# Patient Record
Sex: Female | Born: 1945 | Race: White | Hispanic: No | Smoking: Former smoker
Health system: Southern US, Community
[De-identification: ages and names within clinical notes are randomized; demographics above are authoritative.]

## PROBLEM LIST (undated history)

## (undated) DIAGNOSIS — G459 Transient cerebral ischemic attack, unspecified: Secondary | ICD-10-CM

## (undated) DIAGNOSIS — J449 Chronic obstructive pulmonary disease, unspecified: Secondary | ICD-10-CM

## (undated) DIAGNOSIS — M1990 Unspecified osteoarthritis, unspecified site: Secondary | ICD-10-CM

## (undated) DIAGNOSIS — E079 Disorder of thyroid, unspecified: Secondary | ICD-10-CM

## (undated) DIAGNOSIS — E119 Type 2 diabetes mellitus without complications: Secondary | ICD-10-CM

## (undated) DIAGNOSIS — E1142 Type 2 diabetes mellitus with diabetic polyneuropathy: Secondary | ICD-10-CM

## (undated) DIAGNOSIS — N289 Disorder of kidney and ureter, unspecified: Secondary | ICD-10-CM

## (undated) DIAGNOSIS — E1140 Type 2 diabetes mellitus with diabetic neuropathy, unspecified: Secondary | ICD-10-CM

## (undated) DIAGNOSIS — N186 End stage renal disease: Secondary | ICD-10-CM

## (undated) DIAGNOSIS — Z9981 Dependence on supplemental oxygen: Secondary | ICD-10-CM

## (undated) DIAGNOSIS — G4730 Sleep apnea, unspecified: Secondary | ICD-10-CM

## (undated) HISTORY — DX: Unspecified osteoarthritis, unspecified site: M19.90

## (undated) HISTORY — DX: Type 2 diabetes mellitus with diabetic polyneuropathy: E11.42

## (undated) HISTORY — DX: Disorder of thyroid, unspecified: E07.9

## (undated) HISTORY — PX: ABDOMINAL HYSTERECTOMY: SHX81

## (undated) HISTORY — PX: RECTOCELE REPAIR: SHX761

## (undated) HISTORY — PX: VAGINAL WOUND CLOSURE / REPAIR: SUR258

## (undated) HISTORY — PX: THYROIDECTOMY: SHX17

---

## 2006-01-26 HISTORY — PX: REPLACEMENT TOTAL KNEE BILATERAL: SUR1225

## 2018-03-25 DIAGNOSIS — N2581 Secondary hyperparathyroidism of renal origin: Secondary | ICD-10-CM | POA: Insufficient documentation

## 2018-03-25 DIAGNOSIS — D631 Anemia in chronic kidney disease: Secondary | ICD-10-CM | POA: Insufficient documentation

## 2018-03-25 DIAGNOSIS — D509 Iron deficiency anemia, unspecified: Secondary | ICD-10-CM | POA: Insufficient documentation

## 2018-03-30 ENCOUNTER — Encounter (HOSPITAL_COMMUNITY): Payer: Self-pay | Admitting: *Deleted

## 2018-03-30 ENCOUNTER — Other Ambulatory Visit: Payer: Self-pay

## 2018-03-30 ENCOUNTER — Emergency Department (HOSPITAL_COMMUNITY)
Admission: EM | Admit: 2018-03-30 | Discharge: 2018-03-30 | Disposition: A | Discharge status: Home / Self Care | Payer: Medicare Other | Attending: Emergency Medicine | Admitting: Emergency Medicine

## 2018-03-30 DIAGNOSIS — Z992 Dependence on renal dialysis: Secondary | ICD-10-CM | POA: Diagnosis not present

## 2018-03-30 DIAGNOSIS — J449 Chronic obstructive pulmonary disease, unspecified: Secondary | ICD-10-CM | POA: Insufficient documentation

## 2018-03-30 DIAGNOSIS — N186 End stage renal disease: Secondary | ICD-10-CM | POA: Insufficient documentation

## 2018-03-30 DIAGNOSIS — E119 Type 2 diabetes mellitus without complications: Secondary | ICD-10-CM | POA: Insufficient documentation

## 2018-03-30 DIAGNOSIS — Z87891 Personal history of nicotine dependence: Secondary | ICD-10-CM | POA: Insufficient documentation

## 2018-03-30 DIAGNOSIS — Z9981 Dependence on supplemental oxygen: Secondary | ICD-10-CM

## 2018-03-30 HISTORY — DX: Type 2 diabetes mellitus without complications: E11.9

## 2018-03-30 HISTORY — DX: Disorder of kidney and ureter, unspecified: N28.9

## 2018-03-30 HISTORY — DX: Dependence on supplemental oxygen: Z99.81

## 2018-03-30 HISTORY — DX: Chronic obstructive pulmonary disease, unspecified: J44.9

## 2018-03-30 LAB — CBC WITH DIFFERENTIAL/PLATELET
Abs Immature Granulocytes: 0.05 10*3/uL (ref 0.00–0.07)
Basophils Absolute: 0 10*3/uL (ref 0.0–0.1)
Basophils Relative: 0 %
Eosinophils Absolute: 0.2 10*3/uL (ref 0.0–0.5)
Eosinophils Relative: 2 %
HCT: 32.9 % — ABNORMAL LOW (ref 36.0–46.0)
Hemoglobin: 10 g/dL — ABNORMAL LOW (ref 12.0–15.0)
Immature Granulocytes: 1 %
Lymphocytes Relative: 14 %
Lymphs Abs: 1.3 10*3/uL (ref 0.7–4.0)
MCH: 31.9 pg (ref 26.0–34.0)
MCHC: 30.4 g/dL (ref 30.0–36.0)
MCV: 105.1 fL — AB (ref 80.0–100.0)
Monocytes Absolute: 0.5 10*3/uL (ref 0.1–1.0)
Monocytes Relative: 5 %
Neutro Abs: 7.2 10*3/uL (ref 1.7–7.7)
Neutrophils Relative %: 78 %
Platelets: 175 10*3/uL (ref 150–400)
RBC: 3.13 MIL/uL — ABNORMAL LOW (ref 3.87–5.11)
RDW: 13.2 % (ref 11.5–15.5)
WBC: 9.2 10*3/uL (ref 4.0–10.5)
nRBC: 0 % (ref 0.0–0.2)

## 2018-03-30 LAB — COMPREHENSIVE METABOLIC PANEL
ALT: 14 U/L (ref 0–44)
AST: 11 U/L — AB (ref 15–41)
Albumin: 3.2 g/dL — ABNORMAL LOW (ref 3.5–5.0)
Alkaline Phosphatase: 128 U/L — ABNORMAL HIGH (ref 38–126)
Anion gap: 16 — ABNORMAL HIGH (ref 5–15)
BUN: 46 mg/dL — ABNORMAL HIGH (ref 8–23)
CO2: 22 mmol/L (ref 22–32)
CREATININE: 8.12 mg/dL — AB (ref 0.44–1.00)
Calcium: 8.4 mg/dL — ABNORMAL LOW (ref 8.9–10.3)
Chloride: 101 mmol/L (ref 98–111)
GFR calc Af Amer: 5 mL/min — ABNORMAL LOW (ref >60)
GFR, EST NON AFRICAN AMERICAN: 4 mL/min — AB (ref >60)
Glucose, Bld: 148 mg/dL — ABNORMAL HIGH (ref 70–99)
Potassium: 4.6 mmol/L (ref 3.5–5.1)
Sodium: 139 mmol/L (ref 135–145)
Total Bilirubin: 0.9 mg/dL (ref 0.3–1.2)
Total Protein: 6.5 g/dL (ref 6.5–8.1)

## 2018-03-30 NOTE — ED Notes (Signed)
The pt is a dialysis pt that just moved  Here from  [place]   She is moving here  She was dialyzed in [place] yesterday.  She has a diakysis catheter in her rt upper chest.  She was sent here  To be seen by the edp for  A referral tO a dialysis   Center????

## 2018-03-30 NOTE — ED Provider Notes (Signed)
[facility] EMERGENCY DEPARTMENT Provider Note   CSN: [id number] Arrival date & time: 03/30/18  1322    History   Chief Complaint Chief Complaint  Patient presents with  . Vascular Access Problem    HPI Joan Mosley is a 73 y.o. female.     Patient with history of diabetes, end-stage renal disease, and COPD on home oxygen.  She presents with complaints of a need for dialysis.  Patient moved here from [place] this morning.  She tells me she has an appointment tomorrow to go to [facility]  for her dialysis, but was told she needed to be seen by a physician first.  She presents here for screening and evaluation.  She has no specific complaints.  Her last dialysis was Monday in [place].  The history is provided by the patient.    Past Medical History:  Diagnosis Date  . COPD (chronic obstructive pulmonary disease) ([place])   . Diabetes mellitus without complication ([place])   . Oxygen dependent 03/30/2018   PT uses O2  all the time  . Renal disorder    ESRD    There are no active problems to display for this patient.   History reviewed. No pertinent surgical history.   OB History   No obstetric history on file.      Home Medications    Prior to Admission medications   Not on File    Family History History reviewed. No pertinent family history.  Social History Social History   Tobacco Use  . Smoking status: Former Smoker    Last attempt to quit: 03/29/1981    Years since quitting: 37.0  . Smokeless tobacco: Never Used  Substance Use Topics  . Alcohol use: Never    Frequency: Never  . Drug use: Never     Allergies   Avelox [moxifloxacin hcl in nacl]; Codeine; Other; Penicillins; and Sulfa antibiotics   Review of Systems Review of Systems  All other systems reviewed and are negative.    Physical Exam Updated Vital Signs BP (!) 119/100 (BP Location: Right Arm)   Pulse 84   Temp 98.1 F (36.7 C) (Oral)    Resp 20   Ht 5' 1.25" (1.556 m)   Wt 110.2 kg   SpO2 100%   BMI 45.54 kg/m   Physical Exam Vitals signs and nursing note reviewed.  Constitutional:      General: She is not in acute distress.    Appearance: She is well-developed. She is not diaphoretic.  HENT:     Head: Normocephalic and atraumatic.  Neck:     Musculoskeletal: Normal range of motion and neck supple.  Cardiovascular:     Rate and Rhythm: Normal rate and regular rhythm.     Heart sounds: No murmur. No friction rub. No gallop.   Pulmonary:     Effort: Pulmonary effort is normal. No respiratory distress.     Breath sounds: Normal breath sounds. No wheezing.  Abdominal:     General: Bowel sounds are normal. There is no distension.     Palpations: Abdomen is soft.     Tenderness: There is no abdominal tenderness.  Musculoskeletal: Normal range of motion.  Skin:    General: Skin is warm and dry.  Neurological:     Mental Status: She is alert and oriented to person, place, and time.      ED Treatments / Results  Labs (all labs ordered are listed, but only abnormal results are displayed) Labs  Reviewed  CBC WITH DIFFERENTIAL/PLATELET - Abnormal; Notable for the following components:      Result Value   RBC 3.13 (*)    Hemoglobin 10.0 (*)    HCT 32.9 (*)    MCV 105.1 (*)    All other components within normal limits  COMPREHENSIVE METABOLIC PANEL - Abnormal; Notable for the following components:   Glucose, Bld 148 (*)    BUN 46 (*)    Creatinine, Ser 8.12 (*)    Calcium 8.4 (*)    Albumin 3.2 (*)    AST 11 (*)    Alkaline Phosphatase 128 (*)    GFR calc non Af Amer 4 (*)    GFR calc Af Amer 5 (*)    Anion gap 16 (*)    All other components within normal limits    EKG None  Radiology No results found.  Procedures Procedures (including critical care time)  Medications Ordered in ED Medications - No data to display   Initial Impression / Assessment and Plan / ED Course  I have reviewed the  triage vital signs and the nursing notes.  Pertinent labs & imaging results that were available during my care of the patient were reviewed by me and considered in my medical decision making (see chart for details).  Patient presents here to establish dialysis care.  She does not appear to be in need of emergent dialysis.  Potassium is normal and she does not appear to be volume overloaded on exam.  The patient has no complaints.  I spoke with Dr. Augustin Coupe from nephrology.  He called and spoke with the medical director of the for [facility] dialysis center.  He tells me that they are expecting her.  She should call in the morning between 630 and 7 to get a time set up for her to come in.  Final Clinical Impressions(s) / ED Diagnoses   Final diagnoses:  None    ED Discharge Orders    None       Veryl Speak, MD 03/30/18 1753

## 2018-03-30 NOTE — Discharge Instructions (Addendum)
Call the [facility] dialysis center in the morning between 630 and 7 AM to arrange a time for your dialysis.

## 2018-03-30 NOTE — ED Triage Notes (Signed)
PT moved new to area from the [place]. Pt last  Dialysis was on Monday before Move to [facility]

## 2018-03-30 NOTE — ED Notes (Signed)
Pt alert oriented skin warm and dry  No distress.

## 2018-03-30 NOTE — ED Triage Notes (Signed)
PLEASE CALL PT'S SON ERIC @ [phone]

## 2018-03-31 DIAGNOSIS — D689 Coagulation defect, unspecified: Secondary | ICD-10-CM | POA: Insufficient documentation

## 2018-04-05 DIAGNOSIS — R52 Pain, unspecified: Secondary | ICD-10-CM | POA: Insufficient documentation

## 2018-04-05 DIAGNOSIS — L299 Pruritus, unspecified: Secondary | ICD-10-CM | POA: Insufficient documentation

## 2018-04-05 DIAGNOSIS — R0602 Shortness of breath: Secondary | ICD-10-CM | POA: Insufficient documentation

## 2018-04-05 DIAGNOSIS — I5032 Chronic diastolic (congestive) heart failure: Secondary | ICD-10-CM | POA: Insufficient documentation

## 2018-04-05 DIAGNOSIS — I639 Cerebral infarction, unspecified: Secondary | ICD-10-CM | POA: Insufficient documentation

## 2018-04-05 DIAGNOSIS — R197 Diarrhea, unspecified: Secondary | ICD-10-CM | POA: Insufficient documentation

## 2018-04-08 ENCOUNTER — Ambulatory Visit: Payer: Medicare Other | Admitting: Nurse Practitioner

## 2018-04-13 ENCOUNTER — Ambulatory Visit: Payer: Medicare Other | Admitting: Nurse Practitioner

## 2018-04-19 ENCOUNTER — Emergency Department (HOSPITAL_COMMUNITY): Payer: Medicare Other

## 2018-04-19 ENCOUNTER — Non-Acute Institutional Stay (HOSPITAL_COMMUNITY)
Admission: EM | Admit: 2018-04-19 | Discharge: 2018-04-20 | Disposition: A | Discharge status: Home / Self Care | Payer: Medicare Other | Attending: Internal Medicine | Admitting: Internal Medicine

## 2018-04-19 ENCOUNTER — Other Ambulatory Visit: Payer: Self-pay

## 2018-04-19 ENCOUNTER — Encounter (HOSPITAL_COMMUNITY): Payer: Self-pay | Admitting: Emergency Medicine

## 2018-04-19 DIAGNOSIS — Z992 Dependence on renal dialysis: Secondary | ICD-10-CM | POA: Diagnosis not present

## 2018-04-19 DIAGNOSIS — J449 Chronic obstructive pulmonary disease, unspecified: Secondary | ICD-10-CM | POA: Insufficient documentation

## 2018-04-19 DIAGNOSIS — Z880 Allergy status to penicillin: Secondary | ICD-10-CM | POA: Insufficient documentation

## 2018-04-19 DIAGNOSIS — N186 End stage renal disease: Secondary | ICD-10-CM | POA: Diagnosis not present

## 2018-04-19 DIAGNOSIS — E875 Hyperkalemia: Secondary | ICD-10-CM | POA: Diagnosis not present

## 2018-04-19 DIAGNOSIS — Z885 Allergy status to narcotic agent status: Secondary | ICD-10-CM | POA: Insufficient documentation

## 2018-04-19 DIAGNOSIS — R0602 Shortness of breath: Secondary | ICD-10-CM | POA: Diagnosis present

## 2018-04-19 DIAGNOSIS — Z882 Allergy status to sulfonamides status: Secondary | ICD-10-CM | POA: Diagnosis not present

## 2018-04-19 DIAGNOSIS — Z87891 Personal history of nicotine dependence: Secondary | ICD-10-CM | POA: Diagnosis not present

## 2018-04-19 DIAGNOSIS — Z9981 Dependence on supplemental oxygen: Secondary | ICD-10-CM | POA: Insufficient documentation

## 2018-04-19 DIAGNOSIS — E1122 Type 2 diabetes mellitus with diabetic chronic kidney disease: Secondary | ICD-10-CM | POA: Diagnosis not present

## 2018-04-19 DIAGNOSIS — R5381 Other malaise: Secondary | ICD-10-CM

## 2018-04-19 LAB — CBC WITH DIFFERENTIAL/PLATELET
Abs Immature Granulocytes: 0.04 10*3/uL (ref 0.00–0.07)
BASOS ABS: 0 10*3/uL (ref 0.0–0.1)
Basophils Relative: 0 %
EOS PCT: 4 %
Eosinophils Absolute: 0.4 10*3/uL (ref 0.0–0.5)
HEMATOCRIT: 34.4 % — AB (ref 36.0–46.0)
Hemoglobin: 10.6 g/dL — ABNORMAL LOW (ref 12.0–15.0)
Immature Granulocytes: 0 %
Lymphocytes Relative: 23 %
Lymphs Abs: 2.2 10*3/uL (ref 0.7–4.0)
MCH: 32.2 pg (ref 26.0–34.0)
MCHC: 30.8 g/dL (ref 30.0–36.0)
MCV: 104.6 fL — ABNORMAL HIGH (ref 80.0–100.0)
Monocytes Absolute: 0.7 10*3/uL (ref 0.1–1.0)
Monocytes Relative: 8 %
Neutro Abs: 6.2 10*3/uL (ref 1.7–7.7)
Neutrophils Relative %: 65 %
Platelets: 137 10*3/uL — ABNORMAL LOW (ref 150–400)
RBC: 3.29 MIL/uL — ABNORMAL LOW (ref 3.87–5.11)
RDW: 13.5 % (ref 11.5–15.5)
WBC: 9.6 10*3/uL (ref 4.0–10.5)
nRBC: 0 % (ref 0.0–0.2)

## 2018-04-19 LAB — BASIC METABOLIC PANEL
Anion gap: 18 — ABNORMAL HIGH (ref 5–15)
BUN: 53 mg/dL — ABNORMAL HIGH (ref 8–23)
CO2: 23 mmol/L (ref 22–32)
Calcium: 9 mg/dL (ref 8.9–10.3)
Chloride: 97 mmol/L — ABNORMAL LOW (ref 98–111)
Creatinine, Ser: 9.55 mg/dL — ABNORMAL HIGH (ref 0.44–1.00)
GFR calc Af Amer: 4 mL/min — ABNORMAL LOW (ref >60)
GFR calc non Af Amer: 4 mL/min — ABNORMAL LOW (ref >60)
Glucose, Bld: 165 mg/dL — ABNORMAL HIGH (ref 70–99)
Potassium: 6.5 mmol/L (ref 3.5–5.1)
Sodium: 138 mmol/L (ref 135–145)

## 2018-04-19 LAB — TROPONIN I: Troponin I: <0.03 ng/mL (ref <0.03)

## 2018-04-19 LAB — INFLUENZA PANEL BY PCR (TYPE A & B)
Influenza A By PCR: NEGATIVE
Influenza B By PCR: NEGATIVE

## 2018-04-19 MED ORDER — INSULIN ASPART 100 UNIT/ML IV SOLN
5.0000 [IU] | Freq: Once | INTRAVENOUS | Status: AC
  Administered 2018-04-20: 5 [IU] via INTRAVENOUS

## 2018-04-19 MED ORDER — DEXTROSE 50 % IV SOLN
1.0000 | Freq: Once | INTRAVENOUS | Status: AC
  Administered 2018-04-20: 50 mL via INTRAVENOUS
  Filled 2018-04-19: qty 50

## 2018-04-19 NOTE — Discharge Instructions (Signed)
You were seen in the ED for increased shortness of breath and malaise. Your potassium was high and you needed dialysis. It will be important to keep your appointments for dialysis and continue your regular medications. Followup with your doctor and return if any worsening symptoms.

## 2018-04-19 NOTE — ED Provider Notes (Signed)
[facility] EMERGENCY DEPARTMENT Provider Note   CSN: [id number] Arrival date & time: 04/19/18  2128    History   Chief Complaint Chief Complaint  Patient presents with  . Shortness of Breath  . Weakness    HPI Joan Mosley is a 73 y.o. female.  She has a history of COPD and is on 5 L nasal cannula.  She also has end-stage renal disease and has been getting dialysis since last summer through a right tunneled catheter.  She moved here from [place] about 3 weeks ago and has been getting dialysis done here Tuesday Thursday Saturday.  She said she did not go today because she did not feel good.  There was also a car issue where they had a flat tire.  Tonight she started experiencing some heaviness in her chest and felt short of breath.  No cough no fever.  She also said she has been urinating frequently which is new for her     The history is provided by the patient.  Shortness of Breath  Severity:  Moderate Onset quality:  Gradual Timing:  Constant Progression:  Worsening Chronicity:  New Relieved by:  None tried Worsened by:  Nothing Ineffective treatments:  None tried Associated symptoms: chest pain   Associated symptoms: no abdominal pain (distention), no diaphoresis, no fever, no headaches, no neck pain, no rash, no sore throat, no sputum production and no vomiting   Weakness  Associated symptoms: chest pain, dysuria, frequency and shortness of breath   Associated symptoms: no abdominal pain (distention), no fever, no headaches, no nausea and no vomiting     Past Medical History:  Diagnosis Date  . COPD (chronic obstructive pulmonary disease) ([place])   . Diabetes mellitus without complication ([place])   . Oxygen dependent 03/30/2018   PT uses O2  all the time  . Renal disorder    ESRD    There are no active problems to display for this patient.   History reviewed. No pertinent surgical history.   OB History   No obstetric history on file.     Home Medications    Prior to Admission medications   Not on File    Family History No family history on file.  Social History Social History   Tobacco Use  . Smoking status: Former Smoker    Last attempt to quit: 03/29/1981    Years since quitting: 37.0  . Smokeless tobacco: Never Used  Substance Use Topics  . Alcohol use: Never    Frequency: Never  . Drug use: Never     Allergies   Avelox [moxifloxacin hcl in nacl]; Codeine; Other; Penicillins; and Sulfa antibiotics   Review of Systems Review of Systems  Constitutional: Negative for diaphoresis and fever.  HENT: Negative for sore throat.   Eyes: Negative for visual disturbance.  Respiratory: Positive for shortness of breath. Negative for sputum production.   Cardiovascular: Positive for chest pain.  Gastrointestinal: Positive for abdominal distention. Negative for abdominal pain (distention), nausea and vomiting.  Genitourinary: Positive for dysuria and frequency.  Musculoskeletal: Negative for neck pain.  Skin: Negative for rash.  Neurological: Positive for weakness. Negative for headaches.     Physical Exam Updated Vital Signs BP (!) 151/74 (BP Location: Right Arm)   Pulse 85   Temp 98.6 F (37 C) (Oral)   Resp (!) 22   Ht 5\' 1"  (1.549 m)   Wt 112 kg   SpO2 100%   BMI 46.67 kg/m  Physical Exam Vitals signs and nursing note reviewed.  Constitutional:      General: She is not in acute distress.    Appearance: She is well-developed.  HENT:     Head: Normocephalic and atraumatic.  Eyes:     Conjunctiva/sclera: Conjunctivae normal.  Neck:     Musculoskeletal: Neck supple.  Cardiovascular:     Rate and Rhythm: Normal rate and regular rhythm.     Heart sounds: No murmur.  Pulmonary:     Effort: Pulmonary effort is normal. No respiratory distress.     Breath sounds: Normal breath sounds.     Comments: She is a catheter in her right upper chest no surrounding erythema. Abdominal:      Palpations: Abdomen is soft.     Tenderness: There is no abdominal tenderness.  Musculoskeletal:     Right lower leg: Edema present.     Left lower leg: Edema present.  Skin:    General: Skin is warm and dry.     Capillary Refill: Capillary refill takes less than 2 seconds.  Neurological:     General: No focal deficit present.     Mental Status: She is alert and oriented to person, place, and time.      ED Treatments / Results  Labs (all labs ordered are listed, but only abnormal results are displayed) Labs Reviewed  CBC WITH DIFFERENTIAL/PLATELET - Abnormal; Notable for the following components:      Result Value   RBC 3.29 (*)    Hemoglobin 10.6 (*)    HCT 34.4 (*)    MCV 104.6 (*)    Platelets 137 (*)    All other components within normal limits  BASIC METABOLIC PANEL - Abnormal; Notable for the following components:   Potassium 6.5 (*)    Chloride 97 (*)    Glucose, Bld 165 (*)    BUN 53 (*)    Creatinine, Ser 9.55 (*)    GFR calc non Af Amer 4 (*)    GFR calc Af Amer 4 (*)    Anion gap 18 (*)    All other components within normal limits  URINALYSIS, ROUTINE W REFLEX MICROSCOPIC - Abnormal; Notable for the following components:   APPearance HAZY (*)    pH 9.0 (*)    Glucose, UA 150 (*)    Hgb urine dipstick MODERATE (*)    Protein, ur 100 (*)    All other components within normal limits  CULTURE, BLOOD (ROUTINE X 2)  CULTURE, BLOOD (ROUTINE X 2)  URINE CULTURE  TROPONIN I  INFLUENZA PANEL BY PCR (TYPE A & B)  GLUCOSE, RANDOM  CBG MONITORING, ED    EKG EKG Interpretation  Date/Time:  Tuesday April 19 2018 21:35:19 EDT Ventricular Rate:  87 PR Interval:    QRS Duration: 94 QT Interval:  384 QTC Calculation: 462 R Axis:   -15 Text Interpretation:  Sinus rhythm Borderline left axis deviation no prior to compare with Confirmed by Aletta Edouard [phone]) on 04/19/2018 9:59:45 PM Also confirmed by Aletta Edouard [phone]), editor Philomena Doheny [phone])  on  04/20/2018 7:13:26 AM   Radiology Dg Chest Port 1 View  Result Date: 04/19/2018 CLINICAL DATA:  73 year old female with shortness of breath. EXAM: PORTABLE CHEST 1 VIEW COMPARISON:  None. FINDINGS: Portable AP upright view at 2140 hours. Right chest tunneled type dual lumen dialysis catheter in place with tips at the lower SVC and right atrium. Patient is mildly rotated to the right. Large lung volumes.  No overt edema. No pneumothorax. No pleural effusion or consolidation identified. Visualized tracheal air column is within normal limits. Calcified aortic atherosclerosis. Other mediastinal contours are within normal limits. No acute osseous abnormality identified. IMPRESSION: 1. No acute cardiopulmonary abnormality identified. Pulmonary hyperinflation. 2. Right chest dialysis type catheter in place. 3.  Aortic Atherosclerosis (ICD10-I70.0). Electronically Signed   By: Genevie Ann M.D.   On: 04/19/2018 22:07    Procedures .Critical Care Performed by: Hayden Rasmussen, MD Authorized by: Hayden Rasmussen, MD   Critical care provider statement:    Critical care time (minutes):  45   Critical care time was exclusive of:  Separately billable procedures and treating other patients   Critical care was necessary to treat or prevent imminent or life-threatening deterioration of the following conditions:  Metabolic crisis   Critical care was time spent personally by me on the following activities:  Discussions with consultants, evaluation of patient's response to treatment, examination of patient, ordering and performing treatments and interventions, ordering and review of laboratory studies, ordering and review of radiographic studies, pulse oximetry, re-evaluation of patient's condition, obtaining history from patient or surrogate, review of old charts and development of treatment plan with patient or surrogate   I assumed direction of critical care for this patient from another provider in my specialty: no      (including critical care time)  Medications Ordered in ED Medications  Chlorhexidine Gluconate Cloth 2 % PADS 6 each (has no administration in time range)  0.9 %  sodium chloride infusion (has no administration in time range)  0.9 %  sodium chloride infusion (has no administration in time range)  heparin injection 1,000 Units (has no administration in time range)  alteplase (CATHFLO ACTIVASE) injection 2 mg (has no administration in time range)  midodrine (PROAMATINE) 5 MG tablet (has no administration in time range)  clonazePAM (KLONOPIN) 0.5 MG tablet (has no administration in time range)  heparin 1000 UNIT/ML injection (has no administration in time range)  insulin aspart (novoLOG) injection 5 Units (5 Units Intravenous Given 04/20/18 0032)    And  dextrose 50 % solution 50 mL (50 mLs Intravenous Given 04/20/18 0033)  midodrine (PROAMATINE) tablet 10 mg (10 mg Oral Given 04/20/18 0431)     Initial Impression / Assessment and Plan / ED Course  I have reviewed the triage vital signs and the nursing notes.  Pertinent labs & imaging results that were available during my care of the patient were reviewed by me and considered in my medical decision making (see chart for details).  Clinical Course as of Apr 19 1224  Tue Apr 19, 2018  2152 Differential diagnosis includes fluid overload, ACS, pneumothorax, PE.    [MB]  2301 Patient's potassium resulted at 6.5.  She is also requiring more oxygen than her baseline 5 L.  I placed a call into nephrology regarding dialysis.  She is good no EKG changes and holding off on starting on insulin and glucose and waiting to see what nephrology wants to do.   [MB]  2318 Discussed with Dr Johnney Ou nephrology who will have the patient get dialysis tonight.    [MB]    Clinical Course User Index [MB] Hayden Rasmussen, MD       Final Clinical Impressions(s) / ED Diagnoses   Final diagnoses:  Hyperkalemia  Shortness of breath  [facility]    ED  Discharge Orders    None       Hayden Rasmussen, MD 04/20/18  1227  

## 2018-04-19 NOTE — ED Triage Notes (Signed)
Per EMS, Pt from home w/ family.  Missed dialysis, was feeling "bad", there was also some car issues (new to area, might need resources).  [place] 10 [place].  Pt is normally on 5L O2 at home, was 93%.  Also reports urinary frequency w/o pain.  Has a dry cough.  Last dialysis was Saturday.

## 2018-04-20 DIAGNOSIS — E875 Hyperkalemia: Secondary | ICD-10-CM | POA: Diagnosis present

## 2018-04-20 LAB — URINALYSIS, ROUTINE W REFLEX MICROSCOPIC
BILIRUBIN URINE: NEGATIVE
Bacteria, UA: NONE SEEN
Glucose, UA: 150 mg/dL — AB
Ketones, ur: NEGATIVE mg/dL
Leukocytes,Ua: NEGATIVE
Nitrite: NEGATIVE
Protein, ur: 100 mg/dL — AB
SPECIFIC GRAVITY, URINE: 1.009 (ref 1.005–1.030)
pH: 9 — ABNORMAL HIGH (ref 5.0–8.0)

## 2018-04-20 LAB — CBG MONITORING, ED: Glucose-Capillary: 76 mg/dL (ref 70–99)

## 2018-04-20 MED ORDER — MIDODRINE HCL 5 MG PO TABS
10.0000 mg | ORAL_TABLET | ORAL | Status: AC
  Administered 2018-04-20 (×2): 10 mg via ORAL
  Filled 2018-04-20 (×2): qty 2

## 2018-04-20 MED ORDER — SODIUM CHLORIDE 0.9 % IV SOLN: 100.0000 mL | INTRAVENOUS | Status: DC | PRN

## 2018-04-20 MED ORDER — ALTEPLASE 2 MG IJ SOLR: 2.0000 mg | Freq: Once | INTRAMUSCULAR | Status: DC | PRN

## 2018-04-20 MED ORDER — HEPARIN SODIUM (PORCINE) 1000 UNIT/ML DIALYSIS: 1000.0000 [IU] | INTRAMUSCULAR | Status: DC | PRN

## 2018-04-20 MED ORDER — CHLORHEXIDINE GLUCONATE CLOTH 2 % EX PADS: 6.0000 | MEDICATED_PAD | Freq: Every day | CUTANEOUS | Status: DC

## 2018-04-20 MED ORDER — HEPARIN SODIUM (PORCINE) 1000 UNIT/ML IJ SOLN
INTRAMUSCULAR | Status: AC
  Filled 2018-04-20: qty 4

## 2018-04-20 MED ORDER — CLONAZEPAM 0.5 MG PO TABS
ORAL_TABLET | ORAL | Status: AC
  Filled 2018-04-20: qty 2

## 2018-04-20 MED ORDER — MIDODRINE HCL 5 MG PO TABS
ORAL_TABLET | ORAL | Status: AC
  Filled 2018-04-20: qty 2

## 2018-04-20 NOTE — Consult Note (Addendum)
[facility]  Reason for Consultation: hyperkalemia, missed dialysis Requesting Provider: Dr. Melina Copa  HPI: Joan Mosley is an 73 y.o. female with ESRD on HD TTS, COPD on 5L O2 at baseline, DM who is seen for evaluation and management of hyperkalemia and dyspnea in setting of missed dialysis.   Has been on HD since 06/2017 but recently moved to area about 3 weeks ago.  Dialyzes at [facility].  Reports to me that this AM feeling "bad" with loose stools and ride to HD had a flat tire so the combination led to missed dialysis.  She presented to the ED this evening with dyspnea, orthopnea.  Labs showing K 6.5 without EKG changes.  She desires dialysis and if able would like to be discharged after dialysis.  She is afebrile, denies cough to me (per ED RN note reported dry cough), denies myalgias.  Per report prior to entire history being obtained a flu swab was obtained and is pending, but there is low suspicion for etiology other than vol overload in context of missed HD.    Missed 3/19 and 3/24.  EDW 105kg. Post wt 3/21 was 114.2kg.  Has been 4+kg over EDW after each treatment except 3/5 when she was 105.8kg  PMH: Past Medical History:  Diagnosis Date  . COPD (chronic obstructive pulmonary disease) ([place])   . Diabetes mellitus without complication ([place])   . Oxygen dependent 03/30/2018   PT uses O2  all the time  . Renal disorder    ESRD   PSH: History reviewed. No pertinent surgical history.  DIALYSIS: Dialyzes at [facility] on TTS since 03/31/18 EDW 105kg. HD [place] 2/2, Dialyzer 180, 4hrs BFR 400, DFR 800. Heparin none. Access RIJ TDC. mircera 100 q2wks, calcitriol 1 qtx   Past Medical History:  Diagnosis Date  . COPD (chronic obstructive pulmonary disease) ([place])   . Diabetes mellitus without complication ([place])   . Oxygen dependent 03/30/2018   PT uses O2  all the time  . Renal disorder    ESRD    Medications:  I have reviewed the patient's current  medications.  (Not in a hospital admission)   ALLERGIES:   Allergies  Allergen Reactions  . Avelox [Moxifloxacin Hcl In Nacl]     PT does not remember  . Codeine Anaphylaxis    PT does not remember  . Other     PT does not remember  . Penicillins     Rash  . Sulfa Antibiotics     PT does nolt remember    FAM HX: No family history on file.  Social History:   reports that she quit smoking about 37 years ago. She has never used smokeless tobacco. She reports that she does not drink alcohol or use drugs.  ROS: 12 system ROS neg except per HPI above  Blood pressure (!) 151/74, pulse 85, temperature 98.6 F (37 C), temperature source Oral, resp. rate (!) 22, height 5\' 1"  (1.549 m), weight 112 kg, SpO2 100 %. PHYSICAL EXAM: Gen: chronically ill, nontoxic appearing  Eyes: anicteric, EOMI ENT: MMM Neck: JVD to 10 cm at 45 degrees CV:  RRR, no rub Abd: soft, obese, NABS Lungs: rales to 1/3 lung fields, 92% on 5L [place], speaking in full sentences GU: deferred Extr: 1+ pitting edema to knees Neuro: grossly nonfocal Skin: warm and dry   Results for orders placed or performed during the hospital encounter of 04/19/18 (from the past 48 hour(s))  CBC with Differential  Status: Abnormal   Collection Time: 04/19/18  9:39 PM  Result Value Ref Range   WBC 9.6 4.0 - 10.5 K/uL   RBC 3.29 (L) 3.87 - 5.11 MIL/uL   Hemoglobin 10.6 (L) 12.0 - 15.0 g/dL   HCT 34.4 (L) 36.0 - 46.0 %   MCV 104.6 (H) 80.0 - 100.0 fL   MCH 32.2 26.0 - 34.0 pg   MCHC 30.8 30.0 - 36.0 g/dL   RDW 13.5 11.5 - 15.5 %   Platelets 137 (L) 150 - 400 K/uL   nRBC 0.0 0.0 - 0.2 %   Neutrophils Relative % 65 %   Neutro Abs 6.2 1.7 - 7.7 K/uL   Lymphocytes Relative 23 %   Lymphs Abs 2.2 0.7 - 4.0 K/uL   Monocytes Relative 8 %   Monocytes Absolute 0.7 0.1 - 1.0 K/uL   Eosinophils Relative 4 %   Eosinophils Absolute 0.4 0.0 - 0.5 K/uL   Basophils Relative 0 %   Basophils Absolute 0.0 0.0 - 0.1 K/uL   Immature  Granulocytes 0 %   Abs Immature Granulocytes 0.04 0.00 - 0.07 K/uL    Comment: Performed at [facility], [address], [place], [place] [address]  Basic metabolic panel     Status: Abnormal   Collection Time: 04/19/18  9:39 PM  Result Value Ref Range   Sodium 138 135 - 145 mmol/L   Potassium 6.5 (HH) 3.5 - 5.1 mmol/L    Comment: CRITICAL RESULT CALLED TO, READ BACK BY AND VERIFIED WITH: GLOUSTER,J RN 04/19/2018 2234 [place] NO VISIBLE HEMOLYSIS    Chloride 97 (L) 98 - 111 mmol/L   CO2 23 22 - 32 mmol/L   Glucose, Bld 165 (H) 70 - 99 mg/dL   BUN 53 (H) 8 - 23 mg/dL   Creatinine, Ser 9.55 (H) 0.44 - 1.00 mg/dL   Calcium 9.0 8.9 - 10.3 mg/dL   GFR calc non Af Amer 4 (L) >60 mL/min   GFR calc Af Amer 4 (L) >60 mL/min   Anion gap 18 (H) 5 - 15    Comment: Performed at [facility], [place] [address] [place], [place] [address]  Troponin I - Once     Status: None   Collection Time: 04/19/18  9:39 PM  Result Value Ref Range   Troponin I <0.03 <0.03 ng/mL    Comment: Performed at [facility] [address], [place], [place] [address]  Influenza panel by PCR (type A & B)     Status: None   Collection Time: 04/19/18  9:51 PM  Result Value Ref Range   Influenza A By PCR NEGATIVE NEGATIVE   Influenza B By PCR NEGATIVE NEGATIVE    Comment: (NOTE) The Xpert Xpress Flu assay is intended as an aid in the diagnosis of  influenza and should not be used as a sole basis for treatment.  This  assay is FDA approved for nasopharyngeal swab specimens only. Nasal  washings and aspirates are unacceptable for Xpert Xpress Flu testing. Performed at [facility], [place] [address], [place],  [address]     Dg Chest Port 1 View  Result Date: 04/19/2018 CLINICAL DATA:  73 year old female with shortness of breath. EXAM: PORTABLE CHEST 1 VIEW COMPARISON:  None. FINDINGS: Portable AP upright view at 2140 hours. Right chest tunneled type dual lumen dialysis catheter in place  with tips at the lower SVC and right atrium. Patient is mildly rotated to the right.  Large lung volumes. No overt edema. No pneumothorax. No pleural effusion or consolidation identified. Visualized tracheal air column is within normal limits. Calcified aortic atherosclerosis. Other mediastinal contours are within normal limits. No acute osseous abnormality identified. IMPRESSION: 1. No acute cardiopulmonary abnormality identified. Pulmonary hyperinflation. 2. Right chest dialysis type catheter in place. 3.  Aortic Atherosclerosis (ICD10-I70.0). Electronically Signed   By: Genevie Ann M.D.   On: 04/19/2018 22:07    Assessment/Plan **ESRD with hyperkalemia and dyspnea in setting of missed dialysis:  HD tonight 4hrs, UF 4L as tolerated. 2K/2Ca. No heparin  Midodrine 10 preHD and mid tx.   Presumably her dyspnea is secondary to pulmonary edema, low suspicion for viral illness.  Plan is ED to discharge following HD.  She seems to need a more reliable outpt transportation plan.  Work towards EDW in [employer] setting.   **Anemia:  Hb 10.6, no issue  **BMM: no an acute issue; ca ok.   **COPD:  Currently on 5L O2 which is her home dose.    Justin Mend 04/20/2018, 12:15 AM

## 2018-04-21 ENCOUNTER — Telehealth (HOSPITAL_BASED_OUTPATIENT_CLINIC_OR_DEPARTMENT_OTHER): Payer: Self-pay | Admitting: Emergency Medicine

## 2018-04-21 LAB — URINE CULTURE

## 2018-04-23 LAB — CULTURE, BLOOD (ROUTINE X 2)

## 2018-04-24 ENCOUNTER — Telehealth: Payer: Self-pay | Admitting: Emergency Medicine

## 2018-04-24 LAB — CULTURE, BLOOD (ROUTINE X 2)
Culture: NO GROWTH
Special Requests: ADEQUATE

## 2018-04-24 NOTE — Telephone Encounter (Signed)
Post ED Visit - Positive Culture Follow-up  Culture report reviewed by antimicrobial stewardship pharmacist: [place] Team []  Elenor Quinones, Pharm.D. []  Heide Guile, Pharm.D., BCPS AQ-ID []  Parks Neptune, Pharm.D., BCPS []  Alycia Rossetti, Pharm.D., BCPS []  Nara Visa, Pharm.D., BCPS, AAHIVP []  Legrand Como, Pharm.D., BCPS, AAHIVP []  Salome Arnt, PharmD, BCPS []  Johnnette Gourd, PharmD, BCPS []  Hughes Better, PharmD, BCPS [x]  Leeroy Cha, PharmD []  Laqueta Linden, PharmD, BCPS []  Albertina Parr, PharmD  [place] Team []  Leodis Sias, PharmD []  Lindell Spar, PharmD []  Royetta Asal, PharmD []  Graylin Shiver, Rph []  Rema Fendt) Glennon Mac, PharmD []  Arlyn Dunning, PharmD []  Netta Cedars, PharmD []  Dia Sitter, PharmD []  Leone Haven, PharmD []  Gretta Arab, PharmD []  Theodis Shove, PharmD []  Peggyann Juba, PharmD []  Reuel Boom, PharmD   Positive blood culture Likely contaminant, no further patient follow-up is required at this time.  Larene Beach Meral Geissinger 04/24/2018, 4:42 PM

## 2018-04-25 ENCOUNTER — Ambulatory Visit: Payer: Medicare Other | Admitting: Nurse Practitioner

## 2018-05-18 ENCOUNTER — Other Ambulatory Visit: Payer: Self-pay

## 2018-05-18 ENCOUNTER — Emergency Department (HOSPITAL_COMMUNITY): Payer: Medicare Other

## 2018-05-18 ENCOUNTER — Emergency Department (HOSPITAL_COMMUNITY)
Admission: EM | Admit: 2018-05-18 | Discharge: 2018-05-18 | Disposition: A | Discharge status: Home / Self Care | Payer: Medicare Other | Attending: Emergency Medicine | Admitting: Emergency Medicine

## 2018-05-18 DIAGNOSIS — E1122 Type 2 diabetes mellitus with diabetic chronic kidney disease: Secondary | ICD-10-CM | POA: Diagnosis not present

## 2018-05-18 DIAGNOSIS — Z992 Dependence on renal dialysis: Secondary | ICD-10-CM | POA: Diagnosis not present

## 2018-05-18 DIAGNOSIS — N186 End stage renal disease: Secondary | ICD-10-CM | POA: Diagnosis not present

## 2018-05-18 DIAGNOSIS — R05 Cough: Secondary | ICD-10-CM | POA: Insufficient documentation

## 2018-05-18 DIAGNOSIS — J441 Chronic obstructive pulmonary disease with (acute) exacerbation: Secondary | ICD-10-CM | POA: Insufficient documentation

## 2018-05-18 DIAGNOSIS — R0602 Shortness of breath: Secondary | ICD-10-CM | POA: Diagnosis present

## 2018-05-18 LAB — BASIC METABOLIC PANEL
Anion gap: 15 (ref 5–15)
BUN: 27 mg/dL — ABNORMAL HIGH (ref 8–23)
CO2: 26 mmol/L (ref 22–32)
Calcium: 9.6 mg/dL (ref 8.9–10.3)
Chloride: 96 mmol/L — ABNORMAL LOW (ref 98–111)
Creatinine, Ser: 5.19 mg/dL — ABNORMAL HIGH (ref 0.44–1.00)
GFR calc Af Amer: 9 mL/min — ABNORMAL LOW (ref >60)
GFR calc non Af Amer: 8 mL/min — ABNORMAL LOW (ref >60)
Glucose, Bld: 125 mg/dL — ABNORMAL HIGH (ref 70–99)
Potassium: 5.4 mmol/L — ABNORMAL HIGH (ref 3.5–5.1)
Sodium: 137 mmol/L (ref 135–145)

## 2018-05-18 LAB — CBC WITH DIFFERENTIAL/PLATELET
Abs Immature Granulocytes: 0.03 10*3/uL (ref 0.00–0.07)
Basophils Absolute: 0.1 10*3/uL (ref 0.0–0.1)
Basophils Relative: 1 %
Eosinophils Absolute: 0.8 10*3/uL — ABNORMAL HIGH (ref 0.0–0.5)
Eosinophils Relative: 8 %
HCT: 38.6 % (ref 36.0–46.0)
Hemoglobin: 12.1 g/dL (ref 12.0–15.0)
Immature Granulocytes: 0 %
Lymphocytes Relative: 21 %
Lymphs Abs: 2.1 10*3/uL (ref 0.7–4.0)
MCH: 30.8 pg (ref 26.0–34.0)
MCHC: 31.3 g/dL (ref 30.0–36.0)
MCV: 98.2 fL (ref 80.0–100.0)
Monocytes Absolute: 0.8 10*3/uL (ref 0.1–1.0)
Monocytes Relative: 8 %
Neutro Abs: 6.2 10*3/uL (ref 1.7–7.7)
Neutrophils Relative %: 62 %
Platelets: 174 10*3/uL (ref 150–400)
RBC: 3.93 MIL/uL (ref 3.87–5.11)
RDW: 14.4 % (ref 11.5–15.5)
WBC: 9.9 10*3/uL (ref 4.0–10.5)
nRBC: 0 % (ref 0.0–0.2)

## 2018-05-18 MED ORDER — IPRATROPIUM-ALBUTEROL 0.5-2.5 (3) MG/3ML IN SOLN
3.0000 mL | Freq: Once | RESPIRATORY_TRACT | Status: AC
  Administered 2018-05-18: 3 mL via RESPIRATORY_TRACT
  Filled 2018-05-18: qty 3

## 2018-05-18 MED ORDER — METHYLPREDNISOLONE SODIUM SUCC 125 MG IJ SOLR
125.0000 mg | Freq: Once | INTRAMUSCULAR | Status: AC
  Administered 2018-05-18: 125 mg via INTRAVENOUS
  Filled 2018-05-18: qty 2

## 2018-05-18 MED ORDER — PREDNISONE 50 MG PO TABS: 50.0000 mg | ORAL_TABLET | Freq: Every day | ORAL | 0 refills | Status: DC

## 2018-05-18 NOTE — Discharge Instructions (Addendum)
Continue using your inhaler as needed.  Return if your breating is getting worse.

## 2018-05-18 NOTE — ED Triage Notes (Signed)
From home, pt has been experiencing on-going progressing shortness of breath. On 5 L @ home and dialysis schedule of T/Th/Sa. No issues with dialysis yesterday. However pt felt breathing was still becoming difficult tonight so called ems.

## 2018-05-18 NOTE — ED Provider Notes (Signed)
[facility] EMERGENCY DEPARTMENT Provider Note   CSN: [id number] Arrival date & time: 05/18/18  0205    History   Chief Complaint Chief Complaint  Patient presents with  . Shortness of Breath  . Cough    HPI Joan Mosley is a 73 y.o. female.   The history is provided by the patient.  She has a history diabetes, end-stage renal disease on hemodialysis, COPD and comes in because of shortness of breath and cough.  Symptoms are typical of her COPD exacerbations.  She started having a cough 2 days ago and started having difficulty breathing tonight.  Albuterol inhaler at home was giving her partial, temporary relief.  She denies fever, chills, sweats.  She denies any change in her usual generalized body aches.  She denies nausea, vomiting, diarrhea.  She denies fever or chills.  She denies exposure to anybody who has COVID-19.  She denies any recent travel.  Of note, she is normally on oxygen 5 L/min at home.  She did go for her usual dialysis yesterday.  Past Medical History:  Diagnosis Date  . COPD (chronic obstructive pulmonary disease) ([place])   . Diabetes mellitus without complication ([place])   . Oxygen dependent 03/30/2018   PT uses O2  all the time  . Renal disorder    ESRD    Patient Active Problem List   Diagnosis Date Noted  . Hyperkalemia 04/20/2018    No past surgical history on file.   OB History   No obstetric history on file.      Home Medications    Prior to Admission medications   Not on File    Family History No family history on file.  Social History Social History   Tobacco Use  . Smoking status: Former Smoker    Last attempt to quit: 03/29/1981    Years since quitting: 37.1  . Smokeless tobacco: Never Used  Substance Use Topics  . Alcohol use: Never    Frequency: Never  . Drug use: Never     Allergies   Avelox [moxifloxacin hcl in nacl]; Codeine; Other; Penicillins; and Sulfa antibiotics   Review of Systems  Review of Systems  All other systems reviewed and are negative.    Physical Exam Updated Vital Signs BP 124/71   Pulse 86   Temp 98.2 F (36.8 C) (Oral)   Resp 19   SpO2 95%   Physical Exam Vitals signs and nursing note reviewed.    73 year old female, resting comfortably and in no acute distress. Vital signs are normal. Oxygen saturation is 95%, which is normal. Head is normocephalic and atraumatic. PERRLA, EOMI. Oropharynx is clear. Neck is nontender and supple without adenopathy or JVD. Back is nontender and there is no CVA tenderness. Lungs have diminished airflow with scattered wheezes diffusely.  There are no rales or rhonchi. Chest is nontender.  Dialysis access catheters are present in the right subclavian area. Heart has regular rate and rhythm without murmur. Abdomen is soft, flat, nontender without masses or hepatosplenomegaly and peristalsis is normoactive. Extremities have 1+ edema, full range of motion is present. Skin is warm and dry without rash. Neurologic: Mental status is normal, cranial nerves are intact, there are no motor or sensory deficits.  ED Treatments / Results  Labs (all labs ordered are listed, but only abnormal results are displayed) Labs Reviewed  BASIC METABOLIC PANEL  CBC WITH DIFFERENTIAL/PLATELET    EKG EKG Interpretation  Date/Time:  Wednesday May 18 2018  02:14:21 EDT Ventricular Rate:  97 PR Interval:    QRS Duration: 92 QT Interval:  367 QTC Calculation: 467 R Axis:   -13 Text Interpretation:  Sinus rhythm Normal ECG When compared with ECG of 04/19/2018, No significant change was found Confirmed by Delora Fuel ([id number]) on 05/18/2018 2:27:57 AM   Radiology No results found.  Procedures Procedures (including critical care time)  Medications Ordered in ED Medications  ipratropium-albuterol (DUONEB) 0.5-2.5 (3) MG/3ML nebulizer solution 3 mL (has no administration in time range)  methylPREDNISolone sodium succinate  (SOLU-MEDROL) 125 mg/2 mL injection 125 mg (has no administration in time range)     Initial Impression / Assessment and Plan / ED Course  I have reviewed the triage vital signs and the nursing notes.  Pertinent labs & imaging results that were available during my care of the patient were reviewed by me and considered in my medical decision making (see chart for details).  COPD exacerbation.  No red flags to suggest COVID-19 infection.  Old records are reviewed, and she has no relevant past visits.  Will check chest x-ray and give steroids and nebulizer treatment with albuterol and ipratropium.  3:24 AM Chest x-ray shows no evidence of pneumonia.  Labs are reassuring.  She feels better after nebulizer treatment.  On reexam, there is still some wheezing.  She will be given a second nebulizer treatment.  3:56 AM Following second nebulizer treatment, she states that her breathing is back to baseline.  On exam, lungs are clear.  She is discharged with prescription for 5-day course of prednisone, and is referred to pulmonology.  Final Clinical Impressions(s) / ED Diagnoses   Final diagnoses:  COPD exacerbation ([place])  End-stage renal disease on hemodialysis [facility])    ED Discharge Orders         Ordered    Ambulatory referral to Pulmonology     05/18/18 0336    predniSONE (DELTASONE) 50 MG tablet  Daily     05/18/18 4627           Delora Fuel, MD 03/50/09 [phone]

## 2018-06-23 ENCOUNTER — Encounter: Payer: Self-pay | Admitting: Nephrology

## 2018-06-27 ENCOUNTER — Ambulatory Visit: Payer: Medicare Other | Admitting: Nurse Practitioner

## 2018-07-01 ENCOUNTER — Encounter: Payer: Self-pay | Admitting: Nurse Practitioner

## 2018-07-01 ENCOUNTER — Ambulatory Visit (INDEPENDENT_AMBULATORY_CARE_PROVIDER_SITE_OTHER): Payer: Medicare Other | Admitting: Nurse Practitioner

## 2018-07-01 VITALS — BP 134/90 | HR 88 | Temp 98.1°F | Ht 61.25 in | Wt 216.8 lb

## 2018-07-01 DIAGNOSIS — F419 Anxiety disorder, unspecified: Secondary | ICD-10-CM

## 2018-07-01 DIAGNOSIS — E039 Hypothyroidism, unspecified: Secondary | ICD-10-CM

## 2018-07-01 DIAGNOSIS — E1142 Type 2 diabetes mellitus with diabetic polyneuropathy: Secondary | ICD-10-CM

## 2018-07-01 DIAGNOSIS — Z13220 Encounter for screening for lipoid disorders: Secondary | ICD-10-CM

## 2018-07-01 DIAGNOSIS — Z1159 Encounter for screening for other viral diseases: Secondary | ICD-10-CM

## 2018-07-01 DIAGNOSIS — R2681 Unsteadiness on feet: Secondary | ICD-10-CM | POA: Insufficient documentation

## 2018-07-01 DIAGNOSIS — Z992 Dependence on renal dialysis: Secondary | ICD-10-CM

## 2018-07-01 DIAGNOSIS — I953 Hypotension of hemodialysis: Secondary | ICD-10-CM

## 2018-07-01 DIAGNOSIS — Z794 Long term (current) use of insulin: Secondary | ICD-10-CM

## 2018-07-01 DIAGNOSIS — Z789 Other specified health status: Secondary | ICD-10-CM | POA: Insufficient documentation

## 2018-07-01 DIAGNOSIS — Z136 Encounter for screening for cardiovascular disorders: Secondary | ICD-10-CM

## 2018-07-01 DIAGNOSIS — Z7189 Other specified counseling: Secondary | ICD-10-CM

## 2018-07-01 DIAGNOSIS — K5903 Drug induced constipation: Secondary | ICD-10-CM | POA: Insufficient documentation

## 2018-07-01 DIAGNOSIS — N186 End stage renal disease: Secondary | ICD-10-CM | POA: Insufficient documentation

## 2018-07-01 DIAGNOSIS — H9190 Unspecified hearing loss, unspecified ear: Secondary | ICD-10-CM | POA: Insufficient documentation

## 2018-07-01 DIAGNOSIS — Z8673 Personal history of transient ischemic attack (TIA), and cerebral infarction without residual deficits: Secondary | ICD-10-CM

## 2018-07-01 NOTE — Patient Instructions (Addendum)
Use senna for constipation Controlled DM with hgb A1c of 6.1. decrease levemir to 15units daily. Abnormal renal function due to end stage renal failure. Normal lipid panel. Abnormal thyroid function, increased levothyroixine to 158mcg. Abnormal cbc: anemia due to CKD and mild elevation in WBC. Need to repeat labs during 90month f/up appt. Only klonopin 0.5mg  x 15days sent till medication can be confirmed from previous pcp's records.  Sign medical release to get record from previous pcp and nephrology.  Send mychart reading with current sliding scale order.  Will let you know when letter for Utility company is ready. Contact social services about form for wheelchair ramp.

## 2018-07-01 NOTE — Assessment & Plan Note (Signed)
Perm cath- R. chest Dialysis schedule: Tues, Thurs, Sat. By [facility] in [place].

## 2018-07-01 NOTE — Assessment & Plan Note (Signed)
Use of midodrine prescribed by neprologist

## 2018-07-01 NOTE — Progress Notes (Signed)
New Patient Office Visit  Subjective:  Patient ID: Joan Mosley, female    DOB: 1945-02-17  Age: 73 y.o. MRN: [id number]  CC:  Chief Complaint  Patient presents with  . Establish Care    est care/medication refill, letter from [place] due to oxygen use 24/7 and ramp built to the house to send to medicare. FYI--[place],go to dialysis Tue,Thrus,Sat   Accompanied by daughter [name]- lives with her and she is primary caregiver.  HPI Joan Mosley presents for medication refill and establish care. She moved from [place] to [place] 102months ago. She lives with daughter-[name] at this time, which is also her primary caregiver. She is a mother of 5 adult children (5sons and 1daughter), has grand and great-grandchildren. Previous pcp: Oralia Manis, NP with [facility], last seen 01/2018. Previous nephrologist: Dr. Dwyane Dee. 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 Chest perm cath), DM(insulin use), Hypothyroidism, Chronic back and knee pain (s/p bilateral knee replacement), Gait Instability, Anxiety, and Constipation.  Covid test completed 03/2018: negative  CKD-end stage:  (dialysis T,TH, Sat via R. Chest perm cath)  COPD-emphysema: Not followed by pulmonology O2 via nasal cannula dependent Uses only Albuterol prn  Hx of TIA: Reports 2 incidents of TIA over 5years ago, plavix was prescribed at the time. Denies any CVA or MI/CAD.  Hx of CHF: Does not remember last Echo, was not followed by cardiology.  Anxiety: Stable Chronic use of klonopin BID and citalopram Depression screen [facility] 2/9 07/01/2018  Decreased Interest 0  Down, Depressed, Hopeless 0  PHQ - 2 Score 0   MMSE - Mini Mental State Exam 07/01/2018  Orientation to time 5  Orientation to Place 5  Registration 3  Attention/ Calculation  5  Recall 2  Language- name 2 objects 2  Language- repeat 1  Language- follow 3 step command 3  Language- read & follow direction 1  Write a sentence 1  Copy design 1  Total score 29   DM: Not followed by endocrinology AM glucose: 87-100s PM glucose: 120-140s Current use of levemir and Humalog SSI Denies any hypoglycemia.  Hypothyroidism: Current use of synthroid 160mcg.  Constipation: Waxing and waning, onset with use of iron, calcium and phosphate binders. Use of dulcolax daily with some relief. Has BM 1-2x/day with straining. Hx of hemorrhoids and diverticulosis.  Past Medical History:  Diagnosis Date  . Arthritis   . COPD (chronic obstructive pulmonary disease) ([place])   . Diabetes mellitus without complication ([place])   . Oxygen dependent 03/30/2018   PT uses O2  all the time  . Renal disorder    ESRD  . Thyroid disease     Past Surgical History:  Procedure Laterality Date  . ABDOMINAL HYSTERECTOMY    . RECTOCELE REPAIR    . REPLACEMENT TOTAL KNEE BILATERAL Bilateral 2008  . THYROIDECTOMY     80%  . VAGINAL WOUND CLOSURE / REPAIR      Family History  Problem Relation Age of Onset  . Heart disease Mother   . Hypertension Mother   . Heart disease Father   . Hypertension Father     Social History   Socioeconomic History  . Marital status: Widowed    Spouse name: Not on file  . Number of children: 5  . Years of education: Not  on file  . Highest education level: Not on file  Occupational History    Comment: disabled  Social Needs  . Financial resource strain: Not on file  . Food insecurity:    Worry: Not on file    Inability: Not on file  . Transportation needs:    Medical: Not on file    Non-medical: Not on file  Tobacco Use  . Smoking status: Former Smoker    Last attempt to quit: 03/29/1981    Years since quitting: 37.2  . Smokeless tobacco: Never Used  Substance and Sexual Activity  . Alcohol use: Never    Frequency: Never  . Drug use:  Never  . Sexual activity: Not Currently  Lifestyle  . Physical activity:    Days per week: Not on file    Minutes per session: Not on file  . Stress: Not on file  Relationships  . Social connections:    Talks on phone: Not on file    Gets together: Not on file    Attends religious service: Not on file    Active member of club or organization: Not on file    Attends meetings of clubs or organizations: Not on file    Relationship status: Not on file  . Intimate partner violence:    Fear of current or ex partner: Not on file    Emotionally abused: Not on file    Physically abused: Not on file    Forced sexual activity: Not on file  Other Topics Concern  . Not on file  Social History Narrative  . Not on file    ROS Review of Systems  Constitutional: Negative.   HENT: Negative.   Eyes: Negative.   Respiratory: Positive for cough, shortness of breath and wheezing. Negative for stridor.        Chronic per patient  Gastrointestinal: Positive for constipation. Negative for blood in stool, diarrhea, nausea, rectal pain and vomiting.  Endocrine: Negative.   Genitourinary: Negative.   Musculoskeletal: Positive for back pain and gait problem.  Skin: Negative.   Hematological: Negative.   Psychiatric/Behavioral: Positive for agitation and confusion. Negative for decreased concentration, dysphoric mood, hallucinations, sleep disturbance and suicidal ideas. The patient is nervous/anxious. The patient is not hyperactive.    Objective:   Today's Vitals: BP 134/90   Pulse 88   Temp 98.1 F (36.7 C) (Oral)   Ht 5' 1.25" (1.556 m)   Wt 216 lb 12.8 oz (98.3 kg) Comment: wheelchare 57.2 lb  SpO2 97%   BMI 40.63 kg/m   Physical Exam Constitutional:      General: She is not in acute distress.    Appearance: She is obese.  Eyes:     Extraocular Movements: Extraocular movements intact.     Conjunctiva/sclera: Conjunctivae normal.  Neck:     Musculoskeletal: Normal range of motion  and neck supple.  Cardiovascular:     Rate and Rhythm: Normal rate and regular rhythm.     Pulses: Normal pulses.     Heart sounds: Normal heart sounds.  Pulmonary:     Effort: Pulmonary effort is normal.     Breath sounds: Normal breath sounds.  Abdominal:     General: Bowel sounds are normal.     Palpations: Abdomen is soft.     Tenderness: There is no abdominal tenderness.  Musculoskeletal:     Right lower leg: Edema present.     Left lower leg: Edema present.     Comments: Wheelchair bound  Skin:    Findings: No erythema or rash.  Neurological:     Mental Status: She is oriented to person, place, and time.  Psychiatric:        Mood and Affect: Mood normal.        Behavior: Behavior normal.    Assessment & Plan:   Problem List Items Addressed This Visit      Cardiovascular and Mediastinum   Hemodialysis-associated hypotension    Use of midodrine prescribed by neprologist      Relevant Medications   aspirin (BAYER LOW DOSE) 81 MG EC tablet   rosuvastatin (CRESTOR) 40 MG tablet   furosemide (LASIX) 80 MG tablet   midodrine (PROAMATINE) 10 MG tablet   Other Relevant Orders   Comprehensive metabolic panel (Completed)   CBC w/Diff (Completed)   Microalbumin / creatinine urine ratio     Digestive   Drug-induced constipation   Relevant Medications   senna-docusate (SENNA PLUS) 8.6-50 MG tablet     Endocrine   Hypothyroidism   Relevant Medications   levothyroxine (SYNTHROID) 112 MCG tablet   Other Relevant Orders   T4, free   TSH (Completed)   T3, free (Completed)   Microalbumin / creatinine urine ratio   Type 2 diabetes mellitus with diabetic polyneuropathy, with long-term current use of insulin (HCC) - Primary   Relevant Medications   aspirin (BAYER LOW DOSE) 81 MG EC tablet   rosuvastatin (CRESTOR) 40 MG tablet   insulin lispro (HUMALOG) 100 UNIT/ML injection   clonazePAM (KLONOPIN) 0.5 MG tablet   citalopram (CELEXA) 20 MG tablet   insulin detemir  (LEVEMIR) 100 UNIT/ML injection   Other Relevant Orders   Hemoglobin A1c (Completed)   Comprehensive metabolic panel (Completed)   Microalbumin / creatinine urine ratio     Genitourinary   Stage 5 chronic kidney disease on chronic dialysis [facility])    Perm cath- R. chest Dialysis schedule: Tues, Thurs, Sat. By [facility] in [place].      Relevant Orders   Comprehensive metabolic panel (Completed)   CBC w/Diff (Completed)   Microalbumin / creatinine urine ratio     Other   Anxiety   Relevant Medications   clonazePAM (KLONOPIN) 0.5 MG tablet   citalopram (CELEXA) 20 MG tablet   Counseling regarding advanced directives and goals of care    Completed and signed ACP electronically      Dialysis patient [facility])    Perm cath- R. chest Dialysis schedule: Tues, Thurs, Sat. By [facility] in [place].      Relevant Orders   Microalbumin / creatinine urine ratio   Full code status   Gait instability    Due to back and knee pain. No improvement with home PT and OT in past Wheelchair dependent. Unable to use walker. Stands with 2-persons assist.       Other Visit Diagnoses    Encounter for lipid screening for cardiovascular disease       Relevant Orders   Lipid panel (Completed)   Encounter for hepatitis C screening test for low risk patient       Relevant Orders   Hepatitis C Antibody   Prsnl hx of TIA (TIA), and cereb infrc w/o resid deficits       Relevant Medications   clopidogrel (PLAVIX) 75 MG tablet      Outpatient Encounter Medications as of 07/01/2018  Medication Sig  . aspirin (BAYER LOW DOSE) 81 MG EC tablet Take 81 mg by mouth daily. Swallow whole.  Marland Kitchen  calcitRIOL (ROCALTROL) 0.25 MCG capsule Take 0.25 mcg by mouth daily.  . citalopram (CELEXA) 20 MG tablet Take 1 tablet (20 mg total) by mouth daily. AM  . clonazePAM (KLONOPIN) 0.5 MG tablet Take 1 tablet (0.5 mg total) by mouth 2 (two) times daily. 1 AM and 1 Pm  . clopidogrel (PLAVIX) 75 MG  tablet Take 1 tablet (75 mg total) by mouth daily.  . furosemide (LASIX) 80 MG tablet Take 80 mg by mouth. Sunday, Monday,Wed,Friday--in AM  . insulin detemir (LEVEMIR) 100 UNIT/ML injection Inject 0.15 mLs (15 Units total) into the skin daily.  . insulin lispro (HUMALOG) 100 UNIT/ML injection Inject into the skin 3 (three) times daily before meals. Sliding scale  . IPRATROPIUM BROMIDE IN Inhale into the lungs. 0.5mg -3mg  PRN  . levothyroxine (SYNTHROID) 112 MCG tablet Take 1 tablet (112 mcg total) by mouth daily before breakfast. 1 AM  . lidocaine (LIDODERM) 5 % Place 1 patch onto the skin daily. Remove & Discard patch within 12 hours or as directed by MD  . midodrine (PROAMATINE) 10 MG tablet Take 10 mg by mouth 3 (three) times daily. Tue,Thru,Sat  . montelukast (SINGULAIR) 10 MG tablet Take 10 mg by mouth at bedtime. PM  . nystatin (MYCOSTATIN/NYSTOP) powder Apply topically 4 (four) times daily.  Marland Kitchen nystatin cream (MYCOSTATIN) Apply 1 application topically 2 (two) times daily.  Marland Kitchen PRESCRIPTION MEDICATION Sevelamer 3 times daily  . rosuvastatin (CRESTOR) 40 MG tablet Take 40 mg by mouth daily. AM  . SEVELAMER HCL PO Take by mouth. 1600 mg --3 times daily--take additional 1-2 if eats snack  . [DISCONTINUED] citalopram (CELEXA) 20 MG tablet Take 20 mg by mouth daily. AM  . [DISCONTINUED] clonazePAM (KLONOPIN) 1 MG tablet Take 1 mg by mouth 2 (two) times daily. 1 AM and 1 Pm  . [DISCONTINUED] clopidogrel (PLAVIX) 75 MG tablet Take 75 mg by mouth daily.  . [DISCONTINUED] insulin detemir (LEVEMIR) 100 UNIT/ML injection Inject into the skin daily. 30 units in AM  . [DISCONTINUED] levothyroxine (SYNTHROID) 100 MCG tablet Take 100 mcg by mouth daily before breakfast. 1 AM  . senna-docusate (SENNA PLUS) 8.6-50 MG tablet Take 1 tablet by mouth at bedtime.  . [DISCONTINUED] predniSONE (DELTASONE) 50 MG tablet Take 1 tablet (50 mg total) by mouth daily. (Patient not taking: Reported on 07/01/2018)   No  facility-administered encounter medications on file as of 07/01/2018.     Follow-up: Return in about 3 months (around 10/01/2018) for DM and HTN, hyperlipidemia (53mins).   Wilfred Lacy, NP

## 2018-07-01 NOTE — Assessment & Plan Note (Addendum)
Due to back and knee pain. No improvement with home PT and OT in past Wheelchair dependent. Unable to use walker. Stands with 2-persons assist.

## 2018-07-01 NOTE — Assessment & Plan Note (Addendum)
Perm cath- R. chest Dialysis schedule: Tues, Thurs, Sat. By [facility] in [place].

## 2018-07-01 NOTE — Assessment & Plan Note (Signed)
Completed and signed ACP electronically

## 2018-07-04 DIAGNOSIS — F419 Anxiety disorder, unspecified: Secondary | ICD-10-CM | POA: Insufficient documentation

## 2018-07-04 LAB — LIPID PANEL
Cholesterol: 164 mg/dL (ref <200)
HDL: 78 mg/dL (ref >=50)
LDL Cholesterol (Calc): 59 mg/dL (calc)
Non-HDL Cholesterol (Calc): 86 mg/dL (calc) (ref <130)
Total CHOL/HDL Ratio: 2.1 (calc) (ref <5.0)
Triglycerides: 202 mg/dL — ABNORMAL HIGH (ref <150)

## 2018-07-04 LAB — CBC WITH DIFFERENTIAL/PLATELET
Absolute Monocytes: 1115 cells/uL — ABNORMAL HIGH (ref 200–950)
Basophils Absolute: 68 cells/uL (ref 0–200)
Basophils Relative: 0.5 %
Eosinophils Absolute: 313 cells/uL (ref 15–500)
Eosinophils Relative: 2.3 %
HCT: 34.7 % — ABNORMAL LOW (ref 35.0–45.0)
Hemoglobin: 11.5 g/dL — ABNORMAL LOW (ref 11.7–15.5)
Lymphs Abs: 2190 cells/uL (ref 850–3900)
MCH: 31 pg (ref 27.0–33.0)
MCHC: 33.1 g/dL (ref 32.0–36.0)
MCV: 93.5 fL (ref 80.0–100.0)
MPV: 10.8 fL (ref 7.5–12.5)
Monocytes Relative: 8.2 %
Neutro Abs: 9914 cells/uL — ABNORMAL HIGH (ref 1500–7800)
Neutrophils Relative %: 72.9 %
Platelets: 197 10*3/uL (ref 140–400)
RBC: 3.71 10*6/uL — ABNORMAL LOW (ref 3.80–5.10)
RDW: 15.6 % — ABNORMAL HIGH (ref 11.0–15.0)
Total Lymphocyte: 16.1 %
WBC: 13.6 10*3/uL — ABNORMAL HIGH (ref 3.8–10.8)

## 2018-07-04 LAB — HEPATITIS C ANTIBODY
Hepatitis C Ab: NONREACTIVE
SIGNAL TO CUT-OFF: 0.05 (ref <1.00)

## 2018-07-04 LAB — HEMOGLOBIN A1C
Hgb A1c MFr Bld: 6.1 % of total Hgb — ABNORMAL HIGH (ref <5.7)
Mean Plasma Glucose: 128 (calc)
eAG (mmol/L): 7.1 (calc)

## 2018-07-04 LAB — COMPREHENSIVE METABOLIC PANEL
AG Ratio: 1.8 (calc) (ref 1.0–2.5)
ALT: 7 U/L (ref 6–29)
AST: 10 U/L (ref 10–35)
Albumin: 4.2 g/dL (ref 3.6–5.1)
Alkaline phosphatase (APISO): 136 U/L (ref 37–153)
BUN/Creatinine Ratio: 5 (calc) — ABNORMAL LOW (ref 6–22)
BUN: 37 mg/dL — ABNORMAL HIGH (ref 7–25)
CO2: 27 mmol/L (ref 20–32)
Calcium: 9.1 mg/dL (ref 8.6–10.4)
Chloride: 92 mmol/L — ABNORMAL LOW (ref 98–110)
Creat: 7.2 mg/dL — ABNORMAL HIGH (ref 0.60–0.93)
Globulin: 2.4 g/dL (calc) (ref 1.9–3.7)
Glucose, Bld: 89 mg/dL (ref 65–99)
Potassium: 5.7 mmol/L — ABNORMAL HIGH (ref 3.5–5.3)
Sodium: 134 mmol/L — ABNORMAL LOW (ref 135–146)
Total Bilirubin: 0.5 mg/dL (ref 0.2–1.2)
Total Protein: 6.6 g/dL (ref 6.1–8.1)

## 2018-07-04 LAB — MICROALBUMIN / CREATININE URINE RATIO

## 2018-07-04 LAB — TSH: TSH: 18.31 mIU/L — ABNORMAL HIGH (ref 0.40–4.50)

## 2018-07-04 LAB — T3, FREE: T3, Free: 1.8 pg/mL — ABNORMAL LOW (ref 2.3–4.2)

## 2018-07-04 MED ORDER — CLOPIDOGREL BISULFATE 75 MG PO TABS: 75.0000 mg | ORAL_TABLET | Freq: Every day | ORAL | 1 refills | Status: DC

## 2018-07-04 MED ORDER — SENNOSIDES-DOCUSATE SODIUM 8.6-50 MG PO TABS: 1.0000 | ORAL_TABLET | Freq: Every day | ORAL | 1 refills | Status: DC

## 2018-07-04 MED ORDER — LEVOTHYROXINE SODIUM 112 MCG PO TABS: 112.0000 ug | ORAL_TABLET | Freq: Every day | ORAL | 0 refills | Status: DC

## 2018-07-04 MED ORDER — CITALOPRAM HYDROBROMIDE 20 MG PO TABS: 20.0000 mg | ORAL_TABLET | Freq: Every day | ORAL | 1 refills | Status: DC

## 2018-07-04 MED ORDER — CLONAZEPAM 0.5 MG PO TABS: 0.5000 mg | ORAL_TABLET | Freq: Two times a day (BID) | ORAL | 0 refills | Status: DC

## 2018-07-04 MED ORDER — INSULIN DETEMIR 100 UNIT/ML ~~LOC~~ SOLN: 15.0000 [IU] | Freq: Every day | SUBCUTANEOUS | 2 refills | Status: DC

## 2018-07-07 ENCOUNTER — Encounter: Payer: Self-pay | Admitting: Nurse Practitioner

## 2018-07-13 ENCOUNTER — Telehealth: Payer: Self-pay | Admitting: Nurse Practitioner

## 2018-07-13 NOTE — Telephone Encounter (Signed)
Pt is calling and has not pick up clonazepam 0.5 mg. Pt would like clonazepam 1 mg to take twice a day. [facility] [address]

## 2018-07-13 NOTE — Telephone Encounter (Signed)
Spoke with the pt, go over lab result and explain why we sent in 0.5 mg for right now. Pt is aware and bring in records release to the office tomorrow before 4 pm. FYI--pt stated she will pick up 0.5 mg and wait for record to come in--so Baldo Ash can change her dosage.

## 2018-07-13 NOTE — Telephone Encounter (Signed)
Spoke with the pt, she stated she always take 1 mg tab twice daily--and 0.5 mg didn't help with anxiety in the past (pt mention this during last ov). [name] please advise.

## 2018-07-25 ENCOUNTER — Telehealth: Payer: Self-pay | Admitting: Nurse Practitioner

## 2018-07-25 NOTE — Telephone Encounter (Signed)
Patient calling and states that she is needing her clonazePAM (KLONOPIN) 0.5 MG tablet To be changed to a 1MG  tablet. States that she has always been on a 1MG  tablet since moving from [place]. Would like this medication to be changed and a new prescription called into the pharmacy, if possible. Please advise.  [id number] [address], [place] AT [address]

## 2018-07-26 NOTE — Telephone Encounter (Signed)
LVM for the pt to call back, need to know if they drop off the release form yet?   [name] you dont recall get the record for the pt right?

## 2018-07-26 NOTE — Telephone Encounter (Signed)
No I do not have records

## 2018-07-27 NOTE — Telephone Encounter (Signed)
Advise the pt that Joan Mosley needs to see record from previous doctor first before she can change the dosage. Pt stated she will drop it off this week.

## 2018-08-01 ENCOUNTER — Encounter (HOSPITAL_COMMUNITY): Payer: Self-pay | Admitting: Emergency Medicine

## 2018-08-01 ENCOUNTER — Emergency Department (HOSPITAL_COMMUNITY)
Admission: EM | Admit: 2018-08-01 | Discharge: 2018-08-01 | Disposition: A | Discharge status: Home / Self Care | Payer: Medicare Other | Attending: Emergency Medicine | Admitting: Emergency Medicine

## 2018-08-01 ENCOUNTER — Other Ambulatory Visit: Payer: Self-pay

## 2018-08-01 ENCOUNTER — Emergency Department (HOSPITAL_COMMUNITY): Payer: Medicare Other

## 2018-08-01 DIAGNOSIS — E875 Hyperkalemia: Secondary | ICD-10-CM

## 2018-08-01 DIAGNOSIS — E1122 Type 2 diabetes mellitus with diabetic chronic kidney disease: Secondary | ICD-10-CM | POA: Insufficient documentation

## 2018-08-01 DIAGNOSIS — J449 Chronic obstructive pulmonary disease, unspecified: Secondary | ICD-10-CM | POA: Diagnosis not present

## 2018-08-01 DIAGNOSIS — R0602 Shortness of breath: Secondary | ICD-10-CM | POA: Diagnosis not present

## 2018-08-01 DIAGNOSIS — Z87891 Personal history of nicotine dependence: Secondary | ICD-10-CM | POA: Insufficient documentation

## 2018-08-01 DIAGNOSIS — R9431 Abnormal electrocardiogram [ECG] [EKG]: Secondary | ICD-10-CM | POA: Diagnosis not present

## 2018-08-01 DIAGNOSIS — Z794 Long term (current) use of insulin: Secondary | ICD-10-CM | POA: Diagnosis not present

## 2018-08-01 DIAGNOSIS — Z992 Dependence on renal dialysis: Secondary | ICD-10-CM | POA: Insufficient documentation

## 2018-08-01 DIAGNOSIS — Z20828 Contact with and (suspected) exposure to other viral communicable diseases: Secondary | ICD-10-CM | POA: Diagnosis not present

## 2018-08-01 DIAGNOSIS — Z79899 Other long term (current) drug therapy: Secondary | ICD-10-CM | POA: Insufficient documentation

## 2018-08-01 DIAGNOSIS — Z7982 Long term (current) use of aspirin: Secondary | ICD-10-CM | POA: Insufficient documentation

## 2018-08-01 DIAGNOSIS — E039 Hypothyroidism, unspecified: Secondary | ICD-10-CM | POA: Insufficient documentation

## 2018-08-01 DIAGNOSIS — N186 End stage renal disease: Secondary | ICD-10-CM | POA: Insufficient documentation

## 2018-08-01 DIAGNOSIS — Z7902 Long term (current) use of antithrombotics/antiplatelets: Secondary | ICD-10-CM | POA: Insufficient documentation

## 2018-08-01 LAB — RENAL FUNCTION PANEL
Albumin: 3.4 g/dL — ABNORMAL LOW (ref 3.5–5.0)
Anion gap: 19 — ABNORMAL HIGH (ref 5–15)
BUN: 76 mg/dL — ABNORMAL HIGH (ref 8–23)
CO2: 21 mmol/L — ABNORMAL LOW (ref 22–32)
Calcium: 8.5 mg/dL — ABNORMAL LOW (ref 8.9–10.3)
Chloride: 94 mmol/L — ABNORMAL LOW (ref 98–111)
Creatinine, Ser: 10.46 mg/dL — ABNORMAL HIGH (ref 0.44–1.00)
GFR calc Af Amer: 4 mL/min — ABNORMAL LOW (ref >60)
GFR calc non Af Amer: 3 mL/min — ABNORMAL LOW (ref >60)
Glucose, Bld: 125 mg/dL — ABNORMAL HIGH (ref 70–99)
Phosphorus: 11.4 mg/dL — ABNORMAL HIGH (ref 2.5–4.6)
Potassium: 6.4 mmol/L (ref 3.5–5.1)
Sodium: 134 mmol/L — ABNORMAL LOW (ref 135–145)

## 2018-08-01 LAB — CBC WITH DIFFERENTIAL/PLATELET
Abs Immature Granulocytes: 0.06 10*3/uL (ref 0.00–0.07)
Basophils Absolute: 0 10*3/uL (ref 0.0–0.1)
Basophils Relative: 0 %
Eosinophils Absolute: 0.2 10*3/uL (ref 0.0–0.5)
Eosinophils Relative: 2 %
HCT: 35.6 % — ABNORMAL LOW (ref 36.0–46.0)
Hemoglobin: 11.2 g/dL — ABNORMAL LOW (ref 12.0–15.0)
Immature Granulocytes: 1 %
Lymphocytes Relative: 14 %
Lymphs Abs: 1.4 10*3/uL (ref 0.7–4.0)
MCH: 30.1 pg (ref 26.0–34.0)
MCHC: 31.5 g/dL (ref 30.0–36.0)
MCV: 95.7 fL (ref 80.0–100.0)
Monocytes Absolute: 0.5 10*3/uL (ref 0.1–1.0)
Monocytes Relative: 6 %
Neutro Abs: 7.7 10*3/uL (ref 1.7–7.7)
Neutrophils Relative %: 77 %
Platelets: 163 10*3/uL (ref 150–400)
RBC: 3.72 MIL/uL — ABNORMAL LOW (ref 3.87–5.11)
RDW: 16.9 % — ABNORMAL HIGH (ref 11.5–15.5)
WBC: 9.9 10*3/uL (ref 4.0–10.5)
nRBC: 0 % (ref 0.0–0.2)

## 2018-08-01 LAB — POCT I-STAT 7, (LYTES, BLD GAS, ICA,H+H)
Acid-base deficit: 2 mmol/L (ref 0.0–2.0)
Bicarbonate: 22.4 mmol/L (ref 20.0–28.0)
Calcium, Ion: 1.08 mmol/L — ABNORMAL LOW (ref 1.15–1.40)
HCT: 31 % — ABNORMAL LOW (ref 36.0–46.0)
Hemoglobin: 10.5 g/dL — ABNORMAL LOW (ref 12.0–15.0)
O2 Saturation: 86 %
Patient temperature: 98.4
Potassium: 6.2 mmol/L — ABNORMAL HIGH (ref 3.5–5.1)
Sodium: 128 mmol/L — ABNORMAL LOW (ref 135–145)
TCO2: 24 mmol/L (ref 22–32)
pCO2 arterial: 37 mmHg (ref 32.0–48.0)
pH, Arterial: 7.39 (ref 7.350–7.450)
pO2, Arterial: 51 mmHg — ABNORMAL LOW (ref 83.0–108.0)

## 2018-08-01 LAB — SARS CORONAVIRUS 2 BY RT PCR (HOSPITAL ORDER, PERFORMED IN ~~LOC~~ HOSPITAL LAB): SARS Coronavirus 2: NEGATIVE

## 2018-08-01 LAB — CBG MONITORING, ED: Glucose-Capillary: 158 mg/dL — ABNORMAL HIGH (ref 70–99)

## 2018-08-01 MED ORDER — SODIUM ZIRCONIUM CYCLOSILICATE 10 G PO PACK
10.0000 g | PACK | Freq: Once | ORAL | Status: DC
  Filled 2018-08-01: qty 1

## 2018-08-01 MED ORDER — ALBUTEROL SULFATE (2.5 MG/3ML) 0.083% IN NEBU
10.0000 mg | INHALATION_SOLUTION | Freq: Once | RESPIRATORY_TRACT | Status: AC
  Administered 2018-08-01: 10 mg via RESPIRATORY_TRACT
  Filled 2018-08-01: qty 12

## 2018-08-01 MED ORDER — HEPARIN SODIUM (PORCINE) 1000 UNIT/ML IJ SOLN
INTRAMUSCULAR | Status: AC
  Filled 2018-08-01: qty 4

## 2018-08-01 MED ORDER — CHLORHEXIDINE GLUCONATE CLOTH 2 % EX PADS: 6.0000 | MEDICATED_PAD | Freq: Every day | CUTANEOUS | Status: DC

## 2018-08-01 MED ORDER — DEXTROSE 10 % IV SOLN
Freq: Once | INTRAVENOUS | Status: AC
  Administered 2018-08-01: 07:00:00 via INTRAVENOUS

## 2018-08-01 MED ORDER — CLONAZEPAM 0.5 MG PO TABS
0.5000 mg | ORAL_TABLET | Freq: Once | ORAL | Status: AC
  Administered 2018-08-01: 0.5 mg via ORAL
  Filled 2018-08-01: qty 1

## 2018-08-01 MED ORDER — ALBUTEROL (5 MG/ML) CONTINUOUS INHALATION SOLN
10.0000 mg/h | INHALATION_SOLUTION | RESPIRATORY_TRACT | Status: DC
  Administered 2018-08-01: 10 mg/h via RESPIRATORY_TRACT
  Filled 2018-08-01: qty 20

## 2018-08-01 MED ORDER — FUROSEMIDE 10 MG/ML IJ SOLN
40.0000 mg | Freq: Once | INTRAMUSCULAR | Status: AC
  Administered 2018-08-01: 40 mg via INTRAVENOUS
  Filled 2018-08-01: qty 4

## 2018-08-01 NOTE — Progress Notes (Signed)
Renal Navigator notified OP HD clinic[facility] of patient's negative COVID 19 test result to provide continuity of care and safety.  Alphonzo Cruise, [place] Renal Navigator [phone]

## 2018-08-01 NOTE — Discharge Instructions (Addendum)
As discussed, with our nephrology colleagues, it is very important you proceed to your dialysis center now. Return here for concerning changes in your condition.

## 2018-08-01 NOTE — ED Provider Notes (Addendum)
Patient has been seen and evaluated by our nephrology colleagues. As she has missed dialysis recently, has elevated potassium, need is for dialysis, but the patient has a strong preference to go to her own outpatient center, and arrangements have been made, with family and staff to facilitate this. Nephrology recommends discharge with this plan.   8:57 AM Correction, there is nowavailable at her dialysis center and she will stay here for dialysis.   Carmin Muskrat, MD 08/01/18 [phone]

## 2018-08-01 NOTE — ED Notes (Signed)
Ordered diet tray for pt  

## 2018-08-01 NOTE — ED Triage Notes (Signed)
Pt presents to ED from home by [facility]. Pt began feeling SOB today after missing dialysis sat. Pt reports fever last week and cough x1w. Pt wears 5L [place] at home.

## 2018-08-01 NOTE — Progress Notes (Signed)
Pt. Discharged home with family via private vehicle after HD tx pt. Stable pt. States "I feel better"  bp 107/69 HR 94 o2 Sat 98% 5L [place]. Pt. Denies any discomfort or distress. IV access removed x1
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
## 2018-08-01 NOTE — ED Provider Notes (Signed)
[facility] EMERGENCY DEPARTMENT Provider Note  CSN: [id number] Arrival date & time: 08/01/18 0430  Chief Complaint(s) Shortness of Breath  HPI Joan Mosley is a 73 y.o. female with a past medical history listed below including COPD on 5 L nasal cannula at home, ESRD on dialysis TTS who presents to the emergency department with several hours of gradually worsening shortness of breath and feeling like she is volume overloaded.  Patient missed dialysis on Saturday due to car trouble.  She endorses chronic productive cough without acute changes.  No recent fevers or infections.  Mild nausea without emesis.  No abdominal pain.  No diarrhea.  No chest pain.  HPI  Past Medical History Past Medical History:  Diagnosis Date   Arthritis    COPD (chronic obstructive pulmonary disease) ([place])    Diabetes mellitus without complication ([place])    Oxygen dependent 03/30/2018   PT uses O2  all the time   Renal disorder    ESRD   Thyroid disease    Patient Active Problem List   Diagnosis Date Noted   Anxiety 07/04/2018   Counseling regarding advanced directives and goals of care 07/01/2018   Full code status 07/01/2018   Drug-induced constipation 07/01/2018   Hemodialysis-associated hypotension 07/01/2018   Dialysis patient ([place]) 07/01/2018   HOH (hard of hearing) 07/01/2018   Stage 5 chronic kidney disease on chronic dialysis ([place]) 07/01/2018   Type 2 diabetes mellitus with diabetic polyneuropathy, with long-term current use of insulin ([place]) 07/01/2018   Hypothyroidism 07/01/2018   Gait instability 07/01/2018   Hyperkalemia 04/20/2018   Home Medication(s) Prior to Admission medications   Medication Sig Start Date End Date Taking? Authorizing Provider  aspirin (BAYER LOW DOSE) 81 MG EC tablet Take 81 mg by mouth daily. Swallow whole.    [provider]  calcitRIOL (ROCALTROL) 0.25 MCG capsule Take 0.25 mcg by mouth daily.    [provider]  citalopram (CELEXA) 20 MG tablet Take 1 tablet (20 mg total) by mouth daily. AM 07/04/18   Nche, Charlene Brooke, NP  clonazePAM (KLONOPIN) 0.5 MG tablet Take 1 tablet (0.5 mg total) by mouth 2 (two) times daily. 1 AM and 1 Pm 07/04/18   Nche, Charlene Brooke, NP  clopidogrel (PLAVIX) 75 MG tablet Take 1 tablet (75 mg total) by mouth daily. 07/04/18   Nche, Charlene Brooke, NP  furosemide (LASIX) 80 MG tablet Take 80 mg by mouth. Sunday, Monday,Wed,Friday--in AM    [provider]  insulin detemir (LEVEMIR) 100 UNIT/ML injection Inject 0.15 mLs (15 Units total) into the skin daily. 07/04/18   Nche, Charlene Brooke, NP  insulin lispro (HUMALOG) 100 UNIT/ML injection Inject into the skin 3 (three) times daily before meals. Sliding scale    [provider]  IPRATROPIUM BROMIDE IN Inhale into the lungs. 0.5mg -3mg  PRN    [provider]  levothyroxine (SYNTHROID) 112 MCG tablet Take 1 tablet (112 mcg total) by mouth daily before breakfast. 1 AM 07/04/18   Nche, Charlene Brooke, NP  lidocaine (LIDODERM) 5 % Place 1 patch onto the skin daily. Remove & Discard patch within 12 hours or as directed by MD    [provider]  midodrine (PROAMATINE) 10 MG tablet Take 10 mg by mouth 3 (three) times daily. Tue,Thru,Sat    [provider]  montelukast (SINGULAIR) 10 MG tablet Take 10 mg by mouth at bedtime. PM    [provider]  nystatin (MYCOSTATIN/NYSTOP) powder Apply topically 4 (four) times  daily.    [provider]  nystatin cream (MYCOSTATIN) Apply 1 application topically 2 (two) times daily.    [provider]  PRESCRIPTION MEDICATION Sevelamer 3 times daily    [provider]  rosuvastatin (CRESTOR) 40 MG tablet Take 40 mg by mouth daily. AM    [provider]  senna-docusate (SENNA PLUS) 8.6-50 MG tablet Take 1 tablet by mouth at bedtime. 07/04/18   Nche, Charlene Brooke, NP  SEVELAMER HCL PO Take by mouth. 1600 mg --3 times daily--take  additional 1-2 if eats snack    [provider]                                                                                                                                    Past Surgical History Past Surgical History:  Procedure Laterality Date   ABDOMINAL HYSTERECTOMY     RECTOCELE REPAIR     REPLACEMENT TOTAL KNEE BILATERAL Bilateral 2008   THYROIDECTOMY     80%   VAGINAL WOUND CLOSURE / REPAIR     Family History Family History  Problem Relation Age of Onset   Heart disease Mother    Hypertension Mother    Heart disease Father    Hypertension Father     Social History Social History   Tobacco Use   Smoking status: Former Smoker    Quit date: 03/29/1981    Years since quitting: 37.3   Smokeless tobacco: Never Used  Substance Use Topics   Alcohol use: Never    Frequency: Never   Drug use: Never   Allergies Avelox [moxifloxacin hcl in nacl], Codeine, Other, Penicillins, Sulfa antibiotics, and Shellfish allergy  Review of Systems Review of Systems All other systems are reviewed and are negative for acute change except as noted in the HPI  Physical Exam Vital Signs  I have reviewed the triage vital signs BP (!) 140/128    Pulse 81    Temp 98.4 F (36.9 C) (Oral)    Resp 19    Ht 5\' 1"  (1.549 m)    Wt 98 kg    SpO2 94%    BMI 40.81 kg/m   Physical Exam Vitals signs reviewed.  Constitutional:      General: She is not in acute distress.    Appearance: She is well-developed. She is obese. She is not diaphoretic.  HENT:     Head: Normocephalic and atraumatic.     Nose: Nose normal.  Eyes:     General: No scleral icterus.       Right eye: No discharge.        Left eye: No discharge.     Conjunctiva/sclera: Conjunctivae normal.     Pupils: Pupils are equal, round, and reactive to light.  Neck:     Musculoskeletal: Normal range of motion and neck supple.  Cardiovascular:     Rate and Rhythm: Normal rate and regular rhythm.  Heart  sounds: No murmur. No friction rub. No gallop.      Comments: Chronic bilateral lower extremity nonpitting edema. Pulmonary:     Effort: Pulmonary effort is normal. No respiratory distress.     Breath sounds: Normal breath sounds. No stridor. No rales.  Chest:    Abdominal:     General: There is no distension.     Palpations: Abdomen is soft.     Tenderness: There is no abdominal tenderness.  Musculoskeletal:        General: No tenderness.  Skin:    General: Skin is warm and dry.     Findings: No erythema or rash.  Neurological:     Mental Status: She is alert and oriented to person, place, and time.     ED Results and Treatments Labs (all labs ordered are listed, but only abnormal results are displayed) Labs Reviewed  RENAL FUNCTION PANEL - Abnormal; Notable for the following components:      Result Value   Sodium 134 (*)    Potassium 6.4 (*)    Chloride 94 (*)    CO2 21 (*)    Glucose, Bld 125 (*)    BUN 76 (*)    Creatinine, Ser 10.46 (*)    Calcium 8.5 (*)    Phosphorus 11.4 (*)    Albumin 3.4 (*)    GFR calc non Af Amer 3 (*)    GFR calc Af Amer 4 (*)    Anion gap 19 (*)    All other components within normal limits  CBC WITH DIFFERENTIAL/PLATELET - Abnormal; Notable for the following components:   RBC 3.72 (*)    Hemoglobin 11.2 (*)    HCT 35.6 (*)    RDW 16.9 (*)    All other components within normal limits  POCT I-STAT 7, (LYTES, BLD GAS, ICA,H+H) - Abnormal; Notable for the following components:   pO2, Arterial 51.0 (*)    Sodium 128 (*)    Potassium 6.2 (*)    Calcium, Ion 1.08 (*)    HCT 31.0 (*)    Hemoglobin 10.5 (*)    All other components within normal limits                                                                                                                         EKG  EKG Interpretation  Date/Time:  Monday August 01 2018 04:36:18 EDT Ventricular Rate:  91 PR Interval:    QRS Duration: 103 QT Interval:  384 QTC  Calculation: 473 R Axis:   4 Text Interpretation:  Sinus rhythm motion artifact peaked TW Otherwise no significant change Reconfirmed by Addison Lank [phone]) on 08/01/2018 6:27:24 AM      Radiology Dg Chest 2 View  Result Date: 08/01/2018 CLINICAL DATA:  Shortness of breath. EXAM: CHEST - 2 VIEW COMPARISON:  05/18/2018 and 04/19/2018 FINDINGS: Heart size is normal. Pulmonary vascular congestion at the bases posteriorly best seen on the lateral view. Central  venous dialysis catheter in place, unchanged. No acute bone abnormality. IMPRESSION: Pulmonary vascular congestion at the lung bases. Electronically Signed   By: [name] M.D.   On: 08/01/2018 05:35    Pertinent labs & imaging results that were available during my care of the patient were reviewed by me and considered in my medical decision making (see chart for details).  Medications Ordered in ED Medications  albuterol (PROVENTIL) (2.5 MG/3ML) 0.083% nebulizer solution 10 mg (has no administration in time range)  dextrose 10 % infusion (has no administration in time range)  sodium zirconium cyclosilicate (LOKELMA) packet 10 g (has no administration in time range)  furosemide (LASIX) injection 40 mg (40 mg Intravenous Given 08/01/18 0618)  clonazePAM (KLONOPIN) tablet 0.5 mg (0.5 mg Oral Given 08/01/18 0618)                                                                                                                                    Procedures .Critical Care Performed by: Fatima Blank, MD Authorized by: Fatima Blank, MD    CRITICAL CARE Performed by: Grayce Sessions Kritika Stukes Total critical care time: 40 minutes Critical care time was exclusive of separately billable procedures and treating other patients. Critical care was necessary to treat or prevent imminent or life-threatening deterioration. Critical care was time spent personally by me on the following activities: development of treatment plan with  patient and/or surrogate as well as nursing, discussions with consultants, evaluation of patient's response to treatment, examination of patient, obtaining history from patient or surrogate, ordering and performing treatments and interventions, ordering and review of laboratory studies, ordering and review of radiographic studies, pulse oximetry and re-evaluation of patient's condition.    (including critical care time)  Medical Decision Making / ED Course I have reviewed the nursing notes for this encounter and the patient's prior records (if available in EHR or on provided paperwork).   Joan Mosley was evaluated in Emergency Department on 08/01/2018 for the symptoms described in the history of present illness. She was evaluated in the context of the global COVID-19 pandemic, which necessitated consideration that the patient might be at risk for infection with the SARS-CoV-2 virus that causes COVID-19. Institutional protocols and algorithms that pertain to the evaluation of patients at risk for COVID-19 are in a state of rapid change based on information released by regulatory bodies including the CDC and federal and state organizations. These policies and algorithms were followed during the patient's care in the ED.   Missed dialysis;  presents with shortness of breath.  Chest x-ray without evidence of pulmonary edema. No PNA. Doubt PE.  She is satting well on her home 5 L.  Screening labs did reveal hyperkalemia and EKG with peaked T waves. Temporized with lasix, albuterol, insulin, and lokelma. Consulted Nephrology for HD. Will likely be DC following HD.      Final Clinical Impression(s) / ED Diagnoses Final  diagnoses:  SOB (shortness of breath)  Hyperkalemia      This chart was dictated using voice recognition software.  Despite best efforts to proofread,  errors can occur which can change the documentation meaning.   Fatima Blank, MD 08/01/18 [phone]

## 2018-08-01 NOTE — ED Notes (Signed)
Patient requesting to be repositioned from the stretcher to recliner. Patient moved by 2 staff members. Patient became extremely short of breath and was in tripod position. Respiratory called and MD called.

## 2018-08-01 NOTE — ED Notes (Signed)
Gave patient a cup of decaffeinate coffee patient is resting with call bell in reach

## 2018-08-02 ENCOUNTER — Other Ambulatory Visit: Payer: Self-pay

## 2018-08-02 ENCOUNTER — Ambulatory Visit: Payer: Self-pay | Admitting: Nurse Practitioner

## 2018-08-02 ENCOUNTER — Emergency Department (HOSPITAL_COMMUNITY)
Admission: EM | Admit: 2018-08-02 | Discharge: 2018-08-02 | Disposition: A | Discharge status: Home / Self Care | Payer: Medicare Other | Attending: Emergency Medicine | Admitting: Emergency Medicine

## 2018-08-02 ENCOUNTER — Emergency Department (HOSPITAL_COMMUNITY): Payer: Medicare Other

## 2018-08-02 ENCOUNTER — Encounter: Payer: Self-pay | Admitting: Nurse Practitioner

## 2018-08-02 DIAGNOSIS — E1140 Type 2 diabetes mellitus with diabetic neuropathy, unspecified: Secondary | ICD-10-CM | POA: Insufficient documentation

## 2018-08-02 DIAGNOSIS — Z96651 Presence of right artificial knee joint: Secondary | ICD-10-CM | POA: Diagnosis not present

## 2018-08-02 DIAGNOSIS — Z789 Other specified health status: Secondary | ICD-10-CM

## 2018-08-02 DIAGNOSIS — Z87891 Personal history of nicotine dependence: Secondary | ICD-10-CM | POA: Insufficient documentation

## 2018-08-02 DIAGNOSIS — E039 Hypothyroidism, unspecified: Secondary | ICD-10-CM | POA: Insufficient documentation

## 2018-08-02 DIAGNOSIS — Z992 Dependence on renal dialysis: Secondary | ICD-10-CM | POA: Diagnosis not present

## 2018-08-02 DIAGNOSIS — Z79899 Other long term (current) drug therapy: Secondary | ICD-10-CM | POA: Diagnosis not present

## 2018-08-02 DIAGNOSIS — E1122 Type 2 diabetes mellitus with diabetic chronic kidney disease: Secondary | ICD-10-CM | POA: Insufficient documentation

## 2018-08-02 DIAGNOSIS — R0602 Shortness of breath: Secondary | ICD-10-CM | POA: Diagnosis not present

## 2018-08-02 DIAGNOSIS — Z794 Long term (current) use of insulin: Secondary | ICD-10-CM | POA: Diagnosis not present

## 2018-08-02 DIAGNOSIS — Z96652 Presence of left artificial knee joint: Secondary | ICD-10-CM | POA: Diagnosis not present

## 2018-08-02 DIAGNOSIS — R0600 Dyspnea, unspecified: Secondary | ICD-10-CM | POA: Diagnosis not present

## 2018-08-02 DIAGNOSIS — N186 End stage renal disease: Secondary | ICD-10-CM | POA: Insufficient documentation

## 2018-08-02 DIAGNOSIS — R51 Headache: Secondary | ICD-10-CM | POA: Insufficient documentation

## 2018-08-02 DIAGNOSIS — R110 Nausea: Secondary | ICD-10-CM | POA: Insufficient documentation

## 2018-08-02 LAB — CBC WITH DIFFERENTIAL/PLATELET
Abs Immature Granulocytes: 0.07 10*3/uL (ref 0.00–0.07)
Basophils Absolute: 0.1 10*3/uL (ref 0.0–0.1)
Basophils Relative: 1 %
Eosinophils Absolute: 0.2 10*3/uL (ref 0.0–0.5)
Eosinophils Relative: 2 %
HCT: 39.3 % (ref 36.0–46.0)
Hemoglobin: 12.3 g/dL (ref 12.0–15.0)
Immature Granulocytes: 1 %
Lymphocytes Relative: 16 %
Lymphs Abs: 1.9 10*3/uL (ref 0.7–4.0)
MCH: 30.1 pg (ref 26.0–34.0)
MCHC: 31.3 g/dL (ref 30.0–36.0)
MCV: 96.1 fL (ref 80.0–100.0)
Monocytes Absolute: 1.1 10*3/uL — ABNORMAL HIGH (ref 0.1–1.0)
Monocytes Relative: 9 %
Neutro Abs: 8.4 10*3/uL — ABNORMAL HIGH (ref 1.7–7.7)
Neutrophils Relative %: 71 %
Platelets: 188 10*3/uL (ref 150–400)
RBC: 4.09 MIL/uL (ref 3.87–5.11)
RDW: 17.1 % — ABNORMAL HIGH (ref 11.5–15.5)
WBC: 11.6 10*3/uL — ABNORMAL HIGH (ref 4.0–10.5)
nRBC: 0 % (ref 0.0–0.2)

## 2018-08-02 MED ORDER — ONDANSETRON HCL 4 MG PO TABS: 4.0000 mg | ORAL_TABLET | Freq: Four times a day (QID) | ORAL | 0 refills | Status: DC

## 2018-08-02 MED ORDER — ACETAMINOPHEN 500 MG PO TABS
1000.0000 mg | ORAL_TABLET | Freq: Once | ORAL | Status: AC
  Administered 2018-08-02: 1000 mg via ORAL
  Filled 2018-08-02: qty 2

## 2018-08-02 MED ORDER — ONDANSETRON HCL 4 MG/2ML IJ SOLN
4.0000 mg | Freq: Once | INTRAMUSCULAR | Status: AC
  Administered 2018-08-02: 4 mg via INTRAVENOUS
  Filled 2018-08-02: qty 2

## 2018-08-02 NOTE — Telephone Encounter (Signed)
Joan Mosley FYI-- 

## 2018-08-02 NOTE — ED Notes (Signed)
Daughter notified that patient has been discharged.  Reports she will be here in an hour.

## 2018-08-02 NOTE — ED Provider Notes (Signed)
[facility] EMERGENCY DEPARTMENT Provider Note   CSN: [id number] Arrival date & time: 08/02/18  1552     History   Chief Complaint Chief Complaint  Patient presents with  . Shortness of Breath    HPI Joan Mosley is a 73 y.o. female.     73 year old female with prior medical history as detailed below presents for evaluation of reported dyspnea.  Patient was seen yesterday for similar complaint.  She had dialysis done as inpatient and then discharged.  Reportedly, the patient had another round of dialysis this morning.  After dialysis she developed a mild headache and had some nausea with mild dyspnea.  She arrives by EMS for reevaluation.  Upon my evaluation she is primarily complaining of mild nausea.  She is otherwise without significant complaint.  She is on her baseline O2 via nasal cannula.  She denies new fever or significant change in her symptoms versus her evaluation from 24 hours previously.  The history is provided by the patient and medical records.  Shortness of Breath Severity:  Mild Onset quality:  Unable to specify Timing:  Constant Progression:  Waxing and waning Chronicity:  New Relieved by:  Nothing Worsened by:  Nothing Associated symptoms: no fever     Past Medical History:  Diagnosis Date  . Arthritis   . COPD (chronic obstructive pulmonary disease) ([place])   . Diabetes mellitus without complication ([place])   . Oxygen dependent 03/30/2018   PT uses O2  all the time  . Renal disorder    ESRD  . Thyroid disease     Patient Active Problem List   Diagnosis Date Noted  . Anxiety 07/04/2018  . Counseling regarding advanced directives and goals of care 07/01/2018  . Full code status 07/01/2018  . Drug-induced constipation 07/01/2018  . Hemodialysis-associated hypotension 07/01/2018  . Dialysis patient ([place]) 07/01/2018  . HOH (hard of hearing) 07/01/2018  . Stage 5 chronic kidney disease on chronic dialysis ([place]) 07/01/2018  . Type  2 diabetes mellitus with diabetic polyneuropathy, with long-term current use of insulin ([place]) 07/01/2018  . Hypothyroidism 07/01/2018  . Gait instability 07/01/2018  . Hyperkalemia 04/20/2018    Past Surgical History:  Procedure Laterality Date  . ABDOMINAL HYSTERECTOMY    . RECTOCELE REPAIR    . REPLACEMENT TOTAL KNEE BILATERAL Bilateral 2008  . THYROIDECTOMY     80%  . VAGINAL WOUND CLOSURE / REPAIR       OB History   No obstetric history on file.      Home Medications    Prior to Admission medications   Medication Sig Start Date End Date Taking? Authorizing Provider  acetaminophen (TYLENOL) 500 MG tablet Take 1,000 mg by mouth every 6 (six) hours as needed for mild pain.    [provider]  albuterol (VENTOLIN HFA) 108 (90 Base) MCG/ACT inhaler Inhale 2 puffs into the lungs every 6 (six) hours as needed for wheezing or shortness of breath.    [provider]  aspirin (BAYER LOW DOSE) 81 MG EC tablet Take 81 mg by mouth daily. Swallow whole.    [provider]  calcitRIOL (ROCALTROL) 0.25 MCG capsule Take 0.25 mcg by mouth daily.    [provider]  citalopram (CELEXA) 20 MG tablet Take 1 tablet (20 mg total) by mouth daily. AM 07/04/18   Nche, Charlene Brooke, NP  clonazePAM (KLONOPIN) 0.5 MG tablet Take 1 tablet (0.5 mg total) by mouth 2 (two) times daily. 1 AM and 1  Pm 07/04/18   Nche, Charlene Brooke, NP  clopidogrel (PLAVIX) 75 MG tablet Take 1 tablet (75 mg total) by mouth daily. 07/04/18   Nche, Charlene Brooke, NP  furosemide (LASIX) 80 MG tablet Take 80 mg by mouth See admin instructions. Sunday, Monday,Wed,Friday--in AM     [provider]  insulin detemir (LEVEMIR) 100 UNIT/ML injection Inject 0.15 mLs (15 Units total) into the skin daily. 07/04/18   Nche, Charlene Brooke, NP  insulin lispro (HUMALOG) 100 UNIT/ML injection Inject 1-5 Units into the skin 3 (three) times daily with meals as needed for high blood sugar. Sliding scale      [provider]  ipratropium (ATROVENT) 0.02 % nebulizer solution Inhale 0.5 mg into the lungs 4 (four) times daily.     [provider]  levothyroxine (SYNTHROID) 112 MCG tablet Take 1 tablet (112 mcg total) by mouth daily before breakfast. 1 AM 07/04/18   Nche, Charlene Brooke, NP  lidocaine (LIDODERM) 5 % Place 1 patch onto the skin every 12 (twelve) hours as needed (back pain). Remove & Discard patch within 12 hours or as directed by MD     [provider]  midodrine (PROAMATINE) 10 MG tablet Take 10 mg by mouth 3 (three) times daily. Taking on Dialysis days, Tues, Thur, Sat.    [provider]  montelukast (SINGULAIR) 10 MG tablet Take 10 mg by mouth at bedtime. PM    [provider]  nystatin (MYCOSTATIN/NYSTOP) powder Apply 1 g topically 2 (two) times daily as needed (skin rash).     [provider]  nystatin cream (MYCOSTATIN) Apply 1 application topically 2 (two) times daily as needed for dry skin.     [provider]  ondansetron (ZOFRAN) 4 MG tablet Take 1 tablet (4 mg total) by mouth every 6 (six) hours. 08/02/18   Valarie Merino, MD  rosuvastatin (CRESTOR) 40 MG tablet Take 40 mg by mouth daily. AM    [provider]  senna-docusate (SENNA PLUS) 8.6-50 MG tablet Take 1 tablet by mouth at bedtime. 07/04/18   Nche, Charlene Brooke, NP  sevelamer (RENAGEL) 800 MG tablet Take 1,600 mg by mouth 3 (three) times daily with meals. 1600 mg --3 times daily--take additional 1-2 if eats snack    [provider]    Family History Family History  Problem Relation Age of Onset  . Heart disease Mother   . Hypertension Mother   . Heart disease Father   . Hypertension Father     Social History Social History   Tobacco Use  . Smoking status: Former Smoker    Quit date: 03/29/1981    Years since quitting: 37.3  . Smokeless tobacco: Never Used  Substance Use Topics  . Alcohol use: Never    Frequency: Never  . Drug use:  Never     Allergies   Avelox [moxifloxacin hcl in nacl], Codeine, Other, Penicillins, Sulfa antibiotics, and Shellfish allergy   Review of Systems Review of Systems  Constitutional: Negative for fever.  Respiratory: Positive for shortness of breath.   All other systems reviewed and are negative.    Physical Exam Updated Vital Signs BP 114/80   Pulse 94   Temp 98.3 F (36.8 C) (Oral)   Resp 18   SpO2 95%   Physical Exam Vitals signs and nursing note reviewed.  Constitutional:      General: She is not in acute distress.    Appearance: She is well-developed.  HENT:  Head: Normocephalic and atraumatic.  Eyes:     Conjunctiva/sclera: Conjunctivae normal.     Pupils: Pupils are equal, round, and reactive to light.  Neck:     Musculoskeletal: Normal range of motion and neck supple.  Cardiovascular:     Rate and Rhythm: Normal rate and regular rhythm.     Heart sounds: Normal heart sounds.  Pulmonary:     Effort: Pulmonary effort is normal. No respiratory distress.     Breath sounds: Normal breath sounds.  Abdominal:     General: There is no distension.     Palpations: Abdomen is soft.     Tenderness: There is no abdominal tenderness.  Musculoskeletal: Normal range of motion.        General: No deformity.  Skin:    General: Skin is warm and dry.  Neurological:     Mental Status: She is alert and oriented to person, place, and time.     Motor: No weakness.      ED Treatments / Results  Labs (all labs ordered are listed, but only abnormal results are displayed) Labs Reviewed  COMPREHENSIVE METABOLIC PANEL - Abnormal; Notable for the following components:      Result Value   Chloride 93 (*)    Creatinine, Ser 3.71 (*)    Total Bilirubin 1.6 (*)    GFR calc non Af Amer 11 (*)    GFR calc Af Amer 13 (*)    Anion gap 17 (*)    All other components within normal limits  CBC WITH DIFFERENTIAL/PLATELET - Abnormal; Notable for the following components:   WBC  11.6 (*)    RDW 17.1 (*)    Neutro Abs 8.4 (*)    Monocytes Absolute 1.1 (*)    All other components within normal limits    EKG EKG Interpretation  Date/Time:  Tuesday August 02 2018 16:50:44 EDT Ventricular Rate:  95 PR Interval:    QRS Duration: 153 QT Interval:  413 QTC Calculation: 520 R Axis:   -30 Text Interpretation:  Sinus rhythm LVH with secondary repolarization abnormality Prolonged QT interval Confirmed by Dene Gentry [phone]) on 08/02/2018 5:00:38 PM   Radiology Dg Chest 2 View  Result Date: 08/01/2018 CLINICAL DATA:  Shortness of breath. EXAM: CHEST - 2 VIEW COMPARISON:  05/18/2018 and 04/19/2018 FINDINGS: Heart size is normal. Pulmonary vascular congestion at the bases posteriorly best seen on the lateral view. Central venous dialysis catheter in place, unchanged. No acute bone abnormality. IMPRESSION: Pulmonary vascular congestion at the lung bases. Electronically Signed   By: [name] M.D.   On: 08/01/2018 05:35   Dg Chest Port 1 View  Result Date: 08/02/2018 CLINICAL DATA:  Weakness. EXAM: PORTABLE CHEST 1 VIEW COMPARISON:  08/01/2018 FINDINGS: The patient is again rotated to the right. A right jugular dialysis catheter is unchanged. The cardiomediastinal silhouette is unchanged pulmonary vascular congestion is similar to the prior study. No overt edema, airspace consolidation, sizeable pleural effusion, or pneumothorax is identified. No acute osseous abnormality is seen. IMPRESSION: Unchanged pulmonary vascular congestion. Electronically Signed   By: Logan Bores M.D.   On: 08/02/2018 17:05    Procedures Procedures (including critical care time)  Medications Ordered in ED Medications  ondansetron (ZOFRAN) injection 4 mg (4 mg Intravenous Given 08/02/18 1704)  acetaminophen (TYLENOL) tablet 1,000 mg (1,000 mg Oral Given 08/02/18 1701)     Initial Impression / Assessment and Plan / ED Course  I have reviewed the triage vital signs and the  nursing notes.   Pertinent labs & imaging results that were available during my care of the patient were reviewed by me and considered in my medical decision making (see chart for details).       MDM  Screen complete  Reida Hem was evaluated in Emergency Department on 08/02/2018 for the symptoms described in the history of present illness. She was evaluated in the context of the global COVID-19 pandemic, which necessitated consideration that the patient might be at risk for infection with the SARS-CoV-2 virus that causes COVID-19. Institutional protocols and algorithms that pertain to the evaluation of patients at risk for COVID-19 are in a state of rapid change based on information released by regulatory bodies including the CDC and federal and state organizations. These policies and algorithms were followed during the patient's care in the ED.  Patient is presenting for reevaluation following a recent visit to the ED yesterday.  Her symptoms today appear to be more of a continuance from her visit yesterday.  Reevaluation of her labs does not reveal significant abnormality.  She was given a dose of Tylenol and Zofran for her symptoms.  Following her ED evaluation today she feels significantly improved.  She desires discharge.  She does understand the need for close follow-up.  Strict return precautions given and understood.   Final Clinical Impressions(s) / ED Diagnoses   Final diagnoses:  Dyspnea, unspecified type    ED Discharge Orders         Ordered    ondansetron (ZOFRAN) 4 MG tablet  Every 6 hours     08/02/18 1850           Valarie Merino, MD 08/02/18 [id number]

## 2018-08-02 NOTE — ED Triage Notes (Addendum)
Pt brought in by ems from home for c/o sob and cough ; pt states she was just discharged from the hospital on sat for CHF exacerbation  And has not felt better since she was discharged ; denies any chest pain ; pt denies any fever ; pt usually wears 5 L of [place] at home ; pt was able to complete HD today ; pt reports chills

## 2018-08-02 NOTE — Telephone Encounter (Signed)
Pt and son [name]" on line, calling to report pt went to ED yesterday, 'Hyperkalemia'; dialyzed  in ED. Calling today stating pt feels no better. States headache 9/10 after tylenol, chills,no appetite, loss of sense of taste, nausea. Home O2 at 6 Ls, sats at 98%.  Reports SOB at  Rest, increased weakness. Covid 19 negative at ED yesterday. Pt directed to ED. Verbalizes understanding, will follow disposition.  Reason for Disposition . Patient sounds very sick or weak to the triager  Protocols used: HEADACHE-A-AH

## 2018-08-02 NOTE — Discharge Instructions (Signed)
Please return for any problem.  Follow-up with your regular care provider as instructed. °

## 2018-08-03 LAB — COMPREHENSIVE METABOLIC PANEL
ALT: 12 U/L (ref 0–44)
AST: 20 U/L (ref 15–41)
Albumin: 3.7 g/dL (ref 3.5–5.0)
Alkaline Phosphatase: 97 U/L (ref 38–126)
Anion gap: 17 — ABNORMAL HIGH (ref 5–15)
BUN: 12 mg/dL (ref 8–23)
CO2: 25 mmol/L (ref 22–32)
Calcium: 9 mg/dL (ref 8.9–10.3)
Chloride: 93 mmol/L — ABNORMAL LOW (ref 98–111)
Creatinine, Ser: 3.71 mg/dL — ABNORMAL HIGH (ref 0.44–1.00)
GFR calc Af Amer: 13 mL/min — ABNORMAL LOW (ref >60)
GFR calc non Af Amer: 11 mL/min — ABNORMAL LOW (ref >60)
Glucose, Bld: 96 mg/dL (ref 70–99)
Potassium: 4.3 mmol/L (ref 3.5–5.1)
Sodium: 135 mmol/L (ref 135–145)
Total Bilirubin: 1.6 mg/dL — ABNORMAL HIGH (ref 0.3–1.2)
Total Protein: 7.2 g/dL (ref 6.5–8.1)

## 2018-08-04 ENCOUNTER — Telehealth: Payer: Self-pay | Admitting: Nurse Practitioner

## 2018-08-04 DIAGNOSIS — F419 Anxiety disorder, unspecified: Secondary | ICD-10-CM

## 2018-08-04 NOTE — Telephone Encounter (Signed)
Pts son called and is requesting to know if pts medical records had been received bvy PCP. PTs son also states pt is needing a refill of clonazepam and is requesting it be for 1 mg tablet instead of .5mg  tablet. PTs son states she has no more of medication. Please advise .   [facility] [phone] - [address], [address]  [place] [address]  Phone: [phone] Fax: [phone]  Not a 24 hour pharmacy; exact hours not known.

## 2018-08-05 MED ORDER — CLONAZEPAM 0.5 MG PO TABS: 0.5000 mg | ORAL_TABLET | Freq: Three times a day (TID) | ORAL | 0 refills | Status: DC | PRN

## 2018-08-05 MED ORDER — CLONAZEPAM 1 MG PO TABS: 0.5000 mg | ORAL_TABLET | Freq: Two times a day (BID) | ORAL | 1 refills | Status: DC | PRN

## 2018-08-05 NOTE — Telephone Encounter (Signed)
Pt has an appt on with [facility] for dosage consult

## 2018-08-05 NOTE — Telephone Encounter (Signed)
Baldo Ash please advise, record came already, I put on your desk.

## 2018-08-05 NOTE — Telephone Encounter (Signed)
I just received records from [facility], APRN. Record indicate use of klonopin 1mg  every BID prn for anxiety. Rx last sent 01/2017 and 05/2017. I sent 30tabs with 1refill. prescription can only be filled once every 30days. If she is needing medication on daily basis we need to re eval citalopram dose. Schedule virtual visit if needed

## 2018-08-05 NOTE — Addendum Note (Signed)
Addended by: Wilfred Lacy L on: 08/05/2018 01:12 PM   Modules accepted: Orders

## 2018-08-05 NOTE — Telephone Encounter (Signed)
Record received are from dialysis center in [place]. Did not get any records from previous pcp. At this time, maintain klonopin 0.5mg  and dose increased to every 8hrsprn. With COPD there is a risk of oversedation, so I do not recommend a higher dose at this time.

## 2018-08-08 ENCOUNTER — Encounter: Payer: Self-pay | Admitting: Nurse Practitioner

## 2018-08-08 ENCOUNTER — Other Ambulatory Visit: Payer: Self-pay

## 2018-08-08 ENCOUNTER — Ambulatory Visit (INDEPENDENT_AMBULATORY_CARE_PROVIDER_SITE_OTHER): Payer: Medicare Other | Admitting: Nurse Practitioner

## 2018-08-08 VITALS — Ht 61.0 in

## 2018-08-08 DIAGNOSIS — F419 Anxiety disorder, unspecified: Secondary | ICD-10-CM

## 2018-08-08 MED ORDER — CLONAZEPAM 1 MG PO TABS: 1.0000 mg | ORAL_TABLET | Freq: Two times a day (BID) | ORAL | 2 refills | Status: DC | PRN

## 2018-08-08 MED ORDER — FLUOXETINE HCL 10 MG PO TABS: 10.0000 mg | ORAL_TABLET | Freq: Every day | ORAL | 5 refills | Status: DC

## 2018-08-08 NOTE — Assessment & Plan Note (Signed)
use of klonopin 1mg  BID for 2years, previous prescribed by previous pcp in [place]. Used alprazolam in past (was ineffective) Use of celexa 20mg  for 3years. Can not tell difference with medication.  Maintain klonopin at 1mg  BID Change citalopram to prozac 10mg  F/up in 55month. Increase prozac dose to 20mg  if no adverse reaction

## 2018-08-08 NOTE — Progress Notes (Signed)
Virtual Visit via Video Note  I connected with Joan Mosley on 08/08/18 at 10:15 AM EDT by a video enabled telemedicine application and verified that I am speaking with the correct person using two identifiers.  Location: Patient: Home Provider:Office Son, daughter and [place] present during video call.  I discussed the limitations of evaluation and management by telemedicine and the availability of in person appointments. The patient expressed understanding and agreed to proceed.  CC: anxiety  History of Present Illness: Anxiety Presents for initial visit. Onset was more than 5 years ago. The problem has been unchanged. Symptoms include depressed mood, excessive worry, hyperventilation, irritability, nervous/anxious behavior, palpitations, panic and restlessness. Patient reports no muscle tension, nausea or suicidal ideas. Symptoms occur most days. The severity of symptoms is causing significant distress and interfering with daily activities. Exacerbated by: unknown.   Risk factors include a major life event (declining health). Her past medical history is significant for anxiety/panic attacks, chronic lung disease and depression. There is no history of hyperthyroidism or suicide attempts. Past treatments include benzodiazephines and SSRIs. The treatment provided moderate relief. Compliance with prior treatments has been good.  use of klonopin 1mg  BID for 2years, previous prescribed by previous pcp in [place]. Used alprazolam in past (was ineffective) Use of celexa at current dose for 3years   Observations/Objective: Physical Exam  Constitutional: She is oriented to person, place, and time. No distress.  Pulmonary/Chest: Effort normal.  Neurological: She is alert and oriented to person, place, and time.  Skin: Skin is warm and dry.  Psychiatric: She has a normal mood and affect. Her behavior is normal. Thought content normal.   Assessment and Plan: Hser was seen today for  medication problem.  Diagnoses and all orders for this visit:  Anxiety -     clonazePAM (KLONOPIN) 1 MG tablet; Take 1 tablet (1 mg total) by mouth 2 (two) times daily as needed for anxiety. Change in dose -     FLUoxetine (PROZAC) 10 MG tablet; Take 1 tablet (10 mg total) by mouth daily.   Follow Up Instructions: Patient and family advised about risk of oversedation and suppression of respiration with use of klonopin. Also advised that current dose will not be increased at any time. Patient and family verbalized understanding. Maintain upcoming appt 09/2018 as scheduled.  I discussed the assessment and treatment plan with the patient. The patient was provided an opportunity to ask questions and all were answered. The patient agreed with the plan and demonstrated an understanding of the instructions.   The patient was advised to call back or seek an in-person evaluation if the symptoms worsen or if the condition fails to improve as anticipated.  Wilfred Lacy, NP

## 2018-08-08 NOTE — Patient Instructions (Addendum)
Stop citalopram Start fluoxetine continue klonopin as prescribed.  Patient and family advised about risk of oversedation and suppression of respiration with use of klonopin. Also advised that current dose will not be increased at any time. Patient and family verbalized understanding. Maintain upcoming appt 09/2018 as scheduled.

## 2018-08-09 NOTE — Telephone Encounter (Signed)
This encounter was created in error - please disregard.

## 2018-08-22 ENCOUNTER — Encounter (HOSPITAL_COMMUNITY): Payer: Self-pay | Admitting: Emergency Medicine

## 2018-08-22 ENCOUNTER — Inpatient Hospital Stay (HOSPITAL_COMMUNITY)
Admission: EM | Admit: 2018-08-22 | Discharge: 2018-08-25 | DRG: 064 | Disposition: A | Discharge status: Home Health | Payer: Medicare Other | Attending: Internal Medicine | Admitting: Internal Medicine

## 2018-08-22 ENCOUNTER — Emergency Department (HOSPITAL_COMMUNITY): Payer: Medicare Other

## 2018-08-22 ENCOUNTER — Other Ambulatory Visit: Payer: Self-pay

## 2018-08-22 DIAGNOSIS — E039 Hypothyroidism, unspecified: Secondary | ICD-10-CM | POA: Diagnosis present

## 2018-08-22 DIAGNOSIS — E785 Hyperlipidemia, unspecified: Secondary | ICD-10-CM | POA: Diagnosis present

## 2018-08-22 DIAGNOSIS — E890 Postprocedural hypothyroidism: Secondary | ICD-10-CM | POA: Diagnosis present

## 2018-08-22 DIAGNOSIS — Z96653 Presence of artificial knee joint, bilateral: Secondary | ICD-10-CM | POA: Diagnosis present

## 2018-08-22 DIAGNOSIS — I6202 Nontraumatic subacute subdural hemorrhage: Secondary | ICD-10-CM | POA: Diagnosis present

## 2018-08-22 DIAGNOSIS — J449 Chronic obstructive pulmonary disease, unspecified: Secondary | ICD-10-CM | POA: Diagnosis present

## 2018-08-22 DIAGNOSIS — Z87891 Personal history of nicotine dependence: Secondary | ICD-10-CM

## 2018-08-22 DIAGNOSIS — Z888 Allergy status to other drugs, medicaments and biological substances status: Secondary | ICD-10-CM

## 2018-08-22 DIAGNOSIS — F419 Anxiety disorder, unspecified: Secondary | ICD-10-CM | POA: Diagnosis present

## 2018-08-22 DIAGNOSIS — E1142 Type 2 diabetes mellitus with diabetic polyneuropathy: Secondary | ICD-10-CM

## 2018-08-22 DIAGNOSIS — Z79890 Hormone replacement therapy (postmenopausal): Secondary | ICD-10-CM

## 2018-08-22 DIAGNOSIS — G9341 Metabolic encephalopathy: Secondary | ICD-10-CM | POA: Diagnosis present

## 2018-08-22 DIAGNOSIS — D638 Anemia in other chronic diseases classified elsewhere: Secondary | ICD-10-CM | POA: Diagnosis present

## 2018-08-22 DIAGNOSIS — M170 Bilateral primary osteoarthritis of knee: Secondary | ICD-10-CM | POA: Diagnosis present

## 2018-08-22 DIAGNOSIS — Z794 Long term (current) use of insulin: Secondary | ICD-10-CM

## 2018-08-22 DIAGNOSIS — E875 Hyperkalemia: Secondary | ICD-10-CM | POA: Diagnosis not present

## 2018-08-22 DIAGNOSIS — Z8249 Family history of ischemic heart disease and other diseases of the circulatory system: Secondary | ICD-10-CM

## 2018-08-22 DIAGNOSIS — I6200 Nontraumatic subdural hemorrhage, unspecified: Secondary | ICD-10-CM | POA: Diagnosis present

## 2018-08-22 DIAGNOSIS — Z880 Allergy status to penicillin: Secondary | ICD-10-CM

## 2018-08-22 DIAGNOSIS — I9589 Other hypotension: Secondary | ICD-10-CM | POA: Diagnosis present

## 2018-08-22 DIAGNOSIS — R1310 Dysphagia, unspecified: Secondary | ICD-10-CM | POA: Diagnosis present

## 2018-08-22 DIAGNOSIS — Z8673 Personal history of transient ischemic attack (TIA), and cerebral infarction without residual deficits: Secondary | ICD-10-CM

## 2018-08-22 DIAGNOSIS — E8889 Other specified metabolic disorders: Secondary | ICD-10-CM | POA: Diagnosis present

## 2018-08-22 DIAGNOSIS — Z90710 Acquired absence of both cervix and uterus: Secondary | ICD-10-CM

## 2018-08-22 DIAGNOSIS — S065XAA Traumatic subdural hemorrhage with loss of consciousness status unknown, initial encounter: Secondary | ICD-10-CM

## 2018-08-22 DIAGNOSIS — Z992 Dependence on renal dialysis: Secondary | ICD-10-CM | POA: Diagnosis not present

## 2018-08-22 DIAGNOSIS — E1122 Type 2 diabetes mellitus with diabetic chronic kidney disease: Secondary | ICD-10-CM | POA: Diagnosis present

## 2018-08-22 DIAGNOSIS — Z6841 Body Mass Index (BMI) 40.0 and over, adult: Secondary | ICD-10-CM

## 2018-08-22 DIAGNOSIS — Z79899 Other long term (current) drug therapy: Secondary | ICD-10-CM

## 2018-08-22 DIAGNOSIS — Z9981 Dependence on supplemental oxygen: Secondary | ICD-10-CM

## 2018-08-22 DIAGNOSIS — J9611 Chronic respiratory failure with hypoxia: Secondary | ICD-10-CM | POA: Diagnosis present

## 2018-08-22 DIAGNOSIS — R7989 Other specified abnormal findings of blood chemistry: Secondary | ICD-10-CM | POA: Diagnosis present

## 2018-08-22 DIAGNOSIS — Z91013 Allergy to seafood: Secondary | ICD-10-CM

## 2018-08-22 DIAGNOSIS — Z885 Allergy status to narcotic agent status: Secondary | ICD-10-CM

## 2018-08-22 DIAGNOSIS — Z20828 Contact with and (suspected) exposure to other viral communicable diseases: Secondary | ICD-10-CM | POA: Diagnosis present

## 2018-08-22 DIAGNOSIS — Z7902 Long term (current) use of antithrombotics/antiplatelets: Secondary | ICD-10-CM

## 2018-08-22 DIAGNOSIS — N186 End stage renal disease: Secondary | ICD-10-CM | POA: Diagnosis present

## 2018-08-22 DIAGNOSIS — R531 Weakness: Secondary | ICD-10-CM | POA: Diagnosis present

## 2018-08-22 DIAGNOSIS — N2581 Secondary hyperparathyroidism of renal origin: Secondary | ICD-10-CM | POA: Diagnosis present

## 2018-08-22 DIAGNOSIS — S065X9A Traumatic subdural hemorrhage with loss of consciousness of unspecified duration, initial encounter: Secondary | ICD-10-CM | POA: Diagnosis present

## 2018-08-22 DIAGNOSIS — I1311 Hypertensive heart and chronic kidney disease without heart failure, with stage 5 chronic kidney disease, or end stage renal disease: Secondary | ICD-10-CM | POA: Diagnosis present

## 2018-08-22 DIAGNOSIS — Z882 Allergy status to sulfonamides status: Secondary | ICD-10-CM

## 2018-08-22 DIAGNOSIS — R778 Other specified abnormalities of plasma proteins: Secondary | ICD-10-CM | POA: Diagnosis present

## 2018-08-22 HISTORY — DX: Type 2 diabetes mellitus with diabetic neuropathy, unspecified: E11.40

## 2018-08-22 HISTORY — DX: Transient cerebral ischemic attack, unspecified: G45.9

## 2018-08-22 LAB — COMPREHENSIVE METABOLIC PANEL
ALT: 10 U/L (ref 0–44)
AST: 10 U/L — ABNORMAL LOW (ref 15–41)
Albumin: 3.3 g/dL — ABNORMAL LOW (ref 3.5–5.0)
Alkaline Phosphatase: 72 U/L (ref 38–126)
Anion gap: 16 — ABNORMAL HIGH (ref 5–15)
BUN: 46 mg/dL — ABNORMAL HIGH (ref 8–23)
CO2: 27 mmol/L (ref 22–32)
Calcium: 8.9 mg/dL (ref 8.9–10.3)
Chloride: 90 mmol/L — ABNORMAL LOW (ref 98–111)
Creatinine, Ser: 9.29 mg/dL — ABNORMAL HIGH (ref 0.44–1.00)
GFR calc Af Amer: 4 mL/min — ABNORMAL LOW (ref >60)
GFR calc non Af Amer: 4 mL/min — ABNORMAL LOW (ref >60)
Glucose, Bld: 107 mg/dL — ABNORMAL HIGH (ref 70–99)
Potassium: 5 mmol/L (ref 3.5–5.1)
Sodium: 133 mmol/L — ABNORMAL LOW (ref 135–145)
Total Bilirubin: 0.9 mg/dL (ref 0.3–1.2)
Total Protein: 6.7 g/dL (ref 6.5–8.1)

## 2018-08-22 LAB — CBC WITH DIFFERENTIAL/PLATELET
Abs Immature Granulocytes: 0.02 10*3/uL (ref 0.00–0.07)
Basophils Absolute: 0 10*3/uL (ref 0.0–0.1)
Basophils Relative: 0 %
Eosinophils Absolute: 0.3 10*3/uL (ref 0.0–0.5)
Eosinophils Relative: 3 %
HCT: 32.5 % — ABNORMAL LOW (ref 36.0–46.0)
Hemoglobin: 10.1 g/dL — ABNORMAL LOW (ref 12.0–15.0)
Immature Granulocytes: 0 %
Lymphocytes Relative: 19 %
Lymphs Abs: 1.8 10*3/uL (ref 0.7–4.0)
MCH: 30 pg (ref 26.0–34.0)
MCHC: 31.1 g/dL (ref 30.0–36.0)
MCV: 96.4 fL (ref 80.0–100.0)
Monocytes Absolute: 0.9 10*3/uL (ref 0.1–1.0)
Monocytes Relative: 9 %
Neutro Abs: 6.7 10*3/uL (ref 1.7–7.7)
Neutrophils Relative %: 69 %
Platelets: 188 10*3/uL (ref 150–400)
RBC: 3.37 MIL/uL — ABNORMAL LOW (ref 3.87–5.11)
RDW: 15.7 % — ABNORMAL HIGH (ref 11.5–15.5)
WBC: 9.8 10*3/uL (ref 4.0–10.5)
nRBC: 0 % (ref 0.0–0.2)

## 2018-08-22 LAB — PROTIME-INR
INR: 1 (ref 0.8–1.2)
Prothrombin Time: 13.2 seconds (ref 11.4–15.2)

## 2018-08-22 LAB — TROPONIN I (HIGH SENSITIVITY): Troponin I (High Sensitivity): 20 ng/L — ABNORMAL HIGH (ref <18)

## 2018-08-22 LAB — POCT I-STAT 7, (LYTES, BLD GAS, ICA,H+H)
Acid-Base Excess: 4 mmol/L — ABNORMAL HIGH (ref 0.0–2.0)
Bicarbonate: 28.1 mmol/L — ABNORMAL HIGH (ref 20.0–28.0)
Calcium, Ion: 1.1 mmol/L — ABNORMAL LOW (ref 1.15–1.40)
HCT: 28 % — ABNORMAL LOW (ref 36.0–46.0)
Hemoglobin: 9.5 g/dL — ABNORMAL LOW (ref 12.0–15.0)
O2 Saturation: 99 %
Patient temperature: 98.7
Potassium: 4.8 mmol/L (ref 3.5–5.1)
Sodium: 130 mmol/L — ABNORMAL LOW (ref 135–145)
TCO2: 29 mmol/L (ref 22–32)
pCO2 arterial: 41.5 mmHg (ref 32.0–48.0)
pH, Arterial: 7.439 (ref 7.350–7.450)
pO2, Arterial: 122 mmHg — ABNORMAL HIGH (ref 83.0–108.0)

## 2018-08-22 LAB — CBG MONITORING, ED: Glucose-Capillary: 82 mg/dL (ref 70–99)

## 2018-08-22 LAB — MAGNESIUM: Magnesium: 2.7 mg/dL — ABNORMAL HIGH (ref 1.7–2.4)

## 2018-08-22 MED ORDER — HYDRALAZINE HCL 20 MG/ML IJ SOLN: 10.0000 mg | Freq: Four times a day (QID) | INTRAMUSCULAR | Status: DC | PRN

## 2018-08-22 MED ORDER — SODIUM CHLORIDE 0.9% FLUSH: 3.0000 mL | INTRAVENOUS | Status: DC | PRN

## 2018-08-22 MED ORDER — INSULIN ASPART 100 UNIT/ML ~~LOC~~ SOLN
0.0000 [IU] | SUBCUTANEOUS | Status: DC
  Administered 2018-08-23 – 2018-08-24 (×2): 1 [IU] via SUBCUTANEOUS

## 2018-08-22 MED ORDER — ACETAMINOPHEN 650 MG RE SUPP: 650.0000 mg | Freq: Four times a day (QID) | RECTAL | Status: DC | PRN

## 2018-08-22 MED ORDER — SODIUM CHLORIDE 0.9% FLUSH
3.0000 mL | Freq: Two times a day (BID) | INTRAVENOUS | Status: DC
  Administered 2018-08-23 – 2018-08-25 (×5): 3 mL via INTRAVENOUS

## 2018-08-22 MED ORDER — ACETAMINOPHEN 325 MG PO TABS
650.0000 mg | ORAL_TABLET | Freq: Four times a day (QID) | ORAL | Status: DC | PRN
  Administered 2018-08-24: 650 mg via ORAL
  Filled 2018-08-22: qty 2

## 2018-08-22 MED ORDER — SODIUM CHLORIDE 0.9 % IV SOLN: 250.0000 mL | INTRAVENOUS | Status: DC | PRN

## 2018-08-22 NOTE — ED Notes (Signed)
RN attempted for IV x2 with no success.

## 2018-08-22 NOTE — ED Notes (Signed)
ED TO INPATIENT HANDOFF REPORT  ED Nurse Name and Phone #: Myriam Jacobson [phone]  S Name/Age/Gender Joan Mosley 73 y.o. female Room/Bed: 021C/021C  Code Status   Code Status: Full Code  Home/SNF/Other Home Patient oriented to: self, place, time and situation Is this baseline? Yes   Triage Complete: Triage complete  Chief Complaint AMS lethargy  Triage Note EMS reports lethargy and tremors since this AM, right hand tremors. Never before witnessed by pt. HXt of Dialysis, copd, chf. Dialysis scheduled for tomorrow. EMS vitals: 98hr, 99%o2, 157/80, 140cbg, 98.2*f, 20RR. Denies N/V/D.    Allergies Allergies  Allergen Reactions  . Avelox [Moxifloxacin Hcl In Nacl]     PT does not remember  . Codeine Anaphylaxis    PT does not remember  . Other     PT does not remember  . Penicillins     Rash  . Sulfa Antibiotics     PT does nolt remember  . Shellfish Allergy Hives, Itching and Rash    Level of Care/Admitting Diagnosis ED Disposition    ED Disposition Condition [facility] Area: [place] [[id number]]  Level of Care: Progressive [102]  Covid Evaluation: Asymptomatic Screening Protocol (No Symptoms)  Diagnosis: Subdural hematoma [facility]) [[id number]]  Admitting Physician: Jani Gravel [[id number]]  Attending Physician: Jani Gravel [phone]  Estimated length of stay: past midnight tomorrow  Certification:: I certify this patient will need inpatient services for at least 2 midnights  PT Class (Do Not Modify): Inpatient [101]  PT Acc Code (Do Not Modify): Private [1]       B Medical/Surgery History Past Medical History:  Diagnosis Date  . Arthritis   . COPD (chronic obstructive pulmonary disease) ([place])   . Diabetes mellitus without complication ([place])   . Diabetic neuropathy ([place])   . Oxygen dependent 03/30/2018   PT uses O2  all the time  . Renal disorder    ESRD  . Thyroid disease   . TIA (transient ischemic attack)    Past Surgical History:   Procedure Laterality Date  . ABDOMINAL HYSTERECTOMY    . RECTOCELE REPAIR    . REPLACEMENT TOTAL KNEE BILATERAL Bilateral 2008  . THYROIDECTOMY     80%  . VAGINAL WOUND CLOSURE / REPAIR       A IV Location/Drains/Wounds Patient Lines/Drains/Airways Status   Active Line/Drains/Airways    Name:   Placement date:   Placement time:   Site:   Days:   Peripheral IV 08/22/18 Right;Upper Arm   08/22/18    2138    Arm   less than 1   Hemodialysis Catheter Right Subclavian Double-lumen   -    -    Subclavian             Intake/Output Last 24 hours No intake or output data in the 24 hours ending 08/22/18 2242  Labs/Imaging Results for orders placed or performed during the hospital encounter of 08/22/18 (from the past 48 hour(s))  CBG monitoring, ED     Status: None   Collection Time: 08/22/18  7:20 PM  Result Value Ref Range   Glucose-Capillary 82 70 - 99 mg/dL  CBC with Differential     Status: Abnormal   Collection Time: 08/22/18  7:24 PM  Result Value Ref Range   WBC 9.8 4.0 - 10.5 K/uL   RBC 3.37 (L) 3.87 - 5.11 MIL/uL   Hemoglobin 10.1 (L) 12.0 - 15.0 g/dL   HCT 32.5 (L) 36.0 - 46.0 %  MCV 96.4 80.0 - 100.0 fL   MCH 30.0 26.0 - 34.0 pg   MCHC 31.1 30.0 - 36.0 g/dL   RDW 15.7 (H) 11.5 - 15.5 %   Platelets 188 150 - 400 K/uL   nRBC 0.0 0.0 - 0.2 %   Neutrophils Relative % 69 %   Neutro Abs 6.7 1.7 - 7.7 K/uL   Lymphocytes Relative 19 %   Lymphs Abs 1.8 0.7 - 4.0 K/uL   Monocytes Relative 9 %   Monocytes Absolute 0.9 0.1 - 1.0 K/uL   Eosinophils Relative 3 %   Eosinophils Absolute 0.3 0.0 - 0.5 K/uL   Basophils Relative 0 %   Basophils Absolute 0.0 0.0 - 0.1 K/uL   Immature Granulocytes 0 %   Abs Immature Granulocytes 0.02 0.00 - 0.07 K/uL    Comment: Performed at [facility] [address] [place], [place] [address]  Comprehensive metabolic panel     Status: Abnormal   Collection Time: 08/22/18  7:24 PM  Result Value Ref Range   Sodium 133 (L) 135 - 145  mmol/L   Potassium 5.0 3.5 - 5.1 mmol/L   Chloride 90 (L) 98 - 111 mmol/L   CO2 27 22 - 32 mmol/L   Glucose, Bld 107 (H) 70 - 99 mg/dL   BUN 46 (H) 8 - 23 mg/dL   Creatinine, Ser 9.29 (H) 0.44 - 1.00 mg/dL   Calcium 8.9 8.9 - 10.3 mg/dL   Total Protein 6.7 6.5 - 8.1 g/dL   Albumin 3.3 (L) 3.5 - 5.0 g/dL   AST 10 (L) 15 - 41 U/L   ALT 10 0 - 44 U/L   Alkaline Phosphatase 72 38 - 126 U/L   Total Bilirubin 0.9 0.3 - 1.2 mg/dL   GFR calc non Af Amer 4 (L) >60 mL/min   GFR calc Af Amer 4 (L) >60 mL/min   Anion gap 16 (H) 5 - 15    Comment: Performed at [facility], [place] [address], [place], [place] [address]  Protime-INR     Status: None   Collection Time: 08/22/18  7:24 PM  Result Value Ref Range   Prothrombin Time 13.2 11.4 - 15.2 seconds   INR 1.0 0.8 - 1.2    Comment: (NOTE) INR goal varies based on device and disease states. Performed at [facility], [place] [address], [place], [place] [address]   Troponin I (High Sensitivity)     Status: Abnormal   Collection Time: 08/22/18  7:24 PM  Result Value Ref Range   Troponin I (High Sensitivity) 20 (H) <18 ng/L    Comment: (NOTE) Elevated high sensitivity troponin I (hsTnI) values and significant  changes across serial measurements may suggest ACS but many other  chronic and acute conditions are known to elevate hsTnI results.  Refer to the "Links" section for chest pain algorithms and additional  guidance. Performed at [facility], [place] [address], [place], [place] [address]   Magnesium     Status: Abnormal   Collection Time: 08/22/18  7:24 PM  Result Value Ref Range   Magnesium 2.7 (H) 1.7 - 2.4 mg/dL    Comment: Performed at [facility] [address], [place], [place] [address]  I-STAT 7, (LYTES, BLD GAS, ICA, H+H)     Status: Abnormal   Collection Time: 08/22/18  8:15 PM  Result Value Ref Range   pH, Arterial 7.439 7.350 - 7.450   pCO2 arterial  41.5 32.0 - 48.0 mmHg   pO2, Arterial 122.0 (H)  83.0 - 108.0 mmHg   Bicarbonate 28.1 (H) 20.0 - 28.0 mmol/L   TCO2 29 22 - 32 mmol/L   O2 Saturation 99.0 %   Acid-Base Excess 4.0 (H) 0.0 - 2.0 mmol/L   Sodium 130 (L) 135 - 145 mmol/L   Potassium 4.8 3.5 - 5.1 mmol/L   Calcium, Ion 1.10 (L) 1.15 - 1.40 mmol/L   HCT 28.0 (L) 36.0 - 46.0 %   Hemoglobin 9.5 (L) 12.0 - 15.0 g/dL   Patient temperature 98.7 F    Collection site RADIAL, ALLEN'S TEST ACCEPTABLE    Drawn by RT    Sample type ARTERIAL    Dg Chest 2 View  Result Date: 08/22/2018 CLINICAL DATA:  Weakness, altered level of consciousness EXAM: CHEST - 2 VIEW COMPARISON:  08/02/2018 FINDINGS: Lungs are clear.  No pleural effusion or pneumothorax. Cardiomegaly. Right IJ dual lumen dialysis catheter terminates in the upper right atrium. Mild degenerative changes of the visualized thoracolumbar spine. IMPRESSION: Normal chest radiographs. Electronically Signed   By: Julian Hy M.D.   On: 08/22/2018 19:52   Ct Head Wo Contrast  Result Date: 08/22/2018 CLINICAL DATA:  Altered level of consciousness. EXAM: CT HEAD WITHOUT CONTRAST TECHNIQUE: Contiguous axial images were obtained from the base of the skull through the vertex without intravenous contrast. COMPARISON:  None. FINDINGS: Brain: Bilateral extra-axial fluid collections compatible with subdural hematomas that appears subacute to chronic. Majority of the fluid is low-density. There are small areas of higher density. Multiple septations are seen within the fluid. Right-sided collection measures approximately 14 mm and left sided collection measures 15 mm. Minimal midline shift to the right. Negative for hydrocephalus. No acute infarct. Chronic ischemic change in the white matter. Vascular: Negative for hyperdense vessel Skull: Negative Sinuses/Orbits: Mucosal edema paranasal sinuses most notably right frontal ethmoid and maxillary sinuses. Bony thickening of the maxillary sinus bilaterally Other: None IMPRESSION: 15 mm bilateral  subdural hematomas which appears subacute to chronic. Majority of the fluid is low-density without evidence of acute bleeding. Minimal midline shift to the right. These results were called by telephone at the time of interpretation on 08/22/2018 at 9:18 pm to Dr. Isla Pence , who verbally acknowledged these results. Electronically Signed   By: Franchot Gallo M.D.   On: 08/22/2018 21:19    Pending Labs Unresulted Labs (From admission, onward)    Start     Ordered   08/23/18 0500  Comprehensive metabolic panel  Tomorrow morning,   R    Question:  Specimen collection method  Answer:  IV Team=IV Team collect   08/22/18 2230   08/23/18 0500  CBC  Tomorrow morning,   R    Question:  Specimen collection method  Answer:  IV Team=IV Team collect   08/22/18 2230   08/22/18 2122  SARS Coronavirus 2 (CEPHEID - Performed in [place] hospital lab), Hosp Order  (Asymptomatic Patients Labs)  Once,   STAT    Question:  Rule Out  Answer:  Yes   08/22/18 2121   08/22/18 1849  Urinalysis, Routine w reflex microscopic  (ED ALOC)  Once,   STAT     08/22/18 1849          Vitals/Pain Today's Vitals   08/22/18 1846 08/22/18 1900 08/22/18 1930 08/22/18 2030  BP: (!) 160/92 (!) 147/85 (!) 157/93 (!) 176/96  Pulse: 91 90    Resp: (!) 21  (!) 26 (!)  23  Temp: 99.2 F (37.3 C)     TempSrc: Oral     SpO2: 99% 99%      Isolation Precautions No active isolations  Medications Medications  sodium chloride flush (NS) 0.9 % injection 3 mL (has no administration in time range)  sodium chloride flush (NS) 0.9 % injection 3 mL (has no administration in time range)  0.9 %  sodium chloride infusion (has no administration in time range)  acetaminophen (TYLENOL) tablet 650 mg (has no administration in time range)    Or  acetaminophen (TYLENOL) suppository 650 mg (has no administration in time range)  insulin aspart (novoLOG) injection 0-9 Units (has no administration in time range)    Mobility  Low fall  risk   Focused Assessments   R Recommendations: See Admitting Provider Note  Report given to:   Additional Notes:

## 2018-08-22 NOTE — ED Notes (Signed)
Patient transported to X-ray 

## 2018-08-22 NOTE — ED Triage Notes (Signed)
EMS reports lethargy and tremors since this AM, right hand tremors. Never before witnessed by pt. HXt of Dialysis, copd, chf. Dialysis scheduled for tomorrow. EMS vitals: 98hr, 99%o2, 157/80, 140cbg, 98.2*f, 20RR. Denies N/V/D.

## 2018-08-22 NOTE — ED Provider Notes (Signed)
[facility] EMERGENCY DEPARTMENT Provider Note   CSN: [id number] Arrival date & time: 08/22/18  1837    History   Chief Complaint Chief Complaint  Patient presents with   Weakness    HPI Joan Mosley is a 73 y.o. female.     Pt presents to the ED today with lethargy and tremors since this morning.  Pt is a dialysis patient (Tu, Th, Sat).  She did go to dialysis on Saturday and has been compliant.  The pt denies any new sob, but she has a hx of COPD and is oxygen dependent.  She had a negative covid test on 7/6.  The pt did have her klonopin and prozac changed on 7/13 by pcp.  No f/c.        Past Medical History:  Diagnosis Date   Arthritis    COPD (chronic obstructive pulmonary disease) ([place])    Diabetes mellitus without complication ([place])    Diabetic neuropathy ([place])    Oxygen dependent 03/30/2018   PT uses O2  all the time   Renal disorder    ESRD   Thyroid disease    TIA (transient ischemic attack)     Patient Active Problem List   Diagnosis Date Noted   Subdural hematoma ([place]) 08/22/2018   Elevated troponin 08/22/2018   ESRD (end stage renal disease) ([place]) 08/22/2018   Anxiety 07/04/2018   Counseling regarding advanced directives and goals of care 07/01/2018   Full code status 07/01/2018   Drug-induced constipation 07/01/2018   Hemodialysis-associated hypotension 07/01/2018   Dialysis patient ([place]) 07/01/2018   HOH (hard of hearing) 07/01/2018   Stage 5 chronic kidney disease on chronic dialysis ([place]) 07/01/2018   Type 2 diabetes mellitus with diabetic polyneuropathy, with long-term current use of insulin ([place]) 07/01/2018   Hypothyroidism 07/01/2018   Gait instability 07/01/2018   Hyperkalemia 04/20/2018    Past Surgical History:  Procedure Laterality Date   ABDOMINAL HYSTERECTOMY     RECTOCELE REPAIR     REPLACEMENT TOTAL KNEE BILATERAL Bilateral 2008   THYROIDECTOMY     80%   VAGINAL WOUND  CLOSURE / REPAIR       OB History   No obstetric history on file.      Home Medications    Prior to Admission medications   Medication Sig Start Date End Date Taking? Authorizing Provider  acetaminophen (TYLENOL) 500 MG tablet Take 1,000 mg by mouth every 6 (six) hours as needed for mild pain.    [provider]  albuterol (VENTOLIN HFA) 108 (90 Base) MCG/ACT inhaler Inhale 2 puffs into the lungs every 6 (six) hours as needed for wheezing or shortness of breath.    [provider]  aspirin (BAYER LOW DOSE) 81 MG EC tablet Take 81 mg by mouth daily. Swallow whole.    [provider]  calcitRIOL (ROCALTROL) 0.25 MCG capsule Take 0.25 mcg by mouth daily.    [provider]  clonazePAM (KLONOPIN) 1 MG tablet Take 1 tablet (1 mg total) by mouth 2 (two) times daily as needed for anxiety. Change in dose 08/08/18   Nche, Charlene Brooke, NP  clopidogrel (PLAVIX) 75 MG tablet Take 1 tablet (75 mg total) by mouth daily. 07/04/18   Nche, Charlene Brooke, NP  FLUoxetine (PROZAC) 10 MG tablet Take 1 tablet (10 mg total) by mouth daily. 08/08/18   Nche, Charlene Brooke, NP  furosemide (LASIX) 80 MG tablet Take 80 mg by mouth See admin instructions. Sunday, Monday,Wed,Friday--in AM  [provider]  insulin detemir (LEVEMIR) 100 UNIT/ML injection Inject 0.15 mLs (15 Units total) into the skin daily. 07/04/18   Nche, Charlene Brooke, NP  insulin lispro (HUMALOG) 100 UNIT/ML injection Inject 1-5 Units into the skin 3 (three) times daily with meals as needed for high blood sugar. Sliding scale     [provider]  ipratropium (ATROVENT) 0.02 % nebulizer solution Inhale 0.5 mg into the lungs 4 (four) times daily.     [provider]  levothyroxine (SYNTHROID) 112 MCG tablet Take 1 tablet (112 mcg total) by mouth daily before breakfast. 1 AM 07/04/18   Nche, Charlene Brooke, NP  lidocaine (LIDODERM) 5 % Place 1 patch onto the skin every 12 (twelve) hours as needed  (back pain). Remove & Discard patch within 12 hours or as directed by MD     [provider]  midodrine (PROAMATINE) 10 MG tablet Take 10 mg by mouth 3 (three) times daily. Taking on Dialysis days, Tues, Thur, Sat.    [provider]  montelukast (SINGULAIR) 10 MG tablet Take 10 mg by mouth at bedtime. PM    [provider]  nystatin (MYCOSTATIN/NYSTOP) powder Apply 1 g topically 2 (two) times daily as needed (skin rash).     [provider]  nystatin cream (MYCOSTATIN) Apply 1 application topically 2 (two) times daily as needed for dry skin.     [provider]  ondansetron (ZOFRAN) 4 MG tablet Take 1 tablet (4 mg total) by mouth every 6 (six) hours. Patient not taking: Reported on 08/08/2018 08/02/18   Valarie Merino, MD  rosuvastatin (CRESTOR) 40 MG tablet Take 40 mg by mouth daily. AM    [provider]  senna-docusate (SENNA PLUS) 8.6-50 MG tablet Take 1 tablet by mouth at bedtime. 07/04/18   Nche, Charlene Brooke, NP  sevelamer (RENAGEL) 800 MG tablet Take 1,600 mg by mouth 3 (three) times daily with meals. 1600 mg --3 times daily--take additional 1-2 if eats snack    [provider]    Family History Family History  Problem Relation Age of Onset   Heart disease Mother    Hypertension Mother    Heart disease Father    Hypertension Father     Social History Social History   Tobacco Use   Smoking status: Former Smoker    Quit date: 03/29/1981    Years since quitting: 37.4   Smokeless tobacco: Never Used  Substance Use Topics   Alcohol use: Never    Frequency: Never   Drug use: Never     Allergies   Avelox [moxifloxacin hcl in nacl], Codeine, Other, Penicillins, Sulfa antibiotics, and Shellfish allergy   Review of Systems Review of Systems  Neurological: Positive for tremors.  All other systems reviewed and are negative.    Physical Exam Updated Vital Signs BP 136/66 (BP Location: Left Arm)    Pulse 84     Temp 98.8 F (37.1 C) (Oral)    Resp (!) 28    Wt 110 kg    SpO2 90%    BMI 45.82 kg/m   Physical Exam Vitals signs and nursing note reviewed.  Constitutional:      Appearance: Normal appearance. She is obese.  HENT:     Head: Normocephalic and atraumatic.     Right Ear: External ear normal.     Left Ear: External ear normal.     Nose: Nose normal.     Mouth/Throat:     Mouth:  Mucous membranes are moist.     Pharynx: Oropharynx is clear.  Eyes:     Extraocular Movements: Extraocular movements intact.     Conjunctiva/sclera: Conjunctivae normal.     Pupils: Pupils are equal, round, and reactive to light.  Neck:     Musculoskeletal: Normal range of motion and neck supple.  Cardiovascular:     Rate and Rhythm: Normal rate and regular rhythm.     Pulses: Normal pulses.     Heart sounds: Normal heart sounds.  Pulmonary:     Effort: Pulmonary effort is normal.     Breath sounds: Normal breath sounds.  Chest:     Comments: Dialysis catheter right upper chest Abdominal:     General: Abdomen is flat. Bowel sounds are normal.     Palpations: Abdomen is soft.  Musculoskeletal: Normal range of motion.  Skin:    General: Skin is warm.     Capillary Refill: Capillary refill takes less than 2 seconds.  Neurological:     General: No focal deficit present.     Mental Status: She is alert and oriented to person, place, and time.  Psychiatric:        Mood and Affect: Mood normal.        Behavior: Behavior normal.      ED Treatments / Results  Labs (all labs ordered are listed, but only abnormal results are displayed) Labs Reviewed  CBC WITH DIFFERENTIAL/PLATELET - Abnormal; Notable for the following components:      Result Value   RBC 3.37 (*)    Hemoglobin 10.1 (*)    HCT 32.5 (*)    RDW 15.7 (*)    All other components within normal limits  COMPREHENSIVE METABOLIC PANEL - Abnormal; Notable for the following components:   Sodium 133 (*)    Chloride 90 (*)    Glucose,  Bld 107 (*)    BUN 46 (*)    Creatinine, Ser 9.29 (*)    Albumin 3.3 (*)    AST 10 (*)    GFR calc non Af Amer 4 (*)    GFR calc Af Amer 4 (*)    Anion gap 16 (*)    All other components within normal limits  MAGNESIUM - Abnormal; Notable for the following components:   Magnesium 2.7 (*)    All other components within normal limits  COMPREHENSIVE METABOLIC PANEL - Abnormal; Notable for the following components:   Sodium 133 (*)    Chloride 90 (*)    BUN 48 (*)    Creatinine, Ser 9.72 (*)    Calcium 8.7 (*)    Total Protein 6.3 (*)    Albumin 3.0 (*)    AST 8 (*)    GFR calc non Af Amer 4 (*)    GFR calc Af Amer 4 (*)    Anion gap 16 (*)    All other components within normal limits  CBC - Abnormal; Notable for the following components:   RBC 3.31 (*)    Hemoglobin 9.9 (*)    HCT 31.6 (*)    RDW 15.7 (*)    All other components within normal limits  POCT I-STAT 7, (LYTES, BLD GAS, ICA,H+H) - Abnormal; Notable for the following components:   pO2, Arterial 122.0 (*)    Bicarbonate 28.1 (*)    Acid-Base Excess 4.0 (*)    Sodium 130 (*)    Calcium, Ion 1.10 (*)    HCT 28.0 (*)    Hemoglobin 9.5 (*)  All other components within normal limits  TROPONIN I (HIGH SENSITIVITY) - Abnormal; Notable for the following components:   Troponin I (High Sensitivity) 20 (*)    All other components within normal limits  TROPONIN I (HIGH SENSITIVITY) - Abnormal; Notable for the following components:   Troponin I (High Sensitivity) 19 (*)    All other components within normal limits  SARS CORONAVIRUS 2 (HOSPITAL ORDER, [facility])  NOVEL CORONAVIRUS, NAA (HOSPITAL ORDER, SEND-OUT TO REF LAB)  PROTIME-INR  GLUCOSE, CAPILLARY  GLUCOSE, CAPILLARY  GLUCOSE, CAPILLARY  URINALYSIS, ROUTINE W REFLEX MICROSCOPIC  CBG MONITORING, ED  I-STAT ARTERIAL BLOOD GAS, ED    EKG EKG Interpretation  Date/Time:  Monday August 22 2018 18:51:23 EDT Ventricular Rate:  88 PR  Interval:    QRS Duration: 117 QT Interval:  392 QTC Calculation: 475 R Axis:   138 Text Interpretation:  Sinus rhythm Nonspecific intraventricular conduction delay Lateral infarct, age indeterminate Abnrm T, probable ischemia, anterolateral lds new t wave inversions Confirmed by Isla Pence [phone]) on 08/22/2018 6:57:33 PM   Radiology Dg Chest 2 View  Result Date: 08/22/2018 CLINICAL DATA:  Weakness, altered level of consciousness EXAM: CHEST - 2 VIEW COMPARISON:  08/02/2018 FINDINGS: Lungs are clear.  No pleural effusion or pneumothorax. Cardiomegaly. Right IJ dual lumen dialysis catheter terminates in the upper right atrium. Mild degenerative changes of the visualized thoracolumbar spine. IMPRESSION: Normal chest radiographs. Electronically Signed   By: Julian Hy M.D.   On: 08/22/2018 19:52   Ct Head Wo Contrast  Result Date: 08/22/2018 CLINICAL DATA:  Altered level of consciousness. EXAM: CT HEAD WITHOUT CONTRAST TECHNIQUE: Contiguous axial images were obtained from the base of the skull through the vertex without intravenous contrast. COMPARISON:  None. FINDINGS: Brain: Bilateral extra-axial fluid collections compatible with subdural hematomas that appears subacute to chronic. Majority of the fluid is low-density. There are small areas of higher density. Multiple septations are seen within the fluid. Right-sided collection measures approximately 14 mm and left sided collection measures 15 mm. Minimal midline shift to the right. Negative for hydrocephalus. No acute infarct. Chronic ischemic change in the white matter. Vascular: Negative for hyperdense vessel Skull: Negative Sinuses/Orbits: Mucosal edema paranasal sinuses most notably right frontal ethmoid and maxillary sinuses. Bony thickening of the maxillary sinus bilaterally Other: None IMPRESSION: 15 mm bilateral subdural hematomas which appears subacute to chronic. Majority of the fluid is low-density without evidence of acute  bleeding. Minimal midline shift to the right. These results were called by telephone at the time of interpretation on 08/22/2018 at 9:18 pm to Dr. Isla Pence , who verbally acknowledged these results. Electronically Signed   By: Franchot Gallo M.D.   On: 08/22/2018 21:19    Procedures Procedures (including critical care time)  Medications Ordered in ED Medications  sodium chloride flush (NS) 0.9 % injection 3 mL (3 mLs Intravenous Given 08/23/18 1158)  sodium chloride flush (NS) 0.9 % injection 3 mL (has no administration in time range)  0.9 %  sodium chloride infusion (has no administration in time range)  acetaminophen (TYLENOL) tablet 650 mg (has no administration in time range)    Or  acetaminophen (TYLENOL) suppository 650 mg (has no administration in time range)  furosemide (LASIX) tablet 80 mg (has no administration in time range)  midodrine (PROAMATINE) tablet 10 mg (10 mg Oral Given 08/23/18 1201)  rosuvastatin (CRESTOR) tablet 40 mg (40 mg Oral Given 08/23/18 1158)  levothyroxine (SYNTHROID) tablet 112 mcg (112  mcg Oral Given 08/23/18 0615)  senna-docusate (Senokot-S) tablet 1 tablet (has no administration in time range)  sevelamer carbonate (RENVELA) tablet 1,600 mg (1,600 mg Oral Not Given 08/23/18 1159)  clonazePAM (KLONOPIN) tablet 1 mg (has no administration in time range)  montelukast (SINGULAIR) tablet 10 mg (has no administration in time range)  insulin aspart (novoLOG) injection 0-9 Units (0 Units Subcutaneous Not Given 08/23/18 1610)  hydrALAZINE (APRESOLINE) injection 10 mg (has no administration in time range)  Chlorhexidine Gluconate Cloth 2 % PADS 6 each (6 each Topical Given 08/23/18 1157)  ipratropium-albuterol (DUONEB) 0.5-2.5 (3) MG/3ML nebulizer solution 3 mL (has no administration in time range)  calcitRIOL (ROCALTROL) capsule 1.5 mcg (has no administration in time range)  perflutren lipid microspheres (DEFINITY) IV suspension (2 mLs Intravenous Given 08/23/18  1417)     Initial Impression / Assessment and Plan / ED Course  I have reviewed the triage vital signs and the nursing notes.  Pertinent labs & imaging results that were available during my care of the patient were reviewed by me and considered in my medical decision making (see chart for details).    Results of CT head d/w Dr. Arnoldo Morale (NS).  He said he will see her in the morning.  Hold plavix.    covid negative.  Pt d/w Dr. Maudie Mercury ([facility]) for admission.  Maliya Marich was evaluated in Emergency Department on 08/23/2018 for the symptoms described in the history of present illness. She was evaluated in the context of the global COVID-19 pandemic, which necessitated consideration that the patient might be at risk for infection with the SARS-CoV-2 virus that causes COVID-19. Institutional protocols and algorithms that pertain to the evaluation of patients at risk for COVID-19 are in a state of rapid change based on information released by regulatory bodies including the CDC and federal and state organizations. These policies and algorithms were followed during the patient's care in the ED.  Final Clinical Impressions(s) / ED Diagnoses   Final diagnoses:  Subdural hematoma ([place])  ESRD on hemodialysis [facility])    ED Discharge Orders    None       Isla Pence, MD 08/23/18 [phone]

## 2018-08-22 NOTE — H&P (Addendum)
TRH H&P    Patient Demographics:    Joan Mosley, is a 73 y.o. female  MRN: [id number]  DOB - [birth year]  Admit Date - 08/22/2018  Referring MD/NP/PA: Isla Pence  Outpatient Primary MD for the patient is Nche, Charlene Brooke, NP  Patient coming from:  home  Chief complaint-  Lethargy and tremors   HPI:    Joan Mosley  is a 73 y.o. female, w Dm2 with ESRD on HD (M, W, F), Copd apparently c/o lethargy and tremors.   In Ed,   CT brain IMPRESSION: 15 mm bilateral subdural hematomas which appears subacute to chronic. Majority of the fluid is low-density without evidence of acute bleeding. Minimal midline shift to the right.  CXR IMPRESSION: Normal chest radiographs.  Na 133, K 5.0, Bun 46, Creatinine 9.29 Alb 3.3  Ast 10, Alt 10, Alk phos 72, T. Bili 0.9  Wbc 9.8, Hgb 10.1, Plt 188 INR 1.0 Trop 20  Magnesium 2.7  ED spoke with Dr. Arnoldo Morale who requested medicine admission and holding plavix and will be by in am to assess  Pt will be admitted for AMS secondary to sudural hematoma   Review of systems:    In addition to the HPI above, unable to provide clearly due to AMS No Fever-chills, No Headache, No changes with Vision or hearing, No problems swallowing food or Liquids, No Chest pain, Cough or Shortness of Breath, No Abdominal pain, No Nausea or Vomiting, bowel movements are regular, No Blood in stool or Urine, No dysuria, No new skin rashes or bruises, No new joints pains-aches,  No new weakness, tingling, numbness in any extremity, No recent weight gain or loss, No polyuria, polydypsia or polyphagia, No significant Mental Stressors.  All other systems reviewed and are negative.    Past History of the following :    Past Medical History:  Diagnosis Date  . Arthritis   . COPD (chronic obstructive pulmonary disease) ([place])   . Diabetes mellitus without  complication ([place])   . Oxygen dependent 03/30/2018   PT uses O2  all the time  . Renal disorder    ESRD  . Thyroid disease       Past Surgical History:  Procedure Laterality Date  . ABDOMINAL HYSTERECTOMY    . RECTOCELE REPAIR    . REPLACEMENT TOTAL KNEE BILATERAL Bilateral 2008  . THYROIDECTOMY     80%  . VAGINAL WOUND CLOSURE / REPAIR        Social History:      Social History   Tobacco Use  . Smoking status: Former Smoker    Quit date: 03/29/1981    Years since quitting: 37.4  . Smokeless tobacco: Never Used  Substance Use Topics  . Alcohol use: Never    Frequency: Never       Family History :     Family History  Problem Relation Age of Onset  . Heart disease Mother   . Hypertension Mother   . Heart disease Father   . Hypertension Father  Home Medications:   Prior to Admission medications   Medication Sig Start Date End Date Taking? Authorizing Provider  acetaminophen (TYLENOL) 500 MG tablet Take 1,000 mg by mouth every 6 (six) hours as needed for mild pain.    [provider]  albuterol (VENTOLIN HFA) 108 (90 Base) MCG/ACT inhaler Inhale 2 puffs into the lungs every 6 (six) hours as needed for wheezing or shortness of breath.    [provider]  aspirin (BAYER LOW DOSE) 81 MG EC tablet Take 81 mg by mouth daily. Swallow whole.    [provider]  calcitRIOL (ROCALTROL) 0.25 MCG capsule Take 0.25 mcg by mouth daily.    [provider]  clonazePAM (KLONOPIN) 1 MG tablet Take 1 tablet (1 mg total) by mouth 2 (two) times daily as needed for anxiety. Change in dose 08/08/18   Nche, Charlene Brooke, NP  clopidogrel (PLAVIX) 75 MG tablet Take 1 tablet (75 mg total) by mouth daily. 07/04/18   Nche, Charlene Brooke, NP  FLUoxetine (PROZAC) 10 MG tablet Take 1 tablet (10 mg total) by mouth daily. 08/08/18   Nche, Charlene Brooke, NP  furosemide (LASIX) 80 MG tablet Take 80 mg by mouth See admin instructions. Sunday,  Monday,Wed,Friday--in AM     [provider]  insulin detemir (LEVEMIR) 100 UNIT/ML injection Inject 0.15 mLs (15 Units total) into the skin daily. 07/04/18   Nche, Charlene Brooke, NP  insulin lispro (HUMALOG) 100 UNIT/ML injection Inject 1-5 Units into the skin 3 (three) times daily with meals as needed for high blood sugar. Sliding scale     [provider]  ipratropium (ATROVENT) 0.02 % nebulizer solution Inhale 0.5 mg into the lungs 4 (four) times daily.     [provider]  levothyroxine (SYNTHROID) 112 MCG tablet Take 1 tablet (112 mcg total) by mouth daily before breakfast. 1 AM 07/04/18   Nche, Charlene Brooke, NP  lidocaine (LIDODERM) 5 % Place 1 patch onto the skin every 12 (twelve) hours as needed (back pain). Remove & Discard patch within 12 hours or as directed by MD     [provider]  midodrine (PROAMATINE) 10 MG tablet Take 10 mg by mouth 3 (three) times daily. Taking on Dialysis days, Tues, Thur, Sat.    [provider]  montelukast (SINGULAIR) 10 MG tablet Take 10 mg by mouth at bedtime. PM    [provider]  nystatin (MYCOSTATIN/NYSTOP) powder Apply 1 g topically 2 (two) times daily as needed (skin rash).     [provider]  nystatin cream (MYCOSTATIN) Apply 1 application topically 2 (two) times daily as needed for dry skin.     [provider]  ondansetron (ZOFRAN) 4 MG tablet Take 1 tablet (4 mg total) by mouth every 6 (six) hours. Patient not taking: Reported on 08/08/2018 08/02/18   Valarie Merino, MD  rosuvastatin (CRESTOR) 40 MG tablet Take 40 mg by mouth daily. AM    [provider]  senna-docusate (SENNA PLUS) 8.6-50 MG tablet Take 1 tablet by mouth at bedtime. 07/04/18   Nche, Charlene Brooke, NP  sevelamer (RENAGEL) 800 MG tablet Take 1,600 mg by mouth 3 (three) times daily with meals. 1600 mg --3 times daily--take additional 1-2 if eats snack    [provider]     Allergies:      Allergies  Allergen Reactions  . Avelox [Moxifloxacin Hcl In Nacl]     PT does not remember  . Codeine Anaphylaxis  PT does not remember  . Other     PT does not remember  . Penicillins     Rash  . Sulfa Antibiotics     PT does nolt remember  . Shellfish Allergy Hives, Itching and Rash     Physical Exam:   Vitals  Blood pressure (!) 176/96, pulse 90, temperature 99.2 F (37.3 C), temperature source Oral, resp. rate (!) 23, SpO2 99 %.  1.  General: axoxo3  2. Psychiatric: euthymic  3. Neurologic: Slight resting tremor bilateral hands, cn2-12 intact, reflexes 2+ symmetric, diffuse with no clonus, motor 5/5 in all 4 ext  4. HEENMT:  Anicteric, pupils 1.67m symmetric, direct, consensual, near intact Neck: no jvd, no bruit, no tm  5. Respiratory : CTAB  6. Cardiovascular : rrr s1, s2,   7. Gastrointestinal:  Abd: soft, nt, nd, +bs  8. Skin:  Ext: no c/c/e,  No rash Feet dry and scaly Onychomycosis  9.Musculoskeletal:  Good ROM,  No adenopathy     Data Review:    CBC Recent Labs  Lab 08/22/18 1924 08/22/18 2015  WBC 9.8  --   HGB 10.1* 9.5*  HCT 32.5* 28.0*  PLT 188  --   MCV 96.4  --   MCH 30.0  --   MCHC 31.1  --   RDW 15.7*  --   LYMPHSABS 1.8  --   MONOABS 0.9  --   EOSABS 0.3  --   BASOSABS 0.0  --    ------------------------------------------------------------------------------------------------------------------  Results for orders placed or performed during the hospital encounter of 08/22/18 (from the past 48 hour(s))  CBG monitoring, ED     Status: None   Collection Time: 08/22/18  7:20 PM  Result Value Ref Range   Glucose-Capillary 82 70 - 99 mg/dL  CBC with Differential     Status: Abnormal   Collection Time: 08/22/18  7:24 PM  Result Value Ref Range   WBC 9.8 4.0 - 10.5 K/uL   RBC 3.37 (L) 3.87 - 5.11 MIL/uL   Hemoglobin 10.1 (L) 12.0 - 15.0 g/dL   HCT 32.5 (L) 36.0 - 46.0 %   MCV 96.4 80.0 - 100.0 fL   MCH 30.0 26.0 -  34.0 pg   MCHC 31.1 30.0 - 36.0 g/dL   RDW 15.7 (H) 11.5 - 15.5 %   Platelets 188 150 - 400 K/uL   nRBC 0.0 0.0 - 0.2 %   Neutrophils Relative % 69 %   Neutro Abs 6.7 1.7 - 7.7 K/uL   Lymphocytes Relative 19 %   Lymphs Abs 1.8 0.7 - 4.0 K/uL   Monocytes Relative 9 %   Monocytes Absolute 0.9 0.1 - 1.0 K/uL   Eosinophils Relative 3 %   Eosinophils Absolute 0.3 0.0 - 0.5 K/uL   Basophils Relative 0 %   Basophils Absolute 0.0 0.0 - 0.1 K/uL   Immature Granulocytes 0 %   Abs Immature Granulocytes 0.02 0.00 - 0.07 K/uL    Comment: Performed at [facility] [address] [place] [id number] Comprehensive metabolic panel     Status: Abnormal   Collection Time: 08/22/18  7:24 PM  Result Value Ref Range   Sodium 133 (L) 135 - 145 mmol/L   Potassium 5.0 3.5 - 5.1 mmol/L   Chloride 90 (L) 98 - 111 mmol/L   CO2 27 22 - 32 mmol/L   Glucose, Bld 107 (H) 70 - 99 mg/dL   BUN 46 (H) 8 - 23  mg/dL   Creatinine, Ser 9.29 (H) 0.44 - 1.00 mg/dL   Calcium 8.9 8.9 - 10.3 mg/dL   Total Protein 6.7 6.5 - 8.1 g/dL   Albumin 3.3 (L) 3.5 - 5.0 g/dL   AST 10 (L) 15 - 41 U/L   ALT 10 0 - 44 U/L   Alkaline Phosphatase 72 38 - 126 U/L   Total Bilirubin 0.9 0.3 - 1.2 mg/dL   GFR calc non Af Amer 4 (L) >60 mL/min   GFR calc Af Amer 4 (L) >60 mL/min   Anion gap 16 (H) 5 - 15    Comment: Performed at [facility] [address], [place], [place] [address]  Protime-INR     Status: None   Collection Time: 08/22/18  7:24 PM  Result Value Ref Range   Prothrombin Time 13.2 11.4 - 15.2 seconds   INR 1.0 0.8 - 1.2    Comment: (NOTE) INR goal varies based on device and disease states. Performed at [facility], [place] [address] [place], [place] [address]   Troponin I (High Sensitivity)     Status: Abnormal   Collection Time: 08/22/18  7:24 PM  Result Value Ref Range   Troponin I (High Sensitivity) 20 (H) <18 ng/L    Comment: (NOTE) Elevated high sensitivity troponin I (hsTnI)  values and significant  changes across serial measurements may suggest ACS but many other  chronic and acute conditions are known to elevate hsTnI results.  Refer to the "Links" section for chest pain algorithms and additional  guidance. Performed at [facility], [place] [address], [place], [place] [address]   Magnesium     Status: Abnormal   Collection Time: 08/22/18  7:24 PM  Result Value Ref Range   Magnesium 2.7 (H) 1.7 - 2.4 mg/dL    Comment: Performed at [facility] [address] [place], [place] [address]  I-STAT 7, (LYTES, BLD GAS, ICA, H+H)     Status: Abnormal   Collection Time: 08/22/18  8:15 PM  Result Value Ref Range   pH, Arterial 7.439 7.350 - 7.450   pCO2 arterial 41.5 32.0 - 48.0 mmHg   pO2, Arterial 122.0 (H) 83.0 - 108.0 mmHg   Bicarbonate 28.1 (H) 20.0 - 28.0 mmol/L   TCO2 29 22 - 32 mmol/L   O2 Saturation 99.0 %   Acid-Base Excess 4.0 (H) 0.0 - 2.0 mmol/L   Sodium 130 (L) 135 - 145 mmol/L   Potassium 4.8 3.5 - 5.1 mmol/L   Calcium, Ion 1.10 (L) 1.15 - 1.40 mmol/L   HCT 28.0 (L) 36.0 - 46.0 %   Hemoglobin 9.5 (L) 12.0 - 15.0 g/dL   Patient temperature 98.7 F    Collection site RADIAL, ALLEN'S TEST ACCEPTABLE    Drawn by RT    Sample type ARTERIAL     Chemistries  Recent Labs  Lab 08/22/18 1924 08/22/18 2015  NA 133* 130*  K 5.0 4.8  CL 90*  --   CO2 27  --   GLUCOSE 107*  --   BUN 46*  --   CREATININE 9.29*  --   CALCIUM 8.9  --   MG 2.7*  --   AST 10*  --   ALT 10  --   ALKPHOS 72  --   BILITOT 0.9  --    ------------------------------------------------------------------------------------------------------------------  ------------------------------------------------------------------------------------------------------------------ GFR: CrCl cannot be calculated (Unknown ideal weight.). Liver Function Tests: Recent Labs  Lab 08/22/18 1924  AST 10*  ALT 10  ALKPHOS 72  BILITOT 0.9  PROT 6.7  ALBUMIN 3.3*   No  results for input(s): LIPASE, AMYLASE in the last 168 hours. No results for input(s): AMMONIA in the last 168 hours. Coagulation Profile: Recent Labs  Lab 08/22/18 1924  INR 1.0   Cardiac Enzymes: No results for input(s): CKTOTAL, CKMB, CKMBINDEX, TROPONINI in the last 168 hours. BNP (last 3 results) No results for input(s): PROBNP in the last 8760 hours. HbA1C: No results for input(s): HGBA1C in the last 72 hours. CBG: Recent Labs  Lab 08/22/18 1920  GLUCAP 82   Lipid Profile: No results for input(s): CHOL, HDL, LDLCALC, TRIG, CHOLHDL, LDLDIRECT in the last 72 hours. Thyroid Function Tests: No results for input(s): TSH, T4TOTAL, FREET4, T3FREE, THYROIDAB in the last 72 hours. Anemia Panel: No results for input(s): VITAMINB12, FOLATE, FERRITIN, TIBC, IRON, RETICCTPCT in the last 72 hours.  --------------------------------------------------------------------------------------------------------------- Urine analysis:    Component Value Date/Time   COLORURINE YELLOW 04/20/2018 0301   APPEARANCEUR HAZY (A) 04/20/2018 0301   LABSPEC 1.009 04/20/2018 0301   PHURINE 9.0 (H) 04/20/2018 0301   GLUCOSEU 150 (A) 04/20/2018 0301   HGBUR MODERATE (A) 04/20/2018 0301   BILIRUBINUR NEGATIVE 04/20/2018 0301   KETONESUR NEGATIVE 04/20/2018 0301   PROTEINUR 100 (A) 04/20/2018 0301   NITRITE NEGATIVE 04/20/2018 0301   LEUKOCYTESUR NEGATIVE 04/20/2018 0301      Imaging Results:    Dg Chest 2 View  Result Date: 08/22/2018 CLINICAL DATA:  Weakness, altered level of consciousness EXAM: CHEST - 2 VIEW COMPARISON:  08/02/2018 FINDINGS: Lungs are clear.  No pleural effusion or pneumothorax. Cardiomegaly. Right IJ dual lumen dialysis catheter terminates in the upper right atrium. Mild degenerative changes of the visualized thoracolumbar spine. IMPRESSION: Normal chest radiographs. Electronically Signed   By: Julian Hy M.D.   On: 08/22/2018 19:52   Ct Head Wo Contrast  Result Date:  08/22/2018 CLINICAL DATA:  Altered level of consciousness. EXAM: CT HEAD WITHOUT CONTRAST TECHNIQUE: Contiguous axial images were obtained from the base of the skull through the vertex without intravenous contrast. COMPARISON:  None. FINDINGS: Brain: Bilateral extra-axial fluid collections compatible with subdural hematomas that appears subacute to chronic. Majority of the fluid is low-density. There are small areas of higher density. Multiple septations are seen within the fluid. Right-sided collection measures approximately 14 mm and left sided collection measures 15 mm. Minimal midline shift to the right. Negative for hydrocephalus. No acute infarct. Chronic ischemic change in the white matter. Vascular: Negative for hyperdense vessel Skull: Negative Sinuses/Orbits: Mucosal edema paranasal sinuses most notably right frontal ethmoid and maxillary sinuses. Bony thickening of the maxillary sinus bilaterally Other: None IMPRESSION: 15 mm bilateral subdural hematomas which appears subacute to chronic. Majority of the fluid is low-density without evidence of acute bleeding. Minimal midline shift to the right. These results were called by telephone at the time of interpretation on 08/22/2018 at 9:18 pm to Dr. Isla Pence , who verbally acknowledged these results. Electronically Signed   By: Franchot Gallo M.D.   On: 08/22/2018 21:19    ekg nsr at 17, RAD, T inversion in v1-5 (new)    Assessment & Plan:    Principal Problem:   Subdural hematoma (HCC) Active Problems:   Type 2 diabetes mellitus with diabetic polyneuropathy, with long-term current use of insulin (HCC)   Hypothyroidism   Elevated troponin   ESRD (end stage renal disease) (HCC)  Bilateral subdural hematoma Hold Plavix NPO except for  medication Monitor , neurochecks Neurosurgery consulted by ED, appreciate input PT to evaluate and tx  Dysphagia (per son?) Speech therapy to evaluate and tx  Elevated troponin  Trop I q2h x2 Check  cardiac echo  H/o TIA STOP Aspirin STOP Plavix as above Cont Crestor   Dm2 fsbs q4h,  ISS  ESRD on HD T, T, S Cont Calcitriol Please call nephrology in AM to arrange HD  Anxiety Hold Prozac Cont Klonopin 93m po bid prn   Hypothyroidism Cont Levothyroxine    DVT Prophylaxis-  SCDs   AM Labs Ordered, also please review Full Orders  Family Communication: Admission, patients condition and plan of care including tests being ordered have been discussed with the patient  who indicate understanding and agree with the plan and Code Status.  Code Status:  FULL CODE,  [facility] Admission status: Inpatient: Based on patients clinical presentation and evaluation of above clinical data, I have made determination that patient meets Inpatient criteria at this time. Pt has bilateral subdural hematomas,  Son states that his mother is very weak and may benefit from SNF  Time spent in minutes : 760  JJani GravelM.D on 08/22/2018 at 10:25 PM

## 2018-08-23 ENCOUNTER — Inpatient Hospital Stay (HOSPITAL_COMMUNITY): Payer: Medicare Other

## 2018-08-23 DIAGNOSIS — N186 End stage renal disease: Secondary | ICD-10-CM

## 2018-08-23 DIAGNOSIS — E039 Hypothyroidism, unspecified: Secondary | ICD-10-CM

## 2018-08-23 DIAGNOSIS — R7989 Other specified abnormal findings of blood chemistry: Secondary | ICD-10-CM

## 2018-08-23 DIAGNOSIS — S065X9A Traumatic subdural hemorrhage with loss of consciousness of unspecified duration, initial encounter: Secondary | ICD-10-CM

## 2018-08-23 LAB — COMPREHENSIVE METABOLIC PANEL
ALT: 8 U/L (ref 0–44)
AST: 8 U/L — ABNORMAL LOW (ref 15–41)
Albumin: 3 g/dL — ABNORMAL LOW (ref 3.5–5.0)
Alkaline Phosphatase: 67 U/L (ref 38–126)
Anion gap: 16 — ABNORMAL HIGH (ref 5–15)
BUN: 48 mg/dL — ABNORMAL HIGH (ref 8–23)
CO2: 27 mmol/L (ref 22–32)
Calcium: 8.7 mg/dL — ABNORMAL LOW (ref 8.9–10.3)
Chloride: 90 mmol/L — ABNORMAL LOW (ref 98–111)
Creatinine, Ser: 9.72 mg/dL — ABNORMAL HIGH (ref 0.44–1.00)
GFR calc Af Amer: 4 mL/min — ABNORMAL LOW (ref >60)
GFR calc non Af Amer: 4 mL/min — ABNORMAL LOW (ref >60)
Glucose, Bld: 98 mg/dL (ref 70–99)
Potassium: 5 mmol/L (ref 3.5–5.1)
Sodium: 133 mmol/L — ABNORMAL LOW (ref 135–145)
Total Bilirubin: 1 mg/dL (ref 0.3–1.2)
Total Protein: 6.3 g/dL — ABNORMAL LOW (ref 6.5–8.1)

## 2018-08-23 LAB — GLUCOSE, CAPILLARY
Glucose-Capillary: 81 mg/dL (ref 70–99)
Glucose-Capillary: 88 mg/dL (ref 70–99)
Glucose-Capillary: 80 mg/dL (ref 70–99)
Glucose-Capillary: 107 mg/dL — ABNORMAL HIGH (ref 70–99)
Glucose-Capillary: 137 mg/dL — ABNORMAL HIGH (ref 70–99)

## 2018-08-23 LAB — CBC
HCT: 31.6 % — ABNORMAL LOW (ref 36.0–46.0)
Hemoglobin: 9.9 g/dL — ABNORMAL LOW (ref 12.0–15.0)
MCH: 29.9 pg (ref 26.0–34.0)
MCHC: 31.3 g/dL (ref 30.0–36.0)
MCV: 95.5 fL (ref 80.0–100.0)
Platelets: 169 10*3/uL (ref 150–400)
RBC: 3.31 MIL/uL — ABNORMAL LOW (ref 3.87–5.11)
RDW: 15.7 % — ABNORMAL HIGH (ref 11.5–15.5)
WBC: 10.4 10*3/uL (ref 4.0–10.5)
nRBC: 0 % (ref 0.0–0.2)

## 2018-08-23 LAB — SARS CORONAVIRUS 2 BY RT PCR (HOSPITAL ORDER, PERFORMED IN ~~LOC~~ HOSPITAL LAB): SARS Coronavirus 2: NEGATIVE

## 2018-08-23 LAB — ECHOCARDIOGRAM COMPLETE: Weight: 3880.1 oz

## 2018-08-23 LAB — TROPONIN I (HIGH SENSITIVITY): Troponin I (High Sensitivity): 19 ng/L — ABNORMAL HIGH (ref <18)

## 2018-08-23 MED ORDER — FUROSEMIDE 80 MG PO TABS
80.0000 mg | ORAL_TABLET | ORAL | Status: DC
  Administered 2018-08-24: 11:00:00 80 mg via ORAL
  Filled 2018-08-23: qty 1

## 2018-08-23 MED ORDER — MONTELUKAST SODIUM 10 MG PO TABS
10.0000 mg | ORAL_TABLET | Freq: Every day | ORAL | Status: DC
  Administered 2018-08-23 – 2018-08-24 (×2): 10 mg via ORAL
  Filled 2018-08-23 (×2): qty 1

## 2018-08-23 MED ORDER — SENNOSIDES-DOCUSATE SODIUM 8.6-50 MG PO TABS
1.0000 | ORAL_TABLET | Freq: Every day | ORAL | Status: DC
  Administered 2018-08-23 – 2018-08-24 (×2): 1 via ORAL
  Filled 2018-08-23 (×2): qty 1

## 2018-08-23 MED ORDER — MIDODRINE HCL 5 MG PO TABS
10.0000 mg | ORAL_TABLET | ORAL | Status: DC
  Administered 2018-08-23 – 2018-08-25 (×4): 10 mg via ORAL
  Filled 2018-08-23 (×4): qty 2

## 2018-08-23 MED ORDER — PERFLUTREN LIPID MICROSPHERE
1.0000 mL | INTRAVENOUS | Status: AC | PRN
  Administered 2018-08-23: 14:00:00 2 mL via INTRAVENOUS
  Filled 2018-08-23: qty 10

## 2018-08-23 MED ORDER — LEVOTHYROXINE SODIUM 112 MCG PO TABS
112.0000 ug | ORAL_TABLET | Freq: Every day | ORAL | Status: DC
  Administered 2018-08-23 – 2018-08-25 (×3): 112 ug via ORAL
  Filled 2018-08-23 (×3): qty 1

## 2018-08-23 MED ORDER — SEVELAMER CARBONATE 800 MG PO TABS
1600.0000 mg | ORAL_TABLET | Freq: Three times a day (TID) | ORAL | Status: DC
  Administered 2018-08-23 – 2018-08-25 (×7): 1600 mg via ORAL
  Filled 2018-08-23 (×7): qty 2

## 2018-08-23 MED ORDER — CHLORHEXIDINE GLUCONATE CLOTH 2 % EX PADS
6.0000 | MEDICATED_PAD | Freq: Every day | CUTANEOUS | Status: DC
  Administered 2018-08-23 – 2018-08-25 (×2): 6 via TOPICAL

## 2018-08-23 MED ORDER — ALBUTEROL SULFATE (2.5 MG/3ML) 0.083% IN NEBU
2.5000 mg | INHALATION_SOLUTION | Freq: Four times a day (QID) | RESPIRATORY_TRACT | Status: DC | PRN
  Administered 2018-08-23: 07:00:00 2.5 mg via RESPIRATORY_TRACT
  Filled 2018-08-23: qty 3

## 2018-08-23 MED ORDER — IPRATROPIUM-ALBUTEROL 0.5-2.5 (3) MG/3ML IN SOLN: 3.0000 mL | Freq: Four times a day (QID) | RESPIRATORY_TRACT | Status: DC | PRN

## 2018-08-23 MED ORDER — CALCITRIOL 0.25 MCG PO CAPS
0.2500 ug | ORAL_CAPSULE | Freq: Every day | ORAL | Status: DC
  Administered 2018-08-23: 12:00:00 0.25 ug via ORAL
  Filled 2018-08-23: qty 1

## 2018-08-23 MED ORDER — ROSUVASTATIN CALCIUM 20 MG PO TABS
40.0000 mg | ORAL_TABLET | Freq: Every day | ORAL | Status: DC
  Administered 2018-08-23 – 2018-08-25 (×3): 40 mg via ORAL
  Filled 2018-08-23 (×3): qty 2

## 2018-08-23 MED ORDER — CLONAZEPAM 0.5 MG PO TABS
1.0000 mg | ORAL_TABLET | Freq: Two times a day (BID) | ORAL | Status: DC | PRN
  Administered 2018-08-23 – 2018-08-24 (×2): 1 mg via ORAL
  Filled 2018-08-23 (×2): qty 2

## 2018-08-23 MED ORDER — IPRATROPIUM BROMIDE 0.02 % IN SOLN
0.5000 mg | Freq: Four times a day (QID) | RESPIRATORY_TRACT | Status: DC
  Administered 2018-08-23: 07:00:00 0.5 mg via RESPIRATORY_TRACT
  Filled 2018-08-23: qty 2.5

## 2018-08-23 MED ORDER — NYSTATIN 100000 UNIT/GM EX POWD: 1.0000 g | Freq: Two times a day (BID) | CUTANEOUS | Status: DC | PRN

## 2018-08-23 MED ORDER — CALCITRIOL 0.25 MCG PO CAPS
1.5000 ug | ORAL_CAPSULE | ORAL | Status: DC
  Administered 2018-08-24: 11:00:00 1.5 ug via ORAL
  Filled 2018-08-23: qty 6

## 2018-08-23 NOTE — Consult Note (Signed)
[facility] Renal Consultation Note  Requesting MD: Arrien  Indication for Consultation:  ESRD   Chief complaint:   HPI:  Joan Mosley is a 73 y.o. female with a history of ESRD on hemodialysis, COPD with a chronic 5 L oxygen requirement, and diabetes who presented to the hospital with report of lethargy and tremors.  She was found to have bilateral subdural hematomas which were felt to be subacute to chronic.  Neurosurgery was consulted and is currently observing the patient with repeat imaging planned.  Spoke with HD unit re: orders below.  Per charge RN, she signs off early and is over her dry weight.  She came on last scheduled tx 7/25 and left at 110.2 kg. Her pre-weight was 112.9.  (Under 3 kg removed).  Blood pressure will drop with treatment and so is on midodrine 10 mg prior HD.  She takes a second dose mid treatment if systolic if systolic below 95 kg and ends up taking this most if not all treatments.    HD is per TTS schedule at [facility] tunneled catheter EDW 105.5   BF 400/ DF auto 1.5 2K/2 calcium  mircera 75 mcg on 07/07/18 Calcitriol 1.5 mcg each tx and prior TIA who presented to the hospital with confusion and tremor.  She was found on imaging to have bilateral subdural hematomas which were felt to be subacute to chronic. sensipar 14 each treatment   PMHx:   Past Medical History:  Diagnosis Date  . Arthritis   . COPD (chronic obstructive pulmonary disease) ([place])   . Diabetes mellitus without complication ([place])   . Diabetic neuropathy ([place])   . Oxygen dependent 03/30/2018   PT uses O2  all the time  . Renal disorder    ESRD  . Thyroid disease   . TIA (transient ischemic attack)     Past Surgical History:  Procedure Laterality Date  . ABDOMINAL HYSTERECTOMY    . RECTOCELE REPAIR    . REPLACEMENT TOTAL KNEE BILATERAL Bilateral 2008  . THYROIDECTOMY     80%  . VAGINAL WOUND CLOSURE / REPAIR      Family Hx:  Family History  Problem Relation  Age of Onset  . Heart disease Mother   . Hypertension Mother   . Heart disease Father   . Hypertension Father     Social History:  reports that she quit smoking about 37 years ago. She has never used smokeless tobacco. She reports that she does not drink alcohol or use drugs.  Allergies:  Allergies  Allergen Reactions  . Avelox [Moxifloxacin Hcl In Nacl]     PT does not remember  . Codeine Anaphylaxis    PT does not remember  . Other     PT does not remember  . Penicillins     Rash  . Sulfa Antibiotics     PT does nolt remember  . Shellfish Allergy Hives, Itching and Rash    Medications: Prior to Admission medications   Medication Sig Start Date End Date Taking? Authorizing Provider  acetaminophen (TYLENOL) 500 MG tablet Take 1,000 mg by mouth every 6 (six) hours as needed for mild pain.    [provider]  albuterol (VENTOLIN HFA) 108 (90 Base) MCG/ACT inhaler Inhale 2 puffs into the lungs every 6 (six) hours as needed for wheezing or shortness of breath.    [provider]  aspirin (BAYER LOW DOSE) 81 MG EC tablet Take 81 mg by mouth daily. Swallow whole.  [provider]  calcitRIOL (ROCALTROL) 0.25 MCG capsule Take 0.25 mcg by mouth daily.    [provider]  clonazePAM (KLONOPIN) 1 MG tablet Take 1 tablet (1 mg total) by mouth 2 (two) times daily as needed for anxiety. Change in dose 08/08/18   Nche, Charlene Brooke, NP  clopidogrel (PLAVIX) 75 MG tablet Take 1 tablet (75 mg total) by mouth daily. 07/04/18   Nche, Charlene Brooke, NP  FLUoxetine (PROZAC) 10 MG tablet Take 1 tablet (10 mg total) by mouth daily. 08/08/18   Nche, Charlene Brooke, NP  furosemide (LASIX) 80 MG tablet Take 80 mg by mouth See admin instructions. Sunday, Monday,Wed,Friday--in AM     [provider]  insulin detemir (LEVEMIR) 100 UNIT/ML injection Inject 0.15 mLs (15 Units total) into the skin daily. 07/04/18   Nche, Charlene Brooke, NP  insulin lispro (HUMALOG) 100  UNIT/ML injection Inject 1-5 Units into the skin 3 (three) times daily with meals as needed for high blood sugar. Sliding scale     [provider]  ipratropium (ATROVENT) 0.02 % nebulizer solution Inhale 0.5 mg into the lungs 4 (four) times daily.     [provider]  levothyroxine (SYNTHROID) 112 MCG tablet Take 1 tablet (112 mcg total) by mouth daily before breakfast. 1 AM 07/04/18   Nche, Charlene Brooke, NP  lidocaine (LIDODERM) 5 % Place 1 patch onto the skin every 12 (twelve) hours as needed (back pain). Remove & Discard patch within 12 hours or as directed by MD     [provider]  midodrine (PROAMATINE) 10 MG tablet Take 10 mg by mouth 3 (three) times daily. Taking on Dialysis days, Tues, Thur, Sat.    [provider]  montelukast (SINGULAIR) 10 MG tablet Take 10 mg by mouth at bedtime. PM    [provider]  nystatin (MYCOSTATIN/NYSTOP) powder Apply 1 g topically 2 (two) times daily as needed (skin rash).     [provider]  nystatin cream (MYCOSTATIN) Apply 1 application topically 2 (two) times daily as needed for dry skin.     [provider]  ondansetron (ZOFRAN) 4 MG tablet Take 1 tablet (4 mg total) by mouth every 6 (six) hours. Patient not taking: Reported on 08/08/2018 08/02/18   Valarie Merino, MD  rosuvastatin (CRESTOR) 40 MG tablet Take 40 mg by mouth daily. AM    [provider]  senna-docusate (SENNA PLUS) 8.6-50 MG tablet Take 1 tablet by mouth at bedtime. 07/04/18   Nche, Charlene Brooke, NP  sevelamer (RENAGEL) 800 MG tablet Take 1,600 mg by mouth 3 (three) times daily with meals. 1600 mg --3 times daily--take additional 1-2 if eats snack    [provider]    I have reviewed the patient's current medications.  Labs:  BMP Latest Ref Rng & Units 08/23/2018 08/22/2018 08/22/2018  Glucose 70 - 99 mg/dL 98 - 107(H)  BUN 8 - 23 mg/dL 48(H) - 46(H)  Creatinine 0.44 - 1.00 mg/dL 9.72(H) - 9.29(H)  BUN/Creat  Ratio 6 - 22 (calc) - - -  Sodium 135 - 145 mmol/L 133(L) 130(L) 133(L)  Potassium 3.5 - 5.1 mmol/L 5.0 4.8 5.0  Chloride 98 - 111 mmol/L 90(L) - 90(L)  CO2 22 - 32 mmol/L 27 - 27  Calcium 8.9 - 10.3 mg/dL 8.7(L) - 8.9    ROS:  Pertinent items noted in HPI and remainder of comprehensive ROS otherwise negative.  Physical Exam: Vitals:   08/23/18 0725 08/23/18 1139  BP:  113/63  Pulse:  85  Resp:  (!) 32  Temp:  98.6 F (37 C)  SpO2: 97% (!) 86%     General:  Elderly female in bed in NAD at rest on 5 liters oxygen  HEENT: NCAT Eyes: sclera anicteric Neck: trachea midline; supple Heart: S1S2; no rub Lungs: clear to auscultation bilaterally  Abdomen: softly distended/obese/NT; normal bowel sounds Extremities: no pitting edema  Skin: no rash on extremities exposed Neuro: alert and oriented to person, year, and location  Access RIJ tunneled catheter   Assessment/Plan:  # End-stage renal disease - Hemodialysis per Tuesday Thursday Saturday schedule   # Bilateral subdural hematomas - Plan per neurosurgery - no heparin with HD  # Chronic hypoxemic respiratory failure - Secondary to COPD: Also with component of overload - Optimize volume with dialysis - Note patient on 5 L oxygen here and at home  # Hyperkalemia - Renal diet is in place - Dialysis today  # Chronic hypotension - Chronic regimen of midodrine 10 mg prior to HD and another during HD; for now note 10 mg TID on TTS is ordered - follow on this regimen   # secondary hyperparathyroidism  - On calcitriol and sensipar as above - will resume sensipar after observing calcium trends; resumed calcitriol three times a week   # Anemia of chronic disease - Defer ESA for now    Claudia Desanctis 08/23/2018, 12:25 PM

## 2018-08-23 NOTE — Progress Notes (Signed)
Pt off unit to Dialysis suite.  Report given to HD  RN Vs is stable.

## 2018-08-23 NOTE — Progress Notes (Signed)
PROGRESS NOTE    Joan Mosley  KWI:[id number] DOB: 1945/02/02 DOA: 08/22/2018 PCP: Flossie Buffy, NP    Brief Narrative:  73 year old female who presented with lethargy and tremors.  She does have significant past medical history for type II that is mellitus, end-stage renal disease on hemodialysis, and COPD.  On her initial physical examination her blood pressure 176/96, heart rate 90, temperature 99.2, respiratory rate 23, oxygen saturation 99%. She was awake and alert, her lungs were clear to auscultation bilaterally, heart S1-S2 present and rhythmic, abdomen soft, no lower extremity edema.  Nonfocal. Sodium 133, potassium 5.0, chloride 90, bicarb 27, glucose 107, BUN 46, creatinine 9.29, magnesium 2.7, white count 9.8, hemoglobin 10.1, hematocrit 32.5, platelets 188.  SARS COVID-19 was negative.  CT with 15 mm bilateral subdural hematomas, which appear subacute to chronic.  Minimal midline shift to the right.  Chest radiograph, no infiltrates, right internal jugular vein hemodialysis catheter.  Hypoinflation.  EKG 88 bpm, normal axis, normal intervals, sinus rhythm, no ST segment changes, inverted T waves V1 through V5.  Patient was admitted to the hospital with encephalopathy, acute, likely related to subdural hematomas.  Assessment & Plan:   Principal Problem:   Subdural hematoma (HCC) Active Problems:   Type 2 diabetes mellitus with diabetic polyneuropathy, with long-term current use of insulin (HCC)   Hypothyroidism   Elevated troponin   ESRD (end stage renal disease) ([place])   1. Bilateral subdural hematomas with acute metabolic encephalopathy. This am patient is more awake and alert, not yet back to her baseline, no nausea or vomiting and no headache. No surgical intervention for now per neurosurgery recommendations. Will continue neuro checks, physical therapy evaluation and hold on antiplatelet therapy. Advance diet.   2. ESRD on HD. K at 5,0, positive tremors, no signs of  hypervolemic. I called nephrology to resume HD per her schedule. Continue calcitriol and furosemide.   3. HTN. Continue blood pressure monitoring.  5. T2DM. Will continue glucose cover and monitoring with insulin sliding scale, advance diet.   6. Hypothyroid. Continue levothyroxine.   7. Obesity. BMI is 45.8, patient is non ambulatory, due to osteoarthritis.   8. Chronic hypoxic respiratory due to advanced COPD. No clinical signs of acute exacerbation, continue oxymetry monitoring.   9. Anxiety. Continue clonazepam bid prn.   DVT prophylaxis: scd   Code Status: full Family Communication: no family at the bedside  Disposition Plan/ discharge barriers: pending final neurosurgery recommendations.   Body mass index is 45.82 kg/m. Malnutrition Type:      Malnutrition Characteristics:      Nutrition Interventions:     RN Pressure Injury Documentation:     Consultants:   Neurosurgery   Procedures:     Antimicrobials:       Subjective: Continue to have tremors, but no confusion or headache, no nausea or vomiting, no chest pain. Chronic dyspnea, uses home 02. Non ambulatory due to bilateral knee osteoarthritis.   Objective: Vitals:   08/23/18 0024 08/23/18 0350 08/23/18 0500 08/23/18 0725  BP: (!) 143/73 129/69    Pulse: 88 87    Resp: 20 20    Temp: 98.7 F (37.1 C) 98.6 F (37 C)    TempSrc: Oral Oral    SpO2: 100% 100%  97%  Weight:   110 kg    No intake or output data in the 24 hours ending 08/23/18 1053 Filed Weights   08/23/18 0500  Weight: 110 kg    Examination:   General: deconditioned  Neurology: Awake and alert, non focal/ positive tremors resting. No asterixis.   E ENT: mild pallor, no icterus, oral mucosa moist Cardiovascular: No JVD. S1-S2 present, rhythmic, no gallops, rubs, or murmurs. Trace lower extremity edema. Pulmonary: positive breath sounds bilaterally, decreased air movement, no wheezing, rhonchi or rales.  Gastrointestinal. Abdomen with no organomegaly, non tender, no rebound or guarding Skin. No rashes Musculoskeletal: no joint deformities     Data Reviewed: I have personally reviewed following labs and imaging studies  CBC: Recent Labs  Lab 08/22/18 1924 08/22/18 2015 08/23/18 0210  WBC 9.8  --  10.4  NEUTROABS 6.7  --   --   HGB 10.1* 9.5* 9.9*  HCT 32.5* 28.0* 31.6*  MCV 96.4  --  95.5  PLT 188  --  299   Basic Metabolic Panel: Recent Labs  Lab 08/22/18 1924 08/22/18 2015 08/23/18 0210  NA 133* 130* 133*  K 5.0 4.8 5.0  CL 90*  --  90*  CO2 27  --  27  GLUCOSE 107*  --  98  BUN 46*  --  48*  CREATININE 9.29*  --  9.72*  CALCIUM 8.9  --  8.7*  MG 2.7*  --   --    GFR: Estimated Creatinine Clearance: 5.7 mL/min (A) (by C-G formula based on SCr of 9.72 mg/dL (H)). Liver Function Tests: Recent Labs  Lab 08/22/18 1924 08/23/18 0210  AST 10* 8*  ALT 10 8  ALKPHOS 72 67  BILITOT 0.9 1.0  PROT 6.7 6.3*  ALBUMIN 3.3* 3.0*   No results for input(s): LIPASE, AMYLASE in the last 168 hours. No results for input(s): AMMONIA in the last 168 hours. Coagulation Profile: Recent Labs  Lab 08/22/18 1924  INR 1.0   Cardiac Enzymes: No results for input(s): CKTOTAL, CKMB, CKMBINDEX, TROPONINI in the last 168 hours. BNP (last 3 results) No results for input(s): PROBNP in the last 8760 hours. HbA1C: No results for input(s): HGBA1C in the last 72 hours. CBG: Recent Labs  Lab 08/22/18 1920 08/23/18 0435 08/23/18 0837  GLUCAP 82 81 88   Lipid Profile: No results for input(s): CHOL, HDL, LDLCALC, TRIG, CHOLHDL, LDLDIRECT in the last 72 hours. Thyroid Function Tests: No results for input(s): TSH, T4TOTAL, FREET4, T3FREE, THYROIDAB in the last 72 hours. Anemia Panel: No results for input(s): VITAMINB12, FOLATE, FERRITIN, TIBC, IRON, RETICCTPCT in the last 72 hours.    Radiology Studies: I have reviewed all of the imaging during this hospital visit personally      Scheduled Meds: . calcitRIOL  0.25 mcg Oral Daily  . [START ON 08/24/2018] furosemide  80 mg Oral Once per day on Sun Mon Wed Fri  . insulin aspart  0-9 Units Subcutaneous Q4H  . ipratropium  0.5 mg Inhalation QID  . levothyroxine  112 mcg Oral QAC breakfast  . midodrine  10 mg Oral 3 times per day on Tue Thu Sat  . montelukast  10 mg Oral QHS  . rosuvastatin  40 mg Oral Daily  . senna-docusate  1 tablet Oral QHS  . sevelamer carbonate  1,600 mg Oral TID WC  . sodium chloride flush  3 mL Intravenous Q12H   Continuous Infusions: . sodium chloride       LOS: 1 day        Demontrae Gilbert Gerome Apley, MD

## 2018-08-23 NOTE — Consult Note (Signed)
Reason for Consult: Bilateral subdural hematomas Referring Physician: Dr. [name] is an 73 y.o. female.  HPI: The patient is a 73 year old unhealthy white female with COPD, diabetes mellitus, end-stage renal disease, etc. who was admitted yesterday with generalized weakness.  The work-up included a head CT which demonstrated bilateral subdural hematomas.  A neurosurgical consultation has been requested.  Presently the patient is alert and pleasant.  She denies headaches, seizures, etc.  She complains of generalized weakness.  By report she gets heparin with her hemodialysis and takes Plavix.  She denies trauma, falls, etc.  She tells me that Dr. Wardell Mosley in [facility] has told her that she cannot have surgery because she cannot have general anesthesia.  Past Medical History:  Diagnosis Date  . Arthritis   . COPD (chronic obstructive pulmonary disease) ([place])   . Diabetes mellitus without complication ([place])   . Diabetic neuropathy ([place])   . Oxygen dependent 03/30/2018   PT uses O2  all the time  . Renal disorder    ESRD  . Thyroid disease   . TIA (transient ischemic attack)     Past Surgical History:  Procedure Laterality Date  . ABDOMINAL HYSTERECTOMY    . RECTOCELE REPAIR    . REPLACEMENT TOTAL KNEE BILATERAL Bilateral 2008  . THYROIDECTOMY     80%  . VAGINAL WOUND CLOSURE / REPAIR      Family History  Problem Relation Age of Onset  . Heart disease Mother   . Hypertension Mother   . Heart disease Father   . Hypertension Father     Social History:  reports that she quit smoking about 37 years ago. She has never used smokeless tobacco. She reports that she does not drink alcohol or use drugs.  Allergies:  Allergies  Allergen Reactions  . Avelox [Moxifloxacin Hcl In Nacl]     PT does not remember  . Codeine Anaphylaxis    PT does not remember  . Other     PT does not remember  . Penicillins     Rash  . Sulfa Antibiotics     PT does nolt remember   . Shellfish Allergy Hives, Itching and Rash    Medications:  I have reviewed the patient's current medications. Prior to Admission:  Medications Prior to Admission  Medication Sig Dispense Refill Last Dose  . acetaminophen (TYLENOL) 500 MG tablet Take 1,000 mg by mouth every 6 (six) hours as needed for mild pain.     Marland Kitchen albuterol (VENTOLIN HFA) 108 (90 Base) MCG/ACT inhaler Inhale 2 puffs into the lungs every 6 (six) hours as needed for wheezing or shortness of breath.     Marland Kitchen aspirin (BAYER Mosley DOSE) 81 MG EC tablet Take 81 mg by mouth daily. Swallow whole.     . calcitRIOL (ROCALTROL) 0.25 MCG capsule Take 0.25 mcg by mouth daily.     . clonazePAM (KLONOPIN) 1 MG tablet Take 1 tablet (1 mg total) by mouth 2 (two) times daily as needed for anxiety. Change in dose 60 tablet 2   . clopidogrel (PLAVIX) 75 MG tablet Take 1 tablet (75 mg total) by mouth daily. 90 tablet 1   . FLUoxetine (PROZAC) 10 MG tablet Take 1 tablet (10 mg total) by mouth daily. 30 tablet 5   . furosemide (LASIX) 80 MG tablet Take 80 mg by mouth See admin instructions. Sunday, Monday,Wed,Friday--in AM      . insulin detemir (LEVEMIR) 100 UNIT/ML injection Inject 0.15 mLs (  15 Units total) into the skin daily. 10 mL 2   . insulin lispro (HUMALOG) 100 UNIT/ML injection Inject 1-5 Units into the skin 3 (three) times daily with meals as needed for high blood sugar. Sliding scale      . ipratropium (ATROVENT) 0.02 % nebulizer solution Inhale 0.5 mg into the lungs 4 (four) times daily.      Marland Kitchen levothyroxine (SYNTHROID) 112 MCG tablet Take 1 tablet (112 mcg total) by mouth daily before breakfast. 1 AM 90 tablet 0   . lidocaine (LIDODERM) 5 % Place 1 patch onto the skin every 12 (twelve) hours as needed (back pain). Remove & Discard patch within 12 hours or as directed by MD      . midodrine (PROAMATINE) 10 MG tablet Take 10 mg by mouth 3 (three) times daily. Taking on Dialysis days, Tues, Thur, Sat.     . montelukast (SINGULAIR) 10 MG  tablet Take 10 mg by mouth at bedtime. PM     . nystatin (MYCOSTATIN/NYSTOP) powder Apply 1 g topically 2 (two) times daily as needed (skin rash).      . nystatin cream (MYCOSTATIN) Apply 1 application topically 2 (two) times daily as needed for dry skin.      Marland Kitchen ondansetron (ZOFRAN) 4 MG tablet Take 1 tablet (4 mg total) by mouth every 6 (six) hours. (Patient not taking: Reported on 08/08/2018) 12 tablet 0   . rosuvastatin (CRESTOR) 40 MG tablet Take 40 mg by mouth daily. AM     . senna-docusate (SENNA PLUS) 8.6-50 MG tablet Take 1 tablet by mouth at bedtime. 90 tablet 1   . sevelamer (RENAGEL) 800 MG tablet Take 1,600 mg by mouth 3 (three) times daily with meals. 1600 mg --3 times daily--take additional 1-2 if eats snack      Scheduled: . calcitRIOL  0.25 mcg Oral Daily  . [START ON 08/24/2018] furosemide  80 mg Oral Once per day on Sun Mon Wed Fri  . insulin aspart  0-9 Units Subcutaneous Q4H  . ipratropium  0.5 mg Inhalation QID  . levothyroxine  112 mcg Oral QAC breakfast  . midodrine  10 mg Oral 3 times per day on Tue Thu Sat  . montelukast  10 mg Oral QHS  . rosuvastatin  40 mg Oral Daily  . senna-docusate  1 tablet Oral QHS  . sevelamer carbonate  1,600 mg Oral TID WC  . sodium chloride flush  3 mL Intravenous Q12H   Continuous: . sodium chloride     XIP:JASNKN chloride, acetaminophen **OR** acetaminophen, albuterol, clonazePAM, hydrALAZINE, sodium chloride flush Anti-infectives (From admission, onward)   None       Results for orders placed or performed during the hospital encounter of 08/22/18 (from the past 48 hour(s))  CBG monitoring, ED     Status: None   Collection Time: 08/22/18  7:20 PM  Result Value Ref Range   Glucose-Capillary 82 70 - 99 mg/dL  CBC with Differential     Status: Abnormal   Collection Time: 08/22/18  7:24 PM  Result Value Ref Range   WBC 9.8 4.0 - 10.5 K/uL   RBC 3.37 (L) 3.87 - 5.11 MIL/uL   Hemoglobin 10.1 (L) 12.0 - 15.0 g/dL   HCT 32.5 (L)  36.0 - 46.0 %   MCV 96.4 80.0 - 100.0 fL   MCH 30.0 26.0 - 34.0 pg   MCHC 31.1 30.0 - 36.0 g/dL   RDW 15.7 (H) 11.5 - 15.5 %   Platelets 188  150 - 400 K/uL   nRBC 0.0 0.0 - 0.2 %   Neutrophils Relative % 69 %   Neutro Abs 6.7 1.7 - 7.7 K/uL   Lymphocytes Relative 19 %   Lymphs Abs 1.8 0.7 - 4.0 K/uL   Monocytes Relative 9 %   Monocytes Absolute 0.9 0.1 - 1.0 K/uL   Eosinophils Relative 3 %   Eosinophils Absolute 0.3 0.0 - 0.5 K/uL   Basophils Relative 0 %   Basophils Absolute 0.0 0.0 - 0.1 K/uL   Immature Granulocytes 0 %   Abs Immature Granulocytes 0.02 0.00 - 0.07 K/uL    Comment: Performed at [facility] [address] [place], [place] [address]  Comprehensive metabolic panel     Status: Abnormal   Collection Time: 08/22/18  7:24 PM  Result Value Ref Range   Sodium 133 (L) 135 - 145 mmol/L   Potassium 5.0 3.5 - 5.1 mmol/L   Chloride 90 (L) 98 - 111 mmol/L   CO2 27 22 - 32 mmol/L   Glucose, Bld 107 (H) 70 - 99 mg/dL   BUN 46 (H) 8 - 23 mg/dL   Creatinine, Ser 9.29 (H) 0.44 - 1.00 mg/dL   Calcium 8.9 8.9 - 10.3 mg/dL   Total Protein 6.7 6.5 - 8.1 g/dL   Albumin 3.3 (L) 3.5 - 5.0 g/dL   AST 10 (L) 15 - 41 U/L   ALT 10 0 - 44 U/L   Alkaline Phosphatase 72 38 - 126 U/L   Total Bilirubin 0.9 0.3 - 1.2 mg/dL   GFR calc non Af Amer 4 (L) >60 mL/min   GFR calc Af Amer 4 (L) >60 mL/min   Anion gap 16 (H) 5 - 15    Comment: Performed at [facility], [place] [address], [place], [place] [address]  Protime-INR     Status: None   Collection Time: 08/22/18  7:24 PM  Result Value Ref Range   Prothrombin Time 13.2 11.4 - 15.2 seconds   INR 1.0 0.8 - 1.2    Comment: (NOTE) INR goal varies based on device and disease states. Performed at [facility], [place] [address], [place], [place] [address]   Troponin I (High Sensitivity)     Status: Abnormal   Collection Time: 08/22/18  7:24 PM  Result Value Ref Range   Troponin I (High Sensitivity) 20 (H) <18 ng/L     Comment: (NOTE) Elevated high sensitivity troponin I (hsTnI) values and significant  changes across serial measurements may suggest ACS but many other  chronic and acute conditions are known to elevate hsTnI results.  Refer to the "Links" section for chest pain algorithms and additional  guidance. Performed at [facility], [place] [address], [place], [place] [address]   Magnesium     Status: Abnormal   Collection Time: 08/22/18  7:24 PM  Result Value Ref Range   Magnesium 2.7 (H) 1.7 - 2.4 mg/dL    Comment: Performed at [facility] [address], [place], [place] [address]  I-STAT 7, (LYTES, BLD GAS, ICA, H+H)     Status: Abnormal   Collection Time: 08/22/18  8:15 PM  Result Value Ref Range   pH, Arterial 7.439 7.350 - 7.450   pCO2 arterial 41.5 32.0 - 48.0 mmHg   pO2, Arterial 122.0 (H) 83.0 - 108.0 mmHg   Bicarbonate 28.1 (H) 20.0 - 28.0 mmol/L   TCO2 29 22 - 32 mmol/L   O2  Saturation 99.0 %   Acid-Base Excess 4.0 (H) 0.0 - 2.0 mmol/L   Sodium 130 (L) 135 - 145 mmol/L   Potassium 4.8 3.5 - 5.1 mmol/L   Calcium, Ion 1.10 (L) 1.15 - 1.40 mmol/L   HCT 28.0 (L) 36.0 - 46.0 %   Hemoglobin 9.5 (L) 12.0 - 15.0 g/dL   Patient temperature 98.7 F    Collection site RADIAL, ALLEN'S TEST ACCEPTABLE    Drawn by RT    Sample type ARTERIAL   SARS Coronavirus 2 (CEPHEID - Performed in [facility] hospital lab), Hosp Order     Status: None   Collection Time: 08/22/18 10:49 PM   Specimen: Nasopharyngeal Swab  Result Value Ref Range   SARS Coronavirus 2 NEGATIVE NEGATIVE    Comment: (NOTE) If result is NEGATIVE SARS-CoV-2 target nucleic acids are NOT DETECTED. The SARS-CoV-2 RNA is generally detectable in upper and lower  respiratory specimens during the acute phase of infection. The lowest  concentration of SARS-CoV-2 viral copies this assay can detect is 250  copies / mL. A negative result does not preclude SARS-CoV-2 infection  and should not be used as the sole basis for  treatment or other  patient management decisions.  A negative result may occur with  improper specimen collection / handling, submission of specimen other  than nasopharyngeal swab, presence of viral mutation(s) within the  areas targeted by this assay, and inadequate number of viral copies  (<250 copies / mL). A negative result must be combined with clinical  observations, patient history, and epidemiological information. If result is POSITIVE SARS-CoV-2 target nucleic acids are DETECTED. The SARS-CoV-2 RNA is generally detectable in upper and lower  respiratory specimens dur ing the acute phase of infection.  Positive  results are indicative of active infection with SARS-CoV-2.  Clinical  correlation with patient history and other diagnostic information is  necessary to determine patient infection status.  Positive results do  not rule out bacterial infection or co-infection with other viruses. If result is PRESUMPTIVE POSTIVE SARS-CoV-2 nucleic acids MAY BE PRESENT.   A presumptive positive result was obtained on the submitted specimen  and confirmed on repeat testing.  While 2019 novel coronavirus  (SARS-CoV-2) nucleic acids may be present in the submitted sample  additional confirmatory testing may be necessary for epidemiological  and / or clinical management purposes  to differentiate between  SARS-CoV-2 and other Sarbecovirus currently known to infect humans.  If clinically indicated additional testing with an alternate test  methodology [phone]) is advised. The SARS-CoV-2 RNA is generally  detectable in upper and lower respiratory sp ecimens during the acute  phase of infection. The expected result is Negative. Fact Sheet for Patients:  StrictlyIdeas.no Fact Sheet for Healthcare Providers: BankingDealers.co.za This test is not yet approved or cleared by the [place] FDA and has been authorized for detection and/or  diagnosis of SARS-CoV-2 by FDA under an Emergency Use Authorization (EUA).  This EUA will remain in effect (meaning this test can be used) for the duration of the COVID-19 declaration under Section 564(b)(1) of the Act, 21 U.S.C. section 360bbb-3(b)(1), unless the authorization is terminated or revoked sooner. Performed at [facility], [place] [address], [place], [place] [address]   Troponin I (High Sensitivity)     Status: Abnormal   Collection Time: 08/23/18  2:10 AM  Result Value Ref Range   Troponin I (High Sensitivity) 19 (H) <18 ng/L    Comment: (NOTE) Elevated high sensitivity troponin I (  hsTnI) values and significant  changes across serial measurements may suggest ACS but many other  chronic and acute conditions are known to elevate hsTnI results.  Refer to the "Links" section for chest pain algorithms and additional  guidance. Performed at [facility], [place] [address] [place], [place] [address]   Comprehensive metabolic panel     Status: Abnormal   Collection Time: 08/23/18  2:10 AM  Result Value Ref Range   Sodium 133 (L) 135 - 145 mmol/L   Potassium 5.0 3.5 - 5.1 mmol/L   Chloride 90 (L) 98 - 111 mmol/L   CO2 27 22 - 32 mmol/L   Glucose, Bld 98 70 - 99 mg/dL   BUN 48 (H) 8 - 23 mg/dL   Creatinine, Ser 9.72 (H) 0.44 - 1.00 mg/dL   Calcium 8.7 (L) 8.9 - 10.3 mg/dL   Total Protein 6.3 (L) 6.5 - 8.1 g/dL   Albumin 3.0 (L) 3.5 - 5.0 g/dL   AST 8 (L) 15 - 41 U/L   ALT 8 0 - 44 U/L   Alkaline Phosphatase 67 38 - 126 U/L   Total Bilirubin 1.0 0.3 - 1.2 mg/dL   GFR calc non Af Amer 4 (L) >60 mL/min   GFR calc Af Amer 4 (L) >60 mL/min   Anion gap 16 (H) 5 - 15    Comment: Performed at [facility], [place] [address], [place], [place] [address]  CBC     Status: Abnormal   Collection Time: 08/23/18  2:10 AM  Result Value Ref Range   WBC 10.4 4.0 - 10.5 K/uL   RBC 3.31 (L) 3.87 - 5.11 MIL/uL   Hemoglobin 9.9 (L) 12.0 - 15.0 g/dL   HCT 31.6 (L) 36.0 - 46.0  %   MCV 95.5 80.0 - 100.0 fL   MCH 29.9 26.0 - 34.0 pg   MCHC 31.3 30.0 - 36.0 g/dL   RDW 15.7 (H) 11.5 - 15.5 %   Platelets 169 150 - 400 K/uL   nRBC 0.0 0.0 - 0.2 %    Comment: Performed at [facility], [place] [address], [place], [place] [address]  Glucose, capillary     Status: None   Collection Time: 08/23/18  4:35 AM  Result Value Ref Range   Glucose-Capillary 81 70 - 99 mg/dL    Dg Chest 2 View  Result Date: 08/22/2018 CLINICAL DATA:  Weakness, altered level of consciousness EXAM: CHEST - 2 VIEW COMPARISON:  08/02/2018 FINDINGS: Lungs are clear.  No pleural effusion or pneumothorax. Cardiomegaly. Right IJ dual lumen dialysis catheter terminates in the upper right atrium. Mild degenerative changes of the visualized thoracolumbar spine. IMPRESSION: Normal chest radiographs. Electronically Signed   By: Julian Hy M.D.   On: 08/22/2018 19:52   Ct Head Wo Contrast  Result Date: 08/22/2018 CLINICAL DATA:  Altered level of consciousness. EXAM: CT HEAD WITHOUT CONTRAST TECHNIQUE: Contiguous axial images were obtained from the base of the skull through the vertex without intravenous contrast. COMPARISON:  None. FINDINGS: Brain: Bilateral extra-axial fluid collections compatible with subdural hematomas that appears subacute to chronic. Majority of the fluid is Mosley-density. There are small areas of higher density. Multiple septations are seen within the fluid. Right-sided collection measures approximately 14 mm and left sided collection measures 15 mm. Minimal midline shift to the right. Negative for hydrocephalus. No acute infarct. Chronic ischemic change in the white matter. Vascular: Negative for hyperdense vessel Skull: Negative Sinuses/Orbits: Mucosal edema paranasal sinuses  most notably right frontal ethmoid and maxillary sinuses. Bony thickening of the maxillary sinus bilaterally Other: None IMPRESSION: 15 mm bilateral subdural hematomas which appears subacute to chronic. Majority  of the fluid is Mosley-density without evidence of acute bleeding. Minimal midline shift to the right. These results were called by telephone at the time of interpretation on 08/22/2018 at 9:18 pm to Dr. Isla Pence , who verbally acknowledged these results. Electronically Signed   By: Franchot Gallo M.D.   On: 08/22/2018 21:19    ROS: As above Blood pressure 129/69, pulse 87, temperature 98.6 F (37 C), temperature source Oral, resp. rate 20, weight 110 kg, SpO2 97 %. Estimated body mass index is 45.82 kg/m as calculated from the following:   Height as of 08/08/18: 5\' 1"  (1.549 m).   Weight as of this encounter: 110 kg.  Physical Exam  General: An alert and pleasant obese 73 year old white female in no apparent distress.  HEENT: Normocephalic, extraocular muscles are intact, pupils are equal.  She has poor dentition and few remaining teeth.  Neck: Supple without masses or deformities.  She has a limited range of motion.  Thorax: Symmetric  Abdomen: Soft and obese  Extremities: Unremarkable  Neurologic exam: The patient is alert and oriented x3.  Cranial nerves II through XII were examined bilaterally and grossly intact.  Vision and hearing are grossly normal.  Her motor strength is grossly normal in her bilateral handgrip, bicep, gastrocnemius, and dorsiflexors.  Cerebellar function is intact to rapid alternating movements of the upper extremities bilaterally.  Sensory function is grossly normal to light touch sensation all tested dermatomes bilaterally.  Imaging studies I have reviewed the patient's head CT performed yesterday.  She has bilateral subacute to chronic subdural hematomas with mild mass-effect.  Assessment/Plan: Bilateral subdural hematomas: I am not convinced that this is the source of her generalized weakness, however it could be.  Evidently the patient has been told by a doctor in [place] that her COPD is too bad to have general anesthesia.  I would suggest a  medical tune up, holding her Plavix and heparin, and observation.  We will plan to repeat her CAT scan in a few days.  If she does not improve we can consider bilateral bur holes, but as above, she is not a great surgical candidate. Joan Mosley 08/23/2018, 8:04 AM

## 2018-08-23 NOTE — Progress Notes (Signed)
  Echocardiogram 2D Echocardiogram has been performed.  Joan Mosley 08/23/2018, 2:11 PM

## 2018-08-23 NOTE — Plan of Care (Signed)
Progressing towards goals

## 2018-08-24 DIAGNOSIS — E1142 Type 2 diabetes mellitus with diabetic polyneuropathy: Secondary | ICD-10-CM

## 2018-08-24 DIAGNOSIS — Z794 Long term (current) use of insulin: Secondary | ICD-10-CM

## 2018-08-24 LAB — GLUCOSE, CAPILLARY
Glucose-Capillary: 108 mg/dL — ABNORMAL HIGH (ref 70–99)
Glucose-Capillary: 114 mg/dL — ABNORMAL HIGH (ref 70–99)
Glucose-Capillary: 138 mg/dL — ABNORMAL HIGH (ref 70–99)
Glucose-Capillary: 80 mg/dL (ref 70–99)
Glucose-Capillary: 94 mg/dL (ref 70–99)
Glucose-Capillary: 99 mg/dL (ref 70–99)

## 2018-08-24 MED ORDER — ALTEPLASE 2 MG IJ SOLR: 2.0000 mg | Freq: Once | INTRAMUSCULAR | Status: DC | PRN

## 2018-08-24 MED ORDER — HEPARIN SODIUM (PORCINE) 1000 UNIT/ML DIALYSIS
1000.0000 [IU] | INTRAMUSCULAR | Status: DC | PRN
  Administered 2018-08-24: 1000 [IU] via INTRAVENOUS_CENTRAL
  Filled 2018-08-24: qty 1

## 2018-08-24 MED ORDER — PENTAFLUOROPROP-TETRAFLUOROETH EX AERO: 1.0000 "application " | INHALATION_SPRAY | CUTANEOUS | Status: DC | PRN

## 2018-08-24 MED ORDER — HEPARIN SODIUM (PORCINE) 1000 UNIT/ML IJ SOLN
INTRAMUSCULAR | Status: AC
  Filled 2018-08-24: qty 4

## 2018-08-24 MED ORDER — SODIUM CHLORIDE 0.9 % IV SOLN: 100.0000 mL | INTRAVENOUS | Status: DC | PRN

## 2018-08-24 MED ORDER — LIDOCAINE-PRILOCAINE 2.5-2.5 % EX CREA: 1.0000 "application " | TOPICAL_CREAM | CUTANEOUS | Status: DC | PRN

## 2018-08-24 MED ORDER — LIDOCAINE HCL (PF) 1 % IJ SOLN: 5.0000 mL | INTRAMUSCULAR | Status: DC | PRN

## 2018-08-24 MED ORDER — CHLORHEXIDINE GLUCONATE CLOTH 2 % EX PADS
6.0000 | MEDICATED_PAD | Freq: Every day | CUTANEOUS | Status: DC
  Administered 2018-08-24 – 2018-08-25 (×2): 6 via TOPICAL

## 2018-08-24 MED ORDER — CINACALCET HCL 30 MG PO TABS
60.0000 mg | ORAL_TABLET | ORAL | Status: DC
  Administered 2018-08-25: 17:00:00 60 mg via ORAL
  Filled 2018-08-24: qty 2

## 2018-08-24 NOTE — Progress Notes (Signed)
   Providing Compassionate, Quality Care - Together   Subjective: Patient reports no issues overnight. She is on the phone with her daughter during this assessment. Nursing staff reports no changes in neuro status.  Objective: Vital signs in last 24 hours: Temp:  [97.8 F (36.6 C)-98.8 F (37.1 C)] 97.8 F (36.6 C) (07/29 0749) Pulse Rate:  [81-93] 87 (07/29 0749) Resp:  [16-32] 20 (07/29 0749) BP: (97-156)/(39-89) 119/89 (07/29 0749) SpO2:  [86 %-98 %] 94 % (07/29 0749) Weight:  [107.6 kg-110.5 kg] 107.6 kg (07/29 0257)  Intake/Output from previous day: 07/28 0701 - 07/29 0700 In: 240 [P.O.:240] Out: 3000  Intake/Output this shift: No intake/output data recorded.  Alert and oriented x 3 PERRLA Speech fluent CN II-XII grossly intact MAE, strength 5/5 BUE, BLE    Lab Results: Recent Labs    08/22/18 1924 08/22/18 2015 08/23/18 0210  WBC 9.8  --  10.4  HGB 10.1* 9.5* 9.9*  HCT 32.5* 28.0* 31.6*  PLT 188  --  169   BMET Recent Labs    08/22/18 1924 08/22/18 2015 08/23/18 0210  NA 133* 130* 133*  K 5.0 4.8 5.0  CL 90*  --  90*  CO2 27  --  27  GLUCOSE 107*  --  98  BUN 46*  --  48*  CREATININE 9.29*  --  9.72*  CALCIUM 8.9  --  8.7*    Studies/Results: Dg Chest 2 View  Result Date: 08/22/2018 CLINICAL DATA:  Weakness, altered level of consciousness EXAM: CHEST - 2 VIEW COMPARISON:  08/02/2018 FINDINGS: Lungs are clear.  No pleural effusion or pneumothorax. Cardiomegaly. Right IJ dual lumen dialysis catheter terminates in the upper right atrium. Mild degenerative changes of the visualized thoracolumbar spine. IMPRESSION: Normal chest radiographs. Electronically Signed   By: Julian Hy M.D.   On: 08/22/2018 19:52   Ct Head Wo Contrast  Result Date: 08/22/2018 CLINICAL DATA:  Altered level of consciousness. EXAM: CT HEAD WITHOUT CONTRAST TECHNIQUE: Contiguous axial images were obtained from the base of the skull through the vertex without  intravenous contrast. COMPARISON:  None. FINDINGS: Brain: Bilateral extra-axial fluid collections compatible with subdural hematomas that appears subacute to chronic. Majority of the fluid is low-density. There are small areas of higher density. Multiple septations are seen within the fluid. Right-sided collection measures approximately 14 mm and left sided collection measures 15 mm. Minimal midline shift to the right. Negative for hydrocephalus. No acute infarct. Chronic ischemic change in the white matter. Vascular: Negative for hyperdense vessel Skull: Negative Sinuses/Orbits: Mucosal edema paranasal sinuses most notably right frontal ethmoid and maxillary sinuses. Bony thickening of the maxillary sinus bilaterally Other: None IMPRESSION: 15 mm bilateral subdural hematomas which appears subacute to chronic. Majority of the fluid is low-density without evidence of acute bleeding. Minimal midline shift to the right. These results were called by telephone at the time of interpretation on 08/22/2018 at 9:18 pm to Dr. Isla Pence , who verbally acknowledged these results. Electronically Signed   By: Franchot Gallo M.D.   On: 08/22/2018 21:19    Assessment/Plan: Patient with bilateral subdural hematomas. Neuro exam remains unchanged. No surgical recommendation at this time.   LOS: 2 days    -Repeat CT scan tomorrow morning -Continue to mobilize   Viona Gilmore, DNP, AGNP-C Nurse Practitioner  [facility] [place]. [address], [place], [place] [address] P: [phone]    F: [phone]  08/24/2018, 9:51 AM

## 2018-08-24 NOTE — Progress Notes (Signed)
Renal Navigator notified that patient could potentially discharge from the hospital tomorrow, 08/25/18. Renal Navigator has rescheduled patient's OP HD treatment from regular am TTS chair to 12:30pm on 08/25/18 so that if she is ready for discharge, she can be discharged first and then receive OP HD at her [facility] clinic[facility]. She should arrive at 12:10pm if possible. Renal Navigator will follow up on patient's status tomorrow and with the [facility] clinic regarding this appointment. [facility] clinic staff states that patient is transported by her daughter to OP HD.   Alphonzo Cruise, [place] Renal Navigator  [phone]

## 2018-08-24 NOTE — Progress Notes (Signed)
PROGRESS NOTE    Joan Mosley  YWV:[id number] DOB: 1945-11-19 DOA: 08/22/2018 PCP: Flossie Buffy, NP    Brief Narrative:  73 year old female who presented with lethargy and tremors.  She does have significant past medical history for type II that is mellitus, end-stage renal disease on hemodialysis, and COPD.  On her initial physical examination her blood pressure 176/96, heart rate 90, temperature 99.2, respiratory rate 23, oxygen saturation 99%. She was awake and alert, her lungs were clear to auscultation bilaterally, heart S1-S2 present and rhythmic, abdomen soft, no lower extremity edema.  Nonfocal. Sodium 133, potassium 5.0, chloride 90, bicarb 27, glucose 107, BUN 46, creatinine 9.29, magnesium 2.7, white count 9.8, hemoglobin 10.1, hematocrit 32.5, platelets 188.  SARS COVID-19 was negative.  CT with 15 mm bilateral subdural hematomas, which appear subacute to chronic.  Minimal midline shift to the right.  Chest radiograph, no infiltrates, right internal jugular vein hemodialysis catheter.  Hypoinflation.  EKG 88 bpm, normal axis, normal intervals, sinus rhythm, no ST segment changes, inverted T waves V1 through V5.  Patient was admitted to the hospital with encephalopathy, acute, likely related to subdural hematomas.   Assessment & Plan:   Principal Problem:   Subdural hematoma (HCC) Active Problems:   Type 2 diabetes mellitus with diabetic polyneuropathy, with long-term current use of insulin (HCC)   Hypothyroidism   Elevated troponin   ESRD (end stage renal disease) ([place])   1. Bilateral subdural hematomas with acute metabolic encephalopathy. No significant change in her clinical condition, patient continue to be very weak and deconditioned, no headache, no nausea or vomiting. Continue neuro checks and plan for repeat head CT in am. Follow with physical therapy recommendations. Patient has been not ambulatory at home.   2. ESRD on HD. Patient had HD per her schedule,  continue follow up with nephrology recommendations. Metabolic bone disease, continue calcitriol, renvela and cincalcet. On furosemide and midodrine on HD days.   3. HTN. Blood pressure has been stable.   5. T2DM. Continue with sulin sliding scale, for glucose cover and monitoring.   6. Hypothyroid. On levothyroxine.   7. Obesity with bilateral knee osteoarthritis and dyslipidemia. BMI is 45.8, follow with physical therapy recommendations. Continue rosuvastatin.   8. Chronic hypoxic respiratory due to advanced COPD. Continue supplemental 02 and further oxymetry monitoring.   9. Anxiety. Tolerating well clonazepam bid prn.   DVT prophylaxis: scd   Code Status: full Family Communication: no family at the bedside  Disposition Plan/ discharge barriers: pending final neurosurgery recommendations. Repeat head CT , possible dc in am.     Body mass index is 44.82 kg/m. Malnutrition Type:      Malnutrition Characteristics:      Nutrition Interventions:     RN Pressure Injury Documentation:     Consultants:   Neurosurgery   Procedures:     Antimicrobials:       Subjective: Patient denies any headache, no nausea or vomiting. Continue to be very weak and deconditioned, no chest pain or dyspnea. Apparently had head trauma with no loss of consciousness, while entering a car in the recent past.   Objective: Vitals:   08/24/18 0230 08/24/18 0257 08/24/18 0421 08/24/18 0749  BP: (!) 103/56 (!) 130/47 127/74 119/89  Pulse: 89 87 93 87  Resp:  17 16 20   Temp:  98.4 F (36.9 C) 98.6 F (37 C) 97.8 F (36.6 C)  TempSrc:  Oral Oral Oral  SpO2:  98% 96% 94%  Weight:  107.6  kg      Intake/Output Summary (Last 24 hours) at 08/24/2018 1034 Last data filed at 08/24/2018 0257 Gross per 24 hour  Intake 240 ml  Output 3000 ml  Net -2760 ml   Filed Weights   08/23/18 0500 08/23/18 2320 08/24/18 0257  Weight: 110 kg 110.5 kg 107.6 kg    Examination:    General: deconditioned  Neurology: Awake and alert, non focal  E ENT: mild pallor, no icterus, oral mucosa moist Cardiovascular: No JVD. S1-S2 present, rhythmic, no gallops, rubs, or murmurs. No lower extremity edema. Pulmonary: positive breath sounds bilaterally, decreased air movement, no wheezing, rhonchi or rales. Gastrointestinal. Abdomen flat, no organomegaly, non tender, no rebound or guarding Skin. No rashes Musculoskeletal: no joint deformities     Data Reviewed: I have personally reviewed following labs and imaging studies  CBC: Recent Labs  Lab 08/22/18 1924 08/22/18 2015 08/23/18 0210  WBC 9.8  --  10.4  NEUTROABS 6.7  --   --   HGB 10.1* 9.5* 9.9*  HCT 32.5* 28.0* 31.6*  MCV 96.4  --  95.5  PLT 188  --  885   Basic Metabolic Panel: Recent Labs  Lab 08/22/18 1924 08/22/18 2015 08/23/18 0210  NA 133* 130* 133*  K 5.0 4.8 5.0  CL 90*  --  90*  CO2 27  --  27  GLUCOSE 107*  --  98  BUN 46*  --  48*  CREATININE 9.29*  --  9.72*  CALCIUM 8.9  --  8.7*  MG 2.7*  --   --    GFR: Estimated Creatinine Clearance: 5.7 mL/min (A) (by C-G formula based on SCr of 9.72 mg/dL (H)). Liver Function Tests: Recent Labs  Lab 08/22/18 1924 08/23/18 0210  AST 10* 8*  ALT 10 8  ALKPHOS 72 67  BILITOT 0.9 1.0  PROT 6.7 6.3*  ALBUMIN 3.3* 3.0*   No results for input(s): LIPASE, AMYLASE in the last 168 hours. No results for input(s): AMMONIA in the last 168 hours. Coagulation Profile: Recent Labs  Lab 08/22/18 1924  INR 1.0   Cardiac Enzymes: No results for input(s): CKTOTAL, CKMB, CKMBINDEX, TROPONINI in the last 168 hours. BNP (last 3 results) No results for input(s): PROBNP in the last 8760 hours. HbA1C: No results for input(s): HGBA1C in the last 72 hours. CBG: Recent Labs  Lab 08/23/18 1603 08/23/18 1953 08/23/18 2310 08/24/18 0422 08/24/18 0747  GLUCAP 80 107* 137* 94 138*   Lipid Profile: No results for input(s): CHOL, HDL, LDLCALC, TRIG,  CHOLHDL, LDLDIRECT in the last 72 hours. Thyroid Function Tests: No results for input(s): TSH, T4TOTAL, FREET4, T3FREE, THYROIDAB in the last 72 hours. Anemia Panel: No results for input(s): VITAMINB12, FOLATE, FERRITIN, TIBC, IRON, RETICCTPCT in the last 72 hours.    Radiology Studies: I have reviewed all of the imaging during this hospital visit personally     Scheduled Meds: . calcitRIOL  1.5 mcg Oral 3 times weekly  . Chlorhexidine Gluconate Cloth  6 each Topical Q0600  . Chlorhexidine Gluconate Cloth  6 each Topical Q0600  . [START ON 08/25/2018] cinacalcet  60 mg Oral Q T,Th,Sat-1800  . furosemide  80 mg Oral Once per day on Sun Mon Wed Fri  . insulin aspart  0-9 Units Subcutaneous Q4H  . levothyroxine  112 mcg Oral QAC breakfast  . midodrine  10 mg Oral 3 times per day on Tue Thu Sat  . montelukast  10 mg Oral QHS  . rosuvastatin  40 mg Oral Daily  . senna-docusate  1 tablet Oral QHS  . sevelamer carbonate  1,600 mg Oral TID WC  . sodium chloride flush  3 mL Intravenous Q12H   Continuous Infusions: . sodium chloride    . sodium chloride    . sodium chloride       LOS: 2 days         Gerome Apley, MD

## 2018-08-24 NOTE — Plan of Care (Signed)
Progressing towards goals

## 2018-08-24 NOTE — Evaluation (Signed)
Physical Therapy Evaluation Patient Details Name: Joan Mosley MRN: [id number] DOB: January 11, 1946 Today's Date: 08/24/2018   History of Present Illness  Pt is a 73 y/o female admitted secondary to worsening weakness. Pt found to have bilateral subdural hematomas; per notes to be managed conservatively at this time. PMH includes DM, COPD on home O2, and ESRD on HD.   Clinical Impression  Pt admitted secondary to problem above with deficits below. Pt requiring max encouragement to participate this session and only agreeable to sit at EOB. Pt requiring mod A for bed mobility tasks. Pt kept repeating "I can't walk, I don't have knees," so would not attempt standing this session. Per pt she required assist to transfer to [facility], and per RN, family reports she has required increased assist from 2 people here lately. Feel pt would benefit from short term SNF to improve independence and safety with transfers. Will continue to follow acutely to maximize functional mobility independence and safety.     Follow Up Recommendations SNF;Supervision/Assistance - 24 hour    Equipment Recommendations  None recommended by PT    Recommendations for Other Services       Precautions / Restrictions Precautions Precautions: Fall Restrictions Weight Bearing Restrictions: No      Mobility  Bed Mobility Overal bed mobility: Needs Assistance Bed Mobility: Supine to Sit     Supine to sit: Mod assist     General bed mobility comments: Mod A for trunk elevation and assist to scoot hips towards EOB. Pt requiring extended time to perform tasks.   Transfers                 General transfer comment: Pt refusing to stand  Ambulation/Gait                Stairs            Wheelchair Mobility    Modified Rankin (Stroke Patients Only)       Balance Overall balance assessment: Needs assistance Sitting-balance support: Feet supported Sitting balance-Leahy Scale: Fair                                       Pertinent Vitals/Pain Pain Assessment: Faces Faces Pain Scale: No hurt    Home Living Family/patient expects to be discharged to:: Private residence Living Arrangements: Children Available Help at Discharge: Family;Available 24 hours/day Type of Home: House Home Access: Ramped entrance     Home Layout: One level Home Equipment: Walker - 2 wheels;Cane - single point;Wheelchair - Press photographer      Prior Function Level of Independence: Needs assistance   Gait / Transfers Assistance Needed: Pt reports children have to assist her with transfers. Per RN, has been taking 2 people here recently to transfer.   ADL's / Homemaking Assistance Needed: Needs assist with bathing/dressing        Hand Dominance        Extremity/Trunk Assessment   Upper Extremity Assessment Upper Extremity Assessment: Generalized weakness    Lower Extremity Assessment Lower Extremity Assessment: Generalized weakness    Cervical / Trunk Assessment Cervical / Trunk Assessment: Normal  Communication   Communication: HOH  Cognition Arousal/Alertness: Awake/alert Behavior During Therapy: Flat affect Overall Cognitive Status: No family/caregiver present to determine baseline cognitive functioning  General Comments: Pt with slowed processing when responding to questions.       General Comments General comments (skin integrity, edema, etc.): Pt requiring max encouragement to participate. Kept repeating "I can't walk, I don't have any knees." Was able to perform LAQ bilaterally X2.     Exercises     Assessment/Plan    PT Assessment Patient needs continued PT services  PT Problem List Decreased strength;Decreased balance;Decreased mobility;Decreased activity tolerance;Decreased knowledge of use of DME;Decreased knowledge of precautions       PT Treatment Interventions DME instruction;Functional mobility  training;Therapeutic activities;Therapeutic exercise;Balance training;Patient/family education    PT Goals (Current goals can be found in the Care Plan section)  Acute Rehab PT Goals Patient Stated Goal: none stated PT Goal Formulation: With patient Time For Goal Achievement: 09/07/18 Potential to Achieve Goals: Fair    Frequency Min 2X/week   Barriers to discharge        Co-evaluation               AM-PAC PT "6 Clicks" Mobility  Outcome Measure Help needed turning from your back to your side while in a flat bed without using bedrails?: A Lot Help needed moving from lying on your back to sitting on the side of a flat bed without using bedrails?: A Lot Help needed moving to and from a bed to a chair (including a wheelchair)?: Total Help needed standing up from a chair using your arms (e.g., wheelchair or bedside chair)?: Total Help needed to walk in hospital room?: Total Help needed climbing 3-5 steps with a railing? : Total 6 Click Score: 8    End of Session   Activity Tolerance: Patient tolerated treatment well Patient left: in bed;with call bell/phone within reach;with bed alarm set(sitting EOB ) Nurse Communication: Mobility status;Other (comment)(pt sitting EOB ) PT Visit Diagnosis: Difficulty in walking, not elsewhere classified (R26.2);Muscle weakness (generalized) (M62.81);Unsteadiness on feet (R26.81)    Time: 7741-4239 PT Time Calculation (min) (ACUTE ONLY): 27 min   Charges:   PT Evaluation $PT Eval Moderate Complexity: 1 Mod PT Treatments $Therapeutic Activity: 8-22 mins        Leighton Ruff, PT, DPT  Acute Rehabilitation Services  Pager: [phone] Office: [phone]   Rudean Hitt 08/24/2018, 1:24 PM

## 2018-08-24 NOTE — Progress Notes (Addendum)
[facility] Progress Note  Name: Joan Mosley MRN: [id number] DOB: [birth year]  Chief Complaint:  Tremor and AMS  Subjective:  Last HD on 7/28 with 3 kg UF.  She feels ok today.  Neurosurgery saw her this AM and they plan for CT tomorrow, 7/30.   Review of systems:  Denies shortness of breath or chest pain; reports COPD with baseline oxygen requirement  Denies n/v  ----------- Background on consult:   Joan Mosley is a 73 y.o. female with a history of ESRD on hemodialysis, COPD with a chronic 5 L oxygen requirement, and diabetes who presented to the hospital with report of lethargy and tremors.  She was found to have bilateral subdural hematomas which were felt to be subacute to chronic.  Neurosurgery was consulted and is currently observing the patient with repeat imaging planned.  Spoke with HD unit re: orders below.  Per charge RN, she signs off early and is over her dry weight.  She came on last scheduled tx 7/25 and left at 110.2 kg. Her pre-weight was 112.9.  (Under 3 kg removed).  Blood pressure will drop with treatment and so is on midodrine 10 mg prior HD.  She takes a second dose mid treatment if systolic if systolic below 95 kg and ends up taking this most if not all treatments.    HD is per TTS schedule at [facility] tunneled catheter EDW 105.5   BF 400/ DF auto 1.5 2K/2 calcium  mircera 75 mcg on 07/07/18 Calcitriol 1.5 mcg each tx and prior TIA who presented to the hospital with confusion and tremor.  She was found on imaging to have bilateral subdural hematomas which were felt to be subacute to chronic. sensipar 60 each treatment   Intake/Output Summary (Last 24 hours) at 08/24/2018 0954 Last data filed at 08/24/2018 0257 Gross per 24 hour  Intake 240 ml  Output 3000 ml  Net -2760 ml    Vitals:  Vitals:   08/24/18 0230 08/24/18 0257 08/24/18 0421 08/24/18 0749  BP: (!) 103/56 (!) 130/47 127/74 119/89  Pulse: 89 87 93 87  Resp:  17 16 20   Temp:   98.4 F (36.9 C) 98.6 F (37 C) 97.8 F (36.6 C)  TempSrc:  Oral Oral Oral  SpO2:  98% 96% 94%  Weight:  107.6 kg       Physical Exam:  General elderly female in bed in no acute distress HEENT normocephalic atraumatic Neck supple trachea midline Lungs clear to auscultation bilaterally normal work of breathing at rest  Heart S1S2; no rub Abdomen soft nontender nondistended Extremities no pitting edema  Psych normal mood and affect Access Right chest tunneled catheter   Medications reviewed   Labs:  BMP Latest Ref Rng & Units 08/23/2018 08/22/2018 08/22/2018  Glucose 70 - 99 mg/dL 98 - 107(H)  BUN 8 - 23 mg/dL 48(H) - 46(H)  Creatinine 0.44 - 1.00 mg/dL 9.72(H) - 9.29(H)  BUN/Creat Ratio 6 - 22 (calc) - - -  Sodium 135 - 145 mmol/L 133(L) 130(L) 133(L)  Potassium 3.5 - 5.1 mmol/L 5.0 4.8 5.0  Chloride 98 - 111 mmol/L 90(L) - 90(L)  CO2 22 - 32 mmol/L 27 - 27  Calcium 8.9 - 10.3 mg/dL 8.7(L) - 8.9     Assessment/Plan:   # End-stage renal disease - Continue Hemodialysis per Tuesday Thursday Saturday schedule.  No heparin with HD. - ordered renal profile daily for now   # Bilateral subdural hematomas - Plan per neurosurgery;  note they plan for CT scan on 7/30; no surgical recommendations for now - no heparin with HD  # Chronic hypoxemic respiratory failure - Secondary to COPD: Also with component of overload - Optimize volume with dialysis - Note patient on 5 L oxygen here and at home  # Hyperkalemia - Renal diet is in place; on HD as above  # Chronic hypotension - Chronic regimen of midodrine 10 mg prior to HD and another during HD; for now note 10 mg TID on TTS is ordered - follow on this regimen inpatient and then transition to prior regimen of 10 mg before and with HD on discharge (as her chair times are known outpatient).   # secondary hyperparathyroidism  - On calcitriol and sensipar as above.  resumed calcitriol three times a week.  Resumed sensipar  #  Anemia of chronic disease - Defer ESA for now  - CBC in AM  Dispo - spoke with primary team and she may be able to be discharged tomorrow if the scan is stable.  Spoke with dialysis coordinator who will try to set up a spot at [facility] for 7/30 in the event that she does go home.    Claudia Desanctis, MD 08/24/2018 9:54 AM

## 2018-08-25 ENCOUNTER — Inpatient Hospital Stay (HOSPITAL_COMMUNITY): Payer: Medicare Other

## 2018-08-25 LAB — RENAL FUNCTION PANEL
Albumin: 3.2 g/dL — ABNORMAL LOW (ref 3.5–5.0)
Anion gap: 15 (ref 5–15)
BUN: 30 mg/dL — ABNORMAL HIGH (ref 8–23)
CO2: 26 mmol/L (ref 22–32)
Calcium: 9.5 mg/dL (ref 8.9–10.3)
Chloride: 96 mmol/L — ABNORMAL LOW (ref 98–111)
Creatinine, Ser: 6.94 mg/dL — ABNORMAL HIGH (ref 0.44–1.00)
GFR calc Af Amer: 6 mL/min — ABNORMAL LOW (ref >60)
GFR calc non Af Amer: 5 mL/min — ABNORMAL LOW (ref >60)
Glucose, Bld: 108 mg/dL — ABNORMAL HIGH (ref 70–99)
Phosphorus: 6.8 mg/dL — ABNORMAL HIGH (ref 2.5–4.6)
Potassium: 4.8 mmol/L (ref 3.5–5.1)
Sodium: 137 mmol/L (ref 135–145)

## 2018-08-25 LAB — GLUCOSE, CAPILLARY
Glucose-Capillary: 108 mg/dL — ABNORMAL HIGH (ref 70–99)
Glucose-Capillary: 94 mg/dL (ref 70–99)
Glucose-Capillary: 98 mg/dL (ref 70–99)
Glucose-Capillary: 99 mg/dL (ref 70–99)

## 2018-08-25 LAB — HEPATITIS B CORE ANTIBODY, TOTAL: Hep B Core Total Ab: NEGATIVE

## 2018-08-25 LAB — CBC
HCT: 36.3 % (ref 36.0–46.0)
Hemoglobin: 11.1 g/dL — ABNORMAL LOW (ref 12.0–15.0)
MCH: 29.8 pg (ref 26.0–34.0)
MCHC: 30.6 g/dL (ref 30.0–36.0)
MCV: 97.6 fL (ref 80.0–100.0)
Platelets: 195 10*3/uL (ref 150–400)
RBC: 3.72 MIL/uL — ABNORMAL LOW (ref 3.87–5.11)
RDW: 15.8 % — ABNORMAL HIGH (ref 11.5–15.5)
WBC: 9.1 10*3/uL (ref 4.0–10.5)
nRBC: 0 % (ref 0.0–0.2)

## 2018-08-25 LAB — HEPATITIS B SURFACE ANTIBODY,QUALITATIVE: Hep B S Ab: REACTIVE

## 2018-08-25 LAB — HEPATITIS B SURFACE ANTIGEN: Hepatitis B Surface Ag: NEGATIVE

## 2018-08-25 MED ORDER — HEPARIN SODIUM (PORCINE) 1000 UNIT/ML IJ SOLN
1000.0000 [IU] | INTRAMUSCULAR | Status: DC | PRN
  Administered 2018-08-25: 15:00:00 3700 [IU] via INTRAVENOUS
  Filled 2018-08-25 (×2): qty 1

## 2018-08-25 MED ORDER — HEPARIN SODIUM (PORCINE) 1000 UNIT/ML IJ SOLN
INTRAMUSCULAR | Status: AC
  Administered 2018-08-25: 3700 [IU] via INTRAVENOUS
  Filled 2018-08-25: qty 4

## 2018-08-25 NOTE — Care Management Important Message (Signed)
Important Message  Patient Details  Name: Joan Mosley MRN: [id number] Date of Birth: 03-09-1945   Medicare Important Message Given:  Yes     [name] 08/25/2018, 12:34 PM

## 2018-08-25 NOTE — TOC Transition Note (Signed)
Transition of Care [facility]) - CM/SW Discharge Note   Patient Details  Name: Joan Mosley MRN: [id number] Date of Birth: November 01, 1945  Transition of Care [facility] CM/SW Contact:  Geralynn Ochs, LCSW Phone Number: 08/25/2018, 5:03 PM   Clinical Narrative:   Nurse please call Joan Mosley [phone]) when Joan Mosley has picked up the patient.     Final next level of care: [place] Barriers to Discharge: Barriers Resolved   Patient Goals and CMS Choice        Discharge Placement                Patient to be transferred to facility by: [place] Name of family member notified: Joan Mosley Patient and family notified of of transfer: 08/25/18  Discharge Plan and Services                DME Arranged: 3-N-1 DME Agency: [employer] Date DME Agency Contacted: 08/25/18 Time DME Agency Contacted: 36 Representative spoke with at DME Agency: [place]: RN, PT, OT [facility] Agency: [place] Date [place]: 08/25/18 Time [place]: 1703 Representative spoke with at [place]: [place] ([place]) Interventions     Readmission Risk Interventions No flowsheet data found.

## 2018-08-25 NOTE — Discharge Summary (Addendum)
Physician Discharge Summary  Joan Mosley HUT:[id number] DOB: 11-26-1945 DOA: 08/22/2018  PCP: Flossie Buffy, NP  Admit date: 08/22/2018 Discharge date: 08/25/2018  Admitted From: Home  Disposition:  Home   Recommendations for Outpatient Follow-up and new medication changes:  1. Follow up with Wilfred Lacy NP in 7 days.  2. Discontinue aspirin and clopidogrel  3. Follow CT head as outpatient in 2 weeks. 4.  Follow with Neurology as outpatient.    Home Health: Yes   Equipment/Devices: wheelchair/ home 02.    Discharge Condition: stable  CODE STATUS: full  Diet recommendation: heart healthy and diabetic prudent.   Brief/Interim Summary: 73 year old female who presented with lethargy and tremors. 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 On her initial physical examination her blood pressure 176/96, heart rate 90, temperature 99.2, respiratory rate 23, oxygen saturation 99%. She was awake and alert, her lungs were clear to auscultation bilaterally, heart S1-S2 present and rhythmic, abdomen soft, no lower extremity edema. Nonfocal. Sodium 133, potassium 5.0, chloride 90, bicarb 27, glucose 107, BUN 46, creatinine 9.29, magnesium 2.7, white count 9.8, hemoglobin 10.1, hematocrit 32.5, platelets 188.SARS COVID-19 was negative. CT with 15 mm bilateral subdural hematomas, which appear subacute to chronic. Minimal midline shift to the right. Chest radiograph, no infiltrates, right internal jugular vein hemodialysis catheter. Hyperinflation. EKG 88 bpm, normal axis, normal intervals, sinus rhythm, no ST segment changes, inverted T waves V1 through V5.  Patient was admitted to the hospital with the working diagnosis of acute metabolic encephalopathy likely related to subdural hematomas.  1.  Bilateral subdural hematomas with acute metabolic  encephalopathy.  Patient was admitted to the medical ward, she was placed on a remote telemetry monitor, had frequent neuro checks, physical therapy evaluation and neurosurgery consultation.  Aspirin and clopidogrel were discontinued.  Follow-up head CT 3 days after the initial head CT showed no significant interval change in size and appearance of bilateral mixed density subdural hematomas.  Measuring up to 16 mm in maximal diameter, associated with 5 mm left-to-right midline shift.  It was considered bilateral bur holds per neurosurgery, since these hematomas might be contributing to her weakness.  She has stated that because of advanced lung disease she woukd like to avoid surgery.  Under these circumstances the final recommendation is to continue to hold on anticoagulation, and follow-up as an outpatient.  If his symptoms were to worsen the idea of surgery might be revisited.  Physical therapy recommends skilled nursing facility but she has declined.  Patient will have home health services arranged.  2.  End-stage renal disease on hemodialysis.  Patient received hemodialysis while hospitalized per her usual schedule of Tuesday Thursday and Saturday.  She received calcitriol, Renvela and cinacalcet for her metabolic bone disease.  Continue furosemide and midodrine on dialysis days.  3.  Hypertension.  Her blood pressure remained stable without antihypertensive agents.  4.  Type 2 diabetes mellitus.  Her capillary glucose remained stable, she was treated with insulin sliding scale while hospitalized.  At home patient will resume her basal insulin therapy with Levemir 15 units daily aong with insulin sliding scale.  5.  Hypothyroidism.  Continue levothyroxine.  6.  Obesity with bilateral knee osteoarthritis and dyslipidemia.  Calculated BMI is 45.8, continue statin therapy with rosuvastatin.  Home health services have been arranged.  7.  Advanced COPD with chronic hypoxic respiratory failure.  No signs  of acute exacerbation, continue supplemental oxygen per nasal cannula.  Continue home bronchodilator therapy with albuterol and ipratropium..  8.  Anxiety.  No acute decompensation, continue twice daily as needed clonazepam.  Discharge Diagnoses:  Principal Problem:   Subdural hematoma (HCC) Active Problems:   Type 2 diabetes mellitus with diabetic polyneuropathy, with long-term current use of insulin (HCC)   Hypothyroidism   Elevated troponin   ESRD (end stage renal disease) ([place])    Discharge Instructions   Allergies as of 08/25/2018      Reactions   Avelox [moxifloxacin Hcl In Nacl]    PT does not remember   Codeine Anaphylaxis   PT does not remember   Other    PT does not remember   Penicillins    Rash   Sulfa Antibiotics    PT does nolt remember   Shellfish Allergy Hives, Itching, Rash      Medication List    STOP taking these medications   Bayer Low Dose 81 MG EC tablet Generic drug: aspirin   clopidogrel 75 MG tablet Commonly known as: PLAVIX   ondansetron 4 MG tablet Commonly known as: ZOFRAN     TAKE these medications   acetaminophen 500 MG tablet Commonly known as: TYLENOL Take 1,000 mg by mouth every 6 (six) hours as needed for mild pain.   albuterol 108 (90 Base) MCG/ACT inhaler Commonly known as: VENTOLIN HFA Inhale 2 puffs into the lungs every 6 (six) hours as needed for wheezing or shortness of breath.   calcitRIOL 0.25 MCG capsule Commonly known as: ROCALTROL Take 0.25 mcg by mouth daily.   clonazePAM 1 MG tablet Commonly known as: KLONOPIN Take 1 tablet (1 mg total) by mouth 2 (two) times daily as needed for anxiety. Change in dose   FLUoxetine 10 MG tablet Commonly known as: PROZAC Take 1 tablet (10 mg total) by mouth daily.   furosemide 80 MG tablet Commonly known as: LASIX Take 80 mg by mouth See admin instructions. Sunday, Monday,Wed,Friday--in AM   insulin detemir 100 UNIT/ML injection Commonly known as: LEVEMIR Inject 0.15  mLs (15 Units total) into the skin daily.   insulin lispro 100 UNIT/ML injection Commonly known as: HUMALOG Inject 1-5 Units into the skin 3 (three) times daily with meals as needed for high blood sugar. Sliding scale   ipratropium 0.02 % nebulizer solution Commonly known as: ATROVENT Inhale 0.5 mg into the lungs 4 (four) times daily.   levothyroxine 112 MCG tablet Commonly known as: SYNTHROID Take 1 tablet (112 mcg total) by mouth daily before breakfast. 1 AM   lidocaine 5 % Commonly known as: LIDODERM Place 1 patch onto the skin every 12 (twelve) hours as needed (back pain). Remove & Discard patch within 12 hours or as directed by MD   midodrine 10 MG tablet Commonly known as: PROAMATINE Take 10 mg by mouth 3 (three) times daily. Taking on Dialysis days, Tues, Thur, Sat.   montelukast 10 MG tablet Commonly known as: SINGULAIR Take 10 mg by mouth at bedtime. PM   nystatin powder Commonly known as: MYCOSTATIN/NYSTOP Apply 1 g topically 2 (two) times daily as needed (skin rash).   nystatin cream Commonly known as: MYCOSTATIN Apply 1 application topically 2 (two) times daily as needed for dry skin.   rosuvastatin 40 MG tablet Commonly known as: CRESTOR Take 40 mg by mouth daily. AM   senna-docusate 8.6-50 MG tablet Commonly known as: Senna Plus Take 1 tablet by mouth at bedtime.   sevelamer  800 MG tablet Commonly known as: RENAGEL Take 1,600 mg by mouth 3 (three) times daily with meals. 1600 mg --3 times daily--take additional 1-2 if eats snack       Allergies  Allergen Reactions  . Avelox [Moxifloxacin Hcl In Nacl]     PT does not remember  . Codeine Anaphylaxis    PT does not remember  . Other     PT does not remember  . Penicillins     Rash  . Sulfa Antibiotics     PT does nolt remember  . Shellfish Allergy Hives, Itching and Rash    Consultations:  Neurosurgery   Nephrology    Procedures/Studies: Dg Chest 2 View  Result Date:  08/22/2018 CLINICAL DATA:  Weakness, altered level of consciousness EXAM: CHEST - 2 VIEW COMPARISON:  08/02/2018 FINDINGS: Lungs are clear.  No pleural effusion or pneumothorax. Cardiomegaly. Right IJ dual lumen dialysis catheter terminates in the upper right atrium. Mild degenerative changes of the visualized thoracolumbar spine. IMPRESSION: Normal chest radiographs. Electronically Signed   By: Julian Hy M.D.   On: 08/22/2018 19:52   Dg Chest 2 View  Result Date: 08/01/2018 CLINICAL DATA:  Shortness of breath. EXAM: CHEST - 2 VIEW COMPARISON:  05/18/2018 and 04/19/2018 FINDINGS: Heart size is normal. Pulmonary vascular congestion at the bases posteriorly best seen on the lateral view. Central venous dialysis catheter in place, unchanged. No acute bone abnormality. IMPRESSION: Pulmonary vascular congestion at the lung bases. Electronically Signed   By: [name] M.D.   On: 08/01/2018 05:35   Ct Head Wo Contrast  Result Date: 08/25/2018 CLINICAL DATA:  Follow-up examination for subdural hemorrhage. EXAM: CT HEAD WITHOUT CONTRAST TECHNIQUE: Contiguous axial images were obtained from the base of the skull through the vertex without intravenous contrast. COMPARISON:  Prior CT from 08/22/2018 FINDINGS: Brain: Bilateral extra-axial mixed density subdural hematomas again seen, acute-subacute on chronic in appearance. Left-sided collection measures up to 16 mm in maximal thickness at the left frontal convexity. Right collection measures up to 16 mm as well. Overall, appearance is not significantly changed from previous. Secondary mild mass effect on the subjacent cerebral hemispheres with persistent 5 mm left-to-right shift, little interval changed. No hydrocephalus or ventricular trapping. Basilar cisterns remain patent. No new intracranial hemorrhage. No acute large vessel territory infarct. Atrophy with chronic microvascular ischemic disease noted. Vascular: Calcified atherosclerosis noted at the  skull base. No hyperdense vessel. Skull: Scalp soft tissues and calvarium within normal limits. Sinuses/Orbits: Globes and orbital soft tissues are normal. Chronic right-sided paranasal sinusitis noted. Trace bilateral mastoid effusions noted. Other: None. IMPRESSION: 1. No significant interval change in size and appearance of bilateral mixed density subdural hematomas, measuring up to 16 mm in maximal diameter on current exam. Associated 5 mm left-to-right midline shift also not significantly changed. 2. No other new acute intracranial abnormality. 3. Atrophy with chronic microvascular ischemic disease. 4. Chronic right-sided paranasal sinusitis. Electronically Signed   By: Jeannine Boga M.D.   On: 08/25/2018 04:06   Ct Head Wo Contrast  Result Date: 08/22/2018 CLINICAL DATA:  Altered level of consciousness. EXAM: CT HEAD WITHOUT CONTRAST TECHNIQUE: Contiguous axial images were obtained from the base of the skull through the vertex without intravenous contrast. COMPARISON:  None. FINDINGS: Brain: Bilateral extra-axial fluid collections compatible with subdural hematomas that appears subacute to chronic. Majority of the fluid is low-density. There are small areas of higher density. Multiple septations are seen within the fluid. Right-sided collection measures approximately 14 mm  and left sided collection measures 15 mm. Minimal midline shift to the right. Negative for hydrocephalus. No acute infarct. Chronic ischemic change in the white matter. Vascular: Negative for hyperdense vessel Skull: Negative Sinuses/Orbits: Mucosal edema paranasal sinuses most notably right frontal ethmoid and maxillary sinuses. Bony thickening of the maxillary sinus bilaterally Other: None IMPRESSION: 15 mm bilateral subdural hematomas which appears subacute to chronic. Majority of the fluid is low-density without evidence of acute bleeding. Minimal midline shift to the right. These results were called by telephone at the time  of interpretation on 08/22/2018 at 9:18 pm to Dr. Isla Pence , who verbally acknowledged these results. Electronically Signed   By: Franchot Gallo M.D.   On: 08/22/2018 21:19   Dg Chest Port 1 View  Result Date: 08/02/2018 CLINICAL DATA:  Weakness. EXAM: PORTABLE CHEST 1 VIEW COMPARISON:  08/01/2018 FINDINGS: The patient is again rotated to the right. A right jugular dialysis catheter is unchanged. The cardiomediastinal silhouette is unchanged pulmonary vascular congestion is similar to the prior study. No overt edema, airspace consolidation, sizeable pleural effusion, or pneumothorax is identified. No acute osseous abnormality is seen. IMPRESSION: Unchanged pulmonary vascular congestion. Electronically Signed   By: Logan Bores M.D.   On: 08/02/2018 17:05      Procedures:   Subjective: Patient is feeling better, no nausea or vomiting, no headache, continue to e very weak and deconditioned. She has declined SNF.    Discharge Exam: Vitals:   08/25/18 0437 08/25/18 0803  BP: (!) 131/91 130/81  Pulse: 78 90  Resp: 19 18  Temp: 97.9 F (36.6 C) 98.3 F (36.8 C)  SpO2: 94% 91%   Vitals:   08/24/18 2344 08/25/18 0437 08/25/18 0437 08/25/18 0803  BP: (!) 156/76  (!) 131/91 130/81  Pulse: 93  78 90  Resp: 18  19 18   Temp: 98.6 F (37 C)  97.9 F (36.6 C) 98.3 F (36.8 C)  TempSrc: Oral  Oral Oral  SpO2: 92%  94% 91%  Weight:  109.7 kg      General: Not in pain or dyspnea.  Neurology: Awake and alert, non focal/ generalized weakness.   E ENT: mild pallor, no icterus, oral mucosa moist Cardiovascular: No JVD. S1-S2 present, rhythmic, no gallops, rubs, or murmurs. No lower extremity edema. Pulmonary: positive breath sounds bilaterally, positive air traping, but no wheezing, rhonchi or rales. Gastrointestinal. Abdomen with, no organomegaly, non tender, no rebound or guarding Skin. No rashes Musculoskeletal: no joint deformities   The results of significant diagnostics from this  hospitalization (including imaging, microbiology, ancillary and laboratory) are listed below for reference.     Microbiology: Recent Results (from the past 240 hour(s))  SARS Coronavirus 2 (CEPHEID - Performed in [place] hospital lab), Hosp Order     Status: None   Collection Time: 08/22/18 10:49 PM   Specimen: Nasopharyngeal Swab  Result Value Ref Range Status   SARS Coronavirus 2 NEGATIVE NEGATIVE Final    Comment: (NOTE) If result is NEGATIVE SARS-CoV-2 target nucleic acids are NOT DETECTED. The SARS-CoV-2 RNA is generally detectable in upper and lower  respiratory specimens during the acute phase of infection. The lowest  concentration of SARS-CoV-2 viral copies this assay can detect is 250  copies / mL. A negative result does not preclude SARS-CoV-2 infection  and should not be used as the sole basis for treatment or other  patient management decisions.  A negative result may occur with  improper specimen collection / handling, submission of  specimen other  than nasopharyngeal swab, presence of viral mutation(s) within the  areas targeted by this assay, and inadequate number of viral copies  (<250 copies / mL). A negative result must be combined with clinical  observations, patient history, and epidemiological information. If result is POSITIVE SARS-CoV-2 target nucleic acids are DETECTED. The SARS-CoV-2 RNA is generally detectable in upper and lower  respiratory specimens dur ing the acute phase of infection.  Positive  results are indicative of active infection with SARS-CoV-2.  Clinical  correlation with patient history and other diagnostic information is  necessary to determine patient infection status.  Positive results do  not rule out bacterial infection or co-infection with other viruses. If result is PRESUMPTIVE POSTIVE SARS-CoV-2 nucleic acids MAY BE PRESENT.   A presumptive positive result was obtained on the submitted specimen  and confirmed on repeat testing.   While 2019 novel coronavirus  (SARS-CoV-2) nucleic acids may be present in the submitted sample  additional confirmatory testing may be necessary for epidemiological  and / or clinical management purposes  to differentiate between  SARS-CoV-2 and other Sarbecovirus currently known to infect humans.  If clinically indicated additional testing with an alternate test  methodology [phone]) is advised. The SARS-CoV-2 RNA is generally  detectable in upper and lower respiratory sp ecimens during the acute  phase of infection. The expected result is Negative. Fact Sheet for Patients:  StrictlyIdeas.no Fact Sheet for Healthcare Providers: BankingDealers.co.za This test is not yet approved or cleared by the [place] FDA and has been authorized for detection and/or diagnosis of SARS-CoV-2 by FDA under an Emergency Use Authorization (EUA).  This EUA will remain in effect (meaning this test can be used) for the duration of the COVID-19 declaration under Section 564(b)(1) of the Act, 21 U.S.C. section 360bbb-3(b)(1), unless the authorization is terminated or revoked sooner. Performed at [facility], [place] [address] [place], [place] [address]      Labs: BNP (last 3 results) No results for input(s): BNP in the last 8760 hours. Basic Metabolic Panel: Recent Labs  Lab 08/22/18 1924 08/22/18 2015 08/23/18 0210 08/25/18 0358  NA 133* 130* 133* 137  K 5.0 4.8 5.0 4.8  CL 90*  --  90* 96*  CO2 27  --  27 26  GLUCOSE 107*  --  98 108*  BUN 46*  --  48* 30*  CREATININE 9.29*  --  9.72* 6.94*  CALCIUM 8.9  --  8.7* 9.5  MG 2.7*  --   --   --   PHOS  --   --   --  6.8*   Liver Function Tests: Recent Labs  Lab 08/22/18 1924 08/23/18 0210 08/25/18 0358  AST 10* 8*  --   ALT 10 8  --   ALKPHOS 72 67  --   BILITOT 0.9 1.0  --   PROT 6.7 6.3*  --   ALBUMIN 3.3* 3.0* 3.2*   No results for input(s): LIPASE, AMYLASE in the last 168  hours. No results for input(s): AMMONIA in the last 168 hours. CBC: Recent Labs  Lab 08/22/18 1924 08/22/18 2015 08/23/18 0210 08/25/18 0358  WBC 9.8  --  10.4 9.1  NEUTROABS 6.7  --   --   --   HGB 10.1* 9.5* 9.9* 11.1*  HCT 32.5* 28.0* 31.6* 36.3  MCV 96.4  --  95.5 97.6  PLT 188  --  169 195   Cardiac Enzymes: No results for input(s): CKTOTAL, CKMB, CKMBINDEX,  TROPONINI in the last 168 hours. BNP: Invalid input(s): POCBNP CBG: Recent Labs  Lab 08/24/18 1658 08/24/18 2012 08/24/18 2349 08/25/18 0440 08/25/18 0801  GLUCAP 80 108* 99 108* 94   D-Dimer No results for input(s): DDIMER in the last 72 hours. Hgb A1c No results for input(s): HGBA1C in the last 72 hours. Lipid Profile No results for input(s): CHOL, HDL, LDLCALC, TRIG, CHOLHDL, LDLDIRECT in the last 72 hours. Thyroid function studies No results for input(s): TSH, T4TOTAL, T3FREE, THYROIDAB in the last 72 hours.  Invalid input(s): FREET3 Anemia work up No results for input(s): VITAMINB12, FOLATE, FERRITIN, TIBC, IRON, RETICCTPCT in the last 72 hours. Urinalysis    Component Value Date/Time   COLORURINE YELLOW 04/20/2018 0301   APPEARANCEUR HAZY (A) 04/20/2018 0301   LABSPEC 1.009 04/20/2018 0301   PHURINE 9.0 (H) 04/20/2018 0301   GLUCOSEU 150 (A) 04/20/2018 0301   HGBUR MODERATE (A) 04/20/2018 0301   BILIRUBINUR NEGATIVE 04/20/2018 0301   KETONESUR NEGATIVE 04/20/2018 0301   PROTEINUR 100 (A) 04/20/2018 0301   NITRITE NEGATIVE 04/20/2018 0301   LEUKOCYTESUR NEGATIVE 04/20/2018 0301   Sepsis Labs Invalid input(s): PROCALCITONIN,  WBC,  LACTICIDVEN Microbiology Recent Results (from the past 240 hour(s))  SARS Coronavirus 2 (CEPHEID - Performed in [place] hospital lab), Hosp Order     Status: None   Collection Time: 08/22/18 10:49 PM   Specimen: Nasopharyngeal Swab  Result Value Ref Range Status   SARS Coronavirus 2 NEGATIVE NEGATIVE Final    Comment: (NOTE) If result is  NEGATIVE SARS-CoV-2 target nucleic acids are NOT DETECTED. The SARS-CoV-2 RNA is generally detectable in upper and lower  respiratory specimens during the acute phase of infection. The lowest  concentration of SARS-CoV-2 viral copies this assay can detect is 250  copies / mL. A negative result does not preclude SARS-CoV-2 infection  and should not be used as the sole basis for treatment or other  patient management decisions.  A negative result may occur with  improper specimen collection / handling, submission of specimen other  than nasopharyngeal swab, presence of viral mutation(s) within the  areas targeted by this assay, and inadequate number of viral copies  (<250 copies / mL). A negative result must be combined with clinical  observations, patient history, and epidemiological information. If result is POSITIVE SARS-CoV-2 target nucleic acids are DETECTED. The SARS-CoV-2 RNA is generally detectable in upper and lower  respiratory specimens dur ing the acute phase of infection.  Positive  results are indicative of active infection with SARS-CoV-2.  Clinical  correlation with patient history and other diagnostic information is  necessary to determine patient infection status.  Positive results do  not rule out bacterial infection or co-infection with other viruses. If result is PRESUMPTIVE POSTIVE SARS-CoV-2 nucleic acids MAY BE PRESENT.   A presumptive positive result was obtained on the submitted specimen  and confirmed on repeat testing.  While 2019 novel coronavirus  (SARS-CoV-2) nucleic acids may be present in the submitted sample  additional confirmatory testing may be necessary for epidemiological  and / or clinical management purposes  to differentiate between  SARS-CoV-2 and other Sarbecovirus currently known to infect humans.  If clinically indicated additional testing with an alternate test  methodology [phone]) is advised. The SARS-CoV-2 RNA is generally  detectable  in upper and lower respiratory sp ecimens during the acute  phase of infection. The expected result is Negative. Fact Sheet for Patients:  StrictlyIdeas.no Fact Sheet for Healthcare Providers: BankingDealers.co.za This test  is not yet approved or cleared by the [place] and has been authorized for detection and/or diagnosis of SARS-CoV-2 by FDA under an Emergency Use Authorization (EUA).  This EUA will remain in effect (meaning this test can be used) for the duration of the COVID-19 declaration under Section 564(b)(1) of the Act, 21 U.S.C. section 360bbb-3(b)(1), unless the authorization is terminated or revoked sooner. Performed at [facility], [place] [address], [place], [place] [address]      Time coordinating discharge: 45 minutes  SIGNED:   Tawni Millers, MD  [facility] 08/25/2018, 9:39 AM

## 2018-08-25 NOTE — Progress Notes (Signed)
Pt requires a bariatric 3 in 1 due to her body habitus and weakness in her lower extremities. She is not able to use a regular 3 in 1 safely.

## 2018-08-25 NOTE — Progress Notes (Signed)
Subjective: The patient is alert and pleasant.  She has no complaints other than she feels generally weak.  She does not feel like she is able to ambulate because of this weakness.  She denies headaches.  She reiterates to me that "I cannot have surgery".  Objective: Vital signs in last 24 hours: Temp:  [97.6 F (36.4 C)-98.6 F (37 C)] 98.3 F (36.8 C) (07/30 0803) Pulse Rate:  [78-100] 90 (07/30 0803) Resp:  [18-24] 18 (07/30 0803) BP: (107-156)/(66-91) 130/81 (07/30 0803) SpO2:  [90 %-98 %] 91 % (07/30 0803) Weight:  [109.7 kg] 109.7 kg (07/30 0437) Estimated body mass index is 45.7 kg/m as calculated from the following:   Height as of 08/08/18: 5\' 1"  (1.549 m).   Weight as of this encounter: 109.7 kg.   Intake/Output from previous day: No intake/output data recorded. Intake/Output this shift: No intake/output data recorded.  Physical exam the patient is alert and oriented x3.  Her strength is grossly normal in her bilateral gastrocnemius and dorsiflexors.  I have reviewed that her follow-up head CT performed this morning.  It demonstrates moderate bilateral subdural hematomas with some mass-effect on the bilateral frontal lobes.  There is no significant change from her previous CT.  Lab Results: Recent Labs    08/23/18 0210 08/25/18 0358  WBC 10.4 9.1  HGB 9.9* 11.1*  HCT 31.6* 36.3  PLT 169 195   BMET Recent Labs    08/23/18 0210 08/25/18 0358  NA 133* 137  K 5.0 4.8  CL 90* 96*  CO2 27 26  GLUCOSE 98 108*  BUN 48* 30*  CREATININE 9.72* 6.94*  CALCIUM 8.7* 9.5    Studies/Results: Ct Head Wo Contrast  Result Date: 08/25/2018 CLINICAL DATA:  Follow-up examination for subdural hemorrhage. EXAM: CT HEAD WITHOUT CONTRAST TECHNIQUE: Contiguous axial images were obtained from the base of the skull through the vertex without intravenous contrast. COMPARISON:  Prior CT from 08/22/2018 FINDINGS: Brain: Bilateral extra-axial mixed density subdural hematomas again  seen, acute-subacute on chronic in appearance. Left-sided collection measures up to 16 mm in maximal thickness at the left frontal convexity. Right collection measures up to 16 mm as well. Overall, appearance is not significantly changed from previous. Secondary mild mass effect on the subjacent cerebral hemispheres with persistent 5 mm left-to-right shift, little interval changed. No hydrocephalus or ventricular trapping. Basilar cisterns remain patent. No new intracranial hemorrhage. No acute large vessel territory infarct. Atrophy with chronic microvascular ischemic disease noted. Vascular: Calcified atherosclerosis noted at the skull base. No hyperdense vessel. Skull: Scalp soft tissues and calvarium within normal limits. Sinuses/Orbits: Globes and orbital soft tissues are normal. Chronic right-sided paranasal sinusitis noted. Trace bilateral mastoid effusions noted. Other: None. IMPRESSION: 1. No significant interval change in size and appearance of bilateral mixed density subdural hematomas, measuring up to 16 mm in maximal diameter on current exam. Associated 5 mm left-to-right midline shift also not significantly changed. 2. No other new acute intracranial abnormality. 3. Atrophy with chronic microvascular ischemic disease. 4. Chronic right-sided paranasal sinusitis. Electronically Signed   By: Jeannine Boga M.D.   On: 08/25/2018 04:06    Assessment/Plan: Bilateral subdural hematomas: I have again discussed the situation with the patient.  I have explained that these hematomas could be contributing to her weakness, although her general condition is not helping either.  We discussed bilateral bur holes but as above she told me "I cannot have surgery".  Under the circumstances I would recommend holding her anticoagulation and hopefully  they will resolve without intervention.  If she were to worsen then perhaps she would consider surgery.  LOS: 3 days     Ophelia Charter 08/25/2018, 8:51  AM

## 2018-08-25 NOTE — TOC Initial Note (Signed)
Transition of Care [facility]) - Initial/Assessment Note    Patient Details  Name: Joan Mosley MRN: [id number] Date of Birth: 09/07/1945  [facility]) CM/SW Contact:    Geralynn Ochs, LCSW Phone Number: 08/25/2018, 9:01 PM  Clinical Narrative:  CSW spoke with patient's daughter, [name], and patient's son, [name], to discuss patient's transition from the hospital. CSW discussed recommendation for SNF, and daughter and son agreed that they're already providing max assist for the patient. They are bathing her and she doesn't move out of her chair much. They are not interested in SNF at this time. Would prefer home health set up instead.                 Expected Discharge Plan: [place] Barriers to Discharge: Continued Medical Work up   Patient Goals and CMS Choice   CMS Medicare.gov Compare Post Acute Care list provided to:: Patient Represenative (must comment) Choice offered to / list presented to : Adult Children  Expected Discharge Plan and Services Expected Discharge Plan: [place] Choice: Home Health, Durable Medical Equipment Living arrangements for the past 2 months: Mobile Home Expected Discharge Date: 08/25/18               DME Arranged: 3-N-1 DME Agency: [employer] Date DME Agency Contacted: 08/25/18 Time DME Agency Contacted: [phone] Representative spoke with at DME Agency: [place] Arranged: RN, PT, OT [place] Agency: [employer] [place] Date [place]: 08/25/18 Time [place]: 1703 Representative spoke with at [place]: [place] Arrangements/Services Living arrangements for the past 2 months: Mobile Home Lives with:: Adult Children Patient language and need for interpreter reviewed:: No Do you feel safe going back to the place where you live?: Yes      Need for Family Participation in Patient Care: Yes (Comment) Care giver support system in place?: Yes  (comment) Current home services: DME Criminal Activity/Legal Involvement Pertinent to Current Situation/Hospitalization: No - Comment as needed  Activities of Daily Living      Permission Sought/Granted Permission sought to share information with : Family Supports Permission granted to share information with : Yes, Verbal Permission Granted  Share Information with NAME: [name]     Permission granted to share info w Relationship: Daughter     Emotional Assessment       Orientation: : Oriented to Self Alcohol / Substance Use: Not Applicable Psych Involvement: No (comment)  Admission diagnosis:  Subdural hematoma ([place]) [S06.5X9A] ESRD on hemodialysis ([place]) [N18.6, Z99.2] Patient Active Problem List   Diagnosis Date Noted  . Subdural hematoma ([place]) 08/22/2018  . Elevated troponin 08/22/2018  . ESRD (end stage renal disease) ([place]) 08/22/2018  . Anxiety 07/04/2018  . Counseling regarding advanced directives and goals of care 07/01/2018  . Full code status 07/01/2018  . Drug-induced constipation 07/01/2018  . Hemodialysis-associated hypotension 07/01/2018  . Dialysis patient ([place]) 07/01/2018  . HOH (hard of hearing) 07/01/2018  . Stage 5 chronic kidney disease on chronic dialysis ([place]) 07/01/2018  . Type 2 diabetes mellitus with diabetic polyneuropathy, with long-term current use of insulin ([place]) 07/01/2018  . Hypothyroidism 07/01/2018  . Gait instability 07/01/2018  . Hyperkalemia 04/20/2018   PCP:  Flossie Buffy, NP Pharmacy:   [facility] [place], [place] - [address] [place] [address] Phone: [phone] Fax: [phone]  Social Determinants of Health (SDOH) Interventions    Readmission Risk Interventions No flowsheet data found.

## 2018-08-25 NOTE — Plan of Care (Signed)
Plan of care adequate for discharge.

## 2018-08-25 NOTE — Progress Notes (Signed)
Patient being discharged home.  Patient to be transported by [facility].  IV removed with the catheter intact.  Discharge instructions and prescription information given to the patient who verbalized understanding.

## 2018-08-25 NOTE — Plan of Care (Signed)
Progressing towards goals

## 2018-08-25 NOTE — Progress Notes (Signed)
[facility] Progress Note  Name: Joan Mosley MRN: [id number] DOB: [birth year]  Chief Complaint:  Tremor and AMS  Subjective:  Last HD on 7/28 with 3 kg UF.  Had a CT head this AM with no interval change of bilateral subdural hematomas with associated left-to-right midline shift also unchanged.  Spoke with her primary team.   Neurosurgery is to reassess the patient.  Her attending states her family would like her to go to a SNF but patient is refusing.  She may be able to go home today but is unlikely to make a chair time outpatient today if discharged.   Review of systems:   Denies overt shortness of breath or chest pain; reports COPD with baseline oxygen requirement and her breathing is at her baseline Denies n/v  ----------- Background on consult:   Joan Mosley is a 73 y.o. female with a history of ESRD on hemodialysis, COPD with a chronic 5 L oxygen requirement, and diabetes who presented to the hospital with report of lethargy and tremors.  She was found to have bilateral subdural hematomas which were felt to be subacute to chronic.  Neurosurgery was consulted and is currently observing the patient with repeat imaging planned.  Spoke with HD unit re: orders below.  Per charge RN, she signs off early and is over her dry weight.  She came on last scheduled tx 7/25 and left at 110.2 kg. Her pre-weight was 112.9.  (Under 3 kg removed).  Blood pressure will drop with treatment and so is on midodrine 10 mg prior HD.  She takes a second dose mid treatment if systolic if systolic below 95 kg and ends up taking this most if not all treatments.    HD is per TTS schedule at [facility] tunneled catheter EDW 105.5   BF 400/ DF auto 1.5 2K/2 calcium  mircera 75 mcg on 07/07/18 Calcitriol 1.5 mcg each tx and prior TIA who presented to the hospital with confusion and tremor.  She was found on imaging to have bilateral subdural hematomas which were felt to be subacute to  chronic. sensipar 60 each treatment  No intake or output data in the 24 hours ending 08/25/18 0852  Vitals:  Vitals:   08/24/18 2344 08/25/18 0437 08/25/18 0437 08/25/18 0803  BP: (!) 156/76  (!) 131/91 130/81  Pulse: 93  78 90  Resp: 18  19 18   Temp: 98.6 F (37 C)  97.9 F (36.6 C) 98.3 F (36.8 C)  TempSrc: Oral  Oral Oral  SpO2: 92%  94% 91%  Weight:  109.7 kg       Physical Exam:  General elderly female in bed in no acute distress  HEENT normocephalic atraumatic Neck supple trachea midline Lungs clear anteriorly and unlabored.  On oxygen  Heart S1S2; no rub Abdomen soft nontender nondistended Extremities no pitting edema  Psych normal mood and affect Access Right chest tunneled catheter   Medications reviewed   Labs:  BMP Latest Ref Rng & Units 08/25/2018 08/23/2018 08/22/2018  Glucose 70 - 99 mg/dL 108(H) 98 -  BUN 8 - 23 mg/dL 30(H) 48(H) -  Creatinine 0.44 - 1.00 mg/dL 6.94(H) 9.72(H) -  BUN/Creat Ratio 6 - 22 (calc) - - -  Sodium 135 - 145 mmol/L 137 133(L) 130(L)  Potassium 3.5 - 5.1 mmol/L 4.8 5.0 4.8  Chloride 98 - 111 mmol/L 96(L) 90(L) -  CO2 22 - 32 mmol/L 26 27 -  Calcium 8.9 - 10.3 mg/dL 9.5 8.7(L) -  Assessment/Plan:   # End-stage renal disease - Continue Hemodialysis per Tuesday Thursday Saturday schedule.  Will dialyze here 7/30.  No heparin with HD with bilateral subdural hematomas.  # Bilateral subdural hematomas - Plan per neurosurgery; no surgical recommendations thus far - no heparin with HD  # Chronic hypoxemic respiratory failure - Secondary to COPD: Also with component of overload - Optimize volume with dialysis - Note patient on 5 L oxygen at home  # Hyperkalemia - Renal diet is in place; on HD as above  # Chronic hypotension - Chronic regimen of midodrine 10 mg prior to HD and another during HD; for now note 10 mg TID on TTS is ordered - follow on this regimen inpatient and then transition to prior regimen of 10 mg  before and with HD on discharge (as her chair times are known outpatient).   # secondary hyperparathyroidism  - On calcitriol and sensipar as above.  resumed calcitriol three times a week.  Resumed sensipar  # Anemia of chronic disease - Defer ESA for now - Hb stable.    Claudia Desanctis, MD 08/25/2018 8:52 AM

## 2018-08-26 ENCOUNTER — Telehealth: Payer: Self-pay | Admitting: Behavioral Health

## 2018-08-26 NOTE — Telephone Encounter (Signed)
Transition Care Management Follow-up Telephone Call  PCP: Flossie Buffy, NP  Admit date: 08/22/2018 Discharge date: 08/25/2018  Admitted From: Home  Disposition:  Home   Recommendations for Outpatient Follow-up and new medication changes:  1. Follow up with Wilfred Lacy NP in 7 days.  2. Discontinue aspirin and clopidogrel  3. Follow CT head as outpatient in 2 weeks. 4.  Follow with Neurology as outpatient.    Home Health: Yes   Equipment/Devices: wheelchair/ home 02.    Discharge Condition: stable    How have you been since you were released from the hospital? Per patient's son, "She's doing a lot better".   Do you understand why you were in the hospital? yes, per the patient's son.   Do you understand the discharge instructions? yes   Where were you discharged to? Home   Items Reviewed:  Medications reviewed: yes  Allergies reviewed: yes  Dietary changes reviewed: yes, consuming low carb diet.  Referrals reviewed: yes, Follow up with Wilfred Lacy NP in 7 days.    Functional Questionnaire:   Activities of Daily Living (ADLs):   She states they are independent in the following: feeding, continence and toileting States they require assistance with the following: ambulation, bathing and hygiene, grooming and dressing   Any transportation issues/concerns?: no   Any patient concerns? no   Confirmed importance and date/time of follow-up visits scheduled yes, 09/02/2018 at 11:00 AM.  Provider Appointment booked with Wilfred Lacy, NP.  Confirmed with patient if condition begins to worsen call PCP or go to the ER.  Patient was given the office number and encouraged to call back with question or concerns.  : yes

## 2018-08-30 ENCOUNTER — Telehealth: Payer: Self-pay | Admitting: Nurse Practitioner

## 2018-08-30 NOTE — Telephone Encounter (Signed)
error 

## 2018-08-31 ENCOUNTER — Telehealth: Payer: Self-pay | Admitting: Nurse Practitioner

## 2018-08-31 NOTE — Telephone Encounter (Signed)
Don - OT with [facility] calling in    1 week 2 for therapeutic exercise.   CB: [id number]

## 2018-09-01 NOTE — Telephone Encounter (Signed)
Please advise, ok for order?

## 2018-09-01 NOTE — Telephone Encounter (Signed)
Don is aware.

## 2018-09-01 NOTE — Telephone Encounter (Signed)
ok 

## 2018-09-02 ENCOUNTER — Inpatient Hospital Stay: Payer: Medicare Other | Admitting: Nurse Practitioner

## 2018-09-06 ENCOUNTER — Encounter (HOSPITAL_COMMUNITY): Payer: Self-pay | Admitting: Emergency Medicine

## 2018-09-06 ENCOUNTER — Observation Stay (HOSPITAL_BASED_OUTPATIENT_CLINIC_OR_DEPARTMENT_OTHER)
Admission: EM | Admit: 2018-09-06 | Discharge: 2018-09-07 | Disposition: A | Discharge status: Home / Self Care | Payer: Medicare Other | Source: Home / Self Care | Attending: Emergency Medicine | Admitting: Emergency Medicine

## 2018-09-06 ENCOUNTER — Emergency Department (HOSPITAL_COMMUNITY): Payer: Medicare Other

## 2018-09-06 ENCOUNTER — Other Ambulatory Visit: Payer: Self-pay

## 2018-09-06 DIAGNOSIS — I953 Hypotension of hemodialysis: Secondary | ICD-10-CM | POA: Insufficient documentation

## 2018-09-06 DIAGNOSIS — J9611 Chronic respiratory failure with hypoxia: Secondary | ICD-10-CM | POA: Insufficient documentation

## 2018-09-06 DIAGNOSIS — S065X9A Traumatic subdural hemorrhage with loss of consciousness of unspecified duration, initial encounter: Secondary | ICD-10-CM | POA: Diagnosis present

## 2018-09-06 DIAGNOSIS — Y999 Unspecified external cause status: Secondary | ICD-10-CM | POA: Insufficient documentation

## 2018-09-06 DIAGNOSIS — E039 Hypothyroidism, unspecified: Secondary | ICD-10-CM | POA: Insufficient documentation

## 2018-09-06 DIAGNOSIS — Z9981 Dependence on supplemental oxygen: Secondary | ICD-10-CM

## 2018-09-06 DIAGNOSIS — R2681 Unsteadiness on feet: Secondary | ICD-10-CM | POA: Diagnosis present

## 2018-09-06 DIAGNOSIS — N186 End stage renal disease: Secondary | ICD-10-CM | POA: Diagnosis present

## 2018-09-06 DIAGNOSIS — R531 Weakness: Secondary | ICD-10-CM | POA: Diagnosis not present

## 2018-09-06 DIAGNOSIS — Z794 Long term (current) use of insulin: Secondary | ICD-10-CM | POA: Insufficient documentation

## 2018-09-06 DIAGNOSIS — Z7189 Other specified counseling: Secondary | ICD-10-CM

## 2018-09-06 DIAGNOSIS — Z96653 Presence of artificial knee joint, bilateral: Secondary | ICD-10-CM | POA: Insufficient documentation

## 2018-09-06 DIAGNOSIS — S065XAA Traumatic subdural hemorrhage with loss of consciousness status unknown, initial encounter: Secondary | ICD-10-CM

## 2018-09-06 DIAGNOSIS — R2689 Other abnormalities of gait and mobility: Secondary | ICD-10-CM | POA: Insufficient documentation

## 2018-09-06 DIAGNOSIS — Z79899 Other long term (current) drug therapy: Secondary | ICD-10-CM | POA: Insufficient documentation

## 2018-09-06 DIAGNOSIS — E1122 Type 2 diabetes mellitus with diabetic chronic kidney disease: Secondary | ICD-10-CM | POA: Insufficient documentation

## 2018-09-06 DIAGNOSIS — Y929 Unspecified place or not applicable: Secondary | ICD-10-CM | POA: Insufficient documentation

## 2018-09-06 DIAGNOSIS — Z992 Dependence on renal dialysis: Secondary | ICD-10-CM | POA: Insufficient documentation

## 2018-09-06 DIAGNOSIS — W1839XA Other fall on same level, initial encounter: Secondary | ICD-10-CM | POA: Insufficient documentation

## 2018-09-06 DIAGNOSIS — E1142 Type 2 diabetes mellitus with diabetic polyneuropathy: Secondary | ICD-10-CM

## 2018-09-06 DIAGNOSIS — I120 Hypertensive chronic kidney disease with stage 5 chronic kidney disease or end stage renal disease: Secondary | ICD-10-CM | POA: Insufficient documentation

## 2018-09-06 DIAGNOSIS — Y9389 Activity, other specified: Secondary | ICD-10-CM | POA: Insufficient documentation

## 2018-09-06 DIAGNOSIS — Z20828 Contact with and (suspected) exposure to other viral communicable diseases: Secondary | ICD-10-CM | POA: Insufficient documentation

## 2018-09-06 DIAGNOSIS — Z87891 Personal history of nicotine dependence: Secondary | ICD-10-CM | POA: Insufficient documentation

## 2018-09-06 DIAGNOSIS — J441 Chronic obstructive pulmonary disease with (acute) exacerbation: Secondary | ICD-10-CM | POA: Diagnosis not present

## 2018-09-06 DIAGNOSIS — S065X0A Traumatic subdural hemorrhage without loss of consciousness, initial encounter: Secondary | ICD-10-CM | POA: Insufficient documentation

## 2018-09-06 LAB — CBC WITH DIFFERENTIAL/PLATELET
Abs Immature Granulocytes: 0.07 10*3/uL (ref 0.00–0.07)
Basophils Absolute: 0 10*3/uL (ref 0.0–0.1)
Basophils Relative: 0 %
Eosinophils Absolute: 0.3 10*3/uL (ref 0.0–0.5)
Eosinophils Relative: 2 %
HCT: 30.9 % — ABNORMAL LOW (ref 36.0–46.0)
Hemoglobin: 9.4 g/dL — ABNORMAL LOW (ref 12.0–15.0)
Immature Granulocytes: 1 %
Lymphocytes Relative: 12 %
Lymphs Abs: 1.4 10*3/uL (ref 0.7–4.0)
MCH: 29.4 pg (ref 26.0–34.0)
MCHC: 30.4 g/dL (ref 30.0–36.0)
MCV: 96.6 fL (ref 80.0–100.0)
Monocytes Absolute: 0.7 10*3/uL (ref 0.1–1.0)
Monocytes Relative: 6 %
Neutro Abs: 9.4 10*3/uL — ABNORMAL HIGH (ref 1.7–7.7)
Neutrophils Relative %: 79 %
Platelets: 171 10*3/uL (ref 150–400)
RBC: 3.2 MIL/uL — ABNORMAL LOW (ref 3.87–5.11)
RDW: 15.5 % (ref 11.5–15.5)
WBC: 11.8 10*3/uL — ABNORMAL HIGH (ref 4.0–10.5)
nRBC: 0 % (ref 0.0–0.2)

## 2018-09-06 LAB — CBG MONITORING, ED
Glucose-Capillary: 176 mg/dL — ABNORMAL HIGH (ref 70–99)
Glucose-Capillary: 128 mg/dL — ABNORMAL HIGH (ref 70–99)

## 2018-09-06 LAB — BASIC METABOLIC PANEL
Anion gap: 19 — ABNORMAL HIGH (ref 5–15)
BUN: 74 mg/dL — ABNORMAL HIGH (ref 8–23)
CO2: 24 mmol/L (ref 22–32)
Calcium: 8.7 mg/dL — ABNORMAL LOW (ref 8.9–10.3)
Chloride: 92 mmol/L — ABNORMAL LOW (ref 98–111)
Creatinine, Ser: 13.46 mg/dL — ABNORMAL HIGH (ref 0.44–1.00)
GFR calc Af Amer: 3 mL/min — ABNORMAL LOW (ref >60)
GFR calc non Af Amer: 2 mL/min — ABNORMAL LOW (ref >60)
Glucose, Bld: 237 mg/dL — ABNORMAL HIGH (ref 70–99)
Potassium: 5.4 mmol/L — ABNORMAL HIGH (ref 3.5–5.1)
Sodium: 135 mmol/L (ref 135–145)

## 2018-09-06 LAB — GLUCOSE, CAPILLARY
Glucose-Capillary: 83 mg/dL (ref 70–99)
Glucose-Capillary: 142 mg/dL — ABNORMAL HIGH (ref 70–99)

## 2018-09-06 LAB — SARS CORONAVIRUS 2 (TAT 6-24 HRS): SARS Coronavirus 2: NEGATIVE

## 2018-09-06 MED ORDER — MONTELUKAST SODIUM 10 MG PO TABS
10.0000 mg | ORAL_TABLET | Freq: Every day | ORAL | Status: DC
  Administered 2018-09-06: 10 mg via ORAL
  Filled 2018-09-06 (×2): qty 1

## 2018-09-06 MED ORDER — ROSUVASTATIN CALCIUM 20 MG PO TABS
40.0000 mg | ORAL_TABLET | Freq: Every day | ORAL | Status: DC
  Administered 2018-09-06: 40 mg via ORAL
  Filled 2018-09-06: qty 2

## 2018-09-06 MED ORDER — SEVELAMER CARBONATE 800 MG PO TABS: 800.0000 mg | ORAL_TABLET | Freq: Three times a day (TID) | ORAL | Status: DC

## 2018-09-06 MED ORDER — HEPARIN SODIUM (PORCINE) 1000 UNIT/ML IJ SOLN
INTRAMUSCULAR | Status: AC
  Filled 2018-09-06: qty 4

## 2018-09-06 MED ORDER — ACETAMINOPHEN 325 MG PO TABS: 650.0000 mg | ORAL_TABLET | Freq: Four times a day (QID) | ORAL | Status: DC | PRN

## 2018-09-06 MED ORDER — INSULIN ASPART 100 UNIT/ML ~~LOC~~ SOLN: 0.0000 [IU] | Freq: Every day | SUBCUTANEOUS | Status: DC

## 2018-09-06 MED ORDER — LEVOTHYROXINE SODIUM 112 MCG PO TABS
112.0000 ug | ORAL_TABLET | Freq: Every day | ORAL | Status: DC
  Administered 2018-09-07: 112 ug via ORAL
  Filled 2018-09-06: qty 1

## 2018-09-06 MED ORDER — CLONAZEPAM 0.5 MG PO TABS: 1.0000 mg | ORAL_TABLET | Freq: Two times a day (BID) | ORAL | Status: DC | PRN

## 2018-09-06 MED ORDER — ACETAMINOPHEN 650 MG RE SUPP: 650.0000 mg | Freq: Four times a day (QID) | RECTAL | Status: DC | PRN

## 2018-09-06 MED ORDER — INSULIN DETEMIR 100 UNIT/ML ~~LOC~~ SOLN
15.0000 [IU] | Freq: Every day | SUBCUTANEOUS | Status: DC
  Administered 2018-09-07: 15 [IU] via SUBCUTANEOUS
  Filled 2018-09-06: qty 0.15

## 2018-09-06 MED ORDER — SEVELAMER CARBONATE 800 MG PO TABS
2400.0000 mg | ORAL_TABLET | Freq: Three times a day (TID) | ORAL | Status: DC
  Administered 2018-09-06 – 2018-09-07 (×3): 2400 mg via ORAL
  Filled 2018-09-06 (×3): qty 3

## 2018-09-06 MED ORDER — MIDODRINE HCL 5 MG PO TABS
10.0000 mg | ORAL_TABLET | Freq: Three times a day (TID) | ORAL | Status: DC
  Administered 2018-09-06: 14:00:00 10 mg via ORAL

## 2018-09-06 MED ORDER — CALCITRIOL 0.5 MCG PO CAPS
1.5000 ug | ORAL_CAPSULE | ORAL | Status: DC
  Administered 2018-09-06: 1.5 ug via ORAL

## 2018-09-06 MED ORDER — MIDODRINE HCL 5 MG PO TABS: 10.0000 mg | ORAL_TABLET | ORAL | Status: DC

## 2018-09-06 MED ORDER — IPRATROPIUM BROMIDE 0.02 % IN SOLN: 0.5000 mg | Freq: Four times a day (QID) | RESPIRATORY_TRACT | Status: DC

## 2018-09-06 MED ORDER — HYDROXYZINE HCL 25 MG PO TABS: 25.0000 mg | ORAL_TABLET | Freq: Three times a day (TID) | ORAL | Status: DC | PRN

## 2018-09-06 MED ORDER — MIDODRINE HCL 5 MG PO TABS
ORAL_TABLET | ORAL | Status: AC
  Filled 2018-09-06: qty 2

## 2018-09-06 MED ORDER — CALCITRIOL 0.25 MCG PO CAPS: 0.2500 ug | ORAL_CAPSULE | Freq: Every day | ORAL | Status: DC

## 2018-09-06 MED ORDER — HEPARIN SODIUM (PORCINE) 1000 UNIT/ML IJ SOLN
3.7000 mL | Freq: Once | INTRAMUSCULAR | Status: AC
  Administered 2018-09-06: 3700 [IU] via INTRAVENOUS
  Filled 2018-09-06: qty 3.7

## 2018-09-06 MED ORDER — SORBITOL 70 % SOLN
30.0000 mL | Status: DC | PRN
  Filled 2018-09-06: qty 30

## 2018-09-06 MED ORDER — FLUOXETINE HCL 10 MG PO CAPS
10.0000 mg | ORAL_CAPSULE | Freq: Every day | ORAL | Status: DC
  Administered 2018-09-07: 10 mg via ORAL
  Filled 2018-09-06: qty 1

## 2018-09-06 MED ORDER — CALCITRIOL 0.5 MCG PO CAPS
ORAL_CAPSULE | ORAL | Status: AC
  Filled 2018-09-06: qty 3

## 2018-09-06 MED ORDER — ONDANSETRON HCL 4 MG/2ML IJ SOLN
4.0000 mg | Freq: Four times a day (QID) | INTRAMUSCULAR | Status: DC | PRN
  Administered 2018-09-06: 4 mg via INTRAVENOUS

## 2018-09-06 MED ORDER — FUROSEMIDE 20 MG PO TABS
80.0000 mg | ORAL_TABLET | ORAL | Status: DC
  Administered 2018-09-07: 80 mg via ORAL
  Filled 2018-09-06: qty 4

## 2018-09-06 MED ORDER — CINACALCET HCL 30 MG PO TABS
60.0000 mg | ORAL_TABLET | ORAL | Status: DC
  Filled 2018-09-06: qty 2

## 2018-09-06 MED ORDER — SENNOSIDES-DOCUSATE SODIUM 8.6-50 MG PO TABS
1.0000 | ORAL_TABLET | Freq: Every day | ORAL | Status: DC
  Administered 2018-09-06: 1 via ORAL
  Filled 2018-09-06: qty 1

## 2018-09-06 MED ORDER — NEPRO/CARBSTEADY PO LIQD
237.0000 mL | Freq: Three times a day (TID) | ORAL | Status: DC | PRN
  Filled 2018-09-06: qty 237

## 2018-09-06 MED ORDER — CAMPHOR-MENTHOL 0.5-0.5 % EX LOTN
1.0000 "application " | TOPICAL_LOTION | Freq: Three times a day (TID) | CUTANEOUS | Status: DC | PRN
  Filled 2018-09-06: qty 222

## 2018-09-06 MED ORDER — CALCIUM CARBONATE ANTACID 1250 MG/5ML PO SUSP
500.0000 mg | Freq: Four times a day (QID) | ORAL | Status: DC | PRN
  Filled 2018-09-06: qty 5

## 2018-09-06 MED ORDER — LIDOCAINE 5 % EX PTCH: 1.0000 | MEDICATED_PATCH | Freq: Two times a day (BID) | CUTANEOUS | Status: DC | PRN

## 2018-09-06 MED ORDER — ONDANSETRON HCL 4 MG PO TABS: 4.0000 mg | ORAL_TABLET | Freq: Four times a day (QID) | ORAL | Status: DC | PRN

## 2018-09-06 MED ORDER — ONDANSETRON HCL 4 MG/2ML IJ SOLN
INTRAMUSCULAR | Status: AC
  Filled 2018-09-06: qty 2

## 2018-09-06 MED ORDER — ZOLPIDEM TARTRATE 5 MG PO TABS: 5.0000 mg | ORAL_TABLET | Freq: Every evening | ORAL | Status: DC | PRN

## 2018-09-06 MED ORDER — DOCUSATE SODIUM 283 MG RE ENEM
1.0000 | ENEMA | RECTAL | Status: DC | PRN
  Filled 2018-09-06: qty 1

## 2018-09-06 MED ORDER — ALBUTEROL SULFATE (2.5 MG/3ML) 0.083% IN NEBU: 3.0000 mL | INHALATION_SOLUTION | Freq: Four times a day (QID) | RESPIRATORY_TRACT | Status: DC | PRN

## 2018-09-06 MED ORDER — CHLORHEXIDINE GLUCONATE CLOTH 2 % EX PADS
6.0000 | MEDICATED_PAD | Freq: Every day | CUTANEOUS | Status: DC
  Administered 2018-09-07: 6 via TOPICAL

## 2018-09-06 MED ORDER — INSULIN ASPART 100 UNIT/ML ~~LOC~~ SOLN
0.0000 [IU] | Freq: Three times a day (TID) | SUBCUTANEOUS | Status: DC
  Administered 2018-09-06 – 2018-09-07 (×2): 1 [IU] via SUBCUTANEOUS

## 2018-09-06 NOTE — Progress Notes (Signed)
PT Cancellation Note  Patient Details Name: Anana Deyton MRN: [id number] DOB: 11-Apr-1945   Cancelled Treatment:    Reason Eval/Treat Not Completed: Patient at procedure or test/unavailable   Currently in HD;  Will follow up later today as time allows;  Otherwise, will follow up for PT tomorrow;   Thank you,  Roney Marion, [place] Pager [phone] Office [place] 09/06/2018, 1:48 PM

## 2018-09-06 NOTE — ED Notes (Signed)
ED TO INPATIENT HANDOFF REPORT  ED Nurse Name and Phone #: [phone]  S Name/Age/Gender Joan Mosley 73 y.o. female Room/Bed: 056C/056C  Code Status   Code Status: Full Code  Home/SNF/Other Home Patient oriented to: self, place, time and situation Is this baseline? Yes   Triage Complete: Triage complete  Chief Complaint weakness  Triage Note Pt arrives via EMS from home. Was transferring from wheelchair to car to go to regularly scheduled dialysis today. Pt was c/o extreme weakness and fatigue. Family unable to get patient into car. Pt assisted onto ground by family. Pt normally wears o2 at 5L. VSS. Pt alert, oriented x4.    Allergies Allergies  Allergen Reactions  . Avelox [Moxifloxacin Hcl In Nacl]     PT does not remember  . Codeine Anaphylaxis    PT does not remember  . Other     PT does not remember  . Penicillins     Rash  . Sulfa Antibiotics     PT does nolt remember  . Shellfish Allergy Hives, Itching and Rash    Level of Care/Admitting Diagnosis ED Disposition    ED Disposition Condition Comment   Admit  Hospital Area: [facility] [[id number]]  Level of Care: Progressive [102]  I expect the patient will be discharged within 24 hours: No (not a candidate for 5C-Observation unit)  Covid Evaluation: Asymptomatic Screening Protocol (No Symptoms)  Diagnosis: SDH (subdural hematoma) ([place]) [id number]  Admitting Physician: Karmen Bongo [[id number]]  Attending Physician: Karmen Bongo [[id number]]  Bed request comments: neuroprogressive  PT Class (Do Not Modify): Observation [104]  PT Acc Code (Do Not Modify): Observation [10022]       B Medical/Surgery History Past Medical History:  Diagnosis Date  . Arthritis   . COPD (chronic obstructive pulmonary disease) ([place])   . Diabetes mellitus without complication ([place])   . Diabetic neuropathy ([place])   . 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 Renal disorder    ESRD  . Thyroid disease   . TIA  (transient ischemic attack)    Past Surgical History:  Procedure Laterality Date  . ABDOMINAL HYSTERECTOMY    . RECTOCELE REPAIR    . REPLACEMENT TOTAL KNEE BILATERAL Bilateral 2008  . THYROIDECTOMY     80%  . VAGINAL WOUND CLOSURE / REPAIR       A IV Location/Drains/Wounds Patient Lines/Drains/Airways Status   Active Line/Drains/Airways    Name:   Placement date:   Placement time:   Site:   Days:   Peripheral IV 09/06/18 Anterior;Right Forearm   09/06/18    1004    Forearm   less than 1   Hemodialysis Catheter Right Subclavian Double-lumen   -    -    Subclavian             Intake/Output Last 24 hours No intake or output data in the 24 hours ending 09/06/18 1336  Labs/Imaging Results for orders placed or performed during the hospital encounter of 09/06/18 (from the past 48 hour(s))  Basic metabolic panel     Status: Abnormal   Collection Time: 09/06/18  7:44 AM  Result Value Ref Range   Sodium 135 135 - 145 mmol/L   Potassium 5.4 (H) 3.5 - 5.1 mmol/L   Chloride 92 (L) 98 - 111 mmol/L   CO2 24 22 - 32 mmol/L   Glucose, Bld 237 (H) 70 - 99 mg/dL   BUN 74 (H) 8 - 23 mg/dL  Creatinine, Ser 13.46 (H) 0.44 - 1.00 mg/dL   Calcium 8.7 (L) 8.9 - 10.3 mg/dL   GFR calc non Af Amer 2 (L) >60 mL/min   GFR calc Af Amer 3 (L) >60 mL/min   Anion gap 19 (H) 5 - 15    Comment: Performed at [facility] [address], [place], [place] [address]  CBC with Differential     Status: Abnormal   Collection Time: 09/06/18  7:44 AM  Result Value Ref Range   WBC 11.8 (H) 4.0 - 10.5 K/uL   RBC 3.20 (L) 3.87 - 5.11 MIL/uL   Hemoglobin 9.4 (L) 12.0 - 15.0 g/dL   HCT 30.9 (L) 36.0 - 46.0 %   MCV 96.6 80.0 - 100.0 fL   MCH 29.4 26.0 - 34.0 pg   MCHC 30.4 30.0 - 36.0 g/dL   RDW 15.5 11.5 - 15.5 %   Platelets 171 150 - 400 K/uL   nRBC 0.0 0.0 - 0.2 %   Neutrophils Relative % 79 %   Neutro Abs 9.4 (H) 1.7 - 7.7 K/uL   Lymphocytes Relative 12 %   Lymphs Abs 1.4 0.7 - 4.0 K/uL    Monocytes Relative 6 %   Monocytes Absolute 0.7 0.1 - 1.0 K/uL   Eosinophils Relative 2 %   Eosinophils Absolute 0.3 0.0 - 0.5 K/uL   Basophils Relative 0 %   Basophils Absolute 0.0 0.0 - 0.1 K/uL   Immature Granulocytes 1 %   Abs Immature Granulocytes 0.07 0.00 - 0.07 K/uL    Comment: Performed at [facility], [place] [address], [place], [place] [address]  CBG monitoring, ED     Status: Abnormal   Collection Time: 09/06/18  7:54 AM  Result Value Ref Range   Glucose-Capillary 176 (H) 70 - 99 mg/dL   Comment 1 Notify RN    Comment 2 Document in Chart   CBG monitoring, ED     Status: Abnormal   Collection Time: 09/06/18 11:52 AM  Result Value Ref Range   Glucose-Capillary 128 (H) 70 - 99 mg/dL   Ct Head Wo Contrast  Result Date: 09/06/2018 CLINICAL DATA:  Altered mental status and fatigue. Recent subdural hematomas EXAM: CT HEAD WITHOUT CONTRAST TECHNIQUE: Contiguous axial images were obtained from the base of the skull through the vertex without intravenous contrast. COMPARISON:  August 25, 2018 FINDINGS: Brain: There is age related volume loss. There remains a subdural hematoma in the right frontal region with both acute and more chronic appearing fluid. There remains a degree of mass effect on the right frontal lobe, slightly less than on the previous study. The current maximum thickness of the subdural hematoma on the right measures 1.4 cm compared to a maximum thickness of 1.6 cm. There is a subdural hematoma on the left in the frontal and parietal regions which also has mixed attenuation consistent with both relatively acute and more subacute to chronic hemorrhage. The maximum thickness of this subdural hematoma measures 1.3 cm compared to 1.6 cm on most recent study. No new hemorrhage or new extra-axial fluid collections are evident. There is no intra-axial mass or hemorrhage. Currently there is 2 mm of midline shift to the right compared to 5 mm of midline shift to the right on most  recent study. There is small vessel disease in the centra semiovale bilaterally. No acute infarct is appreciable. Vascular: No hyperdense vessels are evident. There is calcification in each carotid siphon region and distal left vertebral  artery. Skull: The bony calvarium appears intact. Sinuses/Orbits: There is opacification throughout the right maxillary antrum. There are more scattered areas of opacification in the left maxillary antrum with probable polyp superiorly. There is mucosal thickening in several ethmoid air cells as well as in the right frontal sinus region. Orbits appear symmetric bilaterally. Other: There is opacification of several inferior mastoid air cells bilaterally, stable. IMPRESSION: 1. There remain bilateral subdural hematomas with both more acute and more chronic appearing fluid on each side. The right subdural hematoma is seen in the right frontal region with mild mass effect on the right frontal lobe. This subdural hematoma at its maximum measures 1.4 cm compared to a thickness of 1.6 cm on the previous study. On the left, the subdural hematoma is slightly larger and involves frontal and parietal regions. The mixed attenuation in the fluid remains stable. The maximum thickness on the left currently measures 1.3 cm compared to 1.6 cm on the previous study. There appears to be slightly less mass effect from the subdural hematomas compared to recent prior study. There is 2 mm of midline shift to the right compared to 5 mm of midline shift to the right on the previous study. 2. No intra-axial hemorrhage. There is patchy periventricular small vessel disease. No acute infarct evident. 3.  Foci of arterial vascular calcification noted. 4. Multifocal paranasal sinus disease again noted. Mild inferior mastoid disease bilaterally noted. Electronically Signed   By: Lowella Grip III M.D.   On: 09/06/2018 08:43    Pending Labs Unresulted Labs (From admission, onward)    Start     Ordered    09/07/18 [id number]  Basic metabolic panel  Tomorrow morning,   R     09/06/18 0949   09/07/18 0500  CBC  Tomorrow morning,   R     09/06/18 0949   09/06/18 0905  SARS CORONAVIRUS 2 Nasal Swab Aptima Multi Swab  (Asymptomatic/Tier 2 Patients Labs)  Once,   STAT    Question Answer Comment  Is this test for diagnosis or screening Screening   Symptomatic for COVID-19 as defined by CDC No   Hospitalized for COVID-19 No   Admitted to ICU for COVID-19 No   Previously tested for COVID-19 Yes   Resident in a congregate (group) care setting No   Employed in healthcare setting No   Pregnant No      09/06/18 0909          Vitals/Pain Today's Vitals   09/06/18 1130 09/06/18 1200 09/06/18 1215 09/06/18 1230  BP: (!) 143/88 (!) 143/84 (!) 144/82 (!) 145/76  Pulse: 88 92 84 97  Resp: (!) 26 (!) 31 (!) 29 (!) 27  Temp:      TempSrc:      SpO2: 94% 96% 96% 93%  Weight:      Height:      PainSc:        Isolation Precautions No active isolations  Medications Medications  furosemide (LASIX) tablet 80 mg (has no administration in time range)  midodrine (PROAMATINE) tablet 10 mg (has no administration in time range)  rosuvastatin (CRESTOR) tablet 40 mg (has no administration in time range)  FLUoxetine (PROZAC) tablet 10 mg (has no administration in time range)  insulin detemir (LEVEMIR) injection 15 Units (has no administration in time range)  levothyroxine (SYNTHROID) tablet 112 mcg (has no administration in time range)  senna-docusate (Senokot-S) tablet 1 tablet (has no administration in time range)  clonazePAM (KLONOPIN) tablet 1 mg (has  no administration in time range)  albuterol (PROVENTIL) (2.5 MG/3ML) 0.083% nebulizer solution 3 mL (has no administration in time range)  ipratropium (ATROVENT) nebulizer solution 0.5 mg (has no administration in time range)  montelukast (SINGULAIR) tablet 10 mg (has no administration in time range)  lidocaine (LIDODERM) 5 % 1 patch (has no administration  in time range)  acetaminophen (TYLENOL) tablet 650 mg (has no administration in time range)    Or  acetaminophen (TYLENOL) suppository 650 mg (has no administration in time range)  zolpidem (AMBIEN) tablet 5 mg (has no administration in time range)  sorbitol 70 % solution 30 mL (has no administration in time range)  docusate sodium (ENEMEEZ) enema 283 mg (has no administration in time range)  ondansetron (ZOFRAN) tablet 4 mg (has no administration in time range)    Or  ondansetron (ZOFRAN) injection 4 mg (has no administration in time range)  camphor-menthol (SARNA) lotion 1 application (has no administration in time range)    And  hydrOXYzine (ATARAX/VISTARIL) tablet 25 mg (has no administration in time range)  calcium carbonate (dosed in mg elemental calcium) suspension 500 mg of elemental calcium (has no administration in time range)  feeding supplement (NEPRO CARB STEADY) liquid 237 mL (has no administration in time range)  insulin aspart (novoLOG) injection 0-9 Units (1 Units Subcutaneous Given 09/06/18 1159)  insulin aspart (novoLOG) injection 0-5 Units (has no administration in time range)  Chlorhexidine Gluconate Cloth 2 % PADS 6 each (has no administration in time range)  calcitRIOL (ROCALTROL) capsule 1.5 mcg (has no administration in time range)  cinacalcet (SENSIPAR) tablet 60 mg (has no administration in time range)  sevelamer carbonate (RENVELA) tablet 2,400 mg (has no administration in time range)  midodrine (PROAMATINE) 5 MG tablet (has no administration in time range)  calcitRIOL (ROCALTROL) 0.5 MCG capsule (has no administration in time range)    Mobility manual wheelchair Moderate fall risk   Focused Assessments    R Recommendations: See Admitting Provider Note  Report given to:   Additional Notes: Pt [facility]

## 2018-09-06 NOTE — H&P (Signed)
History and Physical    Joan Mosley [id number] DOB: 09/14/1945 DOA: 09/06/2018  PCP: Flossie Buffy, NP Consultants:  Nephrology  Patient coming from:  Home - lives with daughter, son, grandson; NOK: Son or daughter  Chief Complaint: Weakness  HPI: Joan Mosley is a 73 y.o. female with medical history significant of TIA; COPD on home 5L O2; ESRD on TTS HD; morbid obesity (BMI 47); and DM presenting with weakness.  She couldn't get into the car today, collapsed to the ground.  She did not lose consciousness, wasn't able to lift her leg.  This has happened a couple of times in the last 2 weeks.  She will not consider rehab placement to get stronger.  No headaches or vision changes.  She may be a bit weaker than when she was here before.  She is not interested in  [facility] - she cannot have anesthesia due to her severe COPD.    She was admitted from 7/27-30 with acute metabolic encephalopathy attributed to B subdural hematomas.    ED Course:  Needs obs for SDH.  Generally weak, similar to prior, ?worse.  SDH with ?acute component, neurosurgery recommends monitoring overnight with repeating head CT if neurologic symptoms progress.  Nephrology consulted, needs HD.  Review of Systems: As per HPI; otherwise review of systems reviewed and negative.   Ambulatory Status:  Wheelchair dependent, unable to transfer  Past Medical History:  Diagnosis Date  . Arthritis   . COPD (chronic obstructive pulmonary disease) ([place])   . Diabetes mellitus without complication ([place])   . Diabetic neuropathy ([place])   . 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 Renal disorder    ESRD  . Thyroid disease   . TIA (transient ischemic attack)     Past Surgical History:  Procedure Laterality Date  . ABDOMINAL HYSTERECTOMY    . RECTOCELE REPAIR    . REPLACEMENT TOTAL KNEE BILATERAL Bilateral 2008  . THYROIDECTOMY     80%  . VAGINAL WOUND CLOSURE / REPAIR      Social History   Socioeconomic  History  . Marital status: Widowed    Spouse name: Not on file  . Number of children: 5  . Years of education: Not on file  . Highest education level: Not on file  Occupational History    Comment: disabled  Social Needs  . Financial resource strain: Not on file  . Food insecurity    Worry: Not on file    Inability: Not on file  . Transportation needs    Medical: Not on file    Non-medical: Not on file  Tobacco Use  . Smoking status: Former Smoker    Packs/day: 1.00    Years: 35.00    Pack years: 35.00    Quit date: 03/29/1981    Years since quitting: 37.4  . Smokeless tobacco: Never Used  Substance and Sexual Activity  . Alcohol use: Never    Frequency: Never  . Drug use: Never  . Sexual activity: Not Currently  Lifestyle  . Physical activity    Days per week: Not on file    Minutes per session: Not on file  . Stress: Not on file  Relationships  . Social Herbalist on phone: Not on file    Gets together: Not on file    Attends religious service: Not on file    Active member of club or organization: Not on file    Attends meetings  of clubs or organizations: Not on file    Relationship status: Not on file  . Intimate partner violence    Fear of current or ex partner: Not on file    Emotionally abused: Not on file    Physically abused: Not on file    Forced sexual activity: Not on file  Other Topics Concern  . Not on file  Social History Narrative  . Not on file    Allergies  Allergen Reactions  . Avelox [Moxifloxacin Hcl In Nacl]     PT does not remember  . Codeine Anaphylaxis    PT does not remember  . Other     PT does not remember  . Penicillins     Rash  . Sulfa Antibiotics     PT does nolt remember  . Shellfish Allergy Hives, Itching and Rash    Family History  Problem Relation Age of Onset  . Heart disease Mother   . Hypertension Mother   . Heart disease Father   . Hypertension Father     Prior to Admission medications    Medication Sig Start Date End Date Taking? Authorizing Provider  acetaminophen (TYLENOL) 500 MG tablet Take 1,000 mg by mouth every 6 (six) hours as needed for mild pain.    [provider]  albuterol (VENTOLIN HFA) 108 (90 Base) MCG/ACT inhaler Inhale 2 puffs into the lungs every 6 (six) hours as needed for wheezing or shortness of breath.    [provider]  calcitRIOL (ROCALTROL) 0.25 MCG capsule Take 0.25 mcg by mouth daily.    [provider]  clonazePAM (KLONOPIN) 1 MG tablet Take 1 tablet (1 mg total) by mouth 2 (two) times daily as needed for anxiety. Change in dose 08/08/18   Nche, Charlene Brooke, NP  FLUoxetine (PROZAC) 10 MG tablet Take 1 tablet (10 mg total) by mouth daily. 08/08/18   Nche, Charlene Brooke, NP  furosemide (LASIX) 80 MG tablet Take 80 mg by mouth See admin instructions. Sunday, Monday,Wed,Friday--in AM     [provider]  insulin detemir (LEVEMIR) 100 UNIT/ML injection Inject 0.15 mLs (15 Units total) into the skin daily. 07/04/18   Nche, Charlene Brooke, NP  insulin lispro (HUMALOG) 100 UNIT/ML injection Inject 1-5 Units into the skin 3 (three) times daily with meals as needed for high blood sugar. Sliding scale     [provider]  ipratropium (ATROVENT) 0.02 % nebulizer solution Inhale 0.5 mg into the lungs 4 (four) times daily.     [provider]  levothyroxine (SYNTHROID) 112 MCG tablet Take 1 tablet (112 mcg total) by mouth daily before breakfast. 1 AM 07/04/18   Nche, Charlene Brooke, NP  lidocaine (LIDODERM) 5 % Place 1 patch onto the skin every 12 (twelve) hours as needed (back pain). Remove & Discard patch within 12 hours or as directed by MD     [provider]  midodrine (PROAMATINE) 10 MG tablet Take 10 mg by mouth 3 (three) times daily. Taking on Dialysis days, Tues, Thur, Sat.    [provider]  montelukast (SINGULAIR) 10 MG tablet Take 10 mg by mouth at bedtime. PM    [provider]   nystatin (MYCOSTATIN/NYSTOP) powder Apply 1 g topically 2 (two) times daily as needed (skin rash).     [provider]  nystatin cream (MYCOSTATIN) Apply 1 application topically 2 (two) times daily as needed for dry skin.     [provider]  rosuvastatin (CRESTOR) 40  MG tablet Take 40 mg by mouth daily. AM    [provider]  senna-docusate (SENNA PLUS) 8.6-50 MG tablet Take 1 tablet by mouth at bedtime. 07/04/18   Nche, Charlene Brooke, NP  sevelamer (RENAGEL) 800 MG tablet Take 1,600 mg by mouth 3 (three) times daily with meals. 1600 mg --3 times daily--take additional 1-2 if eats snack    [provider]    Physical Exam: Vitals:   09/06/18 0752 09/06/18 0753 09/06/18 0800 09/06/18 0930  BP: (!) 152/79  (!) 153/77   Pulse: 88  89 88  Resp: (!) 23  (!) 25 (!) 26  Temp: 97.7 F (36.5 C)     TempSrc: Oral     SpO2: 100%  99% 90%  Weight:  113.4 kg    Height:  5\' 1"  (1.549 m)       . General:  Appears calm and comfortable and is NAD; morbidly obese, chronically ill . Eyes:  PERRL, EOMI, normal lids, iris . ENT:  grossly normal hearing, lips & tongue, mmm . Neck:  no LAD, masses or thyromegaly . Cardiovascular:  RRR, no m/r/g. No LE edema.  Marland Kitchen Respiratory:   CTA bilaterally with no wheezes/rales/rhonchi.  Normal to mildly increased respiratory effort. . Abdomen:  soft, NT, ND, NABS . Skin:  no rash or induration seen on limited exam . Musculoskeletal:  grossly normal tone BUE/BLE, no bony abnormality . Lower extremity:  No LE edema.  Limited foot exam with no ulcerations.  2+ distal pulses. Marland Kitchen Psychiatric:  blunted mood and affect, speech fluent and appropriate, AOx3 Neurologic:  CN 2-12 grossly intact    Radiological Exams on Admission: Ct Head Wo Contrast  Result Date: 09/06/2018 CLINICAL DATA:  Altered mental status and fatigue. Recent subdural hematomas EXAM: CT HEAD WITHOUT CONTRAST TECHNIQUE: Contiguous axial images were obtained from the  base of the skull through the vertex without intravenous contrast. COMPARISON:  August 25, 2018 FINDINGS: Brain: There is age related volume loss. There remains a subdural hematoma in the right frontal region with both acute and more chronic appearing fluid. There remains a degree of mass effect on the right frontal lobe, slightly less than on the previous study. The current maximum thickness of the subdural hematoma on the right measures 1.4 cm compared to a maximum thickness of 1.6 cm. There is a subdural hematoma on the left in the frontal and parietal regions which also has mixed attenuation consistent with both relatively acute and more subacute to chronic hemorrhage. The maximum thickness of this subdural hematoma measures 1.3 cm compared to 1.6 cm on most recent study. No new hemorrhage or new extra-axial fluid collections are evident. There is no intra-axial mass or hemorrhage. Currently there is 2 mm of midline shift to the right compared to 5 mm of midline shift to the right on most recent study. There is small vessel disease in the centra semiovale bilaterally. No acute infarct is appreciable. Vascular: No hyperdense vessels are evident. There is calcification in each carotid siphon region and distal left vertebral artery. Skull: The bony calvarium appears intact. Sinuses/Orbits: There is opacification throughout the right maxillary antrum. There are more scattered areas of opacification in the left maxillary antrum with probable polyp superiorly. There is mucosal thickening in several ethmoid air cells as well as in the right frontal sinus region. Orbits appear symmetric bilaterally. Other: There is opacification of several inferior mastoid air cells bilaterally, stable. IMPRESSION: 1. There remain bilateral subdural hematomas with both more  acute and more chronic appearing fluid on each side. The right subdural hematoma is seen in the right frontal region with mild mass effect on the right frontal lobe.  This subdural hematoma at its maximum measures 1.4 cm compared to a thickness of 1.6 cm on the previous study. On the left, the subdural hematoma is slightly larger and involves frontal and parietal regions. The mixed attenuation in the fluid remains stable. The maximum thickness on the left currently measures 1.3 cm compared to 1.6 cm on the previous study. There appears to be slightly less mass effect from the subdural hematomas compared to recent prior study. There is 2 mm of midline shift to the right compared to 5 mm of midline shift to the right on the previous study. 2. No intra-axial hemorrhage. There is patchy periventricular small vessel disease. No acute infarct evident. 3.  Foci of arterial vascular calcification noted. 4. Multifocal paranasal sinus disease again noted. Mild inferior mastoid disease bilaterally noted. Electronically Signed   By: Lowella Grip III M.D.   On: 09/06/2018 08:43    EKG: Independently reviewed.  NSR with rate 92; nonspecific ST changes with no evidence of acute ischemia   Labs on Admission: I have personally reviewed the available labs and imaging studies at the time of the admission.  Pertinent labs:   K+ 5.4 Glucose 237 BUN 74/Creatinine 13.46/GFR 3 Anion 19 WBC 11.8 Hgb 9.4 COVID pending   Assessment/Plan Principal Problem:   SDH (subdural hematoma) (HCC) Active Problems:   Counseling regarding advanced directives and goals of care   Hemodialysis-associated hypotension   Type 2 diabetes mellitus with diabetic polyneuropathy, with long-term current use of insulin (HCC)   Gait instability   ESRD (end stage renal disease) (HCC)   Chronic respiratory failure with hypoxia, on home O2 therapy (HCC)   SDH -Patient previously admitted with B SDH -Appears to have chronic weakness, recommended to go to SNF and declined -Chronic weakness, unable to ambulate at baseline Possibly somewhat worse since last hospitalization - likely due to progressive  immobility -However, imaging today appears to indicate acute on chronic SDH -She is not on [facility] -Neurosurgery is recommending overnight observation with neuro checks; consult is pending -She reports that she would not desire surgery due to anesthesia risks associated with her severe underlying lung disease -For now, will observe in neuro-progressive unit -Aspirin and clopidogrel were previously discontinued.    Chronic respiratory failure associated with COPD -Patient is on 5L home O2 -She reports no significant change in her baseline COPD at this time -Continue atrovent QID and Albuterol prn  ESRD on HD -Patient on chronic TTS HD -Nephrology prn order set utilized -She does not appear to be volume overloaded or otherwise in need of acute HD -Nephrology is aware that patient will need HD, as she is due today -Continue Calcitriol, Renagel -Continue Lasix on non-HD days  Gait instability -Patient is immobile at baseline, and this appears to be worsening -Due prior hospitalization, physical therapy recommended skilled nursing facility but she declined.   -She continues to decline therapy, but may be willing to consider CIR -I have placed consults for PT, OT, and CIR evaluations  Goals of care -Patient has severe COPD and ESRD at baseline -Now with acute on chronic SDH -Progressive weakness, wheelchair-dependent -As such, it seems reasonable to address goals of care -Despite her awareness that she is not a candidate for future surgeries/anesthesia due to her severe COPD, she had not apparently previously considered CPR with  intubation and the low likelihood of success -She currently wishes to remain a full code, but is receptive to palliative care consultation for further discussion  HD-associated hypotension -Continue Midodrine on HD days  DM -Continue Levemir -Cover with sensitive-scale SSI  Hypothyroidism -Continue Synthroid at current dose    Note: This patient has been  tested and is pending for the novel coronavirus COVID-19. She was previously negative on 7/27, 7/6, and 3/29  DVT prophylaxis:  SCDs Code Status:  Full - confirmed with patient Family Communication: None present Disposition Plan:  To be determined Consults called: Neurosurgery, nephrology, CIR; PT, OT Admission status: It is my clinical opinion that referral for OBSERVATION is reasonable and necessary in this patient based on the above information provided. The aforementioned taken together are felt to place the patient at high risk for further clinical deterioration. However it is anticipated that the patient may be medically stable for discharge from the hospital within 24 to 48 hours.    Karmen Bongo MD [facility]   How to contact the [facility] Attending or Consulting provider [place] or covering provider during after hours [place], for this patient?  1. Check the care team in [facility] and look for a) attending/consulting [facility] provider listed and b) the [facility] team listed 2. Log into [email or web address] and use [facility]'s universal password to access. If you do not have the password, please contact the hospital operator. 3. Locate the [facility] provider you are looking for under [facility] and page to a number that you can be directly reached. 4. If you still have difficulty reaching the provider, please page the [facility] (Director on Call) for the Hospitalists listed on amion for assistance.   09/06/2018, 10:05 AM

## 2018-09-06 NOTE — Consult Note (Signed)
Neurosurgery Consultation  Reason for Consult: Bilateral subdural hematomas Referring Physician: Lorin Mercy  CC: fall  HPI: This is a 73 y.o. woman w/ COPD on home O2, ESRD, that presents after a fall. She was trying to get into her car and had trouble getting her leg into the car, causing her to fall. Maintained consciousness the entirety of the event, no presyncopal Sx. She denies any new weakness, numbness, or parasthesias, no headache. She was previously on clopidogrel and was seen by Dr. Arnoldo Morale for subdural hematomas, at which time the plavix was stopped.    ROS: A 14 point ROS was performed and is negative except as noted in the HPI.   PMHx:  Past Medical History:  Diagnosis Date  . Arthritis   . COPD (chronic obstructive pulmonary disease) ([place])   . Diabetes mellitus without complication ([place])   . Diabetic neuropathy ([place])   . 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 Renal disorder    ESRD  . Thyroid disease   . TIA (transient ischemic attack)    FamHx:  Family History  Problem Relation Age of Onset  . Heart disease Mother   . Hypertension Mother   . Heart disease Father   . Hypertension Father    SocHx:  reports that she quit smoking about 37 years ago. She has a 35.00 pack-year smoking history. She has never used smokeless tobacco. She reports that she does not drink alcohol or use drugs.  Exam: Vital signs in last 24 hours: Temp:  [97.7 F (36.5 C)] 97.7 F (36.5 C) (08/11 0752) Pulse Rate:  [88-89] 88 (08/11 0930) Resp:  [23-26] 26 (08/11 0930) BP: (152-153)/(77-79) 153/77 (08/11 0800) SpO2:  [90 %-100 %] 90 % (08/11 0930) Weight:  [113.4 kg] 113.4 kg (08/11 0753) General: Awake, alert, cooperative, lying in bed in NAD, appears chronically ill Head: normocephalic and atruamatic HEENT: neck supple Pulmonary: breathing supplemental O2 with some increased accessory use, but comfortable Cardiac: RRR Abdomen: S NT ND Extremities: warm x4, significant distal  extremity skin changes Neuro: AOx3, PERRL, EOMI, FS Strength 5/5 x4, SILTx4, no drift   Assessment and Plan: 73 y.o. woman s/p mechanical fall. [place] personally reviewed, which shows bilateral subdural hematomas, mildly increased since a few weeks ago with some small acute blood products on the right.   -no acute neurosurgical intervention indicated at this time, pt appears asymptomatic -recommend observation overnight to make sure she doesn't develop Sx, given the acute blood products. No need for scheduled repeat imaging, can follow clinically -okay for IV heparin during HD if needed, would continue holding her anti-platelet agents -please call with any concerns or questions  Judith Part, MD 09/06/18 10:38 AM [facility]

## 2018-09-06 NOTE — ED Triage Notes (Signed)
Pt arrives via EMS from home. Was transferring from wheelchair to car to go to regularly scheduled dialysis today. Pt was c/o extreme weakness and fatigue. Family unable to get patient into car. Pt assisted onto ground by family. Pt normally wears o2 at 5L. VSS. Pt alert, oriented x4.

## 2018-09-06 NOTE — Progress Notes (Signed)
Inpatient Rehabilitation Admissions Coordinator  Inpatient rehab consult received. Await PT and OT evals to assist with planning dispo. Last admit SNF was recommended, but family declined SNF and d/c home arranged with [facility] transport.   Danne Baxter, RN, MSN Rehab Admissions Coordinator [phone] 09/06/2018 1:38 PM

## 2018-09-06 NOTE — ED Notes (Signed)
Pt gone to HD. 

## 2018-09-06 NOTE — ED Notes (Signed)
To CT with tech

## 2018-09-06 NOTE — Consult Note (Signed)
[facility] Renal Consultation Note    Indication for Consultation:  Management of ESRD/hemodialysis, anemia, hypertension/volume, and secondary hyperparathyroidism. PCP:  HPI: Joan Mosley is a 73 y.o. female with a history of ESRD on dialysis, subdural hematoma, DM, TIA and 5L home O2 requirement who presented to the ED on 09/06/2018 with weakness. She reportedly could not get into her car and collapsed on the ground. Patient was also admitted 7/27-/730 with encephalopathy due to subdural hematoma. She reportedly will not consider rehab. On presentation, BP 152/79, HR 88, T 97.7, SPO2 100%, K+ 5.4, BUN 74, WBC 11.8, Hgb 9.4. Not interested in surgery due to anesthesia risks. Neurosurgery recommended observation and repeat head CT due to history of subdural hematoma.   Patient attends dialysis TTS, last dialysis session was 09/01/2018. She was hypotensive at the end of treatment ands only received 1:55 out of prescribed 4 hours. Per notes, frequently misses and shortens treatments and is frequently significantly over her EDW. She is currently about 8kg over her EDW and reports shortness of breath frequently, however she denies SOB today. She is oxygen dependent at baseline. Denies CP, cough, palpitations, dizziness, abdominal pain, N/V/D. She does report weakness.   Past Medical History:  Diagnosis Date  . Arthritis   . COPD (chronic obstructive pulmonary disease) ([place])   . Diabetes mellitus without complication ([place])   . Diabetic neuropathy ([place])   . 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 Renal disorder    ESRD  . Thyroid disease   . TIA (transient ischemic attack)    Past Surgical History:  Procedure Laterality Date  . ABDOMINAL HYSTERECTOMY    . RECTOCELE REPAIR    . REPLACEMENT TOTAL KNEE BILATERAL Bilateral 2008  . THYROIDECTOMY     80%  . VAGINAL WOUND CLOSURE / REPAIR     Family History  Problem Relation Age of Onset  . Heart disease Mother   .  Hypertension Mother   . Heart disease Father   . Hypertension Father    Social History:  reports that she quit smoking about 37 years ago. She has a 35.00 pack-year smoking history. She has never used smokeless tobacco. She reports that she does not drink alcohol or use drugs.  ROS: As per HPI otherwise negative.   Physical Exam: Vitals:   09/06/18 0752 09/06/18 0753 09/06/18 0800 09/06/18 0930  BP: (!) 152/79  (!) 153/77   Pulse: 88  89 88  Resp: (!) 23  (!) 25 (!) 26  Temp: 97.7 F (36.5 C)     TempSrc: Oral     SpO2: 100%  99% 90%  Weight:  113.4 kg    Height:  5\' 1"  (1.549 m)       General: Well developed, chronically ill appearing female in NAD Head: Normocephalic, atraumatic, sclera non-icteric, mucus membranes are moist. Neck:  JVD not elevated. Lungs: Clear bilaterally to auscultation without wheezes, rales, or rhonchi. Breathing is unlabored. Heart: RRR with normal S1, S2. No murmurs, rubs, or gallops appreciated. Abdomen: Soft, non-tender, non-distended with normoactive bowel sounds. No rebound/guarding. No obvious abdominal masses. Musculoskeletal:  Strength and tone appear normal for age. Lower extremities: 1+ edema bilateral lower extremities Psych:  Gives brief answers to yes or no questions Dialysis Access: R IJ TDC no surrounding erythema/drainage  Allergies  Allergen Reactions  . Avelox [Moxifloxacin Hcl In Nacl]     PT does not remember  . Codeine Anaphylaxis    PT does not remember  .  Other     PT does not remember  . Penicillins     Rash  . Sulfa Antibiotics     PT does nolt remember  . Shellfish Allergy Hives, Itching and Rash   Prior to Admission medications   Medication Sig Start Date End Date Taking? Authorizing Provider  acetaminophen (TYLENOL) 500 MG tablet Take 1,000 mg by mouth every 6 (six) hours as needed for mild pain.    [provider]  albuterol (VENTOLIN HFA) 108 (90 Base) MCG/ACT inhaler Inhale 2 puffs into the lungs  every 6 (six) hours as needed for wheezing or shortness of breath.    [provider]  calcitRIOL (ROCALTROL) 0.25 MCG capsule Take 0.25 mcg by mouth daily.    [provider]  clonazePAM (KLONOPIN) 1 MG tablet Take 1 tablet (1 mg total) by mouth 2 (two) times daily as needed for anxiety. Change in dose 08/08/18   Nche, Charlene Brooke, NP  FLUoxetine (PROZAC) 10 MG tablet Take 1 tablet (10 mg total) by mouth daily. 08/08/18   Nche, Charlene Brooke, NP  furosemide (LASIX) 80 MG tablet Take 80 mg by mouth See admin instructions. Sunday, Monday,Wed,Friday--in AM     [provider]  insulin detemir (LEVEMIR) 100 UNIT/ML injection Inject 0.15 mLs (15 Units total) into the skin daily. 07/04/18   Nche, Charlene Brooke, NP  insulin lispro (HUMALOG) 100 UNIT/ML injection Inject 1-5 Units into the skin 3 (three) times daily with meals as needed for high blood sugar. Sliding scale     [provider]  ipratropium (ATROVENT) 0.02 % nebulizer solution Inhale 0.5 mg into the lungs 4 (four) times daily.     [provider]  levothyroxine (SYNTHROID) 112 MCG tablet Take 1 tablet (112 mcg total) by mouth daily before breakfast. 1 AM 07/04/18   Nche, Charlene Brooke, NP  lidocaine (LIDODERM) 5 % Place 1 patch onto the skin every 12 (twelve) hours as needed (back pain). Remove & Discard patch within 12 hours or as directed by MD     [provider]  midodrine (PROAMATINE) 10 MG tablet Take 10 mg by mouth 3 (three) times daily. Taking on Dialysis days, Tues, Thur, Sat.    [provider]  montelukast (SINGULAIR) 10 MG tablet Take 10 mg by mouth at bedtime. PM    [provider]  nystatin (MYCOSTATIN/NYSTOP) powder Apply 1 g topically 2 (two) times daily as needed (skin rash).     [provider]  nystatin cream (MYCOSTATIN) Apply 1 application topically 2 (two) times daily as needed for dry skin.     [provider]  rosuvastatin (CRESTOR) 40 MG  tablet Take 40 mg by mouth daily. AM    [provider]  senna-docusate (SENNA PLUS) 8.6-50 MG tablet Take 1 tablet by mouth at bedtime. 07/04/18   Nche, Charlene Brooke, NP  sevelamer (RENAGEL) 800 MG tablet Take 1,600 mg by mouth 3 (three) times daily with meals. 1600 mg --3 times daily--take additional 1-2 if eats snack    [provider]   Current Facility-Administered Medications  Medication Dose Route Frequency Provider Last Rate Last Dose  . acetaminophen (TYLENOL) tablet 650 mg  650 mg Oral Q6H PRN Karmen Bongo, MD       Or  . acetaminophen (TYLENOL) suppository 650 mg  650 mg Rectal Q6H PRN Karmen Bongo, MD      . albuterol (VENTOLIN HFA) 108 (90 Base) MCG/ACT inhaler 2 puff  2 puff Inhalation Q6H  PRN Karmen Bongo, MD      . calcitRIOL (ROCALTROL) capsule 0.25 mcg  0.25 mcg Oral Daily Karmen Bongo, MD      . calcium carbonate (dosed in mg elemental calcium) suspension 500 mg of elemental calcium  500 mg of elemental calcium Oral Q6H PRN Karmen Bongo, MD      . camphor-menthol [facility]) lotion 1 application  1 application Topical [id number] PRN Karmen Bongo, MD       And  . hydrOXYzine (ATARAX/VISTARIL) tablet 25 mg  25 mg Oral Q8H PRN Karmen Bongo, MD      . clonazePAM Bob[facility] [place]) tablet 1 mg  1 mg Oral BID PRN Karmen Bongo, MD      . docusate sodium [facility] enema 283 mg  1 enema Rectal PRN Karmen Bongo, MD      . feeding supplement (NEPRO CARB STEADY) liquid 237 mL  237 mL Oral TID PRN Karmen Bongo, MD      . FLUoxetine (PROZAC) tablet 10 mg  10 mg Oral Daily Karmen Bongo, MD      . furosemide (LASIX) tablet 80 mg  80 mg Oral See admin instructions Karmen Bongo, MD      . insulin aspart (novoLOG) injection 0-5 Units  0-5 Units Subcutaneous QHS Karmen Bongo, MD      . insulin aspart (novoLOG) injection 0-9 Units  0-9 Units Subcutaneous TID WC Karmen Bongo, MD      . insulin detemir (LEVEMIR) injection 15 Units  15 Units Subcutaneous Daily  Karmen Bongo, MD      . ipratropium (ATROVENT) nebulizer solution 0.5 mg  0.5 mg Inhalation QID Karmen Bongo, MD      . Derrill Memo ON 09/07/2018] levothyroxine (SYNTHROID) tablet 112 mcg  112 mcg Oral QAC breakfast Karmen Bongo, MD      . lidocaine (LIDODERM) 5 % 1 patch  1 patch Transdermal Q12H PRN Karmen Bongo, MD      . midodrine (PROAMATINE) tablet 10 mg  10 mg Oral TID Karmen Bongo, MD      . montelukast (SINGULAIR) tablet 10 mg  10 mg Oral Ivery Quale, MD      . ondansetron [facility]) tablet 4 mg  4 mg Oral Q6H PRN Karmen Bongo, MD       Or  . ondansetron [facility]) injection 4 mg  4 mg Intravenous Q6H PRN Karmen Bongo, MD      . rosuvastatin (CRESTOR) tablet 40 mg  40 mg Oral Daily Karmen Bongo, MD      . senna-docusate (Senokot-S) tablet 1 tablet  1 tablet Oral QHS Karmen Bongo, MD      . sevelamer carbonate (RENVELA) tablet 800 mg  800 mg Oral TID WC Karmen Bongo, MD      . sorbitol 70 % solution 30 mL  30 mL Oral PRN Karmen Bongo, MD      . zolpidem Lorrin Mais) tablet 5 mg  5 mg Oral QHS PRN Karmen Bongo, MD       Current Outpatient Medications  Medication Sig Dispense Refill  . acetaminophen (TYLENOL) 500 MG tablet Take 1,000 mg by mouth every 6 (six) hours as needed for mild pain.    Marland Kitchen albuterol (VENTOLIN HFA) 108 (90 Base) MCG/ACT inhaler Inhale 2 puffs into the lungs every 6 (six) hours as needed for wheezing or shortness of breath.    . calcitRIOL (ROCALTROL) 0.25 MCG capsule Take 0.25 mcg by mouth daily.    . clonazePAM (KLONOPIN) 1 MG tablet Take 1 tablet (1 mg  total) by mouth 2 (two) times daily as needed for anxiety. Change in dose 60 tablet 2  . FLUoxetine (PROZAC) 10 MG tablet Take 1 tablet (10 mg total) by mouth daily. 30 tablet 5  . furosemide (LASIX) 80 MG tablet Take 80 mg by mouth See admin instructions. Sunday, Monday,Wed,Friday--in AM     . insulin detemir (LEVEMIR) 100 UNIT/ML injection Inject 0.15 mLs (15 Units total) into the skin  daily. 10 mL 2  . insulin lispro (HUMALOG) 100 UNIT/ML injection Inject 1-5 Units into the skin 3 (three) times daily with meals as needed for high blood sugar. Sliding scale     . ipratropium (ATROVENT) 0.02 % nebulizer solution Inhale 0.5 mg into the lungs 4 (four) times daily.     Marland Kitchen levothyroxine (SYNTHROID) 112 MCG tablet Take 1 tablet (112 mcg total) by mouth daily before breakfast. 1 AM 90 tablet 0  . lidocaine (LIDODERM) 5 % Place 1 patch onto the skin every 12 (twelve) hours as needed (back pain). Remove & Discard patch within 12 hours or as directed by MD     . midodrine (PROAMATINE) 10 MG tablet Take 10 mg by mouth 3 (three) times daily. Taking on Dialysis days, Tues, Thur, Sat.    . montelukast (SINGULAIR) 10 MG tablet Take 10 mg by mouth at bedtime. PM    . nystatin (MYCOSTATIN/NYSTOP) powder Apply 1 g topically 2 (two) times daily as needed (skin rash).     . nystatin cream (MYCOSTATIN) Apply 1 application topically 2 (two) times daily as needed for dry skin.     . rosuvastatin (CRESTOR) 40 MG tablet Take 40 mg by mouth daily. AM    . senna-docusate (SENNA PLUS) 8.6-50 MG tablet Take 1 tablet by mouth at bedtime. 90 tablet 1  . sevelamer (RENAGEL) 800 MG tablet Take 1,600 mg by mouth 3 (three) times daily with meals. 1600 mg --3 times daily--take additional 1-2 if eats snack     Labs: Basic Metabolic Panel: Recent Labs  Lab 09/06/18 0744  NA 135  K 5.4*  CL 92*  CO2 24  GLUCOSE 237*  BUN 74*  CREATININE 13.46*  CALCIUM 8.7*   CBC: Recent Labs  Lab 09/06/18 0744  WBC 11.8*  NEUTROABS 9.4*  HGB 9.4*  HCT 30.9*  MCV 96.6  PLT 171   CBG: Recent Labs  Lab 09/06/18 0754  GLUCAP 176*   Studies/Results: Ct Head Wo Contrast  Result Date: 09/06/2018 CLINICAL DATA:  Altered mental status and fatigue. Recent subdural hematomas EXAM: CT HEAD WITHOUT CONTRAST TECHNIQUE: Contiguous axial images were obtained from the base of the skull through the vertex without  intravenous contrast. COMPARISON:  August 25, 2018 FINDINGS: Brain: There is age related volume loss. There remains a subdural hematoma in the right frontal region with both acute and more chronic appearing fluid. There remains a degree of mass effect on the right frontal lobe, slightly less than on the previous study. The current maximum thickness of the subdural hematoma on the right measures 1.4 cm compared to a maximum thickness of 1.6 cm. There is a subdural hematoma on the left in the frontal and parietal regions which also has mixed attenuation consistent with both relatively acute and more subacute to chronic hemorrhage. The maximum thickness of this subdural hematoma measures 1.3 cm compared to 1.6 cm on most recent study. No new hemorrhage or new extra-axial fluid collections are evident. There is no intra-axial mass or hemorrhage. Currently there is 2 mm of  midline shift to the right compared to 5 mm of midline shift to the right on most recent study. There is small vessel disease in the centra semiovale bilaterally. No acute infarct is appreciable. Vascular: No hyperdense vessels are evident. There is calcification in each carotid siphon region and distal left vertebral artery. Skull: The bony calvarium appears intact. Sinuses/Orbits: There is opacification throughout the right maxillary antrum. There are more scattered areas of opacification in the left maxillary antrum with probable polyp superiorly. There is mucosal thickening in several ethmoid air cells as well as in the right frontal sinus region. Orbits appear symmetric bilaterally. Other: There is opacification of several inferior mastoid air cells bilaterally, stable. IMPRESSION: 1. There remain bilateral subdural hematomas with both more acute and more chronic appearing fluid on each side. The right subdural hematoma is seen in the right frontal region with mild mass effect on the right frontal lobe. This subdural hematoma at its maximum measures  1.4 cm compared to a thickness of 1.6 cm on the previous study. On the left, the subdural hematoma is slightly larger and involves frontal and parietal regions. The mixed attenuation in the fluid remains stable. The maximum thickness on the left currently measures 1.3 cm compared to 1.6 cm on the previous study. There appears to be slightly less mass effect from the subdural hematomas compared to recent prior study. There is 2 mm of midline shift to the right compared to 5 mm of midline shift to the right on the previous study. 2. No intra-axial hemorrhage. There is patchy periventricular small vessel disease. No acute infarct evident. 3.  Foci of arterial vascular calcification noted. 4. Multifocal paranasal sinus disease again noted. Mild inferior mastoid disease bilaterally noted. Electronically Signed   By: Lowella Grip III M.D.   On: 09/06/2018 08:43    Dialysis Orders: Center: [facility] on TTS. T: 4 hr; 180NRe; DFR 400/DFR auto 1.5; EDW 105.5kg, 2K/2Ca No heparin bolus Calcitriol 1.5 mcg PO qHD Sensipar 60 mg PO qHD renvela 800mg  3 tabs AC Midodrine 10 mg PO TIW before HD and mid treatment for SBP <95  Assessment/Plan: 1.  Subdural hematoma: Neurosurgery following, recommended monitoring and repeat head CT. She reportedly has not desired surgery due to anesthesia risks associated with COPD. No heparin with dialysis.  2.  ESRD:  TTS schedule. K+ 5.5. Volume overloaded on exam. Will plan HD today per TTS schedule. Shortening treatments may be contributing to hyperkalemia.  3.  Hypertension/volume: BP moderately elevated and has edema on exam. 8kg above her EDW. She does appear to have some hypotension with dialysis as an outpatient and does get midodrine pre-HD. Will attempt UFG 3-3.5L as tolerated. 1.2L fluid restriction. Encourage compliance with full HD treatment. Continbue lasix on non-dialysis days 4.  Anemia: Hemoglobin 9.4.  Not on ESA as an outpatient, will start  aranesp 36mcg with HD today.  5.  Metabolic bone disease: Calcium 8.7, Continue calcitriol and sensipar. Phos poorly controlled as an outpatient. Continue renvela. 6.  Nutrition:  Renal diet with fluid restrictions.  7. COPD: On 5L O2 [place] at baseline. Continue meds per primary. 8. Goals of care: Full code, receptive to palliative care consult 9. Diabetes mellitus: On insulin, per primary  Anice Paganini, PA-C 09/06/2018, 10:01 AM  [facility] Pager: [phone]

## 2018-09-06 NOTE — Progress Notes (Signed)
Pt arrived to 3W22 from ED/HD. Alert and oriented x4. 97% on 5L [place]. Complains of 8/10 generalized "head to toe" pain related to arthritis. Vitals obtained, telemetry verified.  Skin tear noted on the R lower leg, cleansed and new dressing applied. Bruising on L hip, and bilateral arms, and MASD in abdominal folds and under breasts. Stage 1 pressure ulcer on sacrum. Patient oriented to unit, call bell and phone within reach.

## 2018-09-06 NOTE — ED Provider Notes (Signed)
[facility] EMERGENCY DEPARTMENT Provider Note   CSN: [id number] Arrival date & time: 09/06/18  0731    History   Chief Complaint Chief Complaint  Patient presents with  . Weakness    HPI Joan Mosley is a 73 y.o. female.     HPI 73 year old female presents via ambulance for weakness.  She is wheelchair dependent.  Family was getting her into the car to take her to dialysis.  She is typically a Tuesday/Thursday/Saturday dialysis and went this past Saturday.  When trying to pivot from the wheelchair to the car, she did not have enough strength and fell down to the ground.  Landed on her buttocks but did not hit her head or lose consciousness.  No syncope/near syncope.  Based on her weakness and weight, family was unable to pick her up and EMS was called.  The patient states she is always weak and this is her typical weakness.  She is oxygen dependent and has a chronic cough and chronic dyspnea, both of which are unchanged.  There is no chest pain.  No headaches.  Past Medical History:  Diagnosis Date  . Arthritis   . COPD (chronic obstructive pulmonary disease) ([place])   . Diabetes mellitus without complication ([place])   . Diabetic neuropathy ([place])   . 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 Renal disorder    ESRD  . Thyroid disease   . TIA (transient ischemic attack)     Patient Active Problem List   Diagnosis Date Noted  . SDH (subdural hematoma) ([place]) 09/06/2018  . Chronic respiratory failure with hypoxia, on home O2 therapy ([place]) 09/06/2018  . Subdural hematoma ([place]) 08/22/2018  . Elevated troponin 08/22/2018  . ESRD (end stage renal disease) ([place]) 08/22/2018  . Anxiety 07/04/2018  . Counseling regarding advanced directives and goals of care 07/01/2018  . Full code status 07/01/2018  . Drug-induced constipation 07/01/2018  . Hemodialysis-associated hypotension 07/01/2018  . Dialysis patient ([place]) 07/01/2018  . HOH (hard of hearing) 07/01/2018   . Stage 5 chronic kidney disease on chronic dialysis ([place]) 07/01/2018  . Type 2 diabetes mellitus with diabetic polyneuropathy, with long-term current use of insulin ([place]) 07/01/2018  . Hypothyroidism 07/01/2018  . Gait instability 07/01/2018  . Hyperkalemia 04/20/2018    Past Surgical History:  Procedure Laterality Date  . ABDOMINAL HYSTERECTOMY    . RECTOCELE REPAIR    . REPLACEMENT TOTAL KNEE BILATERAL Bilateral 2008  . THYROIDECTOMY     80%  . VAGINAL WOUND CLOSURE / REPAIR       OB History   No obstetric history on file.      Home Medications    Prior to Admission medications   Medication Sig Start Date End Date Taking? Authorizing Provider  acetaminophen (TYLENOL) 500 MG tablet Take 1,000 mg by mouth every 6 (six) hours as needed for mild pain.   Yes [provider]  albuterol (VENTOLIN HFA) 108 (90 Base) MCG/ACT inhaler Inhale 2 puffs into the lungs every 6 (six) hours as needed for wheezing or shortness of breath.   Yes [provider]  calcitRIOL (ROCALTROL) 0.25 MCG capsule Take 0.25 mcg by mouth daily.   Yes [provider]  clonazePAM (KLONOPIN) 1 MG tablet Take 1 tablet (1 mg total) by mouth 2 (two) times daily as needed for anxiety. Change in dose 08/08/18  Yes Nche, Charlene Brooke, NP  FLUoxetine (PROZAC) 10 MG tablet Take 1 tablet (10 mg total) by mouth  daily. 08/08/18  Yes Nche, Charlene Brooke, NP  furosemide (LASIX) 80 MG tablet Take 80 mg by mouth See admin instructions. Sunday, Monday,Wed,Friday--in AM    Yes [provider]  insulin detemir (LEVEMIR) 100 UNIT/ML injection Inject 0.15 mLs (15 Units total) into the skin daily. 07/04/18  Yes Nche, Charlene Brooke, NP  insulin lispro (HUMALOG) 100 UNIT/ML injection Inject 1-5 Units into the skin 3 (three) times daily with meals as needed for high blood sugar. Sliding scale    Yes [provider]  ipratropium (ATROVENT) 0.02 % nebulizer solution Inhale 0.5 mg into the lungs 4  (four) times daily.    Yes [provider]  levothyroxine (SYNTHROID) 112 MCG tablet Take 1 tablet (112 mcg total) by mouth daily before breakfast. 1 AM 07/04/18  Yes Nche, Charlene Brooke, NP  lidocaine (LIDODERM) 5 % Place 1 patch onto the skin every 12 (twelve) hours as needed (back pain). Remove & Discard patch within 12 hours or as directed by MD    Yes [provider]  midodrine (PROAMATINE) 10 MG tablet Take 10 mg by mouth 3 (three) times daily. Taking on Dialysis days, Tues, Thur, Sat.   Yes [provider]  montelukast (SINGULAIR) 10 MG tablet Take 10 mg by mouth at bedtime. PM   Yes [provider]  nystatin (MYCOSTATIN/NYSTOP) powder Apply 1 g topically 2 (two) times daily as needed (skin rash).    Yes [provider]  nystatin cream (MYCOSTATIN) Apply 1 application topically 2 (two) times daily as needed for dry skin.    Yes [provider]  rosuvastatin (CRESTOR) 40 MG tablet Take 40 mg by mouth daily. AM   Yes [provider]  senna-docusate (SENNA PLUS) 8.6-50 MG tablet Take 1 tablet by mouth at bedtime. 07/04/18  Yes Nche, Charlene Brooke, NP  sevelamer (RENAGEL) 800 MG tablet Take 1,600 mg by mouth 3 (three) times daily with meals. 1600 mg --3 times daily--take additional 1-2 if eats snack   Yes [provider]    Family History Family History  Problem Relation Age of Onset  . Heart disease Mother   . Hypertension Mother   . Heart disease Father   . Hypertension Father     Social History Social History   Tobacco Use  . Smoking status: Former Smoker    Packs/day: 1.00    Years: 35.00    Pack years: 35.00    Quit date: 03/29/1981    Years since quitting: 37.4  . Smokeless tobacco: Never Used  Substance Use Topics  . Alcohol use: Never    Frequency: Never  . Drug use: Never     Allergies   Avelox [moxifloxacin hcl in nacl], Codeine, Other, Penicillins, Sulfa antibiotics, and Shellfish allergy    Review of Systems Review of Systems  Constitutional: Negative for fever.  Respiratory: Positive for shortness of breath (chronic and baseline, not worse than typical).   Cardiovascular: Positive for leg swelling (chronic, unchanged). Negative for chest pain.  Gastrointestinal: Negative for abdominal pain, diarrhea and vomiting.  Genitourinary: Negative for dysuria.  Neurological: Positive for weakness. Negative for headaches.  All other systems reviewed and are negative.    Physical Exam Updated Vital Signs BP (!) 155/52   Pulse 97   Temp 97.6 F (36.4 C) (Oral)   Resp (!) 26   Ht 5\' 1"  (1.549 m)   Wt 113.4 kg   SpO2 97%   BMI 47.24 kg/m   Physical Exam Vitals signs and  nursing note reviewed.  Constitutional:      General: She is not in acute distress.    Appearance: She is well-developed. She is obese.  HENT:     Head: Normocephalic and atraumatic.     Right Ear: External ear normal.     Left Ear: External ear normal.     Nose: Nose normal.  Eyes:     General:        Right eye: No discharge.        Left eye: No discharge.  Cardiovascular:     Rate and Rhythm: Normal rate and regular rhythm.     Heart sounds: Normal heart sounds.  Pulmonary:     Effort: Pulmonary effort is normal.  Abdominal:     Palpations: Abdomen is soft.     Tenderness: There is no abdominal tenderness.  Musculoskeletal:     Lumbar back: She exhibits tenderness.       Back:  Skin:    General: Skin is warm and dry.  Neurological:     Mental Status: She is alert.     Comments: No slurred speech or facial droop. 5/5 strength in BUE. 3/5 strength in BLE. Normal finger to nose.  Psychiatric:        Mood and Affect: Mood is not anxious.      ED Treatments / Results  Labs (all labs ordered are listed, but only abnormal results are displayed) Labs Reviewed  BASIC METABOLIC PANEL - Abnormal; Notable for the following components:      Result Value   Potassium 5.4 (*)    Chloride 92 (*)     Glucose, Bld 237 (*)    BUN 74 (*)    Creatinine, Ser 13.46 (*)    Calcium 8.7 (*)    GFR calc non Af Amer 2 (*)    GFR calc Af Amer 3 (*)    Anion gap 19 (*)    All other components within normal limits  CBC WITH DIFFERENTIAL/PLATELET - Abnormal; Notable for the following components:   WBC 11.8 (*)    RBC 3.20 (*)    Hemoglobin 9.4 (*)    HCT 30.9 (*)    Neutro Abs 9.4 (*)    All other components within normal limits  CBG MONITORING, ED - Abnormal; Notable for the following components:   Glucose-Capillary 176 (*)    All other components within normal limits  CBG MONITORING, ED - Abnormal; Notable for the following components:   Glucose-Capillary 128 (*)    All other components within normal limits  SARS CORONAVIRUS 2    EKG EKG Interpretation  Date/Time:  Tuesday September 06 2018 07:49:36 EDT Ventricular Rate:  92 PR Interval:    QRS Duration: 97 QT Interval:  318 QTC Calculation: 394 R Axis:   36 Text Interpretation:  Sinus rhythm Prolonged PR interval Borderline T wave abnormalities Baseline wander in lead(s) V1 T wave changes similar to July 2020 Confirmed by Sherwood Gambler [phone]) on 09/06/2018 7:52:29 AM Also confirmed by Sherwood Gambler [phone]), editor Philomena Doheny [phone])  on 09/06/2018 8:03:57 AM   Radiology Ct Head Wo Contrast  Result Date: 09/06/2018 CLINICAL DATA:  Altered mental status and fatigue. Recent subdural hematomas EXAM: CT HEAD WITHOUT CONTRAST TECHNIQUE: Contiguous axial images were obtained from the base of the skull through the vertex without intravenous contrast. COMPARISON:  August 25, 2018 FINDINGS: Brain: There is age related volume loss. There remains a subdural hematoma in the right frontal region with both  acute and more chronic appearing fluid. There remains a degree of mass effect on the right frontal lobe, slightly less than on the previous study. The current maximum thickness of the subdural hematoma on the right measures 1.4 cm compared to  a maximum thickness of 1.6 cm. There is a subdural hematoma on the left in the frontal and parietal regions which also has mixed attenuation consistent with both relatively acute and more subacute to chronic hemorrhage. The maximum thickness of this subdural hematoma measures 1.3 cm compared to 1.6 cm on most recent study. No new hemorrhage or new extra-axial fluid collections are evident. There is no intra-axial mass or hemorrhage. Currently there is 2 mm of midline shift to the right compared to 5 mm of midline shift to the right on most recent study. There is small vessel disease in the centra semiovale bilaterally. No acute infarct is appreciable. Vascular: No hyperdense vessels are evident. There is calcification in each carotid siphon region and distal left vertebral artery. Skull: The bony calvarium appears intact. Sinuses/Orbits: There is opacification throughout the right maxillary antrum. There are more scattered areas of opacification in the left maxillary antrum with probable polyp superiorly. There is mucosal thickening in several ethmoid air cells as well as in the right frontal sinus region. Orbits appear symmetric bilaterally. Other: There is opacification of several inferior mastoid air cells bilaterally, stable. IMPRESSION: 1. There remain bilateral subdural hematomas with both more acute and more chronic appearing fluid on each side. The right subdural hematoma is seen in the right frontal region with mild mass effect on the right frontal lobe. This subdural hematoma at its maximum measures 1.4 cm compared to a thickness of 1.6 cm on the previous study. On the left, the subdural hematoma is slightly larger and involves frontal and parietal regions. The mixed attenuation in the fluid remains stable. The maximum thickness on the left currently measures 1.3 cm compared to 1.6 cm on the previous study. There appears to be slightly less mass effect from the subdural hematomas compared to recent prior  study. There is 2 mm of midline shift to the right compared to 5 mm of midline shift to the right on the previous study. 2. No intra-axial hemorrhage. There is patchy periventricular small vessel disease. No acute infarct evident. 3.  Foci of arterial vascular calcification noted. 4. Multifocal paranasal sinus disease again noted. Mild inferior mastoid disease bilaterally noted. Electronically Signed   By: Lowella Grip III M.D.   On: 09/06/2018 08:43    Procedures Procedures (including critical care time)  Medications Ordered in ED Medications  furosemide (LASIX) tablet 80 mg (has no administration in time range)  midodrine (PROAMATINE) tablet 10 mg (10 mg Oral Given 09/06/18 1414)  rosuvastatin (CRESTOR) tablet 40 mg (has no administration in time range)  FLUoxetine (PROZAC) tablet 10 mg (has no administration in time range)  insulin detemir (LEVEMIR) injection 15 Units (has no administration in time range)  levothyroxine (SYNTHROID) tablet 112 mcg (has no administration in time range)  senna-docusate (Senokot-S) tablet 1 tablet (has no administration in time range)  clonazePAM (KLONOPIN) tablet 1 mg (has no administration in time range)  albuterol (PROVENTIL) (2.5 MG/3ML) 0.083% nebulizer solution 3 mL (has no administration in time range)  ipratropium (ATROVENT) nebulizer solution 0.5 mg (has no administration in time range)  montelukast (SINGULAIR) tablet 10 mg (has no administration in time range)  lidocaine (LIDODERM) 5 % 1 patch (has no administration in time range)  acetaminophen (TYLENOL) tablet  650 mg (has no administration in time range)    Or  acetaminophen (TYLENOL) suppository 650 mg (has no administration in time range)  zolpidem (AMBIEN) tablet 5 mg (has no administration in time range)  sorbitol 70 % solution 30 mL (has no administration in time range)  docusate sodium (ENEMEEZ) enema 283 mg (has no administration in time range)  ondansetron (ZOFRAN) tablet 4 mg (has no  administration in time range)    Or  ondansetron (ZOFRAN) injection 4 mg (has no administration in time range)  camphor-menthol (SARNA) lotion 1 application (has no administration in time range)    And  hydrOXYzine (ATARAX/VISTARIL) tablet 25 mg (has no administration in time range)  calcium carbonate (dosed in mg elemental calcium) suspension 500 mg of elemental calcium (has no administration in time range)  feeding supplement (NEPRO CARB STEADY) liquid 237 mL (has no administration in time range)  insulin aspart (novoLOG) injection 0-9 Units (1 Units Subcutaneous Given 09/06/18 1159)  insulin aspart (novoLOG) injection 0-5 Units (has no administration in time range)  Chlorhexidine Gluconate Cloth 2 % PADS 6 each (has no administration in time range)  calcitRIOL (ROCALTROL) capsule 1.5 mcg (1.5 mcg Oral Given 09/06/18 1416)  cinacalcet (SENSIPAR) tablet 60 mg (has no administration in time range)  sevelamer carbonate (RENVELA) tablet 2,400 mg (has no administration in time range)  midodrine (PROAMATINE) 5 MG tablet (has no administration in time range)  calcitRIOL (ROCALTROL) 0.5 MCG capsule (  Not Given 09/06/18 1424)  heparin injection 3,700 Units (has no administration in time range)     Initial Impression / Assessment and Plan / ED Course  I have reviewed the triage vital signs and the nursing notes.  Pertinent labs & imaging results that were available during my care of the patient were reviewed by me and considered in my medical decision making (see chart for details).        Unclear if the patient's weakness is related to the subdural, but CT does show an acute component in there.  I discussed with Dr. Venetia Constable, who advises overnight ops with hospitalist and CT PRN.  Also discussed with nephrology who will dialyze.  Dr. Lorin Mercy will admit.  Final Clinical Impressions(s) / ED Diagnoses   Final diagnoses:  Subdural hematoma [facility])    ED Discharge Orders    None        Sherwood Gambler, MD 09/06/18 1549

## 2018-09-06 NOTE — ED Notes (Signed)
Diet was ordered for Lunch. 

## 2018-09-07 ENCOUNTER — Telehealth: Payer: Self-pay | Admitting: Nurse Practitioner

## 2018-09-07 ENCOUNTER — Inpatient Hospital Stay: Payer: Medicare Other | Admitting: Nurse Practitioner

## 2018-09-07 DIAGNOSIS — I953 Hypotension of hemodialysis: Secondary | ICD-10-CM | POA: Diagnosis not present

## 2018-09-07 DIAGNOSIS — S065X9A Traumatic subdural hemorrhage with loss of consciousness of unspecified duration, initial encounter: Secondary | ICD-10-CM | POA: Diagnosis not present

## 2018-09-07 DIAGNOSIS — J9611 Chronic respiratory failure with hypoxia: Secondary | ICD-10-CM | POA: Diagnosis not present

## 2018-09-07 DIAGNOSIS — Z515 Encounter for palliative care: Secondary | ICD-10-CM

## 2018-09-07 DIAGNOSIS — Z7189 Other specified counseling: Secondary | ICD-10-CM | POA: Diagnosis not present

## 2018-09-07 DIAGNOSIS — Z9981 Dependence on supplemental oxygen: Secondary | ICD-10-CM

## 2018-09-07 LAB — CBC
HCT: 30 % — ABNORMAL LOW (ref 36.0–46.0)
Hemoglobin: 9.2 g/dL — ABNORMAL LOW (ref 12.0–15.0)
MCH: 29.8 pg (ref 26.0–34.0)
MCHC: 30.7 g/dL (ref 30.0–36.0)
MCV: 97.1 fL (ref 80.0–100.0)
Platelets: 161 10*3/uL (ref 150–400)
RBC: 3.09 MIL/uL — ABNORMAL LOW (ref 3.87–5.11)
RDW: 15.7 % — ABNORMAL HIGH (ref 11.5–15.5)
WBC: 10.1 10*3/uL (ref 4.0–10.5)
nRBC: 0 % (ref 0.0–0.2)

## 2018-09-07 LAB — BASIC METABOLIC PANEL
Anion gap: 13 (ref 5–15)
BUN: 32 mg/dL — ABNORMAL HIGH (ref 8–23)
CO2: 27 mmol/L (ref 22–32)
Calcium: 9 mg/dL (ref 8.9–10.3)
Chloride: 95 mmol/L — ABNORMAL LOW (ref 98–111)
Creatinine, Ser: 7.69 mg/dL — ABNORMAL HIGH (ref 0.44–1.00)
GFR calc Af Amer: 5 mL/min — ABNORMAL LOW (ref >60)
GFR calc non Af Amer: 5 mL/min — ABNORMAL LOW (ref >60)
Glucose, Bld: 118 mg/dL — ABNORMAL HIGH (ref 70–99)
Potassium: 4.8 mmol/L (ref 3.5–5.1)
Sodium: 135 mmol/L (ref 135–145)

## 2018-09-07 LAB — GLUCOSE, CAPILLARY: Glucose-Capillary: 122 mg/dL — ABNORMAL HIGH (ref 70–99)

## 2018-09-07 NOTE — Telephone Encounter (Signed)
FYI  Copied from [place] [phone]. Topic: General - Other >> Sep 07, 2018 12:55 PM Leward Quan A wrote: Reason for CRM:  Gar Ponto with [facility] called to inform Wilfred Lacy that patient was discharged from the hospital today and was referred to Palliative care. She will not be following patient care but she will be followed by Judie Grieve Ph# [phone]

## 2018-09-07 NOTE — Consult Note (Signed)
Consultation Note Date: 09/07/2018   Patient Name: Naomee Nowland  DOB: 03-13-1945  MRN: [id number]  Age / Sex: 73 y.o., female   PCP: Nche, Charlene Brooke, NP Referring Physician: Damita Lack, MD   REASON FOR CONSULTATION:Establishing goals of care  Palliative Care consult requested for goals of care in this 73 y.o. female with multiple medical problems including TIA; COPD (5L home oxygen), ESRD on TTS HD, morbid obesity (BMI 47), arthritis, diabetic neuropathy, and diabetes. She presented to ED s/p witnessed fall after attempting to get into the car. On exam patient denied loss of consciousness reporting she was unable to lift her leg. She was recently admitted on 7/27-7/30/2020 with acute metabolic encephalopathy  Secondary to subdural hematoma. Neurosurgery recommended observation. Patient being evaluated by PT and is also being followed by Nephrology for continued HD.   Clinical Assessment and Goals of Care: I have reviewed medical records including lab results, imaging, Epic notes, and MAR, received report from the bedside RN, and assessed the patient. I met at the bedside with patient  to discuss diagnosis prognosis, GOC, EOL wishes, disposition and options.  Ms. Espy is awake, alert and oriented x3.  Denies pain but does endorse shortness of breath at times.  I introduced Palliative Medicine as specialized medical care for people living with serious illness. It focuses on providing relief from the symptoms and stress of a serious illness. The goal is to improve quality of life for both the patient and the family.  Patient states she is familiar with similar services and feels that she was under this type of care when she resided up [place].  We discussed a brief life review of the patient, along with her functional and nutritional status.   Ms. Brents states that she lives with her daughter, son, and 24 year old grandson.  She has 5 children (2 lives in [place], 2 lives in  the home, and one is deceased).  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  She reports her and her family recently moved from up [place] about 4 months ago with anticipation of relocating to [place] to warmer weather.  Patient states unfortunately they have not been able to make it to [place] due to her illness and frequent hospitalizations.  They have now found a place and is renting locally until hopefully her medical condition can become more stable and they can proceed with their trip to [place].  Prior to admission she required the use of home oxygen (5L).  She reports her family provides 24/7 care as she has not ambulated in more than 3 years due to severe arthritis and weak knees.  States she had high risk of falls but does have the ability to stand turn and pivot for transfer.  In-home she reports using an electric wheelchair, regular wheelchair, hospital bed, and oxygen concentrator.  She reports she is in the process of obtaining a neurologist and pulmonologist but has established a PCP.  Patient reports she has experienced multiple falls over the past month and increase in generalized weakness.  We discussed Her current illness and what it means in the larger context of Her on-going co-morbidities. With specific discussions regarding frequent hospitalizations, SDH, ESRD, falls, chronic respiratory failure/COPD, and her overall functional state.  Natural disease trajectory and expectations at EOL were discussed.  Patient verbalized understanding of her chronic comorbidities and current illness.  She verbalizes awareness of her declining health and frequent hospitalizations.  She is tearful and  expressing "this is why we were trying to move to [place] because my pulmonologist stated it may be the warm weather will help my condition and quality of life."  I attempted to elicit values and goals of care important to the patient.    The difference between aggressive medical intervention and  comfort care was considered in light of the patient's goals of care.  Patient states she is aware of her medical condition and notes that eventually her health will continue to decline to the point of death, but she remains hopeful for some stability and at least another year or 2 to spend with her family.  Advanced directives, concepts specific to code status, artifical feeding and hydration, and rehospitalization were considered and discussed. Patient states she does have a documented advanced directive however, she needs to make some updates to her documents at some point. Her daughter, Elois Averitt is her expressed HCPOA.   We discussed patient's full code status with consideration to her current illness and comorbidities.  Patient verbalized her wishes to remain a full code with full aggressive medical interventions.  I educated patient on what a potential code would look like in the event of a cardiopulmonary emergency with consideration to her comorbidities.  Patient verbalized understanding and again expressed "I am not ready to go or give up just yet.  If something like this does happen my children knows my wishes and will make the best decisions at that point based on my expected recovery."  Hospice and Palliative Care services outpatient were explained and offered. Patient and  verbalized her understanding and awareness of both palliative and hospice's goals and philosophy of care.  Given patient's expressed wishes to remain a full code and full aggressive medical interventions recommendations for outpatient palliative support was offered.  Patient verbalized understanding and expressed she had similar services when she lived in [place] and would like to continue with home support.  Questions and concerns were addressed.  Hard Choices booklet left for review.  Patient was encouraged to call with questions or concerns.  PMT will continue to support holistically.   SOCIAL HISTORY:     reports  that she quit smoking about 37 years ago. She has a 35.00 pack-year smoking history. She has never used smokeless tobacco. She reports that she does not drink alcohol or use drugs.  CODE STATUS: Full code  ADVANCE DIRECTIVES: Sam Wunschel (daughter)   SYMPTOM MANAGEMENT: Per attending  Palliative Prophylaxis:   Aspiration, Bowel Regimen, Frequent Pain Assessment and Oral Care  PSYCHO-SOCIAL/SPIRITUAL:  Support System: Family  Desire for further Chaplaincy support: No  Additional Recommendations (Limitations, Scope, Preferences):  Full Scope Treatment   PAST MEDICAL HISTORY: Past Medical History:  Diagnosis Date  . Arthritis   . COPD (chronic obstructive pulmonary disease) ([place])   . Diabetes mellitus without complication ([place])   . Diabetic neuropathy ([place])   . 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 Renal disorder    ESRD  . Thyroid disease   . TIA (transient ischemic attack)     PAST SURGICAL HISTORY:  Past Surgical History:  Procedure Laterality Date  . ABDOMINAL HYSTERECTOMY    . RECTOCELE REPAIR    . REPLACEMENT TOTAL KNEE BILATERAL Bilateral 2008  . THYROIDECTOMY     80%  . VAGINAL WOUND CLOSURE / REPAIR      ALLERGIES:  is allergic to avelox [moxifloxacin hcl in nacl]; codeine; other; penicillins; sulfa antibiotics; and shellfish allergy.  MEDICATIONS:  Current Facility-Administered Medications  Medication Dose Route Frequency Provider Last Rate Last Dose  . acetaminophen (TYLENOL) tablet 650 mg  650 mg Oral Q6H PRN Karmen Bongo, MD       Or  . acetaminophen (TYLENOL) suppository 650 mg  650 mg Rectal Q6H PRN Karmen Bongo, MD      . albuterol (PROVENTIL) (2.5 MG/3ML) 0.083% nebulizer solution 3 mL  3 mL Inhalation Q6H PRN Karmen Bongo, MD      . calcitRIOL (ROCALTROL) capsule 1.5 mcg  1.5 mcg Oral Q T,Th,Sa-HD Collins, Samantha G, PA-C   1.5 mcg at 09/06/18 1416  . calcium carbonate (dosed in mg elemental calcium) suspension 500 mg of  elemental calcium  500 mg of elemental calcium Oral Q6H PRN Karmen Bongo, MD      . camphor-menthol [facility]) lotion 1 application  1 application Topical D9M PRN Karmen Bongo, MD       And  . hydrOXYzine (ATARAX/VISTARIL) tablet 25 mg  25 mg Oral Q8H PRN Karmen Bongo, MD      . Chlorhexidine Gluconate Cloth 2 % PADS 6 each  6 each Topical Q0600 Janalee Dane, PA-C   6 each at 09/07/18 [phone]  . cinacalcet (SENSIPAR) tablet 60 mg  60 mg Oral Q T,Th,Sa-HD Collins, Samantha G, PA-C      . clonazePAM (KLONOPIN) tablet 1 mg  1 mg Oral BID PRN Karmen Bongo, MD      . docusate sodium [facility]) enema 283 mg  1 enema Rectal PRN Karmen Bongo, MD      . feeding supplement (NEPRO CARB STEADY) liquid 237 mL  237 mL Oral TID PRN Karmen Bongo, MD      . FLUoxetine (PROZAC) capsule 10 mg  10 mg Oral Daily Karmen Bongo, MD   10 mg at 09/07/18 0959  . furosemide (LASIX) tablet 80 mg  80 mg Oral Once per day on Sun Mon Wed Warren Danes, MD   80 mg at 09/07/18 1004  . insulin aspart (novoLOG) injection 0-5 Units  0-5 Units Subcutaneous QHS Karmen Bongo, MD      . insulin aspart (novoLOG) injection 0-9 Units  0-9 Units Subcutaneous TID WC Karmen Bongo, MD   1 Units at 09/07/18 0700  . insulin detemir (LEVEMIR) injection 15 Units  15 Units Subcutaneous Daily Karmen Bongo, MD   15 Units at 09/07/18 [phone]  . ipratropium (ATROVENT) nebulizer solution 0.5 mg  0.5 mg Inhalation QID Karmen Bongo, MD      . levothyroxine (SYNTHROID) tablet 112 mcg  112 mcg Oral QAC breakfast Karmen Bongo, MD   112 mcg at 09/07/18 0631  . lidocaine (LIDODERM) 5 % 1 patch  1 patch Transdermal Q12H PRN Karmen Bongo, MD      . Derrill Memo ON 09/08/2018] midodrine (PROAMATINE) tablet 10 mg  10 mg Oral 3 times per day on Tue Thu Sat Karmen Bongo, MD      . montelukast (SINGULAIR) tablet 10 mg  10 mg Oral Ivery Quale, MD   10 mg at 09/06/18 2232  . ondansetron (ZOFRAN) tablet 4 mg  4 mg Oral Q6H  PRN Karmen Bongo, MD       Or  . ondansetron [facility]) injection 4 mg  4 mg Intravenous Q6H PRN Karmen Bongo, MD   4 mg at 09/06/18 1614  . rosuvastatin (CRESTOR) tablet 40 mg  40 mg Oral q1800 Karmen Bongo, MD   40 mg at 09/06/18 1840  . senna-docusate (Senokot-S) tablet 1 tablet  1 tablet Oral Ivery Quale, MD   1 tablet at 09/06/18 2231  . sevelamer carbonate (RENVELA) tablet 2,400 mg  2,400 mg Oral TID WC Collins, Samantha G, PA-C   2,400 mg at 09/07/18 0756  . sorbitol 70 % solution 30 mL  30 mL Oral PRN Karmen Bongo, MD      . zolpidem [facility]) tablet 5 mg  5 mg Oral QHS PRN Karmen Bongo, MD        VITAL SIGNS: BP 121/64 (BP Location: Left Arm)   Pulse 98   Temp 98.2 F (36.8 C) (Oral)   Resp 20   Ht 5' 1"  (1.549 m)   Wt 112.5 kg   SpO2 93%   BMI 46.86 kg/m  Filed Weights   09/06/18 0753 09/07/18 0500  Weight: 113.4 kg 112.5 kg    Estimated body mass index is 46.86 kg/m as calculated from the following:   Height as of this encounter: 5' 1"  (1.549 m).   Weight as of this encounter: 112.5 kg.  LABS: CBC:    Component Value Date/Time   WBC 10.1 09/07/2018 0353   HGB 9.2 (L) 09/07/2018 0353   HCT 30.0 (L) 09/07/2018 0353   PLT 161 09/07/2018 0353   Comprehensive Metabolic Panel:    Component Value Date/Time   NA 135 09/07/2018 0353   K 4.8 09/07/2018 0353   CO2 27 09/07/2018 0353   BUN 32 (H) 09/07/2018 0353   CREATININE 7.69 (H) 09/07/2018 0353   CREATININE 7.20 (H) 07/01/2018 1149   ALBUMIN 3.2 (L) 08/25/2018 0358     Review of Systems  Constitutional: Positive for fatigue.  Respiratory: Positive for shortness of breath.   Neurological: Positive for weakness.   Unless otherwise noted, a complete review of systems is negative.  Physical Exam General: NAD, chronically ill-appearing, morbidly obese  Cardiovascular: regular rate and rhythm Pulmonary: 5L nasal cannula, rhonchi Abdomen: soft, nontender, + bowel sounds Extremities: no  edema, no joint deformities Skin: no rashes Neurological: Weakness but otherwise nonfocal, awake, alert, oriented x3   Prognosis: Guarded in the setting of recurrent falls, subdural hematoma, ESRD on HD, diabetes, diabetic polyneuropathy, chronic respiratory failure with hypoxia, 5L O2 dependent, generalized weakness, hypothyroidism, morbidly obese, and wheelchair-bound.  Discharge Planning:  Home with Palliative Services  Recommendations:  Full code-as requested by patient  Continue with current treatment plan per medical team  Patient remains hopeful for improvement and stability.  Requesting outpatient palliative support.  Case management referral for outpatient palliative  PMT will continue to support and follow as needed   Palliative Performance Scale: PPS 30%              Patient expressed understanding and was in agreement with this plan.   Thank you for allowing the Palliative Medicine Team to assist in the care of this patient.  Time In: 1045 Time Out: 1155 Time Total: 70 min.   Visit consisted of counseling and education dealing with the complex and emotionally intense issues of symptom management and palliative care in the setting of serious and potentially life-threatening illness.Greater than 50%  of this time was spent counseling and coordinating care related to the above assessment and plan.  Signed by:  Alda Lea, AGPCNP-BC Palliative Medicine Team  Phone: [phone] Fax: [phone] Pager: [phone] Amion: Bjorn Pippin

## 2018-09-07 NOTE — Evaluation (Signed)
Physical Therapy Evaluation Patient Details Name: Joan Mosley MRN: [id number] DOB: 11-Jan-1946 Today's Date: 09/07/2018   History of Present Illness  Pt is a 73 y/o female admitted secondary to worsening weakness leading to falls. Pt found to have bilateral subdural hematomas; per notes to be managed conservatively at this time. PMH includes DM, COPD on home O2, and ESRD on HD.   Clinical Impression  Patient presents with decreased independence with mobility due to generalized weakness and imbalance.  Currently mod A of 2 for OOB to chair via stedy lift.  Was moving from recliner to w/c vs [facility] with daughter assist at home.  Currently appropriate for SNF level rehab, but likely to refuse so recommend HHPT/aide and possible hoyer lift for transfers.  PT to follow if not d/c.     Follow Up Recommendations SNF;Supervision/Assistance - 24 hour(versus home with 24 hour assist, HHPT/aide if refusing SNF)    Equipment Recommendations  Other (comment)(possibly needs hoyer lift)    Recommendations for Other Services       Precautions / Restrictions Precautions Precautions: Fall Restrictions Weight Bearing Restrictions: No      Mobility  Bed Mobility Overal bed mobility: Needs Assistance Bed Mobility: Supine to Sit     Supine to sit: Mod assist;+2 for physical assistance;+2 for safety/equipment     General bed mobility comments: Mod A for trunk elevation and assist to scoot hips towards EOB. Pt requiring extended time to perform tasks.   Transfers Overall transfer level: Needs assistance Equipment used: None Transfers: Sit to/from [facility] Sit to Stand: Min assist;Mod assist;+2 safety/equipment Stand pivot transfers: Min assist;Mod assist;+2 safety/equipment       General transfer comment: using stedy- transfer went well.  Ambulation/Gait             General Gait Details: nonambulatory at baseline  Stairs            Wheelchair Mobility     Modified Rankin (Stroke Patients Only)       Balance Overall balance assessment: Needs assistance Sitting-balance support: Feet supported Sitting balance-Leahy Scale: Fair   Postural control: Right lateral lean Standing balance support: Bilateral upper extremity supported Standing balance-Leahy Scale: Poor                               Pertinent Vitals/Pain Pain Assessment: Faces Faces Pain Scale: Hurts even more Pain Location: R thigh and hip Pain Descriptors / Indicators: Discomfort;Pressure Pain Intervention(s): Monitored during session;Repositioned    Home Living Family/patient expects to be discharged to:: Private residence Living Arrangements: Children Available Help at Discharge: Family;Available 24 hours/day Type of Home: House Home Access: Ramped entrance     Home Layout: One level Home Equipment: Walker - 2 wheels;Cane - single point;Wheelchair - Press photographer;Bedside commode Additional Comments: oxygen 5LPM    Prior Function Level of Independence: Needs assistance   Gait / Transfers Assistance Needed: Pt reports children have to assist her with transfers. Per RN, has been taking 2 people here recently to transfer.   ADL's / Homemaking Assistance Needed: Needs assist with bathing/dressing        Hand Dominance   Dominant Hand: Right    Extremity/Trunk Assessment   Upper Extremity Assessment Upper Extremity Assessment: Defer to OT evaluation RUE Deficits / Details: <90* shoulder flex, 3-/5 MM grade  RUE Coordination: decreased fine motor LUE Deficits / Details: <90* shoulder flex, 3-/5 MM grade  LUE Coordination: decreased fine  motor    Lower Extremity Assessment Lower Extremity Assessment: RLE deficits/detail;LLE deficits/detail RLE Deficits / Details: AAROM limited hip & kneeflexion due to soft tissue with pannus and due to pain; strength hip flexion <3/5, knee extension 3/5 LLE Deficits / Details: AAROM grossly WFL,  strength hip flexion <3/5, knee extension 3+/5    Cervical / Trunk Assessment Cervical / Trunk Assessment: Other exceptions Cervical / Trunk Exceptions: large body habitus leans to R  Communication   Communication: HOH  Cognition Arousal/Alertness: Awake/alert Behavior During Therapy: Flat affect Overall Cognitive Status: No family/caregiver present to determine baseline cognitive functioning                                 General Comments: Pt with slowed processing when responding to questions.       General Comments General comments (skin integrity, edema, etc.): on 6L O2 with SpO2 84% at lowest, back up to 90% in 2 minutes after transfer and HR max 121    Exercises     Assessment/Plan    PT Assessment Patient needs continued PT services  PT Problem List Decreased strength;Decreased balance;Decreased mobility;Decreased activity tolerance;Decreased knowledge of use of DME;Decreased knowledge of precautions       PT Treatment Interventions DME instruction;Functional mobility training;Therapeutic activities;Therapeutic exercise;Balance training;Patient/family education    PT Goals (Current goals can be found in the Care Plan section)  Acute Rehab PT Goals Patient Stated Goal: to go home PT Goal Formulation: With patient Time For Goal Achievement: 09/14/18 Potential to Achieve Goals: Fair    Frequency Min 2X/week   Barriers to discharge        Co-evaluation PT/OT/SLP Co-Evaluation/Treatment: Yes Reason for Co-Treatment: For patient/therapist safety PT goals addressed during session: Mobility/safety with mobility         AM-PAC PT "6 Clicks" Mobility  Outcome Measure Help needed turning from your back to your side while in a flat bed without using bedrails?: A Lot Help needed moving from lying on your back to sitting on the side of a flat bed without using bedrails?: A Lot Help needed moving to and from a bed to a chair (including a wheelchair)?:  Total Help needed standing up from a chair using your arms (e.g., wheelchair or bedside chair)?: Total Help needed to walk in hospital room?: Total Help needed climbing 3-5 steps with a railing? : Total 6 Click Score: 8    End of Session Equipment Utilized During Treatment: Gait belt;Oxygen Activity Tolerance: Patient tolerated treatment well Patient left: in chair;with call bell/phone within reach;with chair alarm set Nurse Communication: Mobility status PT Visit Diagnosis: Other abnormalities of gait and mobility (R26.89);Muscle weakness (generalized) (M62.81)    Time: [id number] PT Time Calculation (min) (ACUTE ONLY): 35 min   Charges:   PT Evaluation $PT Eval Moderate Complexity: [place], [place] Acute Rehabilitation Services [phone] 09/07/2018   Reginia Naas 09/07/2018, 10:48 AM

## 2018-09-07 NOTE — Progress Notes (Signed)
Neurosurgery Service Progress Note  Subjective: No acute events overnight, no new headaches or neurologic complaints, just lots of joint stiffness   Objective: Vitals:   09/07/18 0042 09/07/18 0314 09/07/18 0500 09/07/18 0743  BP: 115/62 107/70  121/64  Pulse: 87 88  98  Resp:    20  Temp:  98.6 F (37 C)  98.2 F (36.8 C)  TempSrc:  Oral  Oral  SpO2:  92%  93%  Weight:   112.5 kg   Height:       Temp (24hrs), Avg:98.2 F (36.8 C), Min:97.6 F (36.4 C), Max:98.6 F (37 C)  CBC Latest Ref Rng & Units 09/07/2018 09/06/2018 08/25/2018  WBC 4.0 - 10.5 K/uL 10.1 11.8(H) 9.1  Hemoglobin 12.0 - 15.0 g/dL 9.2(L) 9.4(L) 11.1(L)  Hematocrit 36.0 - 46.0 % 30.0(L) 30.9(L) 36.3  Platelets 150 - 400 K/uL 161 171 195   BMP Latest Ref Rng & Units 09/07/2018 09/06/2018 08/25/2018  Glucose 70 - 99 mg/dL 118(H) 237(H) 108(H)  BUN 8 - 23 mg/dL 32(H) 74(H) 30(H)  Creatinine 0.44 - 1.00 mg/dL 7.69(H) 13.46(H) 6.94(H)  BUN/Creat Ratio 6 - 22 (calc) - - -  Sodium 135 - 145 mmol/L 135 135 137  Potassium 3.5 - 5.1 mmol/L 4.8 5.4(H) 4.8  Chloride 98 - 111 mmol/L 95(L) 92(L) 96(L)  CO2 22 - 32 mmol/L 27 24 26   Calcium 8.9 - 10.3 mg/dL 9.0 8.7(L) 9.5    Intake/Output Summary (Last 24 hours) at 09/07/2018 [id number] Last data filed at 09/06/2018 1755 Gross per 24 hour  Intake -  Output 2500 ml  Net -2500 ml    Current Facility-Administered Medications:  .  acetaminophen (TYLENOL) tablet 650 mg, 650 mg, Oral, Q6H PRN **OR** acetaminophen (TYLENOL) suppository 650 mg, 650 mg, Rectal, Q6H PRN, Karmen Bongo, MD .  albuterol (PROVENTIL) (2.5 MG/3ML) 0.083% nebulizer solution 3 mL, 3 mL, Inhalation, Q6H PRN, Karmen Bongo, MD .  calcitRIOL (ROCALTROL) capsule 1.5 mcg, 1.5 mcg, Oral, Q T,Th,Sa-HD, Collins, Samantha G, PA-C, 1.5 mcg at 09/06/18 1416 .  calcium carbonate (dosed in mg elemental calcium) suspension 500 mg of elemental calcium, 500 mg of elemental calcium, Oral, Q6H PRN, Karmen Bongo, MD .   camphor-menthol [facility]) lotion 1 application, 1 application, Topical, [id number] PRN **AND** hydrOXYzine (ATARAX/VISTARIL) tablet 25 mg, 25 mg, Oral, Q8H PRN, Karmen Bongo, MD .  Chlorhexidine Gluconate Cloth 2 % PADS 6 each, 6 each, Topical, Q0600, Janalee Dane, PA-C, 6 each at 09/07/18 [phone]  cinacalcet (SENSIPAR) tablet 60 mg, 60 mg, Oral, Q T,Th,Sa-HD, Collins, Samantha G, PA-C .  clonazePAM (KLONOPIN) tablet 1 mg, 1 mg, Oral, BID PRN, Karmen Bongo, MD .  docusate sodium [facility]) enema 283 mg, 1 enema, Rectal, PRN, Karmen Bongo, MD .  feeding supplement (NEPRO CARB STEADY) liquid 237 mL, 237 mL, Oral, TID PRN, Karmen Bongo, MD .  FLUoxetine (PROZAC) capsule 10 mg, 10 mg, Oral, Daily, Karmen Bongo, MD .  furosemide (LASIX) tablet 80 mg, 80 mg, Oral, Once per day on Sun Mon Wed Fri, Yates, Anderson Malta, MD .  insulin aspart (novoLOG) injection 0-5 Units, 0-5 Units, Subcutaneous, QHS, Karmen Bongo, MD .  insulin aspart (novoLOG) injection 0-9 Units, 0-9 Units, Subcutaneous, TID WC, Karmen Bongo, MD, 1 Units at 09/07/18 0700 .  insulin detemir (LEVEMIR) injection 15 Units, 15 Units, Subcutaneous, Daily, Karmen Bongo, MD .  ipratropium (ATROVENT) nebulizer solution 0.5 mg, 0.5 mg, Inhalation, QID, Karmen Bongo, MD .  levothyroxine (SYNTHROID) tablet 112 mcg, 112 mcg,  Oral, QAC breakfast, Karmen Bongo, MD, 112 mcg at 09/07/18 0631 .  lidocaine (LIDODERM) 5 % 1 patch, 1 patch, Transdermal, Q12H PRN, Karmen Bongo, MD .  Derrill Memo ON 09/08/2018] midodrine (PROAMATINE) tablet 10 mg, 10 mg, Oral, 3 times per day on Tue Thu Sat, Yates, Anderson Malta, MD .  montelukast (SINGULAIR) tablet 10 mg, 10 mg, Oral, Ivery Quale, MD, 10 mg at 09/06/18 2232 .  ondansetron (ZOFRAN) tablet 4 mg, 4 mg, Oral, Q6H PRN **OR** ondansetron (ZOFRAN) injection 4 mg, 4 mg, Intravenous, Q6H PRN, Karmen Bongo, MD, 4 mg at 09/06/18 1614 .  rosuvastatin (CRESTOR) tablet 40 mg, 40 mg, Oral, q1800,  Karmen Bongo, MD, 40 mg at 09/06/18 1840 .  senna-docusate (Senokot-S) tablet 1 tablet, 1 tablet, Oral, QHS, Karmen Bongo, MD, 1 tablet at 09/06/18 2231 .  sevelamer carbonate (RENVELA) tablet 2,400 mg, 2,400 mg, Oral, TID WC, Collins, Samantha G, PA-C, 2,400 mg at 09/07/18 0756 .  sorbitol 70 % solution 30 mL, 30 mL, Oral, PRN, Karmen Bongo, MD .  zolpidem (AMBIEN) tablet 5 mg, 5 mg, Oral, QHS PRN, Karmen Bongo, MD   Physical Exam: AOx3, PERRL, EOMI, FS, Strength 5/5 x4, SILTx4, no drift  Assessment & Plan: 73 y.o. woman s/p fall with SDH.  -okay for discharge from a neurosurgical perspective. Pt should follow up with Dr. Arnoldo Morale in 2 weeks with a repeat CT head, can follow up with me if he does not have time in his clinic schedule. She can call [phone] to make her follow up appointment. Given her falls and ESRD, I think that the benefits are not worth the risks of restarting her clopidogrel.  Judith Part  09/07/18 8:52 AM

## 2018-09-07 NOTE — Discharge Summary (Signed)
Physician Discharge Summary  Joan Mosley [id number] DOB: 1945-02-28 DOA: 09/06/2018  PCP: Flossie Buffy, NP  Admit date: 09/06/2018 Discharge date: 09/07/2018  Admitted From: Home Disposition: Home with home health  Recommendations for Outpatient Follow-up:  1. Follow up with PCP in 1-2 weeks 2. Please obtain BMP/CBC in one week your next doctors visit.  3. Recommend outpatient palliative care follow-up   Discharge Condition: Stable CODE STATUS: Full code Diet recommendation: Diabetic  Brief/Interim Summary: 73 year old with history of TIA, COPD on 5 L nasal cannula at home, ESRD on dialysis TTS, morbid obesity, diabetes mellitus type 2 came to the hospital with weakness.  She had a fall outpatient sustaining head injury.  CT of the head showed subdural hematomas.  Neurosurgery was consulted recommended overnight observation.  Patient did well in the hospital overnight without any further complaints.  She was seen by physical therapy who recommended skilled nursing facility but patient was adamant about going home with home health.  Patient tells me she is mostly wheelchair dependent and requires quite a bit of assistance even with transfer. Patient is deemed a poor candidate for any anticoagulation or anti-platelet therapies in the future. She was also dialyzed once in the hospital.  She tolerated this well. Would recommend patient follow-up with palliative care team.   Discharge Diagnoses:  Principal Problem:   SDH (subdural hematoma) (HCC) Active Problems:   Counseling regarding advanced directives and goals of care   Hemodialysis-associated hypotension   Type 2 diabetes mellitus with diabetic polyneuropathy, with long-term current use of insulin (HCC)   Gait instability   ESRD (end stage renal disease) (HCC)   Chronic respiratory failure with hypoxia, on home O2 therapy (HCC)  Mechanical fall leading to head injury with subdural hematoma -Neurologically intact.   Seen by neurosurgery.  No further interventions at this time.  Stable for discharge.  Keep her off any anticoagulation or antiplatelet therapy even moving forward given at high risk of recurrence. -Physical therapy recommends skilled nursing facility, patient refuses to go to skilled nursing facility and would rather go home with home health therefore arrangements made.  Chronic hypoxia with COPD on 5 L nasal cannula at home -Continue home bronchodilators.  Breathing status is currently at baseline.  ESRD on hemodialysis Tuesday Thursday Saturday -Nephro consulted, status post 1 session of dialysis in the hospital.  Tolerated well.  Resume home medications.  Hypotension associated with hemodialysis - Midrin prior to her dialysis sessions.  Hypothyroidism -Synthroid  Diabetes mellitus type 2, insulin-dependent -Resume home dose of Levemir.  Patient currently wishes to be full code.  Needs to follow-up outpatient with palliative care team.  Consultations:  Neurosurgery  Subjective: Feels okay, wishes to go home.  No focal neuro deficits.   Discharge Exam: Vitals:   09/07/18 0314 09/07/18 0743  BP: 107/70 121/64  Pulse: 88 98  Resp:  20  Temp: 98.6 F (37 C) 98.2 F (36.8 C)  SpO2: 92% 93%   Vitals:   09/07/18 0042 09/07/18 0314 09/07/18 0500 09/07/18 0743  BP: 115/62 107/70  121/64  Pulse: 87 88  98  Resp:    20  Temp:  98.6 F (37 C)  98.2 F (36.8 C)  TempSrc:  Oral  Oral  SpO2:  92%  93%  Weight:   112.5 kg   Height:        General: Pt is alert, awake, not in acute distress Cardiovascular: RRR, S1/S2 +, no rubs, no gallops Respiratory: Bibasilar crackles, chronically on 5  L nasal cannula Abdominal: Soft, NT, ND, bowel sounds + Extremities: no edema, no cyanosis  Discharge Instructions  Discharge Instructions    Diet - low sodium heart healthy   Complete by: As directed    Increase activity slowly   Complete by: As directed      Allergies as of  09/07/2018      Reactions   Avelox [moxifloxacin Hcl In Nacl]    PT does not remember   Codeine Anaphylaxis   PT does not remember   Other    PT does not remember   Penicillins    Rash   Sulfa Antibiotics    PT does nolt remember   Shellfish Allergy Hives, Itching, Rash      Medication List    TAKE these medications   acetaminophen 500 MG tablet Commonly known as: TYLENOL Take 1,000 mg by mouth every 6 (six) hours as needed for mild pain.   albuterol 108 (90 Base) MCG/ACT inhaler Commonly known as: VENTOLIN HFA Inhale 2 puffs into the lungs every 6 (six) hours as needed for wheezing or shortness of breath.   calcitRIOL 0.25 MCG capsule Commonly known as: ROCALTROL Take 0.25 mcg by mouth daily.   clonazePAM 1 MG tablet Commonly known as: KLONOPIN Take 1 tablet (1 mg total) by mouth 2 (two) times daily as needed for anxiety. Change in dose   FLUoxetine 10 MG tablet Commonly known as: PROZAC Take 1 tablet (10 mg total) by mouth daily.   furosemide 80 MG tablet Commonly known as: LASIX Take 80 mg by mouth See admin instructions. Sunday, Monday,Wed,Friday--in AM   insulin detemir 100 UNIT/ML injection Commonly known as: LEVEMIR Inject 0.15 mLs (15 Units total) into the skin daily.   insulin lispro 100 UNIT/ML injection Commonly known as: HUMALOG Inject 1-5 Units into the skin 3 (three) times daily with meals as needed for high blood sugar. Sliding scale   ipratropium 0.02 % nebulizer solution Commonly known as: ATROVENT Inhale 0.5 mg into the lungs 4 (four) times daily.   levothyroxine 112 MCG tablet Commonly known as: SYNTHROID Take 1 tablet (112 mcg total) by mouth daily before breakfast. 1 AM   lidocaine 5 % Commonly known as: LIDODERM Place 1 patch onto the skin every 12 (twelve) hours as needed (back pain). Remove & Discard patch within 12 hours or as directed by MD   midodrine 10 MG tablet Commonly known as: PROAMATINE Take 10 mg by mouth 3 (three)  times daily. Taking on Dialysis days, Tues, Thur, Sat.   montelukast 10 MG tablet Commonly known as: SINGULAIR Take 10 mg by mouth at bedtime. PM   nystatin powder Commonly known as: MYCOSTATIN/NYSTOP Apply 1 g topically 2 (two) times daily as needed (skin rash).   nystatin cream Commonly known as: MYCOSTATIN Apply 1 application topically 2 (two) times daily as needed for dry skin.   rosuvastatin 40 MG tablet Commonly known as: CRESTOR Take 40 mg by mouth daily. AM   senna-docusate 8.6-50 MG tablet Commonly known as: Senna Plus Take 1 tablet by mouth at bedtime.   sevelamer 800 MG tablet Commonly known as: RENAGEL Take 1,600 mg by mouth 3 (three) times daily with meals. 1600 mg --3 times daily--take additional 1-2 if eats snack            Durable Medical Equipment  (From admission, onward)         Start     Ordered   09/07/18 1035  For home use only  DME Other see comment  Once    Comments: Hoyer lift  Question:  Length of Need  Answer:  Lifetime   09/07/18 1035          Allergies  Allergen Reactions  . Avelox [Moxifloxacin Hcl In Nacl]     PT does not remember  . Codeine Anaphylaxis    PT does not remember  . Other     PT does not remember  . Penicillins     Rash  . Sulfa Antibiotics     PT does nolt remember  . Shellfish Allergy Hives, Itching and Rash    You were cared for by a hospitalist during your hospital stay. If you have any questions about your discharge medications or the care you received while you were in the hospital after you are discharged, you can call the unit and asked to speak with the hospitalist on call if the hospitalist that took care of you is not available. Once you are discharged, your primary care physician will handle any further medical issues. Please note that no refills for any discharge medications will be authorized once you are discharged, as it is imperative that you return to your primary care physician (or establish a  relationship with a primary care physician if you do not have one) for your aftercare needs so that they can reassess your need for medications and monitor your lab values.   Procedures/Studies: Dg Chest 2 View  Result Date: 08/22/2018 CLINICAL DATA:  Weakness, altered level of consciousness EXAM: CHEST - 2 VIEW COMPARISON:  08/02/2018 FINDINGS: Lungs are clear.  No pleural effusion or pneumothorax. Cardiomegaly. Right IJ dual lumen dialysis catheter terminates in the upper right atrium. Mild degenerative changes of the visualized thoracolumbar spine. IMPRESSION: Normal chest radiographs. Electronically Signed   By: Julian Hy M.D.   On: 08/22/2018 19:52   Ct Head Wo Contrast  Result Date: 09/06/2018 CLINICAL DATA:  Altered mental status and fatigue. Recent subdural hematomas EXAM: CT HEAD WITHOUT CONTRAST TECHNIQUE: Contiguous axial images were obtained from the base of the skull through the vertex without intravenous contrast. COMPARISON:  August 25, 2018 FINDINGS: Brain: There is age related volume loss. There remains a subdural hematoma in the right frontal region with both acute and more chronic appearing fluid. There remains a degree of mass effect on the right frontal lobe, slightly less than on the previous study. The current maximum thickness of the subdural hematoma on the right measures 1.4 cm compared to a maximum thickness of 1.6 cm. There is a subdural hematoma on the left in the frontal and parietal regions which also has mixed attenuation consistent with both relatively acute and more subacute to chronic hemorrhage. The maximum thickness of this subdural hematoma measures 1.3 cm compared to 1.6 cm on most recent study. No new hemorrhage or new extra-axial fluid collections are evident. There is no intra-axial mass or hemorrhage. Currently there is 2 mm of midline shift to the right compared to 5 mm of midline shift to the right on most recent study. There is small vessel disease in the  centra semiovale bilaterally. No acute infarct is appreciable. Vascular: No hyperdense vessels are evident. There is calcification in each carotid siphon region and distal left vertebral artery. Skull: The bony calvarium appears intact. Sinuses/Orbits: There is opacification throughout the right maxillary antrum. There are more scattered areas of opacification in the left maxillary antrum with probable polyp superiorly. There is mucosal thickening in several ethmoid air cells  as well as in the right frontal sinus region. Orbits appear symmetric bilaterally. Other: There is opacification of several inferior mastoid air cells bilaterally, stable. IMPRESSION: 1. There remain bilateral subdural hematomas with both more acute and more chronic appearing fluid on each side. The right subdural hematoma is seen in the right frontal region with mild mass effect on the right frontal lobe. This subdural hematoma at its maximum measures 1.4 cm compared to a thickness of 1.6 cm on the previous study. On the left, the subdural hematoma is slightly larger and involves frontal and parietal regions. The mixed attenuation in the fluid remains stable. The maximum thickness on the left currently measures 1.3 cm compared to 1.6 cm on the previous study. There appears to be slightly less mass effect from the subdural hematomas compared to recent prior study. There is 2 mm of midline shift to the right compared to 5 mm of midline shift to the right on the previous study. 2. No intra-axial hemorrhage. There is patchy periventricular small vessel disease. No acute infarct evident. 3.  Foci of arterial vascular calcification noted. 4. Multifocal paranasal sinus disease again noted. Mild inferior mastoid disease bilaterally noted. Electronically Signed   By: Lowella Grip III M.D.   On: 09/06/2018 08:43   Ct Head Wo Contrast  Result Date: 08/25/2018 CLINICAL DATA:  Follow-up examination for subdural hemorrhage. EXAM: CT HEAD WITHOUT  CONTRAST TECHNIQUE: Contiguous axial images were obtained from the base of the skull through the vertex without intravenous contrast. COMPARISON:  Prior CT from 08/22/2018 FINDINGS: Brain: Bilateral extra-axial mixed density subdural hematomas again seen, acute-subacute on chronic in appearance. Left-sided collection measures up to 16 mm in maximal thickness at the left frontal convexity. Right collection measures up to 16 mm as well. Overall, appearance is not significantly changed from previous. Secondary mild mass effect on the subjacent cerebral hemispheres with persistent 5 mm left-to-right shift, little interval changed. No hydrocephalus or ventricular trapping. Basilar cisterns remain patent. No new intracranial hemorrhage. No acute large vessel territory infarct. Atrophy with chronic microvascular ischemic disease noted. Vascular: Calcified atherosclerosis noted at the skull base. No hyperdense vessel. Skull: Scalp soft tissues and calvarium within normal limits. Sinuses/Orbits: Globes and orbital soft tissues are normal. Chronic right-sided paranasal sinusitis noted. Trace bilateral mastoid effusions noted. Other: None. IMPRESSION: 1. No significant interval change in size and appearance of bilateral mixed density subdural hematomas, measuring up to 16 mm in maximal diameter on current exam. Associated 5 mm left-to-right midline shift also not significantly changed. 2. No other new acute intracranial abnormality. 3. Atrophy with chronic microvascular ischemic disease. 4. Chronic right-sided paranasal sinusitis. Electronically Signed   By: Jeannine Boga M.D.   On: 08/25/2018 04:06   Ct Head Wo Contrast  Result Date: 08/22/2018 CLINICAL DATA:  Altered level of consciousness. EXAM: CT HEAD WITHOUT CONTRAST TECHNIQUE: Contiguous axial images were obtained from the base of the skull through the vertex without intravenous contrast. COMPARISON:  None. FINDINGS: Brain: Bilateral extra-axial fluid  collections compatible with subdural hematomas that appears subacute to chronic. Majority of the fluid is low-density. There are small areas of higher density. Multiple septations are seen within the fluid. Right-sided collection measures approximately 14 mm and left sided collection measures 15 mm. Minimal midline shift to the right. Negative for hydrocephalus. No acute infarct. Chronic ischemic change in the white matter. Vascular: Negative for hyperdense vessel Skull: Negative Sinuses/Orbits: Mucosal edema paranasal sinuses most notably right frontal ethmoid and maxillary sinuses. Bony thickening of  the maxillary sinus bilaterally Other: None IMPRESSION: 15 mm bilateral subdural hematomas which appears subacute to chronic. Majority of the fluid is low-density without evidence of acute bleeding. Minimal midline shift to the right. These results were called by telephone at the time of interpretation on 08/22/2018 at 9:18 pm to Dr. Isla Pence , who verbally acknowledged these results. Electronically Signed   By: Franchot Gallo M.D.   On: 08/22/2018 21:19      The results of significant diagnostics from this hospitalization (including imaging, microbiology, ancillary and laboratory) are listed below for reference.     Microbiology: Recent Results (from the past 240 hour(s))  SARS CORONAVIRUS 2 Nasal Swab Aptima Multi Swab     Status: None   Collection Time: 09/06/18  9:16 AM   Specimen: Aptima Multi Swab; Nasal Swab  Result Value Ref Range Status   SARS Coronavirus 2 NEGATIVE NEGATIVE Final    Comment: (NOTE) SARS-CoV-2 target nucleic acids are NOT DETECTED. The SARS-CoV-2 RNA is generally detectable in upper and lower respiratory specimens during the acute phase of infection. Negative results do not preclude SARS-CoV-2 infection, do not rule out co-infections with other pathogens, and should not be used as the sole basis for treatment or other patient management decisions. Negative results  must be combined with clinical observations, patient history, and epidemiological information. The expected result is Negative. Fact Sheet for Patients: SugarRoll.be Fact Sheet for Healthcare Providers: https://www.woods-mathews.com/ This test is not yet approved or cleared by the [place] FDA and  has been authorized for detection and/or diagnosis of SARS-CoV-2 by FDA under an Emergency Use Authorization (EUA). This EUA will remain  in effect (meaning this test can be used) for the duration of the COVID-19 declaration under Section 56 4(b)(1) of the Act, 21 U.S.C. section 360bbb-3(b)(1), unless the authorization is terminated or revoked sooner. Performed at [facility], [place] [address] [place], [place] [address]      Labs: BNP (last 3 results) No results for input(s): BNP in the last 8760 hours. Basic Metabolic Panel: Recent Labs  Lab 09/06/18 0744 09/07/18 0353  NA 135 135  K 5.4* 4.8  CL 92* 95*  CO2 24 27  GLUCOSE 237* 118*  BUN 74* 32*  CREATININE 13.46* 7.69*  CALCIUM 8.7* 9.0   Liver Function Tests: No results for input(s): AST, ALT, ALKPHOS, BILITOT, PROT, ALBUMIN in the last 168 hours. No results for input(s): LIPASE, AMYLASE in the last 168 hours. No results for input(s): AMMONIA in the last 168 hours. CBC: Recent Labs  Lab 09/06/18 0744 09/07/18 0353  WBC 11.8* 10.1  NEUTROABS 9.4*  --   HGB 9.4* 9.2*  HCT 30.9* 30.0*  MCV 96.6 97.1  PLT 171 161   Cardiac Enzymes: No results for input(s): CKTOTAL, CKMB, CKMBINDEX, TROPONINI in the last 168 hours. BNP: Invalid input(s): POCBNP CBG: Recent Labs  Lab 09/06/18 0754 09/06/18 1152 09/06/18 1832 09/06/18 2118 09/07/18 0605  GLUCAP 176* 128* 83 142* 122*   D-Dimer No results for input(s): DDIMER in the last 72 hours. Hgb A1c No results for input(s): HGBA1C in the last 72 hours. Lipid Profile No results for input(s): CHOL, HDL, LDLCALC, TRIG,  CHOLHDL, LDLDIRECT in the last 72 hours. Thyroid function studies No results for input(s): TSH, T4TOTAL, T3FREE, THYROIDAB in the last 72 hours.  Invalid input(s): FREET3 Anemia work up No results for input(s): VITAMINB12, FOLATE, FERRITIN, TIBC, IRON, RETICCTPCT in the last 72 hours. Urinalysis    Component Value Date/Time  COLORURINE YELLOW 04/20/2018 0301   APPEARANCEUR HAZY (A) 04/20/2018 0301   LABSPEC 1.009 04/20/2018 0301   PHURINE 9.0 (H) 04/20/2018 0301   GLUCOSEU 150 (A) 04/20/2018 0301   HGBUR MODERATE (A) 04/20/2018 0301   BILIRUBINUR NEGATIVE 04/20/2018 0301   KETONESUR NEGATIVE 04/20/2018 0301   PROTEINUR 100 (A) 04/20/2018 0301   NITRITE NEGATIVE 04/20/2018 0301   LEUKOCYTESUR NEGATIVE 04/20/2018 0301   Sepsis Labs Invalid input(s): PROCALCITONIN,  WBC,  LACTICIDVEN Microbiology Recent Results (from the past 240 hour(s))  SARS CORONAVIRUS 2 Nasal Swab Aptima Multi Swab     Status: None   Collection Time: 09/06/18  9:16 AM   Specimen: Aptima Multi Swab; Nasal Swab  Result Value Ref Range Status   SARS Coronavirus 2 NEGATIVE NEGATIVE Final    Comment: (NOTE) SARS-CoV-2 target nucleic acids are NOT DETECTED. The SARS-CoV-2 RNA is generally detectable in upper and lower respiratory specimens during the acute phase of infection. Negative results do not preclude SARS-CoV-2 infection, do not rule out co-infections with other pathogens, and should not be used as the sole basis for treatment or other patient management decisions. Negative results must be combined with clinical observations, patient history, and epidemiological information. The expected result is Negative. Fact Sheet for Patients: SugarRoll.be Fact Sheet for Healthcare Providers: https://www.woods-mathews.com/ This test is not yet approved or cleared by the [place] FDA and  has been authorized for detection and/or diagnosis of SARS-CoV-2 by FDA under  an Emergency Use Authorization (EUA). This EUA will remain  in effect (meaning this test can be used) for the duration of the COVID-19 declaration under Section 56 4(b)(1) of the Act, 21 U.S.C. section 360bbb-3(b)(1), unless the authorization is terminated or revoked sooner. Performed at [facility], [place] [address], [place], [place] [address]      Time coordinating discharge:  I have spent 35 minutes face to face with the patient and on the ward discussing the patients care, assessment, plan and disposition with other care givers. >50% of the time was devoted counseling the patient about the risks and benefits of treatment/Discharge disposition and coordinating care.   SIGNED:   Damita Lack, MD  [facility] 09/07/2018, 12:12 PM   If 7PM-7AM, please contact night-coverage [email or web address]

## 2018-09-07 NOTE — Telephone Encounter (Signed)
Ok, thank you

## 2018-09-07 NOTE — Plan of Care (Signed)
  Problem: Education: Goal: Knowledge of General Education information will improve Description: Including pain rating scale, medication(s)/side effects and non-pharmacologic comfort measures 09/07/2018 1252 by Myriam Forehand, RN Outcome: Adequate for Discharge 09/07/2018 1252 by Myriam Forehand, RN Outcome: Adequate for Discharge   Problem: Health Behavior/Discharge Planning: Goal: Ability to manage health-related needs will improve 09/07/2018 1252 by Myriam Forehand, RN Outcome: Adequate for Discharge 09/07/2018 1252 by Myriam Forehand, RN Outcome: Adequate for Discharge   Problem: Clinical Measurements: Goal: Ability to maintain clinical measurements within normal limits will improve 09/07/2018 1252 by Myriam Forehand, RN Outcome: Adequate for Discharge 09/07/2018 1252 by Myriam Forehand, RN Outcome: Adequate for Discharge Goal: Will remain free from infection 09/07/2018 1252 by Myriam Forehand, RN Outcome: Adequate for Discharge 09/07/2018 1252 by Myriam Forehand, RN Outcome: Adequate for Discharge Goal: Diagnostic test results will improve 09/07/2018 1252 by Myriam Forehand, RN Outcome: Adequate for Discharge 09/07/2018 1252 by Myriam Forehand, RN Outcome: Adequate for Discharge Goal: Respiratory complications will improve 09/07/2018 1252 by Myriam Forehand, RN Outcome: Adequate for Discharge 09/07/2018 1252 by Myriam Forehand, RN Outcome: Adequate for Discharge Goal: Cardiovascular complication will be avoided 09/07/2018 1252 by Myriam Forehand, RN Outcome: Adequate for Discharge 09/07/2018 1252 by Myriam Forehand, RN Outcome: Adequate for Discharge   Problem: Activity: Goal: Risk for activity intolerance will decrease 09/07/2018 1252 by Myriam Forehand, RN Outcome: Adequate for Discharge 09/07/2018 1252 by Myriam Forehand, RN Outcome: Adequate for Discharge   Problem: Nutrition: Goal: Adequate nutrition will be maintained 09/07/2018 1252 by Myriam Forehand, RN Outcome: Adequate for Discharge 09/07/2018 1252  by Myriam Forehand, RN Outcome: Adequate for Discharge   Problem: Coping: Goal: Level of anxiety will decrease 09/07/2018 1252 by Myriam Forehand, RN Outcome: Adequate for Discharge 09/07/2018 1252 by Myriam Forehand, RN Outcome: Adequate for Discharge   Problem: Elimination: Goal: Will not experience complications related to bowel motility 09/07/2018 1252 by Myriam Forehand, RN Outcome: Adequate for Discharge 09/07/2018 1252 by Myriam Forehand, RN Outcome: Adequate for Discharge Goal: Will not experience complications related to urinary retention 09/07/2018 1252 by Myriam Forehand, RN Outcome: Adequate for Discharge 09/07/2018 1252 by Myriam Forehand, RN Outcome: Adequate for Discharge   Problem: Pain Managment: Goal: General experience of comfort will improve 09/07/2018 1252 by Myriam Forehand, RN Outcome: Adequate for Discharge 09/07/2018 1252 by Myriam Forehand, RN Outcome: Adequate for Discharge   Problem: Safety: Goal: Ability to remain free from injury will improve 09/07/2018 1252 by Myriam Forehand, RN Outcome: Adequate for Discharge 09/07/2018 1252 by Myriam Forehand, RN Outcome: Adequate for Discharge   Problem: Skin Integrity: Goal: Risk for impaired skin integrity will decrease 09/07/2018 1252 by Myriam Forehand, RN Outcome: Adequate for Discharge 09/07/2018 1252 by Myriam Forehand, RN Outcome: Adequate for Discharge

## 2018-09-07 NOTE — Evaluation (Signed)
Occupational Therapy Evaluation Patient Details Name: Joan Mosley MRN: [id number] DOB: 22-Sep-1945 Today's Date: 09/07/2018    History of Present Illness Pt is a 73 y/o female admitted secondary to worsening weakness leading to falls. Pt found to have bilateral subdural hematomas; per notes to be managed conservatively at this time. PMH includes DM, COPD on home O2, and ESRD on HD.    Clinical Impression   Pt PTA: pt living with daughter who is assisted 24/7 with ADL and mobility. Pt reports sleeping in a recliner and assist for transfer to w/c with assist +2. Pt with falls recently and generalized weakness. Pt currently performing bed mobility with modA +2 for safety and scooting to edge of bed; minA to modA +2 for sit to stand using stedy. Pt's O2 sats 86-90% on 6L O2 throughout session. Pt with poor activity tolerance experiencing dyspnea and increased time for tasks. Pt requiring maxA to [place] for ADL, set-upA for grooming. Pt would greatly benefit from continued OT skilled services for ADL, mobility and safety in SNF setting. OT following acutely.  Pt may refuse SNFso pt would benefit from [facility] with hoyer lift for safest scenario for transfers.    Follow Up Recommendations  SNF;Supervision/Assistance - 24 hour(HHOT with hoyer lift if pt refuses SNF)    Equipment Recommendations  Other (comment)(hoyer lift with pad)    Recommendations for Other Services       Precautions / Restrictions Precautions Precautions: Fall Restrictions Weight Bearing Restrictions: No      Mobility Bed Mobility Overal bed mobility: Needs Assistance Bed Mobility: Supine to Sit     Supine to sit: Mod assist;+2 for physical assistance;+2 for safety/equipment     General bed mobility comments: Mod A for trunk elevation and assist to scoot hips towards EOB. Pt requiring extended time to perform tasks.   Transfers Overall transfer level: Needs assistance Equipment used: None Transfers: Sit to/from  [facility] Sit to Stand: Min assist;Mod assist;+2 physical assistance;+2 safety/equipment Stand pivot transfers: Min assist;Mod assist;+2 physical assistance;+2 safety/equipment       General transfer comment: using stedy- transfer went well.    Balance Overall balance assessment: Needs assistance Sitting-balance support: Feet supported Sitting balance-Leahy Scale: Fair   Postural control: Right lateral lean Standing balance support: Bilateral upper extremity supported Standing balance-Leahy Scale: Poor                             ADL either performed or assessed with clinical judgement   ADL Overall ADL's : Needs assistance/impaired Eating/Feeding: Set up;Bed level Eating/Feeding Details (indicate cue type and reason): requires containers opened for her Grooming: Wash/dry hands;Wash/dry face;Set up;Bed level Grooming Details (indicate cue type and reason): provided materials -set-upA. Pt refusing mouth rinse. Upper Body Bathing: Moderate assistance;Bed level;Sitting   Lower Body Bathing: Maximal assistance;Total assistance;Bed level;Sitting/lateral leans   Upper Body Dressing : Moderate assistance;Sitting;Bed level   Lower Body Dressing: Maximal assistance;Total assistance;Sitting/lateral leans;Bed level   Toilet Transfer: Minimal assistance;Moderate assistance;+2 for physical assistance;+2 for safety/equipment;BSC Toilet Transfer Details (indicate cue type and reason): requires stedy +2 Toileting- Clothing Manipulation and Hygiene: Moderate assistance;+2 for physical assistance;+2 for safety/equipment;Sit to/from stand       Functional mobility during ADLs: Minimal assistance;Moderate assistance;+2 for physical assistance;+2 for safety/equipment(stedy for transfer only) General ADL Comments: Pt appears weak and unable to care for self; at home, pt reports her daughter helps her with everything with the exception of self feed and  grooming.      Vision Baseline Vision/History: No visual deficits Vision Assessment?: No apparent visual deficits     Perception     Praxis      Pertinent Vitals/Pain Pain Assessment: Faces Faces Pain Scale: Hurts even more Pain Location: R thigh and hip Pain Descriptors / Indicators: Discomfort;Pressure Pain Intervention(s): Limited activity within patient's tolerance;Monitored during session     Hand Dominance Right   Extremity/Trunk Assessment Upper Extremity Assessment Upper Extremity Assessment: Generalized weakness;LUE deficits/detail;RUE deficits/detail RUE Deficits / Details: <90* shoulder flex, 3-/5 MM grade  RUE Coordination: decreased fine motor LUE Deficits / Details: <90* shoulder flex, 3-/5 MM grade  LUE Coordination: decreased fine motor   Lower Extremity Assessment Lower Extremity Assessment: Defer to PT evaluation;Generalized weakness   Cervical / Trunk Assessment Cervical / Trunk Assessment: Other exceptions Cervical / Trunk Exceptions: large body habitus leans to R   Communication Communication Communication: HOH   Cognition Arousal/Alertness: Awake/alert Behavior During Therapy: Flat affect Overall Cognitive Status: Within Functional Limits for tasks assessed                                     General Comments  86-90% on 6L O2 throughout session. Pt with poor activity tolerance experiencing dyspnea and increased time for tasks.    Exercises     Shoulder Instructions      Home Living Family/patient expects to be discharged to:: Private residence Living Arrangements: Children Available Help at Discharge: Family;Available 24 hours/day Type of Home: House Home Access: Ramped entrance     Home Layout: One level               Home Equipment: Walker - 2 wheels;Cane - single point;Wheelchair - Press photographer;Bedside commode          Prior Functioning/Environment Level of Independence: Needs assistance  Gait /  Transfers Assistance Needed: Pt reports children have to assist her with transfers. Per RN, has been taking 2 people here recently to transfer.  ADL's / Homemaking Assistance Needed: Needs assist with bathing/dressing            OT Problem List: Decreased strength;Decreased activity tolerance;Impaired balance (sitting and/or standing);Decreased safety awareness;Obesity;Pain;Increased edema;Decreased range of motion      OT Treatment/Interventions: Self-care/ADL training;Therapeutic exercise;Neuromuscular education;Energy conservation;DME and/or AE instruction;Therapeutic activities;Patient/family education;Balance training    OT Goals(Current goals can be found in the care plan section) Acute Rehab OT Goals Patient Stated Goal: to go home OT Goal Formulation: With patient Time For Goal Achievement: 09/21/18 Potential to Achieve Goals: Good ADL Goals Pt Will Perform Upper Body Dressing: with modified independence;sitting Pt Will Perform Lower Body Dressing: with min assist;sitting/lateral leans;sit to/from stand Pt Will Transfer to Toilet: with min assist;stand pivot transfer Pt Will Perform Toileting - Clothing Manipulation and hygiene: with supervision;sit to/from stand Pt/caregiver will Perform Home Exercise Program: Increased strength;Both right and left upper extremity Additional ADL Goal #1: Pt will perform sitting upright for ADL x15 mins with 1 rest break.  OT Frequency: Min 2X/week   Barriers to D/C:            Co-evaluation              AM-PAC OT "6 Clicks" Daily Activity     Outcome Measure Help from another person eating meals?: None Help from another person taking care of personal grooming?: A Little Help from another person toileting, which includes using toliet,  bedpan, or urinal?: Total Help from another person bathing (including washing, rinsing, drying)?: A Lot Help from another person to put on and taking off regular upper body clothing?: A Lot Help  from another person to put on and taking off regular lower body clothing?: Total 6 Click Score: 13   End of Session Equipment Utilized During Treatment: Gait belt Nurse Communication: Mobility status  Activity Tolerance: Patient tolerated treatment well Patient left: in chair;with call bell/phone within reach;with chair alarm set  OT Visit Diagnosis: Unsteadiness on feet (R26.81);Muscle weakness (generalized) (M62.81)                Time: [id number] OT Time Calculation (min): 40 min Charges:  OT General Charges $OT Visit: 1 Visit OT Evaluation $OT Eval Moderate Complexity: 1 Mod OT Treatments $Self Care/Home Management : 8-22 mins  [name] [name] OTR/L Acute Rehabilitation Services Pager: [phone] Office: [place] 09/07/2018, 9:51 AM

## 2018-09-07 NOTE — TOC Initial Note (Addendum)
Transition of Care [facility]) - Initial/Assessment Note    Patient Details  Name: Joan Mosley MRN: [id number] Date of Birth: [birth year]  [facility] of [facility]) CM/SW Contact:    Pollie Friar, RN Phone Number: 09/07/2018, 11:01 AM  Clinical Narrative:                 OT recommending SNF rehab. Pt is refusing. CM reached out to patient's son and he is in agreement with her d/cing home.  Pt is already active with [facility] for [facility] services. Cory with [facility] made aware of resumption orders.  Pt with order for hoyer lift. [employer] will deliver the DME to the home.  Children having issues getting patient into the car. CM included SCAT application in d/c packet.  Pt to [place] home. Timing arranged with bedside RN for 12 pm. PTAR notified and d/c packet at the desk.  NO changes to pts meds.   Addendum: Palliative care consulted CM for outpatient palliative care. CM spoke to pts son and he is agreeable. They asked to use [facility]. Referral made to [facility] with [facility].   Expected Discharge Plan: [place] Barriers to Discharge: No Barriers Identified   Patient Goals and CMS Choice   CMS Medicare.gov Compare Post Acute Care list provided to:: Patient Represenative (must comment) Choice offered to / list presented to : Adult Children(son)  Expected Discharge Plan and Services Expected Discharge Plan: [place]   Discharge Planning Services: CM Consult Post Acute Care Choice: Resumption of Svcs/PTA Provider, Home Health, Durable Medical Equipment Living arrangements for the past 2 months: Mobile Home Expected Discharge Date: 09/07/18               DME Arranged: Other see comment(hoyer lift) DME Agency: [employer] Date DME Agency Contacted: 09/07/18   Representative spoke with at DME Agency: Jeneen Rinks HH Arranged: RN, PT, OT, Nurse's Aide, Social Work [employer] Agency: [place] Date [place]: 09/07/18   Representative spoke  with at [place]: cory  Prior Living Arrangements/Services Living arrangements for the past 2 months: Mobile Home Lives with:: Adult Children Patient language and need for interpreter reviewed:: Yes(no needs) Do you feel safe going back to the place where you live?: Yes      Need for Family Participation in Patient Care: Yes (Comment) Care giver support system in place?: Yes (comment)(son and daughter provide needed care at home.) Current home services: DME(oxygen at 5 L/ wheelchair) Criminal Activity/Legal Involvement Pertinent to Current Situation/Hospitalization: No - Comment as needed  Activities of Daily Living      Permission Sought/Granted                  Emotional Assessment Appearance:: Appears stated age Attitude/Demeanor/Rapport: Lethargic Affect (typically observed): Calm Orientation: : Oriented to Self, Oriented to Place   Psych Involvement: No (comment)  Admission diagnosis:  Subdural hematoma ([place]) [S06.5X9A] Patient Active Problem List   Diagnosis Date Noted  . SDH (subdural hematoma) ([place]) 09/06/2018  . Chronic respiratory failure with hypoxia, on home O2 therapy ([place]) 09/06/2018  . Subdural hematoma ([place]) 08/22/2018  . Elevated troponin 08/22/2018  . ESRD (end stage renal disease) ([place]) 08/22/2018  . Anxiety 07/04/2018  . Counseling regarding advanced directives and goals of care 07/01/2018  . Full code status 07/01/2018  . Drug-induced constipation 07/01/2018  . Hemodialysis-associated hypotension 07/01/2018  . Dialysis patient ([place]) 07/01/2018  . HOH (hard of hearing) 07/01/2018  . Stage 5 chronic  kidney disease on chronic dialysis ([place]) 07/01/2018  . Type 2 diabetes mellitus with diabetic polyneuropathy, with long-term current use of insulin ([place]) 07/01/2018  . Hypothyroidism 07/01/2018  . Gait instability 07/01/2018  . Hyperkalemia 04/20/2018   PCP:  Flossie Buffy, NP Pharmacy:   [facility] [place], [place] - [address] [place] [address] Phone: [phone] Fax: [phone]     Social Determinants of Health (SDOH) Interventions    Readmission Risk Interventions No flowsheet data found.

## 2018-09-07 NOTE — Progress Notes (Signed)
[facility] Progress Note   Subjective:   Patient seen in room. Reports she is feeling well and is going home. Patient continues to decline SNF. She denies SOB, dyspnea, CP, palpitations, abdominal pain, N/V/D. Says she does not like dialysis but understands that it is necessary.   Objective Vitals:   09/07/18 0042 09/07/18 0314 09/07/18 0500 09/07/18 0743  BP: 115/62 107/70  121/64  Pulse: 87 88  98  Resp:    20  Temp:  98.6 F (37 C)  98.2 F (36.8 C)  TempSrc:  Oral  Oral  SpO2:  92%  93%  Weight:   112.5 kg   Height:       Physical Exam General: Well developed, chronically ill appearing female in NAD Lungs: Clear bilaterally to auscultation without wheezes, rales, or rhonchi. Breathing is unlabored. Heart: RRR with normal S1, S2. No murmurs, rubs, or gallops appreciated. Abdomen: Soft, non-tender, non-distended with normoactive bowel sounds. No rebound/guarding. No obvious abdominal masses. Lower extremities: no edema b/l lower extremities Dialysis Access: R IJ TDC no surrounding erythema/drainage   Additional Objective Labs: Basic Metabolic Panel: Recent Labs  Lab 09/06/18 0744 09/07/18 0353  NA 135 135  K 5.4* 4.8  CL 92* 95*  CO2 24 27  GLUCOSE 237* 118*  BUN 74* 32*  CREATININE 13.46* 7.69*  CALCIUM 8.7* 9.0   CBC: Recent Labs  Lab 09/06/18 0744 09/07/18 0353  WBC 11.8* 10.1  NEUTROABS 9.4*  --   HGB 9.4* 9.2*  HCT 30.9* 30.0*  MCV 96.6 97.1  PLT 171 161   Blood Culture    Component Value Date/Time   SDES URINE, CLEAN CATCH 04/20/2018 0300   SPECREQUEST  04/20/2018 0300    NONE Performed at [facility] [address] [place], [place] [address]    CULT MULTIPLE SPECIES PRESENT, SUGGEST RECOLLECTION (A) 04/20/2018 0300   REPTSTATUS 04/21/2018 FINAL 04/20/2018 0300    Cardiac Enzymes: No results for input(s): CKTOTAL, CKMB, CKMBINDEX, TROPONINI in the last 168 hours. CBG: Recent Labs  Lab 09/06/18 0754  09/06/18 1152 09/06/18 1832 09/06/18 2118 09/07/18 0605  GLUCAP 176* 128* 83 142* 122*    Studies/Results: Ct Head Wo Contrast  Result Date: 09/06/2018 CLINICAL DATA:  Altered mental status and fatigue. Recent subdural hematomas EXAM: CT HEAD WITHOUT CONTRAST TECHNIQUE: Contiguous axial images were obtained from the base of the skull through the vertex without intravenous contrast. COMPARISON:  August 25, 2018 FINDINGS: Brain: There is age related volume loss. There remains a subdural hematoma in the right frontal region with both acute and more chronic appearing fluid. There remains a degree of mass effect on the right frontal lobe, slightly less than on the previous study. The current maximum thickness of the subdural hematoma on the right measures 1.4 cm compared to a maximum thickness of 1.6 cm. There is a subdural hematoma on the left in the frontal and parietal regions which also has mixed attenuation consistent with both relatively acute and more subacute to chronic hemorrhage. The maximum thickness of this subdural hematoma measures 1.3 cm compared to 1.6 cm on most recent study. No new hemorrhage or new extra-axial fluid collections are evident. There is no intra-axial mass or hemorrhage. Currently there is 2 mm of midline shift to the right compared to 5 mm of midline shift to the right on most recent study. There is small vessel disease in the centra semiovale bilaterally. No acute infarct is appreciable. Vascular: No hyperdense vessels are evident.  There is calcification in each carotid siphon region and distal left vertebral artery. Skull: The bony calvarium appears intact. Sinuses/Orbits: There is opacification throughout the right maxillary antrum. There are more scattered areas of opacification in the left maxillary antrum with probable polyp superiorly. There is mucosal thickening in several ethmoid air cells as well as in the right frontal sinus region. Orbits appear symmetric bilaterally.  Other: There is opacification of several inferior mastoid air cells bilaterally, stable. IMPRESSION: 1. There remain bilateral subdural hematomas with both more acute and more chronic appearing fluid on each side. The right subdural hematoma is seen in the right frontal region with mild mass effect on the right frontal lobe. This subdural hematoma at its maximum measures 1.4 cm compared to a thickness of 1.6 cm on the previous study. On the left, the subdural hematoma is slightly larger and involves frontal and parietal regions. The mixed attenuation in the fluid remains stable. The maximum thickness on the left currently measures 1.3 cm compared to 1.6 cm on the previous study. There appears to be slightly less mass effect from the subdural hematomas compared to recent prior study. There is 2 mm of midline shift to the right compared to 5 mm of midline shift to the right on the previous study. 2. No intra-axial hemorrhage. There is patchy periventricular small vessel disease. No acute infarct evident. 3.  Foci of arterial vascular calcification noted. 4. Multifocal paranasal sinus disease again noted. Mild inferior mastoid disease bilaterally noted. Electronically Signed   By: Lowella Grip III M.D.   On: 09/06/2018 08:43   Medications:  . calcitRIOL  1.5 mcg Oral Q T,Th,Sa-HD  . Chlorhexidine Gluconate Cloth  6 each Topical Q0600  . cinacalcet  60 mg Oral Q T,Th,Sa-HD  . FLUoxetine  10 mg Oral Daily  . furosemide  80 mg Oral Once per day on Sun Mon Wed Fri  . insulin aspart  0-5 Units Subcutaneous QHS  . insulin aspart  0-9 Units Subcutaneous TID WC  . insulin detemir  15 Units Subcutaneous Daily  . ipratropium  0.5 mg Inhalation QID  . levothyroxine  112 mcg Oral QAC breakfast  . [START ON 09/08/2018] midodrine  10 mg Oral 3 times per day on Tue Thu Sat  . montelukast  10 mg Oral QHS  . rosuvastatin  40 mg Oral q1800  . senna-docusate  1 tablet Oral QHS  . sevelamer carbonate  2,400 mg Oral  TID WC    Dialysis Orders: Center: [facility] on TTS. T: 4 hr; 180NRe; DFR 400/DFR auto 1.5; EDW 105.5kg, 2K/2Ca No heparin bolus Calcitriol 1.5 mcg PO qHD Sensipar 60 mg PO qHD renvela 800mg  3 tabs AC Midodrine 10 mg PO TIW before HD and mid treatment for SBP <95  Assessment/Plan: 1.  Subdural hematoma: Neurosurgery following, recommended monitoring and repeat head CT and now cleared for discharge with outpatient follow up. She reportedly has not desired surgery due to anesthesia risks associated with COPD. No heparin with dialysis.  2.  ESRD:  TTS schedule. 4.8. Continue outpatient HD. Shortening treatments may be contributing to hyperkalemia.  3.  Hypertension/volume: Had HD yesterday with net UF 2.5L. Still 7kg above her EDW but in no respiratory distress (on O2 at baseline). 1.2L fluid restriction. Encourage compliance with full HD treatment. Continbue lasix on non-dialysis days 4.  Anemia: Hemoglobin 9.2.  Received aranesp 68mcg with HD yesterday. Will start low dose ESA at discharge. 5.  Metabolic bone disease: Calcium 8.7,  Continue calcitriol and sensipar. Phos poorly controlled as an outpatient. Continue renvela. 6.  Nutrition:  Renal diet with fluid restrictions.  7. COPD: On 5L O2 [place] at baseline. Continue meds per primary. 8. Goals of care: Full code, receptive to palliative care consult 9. Diabetes mellitus: On insulin, per primary  Anice Paganini, PA-C 09/07/2018, 11:07 AM  [facility] Pager: [phone]

## 2018-09-07 NOTE — Care Management Obs Status (Signed)
[place] NOTIFICATION   Patient Details  Name: Joan Mosley MRN: [id number] Date of Birth: 1945-04-19   Medicare Observation Status Notification Given:  Yes    Pollie Friar, RN 09/07/2018, 11:09 AM

## 2018-09-07 NOTE — Progress Notes (Signed)
Inpatient Rehabilitation Admissions Coordinator  Noted therapy recs for SNF and family declining to go home. Pt not at a level for an inpt rehab admit. We will sign off at this time.  Danne Baxter, RN, MSN Rehab Admissions Coordinator [phone] 09/07/2018 11:49 AM

## 2018-09-07 NOTE — Plan of Care (Signed)

## 2018-09-08 ENCOUNTER — Other Ambulatory Visit: Payer: Self-pay

## 2018-09-08 ENCOUNTER — Emergency Department (HOSPITAL_COMMUNITY): Payer: Medicare Other

## 2018-09-08 ENCOUNTER — Inpatient Hospital Stay (HOSPITAL_COMMUNITY)
Admission: EM | Admit: 2018-09-08 | Discharge: 2018-09-16 | DRG: 190 | Disposition: A | Discharge status: Home Health | Payer: Medicare Other | Attending: Family Medicine | Admitting: Family Medicine

## 2018-09-08 DIAGNOSIS — Z515 Encounter for palliative care: Secondary | ICD-10-CM | POA: Diagnosis not present

## 2018-09-08 DIAGNOSIS — Z9981 Dependence on supplemental oxygen: Secondary | ICD-10-CM | POA: Diagnosis not present

## 2018-09-08 DIAGNOSIS — Z6841 Body Mass Index (BMI) 40.0 and over, adult: Secondary | ICD-10-CM | POA: Diagnosis not present

## 2018-09-08 DIAGNOSIS — E1122 Type 2 diabetes mellitus with diabetic chronic kidney disease: Secondary | ICD-10-CM | POA: Diagnosis present

## 2018-09-08 DIAGNOSIS — N2581 Secondary hyperparathyroidism of renal origin: Secondary | ICD-10-CM | POA: Diagnosis present

## 2018-09-08 DIAGNOSIS — F329 Major depressive disorder, single episode, unspecified: Secondary | ICD-10-CM | POA: Diagnosis present

## 2018-09-08 DIAGNOSIS — Z79899 Other long term (current) drug therapy: Secondary | ICD-10-CM | POA: Diagnosis not present

## 2018-09-08 DIAGNOSIS — Z794 Long term (current) use of insulin: Secondary | ICD-10-CM | POA: Diagnosis not present

## 2018-09-08 DIAGNOSIS — J9621 Acute and chronic respiratory failure with hypoxia: Secondary | ICD-10-CM

## 2018-09-08 DIAGNOSIS — Z8673 Personal history of transient ischemic attack (TIA), and cerebral infarction without residual deficits: Secondary | ICD-10-CM | POA: Diagnosis not present

## 2018-09-08 DIAGNOSIS — Z66 Do not resuscitate: Secondary | ICD-10-CM | POA: Diagnosis present

## 2018-09-08 DIAGNOSIS — J441 Chronic obstructive pulmonary disease with (acute) exacerbation: Secondary | ICD-10-CM | POA: Diagnosis present

## 2018-09-08 DIAGNOSIS — R627 Adult failure to thrive: Secondary | ICD-10-CM | POA: Diagnosis present

## 2018-09-08 DIAGNOSIS — R296 Repeated falls: Secondary | ICD-10-CM | POA: Diagnosis present

## 2018-09-08 DIAGNOSIS — N186 End stage renal disease: Secondary | ICD-10-CM | POA: Diagnosis present

## 2018-09-08 DIAGNOSIS — J9690 Respiratory failure, unspecified, unspecified whether with hypoxia or hypercapnia: Secondary | ICD-10-CM | POA: Diagnosis present

## 2018-09-08 DIAGNOSIS — R5381 Other malaise: Secondary | ICD-10-CM | POA: Insufficient documentation

## 2018-09-08 DIAGNOSIS — I272 Pulmonary hypertension, unspecified: Secondary | ICD-10-CM | POA: Diagnosis present

## 2018-09-08 DIAGNOSIS — I248 Other forms of acute ischemic heart disease: Secondary | ICD-10-CM | POA: Diagnosis present

## 2018-09-08 DIAGNOSIS — S065XAA Traumatic subdural hemorrhage with loss of consciousness status unknown, initial encounter: Secondary | ICD-10-CM | POA: Diagnosis present

## 2018-09-08 DIAGNOSIS — S065X9A Traumatic subdural hemorrhage with loss of consciousness of unspecified duration, initial encounter: Secondary | ICD-10-CM | POA: Diagnosis present

## 2018-09-08 DIAGNOSIS — F419 Anxiety disorder, unspecified: Secondary | ICD-10-CM | POA: Diagnosis present

## 2018-09-08 DIAGNOSIS — E875 Hyperkalemia: Secondary | ICD-10-CM | POA: Diagnosis present

## 2018-09-08 DIAGNOSIS — Z87891 Personal history of nicotine dependence: Secondary | ICD-10-CM | POA: Diagnosis not present

## 2018-09-08 DIAGNOSIS — J9610 Chronic respiratory failure, unspecified whether with hypoxia or hypercapnia: Secondary | ICD-10-CM | POA: Diagnosis present

## 2018-09-08 DIAGNOSIS — Z79890 Hormone replacement therapy (postmenopausal): Secondary | ICD-10-CM

## 2018-09-08 DIAGNOSIS — Q211 Atrial septal defect: Secondary | ICD-10-CM | POA: Diagnosis not present

## 2018-09-08 DIAGNOSIS — D631 Anemia in chronic kidney disease: Secondary | ICD-10-CM | POA: Diagnosis present

## 2018-09-08 DIAGNOSIS — Z993 Dependence on wheelchair: Secondary | ICD-10-CM

## 2018-09-08 DIAGNOSIS — E8770 Fluid overload, unspecified: Secondary | ICD-10-CM

## 2018-09-08 DIAGNOSIS — E1151 Type 2 diabetes mellitus with diabetic peripheral angiopathy without gangrene: Secondary | ICD-10-CM | POA: Diagnosis present

## 2018-09-08 DIAGNOSIS — R531 Weakness: Secondary | ICD-10-CM | POA: Diagnosis present

## 2018-09-08 DIAGNOSIS — E785 Hyperlipidemia, unspecified: Secondary | ICD-10-CM | POA: Diagnosis present

## 2018-09-08 DIAGNOSIS — R0902 Hypoxemia: Secondary | ICD-10-CM

## 2018-09-08 DIAGNOSIS — Z992 Dependence on renal dialysis: Secondary | ICD-10-CM

## 2018-09-08 DIAGNOSIS — E8889 Other specified metabolic disorders: Secondary | ICD-10-CM | POA: Diagnosis present

## 2018-09-08 DIAGNOSIS — Z96653 Presence of artificial knee joint, bilateral: Secondary | ICD-10-CM | POA: Diagnosis present

## 2018-09-08 DIAGNOSIS — E890 Postprocedural hypothyroidism: Secondary | ICD-10-CM | POA: Diagnosis present

## 2018-09-08 DIAGNOSIS — Z90710 Acquired absence of both cervix and uterus: Secondary | ICD-10-CM | POA: Diagnosis not present

## 2018-09-08 DIAGNOSIS — I959 Hypotension, unspecified: Secondary | ICD-10-CM | POA: Diagnosis present

## 2018-09-08 DIAGNOSIS — J811 Chronic pulmonary edema: Secondary | ICD-10-CM | POA: Insufficient documentation

## 2018-09-08 DIAGNOSIS — E1142 Type 2 diabetes mellitus with diabetic polyneuropathy: Secondary | ICD-10-CM | POA: Diagnosis present

## 2018-09-08 DIAGNOSIS — J9600 Acute respiratory failure, unspecified whether with hypoxia or hypercapnia: Secondary | ICD-10-CM | POA: Diagnosis not present

## 2018-09-08 DIAGNOSIS — Z7189 Other specified counseling: Secondary | ICD-10-CM

## 2018-09-08 DIAGNOSIS — Z20828 Contact with and (suspected) exposure to other viral communicable diseases: Secondary | ICD-10-CM | POA: Diagnosis present

## 2018-09-08 LAB — CBC
HCT: 28.3 % — ABNORMAL LOW (ref 36.0–46.0)
Hemoglobin: 8.5 g/dL — ABNORMAL LOW (ref 12.0–15.0)
MCH: 29.9 pg (ref 26.0–34.0)
MCHC: 30 g/dL (ref 30.0–36.0)
MCV: 99.6 fL (ref 80.0–100.0)
Platelets: 151 10*3/uL (ref 150–400)
RBC: 2.84 MIL/uL — ABNORMAL LOW (ref 3.87–5.11)
RDW: 15.4 % (ref 11.5–15.5)
WBC: 11.7 10*3/uL — ABNORMAL HIGH (ref 4.0–10.5)
nRBC: 0 % (ref 0.0–0.2)

## 2018-09-08 LAB — POCT I-STAT EG7
Acid-Base Excess: 2 mmol/L (ref 0.0–2.0)
Bicarbonate: 30.4 mmol/L — ABNORMAL HIGH (ref 20.0–28.0)
Calcium, Ion: 1.08 mmol/L — ABNORMAL LOW (ref 1.15–1.40)
HCT: 34 % — ABNORMAL LOW (ref 36.0–46.0)
Hemoglobin: 11.6 g/dL — ABNORMAL LOW (ref 12.0–15.0)
O2 Saturation: 98 %
Potassium: 5.1 mmol/L (ref 3.5–5.1)
Sodium: 134 mmol/L — ABNORMAL LOW (ref 135–145)
TCO2: 32 mmol/L (ref 22–32)
pCO2, Ven: 65 mmHg — ABNORMAL HIGH (ref 44.0–60.0)
pH, Ven: 7.278 (ref 7.250–7.430)
pO2, Ven: 126 mmHg — ABNORMAL HIGH (ref 32.0–45.0)

## 2018-09-08 LAB — COMPREHENSIVE METABOLIC PANEL
ALT: 11 U/L (ref 0–44)
AST: 10 U/L — ABNORMAL LOW (ref 15–41)
Albumin: 3.1 g/dL — ABNORMAL LOW (ref 3.5–5.0)
Alkaline Phosphatase: 89 U/L (ref 38–126)
Anion gap: 16 — ABNORMAL HIGH (ref 5–15)
BUN: 51 mg/dL — ABNORMAL HIGH (ref 8–23)
CO2: 28 mmol/L (ref 22–32)
Calcium: 8.8 mg/dL — ABNORMAL LOW (ref 8.9–10.3)
Chloride: 93 mmol/L — ABNORMAL LOW (ref 98–111)
Creatinine, Ser: 10.49 mg/dL — ABNORMAL HIGH (ref 0.44–1.00)
GFR calc Af Amer: 4 mL/min — ABNORMAL LOW (ref >60)
GFR calc non Af Amer: 3 mL/min — ABNORMAL LOW (ref >60)
Glucose, Bld: 83 mg/dL (ref 70–99)
Potassium: 5.2 mmol/L — ABNORMAL HIGH (ref 3.5–5.1)
Sodium: 137 mmol/L (ref 135–145)
Total Bilirubin: 0.7 mg/dL (ref 0.3–1.2)
Total Protein: 6.7 g/dL (ref 6.5–8.1)

## 2018-09-08 LAB — LACTIC ACID, PLASMA: Lactic Acid, Venous: 0.8 mmol/L (ref 0.5–1.9)

## 2018-09-08 LAB — PROTIME-INR
INR: 1.1 (ref 0.8–1.2)
Prothrombin Time: 13.7 seconds (ref 11.4–15.2)

## 2018-09-08 LAB — SARS CORONAVIRUS 2 BY RT PCR (HOSPITAL ORDER, PERFORMED IN ~~LOC~~ HOSPITAL LAB): SARS Coronavirus 2: NEGATIVE

## 2018-09-08 LAB — APTT: aPTT: 34 seconds (ref 24–36)

## 2018-09-08 MED ORDER — LIDOCAINE-PRILOCAINE 2.5-2.5 % EX CREA
1.0000 "application " | TOPICAL_CREAM | CUTANEOUS | Status: DC | PRN
  Filled 2018-09-08: qty 5

## 2018-09-08 MED ORDER — LEVOTHYROXINE SODIUM 112 MCG PO TABS
112.0000 ug | ORAL_TABLET | Freq: Every day | ORAL | Status: DC
  Administered 2018-09-09 – 2018-09-16 (×7): 112 ug via ORAL
  Filled 2018-09-08 (×8): qty 1

## 2018-09-08 MED ORDER — CHLORHEXIDINE GLUCONATE CLOTH 2 % EX PADS
6.0000 | MEDICATED_PAD | Freq: Every day | CUTANEOUS | Status: DC
  Administered 2018-09-10 – 2018-09-16 (×5): 6 via TOPICAL

## 2018-09-08 MED ORDER — ALBUTEROL SULFATE (2.5 MG/3ML) 0.083% IN NEBU
2.5000 mg | INHALATION_SOLUTION | Freq: Four times a day (QID) | RESPIRATORY_TRACT | Status: DC
  Administered 2018-09-09 – 2018-09-11 (×9): 2.5 mg via RESPIRATORY_TRACT
  Filled 2018-09-08 (×10): qty 3

## 2018-09-08 MED ORDER — MONTELUKAST SODIUM 10 MG PO TABS
10.0000 mg | ORAL_TABLET | Freq: Every day | ORAL | Status: DC
  Administered 2018-09-09 – 2018-09-15 (×8): 10 mg via ORAL
  Filled 2018-09-08 (×8): qty 1

## 2018-09-08 MED ORDER — SODIUM CHLORIDE 0.9 % IV SOLN: 250.0000 mL | INTRAVENOUS | Status: DC | PRN

## 2018-09-08 MED ORDER — FLUOXETINE HCL 10 MG PO CAPS
10.0000 mg | ORAL_CAPSULE | Freq: Every day | ORAL | Status: DC
  Administered 2018-09-11 – 2018-09-16 (×6): 10 mg via ORAL
  Filled 2018-09-08 (×8): qty 1

## 2018-09-08 MED ORDER — SODIUM CHLORIDE 0.9 % IV SOLN: 100.0000 mL | INTRAVENOUS | Status: DC | PRN

## 2018-09-08 MED ORDER — HEPARIN SODIUM (PORCINE) 5000 UNIT/ML IJ SOLN: 5000.0000 [IU] | Freq: Two times a day (BID) | INTRAMUSCULAR | Status: DC

## 2018-09-08 MED ORDER — SEVELAMER CARBONATE 800 MG PO TABS: 800.0000 mg | ORAL_TABLET | Freq: Three times a day (TID) | ORAL | Status: DC

## 2018-09-08 MED ORDER — IPRATROPIUM BROMIDE 0.02 % IN SOLN: 0.5000 mg | Freq: Four times a day (QID) | RESPIRATORY_TRACT | Status: DC

## 2018-09-08 MED ORDER — SODIUM CHLORIDE 0.9% FLUSH
3.0000 mL | Freq: Two times a day (BID) | INTRAVENOUS | Status: DC
  Administered 2018-09-09 – 2018-09-16 (×13): 3 mL via INTRAVENOUS

## 2018-09-08 MED ORDER — CINACALCET HCL 30 MG PO TABS
60.0000 mg | ORAL_TABLET | ORAL | Status: DC
  Administered 2018-09-10 – 2018-09-15 (×3): 60 mg via ORAL
  Filled 2018-09-08 (×3): qty 2

## 2018-09-08 MED ORDER — ALBUTEROL SULFATE (2.5 MG/3ML) 0.083% IN NEBU
2.5000 mg | INHALATION_SOLUTION | RESPIRATORY_TRACT | Status: DC | PRN
  Administered 2018-09-15: 2.5 mg via RESPIRATORY_TRACT
  Filled 2018-09-08: qty 3

## 2018-09-08 MED ORDER — SODIUM CHLORIDE 0.9% FLUSH: 3.0000 mL | INTRAVENOUS | Status: DC | PRN

## 2018-09-08 MED ORDER — SENNOSIDES-DOCUSATE SODIUM 8.6-50 MG PO TABS
1.0000 | ORAL_TABLET | Freq: Every day | ORAL | Status: DC
  Administered 2018-09-09 – 2018-09-16 (×7): 1 via ORAL
  Filled 2018-09-08 (×8): qty 1

## 2018-09-08 MED ORDER — PENTAFLUOROPROP-TETRAFLUOROETH EX AERO
1.0000 "application " | INHALATION_SPRAY | CUTANEOUS | Status: DC | PRN
  Filled 2018-09-08: qty 116

## 2018-09-08 MED ORDER — ROSUVASTATIN CALCIUM 20 MG PO TABS
40.0000 mg | ORAL_TABLET | Freq: Every day | ORAL | Status: DC
  Administered 2018-09-11 – 2018-09-16 (×6): 40 mg via ORAL
  Filled 2018-09-08 (×6): qty 2

## 2018-09-08 MED ORDER — INSULIN DETEMIR 100 UNIT/ML ~~LOC~~ SOLN
15.0000 [IU] | Freq: Every day | SUBCUTANEOUS | Status: DC
  Administered 2018-09-11 – 2018-09-12 (×2): 15 [IU] via SUBCUTANEOUS
  Filled 2018-09-08 (×5): qty 0.15

## 2018-09-08 MED ORDER — FUROSEMIDE 80 MG PO TABS: 80.0000 mg | ORAL_TABLET | ORAL | Status: DC

## 2018-09-08 MED ORDER — MIDODRINE HCL 5 MG PO TABS
ORAL_TABLET | ORAL | Status: AC
  Filled 2018-09-08: qty 2

## 2018-09-08 MED ORDER — CALCITRIOL 0.25 MCG PO CAPS: 0.2500 ug | ORAL_CAPSULE | Freq: Every day | ORAL | Status: DC

## 2018-09-08 MED ORDER — MIDODRINE HCL 5 MG PO TABS: 10.0000 mg | ORAL_TABLET | Freq: Three times a day (TID) | ORAL | Status: DC

## 2018-09-08 MED ORDER — LIDOCAINE HCL (PF) 1 % IJ SOLN: 5.0000 mL | INTRAMUSCULAR | Status: DC | PRN

## 2018-09-08 MED ORDER — SEVELAMER CARBONATE 800 MG PO TABS
2400.0000 mg | ORAL_TABLET | Freq: Three times a day (TID) | ORAL | Status: DC
  Administered 2018-09-09 – 2018-09-16 (×17): 2400 mg via ORAL
  Filled 2018-09-08 (×17): qty 3

## 2018-09-08 MED ORDER — IPRATROPIUM BROMIDE 0.02 % IN SOLN
0.5000 mg | Freq: Four times a day (QID) | RESPIRATORY_TRACT | Status: DC
  Administered 2018-09-09 – 2018-09-11 (×9): 0.5 mg via RESPIRATORY_TRACT
  Filled 2018-09-08 (×10): qty 2.5

## 2018-09-08 MED ORDER — LIDOCAINE 5 % EX PTCH: 1.0000 | MEDICATED_PATCH | Freq: Two times a day (BID) | CUTANEOUS | Status: DC | PRN

## 2018-09-08 MED ORDER — MIDODRINE HCL 5 MG PO TABS
10.0000 mg | ORAL_TABLET | Freq: Once | ORAL | Status: AC | PRN
  Administered 2018-09-08: 10 mg via ORAL
  Filled 2018-09-08: qty 2

## 2018-09-08 MED ORDER — HEPARIN SODIUM (PORCINE) 1000 UNIT/ML IJ SOLN
INTRAMUSCULAR | Status: AC
  Administered 2018-09-08: 3700 [IU] via INTRAVENOUS
  Filled 2018-09-08: qty 4

## 2018-09-08 MED ORDER — ACETAMINOPHEN 500 MG PO TABS
1000.0000 mg | ORAL_TABLET | Freq: Four times a day (QID) | ORAL | Status: DC | PRN
  Administered 2018-09-09 – 2018-09-16 (×5): 1000 mg via ORAL
  Filled 2018-09-08 (×5): qty 2

## 2018-09-08 MED ORDER — HEPARIN SODIUM (PORCINE) 1000 UNIT/ML IJ SOLN
1000.0000 [IU] | INTRAMUSCULAR | Status: DC | PRN
  Administered 2018-09-08: 3700 [IU] via INTRAVENOUS
  Filled 2018-09-08 (×2): qty 1

## 2018-09-08 MED ORDER — CLONAZEPAM 1 MG PO TABS
1.0000 mg | ORAL_TABLET | Freq: Two times a day (BID) | ORAL | Status: DC | PRN
  Administered 2018-09-09 – 2018-09-15 (×8): 1 mg via ORAL
  Filled 2018-09-08 (×9): qty 1

## 2018-09-08 MED ORDER — ALBUTEROL SULFATE HFA 108 (90 BASE) MCG/ACT IN AERS: 2.0000 | INHALATION_SPRAY | Freq: Four times a day (QID) | RESPIRATORY_TRACT | Status: DC | PRN

## 2018-09-08 MED ORDER — CALCITRIOL 0.25 MCG PO CAPS
1.5000 ug | ORAL_CAPSULE | ORAL | Status: DC
  Administered 2018-09-10 – 2018-09-15 (×3): 1.5 ug via ORAL
  Filled 2018-09-08 (×2): qty 6

## 2018-09-08 NOTE — ED Triage Notes (Signed)
[facility] reports that the patient has been weak and lethargic for a few days. She did not go to dialysis because she was too weak to walk and did not go today. On 2L [place] she was 89% , so EMS put her on NRB at 10 L and she went to 98%. Patient was not very responsive with EMS but is currently alert and oriented X 2.

## 2018-09-08 NOTE — Progress Notes (Signed)
This RN spoke with Lovey Newcomer, NP, the patient does not need to be placed on the Covid side of the unit, has had 4 negative Covid test results in past month. Patient will be admitted to 2W21 once room is clean and patient has finished dialysis.

## 2018-09-08 NOTE — Consult Note (Signed)
Reason for Consult: To manage dialysis and dialysis related needs Referring Physician: [name] is an 73 y.o. female.   HPI: Pt is a 7F with a PMH sig for ESRD on TTS at [facility], COPD with 5L O2 requirement, DM, subdural hematoma, and h/o TIA who is now seen in consultation at the request of Dr. Sedonia Small Dr Laren Everts for management of ESRD and provision of HD.  Pt was discharged yesterday from [facility] after an admission for weakness/ collapsing.  SNF has been recommended multiple times during past 2 hospitalizations and she has declined.  Her last dialysis was here on Tuesday.  She was reportedly feeling unwell with weakness and lethargy.  She was unable to attend dialysis today and was brought here for evaluation.    EMS brought her in- had found her sats to be 89% and was placed on NRB.  Was weaned down to 5L HFNC O2. Labs reflecting missed HD rx.  CT head unchanged.  CXR with bilateral atalectasis vs consolidation. COVID testing negative. Blood cultures collected. In this setting we are asked to see.     Pt is accompanied by her son.  It is noted that she lives with 2 of her 5 children.  Don't have a ground floor apartment- the dtr and son carry her wheelchair down the stairs so she can get to HD rx.  This has become a struggle.    [facility] T: 4 hr; 180NRe; DFR 400/DFR auto 1.5; EDW 105.5kg, 2K/2Ca No heparin bolus Calcitriol 1.5 mcg PO qHD Sensipar 60 mg PO qHD renvela 800mg  3 tabs AC Midodrine 10 mg PO TIW before HD and mid treatment for SBP <95  Past Medical History:  Diagnosis Date  . Arthritis   . COPD (chronic obstructive pulmonary disease) ([place])   . Diabetes mellitus without complication ([place])   . Diabetic neuropathy ([place])   . 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 Renal disorder    ESRD  . Thyroid disease   . TIA (transient ischemic attack)     Past Surgical History:  Procedure Laterality Date  . ABDOMINAL HYSTERECTOMY     . RECTOCELE REPAIR    . REPLACEMENT TOTAL KNEE BILATERAL Bilateral 2008  . THYROIDECTOMY     80%  . VAGINAL WOUND CLOSURE / REPAIR      Family History  Problem Relation Age of Onset  . Heart disease Mother   . Hypertension Mother   . Heart disease Father   . Hypertension Father     Social History:  reports that she quit smoking about 37 years ago. She has a 35.00 pack-year smoking history. She has never used smokeless tobacco. She reports that she does not drink alcohol or use drugs.  Allergies:  Allergies  Allergen Reactions  . Avelox [Moxifloxacin Hcl In Nacl]     PT does not remember  . Codeine Anaphylaxis    PT does not remember  . Other     PT does not remember  . Penicillins     Rash  . Sulfa Antibiotics     PT does nolt remember  . Shellfish Allergy Hives, Itching and Rash    Medications:  Scheduled: . [START ON 09/09/2018] Chlorhexidine Gluconate Cloth  6 each Topical Q0600  . FLUoxetine  10 mg Oral Daily  . furosemide  80 mg Oral See admin instructions  . insulin detemir  15 Units Subcutaneous Daily  . midodrine  Results for orders placed or performed during the hospital encounter of 09/08/18 (from the past 48 hour(s))  Culture, blood (Routine X 2) w Reflex to ID Panel     Status: None (Preliminary result)   Collection Time: 09/08/18  2:40 PM   Specimen: BLOOD  Result Value Ref Range   Specimen Description BLOOD BLOOD RIGHT FOREARM    Special Requests      BOTTLES DRAWN AEROBIC ONLY Blood Culture results may not be optimal due to an inadequate volume of blood received in culture bottles Performed at [facility] [address], [place], [place] [address]    Culture PENDING    Report Status PENDING   CBC     Status: Abnormal   Collection Time: 09/08/18  3:30 PM  Result Value Ref Range   WBC 11.7 (H) 4.0 - 10.5 K/uL   RBC 2.84 (L) 3.87 - 5.11 MIL/uL   Hemoglobin 8.5 (L) 12.0 - 15.0 g/dL   HCT 28.3 (L) 36.0 - 46.0 %   MCV 99.6 80.0 -  100.0 fL   MCH 29.9 26.0 - 34.0 pg   MCHC 30.0 30.0 - 36.0 g/dL   RDW 15.4 11.5 - 15.5 %   Platelets 151 150 - 400 K/uL   nRBC 0.0 0.0 - 0.2 %    Comment: Performed at [facility], [place] [address] [place], [place] [address]  Comprehensive metabolic panel     Status: Abnormal   Collection Time: 09/08/18  3:30 PM  Result Value Ref Range   Sodium 137 135 - 145 mmol/L   Potassium 5.2 (H) 3.5 - 5.1 mmol/L   Chloride 93 (L) 98 - 111 mmol/L   CO2 28 22 - 32 mmol/L   Glucose, Bld 83 70 - 99 mg/dL   BUN 51 (H) 8 - 23 mg/dL   Creatinine, Ser 10.49 (H) 0.44 - 1.00 mg/dL   Calcium 8.8 (L) 8.9 - 10.3 mg/dL   Total Protein 6.7 6.5 - 8.1 g/dL   Albumin 3.1 (L) 3.5 - 5.0 g/dL   AST 10 (L) 15 - 41 U/L   ALT 11 0 - 44 U/L   Alkaline Phosphatase 89 38 - 126 U/L   Total Bilirubin 0.7 0.3 - 1.2 mg/dL   GFR calc non Af Amer 3 (L) >60 mL/min   GFR calc Af Amer 4 (L) >60 mL/min   Anion gap 16 (H) 5 - 15    Comment: Performed at [facility], [place] [address], [place], [place] [address]  Lactic acid, plasma     Status: None   Collection Time: 09/08/18  3:30 PM  Result Value Ref Range   Lactic Acid, Venous 0.8 0.5 - 1.9 mmol/L    Comment: Performed at [facility] [address], [place], [place] [address]  APTT     Status: None   Collection Time: 09/08/18  3:30 PM  Result Value Ref Range   aPTT 34 24 - 36 seconds    Comment: Performed at [facility] [address], [place], [place] [address]  Protime-INR     Status: None   Collection Time: 09/08/18  3:30 PM  Result Value Ref Range   Prothrombin Time 13.7 11.4 - 15.2 seconds   INR 1.1 0.8 - 1.2    Comment: (NOTE) INR goal varies based on device and disease states. Performed at [facility], [place] [address], [place], [place] [address]   POCT I-Stat EG7  Status: Abnormal   Collection Time: 09/08/18  3:41 PM  Result Value Ref Range   pH, Ven 7.278 7.250 - 7.430   pCO2, Ven 65.0 (H) 44.0 - 60.0 mmHg   pO2, Ven  126.0 (H) 32.0 - 45.0 mmHg   Bicarbonate 30.4 (H) 20.0 - 28.0 mmol/L   TCO2 32 22 - 32 mmol/L   O2 Saturation 98.0 %   Acid-Base Excess 2.0 0.0 - 2.0 mmol/L   Sodium 134 (L) 135 - 145 mmol/L   Potassium 5.1 3.5 - 5.1 mmol/L   Calcium, Ion 1.08 (L) 1.15 - 1.40 mmol/L   HCT 34.0 (L) 36.0 - 46.0 %   Hemoglobin 11.6 (L) 12.0 - 15.0 g/dL   Patient temperature HIDE    Collection site BRACHIAL ARTERY    Sample type VENOUS   SARS Coronavirus 2 [facility] order, Performed in [facility] hospital lab) Nasopharyngeal Nasopharyngeal Swab     Status: None   Collection Time: 09/08/18  4:10 PM   Specimen: Nasopharyngeal Swab  Result Value Ref Range   SARS Coronavirus 2 NEGATIVE NEGATIVE    Comment: (NOTE) If result is NEGATIVE SARS-CoV-2 target nucleic acids are NOT DETECTED. The SARS-CoV-2 RNA is generally detectable in upper and lower  respiratory specimens during the acute phase of infection. The lowest  concentration of SARS-CoV-2 viral copies this assay can detect is 250  copies / mL. A negative result does not preclude SARS-CoV-2 infection  and should not be used as the sole basis for treatment or other  patient management decisions.  A negative result may occur with  improper specimen collection / handling, submission of specimen other  than nasopharyngeal swab, presence of viral mutation(s) within the  areas targeted by this assay, and inadequate number of viral copies  (<250 copies / mL). A negative result must be combined with clinical  observations, patient history, and epidemiological information. If result is POSITIVE SARS-CoV-2 target nucleic acids are DETECTED. The SARS-CoV-2 RNA is generally detectable in upper and lower  respiratory specimens dur ing the acute phase of infection.  Positive  results are indicative of active infection with SARS-CoV-2.  Clinical  correlation with patient history and other diagnostic information is  necessary to determine patient infection status.   Positive results do  not rule out bacterial infection or co-infection with other viruses. If result is PRESUMPTIVE POSTIVE SARS-CoV-2 nucleic acids MAY BE PRESENT.   A presumptive positive result was obtained on the submitted specimen  and confirmed on repeat testing.  While 2019 novel coronavirus  (SARS-CoV-2) nucleic acids may be present in the submitted sample  additional confirmatory testing may be necessary for epidemiological  and / or clinical management purposes  to differentiate between  SARS-CoV-2 and other Sarbecovirus currently known to infect humans.  If clinically indicated additional testing with an alternate test  methodology [id number]) is advised. The SARS-CoV-2 RNA is generally  detectable in upper and lower respiratory sp ecimens during the acute  phase of infection. The expected result is Negative. Fact Sheet for Patients:  StrictlyIdeas.no Fact Sheet for Healthcare Providers: BankingDealers.co.za This test is not yet approved or cleared by the [place] FDA and has been authorized for detection and/or diagnosis of SARS-CoV-2 by FDA under an Emergency Use Authorization (EUA).  This EUA will remain in effect (meaning this test can be used) for the duration of the COVID-19 declaration under Section 564(b)(1) of the Act, 21 U.S.C. section 360bbb-3(b)(1), unless the authorization is terminated or revoked sooner. Performed at [facility], [place] [address], [place], [place] [address]     Ct Head Wo Contrast  Result Date: 09/08/2018 CLINICAL DATA:  Unexplained altered level of consciousness. Week. Lethargic. Dialysis patient. EXAM: CT HEAD WITHOUT CONTRAST TECHNIQUE: Contiguous axial images were obtained from the base of the skull through the vertex without intravenous contrast. COMPARISON:  09/06/2018 FINDINGS: Brain: No change over the last 2 days. Bilateral mixed density subdural hematomas, approximately 1 cm  thickness on each side. No evidence of enlargement or additional hyperdense bleeding. Because there is brain atrophy, there is only minimal mass effect. Left-to-right midline shift of 2 mm. No intraparenchymal hemorrhage. No acute parenchymal infarction. Chronic small-vessel ischemic changes affecting the pons, thalami and hemispheric white matter. Vascular: There is atherosclerotic calcification of the major vessels at the base of the brain. Skull: Negative Sinuses/Orbits: Chronic right maxillary sinusitis. Mucosal inflammation of the frontal and ethmoid regions on the right as well. Other: None IMPRESSION: No significant change over the last 2 days. Mixed density bilateral convexity subdural hematomas, proximal thickness 1 cm on each side. No additional hyperdense bleeding. Minimal mass effect with left-to-right shift of 2 mm. Electronically Signed   By: Nelson Chimes M.D.   On: 09/08/2018 17:24   Dg Chest Port 1 View  Result Date: 09/08/2018 CLINICAL DATA:  Weakness for 3 days. EXAM: PORTABLE CHEST 1 VIEW COMPARISON:  August 22, 2018 FINDINGS: Dual lumen injectable port in stable position. Mildly enlarged cardiac silhouette. Bibasilar atelectasis versus peribronchial airspace consolidation. Osseous structures are without acute abnormality. Soft tissues are grossly normal. IMPRESSION: Bibasilar atelectasis versus peribronchial airspace consolidation. Electronically Signed   By: Fidela Salisbury M.D.   On: 09/08/2018 15:15    ROS: all other systems reviewed and are negative except per HPI Blood pressure 107/69, pulse 93, temperature (!) 97.4 F (36.3 C), temperature source Oral, resp. rate 20, SpO2 98 %. .  GEN sitting up on HD, eating a sandwich HEENT EOMI PERRL NECK + JVD PULM a little breathless, poor air movement bilaterally CV RRR ABD obese EXT 1+ LE edema NEURO able to follow conversation, some memory difficulties, + asterixis SKIN no rashes or lesions ACCESS: R TDC  Assessment/Plan: 1  Weakness/ lethargy: likely d/t SDH, deconditioning, underdialysis given asterixis.  Blood cultures collected- lower suspicion for infection but will defer to primary on whether or not to add antibiotics.  She ultimately needs a SNF which I recommended to her and her son today (hopefully can get qualifying stay).  Current living situation seems difficult for all given logistics of getting her out of apt down stairs to exit building. Cycling troponins as well 2 ESRD: TTS [place].  Will do HD tonight on sched and then again Sat.  Reports of shortened rx- she has some asterixis so hopeful mental status will improve somewhat after HD.   3 Hypertension: gets midodrine before HD and then in if needed- have ordered 4. Anemia of ESRD: no ESA needed now 5. Metabolic Bone Disease: gets calcitriol/ sensipar in HD, renvela as binder 6.  Recent SDH- no heparin in HD, CT head unchanged 7.  COPD: on 5L home O2- hopefully will get to baseline O2 req after HD 8.  Dispo: admitted, hopefully pt will agree to SNF  Joan Mosley 09/08/2018, 8:14 PM

## 2018-09-08 NOTE — H&P (Addendum)
[facility]                                                                                    Patient Demographics  Joan Mosley, is a 73 y.o. female  CSN: [id number]  MRN: [id number]  DOB - [birth year]  Admit Date - 09/08/2018  Outpatient Primary MD for the patient is Nche, Charlene Brooke, NP   With History of -  Past Medical History:  Diagnosis Date  . Arthritis   . COPD (chronic obstructive pulmonary disease) ([place])   . Diabetes mellitus without complication ([place])   . Diabetic neuropathy ([place])   . 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 Renal disorder    ESRD  . Thyroid disease   . TIA (transient ischemic attack)       Past Surgical History:  Procedure Laterality Date  . ABDOMINAL HYSTERECTOMY    . RECTOCELE REPAIR    . REPLACEMENT TOTAL KNEE BILATERAL Bilateral 2008  . THYROIDECTOMY     80%  . VAGINAL WOUND CLOSURE / REPAIR      in for   Chief Complaint  Patient presents with  . Weakness     HPI  Joan Mosley  is a 73 y.o. female, with past medical history significant for end-stage renal disease, on hemodialysis, morbid obesity, diabetes mellitus type 2 with a recent admission for subdural hematomas discharged home yesterday under the care of her children.  When I saw the patient earlier she was confused and could not give any significant history but she started improving over time.  Most of the history was taken from the triage nurse who reported that she has been feeling weak and lethargic for the last few days.  She missed her dialysis on Tuesday.  When EMS saw the patient she was hypoxemic at 89% and she had to be placed on 10 L high flow nasal cannula.    Review of Systems    Unable to obtain due to patient's condition   Social History Social History   Tobacco Use  . Smoking status: Former Smoker    Packs/day: 1.00    Years: 35.00    Pack years: 35.00    Quit date: 03/29/1981    Years since quitting: 37.4  . Smokeless  tobacco: Never Used  Substance Use Topics  . Alcohol use: Never    Frequency: Never     Family History Family History  Problem Relation Age of Onset  . Heart disease Mother   . Hypertension Mother   . Heart disease Father   . Hypertension Father      Prior to Admission medications   Medication Sig Start Date End Date Taking? Authorizing Provider  acetaminophen (TYLENOL) 500 MG tablet Take 1,000 mg by mouth every 6 (six) hours as needed for mild pain.   Yes [provider]  albuterol (VENTOLIN HFA) 108 (90 Base) MCG/ACT inhaler Inhale 2 puffs into the lungs every 6 (six) hours as needed for wheezing or shortness of breath.   Yes [provider]  calcitRIOL (ROCALTROL) 0.25 MCG capsule Take 0.25 mcg by mouth daily.   Yes [provider]  clonazePAM (KLONOPIN) 1 MG tablet Take 1 tablet (1 mg total) by mouth 2 (two) times daily as needed for anxiety. Change in dose 08/08/18  Yes Nche, Charlene Brooke, NP  FLUoxetine (PROZAC) 10 MG tablet Take 1 tablet (10 mg total) by mouth daily. 08/08/18  Yes Nche, Charlene Brooke, NP  furosemide (LASIX) 80 MG tablet Take 80 mg by mouth See admin instructions. Sunday, Monday,Wed,Friday--in AM    Yes [provider]  insulin detemir (LEVEMIR) 100 UNIT/ML injection Inject 0.15 mLs (15 Units total) into the skin daily. 07/04/18  Yes Nche, Charlene Brooke, NP  insulin lispro (HUMALOG) 100 UNIT/ML injection Inject 1-5 Units into the skin 3 (three) times daily with meals as needed for high blood sugar. Sliding scale    Yes [provider]  ipratropium (ATROVENT) 0.02 % nebulizer solution Inhale 0.5 mg into the lungs 4 (four) times daily.    Yes [provider]  levothyroxine (SYNTHROID) 112 MCG tablet Take 1 tablet (112 mcg total) by mouth daily before breakfast. 1 AM Patient taking differently: Take 112 mcg by mouth daily before breakfast.  07/04/18  Yes Nche, Charlene Brooke, NP  lidocaine (LIDODERM) 5 % Place 1 patch  onto the skin every 12 (twelve) hours as needed (back pain). Remove & Discard patch within 12 hours or as directed by MD    Yes [provider]  midodrine (PROAMATINE) 10 MG tablet Take 10 mg by mouth 3 (three) times daily. Taking on Dialysis days, Tues, Thur, Sat.   Yes [provider]  montelukast (SINGULAIR) 10 MG tablet Take 10 mg by mouth at bedtime. PM   Yes [provider]  nystatin (MYCOSTATIN/NYSTOP) powder Apply 1 g topically 2 (two) times daily as needed (skin rash).    Yes [provider]  nystatin cream (MYCOSTATIN) Apply 1 application topically 2 (two) times daily as needed for dry skin.    Yes [provider]  rosuvastatin (CRESTOR) 40 MG tablet Take 40 mg by mouth daily. AM   Yes [provider]  senna-docusate (SENNA PLUS) 8.6-50 MG tablet Take 1 tablet by mouth at bedtime. 07/04/18  Yes Nche, Charlene Brooke, NP  sevelamer (RENAGEL) 800 MG tablet Take 1,600 mg by mouth 3 (three) times daily with meals. 1600 mg --3 times daily--take additional 1-2 if eats snack   Yes [provider]    Allergies  Allergen Reactions  . Avelox [Moxifloxacin Hcl In Nacl]     PT does not remember  . Codeine Anaphylaxis    PT does not remember  . Other     PT does not remember  . Penicillins     Rash  . Sulfa Antibiotics     PT does nolt remember  . Shellfish Allergy Hives, Itching and Rash    Physical Exam  Vitals  Blood pressure 131/88, pulse 88, temperature 97.9 F (36.6 C), resp. rate (!) 27, SpO2 93 %.  General appearance well-developed, obese female, confused HEENT no jaundice or pallor, or facial deviation, pupils equal reactive to light Neck supple, no neck vein distention Chest scattered rhonchi Heart normal S1-S2 no gallops rubs Abdomen obese, bowel sounds present Extremities no clubbing or cyanosis, bilateral lower extremity edema with stasis dermatitis  Data Review  CBC Recent Labs  Lab 09/06/18 0744  09/07/18 0353 09/08/18 1530 09/08/18 1541  WBC 11.8* 10.1 11.7*  --   HGB 9.4* 9.2* 8.5* 11.6*  HCT 30.9* 30.0* 28.3* 34.0*  PLT 171 161 151  --  MCV 96.6 97.1 99.6  --   MCH 29.4 29.8 29.9  --   MCHC 30.4 30.7 30.0  --   RDW 15.5 15.7* 15.4  --   LYMPHSABS 1.4  --   --   --   MONOABS 0.7  --   --   --   EOSABS 0.3  --   --   --   BASOSABS 0.0  --   --   --    ------------------------------------------------------------------------------------------------------------------  Chemistries  Recent Labs  Lab 09/06/18 0744 09/07/18 0353 09/08/18 1530 09/08/18 1541  NA 135 135 137 134*  K 5.4* 4.8 5.2* 5.1  CL 92* 95* 93*  --   CO2 24 27 28   --   GLUCOSE 237* 118* 83  --   BUN 74* 32* 51*  --   CREATININE 13.46* 7.69* 10.49*  --   CALCIUM 8.7* 9.0 8.8*  --   AST  --   --  10*  --   ALT  --   --  11  --   ALKPHOS  --   --  89  --   BILITOT  --   --  0.7  --    ------------------------------------------------------------------------------------------------------------------ estimated creatinine clearance is 5.4 mL/min (A) (by C-G formula based on SCr of 10.49 mg/dL (H)). ------------------------------------------------------------------------------------------------------------------ No results for input(s): TSH, T4TOTAL, T3FREE, THYROIDAB in the last 72 hours.  Invalid input(s): FREET3   Coagulation profile Recent Labs  Lab 09/08/18 1530  INR 1.1   ------------------------------------------------------------------------------------------------------------------- No results for input(s): DDIMER in the last 72 hours. -------------------------------------------------------------------------------------------------------------------  Cardiac Enzymes No results for input(s): CKMB, TROPONINI, MYOGLOBIN in the last 168 hours.  Invalid input(s): CK ------------------------------------------------------------------------------------------------------------------ Invalid  input(s): POCBNP   ---------------------------------------------------------------------------------------------------------------  Urinalysis    Component Value Date/Time   COLORURINE YELLOW 04/20/2018 0301   APPEARANCEUR HAZY (A) 04/20/2018 0301   LABSPEC 1.009 04/20/2018 0301   PHURINE 9.0 (H) 04/20/2018 0301   GLUCOSEU 150 (A) 04/20/2018 0301   HGBUR MODERATE (A) 04/20/2018 0301   BILIRUBINUR NEGATIVE 04/20/2018 0301   KETONESUR NEGATIVE 04/20/2018 0301   PROTEINUR 100 (A) 04/20/2018 0301   NITRITE NEGATIVE 04/20/2018 0301   LEUKOCYTESUR NEGATIVE 04/20/2018 0301    ----------------------------------------------------------------------------------------------------------------   Imaging results:   Dg Chest 2 View  Result Date: 08/22/2018 CLINICAL DATA:  Weakness, altered level of consciousness EXAM: CHEST - 2 VIEW COMPARISON:  08/02/2018 FINDINGS: Lungs are clear.  No pleural effusion or pneumothorax. Cardiomegaly. Right IJ dual lumen dialysis catheter terminates in the upper right atrium. Mild degenerative changes of the visualized thoracolumbar spine. IMPRESSION: Normal chest radiographs. Electronically Signed   By: Julian Hy M.D.   On: 08/22/2018 19:52   Ct Head Wo Contrast  Result Date: 09/08/2018 CLINICAL DATA:  Unexplained altered level of consciousness. Week. Lethargic. Dialysis patient. EXAM: CT HEAD WITHOUT CONTRAST TECHNIQUE: Contiguous axial images were obtained from the base of the skull through the vertex without intravenous contrast. COMPARISON:  09/06/2018 FINDINGS: Brain: No change over the last 2 days. Bilateral mixed density subdural hematomas, approximately 1 cm thickness on each side. No evidence of enlargement or additional hyperdense bleeding. Because there is brain atrophy, there is only minimal mass effect. Left-to-right midline shift of 2 mm. No intraparenchymal hemorrhage. No acute parenchymal infarction. Chronic small-vessel ischemic changes  affecting the pons, thalami and hemispheric white matter. Vascular: There is atherosclerotic calcification of the major vessels at the base of the brain. Skull: Negative Sinuses/Orbits: Chronic right maxillary sinusitis. Mucosal inflammation of the frontal  and ethmoid regions on the right as well. Other: None IMPRESSION: No significant change over the last 2 days. Mixed density bilateral convexity subdural hematomas, proximal thickness 1 cm on each side. No additional hyperdense bleeding. Minimal mass effect with left-to-right shift of 2 mm. Electronically Signed   By: Nelson Chimes M.D.   On: 09/08/2018 17:24   Ct Head Wo Contrast  Result Date: 09/06/2018 CLINICAL DATA:  Altered mental status and fatigue. Recent subdural hematomas EXAM: CT HEAD WITHOUT CONTRAST TECHNIQUE: Contiguous axial images were obtained from the base of the skull through the vertex without intravenous contrast. COMPARISON:  August 25, 2018 FINDINGS: Brain: There is age related volume loss. There remains a subdural hematoma in the right frontal region with both acute and more chronic appearing fluid. There remains a degree of mass effect on the right frontal lobe, slightly less than on the previous study. The current maximum thickness of the subdural hematoma on the right measures 1.4 cm compared to a maximum thickness of 1.6 cm. There is a subdural hematoma on the left in the frontal and parietal regions which also has mixed attenuation consistent with both relatively acute and more subacute to chronic hemorrhage. The maximum thickness of this subdural hematoma measures 1.3 cm compared to 1.6 cm on most recent study. No new hemorrhage or new extra-axial fluid collections are evident. There is no intra-axial mass or hemorrhage. Currently there is 2 mm of midline shift to the right compared to 5 mm of midline shift to the right on most recent study. There is small vessel disease in the centra semiovale bilaterally. No acute infarct is  appreciable. Vascular: No hyperdense vessels are evident. There is calcification in each carotid siphon region and distal left vertebral artery. Skull: The bony calvarium appears intact. Sinuses/Orbits: There is opacification throughout the right maxillary antrum. There are more scattered areas of opacification in the left maxillary antrum with probable polyp superiorly. There is mucosal thickening in several ethmoid air cells as well as in the right frontal sinus region. Orbits appear symmetric bilaterally. Other: There is opacification of several inferior mastoid air cells bilaterally, stable. IMPRESSION: 1. There remain bilateral subdural hematomas with both more acute and more chronic appearing fluid on each side. The right subdural hematoma is seen in the right frontal region with mild mass effect on the right frontal lobe. This subdural hematoma at its maximum measures 1.4 cm compared to a thickness of 1.6 cm on the previous study. On the left, the subdural hematoma is slightly larger and involves frontal and parietal regions. The mixed attenuation in the fluid remains stable. The maximum thickness on the left currently measures 1.3 cm compared to 1.6 cm on the previous study. There appears to be slightly less mass effect from the subdural hematomas compared to recent prior study. There is 2 mm of midline shift to the right compared to 5 mm of midline shift to the right on the previous study. 2. No intra-axial hemorrhage. There is patchy periventricular small vessel disease. No acute infarct evident. 3.  Foci of arterial vascular calcification noted. 4. Multifocal paranasal sinus disease again noted. Mild inferior mastoid disease bilaterally noted. Electronically Signed   By: Lowella Grip III M.D.   On: 09/06/2018 08:43   Ct Head Wo Contrast  Result Date: 08/25/2018 CLINICAL DATA:  Follow-up examination for subdural hemorrhage. EXAM: CT HEAD WITHOUT CONTRAST TECHNIQUE: Contiguous axial images were  obtained from the base of the skull through the vertex without intravenous contrast. COMPARISON:  Prior CT from 08/22/2018 FINDINGS: Brain: Bilateral extra-axial mixed density subdural hematomas again seen, acute-subacute on chronic in appearance. Left-sided collection measures up to 16 mm in maximal thickness at the left frontal convexity. Right collection measures up to 16 mm as well. Overall, appearance is not significantly changed from previous. Secondary mild mass effect on the subjacent cerebral hemispheres with persistent 5 mm left-to-right shift, little interval changed. No hydrocephalus or ventricular trapping. Basilar cisterns remain patent. No new intracranial hemorrhage. No acute large vessel territory infarct. Atrophy with chronic microvascular ischemic disease noted. Vascular: Calcified atherosclerosis noted at the skull base. No hyperdense vessel. Skull: Scalp soft tissues and calvarium within normal limits. Sinuses/Orbits: Globes and orbital soft tissues are normal. Chronic right-sided paranasal sinusitis noted. Trace bilateral mastoid effusions noted. Other: None. IMPRESSION: 1. No significant interval change in size and appearance of bilateral mixed density subdural hematomas, measuring up to 16 mm in maximal diameter on current exam. Associated 5 mm left-to-right midline shift also not significantly changed. 2. No other new acute intracranial abnormality. 3. Atrophy with chronic microvascular ischemic disease. 4. Chronic right-sided paranasal sinusitis. Electronically Signed   By: Jeannine Boga M.D.   On: 08/25/2018 04:06   Ct Head Wo Contrast  Result Date: 08/22/2018 CLINICAL DATA:  Altered level of consciousness. EXAM: CT HEAD WITHOUT CONTRAST TECHNIQUE: Contiguous axial images were obtained from the base of the skull through the vertex without intravenous contrast. COMPARISON:  None. FINDINGS: Brain: Bilateral extra-axial fluid collections compatible with subdural hematomas that  appears subacute to chronic. Majority of the fluid is low-density. There are small areas of higher density. Multiple septations are seen within the fluid. Right-sided collection measures approximately 14 mm and left sided collection measures 15 mm. Minimal midline shift to the right. Negative for hydrocephalus. No acute infarct. Chronic ischemic change in the white matter. Vascular: Negative for hyperdense vessel Skull: Negative Sinuses/Orbits: Mucosal edema paranasal sinuses most notably right frontal ethmoid and maxillary sinuses. Bony thickening of the maxillary sinus bilaterally Other: None IMPRESSION: 15 mm bilateral subdural hematomas which appears subacute to chronic. Majority of the fluid is low-density without evidence of acute bleeding. Minimal midline shift to the right. These results were called by telephone at the time of interpretation on 08/22/2018 at 9:18 pm to Dr. Isla Pence , who verbally acknowledged these results. Electronically Signed   By: Franchot Gallo M.D.   On: 08/22/2018 21:19   Dg Chest Port 1 View  Result Date: 09/08/2018 CLINICAL DATA:  Weakness for 3 days. EXAM: PORTABLE CHEST 1 VIEW COMPARISON:  August 22, 2018 FINDINGS: Dual lumen injectable port in stable position. Mildly enlarged cardiac silhouette. Bibasilar atelectasis versus peribronchial airspace consolidation. Osseous structures are without acute abnormality. Soft tissues are grossly normal. IMPRESSION: Bibasilar atelectasis versus peribronchial airspace consolidation. Electronically Signed   By: Fidela Salisbury M.D.   On: 09/08/2018 15:15    My personal review of EKG: Rhythm NSR, Rate 95 bpm with anterior T wave inversions, and no change from before  Assessment & Plan  Acute respiratory failure, on high flow oxygen at this time Chest x-ray shows bibasilar atelectasis versus consolidation?  HCAP We will start IV antibiotics Nebulizer treatment  ESRD on hemodialysis TTS Missed her dialysis last 2  times Nephrology consulted for hemodialysis  Hypotension Continue with midodrine  Diabetes mellitus Lantus/ISS    DVT Prophylaxis Heparin  AM Labs Ordered, also please review Full Orders    Code Status full  Disposition Plan: Home  Time spent in minutes :  41 minutes  Condition GUARDED   @SIGNATURE @

## 2018-09-08 NOTE — Procedures (Signed)
Patient seen and examined on Hemodialysis. BP (!) 167/88   Pulse 92   Temp (!) 97.4 F (36.3 C) (Oral)   Resp 20   SpO2 92%   QB 400 mL/ min via TDC, UF goal 3L net.  Sitting up, eating sandwich, son at bedside.   Tolerating treatment without complaints at this time.  Had just received midodrine.  Discussed with pt and son that eating on machine could bring down BP- agreed to save rest of sandwich for later.     Madelon Lips MD [facility] pgr [phone] 9:01 PM

## 2018-09-09 ENCOUNTER — Inpatient Hospital Stay (HOSPITAL_COMMUNITY): Payer: Medicare Other

## 2018-09-09 ENCOUNTER — Encounter (HOSPITAL_COMMUNITY): Payer: Self-pay | Admitting: *Deleted

## 2018-09-09 LAB — CBC
HCT: 31.3 % — ABNORMAL LOW (ref 36.0–46.0)
Hemoglobin: 9.6 g/dL — ABNORMAL LOW (ref 12.0–15.0)
MCH: 29.9 pg (ref 26.0–34.0)
MCHC: 30.7 g/dL (ref 30.0–36.0)
MCV: 97.5 fL (ref 80.0–100.0)
Platelets: 155 10*3/uL (ref 150–400)
RBC: 3.21 MIL/uL — ABNORMAL LOW (ref 3.87–5.11)
RDW: 15.3 % (ref 11.5–15.5)
WBC: 10.3 10*3/uL (ref 4.0–10.5)
nRBC: 0 % (ref 0.0–0.2)

## 2018-09-09 LAB — GLUCOSE, CAPILLARY
Glucose-Capillary: 78 mg/dL (ref 70–99)
Glucose-Capillary: 82 mg/dL (ref 70–99)
Glucose-Capillary: 149 mg/dL — ABNORMAL HIGH (ref 70–99)
Glucose-Capillary: 162 mg/dL — ABNORMAL HIGH (ref 70–99)

## 2018-09-09 LAB — BRAIN NATRIURETIC PEPTIDE: B Natriuretic Peptide: 312.3 pg/mL — ABNORMAL HIGH (ref 0.0–100.0)

## 2018-09-09 LAB — BASIC METABOLIC PANEL
Anion gap: 12 (ref 5–15)
BUN: 13 mg/dL (ref 8–23)
CO2: 26 mmol/L (ref 22–32)
Calcium: 8.8 mg/dL — ABNORMAL LOW (ref 8.9–10.3)
Chloride: 99 mmol/L (ref 98–111)
Creatinine, Ser: 4.15 mg/dL — ABNORMAL HIGH (ref 0.44–1.00)
GFR calc Af Amer: 11 mL/min — ABNORMAL LOW (ref >60)
GFR calc non Af Amer: 10 mL/min — ABNORMAL LOW (ref >60)
Glucose, Bld: 106 mg/dL — ABNORMAL HIGH (ref 70–99)
Potassium: 3.7 mmol/L (ref 3.5–5.1)
Sodium: 137 mmol/L (ref 135–145)

## 2018-09-09 LAB — TROPONIN I (HIGH SENSITIVITY)
Troponin I (High Sensitivity): 22 ng/L — ABNORMAL HIGH (ref <18)
Troponin I (High Sensitivity): 22 ng/L — ABNORMAL HIGH (ref <18)

## 2018-09-09 LAB — MRSA PCR SCREENING: MRSA by PCR: POSITIVE — AB

## 2018-09-09 MED ORDER — SODIUM CHLORIDE 0.9 % IV SOLN
500.0000 mg | INTRAVENOUS | Status: AC
  Administered 2018-09-09 – 2018-09-13 (×4): 500 mg via INTRAVENOUS
  Filled 2018-09-09 (×5): qty 500

## 2018-09-09 MED ORDER — METHYLPREDNISOLONE SODIUM SUCC 40 MG IJ SOLR
40.0000 mg | Freq: Three times a day (TID) | INTRAMUSCULAR | Status: DC
  Administered 2018-09-09 – 2018-09-15 (×18): 40 mg via INTRAVENOUS
  Filled 2018-09-09 (×18): qty 1

## 2018-09-09 MED ORDER — INSULIN ASPART 100 UNIT/ML ~~LOC~~ SOLN
0.0000 [IU] | Freq: Three times a day (TID) | SUBCUTANEOUS | Status: DC
  Administered 2018-09-09: 1 [IU] via SUBCUTANEOUS
  Administered 2018-09-10 – 2018-09-11 (×2): 2 [IU] via SUBCUTANEOUS
  Administered 2018-09-11: 5 [IU] via SUBCUTANEOUS
  Administered 2018-09-11 – 2018-09-12 (×2): 3 [IU] via SUBCUTANEOUS
  Administered 2018-09-12: 5 [IU] via SUBCUTANEOUS
  Administered 2018-09-12: 2 [IU] via SUBCUTANEOUS
  Administered 2018-09-13: 1 [IU] via SUBCUTANEOUS
  Administered 2018-09-13: 5 [IU] via SUBCUTANEOUS
  Administered 2018-09-14: 3 [IU] via SUBCUTANEOUS
  Administered 2018-09-14 (×2): 2 [IU] via SUBCUTANEOUS
  Administered 2018-09-15: 5 [IU] via SUBCUTANEOUS
  Administered 2018-09-15: 2 [IU] via SUBCUTANEOUS
  Administered 2018-09-16: 1 [IU] via SUBCUTANEOUS

## 2018-09-09 MED ORDER — MUPIROCIN 2 % EX OINT
1.0000 "application " | TOPICAL_OINTMENT | Freq: Two times a day (BID) | CUTANEOUS | Status: AC
  Administered 2018-09-11 – 2018-09-13 (×6): 1 via NASAL
  Filled 2018-09-09 (×3): qty 22

## 2018-09-09 MED ORDER — MIDODRINE HCL 5 MG PO TABS
10.0000 mg | ORAL_TABLET | ORAL | Status: DC
  Administered 2018-09-10 – 2018-09-15 (×7): 10 mg via ORAL
  Filled 2018-09-09 (×6): qty 2

## 2018-09-09 MED ORDER — FUROSEMIDE 80 MG PO TABS
80.0000 mg | ORAL_TABLET | ORAL | Status: DC
  Administered 2018-09-11 – 2018-09-12 (×2): 80 mg via ORAL
  Filled 2018-09-09 (×2): qty 1

## 2018-09-09 MED ORDER — ORAL CARE MOUTH RINSE: 15.0000 mL | Freq: Two times a day (BID) | OROMUCOSAL | Status: DC

## 2018-09-09 MED ORDER — PRO-STAT SUGAR FREE PO LIQD
30.0000 mL | Freq: Two times a day (BID) | ORAL | Status: DC
  Administered 2018-09-10 – 2018-09-16 (×5): 30 mL via ORAL
  Filled 2018-09-09 (×12): qty 30

## 2018-09-09 MED ORDER — ORAL CARE MOUTH RINSE
15.0000 mL | Freq: Two times a day (BID) | OROMUCOSAL | Status: DC
  Administered 2018-09-10 – 2018-09-16 (×7): 15 mL via OROMUCOSAL

## 2018-09-09 NOTE — Progress Notes (Signed)
CRITICAL VALUE ALERT  Critical Value:  MRSA positive screening in the nares  Date & Time Notied:  09/09/2018 0441  Provider Notified:   Orders Received/Actions taken: orders were made per protocol

## 2018-09-09 NOTE — Progress Notes (Signed)
PROGRESS NOTE    Joan Mosley  [id number] DOB: Nov 30, 1945 DOA: 09/08/2018 PCP: Flossie Buffy, NP     Brief Narrative:  Joan Mosley is a 73 year old female with past medical history significant for ESRD on dialysis Tuesday, Thursday, Saturday, morbid obesity, type 2 diabetes with recent admission for subdural hematoma and discharged home on 8/12 after refusing SNF.  She returns to the hospital with chief complaint of weakness, lethargy, as well as worsening acute on chronic hypoxemic respiratory failure.  New events last 24 hours / Subjective: Rapid response called this morning, multiple desaturation events.  She was de-satting into the 70s on 6 L nasal cannula, increased to nonrebreather to maintain saturation.  Assessment & Plan:   Active Problems:   Pulmonary edema   Respiratory failure (HCC)   Acute on chronic hypoxemic respiratory failure -Patient on 5 L nasal cannula O2 at baseline -COVID-19 negative -Chest x-ray 8/13 with bibasilar atelectasis versus peribronchial airspace consolidation.  Personally reviewed, no significant change from x-ray in July 2020 -Placed on BiPAP this morning, wean as able back to her baseline level  COPD exacerbation -Breathing treatments.  Add Solu-Medrol today as well as azithromycin for atypical coverage  ESRD on hemodialysis TThSa -Appreciate nephrology  Hypotension -Continue midodrine during dialysis  Diabetes mellitus type 2 -Continue Levemir, sliding scale insulin  Hypothyroidism -Continue Synthroid  Hyperlipidemia -Continue Crestor  Subdural hematoma -Repeat CT head 8/13 without significant change, mixed density bilateral convexity subdural hematoma, proximal thickness 1 cm on each side.  No additional hyperdense bleeding.  Minimal mass-effect   Demand ischemia -Elevated troponin in setting of ESRD, troponin has been flat 22--> 22  Deconditioning -SNF placement was recommended previous admission, which patient  had refused -Agreeable to SNF placement this time.  PT OT and social worker consulted  Depression, anxiety -Continue Prozac, Klonopin          DVT prophylaxis: SCD Code Status: Full code Family Communication: None at bedside, planto discuss with family member later today Disposition Plan: Pending further improvement, patient agreeable to SNF placement   Consultants:   Nephrology  Procedures:   None  Antimicrobials:  Anti-infectives (From admission, onward)   None        Objective: Vitals:   09/09/18 0744 09/09/18 0753 09/09/18 0820 09/09/18 0822  BP:      Pulse:  97    Resp:  18    Temp:      TempSrc:      SpO2: 96% 96% (!) 78% 91%  Weight:      Height:        Intake/Output Summary (Last 24 hours) at 09/09/2018 1118 Last data filed at 09/08/2018 2330 Gross per 24 hour  Intake -  Output 3000 ml  Net -3000 ml   Filed Weights   09/09/18 0009  Weight: 106.1 kg    Examination:  General exam: Appears calm  Respiratory system: Diminished breath sounds bilaterally, on nonrebreather, no accessory muscle use Cardiovascular system: S1 & S2 heard, RRR. No murmurs. No pedal edema. Gastrointestinal system: Abdomen is nondistended, soft and nontender. Normal bowel sounds heard. Central nervous system: Alert and oriented. No focal neurological deficits. Speech clear.  Extremities: Symmetric in appearance  Skin: No rashes, lesions or ulcers on exposed skin  Psychiatry: Judgement and insight appear stable  Data Reviewed: I have personally reviewed following labs and imaging studies  CBC: Recent Labs  Lab 09/06/18 0744 09/07/18 0353 09/08/18 1530 09/08/18 1541 09/09/18 0231  WBC 11.8* 10.1 11.7*  --  10.3  NEUTROABS 9.4*  --   --   --   --   HGB 9.4* 9.2* 8.5* 11.6* 9.6*  HCT 30.9* 30.0* 28.3* 34.0* 31.3*  MCV 96.6 97.1 99.6  --  97.5  PLT 171 161 151  --  [id number]   Basic Metabolic Panel: Recent Labs  Lab 09/06/18 0744 09/07/18 0353 09/08/18 1530  09/08/18 1541 09/09/18 0231  NA 135 135 137 134* 137  K 5.4* 4.8 5.2* 5.1 3.7  CL 92* 95* 93*  --  99  CO2 24 27 28   --  26  GLUCOSE 237* 118* 83  --  106*  BUN 74* 32* 51*  --  13  CREATININE 13.46* 7.69* 10.49*  --  4.15*  CALCIUM 8.7* 9.0 8.8*  --  8.8*   GFR: Estimated Creatinine Clearance: 13.1 mL/min (A) (by C-G formula based on SCr of 4.15 mg/dL (H)). Liver Function Tests: Recent Labs  Lab 09/08/18 1530  AST 10*  ALT 11  ALKPHOS 89  BILITOT 0.7  PROT 6.7  ALBUMIN 3.1*   No results for input(s): LIPASE, AMYLASE in the last 168 hours. No results for input(s): AMMONIA in the last 168 hours. Coagulation Profile: Recent Labs  Lab 09/08/18 1530  INR 1.1   Cardiac Enzymes: No results for input(s): CKTOTAL, CKMB, CKMBINDEX, TROPONINI in the last 168 hours. BNP (last 3 results) No results for input(s): PROBNP in the last 8760 hours. HbA1C: No results for input(s): HGBA1C in the last 72 hours. CBG: Recent Labs  Lab 09/06/18 0754 09/06/18 1152 09/06/18 1832 09/06/18 2118 09/07/18 0605  GLUCAP 176* 128* 83 142* 122*   Lipid Profile: No results for input(s): CHOL, HDL, LDLCALC, TRIG, CHOLHDL, LDLDIRECT in the last 72 hours. Thyroid Function Tests: No results for input(s): TSH, T4TOTAL, FREET4, T3FREE, THYROIDAB in the last 72 hours. Anemia Panel: No results for input(s): VITAMINB12, FOLATE, FERRITIN, TIBC, IRON, RETICCTPCT in the last 72 hours. Sepsis Labs: Recent Labs  Lab 09/08/18 1530  LATICACIDVEN 0.8    Recent Results (from the past 240 hour(s))  SARS CORONAVIRUS 2 Nasal Swab Aptima Multi Swab     Status: None   Collection Time: 09/06/18  9:16 AM   Specimen: Aptima Multi Swab; Nasal Swab  Result Value Ref Range Status   SARS Coronavirus 2 NEGATIVE NEGATIVE Final    Comment: (NOTE) SARS-CoV-2 target nucleic acids are NOT DETECTED. The SARS-CoV-2 RNA is generally detectable in upper and lower respiratory specimens during the acute phase of  infection. Negative results do not preclude SARS-CoV-2 infection, do not rule out co-infections with other pathogens, and should not be used as the sole basis for treatment or other patient management decisions. Negative results must be combined with clinical observations, patient history, and epidemiological information. The expected result is Negative. Fact Sheet for Patients: SugarRoll.be Fact Sheet for Healthcare Providers: https://www.woods-mathews.com/ This test is not yet approved or cleared by the [place] FDA and  has been authorized for detection and/or diagnosis of SARS-CoV-2 by FDA under an Emergency Use Authorization (EUA). This EUA will remain  in effect (meaning this test can be used) for the duration of the COVID-19 declaration under Section 56 4(b)(1) of the Act, 21 U.S.C. section 360bbb-3(b)(1), unless the authorization is terminated or revoked sooner. Performed at [facility], [address], [place], [place] [address]   Culture, blood (Routine X 2) w Reflex to ID Panel     Status: None (Preliminary result)   Collection Time: 09/08/18  2:40 PM   Specimen: BLOOD  Result Value Ref Range Status   Specimen Description BLOOD BLOOD RIGHT FOREARM  Final   Special Requests   Final    BOTTLES DRAWN AEROBIC ONLY Blood Culture results may not be optimal due to an inadequate volume of blood received in culture bottles   Culture   Final    NO GROWTH < 24 HOURS Performed at [facility], [place] [address], [place], [place] [address]    Report Status PENDING  Incomplete  Culture, blood (Routine X 2) w Reflex to ID Panel     Status: None (Preliminary result)   Collection Time: 09/08/18  3:14 PM   Specimen: BLOOD  Result Value Ref Range Status   Specimen Description BLOOD SITE NOT SPECIFIED  Final   Special Requests   Final    BOTTLES DRAWN AEROBIC AND ANAEROBIC Blood Culture results may not be optimal due to an  inadequate volume of blood received in culture bottles   Culture   Final    NO GROWTH < 24 HOURS Performed at [facility], [place] [address] [place], [place] [address]    Report Status PENDING  Incomplete  SARS Coronavirus [address] [facility] order, Performed in [facility] hospital lab) Nasopharyngeal Nasopharyngeal Swab     Status: None   Collection Time: 09/08/18  4:10 PM   Specimen: Nasopharyngeal Swab  Result Value Ref Range Status   SARS Coronavirus 2 NEGATIVE NEGATIVE Final    Comment: (NOTE) If result is NEGATIVE SARS-CoV-2 target nucleic acids are NOT DETECTED. The SARS-CoV-2 RNA is generally detectable in upper and lower  respiratory specimens during the acute phase of infection. The lowest  concentration of SARS-CoV-2 viral copies this assay can detect is 250  copies / mL. A negative result does not preclude SARS-CoV-2 infection  and should not be used as the sole basis for treatment or other  patient management decisions.  A negative result may occur with  improper specimen collection / handling, submission of specimen other  than nasopharyngeal swab, presence of viral mutation(s) within the  areas targeted by this assay, and inadequate number of viral copies  (<250 copies / mL). A negative result must be combined with clinical  observations, patient history, and epidemiological information. If result is POSITIVE SARS-CoV-2 target nucleic acids are DETECTED. The SARS-CoV-2 RNA is generally detectable in upper and lower  respiratory specimens dur ing the acute phase of infection.  Positive  results are indicative of active infection with SARS-CoV-2.  Clinical  correlation with patient history and other diagnostic information is  necessary to determine patient infection status.  Positive results do  not rule out bacterial infection or co-infection with other viruses. If result is PRESUMPTIVE POSTIVE SARS-CoV-2 nucleic acids MAY BE PRESENT.   A presumptive positive result  was obtained on the submitted specimen  and confirmed on repeat testing.  While 2019 novel coronavirus  (SARS-CoV-2) nucleic acids may be present in the submitted sample  additional confirmatory testing may be necessary for epidemiological  and / or clinical management purposes  to differentiate between  SARS-CoV-2 and other Sarbecovirus currently known to infect humans.  If clinically indicated additional testing with an alternate test  methodology [phone]) is advised. The SARS-CoV-2 RNA is generally  detectable in upper and lower respiratory sp ecimens during the acute  phase of infection. The expected result is Negative. Fact Sheet for Patients:  StrictlyIdeas.no Fact Sheet for Healthcare Providers: BankingDealers.co.za This test is not yet  approved or cleared by the [place] and has been authorized for detection and/or diagnosis of SARS-CoV-2 by FDA under an Emergency Use Authorization (EUA).  This EUA will remain in effect (meaning this test can be used) for the duration of the COVID-19 declaration under Section 564(b)(1) of the Act, 21 U.S.C. section 360bbb-3(b)(1), unless the authorization is terminated or revoked sooner. Performed at [facility], [place] [address], [place], [place] [address]   MRSA PCR Screening     Status: Abnormal   Collection Time: 09/09/18 12:44 AM   Specimen: Nasopharyngeal  Result Value Ref Range Status   MRSA by PCR POSITIVE (A) NEGATIVE Final    Comment: CRITICAL RESULT CALLED TO, READ BACK BY AND VERIFIED WITH: RN Livingston Diones [id number] @[id number]  [facility] Performed at [facility], [address], [place], [place] [address]       Radiology Studies: Ct Head Wo Contrast  Result Date: 09/08/2018 CLINICAL DATA:  Unexplained altered level of consciousness. Week. Lethargic. Dialysis patient. EXAM: CT HEAD WITHOUT CONTRAST TECHNIQUE: Contiguous axial images were obtained from the base of the  skull through the vertex without intravenous contrast. COMPARISON:  09/06/2018 FINDINGS: Brain: No change over the last 2 days. Bilateral mixed density subdural hematomas, approximately 1 cm thickness on each side. No evidence of enlargement or additional hyperdense bleeding. Because there is brain atrophy, there is only minimal mass effect. Left-to-right midline shift of 2 mm. No intraparenchymal hemorrhage. No acute parenchymal infarction. Chronic small-vessel ischemic changes affecting the pons, thalami and hemispheric white matter. Vascular: There is atherosclerotic calcification of the major vessels at the base of the brain. Skull: Negative Sinuses/Orbits: Chronic right maxillary sinusitis. Mucosal inflammation of the frontal and ethmoid regions on the right as well. Other: None IMPRESSION: No significant change over the last 2 days. Mixed density bilateral convexity subdural hematomas, proximal thickness 1 cm on each side. No additional hyperdense bleeding. Minimal mass effect with left-to-right shift of 2 mm. Electronically Signed   By: Nelson Chimes M.D.   On: 09/08/2018 17:24   Dg Chest Port 1 View  Result Date: 09/08/2018 CLINICAL DATA:  Weakness for 3 days. EXAM: PORTABLE CHEST 1 VIEW COMPARISON:  August 22, 2018 FINDINGS: Dual lumen injectable port in stable position. Mildly enlarged cardiac silhouette. Bibasilar atelectasis versus peribronchial airspace consolidation. Osseous structures are without acute abnormality. Soft tissues are grossly normal. IMPRESSION: Bibasilar atelectasis versus peribronchial airspace consolidation. Electronically Signed   By: Fidela Salisbury M.D.   On: 09/08/2018 15:15      Scheduled Meds: . albuterol  2.5 mg Nebulization Q6H  . [START ON 09/10/2018] calcitRIOL  1.5 mcg Oral Q T,Th,Sa-HD  . Chlorhexidine Gluconate Cloth  6 each Topical Q0600  . [START ON 09/10/2018] cinacalcet  60 mg Oral Q T,Th,Sa-HD  . feeding supplement (PRO-STAT SUGAR FREE 64)  30 mL Oral  BID  . FLUoxetine  10 mg Oral Daily  . furosemide  80 mg Oral Once per day on Sun Mon Wed Fri  . heparin  5,000 Units Subcutaneous Q12H  . insulin detemir  15 Units Subcutaneous Daily  . ipratropium  0.5 mg Nebulization Q6H  . levothyroxine  112 mcg Oral QAC breakfast  . [START ON 09/10/2018] mouth rinse  15 mL Mouth Rinse BID  . [START ON 09/10/2018] midodrine  10 mg Oral 3 times per day on Tue Thu Sat  . montelukast  10 mg Oral QHS  . mupirocin ointment  1 application Nasal BID  .  rosuvastatin  40 mg Oral Daily  . senna-docusate  1 tablet Oral QHS  . sevelamer carbonate  2,400 mg Oral TID WC  . sodium chloride flush  3 mL Intravenous Q12H   Continuous Infusions: . sodium chloride    . sodium chloride    . sodium chloride       LOS: 1 day      Time spent: 40 minutes   Dessa Phi, DO [facility] [email or web address] 09/09/2018, 11:18 AM

## 2018-09-09 NOTE — Progress Notes (Signed)
MD Gala Lewandowsky notified of low O2 sats this AM. Patient was on 6L sat at 70 and NT came to notified RN promptly. RN then placed patient on NRB 15L and sats return to 96%. Pt is AxO 4. Received orders to continue monitor patient and let MD be aware of any further changes.

## 2018-09-09 NOTE — Progress Notes (Signed)
OT Cancellation Note  Patient Details Name: Joan Mosley MRN: [id number] DOB: [birth year]   Cancelled Treatment:    Reason Eval/Treat Not Completed: Patient not medically ready(Pt on bipap at this time and desatting on non rebreather)  Pt not medically ready for mobility at this time. OT to evaluate at next available treatment time.  [name] [name] OTR/L Acute Rehabilitation Services Pager: [phone] Office: [place] 09/09/2018, 2:43 PM

## 2018-09-09 NOTE — Progress Notes (Signed)
Junie Panning, RN called me to [id number].  Patient had been having sats around 65% on home regimen of 5L.  Patient was placed on ventimask at 10/45, sats still did not improve much, but was in the 70s.  Placed patient on NRB at 15L and woke patient up, patient sats went up to 96% to 98%.  Mindy, RN (Rapid Response) came and looked at patient, asked patient questions to determine LOC, patient was able to answer all questions and seemed alert when awakened.  Patient is currently back on [place] at 6L, sats currently around 89%, with a sat goal of 87 to 88% per MD order.  Patient does not appear in respiratory distress, patient sleeping.  Will continue to monitor.

## 2018-09-09 NOTE — Progress Notes (Addendum)
Received a page from bedside RN that pt sats are in the 60's. On arrival pt sats 92-87% on NRB. Pt is sleeping, but easily arousable. She does not appear to be in any respiratory distress at this time. After several minutes pt placed on 6L [place] and sats continue to remain in the 90's. It is noted in chart that pt has COPD and wear oxygen at home. Will continue to monitor and adjust oxygen as needed.   Chronic respiratory failure associated with COPD -Given albuterol treatment by RT -Continue with [place]. Adjust oxygen levels as needed. -Maintain sats greater than 87%   Lovey Newcomer, NP [facility] [phone]  CRITICAL CARE Performed by: Neila Gear   Total critical care time: 30 minutes  Critical care time was exclusive of separately billable procedures and treating other patients.  Critical care was necessary to treat or prevent imminent or life-threatening deterioration.  Critical care was time spent personally by me on the following activities: development of treatment plan with patient and/or surrogate as well as nursing, discussions with consultants, evaluation of patient's response to treatment, examination of patient, obtaining history from patient or surrogate, ordering and performing treatments and interventions, ordering and review of laboratory studies, ordering and review of radiographic studies, pulse oximetry and re-evaluation of patient's condition.

## 2018-09-09 NOTE — Significant Event (Addendum)
Rapid Response Event Note  Overview: Called d/t SpO2-70s on 5L [place]. Pt just got back from HD(3.5L taken off) where she was on 5L [place] with stable SpO2. Time Called: 0145 Arrival Time: 0147 Event Type: Respiratory  Initial Focused Assessment: Pt asleep in bed, no respiratory distress noted. HR-90s (SR), RR-28, SpO2-85% on 15L NRB. Lungs diminished t/o. Pt awakened, is alert and and oriented, and follows commands. Pt's SpO2 increased to 93% while awake. FiO2 titrated down to 6L  with SpO2-88-93%.  Interventions: NRB, titrated down to 6L . [place], NP to bedside to asses pt. SpO2 goal >88%. Plan of Care (if not transferred): Continue to monitor pt. Titrate FiO2 for SpO2 >88%. Call RRT if further assistance needed. Event Summary: Name of Physician Notified: Kennon Holter, NP at 0150    at    Outcome: Stayed in room and stabalized  Event End Time: 0215  Joan Mosley

## 2018-09-09 NOTE — Progress Notes (Signed)
[facility] Progress Note   Subjective:   Seen in room - this AM developed worsening hypoxia and needed NRB now on Bipap.  Son at bedside.  They are amenable to SNF placement now.   Objective Vitals:   09/09/18 0744 09/09/18 0753 09/09/18 0820 09/09/18 0822  BP:      Pulse:  97    Resp:  18    Temp:      TempSrc:      SpO2: 96% 96% (!) 78% 91%  Weight:      Height:       Physical Exam General: chronically ill but nontoxic appearing on Bipap Heart: RRR, no rub Lungs: normal WOB on bipap, no rales or wheezes appreciated Abdomen: soft Extremities: trace edema, ruddy discoloration of pretibial skin c/w prior Dialysis Access:  [facility] c/d/i  Additional Objective Labs: Basic Metabolic Panel: Recent Labs  Lab 09/07/18 0353 09/08/18 1530 09/08/18 1541 09/09/18 0231  NA 135 137 134* 137  K 4.8 5.2* 5.1 3.7  CL 95* 93*  --  99  CO2 27 28  --  26  GLUCOSE 118* 83  --  106*  BUN 32* 51*  --  13  CREATININE 7.69* 10.49*  --  4.15*  CALCIUM 9.0 8.8*  --  8.8*   Liver Function Tests: Recent Labs  Lab 09/08/18 1530  AST 10*  ALT 11  ALKPHOS 89  BILITOT 0.7  PROT 6.7  ALBUMIN 3.1*   No results for input(s): LIPASE, AMYLASE in the last 168 hours. CBC: Recent Labs  Lab 09/06/18 0744 09/07/18 0353 09/08/18 1530 09/08/18 1541 09/09/18 0231  WBC 11.8* 10.1 11.7*  --  10.3  NEUTROABS 9.4*  --   --   --   --   HGB 9.4* 9.2* 8.5* 11.6* 9.6*  HCT 30.9* 30.0* 28.3* 34.0* 31.3*  MCV 96.6 97.1 99.6  --  97.5  PLT 171 161 151  --  155   Blood Culture    Component Value Date/Time   SDES BLOOD SITE NOT SPECIFIED 09/08/2018 1514   SPECREQUEST  09/08/2018 1514    BOTTLES DRAWN AEROBIC AND ANAEROBIC Blood Culture results may not be optimal due to an inadequate volume of blood received in culture bottles   CULT  09/08/2018 1514    NO GROWTH < 24 HOURS Performed at [facility], [place] [address] [place], [place] [address]    REPTSTATUS PENDING 09/08/2018  1514    Cardiac Enzymes: No results for input(s): CKTOTAL, CKMB, CKMBINDEX, TROPONINI in the last 168 hours. CBG: Recent Labs  Lab 09/06/18 0754 09/06/18 1152 09/06/18 1832 09/06/18 2118 09/07/18 0605  GLUCAP 176* 128* 83 142* 122*   Iron Studies: No results for input(s): IRON, TIBC, TRANSFERRIN, FERRITIN in the last 72 hours. @lablastinr3 @ Studies/Results: Ct Head Wo Contrast  Result Date: 09/08/2018 CLINICAL DATA:  Unexplained altered level of consciousness. Week. Lethargic. Dialysis patient. EXAM: CT HEAD WITHOUT CONTRAST TECHNIQUE: Contiguous axial images were obtained from the base of the skull through the vertex without intravenous contrast. COMPARISON:  09/06/2018 FINDINGS: Brain: No change over the last 2 days. Bilateral mixed density subdural hematomas, approximately 1 cm thickness on each side. No evidence of enlargement or additional hyperdense bleeding. Because there is brain atrophy, there is only minimal mass effect. Left-to-right midline shift of 2 mm. No intraparenchymal hemorrhage. No acute parenchymal infarction. Chronic small-vessel ischemic changes affecting the pons, thalami and hemispheric white matter. Vascular: There is atherosclerotic calcification of the major vessels  at the base of the brain. Skull: Negative Sinuses/Orbits: Chronic right maxillary sinusitis. Mucosal inflammation of the frontal and ethmoid regions on the right as well. Other: None IMPRESSION: No significant change over the last 2 days. Mixed density bilateral convexity subdural hematomas, proximal thickness 1 cm on each side. No additional hyperdense bleeding. Minimal mass effect with left-to-right shift of 2 mm. Electronically Signed   By: Nelson Chimes M.D.   On: 09/08/2018 17:24   Dg Chest Port 1 View  Result Date: 09/08/2018 CLINICAL DATA:  Weakness for 3 days. EXAM: PORTABLE CHEST 1 VIEW COMPARISON:  August 22, 2018 FINDINGS: Dual lumen injectable port in stable position. Mildly enlarged cardiac  silhouette. Bibasilar atelectasis versus peribronchial airspace consolidation. Osseous structures are without acute abnormality. Soft tissues are grossly normal. IMPRESSION: Bibasilar atelectasis versus peribronchial airspace consolidation. Electronically Signed   By: Fidela Salisbury M.D.   On: 09/08/2018 15:15   Medications: . sodium chloride    . sodium chloride    . sodium chloride     . albuterol  2.5 mg Nebulization Q6H  . [START ON 09/10/2018] calcitRIOL  1.5 mcg Oral Q T,Th,Sa-HD  . Chlorhexidine Gluconate Cloth  6 each Topical Q0600  . [START ON 09/10/2018] cinacalcet  60 mg Oral Q T,Th,Sa-HD  . FLUoxetine  10 mg Oral Daily  . furosemide  80 mg Oral Once per day on Sun Mon Wed Fri  . heparin  5,000 Units Subcutaneous Q12H  . insulin detemir  15 Units Subcutaneous Daily  . ipratropium  0.5 mg Nebulization Q6H  . levothyroxine  112 mcg Oral QAC breakfast  . [START ON 09/10/2018] mouth rinse  15 mL Mouth Rinse BID  . [START ON 09/10/2018] midodrine  10 mg Oral 3 times per day on Tue Thu Sat  . montelukast  10 mg Oral QHS  . mupirocin ointment  1 application Nasal BID  . rosuvastatin  40 mg Oral Daily  . senna-docusate  1 tablet Oral QHS  . sevelamer carbonate  2,400 mg Oral TID WC  . sodium chloride flush  3 mL Intravenous Q12H    [facility] T: 4 hr; 180NRe; DFR 400/DFR auto 1.5; EDW 105.5kg, 2K/2Ca No heparin bolus Calcitriol 1.5 mcg PO qHD Sensipar 60 mg PO qHD renvela 800mg  3 tabs AC Midodrine 10 mg PO TIW before HD and mid treatment for SBP <95  Assessment/Plan: 1. Weakness, repeated falls: known SDH, debility.  Agreeable to SNF now.   2.  Hypoxia, baseline COPD:  Chronic O2 requirement, worse this AM.  Tolerated UF 3L last night.  Currently ok on Bipap.  Port CXR ordered.  Can provide extra UF if needed, but I doubt the issue is pulmonary edema.   2. ESRD: TTS.  Tolerated HD last night, plan next HD tomorrow AM unless CXR with  pulmonary edema will do extra tx today.  No heparin with SDH.  3. HTN/volume:  Midodrine pre HD.    4. Anemia: Hb 9.6, CTM  5. Secondary hyperparathyroidism:  gets calcitriol/ sensipar in HD, renvela as binder  6. Nutrition:  Albumin 3.1. prostat BID here.   7.  Dispo: agreeable to SNF.  Await placement.   Jannifer Hick MD 09/09/2018, 8:29 AM  [facility] Pager: [phone]

## 2018-09-09 NOTE — Progress Notes (Signed)
PT Cancellation Note  Patient Details Name: Joan Mosley MRN: [id number] DOB: 23-Aug-1945   Cancelled Treatment:    Reason Eval/Treat Not Completed: Medical issues which prohibited therapy. Pt with continued respiratory issues.    [place] 09/09/2018, 4:11 PM [place] Pager [phone] Office [phone]

## 2018-09-09 NOTE — Significant Event (Signed)
Rapid Response Event Note  Overview: Time Called: [id number] Arrival Time: [id number] Event Type: Respiratory  Initial Focused Assessment: Per RN patient with multiple desat events this morning when she is asleep.  Per RN desat into the 70s on 6L [place].  Currently on NRB O2 sats 98%.  Per RN they are able to wean O2 but then she desats again. Patient is in no distress,  Lung sounds clear decreased bases.  She is conversant and easy to arouse but drifts off to sleep rapidly when not interacting with staff. Reviewed ABG from yesterday, 7.28/65/30 Patient ESRD on HD per notes has missed a couple of treatments, but received HD yesterday here at the hospital.  Normally TTS  Interventions: Recommended trail of Bipap rather than keeping patient on NRB for multiple hours.  Dr Maylene Roes at bedside to assess patient.  Bipap orders recieved  Plan of Care (if not transferred): RN to call if assistance needed  Event Summary: Name of Physician Notified: Dessa Phi at 0150    at    Outcome: Stayed in room and stabalized  Event End Time: 0920  Raliegh Ip

## 2018-09-10 LAB — CBC
HCT: 28.8 % — ABNORMAL LOW (ref 36.0–46.0)
HCT: 27.9 % — ABNORMAL LOW (ref 36.0–46.0)
Hemoglobin: 8.9 g/dL — ABNORMAL LOW (ref 12.0–15.0)
Hemoglobin: 8.5 g/dL — ABNORMAL LOW (ref 12.0–15.0)
MCH: 29.8 pg (ref 26.0–34.0)
MCH: 29.8 pg (ref 26.0–34.0)
MCHC: 30.9 g/dL (ref 30.0–36.0)
MCHC: 30.5 g/dL (ref 30.0–36.0)
MCV: 96.3 fL (ref 80.0–100.0)
MCV: 97.9 fL (ref 80.0–100.0)
Platelets: 148 10*3/uL — ABNORMAL LOW (ref 150–400)
Platelets: 169 10*3/uL (ref 150–400)
RBC: 2.99 MIL/uL — ABNORMAL LOW (ref 3.87–5.11)
RBC: 2.85 MIL/uL — ABNORMAL LOW (ref 3.87–5.11)
RDW: 14.9 % (ref 11.5–15.5)
RDW: 15 % (ref 11.5–15.5)
WBC: 5.6 10*3/uL (ref 4.0–10.5)
WBC: 6.3 10*3/uL (ref 4.0–10.5)
nRBC: 0 % (ref 0.0–0.2)
nRBC: 0 % (ref 0.0–0.2)

## 2018-09-10 LAB — BASIC METABOLIC PANEL
Anion gap: 12 (ref 5–15)
BUN: 38 mg/dL — ABNORMAL HIGH (ref 8–23)
CO2: 24 mmol/L (ref 22–32)
Calcium: 9.1 mg/dL (ref 8.9–10.3)
Chloride: 97 mmol/L — ABNORMAL LOW (ref 98–111)
Creatinine, Ser: 6.59 mg/dL — ABNORMAL HIGH (ref 0.44–1.00)
GFR calc Af Amer: 7 mL/min — ABNORMAL LOW (ref >60)
GFR calc non Af Amer: 6 mL/min — ABNORMAL LOW (ref >60)
Glucose, Bld: 178 mg/dL — ABNORMAL HIGH (ref 70–99)
Potassium: 5.3 mmol/L — ABNORMAL HIGH (ref 3.5–5.1)
Sodium: 133 mmol/L — ABNORMAL LOW (ref 135–145)

## 2018-09-10 LAB — GLUCOSE, CAPILLARY
Glucose-Capillary: 147 mg/dL — ABNORMAL HIGH (ref 70–99)
Glucose-Capillary: 176 mg/dL — ABNORMAL HIGH (ref 70–99)
Glucose-Capillary: 236 mg/dL — ABNORMAL HIGH (ref 70–99)

## 2018-09-10 MED ORDER — ALTEPLASE 2 MG IJ SOLR: 2.0000 mg | Freq: Once | INTRAMUSCULAR | Status: DC | PRN

## 2018-09-10 MED ORDER — CINACALCET HCL 30 MG PO TABS
ORAL_TABLET | ORAL | Status: AC
  Filled 2018-09-10: qty 2

## 2018-09-10 MED ORDER — DARBEPOETIN ALFA 60 MCG/0.3ML IJ SOSY: 60.0000 ug | PREFILLED_SYRINGE | Freq: Once | INTRAMUSCULAR | Status: DC

## 2018-09-10 MED ORDER — HEPARIN SODIUM (PORCINE) 1000 UNIT/ML DIALYSIS
1000.0000 [IU] | INTRAMUSCULAR | Status: DC | PRN
  Administered 2018-09-10: 4000 [IU] via INTRAVENOUS_CENTRAL
  Filled 2018-09-10: qty 1

## 2018-09-10 MED ORDER — HEPARIN SODIUM (PORCINE) 1000 UNIT/ML IJ SOLN
INTRAMUSCULAR | Status: AC
  Filled 2018-09-10: qty 4

## 2018-09-10 MED ORDER — GUAIFENESIN-DM 100-10 MG/5ML PO SYRP
5.0000 mL | ORAL_SOLUTION | ORAL | Status: DC | PRN
  Administered 2018-09-10 – 2018-09-12 (×5): 5 mL via ORAL
  Filled 2018-09-10 (×5): qty 5

## 2018-09-10 MED ORDER — CALCITRIOL 0.5 MCG PO CAPS
ORAL_CAPSULE | ORAL | Status: AC
  Filled 2018-09-10: qty 3

## 2018-09-10 NOTE — ED Provider Notes (Addendum)
[facility] Emergency Department Provider Note MRN:  [id number]  Arrival date & time: 09/10/18     Chief Complaint   Weakness   History of Present Illness   Joan Mosley is a 73 y.o. year-old female with a history of COPD, diabetes presenting to the ED with chief complaint of weakness.  Increased generalized weakness, malaise, shortness of breath for the past 1 to 2 days.  Did her normal dialysis 2 days ago.  Denies fever, no cough.  Denies chest pain, no abdominal pain, no dysuria.  Symptoms are constant, moderate in severity, no exacerbating relieving factors.  Review of Systems  A complete 10 system review of systems was obtained and all systems are negative except as noted in the HPI and PMH.   Patient's Health History    Past Medical History:  Diagnosis Date  . Arthritis   . COPD (chronic obstructive pulmonary disease) ([place])   . Diabetes mellitus without complication ([place])   . Diabetic neuropathy ([place])   . 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 Renal disorder    ESRD  . Thyroid disease   . TIA (transient ischemic attack)     Past Surgical History:  Procedure Laterality Date  . ABDOMINAL HYSTERECTOMY    . RECTOCELE REPAIR    . REPLACEMENT TOTAL KNEE BILATERAL Bilateral 2008  . THYROIDECTOMY     80%  . VAGINAL WOUND CLOSURE / REPAIR      Family History  Problem Relation Age of Onset  . Heart disease Mother   . Hypertension Mother   . Heart disease Father   . Hypertension Father     Social History   Socioeconomic History  . Marital status: Widowed    Spouse name: Not on file  . Number of children: 5  . Years of education: Not on file  . Highest education level: Not on file  Occupational History    Comment: disabled  Social Needs  . Financial resource strain: Not on file  . Food insecurity    Worry: Not on file    Inability: Not on file  . Transportation needs    Medical: Not on file    Non-medical: Not on file  Tobacco Use   . Smoking status: Former Smoker    Packs/day: 1.00    Years: 35.00    Pack years: 35.00    Quit date: 03/29/1981    Years since quitting: 37.4  . Smokeless tobacco: Never Used  Substance and Sexual Activity  . Alcohol use: Never    Frequency: Never  . Drug use: Never  . Sexual activity: Not Currently  Lifestyle  . Physical activity    Days per week: Not on file    Minutes per session: Not on file  . Stress: Not on file  Relationships  . Social Herbalist on phone: Not on file    Gets together: Not on file    Attends religious service: Not on file    Active member of club or organization: Not on file    Attends meetings of clubs or organizations: Not on file    Relationship status: Not on file  . Intimate partner violence    Fear of current or ex partner: Not on file    Emotionally abused: Not on file    Physically abused: Not on file    Forced sexual activity: Not on file  Other Topics Concern  . Not on file  Social  History Narrative  . Not on file     Physical Exam  Vital Signs and Nursing Notes reviewed Vitals:   09/10/18 1922 09/10/18 1953  BP: 129/79   Pulse: 80 85  Resp: (!) 23 (!) 26  Temp: 98.1 F (36.7 C)   SpO2: 97% 95%    CONSTITUTIONAL: Chronically ill-appearing, NAD NEURO:  Alert and oriented x 3, no focal deficits EYES:  eyes equal and reactive ENT/NECK:  no LAD, no JVD CARDIO: Regular rate, well-perfused, normal S1 and S2 PULM:  CTAB no wheezing or rhonchi GI/GU:  normal bowel sounds, non-distended, non-tender MSK/SPINE:  No gross deformities, no edema SKIN:  no rash, atraumatic PSYCH:  Appropriate speech and behavior  Diagnostic and Interventional Summary    EKG Interpretation  Date/Time:  Thursday September 08 2018 14:47:37 EDT Ventricular Rate:  95 PR Interval:    QRS Duration: 96 QT Interval:  355 QTC Calculation: 447 R Axis:   42 Text Interpretation:  Sinus rhythm Borderline prolonged PR interval Abnormal T, consider  ischemia, anterior leads Confirmed by Gerlene Fee [phone]) on 09/08/2018 3:11:49 PM      Labs Reviewed  MRSA PCR SCREENING - Abnormal; Notable for the following components:      Result Value   MRSA by PCR POSITIVE (*)    All other components within normal limits  CBC - Abnormal; Notable for the following components:   WBC 11.7 (*)    RBC 2.84 (*)    Hemoglobin 8.5 (*)    HCT 28.3 (*)    All other components within normal limits  COMPREHENSIVE METABOLIC PANEL - Abnormal; Notable for the following components:   Potassium 5.2 (*)    Chloride 93 (*)    BUN 51 (*)    Creatinine, Ser 10.49 (*)    Calcium 8.8 (*)    Albumin 3.1 (*)    AST 10 (*)    GFR calc non Af Amer 3 (*)    GFR calc Af Amer 4 (*)    Anion gap 16 (*)    All other components within normal limits  BRAIN NATRIURETIC PEPTIDE - Abnormal; Notable for the following components:   B Natriuretic Peptide 312.3 (*)    All other components within normal limits  BASIC METABOLIC PANEL - Abnormal; Notable for the following components:   Glucose, Bld 106 (*)    Creatinine, Ser 4.15 (*)    Calcium 8.8 (*)    GFR calc non Af Amer 10 (*)    GFR calc Af Amer 11 (*)    All other components within normal limits  CBC - Abnormal; Notable for the following components:   RBC 3.21 (*)    Hemoglobin 9.6 (*)    HCT 31.3 (*)    All other components within normal limits  GLUCOSE, CAPILLARY - Abnormal; Notable for the following components:   Glucose-Capillary 149 (*)    All other components within normal limits  BASIC METABOLIC PANEL - Abnormal; Notable for the following components:   Sodium 133 (*)    Potassium 5.3 (*)    Chloride 97 (*)    Glucose, Bld 178 (*)    BUN 38 (*)    Creatinine, Ser 6.59 (*)    GFR calc non Af Amer 6 (*)    GFR calc Af Amer 7 (*)    All other components within normal limits  CBC - Abnormal; Notable for the following components:   RBC 2.99 (*)    Hemoglobin 8.9 (*)  HCT 28.8 (*)    Platelets 148  (*)    All other components within normal limits  GLUCOSE, CAPILLARY - Abnormal; Notable for the following components:   Glucose-Capillary 162 (*)    All other components within normal limits  CBC - Abnormal; Notable for the following components:   RBC 2.85 (*)    Hemoglobin 8.5 (*)    HCT 27.9 (*)    All other components within normal limits  GLUCOSE, CAPILLARY - Abnormal; Notable for the following components:   Glucose-Capillary 147 (*)    All other components within normal limits  GLUCOSE, CAPILLARY - Abnormal; Notable for the following components:   Glucose-Capillary 176 (*)    All other components within normal limits  GLUCOSE, CAPILLARY - Abnormal; Notable for the following components:   Glucose-Capillary 236 (*)    All other components within normal limits  POCT I-STAT EG7 - Abnormal; Notable for the following components:   pCO2, Ven 65.0 (*)    pO2, Ven 126.0 (*)    Bicarbonate 30.4 (*)    Sodium 134 (*)    Calcium, Ion 1.08 (*)    HCT 34.0 (*)    Hemoglobin 11.6 (*)    All other components within normal limits  TROPONIN I (HIGH SENSITIVITY) - Abnormal; Notable for the following components:   Troponin I (High Sensitivity) 22 (*)    All other components within normal limits  TROPONIN I (HIGH SENSITIVITY) - Abnormal; Notable for the following components:   Troponin I (High Sensitivity) 22 (*)    All other components within normal limits  CULTURE, BLOOD (ROUTINE X 2)  CULTURE, BLOOD (ROUTINE X 2)  SARS CORONAVIRUS 2 (HOSPITAL ORDER, [facility])  LACTIC ACID, PLASMA  APTT  PROTIME-INR  GLUCOSE, CAPILLARY  GLUCOSE, CAPILLARY  BASIC METABOLIC PANEL    DG CHEST PORT 1 VIEW  Final Result    CT HEAD WO CONTRAST  Final Result    DG Chest Port 1 View  Final Result      Medications  Chlorhexidine Gluconate Cloth 2 % PADS 6 each (6 each Topical Given 09/10/18 0557)  pentafluoroprop-tetrafluoroeth (GEBAUERS) aerosol 1 application (has no  administration in time range)  lidocaine (PF) (XYLOCAINE) 1 % injection 5 mL (has no administration in time range)  lidocaine-prilocaine (EMLA) cream 1 application (has no administration in time range)  0.9 %  sodium chloride infusion (has no administration in time range)  0.9 %  sodium chloride infusion (has no administration in time range)  montelukast (SINGULAIR) tablet 10 mg (10 mg Oral Given 09/10/18 2145)  rosuvastatin (CRESTOR) tablet 40 mg (40 mg Oral Not Given 09/10/18 1000)  lidocaine (LIDODERM) 5 % 1 patch (has no administration in time range)  levothyroxine (SYNTHROID) tablet 112 mcg (112 mcg Oral Given 09/10/18 0554)  insulin detemir (LEVEMIR) injection 15 Units (15 Units Subcutaneous Not Given 09/10/18 1000)  senna-docusate (Senokot-S) tablet 1 tablet (1 tablet Oral Not Given 09/09/18 2152)  acetaminophen (TYLENOL) tablet 1,000 mg (1,000 mg Oral Given 09/09/18 0106)  clonazePAM (KLONOPIN) tablet 1 mg (1 mg Oral Given 09/09/18 0106)  FLUoxetine (PROZAC) capsule 10 mg (10 mg Oral Not Given 09/10/18 1457)  sodium chloride flush (NS) 0.9 % injection 3 mL (3 mLs Intravenous Given 09/10/18 2142)  sodium chloride flush (NS) 0.9 % injection 3 mL (has no administration in time range)  0.9 %  sodium chloride infusion (has no administration in time range)  midodrine (PROAMATINE) 5 MG tablet (  Not Given 09/08/18 2024)  albuterol (PROVENTIL) (2.5 MG/3ML) 0.083% nebulizer solution 2.5 mg (2.5 mg Nebulization Given 09/10/18 1953)  albuterol (PROVENTIL) (2.5 MG/3ML) 0.083% nebulizer solution 2.5 mg (has no administration in time range)  ipratropium (ATROVENT) nebulizer solution 0.5 mg (0.5 mg Nebulization Given 09/10/18 1953)  cinacalcet (SENSIPAR) tablet 60 mg (60 mg Oral Given 09/10/18 1048)  sevelamer carbonate (RENVELA) tablet 2,400 mg (2,400 mg Oral Given 09/10/18 1721)  calcitRIOL (ROCALTROL) capsule 1.5 mcg (1.5 mcg Oral Given 09/10/18 1049)  heparin injection 1,000 Units (3,700 Units Intravenous  Given 09/08/18 2329)  furosemide (LASIX) tablet 80 mg (80 mg Oral Not Given 09/09/18 1151)  midodrine (PROAMATINE) tablet 10 mg (10 mg Oral Given 09/10/18 1720)  MEDLINE mouth rinse (15 mLs Mouth Rinse Given 09/10/18 2144)  mupirocin ointment (BACTROBAN) 2 % 1 application (1 application Nasal Not Given 09/10/18 1502)  feeding supplement (PRO-STAT SUGAR FREE 64) liquid 30 mL (30 mLs Oral Given 09/10/18 2144)  insulin aspart (novoLOG) injection 0-9 Units (2 Units Subcutaneous Given 09/10/18 1721)  methylPREDNISolone sodium succinate (SOLU-MEDROL) 40 mg/mL injection 40 mg (40 mg Intravenous Given 09/10/18 2143)  azithromycin (ZITHROMAX) 500 mg in sodium chloride 0.9 % 250 mL IVPB (500 mg Intravenous Not Given 09/10/18 1230)  Darbepoetin Alfa (ARANESP) injection 60 mcg (60 mcg Intravenous Not Given 09/10/18 1456)  heparin 1000 UNIT/ML injection (has no administration in time range)  guaiFENesin-dextromethorphan (ROBITUSSIN DM) 100-10 MG/5ML syrup 5 mL (5 mLs Oral Given 09/10/18 1720)  midodrine (PROAMATINE) tablet 10 mg (10 mg Oral Given 09/08/18 1949)  calcitRIOL (ROCALTROL) 0.5 MCG capsule (  Duplicate 0000000 [id number])  cinacalcet (SENSIPAR) 30 MG tablet (  Duplicate 0000000 [id number])     .Critical Care Performed by: Maudie Flakes, MD Authorized by: Maudie Flakes, MD   Critical care provider statement:    Critical care time (minutes):  34   Critical care time was exclusive of:  Separately billable procedures and treating other patients   Critical care was necessary to treat or prevent imminent or life-threatening deterioration of the following conditions:  Circulatory failure and renal failure   Critical care was time spent personally by me on the following activities:  Discussions with consultants, evaluation of patient's response to treatment, examination of patient, review of old charts, re-evaluation of patient's condition, pulse oximetry, ordering and review of radiographic studies, ordering and review  of laboratory studies and ordering and performing treatments and interventions   Critical Care  ED Course and Medical Decision Making  I have reviewed the triage vital signs and the nursing notes.  Pertinent labs & imaging results that were available during my care of the patient were reviewed by me and considered in my medical decision making (see below for details).  Increased oxygen requirement, shortness of breath and this ESRD 72 year old female.  No chest pain, EKG reassuring.  Chest x-ray suggestive of fluid overloaded state.  Nephrology consulted, admitted to hospital service for further care.  Barth Kirks. Sedonia Small, [place] [email or web address]  Final Clinical Impressions(s) / ED Diagnoses     ICD-10-CM   1. Hypervolemia, unspecified hypervolemia type  E87.70   2. Weakness  R53.1 DG Chest [facility] 1 View    DG Chest Port 1 View  3. Hypoxia  R09.02 DG CHEST PORT 1 VIEW    DG CHEST PORT 1 VIEW    ED Discharge Orders    None         Maudie Flakes,  MD 09/10/18 2257    Maudie Flakes, MD 09/14/18 [phone]

## 2018-09-10 NOTE — Evaluation (Signed)
Occupational Therapy Evaluation Patient Details Name: Joan Mosley MRN: [id number] DOB: 09-22-1945 Today's Date: 09/10/2018    History of Present Illness Patient is a 73 y/o female presenting with weakness. past medical history significant for ESRD on dialysis Tuesday, Thursday, Saturday, morbid obesity, type 2 diabetes with recent admission for subdural hematoma. Admitted for Acute on chronic hypoxemic respiratory failure.    Clinical Impression   Pt PTA: Pt living with family at home, but unable to properly care for self and requires skilled care. Pt currently performing bed mobility with ModA +2 and transfers deferred due to poor respiratory status, pt on 15L O2 <95% with exertion. Pt rolling in bed with modA to changes sheets as pt unable to tell that she was soiled. Pt sitting EOB x10 mins with intermittent assist from bed rail for support in sitting upright as pt leaning on R elbow when possible. Pt with poor activity tolerance, poor strength and increased need for caregivers to assist. Pt would benefit from continued OT skilled services for ADL, mobility and safety in SNF setting. OT following acutely.    Follow Up Recommendations  SNF;Supervision/Assistance - 24 hour    Equipment Recommendations  Other (comment)(to be determined at next venue)    Recommendations for Other Services       Precautions / Restrictions Precautions Precautions: Fall Restrictions Weight Bearing Restrictions: No      Mobility Bed Mobility Overal bed mobility: Needs Assistance Bed Mobility: Supine to Sit;Sit to Supine     Supine to sit: Mod assist;+2 for physical assistance;+2 for safety/equipment Sit to supine: Mod assist;+2 for physical assistance      Transfers                 General transfer comment: deferred    Balance Overall balance assessment: Needs assistance Sitting-balance support: Single extremity supported;Feet supported Sitting balance-Leahy Scale: Fair   Postural  control: Right lateral lean                                 ADL either performed or assessed with clinical judgement   ADL Overall ADL's : Needs assistance/impaired Eating/Feeding: Set up;Bed level Eating/Feeding Details (indicate cue type and reason): requires containers opened for her Grooming: Wash/dry hands;Wash/dry face;Set up;Bed level Grooming Details (indicate cue type and reason): provided materials -set-upA. Pt refusing mouth rinse. Upper Body Bathing: Moderate assistance;Bed level;Sitting   Lower Body Bathing: Maximal assistance;Total assistance;Bed level;Sitting/lateral leans   Upper Body Dressing : Moderate assistance;Sitting;Bed level   Lower Body Dressing: Maximal assistance;Total assistance;Sitting/lateral leans;Bed level       Toileting- Clothing Manipulation and Hygiene: Moderate assistance;+2 for physical assistance;+2 for safety/equipment;Sitting/lateral lean;Bed level Toileting - Clothing Manipulation Details (indicate cue type and reason): rolling side to side to change linens. Pt unable to attempt standing     Functional mobility during ADLs: Total assistance General ADL Comments: Pt appears weak and unable to care for self; at home, pt reports her daughter helps her with everything with the exception of self feed and grooming.     Vision Baseline Vision/History: No visual deficits Patient Visual Report: No change from baseline Vision Assessment?: No apparent visual deficits     Perception     Praxis      Pertinent Vitals/Pain Pain Assessment: Faces Faces Pain Scale: Hurts little more Pain Location: generalized Pain Descriptors / Indicators: Aching;Discomfort;Guarding Pain Intervention(s): Limited activity within patient's tolerance;Monitored during session;Repositioned     Hand  Dominance Right   Extremity/Trunk Assessment Upper Extremity Assessment Upper Extremity Assessment: Generalized weakness;RUE deficits/detail;LUE  deficits/detail RUE Deficits / Details: <90* shoulder flex, 3-/5 MM grade; swelling present RUE Coordination: decreased fine motor LUE Deficits / Details: <90* shoulder flex, 3-/5 MM grade; swelling present LUE Coordination: decreased fine motor   Lower Extremity Assessment Lower Extremity Assessment: Generalized weakness   Cervical / Trunk Assessment Cervical / Trunk Assessment: Other exceptions Cervical / Trunk Exceptions: large body habitus leans to R   Communication Communication Communication: HOH   Cognition Arousal/Alertness: Awake/alert Behavior During Therapy: Flat affect Overall Cognitive Status: No family/caregiver present to determine baseline cognitive functioning                                     General Comments  15L O2 <95% with exertion    Exercises     Shoulder Instructions      Home Living Family/patient expects to be discharged to:: Private residence Living Arrangements: Children;Other (Comment) Available Help at Discharge: Family;Available 24 hours/day Type of Home: House Home Access: Ramped entrance     Home Layout: One level               Home Equipment: Walker - 2 wheels;Cane - single point;Wheelchair - Press photographer;Bedside commode   Additional Comments: oxygen 5LPM      Prior Functioning/Environment Level of Independence: Needs assistance  Gait / Transfers Assistance Needed: reports that her children assist with transfers ADL's / Homemaking Assistance Needed: Needs assist with bathing/dressing            OT Problem List: Decreased strength;Decreased activity tolerance;Impaired balance (sitting and/or standing);Decreased safety awareness;Obesity;Pain;Increased edema;Decreased range of motion      OT Treatment/Interventions: Self-care/ADL training;Therapeutic exercise;Neuromuscular education;DME and/or AE instruction;Energy conservation;Therapeutic activities;Visual/perceptual  remediation/compensation;Patient/family education;Balance training    OT Goals(Current goals can be found in the care plan section) Acute Rehab OT Goals Patient Stated Goal: go to rehab OT Goal Formulation: With patient Time For Goal Achievement: 09/21/18 Potential to Achieve Goals: Good ADL Goals Pt Will Perform Grooming: with modified independence;sitting Pt Will Perform Upper Body Dressing: with modified independence;sitting Pt Will Transfer to Toilet: with mod assist;stand pivot transfer Additional ADL Goal #1: Pt will sit upright for ADL x15 mins with 1 rest break.  OT Frequency: Min 2X/week   Barriers to D/C:            Co-evaluation PT/OT/SLP Co-Evaluation/Treatment: Yes Reason for Co-Treatment: Complexity of the patient's impairments (multi-system involvement) PT goals addressed during session: Mobility/safety with mobility OT goals addressed during session: ADL's and self-care      AM-PAC OT "6 Clicks" Daily Activity     Outcome Measure Help from another person eating meals?: None Help from another person taking care of personal grooming?: A Little Help from another person toileting, which includes using toliet, bedpan, or urinal?: Total Help from another person bathing (including washing, rinsing, drying)?: A Lot Help from another person to put on and taking off regular upper body clothing?: A Lot Help from another person to put on and taking off regular lower body clothing?: Total 6 Click Score: 13   End of Session Nurse Communication: Mobility status  Activity Tolerance: Patient tolerated treatment well Patient left: in bed;with call bell/phone within reach;with bed alarm set  OT Visit Diagnosis: Unsteadiness on feet (R26.81);Muscle weakness (generalized) (M62.81)  Time: [id number] OT Time Calculation (min): 23 min Charges:  OT General Charges $OT Visit: 1 Visit OT Evaluation $OT Eval Moderate Complexity: 1 Mod  Darryl Nestle) [name]  OTR/L Acute Rehabilitation Services Pager: [phone] Office: [phone]   Audie Pinto 09/10/2018, 2:27 PM

## 2018-09-10 NOTE — Evaluation (Signed)
Physical Therapy Evaluation Patient Details Name: Joan Mosley MRN: [id number] DOB: 06-20-1945 Today's Date: 09/10/2018   History of Present Illness  Patient is a 73 y/o female presenting with weakness. past medical history significant for ESRD on dialysis Tuesday, Thursday, Saturday, morbid obesity, type 2 diabetes with recent admission for subdural hematoma. Admitted for Acute on chronic hypoxemic respiratory failure.     Clinical Impression  Patient admitted with the above. Patient reports weakness limiting mobility currently. At baseline patient able to transfer with family able to assist as needed. Patient today requiring Mod A +2 for bed level mobility to come to EOB and return to supine - deferred transfers currently for patient/therapist safety. Patient on non-rebreather on 15L O2 with SpO2 >95% with all mobility. Will recommend SNF at discharge. PT to follow acutely.     Follow Up Recommendations SNF;Supervision/Assistance - 24 hour    Equipment Recommendations  None recommended by PT    Recommendations for Other Services       Precautions / Restrictions Precautions Precautions: Fall Restrictions Weight Bearing Restrictions: No      Mobility  Bed Mobility Overal bed mobility: Needs Assistance Bed Mobility: Supine to Sit;Sit to Supine     Supine to sit: Mod assist;+2 for physical assistance;+2 for safety/equipment Sit to supine: Mod assist;+2 for physical assistance      Transfers                 General transfer comment: deferred  Ambulation/Gait             General Gait Details: deferred  Stairs            Wheelchair Mobility    Modified Rankin (Stroke Patients Only)       Balance Overall balance assessment: Needs assistance Sitting-balance support: Single extremity supported;Feet supported Sitting balance-Leahy Scale: Fair   Postural control: Right lateral lean                                   Pertinent  Vitals/Pain Pain Assessment: Faces Faces Pain Scale: Hurts little more Pain Location: generalized Pain Descriptors / Indicators: Aching;Discomfort;Guarding Pain Intervention(s): Limited activity within patient's tolerance;Monitored during session;Repositioned    Home Living Family/patient expects to be discharged to:: Private residence Living Arrangements: Children;Other (Comment) Available Help at Discharge: Family;Available 24 hours/day Type of Home: House Home Access: Ramped entrance     Home Layout: One level Home Equipment: Walker - 2 wheels;Cane - single point;Wheelchair - Press photographer;Bedside commode Additional Comments: oxygen 5LPM    Prior Function Level of Independence: Needs assistance   Gait / Transfers Assistance Needed: reports that her children assist with transfers  ADL's / Homemaking Assistance Needed: Needs assist with bathing/dressing        Hand Dominance   Dominant Hand: Right    Extremity/Trunk Assessment   Upper Extremity Assessment Upper Extremity Assessment: Defer to OT evaluation    Lower Extremity Assessment Lower Extremity Assessment: Generalized weakness    Cervical / Trunk Assessment Cervical / Trunk Assessment: Other exceptions Cervical / Trunk Exceptions: large body habitus leans to R  Communication   Communication: HOH  Cognition Arousal/Alertness: Awake/alert Behavior During Therapy: Flat affect Overall Cognitive Status: No family/caregiver present to determine baseline cognitive functioning  General Comments      Exercises     Assessment/Plan    PT Assessment Patient needs continued PT services  PT Problem List Decreased strength;Decreased balance;Decreased mobility;Decreased activity tolerance;Decreased knowledge of use of DME;Decreased knowledge of precautions       PT Treatment Interventions DME instruction;Functional mobility training;Therapeutic  activities;Therapeutic exercise;Balance training;Patient/family education    PT Goals (Current goals can be found in the Care Plan section)  Acute Rehab PT Goals Patient Stated Goal: go to rehab PT Goal Formulation: With patient Time For Goal Achievement: 09/24/18 Potential to Achieve Goals: Fair    Frequency Min 2X/week   Barriers to discharge        Co-evaluation PT/OT/SLP Co-Evaluation/Treatment: Yes Reason for Co-Treatment: Complexity of the patient's impairments (multi-system involvement);To address functional/ADL transfers;For patient/therapist safety PT goals addressed during session: Mobility/safety with mobility         AM-PAC PT "6 Clicks" Mobility  Outcome Measure Help needed turning from your back to your side while in a flat bed without using bedrails?: A Lot Help needed moving from lying on your back to sitting on the side of a flat bed without using bedrails?: A Lot Help needed moving to and from a bed to a chair (including a wheelchair)?: Total Help needed standing up from a chair using your arms (e.g., wheelchair or bedside chair)?: Total Help needed to walk in hospital room?: Total Help needed climbing 3-5 steps with a railing? : Total 6 Click Score: 8    End of Session Equipment Utilized During Treatment: Oxygen Activity Tolerance: Patient tolerated treatment well Patient left: in bed;with call bell/phone within reach Nurse Communication: Mobility status PT Visit Diagnosis: Unsteadiness on feet (R26.81);Repeated falls (R29.6);Muscle weakness (generalized) (M62.81)    Time: [id number] PT Time Calculation (min) (ACUTE ONLY): 21 min   Charges:   PT Evaluation $PT Eval Moderate Complexity: 1 Mod           Lanney Gins, PT, DPT Supplemental Physical Therapist 09/10/18 2:16 PM Pager: [phone] Office: [phone]

## 2018-09-10 NOTE — TOC Initial Note (Signed)
Transition of Care [facility]) - Initial/Assessment Note    Patient Details  Name: Joan Mosley MRN: [id number] Date of Birth: 05-30-1945  Transition of Care [facility]) CM/SW Contact:    Gelene Mink, [place] Phone Number: 09/10/2018, 12:43 PM  Clinical Narrative:                   CSW was contacted by Dr. Maylene Roes, and was asked if the the CSW could work up the patient for SNF. Dr. Maylene Roes stated that the patient is agreeable to SNF as well as her family.   CSW called and spoke with the patient's son. CSW introduced herself and explained her role. The patient's son, Arbutus Oxenreider, stated that his mother, sister, and himself are agreeable for her to go to rehab. They did not have a preference of which facility, he did request if they could look close to her dialysis clinic. The patient's dialysis clinic is off of [address], in [place]. CSW provided SNF's close to that location. He stated that it was fine to call back with bed offers.   CSW obtained permission to work the patient up and fax her out. The patient is currently in hemodialysis and the CSW could not speak with her. CSW will continue to follow.   Expected Discharge Plan: Skilled Nursing Facility Barriers to Discharge: Continued Medical Work up   Patient Goals and CMS Choice Patient states their goals for this hospitalization and ongoing recovery are:: Per MD, pt is agreeable to SNF CMS Medicare.gov Compare Post Acute Care list provided to:: Patient Choice offered to / list presented to : Patient  Expected Discharge Plan and Services Expected Discharge Plan: [place] In-house Referral: Clinical Social Work Discharge Planning Services: NA Post Acute Care Choice: [place] Living arrangements for the past 2 months: Single Family Home                 DME Arranged: N/A DME Agency: NA       HH Arranged: NA HH Agency: NA        Prior Living Arrangements/Services Living arrangements for the past  2 months: [place] Lives with:: Adult Children Patient language and need for interpreter reviewed:: No Do you feel safe going back to the place where you live?: Yes      Need for Family Participation in Patient Care: Yes (Comment) Care giver support system in place?: Yes (comment) Current home services: Other (comment)(Home 02) Criminal Activity/Legal Involvement Pertinent to Current Situation/Hospitalization: No - Comment as needed  Activities of Daily Living Home Assistive Devices/Equipment: CBG Meter, Oxygen, Electric scooter, Nebulizer, Raised toilet seat with rails, Hospital bed ADL Screening (condition at time of admission) Patient's cognitive ability adequate to safely complete daily activities?: Yes Is the patient deaf or have difficulty hearing?: No Does the patient have difficulty seeing, even when wearing glasses/contacts?: No Does the patient have difficulty concentrating, remembering, or making decisions?: No Patient able to express need for assistance with ADLs?: Yes Does the patient have difficulty dressing or bathing?: Yes Independently performs ADLs?: No Communication: Independent Dressing (OT): Dependent Is this a change from baseline?: Pre-admission baseline Grooming: Dependent Is this a change from baseline?: Pre-admission baseline Feeding: Independent Bathing: Dependent Is this a change from baseline?: Pre-admission baseline Toileting: Dependent Is this a change from baseline?: Pre-admission baseline In/Out Bed: Dependent Is this a change from baseline?: Pre-admission baseline Walks in Home: Dependent Is this a change from baseline?: Pre-admission baseline Does the patient have difficulty  walking or climbing stairs?: Yes Weakness of Legs: Both Weakness of Arms/Hands: None  Permission Sought/Granted Permission sought to share information with : Case Manager Permission granted to share information with : Yes, Verbal Permission Granted  Share  Information with NAME: [name] & [name]  Permission granted to share info w AGENCY: All SNF  Permission granted to share info w Relationship: Daughter & Son     Emotional Assessment Appearance:: Appears stated age Attitude/Demeanor/Rapport: Unable to Assess Affect (typically observed): Unable to Assess Orientation: : Oriented to Self, Oriented to Place, Oriented to  Time, Oriented to Situation Alcohol / Substance Use: Not Applicable Psych Involvement: No (comment)  Admission diagnosis:  Weakness [R53.1] Respiratory failure ([place]) [J96.90] Patient Active Problem List   Diagnosis Date Noted  . Pulmonary edema 09/08/2018  . Respiratory failure ([place]) 09/08/2018  . Weakness   . SDH (subdural hematoma) ([place]) 09/06/2018  . Chronic respiratory failure with hypoxia, on home O2 therapy ([place]) 09/06/2018  . Subdural hematoma ([place]) 08/22/2018  . Elevated troponin 08/22/2018  . ESRD (end stage renal disease) ([place]) 08/22/2018  . Anxiety 07/04/2018  . Counseling regarding advanced directives and goals of care 07/01/2018  . Full code status 07/01/2018  . Drug-induced constipation 07/01/2018  . Hemodialysis-associated hypotension 07/01/2018  . Dialysis patient ([place]) 07/01/2018  . HOH (hard of hearing) 07/01/2018  . Stage 5 chronic kidney disease on chronic dialysis ([place]) 07/01/2018  . Type 2 diabetes mellitus with diabetic polyneuropathy, with long-term current use of insulin ([place]) 07/01/2018  . Hypothyroidism 07/01/2018  . Gait instability 07/01/2018  . Hyperkalemia 04/20/2018   PCP:  Flossie Buffy, NP Pharmacy:   [facility] [place], [place] - [address] [place] [address] Phone: [phone] Fax: [phone]     Social Determinants of Health (SDOH) Interventions    Readmission Risk Interventions No flowsheet data found.

## 2018-09-10 NOTE — Progress Notes (Signed)
[facility] Progress Note   Subjective:   CXR yesterday without pulm edema, primary treated for COPD.  Seen on HD this morning.  Has been on NRB with sats 95-99% since 2am.    Objective Vitals:   09/09/18 2326 09/10/18 0131 09/10/18 0134 09/10/18 0300  BP: 126/80   129/75  Pulse: 93 91 95 92  Resp: 18 18  19   Temp: 98.8 F (37.1 C)   98.5 F (36.9 C)  TempSrc: Oral   Oral  SpO2: 96% 90% 91% 98%  Weight:      Height:       Physical Exam General: chronically ill but nontoxic appearing on NRB Heart: RRR, no rub Lungs: normal WOB on NRB, no rales or wheezes appreciated Abdomen: soft Extremities: trace edema, ruddy discoloration of pretibial skin c/w prior Dialysis Access:  [facility] c/d/i  Additional Objective Labs: Basic Metabolic Panel: Recent Labs  Lab 09/08/18 1530 09/08/18 1541 09/09/18 0231 09/10/18 0525  NA 137 134* 137 133*  K 5.2* 5.1 3.7 5.3*  CL 93*  --  99 97*  CO2 28  --  26 24  GLUCOSE 83  --  106* 178*  BUN 51*  --  13 38*  CREATININE 10.49*  --  4.15* 6.59*  CALCIUM 8.8*  --  8.8* 9.1   Liver Function Tests: Recent Labs  Lab 09/08/18 1530  AST 10*  ALT 11  ALKPHOS 89  BILITOT 0.7  PROT 6.7  ALBUMIN 3.1*   No results for input(s): LIPASE, AMYLASE in the last 168 hours. CBC: Recent Labs  Lab 09/06/18 0744 09/07/18 0353 09/08/18 1530 09/08/18 1541 09/09/18 0231 09/10/18 0525  WBC 11.8* 10.1 11.7*  --  10.3 5.6  NEUTROABS 9.4*  --   --   --   --   --   HGB 9.4* 9.2* 8.5* 11.6* 9.6* 8.9*  HCT 30.9* 30.0* 28.3* 34.0* 31.3* 28.8*  MCV 96.6 97.1 99.6  --  97.5 96.3  PLT 171 161 151  --  155 148*   Blood Culture    Component Value Date/Time   SDES BLOOD SITE NOT SPECIFIED 09/08/2018 1514   SPECREQUEST  09/08/2018 1514    BOTTLES DRAWN AEROBIC AND ANAEROBIC Blood Culture results may not be optimal due to an inadequate volume of blood received in culture bottles   CULT  09/08/2018 1514    NO GROWTH < 24 HOURS Performed at  [facility], [place] [address], [place], [place] [address]    REPTSTATUS PENDING 09/08/2018 1514    Cardiac Enzymes: No results for input(s): CKTOTAL, CKMB, CKMBINDEX, TROPONINI in the last 168 hours. CBG: Recent Labs  Lab 09/07/18 0605 09/09/18 1319 09/09/18 1412 09/09/18 1655 09/09/18 2041  GLUCAP 122* 78 82 149* 162*   Iron Studies: No results for input(s): IRON, TIBC, TRANSFERRIN, FERRITIN in the last 72 hours. @lablastinr3 @ Studies/Results: Ct Head Wo Contrast  Result Date: 09/08/2018 CLINICAL DATA:  Unexplained altered level of consciousness. Week. Lethargic. Dialysis patient. EXAM: CT HEAD WITHOUT CONTRAST TECHNIQUE: Contiguous axial images were obtained from the base of the skull through the vertex without intravenous contrast. COMPARISON:  09/06/2018 FINDINGS: Brain: No change over the last 2 days. Bilateral mixed density subdural hematomas, approximately 1 cm thickness on each side. No evidence of enlargement or additional hyperdense bleeding. Because there is brain atrophy, there is only minimal mass effect. Left-to-right midline shift of 2 mm. No intraparenchymal hemorrhage. No acute parenchymal infarction. Chronic small-vessel ischemic changes affecting the pons,  thalami and hemispheric white matter. Vascular: There is atherosclerotic calcification of the major vessels at the base of the brain. Skull: Negative Sinuses/Orbits: Chronic right maxillary sinusitis. Mucosal inflammation of the frontal and ethmoid regions on the right as well. Other: None IMPRESSION: No significant change over the last 2 days. Mixed density bilateral convexity subdural hematomas, proximal thickness 1 cm on each side. No additional hyperdense bleeding. Minimal mass effect with left-to-right shift of 2 mm. Electronically Signed   By: Nelson Chimes M.D.   On: 09/08/2018 17:24   Dg Chest Port 1 View  Result Date: 09/09/2018 CLINICAL DATA:  Shortness of breath, hypoxia EXAM: PORTABLE CHEST 1 VIEW  COMPARISON:  09/08/2018 FINDINGS: Right IJ dialysis catheter tips SVC RA junction. Stable cardiomegaly and bibasilar opacities, favored to represent atelectasis over pneumonia. Small effusions suspected. Overall stable aeration. No pneumothorax. Trachea midline. Aorta atherosclerotic. IMPRESSION: Stable cardiomegaly and bibasilar atelectasis pattern. Atherosclerosis Electronically Signed   By: Jerilynn Mages.  Shick M.D.   On: 09/09/2018 11:45   Dg Chest Port 1 View  Result Date: 09/08/2018 CLINICAL DATA:  Weakness for 3 days. EXAM: PORTABLE CHEST 1 VIEW COMPARISON:  August 22, 2018 FINDINGS: Dual lumen injectable port in stable position. Mildly enlarged cardiac silhouette. Bibasilar atelectasis versus peribronchial airspace consolidation. Osseous structures are without acute abnormality. Soft tissues are grossly normal. IMPRESSION: Bibasilar atelectasis versus peribronchial airspace consolidation. Electronically Signed   By: Fidela Salisbury M.D.   On: 09/08/2018 15:15   Medications: . sodium chloride    . sodium chloride    . sodium chloride    . azithromycin Stopped (09/09/18 1900)   . albuterol  2.5 mg Nebulization Q6H  . calcitRIOL  1.5 mcg Oral Q T,Th,Sa-HD  . Chlorhexidine Gluconate Cloth  6 each Topical Q0600  . cinacalcet  60 mg Oral Q T,Th,Sa-HD  . feeding supplement (PRO-STAT SUGAR FREE 64)  30 mL Oral BID  . FLUoxetine  10 mg Oral Daily  . furosemide  80 mg Oral Once per day on Sun Mon Wed Fri  . insulin aspart  0-9 Units Subcutaneous TID WC  . insulin detemir  15 Units Subcutaneous Daily  . ipratropium  0.5 mg Nebulization Q6H  . levothyroxine  112 mcg Oral QAC breakfast  . mouth rinse  15 mL Mouth Rinse BID  . methylPREDNISolone (SOLU-MEDROL) injection  40 mg Intravenous Q8H  . midodrine  10 mg Oral 3 times per day on Tue Thu Sat  . montelukast  10 mg Oral QHS  . mupirocin ointment  1 application Nasal BID  . rosuvastatin  40 mg Oral Daily  . senna-docusate  1 tablet Oral QHS  .  sevelamer carbonate  2,400 mg Oral TID WC  . sodium chloride flush  3 mL Intravenous Q12H    [facility] T: 4 hr; 180NRe; DFR 400/DFR auto 1.5; EDW 105.5kg, 2K/2Ca No heparin bolus Calcitriol 1.5 mcg PO qHD Sensipar 60 mg PO qHD renvela 800mg  3 tabs AC Midodrine 10 mg PO TIW before HD and mid treatment for SBP <95  Assessment/Plan: 1. Weakness, repeated falls: known SDH, debility.  Agreeable to SNF now.   2.  Hypoxia, baseline COPD:  Chronic O2 requirement of 5L.  Tolerated UF 3L 8/13 PM.  X ray without pulmonary edema.    2. ESRD: TTS.  HD today per schedule.  UF  No heparin with SDH.  3. HTN/volume:  Midodrine pre HD.    4. Anemia: Hb 8.9, start ESA.   5.  Secondary hyperparathyroidism:  gets calcitriol/ sensipar in HD, renvela as binder  6. Nutrition:  Albumin 3.1. prostat BID here.   7.  Dispo: agreeable to SNF.  Await placement.  Ok to d/c once plans in place.   Jannifer Hick MD 09/10/2018, 8:16 AM  [facility] Pager: [phone]

## 2018-09-10 NOTE — Progress Notes (Signed)
PROGRESS NOTE    Joan Mosley  [id number] DOB: 1946-01-25 DOA: 09/08/2018 PCP: Flossie Buffy, NP     Brief Narrative:  Joan Mosley is a 73 year old female with past medical history significant for ESRD on dialysis Tuesday, Thursday, Saturday, morbid obesity, type 2 diabetes with recent admission for subdural hematoma and discharged home on 8/12 after refusing SNF.  She returns to the hospital with chief complaint of weakness, lethargy, as well as worsening acute on chronic hypoxemic respiratory failure. Rapid response called 8/14 AM, multiple desaturation events.  She was de-satting into the 70s on 6 L nasal cannula, increased to nonrebreather to maintain saturation.  She was started on BiPAP.  New events last 24 hours / Subjective: Off BiPAP this morning, seen in hemodialysis.  Denies any worsening shortness of breath or other complaints.  Wondering when she is going to discharge to SNF.  Assessment & Plan:   Active Problems:   Pulmonary edema   Respiratory failure (HCC)   Acute on chronic hypoxemic respiratory failure -Patient on 5 L nasal cannula O2 at baseline -COVID-19 negative -Chest x-ray 8/13 with bibasilar atelectasis versus peribronchial airspace consolidation.  Personally reviewed, no significant change from x-ray in July 2020 -Off BiPAP since yesterday  COPD exacerbation -Breathing treatments.  Continue Solu-Medrol today as well as azithromycin for atypical coverage  ESRD on hemodialysis TThSa -Appreciate nephrology  Hypotension -Continue midodrine during dialysis  Diabetes mellitus type 2 -Continue Levemir, sliding scale insulin  Hypothyroidism -Continue Synthroid  Hyperlipidemia -Continue Crestor  Subdural hematoma -Repeat CT head 8/13 without significant change, mixed density bilateral convexity subdural hematoma, proximal thickness 1 cm on each side.  No additional hyperdense bleeding.  Minimal mass-effect   Demand ischemia -Elevated  troponin in setting of ESRD, troponin has been flat 22--> 22  Deconditioning -SNF placement was recommended previous admission, which patient had refused -Agreeable to SNF placement this time.  PT OT and social worker consulted  Depression, anxiety -Continue Prozac, Klonopin          DVT prophylaxis: SCD Code Status: Full code Family Communication: None Disposition Plan: Patient agreeable to SNF placement   Consultants:   Nephrology  Procedures:   None  Antimicrobials:  Anti-infectives (From admission, onward)   Start     Dose/Rate Route Frequency Ordered Stop   09/09/18 1230  azithromycin (ZITHROMAX) 500 mg in sodium chloride 0.9 % 250 mL IVPB     500 mg 250 mL/hr over 60 Minutes Intravenous Every 24 hours 09/09/18 1135         Objective: Vitals:   09/10/18 1000 09/10/18 1030 09/10/18 1100 09/10/18 1130  BP: 140/87 116/86 115/82 116/80  Pulse: 83 70 69 84  Resp:      Temp:      TempSrc:      SpO2:      Weight:      Height:       No intake or output data in the 24 hours ending 09/10/18 1157 Filed Weights   09/09/18 0009 09/10/18 0748  Weight: 106.1 kg 110 kg    Examination: General exam: Appears calm and comfortable  Respiratory system: Clear to auscultation anteriorly.  Respiratory effort normal.  On Venturi mask Cardiovascular system: S1 & S2 heard, RRR. No JVD, murmurs, rubs, gallops or clicks. No pedal edema. Gastrointestinal system: Abdomen is nondistended, soft and nontender. No organomegaly or masses felt. Normal bowel sounds heard. Central nervous system: Alert and oriented. No focal neurological deficits. Extremities: Symmetric 5 x 5 power. Skin:  No rashes, lesions or ulcers Psychiatry: Judgement and insight appear normal. Mood & affect appropriate.    Data Reviewed: I have personally reviewed following labs and imaging studies  CBC: Recent Labs  Lab 09/06/18 0744 09/07/18 0353 09/08/18 1530 09/08/18 1541 09/09/18 0231 09/10/18  0525 09/10/18 0815  WBC 11.8* 10.1 11.7*  --  10.3 5.6 6.3  NEUTROABS 9.4*  --   --   --   --   --   --   HGB 9.4* 9.2* 8.5* 11.6* 9.6* 8.9* 8.5*  HCT 30.9* 30.0* 28.3* 34.0* 31.3* 28.8* 27.9*  MCV 96.6 97.1 99.6  --  97.5 96.3 97.9  PLT 171 161 151  --  155 148* [id number]   Basic Metabolic Panel: Recent Labs  Lab 09/06/18 0744 09/07/18 0353 09/08/18 1530 09/08/18 1541 09/09/18 0231 09/10/18 0525  NA 135 135 137 134* 137 133*  K 5.4* 4.8 5.2* 5.1 3.7 5.3*  CL 92* 95* 93*  --  99 97*  CO2 24 27 28   --  26 24  GLUCOSE 237* 118* 83  --  106* 178*  BUN 74* 32* 51*  --  13 38*  CREATININE 13.46* 7.69* 10.49*  --  4.15* 6.59*  CALCIUM 8.7* 9.0 8.8*  --  8.8* 9.1   GFR: Estimated Creatinine Clearance: 8.5 mL/min (A) (by C-G formula based on SCr of 6.59 mg/dL (H)). Liver Function Tests: Recent Labs  Lab 09/08/18 1530  AST 10*  ALT 11  ALKPHOS 89  BILITOT 0.7  PROT 6.7  ALBUMIN 3.1*   No results for input(s): LIPASE, AMYLASE in the last 168 hours. No results for input(s): AMMONIA in the last 168 hours. Coagulation Profile: Recent Labs  Lab 09/08/18 1530  INR 1.1   Cardiac Enzymes: No results for input(s): CKTOTAL, CKMB, CKMBINDEX, TROPONINI in the last 168 hours. BNP (last 3 results) No results for input(s): PROBNP in the last 8760 hours. HbA1C: No results for input(s): HGBA1C in the last 72 hours. CBG: Recent Labs  Lab 09/07/18 0605 09/09/18 1319 09/09/18 1412 09/09/18 1655 09/09/18 2041  GLUCAP 122* 78 82 149* 162*   Lipid Profile: No results for input(s): CHOL, HDL, LDLCALC, TRIG, CHOLHDL, LDLDIRECT in the last 72 hours. Thyroid Function Tests: No results for input(s): TSH, T4TOTAL, FREET4, T3FREE, THYROIDAB in the last 72 hours. Anemia Panel: No results for input(s): VITAMINB12, FOLATE, FERRITIN, TIBC, IRON, RETICCTPCT in the last 72 hours. Sepsis Labs: Recent Labs  Lab 09/08/18 1530  LATICACIDVEN 0.8    Recent Results (from the past 240 hour(s))   SARS CORONAVIRUS 2 Nasal Swab Aptima Multi Swab     Status: None   Collection Time: 09/06/18  9:16 AM   Specimen: Aptima Multi Swab; Nasal Swab  Result Value Ref Range Status   SARS Coronavirus 2 NEGATIVE NEGATIVE Final    Comment: (NOTE) SARS-CoV-2 target nucleic acids are NOT DETECTED. The SARS-CoV-2 RNA is generally detectable in upper and lower respiratory specimens during the acute phase of infection. Negative results do not preclude SARS-CoV-2 infection, do not rule out co-infections with other pathogens, and should not be used as the sole basis for treatment or other patient management decisions. Negative results must be combined with clinical observations, patient history, and epidemiological information. The expected result is Negative. Fact Sheet for Patients: SugarRoll.be Fact Sheet for Healthcare Providers: https://www.woods-mathews.com/ This test is not yet approved or cleared by the [place] FDA and  has been authorized for detection and/or diagnosis of SARS-CoV-2 by FDA under  an Emergency Use Authorization (EUA). This EUA will remain  in effect (meaning this test can be used) for the duration of the COVID-19 declaration under Section 56 4(b)(1) of the Act, 21 U.S.C. section 360bbb-3(b)(1), unless the authorization is terminated or revoked sooner. Performed at [facility], [place] [address] [place], [place] [address]   Culture, blood (Routine X 2) w Reflex to ID Panel     Status: None (Preliminary result)   Collection Time: 09/08/18  2:40 PM   Specimen: BLOOD  Result Value Ref Range Status   Specimen Description BLOOD BLOOD RIGHT FOREARM  Final   Special Requests   Final    BOTTLES DRAWN AEROBIC ONLY Blood Culture results may not be optimal due to an inadequate volume of blood received in culture bottles   Culture   Final    NO GROWTH 2 DAYS Performed at [facility], [place] [address], [place], [place]  [address]    Report Status PENDING  Incomplete  Culture, blood (Routine X 2) w Reflex to ID Panel     Status: None (Preliminary result)   Collection Time: 09/08/18  3:14 PM   Specimen: BLOOD  Result Value Ref Range Status   Specimen Description BLOOD SITE NOT SPECIFIED  Final   Special Requests   Final    BOTTLES DRAWN AEROBIC AND ANAEROBIC Blood Culture results may not be optimal due to an inadequate volume of blood received in culture bottles   Culture   Final    NO GROWTH 2 DAYS Performed at [facility], [place] [address] [place], [place] [address]    Report Status PENDING  Incomplete  SARS Coronavirus 2 [facility] order, Performed in [facility] hospital lab) Nasopharyngeal Nasopharyngeal Swab     Status: None   Collection Time: 09/08/18  4:10 PM   Specimen: Nasopharyngeal Swab  Result Value Ref Range Status   SARS Coronavirus 2 NEGATIVE NEGATIVE Final    Comment: (NOTE) If result is NEGATIVE SARS-CoV-2 target nucleic acids are NOT DETECTED. The SARS-CoV-2 RNA is generally detectable in upper and lower  respiratory specimens during the acute phase of infection. The lowest  concentration of SARS-CoV-2 viral copies this assay can detect is 250  copies / mL. A negative result does not preclude SARS-CoV-2 infection  and should not be used as the sole basis for treatment or other  patient management decisions.  A negative result may occur with  improper specimen collection / handling, submission of specimen other  than nasopharyngeal swab, presence of viral mutation(s) within the  areas targeted by this assay, and inadequate number of viral copies  (<250 copies / mL). A negative result must be combined with clinical  observations, patient history, and epidemiological information. If result is POSITIVE SARS-CoV-2 target nucleic acids are DETECTED. The SARS-CoV-2 RNA is generally detectable in upper and lower  respiratory specimens dur ing the acute phase of infection.  Positive   results are indicative of active infection with SARS-CoV-2.  Clinical  correlation with patient history and other diagnostic information is  necessary to determine patient infection status.  Positive results do  not rule out bacterial infection or co-infection with other viruses. If result is PRESUMPTIVE POSTIVE SARS-CoV-2 nucleic acids MAY BE PRESENT.   A presumptive positive result was obtained on the submitted specimen  and confirmed on repeat testing.  While 2019 novel coronavirus  (SARS-CoV-2) nucleic acids may be present in the submitted sample  additional confirmatory testing may be necessary  for epidemiological  and / or clinical management purposes  to differentiate between  SARS-CoV-2 and other Sarbecovirus currently known to infect humans.  If clinically indicated additional testing with an alternate test  methodology [phone] is advised. The SARS-CoV-2 RNA is generally  detectable in upper and lower respiratory sp ecimens during the acute  phase of infection. The expected result is Negative. Fact Sheet for Patients:  StrictlyIdeas.no Fact Sheet for Healthcare Providers: BankingDealers.co.za This test is not yet approved or cleared by the [place] FDA and has been authorized for detection and/or diagnosis of SARS-CoV-2 by FDA under an Emergency Use Authorization (EUA).  This EUA will remain in effect (meaning this test can be used) for the duration of the COVID-19 declaration under Section 564(b)(1) of the Act, 21 U.S.C. section 360bbb-3(b)(1), unless the authorization is terminated or revoked sooner. Performed at [facility], [place] [address] [place], [place] [address]   MRSA PCR Screening     Status: Abnormal   Collection Time: 09/09/18 12:44 AM   Specimen: Nasopharyngeal  Result Value Ref Range Status   MRSA by PCR POSITIVE (A) NEGATIVE Final    Comment: CRITICAL RESULT CALLED TO, READ BACK BY AND VERIFIED  WITH: RN Livingston Diones [id number] @[id number]  THANEY Performed at [facility], [place] [address] [place], [place] [address]       Radiology Studies: Ct Head Wo Contrast  Result Date: 09/08/2018 CLINICAL DATA:  Unexplained altered level of consciousness. Week. Lethargic. Dialysis patient. EXAM: CT HEAD WITHOUT CONTRAST TECHNIQUE: Contiguous axial images were obtained from the base of the skull through the vertex without intravenous contrast. COMPARISON:  09/06/2018 FINDINGS: Brain: No change over the last 2 days. Bilateral mixed density subdural hematomas, approximately 1 cm thickness on each side. No evidence of enlargement or additional hyperdense bleeding. Because there is brain atrophy, there is only minimal mass effect. Left-to-right midline shift of 2 mm. No intraparenchymal hemorrhage. No acute parenchymal infarction. Chronic small-vessel ischemic changes affecting the pons, thalami and hemispheric white matter. Vascular: There is atherosclerotic calcification of the major vessels at the base of the brain. Skull: Negative Sinuses/Orbits: Chronic right maxillary sinusitis. Mucosal inflammation of the frontal and ethmoid regions on the right as well. Other: None IMPRESSION: No significant change over the last 2 days. Mixed density bilateral convexity subdural hematomas, proximal thickness 1 cm on each side. No additional hyperdense bleeding. Minimal mass effect with left-to-right shift of 2 mm. Electronically Signed   By: Nelson Chimes M.D.   On: 09/08/2018 17:24   Dg Chest Port 1 View  Result Date: 09/09/2018 CLINICAL DATA:  Shortness of breath, hypoxia EXAM: PORTABLE CHEST 1 VIEW COMPARISON:  09/08/2018 FINDINGS: Right IJ dialysis catheter tips SVC RA junction. Stable cardiomegaly and bibasilar opacities, favored to represent atelectasis over pneumonia. Small effusions suspected. Overall stable aeration. No pneumothorax. Trachea midline. Aorta atherosclerotic. IMPRESSION: Stable cardiomegaly and  bibasilar atelectasis pattern. Atherosclerosis Electronically Signed   By: Jerilynn Mages.  Shick M.D.   On: 09/09/2018 11:45   Dg Chest Port 1 View  Result Date: 09/08/2018 CLINICAL DATA:  Weakness for 3 days. EXAM: PORTABLE CHEST 1 VIEW COMPARISON:  August 22, 2018 FINDINGS: Dual lumen injectable port in stable position. Mildly enlarged cardiac silhouette. Bibasilar atelectasis versus peribronchial airspace consolidation. Osseous structures are without acute abnormality. Soft tissues are grossly normal. IMPRESSION: Bibasilar atelectasis versus peribronchial airspace consolidation. Electronically Signed   By: Fidela Salisbury M.D.   On: 09/08/2018 15:15      Scheduled Meds: .  albuterol  2.5 mg Nebulization Q6H  . calcitRIOL  1.5 mcg Oral Q T,Th,Sa-HD  . Chlorhexidine Gluconate Cloth  6 each Topical Q0600  . cinacalcet  60 mg Oral Q T,Th,Sa-HD  . darbepoetin (ARANESP) injection - DIALYSIS  60 mcg Intravenous Once  . feeding supplement (PRO-STAT SUGAR FREE 64)  30 mL Oral BID  . FLUoxetine  10 mg Oral Daily  . furosemide  80 mg Oral Once per day on Sun Mon Wed Fri  . heparin      . insulin aspart  0-9 Units Subcutaneous TID WC  . insulin detemir  15 Units Subcutaneous Daily  . ipratropium  0.5 mg Nebulization Q6H  . levothyroxine  112 mcg Oral QAC breakfast  . mouth rinse  15 mL Mouth Rinse BID  . methylPREDNISolone (SOLU-MEDROL) injection  40 mg Intravenous Q8H  . midodrine  10 mg Oral 3 times per day on Tue Thu Sat  . montelukast  10 mg Oral QHS  . mupirocin ointment  1 application Nasal BID  . rosuvastatin  40 mg Oral Daily  . senna-docusate  1 tablet Oral QHS  . sevelamer carbonate  2,400 mg Oral TID WC  . sodium chloride flush  3 mL Intravenous Q12H   Continuous Infusions: . sodium chloride    . sodium chloride    . sodium chloride    . azithromycin Stopped (09/09/18 1900)     LOS: 2 days      Time spent: 25 minutes   Dessa Phi, DO [facility] [email or web address]  09/10/2018, 11:57 AM

## 2018-09-10 NOTE — Progress Notes (Signed)
OT Cancellation Note  Pt at HD this AM. OT to continue to check on pt for OT eval at the next available treatment time.  [name] [name] OTR/L Acute Rehabilitation Services Pager: [phone] Office: [phone]

## 2018-09-10 NOTE — Progress Notes (Signed)
PT Cancellation Note  Patient Details Name: Anndrea Stokley MRN: [id number] DOB: [birth year]   Cancelled Treatment:    Reason Eval/Treat Not Completed: Patient at procedure or test/unavailable At HD. Will follow up as time allows.    Lanney Gins, PT, DPT Supplemental Physical Therapist 09/10/18 8:18 AM Pager: [phone] Office: [phone]

## 2018-09-10 NOTE — NC FL2 (Signed)
[place] LEVEL OF CARE SCREENING TOOL     IDENTIFICATION  Patient Name: Joan Mosley Birthdate: April 03, 1945 Sex: female Admission Date (Current Location): 09/08/2018  [facility] and [place] Number:  Herbalist and Address:  [facility] [place], [place] [address] [place], [place] [address]      Provider Number: [id number]  Attending Physician Name and Address:  Dessa Phi, DO  Relative Name and Phone Number:  [facility] & [place], [place] and daughter, [phone]    Current Level of Care: Hospital Recommended Level of Care: [place] Prior Approval Number:    Date Approved/Denied: 09/10/18 PASRR Number: [id number]  Discharge Plan: SNF    Current Diagnoses: Patient Active Problem List   Diagnosis Date Noted  . Pulmonary edema 09/08/2018  . Respiratory failure ([place]) 09/08/2018  . Weakness   . SDH (subdural hematoma) ([place]) 09/06/2018  . Chronic respiratory failure with hypoxia, on home O2 therapy ([place]) 09/06/2018  . Subdural hematoma ([place]) 08/22/2018  . Elevated troponin 08/22/2018  . ESRD (end stage renal disease) ([place]) 08/22/2018  . Anxiety 07/04/2018  . Counseling regarding advanced directives and goals of care 07/01/2018  . Full code status 07/01/2018  . Drug-induced constipation 07/01/2018  . Hemodialysis-associated hypotension 07/01/2018  . Dialysis patient ([place]) 07/01/2018  . HOH (hard of hearing) 07/01/2018  . Stage 5 chronic kidney disease on chronic dialysis ([place]) 07/01/2018  . Type 2 diabetes mellitus with diabetic polyneuropathy, with long-term current use of insulin ([place]) 07/01/2018  . Hypothyroidism 07/01/2018  . Gait instability 07/01/2018  . Hyperkalemia 04/20/2018    Orientation RESPIRATION BLADDER Height & Weight     Self, Time, Situation, Place  O2(90, Non-rebreather mask, 15L) Incontinent, External catheter Weight: 235 lb 7.2 oz (106.8 kg) Height:  5\' 1"  (154.9 cm)  BEHAVIORAL SYMPTOMS/MOOD  NEUROLOGICAL BOWEL NUTRITION STATUS      Continent Diet(carb modified, thin liquids, fluid restriction 1244ml)  AMBULATORY STATUS COMMUNICATION OF NEEDS Skin   Extensive Assist Verbally Bruising, Skin abrasions(bruising on arm/hip; MASD on groin, breast, abdomen; skin tear on leg)                       Personal Care Assistance Level of Assistance  Bathing, Feeding, Dressing Bathing Assistance: Limited assistance Feeding assistance: Limited assistance Dressing Assistance: Limited assistance     Functional Limitations Info  Sight, Hearing, Speech Sight Info: Adequate Hearing Info: Impaired Speech Info: Adequate    SPECIAL CARE FACTORS FREQUENCY  PT (By licensed PT), OT (By licensed OT)     PT Frequency: 5x/wk OT Frequency: 5x/wk            Contractures Contractures Info: Not present    Additional Factors Info  Code Status, Allergies, Insulin Sliding Scale Code Status Info: Full Code Allergies Info: Avelox (Moxifloxacin Hcl In Nacl), Codeine, Other, Penicillins, Sulfa Antibiotics, Shellfish Allergy   Insulin Sliding Scale Info: insulin aspart novolog 0-9 units 3x daily w/meals & insulin determir (levemir) 15 units daily       Current Medications (09/10/2018):  This is the current hospital active medication list Current Facility-Administered Medications  Medication Dose Route Frequency Provider Last Rate Last Dose  . 0.9 %  sodium chloride infusion  100 mL Intravenous PRN Merton Border, MD      . 0.9 %  sodium chloride infusion  100 mL Intravenous PRN Merton Border, MD      . 0.9 %  sodium chloride infusion  250 mL Intravenous  PRN Merton Border, MD      . acetaminophen (TYLENOL) tablet 1,000 mg  1,000 mg Oral Q6H PRN Merton Border, MD   1,000 mg at 09/09/18 0106  . albuterol (PROVENTIL) (2.5 MG/3ML) 0.083% nebulizer solution 2.5 mg  2.5 mg Nebulization Q6H Merton Border, MD   2.5 mg at 09/10/18 0131  . albuterol (PROVENTIL) (2.5 MG/3ML) 0.083% nebulizer solution 2.5 mg  2.5 mg  Nebulization Q2H PRN Merton Border, MD      . azithromycin (ZITHROMAX) 500 mg in sodium chloride 0.9 % 250 mL IVPB  500 mg Intravenous Q24H Dessa Phi, DO   Stopped at 09/09/18 1900  . calcitRIOL (ROCALTROL) capsule 1.5 mcg  1.5 mcg Oral Q T,Th,Sa-HD Madelon Lips, MD   1.5 mcg at 09/10/18 1049  . Chlorhexidine Gluconate Cloth 2 % PADS 6 each  6 each Topical Q0600 Merton Border, MD   6 each at 09/10/18 0557  . cinacalcet (SENSIPAR) tablet 60 mg  60 mg Oral Q T,Th,Sa-HD Madelon Lips, MD   60 mg at 09/10/18 1048  . clonazePAM (KLONOPIN) tablet 1 mg  1 mg Oral BID PRN Merton Border, MD   1 mg at 09/09/18 0106  . Darbepoetin Alfa (ARANESP) injection 60 mcg  60 mcg Intravenous Once Justin Mend, MD      . feeding supplement (PRO-STAT SUGAR FREE 64) liquid 30 mL  30 mL Oral BID Justin Mend, MD      . FLUoxetine (PROZAC) capsule 10 mg  10 mg Oral Daily Merton Border, MD      . furosemide (LASIX) tablet 80 mg  80 mg Oral Once per day on Sun Mon Wed Bettey Costa, MD      . heparin 1000 UNIT/ML injection           . heparin injection 1,000 Units  1,000 Units Intravenous Q dialysis Madelon Lips, MD   3,700 Units at 09/08/18 2329  . insulin aspart (novoLOG) injection 0-9 Units  0-9 Units Subcutaneous TID WC Dessa Phi, DO   1 Units at 09/09/18 1722  . insulin detemir (LEVEMIR) injection 15 Units  15 Units Subcutaneous Daily Merton Border, MD   Stopped at 09/09/18 1327  . ipratropium (ATROVENT) nebulizer solution 0.5 mg  0.5 mg Nebulization Q6H Merton Border, MD   0.5 mg at 09/10/18 0131  . levothyroxine (SYNTHROID) tablet 112 mcg  112 mcg Oral QAC breakfast Merton Border, MD   112 mcg at 09/10/18 0554  . lidocaine (LIDODERM) 5 % 1 patch  1 patch Transdermal Q12H PRN Merton Border, MD      . lidocaine (PF) (XYLOCAINE) 1 % injection 5 mL  5 mL Intradermal PRN Merton Border, MD      . lidocaine-prilocaine (EMLA) cream 1 application  1 application Topical PRN Merton Border, MD      . MEDLINE mouth  rinse  15 mL Mouth Rinse BID Merton Border, MD      . methylPREDNISolone sodium succinate (SOLU-MEDROL) 40 mg/mL injection 40 mg  40 mg Intravenous Q8H Dessa Phi, DO   40 mg at 09/10/18 0555  . midodrine (PROAMATINE) tablet 10 mg  10 mg Oral 3 times per day on Tue Thu Sat Merton Border, MD   10 mg at 09/10/18 0719  . montelukast (SINGULAIR) tablet 10 mg  10 mg Oral QHS Merton Border, MD   10 mg at 09/09/18 2150  . mupirocin ointment (BACTROBAN) 2 % 1 application  1 application Nasal BID Merton Border, MD      .  pentafluoroprop-tetrafluoroeth (GEBAUERS) aerosol 1 application  1 application Topical PRN Merton Border, MD      . rosuvastatin (CRESTOR) tablet 40 mg  40 mg Oral Daily Hijazi, Deatra Canter, MD      . senna-docusate (Senokot-S) tablet 1 tablet  1 tablet Oral QHS Merton Border, MD   1 tablet at 09/09/18 0106  . sevelamer carbonate (RENVELA) tablet 2,400 mg  2,400 mg Oral TID WC Madelon Lips, MD   2,400 mg at 09/09/18 1720  . sodium chloride flush (NS) 0.9 % injection 3 mL  3 mL Intravenous Q12H Merton Border, MD   3 mL at 09/09/18 2150  . sodium chloride flush (NS) 0.9 % injection 3 mL  3 mL Intravenous PRN Merton Border, MD         Discharge Medications: Please see discharge summary for a list of discharge medications.  Relevant Imaging Results:  Relevant Lab Results:   Additional Information SSN: [id number]; Also has hemodialysis on T, TH, S  Tremon Sainvil B Chondra Boyde, LCSWA

## 2018-09-11 ENCOUNTER — Inpatient Hospital Stay (HOSPITAL_COMMUNITY): Payer: Medicare Other

## 2018-09-11 LAB — BASIC METABOLIC PANEL
Anion gap: 15 (ref 5–15)
BUN: 40 mg/dL — ABNORMAL HIGH (ref 8–23)
CO2: 24 mmol/L (ref 22–32)
Calcium: 9.1 mg/dL (ref 8.9–10.3)
Chloride: 96 mmol/L — ABNORMAL LOW (ref 98–111)
Creatinine, Ser: 4.57 mg/dL — ABNORMAL HIGH (ref 0.44–1.00)
GFR calc Af Amer: 10 mL/min — ABNORMAL LOW (ref >60)
GFR calc non Af Amer: 9 mL/min — ABNORMAL LOW (ref >60)
Glucose, Bld: 204 mg/dL — ABNORMAL HIGH (ref 70–99)
Potassium: 4.6 mmol/L (ref 3.5–5.1)
Sodium: 135 mmol/L (ref 135–145)

## 2018-09-11 LAB — GLUCOSE, CAPILLARY
Glucose-Capillary: 193 mg/dL — ABNORMAL HIGH (ref 70–99)
Glucose-Capillary: 262 mg/dL — ABNORMAL HIGH (ref 70–99)
Glucose-Capillary: 250 mg/dL — ABNORMAL HIGH (ref 70–99)
Glucose-Capillary: 303 mg/dL — ABNORMAL HIGH (ref 70–99)

## 2018-09-11 MED ORDER — IOHEXOL 350 MG/ML SOLN
75.0000 mL | Freq: Once | INTRAVENOUS | Status: AC | PRN
  Administered 2018-09-11: 75 mL via INTRAVENOUS

## 2018-09-11 MED ORDER — IPRATROPIUM-ALBUTEROL 0.5-2.5 (3) MG/3ML IN SOLN
3.0000 mL | Freq: Four times a day (QID) | RESPIRATORY_TRACT | Status: DC
  Administered 2018-09-11 (×2): 3 mL via RESPIRATORY_TRACT
  Filled 2018-09-11 (×2): qty 3

## 2018-09-11 MED ORDER — IPRATROPIUM-ALBUTEROL 0.5-2.5 (3) MG/3ML IN SOLN
3.0000 mL | Freq: Three times a day (TID) | RESPIRATORY_TRACT | Status: DC
  Administered 2018-09-12 – 2018-09-16 (×14): 3 mL via RESPIRATORY_TRACT
  Filled 2018-09-11 (×12): qty 3

## 2018-09-11 NOTE — Progress Notes (Signed)
PROGRESS NOTE    Joan Mosley  [id number] DOB: 04-29-1945 DOA: 09/08/2018 PCP: Flossie Buffy, NP     Brief Narrative:  Joan Mosley is a 73 year old female with past medical history significant for ESRD on dialysis Tuesday, Thursday, Saturday, morbid obesity, type 2 diabetes with recent admission for subdural hematoma and discharged home on 8/12 after refusing SNF.  She returns to the hospital with chief complaint of weakness, lethargy, as well as worsening acute on chronic hypoxemic respiratory failure. Rapid response called 8/14 AM, multiple desaturation events.  She was de-satting into the 70s on 6 L nasal cannula, increased to nonrebreather to maintain saturation.  She was started on BiPAP.  New events last 24 hours / Subjective: She was on a nonrebreather mask this morning.  Was trying to wean down, but became hypoxic.  Denied any chest pain on examination.  Assessment & Plan:   Active Problems:   Pulmonary edema   Respiratory failure (HCC)   Acute on chronic hypoxemic respiratory failure -Patient on 5 L nasal cannula O2 at baseline -COVID-19 negative -Chest x-ray 8/13 with bibasilar atelectasis versus peribronchial airspace consolidation.  Personally reviewed, no significant change from x-ray in July 2020 -Unable to wean down back to her baseline levels.  Off of BiPAP today.  Obtain CTA chest to rule out PE or other pulmonary etiology for patient's severe hypoxia  COPD exacerbation -Breathing treatments.  Continue Solu-Medrol today as well as azithromycin for atypical coverage  ESRD on hemodialysis TThSa -Appreciate nephrology  Hypotension -Continue midodrine during dialysis  Diabetes mellitus type 2 -Continue Levemir, sliding scale insulin  Hypothyroidism -Continue Synthroid  Hyperlipidemia -Continue Crestor  Subdural hematoma -Repeat CT head 8/13 without significant change, mixed density bilateral convexity subdural hematoma, proximal thickness 1 cm  on each side.  No additional hyperdense bleeding.  Minimal mass-effect   Demand ischemia -Elevated troponin in setting of ESRD, troponin has been flat 22--> 22  Deconditioning -SNF placement was recommended previous admission, which patient had refused -Agreeable to SNF placement this time.  PT OT and social worker consulted  Depression, anxiety -Continue Prozac, Klonopin          DVT prophylaxis: SCD Code Status: Full code Family Communication: None Disposition Plan: Patient agreeable to SNF placement   Consultants:   Nephrology  Procedures:   None  Antimicrobials:  Anti-infectives (From admission, onward)   Start     Dose/Rate Route Frequency Ordered Stop   09/09/18 1230  azithromycin (ZITHROMAX) 500 mg in sodium chloride 0.9 % 250 mL IVPB     500 mg 250 mL/hr over 60 Minutes Intravenous Every 24 hours 09/09/18 1135         Objective: Vitals:   09/11/18 0900 09/11/18 1000 09/11/18 1100 09/11/18 1127  BP:    (!) 103/56  Pulse: 68 (!) 117 71 69  Resp: 18 (!) 21 (!) 25 11  Temp:    97.6 F (36.4 C)  TempSrc:    Oral  SpO2: 96% 99% 95% 95%  Weight:      Height:        Intake/Output Summary (Last 24 hours) at 09/11/2018 1241 Last data filed at 09/11/2018 0900 Gross per 24 hour  Intake 240 ml  Output 100 ml  Net 140 ml   Filed Weights   09/09/18 0009 09/10/18 0748 09/10/18 1203  Weight: 106.1 kg 110 kg 106.8 kg    Examination: General exam: Appears calm and comfortable  Respiratory system: Clear to auscultation anteriorly.  No accessory muscle  use.  On nonrebreather this morning.Marland Kitchen Respiratory effort normal. Cardiovascular system: S1 & S2 heard, RRR. No JVD, murmurs, rubs, gallops or clicks. No pedal edema. Gastrointestinal system: Abdomen is nondistended, soft and nontender. No organomegaly or masses felt. Normal bowel sounds heard. Central nervous system: Alert and oriented. No focal neurological deficits. Extremities: Symmetric 5 x 5 power. Skin:  No rashes, lesions or ulcers Psychiatry: Judgement and insight appear normal. Mood & affect appropriate.   Data Reviewed: I have personally reviewed following labs and imaging studies  CBC: Recent Labs  Lab 09/06/18 0744 09/07/18 0353 09/08/18 1530 09/08/18 1541 09/09/18 0231 09/10/18 0525 09/10/18 0815  WBC 11.8* 10.1 11.7*  --  10.3 5.6 6.3  NEUTROABS 9.4*  --   --   --   --   --   --   HGB 9.4* 9.2* 8.5* 11.6* 9.6* 8.9* 8.5*  HCT 30.9* 30.0* 28.3* 34.0* 31.3* 28.8* 27.9*  MCV 96.6 97.1 99.6  --  97.5 96.3 97.9  PLT 171 161 151  --  155 148* [id number]   Basic Metabolic Panel: Recent Labs  Lab 09/07/18 0353 09/08/18 1530 09/08/18 1541 09/09/18 0231 09/10/18 0525 09/11/18 0617  NA 135 137 134* 137 133* 135  K 4.8 5.2* 5.1 3.7 5.3* 4.6  CL 95* 93*  --  99 97* 96*  CO2 27 28  --  26 24 24   GLUCOSE 118* 83  --  106* 178* 204*  BUN 32* 51*  --  13 38* 40*  CREATININE 7.69* 10.49*  --  4.15* 6.59* 4.57*  CALCIUM 9.0 8.8*  --  8.8* 9.1 9.1   GFR: Estimated Creatinine Clearance: 12 mL/min (A) (by C-G formula based on SCr of 4.57 mg/dL (H)). Liver Function Tests: Recent Labs  Lab 09/08/18 1530  AST 10*  ALT 11  ALKPHOS 89  BILITOT 0.7  PROT 6.7  ALBUMIN 3.1*   No results for input(s): LIPASE, AMYLASE in the last 168 hours. No results for input(s): AMMONIA in the last 168 hours. Coagulation Profile: Recent Labs  Lab 09/08/18 1530  INR 1.1   Cardiac Enzymes: No results for input(s): CKTOTAL, CKMB, CKMBINDEX, TROPONINI in the last 168 hours. BNP (last 3 results) No results for input(s): PROBNP in the last 8760 hours. HbA1C: No results for input(s): HGBA1C in the last 72 hours. CBG: Recent Labs  Lab 09/10/18 1255 09/10/18 1710 09/10/18 2125 09/11/18 0818 09/11/18 1125  GLUCAP 147* 176* 236* 193* 262*   Lipid Profile: No results for input(s): CHOL, HDL, LDLCALC, TRIG, CHOLHDL, LDLDIRECT in the last 72 hours. Thyroid Function Tests: No results for input(s):  TSH, T4TOTAL, FREET4, T3FREE, THYROIDAB in the last 72 hours. Anemia Panel: No results for input(s): VITAMINB12, FOLATE, FERRITIN, TIBC, IRON, RETICCTPCT in the last 72 hours. Sepsis Labs: Recent Labs  Lab 09/08/18 1530  LATICACIDVEN 0.8    Recent Results (from the past 240 hour(s))  SARS CORONAVIRUS 2 Nasal Swab Aptima Multi Swab     Status: None   Collection Time: 09/06/18  9:16 AM   Specimen: Aptima Multi Swab; Nasal Swab  Result Value Ref Range Status   SARS Coronavirus 2 NEGATIVE NEGATIVE Final    Comment: (NOTE) SARS-CoV-2 target nucleic acids are NOT DETECTED. The SARS-CoV-2 RNA is generally detectable in upper and lower respiratory specimens during the acute phase of infection. Negative results do not preclude SARS-CoV-2 infection, do not rule out co-infections with other pathogens, and should not be used as the sole basis for treatment or other  patient management decisions. Negative results must be combined with clinical observations, patient history, and epidemiological information. The expected result is Negative. Fact Sheet for Patients: SugarRoll.be Fact Sheet for Healthcare Providers: https://www.woods-mathews.com/ This test is not yet approved or cleared by the [place] FDA and  has been authorized for detection and/or diagnosis of SARS-CoV-2 by FDA under an Emergency Use Authorization (EUA). This EUA will remain  in effect (meaning this test can be used) for the duration of the COVID-19 declaration under Section 56 4(b)(1) of the Act, 21 U.S.C. section 360bbb-3(b)(1), unless the authorization is terminated or revoked sooner. Performed at [facility], [place] [address], [place], [place] [address]   Culture, blood (Routine X 2) w Reflex to ID Panel     Status: None (Preliminary result)   Collection Time: 09/08/18  2:40 PM   Specimen: BLOOD RIGHT FOREARM  Result Value Ref Range Status   Specimen Description  BLOOD RIGHT FOREARM  Final   Special Requests   Final    BOTTLES DRAWN AEROBIC ONLY Blood Culture results may not be optimal due to an inadequate volume of blood received in culture bottles   Culture   Final    NO GROWTH 3 DAYS Performed at [facility], [place] [address], [place], [place] [address]    Report Status PENDING  Incomplete  Culture, blood (Routine X 2) w Reflex to ID Panel     Status: None (Preliminary result)   Collection Time: 09/08/18  3:14 PM   Specimen: BLOOD  Result Value Ref Range Status   Specimen Description BLOOD SITE NOT SPECIFIED  Final   Special Requests   Final    BOTTLES DRAWN AEROBIC AND ANAEROBIC Blood Culture results may not be optimal due to an inadequate volume of blood received in culture bottles   Culture   Final    NO GROWTH 3 DAYS Performed at [facility], [place] [address], [place], [place] [address]    Report Status PENDING  Incomplete  SARS Coronavirus 2 [facility] order, Performed in [facility] hospital lab) Nasopharyngeal Nasopharyngeal Swab     Status: None   Collection Time: 09/08/18  4:10 PM   Specimen: Nasopharyngeal Swab  Result Value Ref Range Status   SARS Coronavirus 2 NEGATIVE NEGATIVE Final    Comment: (NOTE) If result is NEGATIVE SARS-CoV-2 target nucleic acids are NOT DETECTED. The SARS-CoV-2 RNA is generally detectable in upper and lower  respiratory specimens during the acute phase of infection. The lowest  concentration of SARS-CoV-2 viral copies this assay can detect is 250  copies / mL. A negative result does not preclude SARS-CoV-2 infection  and should not be used as the sole basis for treatment or other  patient management decisions.  A negative result may occur with  improper specimen collection / handling, submission of specimen other  than nasopharyngeal swab, presence of viral mutation(s) within the  areas targeted by this assay, and inadequate number of viral copies  (<250 copies / mL). A negative result  must be combined with clinical  observations, patient history, and epidemiological information. If result is POSITIVE SARS-CoV-2 target nucleic acids are DETECTED. The SARS-CoV-2 RNA is generally detectable in upper and lower  respiratory specimens dur ing the acute phase of infection.  Positive  results are indicative of active infection with SARS-CoV-2.  Clinical  correlation with patient history and other diagnostic information is  necessary to determine patient infection status.  Positive results do  not rule  out bacterial infection or co-infection with other viruses. If result is PRESUMPTIVE POSTIVE SARS-CoV-2 nucleic acids MAY BE PRESENT.   A presumptive positive result was obtained on the submitted specimen  and confirmed on repeat testing.  While 2019 novel coronavirus  (SARS-CoV-2) nucleic acids may be present in the submitted sample  additional confirmatory testing may be necessary for epidemiological  and / or clinical management purposes  to differentiate between  SARS-CoV-2 and other Sarbecovirus currently known to infect humans.  If clinically indicated additional testing with an alternate test  methodology [phone]) is advised. The SARS-CoV-2 RNA is generally  detectable in upper and lower respiratory sp ecimens during the acute  phase of infection. The expected result is Negative. Fact Sheet for Patients:  StrictlyIdeas.no Fact Sheet for Healthcare Providers: BankingDealers.co.za This test is not yet approved or cleared by the [place] FDA and has been authorized for detection and/or diagnosis of SARS-CoV-2 by FDA under an Emergency Use Authorization (EUA).  This EUA will remain in effect (meaning this test can be used) for the duration of the COVID-19 declaration under Section 564(b)(1) of the Act, 21 U.S.C. section 360bbb-3(b)(1), unless the authorization is terminated or revoked sooner. Performed at [facility], [place] [address], [place], [place] [address]   MRSA PCR Screening     Status: Abnormal   Collection Time: 09/09/18 12:44 AM   Specimen: Nasopharyngeal  Result Value Ref Range Status   MRSA by PCR POSITIVE (A) NEGATIVE Final    Comment: CRITICAL RESULT CALLED TO, READ BACK BY AND VERIFIED WITH: RN Livingston Diones RN:[id number] @[id number]  THANEY Performed at [facility], [place] [address], [place], [place] [address]       Radiology Studies: No results found.    Scheduled Meds:  calcitRIOL  1.5 mcg Oral Q T,Th,Sa-HD   Chlorhexidine Gluconate Cloth  6 each Topical Q0600   cinacalcet  60 mg Oral Q T,Th,Sa-HD   darbepoetin (ARANESP) injection - DIALYSIS  60 mcg Intravenous Once   feeding supplement (PRO-STAT SUGAR FREE 64)  30 mL Oral BID   FLUoxetine  10 mg Oral Daily   furosemide  80 mg Oral Once per day on Sun Mon Wed Fri   insulin aspart  0-9 Units Subcutaneous TID WC   insulin detemir  15 Units Subcutaneous Daily   ipratropium-albuterol  3 mL Nebulization Q6H   levothyroxine  112 mcg Oral QAC breakfast   mouth rinse  15 mL Mouth Rinse BID   methylPREDNISolone (SOLU-MEDROL) injection  40 mg Intravenous Q8H   midodrine  10 mg Oral 3 times per day on Tue Thu Sat   montelukast  10 mg Oral QHS   mupirocin ointment  1 application Nasal BID   rosuvastatin  40 mg Oral Daily   senna-docusate  1 tablet Oral QHS   sevelamer carbonate  2,400 mg Oral TID WC   sodium chloride flush  3 mL Intravenous Q12H   Continuous Infusions:  sodium chloride     sodium chloride     sodium chloride     azithromycin Stopped (09/09/18 1900)     LOS: 3 days      Time spent: 25 minutes   Dessa Phi, DO [facility] [email or web address] 09/11/2018, 12:41 PM

## 2018-09-11 NOTE — Progress Notes (Signed)
[facility] Progress Note   Subjective:   No issues with HD yesterday. Tol 3.6L UF.  Trying to wean to [place] O2 this am. Sats 99% on 55% NRB but drop when she's taking it off to eat.   Objective Vitals:   09/11/18 0600 09/11/18 0752 09/11/18 0803 09/11/18 0813  BP: 116/77   106/74  Pulse: 69   67  Resp:    19  Temp: 97.9 F (36.6 C)   98 F (36.7 C)  TempSrc: Axillary   Oral  SpO2:  99% 94% 91%  Weight:      Height:       Physical Exam General: chronically ill but nontoxic appearing on NRB Heart: RRR, no rub Lungs: normal WOB on NRB, no rales or wheezes appreciated Abdomen: soft Extremities: feet wrinkled and w/o edema now;  ruddy discoloration of pretibial skin c/w prior Dialysis Access:  [facility] c/d/i  Additional Objective Labs: Basic Metabolic Panel: Recent Labs  Lab 09/09/18 0231 09/10/18 0525 09/11/18 0617  NA 137 133* 135  K 3.7 5.3* 4.6  CL 99 97* 96*  CO2 26 24 24   GLUCOSE 106* 178* 204*  BUN 13 38* 40*  CREATININE 4.15* 6.59* 4.57*  CALCIUM 8.8* 9.1 9.1   Liver Function Tests: Recent Labs  Lab 09/08/18 1530  AST 10*  ALT 11  ALKPHOS 89  BILITOT 0.7  PROT 6.7  ALBUMIN 3.1*   No results for input(s): LIPASE, AMYLASE in the last 168 hours. CBC: Recent Labs  Lab 09/06/18 0744 09/07/18 0353 09/08/18 1530  09/09/18 0231 09/10/18 0525 09/10/18 0815  WBC 11.8* 10.1 11.7*  --  10.3 5.6 6.3  NEUTROABS 9.4*  --   --   --   --   --   --   HGB 9.4* 9.2* 8.5*   < > 9.6* 8.9* 8.5*  HCT 30.9* 30.0* 28.3*   < > 31.3* 28.8* 27.9*  MCV 96.6 97.1 99.6  --  97.5 96.3 97.9  PLT 171 161 151  --  155 148* 169   < > = values in this interval not displayed.   Blood Culture    Component Value Date/Time   SDES BLOOD SITE NOT SPECIFIED 09/08/2018 1514   SPECREQUEST  09/08/2018 1514    BOTTLES DRAWN AEROBIC AND ANAEROBIC Blood Culture results may not be optimal due to an inadequate volume of blood received in culture bottles   CULT  09/08/2018 1514     NO GROWTH 2 DAYS Performed at [facility], [place] [address], [place],  [address]    REPTSTATUS PENDING 09/08/2018 1514    Cardiac Enzymes: No results for input(s): CKTOTAL, CKMB, CKMBINDEX, TROPONINI in the last 168 hours. CBG: Recent Labs  Lab 09/09/18 2041 09/10/18 1255 09/10/18 1710 09/10/18 2125 09/11/18 0818  GLUCAP 162* 147* 176* 236* 193*   Iron Studies: No results for input(s): IRON, TIBC, TRANSFERRIN, FERRITIN in the last 72 hours. @lablastinr3 @ Studies/Results: Dg Chest Port 1 View  Result Date: 09/09/2018 CLINICAL DATA:  Shortness of breath, hypoxia EXAM: PORTABLE CHEST 1 VIEW COMPARISON:  09/08/2018 FINDINGS: Right IJ dialysis catheter tips SVC RA junction. Stable cardiomegaly and bibasilar opacities, favored to represent atelectasis over pneumonia. Small effusions suspected. Overall stable aeration. No pneumothorax. Trachea midline. Aorta atherosclerotic. IMPRESSION: Stable cardiomegaly and bibasilar atelectasis pattern. Atherosclerosis Electronically Signed   By: Jerilynn Mages.  Shick M.D.   On: 09/09/2018 11:45   Medications: . sodium chloride    . sodium chloride    .  sodium chloride    . azithromycin Stopped (09/09/18 1900)   . calcitRIOL  1.5 mcg Oral Q T,Th,Sa-HD  . Chlorhexidine Gluconate Cloth  6 each Topical Q0600  . cinacalcet  60 mg Oral Q T,Th,Sa-HD  . darbepoetin (ARANESP) injection - DIALYSIS  60 mcg Intravenous Once  . feeding supplement (PRO-STAT SUGAR FREE 64)  30 mL Oral BID  . FLUoxetine  10 mg Oral Daily  . furosemide  80 mg Oral Once per day on Sun Mon Wed Fri  . insulin aspart  0-9 Units Subcutaneous TID WC  . insulin detemir  15 Units Subcutaneous Daily  . ipratropium-albuterol  3 mL Nebulization Q6H  . levothyroxine  112 mcg Oral QAC breakfast  . mouth rinse  15 mL Mouth Rinse BID  . methylPREDNISolone (SOLU-MEDROL) injection  40 mg Intravenous Q8H  . midodrine  10 mg Oral 3 times per day on Tue Thu Sat  . montelukast  10 mg Oral  QHS  . mupirocin ointment  1 application Nasal BID  . rosuvastatin  40 mg Oral Daily  . senna-docusate  1 tablet Oral QHS  . sevelamer carbonate  2,400 mg Oral TID WC  . sodium chloride flush  3 mL Intravenous Q12H    [facility] T: 4 hr; 180NRe; DFR 400/DFR auto 1.5; EDW 105.5kg, 2K/2Ca No heparin bolus Calcitriol 1.5 mcg PO qHD Sensipar 60 mg PO qHD renvela 800mg  3 tabs AC Midodrine 10 mg PO TIW before HD and mid treatment for SBP <95  Assessment/Plan: 1. Weakness, repeated falls: known SDH, debility.  Agreeable to SNF now.   2.  Hypoxia, baseline COPD:  Chronic O2 requirement of 5L.  X ray without pulmonary edema.  Primary treating for COPD.  Wean O2 today.   2. ESRD: TTS.  HD yesterday per schedule.  UF 3.6L.  No heparin with SDH. Next planned HD tues.   3. HTN/volume:  Midodrine pre HD.    4. Anemia: Hb 8.9, started ESA 8/15   5. Secondary hyperparathyroidism:  gets calcitriol/ sensipar in HD, renvela as binder  6. Nutrition:  Albumin 3.1. prostat BID here.   7.  Dispo: agreeable to SNF.  Await placement.  Ok to d/c once plans in place.   I spoke with her Daughter [name] this morning via telephone just to let her know updates.   Jannifer Hick MD 09/11/2018, 9:31 AM  [facility] Pager: [phone]

## 2018-09-11 NOTE — TOC Progression Note (Signed)
Transition of Care [facility]) - Progression Note    Patient Details  Name: Joan Mosley MRN: [id number] Date of Birth: Apr 20, 1945  Transition of Care [facility]) CM/SW [place], [place] Phone Number: 09/11/2018, 12:18 PM  Clinical Narrative:     CSW met with the patient and son at bedside. CSW introduced herself and explained her role. CSW provided bed offers to the patient. The patient's son, [name] wants his mother to go to rehab. The patient agreed. [name] also asked about home health. CSW provided home health information. He stated that he would not be able to assist her at his home by hisself. He asked if the CSW could come back tomorrow and they will give their decision.   CSW stated she would follow up on Monday. They chose [facility] if the patient ends up going to SNF. She will have to be placed on 5L or less in order to discharge to a SNF.   CSW will continue to follow.   Expected Discharge Plan: [place] Barriers to Discharge: Continued Medical Work up  Expected Discharge Plan and Services Expected Discharge Plan: [place] In-house Referral: Clinical Social Work Discharge Planning Services: NA Post Acute Care Choice: [place] Living arrangements for the past 2 months: Single Family Home                 DME Arranged: N/A DME Agency: NA       HH Arranged: NA HH Agency: NA         Social Determinants of Health (SDOH) Interventions    Readmission Risk Interventions No flowsheet data found.

## 2018-09-11 NOTE — Plan of Care (Signed)
  Problem: Education: Goal: Knowledge of General Education information will improve Description: Including pain rating scale, medication(s)/side effects and non-pharmacologic comfort measures Outcome: Progressing   Problem: Clinical Measurements: Goal: Respiratory complications will improve Outcome: Progressing   

## 2018-09-11 NOTE — Progress Notes (Signed)
Spoke with K Eubanks, NP regarding MEWS vital signs policy and notification when MEWS score shows yellow or red. Per NP we are to notify her only if the patient is unstable or there is a significant change. 

## 2018-09-12 LAB — BASIC METABOLIC PANEL
Anion gap: 16 — ABNORMAL HIGH (ref 5–15)
BUN: 70 mg/dL — ABNORMAL HIGH (ref 8–23)
CO2: 22 mmol/L (ref 22–32)
Calcium: 8.6 mg/dL — ABNORMAL LOW (ref 8.9–10.3)
Chloride: 93 mmol/L — ABNORMAL LOW (ref 98–111)
Creatinine, Ser: 6.63 mg/dL — ABNORMAL HIGH (ref 0.44–1.00)
GFR calc Af Amer: 6 mL/min — ABNORMAL LOW (ref >60)
GFR calc non Af Amer: 6 mL/min — ABNORMAL LOW (ref >60)
Glucose, Bld: 200 mg/dL — ABNORMAL HIGH (ref 70–99)
Potassium: 5.1 mmol/L (ref 3.5–5.1)
Sodium: 131 mmol/L — ABNORMAL LOW (ref 135–145)

## 2018-09-12 LAB — GLUCOSE, CAPILLARY
Glucose-Capillary: 195 mg/dL — ABNORMAL HIGH (ref 70–99)
Glucose-Capillary: 255 mg/dL — ABNORMAL HIGH (ref 70–99)
Glucose-Capillary: 219 mg/dL — ABNORMAL HIGH (ref 70–99)
Glucose-Capillary: 167 mg/dL — ABNORMAL HIGH (ref 70–99)

## 2018-09-12 MED ORDER — INSULIN ASPART 100 UNIT/ML ~~LOC~~ SOLN
3.0000 [IU] | Freq: Three times a day (TID) | SUBCUTANEOUS | Status: DC
  Administered 2018-09-12 – 2018-09-16 (×10): 3 [IU] via SUBCUTANEOUS

## 2018-09-12 MED ORDER — MOMETASONE FURO-FORMOTEROL FUM 100-5 MCG/ACT IN AERO
2.0000 | INHALATION_SPRAY | Freq: Two times a day (BID) | RESPIRATORY_TRACT | Status: DC
  Administered 2018-09-12 – 2018-09-16 (×8): 2 via RESPIRATORY_TRACT
  Filled 2018-09-12: qty 8.8

## 2018-09-12 MED ORDER — INSULIN DETEMIR 100 UNIT/ML ~~LOC~~ SOLN
17.0000 [IU] | Freq: Every day | SUBCUTANEOUS | Status: DC
  Administered 2018-09-13 – 2018-09-16 (×4): 17 [IU] via SUBCUTANEOUS
  Filled 2018-09-12 (×4): qty 0.17

## 2018-09-12 MED ORDER — DOCUSATE SODIUM 100 MG PO CAPS
200.0000 mg | ORAL_CAPSULE | Freq: Every day | ORAL | Status: DC
  Administered 2018-09-12 – 2018-09-15 (×4): 200 mg via ORAL
  Filled 2018-09-12 (×4): qty 2

## 2018-09-12 NOTE — Progress Notes (Signed)
PROGRESS NOTE    Joan Mosley  [id number] DOB: February 17, 1945 DOA: 09/08/2018 PCP: Flossie Buffy, NP     Brief Narrative:  Joan Mosley is a 73 year old female with past medical history significant for ESRD on dialysis Tuesday, Thursday, Saturday, morbid obesity, type 2 diabetes with recent admission for subdural hematoma and discharged home on 8/12 after refusing SNF.  She returns to the hospital with chief complaint of weakness, lethargy, as well as worsening acute on chronic hypoxemic respiratory failure. Rapid response called 8/14 AM, multiple desaturation events.  She was de-satting into the 70s on 6 L nasal cannula, increased to nonrebreather to maintain saturation.  She was started on BiPAP, weaned down to nonrebreather and high flow nasal cannula O2.  CTA completed which ruled out for PE.  New events last 24 hours / Subjective: Resting comfortably, on 10 L high flow nasal cannula this morning.  Complains of productive cough today  Assessment & Plan:   Active Problems:   Pulmonary edema   Respiratory failure (HCC)   Acute on chronic hypoxemic respiratory failure -Patient on 5 L nasal cannula O2 at baseline -COVID-19 negative -Chest x-ray 8/13 with bibasilar atelectasis versus peribronchial airspace consolidation.  Personally reviewed, no significant change from x-ray in July 2020 -Unable to wean down back to her baseline levels.  Off of BiPAP today.  CTA chest negative for PE.  Small bibasilar consolidation right greater than left, likely chronic atelectasis. -On 10 L high flow nasal cannula O2 today.  Continue to wean as able to baseline of 5 L.  COPD exacerbation -Breathing treatments.  Continue Solu-Medrol today as well as azithromycin for atypical coverage  ESRD on hemodialysis TThSa -Appreciate nephrology  Hypotension -Continue midodrine during dialysis  Diabetes mellitus type 2 -Continue Levemir, sliding scale insulin  Hypothyroidism -Continue  Synthroid  Hyperlipidemia -Continue Crestor  Subdural hematoma -Repeat CT head 8/13 without significant change, mixed density bilateral convexity subdural hematoma, proximal thickness 1 cm on each side.  No additional hyperdense bleeding.  Minimal mass-effect   Demand ischemia -Elevated troponin in setting of ESRD, troponin has been flat 22--> 22  Deconditioning -SNF placement was recommended previous admission, which patient had refused -Agreeable to SNF placement this time.  PT OT and social worker consulted  Depression, anxiety -Continue Prozac, Klonopin          DVT prophylaxis: SCD Code Status: Full code Family Communication: None Disposition Plan: Patient agreeable to SNF placement   Consultants:   Nephrology  Procedures:   None  Antimicrobials:  Anti-infectives (From admission, onward)   Start     Dose/Rate Route Frequency Ordered Stop   09/09/18 1230  azithromycin (ZITHROMAX) 500 mg in sodium chloride 0.9 % 250 mL IVPB     500 mg 250 mL/hr over 60 Minutes Intravenous Every 24 hours 09/09/18 1135 09/14/18 1229       Objective: Vitals:   09/12/18 0500 09/12/18 0813 09/12/18 0821 09/12/18 1212  BP: 114/81  122/64   Pulse:   74   Resp:   20   Temp: (!) 97.4 F (36.3 C)  97.8 F (36.6 C) 97.6 F (36.4 C)  TempSrc: Axillary  Oral Oral  SpO2:  93% 92%   Weight:      Height:        Intake/Output Summary (Last 24 hours) at 09/12/2018 1310 Last data filed at 09/12/2018 0800 Gross per 24 hour  Intake 360 ml  Output --  Net 360 ml   Filed Weights   09/09/18  0009 09/10/18 0748 09/10/18 1203  Weight: 106.1 kg 110 kg 106.8 kg    Examination: General exam: Appears calm  Respiratory system: Clear to auscultation anteriorly.  Getting breathing treatment at time of my examination.  Does not appear to be in any respiratory distress Cardiovascular system: S1 & S2 heard, RRR. No JVD, murmurs, rubs, gallops or clicks. No pedal edema. Gastrointestinal  system: Abdomen is nondistended, soft and nontender. No organomegaly or masses felt. Normal bowel sounds heard. Central nervous system: Alert and oriented. No focal neurological deficits. Extremities: Symmetric 5 x 5 power. Skin: No rashes, lesions or ulcers Psychiatry: Judgement and insight appear normal. Mood & affect appropriate.   Data Reviewed: I have personally reviewed following labs and imaging studies  CBC: Recent Labs  Lab 09/06/18 0744 09/07/18 0353 09/08/18 1530 09/08/18 1541 09/09/18 0231 09/10/18 0525 09/10/18 0815  WBC 11.8* 10.1 11.7*  --  10.3 5.6 6.3  NEUTROABS 9.4*  --   --   --   --   --   --   HGB 9.4* 9.2* 8.5* 11.6* 9.6* 8.9* 8.5*  HCT 30.9* 30.0* 28.3* 34.0* 31.3* 28.8* 27.9*  MCV 96.6 97.1 99.6  --  97.5 96.3 97.9  PLT 171 161 151  --  155 148* [id number]   Basic Metabolic Panel: Recent Labs  Lab 09/08/18 1530 09/08/18 1541 09/09/18 0231 09/10/18 0525 09/11/18 0617 09/12/18 0714  NA 137 134* 137 133* 135 131*  K 5.2* 5.1 3.7 5.3* 4.6 5.1  CL 93*  --  99 97* 96* 93*  CO2 28  --  26 24 24 22   GLUCOSE 83  --  106* 178* 204* 200*  BUN 51*  --  13 38* 40* 70*  CREATININE 10.49*  --  4.15* 6.59* 4.57* 6.63*  CALCIUM 8.8*  --  8.8* 9.1 9.1 8.6*   GFR: Estimated Creatinine Clearance: 8.3 mL/min (A) (by C-G formula based on SCr of 6.63 mg/dL (H)). Liver Function Tests: Recent Labs  Lab 09/08/18 1530  AST 10*  ALT 11  ALKPHOS 89  BILITOT 0.7  PROT 6.7  ALBUMIN 3.1*   No results for input(s): LIPASE, AMYLASE in the last 168 hours. No results for input(s): AMMONIA in the last 168 hours. Coagulation Profile: Recent Labs  Lab 09/08/18 1530  INR 1.1   Cardiac Enzymes: No results for input(s): CKTOTAL, CKMB, CKMBINDEX, TROPONINI in the last 168 hours. BNP (last 3 results) No results for input(s): PROBNP in the last 8760 hours. HbA1C: No results for input(s): HGBA1C in the last 72 hours. CBG: Recent Labs  Lab 09/11/18 1125 09/11/18 1530  09/11/18 2114 09/12/18 0825 09/12/18 1209  GLUCAP 262* 250* 303* 195* 255*   Lipid Profile: No results for input(s): CHOL, HDL, LDLCALC, TRIG, CHOLHDL, LDLDIRECT in the last 72 hours. Thyroid Function Tests: No results for input(s): TSH, T4TOTAL, FREET4, T3FREE, THYROIDAB in the last 72 hours. Anemia Panel: No results for input(s): VITAMINB12, FOLATE, FERRITIN, TIBC, IRON, RETICCTPCT in the last 72 hours. Sepsis Labs: Recent Labs  Lab 09/08/18 1530  LATICACIDVEN 0.8    Recent Results (from the past 240 hour(s))  SARS CORONAVIRUS 2 Nasal Swab Aptima Multi Swab     Status: None   Collection Time: 09/06/18  9:16 AM   Specimen: Aptima Multi Swab; Nasal Swab  Result Value Ref Range Status   SARS Coronavirus 2 NEGATIVE NEGATIVE Final    Comment: (NOTE) SARS-CoV-2 target nucleic acids are NOT DETECTED. The SARS-CoV-2 RNA is generally detectable in upper  and lower respiratory specimens during the acute phase of infection. Negative results do not preclude SARS-CoV-2 infection, do not rule out co-infections with other pathogens, and should not be used as the sole basis for treatment or other patient management decisions. Negative results must be combined with clinical observations, patient history, and epidemiological information. The expected result is Negative. Fact Sheet for Patients: SugarRoll.be Fact Sheet for Healthcare Providers: https://www.woods-mathews.com/ This test is not yet approved or cleared by the [place] FDA and  has been authorized for detection and/or diagnosis of SARS-CoV-2 by FDA under an Emergency Use Authorization (EUA). This EUA will remain  in effect (meaning this test can be used) for the duration of the COVID-19 declaration under Section 56 4(b)(1) of the Act, 21 U.S.C. section 360bbb-3(b)(1), unless the authorization is terminated or revoked sooner. Performed at [facility], [place] [address]  [place], [place] [address]   Culture, blood (Routine X 2) w Reflex to ID Panel     Status: None (Preliminary result)   Collection Time: 09/08/18  2:40 PM   Specimen: BLOOD RIGHT FOREARM  Result Value Ref Range Status   Specimen Description BLOOD RIGHT FOREARM  Final   Special Requests   Final    BOTTLES DRAWN AEROBIC ONLY Blood Culture results may not be optimal due to an inadequate volume of blood received in culture bottles   Culture   Final    NO GROWTH 4 DAYS Performed at [facility], [place] [address] [place], [place] [address]    Report Status PENDING  Incomplete  Culture, blood (Routine X 2) w Reflex to ID Panel     Status: None (Preliminary result)   Collection Time: 09/08/18  3:14 PM   Specimen: BLOOD  Result Value Ref Range Status   Specimen Description BLOOD SITE NOT SPECIFIED  Final   Special Requests   Final    BOTTLES DRAWN AEROBIC AND ANAEROBIC Blood Culture results may not be optimal due to an inadequate volume of blood received in culture bottles   Culture   Final    NO GROWTH 4 DAYS Performed at [facility], [place] [address] [place], [place] [address]    Report Status PENDING  Incomplete  SARS Coronavirus 2 [facility] order, Performed in [facility] hospital lab) Nasopharyngeal Nasopharyngeal Swab     Status: None   Collection Time: 09/08/18  4:10 PM   Specimen: Nasopharyngeal Swab  Result Value Ref Range Status   SARS Coronavirus 2 NEGATIVE NEGATIVE Final    Comment: (NOTE) If result is NEGATIVE SARS-CoV-2 target nucleic acids are NOT DETECTED. The SARS-CoV-2 RNA is generally detectable in upper and lower  respiratory specimens during the acute phase of infection. The lowest  concentration of SARS-CoV-2 viral copies this assay can detect is 250  copies / mL. A negative result does not preclude SARS-CoV-2 infection  and should not be used as the sole basis for treatment or other  patient management decisions.  A negative result may occur with  improper  specimen collection / handling, submission of specimen other  than nasopharyngeal swab, presence of viral mutation(s) within the  areas targeted by this assay, and inadequate number of viral copies  (<250 copies / mL). A negative result must be combined with clinical  observations, patient history, and epidemiological information. If result is POSITIVE SARS-CoV-2 target nucleic acids are DETECTED. The SARS-CoV-2 RNA is generally detectable in upper and lower  respiratory specimens dur ing the acute phase of  infection.  Positive  results are indicative of active infection with SARS-CoV-2.  Clinical  correlation with patient history and other diagnostic information is  necessary to determine patient infection status.  Positive results do  not rule out bacterial infection or co-infection with other viruses. If result is PRESUMPTIVE POSTIVE SARS-CoV-2 nucleic acids MAY BE PRESENT.   A presumptive positive result was obtained on the submitted specimen  and confirmed on repeat testing.  While 2019 novel coronavirus  (SARS-CoV-2) nucleic acids may be present in the submitted sample  additional confirmatory testing may be necessary for epidemiological  and / or clinical management purposes  to differentiate between  SARS-CoV-2 and other Sarbecovirus currently known to infect humans.  If clinically indicated additional testing with an alternate test  methodology [phone]) is advised. The SARS-CoV-2 RNA is generally  detectable in upper and lower respiratory sp ecimens during the acute  phase of infection. The expected result is Negative. Fact Sheet for Patients:  StrictlyIdeas.no Fact Sheet for Healthcare Providers: BankingDealers.co.za This test is not yet approved or cleared by the [place] FDA and has been authorized for detection and/or diagnosis of SARS-CoV-2 by FDA under an Emergency Use Authorization (EUA).  This EUA will remain in  effect (meaning this test can be used) for the duration of the COVID-19 declaration under Section 564(b)(1) of the Act, 21 U.S.C. section 360bbb-3(b)(1), unless the authorization is terminated or revoked sooner. Performed at [facility], [place] [address] [place], [place] [address]   MRSA PCR Screening     Status: Abnormal   Collection Time: 09/09/18 12:44 AM   Specimen: Nasopharyngeal  Result Value Ref Range Status   MRSA by PCR POSITIVE (A) NEGATIVE Final    Comment: CRITICAL RESULT CALLED TO, READ BACK BY AND VERIFIED WITH: RN Livingston Diones RN:[id number] @[id number]  THANEY Performed at [facility], [address] [place], [place] [address]       Radiology Studies: Ct Angio Chest Pe W Or Wo Contrast  Result Date: 09/11/2018 CLINICAL DATA:  PE suspected, high pretest probability. EXAM: CT ANGIOGRAPHY CHEST WITH CONTRAST TECHNIQUE: Multidetector CT imaging of the chest was performed using the standard protocol during bolus administration of intravenous contrast. Multiplanar CT image reconstructions and MIPs were obtained to evaluate the vascular anatomy. CONTRAST:  30mL OMNIPAQUE IOHEXOL 350 MG/ML SOLN COMPARISON:  None. FINDINGS: Cardiovascular: There is no pulmonary embolism identified within the main, lobar or segmental pulmonary arteries bilaterally. Main pulmonary arteries are prominent indicating chronic pulmonary artery hypertension. No thoracic aortic aneurysm. Aortic atherosclerosis. No pericardial effusion. Cardiomegaly. Diffuse coronary artery calcifications. Mediastinum/Nodes: No mass or enlarged lymph nodes seen within the mediastinum or perihilar regions. Esophagus is unremarkable. Trachea and central bronchi are unremarkable. Lungs/Pleura: Small bibasilar consolidations, RIGHT greater than LEFT, likely chronic atelectasis. No pleural effusion or pneumothorax seen. Mild emphysematous changes at the lung apices. Associated scarring/fibrosis within the upper lungs. Upper Abdomen:  Limited images of the upper abdomen are unremarkable. Musculoskeletal: Degenerative spondylosis throughout the slightly scoliotic thoracolumbar spine, mild to moderate in degree. No acute appearing osseous abnormality. Review of the MIP images confirms the above findings. IMPRESSION: 1. No pulmonary embolism seen. Chronic pulmonary artery hypertension. 2. Small bibasilar consolidations, RIGHT greater than LEFT, likely chronic atelectasis. 3. Cardiomegaly. Diffuse coronary artery calcifications. Aortic Atherosclerosis (ICD10-I70.0) and Emphysema (ICD10-J43.9). Electronically Signed   By: Franki Cabot M.D.   On: 09/11/2018 13:14      Scheduled Meds:  calcitRIOL  1.5 mcg Oral Q T,Th,Sa-HD  Chlorhexidine Gluconate Cloth  6 each Topical Q0600   cinacalcet  60 mg Oral Q T,Th,Sa-HD   darbepoetin (ARANESP) injection - DIALYSIS  60 mcg Intravenous Once   feeding supplement (PRO-STAT SUGAR FREE 64)  30 mL Oral BID   FLUoxetine  10 mg Oral Daily   furosemide  80 mg Oral Once per day on Sun Mon Wed Fri   insulin aspart  0-9 Units Subcutaneous TID WC   insulin detemir  15 Units Subcutaneous Daily   ipratropium-albuterol  3 mL Nebulization TID   levothyroxine  112 mcg Oral QAC breakfast   mouth rinse  15 mL Mouth Rinse BID   methylPREDNISolone (SOLU-MEDROL) injection  40 mg Intravenous Q8H   midodrine  10 mg Oral 3 times per day on Tue Thu Sat   montelukast  10 mg Oral QHS   mupirocin ointment  1 application Nasal BID   rosuvastatin  40 mg Oral Daily   senna-docusate  1 tablet Oral QHS   sevelamer carbonate  2,400 mg Oral TID WC   sodium chloride flush  3 mL Intravenous Q12H   Continuous Infusions:  sodium chloride     sodium chloride     sodium chloride     azithromycin 500 mg (09/11/18 1327)     LOS: 4 days      Time spent: 25 minutes   Dessa Phi, DO [facility] [email or web address] 09/12/2018, 1:10 PM

## 2018-09-12 NOTE — Care Management Important Message (Signed)
Important Message  Patient Details  Name: Joan Mosley MRN: [id number] Date of Birth: 1945/04/14   Medicare Important Message Given:  Yes     [name] 09/12/2018, 2:33 PM

## 2018-09-12 NOTE — Progress Notes (Addendum)
Subjective: no cos with NRB mask / HD tomorrow on schedule / reports cannot  Stand for hd wts   Objective Vital signs in last 24 hours: Vitals:   09/12/18 0100 09/12/18 0500 09/12/18 0813 09/12/18 0821  BP: 117/75 114/81  122/64  Pulse:    74  Resp:    20  Temp:  (!) 97.4 F (36.3 C)  97.8 F (36.6 C)  TempSrc:  Axillary  Oral  SpO2:   93% 92%  Weight:      Height:       Weight change:   Physical Exam: General: Alert  Obese , elderly female NAD  In bed  Heart: RRR, no rub Lungs: Decr BS bases, Non labored breathing  Abdomen: Obese, soft , NT, ND Extremities: trace bipedal edema, bilat ruddy discoloration lower legs  Dialysis Access:Perm cath  Dressing intact   [facility] T: 4 hr; 180NRe; DFR 400/DFR auto 1.5; EDW 105.5kg, 2K/2Ca No heparin bolus Calcitriol 1.5 mcg PO qHD Sensipar 60 mg PO qHD renvela 800mg  3 tabs AC Midodrine 10 mg PO TIW before HD and mid treatment for SBP <95  Problem/Plan: 1. Weakness, repeated falls: known SDH, debility.  Agreeable to SNF now.   2.  Hypoxia, baseline COPD:  Chronic O2 requirement of 5L.   CT scan yester = without pulmonary edema.cw copd   Primary treating for COPD.  Wean O2 today.   2. ESRD: TTS.  HD sat per schedule.  UF 3.6L. tolerating  No heparin with SDH. Next planned HD tues.   3. HTN/volume:  Midodrine pre HD.    4. Anemia: Hb 8.5, started ESA 8/15 on 60 mcg Aranesp   5. Secondary hyperparathyroidism:  q hd  calcitriol/ sensipar in HD, renvela as binder  6. Nutrition:  Albumin 3.1. prostat BID here.   7.  Dispo: agreeable to SNF.  Await placement.  Ok to d/c once plans in place.   Yesterday =Noted Dr Johnney Ou  spoke with her Daughter, [name]  via telephone just to let her know updates.     Ernest Haber, PA-C [place] [phone] 09/12/2018,10:12 AM  LOS: 4 days   Pt seen, examined and agree w A/P as above.  Kelly Splinter  MD 09/12/2018, 11:38  AM    Labs: Basic Metabolic Panel: Recent Labs  Lab 09/10/18 0525 09/11/18 0617 09/12/18 0714  NA 133* 135 131*  K 5.3* 4.6 5.1  CL 97* 96* 93*  CO2 24 24 22   GLUCOSE 178* 204* 200*  BUN 38* 40* 70*  CREATININE 6.59* 4.57* 6.63*  CALCIUM 9.1 9.1 8.6*   Liver Function Tests: Recent Labs  Lab 09/08/18 1530  AST 10*  ALT 11  ALKPHOS 89  BILITOT 0.7  PROT 6.7  ALBUMIN 3.1*   No results for input(s): LIPASE, AMYLASE in the last 168 hours. No results for input(s): AMMONIA in the last 168 hours. CBC: Recent Labs  Lab 09/06/18 0744 09/07/18 0353 09/08/18 1530  09/09/18 0231 09/10/18 0525 09/10/18 0815  WBC 11.8* 10.1 11.7*  --  10.3 5.6 6.3  NEUTROABS 9.4*  --   --   --   --   --   --   HGB 9.4* 9.2* 8.5*   < > 9.6* 8.9* 8.5*  HCT 30.9* 30.0* 28.3*   < > 31.3* 28.8* 27.9*  MCV 96.6 97.1 99.6  --  97.5 96.3 97.9  PLT 171 161 151  --  155 148* 169   < > =  values in this interval not displayed.   Cardiac Enzymes: No results for input(s): CKTOTAL, CKMB, CKMBINDEX, TROPONINI in the last 168 hours. CBG: Recent Labs  Lab 09/11/18 0818 09/11/18 1125 09/11/18 1530 09/11/18 2114 09/12/18 0825  GLUCAP 193* 262* 250* 303* 195*    Studies/Results: Ct Angio Chest Pe W Or Wo Contrast  Result Date: 09/11/2018 CLINICAL DATA:  PE suspected, high pretest probability. EXAM: CT ANGIOGRAPHY CHEST WITH CONTRAST TECHNIQUE: Multidetector CT imaging of the chest was performed using the standard protocol during bolus administration of intravenous contrast. Multiplanar CT image reconstructions and MIPs were obtained to evaluate the vascular anatomy. CONTRAST:  53mL OMNIPAQUE IOHEXOL 350 MG/ML SOLN COMPARISON:  None. FINDINGS: Cardiovascular: There is no pulmonary embolism identified within the main, lobar or segmental pulmonary arteries bilaterally. Main pulmonary arteries are prominent indicating chronic pulmonary artery hypertension. No thoracic aortic aneurysm. Aortic  atherosclerosis. No pericardial effusion. Cardiomegaly. Diffuse coronary artery calcifications. Mediastinum/Nodes: No mass or enlarged lymph nodes seen within the mediastinum or perihilar regions. Esophagus is unremarkable. Trachea and central bronchi are unremarkable. Lungs/Pleura: Small bibasilar consolidations, RIGHT greater than LEFT, likely chronic atelectasis. No pleural effusion or pneumothorax seen. Mild emphysematous changes at the lung apices. Associated scarring/fibrosis within the upper lungs. Upper Abdomen: Limited images of the upper abdomen are unremarkable. Musculoskeletal: Degenerative spondylosis throughout the slightly scoliotic thoracolumbar spine, mild to moderate in degree. No acute appearing osseous abnormality. Review of the MIP images confirms the above findings. IMPRESSION: 1. No pulmonary embolism seen. Chronic pulmonary artery hypertension. 2. Small bibasilar consolidations, RIGHT greater than LEFT, likely chronic atelectasis. 3. Cardiomegaly. Diffuse coronary artery calcifications. Aortic Atherosclerosis (ICD10-I70.0) and Emphysema (ICD10-J43.9). Electronically Signed   By: Franki Cabot M.D.   On: 09/11/2018 13:14   Medications: . sodium chloride    . sodium chloride    . sodium chloride    . azithromycin 500 mg (09/11/18 1327)   . calcitRIOL  1.5 mcg Oral Q T,Th,Sa-HD  . Chlorhexidine Gluconate Cloth  6 each Topical Q0600  . cinacalcet  60 mg Oral Q T,Th,Sa-HD  . darbepoetin (ARANESP) injection - DIALYSIS  60 mcg Intravenous Once  . feeding supplement (PRO-STAT SUGAR FREE 64)  30 mL Oral BID  . FLUoxetine  10 mg Oral Daily  . furosemide  80 mg Oral Once per day on Sun Mon Wed Fri  . insulin aspart  0-9 Units Subcutaneous TID WC  . insulin detemir  15 Units Subcutaneous Daily  . ipratropium-albuterol  3 mL Nebulization TID  . levothyroxine  112 mcg Oral QAC breakfast  . mouth rinse  15 mL Mouth Rinse BID  . methylPREDNISolone (SOLU-MEDROL) injection  40 mg  Intravenous Q8H  . midodrine  10 mg Oral 3 times per day on Tue Thu Sat  . montelukast  10 mg Oral QHS  . mupirocin ointment  1 application Nasal BID  . rosuvastatin  40 mg Oral Daily  . senna-docusate  1 tablet Oral QHS  . sevelamer carbonate  2,400 mg Oral TID WC  . sodium chloride flush  3 mL Intravenous Q12H

## 2018-09-12 NOTE — Progress Notes (Addendum)
Inpatient Diabetes Program Recommendations  AACE/ADA: New Consensus Statement on Inpatient Glycemic Control   Target Ranges:  Prepandial:   less than 140 mg/dL      Peak postprandial:   less than 180 mg/dL (1-2 hours)      Critically ill patients:  140 - 180 mg/dL   Results for Joan Mosley, Joan Mosley (MRN [id number]) as of 09/12/2018 14:06  Ref. Range 09/11/2018 08:18 09/11/2018 11:25 09/11/2018 15:30 09/11/2018 21:14 09/12/2018 08:25 09/12/2018 12:09  Glucose-Capillary Latest Ref Range: 70 - 99 mg/dL 193 (H) 262 (H) 250 (H) 303 (H) 195 (H) 255 (H)   Review of Glycemic Control  Outpatient Diabetes medications: Levemir 15 units daily, Humalog 1-5 units TID with meals Current orders for Inpatient glycemic control: Levemir 15 units daily, Novolog 0-9 units TID with meals; Solumedrol 40 mg Q8H  Inpatient Diabetes Program Recommendations:   Insulin-Basal: Please consider increasing Levemir to 17 units daily.  Insulin-Meal Coverage: If steroids will be continued, please consider ordering Novolog 3 units TID with meals for meal coverage if patient eats at least 50% of meals.  Thanks, Barnie Alderman, RN, MSN, CDE Diabetes Coordinator Inpatient Diabetes Program [phone] (Team Pager from 8am to 5pm)

## 2018-09-13 LAB — BASIC METABOLIC PANEL
Anion gap: 17 — ABNORMAL HIGH (ref 5–15)
BUN: 98 mg/dL — ABNORMAL HIGH (ref 8–23)
CO2: 22 mmol/L (ref 22–32)
Calcium: 8.4 mg/dL — ABNORMAL LOW (ref 8.9–10.3)
Chloride: 91 mmol/L — ABNORMAL LOW (ref 98–111)
Creatinine, Ser: 8.28 mg/dL — ABNORMAL HIGH (ref 0.44–1.00)
GFR calc Af Amer: 5 mL/min — ABNORMAL LOW (ref >60)
GFR calc non Af Amer: 4 mL/min — ABNORMAL LOW (ref >60)
Glucose, Bld: 250 mg/dL — ABNORMAL HIGH (ref 70–99)
Potassium: 5.6 mmol/L — ABNORMAL HIGH (ref 3.5–5.1)
Sodium: 130 mmol/L — ABNORMAL LOW (ref 135–145)

## 2018-09-13 LAB — CBC
HCT: 27.9 % — ABNORMAL LOW (ref 36.0–46.0)
Hemoglobin: 8.7 g/dL — ABNORMAL LOW (ref 12.0–15.0)
MCH: 29.9 pg (ref 26.0–34.0)
MCHC: 31.2 g/dL (ref 30.0–36.0)
MCV: 95.9 fL (ref 80.0–100.0)
Platelets: 171 10*3/uL (ref 150–400)
RBC: 2.91 MIL/uL — ABNORMAL LOW (ref 3.87–5.11)
RDW: 15.6 % — ABNORMAL HIGH (ref 11.5–15.5)
WBC: 13.1 10*3/uL — ABNORMAL HIGH (ref 4.0–10.5)
nRBC: 0 % (ref 0.0–0.2)

## 2018-09-13 LAB — CULTURE, BLOOD (ROUTINE X 2)
Culture: NO GROWTH
Culture: NO GROWTH

## 2018-09-13 LAB — GLUCOSE, CAPILLARY
Glucose-Capillary: 139 mg/dL — ABNORMAL HIGH (ref 70–99)
Glucose-Capillary: 265 mg/dL — ABNORMAL HIGH (ref 70–99)
Glucose-Capillary: 148 mg/dL — ABNORMAL HIGH (ref 70–99)

## 2018-09-13 MED ORDER — HEPARIN SODIUM (PORCINE) 1000 UNIT/ML IJ SOLN
INTRAMUSCULAR | Status: AC
  Filled 2018-09-13: qty 4

## 2018-09-13 MED ORDER — CALCITRIOL 0.5 MCG PO CAPS
ORAL_CAPSULE | ORAL | Status: AC
  Administered 2018-09-13: 1.5 ug via ORAL
  Filled 2018-09-13: qty 3

## 2018-09-13 NOTE — Progress Notes (Signed)
PROGRESS NOTE    Joan Mosley  [id number] DOB: 1945-02-02 DOA: 09/08/2018 PCP: Flossie Buffy, NP     Brief Narrative:  Joan Mosley is a 73 year old female with past medical history significant for ESRD on dialysis Tuesday, Thursday, Saturday, morbid obesity, type 2 diabetes with recent admission for subdural hematoma and discharged home on 8/12 after refusing SNF.  She returns to the hospital with chief complaint of weakness, lethargy, as well as worsening acute on chronic hypoxemic respiratory failure. Rapid response called 8/14 AM, multiple desaturation events.  She was de-satting into the 70s on 6 L nasal cannula, increased to nonrebreather to maintain saturation.  She was started on BiPAP, weaned down to nonrebreather and high flow nasal cannula O2.  CTA completed which ruled out for PE.  New events last 24 hours / Subjective: Seen in dialysis.  States that she had some shortness of breath last night, much better today.  No new complaints today.  Assessment & Plan:   Active Problems:   Pulmonary edema   Respiratory failure (HCC)   Acute on chronic hypoxemic respiratory failure -Patient on 5 L nasal cannula O2 at baseline -COVID-19 negative -Chest x-ray 8/13 with bibasilar atelectasis versus peribronchial airspace consolidation.  Personally reviewed, no significant change from x-ray in July 2020 -Unable to wean down back to her baseline levels.  Off of BiPAP this morning.  CTA chest negative for PE.  Small bibasilar consolidation right greater than left, likely chronic atelectasis. -Continue to wean as able to baseline of 5 L.  COPD exacerbation -Breathing treatments.  Continue Solu-Medrol today as well as azithromycin for atypical coverage  ESRD on hemodialysis TThSa -Appreciate nephrology  Hypotension -Continue midodrine during dialysis  Diabetes mellitus type 2 -Continue Levemir, sliding scale insulin  Hypothyroidism -Continue  Synthroid  Hyperlipidemia -Continue Crestor  Subdural hematoma -Repeat CT head 8/13 without significant change, mixed density bilateral convexity subdural hematoma, proximal thickness 1 cm on each side.  No additional hyperdense bleeding.  Minimal mass-effect   Demand ischemia -Elevated troponin in setting of ESRD, troponin has been flat 22--> 22  Deconditioning -SNF placement was recommended previous admission, which patient had refused -Agreeable to SNF placement this time.  PT OT and social worker consulted  Depression, anxiety -Continue Prozac, Klonopin          DVT prophylaxis: SCD Code Status: Full code Family Communication: None Disposition Plan: Patient agreeable to SNF placement   Consultants:   Nephrology  Procedures:   None  Antimicrobials:  Anti-infectives (From admission, onward)   Start     Dose/Rate Route Frequency Ordered Stop   09/09/18 1230  azithromycin (ZITHROMAX) 500 mg in sodium chloride 0.9 % 250 mL IVPB     500 mg 250 mL/hr over 60 Minutes Intravenous Every 24 hours 09/09/18 1135 09/14/18 1229       Objective: Vitals:   09/13/18 0830 09/13/18 0900 09/13/18 0930 09/13/18 1000  BP: (!) 150/87 134/83  109/84  Pulse: 72 84 77 79  Resp: 20 (!) 25 (!) 21 20  Temp:      TempSrc:      SpO2:      Weight:      Height:        Intake/Output Summary (Last 24 hours) at 09/13/2018 1024 Last data filed at 09/12/2018 1800 Gross per 24 hour  Intake 1050 ml  Output --  Net 1050 ml   Filed Weights   09/10/18 1203 09/13/18 0644 09/13/18 0712  Weight: 106.8 kg 110 kg  108.6 kg    Examination: General exam: Appears calm and comfortable  Respiratory system: Clear to auscultation anteriorly.  Without any acute respiratory distress or accessory muscle use.  No conversational dyspnea. Cardiovascular system: S1 & S2 heard, RRR. No JVD, murmurs, rubs, gallops or clicks. No pedal edema. Gastrointestinal system: Abdomen is nondistended, soft and  nontender. No organomegaly or masses felt. Normal bowel sounds heard. Central nervous system: Alert and oriented. No focal neurological deficits. Extremities: Symmetric 5 x 5 power. Skin: No rashes, lesions or ulcers Psychiatry: Judgement and insight appear normal. Mood & affect appropriate.   Data Reviewed: I have personally reviewed following labs and imaging studies  CBC: Recent Labs  Lab 09/08/18 1530 09/08/18 1541 09/09/18 0231 09/10/18 0525 09/10/18 0815 09/13/18 0744  WBC 11.7*  --  10.3 5.6 6.3 13.1*  HGB 8.5* 11.6* 9.6* 8.9* 8.5* 8.7*  HCT 28.3* 34.0* 31.3* 28.8* 27.9* 27.9*  MCV 99.6  --  97.5 96.3 97.9 95.9  PLT 151  --  155 148* 169 [id number]   Basic Metabolic Panel: Recent Labs  Lab 09/09/18 0231 09/10/18 0525 09/11/18 0617 09/12/18 0714 09/13/18 0409  NA 137 133* 135 131* 130*  K 3.7 5.3* 4.6 5.1 5.6*  CL 99 97* 96* 93* 91*  CO2 26 24 24 22 22   GLUCOSE 106* 178* 204* 200* 250*  BUN 13 38* 40* 70* 98*  CREATININE 4.15* 6.59* 4.57* 6.63* 8.28*  CALCIUM 8.8* 9.1 9.1 8.6* 8.4*   GFR: Estimated Creatinine Clearance: 6.7 mL/min (A) (by C-G formula based on SCr of 8.28 mg/dL (H)). Liver Function Tests: Recent Labs  Lab 09/08/18 1530  AST 10*  ALT 11  ALKPHOS 89  BILITOT 0.7  PROT 6.7  ALBUMIN 3.1*   No results for input(s): LIPASE, AMYLASE in the last 168 hours. No results for input(s): AMMONIA in the last 168 hours. Coagulation Profile: Recent Labs  Lab 09/08/18 1530  INR 1.1   Cardiac Enzymes: No results for input(s): CKTOTAL, CKMB, CKMBINDEX, TROPONINI in the last 168 hours. BNP (last 3 results) No results for input(s): PROBNP in the last 8760 hours. HbA1C: No results for input(s): HGBA1C in the last 72 hours. CBG: Recent Labs  Lab 09/11/18 2114 09/12/18 0825 09/12/18 1209 09/12/18 1701 09/12/18 2114  GLUCAP 303* 195* 255* 219* 167*   Lipid Profile: No results for input(s): CHOL, HDL, LDLCALC, TRIG, CHOLHDL, LDLDIRECT in the last 72  hours. Thyroid Function Tests: No results for input(s): TSH, T4TOTAL, FREET4, T3FREE, THYROIDAB in the last 72 hours. Anemia Panel: No results for input(s): VITAMINB12, FOLATE, FERRITIN, TIBC, IRON, RETICCTPCT in the last 72 hours. Sepsis Labs: Recent Labs  Lab 09/08/18 1530  LATICACIDVEN 0.8    Recent Results (from the past 240 hour(s))  SARS CORONAVIRUS 2 Nasal Swab Aptima Multi Swab     Status: None   Collection Time: 09/06/18  9:16 AM   Specimen: Aptima Multi Swab; Nasal Swab  Result Value Ref Range Status   SARS Coronavirus 2 NEGATIVE NEGATIVE Final    Comment: (NOTE) SARS-CoV-2 target nucleic acids are NOT DETECTED. The SARS-CoV-2 RNA is generally detectable in upper and lower respiratory specimens during the acute phase of infection. Negative results do not preclude SARS-CoV-2 infection, do not rule out co-infections with other pathogens, and should not be used as the sole basis for treatment or other patient management decisions. Negative results must be combined with clinical observations, patient history, and epidemiological information. The expected result is Negative. Fact Sheet for Patients: SugarRoll.be  Fact Sheet for Healthcare Providers: https://www.woods-mathews.com/ This test is not yet approved or cleared by the [place] FDA and  has been authorized for detection and/or diagnosis of SARS-CoV-2 by FDA under an Emergency Use Authorization (EUA). This EUA will remain  in effect (meaning this test can be used) for the duration of the COVID-19 declaration under Section 56 4(b)(1) of the Act, 21 U.S.C. section 360bbb-3(b)(1), unless the authorization is terminated or revoked sooner. Performed at [facility], [place] [address] [place], [place] [address]   Culture, blood (Routine X 2) w Reflex to ID Panel     Status: None   Collection Time: 09/08/18  2:40 PM   Specimen: BLOOD RIGHT FOREARM  Result Value Ref  Range Status   Specimen Description BLOOD RIGHT FOREARM  Final   Special Requests   Final    BOTTLES DRAWN AEROBIC ONLY Blood Culture results may not be optimal due to an inadequate volume of blood received in culture bottles   Culture   Final    NO GROWTH 5 DAYS Performed at [facility], [place] [address], [place], [place] [address]    Report Status 09/13/2018 FINAL  Final  Culture, blood (Routine X 2) w Reflex to ID Panel     Status: None   Collection Time: 09/08/18  3:14 PM   Specimen: BLOOD  Result Value Ref Range Status   Specimen Description BLOOD SITE NOT SPECIFIED  Final   Special Requests   Final    BOTTLES DRAWN AEROBIC AND ANAEROBIC Blood Culture results may not be optimal due to an inadequate volume of blood received in culture bottles   Culture   Final    NO GROWTH 5 DAYS Performed at [facility], [place] [address] [place], [place] [address]    Report Status 09/13/2018 FINAL  Final  SARS Coronavirus 2 [facility] order, Performed in [facility] hospital lab) Nasopharyngeal Nasopharyngeal Swab     Status: None   Collection Time: 09/08/18  4:10 PM   Specimen: Nasopharyngeal Swab  Result Value Ref Range Status   SARS Coronavirus 2 NEGATIVE NEGATIVE Final    Comment: (NOTE) If result is NEGATIVE SARS-CoV-2 target nucleic acids are NOT DETECTED. The SARS-CoV-2 RNA is generally detectable in upper and lower  respiratory specimens during the acute phase of infection. The lowest  concentration of SARS-CoV-2 viral copies this assay can detect is 250  copies / mL. A negative result does not preclude SARS-CoV-2 infection  and should not be used as the sole basis for treatment or other  patient management decisions.  A negative result may occur with  improper specimen collection / handling, submission of specimen other  than nasopharyngeal swab, presence of viral mutation(s) within the  areas targeted by this assay, and inadequate number of viral copies  (<250 copies /  mL). A negative result must be combined with clinical  observations, patient history, and epidemiological information. If result is POSITIVE SARS-CoV-2 target nucleic acids are DETECTED. The SARS-CoV-2 RNA is generally detectable in upper and lower  respiratory specimens dur ing the acute phase of infection.  Positive  results are indicative of active infection with SARS-CoV-2.  Clinical  correlation with patient history and other diagnostic information is  necessary to determine patient infection status.  Positive results do  not rule out bacterial infection or co-infection with other viruses. If result is PRESUMPTIVE POSTIVE SARS-CoV-2 nucleic acids MAY BE PRESENT.   A presumptive positive result was obtained on  the submitted specimen  and confirmed on repeat testing.  While 2019 novel coronavirus  (SARS-CoV-2) nucleic acids may be present in the submitted sample  additional confirmatory testing may be necessary for epidemiological  and / or clinical management purposes  to differentiate between  SARS-CoV-2 and other Sarbecovirus currently known to infect humans.  If clinically indicated additional testing with an alternate test  methodology [phone]) is advised. The SARS-CoV-2 RNA is generally  detectable in upper and lower respiratory sp ecimens during the acute  phase of infection. The expected result is Negative. Fact Sheet for Patients:  StrictlyIdeas.no Fact Sheet for Healthcare Providers: BankingDealers.co.za This test is not yet approved or cleared by the [place] FDA and has been authorized for detection and/or diagnosis of SARS-CoV-2 by FDA under an Emergency Use Authorization (EUA).  This EUA will remain in effect (meaning this test can be used) for the duration of the COVID-19 declaration under Section 564(b)(1) of the Act, 21 U.S.C. section 360bbb-3(b)(1), unless the authorization is terminated or revoked  sooner. Performed at [facility], [place] [address] [place], [place] [address]   MRSA PCR Screening     Status: Abnormal   Collection Time: 09/09/18 12:44 AM   Specimen: Nasopharyngeal  Result Value Ref Range Status   MRSA by PCR POSITIVE (A) NEGATIVE Final    Comment: CRITICAL RESULT CALLED TO, READ BACK BY AND VERIFIED WITH: RN Livingston Diones RN:[id number] @[id number]  THANEY Performed at [facility], [place] [address], [place], [place] [address]       Radiology Studies: Ct Angio Chest Pe W Or Wo Contrast  Result Date: 09/11/2018 CLINICAL DATA:  PE suspected, high pretest probability. EXAM: CT ANGIOGRAPHY CHEST WITH CONTRAST TECHNIQUE: Multidetector CT imaging of the chest was performed using the standard protocol during bolus administration of intravenous contrast. Multiplanar CT image reconstructions and MIPs were obtained to evaluate the vascular anatomy. CONTRAST:  20mL OMNIPAQUE IOHEXOL 350 MG/ML SOLN COMPARISON:  None. FINDINGS: Cardiovascular: There is no pulmonary embolism identified within the main, lobar or segmental pulmonary arteries bilaterally. Main pulmonary arteries are prominent indicating chronic pulmonary artery hypertension. No thoracic aortic aneurysm. Aortic atherosclerosis. No pericardial effusion. Cardiomegaly. Diffuse coronary artery calcifications. Mediastinum/Nodes: No mass or enlarged lymph nodes seen within the mediastinum or perihilar regions. Esophagus is unremarkable. Trachea and central bronchi are unremarkable. Lungs/Pleura: Small bibasilar consolidations, RIGHT greater than LEFT, likely chronic atelectasis. No pleural effusion or pneumothorax seen. Mild emphysematous changes at the lung apices. Associated scarring/fibrosis within the upper lungs. Upper Abdomen: Limited images of the upper abdomen are unremarkable. Musculoskeletal: Degenerative spondylosis throughout the slightly scoliotic thoracolumbar spine, mild to moderate in degree. No acute appearing osseous  abnormality. Review of the MIP images confirms the above findings. IMPRESSION: 1. No pulmonary embolism seen. Chronic pulmonary artery hypertension. 2. Small bibasilar consolidations, RIGHT greater than LEFT, likely chronic atelectasis. 3. Cardiomegaly. Diffuse coronary artery calcifications. Aortic Atherosclerosis (ICD10-I70.0) and Emphysema (ICD10-J43.9). Electronically Signed   By: Franki Cabot M.D.   On: 09/11/2018 13:14      Scheduled Meds:  calcitRIOL  1.5 mcg Oral Q T,Th,Sa-HD   Chlorhexidine Gluconate Cloth  6 each Topical Q0600   cinacalcet  60 mg Oral Q T,Th,Sa-HD   darbepoetin (ARANESP) injection - DIALYSIS  60 mcg Intravenous Once   docusate sodium  200 mg Oral QHS   feeding supplement (PRO-STAT SUGAR FREE 64)  30 mL Oral BID   FLUoxetine  10 mg Oral Daily   insulin aspart  0-9  Units Subcutaneous TID WC   insulin aspart  3 Units Subcutaneous TID WC   insulin detemir  17 Units Subcutaneous Daily   ipratropium-albuterol  3 mL Nebulization TID   levothyroxine  112 mcg Oral QAC breakfast   mouth rinse  15 mL Mouth Rinse BID   methylPREDNISolone (SOLU-MEDROL) injection  40 mg Intravenous Q8H   midodrine  10 mg Oral 3 times per day on Tue Thu Sat   mometasone-formoterol  2 puff Inhalation BID   montelukast  10 mg Oral QHS   mupirocin ointment  1 application Nasal BID   rosuvastatin  40 mg Oral Daily   senna-docusate  1 tablet Oral QHS   sevelamer carbonate  2,400 mg Oral TID WC   sodium chloride flush  3 mL Intravenous Q12H   Continuous Infusions:  sodium chloride     sodium chloride     sodium chloride     azithromycin 500 mg (09/12/18 1342)     LOS: 5 days      Time spent: 25 minutes   Dessa Phi, DO [facility] [email or web address] 09/13/2018, 10:24 AM

## 2018-09-13 NOTE — Progress Notes (Signed)
PT Cancellation Note  Patient Details Name: Baylie Starliper MRN: [id number] DOB: Sep 04, 1945   Cancelled Treatment:    Reason Eval/Treat Not Completed: Fatigue/lethargy limiting ability to participate; back from HD and attempted for mobility training but pt declined stating too fatigued didn't sleep and needs to rest.  Encouraged to participate due to now declining SNF.  Continued to want to rest for now.  Will attempt again another day.    Reginia Naas 09/13/2018, 4:20 PM  Magda Kiel, [place] [phone] 09/13/2018

## 2018-09-13 NOTE — Progress Notes (Addendum)
Subjective:  Seen on hd / was tolerating 3.0 liter uf / Had 2 large cups of fluid at bedside coffee and ice water,re-educated her on volume  restrictions   Objective Vital signs in last 24 hours: Vitals:   09/13/18 1117 09/13/18 1142 09/13/18 1158 09/13/18 1325  BP: (!) 145/75  120/64   Pulse: 78  75   Resp: 17  18   Temp: 98.1 F (36.7 C)  98.9 F (37.2 C)   TempSrc: Oral  Oral   SpO2: 97% 100% 100% 99%  Weight:      Height:       Weight change:   Physical Exam: General: Alert  Obese , elderly female NAD  In bed  Heart: RRR, no rub Lungs: Decr BS bases, Non labored breathing  Abdomen: Obese, soft , NT, ND Extremities: trace bipedal edema, bilat ruddy discoloration lower legs  Dialysis Access:Perm cath patent on hd   [facility] T: 4 hr; 180NRe; DFR 400/DFR auto 1.5; EDW 105.5kg, 2K/2Ca No heparin bolus Calcitriol 1.5 mcg PO qHD Sensipar 60 mg PO qHD renvela 800mg  3 tabs AC Midodrine 10 mg PO TIW before HD and mid treatment for SBP <95  Problem/Plan: 1. Weakness, repeated falls: known SDH, debility. was Agreeable to SNF now.changing her mind   2. Hypoxia, baseline COPD: Chronic O2 requirement of 5L.  CT scan yester = without pulmonary edema.cw copd  Primary treating for COPD.Wean O2 today./ volume overload with Pt fluid noncompliance a factor as witnessed in [place]. Tolerating 3 l uf today  /3.2 kg gain since last tx   2. ESRD: TTS. HD sat per schedule. UF 3.6L.tolerating No heparin with SDH. Next planned HD tues.  3.HTN/volume: Midodrine pre HD.   4. Anemia: Hb 8.7, startedESA 8/15on 60 mcg Aranesp   5. Secondary hyperparathyroidism:q hd  calcitriol/ sensipar in HD, renvela as binder  6. Nutrition: Albumin 3.1. prostat BID here.   7. Dispo: was agreeable to SNF.and now refusing     Noted Dr Johnney Ou 09/11/18, spoke with her Daughter, [name]  via telephone just to let her know updates.   Ernest Haber, PA-C [place] [phone] 09/13/2018,1:28 PM  LOS: 5 days   Pt seen, examined and agree w A/P as above.  Kelly Splinter  MD 09/13/2018, 3:22 PM    Labs: Basic Metabolic Panel: Recent Labs  Lab 09/11/18 0617 09/12/18 0714 09/13/18 0409  NA 135 131* 130*  K 4.6 5.1 5.6*  CL 96* 93* 91*  CO2 24 22 22   GLUCOSE 204* 200* 250*  BUN 40* 70* 98*  CREATININE 4.57* 6.63* 8.28*  CALCIUM 9.1 8.6* 8.4*   Liver Function Tests: Recent Labs  Lab 09/08/18 1530  AST 10*  ALT 11  ALKPHOS 89  BILITOT 0.7  PROT 6.7  ALBUMIN 3.1*   No results for input(s): LIPASE, AMYLASE in the last 168 hours. No results for input(s): AMMONIA in the last 168 hours. CBC: Recent Labs  Lab 09/08/18 1530  09/09/18 0231 09/10/18 0525 09/10/18 0815 09/13/18 0744  WBC 11.7*  --  10.3 5.6 6.3 13.1*  HGB 8.5*   < > 9.6* 8.9* 8.5* 8.7*  HCT 28.3*   < > 31.3* 28.8* 27.9* 27.9*  MCV 99.6  --  97.5 96.3 97.9 95.9  PLT 151  --  155 148* 169 171   < > = values in this interval not displayed.   Cardiac Enzymes: No results for input(s): CKTOTAL, CKMB, CKMBINDEX,  TROPONINI in the last 168 hours. CBG: Recent Labs  Lab 09/12/18 0825 09/12/18 1209 09/12/18 1701 09/12/18 2114 09/13/18 1157  GLUCAP 195* 255* 219* 167* 139*    Studies/Results: No results found. Medications: . sodium chloride    . sodium chloride    . sodium chloride    . azithromycin 500 mg (09/12/18 1342)   . calcitRIOL  1.5 mcg Oral Q T,Th,Sa-HD  . Chlorhexidine Gluconate Cloth  6 each Topical Q0600  . cinacalcet  60 mg Oral Q T,Th,Sa-HD  . darbepoetin (ARANESP) injection - DIALYSIS  60 mcg Intravenous Once  . docusate sodium  200 mg Oral QHS  . feeding supplement (PRO-STAT SUGAR FREE 64)  30 mL Oral BID  . FLUoxetine  10 mg Oral Daily  . heparin      . insulin aspart  0-9 Units Subcutaneous TID WC  . insulin aspart  3 Units Subcutaneous TID WC  . insulin detemir  17 Units Subcutaneous Daily  .  ipratropium-albuterol  3 mL Nebulization TID  . levothyroxine  112 mcg Oral QAC breakfast  . mouth rinse  15 mL Mouth Rinse BID  . methylPREDNISolone (SOLU-MEDROL) injection  40 mg Intravenous Q8H  . midodrine  10 mg Oral 3 times per day on Tue Thu Sat  . mometasone-formoterol  2 puff Inhalation BID  . montelukast  10 mg Oral QHS  . mupirocin ointment  1 application Nasal BID  . rosuvastatin  40 mg Oral Daily  . senna-docusate  1 tablet Oral QHS  . sevelamer carbonate  2,400 mg Oral TID WC  . sodium chloride flush  3 mL Intravenous Q12H

## 2018-09-13 NOTE — Progress Notes (Addendum)
Renal Navigator informed that patient and family have declined SNF (per CSW) and that family is no longer able to get patient to OP HD clinic[facility] because they can't get her in and out of the car.  Renal Navigator met with patient in HD unit to discuss barrier to OP HD treatment and she is agreeable to applying for [place] (patient is out of service area for [facility]). Renal Navigator explained to patient that there may be a $1.60 fee per ride and she states that she will be able to pay this. Renal Navigator explained that as long as she is able to sit, she is not eligible for transport in an ambulance, which was a question that family had raised. She stated understanding.  Renal Navigator assisted patient in completing application and faxed to [facility] at [phone]. Patient stated appreciation. Renal Navigator will follow up on application.  Fax would not go through multiple times. Renal Navigator called staff at [facility] and obtained email address and scanned and emailed document. Renal Navigator received phone call that application has been received. Renal Navigator explained that patient needs transportation in order to have a safe discharge from the hospital and requested a call from staff regarding application, but will also call daily to follow up.  Alphonzo Cruise, [place] Renal Navigator [phone]

## 2018-09-13 NOTE — Progress Notes (Signed)
PT Cancellation Note  Patient Details Name: Kanyla Zeien MRN: [id number] DOB: 1945-09-28   Cancelled Treatment:    Reason Eval/Treat Not Completed: Patient at procedure or test/unavailable; currently in HD.  Will attempt later if time allows and she feels up to it after dialysis.    Reginia Naas 09/13/2018, 11:45 AM  Magda Kiel, [place] [phone] 09/13/2018

## 2018-09-14 DIAGNOSIS — I959 Hypotension, unspecified: Secondary | ICD-10-CM

## 2018-09-14 DIAGNOSIS — J441 Chronic obstructive pulmonary disease with (acute) exacerbation: Principal | ICD-10-CM

## 2018-09-14 DIAGNOSIS — E1122 Type 2 diabetes mellitus with diabetic chronic kidney disease: Secondary | ICD-10-CM

## 2018-09-14 LAB — GLUCOSE, CAPILLARY
Glucose-Capillary: 177 mg/dL — ABNORMAL HIGH (ref 70–99)
Glucose-Capillary: 171 mg/dL — ABNORMAL HIGH (ref 70–99)
Glucose-Capillary: 248 mg/dL — ABNORMAL HIGH (ref 70–99)
Glucose-Capillary: 195 mg/dL — ABNORMAL HIGH (ref 70–99)
Glucose-Capillary: 158 mg/dL — ABNORMAL HIGH (ref 70–99)

## 2018-09-14 LAB — BASIC METABOLIC PANEL
Anion gap: 14 (ref 5–15)
BUN: 45 mg/dL — ABNORMAL HIGH (ref 8–23)
CO2: 26 mmol/L (ref 22–32)
Calcium: 8.2 mg/dL — ABNORMAL LOW (ref 8.9–10.3)
Chloride: 97 mmol/L — ABNORMAL LOW (ref 98–111)
Creatinine, Ser: 4.48 mg/dL — ABNORMAL HIGH (ref 0.44–1.00)
GFR calc Af Amer: 10 mL/min — ABNORMAL LOW (ref >60)
GFR calc non Af Amer: 9 mL/min — ABNORMAL LOW (ref >60)
Glucose, Bld: 203 mg/dL — ABNORMAL HIGH (ref 70–99)
Potassium: 4.6 mmol/L (ref 3.5–5.1)
Sodium: 137 mmol/L (ref 135–145)

## 2018-09-14 MED ORDER — DARBEPOETIN ALFA 60 MCG/0.3ML IJ SOSY
60.0000 ug | PREFILLED_SYRINGE | INTRAMUSCULAR | Status: DC
  Administered 2018-09-15: 60 ug via INTRAVENOUS
  Filled 2018-09-14: qty 0.3

## 2018-09-14 NOTE — Progress Notes (Signed)
PROGRESS NOTE    Joan Mosley  [id number] DOB: [birth year] DOA: 09/08/2018 PCP: Flossie Buffy, NP   Brief Narrative: Joan Mosley is a 73 y.o. female with past medical history significant for ESRD on dialysis Tuesday, Thursday, Saturday, morbid obesity, type 2 diabetes with recent admission for subdural hematoma and discharged home on 8/12 after refusing SNF.  She returns to the hospital with chief complaint of weakness, lethargy, as well as worsening acute on chronic hypoxemic respiratory failure. Rapid response called 8/14 AM, multiple desaturation events.  She was de-satting into the 70s on 6 L nasal cannula, increased to nonrebreather to maintain saturation.  She was started on BiPAP, weaned down to nonrebreather and high flow nasal cannula O2.  CTA completed which ruled out for PE.   Assessment & Plan:   Active Problems:   Pulmonary edema   Respiratory failure ([place])   COPD exacerbation COVID-19 negative. Chest x-ray significant for peribronchial airspace consolidation. Initially on Bipap now on HFNC. No PE on CTA. Patient followed with pulmonologist in [place] prior to move to [facility], [facility],   Acute on chronic respiratory failure with hypoxia Baseline oxygen use of 5L via nasal canula. Currently needing 12 L via HFNC at rest. -Wean as able  ESRD on HD -Nephrology management  Hypotension -Continue midodrine  Diabetes mellitus, type 2 -Continue Levemir and SSI  Hypothyroidism -Continue Synthroid  Hyperlipidemia -Continue Crestor  SDH Stable.  Demand ischemia No chest pain.  Depression/anxiety -Continue Prozac and Klonopin   DVT prophylaxis: SCDs Code Status:   Code Status: Full Code Family Communication: Daughter and son on telephone Disposition Plan: Discharge pending wean to baseline oxygen   Consultants:   None  Procedures:   BiPAP  Antimicrobials:  Azithromycin   Subjective: No concerns  overnight. Short of breath. Cough with sputum production.  Objective: Vitals:   09/14/18 0807 09/14/18 1143 09/14/18 1331 09/14/18 1647  BP:  103/62  (!) 145/85  Pulse:    79  Resp:  20  16  Temp:  97.8 F (36.6 C)  98.2 F (36.8 C)  TempSrc:  Oral  Oral  SpO2: 93%  94% 91%  Weight:      Height:        Intake/Output Summary (Last 24 hours) at 09/14/2018 1745 Last data filed at 09/13/2018 2218 Gross per 24 hour  Intake 240 ml  Output -  Net 240 ml   Filed Weights   09/10/18 1203 09/13/18 0644 09/13/18 0712  Weight: 106.8 kg 110 kg 108.6 kg    Examination:  General exam: Appears calm and comfortable Respiratory system: Diminished breath sounds. No significant wheezing appreciated. Respiratory effort normal. Cardiovascular system: S1 & S2 heard, RRR. No murmurs, rubs, gallops or clicks. Gastrointestinal system: Abdomen is nondistended, soft and nontender. No organomegaly or masses felt. Normal bowel sounds heard. Central nervous system: Alert and oriented. No focal neurological deficits. Extremities: No edema. No calf tenderness Skin: No cyanosis. No rashes Psychiatry: Judgement and insight appear normal. Mood & affect appropriate.     Data Reviewed: I have personally reviewed following labs and imaging studies  CBC: Recent Labs  Lab 09/08/18 1530 09/08/18 1541 09/09/18 0231 09/10/18 0525 09/10/18 0815 09/13/18 0744  WBC 11.7*  --  10.3 5.6 6.3 13.1*  HGB 8.5* 11.6* 9.6* 8.9* 8.5* 8.7*  HCT 28.3* 34.0* 31.3* 28.8* 27.9* 27.9*  MCV 99.6  --  97.5 96.3 97.9 95.9  PLT 151  --  155 148* 169 171  Basic Metabolic Panel: Recent Labs  Lab 09/10/18 0525 09/11/18 0617 09/12/18 0714 09/13/18 0409 09/14/18 0558  NA 133* 135 131* 130* 137  K 5.3* 4.6 5.1 5.6* 4.6  CL 97* 96* 93* 91* 97*  CO2 24 24 22 22 26   GLUCOSE 178* 204* 200* 250* 203*  BUN 38* 40* 70* 98* 45*  CREATININE 6.59* 4.57* 6.63* 8.28* 4.48*  CALCIUM 9.1 9.1 8.6* 8.4* 8.2*   GFR: Estimated  Creatinine Clearance: 12.3 mL/min (A) (by C-G formula based on SCr of 4.48 mg/dL (H)). Liver Function Tests: Recent Labs  Lab 09/08/18 1530  AST 10*  ALT 11  ALKPHOS 89  BILITOT 0.7  PROT 6.7  ALBUMIN 3.1*   No results for input(s): LIPASE, AMYLASE in the last 168 hours. No results for input(s): AMMONIA in the last 168 hours. Coagulation Profile: Recent Labs  Lab 09/08/18 1530  INR 1.1   Cardiac Enzymes: No results for input(s): CKTOTAL, CKMB, CKMBINDEX, TROPONINI in the last 168 hours. BNP (last 3 results) No results for input(s): PROBNP in the last 8760 hours. HbA1C: No results for input(s): HGBA1C in the last 72 hours. CBG: Recent Labs  Lab 09/13/18 2105 09/14/18 0744 09/14/18 1110 09/14/18 1311 09/14/18 1649  GLUCAP 148* 177* 171* 248* 195*   Lipid Profile: No results for input(s): CHOL, HDL, LDLCALC, TRIG, CHOLHDL, LDLDIRECT in the last 72 hours. Thyroid Function Tests: No results for input(s): TSH, T4TOTAL, FREET4, T3FREE, THYROIDAB in the last 72 hours. Anemia Panel: No results for input(s): VITAMINB12, FOLATE, FERRITIN, TIBC, IRON, RETICCTPCT in the last 72 hours. Sepsis Labs: Recent Labs  Lab 09/08/18 1530  LATICACIDVEN 0.8    Recent Results (from the past 240 hour(s))  SARS CORONAVIRUS 2 Nasal Swab Aptima Multi Swab     Status: None   Collection Time: 09/06/18  9:16 AM   Specimen: Aptima Multi Swab; Nasal Swab  Result Value Ref Range Status   SARS Coronavirus 2 NEGATIVE NEGATIVE Final    Comment: (NOTE) SARS-CoV-2 target nucleic acids are NOT DETECTED. The SARS-CoV-2 RNA is generally detectable in upper and lower respiratory specimens during the acute phase of infection. Negative results do not preclude SARS-CoV-2 infection, do not rule out co-infections with other pathogens, and should not be used as the sole basis for treatment or other patient management decisions. Negative results must be combined with clinical observations, patient  history, and epidemiological information. The expected result is Negative. Fact Sheet for Patients: SugarRoll.be Fact Sheet for Healthcare Providers: https://www.woods-mathews.com/ This test is not yet approved or cleared by the [place] FDA and  has been authorized for detection and/or diagnosis of SARS-CoV-2 by FDA under an Emergency Use Authorization (EUA). This EUA will remain  in effect (meaning this test can be used) for the duration of the COVID-19 declaration under Section 56 4(b)(1) of the Act, 21 U.S.C. section 360bbb-3(b)(1), unless the authorization is terminated or revoked sooner. Performed at [facility], [place] [address] [place], [place] [address]   Culture, blood (Routine X 2) w Reflex to ID Panel     Status: None   Collection Time: 09/08/18  2:40 PM   Specimen: BLOOD RIGHT FOREARM  Result Value Ref Range Status   Specimen Description BLOOD RIGHT FOREARM  Final   Special Requests   Final    BOTTLES DRAWN AEROBIC ONLY Blood Culture results may not be optimal due to an inadequate volume of blood received in culture bottles   Culture   Final  NO GROWTH 5 DAYS Performed at [facility], [place] [address], [place], [place] [address]    Report Status 09/13/2018 FINAL  Final  Culture, blood (Routine X 2) w Reflex to ID Panel     Status: None   Collection Time: 09/08/18  3:14 PM   Specimen: BLOOD  Result Value Ref Range Status   Specimen Description BLOOD SITE NOT SPECIFIED  Final   Special Requests   Final    BOTTLES DRAWN AEROBIC AND ANAEROBIC Blood Culture results may not be optimal due to an inadequate volume of blood received in culture bottles   Culture   Final    NO GROWTH 5 DAYS Performed at [facility], [place] [address], [place], [place] [address]    Report Status 09/13/2018 FINAL  Final  SARS Coronavirus 2 [facility] order, Performed in [facility] hospital lab) Nasopharyngeal Nasopharyngeal Swab      Status: None   Collection Time: 09/08/18  4:10 PM   Specimen: Nasopharyngeal Swab  Result Value Ref Range Status   SARS Coronavirus 2 NEGATIVE NEGATIVE Final    Comment: (NOTE) If result is NEGATIVE SARS-CoV-2 target nucleic acids are NOT DETECTED. The SARS-CoV-2 RNA is generally detectable in upper and lower  respiratory specimens during the acute phase of infection. The lowest  concentration of SARS-CoV-2 viral copies this assay can detect is 250  copies / mL. A negative result does not preclude SARS-CoV-2 infection  and should not be used as the sole basis for treatment or other  patient management decisions.  A negative result may occur with  improper specimen collection / handling, submission of specimen other  than nasopharyngeal swab, presence of viral mutation(s) within the  areas targeted by this assay, and inadequate number of viral copies  (<250 copies / mL). A negative result must be combined with clinical  observations, patient history, and epidemiological information. If result is POSITIVE SARS-CoV-2 target nucleic acids are DETECTED. The SARS-CoV-2 RNA is generally detectable in upper and lower  respiratory specimens dur ing the acute phase of infection.  Positive  results are indicative of active infection with SARS-CoV-2.  Clinical  correlation with patient history and other diagnostic information is  necessary to determine patient infection status.  Positive results do  not rule out bacterial infection or co-infection with other viruses. If result is PRESUMPTIVE POSTIVE SARS-CoV-2 nucleic acids MAY BE PRESENT.   A presumptive positive result was obtained on the submitted specimen  and confirmed on repeat testing.  While 2019 novel coronavirus  (SARS-CoV-2) nucleic acids may be present in the submitted sample  additional confirmatory testing may be necessary for epidemiological  and / or clinical management purposes  to differentiate between  SARS-CoV-2 and other  Sarbecovirus currently known to infect humans.  If clinically indicated additional testing with an alternate test  methodology [phone]) is advised. The SARS-CoV-2 RNA is generally  detectable in upper and lower respiratory sp ecimens during the acute  phase of infection. The expected result is Negative. Fact Sheet for Patients:  StrictlyIdeas.no Fact Sheet for Healthcare Providers: BankingDealers.co.za This test is not yet approved or cleared by the [place] FDA and has been authorized for detection and/or diagnosis of SARS-CoV-2 by FDA under an Emergency Use Authorization (EUA).  This EUA will remain in effect (meaning this test can be used) for the duration of the COVID-19 declaration under Section 564(b)(1) of the Act, 21 U.S.C. section 360bbb-3(b)(1), unless the authorization is terminated or revoked sooner. Performed  at [facility], [place] [address], [place], [place] [address]   MRSA PCR Screening     Status: Abnormal   Collection Time: 09/09/18 12:44 AM   Specimen: Nasopharyngeal  Result Value Ref Range Status   MRSA by PCR POSITIVE (A) NEGATIVE Final    Comment: CRITICAL RESULT CALLED TO, READ BACK BY AND VERIFIED WITH: RN Livingston Diones [id number] @[id number]  THANEY Performed at [facility], [address] [place], [place] [address]          Radiology Studies: No results found.      Scheduled Meds: . calcitRIOL  1.5 mcg Oral Q T,Th,Sa-HD  . Chlorhexidine Gluconate Cloth  6 each Topical Q0600  . cinacalcet  60 mg Oral Q T,Th,Sa-HD  . [START ON 09/15/2018] darbepoetin (ARANESP) injection - DIALYSIS  60 mcg Intravenous Q Thu-HD  . docusate sodium  200 mg Oral QHS  . feeding supplement (PRO-STAT SUGAR FREE 64)  30 mL Oral BID  . FLUoxetine  10 mg Oral Daily  . insulin aspart  0-9 Units Subcutaneous TID WC  . insulin aspart  3 Units Subcutaneous TID WC  . insulin detemir  17 Units Subcutaneous Daily  .  ipratropium-albuterol  3 mL Nebulization TID  . levothyroxine  112 mcg Oral QAC breakfast  . mouth rinse  15 mL Mouth Rinse BID  . methylPREDNISolone (SOLU-MEDROL) injection  40 mg Intravenous Q8H  . midodrine  10 mg Oral 3 times per day on Tue Thu Sat  . mometasone-formoterol  2 puff Inhalation BID  . montelukast  10 mg Oral QHS  . rosuvastatin  40 mg Oral Daily  . senna-docusate  1 tablet Oral QHS  . sevelamer carbonate  2,400 mg Oral TID WC  . sodium chloride flush  3 mL Intravenous Q12H   Continuous Infusions: . sodium chloride    . sodium chloride    . sodium chloride       LOS: 6 days     Cordelia Poche, MD [facility] 09/14/2018, 5:45 PM  If 7PM-7AM, please contact night-coverage [email or web address]

## 2018-09-14 NOTE — Progress Notes (Signed)
Renal Navigator received message that transportation application has been received by [facility] and that per [employer], they are awaiting to see what vendor will be able to transport to HD appointments on Saturdays. Renal Navigator has asked to be notified as soon as possible with a start date for transportation services. [facility] has been patient's seat time and schedule.    Alphonzo Cruise, [place] Renal Navigator [phone]

## 2018-09-14 NOTE — Progress Notes (Addendum)
Subjective:  In room "when can I go home" no sob ,tolerated HD yest. On schedule  No at edw   Objective Vital signs in last 24 hours: Vitals:   09/14/18 0500 09/14/18 0737 09/14/18 0804 09/14/18 0807  BP:  134/72    Pulse:      Resp: (!) 22 17    Temp:  (!) 97.5 F (36.4 C)    TempSrc:  Oral    SpO2: 100% 100% 94% 93%  Weight:      Height:       Weight change: -1.4 kg  Physical Exam: General:Alert Obese , elderly female NAD In bed  Heart:RRR, no rub Lungs:Decr BS bases, Non labored breathing Abdomen:Obese, soft , NT, ND Extremities:trace bipedal edema, bilat ruddydiscolorationlower legs  Dialysis Access:Perm cath patent on hd   [facility]  4h  400/1.5  105.5kg  2/2 bath  Hep none  TDC Calcitriol 1.5 mcg PO qHD Sensipar 60 mg PO qHD renvela 800mg  3 tabs AC Midodrine 10 mg PO TIW before HD and mid treatment for SBP <95  Problem/Plan: 1. Weakness, repeated falls: known SDH, debility. Is wheel chair bound  reportedly not ambulating /was Agreeable to SNF now.changing her mind   2. Hypoxia, baseline COPD: Chronic O2 requirement of 5L.CT scan 8/16 =without pulmonary edema.cw copdPrimary treating for COPD.Wean O2 today. / volume overload with Pt fluid noncompliance a factor as witnessed in [place]. Tolerating 2.5  l uf yest and post HD AT EDW   /did have large fluid gain 3.2 kg gain yesterday  since last tx /Again I dw Ms Malia Need to limit total fluids between txs   2. ESRD: TTS. HD per schedule. UF 2.5 L.tolerated yest . No heparin with SDH. STABLE for dc from renal standpoint   3.HTN/volume: Midodrine pre HD.   4. Anemia: Hb 8.7, was to startESA 8/15on 60 mcg Aranesp ?? WasNot given? When reviewed meds today// will need at next hd  5. Secondary hyperparathyroidism:q hdcalcitriol/ sensipar in HD, renvela as binder  6. Nutrition: Albumin 3.1. prostat BID here.   7. Dispo: was agreeable to SNF.and now refusing / [facility]  in [place]. DW family appropriate Transportation  TO op HD   Noted Dr Kruska8/16/20,spoke with her Daughter,[name] via telephone just to let her know updates.  Ernest Haber, PA-C [place] [phone] 09/14/2018,10:57 AM  LOS: 6 days   Pt seen, examined and agree w A/P as above.  Kelly Splinter  MD 09/14/2018, 1:43 PM    Labs: Basic Metabolic Panel: Recent Labs  Lab 09/12/18 0714 09/13/18 0409 09/14/18 0558  NA 131* 130* 137  K 5.1 5.6* 4.6  CL 93* 91* 97*  CO2 22 22 26   GLUCOSE 200* 250* 203*  BUN 70* 98* 45*  CREATININE 6.63* 8.28* 4.48*  CALCIUM 8.6* 8.4* 8.2*   Liver Function Tests: Recent Labs  Lab 09/08/18 1530  AST 10*  ALT 11  ALKPHOS 89  BILITOT 0.7  PROT 6.7  ALBUMIN 3.1*   No results for input(s): LIPASE, AMYLASE in the last 168 hours. No results for input(s): AMMONIA in the last 168 hours. CBC: Recent Labs  Lab 09/08/18 1530  09/09/18 0231 09/10/18 0525 09/10/18 0815 09/13/18 0744  WBC 11.7*  --  10.3 5.6 6.3 13.1*  HGB 8.5*   < > 9.6* 8.9* 8.5* 8.7*  HCT 28.3*   < > 31.3* 28.8* 27.9* 27.9*  MCV 99.6  --  97.5 96.3 97.9 95.9  PLT 151  --  155 148* 169 171   < > = values in this interval not displayed.   Cardiac Enzymes: No results for input(s): CKTOTAL, CKMB, CKMBINDEX, TROPONINI in the last 168 hours. CBG: Recent Labs  Lab 09/12/18 2114 09/13/18 1157 09/13/18 1703 09/13/18 2105 09/14/18 0744  GLUCAP 167* 139* 265* 148* 177*    Studies/Results: No results found. Medications: . sodium chloride    . sodium chloride    . sodium chloride    . azithromycin 500 mg (09/13/18 1451)   . calcitRIOL  1.5 mcg Oral Q T,Th,Sa-HD  . Chlorhexidine Gluconate Cloth  6 each Topical Q0600  . cinacalcet  60 mg Oral Q T,Th,Sa-HD  . darbepoetin (ARANESP) injection - DIALYSIS  60 mcg Intravenous Once  . docusate sodium  200 mg Oral QHS  . feeding supplement (PRO-STAT SUGAR FREE 64)  30 mL Oral BID  . FLUoxetine  10 mg Oral  Daily  . insulin aspart  0-9 Units Subcutaneous TID WC  . insulin aspart  3 Units Subcutaneous TID WC  . insulin detemir  17 Units Subcutaneous Daily  . ipratropium-albuterol  3 mL Nebulization TID  . levothyroxine  112 mcg Oral QAC breakfast  . mouth rinse  15 mL Mouth Rinse BID  . methylPREDNISolone (SOLU-MEDROL) injection  40 mg Intravenous Q8H  . midodrine  10 mg Oral 3 times per day on Tue Thu Sat  . mometasone-formoterol  2 puff Inhalation BID  . montelukast  10 mg Oral QHS  . rosuvastatin  40 mg Oral Daily  . senna-docusate  1 tablet Oral QHS  . sevelamer carbonate  2,400 mg Oral TID WC  . sodium chloride flush  3 mL Intravenous Q12H

## 2018-09-14 NOTE — Progress Notes (Signed)
Physical Therapy Treatment Patient Details Name: Joan Mosley MRN: [id number] DOB: [birth year] Today's Date: 09/14/2018    History of Present Illness Patient is a 73 y/o female presenting with weakness. past medical history significant for ESRD on dialysis Tuesday, Thursday, Saturday, morbid obesity, type 2 diabetes with recent admission for subdural hematoma. Admitted for Acute on chronic hypoxemic respiratory failure.     PT Comments    Pt's son present during treatment and reports patient is either laying/sitting on the couch or on the [facility]. Son reports that his sister and brother physically lift her onto the [facility] or w/c to go for appts. Pt states she never leaves the house. Pt sedentary at baseline however it appears unsafe for the patient and her children due to her size to be depending on physical assist of her children for transfers. Pt is unable to move feet. Pt can stand but not advance feet. Pt to benefit from ST-SNF to increase strength to be able to take a few steps for efficient and safe transfers. Acute PT to cont to follow.    Follow Up Recommendations  SNF;Supervision/Assistance - 24 hour     Equipment Recommendations  None recommended by PT    Recommendations for Other Services       Precautions / Restrictions Precautions Precautions: Fall Restrictions Weight Bearing Restrictions: No    Mobility  Bed Mobility Overal bed mobility: Needs Assistance Bed Mobility: Supine to Sit     Supine to sit: Mod assist;Min assist;+2 for physical assistance     General bed mobility comments: HOB elevated, max directional verbal cues, pt able to move LEs to EOB and pull up on bed rail, modAx2 to scoot/pivot all the way to EOB  Transfers Overall transfer level: Needs assistance Equipment used: (2 person lift with gait belt and pad) Transfers: Sit to/from [employer] Transfers Sit to Stand: Mod assist;Max assist;+2 physical assistance Stand pivot transfers: Max  assist;+2 physical assistance(son to hold chair)       General transfer comment: pt able to power up with LEs blocked however pt unable to move LEs at all, pt max A for weight shift and pivot to chair  Ambulation/Gait             General Gait Details: unable, pt doesn't amb at home   Stairs             Wheelchair Mobility    Modified Rankin (Stroke Patients Only)       Balance Overall balance assessment: Needs assistance Sitting-balance support: Single extremity supported;Feet supported Sitting balance-Leahy Scale: Fair     Standing balance support: Bilateral upper extremity supported Standing balance-Leahy Scale: Poor Standing balance comment: dependent to maintain standing                            Cognition Arousal/Alertness: Awake/alert Behavior During Therapy: WFL for tasks assessed/performed Overall Cognitive Status: Within Functional Limits for tasks assessed                                        Exercises      General Comments General comments (skin integrity, edema, etc.): 11Lo2 via [place], SOB with all activity, HR 122 with standing/std pvt      Pertinent Vitals/Pain Pain Assessment: No/denies pain    Home Living  Prior Function            PT Goals (current goals can now be found in the care plan section) Progress towards PT goals: Progressing toward goals    Frequency    Min 2X/week      PT Plan Current plan remains appropriate    Co-evaluation              AM-PAC PT "6 Clicks" Mobility   Outcome Measure  Help needed turning from your back to your side while in a flat bed without using bedrails?: A Lot Help needed moving from lying on your back to sitting on the side of a flat bed without using bedrails?: A Lot Help needed moving to and from a bed to a chair (including a wheelchair)?: Total Help needed standing up from a chair using your arms (e.g., wheelchair  or bedside chair)?: Total Help needed to walk in hospital room?: Total Help needed climbing 3-5 steps with a railing? : Total 6 Click Score: 8    End of Session Equipment Utilized During Treatment: Oxygen;Gait belt Activity Tolerance: Patient tolerated treatment well Patient left: in chair;with call bell/phone within reach;with family/visitor present Nurse Communication: Mobility status;Need for lift equipment(use bari steady to get pt back to bed) PT Visit Diagnosis: Unsteadiness on feet (R26.81);Repeated falls (R29.6);Muscle weakness (generalized) (M62.81)     Time: [id number] PT Time Calculation (min) (ACUTE ONLY): 30 min  Charges:  $Therapeutic Activity: 23-37 mins                     Joan Mosley, PT, DPT Acute Rehabilitation Services Pager #: [phone] Office #: [phone]    Berline Lopes 09/14/2018, 11:58 AM

## 2018-09-15 ENCOUNTER — Other Ambulatory Visit (HOSPITAL_COMMUNITY): Payer: Medicare Other

## 2018-09-15 ENCOUNTER — Inpatient Hospital Stay (HOSPITAL_COMMUNITY): Payer: Medicare Other

## 2018-09-15 DIAGNOSIS — J9622 Acute and chronic respiratory failure with hypercapnia: Secondary | ICD-10-CM

## 2018-09-15 DIAGNOSIS — J9621 Acute and chronic respiratory failure with hypoxia: Secondary | ICD-10-CM

## 2018-09-15 LAB — BASIC METABOLIC PANEL
Anion gap: 15 (ref 5–15)
BUN: 73 mg/dL — ABNORMAL HIGH (ref 8–23)
CO2: 23 mmol/L (ref 22–32)
Calcium: 7.9 mg/dL — ABNORMAL LOW (ref 8.9–10.3)
Chloride: 94 mmol/L — ABNORMAL LOW (ref 98–111)
Creatinine, Ser: 6.34 mg/dL — ABNORMAL HIGH (ref 0.44–1.00)
GFR calc Af Amer: 7 mL/min — ABNORMAL LOW (ref >60)
GFR calc non Af Amer: 6 mL/min — ABNORMAL LOW (ref >60)
Glucose, Bld: 194 mg/dL — ABNORMAL HIGH (ref 70–99)
Potassium: 5.2 mmol/L — ABNORMAL HIGH (ref 3.5–5.1)
Sodium: 132 mmol/L — ABNORMAL LOW (ref 135–145)

## 2018-09-15 LAB — CBC
HCT: 27.9 % — ABNORMAL LOW (ref 36.0–46.0)
Hemoglobin: 8.5 g/dL — ABNORMAL LOW (ref 12.0–15.0)
MCH: 29.6 pg (ref 26.0–34.0)
MCHC: 30.5 g/dL (ref 30.0–36.0)
MCV: 97.2 fL (ref 80.0–100.0)
Platelets: 198 10*3/uL (ref 150–400)
RBC: 2.87 MIL/uL — ABNORMAL LOW (ref 3.87–5.11)
RDW: 15.8 % — ABNORMAL HIGH (ref 11.5–15.5)
WBC: 16 10*3/uL — ABNORMAL HIGH (ref 4.0–10.5)
nRBC: 0 % (ref 0.0–0.2)

## 2018-09-15 LAB — GLUCOSE, CAPILLARY
Glucose-Capillary: 165 mg/dL — ABNORMAL HIGH (ref 70–99)
Glucose-Capillary: 261 mg/dL — ABNORMAL HIGH (ref 70–99)
Glucose-Capillary: 103 mg/dL — ABNORMAL HIGH (ref 70–99)
Glucose-Capillary: 236 mg/dL — ABNORMAL HIGH (ref 70–99)

## 2018-09-15 MED ORDER — HEPARIN SODIUM (PORCINE) 1000 UNIT/ML IJ SOLN
INTRAMUSCULAR | Status: AC
  Administered 2018-09-15: 1000 [IU]
  Filled 2018-09-15: qty 4

## 2018-09-15 MED ORDER — MIDODRINE HCL 5 MG PO TABS
ORAL_TABLET | ORAL | Status: AC
  Filled 2018-09-15: qty 2

## 2018-09-15 MED ORDER — IPRATROPIUM-ALBUTEROL 0.5-2.5 (3) MG/3ML IN SOLN
RESPIRATORY_TRACT | Status: AC
  Administered 2018-09-15: 3 mL via RESPIRATORY_TRACT
  Filled 2018-09-15: qty 3

## 2018-09-15 MED ORDER — CALCITRIOL 0.5 MCG PO CAPS
ORAL_CAPSULE | ORAL | Status: AC
  Filled 2018-09-15: qty 3

## 2018-09-15 MED ORDER — METHYLPREDNISOLONE SODIUM SUCC 40 MG IJ SOLR: 40.0000 mg | Freq: Every day | INTRAMUSCULAR | Status: DC

## 2018-09-15 MED ORDER — DARBEPOETIN ALFA 60 MCG/0.3ML IJ SOSY
PREFILLED_SYRINGE | INTRAMUSCULAR | Status: AC
  Filled 2018-09-15: qty 0.3

## 2018-09-15 NOTE — Progress Notes (Addendum)
Subjective: For Hemodialysis  Today  ,noted Pulm seeing Today eval her hypoxia with COPD /again at bedside multiple fluid cups dw RN and renal 1200cc fluids restrictions ordered  /Pt denying  sob asking about Dc, she does not comprehend her fluid restrictions   Objective Vital signs in last 24 hours: Vitals:   09/15/18 0416 09/15/18 0748 09/15/18 0749 09/15/18 0905  BP: 140/82   134/79  Pulse: 69     Resp: 18   13  Temp: 98.1 F (36.7 C)   97.9 F (36.6 C)  TempSrc: Oral   Oral  SpO2: 97% 93% 94% 91%  Weight:      Height:       Weight change:   Physical Exam: General:Alert Obese , elderly female NAD In bed  Heart:RRR, no rub Lungs:Decr BS bases, Non labored breathing Abdomen:Obese, soft , NT, ND Extremities:trace bipedal edema, bilat ruddydiscolorationlower legs  Dialysis Access:Perm cathpatent on hd  Dialysis: [facility]  4h  400/1.5  105.5kg  2/2 bath  Hep none  TDC Calcitriol 1.5 mcg PO qHD Sensipar 60 mg PO qHD renvela 800mg  3 tabs AC Midodrine 10 mg PO TIW before HD and mid treatment for SBP <95   CXR 8/14 - bibasilar atx, CM  CT chest 8/16 - IMPRESSION: 1. No pulmonary embolism seen. Chronic pulmonary artery hypertension. 2. Small bibasilar consolidations, RIGHT greater than LEFT, likely chronic atelectasis. 3. Cardiomegaly. Diffuse coronary artery calcifications.  Problem/Plan: 1. Weakness, repeated falls: known SDH, debility. Is wheel chair bound  reportedly not ambulating /wasAgreeable to SNF now.changing her mind  2. Hypoxia, baseline COPD: Chronic O2 requirement of 5L./CTchest  8/16 =no pulmonary edema.cw copdPrimary treating for COPD.and now CCM seeing /trying to wean  O2  And uf 3-3.5 l hd today . / volume overload with Pt fluid noncompliance a factor as witnessed in [place]. Tolerating  uf in hosp /bed wts  not dependable .try to get [facility] lift wts   2. ESRD: TTS. HD per schedule. UF 2.5 L.tolerated last hd yest . No heparin with  SDH.   3.HTN/volume: Midodrine pre HD.   4. Anemia: Hb 8.7, was to startESA 8/15on 60 mcg Aranesp ?? WasNot given?  will need at next hd today   5. Secondary hyperparathyroidism:q hdcalcitriol/ sensipar in HD, renvela as binder last phos 6.8   6. Nutrition: Albumin 3.1. prostat BID here.   7. Dispo:wasagreeable to SNF.and now refusing/   Ernest Haber, PA-C [place] [phone] 09/15/2018,10:02 AM  LOS: 7 days   Pt seen, examined and agree w A/P as above. Still on high O2, will repeat CXR, no CHF on initial films and CT.  Kelly Splinter  MD 09/15/2018, 11:24 AM    Labs: Basic Metabolic Panel: Recent Labs  Lab 09/13/18 0409 09/14/18 0558 09/15/18 0429  NA 130* 137 132*  K 5.6* 4.6 5.2*  CL 91* 97* 94*  CO2 22 26 23   GLUCOSE 250* 203* 194*  BUN 98* 45* 73*  CREATININE 8.28* 4.48* 6.34*  CALCIUM 8.4* 8.2* 7.9*   Liver Function Tests: Recent Labs  Lab 09/08/18 1530  AST 10*  ALT 11  ALKPHOS 89  BILITOT 0.7  PROT 6.7  ALBUMIN 3.1*   No results for input(s): LIPASE, AMYLASE in the last 168 hours. No results for input(s): AMMONIA in the last 168 hours. CBC: Recent Labs  Lab 09/08/18 1530  09/09/18 0231 09/10/18 0525 09/10/18 0815 09/13/18 0744  WBC 11.7*  --  10.3 5.6 6.3 13.1*  HGB 8.5*   < > 9.6* 8.9* 8.5* 8.7*  HCT 28.3*   < > 31.3* 28.8* 27.9* 27.9*  MCV 99.6  --  97.5 96.3 97.9 95.9  PLT 151  --  155 148* 169 171   < > = values in this interval not displayed.   Cardiac Enzymes: No results for input(s): CKTOTAL, CKMB, CKMBINDEX, TROPONINI in the last 168 hours. CBG: Recent Labs  Lab 09/14/18 1110 09/14/18 1311 09/14/18 1649 09/14/18 2127 09/15/18 0812  GLUCAP 171* 248* 195* 158* 165*    Studies/Results: No results found. Medications: . sodium chloride    . sodium chloride    . sodium chloride     . calcitRIOL  1.5 mcg Oral Q T,Th,Sa-HD  . Chlorhexidine Gluconate Cloth  6 each Topical Q0600  .  cinacalcet  60 mg Oral Q T,Th,Sa-HD  . darbepoetin (ARANESP) injection - DIALYSIS  60 mcg Intravenous Q Thu-HD  . docusate sodium  200 mg Oral QHS  . feeding supplement (PRO-STAT SUGAR FREE 64)  30 mL Oral BID  . FLUoxetine  10 mg Oral Daily  . insulin aspart  0-9 Units Subcutaneous TID WC  . insulin aspart  3 Units Subcutaneous TID WC  . insulin detemir  17 Units Subcutaneous Daily  . ipratropium-albuterol  3 mL Nebulization TID  . levothyroxine  112 mcg Oral QAC breakfast  . mouth rinse  15 mL Mouth Rinse BID  . [START ON 09/16/2018] methylPREDNISolone (SOLU-MEDROL) injection  40 mg Intravenous Daily  . midodrine  10 mg Oral 3 times per day on Tue Thu Sat  . mometasone-formoterol  2 puff Inhalation BID  . montelukast  10 mg Oral QHS  . rosuvastatin  40 mg Oral Daily  . senna-docusate  1 tablet Oral QHS  . sevelamer carbonate  2,400 mg Oral TID WC  . sodium chloride flush  3 mL Intravenous Q12H

## 2018-09-15 NOTE — Progress Notes (Signed)
PROGRESS NOTE    Imonie Spell  [id number] DOB: 18-Oct-1945 DOA: 09/08/2018 PCP: Flossie Buffy, NP   Brief Narrative: Joan Mosley is a 73 y.o. female with past medical history significant for ESRD on dialysis Tuesday, Thursday, Saturday, morbid obesity, type 2 diabetes with recent admission for subdural hematoma and discharged home on 8/12 after refusing SNF.  She returns to the hospital with chief complaint of weakness, lethargy, as well as worsening acute on chronic hypoxemic respiratory failure. Rapid response called 8/14 AM, multiple desaturation events.  She was de-satting into the 70s on 6 L nasal cannula, increased to nonrebreather to maintain saturation.  She was started on BiPAP, weaned down to nonrebreather and high flow nasal cannula O2.  CTA completed which ruled out for PE.   Assessment & Plan:   Active Problems:   Pulmonary edema   Respiratory failure ([place])   COPD exacerbation COVID-19 negative. Chest x-ray significant for peribronchial airspace consolidation. Initially on Bipap now on HFNC. No PE on CTA. Patient followed with pulmonologist in [place] prior to move to [employer], decrease steroids to daily dosing and eventual wean off -Consult pulmonology today  Acute on chronic respiratory failure with hypoxia Baseline oxygen use of 5L via nasal canula. Currently needing 11 L via HFNC at rest. patient is nonambulatory. Likely multifactorial. -Wean as able -Pulmonology recommendations   ESRD on HD -Nephrology management  Hyperkalemia -Management with HD  Hypotension -Continue midodrine  Diabetes mellitus, type 2 -Continue Levemir and SSI  Hypothyroidism -Continue Synthroid  Hyperlipidemia -Continue Crestor  SDH Stable.  Demand ischemia No chest pain.  Depression/anxiety -Continue Prozac and Klonopin   DVT prophylaxis: SCDs Code Status:   Code Status: Full Code Family Communication: None Disposition Plan:  Discharge home (patient/family declining SNF) pending wean to baseline oxygen and pending arrangements for patient to undergo outpatient HD   Consultants:   None  Procedures:   BiPAP  Antimicrobials:  Azithromycin   Subjective: No chest pain or dyspnea.  Objective: Vitals:   09/14/18 2324 09/15/18 0416 09/15/18 0748 09/15/18 0749  BP: 139/71 140/82    Pulse: 69 69    Resp: 17 18    Temp: 98 F (36.7 C) 98.1 F (36.7 C)    TempSrc: Oral Oral    SpO2: 96% 97% 93% 94%  Weight:      Height:       No intake or output data in the 24 hours ending 09/15/18 0754 Filed Weights   09/10/18 1203 09/13/18 0644 09/13/18 0712  Weight: 106.8 kg 110 kg 108.6 kg    Examination:  General exam: Appears calm and comfortable Respiratory system: Decreased breath sounds, no wheezing. Respiratory effort normal. Cardiovascular system: S1 & S2 heard, RRR. No murmurs, rubs, gallops or clicks. Gastrointestinal system: Abdomen is nondistended, soft and nontender. No organomegaly or masses felt. Normal bowel sounds heard. Central nervous system: Alert and oriented. No focal neurological deficits. Extremities: No edema. No calf tenderness Skin: No cyanosis. No rashes Psychiatry: Judgement and insight appear impaired.     Data Reviewed: I have personally reviewed following labs and imaging studies  CBC: Recent Labs  Lab 09/08/18 1530 09/08/18 1541 09/09/18 0231 09/10/18 0525 09/10/18 0815 09/13/18 0744  WBC 11.7*  --  10.3 5.6 6.3 13.1*  HGB 8.5* 11.6* 9.6* 8.9* 8.5* 8.7*  HCT 28.3* 34.0* 31.3* 28.8* 27.9* 27.9*  MCV 99.6  --  97.5 96.3 97.9 95.9  PLT 151  --  155 148* 169 171  Basic Metabolic Panel: Recent Labs  Lab 09/11/18 0617 09/12/18 0714 09/13/18 0409 09/14/18 0558 09/15/18 0429  NA 135 131* 130* 137 132*  K 4.6 5.1 5.6* 4.6 5.2*  CL 96* 93* 91* 97* 94*  CO2 24 22 22 26 23   GLUCOSE 204* 200* 250* 203* 194*  BUN 40* 70* 98* 45* 73*  CREATININE 4.57* 6.63* 8.28*  4.48* 6.34*  CALCIUM 9.1 8.6* 8.4* 8.2* 7.9*   GFR: Estimated Creatinine Clearance: 8.7 mL/min (A) (by C-G formula based on SCr of 6.34 mg/dL (H)). Liver Function Tests: Recent Labs  Lab 09/08/18 1530  AST 10*  ALT 11  ALKPHOS 89  BILITOT 0.7  PROT 6.7  ALBUMIN 3.1*   No results for input(s): LIPASE, AMYLASE in the last 168 hours. No results for input(s): AMMONIA in the last 168 hours. Coagulation Profile: Recent Labs  Lab 09/08/18 1530  INR 1.1   Cardiac Enzymes: No results for input(s): CKTOTAL, CKMB, CKMBINDEX, TROPONINI in the last 168 hours. BNP (last 3 results) No results for input(s): PROBNP in the last 8760 hours. HbA1C: No results for input(s): HGBA1C in the last 72 hours. CBG: Recent Labs  Lab 09/14/18 0744 09/14/18 1110 09/14/18 1311 09/14/18 1649 09/14/18 2127  GLUCAP 177* 171* 248* 195* 158*   Lipid Profile: No results for input(s): CHOL, HDL, LDLCALC, TRIG, CHOLHDL, LDLDIRECT in the last 72 hours. Thyroid Function Tests: No results for input(s): TSH, T4TOTAL, FREET4, T3FREE, THYROIDAB in the last 72 hours. Anemia Panel: No results for input(s): VITAMINB12, FOLATE, FERRITIN, TIBC, IRON, RETICCTPCT in the last 72 hours. Sepsis Labs: Recent Labs  Lab 09/08/18 1530  LATICACIDVEN 0.8    Recent Results (from the past 240 hour(s))  SARS CORONAVIRUS 2 Nasal Swab Aptima Multi Swab     Status: None   Collection Time: 09/06/18  9:16 AM   Specimen: Aptima Multi Swab; Nasal Swab  Result Value Ref Range Status   SARS Coronavirus 2 NEGATIVE NEGATIVE Final    Comment: (NOTE) SARS-CoV-2 target nucleic acids are NOT DETECTED. The SARS-CoV-2 RNA is generally detectable in upper and lower respiratory specimens during the acute phase of infection. Negative results do not preclude SARS-CoV-2 infection, do not rule out co-infections with other pathogens, and should not be used as the sole basis for treatment or other patient management decisions. Negative  results must be combined with clinical observations, patient history, and epidemiological information. The expected result is Negative. Fact Sheet for Patients: SugarRoll.be Fact Sheet for Healthcare Providers: https://www.woods-mathews.com/ This test is not yet approved or cleared by the [place] FDA and  has been authorized for detection and/or diagnosis of SARS-CoV-2 by FDA under an Emergency Use Authorization (EUA). This EUA will remain  in effect (meaning this test can be used) for the duration of the COVID-19 declaration under Section 56 4(b)(1) of the Act, 21 U.S.C. section 360bbb-3(b)(1), unless the authorization is terminated or revoked sooner. Performed at [facility], [place] [address] [place], [place] [address]   Culture, blood (Routine X 2) w Reflex to ID Panel     Status: None   Collection Time: 09/08/18  2:40 PM   Specimen: BLOOD RIGHT FOREARM  Result Value Ref Range Status   Specimen Description BLOOD RIGHT FOREARM  Final   Special Requests   Final    BOTTLES DRAWN AEROBIC ONLY Blood Culture results may not be optimal due to an inadequate volume of blood received in culture bottles   Culture   Final  NO GROWTH 5 DAYS Performed at [facility], [place] [address] [place], [place] [address]    Report Status 09/13/2018 FINAL  Final  Culture, blood (Routine X 2) w Reflex to ID Panel     Status: None   Collection Time: 09/08/18  3:14 PM   Specimen: BLOOD  Result Value Ref Range Status   Specimen Description BLOOD SITE NOT SPECIFIED  Final   Special Requests   Final    BOTTLES DRAWN AEROBIC AND ANAEROBIC Blood Culture results may not be optimal due to an inadequate volume of blood received in culture bottles   Culture   Final    NO GROWTH 5 DAYS Performed at [facility], [place] [address], [place], [place] [address]    Report Status 09/13/2018 FINAL  Final  SARS Coronavirus 2 [facility] order, Performed in  [facility] hospital lab) Nasopharyngeal Nasopharyngeal Swab     Status: None   Collection Time: 09/08/18  4:10 PM   Specimen: Nasopharyngeal Swab  Result Value Ref Range Status   SARS Coronavirus 2 NEGATIVE NEGATIVE Final    Comment: (NOTE) If result is NEGATIVE SARS-CoV-2 target nucleic acids are NOT DETECTED. The SARS-CoV-2 RNA is generally detectable in upper and lower  respiratory specimens during the acute phase of infection. The lowest  concentration of SARS-CoV-2 viral copies this assay can detect is 250  copies / mL. A negative result does not preclude SARS-CoV-2 infection  and should not be used as the sole basis for treatment or other  patient management decisions.  A negative result may occur with  improper specimen collection / handling, submission of specimen other  than nasopharyngeal swab, presence of viral mutation(s) within the  areas targeted by this assay, and inadequate number of viral copies  (<250 copies / mL). A negative result must be combined with clinical  observations, patient history, and epidemiological information. If result is POSITIVE SARS-CoV-2 target nucleic acids are DETECTED. The SARS-CoV-2 RNA is generally detectable in upper and lower  respiratory specimens dur ing the acute phase of infection.  Positive  results are indicative of active infection with SARS-CoV-2.  Clinical  correlation with patient history and other diagnostic information is  necessary to determine patient infection status.  Positive results do  not rule out bacterial infection or co-infection with other viruses. If result is PRESUMPTIVE POSTIVE SARS-CoV-2 nucleic acids MAY BE PRESENT.   A presumptive positive result was obtained on the submitted specimen  and confirmed on repeat testing.  While 2019 novel coronavirus  (SARS-CoV-2) nucleic acids may be present in the submitted sample  additional confirmatory testing may be necessary for epidemiological  and / or clinical  management purposes  to differentiate between  SARS-CoV-2 and other Sarbecovirus currently known to infect humans.  If clinically indicated additional testing with an alternate test  methodology [phone]) is advised. The SARS-CoV-2 RNA is generally  detectable in upper and lower respiratory sp ecimens during the acute  phase of infection. The expected result is Negative. Fact Sheet for Patients:  StrictlyIdeas.no Fact Sheet for Healthcare Providers: BankingDealers.co.za This test is not yet approved or cleared by the [place] FDA and has been authorized for detection and/or diagnosis of SARS-CoV-2 by FDA under an Emergency Use Authorization (EUA).  This EUA will remain in effect (meaning this test can be used) for the duration of the COVID-19 declaration under Section 564(b)(1) of the Act, 21 U.S.C. section 360bbb-3(b)(1), unless the authorization is terminated or revoked sooner. Performed  at [facility], [place] [address], [place], [place] [address]   MRSA PCR Screening     Status: Abnormal   Collection Time: 09/09/18 12:44 AM   Specimen: Nasopharyngeal  Result Value Ref Range Status   MRSA by PCR POSITIVE (A) NEGATIVE Final    Comment: CRITICAL RESULT CALLED TO, READ BACK BY AND VERIFIED WITH: RN Livingston Diones [id number] @[id number]  THANEY Performed at [facility], [address] [place], [place] [address]          Radiology Studies: No results found.      Scheduled Meds: . calcitRIOL  1.5 mcg Oral Q T,Th,Sa-HD  . Chlorhexidine Gluconate Cloth  6 each Topical Q0600  . cinacalcet  60 mg Oral Q T,Th,Sa-HD  . darbepoetin (ARANESP) injection - DIALYSIS  60 mcg Intravenous Q Thu-HD  . docusate sodium  200 mg Oral QHS  . feeding supplement (PRO-STAT SUGAR FREE 64)  30 mL Oral BID  . FLUoxetine  10 mg Oral Daily  . insulin aspart  0-9 Units Subcutaneous TID WC  . insulin aspart  3 Units Subcutaneous TID WC  . insulin  detemir  17 Units Subcutaneous Daily  . ipratropium-albuterol  3 mL Nebulization TID  . levothyroxine  112 mcg Oral QAC breakfast  . mouth rinse  15 mL Mouth Rinse BID  . [START ON 09/16/2018] methylPREDNISolone (SOLU-MEDROL) injection  40 mg Intravenous Daily  . midodrine  10 mg Oral 3 times per day on Tue Thu Sat  . mometasone-formoterol  2 puff Inhalation BID  . montelukast  10 mg Oral QHS  . rosuvastatin  40 mg Oral Daily  . senna-docusate  1 tablet Oral QHS  . sevelamer carbonate  2,400 mg Oral TID WC  . sodium chloride flush  3 mL Intravenous Q12H   Continuous Infusions: . sodium chloride    . sodium chloride    . sodium chloride       LOS: 7 days     Cordelia Poche, MD [facility] 09/15/2018, 7:54 AM  If 7PM-7AM, please contact night-coverage [email or web address]

## 2018-09-15 NOTE — Progress Notes (Signed)
Renal Navigator left message for [facility] to follow up on transportation to OP HD appointments. Renal Navigator will continue to follow closely as this needs to be arranged before patient can have a safe discharge plan.  Alphonzo Cruise, [place] Renal Navigator [phone]

## 2018-09-15 NOTE — Progress Notes (Signed)
Report given to dialysis nurse. Pt off floor to dialysis.

## 2018-09-15 NOTE — Progress Notes (Signed)
Renal Navigator has received confirmation from [facility] that patient's transportation to OP HD appointments has been arranged and is available as soon as needed by calling [phone]. Rides must be requested by noon the day before the appointment. Renal Navigator informed patient that transportation has been arranged. She is appreciative. Renal Navigator called patient's daughter, with patient's permission to inform. She did not answer, but message was left with phone number to call to arrange rides. Renal Navigator sent message to OP HD clinic/[facility] to inform that [facility] has been arranged for patient in order to provide continuity of care and follow up. Renal Navigator notified hospital CSW/L. [place] and requested that transportation information be documented on patient's AVS. Renal Navigator notified Renal PA/D. Zeyfang that transportation has been arranged.  Alphonzo Cruise, [place] Renal Navigator [phone]

## 2018-09-15 NOTE — Progress Notes (Signed)
OT Cancellation Note    09/15/18 1400  OT Visit Information  Last OT Received On 09/15/18  Reason Eval/Treat Not Completed Patient at procedure or test/ unavailable (HD)  Maurie Boettcher, OT/L   Acute OT Clinical Specialist [place] Pager [phone] Office [phone]

## 2018-09-15 NOTE — Consult Note (Addendum)
NAME:  Joan Mosley, MRN:  [id number], DOB:  1945/10/26, LOS: 7 ADMISSION DATE:  09/08/2018, CONSULTATION DATE:  09/15/18 REFERRING MD:  Dr. Lonny Prude, CHIEF COMPLAINT:  Weakness.     Brief History   73 y/o F admitted 8/13 with of weakness.  Found to have multi-factorial acute on chronic hypoxemic respiratory failure.   History of present illness   73 y/o F who presented to [facility] on 8/13 with reports of weakness.  shortness of breath.    The patient was recently admitted from 8/11-8/12 with SDH after a fall.  She was recommended for SNF at that time but patient refused requesting home health.   At baseline, she is a former Geophysicist/field seismologist.  She is from [place] and was in the process of moving to [facility] in March 2020 when she "got stuck here because of the virus".  She states she lives here in [place].  She reports she has never been intubated. 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  She is a former smoker (quit 1983) and her husband was a smoker.  She currently lives with a smoker.  The patient wears 5-6L O2 at home.  She is mostly wheelchair bound and is unable to walk independently - she relays due to arthritis.  She has difficulty getting back and forth to dialysis due to mobility issues.  She denies daily cough / sputum production.    The patient was admitted with reports of weakness on 8/13.  She missed her hemodialysis the Tues prior to admit.  EMS found the patient to have saturations of 89% and she was treated with 10L O2. CXR on admit showed bibasilar atelectasis and she was empirically treated with IV antibiotics.  Additionally, nebulized bronchodilators & IV steroids were initiated for possible COPD exacerbation.  CTA of the chest was negative for PE but showed chronic bibasilar atelectasis, pulmonary artery enlargement and background emphysema.  Nephrology consulted for HD.  She remained on salter [place] at 12L.  PCCM consulted for evaluation of hypoxic respiratory failure.   Past Medical History   Morbid Obesity - BMI 45  ESRD - HD T,Th,S DM II  SDH - discharged 09/07/18 Chronic Hypotension - on midodrine  COPD  Former Smoker - quit 1983, > 1ppd for 22 years. Husband was a smoker and currently lives with a smoker.   Significant Hospital Events   8/13 Admit  8/20 PCCM consulted   Consults:  PCCM 8/20   Procedures:    Significant Diagnostic Tests:  CT Head w/o 8/13 >> no changes, mixed density bilateral convexity subdural hematomas CTA Chest 8/16 >> negative for PE, chronic pulmonary hypertension, small bibasilar consolidations R>L, likely chronic atelectasis, cardiomegaly  Micro Data:  COVID 8/13 >> negative  MRSA PCR 8/14 >> positive  BCx2 8/13 >> negative   Antimicrobials:    Interim history/subjective:  As above.  Pt denies fevers, chills, n/v/d, sputum production / cough.   Objective   Blood pressure 140/82, pulse 69, temperature 98.1 F (36.7 C), temperature source Oral, resp. rate 18, height 5\' 1"  (1.549 m), weight 108.6 kg, SpO2 94 %.       No intake or output data in the 24 hours ending 09/15/18 0809 Filed Weights   09/10/18 1203 09/13/18 0644 09/13/18 [id number]  Weight: 106.8 kg 110 kg 108.6 kg    Examination: General: deconditioned morbidly obese female lying in bed in NAD  HEENT: MM pink/moist, missing most of teeth, few broken teeth remain Neuro: Awake, alert, oriented to  self, place, events, repetitively asking to go home, MAE, generalized weakness  CV: s1s2 rrr, no m/r/g PULM:  Mild dyspnea with conversation, lungs bilaterally clear anterior, diminished bases  GI: soft, bsx4 active  Extremities: warm/dry, trace pre-tibial edema, RLE wrapped in gauze Skin: no rashes or lesions  Resolved Hospital Problem list      Assessment & Plan:   Acute on Chronic Hypoxic Respiratory Failure  -baseline 5-6L dependent  - suspect multi factorial in the setting of emphysema / COPD, morbid obesity with chronic atelectasis, possible OSA and 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 It is difficult to assess her baseline COPD burden due to her limited physical activity.  Note she does have coronary artery calcifications on CTA and mild LVH, diastolic dysfunction on ECHO.   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, 3 P: No role for pulmonary vasodilators Optimize underlying disease processes / symptom burden  Suspected OSA  -no evidence to suggest OHS, no hypercarbia on prior ABG's  P: Continue positive pressure while inpatient  Will likely need outpatient sleep study  Morbid Obesity with Chronic Atelectasis  P: Mobilize as able  PT efforts   Multiple Falls Adult Failure to Thrive  P: Consider palliative care for goals of care discussions   Best practice:  Diet: Carb Modified  Pain/Anxiety/Delirium protocol (if indicated): n/a VAP protocol (if indicated): n/a  DVT prophylaxis: on hold due to recent SDH  GI prophylaxis: n/a  Glucose control: n/a  Mobility: As tolerated  Code Status: Full Code.  However, patient did indicate in discussion she would not want to be intubated.  When questioned further, she did state she "wanted to live" in response to CPR questioning.  She did not understand that the two would come together in the event of CPR.  Family Communication: Per Primary  Disposition: Per Primary   Labs   CBC: Recent Labs  Lab 09/08/18 1530 09/08/18 1541 09/09/18 0231 09/10/18 0525 09/10/18 0815 09/13/18 0744  WBC 11.7*  --  10.3 5.6 6.3 13.1*  HGB 8.5* 11.6* 9.6* 8.9* 8.5* 8.7*  HCT 28.3* 34.0* 31.3* 28.8* 27.9* 27.9*  MCV 99.6  --  97.5 96.3 97.9 95.9  PLT 151  --  155 148* 169 [id number]    Basic Metabolic Panel: Recent Labs  Lab 09/11/18 0617 09/12/18 0714 09/13/18 0409 09/14/18 0558 09/15/18 0429  NA 135 131*  130* 137 132*  K 4.6 5.1 5.6* 4.6 5.2*  CL 96* 93* 91* 97* 94*  CO2 24 22 22 26 23   GLUCOSE 204* 200* 250* 203* 194*  BUN 40* 70* 98* 45* 73*  CREATININE 4.57* 6.63* 8.28* 4.48* 6.34*  CALCIUM 9.1 8.6* 8.4* 8.2* 7.9*   GFR: Estimated Creatinine Clearance: 8.7 mL/min (A) (by C-G formula based on SCr of 6.34 mg/dL (H)). Recent Labs  Lab 09/08/18 1530 09/09/18 0231 09/10/18 0525 09/10/18 0815 09/13/18 0744  WBC 11.7* 10.3 5.6 6.3 13.1*  LATICACIDVEN 0.8  --   --   --   --     Liver Function Tests: Recent Labs  Lab 09/08/18 1530  AST 10*  ALT 11  ALKPHOS 89  BILITOT 0.7  PROT 6.7  ALBUMIN 3.1*   No results for input(s): LIPASE, AMYLASE in the last 168 hours. No results for input(s): AMMONIA  in the last 168 hours.  ABG    Component Value Date/Time   PHART 7.439 08/22/2018 2015   PCO2ART 41.5 08/22/2018 2015   PO2ART 122.0 (H) 08/22/2018 2015   HCO3 30.4 (H) 09/08/2018 1541   TCO2 32 09/08/2018 1541   ACIDBASEDEF 2.0 08/01/2018 0521   O2SAT 98.0 09/08/2018 1541     Coagulation Profile: Recent Labs  Lab 09/08/18 1530  INR 1.1    Cardiac Enzymes: No results for input(s): CKTOTAL, CKMB, CKMBINDEX, TROPONINI in the last 168 hours.  HbA1C: Hgb A1c MFr Bld  Date/Time Value Ref Range Status  07/01/2018 11:49 AM 6.1 (H) <5.7 % of total Hgb Final    Comment:    For someone without known diabetes, a hemoglobin  A1c value between 5.7% and 6.4% is consistent with prediabetes and should be confirmed with a  follow-up test. . For someone with known diabetes, a value <7% indicates that their diabetes is well controlled. A1c targets should be individualized based on duration of diabetes, age, comorbid conditions, and other considerations. . This assay result is consistent with an increased risk of diabetes. . Currently, no consensus exists regarding use of hemoglobin A1c for diagnosis of diabetes for children. .     CBG: Recent Labs  Lab 09/14/18 0744  09/14/18 1110 09/14/18 1311 09/14/18 1649 09/14/18 2127  GLUCAP 177* 171* 248* 195* 158*    Review of Systems: Positives in [place]   Gen: Denies fever, chills, weight change, fatigue, night sweats HEENT: Denies blurred vision, double vision, hearing loss, tinnitus, sinus congestion, rhinorrhea, sore throat, neck stiffness, dysphagia PULM: Denies shortness of breath, cough, sputum production, hemoptysis, wheezing CV: Denies chest pain, edema, orthopnea, paroxysmal nocturnal dyspnea, palpitations GI: Denies abdominal pain, nausea, vomiting, diarrhea, hematochezia, melena, constipation, change in bowel habits GU: Denies dysuria, hematuria, polyuria, oliguria, urethral discharge Endocrine: Denies hot or cold intolerance, polyuria, polyphagia or appetite change Derm: Denies rash, dry skin, scaling or peeling skin change Heme: Denies easy bruising, bleeding, bleeding gums Neuro: Denies headache, numbness, weakness, slurred speech, loss of memory or consciousness   Past Medical History  She,  has a past medical history of Arthritis, COPD (chronic obstructive pulmonary disease) ([place]), Diabetes mellitus without complication ([place]), Diabetic neuropathy ([place]), Oxygen dependent (03/30/2018), Renal disorder, Thyroid disease, and TIA (transient ischemic attack).   Surgical History    Past Surgical History:  Procedure Laterality Date  . ABDOMINAL HYSTERECTOMY    . RECTOCELE REPAIR    . REPLACEMENT TOTAL KNEE BILATERAL Bilateral 2008  . THYROIDECTOMY     80%  . VAGINAL WOUND CLOSURE / REPAIR       Social History   reports that she quit smoking about 37 years ago. She has a 35.00 pack-year smoking history. She has never used smokeless tobacco. She reports that she does not drink alcohol or use drugs.   Family History   Her family history includes Heart disease in her father and mother; Hypertension in her father and mother.   Allergies Allergies  Allergen Reactions  . Avelox [Moxifloxacin  Hcl In Nacl]     PT does not remember  . Codeine Anaphylaxis    PT does not remember  . Other     PT does not remember  . Penicillins     Rash  . Sulfa Antibiotics     PT does nolt remember  . Shellfish Allergy Hives, Itching and Rash     Home Medications  Prior to Admission medications   Medication Sig Start  Date End Date Taking? Authorizing Provider  acetaminophen (TYLENOL) 500 MG tablet Take 1,000 mg by mouth every 6 (six) hours as needed for mild pain.   Yes [provider]  albuterol (VENTOLIN HFA) 108 (90 Base) MCG/ACT inhaler Inhale 2 puffs into the lungs every 6 (six) hours as needed for wheezing or shortness of breath.   Yes [provider]  calcitRIOL (ROCALTROL) 0.25 MCG capsule Take 0.25 mcg by mouth daily.   Yes [provider]  clonazePAM (KLONOPIN) 1 MG tablet Take 1 tablet (1 mg total) by mouth 2 (two) times daily as needed for anxiety. Change in dose 08/08/18  Yes Nche, Charlene Brooke, NP  FLUoxetine (PROZAC) 10 MG tablet Take 1 tablet (10 mg total) by mouth daily. 08/08/18  Yes Nche, Charlene Brooke, NP  furosemide (LASIX) 80 MG tablet Take 80 mg by mouth See admin instructions. Sunday, Monday,Wed,Friday--in AM    Yes [provider]  insulin detemir (LEVEMIR) 100 UNIT/ML injection Inject 0.15 mLs (15 Units total) into the skin daily. 07/04/18  Yes Nche, Charlene Brooke, NP  insulin lispro (HUMALOG) 100 UNIT/ML injection Inject 1-5 Units into the skin 3 (three) times daily with meals as needed for high blood sugar. Sliding scale    Yes [provider]  ipratropium (ATROVENT) 0.02 % nebulizer solution Inhale 0.5 mg into the lungs 4 (four) times daily.    Yes [provider]  levothyroxine (SYNTHROID) 112 MCG tablet Take 1 tablet (112 mcg total) by mouth daily before breakfast. 1 AM Patient taking differently: Take 112 mcg by mouth daily before breakfast.  07/04/18  Yes Nche, Charlene Brooke, NP  lidocaine (LIDODERM) 5 % Place 1  patch onto the skin every 12 (twelve) hours as needed (back pain). Remove & Discard patch within 12 hours or as directed by MD    Yes [provider]  midodrine (PROAMATINE) 10 MG tablet Take 10 mg by mouth 3 (three) times daily. Taking on Dialysis days, Tues, Thur, Sat.   Yes [provider]  montelukast (SINGULAIR) 10 MG tablet Take 10 mg by mouth at bedtime. PM   Yes [provider]  nystatin (MYCOSTATIN/NYSTOP) powder Apply 1 g topically 2 (two) times daily as needed (skin rash).    Yes [provider]  nystatin cream (MYCOSTATIN) Apply 1 application topically 2 (two) times daily as needed for dry skin.    Yes [provider]  rosuvastatin (CRESTOR) 40 MG tablet Take 40 mg by mouth daily. AM   Yes [provider]  senna-docusate (SENNA PLUS) 8.6-50 MG tablet Take 1 tablet by mouth at bedtime. 07/04/18  Yes Nche, Charlene Brooke, NP  sevelamer (RENAGEL) 800 MG tablet Take 1,600 mg by mouth 3 (three) times daily with meals. 1600 mg --3 times daily--take additional 1-2 if eats snack   Yes [provider]     Critical care time: n/a     Noe Gens, NP-C [facility] Pgr: [phone] or if no answer [phone] 09/15/2018, 8:09 AM

## 2018-09-15 NOTE — Progress Notes (Signed)
Pt sats maintaining in the 70s on HFNC at 8L. Breathing is symmetrical, labored, and rapid at 22 per minute. Is upset about going home and difficult to refocus. C/O SOB. No change in lung sounds from previous assessment. RT called and at bedside. Given klonopin for anxiety. Repositioned pt and increased oxygen back to 11L HFNC. Pt responded well and resting quietly. Call bell and reach and monitoring from doorway.

## 2018-09-16 ENCOUNTER — Inpatient Hospital Stay (HOSPITAL_COMMUNITY): Payer: Medicare Other

## 2018-09-16 ENCOUNTER — Inpatient Hospital Stay: Payer: Medicare Other | Admitting: Nurse Practitioner

## 2018-09-16 DIAGNOSIS — Z515 Encounter for palliative care: Secondary | ICD-10-CM

## 2018-09-16 DIAGNOSIS — Q211 Atrial septal defect: Secondary | ICD-10-CM

## 2018-09-16 DIAGNOSIS — Z7189 Other specified counseling: Secondary | ICD-10-CM

## 2018-09-16 LAB — GLUCOSE, CAPILLARY
Glucose-Capillary: 105 mg/dL — ABNORMAL HIGH (ref 70–99)
Glucose-Capillary: 147 mg/dL — ABNORMAL HIGH (ref 70–99)
Glucose-Capillary: 97 mg/dL (ref 70–99)

## 2018-09-16 MED ORDER — INSULIN DETEMIR 100 UNIT/ML ~~LOC~~ SOLN: 15.0000 [IU] | Freq: Every day | SUBCUTANEOUS | 0 refills | Status: DC

## 2018-09-16 MED ORDER — INSULIN DETEMIR 100 UNIT/ML ~~LOC~~ SOLN: 15.0000 [IU] | Freq: Every day | SUBCUTANEOUS | 2 refills | Status: DC

## 2018-09-16 MED ORDER — ALBUTEROL SULFATE (2.5 MG/3ML) 0.083% IN NEBU: 2.5000 mg | INHALATION_SOLUTION | Freq: Four times a day (QID) | RESPIRATORY_TRACT | 0 refills | Status: DC

## 2018-09-16 NOTE — Consult Note (Signed)
Consultation Note Date: 09/16/2018   Patient Name: Joan Mosley  DOB: 1946/01/16  MRN: [id number]  Age / Sex: 73 y.o., female  PCP: Nche, Charlene Brooke, NP Referring Physician: Mariel Aloe, MD  Reason for Consultation: Establishing goals of care  HPI/Patient Profile: 73 y.o. female  with past medical history of ESRD on HD, emphysema on 6L O2 at home, end stage COPD, recent SDH admitted on 09/08/2018 with increasing SOB, weakness. Workup revealed multifactorial acute on chronic hypoxemic respiratory failure. Patient's plan for now is to return home with recommendations to keep oxygen >60%. Palliative medicine consulted for [place].    Clinical Assessment and Goals of Care: Met with patient and son [name] at the bedside. Then met separately with [name] in person, and [name] and Altamese Dilling (patient's children) by phone.   Initial conversation with patient regarding GOC were difficult due to patient with admitted confusion.   She answered most GOC questions with "I want to live". When questioned about her definition of living she stated- "being with my family". She stated she did not want to be on a ventilator.  When further more technical questions regarding code status and rehospitalization were discussed, it became clear that patient did not have capacity for medical decision making.   Upon further discussion with son [name] and other children patient has frequent confusion and memory issues. [name] states that patient will not remember our conversation.   This was explained to patient's children. In the absence of a designated HCPOA, the majority of reasonable available children become the decision makers.   We discussed her GOC- all elicited GOC to decrease her suffering. We discussed code status- all agreed to DNR/DNI due to the fact of her end stage multiorgan illnesses and the fact that CPR would not improve these issues,  nor would they meet patient's definition of living of "being with her family".   New MOST form was completed with following elections:   DNR,   Limited scope interventions- rehospitalization is acceptable, continue dialysis  No IV fluids, no feeding tube  Determine use or limitations of antibiotics when infection occurs  We discussed these choices may change in the future based on her trajectory. If they further discuss her status and decide to stop dialysis then Comfort Measures Only would be appropriate.   Family is agreeable to followup from Palliative at home- they believe that they were referred for this during her previous admission.   Primary Decision Maker NEXT OF KIN- majority of patient's reasonably available children   SUMMARY OF RECOMMENDATIONS -DNR- Gold form and MOST on chart -Referral for outpatient Palliative to see at home     Code Status/Advance Care Planning:  DNR  Prognosis:    < 6 months due to advanced endstage COPD, ongoing respiratory failure, high oxygen requirements, ESRD- on dialysis  Discharge Planning: Home with Palliative Services  Primary Diagnoses: Present on Admission:  Respiratory failure ([place])  ESRD (end stage renal disease) ([place])  SDH (subdural hematoma) ([place])   I have reviewed the medical  record, interviewed the patient and family, and examined the patient. The following aspects are pertinent.  Past Medical History:  Diagnosis Date   Arthritis    COPD (chronic obstructive pulmonary disease) ([place])    Diabetes mellitus without complication ([place])    Diabetic neuropathy ([place])    Oxygen dependent 03/30/2018   5L home O2   Renal disorder    ESRD   Thyroid disease    TIA (transient ischemic attack)    Social History   Socioeconomic History   Marital status: Widowed    Spouse name: Not on file   Number of children: 5   Years of education: Not on file   Highest education level: Not on file  Occupational History     Comment: disabled  Social Designer, fashion/clothing strain: Not on file   Food insecurity    Worry: Not on file    Inability: Not on file   Transportation needs    Medical: Not on file    Non-medical: Not on file  Tobacco Use   Smoking status: Former Smoker    Packs/day: 1.00    Years: 35.00    Pack years: 35.00    Quit date: 03/29/1981    Years since quitting: 37.4   Smokeless tobacco: Never Used  Substance and Sexual Activity   Alcohol use: Never    Frequency: Never   Drug use: Never   Sexual activity: Not Currently  Lifestyle   Physical activity    Days per week: Not on file    Minutes per session: Not on file   Stress: Not on file  Relationships   Social connections    Talks on phone: Not on file    Gets together: Not on file    Attends religious service: Not on file    Active member of club or organization: Not on file    Attends meetings of clubs or organizations: Not on file    Relationship status: Not on file  Other Topics Concern   Not on file  Social History Narrative   Not on file   Family History  Problem Relation Age of Onset   Heart disease Mother    Hypertension Mother    Heart disease Father    Hypertension Father    Scheduled Meds:  calcitRIOL  1.5 mcg Oral Q T,Th,Sa-HD   Chlorhexidine Gluconate Cloth  6 each Topical Q0600   cinacalcet  60 mg Oral Q T,Th,Sa-HD   darbepoetin (ARANESP) injection - DIALYSIS  60 mcg Intravenous Q Thu-HD   docusate sodium  200 mg Oral QHS   feeding supplement (PRO-STAT SUGAR FREE 64)  30 mL Oral BID   FLUoxetine  10 mg Oral Daily   insulin aspart  0-9 Units Subcutaneous TID WC   insulin aspart  3 Units Subcutaneous TID WC   insulin detemir  17 Units Subcutaneous Daily   ipratropium-albuterol  3 mL Nebulization TID   levothyroxine  112 mcg Oral QAC breakfast   mouth rinse  15 mL Mouth Rinse BID   midodrine  10 mg Oral 3 times per day on Tue Thu Sat   mometasone-formoterol  2 puff  Inhalation BID   montelukast  10 mg Oral QHS   rosuvastatin  40 mg Oral Daily   senna-docusate  1 tablet Oral QHS   sevelamer carbonate  2,400 mg Oral TID WC   sodium chloride flush  3 mL Intravenous Q12H   Continuous Infusions:  sodium chloride  sodium chloride     sodium chloride     PRN Meds:.sodium chloride, sodium chloride, sodium chloride, acetaminophen, albuterol, clonazePAM, guaiFENesin-dextromethorphan, heparin, lidocaine, lidocaine (PF), lidocaine-prilocaine, pentafluoroprop-tetrafluoroeth, sodium chloride flush Medications Prior to Admission:  Prior to Admission medications   Medication Sig Start Date End Date Taking? Authorizing Provider  acetaminophen (TYLENOL) 500 MG tablet Take 1,000 mg by mouth every 6 (six) hours as needed for mild pain.   Yes [provider]  albuterol (VENTOLIN HFA) 108 (90 Base) MCG/ACT inhaler Inhale 2 puffs into the lungs every 6 (six) hours as needed for wheezing or shortness of breath.   Yes [provider]  calcitRIOL (ROCALTROL) 0.25 MCG capsule Take 0.25 mcg by mouth daily.   Yes [provider]  clonazePAM (KLONOPIN) 1 MG tablet Take 1 tablet (1 mg total) by mouth 2 (two) times daily as needed for anxiety. Change in dose 08/08/18  Yes Nche, Charlene Brooke, NP  FLUoxetine (PROZAC) 10 MG tablet Take 1 tablet (10 mg total) by mouth daily. 08/08/18  Yes Nche, Charlene Brooke, NP  furosemide (LASIX) 80 MG tablet Take 80 mg by mouth See admin instructions. Sunday, Monday,Wed,Friday--in AM    Yes [provider]  insulin detemir (LEVEMIR) 100 UNIT/ML injection Inject 0.15 mLs (15 Units total) into the skin daily. 07/04/18  Yes Nche, Charlene Brooke, NP  insulin lispro (HUMALOG) 100 UNIT/ML injection Inject 1-5 Units into the skin 3 (three) times daily with meals as needed for high blood sugar. Sliding scale    Yes [provider]  ipratropium (ATROVENT) 0.02 % nebulizer solution Inhale 0.5 mg into the lungs 4  (four) times daily.    Yes [provider]  levothyroxine (SYNTHROID) 112 MCG tablet Take 1 tablet (112 mcg total) by mouth daily before breakfast. 1 AM Patient taking differently: Take 112 mcg by mouth daily before breakfast.  07/04/18  Yes Nche, Charlene Brooke, NP  lidocaine (LIDODERM) 5 % Place 1 patch onto the skin every 12 (twelve) hours as needed (back pain). Remove & Discard patch within 12 hours or as directed by MD    Yes [provider]  midodrine (PROAMATINE) 10 MG tablet Take 10 mg by mouth 3 (three) times daily. Taking on Dialysis days, Tues, Thur, Sat.   Yes [provider]  montelukast (SINGULAIR) 10 MG tablet Take 10 mg by mouth at bedtime. PM   Yes [provider]  nystatin (MYCOSTATIN/NYSTOP) powder Apply 1 g topically 2 (two) times daily as needed (skin rash).    Yes [provider]  nystatin cream (MYCOSTATIN) Apply 1 application topically 2 (two) times daily as needed for dry skin.    Yes [provider]  rosuvastatin (CRESTOR) 40 MG tablet Take 40 mg by mouth daily. AM   Yes [provider]  senna-docusate (SENNA PLUS) 8.6-50 MG tablet Take 1 tablet by mouth at bedtime. 07/04/18  Yes Nche, Charlene Brooke, NP  sevelamer (RENAGEL) 800 MG tablet Take 1,600 mg by mouth 3 (three) times daily with meals. 1600 mg --3 times daily--take additional 1-2 if eats snack   Yes [provider]   Allergies  Allergen Reactions   Avelox [Moxifloxacin Hcl In Nacl]     PT does not remember   Codeine Anaphylaxis    PT does not remember   Other     PT does not remember   Penicillins     Rash   Sulfa Antibiotics     PT does nolt remember   Shellfish  Allergy Hives, Itching and Rash   Review of Systems  Constitutional: Positive for activity change, appetite change and fatigue.  Respiratory: Positive for shortness of breath.   Psychiatric/Behavioral: Positive for confusion.    Physical Exam Vitals signs and nursing  note reviewed.  Constitutional:      Appearance: She is obese.  Cardiovascular:     Rate and Rhythm: Tachycardia present.  Pulmonary:     Comments: Increased effort at rest and with speaking Skin:    Coloration: Skin is pale.  Neurological:     Comments: Oriented, but unable to participate in higher level executive functions     Vital Signs: BP 126/67 (BP Location: Left Arm)    Pulse 79    Temp (!) 97.5 F (36.4 C) (Oral)    Resp 16    Ht _0  (1.549 m)    Wt 107 kg    SpO2 94%    BMI 44.57 kg/m  Pain Scale: 0-10 POSS *See Group Information*: S-Acceptable,Sleep, easy to arouse Pain Score: 8    SpO2: SpO2: 94 % O2 Device:SpO2: 94 % O2 Flow Rate: .O2 Flow Rate (L/min): 15 L/min  IO: Intake/output summary:   Intake/Output Summary (Last 24 hours) at 09/16/2018 1346 Last data filed at 09/15/2018 1823 Gross per 24 hour  Intake 240 ml  Output 2800 ml  Net -2560 ml    LBM: Last BM Date: ([id number]) Baseline Weight: Weight: (on ED stretcher) Most recent weight: Weight: 107 kg     Palliative Assessment/Data: PPS: 30%     Thank you for this consult. Palliative medicine will continue to follow and assist as needed.   Time In: 1230 Time Out: 1400 Time Total: 90 minutes Prolonged services billed: Yes Greater than 50%  of this time was spent counseling and coordinating care related to the above assessment and plan.  Signed by: Mariana Kaufman, AGNP-C Palliative Medicine    Please contact Palliative Medicine Team phone at [phone] for questions and concerns.  For individual provider: See Shea Evans

## 2018-09-16 NOTE — Progress Notes (Signed)
Subjective: seen in room, demanding to go home today  Objective Vital signs in last 24 hours: Vitals:   09/16/18 0117 09/16/18 0757 09/16/18 0856 09/16/18 0859  BP:  126/67    Pulse:      Resp:      Temp:  (!) 97.5 F (36.4 C)    TempSrc:  Oral    SpO2: 91%  97% 94%  Weight:      Height:       Weight change:   Physical Exam: General:Alert Obese , elderly female NAD In bed  Heart:RRR, no rub Lungs:Decr BS bases, Non labored breathing Abdomen:Obese, soft , NT, ND Extremities:trace bipedal edema, bilat ruddydiscolorationlower legs  Dialysis Access:Perm cathpatent on hd  [facility]  4h  400/1.5  105.5kg  2/2 bath  Hep none  TDC Calcitriol 1.5 mcg PO qHD Sensipar 60 mg PO qHD renvela 800mg  3 tabs AC Midodrine 10 mg PO TIW before HD and mid treatment for SBP <95   CXR 8/14 - bibasilar atx, CM  CT chest 8/16 - IMPRESSION: 1. No pulmonary embolism seen. Chronic pulmonary artery hypertension. 2. Small bibasilar consolidations, RIGHT greater than LEFT, likely chronic atelectasis. 3. Cardiomegaly. Diffuse coronary artery calcifications.  Problem/Plan: 1. Weakness, repeated falls: known SDH, debility. Is wheel chair bound  reportedly not ambulating /wasAgreeable to SNF then changed her mind and is going home today.   2. Hypoxia, baseline COPD: Chronic O2 requirement of 5L./CTchest  8/16 =no pulmonary edema. COPD sig contributor..   2. ESRD: TTS. HD per schedule. UF 2.5 L.tolerated last hd yest . No heparin with SDH.   3.HTN/volume: Midodrine pre HD.   4. Anemia: Hb 8.7, was to startESA 8/15on 60 mcg Aranesp ?? WasNot given?  will need at next hd today   5. Secondary hyperparathyroidism:q hdcalcitriol/ sensipar in HD, renvela as binder last phos 6.8   6. Nutrition: Albumin 3.1. prostat BID here.   7. Dispo - going home today.    Kelly Splinter  MD 09/16/2018, 12:19 PM    Labs: Basic Metabolic Panel: Recent Labs  Lab  09/13/18 0409 09/14/18 0558 09/15/18 0429  NA 130* 137 132*  K 5.6* 4.6 5.2*  CL 91* 97* 94*  CO2 22 26 23   GLUCOSE 250* 203* 194*  BUN 98* 45* 73*  CREATININE 8.28* 4.48* 6.34*  CALCIUM 8.4* 8.2* 7.9*   Liver Function Tests: No results for input(s): AST, ALT, ALKPHOS, BILITOT, PROT, ALBUMIN in the last 168 hours. No results for input(s): LIPASE, AMYLASE in the last 168 hours. No results for input(s): AMMONIA in the last 168 hours. CBC: Recent Labs  Lab 09/10/18 0525 09/10/18 0815 09/13/18 0744 09/15/18 1315  WBC 5.6 6.3 13.1* 16.0*  HGB 8.9* 8.5* 8.7* 8.5*  HCT 28.8* 27.9* 27.9* 27.9*  MCV 96.3 97.9 95.9 97.2  PLT 148* 169 171 198   Cardiac Enzymes: No results for input(s): CKTOTAL, CKMB, CKMBINDEX, TROPONINI in the last 168 hours. CBG: Recent Labs  Lab 09/15/18 0812 09/15/18 1208 09/15/18 1803 09/15/18 2140 09/16/18 0759  GLUCAP 165* 261* 103* 236* 105*    Studies/Results: Dg Chest Port 1 View  Result Date: 09/15/2018 CLINICAL DATA:  Hypoxemia. EXAM: PORTABLE CHEST 1 VIEW COMPARISON:  Radiograph September 09, 2018. FINDINGS: Stable cardiomegaly. Right internal jugular Port-A-Cath is unchanged in position. Atherosclerosis of thoracic aorta is noted. No pneumothorax is noted. Possible small right pleural effusion is noted. Stable minimal bibasilar subsegmental atelectasis is noted. Bony thorax is unremarkable. IMPRESSION: Stable minimal bibasilar subsegmental  atelectasis. Small right pleural effusion. Aortic Atherosclerosis (ICD10-I70.0). Electronically Signed   By: Marijo Conception M.D.   On: 09/15/2018 13:35   Medications: . sodium chloride    . sodium chloride    . sodium chloride     . calcitRIOL  1.5 mcg Oral Q T,Th,Sa-HD  . Chlorhexidine Gluconate Cloth  6 each Topical Q0600  . cinacalcet  60 mg Oral Q T,Th,Sa-HD  . darbepoetin (ARANESP) injection - DIALYSIS  60 mcg Intravenous Q Thu-HD  . docusate sodium  200 mg Oral QHS  . feeding supplement (PRO-STAT  SUGAR FREE 64)  30 mL Oral BID  . FLUoxetine  10 mg Oral Daily  . insulin aspart  0-9 Units Subcutaneous TID WC  . insulin aspart  3 Units Subcutaneous TID WC  . insulin detemir  17 Units Subcutaneous Daily  . ipratropium-albuterol  3 mL Nebulization TID  . levothyroxine  112 mcg Oral QAC breakfast  . mouth rinse  15 mL Mouth Rinse BID  . midodrine  10 mg Oral 3 times per day on Tue Thu Sat  . mometasone-formoterol  2 puff Inhalation BID  . montelukast  10 mg Oral QHS  . rosuvastatin  40 mg Oral Daily  . senna-docusate  1 tablet Oral QHS  . sevelamer carbonate  2,400 mg Oral TID WC  . sodium chloride flush  3 mL Intravenous Q12H

## 2018-09-16 NOTE — Discharge Instructions (Signed)
Joan Mosley,  You were in the hospital with trouble breathing. You were treated for a COPD exacerbation and are at baseline. Please follow-up with palliative care. Please adjust your oxygen for symptoms rather than a specific number. If you have worsening trouble breathing, shortness of breath, etc, please adjust your oxygen and/or call your primary doctor.

## 2018-09-16 NOTE — Progress Notes (Signed)
Notified [facility] of need for ambulance transport home at discharge. Patient in queue.

## 2018-09-16 NOTE — Progress Notes (Addendum)
NAME:  Joan Mosley, MRN:  [id number], DOB:  1945/08/09, LOS: 8 ADMISSION DATE:  09/08/2018, CONSULTATION DATE:  09/15/18 REFERRING MD:  Dr. Lonny Prude, CHIEF COMPLAINT:  Weakness.     Brief History   73 y/o F admitted 8/13 with of weakness.  Found to have multi-factorial acute on chronic hypoxemic respiratory failure.   Past Medical History  Morbid Obesity - BMI 45  ESRD - HD T,Th,S DM II  SDH - discharged 09/07/18 Chronic Hypotension - on midodrine  COPD  Former Smoker - quit 1983, > 1ppd for 22 years. Husband was a smoker and currently lives with a smoker.   Significant Hospital Events   8/13 Admit  8/20 PCCM consulted   Consults:  PCCM 8/20   Procedures:  Limited ECHO Bubble Study 8/21 >>   Significant Diagnostic Tests:  CT Head w/o 8/13 >> no changes, mixed density bilateral convexity subdural hematomas CTA Chest 8/16 >> negative for PE, chronic pulmonary hypertension, small bibasilar consolidations R>L, likely chronic atelectasis, cardiomegaly ECHO Bubble Study 8/21 >>   Micro Data:  COVID 8/13 >> negative  MRSA PCR 8/14 >> positive  BCx2 8/13 >> negative   Antimicrobials:    Interim history/subjective:  Afebrile.  I/O - 2.8L removed with HD on 8/20.  Pt states "I want to go home".   Objective   Blood pressure 126/67, pulse 79, temperature (!) 97.5 F (36.4 C), temperature source Oral, resp. rate 16, height 5\' 1"  (1.549 m), weight 107 kg, SpO2 91 %.        Intake/Output Summary (Last 24 hours) at 09/16/2018 0814 Last data filed at 09/15/2018 1823 Gross per 24 hour  Intake 240 ml  Output 2800 ml  Net -2560 ml   Filed Weights   09/13/18 [id number] 09/15/18 1305 09/15/18 1710  Weight: 108.6 kg 111 kg 107 kg    Examination: General: elderly female lying in bed in NAD HEENT: MM pink/moist, one tooth on bottom, missing teeth  Neuro: Awake, alert, oriented to self, place.  Situational forgetfulness, long term memory difficulties. MAE.  Appropriate in the moment.   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 It is difficult to assess her baseline COPD burden due to her limited physical activity.  Note she does have coronary artery calcifications on CTA and mild LVH, diastolic dysfunction on ECHO.   P: Reviewed saturation goal > accept saturations greater than 60%.  Patient likely has been chronically low.  As long as she is not in distress or dyspneic, would not up titrate O2 Will arrange for home oxymizer  Pulmonary hygiene - IS, mobilize as able Await bubble study results   COPD  P: Dulera, Duoneb TID  Continue Singulair   Pulmonary Hypertension  -likely combination of Group 2, 3 P: No role for pulmonary vasodilators  Focus on optimization of underlying disease process   Suspected OSA  -no evidence to suggest OHS, no hypercarbia on prior ABG's  P: BiPAP QHS & PRN daytime sleep  Will likely need outpatient sleep study  Morbid Obesity with Chronic Atelectasis  P: Mobilize  PT efforts   Multiple Falls  -difficulty getting in / out of car for HD, recent fall with bilateral SDH Adult Failure to Thrive  P: Agree with Palliative Care for discussion with family regarding goals of care Son asking for SW consult to assist with goals of care / living will paperwork  Consider MOST form.  Patient again states she would want "to try anything and if it works it works, if it doesn't fine" in regards to goals of care.   Best practice:  Diet: Carb Modified   Pain/Anxiety/Delirium protocol (if indicated): n/a VAP protocol (if indicated): n/a  DVT prophylaxis: on hold due to recent SDH  GI prophylaxis: n/a  Glucose control: n/a  Mobility: As tolerated  Code Status: Full Code.  However, patient did indicate in discussion she would not want to be intubated.  When questioned further, she did state she "wanted to live" in response to CPR questioning.  She did not understand that the two would come together in the event of CPR.  Family Communication: Per Primary  Disposition: Per Primary   Labs   CBC: Recent Labs  Lab 09/10/18 0525 09/10/18 0815 09/13/18 0744 09/15/18 1315  WBC 5.6 6.3 13.1* 16.0*  HGB 8.9* 8.5* 8.7* 8.5*  HCT 28.8* 27.9* 27.9* 27.9*  MCV 96.3 97.9 95.9 97.2  PLT 148* 169 171 [id number]    Basic Metabolic Panel: Recent Labs  Lab 09/11/18 0617 09/12/18 0714 09/13/18 0409 09/14/18 0558 09/15/18 0429  NA 135 131* 130* 137 132*  K 4.6 5.1 5.6* 4.6 5.2*  CL 96* 93* 91* 97* 94*  CO2 24 22 22 26 23   GLUCOSE 204* 200* 250* 203* 194*  BUN 40* 70* 98* 45* 73*  CREATININE 4.57* 6.63* 8.28* 4.48* 6.34*  CALCIUM 9.1 8.6* 8.4* 8.2* 7.9*   GFR: Estimated Creatinine Clearance: 8.7 mL/min (A) (by C-G formula based on SCr of 6.34 mg/dL (H)). Recent Labs  Lab 09/10/18 0525 09/10/18 0815 09/13/18 0744 09/15/18 1315  WBC 5.6 6.3 13.1* 16.0*    Liver Function Tests: No results for input(s): AST, ALT, ALKPHOS, BILITOT, PROT, ALBUMIN in the last 168 hours. No results for input(s): LIPASE, AMYLASE in the last 168 hours. No results for input(s): AMMONIA in the last 168 hours.  ABG    Component Value Date/Time   PHART 7.439 08/22/2018 2015   PCO2ART 41.5 08/22/2018 2015   PO2ART 122.0 (H) 08/22/2018 2015   HCO3 30.4 (H) 09/08/2018 1541   TCO2 32 09/08/2018 1541   ACIDBASEDEF 2.0 08/01/2018 0521   O2SAT 98.0 09/08/2018 1541     Coagulation Profile: No results for input(s): INR, PROTIME in the last 168 hours.  Cardiac  Enzymes: No results for input(s): CKTOTAL, CKMB, CKMBINDEX, TROPONINI in the last 168 hours.  HbA1C: Hgb A1c MFr Bld  Date/Time Value Ref Range Status  07/01/2018 11:49 AM 6.1 (H) <5.7 % of total Hgb Final    Comment:    For someone without known diabetes, a hemoglobin  A1c value between 5.7% and 6.4% is consistent with prediabetes and should be confirmed with a  follow-up test. . For someone with known diabetes, a value <7% indicates that their diabetes is well controlled. A1c targets should be individualized based on duration of diabetes, age, comorbid conditions, and other considerations. . This assay result is consistent with an increased risk of diabetes. . Currently, no consensus exists regarding use of hemoglobin A1c for diagnosis of diabetes for  children. .     CBG: Recent Labs  Lab 09/15/18 0812 09/15/18 1208 09/15/18 1803 09/15/18 2140 09/16/18 0759  GLUCAP 165* 261* 103* 236* 105*    Critical care time: n/a     Noe Gens, NP-C [facility] Pgr: [phone] or if no answer [phone] 09/16/2018, 8:14 AM

## 2018-09-16 NOTE — Progress Notes (Signed)
Physical Therapy Treatment Patient Details Name: Joan Mosley MRN: [id number] DOB: 04-20-1945 Today's Date: 09/16/2018    History of Present Illness Patient is a 73 y/o female presenting with weakness. past medical history significant for ESRD on dialysis Tuesday, Thursday, Saturday, morbid obesity, type 2 diabetes with recent admission for subdural hematoma. Admitted for Acute on chronic hypoxemic respiratory failure.     PT Comments    Pt with improving mobility. Able to perform stand pivot transfer back to bed with +2 min assist. Pt eager to return home.    Follow Up Recommendations  Home health PT;Supervision/Assistance - 24 hour(Pt/family refusing SNF)     Equipment Recommendations  None recommended by PT    Recommendations for Other Services       Precautions / Restrictions Precautions Precautions: Fall Restrictions Weight Bearing Restrictions: No    Mobility  Bed Mobility Overal bed mobility: Needs Assistance Bed Mobility: Sit to Supine      Sit to supine: +2 for physical assistance;Mod assist   General bed mobility comments: Assist to lower trunk and bring legs back up into the bed  Transfers Overall transfer level: Needs assistance Equipment used: 2 person hand held assist Transfers: Sit to/from Stand;Stand Pivot Transfers Sit to Stand: +2 physical assistance;Min assist Stand pivot transfers: +2 physical assistance;Min assist       General transfer comment: Assist to bring hips up and for balance. Assist for balance and support to take pivotal steps from chair to bed  Ambulation/Gait                 Stairs             Wheelchair Mobility    Modified Rankin (Stroke Patients Only)       Balance Overall balance assessment: Needs assistance Sitting-balance support: Feet supported;No upper extremity supported Sitting balance-Leahy Scale: Fair     Standing balance support: Bilateral upper extremity supported Standing balance-Leahy  Scale: Poor Standing balance comment: +2 min assist for static standing                            Cognition Arousal/Alertness: Awake/alert Behavior During Therapy: WFL for tasks assessed/performed Overall Cognitive Status: History of cognitive impairments - at baseline Area of Impairment: Memory                     Memory: Decreased short-term memory                Exercises      General Comments General comments (skin integrity, edema, etc.): Pt on 6L of O2      Pertinent Vitals/Pain Pain Assessment: Faces Faces Pain Scale: No hurt Pain Location: generalized Pain Descriptors / Indicators: Sore;Discomfort Pain Intervention(s): Limited activity within patient's tolerance;Monitored during session;Repositioned    Home Living                      Prior Function            PT Goals (current goals can now be found in the care plan section) Progress towards PT goals: Progressing toward goals    Frequency    Min 3X/week      PT Plan Current plan remains appropriate;Frequency needs to be updated    Co-evaluation PT/OT/SLP Co-Evaluation/Treatment: Yes Reason for Co-Treatment: Complexity of the patient's impairments (multi-system involvement);For patient/therapist safety PT goals addressed during session: Mobility/safety with mobility OT goals addressed during session: ADL's  and self-care;Proper use of Adaptive equipment and DME      AM-PAC PT "6 Clicks" Mobility   Outcome Measure  Help needed turning from your back to your side while in a flat bed without using bedrails?: A Lot Help needed moving from lying on your back to sitting on the side of a flat bed without using bedrails?: A Lot Help needed moving to and from a bed to a chair (including a wheelchair)?: A Little Help needed standing up from a chair using your arms (e.g., wheelchair or bedside chair)?: A Little Help needed to walk in hospital room?: Total Help needed  climbing 3-5 steps with a railing? : Total 6 Click Score: 12    End of Session Equipment Utilized During Treatment: Oxygen Activity Tolerance: Patient tolerated treatment well Patient left: with call bell/phone within reach;in bed Nurse Communication: Mobility status(nurse assisted) PT Visit Diagnosis: Unsteadiness on feet (R26.81);Repeated falls (R29.6);Muscle weakness (generalized) (M62.81)     Time: [id number] PT Time Calculation (min) (ACUTE ONLY): 9 min  Charges:  $Therapeutic Activity: 8-22 mins                     Libby Pager [phone] Office [place] 09/16/2018, 4:34 PM

## 2018-09-16 NOTE — Discharge Summary (Signed)
Physician Discharge Summary  Vaibhavi Czaplewski [id number] DOB: 30-May-1945 DOA: 09/08/2018  PCP: Flossie Buffy, NP  Admit date: 09/08/2018 Discharge date: 09/16/2018  Admitted From: Home Disposition: Home  Recommendations for Outpatient Follow-up:  1. Follow up with PCP in 1 week 2. Please obtain BMP/CBC in one week 3. Outpatient palliative care 4. Oxygen supplementation based on symptoms 5. Please follow up on the following pending results: None  Home Health: PT, OT, RN, aide, social work Equipment/Devices: Oxygen  Discharge Condition: Guarded CODE STATUS: DNR Diet recommendation: Renal diet   Brief/Interim Summary:  Admission HPI written by Merton Border, MD   HPI  Joan Mosley  is a 73 y.o. female, with past medical history significant for end-stage renal disease, on hemodialysis, morbid obesity, diabetes mellitus type 2 with a recent admission for subdural hematomas discharged home yesterday under the care of her children.  When I saw the patient earlier she was confused and could not give any significant history but she started improving over time.  Most of the history was taken from the triage nurse who reported that she has been feeling weak and lethargic for the last few days.  She missed her dialysis on Tuesday.  When EMS saw the patient she was hypoxemic at 89% and she had to be placed on 10 L high flow nasal cannula.    Hospital course:  COPD exacerbation COVID-19 negative. Chest x-ray significant for peribronchial airspace consolidation. Initially on Bipap now on HFNC. No PE on CTA. Patient followed with pulmonologist in [place] prior to move to [place]. Weaned steroids off as patient was without wheezing near discharge. Continue albuterol and ipratropium QID on discharge.  Acute on chronic respiratory failure with hypoxia Baseline oxygen use of 5L via nasal canula. Patient needed as much as 12 L via HFNC at rest. Pulmonology consulted and  recommended lower O2 goal of 60, but more importantly, to titrate based on patient's symptoms. Palliative care consulted as well and patient recommended for outpatient palliative care.    ESRD on HD Nephrology management  Hyperkalemia Management with HD  Hypotension Continue midodrine  Diabetes mellitus, type 2 Continue Levemir and SSI  Hypothyroidism Continue Synthroid  Hyperlipidemia Continue Crestor  SDH Stable.  Demand ischemia No chest pain.  Depression/anxiety Continue Prozac and Klonopin   Discharge Diagnoses:  Principal Problem:   Acute on chronic respiratory failure with hypoxia (HCC) Active Problems:   Type 2 diabetes mellitus with diabetic polyneuropathy, with long-term current use of insulin (HCC)   ESRD (end stage renal disease) (HCC)   SDH (subdural hematoma) (HCC)   Respiratory failure (HCC)   Advanced care planning/counseling discussion   Goals of care, counseling/discussion   Palliative care by specialist    Discharge Instructions   Allergies as of 09/16/2018      Reactions   Avelox [moxifloxacin Hcl In Nacl]    PT does not remember   Codeine Anaphylaxis   PT does not remember   Other    PT does not remember   Penicillins    Rash   Sulfa Antibiotics    PT does nolt remember   Shellfish Allergy Hives, Itching, Rash      Medication List    TAKE these medications   acetaminophen 500 MG tablet Commonly known as: TYLENOL Take 1,000 mg by mouth every 6 (six) hours as needed for mild pain.   albuterol 108 (90 Base) MCG/ACT inhaler Commonly known as: VENTOLIN HFA Inhale 2 puffs into the lungs every  6 (six) hours as needed for wheezing or shortness of breath. What changed: Another medication with the same name was added. Make sure you understand how and when to take each.   albuterol (2.5 MG/3ML) 0.083% nebulizer solution Commonly known as: PROVENTIL Take 3 mLs (2.5 mg total) by nebulization 4 (four) times daily. What  changed: You were already taking a medication with the same name, and this prescription was added. Make sure you understand how and when to take each.   calcitRIOL 0.25 MCG capsule Commonly known as: ROCALTROL Take 0.25 mcg by mouth daily.   clonazePAM 1 MG tablet Commonly known as: KLONOPIN Take 1 tablet (1 mg total) by mouth 2 (two) times daily as needed for anxiety. Change in dose   FLUoxetine 10 MG tablet Commonly known as: PROZAC Take 1 tablet (10 mg total) by mouth daily.   furosemide 80 MG tablet Commonly known as: LASIX Take 80 mg by mouth See admin instructions. Sunday, Monday,Wed,Friday--in AM   insulin detemir 100 UNIT/ML injection Commonly known as: LEVEMIR Inject 0.15 mLs (15 Units total) into the skin daily.   insulin lispro 100 UNIT/ML injection Commonly known as: HUMALOG Inject 1-5 Units into the skin 3 (three) times daily with meals as needed for high blood sugar. Sliding scale   ipratropium 0.02 % nebulizer solution Commonly known as: ATROVENT Inhale 0.5 mg into the lungs 4 (four) times daily.   levothyroxine 112 MCG tablet Commonly known as: SYNTHROID Take 1 tablet (112 mcg total) by mouth daily before breakfast. 1 AM What changed: additional instructions   lidocaine 5 % Commonly known as: LIDODERM Place 1 patch onto the skin every 12 (twelve) hours as needed (back pain). Remove & Discard patch within 12 hours or as directed by MD   midodrine 10 MG tablet Commonly known as: PROAMATINE Take 10 mg by mouth 3 (three) times daily. Taking on Dialysis days, Tues, Thur, Sat.   montelukast 10 MG tablet Commonly known as: SINGULAIR Take 10 mg by mouth at bedtime. PM   nystatin powder Commonly known as: MYCOSTATIN/NYSTOP Apply 1 g topically 2 (two) times daily as needed (skin rash).   nystatin cream Commonly known as: MYCOSTATIN Apply 1 application topically 2 (two) times daily as needed for dry skin.   rosuvastatin 40 MG tablet Commonly known as:  CRESTOR Take 40 mg by mouth daily. AM   senna-docusate 8.6-50 MG tablet Commonly known as: Senna Plus Take 1 tablet by mouth at bedtime.   sevelamer 800 MG tablet Commonly known as: RENAGEL Take 1,600 mg by mouth 3 (three) times daily with meals. 1600 mg --3 times daily--take additional 1-2 if eats snack            Durable Medical Equipment  (From admission, onward)         Start     Ordered   09/16/18 1121  For home use only DME oxygen  Once    Comments: Please arrange for patient to have home oxymizer > she is on 6L at baseline.  Her home O2 goes up to 10L.  Question Answer Comment  Length of Need Lifetime   Mode or (Route) Nasal cannula   Liters per Minute 6   Frequency Continuous (stationary and portable oxygen unit needed)   Oxygen delivery system Gas      08 /21/20 1121         Follow-up Information    Nche, Charlene Brooke, NP. Schedule an appointment as soon as possible for a visit in  1 week(s).   Specialty: Internal Medicine Contact information: [place] [address] [phone]          Allergies  Allergen Reactions   Avelox [Moxifloxacin Hcl In Nacl]     PT does not remember   Codeine Anaphylaxis    PT does not remember   Other     PT does not remember   Penicillins     Rash   Sulfa Antibiotics     PT does nolt remember   Shellfish Allergy Hives, Itching and Rash    Consultations:  Nephrology  Pulmonology  Palliative care   Procedures/Studies: Dg Chest 2 View  Result Date: 08/22/2018 CLINICAL DATA:  Weakness, altered level of consciousness EXAM: CHEST - 2 VIEW COMPARISON:  08/02/2018 FINDINGS: Lungs are clear.  No pleural effusion or pneumothorax. Cardiomegaly. Right IJ dual lumen dialysis catheter terminates in the upper right atrium. Mild degenerative changes of the visualized thoracolumbar spine. IMPRESSION: Normal chest radiographs. Electronically Signed   By: Julian Hy M.D.   On: 08/22/2018  19:52   Ct Head Wo Contrast  Result Date: 09/08/2018 CLINICAL DATA:  Unexplained altered level of consciousness. Week. Lethargic. Dialysis patient. EXAM: CT HEAD WITHOUT CONTRAST TECHNIQUE: Contiguous axial images were obtained from the base of the skull through the vertex without intravenous contrast. COMPARISON:  09/06/2018 FINDINGS: Brain: No change over the last 2 days. Bilateral mixed density subdural hematomas, approximately 1 cm thickness on each side. No evidence of enlargement or additional hyperdense bleeding. Because there is brain atrophy, there is only minimal mass effect. Left-to-right midline shift of 2 mm. No intraparenchymal hemorrhage. No acute parenchymal infarction. Chronic small-vessel ischemic changes affecting the pons, thalami and hemispheric white matter. Vascular: There is atherosclerotic calcification of the major vessels at the base of the brain. Skull: Negative Sinuses/Orbits: Chronic right maxillary sinusitis. Mucosal inflammation of the frontal and ethmoid regions on the right as well. Other: None IMPRESSION: No significant change over the last 2 days. Mixed density bilateral convexity subdural hematomas, proximal thickness 1 cm on each side. No additional hyperdense bleeding. Minimal mass effect with left-to-right shift of 2 mm. Electronically Signed   By: Nelson Chimes M.D.   On: 09/08/2018 17:24   Ct Head Wo Contrast  Result Date: 09/06/2018 CLINICAL DATA:  Altered mental status and fatigue. Recent subdural hematomas EXAM: CT HEAD WITHOUT CONTRAST TECHNIQUE: Contiguous axial images were obtained from the base of the skull through the vertex without intravenous contrast. COMPARISON:  August 25, 2018 FINDINGS: Brain: There is age related volume loss. There remains a subdural hematoma in the right frontal region with both acute and more chronic appearing fluid. There remains a degree of mass effect on the right frontal lobe, slightly less than on the previous study. The current  maximum thickness of the subdural hematoma on the right measures 1.4 cm compared to a maximum thickness of 1.6 cm. There is a subdural hematoma on the left in the frontal and parietal regions which also has mixed attenuation consistent with both relatively acute and more subacute to chronic hemorrhage. The maximum thickness of this subdural hematoma measures 1.3 cm compared to 1.6 cm on most recent study. No new hemorrhage or new extra-axial fluid collections are evident. There is no intra-axial mass or hemorrhage. Currently there is 2 mm of midline shift to the right compared to 5 mm of midline shift to the right on most recent study. There is small vessel disease in the centra semiovale bilaterally. No  acute infarct is appreciable. Vascular: No hyperdense vessels are evident. There is calcification in each carotid siphon region and distal left vertebral artery. Skull: The bony calvarium appears intact. Sinuses/Orbits: There is opacification throughout the right maxillary antrum. There are more scattered areas of opacification in the left maxillary antrum with probable polyp superiorly. There is mucosal thickening in several ethmoid air cells as well as in the right frontal sinus region. Orbits appear symmetric bilaterally. Other: There is opacification of several inferior mastoid air cells bilaterally, stable. IMPRESSION: 1. There remain bilateral subdural hematomas with both more acute and more chronic appearing fluid on each side. The right subdural hematoma is seen in the right frontal region with mild mass effect on the right frontal lobe. This subdural hematoma at its maximum measures 1.4 cm compared to a thickness of 1.6 cm on the previous study. On the left, the subdural hematoma is slightly larger and involves frontal and parietal regions. The mixed attenuation in the fluid remains stable. The maximum thickness on the left currently measures 1.3 cm compared to 1.6 cm on the previous study. There appears to  be slightly less mass effect from the subdural hematomas compared to recent prior study. There is 2 mm of midline shift to the right compared to 5 mm of midline shift to the right on the previous study. 2. No intra-axial hemorrhage. There is patchy periventricular small vessel disease. No acute infarct evident. 3.  Foci of arterial vascular calcification noted. 4. Multifocal paranasal sinus disease again noted. Mild inferior mastoid disease bilaterally noted. Electronically Signed   By: Lowella Grip III M.D.   On: 09/06/2018 08:43   Ct Head Wo Contrast  Result Date: 08/25/2018 CLINICAL DATA:  Follow-up examination for subdural hemorrhage. EXAM: CT HEAD WITHOUT CONTRAST TECHNIQUE: Contiguous axial images were obtained from the base of the skull through the vertex without intravenous contrast. COMPARISON:  Prior CT from 08/22/2018 FINDINGS: Brain: Bilateral extra-axial mixed density subdural hematomas again seen, acute-subacute on chronic in appearance. Left-sided collection measures up to 16 mm in maximal thickness at the left frontal convexity. Right collection measures up to 16 mm as well. Overall, appearance is not significantly changed from previous. Secondary mild mass effect on the subjacent cerebral hemispheres with persistent 5 mm left-to-right shift, little interval changed. No hydrocephalus or ventricular trapping. Basilar cisterns remain patent. No new intracranial hemorrhage. No acute large vessel territory infarct. Atrophy with chronic microvascular ischemic disease noted. Vascular: Calcified atherosclerosis noted at the skull base. No hyperdense vessel. Skull: Scalp soft tissues and calvarium within normal limits. Sinuses/Orbits: Globes and orbital soft tissues are normal. Chronic right-sided paranasal sinusitis noted. Trace bilateral mastoid effusions noted. Other: None. IMPRESSION: 1. No significant interval change in size and appearance of bilateral mixed density subdural hematomas,  measuring up to 16 mm in maximal diameter on current exam. Associated 5 mm left-to-right midline shift also not significantly changed. 2. No other new acute intracranial abnormality. 3. Atrophy with chronic microvascular ischemic disease. 4. Chronic right-sided paranasal sinusitis. Electronically Signed   By: Jeannine Boga M.D.   On: 08/25/2018 04:06   Ct Head Wo Contrast  Result Date: 08/22/2018 CLINICAL DATA:  Altered level of consciousness. EXAM: CT HEAD WITHOUT CONTRAST TECHNIQUE: Contiguous axial images were obtained from the base of the skull through the vertex without intravenous contrast. COMPARISON:  None. FINDINGS: Brain: Bilateral extra-axial fluid collections compatible with subdural hematomas that appears subacute to chronic. Majority of the fluid is low-density. There are small areas of higher  density. Multiple septations are seen within the fluid. Right-sided collection measures approximately 14 mm and left sided collection measures 15 mm. Minimal midline shift to the right. Negative for hydrocephalus. No acute infarct. Chronic ischemic change in the white matter. Vascular: Negative for hyperdense vessel Skull: Negative Sinuses/Orbits: Mucosal edema paranasal sinuses most notably right frontal ethmoid and maxillary sinuses. Bony thickening of the maxillary sinus bilaterally Other: None IMPRESSION: 15 mm bilateral subdural hematomas which appears subacute to chronic. Majority of the fluid is low-density without evidence of acute bleeding. Minimal midline shift to the right. These results were called by telephone at the time of interpretation on 08/22/2018 at 9:18 pm to Dr. Isla Pence , who verbally acknowledged these results. Electronically Signed   By: Franchot Gallo M.D.   On: 08/22/2018 21:19   Ct Angio Chest Pe W Or Wo Contrast  Result Date: 09/11/2018 CLINICAL DATA:  PE suspected, high pretest probability. EXAM: CT ANGIOGRAPHY CHEST WITH CONTRAST TECHNIQUE: Multidetector CT  imaging of the chest was performed using the standard protocol during bolus administration of intravenous contrast. Multiplanar CT image reconstructions and MIPs were obtained to evaluate the vascular anatomy. CONTRAST:  64mL OMNIPAQUE IOHEXOL 350 MG/ML SOLN COMPARISON:  None. FINDINGS: Cardiovascular: There is no pulmonary embolism identified within the main, lobar or segmental pulmonary arteries bilaterally. Main pulmonary arteries are prominent indicating chronic pulmonary artery hypertension. No thoracic aortic aneurysm. Aortic atherosclerosis. No pericardial effusion. Cardiomegaly. Diffuse coronary artery calcifications. Mediastinum/Nodes: No mass or enlarged lymph nodes seen within the mediastinum or perihilar regions. Esophagus is unremarkable. Trachea and central bronchi are unremarkable. Lungs/Pleura: Small bibasilar consolidations, RIGHT greater than LEFT, likely chronic atelectasis. No pleural effusion or pneumothorax seen. Mild emphysematous changes at the lung apices. Associated scarring/fibrosis within the upper lungs. Upper Abdomen: Limited images of the upper abdomen are unremarkable. Musculoskeletal: Degenerative spondylosis throughout the slightly scoliotic thoracolumbar spine, mild to moderate in degree. No acute appearing osseous abnormality. Review of the MIP images confirms the above findings. IMPRESSION: 1. No pulmonary embolism seen. Chronic pulmonary artery hypertension. 2. Small bibasilar consolidations, RIGHT greater than LEFT, likely chronic atelectasis. 3. Cardiomegaly. Diffuse coronary artery calcifications. Aortic Atherosclerosis (ICD10-I70.0) and Emphysema (ICD10-J43.9). Electronically Signed   By: Franki Cabot M.D.   On: 09/11/2018 13:14   Dg Chest Port 1 View  Result Date: 09/15/2018 CLINICAL DATA:  Hypoxemia. EXAM: PORTABLE CHEST 1 VIEW COMPARISON:  Radiograph September 09, 2018. FINDINGS: Stable cardiomegaly. Right internal jugular Port-A-Cath is unchanged in position.  Atherosclerosis of thoracic aorta is noted. No pneumothorax is noted. Possible small right pleural effusion is noted. Stable minimal bibasilar subsegmental atelectasis is noted. Bony thorax is unremarkable. IMPRESSION: Stable minimal bibasilar subsegmental atelectasis. Small right pleural effusion. Aortic Atherosclerosis (ICD10-I70.0). Electronically Signed   By: Marijo Conception M.D.   On: 09/15/2018 13:35   Dg Chest Port 1 View  Result Date: 09/09/2018 CLINICAL DATA:  Shortness of breath, hypoxia EXAM: PORTABLE CHEST 1 VIEW COMPARISON:  09/08/2018 FINDINGS: Right IJ dialysis catheter tips SVC RA junction. Stable cardiomegaly and bibasilar opacities, favored to represent atelectasis over pneumonia. Small effusions suspected. Overall stable aeration. No pneumothorax. Trachea midline. Aorta atherosclerotic. IMPRESSION: Stable cardiomegaly and bibasilar atelectasis pattern. Atherosclerosis Electronically Signed   By: Jerilynn Mages.  Shick M.D.   On: 09/09/2018 11:45   Dg Chest Port 1 View  Result Date: 09/08/2018 CLINICAL DATA:  Weakness for 3 days. EXAM: PORTABLE CHEST 1 VIEW COMPARISON:  August 22, 2018 FINDINGS: Dual lumen injectable port in stable position. Mildly enlarged  cardiac silhouette. Bibasilar atelectasis versus peribronchial airspace consolidation. Osseous structures are without acute abnormality. Soft tissues are grossly normal. IMPRESSION: Bibasilar atelectasis versus peribronchial airspace consolidation. Electronically Signed   By: Fidela Salisbury M.D.   On: 09/08/2018 15:15      Subjective: No dyspnea.  Discharge Exam: Vitals:   09/16/18 0859 09/16/18 1534  BP:    Pulse:  74  Resp:  18  Temp:    SpO2: 94% 95%   Vitals:   09/16/18 0757 09/16/18 0856 09/16/18 0859 09/16/18 1534  BP: 126/67     Pulse:    74  Resp:    18  Temp: (!) 97.5 F (36.4 C)     TempSrc: Oral     SpO2:  97% 94% 95%  Weight:      Height:        General: Pt is alert, awake, not in acute  distress Cardiovascular: RRR, S1/S2 +, no rubs, no gallops Respiratory: CTA bilaterally, no wheezing, no rhonchi Abdominal: Soft, NT, ND, bowel sounds + Extremities: no cyanosis    The results of significant diagnostics from this hospitalization (including imaging, microbiology, ancillary and laboratory) are listed below for reference.     Microbiology: Recent Results (from the past 240 hour(s))  Culture, blood (Routine X 2) w Reflex to ID Panel     Status: None   Collection Time: 09/08/18  2:40 PM   Specimen: BLOOD RIGHT FOREARM  Result Value Ref Range Status   Specimen Description BLOOD RIGHT FOREARM  Final   Special Requests   Final    BOTTLES DRAWN AEROBIC ONLY Blood Culture results may not be optimal due to an inadequate volume of blood received in culture bottles   Culture   Final    NO GROWTH 5 DAYS Performed at [facility], [place] [address], [place], [place] [address]    Report Status 09/13/2018 FINAL  Final  Culture, blood (Routine X 2) w Reflex to ID Panel     Status: None   Collection Time: 09/08/18  3:14 PM   Specimen: BLOOD  Result Value Ref Range Status   Specimen Description BLOOD SITE NOT SPECIFIED  Final   Special Requests   Final    BOTTLES DRAWN AEROBIC AND ANAEROBIC Blood Culture results may not be optimal due to an inadequate volume of blood received in culture bottles   Culture   Final    NO GROWTH 5 DAYS Performed at [facility], [place] [address] [place], [place] [address]    Report Status 09/13/2018 FINAL  Final  SARS Coronavirus 2 [facility] order, Performed in [facility] hospital lab) Nasopharyngeal Nasopharyngeal Swab     Status: None   Collection Time: 09/08/18  4:10 PM   Specimen: Nasopharyngeal Swab  Result Value Ref Range Status   SARS Coronavirus 2 NEGATIVE NEGATIVE Final    Comment: (NOTE) If result is NEGATIVE SARS-CoV-2 target nucleic acids are NOT DETECTED. The SARS-CoV-2 RNA is generally detectable in upper and lower   respiratory specimens during the acute phase of infection. The lowest  concentration of SARS-CoV-2 viral copies this assay can detect is 250  copies / mL. A negative result does not preclude SARS-CoV-2 infection  and should not be used as the sole basis for treatment or other  patient management decisions.  A negative result may occur with  improper specimen collection / handling, submission of specimen other  than nasopharyngeal swab, presence of viral mutation(s) within the  areas targeted by  this assay, and inadequate number of viral copies  (<250 copies / mL). A negative result must be combined with clinical  observations, patient history, and epidemiological information. If result is POSITIVE SARS-CoV-2 target nucleic acids are DETECTED. The SARS-CoV-2 RNA is generally detectable in upper and lower  respiratory specimens dur ing the acute phase of infection.  Positive  results are indicative of active infection with SARS-CoV-2.  Clinical  correlation with patient history and other diagnostic information is  necessary to determine patient infection status.  Positive results do  not rule out bacterial infection or co-infection with other viruses. If result is PRESUMPTIVE POSTIVE SARS-CoV-2 nucleic acids MAY BE PRESENT.   A presumptive positive result was obtained on the submitted specimen  and confirmed on repeat testing.  While 2019 novel coronavirus  (SARS-CoV-2) nucleic acids may be present in the submitted sample  additional confirmatory testing may be necessary for epidemiological  and / or clinical management purposes  to differentiate between  SARS-CoV-2 and other Sarbecovirus currently known to infect humans.  If clinically indicated additional testing with an alternate test  methodology [phone]) is advised. The SARS-CoV-2 RNA is generally  detectable in upper and lower respiratory sp ecimens during the acute  phase of infection. The expected result is Negative. Fact  Sheet for Patients:  StrictlyIdeas.no Fact Sheet for Healthcare Providers: BankingDealers.co.za This test is not yet approved or cleared by the [place] FDA and has been authorized for detection and/or diagnosis of SARS-CoV-2 by FDA under an Emergency Use Authorization (EUA).  This EUA will remain in effect (meaning this test can be used) for the duration of the COVID-19 declaration under Section 564(b)(1) of the Act, 21 U.S.C. section 360bbb-3(b)(1), unless the authorization is terminated or revoked sooner. Performed at [facility], [place] [address], [place], [place] [address]   MRSA PCR Screening     Status: Abnormal   Collection Time: 09/09/18 12:44 AM   Specimen: Nasopharyngeal  Result Value Ref Range Status   MRSA by PCR POSITIVE (A) NEGATIVE Final    Comment: CRITICAL RESULT CALLED TO, READ BACK BY AND VERIFIED WITH: RN Livingston Diones PQ:[id number] @[id number]  THANEY Performed at [facility], [place] [address], [place], [place] [address]      Labs: BNP (last 3 results) Recent Labs    09/09/18 0031  BNP [id number]   Basic Metabolic Panel: Recent Labs  Lab 09/11/18 0617 09/12/18 0714 09/13/18 0409 09/14/18 0558 09/15/18 0429  NA 135 131* 130* 137 132*  K 4.6 5.1 5.6* 4.6 5.2*  CL 96* 93* 91* 97* 94*  CO2 24 22 22 26 23   GLUCOSE 204* 200* 250* 203* 194*  BUN 40* 70* 98* 45* 73*  CREATININE 4.57* 6.63* 8.28* 4.48* 6.34*  CALCIUM 9.1 8.6* 8.4* 8.2* 7.9*   Liver Function Tests: No results for input(s): AST, ALT, ALKPHOS, BILITOT, PROT, ALBUMIN in the last 168 hours. No results for input(s): LIPASE, AMYLASE in the last 168 hours. No results for input(s): AMMONIA in the last 168 hours. CBC: Recent Labs  Lab 09/10/18 0525 09/10/18 0815 09/13/18 0744 09/15/18 1315  WBC 5.6 6.3 13.1* 16.0*  HGB 8.9* 8.5* 8.7* 8.5*  HCT 28.8* 27.9* 27.9* 27.9*  MCV 96.3 97.9 95.9 97.2  PLT 148* 169 171 198   Cardiac Enzymes: No  results for input(s): CKTOTAL, CKMB, CKMBINDEX, TROPONINI in the last 168 hours. BNP: Invalid input(s): POCBNP CBG: Recent Labs  Lab 09/15/18 1208 09/15/18 1803 09/15/18 2140 09/16/18 0759 09/16/18 1222  GLUCAP 261* 103* 236* 105* 147*   D-Dimer No results for input(s): DDIMER in the last 72 hours. Hgb A1c No results for input(s): HGBA1C in the last 72 hours. Lipid Profile No results for input(s): CHOL, HDL, LDLCALC, TRIG, CHOLHDL, LDLDIRECT in the last 72 hours. Thyroid function studies No results for input(s): TSH, T4TOTAL, T3FREE, THYROIDAB in the last 72 hours.  Invalid input(s): FREET3 Anemia work up No results for input(s): VITAMINB12, FOLATE, FERRITIN, TIBC, IRON, RETICCTPCT in the last 72 hours. Urinalysis    Component Value Date/Time   COLORURINE YELLOW 04/20/2018 0301   APPEARANCEUR HAZY (A) 04/20/2018 0301   LABSPEC 1.009 04/20/2018 0301   PHURINE 9.0 (H) 04/20/2018 0301   GLUCOSEU 150 (A) 04/20/2018 0301   HGBUR MODERATE (A) 04/20/2018 0301   BILIRUBINUR NEGATIVE 04/20/2018 0301   KETONESUR NEGATIVE 04/20/2018 0301   PROTEINUR 100 (A) 04/20/2018 0301   NITRITE NEGATIVE 04/20/2018 0301   LEUKOCYTESUR NEGATIVE 04/20/2018 0301   Sepsis Labs Invalid input(s): PROCALCITONIN,  WBC,  LACTICIDVEN Microbiology Recent Results (from the past 240 hour(s))  Culture, blood (Routine X 2) w Reflex to ID Panel     Status: None   Collection Time: 09/08/18  2:40 PM   Specimen: BLOOD RIGHT FOREARM  Result Value Ref Range Status   Specimen Description BLOOD RIGHT FOREARM  Final   Special Requests   Final    BOTTLES DRAWN AEROBIC ONLY Blood Culture results may not be optimal due to an inadequate volume of blood received in culture bottles   Culture   Final    NO GROWTH 5 DAYS Performed at [facility], [place] [address], [place], [place] [address]    Report Status 09/13/2018 FINAL  Final  Culture, blood (Routine X 2) w Reflex to ID Panel     Status: None   Collection  Time: 09/08/18  3:14 PM   Specimen: BLOOD  Result Value Ref Range Status   Specimen Description BLOOD SITE NOT SPECIFIED  Final   Special Requests   Final    BOTTLES DRAWN AEROBIC AND ANAEROBIC Blood Culture results may not be optimal due to an inadequate volume of blood received in culture bottles   Culture   Final    NO GROWTH 5 DAYS Performed at [facility], [place] [address] [place], [place] [address]    Report Status 09/13/2018 FINAL  Final  SARS Coronavirus 2 [employer] [employer] order, Performed in [facility] hospital lab) Nasopharyngeal Nasopharyngeal Swab     Status: None   Collection Time: 09/08/18  4:10 PM   Specimen: Nasopharyngeal Swab  Result Value Ref Range Status   SARS Coronavirus 2 NEGATIVE NEGATIVE Final    Comment: (NOTE) If result is NEGATIVE SARS-CoV-2 target nucleic acids are NOT DETECTED. The SARS-CoV-2 RNA is generally detectable in upper and lower  respiratory specimens during the acute phase of infection. The lowest  concentration of SARS-CoV-2 viral copies this assay can detect is 250  copies / mL. A negative result does not preclude SARS-CoV-2 infection  and should not be used as the sole basis for treatment or other  patient management decisions.  A negative result may occur with  improper specimen collection / handling, submission of specimen other  than nasopharyngeal swab, presence of viral mutation(s) within the  areas targeted by this assay, and inadequate number of viral copies  (<250 copies / mL). A negative result must be combined with clinical  observations, patient history, and epidemiological information. If result is POSITIVE  SARS-CoV-2 target nucleic acids are DETECTED. The SARS-CoV-2 RNA is generally detectable in upper and lower  respiratory specimens dur ing the acute phase of infection.  Positive  results are indicative of active infection with SARS-CoV-2.  Clinical  correlation with patient history and other diagnostic information is   necessary to determine patient infection status.  Positive results do  not rule out bacterial infection or co-infection with other viruses. If result is PRESUMPTIVE POSTIVE SARS-CoV-2 nucleic acids MAY BE PRESENT.   A presumptive positive result was obtained on the submitted specimen  and confirmed on repeat testing.  While 2019 novel coronavirus  (SARS-CoV-2) nucleic acids may be present in the submitted sample  additional confirmatory testing may be necessary for epidemiological  and / or clinical management purposes  to differentiate between  SARS-CoV-2 and other Sarbecovirus currently known to infect humans.  If clinically indicated additional testing with an alternate test  methodology [phone]) is advised. The SARS-CoV-2 RNA is generally  detectable in upper and lower respiratory sp ecimens during the acute  phase of infection. The expected result is Negative. Fact Sheet for Patients:  StrictlyIdeas.no Fact Sheet for Healthcare Providers: BankingDealers.co.za This test is not yet approved or cleared by the [place] FDA and has been authorized for detection and/or diagnosis of SARS-CoV-2 by FDA under an Emergency Use Authorization (EUA).  This EUA will remain in effect (meaning this test can be used) for the duration of the COVID-19 declaration under Section 564(b)(1) of the Act, 21 U.S.C. section 360bbb-3(b)(1), unless the authorization is terminated or revoked sooner. Performed at [facility], [place] [address] [place], [place] [address]   MRSA PCR Screening     Status: Abnormal   Collection Time: 09/09/18 12:44 AM   Specimen: Nasopharyngeal  Result Value Ref Range Status   MRSA by PCR POSITIVE (A) NEGATIVE Final    Comment: CRITICAL RESULT CALLED TO, READ BACK BY AND VERIFIED WITH: RN Livingston Diones [id number] @[id number]  THANEY Performed at [facility], [place] [address], [place],  [address]      Time  coordinating discharge: 35 minutes  SIGNED:   Cordelia Poche, MD [facility] 09/16/2018, 3:56 PM

## 2018-09-16 NOTE — Progress Notes (Signed)
Patient called RN to room requesting to be taken off of BiPAP. Pt placed on 14L HFNC O2 sats 92% while asleep. Respiratory notified.

## 2018-09-16 NOTE — Progress Notes (Signed)
Occupational Therapy Treatment Patient Details Name: Joan Mosley MRN: [id number] DOB: 10-May-1945 Today's Date: 09/16/2018    History of present illness Patient is a 73 y/o female presenting with weakness. past medical history significant for ESRD on dialysis Tuesday, Thursday, Saturday, morbid obesity, type 2 diabetes with recent admission for subdural hematoma. Admitted for Acute on chronic hypoxemic respiratory failure.    OT comments  Pt making slow progress with functional goals. Pt required min A + 2 for bed mobility to sit EOB, Used Stedy to transfer to recliner form EOB. Pt declined simple grooming, BSC transfer and UB dressing stating the she was too fatigued. OT will continue to follow acutely  Follow Up Recommendations  SNF;Supervision/Assistance - 24 hour;Other (comment)(will need max HH services, family refusing SNF)    Equipment Recommendations  None recommended by OT    Recommendations for Other Services      Precautions / Restrictions Precautions Precautions: Fall Restrictions Weight Bearing Restrictions: No       Mobility Bed Mobility Overal bed mobility: Needs Assistance Bed Mobility: Supine to Sit     Supine to sit: Min assist;+2 for physical assistance;HOB elevated     General bed mobility comments: Assist to bring legs off of bed, elevate trunk into sitting and bring hips to EOB.   Transfers Overall transfer level: Needs assistance Equipment used: Ambulation equipment used Transfers: Sit to/from [facility] Sit to Stand: +2 physical assistance;Min assist         General transfer comment: Assist to bring hips up. Used Stedy to move from bed to chair. Wanted to stand from chair with walker but pt reported she was too fatigued.    Balance Overall balance assessment: Needs assistance Sitting-balance support: Feet supported;No upper extremity supported Sitting balance-Leahy Scale: Fair     Standing balance support: Bilateral  upper extremity supported;During functional activity Standing balance-Leahy Scale: Poor Standing balance comment: Stedy and +2 min assist for static standing                           ADL either performed or assessed with clinical judgement   ADL                           Toilet Transfer: Minimal assistance;+2 for physical assistance;+2 for safety/equipment Toilet Transfer Details (indicate cue type and reason): simulated to recliner, declined BSC           General ADL Comments: pt refused simple grooming and UB dressing tasks stating "I'm too tired"     Vision Patient Visual Report: No change from baseline     Perception     Praxis      Cognition Arousal/Alertness: Awake/alert Behavior During Therapy: WFL for tasks assessed/performed Overall Cognitive Status: History of cognitive impairments - at baseline Area of Impairment: Memory                     Memory: Decreased short-term memory                  Exercises     Shoulder Instructions       General Comments Pt on 6L of O2. SpO2 appeared to be in mid 80's but difficult to assess due to problems with monitor. Brought in second monitor with SpO2 reading in the mid to upper 80's    Pertinent Vitals/ Pain       Pain Assessment: Faces  Faces Pain Scale: Hurts little more Pain Location: generalized Pain Descriptors / Indicators: Sore;Discomfort Pain Intervention(s): Limited activity within patient's tolerance;Monitored during session;Repositioned  Home Living                                          Prior Functioning/Environment              Frequency  Min 2X/week        Progress Toward Goals  OT Goals(current goals can now be found in the care plan section)  Progress towards OT goals: Progressing toward goals     Plan Discharge plan remains appropriate    Co-evaluation    PT/OT/SLP Co-Evaluation/Treatment: Yes Reason for Co-Treatment:  Complexity of the patient's impairments (multi-system involvement);For patient/therapist safety PT goals addressed during session: Mobility/safety with mobility OT goals addressed during session: ADL's and self-care;Proper use of Adaptive equipment and DME      AM-PAC OT "6 Clicks" Daily Activity     Outcome Measure   Help from another person eating meals?: None Help from another person taking care of personal grooming?: A Little Help from another person toileting, which includes using toliet, bedpan, or urinal?: Total Help from another person bathing (including washing, rinsing, drying)?: A Lot Help from another person to put on and taking off regular upper body clothing?: A Lot Help from another person to put on and taking off regular lower body clothing?: Total 6 Click Score: 13    End of Session Equipment Utilized During Treatment: Gait belt  OT Visit Diagnosis: Unsteadiness on feet (R26.81);Muscle weakness (generalized) (M62.81)   Activity Tolerance Patient limited by fatigue   Patient Left with bed alarm set;with family/visitor present;in chair   Nurse Communication          Time: [id number] OT Time Calculation (min): 22 min  Charges: OT General Charges $OT Visit: 1 Visit OT Treatments $Therapeutic Activity: 8-22 mins     Britt Bottom 09/16/2018, 3:08 PM

## 2018-09-16 NOTE — Progress Notes (Signed)
  Echocardiogram 2D Echocardiogram has been performed.  Joan Mosley 09/16/2018, 8:54 AM

## 2018-09-16 NOTE — Care Management Important Message (Signed)
Important Message  Patient Details  Name: Joan Mosley MRN: [id number] Date of Birth: [birth year]   Medicare Important Message Given:  Yes     [name] 09/16/2018, 4:06 PM

## 2018-09-16 NOTE — TOC Transition Note (Signed)
Transition of Care [facility]) - CM/SW Discharge Note   Patient Details  Name: Joan Mosley MRN: [id number] Date of Birth: 05-31-1945  Transition of Care [facility]) CM/SW Contact:  Geralynn Ochs, LCSW Phone Number: 09/16/2018, 4:59 PM   Clinical Narrative:   CSW alerted that patient is being discharged today. Patient is on higher oxygen than at baseline; contacted patient's son to ensure that her oxygen supplies at home could accommodate 6L (on 5L at baseline). Was not previously on high flow, will need high flow tubing. CSW contacted [facility], provided order for home oximyzer. They cannot guarantee that oxygen supplies will be out tonight but will try.   CSW alerted by patient's son that hemodialysis unit can only accommodate 5L. CSW reached out to staff issue with renal coordinator, who contacted her hemodialysis center. Per the hemodialysis staff, their concentrator only measures up to 5L, but they can push it up above 5L, they just can't guarantee what the measure is above 5L. CSW provided information to patient's son, and he is satisfied with that arrangement. CSW alerted MD.   CSW confirmed with son that they had arranged for transportation to patient's hemodialysis session for tomorrow. Son also asked about any assistance with getting the patient's levemir, as she has ran out and can't afford to refill it until next month. CSW staffed with [place] to see the copay; copay will be $286 for a vial because the patient does not have any drug coverage. CSW updated patient's son, and he said to send a refill to the [facility] and the family will figure it out. Family just wants the patient home.  CSW reached out to RN to discuss sending extra tubing supplies home with the patient and was alerted that RN had already contacted [facility] without notifying CSW. CSW contacted [facility] to update them with the patient's lot number so they can take her to the correct address.    Final next level of care: Home w  Home Health Services Barriers to Discharge: Barriers Resolved   Patient Goals and CMS Choice Patient states their goals for this hospitalization and ongoing recovery are:: Per MD, pt is agreeable to SNF CMS Medicare.gov Compare Post Acute Care list provided to:: Patient Choice offered to / list presented to : Patient  Discharge Placement                Patient to be transferred to facility by: [place] Name of family member notified: [name] Patient and family notified of of transfer: 09/16/18  Discharge Plan and Services In-house Referral: Clinical Social Work Discharge Planning Services: NA Post Acute Care Choice: [place]          DME Arranged: Oxygen DME Agency: [facility] Date DME Agency Contacted: 09/16/18 Time DME Agency Contacted: [phone]   HH Arranged: NA HH Agency: [place] Date [facility] Agency Contacted: 09/16/18 Time [place]: 1658 Representative spoke with at [place]: [place] ([place]) Interventions     Readmission Risk Interventions No flowsheet data found.

## 2018-09-16 NOTE — Progress Notes (Signed)
Physical Therapy Treatment Patient Details Name: Joan Mosley MRN: [id number] DOB: July 20, 1945 Today's Date: 09/16/2018    History of Present Illness Patient is a 73 y/o female presenting with weakness. past medical history significant for ESRD on dialysis Tuesday, Thursday, Saturday, morbid obesity, type 2 diabetes with recent admission for subdural hematoma. Admitted for Acute on chronic hypoxemic respiratory failure.     PT Comments    Pt making slow progress. Used Stedy to get to chair but pt stood well enough that I expect she could have pivoted without the [place]. Attempted to have her stand from the recliner but she refused due to fatigue. Pt/family plan to take her home instead of SNF so she will need [facility] services.    Follow Up Recommendations  SNF;Supervision/Assistance - 24 hour(Pt/family refusing so will need HHPT)     Equipment Recommendations  None recommended by PT    Recommendations for Other Services       Precautions / Restrictions Precautions Precautions: Fall Restrictions Weight Bearing Restrictions: No    Mobility  Bed Mobility Overal bed mobility: Needs Assistance Bed Mobility: Supine to Sit     Supine to sit: Min assist;+2 for physical assistance;HOB elevated     General bed mobility comments: Assist to bring legs off of bed, elevate trunk into sitting and bring hips to EOB.   Transfers Overall transfer level: Needs assistance Equipment used: Ambulation equipment used Transfers: Sit to/from [facility] Sit to Stand: +2 physical assistance;Min assist         General transfer comment: Assist to bring hips up. Used Stedy to move from bed to chair. Wanted to stand from chair with walker but pt reported she was too fatigued.  Ambulation/Gait                 Stairs             Wheelchair Mobility    Modified Rankin (Stroke Patients Only)       Balance Overall balance assessment: Needs  assistance Sitting-balance support: Feet supported;No upper extremity supported Sitting balance-Leahy Scale: Fair     Standing balance support: Bilateral upper extremity supported Standing balance-Leahy Scale: Poor Standing balance comment: Stedy and +2 min assist for static standing                            Cognition Arousal/Alertness: Awake/alert Behavior During Therapy: WFL for tasks assessed/performed Overall Cognitive Status: History of cognitive impairments - at baseline Area of Impairment: Memory                     Memory: Decreased short-term memory                Exercises      General Comments General comments (skin integrity, edema, etc.): Pt on 6L of O2. SpO2 appeared to be in mid 80's but difficult to assess due to problems with monitor. Brought in second monitor with SpO2 reading in the mid to upper 80's      Pertinent Vitals/Pain Pain Assessment: No/denies pain    Home Living                      Prior Function            PT Goals (current goals can now be found in the care plan section) Progress towards PT goals: Progressing toward goals    Frequency    Min  3X/week      PT Plan Current plan remains appropriate;Frequency needs to be updated    Co-evaluation PT/OT/SLP Co-Evaluation/Treatment: Yes Reason for Co-Treatment: Complexity of the patient's impairments (multi-system involvement);For patient/therapist safety PT goals addressed during session: Mobility/safety with mobility        AM-PAC PT "6 Clicks" Mobility   Outcome Measure  Help needed turning from your back to your side while in a flat bed without using bedrails?: A Lot Help needed moving from lying on your back to sitting on the side of a flat bed without using bedrails?: A Lot Help needed moving to and from a bed to a chair (including a wheelchair)?: Total Help needed standing up from a chair using your arms (e.g., wheelchair or bedside  chair)?: A Lot Help needed to walk in hospital room?: Total Help needed climbing 3-5 steps with a railing? : Total 6 Click Score: 9    End of Session Equipment Utilized During Treatment: Oxygen;Gait belt Activity Tolerance: Patient limited by fatigue Patient left: in chair;with call bell/phone within reach;with family/visitor present Nurse Communication: Mobility status;Need for lift equipment PT Visit Diagnosis: Unsteadiness on feet (R26.81);Repeated falls (R29.6);Muscle weakness (generalized) (M62.81)     Time: [id number] PT Time Calculation (min) (ACUTE ONLY): 24 min  Charges:  $Therapeutic Activity: 8-22 mins                     [place] Pager [phone] Office [place] 09/16/2018, 2:12 PM

## 2018-09-16 NOTE — TOC Progression Note (Signed)
LATE NOTE SUBMISSION     Transition of Care [facility]) - Progression Note    Patient Details  Name: Joan Mosley MRN: [id number] Date of Birth: [birth year]  Transition of Care [facility]) CM/SW [place], [place] Phone Number: 09/16/2018, 10:15 AM  Clinical Narrative:  CSW alerted by RN that patient's son, [name], was at the bedside and would like to discuss discharge. CSW met with patient and [name] at bedside. Patient and family are refusing SNF, they would prefer to get her home, but they are concerned about how to get her to HD because the family has been unable to get her in a car (they had previously been providing transportation). [name] asking about transport by [employer]. CSW staffed case with Renal Navigator for assistance; patient will not qualify for [facility] transport but may qualify for other city or county transportation. Renal Navigator to investigate and contact CSW back with options.  Renal Navigator contacted CSW back about [facility] non-Medicaid transport, where patient can be transported via wheelchair. Family had previously had a lift delivered to the house on previous admission. CSW met with patient to discuss option, and patient said that her children should be able to get her up with the lift into a wheelchair no problem. Patient agreeable for Renal Navigator to meet with her to fill out the transportation request paperwork. CSW spoke with [name] via phone to explain information to him, and [name] is also agreeable to transportation option. Renal Navigator to follow up on transportation, and CSW to follow on discharging patient home when stable.    Expected Discharge Plan: [place] Barriers to Discharge: Continued Medical Work up  Expected Discharge Plan and Services Expected Discharge Plan: [place] In-house Referral: Clinical Social Work Discharge Planning Services: NA Post Acute Care Choice: [place] Living  arrangements for the past 2 months: Single Family Home                 DME Arranged: N/A DME Agency: NA       HH Arranged: NA HH Agency: NA         Social Determinants of Health (SDOH) Interventions    Readmission Risk Interventions No flowsheet data found.

## 2018-09-19 ENCOUNTER — Telehealth: Payer: Self-pay | Admitting: Behavioral Health

## 2018-09-19 NOTE — Telephone Encounter (Signed)
Patient declines TCM/Hospital Follow-up. She voiced that she's feeling fine and does not see the need to come in at this time; if anything changes she will give the office a call to schedule an appointment.

## 2018-09-22 ENCOUNTER — Telehealth: Payer: Self-pay | Admitting: Nurse Practitioner

## 2018-09-22 NOTE — Telephone Encounter (Signed)
Ok to order 

## 2018-09-22 NOTE — Telephone Encounter (Signed)
Please advise 

## 2018-09-22 NOTE — Telephone Encounter (Signed)
Home Health Verbal Orders - Caller/Agency: Denise/ [facility] Requesting  Nursing Social Work Frequency: Nursing 1x4 social work 2 visits

## 2018-09-23 ENCOUNTER — Other Ambulatory Visit: Payer: Self-pay

## 2018-09-23 ENCOUNTER — Inpatient Hospital Stay (HOSPITAL_COMMUNITY)
Admission: EM | Admit: 2018-09-23 | Discharge: 2018-10-04 | DRG: 189 | Disposition: A | Discharge status: Other Acute Inpatient Hospital | Payer: Medicare Other | Attending: Family Medicine | Admitting: Family Medicine

## 2018-09-23 ENCOUNTER — Encounter (HOSPITAL_COMMUNITY): Payer: Self-pay | Admitting: Emergency Medicine

## 2018-09-23 DIAGNOSIS — Z888 Allergy status to other drugs, medicaments and biological substances status: Secondary | ICD-10-CM

## 2018-09-23 DIAGNOSIS — J811 Chronic pulmonary edema: Secondary | ICD-10-CM | POA: Diagnosis present

## 2018-09-23 DIAGNOSIS — Z79890 Hormone replacement therapy (postmenopausal): Secondary | ICD-10-CM

## 2018-09-23 DIAGNOSIS — J9611 Chronic respiratory failure with hypoxia: Secondary | ICD-10-CM

## 2018-09-23 DIAGNOSIS — J9601 Acute respiratory failure with hypoxia: Secondary | ICD-10-CM | POA: Diagnosis not present

## 2018-09-23 DIAGNOSIS — Z7401 Bed confinement status: Secondary | ICD-10-CM

## 2018-09-23 DIAGNOSIS — F419 Anxiety disorder, unspecified: Secondary | ICD-10-CM

## 2018-09-23 DIAGNOSIS — Z992 Dependence on renal dialysis: Secondary | ICD-10-CM

## 2018-09-23 DIAGNOSIS — J9811 Atelectasis: Secondary | ICD-10-CM | POA: Diagnosis present

## 2018-09-23 DIAGNOSIS — Z9181 History of falling: Secondary | ICD-10-CM

## 2018-09-23 DIAGNOSIS — Z87891 Personal history of nicotine dependence: Secondary | ICD-10-CM

## 2018-09-23 DIAGNOSIS — E1122 Type 2 diabetes mellitus with diabetic chronic kidney disease: Secondary | ICD-10-CM | POA: Diagnosis present

## 2018-09-23 DIAGNOSIS — E8779 Other fluid overload: Secondary | ICD-10-CM | POA: Diagnosis present

## 2018-09-23 DIAGNOSIS — Z87892 Personal history of anaphylaxis: Secondary | ICD-10-CM

## 2018-09-23 DIAGNOSIS — Z79899 Other long term (current) drug therapy: Secondary | ICD-10-CM

## 2018-09-23 DIAGNOSIS — Z8249 Family history of ischemic heart disease and other diseases of the circulatory system: Secondary | ICD-10-CM

## 2018-09-23 DIAGNOSIS — Z882 Allergy status to sulfonamides status: Secondary | ICD-10-CM

## 2018-09-23 DIAGNOSIS — R627 Adult failure to thrive: Secondary | ICD-10-CM | POA: Diagnosis present

## 2018-09-23 DIAGNOSIS — E785 Hyperlipidemia, unspecified: Secondary | ICD-10-CM | POA: Diagnosis present

## 2018-09-23 DIAGNOSIS — J9621 Acute and chronic respiratory failure with hypoxia: Secondary | ICD-10-CM | POA: Diagnosis not present

## 2018-09-23 DIAGNOSIS — N186 End stage renal disease: Secondary | ICD-10-CM | POA: Diagnosis present

## 2018-09-23 DIAGNOSIS — Z880 Allergy status to penicillin: Secondary | ICD-10-CM

## 2018-09-23 DIAGNOSIS — Z794 Long term (current) use of insulin: Secondary | ICD-10-CM

## 2018-09-23 DIAGNOSIS — I110 Hypertensive heart disease with heart failure: Secondary | ICD-10-CM | POA: Diagnosis present

## 2018-09-23 DIAGNOSIS — I878 Other specified disorders of veins: Secondary | ICD-10-CM | POA: Diagnosis present

## 2018-09-23 DIAGNOSIS — E1142 Type 2 diabetes mellitus with diabetic polyneuropathy: Secondary | ICD-10-CM

## 2018-09-23 DIAGNOSIS — R197 Diarrhea, unspecified: Secondary | ICD-10-CM

## 2018-09-23 DIAGNOSIS — Z9981 Dependence on supplemental oxygen: Secondary | ICD-10-CM

## 2018-09-23 DIAGNOSIS — Z6841 Body Mass Index (BMI) 40.0 and over, adult: Secondary | ICD-10-CM

## 2018-09-23 DIAGNOSIS — Z96653 Presence of artificial knee joint, bilateral: Secondary | ICD-10-CM | POA: Diagnosis present

## 2018-09-23 DIAGNOSIS — E875 Hyperkalemia: Secondary | ICD-10-CM | POA: Diagnosis present

## 2018-09-23 DIAGNOSIS — G4733 Obstructive sleep apnea (adult) (pediatric): Secondary | ICD-10-CM | POA: Diagnosis present

## 2018-09-23 DIAGNOSIS — Z20828 Contact with and (suspected) exposure to other viral communicable diseases: Secondary | ICD-10-CM | POA: Diagnosis present

## 2018-09-23 DIAGNOSIS — E1165 Type 2 diabetes mellitus with hyperglycemia: Secondary | ICD-10-CM | POA: Diagnosis present

## 2018-09-23 DIAGNOSIS — D631 Anemia in chronic kidney disease: Secondary | ICD-10-CM | POA: Diagnosis present

## 2018-09-23 DIAGNOSIS — Z87820 Personal history of traumatic brain injury: Secondary | ICD-10-CM

## 2018-09-23 DIAGNOSIS — Z66 Do not resuscitate: Secondary | ICD-10-CM | POA: Diagnosis present

## 2018-09-23 DIAGNOSIS — Z993 Dependence on wheelchair: Secondary | ICD-10-CM

## 2018-09-23 DIAGNOSIS — Z885 Allergy status to narcotic agent status: Secondary | ICD-10-CM

## 2018-09-23 DIAGNOSIS — E1151 Type 2 diabetes mellitus with diabetic peripheral angiopathy without gangrene: Secondary | ICD-10-CM | POA: Diagnosis present

## 2018-09-23 DIAGNOSIS — E890 Postprocedural hypothyroidism: Secondary | ICD-10-CM | POA: Diagnosis present

## 2018-09-23 DIAGNOSIS — I2781 Cor pulmonale (chronic): Secondary | ICD-10-CM | POA: Diagnosis present

## 2018-09-23 DIAGNOSIS — Z515 Encounter for palliative care: Secondary | ICD-10-CM | POA: Diagnosis present

## 2018-09-23 DIAGNOSIS — J441 Chronic obstructive pulmonary disease with (acute) exacerbation: Secondary | ICD-10-CM | POA: Diagnosis present

## 2018-09-23 DIAGNOSIS — F329 Major depressive disorder, single episode, unspecified: Secondary | ICD-10-CM | POA: Diagnosis present

## 2018-09-23 DIAGNOSIS — D696 Thrombocytopenia, unspecified: Secondary | ICD-10-CM | POA: Diagnosis present

## 2018-09-23 DIAGNOSIS — Z8673 Personal history of transient ischemic attack (TIA), and cerebral infarction without residual deficits: Secondary | ICD-10-CM

## 2018-09-23 DIAGNOSIS — I953 Hypotension of hemodialysis: Secondary | ICD-10-CM

## 2018-09-23 DIAGNOSIS — J9622 Acute and chronic respiratory failure with hypercapnia: Secondary | ICD-10-CM | POA: Diagnosis present

## 2018-09-23 DIAGNOSIS — I5033 Acute on chronic diastolic (congestive) heart failure: Secondary | ICD-10-CM | POA: Diagnosis present

## 2018-09-23 DIAGNOSIS — E039 Hypothyroidism, unspecified: Secondary | ICD-10-CM | POA: Diagnosis present

## 2018-09-23 DIAGNOSIS — I9589 Other hypotension: Secondary | ICD-10-CM | POA: Diagnosis present

## 2018-09-23 LAB — CBC WITH DIFFERENTIAL/PLATELET
Abs Immature Granulocytes: 0.03 10*3/uL (ref 0.00–0.07)
Basophils Absolute: 0 10*3/uL (ref 0.0–0.1)
Basophils Relative: 0 %
Eosinophils Absolute: 0.2 10*3/uL (ref 0.0–0.5)
Eosinophils Relative: 2 %
HCT: 28.9 % — ABNORMAL LOW (ref 36.0–46.0)
Hemoglobin: 8.6 g/dL — ABNORMAL LOW (ref 12.0–15.0)
Immature Granulocytes: 0 %
Lymphocytes Relative: 14 %
Lymphs Abs: 1.1 10*3/uL (ref 0.7–4.0)
MCH: 29.9 pg (ref 26.0–34.0)
MCHC: 29.8 g/dL — ABNORMAL LOW (ref 30.0–36.0)
MCV: 100.3 fL — ABNORMAL HIGH (ref 80.0–100.0)
Monocytes Absolute: 0.6 10*3/uL (ref 0.1–1.0)
Monocytes Relative: 8 %
Neutro Abs: 6.1 10*3/uL (ref 1.7–7.7)
Neutrophils Relative %: 76 %
Platelets: 143 10*3/uL — ABNORMAL LOW (ref 150–400)
RBC: 2.88 MIL/uL — ABNORMAL LOW (ref 3.87–5.11)
RDW: 16.7 % — ABNORMAL HIGH (ref 11.5–15.5)
WBC: 8 10*3/uL (ref 4.0–10.5)
nRBC: 0 % (ref 0.0–0.2)

## 2018-09-23 LAB — BASIC METABOLIC PANEL
Anion gap: 20 — ABNORMAL HIGH (ref 5–15)
BUN: 54 mg/dL — ABNORMAL HIGH (ref 8–23)
CO2: 25 mmol/L (ref 22–32)
Calcium: 8 mg/dL — ABNORMAL LOW (ref 8.9–10.3)
Chloride: 90 mmol/L — ABNORMAL LOW (ref 98–111)
Creatinine, Ser: 7.87 mg/dL — ABNORMAL HIGH (ref 0.44–1.00)
GFR calc Af Amer: 5 mL/min — ABNORMAL LOW (ref >60)
GFR calc non Af Amer: 5 mL/min — ABNORMAL LOW (ref >60)
Glucose, Bld: 207 mg/dL — ABNORMAL HIGH (ref 70–99)
Potassium: 5.4 mmol/L — ABNORMAL HIGH (ref 3.5–5.1)
Sodium: 135 mmol/L (ref 135–145)

## 2018-09-23 LAB — MAGNESIUM: Magnesium: 2.6 mg/dL — ABNORMAL HIGH (ref 1.7–2.4)

## 2018-09-23 NOTE — Consult Note (Signed)
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 PCCM consulted for further recommendations.   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 She endorsed 3-4 episodes of loose stools with weakness. She is unable to tell me if she had any blood in her stools. She has HD on TThS schedule, missed Tuesday but received HD on Thursday. In the ED she c/o SOB and requested to take 2 puffs of her own inhaler. SpO2 63% on her baseline 6lpm. She was placed on 12lpm high flow in ED. O2 sat improved to 89%. She denies fevers or chills. She reports her breathing is at her baseline. Denies chest pain, N/V, abdominal pain. PCCM consulted for further recommendations.   Of note per palliative care note 09/16/2018 "New MOST form was completed with following elections:              DNR,              Limited scope interventions- rehospitalization is acceptable, continue dialysis             No IV fluids, no feeding tube             Determine use or limitations of antibiotics when infection occurs"  Pt also has Gold form DNR.   She has both DNR form and MOST form with her.  Past Medical History  Morbid Obesity - BMI 45  ESRD - HD T,Th,S DM II  SDH - discharged  09/07/18 Chronic Hypotension - on midodrine  COPD  Former Smoker - quit 1983, > 1ppd for 22 years. Husband was a smoker and currently lives with a smoker.  Chronic hypoxic respiratory failure on 6lpm O2 Severe PH OSA  Significant Hospital Events   NA  Consults:  PCCM   Procedures:  NA  Significant Diagnostic Tests:  NA  Micro Data:  C diff 8/28>> Stool 8/28>>  Antimicrobials:  NA  Interim history/subjective:  Reports generalized weakness. No further episodes of loose stools.  Objective   Blood pressure 140/85, pulse (!) 101, temperature 99 F (37.2 C), temperature source Oral, resp. rate (!) 36, height 5\' 1"  (1.549 m), weight 108.9 kg, SpO2 (!) 85 %.       No intake or output data in the 24 hours ending 09/23/18 2350 Filed Weights   09/23/18 2242  Weight: 108.9 kg    Examination: General: NAD HEENT: MM pink/moist, poor dentition Neuro: Awake, alert, oriented to self, time and place. Long term memory difficulties. MAE.  CV: s1s2 rrr, no m/r/g PULM:  Even/non-labored, lungs bilaterally clear anterior, diminished bases  GI: soft, bsx4 active  Extremities: warm/dry, 1-2+ BLE edema, chronic venous stasis changes in BLE Skin: warm   Assessment & Plan:  73yo F PMH chronic hypoxic respiratory failure secondary to end  stage WHO Groups 2/3 (COPD/OSA) cor pulmonale, baseline O2 6lpm, near bedbound, ESRD on HD, Morbid obesity, SDH-d/c from [facility] 09/07/2018 for same, also hospitalized  8/13/8/21/2020 for COPD exacerbation and A/C resp failure presented with generalized weakness and diarrhea.   Generalized weakness and diarrhea: afebrile and mildly tachycardic. No hypotension. No leukocytosis. --Can follow up on stool studies and start treatment as appropriate. Per MOST form she is okay with limited antibiotics if needed.  Chronic hypoxic respiratory failure, COPD, suspected OSA: O2 sat goal was lowered at last hospitalization to facilitate her ability to go home with her goals  of care. She is not in COPD exacerbation as her breathing/cough is at baseline. --While she is here would target O2 sat of 88% which is currently achievable with HFNC. --BiPAP QHS and prn sleep --Can do Brovana, Pulmicort neb BID, ipratropium neb Q6hrs --Albuterol q4 hrs prn dyspnea/wheezing --Continue Singulair  Best practice:  Diet: per primary Pain/Anxiety/Delirium protocol (if indicated): NA VAP protocol (if indicated): NA DVT prophylaxis: per primary GI prophylaxis: NA Glucose control: per primary Mobility: bedrest/bedbound at baseline Code Status: DNR/DNI Family Communication: per primary  Disposition: appropriate for step down  Labs   CBC: Recent Labs  Lab 09/23/18 2320  WBC 8.0  NEUTROABS 6.1  HGB 8.6*  HCT 28.9*  MCV 100.3*  PLT 143*    Basic Metabolic Panel: Recent Labs  Lab 09/23/18 2320  NA 135  K 5.4*  CL 90*  CO2 25  GLUCOSE 207*  BUN 54*  CREATININE 7.87*  CALCIUM 8.0*  MG 2.6*   GFR: Estimated Creatinine Clearance: 7 mL/min (A) (by C-G formula based on SCr of 7.87 mg/dL (H)). Recent Labs  Lab 09/23/18 2320  WBC 8.0    Liver Function Tests: No results for input(s): AST, ALT, ALKPHOS, BILITOT, PROT, ALBUMIN in the last 168 hours. No results for input(s): LIPASE, AMYLASE in the last 168 hours. No results for input(s): AMMONIA in the last 168 hours.  ABG    Component Value Date/Time   PHART 7.439 08/22/2018 2015   PCO2ART 41.5 08/22/2018 2015   PO2ART 122.0 (H) 08/22/2018 2015   HCO3 30.4 (H) 09/08/2018 1541   TCO2 32 09/08/2018 1541   ACIDBASEDEF 2.0 08/01/2018 0521   O2SAT 98.0 09/08/2018 1541     Coagulation Profile: No results for input(s): INR, PROTIME in the last 168 hours.  Cardiac Enzymes: No results for input(s): CKTOTAL, CKMB, CKMBINDEX, TROPONINI in the last 168 hours.  HbA1C: Hgb A1c MFr Bld  Date/Time Value Ref Range Status  07/01/2018 11:49 AM 6.1 (H) <5.7 % of total Hgb Final    Comment:    For someone  without known diabetes, a hemoglobin  A1c value between 5.7% and 6.4% is consistent with prediabetes and should be confirmed with a  follow-up test. . For someone with known diabetes, a value <7% indicates that their diabetes is well controlled. A1c targets should be individualized based on duration of diabetes, age, comorbid conditions, and other considerations. . This assay result is consistent with an increased risk of diabetes. . Currently, no consensus exists regarding use of hemoglobin A1c for diagnosis of diabetes for children. .     CBG: No results for input(s): GLUCAP in the last 168 hours.  Review of Systems:   Complete 12 point review of system performed and is negative except per HPI.  Past Medical History  She,  has a past medical history of Arthritis, COPD (chronic obstructive pulmonary disease) ([place]), Diabetes mellitus without complication (  [place]), Diabetic neuropathy ([place]), Oxygen dependent (03/30/2018), Renal disorder, Thyroid disease, and TIA (transient ischemic attack).   Surgical History    Past Surgical History:  Procedure Laterality Date  . ABDOMINAL HYSTERECTOMY    . RECTOCELE REPAIR    . REPLACEMENT TOTAL KNEE BILATERAL Bilateral 2008  . THYROIDECTOMY     80%  . VAGINAL WOUND CLOSURE / REPAIR       Social History   reports that she quit smoking about 37 years ago. She has a 35.00 pack-year smoking history. She has never used smokeless tobacco. She reports that she does not drink alcohol or use drugs.   Family History   Her family history includes Heart disease in her father and mother; Hypertension in her father and mother.   Allergies Allergies  Allergen Reactions  . Avelox [Moxifloxacin Hcl In Nacl]     PT does not remember  . Codeine Anaphylaxis    PT does not remember  . Other     PT does not remember  . Penicillins     Rash  . Sulfa Antibiotics     PT does nolt remember  . Shellfish Allergy Hives, Itching and Rash     Home  Medications  Prior to Admission medications   Medication Sig Start Date End Date Taking? Authorizing Provider  acetaminophen (TYLENOL) 500 MG tablet Take 1,000 mg by mouth every 6 (six) hours as needed for mild pain.    [provider]  albuterol (PROVENTIL) (2.5 MG/3ML) 0.083% nebulizer solution Take 3 mLs (2.5 mg total) by nebulization 4 (four) times daily. 09/16/18   Mariel Aloe, MD  albuterol (VENTOLIN HFA) 108 (90 Base) MCG/ACT inhaler Inhale 2 puffs into the lungs every 6 (six) hours as needed for wheezing or shortness of breath.    [provider]  calcitRIOL (ROCALTROL) 0.25 MCG capsule Take 0.25 mcg by mouth daily.    [provider]  clonazePAM (KLONOPIN) 1 MG tablet Take 1 tablet (1 mg total) by mouth 2 (two) times daily as needed for anxiety. Change in dose 08/08/18   Nche, Charlene Brooke, NP  FLUoxetine (PROZAC) 10 MG tablet Take 1 tablet (10 mg total) by mouth daily. 08/08/18   Nche, Charlene Brooke, NP  furosemide (LASIX) 80 MG tablet Take 80 mg by mouth See admin instructions. Sunday, Monday,Wed,Friday--in AM     [provider]  insulin detemir (LEVEMIR) 100 UNIT/ML injection Inject 0.15 mLs (15 Units total) into the skin daily. 09/16/18   Mariel Aloe, MD  insulin lispro (HUMALOG) 100 UNIT/ML injection Inject 1-5 Units into the skin 3 (three) times daily with meals as needed for high blood sugar. Sliding scale     [provider]  ipratropium (ATROVENT) 0.02 % nebulizer solution Inhale 0.5 mg into the lungs 4 (four) times daily.     [provider]  levothyroxine (SYNTHROID) 112 MCG tablet Take 1 tablet (112 mcg total) by mouth daily before breakfast. 1 AM Patient taking differently: Take 112 mcg by mouth daily before breakfast.  07/04/18   Nche, Charlene Brooke, NP  lidocaine (LIDODERM) 5 % Place 1 patch onto the skin every 12 (twelve) hours as needed (back pain). Remove & Discard patch within 12 hours or as directed by MD      [provider]  midodrine (PROAMATINE) 10 MG tablet Take 10 mg by mouth 3 (three) times daily. Taking on Dialysis days, Tues, Thur, Sat.    [provider]  montelukast (SINGULAIR) 10 MG  tablet Take 10 mg by mouth at bedtime. PM    [provider]  nystatin (MYCOSTATIN/NYSTOP) powder Apply 1 g topically 2 (two) times daily as needed (skin rash).     [provider]  nystatin cream (MYCOSTATIN) Apply 1 application topically 2 (two) times daily as needed for dry skin.     [provider]  rosuvastatin (CRESTOR) 40 MG tablet Take 40 mg by mouth daily. AM    [provider]  senna-docusate (SENNA PLUS) 8.6-50 MG tablet Take 1 tablet by mouth at bedtime. 07/04/18   Nche, Charlene Brooke, NP  sevelamer (RENAGEL) 800 MG tablet Take 1,600 mg by mouth 3 (three) times daily with meals. 1600 mg --3 times daily--take additional 1-2 if eats snack    [provider]     Critical care time: The patient is critically ill with multiple organ systems failure and requires high complexity decision making for assessment and support, frequent evaluation and titration of therapies, application of advanced monitoring technologies and extensive interpretation of multiple databases.   Critical Care Time devoted to patient care services described in this note is  40 Minutes. This time reflects time of care of this signee. This critical care time does not reflect procedure time, or teaching time or supervisory time of PA/NP/Med student/Med Resident etc but could involve care discussion time.  Jacques Earthly, M.D. [facility] Pulmonary/Critical Care Medicine After hours pager: [phone].

## 2018-09-23 NOTE — ED Notes (Signed)
Pt c/o worsening sob, requested to take two puffs of her own inhaler at bedside. Inhaler provided. Oxygen sats 63% on 6L. PA reported previous chart notes want sats above 60%. Dr.Steinl at bedside

## 2018-09-23 NOTE — Telephone Encounter (Signed)
Joan Mosley is aware.   Joan Mosley mention that the pt is unable to hand any therapy and family agree to this.

## 2018-09-23 NOTE — ED Triage Notes (Signed)
  Patient BIB EMS for weakness and diarrhea that started today.  Patient was just discharged after having pneumonia.  Patient has end stage COPD and is on 5-6L of O2 at home.  Patient states she had 3 episodes of diarrhea today and feels weak.  Patient is non-ambulatory.  Has dialysis on T/TH/Sat, missed Tuesday but did receive dialysis on Thursday.  Patient is on 6L [place] and SPO2 81% on arrival.  A&O x3.

## 2018-09-24 ENCOUNTER — Emergency Department (HOSPITAL_COMMUNITY): Payer: Medicare Other

## 2018-09-24 DIAGNOSIS — N186 End stage renal disease: Secondary | ICD-10-CM | POA: Diagnosis present

## 2018-09-24 DIAGNOSIS — F329 Major depressive disorder, single episode, unspecified: Secondary | ICD-10-CM | POA: Diagnosis present

## 2018-09-24 DIAGNOSIS — J441 Chronic obstructive pulmonary disease with (acute) exacerbation: Secondary | ICD-10-CM | POA: Diagnosis present

## 2018-09-24 DIAGNOSIS — I2781 Cor pulmonale (chronic): Secondary | ICD-10-CM | POA: Diagnosis present

## 2018-09-24 DIAGNOSIS — Z6841 Body Mass Index (BMI) 40.0 and over, adult: Secondary | ICD-10-CM | POA: Diagnosis not present

## 2018-09-24 DIAGNOSIS — E039 Hypothyroidism, unspecified: Secondary | ICD-10-CM | POA: Diagnosis not present

## 2018-09-24 DIAGNOSIS — J9621 Acute and chronic respiratory failure with hypoxia: Secondary | ICD-10-CM | POA: Diagnosis present

## 2018-09-24 DIAGNOSIS — J9811 Atelectasis: Secondary | ICD-10-CM | POA: Diagnosis present

## 2018-09-24 DIAGNOSIS — I110 Hypertensive heart disease with heart failure: Secondary | ICD-10-CM | POA: Diagnosis present

## 2018-09-24 DIAGNOSIS — J9601 Acute respiratory failure with hypoxia: Secondary | ICD-10-CM | POA: Insufficient documentation

## 2018-09-24 DIAGNOSIS — E1165 Type 2 diabetes mellitus with hyperglycemia: Secondary | ICD-10-CM | POA: Diagnosis present

## 2018-09-24 DIAGNOSIS — F419 Anxiety disorder, unspecified: Secondary | ICD-10-CM | POA: Diagnosis present

## 2018-09-24 DIAGNOSIS — Z515 Encounter for palliative care: Secondary | ICD-10-CM | POA: Diagnosis present

## 2018-09-24 DIAGNOSIS — I5033 Acute on chronic diastolic (congestive) heart failure: Secondary | ICD-10-CM | POA: Diagnosis present

## 2018-09-24 DIAGNOSIS — Z20828 Contact with and (suspected) exposure to other viral communicable diseases: Secondary | ICD-10-CM | POA: Diagnosis present

## 2018-09-24 DIAGNOSIS — E785 Hyperlipidemia, unspecified: Secondary | ICD-10-CM | POA: Diagnosis present

## 2018-09-24 DIAGNOSIS — R197 Diarrhea, unspecified: Secondary | ICD-10-CM | POA: Diagnosis present

## 2018-09-24 DIAGNOSIS — J449 Chronic obstructive pulmonary disease, unspecified: Secondary | ICD-10-CM | POA: Diagnosis not present

## 2018-09-24 DIAGNOSIS — Z794 Long term (current) use of insulin: Secondary | ICD-10-CM

## 2018-09-24 DIAGNOSIS — J9622 Acute and chronic respiratory failure with hypercapnia: Secondary | ICD-10-CM | POA: Diagnosis present

## 2018-09-24 DIAGNOSIS — Z992 Dependence on renal dialysis: Secondary | ICD-10-CM | POA: Diagnosis not present

## 2018-09-24 DIAGNOSIS — E1142 Type 2 diabetes mellitus with diabetic polyneuropathy: Secondary | ICD-10-CM | POA: Diagnosis present

## 2018-09-24 DIAGNOSIS — Z9981 Dependence on supplemental oxygen: Secondary | ICD-10-CM | POA: Diagnosis not present

## 2018-09-24 DIAGNOSIS — Z7189 Other specified counseling: Secondary | ICD-10-CM | POA: Diagnosis not present

## 2018-09-24 DIAGNOSIS — E890 Postprocedural hypothyroidism: Secondary | ICD-10-CM | POA: Diagnosis present

## 2018-09-24 DIAGNOSIS — E875 Hyperkalemia: Secondary | ICD-10-CM | POA: Diagnosis present

## 2018-09-24 DIAGNOSIS — D696 Thrombocytopenia, unspecified: Secondary | ICD-10-CM | POA: Diagnosis present

## 2018-09-24 DIAGNOSIS — R945 Abnormal results of liver function studies: Secondary | ICD-10-CM | POA: Diagnosis not present

## 2018-09-24 DIAGNOSIS — Z66 Do not resuscitate: Secondary | ICD-10-CM | POA: Diagnosis present

## 2018-09-24 DIAGNOSIS — R627 Adult failure to thrive: Secondary | ICD-10-CM | POA: Diagnosis not present

## 2018-09-24 LAB — COMPREHENSIVE METABOLIC PANEL
ALT: 182 U/L — ABNORMAL HIGH (ref 0–44)
AST: 235 U/L — ABNORMAL HIGH (ref 15–41)
Albumin: 3 g/dL — ABNORMAL LOW (ref 3.5–5.0)
Alkaline Phosphatase: 250 U/L — ABNORMAL HIGH (ref 38–126)
Anion gap: 16 — ABNORMAL HIGH (ref 5–15)
BUN: 59 mg/dL — ABNORMAL HIGH (ref 8–23)
CO2: 27 mmol/L (ref 22–32)
Calcium: 8.2 mg/dL — ABNORMAL LOW (ref 8.9–10.3)
Chloride: 91 mmol/L — ABNORMAL LOW (ref 98–111)
Creatinine, Ser: 8.24 mg/dL — ABNORMAL HIGH (ref 0.44–1.00)
GFR calc Af Amer: 5 mL/min — ABNORMAL LOW (ref >60)
GFR calc non Af Amer: 4 mL/min — ABNORMAL LOW (ref >60)
Glucose, Bld: 165 mg/dL — ABNORMAL HIGH (ref 70–99)
Potassium: 5.5 mmol/L — ABNORMAL HIGH (ref 3.5–5.1)
Sodium: 134 mmol/L — ABNORMAL LOW (ref 135–145)
Total Bilirubin: 0.9 mg/dL (ref 0.3–1.2)
Total Protein: 6.2 g/dL — ABNORMAL LOW (ref 6.5–8.1)

## 2018-09-24 LAB — CBC
HCT: 25.3 % — ABNORMAL LOW (ref 36.0–46.0)
Hemoglobin: 7.8 g/dL — ABNORMAL LOW (ref 12.0–15.0)
MCH: 30.7 pg (ref 26.0–34.0)
MCHC: 30.8 g/dL (ref 30.0–36.0)
MCV: 99.6 fL (ref 80.0–100.0)
Platelets: 148 10*3/uL — ABNORMAL LOW (ref 150–400)
RBC: 2.54 MIL/uL — ABNORMAL LOW (ref 3.87–5.11)
RDW: 16.6 % — ABNORMAL HIGH (ref 11.5–15.5)
WBC: 11.6 10*3/uL — ABNORMAL HIGH (ref 4.0–10.5)
nRBC: 0 % (ref 0.0–0.2)

## 2018-09-24 LAB — CBG MONITORING, ED: Glucose-Capillary: 173 mg/dL — ABNORMAL HIGH (ref 70–99)

## 2018-09-24 LAB — GLUCOSE, CAPILLARY
Glucose-Capillary: 165 mg/dL — ABNORMAL HIGH (ref 70–99)
Glucose-Capillary: 291 mg/dL — ABNORMAL HIGH (ref 70–99)

## 2018-09-24 LAB — SARS CORONAVIRUS 2 BY RT PCR (HOSPITAL ORDER, PERFORMED IN ~~LOC~~ HOSPITAL LAB): SARS Coronavirus 2: NEGATIVE

## 2018-09-24 MED ORDER — SODIUM CHLORIDE 0.9 % IV SOLN: 100.0000 mL | INTRAVENOUS | Status: DC | PRN

## 2018-09-24 MED ORDER — INSULIN ASPART 100 UNIT/ML ~~LOC~~ SOLN
0.0000 [IU] | Freq: Three times a day (TID) | SUBCUTANEOUS | Status: DC
  Administered 2018-09-24 – 2018-09-25 (×3): 2 [IU] via SUBCUTANEOUS
  Administered 2018-09-25 (×2): 3 [IU] via SUBCUTANEOUS
  Administered 2018-09-26: 1 [IU] via SUBCUTANEOUS
  Administered 2018-09-26: 2 [IU] via SUBCUTANEOUS
  Administered 2018-09-27: 3 [IU] via SUBCUTANEOUS
  Administered 2018-09-27 – 2018-09-29 (×2): 1 [IU] via SUBCUTANEOUS
  Administered 2018-09-29 (×2): 2 [IU] via SUBCUTANEOUS
  Administered 2018-09-30: 1 [IU] via SUBCUTANEOUS
  Administered 2018-10-01: 3 [IU] via SUBCUTANEOUS
  Administered 2018-10-02: 1 [IU] via SUBCUTANEOUS
  Administered 2018-10-02 – 2018-10-04 (×2): 2 [IU] via SUBCUTANEOUS

## 2018-09-24 MED ORDER — IPRATROPIUM BROMIDE 0.02 % IN SOLN
0.5000 mg | Freq: Four times a day (QID) | RESPIRATORY_TRACT | Status: DC
  Administered 2018-09-24 – 2018-09-25 (×5): 0.5 mg via RESPIRATORY_TRACT
  Filled 2018-09-24 (×6): qty 2.5

## 2018-09-24 MED ORDER — ACETAMINOPHEN 650 MG RE SUPP: 650.0000 mg | Freq: Four times a day (QID) | RECTAL | Status: DC | PRN

## 2018-09-24 MED ORDER — METHYLPREDNISOLONE SODIUM SUCC 125 MG IJ SOLR
40.0000 mg | Freq: Two times a day (BID) | INTRAMUSCULAR | Status: AC
  Administered 2018-09-24: 40 mg via INTRAVENOUS
  Filled 2018-09-24: qty 2

## 2018-09-24 MED ORDER — MIDODRINE HCL 5 MG PO TABS
ORAL_TABLET | ORAL | Status: AC
  Administered 2018-09-24: 12:00:00 10 mg via ORAL
  Filled 2018-09-24: qty 10

## 2018-09-24 MED ORDER — ALBUTEROL SULFATE (2.5 MG/3ML) 0.083% IN NEBU
2.5000 mg | INHALATION_SOLUTION | Freq: Four times a day (QID) | RESPIRATORY_TRACT | Status: DC
  Administered 2018-09-24 – 2018-09-25 (×5): 2.5 mg via RESPIRATORY_TRACT
  Filled 2018-09-24 (×6): qty 3

## 2018-09-24 MED ORDER — LEVOTHYROXINE SODIUM 112 MCG PO TABS
112.0000 ug | ORAL_TABLET | Freq: Every day | ORAL | Status: DC
  Administered 2018-09-24 – 2018-10-03 (×9): 112 ug via ORAL
  Filled 2018-09-24 (×9): qty 1

## 2018-09-24 MED ORDER — MIDODRINE HCL 5 MG PO TABS
10.0000 mg | ORAL_TABLET | Freq: Three times a day (TID) | ORAL | Status: DC
  Administered 2018-09-24 – 2018-10-04 (×32): 10 mg via ORAL
  Filled 2018-09-24 (×29): qty 2

## 2018-09-24 MED ORDER — LIDOCAINE HCL (PF) 1 % IJ SOLN: 5.0000 mL | INTRAMUSCULAR | Status: DC | PRN

## 2018-09-24 MED ORDER — LIDOCAINE-PRILOCAINE 2.5-2.5 % EX CREA: 1.0000 "application " | TOPICAL_CREAM | CUTANEOUS | Status: DC | PRN

## 2018-09-24 MED ORDER — ALBUMIN HUMAN 25 % IV SOLN
INTRAVENOUS | Status: AC
  Filled 2018-09-24: qty 100

## 2018-09-24 MED ORDER — HEPARIN SODIUM (PORCINE) 1000 UNIT/ML IJ SOLN
INTRAMUSCULAR | Status: AC
  Filled 2018-09-24: qty 4

## 2018-09-24 MED ORDER — CHLORHEXIDINE GLUCONATE CLOTH 2 % EX PADS
6.0000 | MEDICATED_PAD | Freq: Every day | CUTANEOUS | Status: DC
  Administered 2018-09-24 – 2018-09-28 (×5): 6 via TOPICAL

## 2018-09-24 MED ORDER — INSULIN ASPART 100 UNIT/ML ~~LOC~~ SOLN
0.0000 [IU] | Freq: Every day | SUBCUTANEOUS | Status: DC
  Administered 2018-09-24: 22:00:00 3 [IU] via SUBCUTANEOUS

## 2018-09-24 MED ORDER — HEPARIN SODIUM (PORCINE) 1000 UNIT/ML DIALYSIS: 1000.0000 [IU] | INTRAMUSCULAR | Status: DC | PRN

## 2018-09-24 MED ORDER — SODIUM CHLORIDE 0.9% FLUSH
3.0000 mL | Freq: Two times a day (BID) | INTRAVENOUS | Status: DC
  Administered 2018-09-24 – 2018-10-04 (×20): 3 mL via INTRAVENOUS

## 2018-09-24 MED ORDER — ALBUTEROL SULFATE (2.5 MG/3ML) 0.083% IN NEBU
2.5000 mg | INHALATION_SOLUTION | Freq: Four times a day (QID) | RESPIRATORY_TRACT | Status: DC | PRN
  Administered 2018-09-27: 2.5 mg via RESPIRATORY_TRACT
  Filled 2018-09-24: qty 3

## 2018-09-24 MED ORDER — CALCITRIOL 0.25 MCG PO CAPS
0.2500 ug | ORAL_CAPSULE | Freq: Every day | ORAL | Status: DC
  Administered 2018-09-24 – 2018-10-04 (×11): 0.25 ug via ORAL
  Filled 2018-09-24 (×9): qty 1

## 2018-09-24 MED ORDER — METHYLPREDNISOLONE SODIUM SUCC 125 MG IJ SOLR
80.0000 mg | Freq: Three times a day (TID) | INTRAMUSCULAR | Status: DC
  Administered 2018-09-24: 80 mg via INTRAVENOUS
  Filled 2018-09-24: qty 2

## 2018-09-24 MED ORDER — SEVELAMER CARBONATE 800 MG PO TABS
1600.0000 mg | ORAL_TABLET | Freq: Three times a day (TID) | ORAL | Status: DC
  Administered 2018-09-24 – 2018-10-04 (×25): 1600 mg via ORAL
  Filled 2018-09-24 (×27): qty 2

## 2018-09-24 MED ORDER — CLONAZEPAM 0.5 MG PO TABS
1.0000 mg | ORAL_TABLET | Freq: Two times a day (BID) | ORAL | Status: DC | PRN
  Administered 2018-09-24 – 2018-09-26 (×4): 1 mg via ORAL
  Filled 2018-09-24 (×4): qty 2

## 2018-09-24 MED ORDER — INSULIN DETEMIR 100 UNIT/ML ~~LOC~~ SOLN
15.0000 [IU] | Freq: Every day | SUBCUTANEOUS | Status: DC
  Administered 2018-09-24 – 2018-10-04 (×11): 15 [IU] via SUBCUTANEOUS
  Filled 2018-09-24 (×13): qty 0.15

## 2018-09-24 MED ORDER — SODIUM CHLORIDE 0.9% FLUSH
3.0000 mL | INTRAVENOUS | Status: DC | PRN
  Administered 2018-10-03: 21:00:00 3 mL via INTRAVENOUS
  Filled 2018-09-24: qty 3

## 2018-09-24 MED ORDER — SODIUM POLYSTYRENE SULFONATE 15 GM/60ML PO SUSP
15.0000 g | Freq: Once | ORAL | Status: AC
  Administered 2018-09-24: 06:00:00 15 g via ORAL
  Filled 2018-09-24: qty 60

## 2018-09-24 MED ORDER — ALTEPLASE 2 MG IJ SOLR: 2.0000 mg | Freq: Once | INTRAMUSCULAR | Status: DC | PRN

## 2018-09-24 MED ORDER — ROSUVASTATIN CALCIUM 20 MG PO TABS
40.0000 mg | ORAL_TABLET | Freq: Every day | ORAL | Status: DC
  Administered 2018-09-24 – 2018-10-04 (×11): 40 mg via ORAL
  Filled 2018-09-24 (×11): qty 2

## 2018-09-24 MED ORDER — FLUOXETINE HCL 10 MG PO CAPS
10.0000 mg | ORAL_CAPSULE | Freq: Every day | ORAL | Status: DC
  Administered 2018-09-24 – 2018-10-04 (×11): 10 mg via ORAL
  Filled 2018-09-24 (×11): qty 1

## 2018-09-24 MED ORDER — ACETAMINOPHEN 325 MG PO TABS
650.0000 mg | ORAL_TABLET | Freq: Four times a day (QID) | ORAL | Status: DC | PRN
  Administered 2018-09-28 – 2018-10-04 (×4): 650 mg via ORAL
  Filled 2018-09-24 (×4): qty 2

## 2018-09-24 MED ORDER — PENTAFLUOROPROP-TETRAFLUOROETH EX AERO: 1.0000 "application " | INHALATION_SPRAY | CUTANEOUS | Status: DC | PRN

## 2018-09-24 MED ORDER — ARFORMOTEROL TARTRATE 15 MCG/2ML IN NEBU
15.0000 ug | INHALATION_SOLUTION | Freq: Two times a day (BID) | RESPIRATORY_TRACT | Status: DC
  Administered 2018-09-24 – 2018-10-03 (×17): 15 ug via RESPIRATORY_TRACT
  Filled 2018-09-24 (×20): qty 2

## 2018-09-24 MED ORDER — SODIUM CHLORIDE 0.9 % IV SOLN: 250.0000 mL | INTRAVENOUS | Status: DC | PRN

## 2018-09-24 MED ORDER — MONTELUKAST SODIUM 10 MG PO TABS
10.0000 mg | ORAL_TABLET | Freq: Every day | ORAL | Status: DC
  Administered 2018-09-24 – 2018-10-03 (×10): 10 mg via ORAL
  Filled 2018-09-24 (×10): qty 1

## 2018-09-24 MED ORDER — BUDESONIDE 0.25 MG/2ML IN SUSP
0.2500 mg | Freq: Two times a day (BID) | RESPIRATORY_TRACT | Status: DC
  Administered 2018-09-24 – 2018-10-03 (×17): 0.25 mg via RESPIRATORY_TRACT
  Filled 2018-09-24 (×20): qty 2

## 2018-09-24 MED ORDER — SODIUM CHLORIDE 0.9 % IV SOLN
500.0000 mg | Freq: Every day | INTRAVENOUS | Status: DC
  Administered 2018-09-24: 500 mg via INTRAVENOUS
  Filled 2018-09-24: qty 500

## 2018-09-24 NOTE — ED Provider Notes (Signed)
[place] EMERGENCY DEPARTMENT Provider Note   CSN: [id number] Arrival date & time: 09/23/18  2232     History   Chief Complaint Chief Complaint  Patient presents with  . Weakness  . Diarrhea    HPI Joan Mosley is a 73 y.o. female with history of advanced COPD, chronic hypoxic respiratory failure at baseline 6 L [place], ESRD on HD TTS, morbid obesity, recent hospitalization for SDH on 8/13-8/21/2020 presents to the ER for evaluation of weakness and diarrhea.  Reports 4-5 episodes of diarrhea today.  She does not know if there is any blood or melena in it.  She cannot describe it because she has not looked at it.  Describes general weakness as whole body hurting and having no energy.  She denies any fevers, nausea, vomiting, abdominal pain.  She produces minimal urine and denies any changes to this or dysuria.  She denies any chest pain, cough.  Reports chronic shortness of breath but states this is at her baseline.  She has continued to use 5 to 6 L [place] at home without any changes.  She is wheelchair-bound.  MOST care form at bedside. She lives with son and daughter. Last HD yesterday.      HPI  Past Medical History:  Diagnosis Date  . Arthritis   . COPD (chronic obstructive pulmonary disease) ([place])   . Diabetes mellitus without complication ([place])   . Diabetic neuropathy ([place])   . 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 Renal disorder    ESRD  . Thyroid disease   . TIA (transient ischemic attack)     Patient Active Problem List   Diagnosis Date Noted  . Advanced care planning/counseling discussion   . Goals of care, counseling/discussion   . Palliative care by specialist   . Acute on chronic respiratory failure with hypoxia ([place]) 09/15/2018  . Pulmonary edema 09/08/2018  . Respiratory failure ([place]) 09/08/2018  . Weakness   . SDH (subdural hematoma) ([place]) 09/06/2018  . Chronic respiratory failure with hypoxia, on home O2 therapy ([place]) 09/06/2018  .  Subdural hematoma ([place]) 08/22/2018  . Elevated troponin 08/22/2018  . ESRD (end stage renal disease) ([place]) 08/22/2018  . Anxiety 07/04/2018  . Counseling regarding advanced directives and goals of care 07/01/2018  . Full code status 07/01/2018  . Drug-induced constipation 07/01/2018  . Hemodialysis-associated hypotension 07/01/2018  . Dialysis patient ([place]) 07/01/2018  . HOH (hard of hearing) 07/01/2018  . Stage 5 chronic kidney disease on chronic dialysis ([place]) 07/01/2018  . Type 2 diabetes mellitus with diabetic polyneuropathy, with long-term current use of insulin ([place]) 07/01/2018  . Hypothyroidism 07/01/2018  . Gait instability 07/01/2018  . Hyperkalemia 04/20/2018    Past Surgical History:  Procedure Laterality Date  . ABDOMINAL HYSTERECTOMY    . RECTOCELE REPAIR    . REPLACEMENT TOTAL KNEE BILATERAL Bilateral 2008  . THYROIDECTOMY     80%  . VAGINAL WOUND CLOSURE / REPAIR       OB History   No obstetric history on file.      Home Medications    Prior to Admission medications   Medication Sig Start Date End Date Taking? Authorizing Provider  acetaminophen (TYLENOL) 500 MG tablet Take 1,000 mg by mouth every 6 (six) hours as needed for mild pain.    [provider]  albuterol (PROVENTIL) (2.5 MG/3ML) 0.083% nebulizer solution Take 3 mLs (2.5 mg total) by nebulization 4 (four) times daily. 09/16/18   Nettey,  Evalee Jefferson, MD  albuterol (VENTOLIN HFA) 108 (90 Base) MCG/ACT inhaler Inhale 2 puffs into the lungs every 6 (six) hours as needed for wheezing or shortness of breath.    [provider]  calcitRIOL (ROCALTROL) 0.25 MCG capsule Take 0.25 mcg by mouth daily.    [provider]  clonazePAM (KLONOPIN) 1 MG tablet Take 1 tablet (1 mg total) by mouth 2 (two) times daily as needed for anxiety. Change in dose 08/08/18   Nche, Charlene Brooke, NP  FLUoxetine (PROZAC) 10 MG tablet Take 1 tablet (10 mg total) by mouth daily. 08/08/18   Nche, Charlene Brooke, NP   furosemide (LASIX) 80 MG tablet Take 80 mg by mouth See admin instructions. Sunday, Monday,Wed,Friday--in AM     [provider]  insulin detemir (LEVEMIR) 100 UNIT/ML injection Inject 0.15 mLs (15 Units total) into the skin daily. 09/16/18   Mariel Aloe, MD  insulin lispro (HUMALOG) 100 UNIT/ML injection Inject 1-5 Units into the skin 3 (three) times daily with meals as needed for high blood sugar. Sliding scale     [provider]  ipratropium (ATROVENT) 0.02 % nebulizer solution Inhale 0.5 mg into the lungs 4 (four) times daily.     [provider]  levothyroxine (SYNTHROID) 112 MCG tablet Take 1 tablet (112 mcg total) by mouth daily before breakfast. 1 AM Patient taking differently: Take 112 mcg by mouth daily before breakfast.  07/04/18   Nche, Charlene Brooke, NP  lidocaine (LIDODERM) 5 % Place 1 patch onto the skin every 12 (twelve) hours as needed (back pain). Remove & Discard patch within 12 hours or as directed by MD     [provider]  midodrine (PROAMATINE) 10 MG tablet Take 10 mg by mouth 3 (three) times daily. Taking on Dialysis days, Tues, Thur, Sat.    [provider]  montelukast (SINGULAIR) 10 MG tablet Take 10 mg by mouth at bedtime. PM    [provider]  nystatin (MYCOSTATIN/NYSTOP) powder Apply 1 g topically 2 (two) times daily as needed (skin rash).     [provider]  nystatin cream (MYCOSTATIN) Apply 1 application topically 2 (two) times daily as needed for dry skin.     [provider]  rosuvastatin (CRESTOR) 40 MG tablet Take 40 mg by mouth daily. AM    [provider]  senna-docusate (SENNA PLUS) 8.6-50 MG tablet Take 1 tablet by mouth at bedtime. 07/04/18   Nche, Charlene Brooke, NP  sevelamer (RENAGEL) 800 MG tablet Take 1,600 mg by mouth 3 (three) times daily with meals. 1600 mg --3 times daily--take additional 1-2 if eats snack    [provider]    Family History Family History   Problem Relation Age of Onset  . Heart disease Mother   . Hypertension Mother   . Heart disease Father   . Hypertension Father     Social History Social History   Tobacco Use  . Smoking status: Former Smoker    Packs/day: 1.00    Years: 35.00    Pack years: 35.00    Quit date: 03/29/1981    Years since quitting: 37.5  . Smokeless tobacco: Never Used  Substance Use Topics  . Alcohol use: Never    Frequency: Never  . Drug use: Never     Allergies   Avelox [moxifloxacin hcl in nacl], Codeine, Other, Penicillins, Sulfa antibiotics, and Shellfish allergy   Review of Systems Review of Systems  Constitutional: Positive for fatigue.  Respiratory: Positive for shortness of breath (chronic).   Gastrointestinal: Positive for diarrhea.  Neurological: Positive for weakness (generalized).  All other systems reviewed and are negative.    Physical Exam Updated Vital Signs BP 140/85 (BP Location: Right Wrist)   Pulse (!) 101   Temp 99 F (37.2 C) (Oral)   Resp (!) 36   Ht 5\' 1"  (1.549 m)   Wt 108.9 kg   SpO2 (!) 85%   BMI 45.35 kg/m   Physical Exam Vitals signs and nursing note reviewed.  Constitutional:      Appearance: She is well-developed.     Comments: Chronically ill appearing. Obese   HENT:     Head: Normocephalic and atraumatic.     Nose: Nose normal.  Eyes:     Conjunctiva/sclera: Conjunctivae normal.  Neck:     Musculoskeletal: Normal range of motion.  Cardiovascular:     Rate and Rhythm: Normal rate and regular rhythm.  Pulmonary:     Effort: Respiratory distress present.     Comments: tachypnic in the high 20s. SpO2 79% on 6 L [place]. Diminished lung sounds to mid/lower lobes. No crackles or wheezing  Abdominal:     General: Bowel sounds are normal.     Palpations: Abdomen is soft.     Tenderness: There is no abdominal tenderness.     Comments: Obese abdomen. Non tender.   Musculoskeletal: Normal range of motion.  Skin:    General: Skin is warm and  dry.     Capillary Refill: Capillary refill takes less than 2 seconds.  Neurological:     Mental Status: She is alert.  Psychiatric:        Behavior: Behavior normal.      ED Treatments / Results  Labs (all labs ordered are listed, but only abnormal results are displayed) Labs Reviewed  CBC WITH DIFFERENTIAL/PLATELET - Abnormal; Notable for the following components:      Result Value   RBC 2.88 (*)    Hemoglobin 8.6 (*)    HCT 28.9 (*)    MCV 100.3 (*)    MCHC 29.8 (*)    RDW 16.7 (*)    Platelets 143 (*)    All other components within normal limits  BASIC METABOLIC PANEL - Abnormal; Notable for the following components:   Potassium 5.4 (*)    Chloride 90 (*)    Glucose, Bld 207 (*)    BUN 54 (*)    Creatinine, Ser 7.87 (*)    Calcium 8.0 (*)    GFR calc non Af Amer 5 (*)    GFR calc Af Amer 5 (*)    Anion gap 20 (*)    All other components within normal limits  MAGNESIUM - Abnormal; Notable for the following components:   Magnesium 2.6 (*)    All other components within normal limits  C DIFFICILE QUICK SCREEN W PCR REFLEX  GASTROINTESTINAL PANEL BY PCR, STOOL (REPLACES STOOL CULTURE)  SARS CORONAVIRUS 2 (HOSPITAL ORDER, [facility])    EKG EKG Interpretation  Date/Time:  Saturday September 24 2018 00:17:10 EDT Ventricular Rate:  99 PR Interval:    QRS Duration: 98 QT Interval:  358 QTC Calculation: 460 R Axis:   41 Text Interpretation:  indeterminate, likely sinus Abnormal R-wave progression, early transition Borderline T abnormalities, anterior leads Confirmed by Ripley Fraise [phone]) on 09/24/2018 12:19:58 AM   Radiology No results found.  Procedures .Critical Care Performed by: Kinnie Feil, PA-C Authorized  by: Kinnie Feil, PA-C   Critical care provider statement:    Critical care time (minutes):  45   Critical care was necessary to treat or prevent imminent or life-threatening deterioration of the following  conditions:  Renal failure and respiratory failure (admit to SDU)   Critical care was time spent personally by me on the following activities:  Discussions with consultants, evaluation of patient's response to treatment, examination of patient, ordering and performing treatments and interventions, ordering and review of laboratory studies, ordering and review of radiographic studies, pulse oximetry, re-evaluation of patient's condition, obtaining history from patient or surrogate, review of old charts and development of treatment plan with patient or surrogate   I assumed direction of critical care for this patient from another provider in my specialty: no     (including critical care time)  Medications Ordered in ED Medications - No data to display   Initial Impression / Assessment and Plan / ED Course  I have reviewed the triage vital signs and the nursing notes.  Pertinent labs & imaging results that were available during my care of the patient were reviewed by me and considered in my medical decision making (see chart for details).  Clinical Course as of Sep 24 42  Fri Sep 23, 2018  2318 Spoke to ICU attending states based on chart review ICU team unlikely to provide much assistance. He will have his team come see pt as consult. Appreciate his input   [CG]  Sat Sep 24, 2018  0006 Hemoglobin(!): 8.6 [CG]  0006 Creatinine(!): 7.87 [CG]  0006 GFR, Est Non African American(!): 5 [CG]  0006 Potassium(!): 5.4 [CG]  0006 Anion gap(!): 20 [CG]  0006 Magnesium(!): 2.6 [CG]  0021 indeterminate, likely sinus Abnormal R-wave progression, early transition Borderline T abnormalities, anterior leads Confirmed by Ripley Fraise [phone]) on 09/24/2018 12:19:58 AM  EKG 12-Lead [CG]  0023 PCCM has evaluated pt. They recommend SDU. SpO2 goal 88% while in hospital. She is not on 12 L HFNC    [CG]    Clinical Course User Index [CG] Kinnie Feil, PA-C   I have reviewed patient's EMR to  obtain pertinent pmh.   73 yo F with advanced COPD, chronic respiratory failure on 6L [place], ESRD on HD here for weakness, diarrhea.   Initially denied CP, SOB or respiratory changes to me but while in ER reported increased SOB.   Arrives tachycardic, tachypnic with SpO2 in low 80s on baseline 6 L.  HD stable. Clinically appears to be in mild respiratory distress, but not critically ill or toxic. Oral temp 18F, will recheck rectal temp.   Ddx includes viral diarrhea vs bacterial diarrhea including c diff given recent hospitalization.  Her SOB likely multifactorial from COPD, central obesity. She denies infectious respiratory symptoms such as fever, cough, CP but recent hospitalization puts her at risk for COVID.  She last went to HD yesterday.    ER work up reviewed by me remarkable for baseline anemia. Creatinine/BUN, K, MG, AG slightly elevated from baseline likely reflective of off day from dialysis. EKG without ischemic changes or hyperkalemic changes. No leukocytosis. She is afebrile technically oral temp 99 F. Will check rectal temp.  Low grade fever could be causing tachypnea and increased respiratory effort.     Chart review shows PCCM notes evaluated patient at last admission. She is DNR/DNI. Per PCCM notes goal sat >60% and titrate as needed based on symptoms.  Had early discussion with PCCM regarding previous recommendations  and sat goals.    PCCM evaluated pt in ER, recommended SpO2 goal >88%, SDU admission. She is now on 12 L humidified HFNC with SpO2 >90%.   0030: Pt re-evaluated. Feels better now while on 12 L and albuterol inhaler. Spoke to children at home.  Recommended admission which she is in agreement to, "I want to get better".   Will discuss with medicine team for admission for acute on chronic hypoxic respiratory, need dialysis tomorrow. COVID, c.diff and GI stool panel, rectal temp, CXR pending.   0045: Spoke to Dr Maudie Mercury who has accepted patient.  Final Clinical  Impressions(s) / ED Diagnoses   Final diagnoses:  Acute on chronic respiratory failure with hypoxia ([place])  Diarrhea of presumed infectious origin    ED Discharge Orders    None       Kinnie Feil, PA-C 09/24/18 0044    Lajean Saver, MD 09/24/18 1451

## 2018-09-24 NOTE — ED Notes (Signed)
Admitting at bedside 

## 2018-09-24 NOTE — Progress Notes (Addendum)
PROGRESS NOTE   Joan Mosley  [id number]    DOB: 03/23/1945    DOA: 09/23/2018  PCP: Flossie Buffy, NP   I have briefly reviewed patients previous medical records in [facility].  Chief Complaint  Patient presents with   Weakness   Diarrhea    Brief Narrative:  73 year old female, lives with her family, nonambulatory/electric wheelchair mobile, PMH of end-stage COPD, chronic respiratory failure on home oxygen 5 L/min, ESRD on TTS HD, morbid obesity, DM 2 with renal complications, hypothyroid, HLD, hypotension, anxiety and depression, recent hospitalization 8/11-8/12 for subdural hematoma following fall, managed conservatively, declined SNF, 4 hospital admissions in the last 6 months, presented to [facility] ED via EMS on 8/28 due to generalized weakness, diarrhea and dyspnea.  She was admitted for acute on chronic hypoxic respiratory failure, advanced COPD, less likely an exacerbation, possible volume overload/pulmonary edema, diarrhea and hyperkalemia.  Seen by PCCM in ED.  Nephrology consulted for HD.   Assessment & Plan:   Principal Problem:   Acute respiratory failure with hypoxia (HCC) Active Problems:   Type 2 diabetes mellitus with diabetic polyneuropathy, with long-term current use of insulin (HCC)   Hypothyroidism   ESRD (end stage renal disease) (HCC)   Acute on chronic respiratory failure with hypoxia/end-stage COPD/suspected OSA  Suspect due to pulmonary edema complicating end-stage COPD and less likely due to COPD exacerbation.  Volume management across HD.  Give a dose of reduced dose of IV Solu-Medrol this evening and then discontinue steroids.  Titrate oxygen to maintain saturations between 88-92%.  Able to come down from 12 L to 10 L HF [place] this morning prior to HD.  Can try BiPAP nightly and PRN.  Brovana, Pulmicort and bronchodilator nebulizations.  Continue Singulair.  Diarrhea  Appears to have resolved.  Patient reports that she has not had any  diarrhea in 2 days.  DC C. difficile order and contact isolation.  Re-assess if has diarrhea again.  ESRD on TTS HD/volume overload   Reportedly missed her dialysis on Tuesday.  Had dialysis on Thursday.  Appears volume overloaded.  Nephrology consulted and undergoing HD this morning.  Hyperkalemia  HD.  Acute on chronic diastolic CHF  TTE 99991111: LVEF 60-65%.  Volume management across HD.  Type II DM with hyperglycemia and renal complications.  Reduce and stop steroids as noted above.  Continue Levemir 15 units daily and SSI.  Monitor CBGs closely and adjust insulins as needed.  Hypothyroid Continue levothyroxine.  Hyperlipidemia Continue Crestor.  Anxiety and depression Continue fluoxetine and as needed clonazepam.  History of recent subdural hematoma Managed conservatively. No focal deficits.  Morbid obesity/Body mass index is 46.36 kg/m.  Anemia in ESRD  No bleeding reported.  At baseline hemoglobin appears to run in the 8 g range.  Hemoglobin today 7.8.  Follow CBC in a.m. and transfuse if hemoglobin 7 g or less.  Thrombocytopenia, mild  Follow CBC.  Abnormal LFTs  Suspect due to passive hepatic congestion.  No GI symptoms.  Follow CMP.  Adult failure to thrive Secondary to advanced age and multiple severe significant and many irreversible comorbidities. Ongoing frequent rehospitalization's. I discussed in detail with patient's son Mr. Dayna Teply who understands and agrees with her overall poor prognosis and is very interested in discussing with PMT regarding goals of care, he is also interested in considering hospice.  He plans to speak with his mother later when she returns from HD.  PMT consult placed.  DVT prophylaxis: SCD Code Status:  DNR Family Communication: Discussed in detail with patient's son, updated care and answered questions. Disposition: DC home pending clinical improvement.  May need serial HD's given volume  overload   Consultants:  PCCM Nephrology  Procedures:  HD  Antimicrobials:  Azithromycin   Subjective: Patient interviewed and examined this morning with RN at bedside prior to going for HD.  Denies dyspnea or chest pain.  Indicates that she has some congestive cough but unable to bring up sputum.  No fevers.  States that she is on home oxygen 5 L/min.  Indicates that she stays at home with her son, daughter and grandson, mostly in bed and moves around with the help of a electric chair.  States that she has not had any diarrhea since 2 days.  No abdominal pain.  Objective:  Vitals:   09/24/18 0612 09/24/18 0800 09/24/18 1125 09/24/18 1136  BP: (!) 144/97 (!) 145/99 (!) 142/97 139/73  Pulse:  (!) 103 (!) 103 91  Resp: (!) 22 20 (!) 24 (!) 24  Temp: 98 F (36.7 C) 98 F (36.7 C) 98.9 F (37.2 C)   TempSrc: Oral Oral Oral   SpO2:  98% 90%   Weight: 110 kg  111.3 kg   Height: 5\' 1"  (1.549 m)       Examination:  General exam: Elderly female, moderately built and morbidly obese sitting up in bed with no obvious distress. Respiratory system: Clear to auscultation anteriorly.  Diminished breath sounds in the bases with few basal crackles.  Occasional rhonchi posteriorly.  No increased work of breathing.   Cardiovascular system: S1 & S2 heard, RRR. No JVD, murmurs, rubs, gallops or clicks.  1+ pitting bilateral leg edema.  SCDs have left indentation on her legs. Gastrointestinal system: Abdomen is nondistended, soft and nontender. No organomegaly or masses felt. Normal bowel sounds heard. Central nervous system: Alert and oriented x2. No focal neurological deficits. Extremities: Symmetric 5 x 5 power. Skin: No rashes, lesions or ulcers Psychiatry: Judgement and insight appear impaired. Mood & affect appropriate.     Data Reviewed: I have personally reviewed following labs and imaging studies  CBC: Recent Labs  Lab 09/23/18 2320 09/24/18 0429  WBC 8.0 11.6*  NEUTROABS 6.1   --   HGB 8.6* 7.8*  HCT 28.9* 25.3*  MCV 100.3* 99.6  PLT 143* [id number]*   Basic Metabolic Panel: Recent Labs  Lab 09/23/18 2320 09/24/18 0429  NA 135 134*  K 5.4* 5.5*  CL 90* 91*  CO2 25 27  GLUCOSE 207* 165*  BUN 54* 59*  CREATININE 7.87* 8.24*  CALCIUM 8.0* 8.2*  MG 2.6*  --    Liver Function Tests: Recent Labs  Lab 09/24/18 0429  AST 235*  ALT 182*  ALKPHOS 250*  BILITOT 0.9  PROT 6.2*  ALBUMIN 3.0*    Cardiac Enzymes: No results for input(s): CKTOTAL, CKMB, CKMBINDEX, TROPONINI in the last 168 hours.  CBG: Recent Labs  Lab 09/24/18 0105 09/24/18 0730  GLUCAP 173* 165*    Recent Results (from the past 240 hour(s))  SARS Coronavirus 2 [facility] order, Performed in [facility] hospital lab) Nasopharyngeal Nasopharyngeal Swab     Status: None   Collection Time: 09/24/18  1:08 AM   Specimen: Nasopharyngeal Swab  Result Value Ref Range Status   SARS Coronavirus 2 NEGATIVE NEGATIVE Final    Comment: (NOTE) If result is NEGATIVE SARS-CoV-2 target nucleic acids are NOT DETECTED. The SARS-CoV-2 RNA is generally detectable in upper and lower  respiratory specimens  during the acute phase of infection. The lowest  concentration of SARS-CoV-2 viral copies this assay can detect is 250  copies / mL. A negative result does not preclude SARS-CoV-2 infection  and should not be used as the sole basis for treatment or other  patient management decisions.  A negative result may occur with  improper specimen collection / handling, submission of specimen other  than nasopharyngeal swab, presence of viral mutation(s) within the  areas targeted by this assay, and inadequate number of viral copies  (<250 copies / mL). A negative result must be combined with clinical  observations, patient history, and epidemiological information. If result is POSITIVE SARS-CoV-2 target nucleic acids are DETECTED. The SARS-CoV-2 RNA is generally detectable in upper and lower  respiratory  specimens dur ing the acute phase of infection.  Positive  results are indicative of active infection with SARS-CoV-2.  Clinical  correlation with patient history and other diagnostic information is  necessary to determine patient infection status.  Positive results do  not rule out bacterial infection or co-infection with other viruses. If result is PRESUMPTIVE POSTIVE SARS-CoV-2 nucleic acids MAY BE PRESENT.   A presumptive positive result was obtained on the submitted specimen  and confirmed on repeat testing.  While 2019 novel coronavirus  (SARS-CoV-2) nucleic acids may be present in the submitted sample  additional confirmatory testing may be necessary for epidemiological  and / or clinical management purposes  to differentiate between  SARS-CoV-2 and other Sarbecovirus currently known to infect humans.  If clinically indicated additional testing with an alternate test  methodology [phone]) is advised. The SARS-CoV-2 RNA is generally  detectable in upper and lower respiratory sp ecimens during the acute  phase of infection. The expected result is Negative. Fact Sheet for Patients:  StrictlyIdeas.no Fact Sheet for Healthcare Providers: BankingDealers.co.za This test is not yet approved or cleared by the [place] FDA and has been authorized for detection and/or diagnosis of SARS-CoV-2 by FDA under an Emergency Use Authorization (EUA).  This EUA will remain in effect (meaning this test can be used) for the duration of the COVID-19 declaration under Section 564(b)(1) of the Act, 21 U.S.C. section 360bbb-3(b)(1), unless the authorization is terminated or revoked sooner. Performed at [facility], [place] [address] [place], [place] [address]          Radiology Studies: Dg Chest Portable 1 View  Result Date: 09/24/2018 CLINICAL DATA:  Shortness of breath EXAM: PORTABLE CHEST 1 VIEW COMPARISON:  09/15/2018, 09/11/2018,  09/09/2018, 05/18/2018 FINDINGS: Right-sided central venous catheter with tips over the SVC and proximal right atrium. Stable enlargement of the cardio mediastinum. Diffuse bilateral fine nodular and slightly reticular pattern, possibly due to chronic interstitial lung disease. Superimposed streaky basilar atelectasis. No pneumothorax. Aortic atherosclerosis. IMPRESSION: 1. Stable cardiomegaly with streaky basilar atelectasis 2. Similar appearance of diffuse bilateral fine reticular interstitial opacity, suspected to be due to chronic interstitial disease. Electronically Signed   By: Donavan Foil M.D.   On: 09/24/2018 00:59        Scheduled Meds:  albuterol  2.5 mg Nebulization QID   calcitRIOL  0.25 mcg Oral Daily   Chlorhexidine Gluconate Cloth  6 each Topical Q0600   FLUoxetine  10 mg Oral Daily   insulin aspart  0-5 Units Subcutaneous QHS   insulin aspart  0-9 Units Subcutaneous TID WC   insulin detemir  15 Units Subcutaneous Daily   ipratropium  0.5 mg Inhalation QID   levothyroxine  112 mcg  Oral QAC breakfast   methylPREDNISolone (SOLU-MEDROL) injection  80 mg Intravenous Q8H   midodrine  10 mg Oral TID WC   montelukast  10 mg Oral QHS   rosuvastatin  40 mg Oral Daily   sevelamer carbonate  1,600 mg Oral TID WC   sodium chloride flush  3 mL Intravenous Q12H   Continuous Infusions:  albumin human     sodium chloride     sodium chloride     sodium chloride     azithromycin Stopped (09/24/18 0651)     LOS: 0 days     Vernell Leep, MD, FACP, [facility]. [facility]  To contact the attending provider between 7A-7P or the covering provider during after hours 7P-7A, please log into the web site [email or web address] and access using universal [place] password for that web site. If you do not have the password, please call the hospital operator.  09/24/2018, 11:59 AM

## 2018-09-24 NOTE — Progress Notes (Signed)
Pt received from HD, got 4L fluid out according to HD Nurse. Diet ordered and prescribed meds carried out. Oxygen tried to wean to 8L but SPO2 was in 80's so bumped upto 10L again and SPO2 is maintained between 91-95, will continue to monitor  Palma Holter, RN

## 2018-09-24 NOTE — Progress Notes (Addendum)
Oxygen HFNC dropped to 10 from 12 L and oxygen sat is 86%  Still @ 10 it came back to 91 but when dropped to 8L it dropped to 86% so maintained at 10L this time  No SOB, denies CP and distress  Will continue to monitor  [facility] RN

## 2018-09-24 NOTE — Progress Notes (Signed)
Pt left for HD at 11am

## 2018-09-24 NOTE — Progress Notes (Signed)
Admit: 09/23/2018 LOS: 0  71F ESRD TTS recent admission for weakness/falls hx/o SDH, FTT rec SNF but decided to go home, readmit with SOB, diarrhea, inc hypoxia  Subjective:  . He presented with shortness of breath and diarrhea, hypoxic on 6 L nasal cannula . Known COPD, given bronchodilators, placed on steroids, azithromycin. Marland Kitchen COVID negative . Labs today, K5.5, BUN 59, bicarb 27, hemoglobin 7.8 . No fevers.  Blood pressure stable.  On high flow nasal cannula.   08/28 0701 - 08/29 0700 In: 522.9 [P.O.:240; IV Piggyback:282.9] Out: -   Filed Weights   09/23/18 2242 09/24/18 0612  Weight: 108.9 kg 110 kg    Scheduled Meds: . albuterol  2.5 mg Nebulization QID  . calcitRIOL  0.25 mcg Oral Daily  . FLUoxetine  10 mg Oral Daily  . insulin aspart  0-5 Units Subcutaneous QHS  . insulin aspart  0-9 Units Subcutaneous TID WC  . insulin detemir  15 Units Subcutaneous Daily  . ipratropium  0.5 mg Inhalation QID  . levothyroxine  112 mcg Oral QAC breakfast  . methylPREDNISolone (SOLU-MEDROL) injection  80 mg Intravenous Q8H  . midodrine  10 mg Oral TID WC  . montelukast  10 mg Oral QHS  . rosuvastatin  40 mg Oral Daily  . sevelamer carbonate  1,600 mg Oral TID WC  . sodium chloride flush  3 mL Intravenous Q12H   Continuous Infusions: . sodium chloride    . azithromycin Stopped (09/24/18 0651)   PRN Meds:.sodium chloride, acetaminophen **OR** acetaminophen, albuterol, clonazePAM, sodium chloride flush  Current Labs: reviewed   [facility] 4h 400/1.5 105.5kg 2/2 bath Hep none TDC Calcitriol 1.5 mcg PO qHD Sensipar 60 mg PO qHD renvela 800mg  3 tabs AC Midodrine 10 mg PO TIW before HD and mid treatment for SBP <95  Physical Exam:  Blood pressure (!) 145/99, pulse (!) 103, temperature 98 F (36.7 C), temperature source Oral, resp. rate (!) 22, height 5\' 1"  (1.549 m), weight 110 kg, SpO2 98 %. Chronically ill appearing Inc RR/WOB Reagular, mild tachycardia, nl s1s2  no murmur rub S/nt 2+ Edema  A 1. ESRD [facility] [place] 2. Hypoxia / COPD / ?PNA, COVID neg, inc O2 req 3. Diarrhea, per TRH 4. FTT 5. Recent SDH 6. DM2 7. MDD/SDH  P . HD today, 4L UF, Albumin/Midodrine for BP support. No heparin 2/2 #5.  2K bath, TDC . needs SNF if agreeable . Medication Issues; o Preferred narcotic agents for pain control are hydromorphone, fentanyl, and methadone. Morphine should not be used.  o Baclofen should be avoided o Avoid oral sodium phosphate and magnesium citrate based laxatives / bowel preps    Pearson Grippe MD 09/24/2018, 9:03 AM  Recent Labs  Lab 09/23/18 2320 09/24/18 0429  NA 135 134*  K 5.4* 5.5*  CL 90* 91*  CO2 25 27  GLUCOSE 207* 165*  BUN 54* 59*  CREATININE 7.87* 8.24*  CALCIUM 8.0* 8.2*   Recent Labs  Lab 09/23/18 2320 09/24/18 0429  WBC 8.0 11.6*  NEUTROABS 6.1  --   HGB 8.6* 7.8*  HCT 28.9* 25.3*  MCV 100.3* 99.6  PLT 143* 148*

## 2018-09-24 NOTE — Plan of Care (Signed)

## 2018-09-24 NOTE — H&P (Addendum)
TRH H&P    Patient Demographics:    Joan Mosley, is a 73 y.o. female  MRN: [id number]  DOB - 1945-05-07  Admit Date - 09/23/2018  Referring MD/NP/PA:   Carmon Sails  Outpatient Primary MD for the patient is Nche, Charlene Brooke, NP  Patient coming from:  home  Chief complaint-  dyspnea   HPI:    Joan Mosley  is a 73 y.o. female,  w Dm2, Neuropathy, ESRD on HD (T, T, S), Copd on baseline 6L o2 [place], w recent admission for subdural hematoma apparently presents with c/o dyspnea and diarrhea.  Pt denies fever, chills, cp, palp, n/v, abd pain,  brbpr, black stool.   In ED,  T 99.0 P 101, R 36, Bp 140/85  Pox 84% on 6L    CXR IMPRESSION: 1. Stable cardiomegaly with streaky basilar atelectasis 2. Similar appearance of diffuse bilateral fine reticular interstitial opacity, suspected to be due to chronic interstitial Disease.  Wbc 8.0, Hgb 8.6, Plt 143  Na 135, K 5.4, Bun 54, Creatinine 7.87   Magnesium 2.6  Pt will be admitted for acute on chronic respiratory failure with hypoxia, and diarrhea.    Review of systems:    In addition to the HPI above,  No Fever-chills, No Headache, No changes with Vision or hearing, No problems swallowing food or Liquids, No Chest pain, No Cough  No Abdominal pain, No Nausea or Vomiting, No Blood in stool or Urine, No dysuria, No new skin rashes or bruises, No new joints pains-aches,  No new weakness, tingling, numbness in any extremity, No recent weight gain or loss, No polyuria, polydypsia or polyphagia, No significant Mental Stressors.  All other systems reviewed and are negative.    Past History of the following :    Past Medical History:  Diagnosis Date  . Arthritis   . COPD (chronic obstructive pulmonary disease) ([place])   . Diabetes mellitus without complication ([place])   . Diabetic neuropathy ([place])   . Oxygen dependent 03/30/2018   5L  home O2  . Renal disorder    ESRD  . Thyroid disease   . TIA (transient ischemic attack)       Past Surgical History:  Procedure Laterality Date  . ABDOMINAL HYSTERECTOMY    . RECTOCELE REPAIR    . REPLACEMENT TOTAL KNEE BILATERAL Bilateral 2008  . THYROIDECTOMY     80%  . VAGINAL WOUND CLOSURE / REPAIR        Social History:      Social History   Tobacco Use  . Smoking status: Former Smoker    Packs/day: 1.00    Years: 35.00    Pack years: 35.00    Quit date: 03/29/1981    Years since quitting: 37.5  . Smokeless tobacco: Never Used  Substance Use Topics  . Alcohol use: Never    Frequency: Never       Family History :     Family History  Problem Relation Age of Onset  . Heart disease Mother   . Hypertension Mother   .  Heart disease Father   . Hypertension Father        Home Medications:   Prior to Admission medications   Medication Sig Start Date End Date Taking? Authorizing Provider  acetaminophen (TYLENOL) 500 MG tablet Take 1,000 mg by mouth every 6 (six) hours as needed for mild pain.    [provider]  albuterol (PROVENTIL) (2.5 MG/3ML) 0.083% nebulizer solution Take 3 mLs (2.5 mg total) by nebulization 4 (four) times daily. 09/16/18   Mariel Aloe, MD  albuterol (VENTOLIN HFA) 108 (90 Base) MCG/ACT inhaler Inhale 2 puffs into the lungs every 6 (six) hours as needed for wheezing or shortness of breath.    [provider]  calcitRIOL (ROCALTROL) 0.25 MCG capsule Take 0.25 mcg by mouth daily.    [provider]  clonazePAM (KLONOPIN) 1 MG tablet Take 1 tablet (1 mg total) by mouth 2 (two) times daily as needed for anxiety. Change in dose 08/08/18   Nche, Charlene Brooke, NP  FLUoxetine (PROZAC) 10 MG tablet Take 1 tablet (10 mg total) by mouth daily. 08/08/18   Nche, Charlene Brooke, NP  furosemide (LASIX) 80 MG tablet Take 80 mg by mouth See admin instructions. Sunday, Monday,Wed,Friday--in AM     [provider]  insulin  detemir (LEVEMIR) 100 UNIT/ML injection Inject 0.15 mLs (15 Units total) into the skin daily. 09/16/18   Mariel Aloe, MD  insulin lispro (HUMALOG) 100 UNIT/ML injection Inject 1-5 Units into the skin 3 (three) times daily with meals as needed for high blood sugar. Sliding scale     [provider]  ipratropium (ATROVENT) 0.02 % nebulizer solution Inhale 0.5 mg into the lungs 4 (four) times daily.     [provider]  levothyroxine (SYNTHROID) 112 MCG tablet Take 1 tablet (112 mcg total) by mouth daily before breakfast. 1 AM Patient taking differently: Take 112 mcg by mouth daily before breakfast.  07/04/18   Nche, Charlene Brooke, NP  lidocaine (LIDODERM) 5 % Place 1 patch onto the skin every 12 (twelve) hours as needed (back pain). Remove & Discard patch within 12 hours or as directed by MD     [provider]  midodrine (PROAMATINE) 10 MG tablet Take 10 mg by mouth 3 (three) times daily. Taking on Dialysis days, Tues, Thur, Sat.    [provider]  montelukast (SINGULAIR) 10 MG tablet Take 10 mg by mouth at bedtime. PM    [provider]  nystatin (MYCOSTATIN/NYSTOP) powder Apply 1 g topically 2 (two) times daily as needed (skin rash).     [provider]  nystatin cream (MYCOSTATIN) Apply 1 application topically 2 (two) times daily as needed for dry skin.     [provider]  rosuvastatin (CRESTOR) 40 MG tablet Take 40 mg by mouth daily. AM    [provider]  senna-docusate (SENNA PLUS) 8.6-50 MG tablet Take 1 tablet by mouth at bedtime. 07/04/18   Nche, Charlene Brooke, NP  sevelamer (RENAGEL) 800 MG tablet Take 1,600 mg by mouth 3 (three) times daily with meals. 1600 mg --3 times daily--take additional 1-2 if eats snack    [provider]     Allergies:     Allergies  Allergen Reactions  . Avelox [Moxifloxacin Hcl In Nacl]     PT does not remember  . Codeine Anaphylaxis    PT does not remember  . Other     PT  does not remember  . Penicillins  Rash  . Sulfa Antibiotics     PT does nolt remember  . Shellfish Allergy Hives, Itching and Rash     Physical Exam:   Vitals  Blood pressure 134/79, pulse 92, temperature 97.8 F (36.6 C), temperature source Rectal, resp. rate (!) 25, height 5\' 1"  (1.549 m), weight 108.9 kg, SpO2 96 %.  1.  General: axoxo3  2. Psychiatric: euthymic  3. Neurologic: cn2-12 intact, reflexes 2+ symmetric, diffuse with no clonus, motor 5/5 in all 4 ext  4. HEENMT:  Anicteric, pupils 1.17mm symmetric, direct, consensual, near intact Neck: no jvd  5. Respiratory : Prolonged exp phase,  No crackles,  Slight exp wheezing, slightly tight  6. Cardiovascular : rrr s1, s2, no m/g/r  7. Gastrointestinal:  Abd: soft, nt, nd, +bs  8. Skin:  Ext: no c/c/e, no rash  9.Musculoskeletal:  Good rom    Data Review:    CBC Recent Labs  Lab 09/23/18 2320  WBC 8.0  HGB 8.6*  HCT 28.9*  PLT 143*  MCV 100.3*  MCH 29.9  MCHC 29.8*  RDW 16.7*  LYMPHSABS 1.1  MONOABS 0.6  EOSABS 0.2  BASOSABS 0.0   ------------------------------------------------------------------------------------------------------------------  Results for orders placed or performed during the hospital encounter of 09/23/18 (from the past 48 hour(s))  CBC with Differential     Status: Abnormal   Collection Time: 09/23/18 11:20 PM  Result Value Ref Range   WBC 8.0 4.0 - 10.5 K/uL   RBC 2.88 (L) 3.87 - 5.11 MIL/uL   Hemoglobin 8.6 (L) 12.0 - 15.0 g/dL   HCT 28.9 (L) 36.0 - 46.0 %   MCV 100.3 (H) 80.0 - 100.0 fL   MCH 29.9 26.0 - 34.0 pg   MCHC 29.8 (L) 30.0 - 36.0 g/dL   RDW 16.7 (H) 11.5 - 15.5 %   Platelets 143 (L) 150 - 400 K/uL   nRBC 0.0 0.0 - 0.2 %   Neutrophils Relative % 76 %   Neutro Abs 6.1 1.7 - 7.7 K/uL   Lymphocytes Relative 14 %   Lymphs Abs 1.1 0.7 - 4.0 K/uL   Monocytes Relative 8 %   Monocytes Absolute 0.6 0.1 - 1.0 K/uL   Eosinophils Relative 2 %   Eosinophils  Absolute 0.2 0.0 - 0.5 K/uL   Basophils Relative 0 %   Basophils Absolute 0.0 0.0 - 0.1 K/uL   Immature Granulocytes 0 %   Abs Immature Granulocytes 0.03 0.00 - 0.07 K/uL    Comment: Performed at [facility], [address], [place], [place] [id number]  Basic metabolic panel     Status: Abnormal   Collection Time: 09/23/18 11:20 PM  Result Value Ref Range   Sodium 135 135 - 145 mmol/L   Potassium 5.4 (H) 3.5 - 5.1 mmol/L   Chloride 90 (L) 98 - 111 mmol/L   CO2 25 22 - 32 mmol/L   Glucose, Bld 207 (H) 70 - 99 mg/dL   BUN 54 (H) 8 - 23 mg/dL   Creatinine, Ser 7.87 (H) 0.44 - 1.00 mg/dL   Calcium 8.0 (L) 8.9 - 10.3 mg/dL   GFR calc non Af Amer 5 (L) >60 mL/min   GFR calc Af Amer 5 (L) >60 mL/min   Anion gap 20 (H) 5 - 15    Comment: Performed at [facility] [address] [place], [place] [address]  Magnesium     Status: Abnormal   Collection Time: 09/23/18 11:20 PM  Result Value Ref  Range   Magnesium 2.6 (H) 1.7 - 2.4 mg/dL    Comment: Performed at [facility] [address], [place], [place] [address]  CBG monitoring, ED     Status: Abnormal   Collection Time: 09/24/18  1:05 AM  Result Value Ref Range   Glucose-Capillary 173 (H) 70 - 99 mg/dL  SARS Coronavirus 2 [facility] order, Performed in [facility] hospital lab) Nasopharyngeal Nasopharyngeal Swab     Status: None   Collection Time: 09/24/18  1:08 AM   Specimen: Nasopharyngeal Swab  Result Value Ref Range   SARS Coronavirus 2 NEGATIVE NEGATIVE    Comment: (NOTE) If result is NEGATIVE SARS-CoV-2 target nucleic acids are NOT DETECTED. The SARS-CoV-2 RNA is generally detectable in upper and lower  respiratory specimens during the acute phase of infection. The lowest  concentration of SARS-CoV-2 viral copies this assay can detect is 250  copies / mL. A negative result does not preclude SARS-CoV-2 infection  and should not be used as the sole basis for treatment or other  patient management decisions.   A negative result may occur with  improper specimen collection / handling, submission of specimen other  than nasopharyngeal swab, presence of viral mutation(s) within the  areas targeted by this assay, and inadequate number of viral copies  (<250 copies / mL). A negative result must be combined with clinical  observations, patient history, and epidemiological information. If result is POSITIVE SARS-CoV-2 target nucleic acids are DETECTED. The SARS-CoV-2 RNA is generally detectable in upper and lower  respiratory specimens dur ing the acute phase of infection.  Positive  results are indicative of active infection with SARS-CoV-2.  Clinical  correlation with patient history and other diagnostic information is  necessary to determine patient infection status.  Positive results do  not rule out bacterial infection or co-infection with other viruses. If result is PRESUMPTIVE POSTIVE SARS-CoV-2 nucleic acids MAY BE PRESENT.   A presumptive positive result was obtained on the submitted specimen  and confirmed on repeat testing.  While 2019 novel coronavirus  (SARS-CoV-2) nucleic acids may be present in the submitted sample  additional confirmatory testing may be necessary for epidemiological  and / or clinical management purposes  to differentiate between  SARS-CoV-2 and other Sarbecovirus currently known to infect humans.  If clinically indicated additional testing with an alternate test  methodology [phone]) is advised. The SARS-CoV-2 RNA is generally  detectable in upper and lower respiratory sp ecimens during the acute  phase of infection. The expected result is Negative. Fact Sheet for Patients:  StrictlyIdeas.no Fact Sheet for Healthcare Providers: BankingDealers.co.za This test is not yet approved or cleared by the [place] FDA and has been authorized for detection and/or diagnosis of SARS-CoV-2 by FDA under an Emergency Use  Authorization (EUA).  This EUA will remain in effect (meaning this test can be used) for the duration of the COVID-19 declaration under Section 564(b)(1) of the Act, 21 U.S.C. section 360bbb-3(b)(1), unless the authorization is terminated or revoked sooner. Performed at [facility], [place] [address] [place], [place] [address]     Chemistries  Recent Labs  Lab 09/23/18 2320  NA 135  K 5.4*  CL 90*  CO2 25  GLUCOSE 207*  BUN 54*  CREATININE 7.87*  CALCIUM 8.0*  MG 2.6*   ------------------------------------------------------------------------------------------------------------------  ------------------------------------------------------------------------------------------------------------------ GFR: Estimated Creatinine Clearance: 7 mL/min (A) (by C-G formula based on SCr of 7.87 mg/dL (H)). Liver Function Tests: No results for input(s): AST, ALT,  ALKPHOS, BILITOT, PROT, ALBUMIN in the last 168 hours. No results for input(s): LIPASE, AMYLASE in the last 168 hours. No results for input(s): AMMONIA in the last 168 hours. Coagulation Profile: No results for input(s): INR, PROTIME in the last 168 hours. Cardiac Enzymes: No results for input(s): CKTOTAL, CKMB, CKMBINDEX, TROPONINI in the last 168 hours. BNP (last 3 results) No results for input(s): PROBNP in the last 8760 hours. HbA1C: No results for input(s): HGBA1C in the last 72 hours. CBG: Recent Labs  Lab 09/24/18 0105  GLUCAP 173*   Lipid Profile: No results for input(s): CHOL, HDL, LDLCALC, TRIG, CHOLHDL, LDLDIRECT in the last 72 hours. Thyroid Function Tests: No results for input(s): TSH, T4TOTAL, FREET4, T3FREE, THYROIDAB in the last 72 hours. Anemia Panel: No results for input(s): VITAMINB12, FOLATE, FERRITIN, TIBC, IRON, RETICCTPCT in the last 72 hours.  --------------------------------------------------------------------------------------------------------------- Urine analysis:    Component  Value Date/Time   COLORURINE YELLOW 04/20/2018 0301   APPEARANCEUR HAZY (A) 04/20/2018 0301   LABSPEC 1.009 04/20/2018 0301   PHURINE 9.0 (H) 04/20/2018 0301   GLUCOSEU 150 (A) 04/20/2018 0301   HGBUR MODERATE (A) 04/20/2018 0301   BILIRUBINUR NEGATIVE 04/20/2018 0301   KETONESUR NEGATIVE 04/20/2018 0301   PROTEINUR 100 (A) 04/20/2018 0301   NITRITE NEGATIVE 04/20/2018 0301   LEUKOCYTESUR NEGATIVE 04/20/2018 0301      Imaging Results:    Dg Chest Portable 1 View  Result Date: 09/24/2018 CLINICAL DATA:  Shortness of breath EXAM: PORTABLE CHEST 1 VIEW COMPARISON:  09/15/2018, 09/11/2018, 09/09/2018, 05/18/2018 FINDINGS: Right-sided central venous catheter with tips over the SVC and proximal right atrium. Stable enlargement of the cardio mediastinum. Diffuse bilateral fine nodular and slightly reticular pattern, possibly due to chronic interstitial lung disease. Superimposed streaky basilar atelectasis. No pneumothorax. Aortic atherosclerosis. IMPRESSION: 1. Stable cardiomegaly with streaky basilar atelectasis 2. Similar appearance of diffuse bilateral fine reticular interstitial opacity, suspected to be due to chronic interstitial disease. Electronically Signed   By: Donavan Foil M.D.   On: 09/24/2018 00:59    ekg nsr at 100, nl axis, nl int, early R progression, no st-t changes c/w ischemia   Assessment & Plan:    Principal Problem:   Acute respiratory failure with hypoxia (HCC) Active Problems:   Type 2 diabetes mellitus with diabetic polyneuropathy, with long-term current use of insulin (HCC)   Hypothyroidism   ESRD (end stage renal disease) (HCC)  Acute respiratory failure w hypoxia Solumedrol 80mg  iv q8h zithromax 500mg  iv qday Cont Singulair 10mg  po qday Cont Atrovent neb qid Cont Albuterol neb qid Albuterol neb q6h prn   Diarrhea Gi pathogen panel C. Diff  ESRD on HD T, T, S Cont Renagel  Cont Calcitriol Cont Proamatine Please consult nephrology in am  Dm2  Fsbs ac and qhs,   Hypothyrodiism Cont Levothyroxine 112 micrograms po qday  Hyperlipidemia Cont Crestor 40mg  po qhs  Depression/ Anxiety Cont Fluoxetine 10mg  po qday Cont Clonazepam 1mg  po bid prn anxiety  H/o Subdural hematoma   DVT Prophylaxis-    - SCDs   AM Labs Ordered, also please review Full Orders  Family Communication: Admission, patients condition and plan of care including tests being ordered have been discussed with the patient  who indicate understanding and agree with the plan and Code Status.  Code Status:  DNR,   Admission status: Inpatient: Based on patients clinical presentation and evaluation of above clinical data, I have made determination that patient meets Inpatient criteria at this time.  Pt will  require iv steroids, and iv abx for acute respiratory failure with hypoxia secondary to Copd exacerbation, pt will require > 2 nites stay ,  Pt has high risk of clinical deterioration without admission.   Time spent in minutes : 70    Jani Gravel M.D on 09/24/2018 at 3:17 AM

## 2018-09-24 NOTE — Progress Notes (Signed)
[facility] consulted, spoke with [facility] to arrange dialysis , Thanks

## 2018-09-25 DIAGNOSIS — Z515 Encounter for palliative care: Secondary | ICD-10-CM

## 2018-09-25 DIAGNOSIS — I5033 Acute on chronic diastolic (congestive) heart failure: Secondary | ICD-10-CM

## 2018-09-25 DIAGNOSIS — Z7189 Other specified counseling: Secondary | ICD-10-CM

## 2018-09-25 DIAGNOSIS — R627 Adult failure to thrive: Secondary | ICD-10-CM

## 2018-09-25 DIAGNOSIS — J9621 Acute and chronic respiratory failure with hypoxia: Principal | ICD-10-CM

## 2018-09-25 DIAGNOSIS — Z992 Dependence on renal dialysis: Secondary | ICD-10-CM

## 2018-09-25 LAB — GLUCOSE, CAPILLARY
Glucose-Capillary: 191 mg/dL — ABNORMAL HIGH (ref 70–99)
Glucose-Capillary: 239 mg/dL — ABNORMAL HIGH (ref 70–99)
Glucose-Capillary: 238 mg/dL — ABNORMAL HIGH (ref 70–99)
Glucose-Capillary: 170 mg/dL — ABNORMAL HIGH (ref 70–99)

## 2018-09-25 LAB — COMPREHENSIVE METABOLIC PANEL
ALT: 146 U/L — ABNORMAL HIGH (ref 0–44)
AST: 81 U/L — ABNORMAL HIGH (ref 15–41)
Albumin: 2.9 g/dL — ABNORMAL LOW (ref 3.5–5.0)
Alkaline Phosphatase: 219 U/L — ABNORMAL HIGH (ref 38–126)
Anion gap: 13 (ref 5–15)
BUN: 25 mg/dL — ABNORMAL HIGH (ref 8–23)
CO2: 27 mmol/L (ref 22–32)
Calcium: 8.8 mg/dL — ABNORMAL LOW (ref 8.9–10.3)
Chloride: 93 mmol/L — ABNORMAL LOW (ref 98–111)
Creatinine, Ser: 4.09 mg/dL — ABNORMAL HIGH (ref 0.44–1.00)
GFR calc Af Amer: 12 mL/min — ABNORMAL LOW (ref >60)
GFR calc non Af Amer: 10 mL/min — ABNORMAL LOW (ref >60)
Glucose, Bld: 203 mg/dL — ABNORMAL HIGH (ref 70–99)
Potassium: 4.6 mmol/L (ref 3.5–5.1)
Sodium: 133 mmol/L — ABNORMAL LOW (ref 135–145)
Total Bilirubin: 0.7 mg/dL (ref 0.3–1.2)
Total Protein: 5.9 g/dL — ABNORMAL LOW (ref 6.5–8.1)

## 2018-09-25 LAB — CBC
HCT: 27.6 % — ABNORMAL LOW (ref 36.0–46.0)
Hemoglobin: 8.3 g/dL — ABNORMAL LOW (ref 12.0–15.0)
MCH: 29.9 pg (ref 26.0–34.0)
MCHC: 30.1 g/dL (ref 30.0–36.0)
MCV: 99.3 fL (ref 80.0–100.0)
Platelets: 172 10*3/uL (ref 150–400)
RBC: 2.78 MIL/uL — ABNORMAL LOW (ref 3.87–5.11)
RDW: 16.2 % — ABNORMAL HIGH (ref 11.5–15.5)
WBC: 8.7 10*3/uL (ref 4.0–10.5)
nRBC: 0 % (ref 0.0–0.2)

## 2018-09-25 NOTE — Progress Notes (Signed)
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 PCCM consulted for further recommendations.   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 09/07/2018 for same, also hospitalized  8/13/8/21/2020 for COPD exacerbation and A/C resp failure presented to ED via EMS with generalized weakness and diarrhea that began today. She endorsed 3-4 episodes of loose stools with weakness. She is unable to tell me if she had any blood in her stools. She has HD on TThS schedule, missed Tuesday but received HD on Thursday. In the ED she c/o SOB and requested to take 2 puffs of her own inhaler. SpO2 63% on her baseline 6lpm. She was placed on 12lpm high flow in ED. O2 sat improved to 89%. She denies fevers or chills. She reports her breathing is at her baseline. Denies chest pain, N/V, abdominal pain. PCCM consulted for further recommendations.   Of note per palliative care note 09/16/2018 "New MOST form was completed with following elections:              DNR,              Limited scope interventions- rehospitalization is acceptable, continue dialysis             No IV fluids, no feeding tube             Determine use or limitations of antibiotics when infection occurs"  Pt also has Gold form DNR.   She has both DNR form and MOST form with her.  Past Medical History  Morbid Obesity - BMI 45  ESRD - HD T,Th,S DM II  SDH - discharged  09/07/18 Chronic Hypotension - on midodrine  COPD  Former Smoker - quit 1983, > 1ppd for 22 years. Husband was a smoker and currently lives with a smoker.  Chronic hypoxic respiratory failure on 6lpm O2 Severe PH OSA  Significant Hospital Events   NA  Consults:  PCCM   Procedures:  NA  Significant Diagnostic Tests:  NA  Micro Data:  C diff 8/28>> Stool 8/28>> SARS CoV2 >>Neg   Antimicrobials:  NA  Interim history/subjective:  Up in chair on O2 . Talking with son.  Requires 10L O2 to keep sats >90%   Objective   Blood pressure 104/68, pulse 76, temperature 98.5 F (36.9 C), temperature source Oral, resp. rate (!) 21, height 5\' 1"  (1.549 m), weight 106.1 kg, SpO2 97 %.        Intake/Output Summary (Last 24 hours) at 09/25/2018 1203 Last data filed at 09/25/2018 0900 Gross per 24 hour  Intake 843 ml  Output 4200 ml  Net -3357 ml   Filed Weights   09/24/18 1125 09/24/18 1554 09/25/18 0500  Weight: 111.3 kg 107.3 kg 106.1 kg    Examination: General: NAD, elderly  HEENT: MM pink/moist, poor dentition Neuro: Awake, alert, oriented to self, time and place. 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   Generalized weakness and diarrhea: afebrile and mildly tachycardic. No hypotension. No leukocytosis. --Can follow up on stool studies and start treatment as appropriate. Per MOST form she is okay with limited antibiotics if needed.  Chronic hypoxic respiratory failure, COPD,  suspected OSA: O2 sat goal was lowered at last hospitalization to facilitate her ability to go home with her goals of care. She is not in COPD exacerbation as her breathing/cough is at baseline. --While she is here would target O2 sat of 88% which is currently achievable with HFNC. --BiPAP QHS and prn sleep --Can do Brovana, Pulmicort neb BID, duoneb Q6hrs --Albuterol q4 hrs prn dyspnea/wheezing --Continue Singulair  Pulmonary will sign off 8/30 , call if needed .   Best practice:  Diet: per primary Pain/Anxiety/Delirium protocol (if indicated): NA VAP protocol (if indicated): NA DVT prophylaxis: per primary GI prophylaxis: NA Glucose control: per primary Mobility: bedrest/bedbound at baseline Code Status: DNR/DNI Family Communication: per primary  Disposition: appropriate for step down  Labs   CBC: Recent Labs  Lab 09/23/18 2320 09/24/18 0429 09/25/18 0248  WBC 8.0 11.6* 8.7  NEUTROABS 6.1  --   --   HGB 8.6* 7.8* 8.3*  HCT 28.9* 25.3* 27.6*  MCV 100.3* 99.6 99.3  PLT 143* 148* [id number]    Basic Metabolic Panel: Recent Labs  Lab 09/23/18 2320 09/24/18 0429 09/25/18 0248  NA 135 134* 133*  K 5.4* 5.5* 4.6  CL 90* 91* 93*  CO2 25 27 27   GLUCOSE 207* 165* 203*  BUN 54* 59* 25*  CREATININE 7.87* 8.24* 4.09*  CALCIUM 8.0* 8.2* 8.8*  MG 2.6*  --   --    GFR: Estimated Creatinine Clearance: 13.3 mL/min (A) (by C-G formula based on SCr of 4.09 mg/dL (H)). Recent Labs  Lab 09/23/18 2320 09/24/18 0429 09/25/18 0248  WBC 8.0 11.6* 8.7    Liver Function Tests: Recent Labs  Lab 09/24/18 0429 09/25/18 0248  AST 235* 81*  ALT 182* 146*  ALKPHOS 250* 219*  BILITOT 0.9 0.7  PROT 6.2* 5.9*  ALBUMIN 3.0* 2.9*   No results for input(s): LIPASE, AMYLASE in the last 168 hours. No results for input(s): AMMONIA in the last 168 hours.  ABG    Component Value Date/Time   PHART 7.439 08/22/2018 2015   PCO2ART 41.5 08/22/2018 2015   PO2ART 122.0 (H) 08/22/2018 2015    HCO3 30.4 (H) 09/08/2018 1541   TCO2 32 09/08/2018 1541   ACIDBASEDEF 2.0 08/01/2018 0521   O2SAT 98.0 09/08/2018 1541     Coagulation Profile: No results for input(s): INR, PROTIME in the last 168 hours.  Cardiac Enzymes: No results for input(s): CKTOTAL, CKMB, CKMBINDEX, TROPONINI in the last 168 hours.  HbA1C: Hgb A1c MFr Bld  Date/Time Value Ref Range Status  07/01/2018 11:49 AM 6.1 (H) <5.7 % of total Hgb Final    Comment:    For someone without known diabetes, a hemoglobin  A1c value between 5.7% and 6.4% is consistent with prediabetes and should be confirmed with a  follow-up test. . For someone  with known diabetes, a value <7% indicates that their diabetes is well controlled. A1c targets should be individualized based on duration of diabetes, age, comorbid conditions, and other considerations. . This assay result is consistent with an increased risk of diabetes. . Currently, no consensus exists regarding use of hemoglobin A1c for diagnosis of diabetes for children. .     CBG: Recent Labs  Lab 09/24/18 0105 09/24/18 0730 09/24/18 2110 09/25/18 0616 09/25/18 1114  GLUCAP 173* 165* 291* 191* 239*      Rainn Bullinger NP-C  [facility]  [phone]  09/25/2018

## 2018-09-25 NOTE — Progress Notes (Signed)
Admit: 09/23/2018 LOS: 1  66F ESRD TTS recent admission for weakness/falls hx/o SDH, FTT rec SNF but decided to go home, readmit with SOB, diarrhea, inc hypoxia  Subjective:  . HD yesterday, 4L UF . Diarrhea abated . Still HF [place] . Considering SNF  08/29 0701 - 08/30 0700 In: [id number] [P.O.:840; I.V.:3] Out: 4300 [Urine:300]  Filed Weights   09/24/18 1125 09/24/18 1554 09/25/18 0500  Weight: 111.3 kg 107.3 kg 106.1 kg    Scheduled Meds: . albuterol  2.5 mg Nebulization QID  . arformoterol  15 mcg Nebulization BID  . budesonide (PULMICORT) nebulizer solution  0.25 mg Nebulization BID  . calcitRIOL  0.25 mcg Oral Daily  . Chlorhexidine Gluconate Cloth  6 each Topical Q0600  . FLUoxetine  10 mg Oral Daily  . insulin aspart  0-5 Units Subcutaneous QHS  . insulin aspart  0-9 Units Subcutaneous TID WC  . insulin detemir  15 Units Subcutaneous Daily  . ipratropium  0.5 mg Inhalation QID  . levothyroxine  112 mcg Oral QAC breakfast  . midodrine  10 mg Oral TID WC  . montelukast  10 mg Oral QHS  . rosuvastatin  40 mg Oral Daily  . sevelamer carbonate  1,600 mg Oral TID WC  . sodium chloride flush  3 mL Intravenous Q12H   Continuous Infusions: . sodium chloride     PRN Meds:.sodium chloride, acetaminophen **OR** acetaminophen, albuterol, clonazePAM, sodium chloride flush  Current Labs: reviewed   [facility] 4h 400/1.5 105.5kg 2/2 bath Hep none TDC Calcitriol 1.5 mcg PO qHD Sensipar 60 mg PO qHD renvela 800mg  3 tabs AC Midodrine 10 mg PO TIW before HD and mid treatment for SBP <95  Physical Exam:  Blood pressure (!) 136/111, pulse 78, temperature (!) 97.5 F (36.4 C), temperature source Axillary, resp. rate 18, height 5\' 1"  (1.549 m), weight 106.1 kg, SpO2 95 %. Chronically ill appearing, appears more comfortable today Breathing more comfortably this AM Reagular, , nl s1s2 no murmur rub S/nt 2+ Edema  A 1. ESRD [facility] [place] 2. Hypoxia / COPD / ?PNA /  Edema, COVID neg, inc O2 req 3. Diarrhea, resolved, transient 4. FTT 5. Recent SDH 6. DM2 7. MDD/SDH  P . HD tomorrow, 4-5L UF, Albumin/Midodrine for BP support. No heparin 2/2 #5.  2K bath, TDC . needs SNF if agreeable . Medication Issues; o Preferred narcotic agents for pain control are hydromorphone, fentanyl, and methadone. Morphine should not be used.  o Baclofen should be avoided o Avoid oral sodium phosphate and magnesium citrate based laxatives / bowel preps    Pearson Grippe MD 09/25/2018, 8:32 AM  Recent Labs  Lab 09/23/18 2320 09/24/18 0429 09/25/18 0248  NA 135 134* 133*  K 5.4* 5.5* 4.6  CL 90* 91* 93*  CO2 25 27 27   GLUCOSE 207* 165* 203*  BUN 54* 59* 25*  CREATININE 7.87* 8.24* 4.09*  CALCIUM 8.0* 8.2* 8.8*   Recent Labs  Lab 09/23/18 2320 09/24/18 0429 09/25/18 0248  WBC 8.0 11.6* 8.7  NEUTROABS 6.1  --   --   HGB 8.6* 7.8* 8.3*  HCT 28.9* 25.3* 27.6*  MCV 100.3* 99.6 99.3  PLT 143* 148* 172

## 2018-09-25 NOTE — Plan of Care (Signed)

## 2018-09-25 NOTE — Progress Notes (Signed)
Pt sat in a recliner for 3-4 hours this evening, tolerated very well. Oxygen at 10L via HFNC , saturation running between 92-96. Pt denies chest pain, will continue to monitor

## 2018-09-25 NOTE — Progress Notes (Signed)
RT NOTE:  RT explained to pt that she would wear BIPAP tonight during sleep per MD order. Pt refuses. She stated she does not wear anything @ home and she didn't believe she could tolerate a mask on her face while trying to sleep. RT explained therapy, however, patient continued to refuse. She stated she would wear O2. RT explained we will monitor her for changes in WOB and O2 and if anything changed we would have no choice but to put BIPAP on. RT will monitor.

## 2018-09-25 NOTE — Progress Notes (Signed)
PROGRESS NOTE   Joan Mosley  [id number]    DOB: 04-19-1945    DOA: 09/23/2018  PCP: Flossie Buffy, NP   I have briefly reviewed patients previous medical records in [facility].  Chief Complaint  Patient presents with   Weakness   Diarrhea    Brief Narrative:  73 year old female, lives with her family, nonambulatory/electric wheelchair mobile, PMH of end-stage COPD, chronic respiratory failure on home oxygen 5 L/min, ESRD on TTS HD, morbid obesity, DM 2 with renal complications, hypothyroid, HLD, hypotension, anxiety and depression, recent hospitalization 8/11-8/12 for subdural hematoma following fall, managed conservatively, declined SNF, 4 hospital admissions in the last 6 months, presented to [facility] ED via EMS on 8/28 due to generalized weakness, diarrhea and dyspnea.  She was admitted for acute on chronic hypoxic respiratory failure, advanced COPD, less likely an exacerbation, possible volume overload/pulmonary edema, diarrhea and hyperkalemia.  Seen by PCCM in ED.  Nephrology consulted for HD. PMT consulted for [place] discussion given FTT.  S/p HD with 4 L UF on 8/29.  Better but still remains on 10 L/min HFNC oxygen.   Assessment & Plan:   Principal Problem:   Acute respiratory failure with hypoxia (HCC) Active Problems:   Type 2 diabetes mellitus with diabetic polyneuropathy, with long-term current use of insulin (HCC)   Hypothyroidism   ESRD (end stage renal disease) (HCC)   Acute on chronic respiratory failure with hypoxia/end-stage COPD/suspected OSA  Suspect due to pulmonary edema complicating end-stage COPD and less likely due to COPD exacerbation.  Volume management across HD, 4 L UF at HD yesterday.  Completed brief course of IV steroids yesterday  Titrate oxygen to maintain saturations between 88-92%.  Remains on 10 L/min HF [place] oxygen and unable to wean down.  Can try BiPAP nightly and PRN.  Brovana, Pulmicort and bronchodilator nebulizations.   Continue Singulair.  Plan for repeat dialysis for volume management on 8/31.  Diarrhea  Transient and resolved.  ESRD on TTS HD/volume overload   Reportedly missed her dialysis on Tuesday.  Had dialysis on Thursday.  Appears volume overloaded but better compared to yesterday.  Nephrology input appreciated, underwent HD 8/29 and next HD planned for 8/31  Hyperkalemia  Resolved after HD.  Acute on chronic diastolic CHF  TTE [id number]: LVEF 60-65%.  Volume management across HD.  Management as indicated above.  Type II DM with hyperglycemia and renal complications.  Steroids discontinued yesterday which should help.  Continue Levemir 15 units daily and SSI.  Monitor CBGs closely and adjust insulins as needed.  Hypothyroid Continue levothyroxine.  Hyperlipidemia Continue Crestor.  Anxiety and depression Continue fluoxetine and as needed clonazepam.  History of recent subdural hematoma Managed conservatively. No focal deficits.  Morbid obesity/Body mass index is 44.2 kg/m.  Anemia in ESRD  No bleeding reported.  At baseline hemoglobin appears to run in the 8 g range.  Hemoglobin up from 7.8-8.3.  Follow CBC at HD. and transfuse if hemoglobin 7 g or less.  Thrombocytopenia, mild  Resolved  Abnormal LFTs  Suspect due to passive hepatic congestion.  No GI symptoms.  Improving.  Follow CMP.  Adult failure to thrive Secondary to advanced age and multiple severe significant and many irreversible comorbidities. Ongoing frequent rehospitalization's. I discussed in detail with patient's son Mr. Addison Abbondanza on 8/29 who understands and agrees with her overall poor prognosis and is very interested in discussing with PMT regarding goals of care, he is also interested in considering hospice.  He plans  to speak with his mother later when she returns from HD.  PMT consult placed.  Son indicated that patient does not have a HCPOA.  I discussed with the PMT team to try and get  her early consult.  DVT prophylaxis: SCD Code Status: DNR Family Communication: Discussed in detail with patient's son on 8/29, updated care and answered questions.  None at bedside today. Disposition: DC home pending clinical improvement.  May need serial HD's given volume overload   Consultants:  PCCM Nephrology  Procedures:  HD  Antimicrobials:  Azithromycin   Subjective: Denies complaints.  Denies dyspnea.  No diarrhea reported.  Patient does not seem to be great historian.  As per RN, no acute issues noted.  Objective:  Vitals:   09/25/18 0718 09/25/18 0742 09/25/18 0757 09/25/18 1016  BP: 126/84 (!) 136/111  104/68  Pulse: 74 76 78 76  Resp: (!) 22 16 18  (!) 21  Temp: (!) 97.5 F (36.4 C)   98.5 F (36.9 C)  TempSrc: Axillary   Oral  SpO2: 92% 93% 95% 97%  Weight:      Height:        Examination:  General exam: Elderly female, moderately built and morbidly obese sitting up in bed with no obvious distress. Respiratory system: Slightly diminished breath sounds in the bases with occasional basal crackles.  Otherwise clear to auscultation without wheezing, rhonchi.  No increased work of breathing. Cardiovascular system: S1 and S2 heard, RRR.  No JVD or murmurs.  Still has 1+ pitting bilateral leg/ankle edema.  Telemetry personally reviewed: Sinus rhythm. Gastrointestinal system: Abdomen is nondistended, soft and nontender. No organomegaly or masses felt. Normal bowel sounds heard. Central nervous system: Alert and oriented x2. No focal neurological deficits. Extremities: Symmetric 5 x 5 power. Skin: No rashes, lesions or ulcers Psychiatry: Judgement and insight appear impaired. Mood & affect appropriate.     Data Reviewed: I have personally reviewed following labs and imaging studies  CBC: Recent Labs  Lab 09/23/18 2320 09/24/18 0429 09/25/18 0248  WBC 8.0 11.6* 8.7  NEUTROABS 6.1  --   --   HGB 8.6* 7.8* 8.3*  HCT 28.9* 25.3* 27.6*  MCV 100.3* 99.6  99.3  PLT 143* 148* [id number]   Basic Metabolic Panel: Recent Labs  Lab 09/23/18 2320 09/24/18 0429 09/25/18 0248  NA 135 134* 133*  K 5.4* 5.5* 4.6  CL 90* 91* 93*  CO2 25 27 27   GLUCOSE 207* 165* 203*  BUN 54* 59* 25*  CREATININE 7.87* 8.24* 4.09*  CALCIUM 8.0* 8.2* 8.8*  MG 2.6*  --   --    Liver Function Tests: Recent Labs  Lab 09/24/18 0429 09/25/18 0248  AST 235* 81*  ALT 182* 146*  ALKPHOS 250* 219*  BILITOT 0.9 0.7  PROT 6.2* 5.9*  ALBUMIN 3.0* 2.9*    Cardiac Enzymes: No results for input(s): CKTOTAL, CKMB, CKMBINDEX, TROPONINI in the last 168 hours.  CBG: Recent Labs  Lab 09/24/18 0105 09/24/18 0730 09/24/18 2110 09/25/18 0616 09/25/18 1114  GLUCAP 173* 165* 291* 191* 239*    Recent Results (from the past 240 hour(s))  SARS Coronavirus 2 [facility] order, Performed in [facility] hospital lab) Nasopharyngeal Nasopharyngeal Swab     Status: None   Collection Time: 09/24/18  1:08 AM   Specimen: Nasopharyngeal Swab  Result Value Ref Range Status   SARS Coronavirus 2 NEGATIVE NEGATIVE Final    Comment: (NOTE) If result is NEGATIVE SARS-CoV-2 target nucleic acids are NOT DETECTED. The  SARS-CoV-2 RNA is generally detectable in upper and lower  respiratory specimens during the acute phase of infection. The lowest  concentration of SARS-CoV-2 viral copies this assay can detect is 250  copies / mL. A negative result does not preclude SARS-CoV-2 infection  and should not be used as the sole basis for treatment or other  patient management decisions.  A negative result may occur with  improper specimen collection / handling, submission of specimen other  than nasopharyngeal swab, presence of viral mutation(s) within the  areas targeted by this assay, and inadequate number of viral copies  (<250 copies / mL). A negative result must be combined with clinical  observations, patient history, and epidemiological information. If result is POSITIVE SARS-CoV-2  target nucleic acids are DETECTED. The SARS-CoV-2 RNA is generally detectable in upper and lower  respiratory specimens dur ing the acute phase of infection.  Positive  results are indicative of active infection with SARS-CoV-2.  Clinical  correlation with patient history and other diagnostic information is  necessary to determine patient infection status.  Positive results do  not rule out bacterial infection or co-infection with other viruses. If result is PRESUMPTIVE POSTIVE SARS-CoV-2 nucleic acids MAY BE PRESENT.   A presumptive positive result was obtained on the submitted specimen  and confirmed on repeat testing.  While 2019 novel coronavirus  (SARS-CoV-2) nucleic acids may be present in the submitted sample  additional confirmatory testing may be necessary for epidemiological  and / or clinical management purposes  to differentiate between  SARS-CoV-2 and other Sarbecovirus currently known to infect humans.  If clinically indicated additional testing with an alternate test  methodology [phone]) is advised. The SARS-CoV-2 RNA is generally  detectable in upper and lower respiratory sp ecimens during the acute  phase of infection. The expected result is Negative. Fact Sheet for Patients:  StrictlyIdeas.no Fact Sheet for Healthcare Providers: BankingDealers.co.za This test is not yet approved or cleared by the [place] FDA and has been authorized for detection and/or diagnosis of SARS-CoV-2 by FDA under an Emergency Use Authorization (EUA).  This EUA will remain in effect (meaning this test can be used) for the duration of the COVID-19 declaration under Section 564(b)(1) of the Act, 21 U.S.C. section 360bbb-3(b)(1), unless the authorization is terminated or revoked sooner. Performed at [facility], [place] [address] [place],  [address]          Radiology Studies: Dg Chest Portable 1 View  Result Date:  09/24/2018 CLINICAL DATA:  Shortness of breath EXAM: PORTABLE CHEST 1 VIEW COMPARISON:  09/15/2018, 09/11/2018, 09/09/2018, 05/18/2018 FINDINGS: Right-sided central venous catheter with tips over the SVC and proximal right atrium. Stable enlargement of the cardio mediastinum. Diffuse bilateral fine nodular and slightly reticular pattern, possibly due to chronic interstitial lung disease. Superimposed streaky basilar atelectasis. No pneumothorax. Aortic atherosclerosis. IMPRESSION: 1. Stable cardiomegaly with streaky basilar atelectasis 2. Similar appearance of diffuse bilateral fine reticular interstitial opacity, suspected to be due to chronic interstitial disease. Electronically Signed   By: Donavan Foil M.D.   On: 09/24/2018 00:59        Scheduled Meds:  albuterol  2.5 mg Nebulization QID   arformoterol  15 mcg Nebulization BID   budesonide (PULMICORT) nebulizer solution  0.25 mg Nebulization BID   calcitRIOL  0.25 mcg Oral Daily   Chlorhexidine Gluconate Cloth  6 each Topical Q0600   FLUoxetine  10 mg Oral Daily   insulin aspart  0-5 Units Subcutaneous QHS  insulin aspart  0-9 Units Subcutaneous TID WC   insulin detemir  15 Units Subcutaneous Daily   ipratropium  0.5 mg Inhalation QID   levothyroxine  112 mcg Oral QAC breakfast   midodrine  10 mg Oral TID WC   montelukast  10 mg Oral QHS   rosuvastatin  40 mg Oral Daily   sevelamer carbonate  1,600 mg Oral TID WC   sodium chloride flush  3 mL Intravenous Q12H   Continuous Infusions:  sodium chloride       LOS: 1 day     Vernell Leep, MD, FACP, [facility]. [facility]  To contact the attending provider between 7A-7P or the covering provider during after hours 7P-7A, please log into the web site [email or web address] and access using universal [place] password for that web site. If you do not have the password, please call the hospital operator.  09/25/2018, 11:50 AM

## 2018-09-25 NOTE — Consult Note (Signed)
Consultation Note Date: 09/25/2018   Patient Name: Joan Mosley  DOB: 07/17/1945  MRN: [id number]  Age / Sex: 73 y.o., female  PCP: Nche, Charlene Brooke, NP Referring Physician: Modena Jansky, MD  Reason for Consultation: Establishing goals of care  HPI/Patient Profile: 73 y.o. female  with past medical history of ESRD on HD, end stage COPD, OSA, cor pulmonale, chronic resp failure 6L, SDH with short term memory deficits, bed/chair bound, admitted on 09/23/2018 with weakness, diarrhea, found to be volume overloaded. Diarrhea resolving, respiratory status improving but she is requiring 10L [place]. Plan for repeat HD tomorrow to improve volume status. Palliative medicine consulted for [place] and for HCPOA.  Clinical Assessment and Goals of Care: Reviewed chart and met with patient and her son, Joan Mosley. Patient and family are familiar to me as I met with them during her last admission- see consult on 8/21.  At that consult patient was unable to engage in [place] and I met with her adult children for decision making.  Today patient is awake, alert and oriented.  We discussed HCPOA- she selected her son [name] to be her HCPOA - paperwork was completed and Chaplain consult placed so that it could be notarized- patient did not wish to complete Advanced Directives.  Patient tells me she lives at home with her son and daughter- Joan Mosley and Joan Mosley. She pays their bills, and notes that when she is done paying their bills- "there's nothing left for me". Eric at bedside states that he has offered for her to come live with him to relieve her of the duty of taking care of her adult children. When discussing her health and her illness trajectory- [name] says, "I'm dying! I know I"m dying! I just want everyone to stop talking about it! When I die, I will die! When God takes me, that's fine, it will be my time to go! But right now I want  to live!"  We discussed the fact that she missed dialysis which caused her to return to the hospital. She says she does not like dialysis because it is boring- however, as long as it is keeping her alive, she plans to continue. She cannot remember why she missed her dialysis last week. She has transportation services in place.  She is not interested in stopping dialysis or considering Hospice. She desires to continue full scope of medical care.  Regarding disposition she is adamant that she wants to go home and refuses a rehab facility. She says she would like to move out of her trailer and into an apartment on a lower floor. She notes she does not leave her home, and when she asks her children to take her outside they refuse. She also expresses frustration with the fact that they take all of her money- she notes that she pays their bills "and there's no money left even to buy me new underwear".   Primary Decision Maker PATIENT    SUMMARY OF RECOMMENDATIONS -Chaplain consult to notarize HCPOA -Unfortunately, although patient is eligible  for and would benefit from Hospice services under her respiratory diagnosis- Hospice will not accept patient's who are undergoing dialysis treatment -Recommend referral for outpatient Palliative - this was ordered on previous admission as well -I am concerned about the amount of care that is being provided by her children Joan Mosley and Joan Mosley and the indication that they are not providing for patient adequately, will discuss this with social worker- it may be that going back home under their care is not safe and my no longer be an option- especially if they are allowing her to miss dialysis sessions  Code Status/Advance Care Planning:  DNR  Discharge Planning: Home with Palliative Services  Primary Diagnoses: Present on Admission: . Acute respiratory failure with hypoxia ([place]) . Hypothyroidism . ESRD (end stage renal disease) ([place])   I have reviewed the medical  record, interviewed the patient and family, and examined the patient. The following aspects are pertinent.  Past Medical History:  Diagnosis Date  . Arthritis   . COPD (chronic obstructive pulmonary disease) ([place])   . Diabetes mellitus without complication ([place])   . Diabetic neuropathy ([facility])   . 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 Renal disorder    ESRD  . Thyroid disease   . TIA (transient ischemic attack)    Social History   Socioeconomic History  . Marital status: Widowed    Spouse name: Not on file  . Number of children: 5  . Years of education: Not on file  . Highest education level: Not on file  Occupational History    Comment: disabled  Social Needs  . Financial resource strain: Not on file  . Food insecurity    Worry: Not on file    Inability: Not on file  . Transportation needs    Medical: Not on file    Non-medical: Not on file  Tobacco Use  . Smoking status: Former Smoker    Packs/day: 1.00    Years: 35.00    Pack years: 35.00    Quit date: 03/29/1981    Years since quitting: 37.5  . Smokeless tobacco: Never Used  Substance and Sexual Activity  . Alcohol use: Never    Frequency: Never  . Drug use: Never  . Sexual activity: Not Currently  Lifestyle  . Physical activity    Days per week: Not on file    Minutes per session: Not on file  . Stress: Not on file  Relationships  . Social Herbalist on phone: Not on file    Gets together: Not on file    Attends religious service: Not on file    Active member of club or organization: Not on file    Attends meetings of clubs or organizations: Not on file    Relationship status: Not on file  Other Topics Concern  . Not on file  Social History Narrative  . Not on file   Family History  Problem Relation Age of Onset  . Heart disease Mother   . Hypertension Mother   . Heart disease Father   . Hypertension Father    Scheduled Meds: . albuterol  2.5 mg Nebulization QID  . arformoterol   15 mcg Nebulization BID  . budesonide (PULMICORT) nebulizer solution  0.25 mg Nebulization BID  . calcitRIOL  0.25 mcg Oral Daily  . Chlorhexidine Gluconate Cloth  6 each Topical Q0600  . FLUoxetine  10 mg Oral Daily  . insulin aspart  0-5 Units Subcutaneous  QHS  . insulin aspart  0-9 Units Subcutaneous TID WC  . insulin detemir  15 Units Subcutaneous Daily  . ipratropium  0.5 mg Inhalation QID  . levothyroxine  112 mcg Oral QAC breakfast  . midodrine  10 mg Oral TID WC  . montelukast  10 mg Oral QHS  . rosuvastatin  40 mg Oral Daily  . sevelamer carbonate  1,600 mg Oral TID WC  . sodium chloride flush  3 mL Intravenous Q12H   Continuous Infusions: . sodium chloride     PRN Meds:.sodium chloride, acetaminophen **OR** acetaminophen, albuterol, clonazePAM, sodium chloride flush Medications Prior to Admission:  Prior to Admission medications   Medication Sig Start Date End Date Taking? Authorizing Provider  acetaminophen (TYLENOL) 500 MG tablet Take 1,000 mg by mouth every 6 (six) hours as needed for mild pain.   Yes [provider]  albuterol (PROVENTIL) (2.5 MG/3ML) 0.083% nebulizer solution Take 3 mLs (2.5 mg total) by nebulization 4 (four) times daily. 09/16/18  Yes Mariel Aloe, MD  albuterol (VENTOLIN HFA) 108 (90 Base) MCG/ACT inhaler Inhale 2 puffs into the lungs every 6 (six) hours as needed for wheezing or shortness of breath.   Yes [provider]  calcitRIOL (ROCALTROL) 0.25 MCG capsule Take 0.25 mcg by mouth daily.   Yes [provider]  clonazePAM (KLONOPIN) 1 MG tablet Take 1 tablet (1 mg total) by mouth 2 (two) times daily as needed for anxiety. Change in dose 08/08/18  Yes Nche, Charlene Brooke, NP  FLUoxetine (PROZAC) 10 MG tablet Take 1 tablet (10 mg total) by mouth daily. 08/08/18  Yes Nche, Charlene Brooke, NP  furosemide (LASIX) 80 MG tablet Take 80 mg by mouth See admin instructions. Take 64m by mouth on SUN MON WED FRI   Yes [provider]  insulin detemir (LEVEMIR) 100 UNIT/ML injection Inject 0.15 mLs (15 Units total) into the skin daily. 09/16/18  Yes NMariel Aloe MD  insulin lispro (HUMALOG) 100 UNIT/ML injection Inject 1-5 Units into the skin 3 (three) times daily with meals as needed for high blood sugar. Sliding scale    Yes [provider]  ipratropium (ATROVENT) 0.02 % nebulizer solution Inhale 0.5 mg into the lungs 4 (four) times daily.    Yes [provider]  levothyroxine (SYNTHROID) 112 MCG tablet Take 1 tablet (112 mcg total) by mouth daily before breakfast. 1 AM Patient taking differently: Take 112 mcg by mouth daily before breakfast.  07/04/18  Yes Nche, CCharlene Brooke NP  lidocaine (LIDODERM) 5 % Place 1 patch onto the skin every 12 (twelve) hours as needed (back pain). Remove & Discard patch within 12 hours or as directed by MD    Yes [provider]  midodrine (PROAMATINE) 10 MG tablet Take 10 mg by mouth 3 (three) times daily. Taking on Dialysis days, Tues, Thur, Sat.   Yes [provider]  montelukast (SINGULAIR) 10 MG tablet Take 10 mg by mouth at bedtime. PM   Yes [provider]  nystatin (MYCOSTATIN/NYSTOP) powder Apply 1 g topically 2 (two) times daily as needed (skin rash).    Yes [provider]  nystatin cream (MYCOSTATIN) Apply 1 application topically 2 (two) times daily as needed for dry skin.    Yes [provider]  rosuvastatin (CRESTOR) 40 MG tablet Take 40 mg by mouth daily. AM   Yes [provider]  senna-docusate (SENNA PLUS) 8.6-50 MG tablet Take 1 tablet by mouth at bedtime. 07/04/18  Yes Nche, Charlene Brooke, NP  sevelamer (RENAGEL) 800 MG tablet Take 1,600 mg by mouth 3 (three) times daily with meals. 1600 mg --3 times daily--take additional 1-2 if eats snack   Yes [provider]   Allergies  Allergen Reactions  . Avelox [Moxifloxacin Hcl In Nacl] Other (See Comments)    PT does not remember  .  Codeine Anaphylaxis    PT does not remember  . Penicillins Rash    Did it involve swelling of the face/tongue/throat, SOB, or low BP? Unknown Did it involve sudden or severe rash/hives, skin peeling, or any reaction on the inside of your mouth or nose? Yes Did you need to seek medical attention at a hospital or doctor's office? Unknown When did it last happen?unk If all above answers are "NO", may proceed with cephalosporin use.   . Sulfa Antibiotics Other (See Comments)    PT does nolt remember  . Shellfish Allergy Hives, Itching and Rash   Review of Systems  Constitutional: Positive for activity change and fatigue. Negative for appetite change.  Respiratory: Positive for shortness of breath.   Gastrointestinal: Positive for diarrhea.  All other systems reviewed and are negative.   Physical Exam Vitals signs and nursing note reviewed.  Cardiovascular:     Rate and Rhythm: Normal rate.  Pulmonary:     Effort: Pulmonary effort is normal.  Skin:    General: Skin is warm and dry.  Neurological:     Mental Status: She is alert and oriented to person, place, and time.  Psychiatric:        Mood and Affect: Mood normal.        Behavior: Behavior normal.     Vital Signs: BP 104/68 (BP Location: Left Arm)   Pulse 76   Temp 98.5 F (36.9 C) (Oral)   Resp (!) 21   Ht _0  (1.549 m)   Wt 106.1 kg   SpO2 97%   BMI 44.20 kg/m  Pain Scale: 0-10   Pain Score: 0-No pain   SpO2: SpO2: 97 % O2 Device:SpO2: 97 % O2 Flow Rate: .O2 Flow Rate (L/min): 10 L/min  IO: Intake/output summary:   Intake/Output Summary (Last 24 hours) at 09/25/2018 1059 Last data filed at 09/25/2018 0900 Gross per 24 hour  Intake 843 ml  Output 4200 ml  Net -3357 ml    LBM: Last BM Date: 09/22/18 Baseline Weight: Weight: 108.9 kg Most recent weight: Weight: 106.1 kg     Palliative Assessment/Data: PPS: 30%     Thank you for this consult. Palliative medicine will continue to follow and  assist as needed.   Time In: 1400 Time Out: 1530 Time Total: 90 minutes Greater than 50%  of this time was spent counseling and coordinating care related to the above assessment and plan.  Signed by: Mariana Kaufman, AGNP-C Palliative Medicine    Please contact Palliative Medicine Team phone at [phone] for questions and concerns.  For individual provider: See Shea Evans

## 2018-09-26 DIAGNOSIS — R945 Abnormal results of liver function studies: Secondary | ICD-10-CM

## 2018-09-26 LAB — GLUCOSE, CAPILLARY
Glucose-Capillary: 151 mg/dL — ABNORMAL HIGH (ref 70–99)
Glucose-Capillary: 134 mg/dL — ABNORMAL HIGH (ref 70–99)
Glucose-Capillary: 152 mg/dL — ABNORMAL HIGH (ref 70–99)
Glucose-Capillary: 138 mg/dL — ABNORMAL HIGH (ref 70–99)
Glucose-Capillary: 153 mg/dL — ABNORMAL HIGH (ref 70–99)

## 2018-09-26 LAB — CBC
HCT: 28.1 % — ABNORMAL LOW (ref 36.0–46.0)
Hemoglobin: 8.5 g/dL — ABNORMAL LOW (ref 12.0–15.0)
MCH: 30.4 pg (ref 26.0–34.0)
MCHC: 30.2 g/dL (ref 30.0–36.0)
MCV: 100.4 fL — ABNORMAL HIGH (ref 80.0–100.0)
Platelets: 176 10*3/uL (ref 150–400)
RBC: 2.8 MIL/uL — ABNORMAL LOW (ref 3.87–5.11)
RDW: 16.3 % — ABNORMAL HIGH (ref 11.5–15.5)
WBC: 11.9 10*3/uL — ABNORMAL HIGH (ref 4.0–10.5)
nRBC: 0 % (ref 0.0–0.2)

## 2018-09-26 LAB — RENAL FUNCTION PANEL
Albumin: 3 g/dL — ABNORMAL LOW (ref 3.5–5.0)
Anion gap: 16 — ABNORMAL HIGH (ref 5–15)
BUN: 53 mg/dL — ABNORMAL HIGH (ref 8–23)
CO2: 25 mmol/L (ref 22–32)
Calcium: 8 mg/dL — ABNORMAL LOW (ref 8.9–10.3)
Chloride: 91 mmol/L — ABNORMAL LOW (ref 98–111)
Creatinine, Ser: 6.85 mg/dL — ABNORMAL HIGH (ref 0.44–1.00)
GFR calc Af Amer: 6 mL/min — ABNORMAL LOW (ref >60)
GFR calc non Af Amer: 5 mL/min — ABNORMAL LOW (ref >60)
Glucose, Bld: 174 mg/dL — ABNORMAL HIGH (ref 70–99)
Phosphorus: 8.3 mg/dL — ABNORMAL HIGH (ref 2.5–4.6)
Potassium: 4.3 mmol/L (ref 3.5–5.1)
Sodium: 132 mmol/L — ABNORMAL LOW (ref 135–145)

## 2018-09-26 LAB — HEPATIC FUNCTION PANEL
ALT: 89 U/L — ABNORMAL HIGH (ref 0–44)
AST: 27 U/L (ref 15–41)
Albumin: 3 g/dL — ABNORMAL LOW (ref 3.5–5.0)
Alkaline Phosphatase: 185 U/L — ABNORMAL HIGH (ref 38–126)
Bilirubin, Direct: 0.1 mg/dL (ref 0.0–0.2)
Indirect Bilirubin: 0.5 mg/dL (ref 0.3–0.9)
Total Bilirubin: 0.6 mg/dL (ref 0.3–1.2)
Total Protein: 6 g/dL — ABNORMAL LOW (ref 6.5–8.1)

## 2018-09-26 MED ORDER — IPRATROPIUM-ALBUTEROL 0.5-2.5 (3) MG/3ML IN SOLN
3.0000 mL | Freq: Three times a day (TID) | RESPIRATORY_TRACT | Status: DC
  Administered 2018-09-26 – 2018-10-04 (×19): 3 mL via RESPIRATORY_TRACT
  Filled 2018-09-26 (×24): qty 3

## 2018-09-26 MED ORDER — ALBUTEROL SULFATE (2.5 MG/3ML) 0.083% IN NEBU
2.5000 mg | INHALATION_SOLUTION | Freq: Three times a day (TID) | RESPIRATORY_TRACT | Status: DC
  Administered 2018-09-26: 14:00:00 2.5 mg via RESPIRATORY_TRACT
  Filled 2018-09-26: qty 3

## 2018-09-26 MED ORDER — IPRATROPIUM BROMIDE 0.02 % IN SOLN
0.5000 mg | Freq: Three times a day (TID) | RESPIRATORY_TRACT | Status: DC
  Administered 2018-09-26: 0.5 mg via RESPIRATORY_TRACT
  Filled 2018-09-26: qty 2.5

## 2018-09-26 MED ORDER — HEPARIN SODIUM (PORCINE) 1000 UNIT/ML IJ SOLN
INTRAMUSCULAR | Status: AC
  Administered 2018-09-26: 1000 [IU]
  Filled 2018-09-26: qty 4

## 2018-09-26 MED ORDER — MIDODRINE HCL 5 MG PO TABS
ORAL_TABLET | ORAL | Status: AC
  Administered 2018-09-26: 17:00:00
  Filled 2018-09-26: qty 2

## 2018-09-26 NOTE — Progress Notes (Signed)
Admit: 09/23/2018 LOS: 2  27F ESRD TTS recent admission for weakness/falls hx/o SDH, FTT rec SNF but decided to go home, readmit with SOB, diarrhea, inc hypoxia  Subjective: considering SNF  08/30 0701 - 08/31 0700 In: 1063 [P.O.:1060; I.V.:3] Out: 150 [Urine:150]  Filed Weights   09/25/18 0500 09/26/18 0319 09/26/18 0725  Weight: 106.1 kg 106.9 kg 107 kg    Scheduled Meds: . albuterol  2.5 mg Nebulization TID  . arformoterol  15 mcg Nebulization BID  . budesonide (PULMICORT) nebulizer solution  0.25 mg Nebulization BID  . calcitRIOL  0.25 mcg Oral Daily  . Chlorhexidine Gluconate Cloth  6 each Topical Q0600  . FLUoxetine  10 mg Oral Daily  . heparin      . insulin aspart  0-5 Units Subcutaneous QHS  . insulin aspart  0-9 Units Subcutaneous TID WC  . insulin detemir  15 Units Subcutaneous Daily  . ipratropium  0.5 mg Inhalation TID  . levothyroxine  112 mcg Oral QAC breakfast  . midodrine      . midodrine  10 mg Oral TID WC  . montelukast  10 mg Oral QHS  . rosuvastatin  40 mg Oral Daily  . sevelamer carbonate  1,600 mg Oral TID WC  . sodium chloride flush  3 mL Intravenous Q12H   Continuous Infusions: . sodium chloride     PRN Meds:.sodium chloride, acetaminophen **OR** acetaminophen, albuterol, clonazePAM, sodium chloride flush  Current Labs: reviewed   [facility] 4h 400/1.5 105.5kg 2/2 bath Hep none TDC Calcitriol 1.5 mcg PO qHD Sensipar 60 mg PO qHD renvela 800mg  3 tabs AC Midodrine 10 mg PO TIW before HD and mid treatment for SBP <95  Physical Exam:  Blood pressure 117/68, pulse 82, temperature 97.8 F (36.6 C), temperature source Oral, resp. rate 16, height 5\' 1"  (1.549 m), weight 107 kg, SpO2 94 %. Chronically ill appearing, no disterss, on HD Chest cta bilat Regular, nl s1s2 no murmur rub Abd soft ntnd obese 1-2+ Edema LE's NF, gen weakness  A 1. ESRD TTS via TDC 2. Hypoxia / COPD / ?PNA / Edema, COVID neg, inc O2 req 3. Diarrhea,  resolved, transient 4. FTT 5. Recent SDH 6. DM2 7. MDD/SDH  P . HD today, 4-5L [facility] . needs SNF if agreeable . Medication Issues; o Preferred narcotic agents for pain control are hydromorphone, fentanyl, and methadone. Morphine should not be used.  o Baclofen should be avoided o Avoid oral sodium phosphate and magnesium citrate based laxatives / bowel preps    Kelly Splinter, MD 09/26/2018, 11:27 AM   Recent Labs  Lab 09/24/18 0429 09/25/18 0248 09/26/18 0822  NA 134* 133* 132*  K 5.5* 4.6 4.3  CL 91* 93* 91*  CO2 27 27 25   GLUCOSE 165* 203* 174*  BUN 59* 25* 53*  CREATININE 8.24* 4.09* 6.85*  CALCIUM 8.2* 8.8* 8.0*  PHOS  --   --  8.3*   Recent Labs  Lab 09/23/18 2320 09/24/18 0429 09/25/18 0248 09/26/18 0821  WBC 8.0 11.6* 8.7 11.9*  NEUTROABS 6.1  --   --   --   HGB 8.6* 7.8* 8.3* 8.5*  HCT 28.9* 25.3* 27.6* 28.1*  MCV 100.3* 99.6 99.3 100.4*  PLT 143* 148* 172 176

## 2018-09-26 NOTE — Progress Notes (Signed)
Was unable to get notary down to see Joan Mosley.  When I went to update her, she was sleeping.  Will follow-up tomorrow with notary and witnesses.
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
## 2018-09-26 NOTE — Progress Notes (Signed)
PROGRESS NOTE   Joan Mosley  [id number]    DOB: Feb 24, 1945    DOA: 09/23/2018  PCP: Flossie Buffy, NP   I have briefly reviewed patients previous medical records in [facility].  Chief Complaint  Patient presents with  . Weakness  . Diarrhea    Brief Narrative:  73 year old female, lives with her family, nonambulatory/electric wheelchair mobile, PMH of end-stage COPD, chronic respiratory failure on home oxygen 5 L/min, ESRD on TTS HD, morbid obesity, DM 2 with renal complications, hypothyroid, HLD, hypotension, anxiety and depression, recent hospitalization 8/11-8/12 for subdural hematoma following fall, managed conservatively, declined SNF, 4 hospital admissions in the last 6 months, presented to [facility] ED via EMS on 8/28 due to generalized weakness, diarrhea and dyspnea.  She was admitted for acute on chronic hypoxic respiratory failure, advanced COPD, less likely an exacerbation, possible volume overload/pulmonary edema, diarrhea and hyperkalemia.  Seen by PCCM in ED.  Nephrology consulted for HD. PMT consulted for [place] discussion given FTT.  S/p HD with 4 L UF on 8/29.  Better but still remains on 10 L/min HFNC oxygen. Repeat HD on 8/31. Wean O2 to prior home level as tolerated prior to safe DC home. PMT consulted, DNR, continue HD and patient wants to return home and declines SNF.   Assessment & Plan:   Principal Problem:   Acute respiratory failure with hypoxia (HCC) Active Problems:   Type 2 diabetes mellitus with diabetic polyneuropathy, with long-term current use of insulin (HCC)   Hypothyroidism   ESRD (end stage renal disease) (HCC)   Acute on chronic respiratory failure with hypoxia/end-stage COPD/suspected OSA  Suspect due to pulmonary edema complicating end-stage COPD and less likely due to COPD exacerbation.  Volume management across HD, 4 L UF at HD on 8/29.  Completed brief course of IV steroids.  Titrate oxygen to maintain saturations between 88-92%.   Remains on 10 L/min HF [place] oxygen overnight and this am and unable to wean down yet.  Can try BiPAP nightly and PRN> but patient refused last night.  Brovana, Pulmicort and bronchodilator nebulizations.  Continue Singulair.  Repeat dialysis today for further volume Mx.  Diarrhea  Transient and resolved.  ESRD on TTS HD/volume overload   Reportedly missed her dialysis on Tuesday PTA.  Had dialysis on Thursday however.  Appears volume overloaded but better compared to on admission.  Nephrology input appreciated, underwent HD 8/29 and next HD planned today  Hyperkalemia  Resolved after HD.  Acute on chronic diastolic CHF  TTE 99991111: LVEF 60-65%.  Volume management across HD.  Management as indicated above.  Type II DM with hyperglycemia and renal complications.  Steroids discontinued likely helping  Continue Levemir 15 units daily and SSI.  Monitor CBGs closely and adjust insulins as needed. No change today.  Hypothyroid  Continue levothyroxine.  Hyperlipidemia  Continue Crestor.  Anxiety and depression  Continue fluoxetine and as needed clonazepam.  History of recent subdural hematoma  Managed conservatively.  No focal deficits.  Morbid obesity/Body mass index is 44.57 kg/m.  Anemia in ESRD  No bleeding reported.  At baseline hemoglobin appears to run in the 8 g range.  Hemoglobin stable low 8 g range.  Follow CBC at HD. and transfuse if hemoglobin 7 g or less.  Thrombocytopenia, mild  Resolved  Abnormal LFTs  Suspect due to passive hepatic congestion.  No GI symptoms.  Improving.  Follow CMP, not done today, added.  Adult failure to thrive  Secondary to advanced age  and multiple severe significant and many irreversible comorbidities.  Ongoing frequent rehospitalization's.  PMT did [place] with patient and son on 8/30> new HCPOA (son), continue HD and hence not Hospice eligible, OP Palliative referral, DNR, declines SNF and wishes to return home.   DVT prophylaxis: SCD Code Status: DNR Family Communication: Discussed in detail with patient's son on 8/29, updated care and answered questions.  None at bedside today. Disposition: DC home pending clinical improvement and Nephrology clearance.   Consultants:  PCCM Nephrology  Procedures:  HD  Antimicrobials:  Azithromycin   Subjective: Overnight events noted> declined BIPAP, as discussed with night RN, unable to wean down O2. She is eating breakfast. Denies SOB but is on HFNC O2 at 10L/min.  Objective:  Vitals:   09/26/18 0830 09/26/18 0900 09/26/18 0930 09/26/18 1000  BP: 138/72 125/72 113/68 123/62  Pulse: 76 67 70 80  Resp:      Temp:      TempSrc:      SpO2:      Weight:      Height:        Examination:  General exam: Elderly female, moderately built and morbidly obese sitting up in bed with no obvious distress, eating breakfast. Respiratory system: Reduced BS's in bases with basal crackles. Rest clear to auscultation. No increased work of breathing. Cardiovascular system: S1 and S2 heard, RRR. NO JVD or murmur. Ongoing 1+ pitting bilateral leg/ankle edema.  Telemetry personally reviewed: Sinus rhythm. Gastrointestinal system: Abdomen is nondistended, soft and nontender. No organomegaly or masses felt. Normal bowel sounds heard. Central nervous system: Alert and oriented x2. No focal neurological deficits. Extremities: Symmetric 5 x 5 power. Skin: No rashes, lesions or ulcers Psychiatry: Judgement and insight appear impaired. Mood & affect appropriate.     Data Reviewed: I have personally reviewed following labs and imaging studies  CBC: Recent Labs  Lab 09/23/18 2320 09/24/18 0429 09/25/18 0248 09/26/18 0821  WBC 8.0 11.6* 8.7 11.9*  NEUTROABS 6.1  --   --   --   HGB 8.6* 7.8* 8.3* 8.5*  HCT 28.9* 25.3* 27.6* 28.1*  MCV 100.3* 99.6 99.3 100.4*  PLT 143* 148* 172 0000000   Basic Metabolic Panel: Recent Labs  Lab 09/23/18 2320 09/24/18 0429  09/25/18 0248 09/26/18 0822  NA 135 134* 133* 132*  K 5.4* 5.5* 4.6 4.3  CL 90* 91* 93* 91*  CO2 25 27 27 25   GLUCOSE 207* 165* 203* 174*  BUN 54* 59* 25* 53*  CREATININE 7.87* 8.24* 4.09* 6.85*  CALCIUM 8.0* 8.2* 8.8* 8.0*  MG 2.6*  --   --   --   PHOS  --   --   --  8.3*   Liver Function Tests: Recent Labs  Lab 09/24/18 0429 09/25/18 0248 09/26/18 0822  AST 235* 81*  --   ALT 182* 146*  --   ALKPHOS 250* 219*  --   BILITOT 0.9 0.7  --   PROT 6.2* 5.9*  --   ALBUMIN 3.0* 2.9* 3.0*    Cardiac Enzymes: No results for input(s): CKTOTAL, CKMB, CKMBINDEX, TROPONINI in the last 168 hours.  CBG: Recent Labs  Lab 09/25/18 0616 09/25/18 1114 09/25/18 1609 09/25/18 2134 09/26/18 0638  GLUCAP 191* 239* 238* 170* 134*    Recent Results (from the past 240 hour(s))  SARS Coronavirus 2 [facility] order, Performed in [facility] hospital lab) Nasopharyngeal Nasopharyngeal Swab     Status: None   Collection Time: 09/24/18  1:08 AM  Specimen: Nasopharyngeal Swab  Result Value Ref Range Status   SARS Coronavirus 2 NEGATIVE NEGATIVE Final    Comment: (NOTE) If result is NEGATIVE SARS-CoV-2 target nucleic acids are NOT DETECTED. The SARS-CoV-2 RNA is generally detectable in upper and lower  respiratory specimens during the acute phase of infection. The lowest  concentration of SARS-CoV-2 viral copies this assay can detect is 250  copies / mL. A negative result does not preclude SARS-CoV-2 infection  and should not be used as the sole basis for treatment or other  patient management decisions.  A negative result may occur with  improper specimen collection / handling, submission of specimen other  than nasopharyngeal swab, presence of viral mutation(s) within the  areas targeted by this assay, and inadequate number of viral copies  (<250 copies / mL). A negative result must be combined with clinical  observations, patient history, and epidemiological information. If result is  POSITIVE SARS-CoV-2 target nucleic acids are DETECTED. The SARS-CoV-2 RNA is generally detectable in upper and lower  respiratory specimens dur ing the acute phase of infection.  Positive  results are indicative of active infection with SARS-CoV-2.  Clinical  correlation with patient history and other diagnostic information is  necessary to determine patient infection status.  Positive results do  not rule out bacterial infection or co-infection with other viruses. If result is PRESUMPTIVE POSTIVE SARS-CoV-2 nucleic acids MAY BE PRESENT.   A presumptive positive result was obtained on the submitted specimen  and confirmed on repeat testing.  While 2019 novel coronavirus  (SARS-CoV-2) nucleic acids may be present in the submitted sample  additional confirmatory testing may be necessary for epidemiological  and / or clinical management purposes  to differentiate between  SARS-CoV-2 and other Sarbecovirus currently known to infect humans.  If clinically indicated additional testing with an alternate test  methodology [phone]) is advised. The SARS-CoV-2 RNA is generally  detectable in upper and lower respiratory sp ecimens during the acute  phase of infection. The expected result is Negative. Fact Sheet for Patients:  StrictlyIdeas.no Fact Sheet for Healthcare Providers: BankingDealers.co.za This test is not yet approved or cleared by the [place] FDA and has been authorized for detection and/or diagnosis of SARS-CoV-2 by FDA under an Emergency Use Authorization (EUA).  This EUA will remain in effect (meaning this test can be used) for the duration of the COVID-19 declaration under Section 564(b)(1) of the Act, 21 U.S.C. section 360bbb-3(b)(1), unless the authorization is terminated or revoked sooner. Performed at [facility], [place] [address], [place], [place] [address]          Radiology Studies: No results found.       Scheduled Meds: . midodrine      . albuterol  2.5 mg Nebulization TID  . arformoterol  15 mcg Nebulization BID  . budesonide (PULMICORT) nebulizer solution  0.25 mg Nebulization BID  . calcitRIOL  0.25 mcg Oral Daily  . Chlorhexidine Gluconate Cloth  6 each Topical Q0600  . FLUoxetine  10 mg Oral Daily  . insulin aspart  0-5 Units Subcutaneous QHS  . insulin aspart  0-9 Units Subcutaneous TID WC  . insulin detemir  15 Units Subcutaneous Daily  . ipratropium  0.5 mg Inhalation TID  . levothyroxine  112 mcg Oral QAC breakfast  . midodrine  10 mg Oral TID WC  . montelukast  10 mg Oral QHS  . rosuvastatin  40 mg Oral Daily  . sevelamer carbonate  1,600 mg Oral  TID WC  . sodium chloride flush  3 mL Intravenous Q12H   Continuous Infusions: . sodium chloride       LOS: 2 days     Vernell Leep, MD, FACP, [facility]. [facility]  To contact the attending provider between 7A-7P or the covering provider during after hours 7P-7A, please log into the web site [email or web address] and access using universal [place] password for that web site. If you do not have the password, please call the hospital operator.  09/26/2018, 10:22 AM

## 2018-09-26 NOTE — Plan of Care (Signed)
  Problem: Education: Goal: Knowledge of General Education information will improve Description: Including pain rating scale, medication(s)/side effects and non-pharmacologic comfort measures Outcome: Progressing   Problem: Clinical Measurements: Goal: Respiratory complications will improve Outcome: Progressing   Problem: Nutrition: Goal: Adequate nutrition will be maintained Outcome: Progressing   Problem: Coping: Goal: Level of anxiety will decrease Outcome: Progressing   Problem: Pain Managment: Goal: General experience of comfort will improve Outcome: Progressing   Problem: Skin Integrity: Goal: Risk for impaired skin integrity will decrease Outcome: Progressing

## 2018-09-27 DIAGNOSIS — F419 Anxiety disorder, unspecified: Secondary | ICD-10-CM

## 2018-09-27 LAB — GLUCOSE, CAPILLARY
Glucose-Capillary: 140 mg/dL — ABNORMAL HIGH (ref 70–99)
Glucose-Capillary: 120 mg/dL — ABNORMAL HIGH (ref 70–99)
Glucose-Capillary: 208 mg/dL — ABNORMAL HIGH (ref 70–99)
Glucose-Capillary: 95 mg/dL (ref 70–99)

## 2018-09-27 MED ORDER — CHLORHEXIDINE GLUCONATE CLOTH 2 % EX PADS
6.0000 | MEDICATED_PAD | Freq: Every day | CUTANEOUS | Status: DC
  Administered 2018-09-28 – 2018-10-04 (×4): 6 via TOPICAL

## 2018-09-27 MED ORDER — CLONAZEPAM 0.5 MG PO TABS
1.0000 mg | ORAL_TABLET | Freq: Three times a day (TID) | ORAL | Status: DC | PRN
  Administered 2018-09-29 (×2): 1 mg via ORAL
  Filled 2018-09-27: qty 2

## 2018-09-27 NOTE — Progress Notes (Signed)
Advanced Directive paperwork was notarized and signed by witnesses.  Patient was given original and one other copy.

## 2018-09-27 NOTE — Progress Notes (Signed)
Daily Progress Note   Patient Name: Joan Mosley       Date: 09/27/2018 DOB: 1945-11-27  Age: 73 y.o. MRN#: [id number] Attending Physician: Darliss Cheney, MD Primary Care Physician: Flossie Buffy, NP Admit Date: 09/23/2018  Reason for Consultation/Follow-up: Establishing goals of care  Subjective:  I met with the patient, her son [name], and her son [name], at the bedside.  Patient was awake alert and oriented.  We discussed advanced directives and completed a new MOST form.  Patient selected DO NOT RESUSCITATE, limited medical interventions, determine use or limitations of antibiotics when infection occurs, IV fluids for defined trial, and no feeding tube.  The new MOST form was placed on patient's chart  It is important to note that while patient can participate in goals of care discussion she will immediately forget our conversation by tomorrow.  We discussed that we will have a conversation today and we will not discuss it again during her admission unless her status changes.  She continues to state I want to go home.  We discussed that her current oxygen requirements are too much for her to go home and that if she goes home it would only be to go home and be kept comfortable until she reached end-of-life.  When it is put that way she says that is not her goal that she wishes to remain in the hospital until she can go home and live.  Healthcare power of attorney papers were left for her to discuss with [name] and [name] and complete a chaplain consult was placed for notorization.  Social issues at home were discussed.  There are concerns that her children Altamese Dilling, and [name] are not providing adequate care.  She states that they are.  Additionally, these concerns have been discussed with our hospital  social worker who has been in contact with a county Education officer, museum who has been involved with this family.  APS will be contacted and they will follow-up and investigate.  Symptom management was discussed.  Patient has shortness of breath and anxiety.  Typically, I would prefer to use an oral opioid for shortness of breath.  However patient has a history of anaphylaxis to codeine.  Given her renal failure I cannot use morphine.  I am hesitant to prescribe any opioid for her to go home with  given the social issues in the home with family members stealing prescriptions as reported by her son [name].  Evidently, son Altamese Dilling, has a history of trafficking Fentanyl.  As she is still desiring aggressive care and is not on comfort measures I do not think opioids are indicated at this point given all the mitigating circumstances.  She does note that she takes clonazepam at home and this is helpful with her anxiety she has it as needed 2 times a day now I will increase it to 3 times a day.   Length of Stay: 3  Current Medications: Scheduled Meds:  . arformoterol  15 mcg Nebulization BID  . budesonide (PULMICORT) nebulizer solution  0.25 mg Nebulization BID  . calcitRIOL  0.25 mcg Oral Daily  . Chlorhexidine Gluconate Cloth  6 each Topical Q0600  . FLUoxetine  10 mg Oral Daily  . insulin aspart  0-5 Units Subcutaneous QHS  . insulin aspart  0-9 Units Subcutaneous TID WC  . insulin detemir  15 Units Subcutaneous Daily  . ipratropium-albuterol  3 mL Nebulization TID  . levothyroxine  112 mcg Oral QAC breakfast  . midodrine  10 mg Oral TID WC  . montelukast  10 mg Oral QHS  . rosuvastatin  40 mg Oral Daily  . sevelamer carbonate  1,600 mg Oral TID WC  . sodium chloride flush  3 mL Intravenous Q12H    Continuous Infusions: . sodium chloride      PRN Meds: sodium chloride, acetaminophen **OR** acetaminophen, albuterol, clonazePAM, sodium chloride flush  Physical Exam Vitals signs and nursing note  reviewed.  Cardiovascular:     Rate and Rhythm: Normal rate.  Pulmonary:     Effort: Pulmonary effort is normal.  Skin:    General: Skin is warm and dry.  Neurological:     Mental Status: She is alert and oriented to person, place, and time.             Vital Signs: BP (!) 133/112 (BP Location: Left Arm)   Pulse 89   Temp 98.5 F (36.9 C) (Oral)   Resp 17   Ht 5' 1"  (1.549 m)   Wt 103.8 kg   SpO2 (!) 85%   BMI 43.24 kg/m  SpO2: SpO2: (!) 85 % O2 Device: O2 Device: Nasal Cannula O2 Flow Rate: O2 Flow Rate (L/min): 10 L/min  Intake/output summary:   Intake/Output Summary (Last 24 hours) at 09/27/2018 1256 Last data filed at 09/27/2018 0915 Gross per 24 hour  Intake 1326 ml  Output 200 ml  Net 1126 ml   LBM: Last BM Date: 09/25/18 Baseline Weight: Weight: 108.9 kg Most recent weight: Weight: 103.8 kg       Palliative Assessment/Data:      Patient Active Problem List   Diagnosis Date Noted  . Acute respiratory failure with hypoxia ([place]) 09/24/2018  . Advanced care planning/counseling discussion   . Goals of care, counseling/discussion   . Palliative care by specialist   . Acute on chronic respiratory failure with hypoxia ([place]) 09/15/2018  . Pulmonary edema 09/08/2018  . Respiratory failure ([place]) 09/08/2018  . Weakness   . SDH (subdural hematoma) ([place]) 09/06/2018  . Chronic respiratory failure with hypoxia, on home O2 therapy ([place]) 09/06/2018  . Subdural hematoma ([place]) 08/22/2018  . Elevated troponin 08/22/2018  . ESRD (end stage renal disease) ([place]) 08/22/2018  . Anxiety 07/04/2018  . Counseling regarding advanced directives and goals of care 07/01/2018  . Full code status 07/01/2018  . Drug-induced  constipation 07/01/2018  . Hemodialysis-associated hypotension 07/01/2018  . Dialysis patient ([place]) 07/01/2018  . HOH (hard of hearing) 07/01/2018  . Stage 5 chronic kidney disease on chronic dialysis ([place]) 07/01/2018  . Type 2 diabetes mellitus with diabetic  polyneuropathy, with long-term current use of insulin ([place]) 07/01/2018  . Hypothyroidism 07/01/2018  . Gait instability 07/01/2018  . Hyperkalemia 04/20/2018    Palliative Care Assessment & Plan   Patient Profile:  73 y.o. female  with past medical history of ESRD on HD, end stage COPD, OSA, cor pulmonale, chronic resp failure 6L, SDH with short term memory deficits, bed/chair bound, admitted on 09/23/2018 with weakness, diarrhea, found to be volume overloaded. Diarrhea resolving, respiratory status improving but she is requiring 10L [place]. Plan for repeat HD tomorrow to improve volume status. Palliative medicine consulted for [place] and for HCPOA.  Assessment/Recommendations/Plan   GOC are set- treat treatable, continue full scope care with limitations set at DNR, no intubation/mechanical ventilation, no tube feeding  Increase clonazepam to TID PRN  Will request social work to follow-up with with patient's son, Valeda Malm consult for [facility] paperwork  PMT will shadow and see again only if needed  Care manager referral for outpatient Palliative at discharge   Discharge Planning:  Home with [place] was discussed with patient, [name] and [name], and patient's RN  Thank you for allowing the Palliative Medicine Team to assist in the care of this patient.   Time In: 1100 Time Out: 1205 Total Time 65 minutes Prolonged Time Billed yes      Greater than 50%  of this time was spent counseling and coordinating care related to the above assessment and plan.  Mariana Kaufman, AGNP-C Palliative Medicine   Please contact Palliative Medicine Team phone at [phone] for questions and concerns.

## 2018-09-27 NOTE — Plan of Care (Signed)
  Problem: Education: Goal: Knowledge of General Education information will improve Description: Including pain rating scale, medication(s)/side effects and non-pharmacologic comfort measures Outcome: Progressing   Problem: Health Behavior/Discharge Planning: Goal: Ability to manage health-related needs will improve Outcome: Progressing   Problem: Clinical Measurements: Goal: Cardiovascular complication will be avoided Outcome: Progressing   Problem: Clinical Measurements: Goal: Will remain free from infection Outcome: Progressing   Problem: Activity: Goal: Risk for activity intolerance will decrease Outcome: Not Progressing   Problem: Nutrition: Goal: Adequate nutrition will be maintained Outcome: Progressing

## 2018-09-27 NOTE — Progress Notes (Signed)
CSW met with patient and family (2 sons) at bedside. They report they are requesting as much in home services as possible for when patient is discharged. They state patient has been having trouble paying for her medications, specifically insulin,  CSW has consulted RNCM regarding medication costs.   They report needing a ramp for patient to be able to use her scooter to go in and out of the home, they also report needing meals on wheels as patient as difficulty getting food.   CSW made referral to meals on wheels, patient is on their waitlist. [employer] resources is reviewing request for ramp to patient's home. CSW will also reach out to local churches. CSW followed up with social services that will be reaching out to patient to discuss medicaid and any at home services they can assist with.   [place], [place]

## 2018-09-27 NOTE — Progress Notes (Signed)
Inpatient Diabetes Program Recommendations  AACE/ADA: New Consensus Statement on Inpatient Glycemic Control   Target Ranges:  Prepandial:   less than 140 mg/dL      Peak postprandial:   less than 180 mg/dL (1-2 hours)      Critically ill patients:  140 - 180 mg/dL   Results for Joan Mosley, Joan Mosley (MRN [id number]) as of 09/27/2018 17:28  Ref. Range 09/26/2018 06:38 09/26/2018 14:20 09/26/2018 16:57 09/26/2018 21:27 09/27/2018 06:33 09/27/2018 11:42 09/27/2018 15:55  Glucose-Capillary Latest Ref Range: 70 - 99 mg/dL 134 (H) 152 (H) 138 (H) 153 (H) 140 (H) 120 (H) 208 (H)   Review of Glycemic Control  Diabetes history: DM2 Outpatient Diabetes medications: Levemir 15 units daily, Humalog 1-5 units TID with meals Current orders for Inpatient glycemic control: Levemir 15 units, Novolog 0-9 units TID with melas, Novolog 0-5 units QHS  Inpatient Diabetes Program Recommendations:   Outpatient DM insulin: CM paged about recommendations for more affordable insulin since patient does not have any prescription coverage. Based on current insulin orders and glycemic control as an inpatient, would recommend ordering Novolin 70/30 10 units BID (dose will provide a total of 14 units for basal and 6 units for meal coverage per day).  NOTE: Novolin 70/30 is an insulin that is $25 per vial or $43 per box of 5 insulin pens at [facility] which is much more affordable than patient's current insulin Levemir and Humalog.      Thanks, Barnie Alderman, RN, MSN, CDE Diabetes Coordinator Inpatient Diabetes Program [phone] (Team Pager from 8am to 5pm)

## 2018-09-27 NOTE — Progress Notes (Signed)
NCM spoke with Ryan [phone] and Eric [phone],  [name] states patient does not have medication coverage right now, it has lapsed and he is going to reinstate the insurance and he will let us know when that is done,  Also [name] will check on where patient was staying.  NCM contacted the diabetes coordinator, they will check on patient insulin tomorrow to see if they can get her to be on something less expensive.

## 2018-09-27 NOTE — Progress Notes (Signed)
Patient resting comfortably on 12L [place] high-flow nasal cannula with sats of 94%.  Bipap ordered QHS.  Decreased to 10L due to MD order for sat goal of 88%-92%.  Will continue to monitor.

## 2018-09-27 NOTE — Progress Notes (Signed)
Admit: 09/23/2018 LOS: 3  Joan Mosley ESRD TTS recent admission for weakness/falls hx/o SDH, FTT rec SNF but decided to go home, readmit with SOB, diarrhea, inc hypoxia  Subjective: " I want to go home!"  08/31 0701 - 09/01 0700 In: 1083 [P.O.:1080; I.V.:3] Out: 4400 [Urine:200]  Filed Weights   09/26/18 0725 09/26/18 1140 09/27/18 0500  Weight: 107 kg 103 kg 103.8 kg    Scheduled Meds: . arformoterol  15 mcg Nebulization BID  . budesonide (PULMICORT) nebulizer solution  0.25 mg Nebulization BID  . calcitRIOL  0.25 mcg Oral Daily  . Chlorhexidine Gluconate Cloth  6 each Topical Q0600  . FLUoxetine  10 mg Oral Daily  . insulin aspart  0-5 Units Subcutaneous QHS  . insulin aspart  0-9 Units Subcutaneous TID WC  . insulin detemir  15 Units Subcutaneous Daily  . ipratropium-albuterol  3 mL Nebulization TID  . levothyroxine  112 mcg Oral QAC breakfast  . midodrine  10 mg Oral TID WC  . montelukast  10 mg Oral QHS  . rosuvastatin  40 mg Oral Daily  . sevelamer carbonate  1,600 mg Oral TID WC  . sodium chloride flush  3 mL Intravenous Q12H   Continuous Infusions: . sodium chloride     PRN Meds:.sodium chloride, acetaminophen **OR** acetaminophen, albuterol, clonazePAM, sodium chloride flush  Current Labs: reviewed   e 10 mg PO TIW before HD and mid treatment for SBP <95  Physical Exam:  Blood pressure (!) 133/112, pulse 89, temperature 98.5 F (36.9 C), temperature source Oral, resp. rate 17, height 5\' 1"  (1.549 m), weight 103.8 kg, SpO2 93 %. Chronically ill appearing, no disterss, on HD Chest cta bilat Regular, nl s1s2 no murmur rub Abd soft ntnd obese 1-2+ Edema LE's NF, gen weakness  [facility] 4h 400/1.5 105.5kg 2/2 bath Hep none TDC Calcitriol 1.5 mcg PO qHD Sensipar 60 mg PO qHD renvela 800mg  3 tabs AC Midodrine  CXR - 8/29 > IMPRESSION: 1. Stable cardiomegaly with streaky basilar atelectasis 2. Similar appearance of diffuse bilateral fine reticular  interstitial opacity, suspected to be due to chronic interstitial disease.  Assessment: 1. ESRD TTS via TDC 2. Hypoxia - pulm edema, COPD. Better.  3. BP/volume - down 111 > 104kg w/ HD x 2, cont to lower as tol 4. Diarrhea-  resolved, transient 5. FTT 6. Recent SDH 7. DM2 8. MDD/SDH 9. Dispo - snf vs home, per primary  P - HD tomorrow off schedule   Kelly Splinter, MD 09/27/2018, 2:57 PM   Recent Labs  Lab 09/24/18 0429 09/25/18 0248 09/26/18 0822  NA 134* 133* 132*  K 5.5* 4.6 4.3  CL 91* 93* 91*  CO2 27 27 25   GLUCOSE 165* 203* 174*  BUN 59* 25* 53*  CREATININE 8.24* 4.09* 6.85*  CALCIUM 8.2* 8.8* 8.0*  PHOS  --   --  8.3*   Recent Labs  Lab 09/23/18 2320 09/24/18 0429 09/25/18 0248 09/26/18 0821  WBC 8.0 11.6* 8.7 11.9*  NEUTROABS 6.1  --   --   --   HGB 8.6* 7.8* 8.3* 8.5*  HCT 28.9* 25.3* 27.6* 28.1*  MCV 100.3* 99.6 99.3 100.4*  PLT 143* 148* 172 176

## 2018-09-27 NOTE — Progress Notes (Addendum)
PROGRESS NOTE   Joan Mosley  [id number]    DOB: Jan 25, 1946    DOA: 09/23/2018  PCP: Flossie Buffy, NP   I have briefly reviewed patients previous medical records in [facility].  Chief Complaint  Patient presents with  . Weakness  . Diarrhea    Brief Narrative:  73 year old female, lives with her family, nonambulatory/electric wheelchair mobile, PMH of end-stage COPD, chronic respiratory failure on home oxygen 5 L/min, ESRD on TTS HD, morbid obesity, DM 2 with renal complications, hypothyroid, HLD, hypotension, anxiety and depression, recent hospitalization 8/11-8/12 for subdural hematoma following fall, managed conservatively, declined SNF, 4 hospital admissions in the last 6 months, presented to [facility] ED via EMS on 8/28 due to generalized weakness, diarrhea and dyspnea.  She was admitted for acute on chronic hypoxic respiratory failure, advanced COPD, less likely an exacerbation, possible volume overload/pulmonary edema, diarrhea and hyperkalemia.  Seen by PCCM in ED.  Nephrology consulted for HD. PMT consulted for [place] discussion given FTT.  S/p HD with 4 L UF on 8/29.  Better but still remains on 10-12 L/min HFNC oxygen. Repeat HD on 8/31.  We have recommended to discharge her to SNF however she is declining that possibility.  Wean O2 to prior home level as tolerated prior to safe DC home. PMT consulted, DNR, continue HD and patient wants to return home and declines SNF.  Assessment & Plan:   Principal Problem:   Acute respiratory failure with hypoxia (HCC) Active Problems:   Type 2 diabetes mellitus with diabetic polyneuropathy, with long-term current use of insulin (HCC)   Hypothyroidism   ESRD (end stage renal disease) (HCC)   Acute on chronic respiratory failure with hypoxia/end-stage COPD/suspected OSA/interstitial lung disease  Suspect due to pulmonary edema complicating end-stage COPD and less likely due to COPD exacerbation as she has no wheezes on exam.   Volume management across HD, 4 L UF at HD on 8/29.  Completed brief course of IV steroids.  Titrate oxygen to maintain saturations between 88-92%.  Remains on 10-12 L/min HF [place] oxygen overnight and this am and unable to wean down yet.  Can try BiPAP nightly and PRN> but patient refused that also.  Brovana, Pulmicort and bronchodilator nebulizations.  Continue Singulair.  Repeat dialysis today for further volume Mx.  Diarrhea  Transient and resolved.  ESRD on TTS HD/volume overload   Reportedly missed her dialysis on Tuesday PTA.  Had dialysis on Thursday however.  Appears volume overloaded but better compared to on admission.  Nephrology input appreciated, HD treatment per nephrology.  Hyperkalemia  Resolved after HD.  Acute on chronic diastolic CHF  TTE 99991111: LVEF 60-65%.  Volume management across HD.  Management as indicated above.  Type II DM with hyperglycemia and renal complications.  Steroids discontinued likely helping  Continue Levemir 15 units daily and SSI.  Monitor CBGs closely and adjust insulins as needed. No change today.  Hypothyroid  Continue levothyroxine.  Hyperlipidemia  Continue Crestor.  Anxiety and depression  Continue fluoxetine and as needed clonazepam.  History of recent subdural hematoma  Managed conservatively.  No focal deficits.  Morbid obesity/Body mass index is 43.24 kg/m.  Anemia in ESRD  No bleeding reported.  At baseline hemoglobin appears to run in the 8 g range.  Hemoglobin stable low 8 g range.  Follow CBC at HD. and transfuse if hemoglobin 7 g or less.  Thrombocytopenia, mild  Resolved  Abnormal LFTs  Suspect due to passive hepatic congestion.  No GI  symptoms.  Improving.  Follow CMP, not done today, added.  Adult failure to thrive  Secondary to advanced age and multiple severe significant and many irreversible comorbidities.  Ongoing frequent rehospitalization's.  PMT did [place] with patient and son  on 8/30> new HCPOA (son), continue HD and hence not Hospice eligible, OP Palliative referral, DNR, declines SNF and wishes to return home which is going to be challenging as she is requiring high amount of oxygen.  DVT prophylaxis: SCD Code Status: DNR Family Communication: Discussed in detail with patient's son [name] and Aaron Edelman at the bedside.  Answered several questions.  They had a long meeting with palliative care today as well.  No family present during my visit today. Disposition: DC home pending clinical improvement and Nephrology clearance.   Consultants:  PCCM Nephrology  Procedures:  HD  Antimicrobials:  Azithromycin (course completed)   Subjective: Patient seen and examined.  Despite of being on 11 to 12 L of high flow oxygen, she denied having any shortness of breath.  Looked very comfortable.  She tells me that she uses 5 L of oxygen at home.  She was asking when she can go home.  Once again she declined SNF discharge.  Objective:  Vitals:   09/27/18 0807 09/27/18 0810 09/27/18 0813 09/27/18 0936  BP:    (!) 133/112  Pulse:    89  Resp:    17  Temp:    98.5 F (36.9 C)  TempSrc:    Oral  SpO2: 95% 95% 97% (!) 85%  Weight:      Height:        Examination: General exam: Appears calm and comfortable, morbidly obese Respiratory system: Diminished breath sounds,. Respiratory effort normal. Cardiovascular system: S1 & S2 heard, RRR. No JVD, murmurs, rubs, gallops or clicks.  Trace pitting edema bilateral lower extremity Gastrointestinal system: Abdomen is nondistended, soft and nontender. No organomegaly or masses felt. Normal bowel sounds heard. Central nervous system: Alert and oriented. No focal neurological deficits. Extremities: Symmetric 5 x 5 power. Skin: No rashes, lesions or ulcers Psychiatry: Judgement and insight appear normal. Mood & affect appropriate.   Data Reviewed: I have personally reviewed following labs and imaging studies  CBC: Recent Labs   Lab 09/23/18 2320 09/24/18 0429 09/25/18 0248 09/26/18 0821  WBC 8.0 11.6* 8.7 11.9*  NEUTROABS 6.1  --   --   --   HGB 8.6* 7.8* 8.3* 8.5*  HCT 28.9* 25.3* 27.6* 28.1*  MCV 100.3* 99.6 99.3 100.4*  PLT 143* 148* 172 0000000   Basic Metabolic Panel: Recent Labs  Lab 09/23/18 2320 09/24/18 0429 09/25/18 0248 09/26/18 0822  NA 135 134* 133* 132*  K 5.4* 5.5* 4.6 4.3  CL 90* 91* 93* 91*  CO2 25 27 27 25   GLUCOSE 207* 165* 203* 174*  BUN 54* 59* 25* 53*  CREATININE 7.87* 8.24* 4.09* 6.85*  CALCIUM 8.0* 8.2* 8.8* 8.0*  MG 2.6*  --   --   --   PHOS  --   --   --  8.3*   Liver Function Tests: Recent Labs  Lab 09/24/18 0429 09/25/18 0248 09/26/18 0822 09/26/18 1446  AST 235* 81*  --  27  ALT 182* 146*  --  89*  ALKPHOS 250* 219*  --  185*  BILITOT 0.9 0.7  --  0.6  PROT 6.2* 5.9*  --  6.0*  ALBUMIN 3.0* 2.9* 3.0* 3.0*    Cardiac Enzymes: No results for input(s): CKTOTAL, CKMB, CKMBINDEX, TROPONINI  in the last 168 hours.  CBG: Recent Labs  Lab 09/26/18 1420 09/26/18 1657 09/26/18 2127 09/27/18 0633 09/27/18 1142  GLUCAP 152* 138* 153* 140* 120*    Recent Results (from the past 240 hour(s))  SARS Coronavirus 2 [facility] order, Performed in [facility] hospital lab) Nasopharyngeal Nasopharyngeal Swab     Status: None   Collection Time: 09/24/18  1:08 AM   Specimen: Nasopharyngeal Swab  Result Value Ref Range Status   SARS Coronavirus 2 NEGATIVE NEGATIVE Final    Comment: (NOTE) If result is NEGATIVE SARS-CoV-2 target nucleic acids are NOT DETECTED. The SARS-CoV-2 RNA is generally detectable in upper and lower  respiratory specimens during the acute phase of infection. The lowest  concentration of SARS-CoV-2 viral copies this assay can detect is 250  copies / mL. A negative result does not preclude SARS-CoV-2 infection  and should not be used as the sole basis for treatment or other  patient management decisions.  A negative result may occur with  improper  specimen collection / handling, submission of specimen other  than nasopharyngeal swab, presence of viral mutation(s) within the  areas targeted by this assay, and inadequate number of viral copies  (<250 copies / mL). A negative result must be combined with clinical  observations, patient history, and epidemiological information. If result is POSITIVE SARS-CoV-2 target nucleic acids are DETECTED. The SARS-CoV-2 RNA is generally detectable in upper and lower  respiratory specimens dur ing the acute phase of infection.  Positive  results are indicative of active infection with SARS-CoV-2.  Clinical  correlation with patient history and other diagnostic information is  necessary to determine patient infection status.  Positive results do  not rule out bacterial infection or co-infection with other viruses. If result is PRESUMPTIVE POSTIVE SARS-CoV-2 nucleic acids MAY BE PRESENT.   A presumptive positive result was obtained on the submitted specimen  and confirmed on repeat testing.  While 2019 novel coronavirus  (SARS-CoV-2) nucleic acids may be present in the submitted sample  additional confirmatory testing may be necessary for epidemiological  and / or clinical management purposes  to differentiate between  SARS-CoV-2 and other Sarbecovirus currently known to infect humans.  If clinically indicated additional testing with an alternate test  methodology [phone]) is advised. The SARS-CoV-2 RNA is generally  detectable in upper and lower respiratory sp ecimens during the acute  phase of infection. The expected result is Negative. Fact Sheet for Patients:  StrictlyIdeas.no Fact Sheet for Healthcare Providers: BankingDealers.co.za This test is not yet approved or cleared by the [place] FDA and has been authorized for detection and/or diagnosis of SARS-CoV-2 by FDA under an Emergency Use Authorization (EUA).  This EUA will remain in  effect (meaning this test can be used) for the duration of the COVID-19 declaration under Section 564(b)(1) of the Act, 21 U.S.C. section 360bbb-3(b)(1), unless the authorization is terminated or revoked sooner. Performed at [facility], [place] [address], [place], [place] [address]          Radiology Studies: No results found.      Scheduled Meds: . arformoterol  15 mcg Nebulization BID  . budesonide (PULMICORT) nebulizer solution  0.25 mg Nebulization BID  . calcitRIOL  0.25 mcg Oral Daily  . Chlorhexidine Gluconate Cloth  6 each Topical Q0600  . FLUoxetine  10 mg Oral Daily  . insulin aspart  0-5 Units Subcutaneous QHS  . insulin aspart  0-9 Units Subcutaneous TID WC  . insulin detemir  15 Units Subcutaneous Daily  . ipratropium-albuterol  3 mL Nebulization TID  . levothyroxine  112 mcg Oral QAC breakfast  . midodrine  10 mg Oral TID WC  . montelukast  10 mg Oral QHS  . rosuvastatin  40 mg Oral Daily  . sevelamer carbonate  1,600 mg Oral TID WC  . sodium chloride flush  3 mL Intravenous Q12H   Continuous Infusions: . sodium chloride       LOS: 3 days     Darliss Cheney, MD, [facility]  To contact the attending provider between 7A-7P or the covering provider during after hours 7P-7A, please log into the web site [email or web address] and access using universal [place] password for that web site. If you do not have the password, please call the hospital operator.  09/27/2018, 1:45 PM

## 2018-09-28 LAB — CBC WITH DIFFERENTIAL/PLATELET
Abs Immature Granulocytes: 0.07 10*3/uL (ref 0.00–0.07)
Basophils Absolute: 0.1 10*3/uL (ref 0.0–0.1)
Basophils Relative: 1 %
Eosinophils Absolute: 0.2 10*3/uL (ref 0.0–0.5)
Eosinophils Relative: 2 %
HCT: 30.9 % — ABNORMAL LOW (ref 36.0–46.0)
Hemoglobin: 9.7 g/dL — ABNORMAL LOW (ref 12.0–15.0)
Immature Granulocytes: 1 %
Lymphocytes Relative: 14 %
Lymphs Abs: 1.7 10*3/uL (ref 0.7–4.0)
MCH: 29.9 pg (ref 26.0–34.0)
MCHC: 31.4 g/dL (ref 30.0–36.0)
MCV: 95.4 fL (ref 80.0–100.0)
Monocytes Absolute: 0.5 10*3/uL (ref 0.1–1.0)
Monocytes Relative: 4 %
Neutro Abs: 10.1 10*3/uL — ABNORMAL HIGH (ref 1.7–7.7)
Neutrophils Relative %: 78 %
Platelets: 154 10*3/uL (ref 150–400)
RBC: 3.24 MIL/uL — ABNORMAL LOW (ref 3.87–5.11)
RDW: 16.3 % — ABNORMAL HIGH (ref 11.5–15.5)
WBC: 12.6 10*3/uL — ABNORMAL HIGH (ref 4.0–10.5)
nRBC: 0 % (ref 0.0–0.2)

## 2018-09-28 LAB — COMPREHENSIVE METABOLIC PANEL
ALT: 46 U/L — ABNORMAL HIGH (ref 0–44)
AST: 13 U/L — ABNORMAL LOW (ref 15–41)
Albumin: 3.2 g/dL — ABNORMAL LOW (ref 3.5–5.0)
Alkaline Phosphatase: 164 U/L — ABNORMAL HIGH (ref 38–126)
Anion gap: 18 — ABNORMAL HIGH (ref 5–15)
BUN: 56 mg/dL — ABNORMAL HIGH (ref 8–23)
CO2: 22 mmol/L (ref 22–32)
Calcium: 9.1 mg/dL (ref 8.9–10.3)
Chloride: 90 mmol/L — ABNORMAL LOW (ref 98–111)
Creatinine, Ser: 6.9 mg/dL — ABNORMAL HIGH (ref 0.44–1.00)
GFR calc Af Amer: 6 mL/min — ABNORMAL LOW (ref >60)
GFR calc non Af Amer: 5 mL/min — ABNORMAL LOW (ref >60)
Glucose, Bld: 164 mg/dL — ABNORMAL HIGH (ref 70–99)
Potassium: 4.4 mmol/L (ref 3.5–5.1)
Sodium: 130 mmol/L — ABNORMAL LOW (ref 135–145)
Total Bilirubin: 0.8 mg/dL (ref 0.3–1.2)
Total Protein: 6.5 g/dL (ref 6.5–8.1)

## 2018-09-28 LAB — GLUCOSE, CAPILLARY
Glucose-Capillary: 125 mg/dL — ABNORMAL HIGH (ref 70–99)
Glucose-Capillary: 119 mg/dL — ABNORMAL HIGH (ref 70–99)
Glucose-Capillary: 92 mg/dL (ref 70–99)
Glucose-Capillary: 177 mg/dL — ABNORMAL HIGH (ref 70–99)

## 2018-09-28 LAB — MAGNESIUM: Magnesium: 2.4 mg/dL (ref 1.7–2.4)

## 2018-09-28 MED ORDER — CHLORHEXIDINE GLUCONATE CLOTH 2 % EX PADS: 6.0000 | MEDICATED_PAD | Freq: Every day | CUTANEOUS | Status: DC

## 2018-09-28 NOTE — Plan of Care (Signed)
Progressing

## 2018-09-28 NOTE — Progress Notes (Signed)
Admit: 09/23/2018 LOS: 4  81F ESRD TTS recent admission for weakness/falls hx/o SDH, FTT rec SNF but decided to go home, readmit with SOB, diarrhea, inc hypoxia  Subjective: Sitting up in chair  09/01 0701 - 09/02 0700 In: 483 [P.O.:480; I.V.:3] Out: -   Filed Weights   09/26/18 0725 09/26/18 1140 09/27/18 0500  Weight: 107 kg 103 kg 103.8 kg    Scheduled Meds: . arformoterol  15 mcg Nebulization BID  . budesonide (PULMICORT) nebulizer solution  0.25 mg Nebulization BID  . calcitRIOL  0.25 mcg Oral Daily  . Chlorhexidine Gluconate Cloth  6 each Topical Q0600  . Chlorhexidine Gluconate Cloth  6 each Topical Q0600  . FLUoxetine  10 mg Oral Daily  . insulin aspart  0-5 Units Subcutaneous QHS  . insulin aspart  0-9 Units Subcutaneous TID WC  . insulin detemir  15 Units Subcutaneous Daily  . ipratropium-albuterol  3 mL Nebulization TID  . levothyroxine  112 mcg Oral QAC breakfast  . midodrine  10 mg Oral TID WC  . montelukast  10 mg Oral QHS  . rosuvastatin  40 mg Oral Daily  . sevelamer carbonate  1,600 mg Oral TID WC  . sodium chloride flush  3 mL Intravenous Q12H   Continuous Infusions: . sodium chloride     PRN Meds:.sodium chloride, acetaminophen **OR** acetaminophen, albuterol, clonazePAM, sodium chloride flush  Current Labs: reviewed   e 10 mg PO TIW before HD and mid treatment for SBP <95  Physical Exam:  Blood pressure 140/74, pulse 70, temperature (!) 97.4 F (36.3 C), temperature source Oral, resp. rate 19, height 5\' 1"  (1.549 m), weight 103.8 kg, SpO2 100 %. Chronically ill appearing, no disterss, on HD Chest cta bilat Regular, nl s1s2 no murmur rub Abd soft ntnd obese 1-2+ Edema LE's NF, gen weakness  [facility] 4h 400/1.5 105.5kg 2/2 bath Hep none TDC Calcitriol 1.5 mcg PO qHD Sensipar 60 mg PO qHD renvela 800mg  3 tabs AC Midodrine  CXR - 8/29 > IMPRESSION: 1. Stable cardiomegaly with streaky basilar atelectasis 2. Similar appearance  of diffuse bilateral fine reticular interstitial opacity, suspected to be due to chronic interstitial disease.  Assessment: 1. ESRD TTS via TDC. HD off sched today then resume TTS w/ HD tomorrow.  2. Hypoxia - pulm edema, COPD. Sig volume removal w HD but still on HFNC at 11 lpm.  3. BP/volume - down 8kg here and under her prior dry wt, reduce dry wt on discharge 4. Diarrhea-  resolved, transient 5. FTT - appreciate palliative care assistance. Pt has very poor memory. Pt is DNR.  Overall prognosis is very poor.  Wants to continue dialysis for now.  6. Recent SDH 7. DM2 8. MDD/SDH 9. Dispo - is OK for dc home from renal standpoint  P - as above   Kelly Splinter, MD 09/28/2018, 11:40 AM   Recent Labs  Lab 09/25/18 0248 09/26/18 0822 09/28/18 0920  NA 133* 132* 130*  K 4.6 4.3 4.4  CL 93* 91* 90*  CO2 27 25 22   GLUCOSE 203* 174* 164*  BUN 25* 53* 56*  CREATININE 4.09* 6.85* 6.90*  CALCIUM 8.8* 8.0* 9.1  PHOS  --  8.3*  --    Recent Labs  Lab 09/23/18 2320  09/25/18 0248 09/26/18 0821 09/28/18 0920  WBC 8.0   < > 8.7 11.9* 12.6*  NEUTROABS 6.1  --   --   --  10.1*  HGB 8.6*   < > 8.3* 8.5*  9.7*  HCT 28.9*   < > 27.6* 28.1* 30.9*  MCV 100.3*   < > 99.3 100.4* 95.4  PLT 143*   < > 172 176 154   < > = values in this interval not displayed.

## 2018-09-28 NOTE — Progress Notes (Signed)
PROGRESS NOTE   Joan Mosley  [id number]    DOB: 07-09-1945    DOA: 09/23/2018  PCP: Flossie Buffy, NP   I have briefly reviewed patients previous medical records in [facility].  Chief Complaint  Patient presents with   Weakness   Diarrhea    Brief Narrative:  73 year old female, lives with her family, nonambulatory/electric wheelchair mobile, PMH of end-stage COPD, chronic respiratory failure on home oxygen 5 L/min, ESRD on TTS HD, morbid obesity, DM 2 with renal complications, hypothyroid, HLD, hypotension, anxiety and depression, recent hospitalization 8/11-8/12 for subdural hematoma following fall, managed conservatively, declined SNF, 4 hospital admissions in the last 6 months, presented to [facility] ED via EMS on 8/28 due to generalized weakness, diarrhea and dyspnea.  She was admitted for acute on chronic hypoxic respiratory failure, advanced COPD, less likely an exacerbation, possible volume overload/pulmonary edema, diarrhea and hyperkalemia.  Seen by PCCM in ED.  Nephrology consulted for HD. PMT consulted for [place] discussion given FTT.  S/p HD with 4 L UF on 8/29.  Better but still remains on 10-12 L/min HFNC oxygen. Repeat HD on 8/31.  We have recommended to discharge her to SNF however she is declining that possibility.  Wean O2 to prior home level as tolerated prior to safe DC home. PMT consulted, DNR, continue HD and patient wants to return home and declines SNF.  Assessment & Plan:   Principal Problem:   Acute respiratory failure with hypoxia (HCC) Active Problems:   Type 2 diabetes mellitus with diabetic polyneuropathy, with long-term current use of insulin (HCC)   Hypothyroidism   ESRD (end stage renal disease) (HCC)   Acute on chronic respiratory failure with hypoxia/end-stage COPD/suspected OSA/interstitial lung disease  Suspect due to pulmonary edema complicating end-stage COPD and possibly interstitial lung disease, and less likely due to COPD exacerbation  as she has no wheezes on exam.  Volume management across HD, 4 L UF at HD on 8/29.  Completed brief course of IV steroids.  Titrate oxygen to maintain saturations between 88-92%.  Remains on 10 L/min HF [place] oxygen overnight and this am and unable to wean down yet.  Can try BiPAP nightly and PRN> but patient refused that also.  Brovana, Pulmicort and bronchodilator nebulizations.  Continue Singulair.  Repeat dialysis today for further volume Mx.  Diarrhea  Transient and resolved.  ESRD on TTS HD/volume overload   Reportedly missed her dialysis on Tuesday PTA.  Had dialysis on Thursday however.  Appears volume overloaded but better compared to on admission.  Nephrology input appreciated, HD treatment per nephrology.  Hyperkalemia  Resolved after HD.  Acute on chronic diastolic CHF  TTE 99991111: LVEF 60-65%.  Volume management across HD.  Management as indicated above.  Type II DM with hyperglycemia and renal complications.  Steroids discontinued likely helping  Blood sugar controlled.  Continue Levemir 15 units daily and SSI.    Hypothyroid  Continue levothyroxine.  Hyperlipidemia  Continue Crestor.  Anxiety and depression  Continue fluoxetine and as needed clonazepam.  History of recent subdural hematoma  Managed conservatively.  No focal deficits.  Morbid obesity/Body mass index is 43.24 kg/m.  Anemia in ESRD  No bleeding reported.  At baseline hemoglobin appears to run in the 8 g range.  Hemoglobin stable low 8 g range.  Follow CBC at HD. and transfuse if hemoglobin 7 g or less.  Thrombocytopenia, mild  Resolved  Abnormal LFTs  Suspect due to passive hepatic congestion.  No GI symptoms.  Improving.  Follow CMP, not done today, added.  Adult failure to thrive  Secondary to advanced age and multiple severe significant and many irreversible comorbidities.  Ongoing frequent rehospitalization's.  PMT did [place] with patient and son on 8/30>  new HCPOA (son [name]), continue HD and hence not Hospice eligible, OP Palliative referral, DNR, declines SNF and wishes to return home which is going to be challenging as she is requiring high amount of oxygen.  DVT prophylaxis: SCD Code Status: DNR Family Communication: Discussed in detail with patient's son [name] and Aaron Edelman at the bedside on 09/29/2018.  Answered several questions.  They had a long meeting with palliative care today as well.  No family present during my visit today. Disposition: DC home pending clinical improvement and Nephrology clearance.  Consultants:  PCCM Nephrology  Procedures:  HD  Antimicrobials:  Azithromycin (course completed)   Subjective: Patient seen and examined.  Despite of being on 10 L of high flow oxygen, she denied any shortness of breath and looked comfortable.  Patient has tendency to forget things and despite of extended discussion with palliative care and with me yesterday, she once again asked me if she is going home today.  Objective:  Vitals:   09/27/18 2356 09/28/18 0402 09/28/18 0739 09/28/18 0810  BP: (!) 160/93 (!) 146/86  (!) 148/89  Pulse: 83 76 81 83  Resp: (!) 21 (!) 21 (!) 21 16  Temp: 98 F (36.7 C) 97.6 F (36.4 C)  (!) 97.3 F (36.3 C)  TempSrc: Oral Oral  Oral  SpO2: 92% 92% 91% 93%  Weight:      Height:        Examination:  General exam: Appears calm and comfortable, morbidly obese Respiratory system: Crackles at the bases bilaterally. Respiratory effort normal. Cardiovascular system: S1 & S2 heard, RRR. No JVD, murmurs, rubs, gallops or clicks. No pedal edema. Gastrointestinal system: Abdomen is nondistended, soft and nontender. No organomegaly or masses felt. Normal bowel sounds heard. Central nervous system: Alert and oriented. No focal neurological deficits. Extremities: Symmetric 5 x 5 power. Skin: No rashes, lesions or ulcers Psychiatry: Judgement and insight appear poor, mood & affect appropriate.    Data  Reviewed: I have personally reviewed following labs and imaging studies  CBC: Recent Labs  Lab 09/23/18 2320 09/24/18 0429 09/25/18 0248 09/26/18 0821 09/28/18 0920  WBC 8.0 11.6* 8.7 11.9* 12.6*  NEUTROABS 6.1  --   --   --  10.1*  HGB 8.6* 7.8* 8.3* 8.5* 9.7*  HCT 28.9* 25.3* 27.6* 28.1* 30.9*  MCV 100.3* 99.6 99.3 100.4* 95.4  PLT 143* 148* 172 176 [id number]   Basic Metabolic Panel: Recent Labs  Lab 09/23/18 2320 09/24/18 0429 09/25/18 0248 09/26/18 0822 09/28/18 0920  NA 135 134* 133* 132* 130*  K 5.4* 5.5* 4.6 4.3 4.4  CL 90* 91* 93* 91* 90*  CO2 25 27 27 25 22   GLUCOSE 207* 165* 203* 174* 164*  BUN 54* 59* 25* 53* 56*  CREATININE 7.87* 8.24* 4.09* 6.85* 6.90*  CALCIUM 8.0* 8.2* 8.8* 8.0* 9.1  MG 2.6*  --   --   --  2.4  PHOS  --   --   --  8.3*  --    Liver Function Tests: Recent Labs  Lab 09/24/18 0429 09/25/18 0248 09/26/18 0822 09/26/18 1446 09/28/18 0920  AST 235* 81*  --  27 13*  ALT 182* 146*  --  89* 46*  ALKPHOS 250* 219*  --  185* 164*  BILITOT 0.9 0.7  --  0.6 0.8  PROT 6.2* 5.9*  --  6.0* 6.5  ALBUMIN 3.0* 2.9* 3.0* 3.0* 3.2*    Cardiac Enzymes: No results for input(s): CKTOTAL, CKMB, CKMBINDEX, TROPONINI in the last 168 hours.  CBG: Recent Labs  Lab 09/27/18 0633 09/27/18 1142 09/27/18 1555 09/27/18 2108 09/28/18 0603  GLUCAP 140* 120* 208* 95 125*    Recent Results (from the past 240 hour(s))  SARS Coronavirus 2 [facility] order, Performed in [facility] hospital lab) Nasopharyngeal Nasopharyngeal Swab     Status: None   Collection Time: 09/24/18  1:08 AM   Specimen: Nasopharyngeal Swab  Result Value Ref Range Status   SARS Coronavirus 2 NEGATIVE NEGATIVE Final    Comment: (NOTE) If result is NEGATIVE SARS-CoV-2 target nucleic acids are NOT DETECTED. The SARS-CoV-2 RNA is generally detectable in upper and lower  respiratory specimens during the acute phase of infection. The lowest  concentration of SARS-CoV-2 viral copies this  assay can detect is 250  copies / mL. A negative result does not preclude SARS-CoV-2 infection  and should not be used as the sole basis for treatment or other  patient management decisions.  A negative result may occur with  improper specimen collection / handling, submission of specimen other  than nasopharyngeal swab, presence of viral mutation(s) within the  areas targeted by this assay, and inadequate number of viral copies  (<250 copies / mL). A negative result must be combined with clinical  observations, patient history, and epidemiological information. If result is POSITIVE SARS-CoV-2 target nucleic acids are DETECTED. The SARS-CoV-2 RNA is generally detectable in upper and lower  respiratory specimens dur ing the acute phase of infection.  Positive  results are indicative of active infection with SARS-CoV-2.  Clinical  correlation with patient history and other diagnostic information is  necessary to determine patient infection status.  Positive results do  not rule out bacterial infection or co-infection with other viruses. If result is PRESUMPTIVE POSTIVE SARS-CoV-2 nucleic acids MAY BE PRESENT.   A presumptive positive result was obtained on the submitted specimen  and confirmed on repeat testing.  While 2019 novel coronavirus  (SARS-CoV-2) nucleic acids may be present in the submitted sample  additional confirmatory testing may be necessary for epidemiological  and / or clinical management purposes  to differentiate between  SARS-CoV-2 and other Sarbecovirus currently known to infect humans.  If clinically indicated additional testing with an alternate test  methodology [phone]) is advised. The SARS-CoV-2 RNA is generally  detectable in upper and lower respiratory sp ecimens during the acute  phase of infection. The expected result is Negative. Fact Sheet for Patients:  StrictlyIdeas.no Fact Sheet for Healthcare  Providers: BankingDealers.co.za This test is not yet approved or cleared by the [place] FDA and has been authorized for detection and/or diagnosis of SARS-CoV-2 by FDA under an Emergency Use Authorization (EUA).  This EUA will remain in effect (meaning this test can be used) for the duration of the COVID-19 declaration under Section 564(b)(1) of the Act, 21 U.S.C. section 360bbb-3(b)(1), unless the authorization is terminated or revoked sooner. Performed at [facility], [place] [address] [place], [place] [address]      Radiology Studies: No results found.  Scheduled Meds:  arformoterol  15 mcg Nebulization BID   budesonide (PULMICORT) nebulizer solution  0.25 mg Nebulization BID   calcitRIOL  0.25 mcg Oral Daily   Chlorhexidine Gluconate Cloth  6 each Topical Q0600   Chlorhexidine  Gluconate Cloth  6 each Topical Q0600   FLUoxetine  10 mg Oral Daily   insulin aspart  0-5 Units Subcutaneous QHS   insulin aspart  0-9 Units Subcutaneous TID WC   insulin detemir  15 Units Subcutaneous Daily   ipratropium-albuterol  3 mL Nebulization TID   levothyroxine  112 mcg Oral QAC breakfast   midodrine  10 mg Oral TID WC   montelukast  10 mg Oral QHS   rosuvastatin  40 mg Oral Daily   sevelamer carbonate  1,600 mg Oral TID WC   sodium chloride flush  3 mL Intravenous Q12H   Continuous Infusions:  sodium chloride       LOS: 4 days    Darliss Cheney, MD, [facility]  To contact the attending provider between 7A-7P or the covering provider during after hours 7P-7A, please log into the web site [email or web address] and access using universal [place] password for that web site. If you do not have the password, please call the hospital operator.  09/28/2018, 10:23 AM

## 2018-09-28 NOTE — Care Management Important Message (Signed)
Important Message  Patient Details  Name: Joan Mosley MRN: [id number] Date of Birth: 1945-02-26   Medicare Important Message Given:  Yes     Memory Argue 09/28/2018, 3:23 PM

## 2018-09-29 LAB — RENAL FUNCTION PANEL
Albumin: 3.5 g/dL (ref 3.5–5.0)
Anion gap: 17 — ABNORMAL HIGH (ref 5–15)
BUN: 30 mg/dL — ABNORMAL HIGH (ref 8–23)
CO2: 23 mmol/L (ref 22–32)
Calcium: 9 mg/dL (ref 8.9–10.3)
Chloride: 93 mmol/L — ABNORMAL LOW (ref 98–111)
Creatinine, Ser: 5.19 mg/dL — ABNORMAL HIGH (ref 0.44–1.00)
GFR calc Af Amer: 9 mL/min — ABNORMAL LOW (ref >60)
GFR calc non Af Amer: 8 mL/min — ABNORMAL LOW (ref >60)
Glucose, Bld: 228 mg/dL — ABNORMAL HIGH (ref 70–99)
Phosphorus: 4.9 mg/dL — ABNORMAL HIGH (ref 2.5–4.6)
Potassium: 3.8 mmol/L (ref 3.5–5.1)
Sodium: 133 mmol/L — ABNORMAL LOW (ref 135–145)

## 2018-09-29 LAB — GLUCOSE, CAPILLARY
Glucose-Capillary: 160 mg/dL — ABNORMAL HIGH (ref 70–99)
Glucose-Capillary: 136 mg/dL — ABNORMAL HIGH (ref 70–99)
Glucose-Capillary: 155 mg/dL — ABNORMAL HIGH (ref 70–99)
Glucose-Capillary: 130 mg/dL — ABNORMAL HIGH (ref 70–99)

## 2018-09-29 LAB — CBC
HCT: 32.1 % — ABNORMAL LOW (ref 36.0–46.0)
Hemoglobin: 10.1 g/dL — ABNORMAL LOW (ref 12.0–15.0)
MCH: 29.9 pg (ref 26.0–34.0)
MCHC: 31.5 g/dL (ref 30.0–36.0)
MCV: 95 fL (ref 80.0–100.0)
Platelets: 138 10*3/uL — ABNORMAL LOW (ref 150–400)
RBC: 3.38 MIL/uL — ABNORMAL LOW (ref 3.87–5.11)
RDW: 16.5 % — ABNORMAL HIGH (ref 11.5–15.5)
WBC: 13.5 10*3/uL — ABNORMAL HIGH (ref 4.0–10.5)
nRBC: 0 % (ref 0.0–0.2)

## 2018-09-29 MED ORDER — CALCITRIOL 0.25 MCG PO CAPS
ORAL_CAPSULE | ORAL | Status: AC
  Filled 2018-09-29: qty 1

## 2018-09-29 MED ORDER — CLONAZEPAM 0.5 MG PO TABS
ORAL_TABLET | ORAL | Status: AC
  Filled 2018-09-29: qty 2

## 2018-09-29 MED ORDER — MIDODRINE HCL 5 MG PO TABS
ORAL_TABLET | ORAL | Status: AC
  Filled 2018-09-29: qty 2

## 2018-09-29 NOTE — Progress Notes (Signed)
Renal Navigator received call from CM/D. Lovena Le regarding patient's O2 need. She states [facility] can provide OP HD clinic[facility] with O2 concentrator that goes up to 10L so that patient can get the 6L of O2 that she needs. Clinic's current machine only reads to 5L.  Renal Navigator called [place] clinic and spoke with Charge RN/Daniella who states this will not be a problem and that it can be placed in a secluded area just for patient's use each time she is at the clinic for HD.  Alphonzo Cruise, [place] Renal Navigator [phone]

## 2018-09-29 NOTE — Progress Notes (Signed)
SATURATION QUALIFICATIONS: (This note is used to comply with regulatory documentation for home oxygen)  Patient Saturations on Room Air at Rest = 84%  Patient Saturations on Room Air while Ambulating = 79% just two steps and oxygen sats dropped  Patient Saturations on 6 Liters of oxygen while Ambulating = 88%  Please briefly explain why patient needs home oxygen:  Patient saturations increased to 91% on 6L/min.

## 2018-09-29 NOTE — Progress Notes (Signed)
PROGRESS NOTE   Joan Mosley  [id number]    DOB: Jun 05, 1945    DOA: 09/23/2018  PCP: Flossie Buffy, NP   I have briefly reviewed patients previous medical records in [facility].  Chief Complaint  Patient presents with  . Weakness  . Diarrhea    Brief Narrative:  73 year old female, lives with her family, nonambulatory/electric wheelchair mobile, PMH of end-stage COPD, chronic respiratory failure on home oxygen 5 L/min, ESRD on TTS HD, morbid obesity, DM 2 with renal complications, hypothyroid, HLD, hypotension, anxiety and depression, recent hospitalization 8/11-8/12 for subdural hematoma following fall, managed conservatively, declined SNF, 4 hospital admissions in the last 6 months, presented to [facility] ED via EMS on 8/28 due to generalized weakness, diarrhea and dyspnea.  She was admitted for acute on chronic hypoxic respiratory failure, advanced COPD, less likely an exacerbation, possible volume overload/pulmonary edema, diarrhea and hyperkalemia.  Seen by PCCM in ED.  Nephrology consulted for HD. PMT consulted for [place] discussion given FTT.  S/p HD with 4 L UF on 8/29.  Better but still remains on 10-12 L/min HFNC oxygen. Repeat HD on 8/31.  We have recommended to discharge her to SNF however she is declining that possibility.  Wean O2 to prior home level as tolerated prior to safe DC home. PMT consulted, DNR, continue HD and patient wants to return home and declines SNF.  Assessment & Plan:   Principal Problem:   Acute respiratory failure with hypoxia (HCC) Active Problems:   Type 2 diabetes mellitus with diabetic polyneuropathy, with long-term current use of insulin (HCC)   Hypothyroidism   ESRD (end stage renal disease) (HCC)   Acute on chronic respiratory failure with hypoxia/end-stage COPD/suspected OSA/interstitial lung disease  Suspect due to pulmonary edema complicating end-stage COPD and possibly interstitial lung disease, and less likely due to COPD exacerbation  as she has no wheezes on exam.  Volume management across HD, 4 L UF at HD on 8/29.  Completed brief course of IV steroids.  Titrate oxygen to maintain saturations between 88-92%.  She was on 10 L high flow oxygen in dialysis unit.  We reduced it to 7 L regular nasal cannula.  Can try BiPAP nightly and PRN> but patient refused that also.  Brovana, Pulmicort and bronchodilator nebulizations.  Continue Singulair.  Repeat dialysis today for further volume Mx.  Diarrhea  Transient and resolved.  ESRD on TTS HD/volume overload   Reportedly missed her dialysis on Tuesday PTA.  Had dialysis on Thursday however.  Appears volume overloaded but better compared to on admission.  Nephrology input appreciated, HD treatment per nephrology.  Hyperkalemia  Resolved after HD.  Acute on chronic diastolic CHF  TTE 99991111: LVEF 60-65%.  Volume management across HD.  Management as indicated above.  Type II DM with hyperglycemia and renal complications.  Blood sugar controlled.  Continue Levemir 15 units daily and SSI.    Hypothyroid  Continue levothyroxine.  Hyperlipidemia  Continue Crestor.  Anxiety and depression  Continue fluoxetine and as needed clonazepam.  History of recent subdural hematoma  Managed conservatively.  No focal deficits.  Morbid obesity/Body mass index is 42.32 kg/m.  Anemia in ESRD  No bleeding reported.  At baseline hemoglobin appears to run in the 8 g range.  Hemoglobin stable low 8 g range.  Follow CBC at HD. and transfuse if hemoglobin 7 g or less.  Thrombocytopenia, mild  Resolved  Abnormal LFTs  Suspect due to passive hepatic congestion.  No GI symptoms.  Improving.  Follow CMP, not done today, added.  Adult failure to thrive  Secondary to advanced age and multiple severe significant and many irreversible comorbidities.  Ongoing frequent rehospitalization's.  PMT did [place] with patient and son on 8/30> new HCPOA (son Joan Mosley), continue  HD and hence not Hospice eligible, OP Palliative referral, DNR, declines SNF and wishes to return home which is going to be challenging as she is requiring high amount of oxygen.  DVT prophylaxis: SCD Code Status: DNR Family Communication: Discussed in detail with patient's son again today.  They have figured out the home situation and want the patient to return home when stable. Disposition: DC home pending clinical improvement.  Expect discharge tomorrow.  Consultants:  PCCM Nephrology  Procedures:  HD  Antimicrobials:  Azithromycin (course completed)   Subjective: Patient seen and examined in dialysis unit.  She had no complaint.  Denied any shortness of breath or chest pain.  She was on 10 L of high flow oxygen.  Saturating 97%.  Reduced her to 7 L nasal cannula.  Objective:  Vitals:   09/29/18 1000 09/29/18 1030 09/29/18 1059 09/29/18 1230  BP: 127/66 (!) 145/76 (!) 120/55 117/74  Pulse: 75 82 97 95  Resp:   (!) 25 (!) 23  Temp:   98.3 F (36.8 C) 98.2 F (36.8 C)  TempSrc:   Oral Oral  SpO2:   95% 90%  Weight:   101.6 kg   Height:        Examination:  General exam: Appears calm and comfortable, morbidly obese Respiratory system: Diminished breath sounds at the bases. Respiratory effort normal. Cardiovascular system: S1 & S2 heard, RRR. No JVD, murmurs, rubs, gallops or clicks. No pedal edema. Gastrointestinal system: Abdomen is nondistended, soft and nontender. No organomegaly or masses felt. Normal bowel sounds heard. Central nervous system: Alert and oriented. No focal neurological deficits. Extremities: Symmetric 5 x 5 power. Skin: No rashes, lesions or ulcers Psychiatry: Judgement and insight appear poor. Mood & affect appropriate.    Data Reviewed: I have personally reviewed following labs and imaging studies  CBC: Recent Labs  Lab 09/23/18 2320 09/24/18 0429 09/25/18 0248 09/26/18 0821 09/28/18 0920 09/29/18 0758  WBC 8.0 11.6* 8.7 11.9* 12.6*  13.5*  NEUTROABS 6.1  --   --   --  10.1*  --   HGB 8.6* 7.8* 8.3* 8.5* 9.7* 10.1*  HCT 28.9* 25.3* 27.6* 28.1* 30.9* 32.1*  MCV 100.3* 99.6 99.3 100.4* 95.4 95.0  PLT 143* 148* 172 176 154 0000000*   Basic Metabolic Panel: Recent Labs  Lab 09/23/18 2320 09/24/18 0429 09/25/18 0248 09/26/18 0822 09/28/18 0920 09/29/18 0758  NA 135 134* 133* 132* 130* 133*  K 5.4* 5.5* 4.6 4.3 4.4 3.8  CL 90* 91* 93* 91* 90* 93*  CO2 25 27 27 25 22 23   GLUCOSE 207* 165* 203* 174* 164* 228*  BUN 54* 59* 25* 53* 56* 30*  CREATININE 7.87* 8.24* 4.09* 6.85* 6.90* 5.19*  CALCIUM 8.0* 8.2* 8.8* 8.0* 9.1 9.0  MG 2.6*  --   --   --  2.4  --   PHOS  --   --   --  8.3*  --  4.9*   Liver Function Tests: Recent Labs  Lab 09/24/18 0429 09/25/18 0248 09/26/18 0822 09/26/18 1446 09/28/18 0920 09/29/18 0758  AST 235* 81*  --  27 13*  --   ALT 182* 146*  --  89* 46*  --   ALKPHOS 250* 219*  --  185* 164*  --   BILITOT 0.9 0.7  --  0.6 0.8  --   PROT 6.2* 5.9*  --  6.0* 6.5  --   ALBUMIN 3.0* 2.9* 3.0* 3.0* 3.2* 3.5    Cardiac Enzymes: No results for input(s): CKTOTAL, CKMB, CKMBINDEX, TROPONINI in the last 168 hours.  CBG: Recent Labs  Lab 09/28/18 1121 09/28/18 1703 09/28/18 2057 09/29/18 0622 09/29/18 1128  GLUCAP 119* 92 177* 160* 136*    Recent Results (from the past 240 hour(s))  SARS Coronavirus 2 [facility] order, Performed in [facility] hospital lab) Nasopharyngeal Nasopharyngeal Swab     Status: None   Collection Time: 09/24/18  1:08 AM   Specimen: Nasopharyngeal Swab  Result Value Ref Range Status   SARS Coronavirus 2 NEGATIVE NEGATIVE Final    Comment: (NOTE) If result is NEGATIVE SARS-CoV-2 target nucleic acids are NOT DETECTED. The SARS-CoV-2 RNA is generally detectable in upper and lower  respiratory specimens during the acute phase of infection. The lowest  concentration of SARS-CoV-2 viral copies this assay can detect is 250  copies / mL. A negative result does not  preclude SARS-CoV-2 infection  and should not be used as the sole basis for treatment or other  patient management decisions.  A negative result may occur with  improper specimen collection / handling, submission of specimen other  than nasopharyngeal swab, presence of viral mutation(s) within the  areas targeted by this assay, and inadequate number of viral copies  (<250 copies / mL). A negative result must be combined with clinical  observations, patient history, and epidemiological information. If result is POSITIVE SARS-CoV-2 target nucleic acids are DETECTED. The SARS-CoV-2 RNA is generally detectable in upper and lower  respiratory specimens dur ing the acute phase of infection.  Positive  results are indicative of active infection with SARS-CoV-2.  Clinical  correlation with patient history and other diagnostic information is  necessary to determine patient infection status.  Positive results do  not rule out bacterial infection or co-infection with other viruses. If result is PRESUMPTIVE POSTIVE SARS-CoV-2 nucleic acids MAY BE PRESENT.   A presumptive positive result was obtained on the submitted specimen  and confirmed on repeat testing.  While 2019 novel coronavirus  (SARS-CoV-2) nucleic acids may be present in the submitted sample  additional confirmatory testing may be necessary for epidemiological  and / or clinical management purposes  to differentiate between  SARS-CoV-2 and other Sarbecovirus currently known to infect humans.  If clinically indicated additional testing with an alternate test  methodology [phone]) is advised. The SARS-CoV-2 RNA is generally  detectable in upper and lower respiratory sp ecimens during the acute  phase of infection. The expected result is Negative. Fact Sheet for Patients:  StrictlyIdeas.no Fact Sheet for Healthcare Providers: BankingDealers.co.za This test is not yet approved or cleared by  the [place] FDA and has been authorized for detection and/or diagnosis of SARS-CoV-2 by FDA under an Emergency Use Authorization (EUA).  This EUA will remain in effect (meaning this test can be used) for the duration of the COVID-19 declaration under Section 564(b)(1) of the Act, 21 U.S.C. section 360bbb-3(b)(1), unless the authorization is terminated or revoked sooner. Performed at [facility], [place] [address] [place], [place] [address]      Radiology Studies: No results found.  Scheduled Meds: . arformoterol  15 mcg Nebulization BID  . budesonide (PULMICORT) nebulizer solution  0.25 mg Nebulization BID  . calcitRIOL  0.25 mcg Oral Daily  .  Chlorhexidine Gluconate Cloth  6 each Topical Q0600  . FLUoxetine  10 mg Oral Daily  . insulin aspart  0-5 Units Subcutaneous QHS  . insulin aspart  0-9 Units Subcutaneous TID WC  . insulin detemir  15 Units Subcutaneous Daily  . ipratropium-albuterol  3 mL Nebulization TID  . levothyroxine  112 mcg Oral QAC breakfast  . midodrine  10 mg Oral TID WC  . montelukast  10 mg Oral QHS  . rosuvastatin  40 mg Oral Daily  . sevelamer carbonate  1,600 mg Oral TID WC  . sodium chloride flush  3 mL Intravenous Q12H   Continuous Infusions: . sodium chloride       LOS: 5 days    Darliss Cheney, MD, [facility]  To contact the attending provider between 7A-7P or the covering provider during after hours 7P-7A, please log into the web site [email or web address] and access using universal [place] password for that web site. If you do not have the password, please call the hospital operator.  09/29/2018, 12:39 PM

## 2018-09-29 NOTE — Progress Notes (Signed)
Patient's son asked to speak with CSW. CSW spoke with son, Joan Mosley, at bedside. He had questions about the [place] assisting the patient with services. CSW provided him with their contact info since it is the patient's deceased husband who had benefits. He requested CSW contact his brother, Joan Mosley as well. CSW spoke with Joan Mosley over the phone. He stated he just wanted an update. CSW went over the discharge plan with him as coordinated by the [facility]. He reported understanding and thanked CSW for the hospital's efforts. He stated he is comfortable with the patient discharging back home with his brother and sister though there are varying family dynamics.   Joan Mosley Joan Nodine LCSW [phone]

## 2018-09-29 NOTE — TOC Transition Note (Addendum)
Transition of Care [facility]) - CM/SW Discharge Note   Patient Details  Name: Joan Mosley MRN: [id number] Date of Birth: 1945/10/18  Transition of Care [facility]) CM/SW Contact:  Zenon Mayo, RN Phone Number: 09/29/2018, 4:06 PM   Clinical Narrative:    Patient for dc tomorrow, she will need ptar transport, address confirmed with [name],  She is set up with [facility] for [facility], [place], [place] and [place].  MD to put orders in,  Also she will be on new oxygen flow 6 to 8 liters, she already has oxygen with [facility], NCM informed MD will need new oxygen order and Arbie Cookey RN to get room air at rest sat.  Also NCM spoke with [facility] and they state they can take a concentrator to [facility] for patient to use while getting dialysis.  They will contact them tomorrow before they take it out there, they have done this for patient's in the past.   NCM received message from Donavan Foil the director at [employer] they can not bring the concentrator, but the charge RN state they can.  This information was given to [facility] and they state they will call and take care of this tomorrow, because they do they for other patients. Also NCM spoke with [name] about palliative services and he states they do not want palliative services at this time.  Also [name]  will call the [place] and give them patient's spouse information to see if she has any VA benefits.   9/4 - NCM spoke with Jaclyn Shaggy the renal navigator, she has been working with me to try to get patient's oxygen set up at the HD center , [facility] [phone].  They state they can not use somone else's equipment and be responsible if something happens to that equipment.  They will have to order their own for patient to use.  The Manager has left for the day at [facility],  NCM spoke with Sam and she spoke with the clinical services and they state it will be one to two weeks before they will get the oxygen machine in .  NCM spoke with Jaclyn Shaggy to check to see if  there is any way that patient can come here to get HD then go home until they get the machine at [facility], she will check into.   Final next level of care: [place] Barriers to Discharge: No Barriers Identified   Patient Goals and CMS Choice Patient states their goals for this hospitalization and ongoing recovery are:: get better CMS Medicare.gov Compare Post Acute Care list provided to:: Patient Represenative (must comment)(Eric Pandora Leiter) Choice offered to / list presented to : Adult Children  Discharge Placement                       Discharge Plan and Services                DME Arranged: Oxygen DME Agency: [facility] Date DME Agency Contacted: 09/29/18 Time DME Agency Contacted: [phone] Representative spoke with at DME Agency: [facility] rep [place] Arranged: RN, Disease Management, PT, OT, Nurse's Aide [place] Agency: [place] Date [place]: 09/29/18 Time [place]: [place] Representative spoke with at [place]: [place] ([place]) Interventions     Readmission Risk Interventions No flowsheet data found.

## 2018-09-29 NOTE — Progress Notes (Signed)
Admit: 09/23/2018 LOS: 5  59F ESRD TTS recent admission for weakness/falls hx/o SDH, FTT rec SNF but decided to go home, readmit with SOB, diarrhea, inc hypoxia  Subjective: seen on HD, no c/o's  09/02 0701 - 09/03 0700 In: 960 [P.O.:960] Out: 3000   Filed Weights   09/29/18 0510 09/29/18 0800 09/29/18 1059  Weight: 99.7 kg 102.6 kg 101.6 kg    Scheduled Meds: . calcitRIOL      . midodrine      . arformoterol  15 mcg Nebulization BID  . budesonide (PULMICORT) nebulizer solution  0.25 mg Nebulization BID  . calcitRIOL  0.25 mcg Oral Daily  . Chlorhexidine Gluconate Cloth  6 each Topical Q0600  . clonazePAM      . FLUoxetine  10 mg Oral Daily  . insulin aspart  0-5 Units Subcutaneous QHS  . insulin aspart  0-9 Units Subcutaneous TID WC  . insulin detemir  15 Units Subcutaneous Daily  . ipratropium-albuterol  3 mL Nebulization TID  . levothyroxine  112 mcg Oral QAC breakfast  . midodrine  10 mg Oral TID WC  . montelukast  10 mg Oral QHS  . rosuvastatin  40 mg Oral Daily  . sevelamer carbonate  1,600 mg Oral TID WC  . sodium chloride flush  3 mL Intravenous Q12H   Continuous Infusions: . sodium chloride     PRN Meds:.sodium chloride, acetaminophen **OR** acetaminophen, albuterol, clonazePAM, sodium chloride flush  Current Labs: reviewed   e 10 mg PO TIW before HD and mid treatment for SBP <95  Physical Exam:  Blood pressure (!) 120/55, pulse 97, temperature 98.3 F (36.8 C), temperature source Oral, resp. rate (!) 25, height 5\' 1"  (1.549 m), weight 101.6 kg, SpO2 95 %. Chronically ill appearing, no disterss, on HD Chest cta bilat Regular, nl s1s2 no murmur rub Abd soft ntnd obese 1-2+ Edema LE's NF, gen weakness  Dialysis:[facility] 4h 400/1.5 105.5kg 2/2 bath Hep none TDC Calcitriol 1.5 mcg PO qHD Sensipar 60 mg PO qHD renvela 800mg  3 tabs AC Midodrine  CXR - 8/29 > IMPRESSION: 1. Stable cardiomegaly with streaky basilar atelectasis 2. Similar appearance  of diffuse bilateral fine reticular interstitial opacity, suspected to be due to chronic interstitial disease.  Assessment: 1. ESRD TTS via TDC. HD again today to get back on schedule.  2. Hypoxia - pulm edema (resolved) +COPD. Per primary.  3. BP/volume - volume down, not sure has much more excess volume to get. 1L removed today, 3L yesterday. Is now about 4kg under her prior dry wt and 10 kg down from admission wt.  4. Diarrhea-  resolved, transient 5. FTT - appreciate palliative care assistance. Pt has very poor memory. Pt is DNR.  Overall prognosis is poor.  She wants to continue dialysis for now.  6. Recent SDH 7. DM2 8. MDD/SDH 9. Dispo - OK for dc home from renal standpoint  P - as above   Kelly Splinter, MD 09/29/2018, 11:41 AM   Recent Labs  Lab 09/26/18 0822 09/28/18 0920 09/29/18 0758  NA 132* 130* 133*  K 4.3 4.4 3.8  CL 91* 90* 93*  CO2 25 22 23   GLUCOSE 174* 164* 228*  BUN 53* 56* 30*  CREATININE 6.85* 6.90* 5.19*  CALCIUM 8.0* 9.1 9.0  PHOS 8.3*  --  4.9*   Recent Labs  Lab 09/23/18 2320  09/26/18 0821 09/28/18 0920 09/29/18 0758  WBC 8.0   < > 11.9* 12.6* 13.5*  NEUTROABS 6.1  --   --  10.1*  --   HGB 8.6*   < > 8.5* 9.7* 10.1*  HCT 28.9*   < > 28.1* 30.9* 32.1*  MCV 100.3*   < > 100.4* 95.4 95.0  PLT 143*   < > 176 154 138*   < > = values in this interval not displayed.

## 2018-09-29 NOTE — Progress Notes (Signed)
Renal Navigator received message from Clinic Manager/V. Shuttleworth that, in fact, patient cannot have [facility] provide O2 concentrator for use at OP HD clinic [place] because they can only use [employer] approved equipment. Renal Navigator asked if [employer] can provide patient with the equipment required to fulfill her O2 need, as their current machine is not capable. Renal Navigator notified CM/D. Lovena Le of this change in information and will follow up based on response from [place].  Alphonzo Cruise,  Renal Navigator [phone]

## 2018-09-29 NOTE — Plan of Care (Signed)

## 2018-09-30 DIAGNOSIS — J441 Chronic obstructive pulmonary disease with (acute) exacerbation: Secondary | ICD-10-CM

## 2018-09-30 DIAGNOSIS — J9622 Acute and chronic respiratory failure with hypercapnia: Secondary | ICD-10-CM

## 2018-09-30 LAB — GLUCOSE, CAPILLARY
Glucose-Capillary: 132 mg/dL — ABNORMAL HIGH (ref 70–99)
Glucose-Capillary: 89 mg/dL (ref 70–99)
Glucose-Capillary: 127 mg/dL — ABNORMAL HIGH (ref 70–99)
Glucose-Capillary: 131 mg/dL — ABNORMAL HIGH (ref 70–99)

## 2018-09-30 MED ORDER — CHLORHEXIDINE GLUCONATE CLOTH 2 % EX PADS
6.0000 | MEDICATED_PAD | Freq: Every day | CUTANEOUS | Status: DC
  Administered 2018-10-01 – 2018-10-02 (×2): 6 via TOPICAL

## 2018-09-30 NOTE — Progress Notes (Signed)
Renal Navigator received message from [place] Clinic Manager/V. Shuttleworth that she has contacted [employer] about the request for an O2 concentrator that can meet patient's need for 6L of O2 (as the current machine at [place] only reads to 5L). She states she will follow up with Renal Navigator as soon as she has an answer from Clinical Services.  Alphonzo Cruise, [place] Renal Navigator [phone]

## 2018-09-30 NOTE — Progress Notes (Signed)
Patient refuses to wear BiPAP at night.

## 2018-09-30 NOTE — Progress Notes (Signed)
Admit: 09/23/2018 LOS: 6  59F ESRD TTS recent admission for weakness/falls hx/o SDH, FTT rec SNF but decided to go home, readmit with SOB, diarrhea, inc hypoxia  Subjective: seen in room, no new c/o.  Possibly going home today.   09/03 0701 - 09/04 0700 In: 780 [P.O.:780] Out: 1104   Filed Weights   09/29/18 0800 09/29/18 1059 09/30/18 0639  Weight: 102.6 kg 101.6 kg 101.4 kg    Scheduled Meds: . arformoterol  15 mcg Nebulization BID  . budesonide (PULMICORT) nebulizer solution  0.25 mg Nebulization BID  . calcitRIOL  0.25 mcg Oral Daily  . Chlorhexidine Gluconate Cloth  6 each Topical Q0600  . FLUoxetine  10 mg Oral Daily  . insulin aspart  0-5 Units Subcutaneous QHS  . insulin aspart  0-9 Units Subcutaneous TID WC  . insulin detemir  15 Units Subcutaneous Daily  . ipratropium-albuterol  3 mL Nebulization TID  . levothyroxine  112 mcg Oral QAC breakfast  . midodrine  10 mg Oral TID WC  . montelukast  10 mg Oral QHS  . rosuvastatin  40 mg Oral Daily  . sevelamer carbonate  1,600 mg Oral TID WC  . sodium chloride flush  3 mL Intravenous Q12H   Continuous Infusions: . sodium chloride     PRN Meds:.sodium chloride, acetaminophen **OR** acetaminophen, albuterol, clonazePAM, sodium chloride flush  Current Labs: reviewed   e 10 mg PO TIW before HD and mid treatment for SBP <95  Physical Exam:  Blood pressure (!) 144/83, pulse 79, temperature 98.2 F (36.8 C), temperature source Oral, resp. rate 18, height 5\' 1"  (1.549 m), weight 101.4 kg, SpO2 97 %. Chronically ill appearing, no disterss, on HD Chest cta bilat Regular, nl s1s2 no murmur rub Abd soft ntnd obese 1-2+ Edema LE's NF, gen weakness  Dialysis:[facility] 4h 400/1.5 105.5kg 2/2 bath Hep none TDC Calcitriol 1.5 mcg PO qHD Sensipar 60 mg PO qHD renvela 800mg  3 tabs AC Midodrine  CXR - 8/29 > IMPRESSION: 1. Stable cardiomegaly with streaky basilar atelectasis 2. Similar appearance of diffuse bilateral  fine reticular interstitial opacity, suspected to be due to chronic interstitial disease.  Assessment: 1. ESRD TTS via TDC. Next HD Sat 2. Hypoxia - pulm edema (resolved) +COPD. Per primary.  3. BP/volume - 3L off yest and 1L off today. BP's okay.  4. Diarrhea-  resolved, transient 5. FTT - appreciate palliative care assistance. Pt has poor memory. Pt is DNR.  Overall prognosis poor.  She wants to continue dialysis for now.  6. Recent SDH 7. DM2 8. MDD/SDH 9. Dispo - OK for dc home from renal standpoint  P - as above   Kelly Splinter, MD 09/30/2018, 12:28 PM   Recent Labs  Lab 09/26/18 0822 09/28/18 0920 09/29/18 0758  NA 132* 130* 133*  K 4.3 4.4 3.8  CL 91* 90* 93*  CO2 25 22 23   GLUCOSE 174* 164* 228*  BUN 53* 56* 30*  CREATININE 6.85* 6.90* 5.19*  CALCIUM 8.0* 9.1 9.0  PHOS 8.3*  --  4.9*   Recent Labs  Lab 09/23/18 2320  09/26/18 0821 09/28/18 0920 09/29/18 0758  WBC 8.0   < > 11.9* 12.6* 13.5*  NEUTROABS 6.1  --   --  10.1*  --   HGB 8.6*   < > 8.5* 9.7* 10.1*  HCT 28.9*   < > 28.1* 30.9* 32.1*  MCV 100.3*   < > 100.4* 95.4 95.0  PLT 143*   < > 176  154 138*   < > = values in this interval not displayed.

## 2018-09-30 NOTE — Progress Notes (Signed)
Manufacturing engineer [facility]) Community Based Palliative Care  Ms. Joan Mosley is enrolled in our community palliative care program.    [place] will continue to follow while hospitalized.  Please let [facility] know if any assistance is needed with d/c planning.  Venia Carbon RN, BSN, [facility] Liaison (in [place]) [phone]

## 2018-09-30 NOTE — Discharge Instructions (Signed)
Home Oxygen Use, Adult °When a medical condition keeps you from getting enough oxygen, your health care provider may instruct you to take extra oxygen at home. Your health care provider will let you know: °· When to take oxygen. °· For how long to take oxygen. °· How quickly oxygen should be delivered (flow rate), in liters per minute (LPM or L/M). °Home oxygen can be given through: °· A mask. °· A nasal cannula. This is a device or tube that goes in the nostrils. °· A transtracheal catheter. This is a small, flexible tube placed in the trachea. °· A tracheostomy. This is a surgically made opening in the trachea. °These devices are connected with tubing to an oxygen source, such as: °· A tank. Tanks hold oxygen in gas form. They must be replaced when the oxygen is used up. °· A liquid oxygen device. This holds oxygen in liquid form. It must be replaced when the oxygen is used up. °· An oxygen concentrator machine. This filters oxygen in the room. It uses electricity, so you must have a backup cylinder of oxygen in case the power goes out. °Supplies needed: °To use oxygen, you will need: °· A mask, nasal cannula, transtracheal catheter, or tracheostomy. °· An oxygen tank, a liquid oxygen device, or an oxygen concentrator. °· The tape that your health care provider recommends (optional). °If you use a transtracheal catheter and your prescribed flow rate is 1 LPM or greater, you will also need a humidifier. °Risks and complications °· Fire. This can happen if the oxygen is exposed to a heat source, flame, or spark. °· Injury to skin. This can happen if liquid oxygen touches your skin. °· Organ damage. This can happen if you get too little oxygen. °How to use oxygen °Your health care provider or a representative from your medical device company will show you how to use your oxygen device. Follow her or his instructions. The instructions may look something like this: °1. Wash your hands. °2. If you use an oxygen  concentrator, make sure it is plugged in. °3. Place one end of the tube into the port on the tank, device, or machine. °4. Place the mask over your nose and mouth. Or, place the nasal cannula and secure it with tape if instructed. If you use a tracheostomy or transtracheal catheter, connect it to the oxygen source as directed. °5. Make sure the liter-flow setting on the machine is at the level prescribed by your health care provider. °6. Turn on the machine or adjust the knob on the tank or device to the correct liter-flow setting. °7. When you are done, turn off and unplug the machine, or turn the knob to OFF. °How to clean and care for the oxygen supplies °Nasal cannula °· Clean it with a warm, wet cloth daily or as needed. °· Wash it with a liquid soap once a week. °· Rinse it thoroughly once or twice a week. °· Replace it every 2-4 weeks. °· If you have an infection, such as a cold or pneumonia, change the cannula when you get better. °Mask °· Replace it every 2-4 weeks. °· If you have an infection, such as a cold or pneumonia, change the mask when you get better. °Humidifier bottle °· Wash the bottle between each refill: °? Wash it with soap and warm water. °? Rinse it thoroughly. °? Disinfect it and its top. °? Air-dry it. °· Make sure it is dry before you refill it. °Oxygen concentrator °· Clean   the air filter at least twice a week according to directions from your home medical equipment and service company. °· Wipe down the cabinet every day. To do this: °? Unplug the unit. °? Wipe down the cabinet with a damp cloth. °? Dry the cabinet. °Other equipment °· Change any extra tubing every 1-3 months. °· Follow instructions from your health care provider about taking care of any other equipment. °Safety tips °Fire safety tips ° °· Keep your oxygen and oxygen supplies at least 5 ft away from sources of heat, flames, and sparks at all times. °· Do not allow smoking near your oxygen. Put up "no smoking" signs in  your home. Avoid smoking areas when in public. °· Do not use materials that can burn (are flammable) while you use oxygen. °· When you go to a restaurant with portable oxygen, ask to be seated in the nonsmoking section. °· Keep a fire extinguisher close by. Let your fire department know that you have oxygen in your home. °· Test your home smoke detectors regularly. °Traveling °· Secure your oxygen tank in the vehicle so that it does not move around. Follow instructions from your medical device company about how to safely secure your tank. °· Make sure you have enough oxygen for the amount of time you will be away from home. °· If you are planning air travel, contact the airline to find out if they allow the use of an approved portable oxygen concentrator. You may also need documents from your health care provider and medical device company before you travel. °General safety tips °· If you use an oxygen cylinder, make sure it is in a stand or secured to an object that will not move (fixed object). °· If you use liquid oxygen, make sure its container is kept upright. °· If you use an oxygen concentrator: °? Tell your electric company. Make sure you are given priority service in the event that your power goes out. °? Avoid using extension cords, if possible. °Follow these instructions at home: °· Use oxygen only as told by your health care provider. °· Do not use alcohol or other drugs that make you relax (sedating drugs) unless instructed. They can slow down your breathing rate and make it hard to get in enough oxygen. °· Know how and when to order a refill of oxygen. °· Always keep a spare tank of oxygen. Plan ahead for holidays when you may not be able to get a prescription filled. °· Use water-based lubricants on your lips or nostrils. Do not use oil-based products like petroleum jelly. °· To prevent skin irritation on your cheeks or behind your ears, tuck some gauze under the tubing. °Contact a health care  provider if: °· You get headaches often. °· You have shortness of breath. °· You have a lasting cough. °· You have anxiety. °· You are sleepy all the time. °· You develop an illness that affects your breathing. °· You cannot exercise at your regular level. °· You are restless. °· You have difficult or irregular breathing, and it is getting worse. °· You have a fever. °· You have persistent redness under your nose. °Get help right away if: °· You are confused. °· You have blue lips or fingernails. °· You are struggling to breathe. °Summary °· Your health care provider or a representative from your medical device company will show you how to use your oxygen device. Follow her or his instructions. °· If you use an oxygen concentrator, make   sure it is plugged in.  Make sure the liter-flow setting on the machine is at the level prescribed by your health care provider.  Keep your oxygen and oxygen supplies at least 5 ft away from sources of heat, flames, and sparks at all times. This information is not intended to replace advice given to you by your health care provider. Make sure you discuss any questions you have with your health care provider. Document Released: 04/04/2003 Document Revised: 07/01/2017 Document Reviewed: 08/06/2015 Elsevier Patient Education  2020 Elsevier Inc.  

## 2018-09-30 NOTE — Progress Notes (Signed)
Renal Navigator notes that patient has increased O2 need from 6L to 7-8L and informed Clinic Manager/Director of Operations of O2 need increase. Renal Navigator also spoke with Clinic Medical Director/Dr. Justin Mend to ensure he feels comfortable treating patient in the outpatient setting on this amount of O2. He states he approves as long as inpatient MD feels patient is stable for OP HD treatment at time of discharge. Renal Navigator explained to Dr. Justin Mend that a new new O2 concentrator has been ordered by [employer] and patient will need to remain in the hospital until [facility] clinic has obtained new machine.  Alphonzo Cruise, [place] Renal Navigator [phone]

## 2018-09-30 NOTE — Care Management Important Message (Signed)
Important Message  Patient Details  Name: Joan Mosley MRN: [id number] Date of Birth: 07/09/1945   Medicare Important Message Given:  Yes     Memory Argue 09/30/2018, 3:10 PM

## 2018-09-30 NOTE — Progress Notes (Signed)
Renal Navigator asked by CM/D. Lovena Le if patient can discharge home and return to inpatient HD unit for treatment until [facility] HD clinic[facility] obtains new O2 concentrator. Renal Navigator spoke with Acute HD unit Manager/V. [place] Silos who states patient would have to triage through the ED every time she comes in if she dialyzes in the Acute HD unit, which makes this a non-viable option. Renal Navigator informed CM.  Alphonzo Cruise, [place] Renal Navigator [phone]

## 2018-09-30 NOTE — Progress Notes (Signed)
Renal Navigator received message from [facility] Manager/V. Shuttleworth that she has gotten approval from [employer] to order an O2 concentrator that will provide a higher level of O2 in order to meet patient's needs. It is uncertain how long it will take to obtain the new machine, but she will notify Renal Navigator when it has arrived to the clinic. At this time, the current machine only provides 5L of O2.  Alphonzo Cruise, [place] Renal Navigator [phone]

## 2018-09-30 NOTE — Progress Notes (Signed)
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 are DETECTED. The SARS-CoV-2 RNA is generally detectable in upper and lower  respiratory specimens dur ing the acute phase of infection.  Positive  results are indicative of active infection with SARS-CoV-2.  Clinical  correlation with patient history and other diagnostic information is  necessary to determine patient infection status.  Positive results do  not rule out bacterial infection or co-infection with other viruses. If result is PRESUMPTIVE POSTIVE SARS-CoV-2 nucleic acids MAY BE PRESENT.   A presumptive positive result was obtained on the submitted specimen  and confirmed on repeat testing.  While 2019 novel coronavirus  (SARS-CoV-2) nucleic acids may be present in the submitted sample  additional  confirmatory testing may be necessary for epidemiological  and / or clinical management purposes  to differentiate between  SARS-CoV-2 and other Sarbecovirus  currently known to infect humans.  If clinically indicated additional testing with an alternate test  methodology [phone]) is advised. The SARS-CoV-2 RNA is generally  detectable in upper and lower respiratory sp ecimens during the acute  phase of infection. The expected result is Negative. Fact Sheet for Patients:  StrictlyIdeas.no Fact Sheet for Healthcare Providers: BankingDealers.co.za This test is not yet approved or cleared by the [place] FDA and has been authorized for detection and/or diagnosis of SARS-CoV-2 by FDA under an Emergency Use Authorization (EUA).  This EUA will remain in effect (meaning this test can be used) for the duration of the COVID-19 declaration under Section 564(b)(1) of the Act, 21 U.S.C. section 360bbb-3(b)(1), unless the authorization is terminated or revoked sooner. Performed at [facility], [place] [address], [place], [place] [address]      Time coordinating discharge: Over 30 minutes  SIGNED:   Darliss Cheney, MD  [facility] 09/30/2018, 11:26 AM Pager [phone]  If 7PM-7AM, please contact night-coverage [email or web address] Password [email or web address]

## 2018-10-01 LAB — RENAL FUNCTION PANEL
Albumin: 3.2 g/dL — ABNORMAL LOW (ref 3.5–5.0)
Anion gap: 16 — ABNORMAL HIGH (ref 5–15)
BUN: 44 mg/dL — ABNORMAL HIGH (ref 8–23)
CO2: 23 mmol/L (ref 22–32)
Calcium: 8.8 mg/dL — ABNORMAL LOW (ref 8.9–10.3)
Chloride: 93 mmol/L — ABNORMAL LOW (ref 98–111)
Creatinine, Ser: 7.35 mg/dL — ABNORMAL HIGH (ref 0.44–1.00)
GFR calc Af Amer: 6 mL/min — ABNORMAL LOW (ref >60)
GFR calc non Af Amer: 5 mL/min — ABNORMAL LOW (ref >60)
Glucose, Bld: 116 mg/dL — ABNORMAL HIGH (ref 70–99)
Phosphorus: 6.7 mg/dL — ABNORMAL HIGH (ref 2.5–4.6)
Potassium: 4.3 mmol/L (ref 3.5–5.1)
Sodium: 132 mmol/L — ABNORMAL LOW (ref 135–145)

## 2018-10-01 LAB — CBC
HCT: 29.5 % — ABNORMAL LOW (ref 36.0–46.0)
Hemoglobin: 9 g/dL — ABNORMAL LOW (ref 12.0–15.0)
MCH: 29.4 pg (ref 26.0–34.0)
MCHC: 30.5 g/dL (ref 30.0–36.0)
MCV: 96.4 fL (ref 80.0–100.0)
Platelets: 161 10*3/uL (ref 150–400)
RBC: 3.06 MIL/uL — ABNORMAL LOW (ref 3.87–5.11)
RDW: 16.5 % — ABNORMAL HIGH (ref 11.5–15.5)
WBC: 11.6 10*3/uL — ABNORMAL HIGH (ref 4.0–10.5)
nRBC: 0 % (ref 0.0–0.2)

## 2018-10-01 LAB — GLUCOSE, CAPILLARY
Glucose-Capillary: 119 mg/dL — ABNORMAL HIGH (ref 70–99)
Glucose-Capillary: 242 mg/dL — ABNORMAL HIGH (ref 70–99)
Glucose-Capillary: 116 mg/dL — ABNORMAL HIGH (ref 70–99)

## 2018-10-01 MED ORDER — ONDANSETRON HCL 4 MG/2ML IJ SOLN
4.0000 mg | Freq: Four times a day (QID) | INTRAMUSCULAR | Status: DC | PRN
  Administered 2018-10-01: 17:00:00 4 mg via INTRAVENOUS
  Filled 2018-10-01: qty 2

## 2018-10-01 MED ORDER — HEPARIN SODIUM (PORCINE) 1000 UNIT/ML IJ SOLN
1000.0000 [IU] | INTRAMUSCULAR | Status: DC | PRN
  Administered 2018-10-01: 11:00:00 1800 [IU] via INTRAVENOUS
  Administered 2018-10-01: 1900 [IU] via INTRAVENOUS
  Administered 2018-10-04: 12:00:00 3700 [IU] via INTRAVENOUS
  Filled 2018-10-01 (×2): qty 1

## 2018-10-01 MED ORDER — ONDANSETRON HCL 4 MG/2ML IJ SOLN
INTRAMUSCULAR | Status: AC
  Filled 2018-10-01: qty 2

## 2018-10-01 MED ORDER — HEPARIN SODIUM (PORCINE) 1000 UNIT/ML IJ SOLN
INTRAMUSCULAR | Status: AC
  Administered 2018-10-01: 1800 [IU] via INTRAVENOUS
  Filled 2018-10-01: qty 4

## 2018-10-01 MED ORDER — ONDANSETRON HCL 4 MG/2ML IJ SOLN
4.0000 mg | Freq: Once | INTRAMUSCULAR | Status: AC
  Administered 2018-10-01: 4 mg via INTRAVENOUS

## 2018-10-01 NOTE — Progress Notes (Signed)
PROGRESS NOTE   Joan Mosley  [id number]    DOB: [birth year]    DOA: 09/23/2018  PCP: Flossie Buffy, NP   I have briefly reviewed patients previous medical records in [facility].  Chief Complaint  Patient presents with  . Weakness  . Diarrhea    Brief Narrative:  73 year old female, lives with her family, nonambulatory/electric wheelchair mobile, PMH of end-stage COPD, chronic respiratory failure on home oxygen 5 L/min, ESRD on TTS HD, morbid obesity, DM 2 with renal complications, hypothyroid, HLD, hypotension, anxiety and depression, recent hospitalization 8/11-8/12 for subdural hematoma following fall, managed conservatively, declined SNF, 4 hospital admissions in the last 6 months, presented to [facility] ED via EMS on 8/28 due to generalized weakness, diarrhea and dyspnea.  She was admitted for acute on chronic hypoxic respiratory failure, advanced COPD, less likely an exacerbation, possible volume overload/pulmonary edema, diarrhea and hyperkalemia.  Seen by PCCM in ED.  Nephrology consulted for HD. PMT consulted for [place] discussion given FTT.  S/p HD with 4 L UF on 8/29.  Better but still remains on 10-12 L/min HFNC oxygen. Repeat HD on 8/31.  We have recommended to discharge her to SNF however she is declining that possibility.  Wean O2 to prior home level as tolerated prior to safe DC home. PMT consulted, DNR, continue HD and patient wants to return home and declines SNF.   Assessment & Plan:   Active Problems:   Type 2 diabetes mellitus with diabetic polyneuropathy, with long-term current use of insulin (HCC)   Hypothyroidism   ESRD (end stage renal disease) (HCC)   Pulmonary edema   Acute on chronic respiratory failure with hypoxia and hypercapnia (HCC)   COPD with acute exacerbation (HCC)   Acute on chronic respiratory failure with hypoxia/end-stage COPD/suspected OSA/interstitial lung disease  Suspect due to pulmonary edema complicating end-stage COPD and possibly  interstitial lung disease, and less likely due to COPD exacerbation as she has no wheezes on exam.  Volume management across HD, 4 L UF at HD on 8/29.  Completed brief course of IV steroids.  Titrate oxygen to maintain saturations between 88-92%.  She was on 10 L high flow oxygen in dialysis unit.  We reduced it to 7 L regular nasal cannula.  Can try BiPAP nightly and PRN> but patient refused that also.  Brovana, Pulmicort and bronchodilator nebulizations.  Continue Singulair.  Repeat dialysis today for further volume Mx.  Diarrhea  Transient and resolved.  ESRD on TTS HD/volume overload   Reportedly missed her dialysis on Tuesday PTA.  Had dialysis on Thursday however.  Nephrology input appreciated, HD treatment per nephrology.  Hyperkalemia  Resolved after HD.  Acute on chronic diastolic CHF  TTE [id number]: LVEF 60-65%.  Volume management across HD.  Management as indicated above.  Type II DM with hyperglycemia and renal complications.  Blood sugar controlled.  Continue Levemir 15 units daily and SSI.    Hypothyroid  Continue levothyroxine.  Hyperlipidemia  Continue Crestor.  Anxiety and depression  Continue fluoxetine and as needed clonazepam.  History of recent subdural hematoma  Managed conservatively.  No focal deficits.  Morbid obesity/Body mass index is 42.74 kg/m.  Anemia in ESRD  No bleeding reported.  At baseline hemoglobin appears to run in the 8 g range.  Hemoglobin stable low 8 g range.  Follow CBC at HD. and transfuse if hemoglobin 7 g or less.  Thrombocytopenia, mild  Resolved  Abnormal LFTs  Suspect due to passive hepatic congestion.  No GI symptoms.  Improving.  Follow CMP, not done today, added.  Adult failure to thrive  Secondary to advanced age and multiple severe significant and many irreversible comorbidities.  Ongoing frequent rehospitalization's.  PMT did [place] with patient and son on 8/30> new HCPOA (son [name]),  continue HD and hence not Hospice eligible, OP Palliative referral, DNR, declines SNF and wishes to return home which is going to be challenging as she is requiring high amount of oxygen.  DVT prophylaxis: SCD Code Status: DNR Family Communication: Discussed the situation with the son [name] yesterday in detail. Disposition: Patient was in fact discharged on 09/30/2018 but after the fact, I was informed that the facility that patient goes to have her scheduled dialysis does not have large oxygen concentrator to support 7 to 8 L of oxygen that patient requires and that it has been ordered for them but it is unknown how long it will take for them to get it in for that reason, patient needs to stay in the hospital to receive her IHD.  Consultants:  PCCM Nephrology  Procedures:  HD  Antimicrobials:  Azithromycin (course completed)   Subjective: Patient seen and examined in dialysis unit.  She had no complaint.  Denied any shortness of breath.  Wants to go home.  Objective:  Vitals:   10/01/18 0800 10/01/18 0830 10/01/18 0900 10/01/18 0930  BP: (!) 149/77 121/78 136/65 (!) 112/43  Pulse: 73 73 71 78  Resp: (!) 22 (!) 23 (!) 22 (!) 23  Temp:      TempSrc:      SpO2: 97% 96% 97% 99%  Weight:      Height:        Examination:  General exam: Appears calm and comfortable, morbidly obese Respiratory system: Clear to auscultation with diminished breath sounds at the bases. Respiratory effort normal. Cardiovascular system: S1 & S2 heard, RRR. No JVD, murmurs, rubs, gallops or clicks. No pedal edema. Gastrointestinal system: Abdomen is nondistended, soft and nontender. No organomegaly or masses felt. Normal bowel sounds heard. Central nervous system: Alert and oriented. No focal neurological deficits. Extremities: Symmetric 5 x 5 power. Skin: No rashes, lesions or ulcers.  Psychiatry: Judgement and insight appear normal. Mood & affect appropriate.   Data Reviewed: I have personally  reviewed following labs and imaging studies  CBC: Recent Labs  Lab 09/25/18 0248 09/26/18 0821 09/28/18 0920 09/29/18 0758 10/01/18 0808  WBC 8.7 11.9* 12.6* 13.5* 11.6*  NEUTROABS  --   --  10.1*  --   --   HGB 8.3* 8.5* 9.7* 10.1* 9.0*  HCT 27.6* 28.1* 30.9* 32.1* 29.5*  MCV 99.3 100.4* 95.4 95.0 96.4  PLT 172 176 154 138* [id number]   Basic Metabolic Panel: Recent Labs  Lab 09/25/18 0248 09/26/18 0822 09/28/18 0920 09/29/18 0758 10/01/18 0809  NA 133* 132* 130* 133* 132*  K 4.6 4.3 4.4 3.8 4.3  CL 93* 91* 90* 93* 93*  CO2 27 25 22 23 23   GLUCOSE 203* 174* 164* 228* 116*  BUN 25* 53* 56* 30* 44*  CREATININE 4.09* 6.85* 6.90* 5.19* 7.35*  CALCIUM 8.8* 8.0* 9.1 9.0 8.8*  MG  --   --  2.4  --   --   PHOS  --  8.3*  --  4.9* 6.7*   Liver Function Tests: Recent Labs  Lab 09/25/18 0248 09/26/18 0822 09/26/18 1446 09/28/18 0920 09/29/18 0758 10/01/18 0809  AST 81*  --  27 13*  --   --  ALT 146*  --  89* 46*  --   --   ALKPHOS 219*  --  185* 164*  --   --   BILITOT 0.7  --  0.6 0.8  --   --   PROT 5.9*  --  6.0* 6.5  --   --   ALBUMIN 2.9* 3.0* 3.0* 3.2* 3.5 3.2*    Cardiac Enzymes: No results for input(s): CKTOTAL, CKMB, CKMBINDEX, TROPONINI in the last 168 hours.  CBG: Recent Labs  Lab 09/30/18 0634 09/30/18 1146 09/30/18 1614 09/30/18 2107 10/01/18 0637  GLUCAP 132* 89 127* 131* 119*    Recent Results (from the past 240 hour(s))  SARS Coronavirus 2 [facility] order, Performed in [facility] hospital lab) Nasopharyngeal Nasopharyngeal Swab     Status: None   Collection Time: 09/24/18  1:08 AM   Specimen: Nasopharyngeal Swab  Result Value Ref Range Status   SARS Coronavirus 2 NEGATIVE NEGATIVE Final    Comment: (NOTE) If result is NEGATIVE SARS-CoV-2 target nucleic acids are NOT DETECTED. The SARS-CoV-2 RNA is generally detectable in upper and lower  respiratory specimens during the acute phase of infection. The lowest  concentration of SARS-CoV-2  viral copies this assay can detect is 250  copies / mL. A negative result does not preclude SARS-CoV-2 infection  and should not be used as the sole basis for treatment or other  patient management decisions.  A negative result may occur with  improper specimen collection / handling, submission of specimen other  than nasopharyngeal swab, presence of viral mutation(s) within the  areas targeted by this assay, and inadequate number of viral copies  (<250 copies / mL). A negative result must be combined with clinical  observations, patient history, and epidemiological information. If result is POSITIVE SARS-CoV-2 target nucleic acids are DETECTED. The SARS-CoV-2 RNA is generally detectable in upper and lower  respiratory specimens dur ing the acute phase of infection.  Positive  results are indicative of active infection with SARS-CoV-2.  Clinical  correlation with patient history and other diagnostic information is  necessary to determine patient infection status.  Positive results do  not rule out bacterial infection or co-infection with other viruses. If result is PRESUMPTIVE POSTIVE SARS-CoV-2 nucleic acids MAY BE PRESENT.   A presumptive positive result was obtained on the submitted specimen  and confirmed on repeat testing.  While 2019 novel coronavirus  (SARS-CoV-2) nucleic acids may be present in the submitted sample  additional confirmatory testing may be necessary for epidemiological  and / or clinical management purposes  to differentiate between  SARS-CoV-2 and other Sarbecovirus currently known to infect humans.  If clinically indicated additional testing with an alternate test  methodology [phone]) is advised. The SARS-CoV-2 RNA is generally  detectable in upper and lower respiratory sp ecimens during the acute  phase of infection. The expected result is Negative. Fact Sheet for Patients:  StrictlyIdeas.no Fact Sheet for Healthcare Providers:  BankingDealers.co.za This test is not yet approved or cleared by the [place] FDA and has been authorized for detection and/or diagnosis of SARS-CoV-2 by FDA under an Emergency Use Authorization (EUA).  This EUA will remain in effect (meaning this test can be used) for the duration of the COVID-19 declaration under Section 564(b)(1) of the Act, 21 U.S.C. section 360bbb-3(b)(1), unless the authorization is terminated or revoked sooner. Performed at [facility], [place] [address] [place], [place] [address]      Radiology Studies: No results found.  Scheduled Meds: .  heparin      . arformoterol  15 mcg Nebulization BID  . budesonide (PULMICORT) nebulizer solution  0.25 mg Nebulization BID  . calcitRIOL  0.25 mcg Oral Daily  . Chlorhexidine Gluconate Cloth  6 each Topical Q0600  . Chlorhexidine Gluconate Cloth  6 each Topical Q0600  . FLUoxetine  10 mg Oral Daily  . insulin aspart  0-5 Units Subcutaneous QHS  . insulin aspart  0-9 Units Subcutaneous TID WC  . insulin detemir  15 Units Subcutaneous Daily  . ipratropium-albuterol  3 mL Nebulization TID  . levothyroxine  112 mcg Oral QAC breakfast  . midodrine  10 mg Oral TID WC  . montelukast  10 mg Oral QHS  . rosuvastatin  40 mg Oral Daily  . sevelamer carbonate  1,600 mg Oral TID WC  . sodium chloride flush  3 mL Intravenous Q12H   Continuous Infusions: . sodium chloride       LOS: 7 days    Darliss Cheney, MD, [facility]  To contact the attending provider between 7A-7P or the covering provider during after hours 7P-7A, please log into the web site [email or web address] and access using universal [place] password for that web site. If you do not have the password, please call the hospital operator.  10/01/2018, 10:19 AM

## 2018-10-01 NOTE — Progress Notes (Signed)
Admit: 09/23/2018 LOS: 7  49F ESRD TTS recent admission for weakness/falls hx/o SDH, FTT rec SNF but decided to go home, readmit with SOB, diarrhea, inc hypoxia  Subjective: seen on HD, no c/o.   No intake/output data recorded.  Filed Weights   09/30/18 0639 10/01/18 0317 10/01/18 0740  Weight: 101.4 kg 104.6 kg 102.6 kg    Scheduled Meds: . arformoterol  15 mcg Nebulization BID  . budesonide (PULMICORT) nebulizer solution  0.25 mg Nebulization BID  . calcitRIOL  0.25 mcg Oral Daily  . Chlorhexidine Gluconate Cloth  6 each Topical Q0600  . Chlorhexidine Gluconate Cloth  6 each Topical Q0600  . FLUoxetine  10 mg Oral Daily  . insulin aspart  0-5 Units Subcutaneous QHS  . insulin aspart  0-9 Units Subcutaneous TID WC  . insulin detemir  15 Units Subcutaneous Daily  . ipratropium-albuterol  3 mL Nebulization TID  . levothyroxine  112 mcg Oral QAC breakfast  . midodrine  10 mg Oral TID WC  . montelukast  10 mg Oral QHS  . rosuvastatin  40 mg Oral Daily  . sevelamer carbonate  1,600 mg Oral TID WC  . sodium chloride flush  3 mL Intravenous Q12H   Continuous Infusions: . sodium chloride     PRN Meds:.sodium chloride, acetaminophen **OR** acetaminophen, albuterol, clonazePAM, heparin, sodium chloride flush  Current Labs: reviewed   e 10 mg PO TIW before HD and mid treatment for SBP <95  Physical Exam:  Blood pressure (!) 112/43, pulse 78, temperature 98.6 F (37 C), temperature source Oral, resp. rate (!) 23, height 5\' 1"  (1.549 m), weight 102.6 kg, SpO2 99 %. Chronically ill appearing, no disterss, on HD Chest cta bilat Regular, nl s1s2 no murmur rub Abd soft ntnd obese 1-2+ Edema LE's NF, gen weakness  [facility] 4h 400/1.5 105.5kg 2/2 bath Hep none TDC Calcitriol 1.5 mcg PO qHD Sensipar 60 mg PO qHD renvela 800mg  3 tabs AC Midodrine  CXR - 8/29 > IMPRESSION: 1. Stable cardiomegaly with streaky basilar atelectasis 2. Similar appearance of diffuse  bilateral fine reticular interstitial opacity, suspected to be due to chronic interstitial disease.  Assessment: 1. ESRD TTS via TDC. HD today. Unfortunately we cannot dc to OP dialysis yet due to issues that arise around high-flow oxygen.  See SW notes. Hopefully these can be resolved early next week.  2. Hypoxia - pulm edema (resolved) +chronic COPD. Home O2 usual dose is 5-6 L . Per primary.  3. BP/volume - cont to lower vol as tolerated. On exam no sig excess volume but tolerating UF today so far.  4. Diarrhea-  resolved, transient 5. FTT - appreciate palliative care assistance. Pt has poor memory. Pt is DNR.  Overall prognosis poor.  She wants to continue dialysis for now.  6. Recent SDH 7. DM2 8. MDD/SDH 9. Dispo - as above, hopefully can dc early next week 10. DNR   P - as above   Kelly Splinter, MD 10/01/2018, 10:05 AM   Recent Labs  Lab 09/26/18 [id number] 09/28/18 0920 09/29/18 0758 10/01/18 0809  NA 132* 130* 133* 132*  K 4.3 4.4 3.8 4.3  CL 91* 90* 93* 93*  CO2 25 22 23 23   GLUCOSE 174* 164* 228* 116*  BUN 53* 56* 30* 44*  CREATININE 6.85* 6.90* 5.19* 7.35*  CALCIUM 8.0* 9.1 9.0 8.8*  PHOS 8.3*  --  4.9* 6.7*   Recent Labs  Lab 09/28/18 0920 09/29/18 0758 10/01/18 0808  WBC 12.6* 13.5*  11.6*  NEUTROABS 10.1*  --   --   HGB 9.7* 10.1* 9.0*  HCT 30.9* 32.1* 29.5*  MCV 95.4 95.0 96.4  PLT 154 138* 161

## 2018-10-01 NOTE — Progress Notes (Signed)
Pt feeling little tired and exhausted today after the episode of HD today. Sat in a recliner for 2-3 hours, daughter was in bedside and updated, denies chest pain, SOB and distress, Zofran provided for nausea, will continue to monitor the patient  Palma Holter, RN

## 2018-10-01 NOTE — Progress Notes (Signed)
Patient refused use of BIPAP for the evening.  °

## 2018-10-01 NOTE — Progress Notes (Signed)
Pt left for HD at 7.15 pm, morning assessment done prior to send her to HD   [place], [place]

## 2018-10-02 LAB — GLUCOSE, CAPILLARY
Glucose-Capillary: 127 mg/dL — ABNORMAL HIGH (ref 70–99)
Glucose-Capillary: 156 mg/dL — ABNORMAL HIGH (ref 70–99)
Glucose-Capillary: 103 mg/dL — ABNORMAL HIGH (ref 70–99)
Glucose-Capillary: 141 mg/dL — ABNORMAL HIGH (ref 70–99)

## 2018-10-02 NOTE — Progress Notes (Signed)
RT NOTES: Pt on 7L HFNC, sats 87%. Increased liter flow to 9lpm. Sats now 91%. Will continue to monitor.

## 2018-10-02 NOTE — Progress Notes (Signed)
Admit: 09/23/2018 LOS: 8  Joan Mosley ESRD TTS recent admission for weakness/falls hx/o SDH, FTT rec SNF but decided to go home, readmit with SOB, diarrhea, inc hypoxia  Subjective: seen on HD, no c/o.   09/05 0701 - 09/06 0700 In: 840 [P.O.:840] Out: 1781   Filed Weights   10/01/18 0740 10/01/18 1048 10/02/18 0500  Weight: 102.6 kg 101.1 kg 99.2 kg    Scheduled Meds: . arformoterol  15 mcg Nebulization BID  . budesonide (PULMICORT) nebulizer solution  0.25 mg Nebulization BID  . calcitRIOL  0.25 mcg Oral Daily  . Chlorhexidine Gluconate Cloth  6 each Topical Q0600  . Chlorhexidine Gluconate Cloth  6 each Topical Q0600  . FLUoxetine  10 mg Oral Daily  . insulin aspart  0-5 Units Subcutaneous QHS  . insulin aspart  0-9 Units Subcutaneous TID WC  . insulin detemir  15 Units Subcutaneous Daily  . ipratropium-albuterol  3 mL Nebulization TID  . levothyroxine  112 mcg Oral QAC breakfast  . midodrine  10 mg Oral TID WC  . montelukast  10 mg Oral QHS  . rosuvastatin  40 mg Oral Daily  . sevelamer carbonate  1,600 mg Oral TID WC  . sodium chloride flush  3 mL Intravenous Q12H   Continuous Infusions: . sodium chloride     PRN Meds:.sodium chloride, acetaminophen **OR** acetaminophen, albuterol, clonazePAM, heparin, ondansetron (ZOFRAN) IV, sodium chloride flush  Current Labs: reviewed   e 10 mg PO TIW before HD and mid treatment for SBP <95  Physical Exam:  Blood pressure (!) 149/90, pulse 80, temperature 98.1 F (36.7 C), temperature source Oral, resp. rate 15, height 5\' 1"  (1.549 m), weight 99.2 kg, SpO2 92 %. Chronically ill appearing, no disterss, on HD Chest cta bilat Regular, nl s1s2 no murmur rub Abd soft ntnd obese 1-2+ Edema LE's NF, gen weakness  D[facility] 4h 400/1.5 105.5kg 2/2 bath Hep none TDC Calcitriol 1.5 mcg PO qHD Sensipar 60 mg PO qHD renvela 800mg  3 tabs AC Midodrine  CXR - 8/29 > IMPRESSION: 1. Stable cardiomegaly with streaky basilar  atelectasis.  2. Similar appearance of diffuse bilateral fine reticular interstitial opacity, suspected to be due to chronic interstitial disease.  Assessment: 1. ESRD TTS via TDC. HD today. Unfortunately we cannot dc to OP dialysis yet due to issues that arise around high-flow oxygen.  See SW notes. Hopefully these can be resolved early this week.  2. Hypoxia - pulm edema (resolved) +chronic COPD. On Home O2 usual dose is 5-6 L [place]. Per primary.  3. BP/volume - down 11kg from admit and 5kg under prior dry wt. No sig edema on exam now. Lower edw at dc.  4. Diarrhea-  resolved, transient 5. FTT - appreciate palliative care assistance. Pt has poor memory. Pt is DNR.  Overall prognosis poor.  She wants to continue dialysis for now.  6. Recent SDH 7. DM2 8. MDD/SDH 9. Dispo - as above, hopefully can dc early this week 10. DNR   P - as above   Kelly Splinter, MD 10/02/2018, 12:22 PM   Recent Labs  Lab 09/26/18 [id number] 09/28/18 0920 09/29/18 0758 10/01/18 0809  NA 132* 130* 133* 132*  K 4.3 4.4 3.8 4.3  CL 91* 90* 93* 93*  CO2 25 22 23 23   GLUCOSE 174* 164* 228* 116*  BUN 53* 56* 30* 44*  CREATININE 6.85* 6.90* 5.19* 7.35*  CALCIUM 8.0* 9.1 9.0 8.8*  PHOS 8.3*  --  4.9* 6.7*   Recent  Labs  Lab 09/28/18 0920 09/29/18 0758 10/01/18 0808  WBC 12.6* 13.5* 11.6*  NEUTROABS 10.1*  --   --   HGB 9.7* 10.1* 9.0*  HCT 30.9* 32.1* 29.5*  MCV 95.4 95.0 96.4  PLT 154 138* 161

## 2018-10-02 NOTE — Progress Notes (Signed)
Pt sat in a recliner for 2-3 hours, tolerated well, insisting to go home, pt's Son [name] tried to convince her regarding the plan. Pt's oxygen saturation running between 89-91, current 9l via HFNC, denies chest pain, SOB and distress, will continue to monitor  Palma Holter, RN

## 2018-10-02 NOTE — Progress Notes (Signed)
PROGRESS NOTE   Joan Mosley  [id number]    DOB: 01-04-1946    DOA: 09/23/2018  PCP: Flossie Buffy, NP   I have briefly reviewed patients previous medical records in [facility].  Chief Complaint  Patient presents with  . Weakness  . Diarrhea    Brief Narrative:  73 year old female, lives with her family, nonambulatory/electric wheelchair mobile, PMH of end-stage COPD, chronic respiratory failure on home oxygen 5 L/min, ESRD on TTS HD, morbid obesity, DM 2 with renal complications, hypothyroid, HLD, hypotension, anxiety and depression, recent hospitalization 8/11-8/12 for subdural hematoma following fall, managed conservatively, declined SNF, 4 hospital admissions in the last 6 months, presented to [facility] ED via EMS on 8/28 due to generalized weakness, diarrhea and dyspnea.  She was admitted for acute on chronic hypoxic respiratory failure, advanced COPD, less likely an exacerbation, possible volume overload/pulmonary edema, diarrhea and hyperkalemia.  Seen by PCCM in ED.  Nephrology consulted for HD. PMT consulted for [place] discussion given FTT.  S/p HD with 4 L UF on 8/29.  Better but still remains on 10-12 L/min HFNC oxygen. Repeat HD on 8/31.  We have recommended to discharge her to SNF however she is declining that possibility.  Wean O2 to prior home level as tolerated prior to safe DC home. PMT consulted, DNR, continue HD and patient wants to return home and declines SNF.   Assessment & Plan:   Active Problems:   Type 2 diabetes mellitus with diabetic polyneuropathy, with long-term current use of insulin (HCC)   Hypothyroidism   ESRD (end stage renal disease) (HCC)   Pulmonary edema   Acute on chronic respiratory failure with hypoxia and hypercapnia (HCC)   COPD with acute exacerbation (HCC)   Acute on chronic respiratory failure with hypoxia/end-stage COPD/suspected OSA/interstitial lung disease  Suspect due to pulmonary edema complicating end-stage COPD and possibly  interstitial lung disease, and less likely due to COPD exacerbation as she has no wheezes on exam.  Volume management across HD, 4 L UF at HD on 8/29.  Completed brief course of IV steroids.  Titrate oxygen to maintain saturations between 88-92%.  She was on 10 L high flow oxygen in dialysis unit.  We reduced it to 7 L regular nasal cannula.  Can try BiPAP nightly and PRN> but patient refused that also.  Brovana, Pulmicort and bronchodilator nebulizations.  Continue Singulair.  Apparently, she dropped her oxygen saturation to 70s last night while being on 7 L nasal so she was bumped up to 10 to 11 L of high flow oxygen.  When I saw her in the room, she was saturating 90% on 11 L high flow.  I discussed with her primary nurse and asked him to bring it down to 6 or 7 L of nasal cannula with the goal to keep her oxygen saturation between 88 and 92%.  Diarrhea  Transient and resolved.  ESRD on TTS HD/volume overload   Reportedly missed her dialysis on Tuesday PTA.  Had dialysis on Thursday however.  Nephrology input appreciated, HD treatment per nephrology.  Hyperkalemia  Resolved after HD.  Acute on chronic diastolic CHF  TTE 99991111: LVEF 60-65%.  Volume management across HD.  Management as indicated above.  Type II DM with hyperglycemia and renal complications.  Blood sugar controlled.  Continue Levemir 15 units daily and SSI.    Hypothyroid  Continue levothyroxine.  Hyperlipidemia  Continue Crestor.  Anxiety and depression  Continue fluoxetine and as needed clonazepam.  History of  recent subdural hematoma  Managed conservatively.  No focal deficits.  Morbid obesity/Body mass index is 41.32 kg/m.  Anemia in ESRD  No bleeding reported.  At baseline hemoglobin appears to run in the 8 g range.  Hemoglobin stable low 8 g range.  Follow CBC at HD. and transfuse if hemoglobin 7 g or less.  Thrombocytopenia, mild  Resolved  Abnormal LFTs  Suspect due to  passive hepatic congestion.  No GI symptoms.  Improving.  Follow CMP, not done today, added.  Adult failure to thrive  Secondary to advanced age and multiple severe significant and many irreversible comorbidities.  Ongoing frequent rehospitalization's.  PMT did [place] with patient and son on 8/30> new HCPOA (son [name]), continue HD and hence not Hospice eligible, OP Palliative referral, DNR, declines SNF and wishes to return home which is going to be challenging as she is requiring high amount of oxygen.  DVT prophylaxis: SCD Code Status: DNR Family Communication: Discussed the situation with the son [name] yesterday in detail. Disposition: Patient was in fact discharged on 09/30/2018 but after the fact, I was informed that the facility that patient goes to have her scheduled dialysis does not have large oxygen concentrator to support 7 to 8 L of oxygen that patient requires and that it has been ordered for them but it is unknown how long it will take for them to get it in for that reason, patient needs to stay in the hospital to receive her IHD.  Consultants:  PCCM Nephrology  Procedures:  HD  Antimicrobials:  Azithromycin (course completed)   Subjective: Patient seen and examined.  She is comfortable and has no complaints.  Denied any shortness of breath.  Objective:  Vitals:   10/02/18 0304 10/02/18 0500 10/02/18 0725 10/02/18 0728  BP: (!) 168/99  (!) 158/87   Pulse: 76  77   Resp: 17  (!) 26   Temp: 98.2 F (36.8 C)  98.1 F (36.7 C)   TempSrc: Oral  Oral   SpO2: 91%  98% 96%  Weight:  99.2 kg    Height:        Examination:  General exam: Appears calm and comfortable, morbidly obese Respiratory system: Very minimal crackles at the bases bilaterally. Respiratory effort normal. Cardiovascular system: S1 & S2 heard, RRR. No JVD, murmurs, rubs, gallops or clicks.  Trace pitting edema bilateral lower extremity Gastrointestinal system: Abdomen is nondistended, soft and  nontender. No organomegaly or masses felt. Normal bowel sounds heard. Central nervous system: Alert and oriented. No focal neurological deficits. Extremities: Symmetric 5 x 5 power. Skin: No rashes, lesions or ulcers.  Psychiatry: Judgement and insight appear poor, mood & affect appropriate.    Data Reviewed: I have personally reviewed following labs and imaging studies  CBC: Recent Labs  Lab 09/26/18 0821 09/28/18 0920 09/29/18 0758 10/01/18 0808  WBC 11.9* 12.6* 13.5* 11.6*  NEUTROABS  --  10.1*  --   --   HGB 8.5* 9.7* 10.1* 9.0*  HCT 28.1* 30.9* 32.1* 29.5*  MCV 100.4* 95.4 95.0 96.4  PLT 176 154 138* [id number]   Basic Metabolic Panel: Recent Labs  Lab 09/26/18 0822 09/28/18 0920 09/29/18 0758 10/01/18 0809  NA 132* 130* 133* 132*  K 4.3 4.4 3.8 4.3  CL 91* 90* 93* 93*  CO2 25 22 23 23   GLUCOSE 174* 164* 228* 116*  BUN 53* 56* 30* 44*  CREATININE 6.85* 6.90* 5.19* 7.35*  CALCIUM 8.0* 9.1 9.0 8.8*  MG  --  2.4  --   --  PHOS 8.3*  --  4.9* 6.7*   Liver Function Tests: Recent Labs  Lab 09/26/18 0822 09/26/18 1446 09/28/18 0920 09/29/18 0758 10/01/18 0809  AST  --  27 13*  --   --   ALT  --  89* 46*  --   --   ALKPHOS  --  185* 164*  --   --   BILITOT  --  0.6 0.8  --   --   PROT  --  6.0* 6.5  --   --   ALBUMIN 3.0* 3.0* 3.2* 3.5 3.2*    Cardiac Enzymes: No results for input(s): CKTOTAL, CKMB, CKMBINDEX, TROPONINI in the last 168 hours.  CBG: Recent Labs  Lab 09/30/18 2107 10/01/18 0637 10/01/18 1550 10/01/18 2110 10/02/18 0625  GLUCAP 131* 119* 242* 116* 127*    Recent Results (from the past 240 hour(s))  SARS Coronavirus 2 [facility] order, Performed in [facility] hospital lab) Nasopharyngeal Nasopharyngeal Swab     Status: None   Collection Time: 09/24/18  1:08 AM   Specimen: Nasopharyngeal Swab  Result Value Ref Range Status   SARS Coronavirus 2 NEGATIVE NEGATIVE Final    Comment: (NOTE) If result is NEGATIVE SARS-CoV-2 target nucleic  acids are NOT DETECTED. The SARS-CoV-2 RNA is generally detectable in upper and lower  respiratory specimens during the acute phase of infection. The lowest  concentration of SARS-CoV-2 viral copies this assay can detect is 250  copies / mL. A negative result does not preclude SARS-CoV-2 infection  and should not be used as the sole basis for treatment or other  patient management decisions.  A negative result may occur with  improper specimen collection / handling, submission of specimen other  than nasopharyngeal swab, presence of viral mutation(s) within the  areas targeted by this assay, and inadequate number of viral copies  (<250 copies / mL). A negative result must be combined with clinical  observations, patient history, and epidemiological information. If result is POSITIVE SARS-CoV-2 target nucleic acids are DETECTED. The SARS-CoV-2 RNA is generally detectable in upper and lower  respiratory specimens dur ing the acute phase of infection.  Positive  results are indicative of active infection with SARS-CoV-2.  Clinical  correlation with patient history and other diagnostic information is  necessary to determine patient infection status.  Positive results do  not rule out bacterial infection or co-infection with other viruses. If result is PRESUMPTIVE POSTIVE SARS-CoV-2 nucleic acids MAY BE PRESENT.   A presumptive positive result was obtained on the submitted specimen  and confirmed on repeat testing.  While 2019 novel coronavirus  (SARS-CoV-2) nucleic acids may be present in the submitted sample  additional confirmatory testing may be necessary for epidemiological  and / or clinical management purposes  to differentiate between  SARS-CoV-2 and other Sarbecovirus currently known to infect humans.  If clinically indicated additional testing with an alternate test  methodology [phone]) is advised. The SARS-CoV-2 RNA is generally  detectable in upper and lower respiratory sp  ecimens during the acute  phase of infection. The expected result is Negative. Fact Sheet for Patients:  StrictlyIdeas.no Fact Sheet for Healthcare Providers: BankingDealers.co.za This test is not yet approved or cleared by the [place] FDA and has been authorized for detection and/or diagnosis of SARS-CoV-2 by FDA under an Emergency Use Authorization (EUA).  This EUA will remain in effect (meaning this test can be used) for the duration of the COVID-19 declaration under Section 564(b)(1) of the Act, 21 U.S.C. section  360bbb-3(b)(1), unless the authorization is terminated or revoked sooner. Performed at [facility], [place] [address], [place], [place] [address]      Radiology Studies: No results found.  Scheduled Meds: . arformoterol  15 mcg Nebulization BID  . budesonide (PULMICORT) nebulizer solution  0.25 mg Nebulization BID  . calcitRIOL  0.25 mcg Oral Daily  . Chlorhexidine Gluconate Cloth  6 each Topical Q0600  . Chlorhexidine Gluconate Cloth  6 each Topical Q0600  . FLUoxetine  10 mg Oral Daily  . insulin aspart  0-5 Units Subcutaneous QHS  . insulin aspart  0-9 Units Subcutaneous TID WC  . insulin detemir  15 Units Subcutaneous Daily  . ipratropium-albuterol  3 mL Nebulization TID  . levothyroxine  112 mcg Oral QAC breakfast  . midodrine  10 mg Oral TID WC  . montelukast  10 mg Oral QHS  . rosuvastatin  40 mg Oral Daily  . sevelamer carbonate  1,600 mg Oral TID WC  . sodium chloride flush  3 mL Intravenous Q12H   Continuous Infusions: . sodium chloride       LOS: 8 days    Darliss Cheney, MD, [facility]  To contact the attending provider between 7A-7P or the covering provider during after hours 7P-7A, please log into the web site [email or web address] and access using universal [place] password for that web site. If you do not have the password, please call the hospital operator.  10/02/2018, 10:06 AM

## 2018-10-02 NOTE — Plan of Care (Signed)

## 2018-10-03 LAB — GLUCOSE, CAPILLARY
Glucose-Capillary: 120 mg/dL — ABNORMAL HIGH (ref 70–99)
Glucose-Capillary: 93 mg/dL (ref 70–99)
Glucose-Capillary: 130 mg/dL — ABNORMAL HIGH (ref 70–99)
Glucose-Capillary: 133 mg/dL — ABNORMAL HIGH (ref 70–99)

## 2018-10-03 NOTE — Care Management Important Message (Signed)
Important Message  Patient Details  Name: Joan Mosley MRN: [id number] Date of Birth: 10-15-1945   Medicare Important Message Given:  Yes     Memory Argue 10/03/2018, 3:07 PM

## 2018-10-03 NOTE — Progress Notes (Signed)
Patient ID: Joan Mosley, female   DOB: [birth year], 73 y.o.   MRN: [id number]  [facility] Progress Note   Assessment/ Plan:   1.  Acute on chronic hypoxemic respiratory failure: Increasing supplemental oxygen requirements noted and etiology suspected to be from pulmonary edema/COPD exacerbation.  Continue efforts at volume unloading and titrating of oxygen. 2. ESRD: She is usually on a TTS dialysis schedule and does not have any acute/compelling indications for dialysis today.  Will order for hemodialysis tomorrow.  No heparin. 3. Anemia: Hemoglobin/hematocrit level are low, will re-dose ESA with hemodialysis tomorrow. 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 5. Nutrition: Continue renal diet with nutritional supplementation 6. Hypertension: Blood pressures marginally elevated, continue to monitor on current therapy/hemodialysis  Subjective:   Expresses frustration at still being in the hospital/barriers to discharge and informs me that she might sign out AMA today.   Objective:   BP (!) 147/84 (BP Location: Left Wrist)   Pulse (!) 58   Temp 98 F (36.7 C) (Oral)   Resp 19   Ht 5\' 1"  (1.549 m)   Wt 104.9 kg   SpO2 99%   BMI 43.70 kg/m   Physical Exam: Gen: Appears comfortable resting in bed, on oxygen via nasal cannula CVS: Pulse regular bradycardia, S1 and S2 normal Resp: Distant breath sounds bilaterally without distinct rales or rhonchi Abd: Soft, obese, nontender  Ext: Chronic 1-2+ lower extremity edema with stasis changes  Labs: BMET Recent Labs  Lab 09/28/18 0920 09/29/18 0758 10/01/18 0809  NA 130* 133* 132*  K 4.4 3.8 4.3  CL 90* 93* 93*  CO2 22 23 23   GLUCOSE 164* 228* 116*  BUN 56* 30* 44*  CREATININE 6.90* 5.19* 7.35*  CALCIUM 9.1 9.0 8.8*  PHOS  --  4.9* 6.7*   CBC Recent Labs  Lab 09/28/18 0920 09/29/18 0758 10/01/18 0808  WBC 12.6* 13.5*  11.6*  NEUTROABS 10.1*  --   --   HGB 9.7* 10.1* 9.0*  HCT 30.9* 32.1* 29.5*  MCV 95.4 95.0 96.4  PLT 154 138* 161   Medications:    . arformoterol  15 mcg Nebulization BID  . budesonide (PULMICORT) nebulizer solution  0.25 mg Nebulization BID  . calcitRIOL  0.25 mcg Oral Daily  . Chlorhexidine Gluconate Cloth  6 each Topical Q0600  . Chlorhexidine Gluconate Cloth  6 each Topical Q0600  . FLUoxetine  10 mg Oral Daily  . insulin aspart  0-5 Units Subcutaneous QHS  . insulin aspart  0-9 Units Subcutaneous TID WC  . insulin detemir  15 Units Subcutaneous Daily  . ipratropium-albuterol  3 mL Nebulization TID  . levothyroxine  112 mcg Oral QAC breakfast  . midodrine  10 mg Oral TID WC  . montelukast  10 mg Oral QHS  . rosuvastatin  40 mg Oral Daily  . sevelamer carbonate  1,600 mg Oral TID WC  . sodium chloride flush  3 mL Intravenous Q12H   Elmarie Shiley, MD 10/03/2018, 10:55 AM

## 2018-10-03 NOTE — Progress Notes (Signed)
PROGRESS NOTE   Joan Mosley  [id number]    DOB: 25-Apr-1945    DOA: 09/23/2018  PCP: Flossie Buffy, NP   I have briefly reviewed patients previous medical records in [facility].  Chief Complaint  Patient presents with  . Weakness  . Diarrhea    Brief Narrative:  73 year old female, lives with her family, nonambulatory/electric wheelchair mobile, PMH of end-stage COPD, chronic respiratory failure on home oxygen 5 L/min, ESRD on TTS HD, morbid obesity, DM 2 with renal complications, hypothyroid, HLD, hypotension, anxiety and depression, recent hospitalization 8/11-8/12 for subdural hematoma following fall, managed conservatively, declined SNF, 4 hospital admissions in the last 6 months, presented to [facility] ED via EMS on 8/28 due to generalized weakness, diarrhea and dyspnea.  She was admitted for acute on chronic hypoxic respiratory failure, advanced COPD, less likely an exacerbation, possible volume overload/pulmonary edema, diarrhea and hyperkalemia.  Seen by PCCM in ED.  Nephrology consulted for HD. PMT consulted for [place] discussion given FTT.  S/p HD with 4 L UF on 8/29.  Better but still remains on 10-12 L/min HFNC oxygen. Repeat HD on 8/31.  We have recommended to discharge her to SNF however she is declining that possibility.  Wean O2 to prior home level as tolerated prior to safe DC home. PMT consulted, DNR, continue HD and patient wants to return home and declines SNF.   Assessment & Plan:   Active Problems:   Type 2 diabetes mellitus with diabetic polyneuropathy, with long-term current use of insulin (HCC)   Hypothyroidism   ESRD (end stage renal disease) (HCC)   Pulmonary edema   Acute on chronic respiratory failure with hypoxia and hypercapnia (HCC)   COPD with acute exacerbation (HCC)   Acute on chronic respiratory failure with hypoxia/end-stage COPD/suspected OSA/interstitial lung disease  Suspect due to pulmonary edema complicating end-stage COPD and possibly  interstitial lung disease, and less likely due to COPD exacerbation as she has no wheezes on exam.  Volume management across HD, 4 L UF at HD on 8/29.  Completed brief course of IV steroids.  Titrate oxygen to maintain saturations between 88-92%.  She was on 10 L high flow oxygen in dialysis unit.  We reduced it to 7 L regular nasal cannula.  Can try BiPAP nightly and PRN> but patient refused that also.  Brovana, Pulmicort and bronchodilator nebulizations.  Continue Singulair.  She keeps dropping her oxygen saturation.  Mostly maintains 90% on 7 to 8 L of oxygen.  Diarrhea  Transient and resolved.  ESRD on TTS HD/volume overload   Reportedly missed her dialysis on Tuesday PTA.  Had dialysis on Thursday however.  Nephrology input appreciated, HD treatment per nephrology.  Hyperkalemia  Resolved after HD.  Acute on chronic diastolic CHF  TTE 99991111: LVEF 60-65%.  Volume management across HD.  Management as indicated above.  Type II DM with hyperglycemia and renal complications.  Blood sugar controlled.  Continue Levemir 15 units daily and SSI.    Hypothyroid  Continue levothyroxine.  Hyperlipidemia  Continue Crestor.  Anxiety and depression  Continue fluoxetine and as needed clonazepam.  History of recent subdural hematoma  Managed conservatively.  No focal deficits.  Morbid obesity/Body mass index is 43.7 kg/m.  Anemia in ESRD  No bleeding reported.  At baseline hemoglobin appears to run in the 8 g range.  Hemoglobin stable low 8 g range.  Follow CBC at HD. and transfuse if hemoglobin 7 g or less.  Thrombocytopenia, mild  Resolved  Abnormal LFTs  Suspect due to passive hepatic congestion.  No GI symptoms.  Improving.  Follow CMP, not done today, added.  Adult failure to thrive  Secondary to advanced age and multiple severe significant and many irreversible comorbidities.  Ongoing frequent rehospitalization's.  PMT did [place] with patient and  son on 8/30> new HCPOA (son [name]), continue HD and hence not Hospice eligible, OP Palliative referral, DNR, declines SNF and wishes to return home which is going to be challenging as she is requiring high amount of oxygen.  DVT prophylaxis: SCD Code Status: DNR Family Communication: Discussed the situation with the son [name] yesterday in detail. Disposition: Patient was in fact discharged on 09/30/2018 but after the fact, I was informed that the facility that patient goes to have her scheduled dialysis does not have large oxygen concentrator to support 7 to 8 L of oxygen that patient requires and that it has been ordered for them but it is unknown how long it will take for them to get it in for that reason, patient needs to stay in the hospital to receive her IHD.  Consultants:  PCCM Nephrology  Procedures:  HD  Antimicrobials:  Azithromycin (course completed)   Subjective: Patient seen and examined.  No complaints.  Denied any shortness of breath.  Objective:  Vitals:   10/03/18 0100 10/03/18 0405 10/03/18 0623 10/03/18 0726  BP:  140/76  (!) 147/84  Pulse: 76 72  (!) 58  Resp: 20 (!) 22  19  Temp:  97.8 F (36.6 C)  98 F (36.7 C)  TempSrc:  Oral  Oral  SpO2: 94% 98%  99%  Weight:   104.9 kg   Height:        Examination:  General exam: Appears calm and comfortable, morbidly obese Respiratory system: Clear to auscultation. Respiratory effort normal. Cardiovascular system: S1 & S2 heard, RRR. No JVD, murmurs, rubs, gallops or clicks.  Trace pitting edema Gastrointestinal system: Abdomen is nondistended, soft and nontender. No organomegaly or masses felt. Normal bowel sounds heard. Central nervous system: Alert and oriented. No focal neurological deficits. Extremities: Symmetric 5 x 5 power. Skin: No rashes, lesions or ulcers.  Psychiatry: Judgement and insight appear poor. Mood & affect appropriate.   Data Reviewed: I have personally reviewed following labs and imaging  studies  CBC: Recent Labs  Lab 09/28/18 0920 09/29/18 0758 10/01/18 0808  WBC 12.6* 13.5* 11.6*  NEUTROABS 10.1*  --   --   HGB 9.7* 10.1* 9.0*  HCT 30.9* 32.1* 29.5*  MCV 95.4 95.0 96.4  PLT 154 138* [id number]   Basic Metabolic Panel: Recent Labs  Lab 09/28/18 0920 09/29/18 0758 10/01/18 0809  NA 130* 133* 132*  K 4.4 3.8 4.3  CL 90* 93* 93*  CO2 22 23 23   GLUCOSE 164* 228* 116*  BUN 56* 30* 44*  CREATININE 6.90* 5.19* 7.35*  CALCIUM 9.1 9.0 8.8*  MG 2.4  --   --   PHOS  --  4.9* 6.7*   Liver Function Tests: Recent Labs  Lab 09/26/18 1446 09/28/18 0920 09/29/18 0758 10/01/18 0809  AST 27 13*  --   --   ALT 89* 46*  --   --   ALKPHOS 185* 164*  --   --   BILITOT 0.6 0.8  --   --   PROT 6.0* 6.5  --   --   ALBUMIN 3.0* 3.2* 3.5 3.2*    Cardiac Enzymes: No results for input(s): CKTOTAL, CKMB, CKMBINDEX, TROPONINI in  the last 168 hours.  CBG: Recent Labs  Lab 10/02/18 0625 10/02/18 1038 10/02/18 1626 10/02/18 2136 10/03/18 0622  GLUCAP 127* 156* 103* 141* 120*    Recent Results (from the past 240 hour(s))  SARS Coronavirus 2 [facility] order, Performed in [facility] hospital lab) Nasopharyngeal Nasopharyngeal Swab     Status: None   Collection Time: 09/24/18  1:08 AM   Specimen: Nasopharyngeal Swab  Result Value Ref Range Status   SARS Coronavirus 2 NEGATIVE NEGATIVE Final    Comment: (NOTE) If result is NEGATIVE SARS-CoV-2 target nucleic acids are NOT DETECTED. The SARS-CoV-2 RNA is generally detectable in upper and lower  respiratory specimens during the acute phase of infection. The lowest  concentration of SARS-CoV-2 viral copies this assay can detect is 250  copies / mL. A negative result does not preclude SARS-CoV-2 infection  and should not be used as the sole basis for treatment or other  patient management decisions.  A negative result may occur with  improper specimen collection / handling, submission of specimen other  than nasopharyngeal  swab, presence of viral mutation(s) within the  areas targeted by this assay, and inadequate number of viral copies  (<250 copies / mL). A negative result must be combined with clinical  observations, patient history, and epidemiological information. If result is POSITIVE SARS-CoV-2 target nucleic acids are DETECTED. The SARS-CoV-2 RNA is generally detectable in upper and lower  respiratory specimens dur ing the acute phase of infection.  Positive  results are indicative of active infection with SARS-CoV-2.  Clinical  correlation with patient history and other diagnostic information is  necessary to determine patient infection status.  Positive results do  not rule out bacterial infection or co-infection with other viruses. If result is PRESUMPTIVE POSTIVE SARS-CoV-2 nucleic acids MAY BE PRESENT.   A presumptive positive result was obtained on the submitted specimen  and confirmed on repeat testing.  While 2019 novel coronavirus  (SARS-CoV-2) nucleic acids may be present in the submitted sample  additional confirmatory testing may be necessary for epidemiological  and / or clinical management purposes  to differentiate between  SARS-CoV-2 and other Sarbecovirus currently known to infect humans.  If clinically indicated additional testing with an alternate test  methodology [id number]) is advised. The SARS-CoV-2 RNA is generally  detectable in upper and lower respiratory sp ecimens during the acute  phase of infection. The expected result is Negative. Fact Sheet for Patients:  StrictlyIdeas.no Fact Sheet for Healthcare Providers: BankingDealers.co.za This test is not yet approved or cleared by the [place] FDA and has been authorized for detection and/or diagnosis of SARS-CoV-2 by FDA under an Emergency Use Authorization (EUA).  This EUA will remain in effect (meaning this test can be used) for the duration of the COVID-19 declaration  under Section 564(b)(1) of the Act, 21 U.S.C. section 360bbb-3(b)(1), unless the authorization is terminated or revoked sooner. Performed at [facility], [place] [address], [place], [place] [address]      Radiology Studies: No results found.  Scheduled Meds: . arformoterol  15 mcg Nebulization BID  . budesonide (PULMICORT) nebulizer solution  0.25 mg Nebulization BID  . calcitRIOL  0.25 mcg Oral Daily  . Chlorhexidine Gluconate Cloth  6 each Topical Q0600  . Chlorhexidine Gluconate Cloth  6 each Topical Q0600  . FLUoxetine  10 mg Oral Daily  . insulin aspart  0-5 Units Subcutaneous QHS  . insulin aspart  0-9 Units Subcutaneous TID WC  . insulin detemir  15 Units Subcutaneous Daily  . ipratropium-albuterol  3 mL Nebulization TID  . levothyroxine  112 mcg Oral QAC breakfast  . midodrine  10 mg Oral TID WC  . montelukast  10 mg Oral QHS  . rosuvastatin  40 mg Oral Daily  . sevelamer carbonate  1,600 mg Oral TID WC  . sodium chloride flush  3 mL Intravenous Q12H   Continuous Infusions: . sodium chloride       LOS: 9 days    Darliss Cheney, MD, [facility]  To contact the attending provider between 7A-7P or the covering provider during after hours 7P-7A, please log into the web site [email or web address] and access using universal [facility] password for that web site. If you do not have the password, please call the hospital operator.  10/03/2018, 9:49 AM

## 2018-10-03 NOTE — Progress Notes (Signed)
Decreased oxygen from, 10L to 8L. SPo2 goal of 88% and above per MD.

## 2018-10-04 LAB — RENAL FUNCTION PANEL
Albumin: 3.4 g/dL — ABNORMAL LOW (ref 3.5–5.0)
Anion gap: 18 — ABNORMAL HIGH (ref 5–15)
BUN: 70 mg/dL — ABNORMAL HIGH (ref 8–23)
CO2: 21 mmol/L — ABNORMAL LOW (ref 22–32)
Calcium: 8.8 mg/dL — ABNORMAL LOW (ref 8.9–10.3)
Chloride: 89 mmol/L — ABNORMAL LOW (ref 98–111)
Creatinine, Ser: 10.75 mg/dL — ABNORMAL HIGH (ref 0.44–1.00)
GFR calc Af Amer: 4 mL/min — ABNORMAL LOW (ref >60)
GFR calc non Af Amer: 3 mL/min — ABNORMAL LOW (ref >60)
Glucose, Bld: 133 mg/dL — ABNORMAL HIGH (ref 70–99)
Phosphorus: 6.6 mg/dL — ABNORMAL HIGH (ref 2.5–4.6)
Potassium: 5 mmol/L (ref 3.5–5.1)
Sodium: 128 mmol/L — ABNORMAL LOW (ref 135–145)

## 2018-10-04 LAB — CBC
HCT: 29.4 % — ABNORMAL LOW (ref 36.0–46.0)
Hemoglobin: 9.5 g/dL — ABNORMAL LOW (ref 12.0–15.0)
MCH: 30.2 pg (ref 26.0–34.0)
MCHC: 32.3 g/dL (ref 30.0–36.0)
MCV: 93.3 fL (ref 80.0–100.0)
Platelets: 201 10*3/uL (ref 150–400)
RBC: 3.15 MIL/uL — ABNORMAL LOW (ref 3.87–5.11)
RDW: 16 % — ABNORMAL HIGH (ref 11.5–15.5)
WBC: 11.7 10*3/uL — ABNORMAL HIGH (ref 4.0–10.5)
nRBC: 0 % (ref 0.0–0.2)

## 2018-10-04 LAB — GLUCOSE, CAPILLARY
Glucose-Capillary: 117 mg/dL — ABNORMAL HIGH (ref 70–99)
Glucose-Capillary: 131 mg/dL — ABNORMAL HIGH (ref 70–99)
Glucose-Capillary: 99 mg/dL (ref 70–99)
Glucose-Capillary: 162 mg/dL — ABNORMAL HIGH (ref 70–99)

## 2018-10-04 MED ORDER — HEPARIN SODIUM (PORCINE) 1000 UNIT/ML IJ SOLN
INTRAMUSCULAR | Status: AC
  Administered 2018-10-04: 3700 [IU] via INTRAVENOUS
  Filled 2018-10-04: qty 4

## 2018-10-04 MED ORDER — CLONAZEPAM 1 MG PO TABS: 1.0000 mg | ORAL_TABLET | Freq: Two times a day (BID) | ORAL | 0 refills | Status: DC | PRN

## 2018-10-04 MED ORDER — MIDODRINE HCL 5 MG PO TABS
ORAL_TABLET | ORAL | Status: AC
  Administered 2018-10-04: 10 mg via ORAL
  Filled 2018-10-04: qty 2

## 2018-10-04 MED ORDER — CALCITRIOL 0.25 MCG PO CAPS
ORAL_CAPSULE | ORAL | Status: AC
  Administered 2018-10-04: 0.25 ug via ORAL
  Filled 2018-10-04: qty 1

## 2018-10-04 NOTE — Progress Notes (Signed)
PROGRESS NOTE   Evanny Faso  [id number]    DOB: 1945-04-05    DOA: 09/23/2018  PCP: Flossie Buffy, NP   I have briefly reviewed patients previous medical records in [facility].  Chief Complaint  Patient presents with  . Weakness  . Diarrhea    Brief Narrative:  73 year old female, lives with her family, nonambulatory/electric wheelchair mobile, PMH of end-stage COPD, chronic respiratory failure on home oxygen 5 L/min, ESRD on TTS HD, morbid obesity, DM 2 with renal complications, hypothyroid, HLD, hypotension, anxiety and depression, recent hospitalization 8/11-8/12 for subdural hematoma following fall, managed conservatively, declined SNF, 4 hospital admissions in the last 6 months, presented to [facility] ED via EMS on 8/28 due to generalized weakness, diarrhea and dyspnea.  She was admitted for acute on chronic hypoxic respiratory failure, advanced COPD, less likely an exacerbation, possible volume overload/pulmonary edema, diarrhea and hyperkalemia.  Seen by PCCM in ED.  Nephrology consulted for HD. PMT consulted for [place] discussion given FTT.  S/p HD with 4 L UF on 8/29.  Better but still remains on 10-12 L/min HFNC oxygen. Repeat HD on 8/31.  We have recommended to discharge her to SNF however she is declining that possibility.  Wean O2 to prior home level as tolerated prior to safe DC home. PMT consulted, DNR, continue HD and patient wants to return home and declines SNF.   Assessment & Plan:   Active Problems:   Type 2 diabetes mellitus with diabetic polyneuropathy, with long-term current use of insulin (HCC)   Hypothyroidism   ESRD (end stage renal disease) (HCC)   Pulmonary edema   Acute on chronic respiratory failure with hypoxia and hypercapnia (HCC)   COPD with acute exacerbation (HCC)   Acute on chronic respiratory failure with hypoxia/end-stage COPD/suspected OSA/interstitial lung disease  Suspect due to pulmonary edema complicating end-stage COPD and possibly  interstitial lung disease, and less likely due to COPD exacerbation as she has no wheezes on exam.  Volume management across HD, 4 L UF at HD on 8/29.  Completed brief course of IV steroids.  Titrate oxygen to maintain saturations between 88-92%.  She was on 10 L high flow oxygen in dialysis unit.  We reduced it to 7 L regular nasal cannula.  Can try BiPAP nightly and PRN> but patient refused that also.  Brovana, Pulmicort and bronchodilator nebulizations.  Continue Singulair.  She keeps dropping her oxygen saturation.  Mostly maintains 90% on 7 to 8 L of oxygen with occasional requirement of 9 to 10 L.  Diarrhea  Transient and resolved.  ESRD on TTS HD/volume overload   Reportedly missed her dialysis on Tuesday PTA.  Had dialysis on Thursday however.  Nephrology input appreciated, HD treatment per nephrology.  Hyperkalemia  Resolved after HD.  Acute on chronic diastolic CHF  TTE 99991111: LVEF 60-65%.  Volume management across HD.  Management as indicated above.  Type II DM with hyperglycemia and renal complications.  Blood sugar controlled.  Continue Levemir 15 units daily and SSI.    Hypothyroid  Continue levothyroxine.  Hyperlipidemia  Continue Crestor.  Anxiety and depression  Continue fluoxetine and as needed clonazepam.  History of recent subdural hematoma  Managed conservatively.  No focal deficits.  Morbid obesity/Body mass index is 43.2 kg/m.  Anemia in ESRD  No bleeding reported.  At baseline hemoglobin appears to run in the 8 g range.  Hemoglobin stable low 8 g range.  Follow CBC at HD. and transfuse if hemoglobin 7 g  or less.  Thrombocytopenia, mild  Resolved  Abnormal LFTs  Suspect due to passive hepatic congestion.  No GI symptoms.  Improving.  Follow CMP, not done today, added.  Adult failure to thrive  Secondary to advanced age and multiple severe significant and many irreversible comorbidities.  Ongoing frequent  rehospitalization's.  PMT did [place] with patient and son on 8/30> new HCPOA (son [name]), continue HD and hence not Hospice eligible, OP Palliative referral, DNR, declines SNF and wishes to return home which is going to be challenging as she is requiring high amount of oxygen.  DVT prophylaxis: SCD Code Status: DNR Family Communication: Discussed the situation with the son [name] yesterday in detail. Disposition: Patient was in fact discharged on 09/30/2018 but after the fact, I was informed that the facility that patient goes to have her scheduled dialysis does not have large oxygen concentrator to support 7 to 8 L of oxygen that patient requires and that it has been ordered for them and that it will take anywhere from 10 to 14 days from today 10/04/2018 to get it for them.   Patient will need to be hospitalized until they get the oxygen concentrator.  Consultants:  PCCM Nephrology  Procedures:  HD  Antimicrobials:  Azithromycin (course completed)   Subjective: Patient seen and examined at dialysis unit today.  She has no complaints.  No shortness of breath.  No chest pain.  Objective:  Vitals:   10/04/18 1100 10/04/18 1130 10/04/18 1140 10/04/18 1202  BP: 133/61 (!) 133/57 (!) 120/36 (!) 128/59  Pulse: 78 91 86 80  Resp: (!) 21 20 20 20   Temp:   (!) 97.5 F (36.4 C) 97.7 F (36.5 C)  TempSrc:   Oral Oral  SpO2:   100% 100%  Weight:      Height:        Examination:  General exam: Appears calm and comfortable, morbidly obese Respiratory system: Clear to auscultation. Respiratory effort normal. Cardiovascular system: S1 & S2 heard, RRR. No JVD, murmurs, rubs, gallops or clicks. No pedal edema. Gastrointestinal system: Abdomen is nondistended, soft and nontender. No organomegaly or masses felt. Normal bowel sounds heard. Central nervous system: Alert and oriented. No focal neurological deficits. Extremities: Symmetric 5 x 5 power. Skin: No rashes, lesions or ulcers.  Psychiatry:  Judgement and insight appear poor. Mood & affect appropriate.   Data Reviewed: I have personally reviewed following labs and imaging studies  CBC: Recent Labs  Lab 09/28/18 0920 09/29/18 0758 10/01/18 0808 10/04/18 0734  WBC 12.6* 13.5* 11.6* 11.7*  NEUTROABS 10.1*  --   --   --   HGB 9.7* 10.1* 9.0* 9.5*  HCT 30.9* 32.1* 29.5* 29.4*  MCV 95.4 95.0 96.4 93.3  PLT 154 138* 161 [id number]   Basic Metabolic Panel: Recent Labs  Lab 09/28/18 0920 09/29/18 0758 10/01/18 0809 10/04/18 0733  NA 130* 133* 132* 128*  K 4.4 3.8 4.3 5.0  CL 90* 93* 93* 89*  CO2 22 23 23  21*  GLUCOSE 164* 228* 116* 133*  BUN 56* 30* 44* 70*  CREATININE 6.90* 5.19* 7.35* 10.75*  CALCIUM 9.1 9.0 8.8* 8.8*  MG 2.4  --   --   --   PHOS  --  4.9* 6.7* 6.6*   Liver Function Tests: Recent Labs  Lab 09/28/18 0920 09/29/18 0758 10/01/18 0809 10/04/18 0733  AST 13*  --   --   --   ALT 46*  --   --   --  ALKPHOS 164*  --   --   --   BILITOT 0.8  --   --   --   PROT 6.5  --   --   --   ALBUMIN 3.2* 3.5 3.2* 3.4*    Cardiac Enzymes: No results for input(s): CKTOTAL, CKMB, CKMBINDEX, TROPONINI in the last 168 hours.  CBG: Recent Labs  Lab 10/03/18 1121 10/03/18 1823 10/03/18 2127 10/04/18 0615 10/04/18 1204  GLUCAP 93 130* 133* 131* 99    No results found for this or any previous visit (from the past 240 hour(s)).   Radiology Studies: No results found.  Scheduled Meds: . arformoterol  15 mcg Nebulization BID  . budesonide (PULMICORT) nebulizer solution  0.25 mg Nebulization BID  . calcitRIOL  0.25 mcg Oral Daily  . Chlorhexidine Gluconate Cloth  6 each Topical Q0600  . Chlorhexidine Gluconate Cloth  6 each Topical Q0600  . FLUoxetine  10 mg Oral Daily  . insulin aspart  0-5 Units Subcutaneous QHS  . insulin aspart  0-9 Units Subcutaneous TID WC  . insulin detemir  15 Units Subcutaneous Daily  . ipratropium-albuterol  3 mL Nebulization TID  . levothyroxine  112 mcg Oral QAC breakfast   . midodrine  10 mg Oral TID WC  . montelukast  10 mg Oral QHS  . rosuvastatin  40 mg Oral Daily  . sevelamer carbonate  1,600 mg Oral TID WC  . sodium chloride flush  3 mL Intravenous Q12H   Continuous Infusions: . sodium chloride       LOS: 10 days    Darliss Cheney, MD, [facility]  To contact the attending provider between 7A-7P or the covering provider during after hours 7P-7A, please log into the web site [email or web address] and access using universal  password for that web site. If you do not have the password, please call the hospital operator.  10/04/2018, 2:17 PM

## 2018-10-04 NOTE — TOC Transition Note (Addendum)
Transition of Care [facility]) - CM/SW Discharge Note   Patient Details  Name: Joan Mosley MRN: [id number] Date of Birth: November 15, 1945  [facility] of [facility]) CM/SW Contact:  Zenon Mayo, RN Phone Number: 10/04/2018, 5:20 PM   Clinical Narrative:    Patient for dc to [facility], [facility], NCM spoke with son [name], and he and patient is agreeable to go to [facility] today, referral made with [facility] [phone].  Patient will be transported via [place].  Transport has been called.  RN to call report to [phone] or [phone]. She is going to bed 324.. The accepting physician is Dr. Elijio Miles [phone].     Final next level of care: Long Term Acute Care (LTAC) Barriers to Discharge: No Barriers Identified   Patient Goals and CMS Choice Patient states their goals for this hospitalization and ongoing recovery are:: get better to go home CMS Medicare.gov Compare Post Acute Care list provided to:: Patient Represenative (must comment) Choice offered to / list presented to : Adult Children  Discharge Placement                       Discharge Plan and Services In-house Referral: NA Discharge Planning Services: CM Consult Post Acute Care Choice: Long Term Acute Care (LTAC)          DME Arranged: (NA) DME Agency: [facility] Date DME Agency Contacted: 09/29/18 Time DME Agency ContactedDS:[id number] Representative spoke with at DME Agency: [employer] rep [place] Arranged: NA [place] Agency: [place] Date [place]: 09/29/18 Time [place]: [place] Representative spoke with at [place]: [place] ([place]) Interventions     Readmission Risk Interventions Readmission Risk Prevention Plan 10/04/2018  Transportation Screening Complete  Medication Review Press photographer) Complete  PCP or Specialist appointment within 3-5 days of discharge Complete  HRI or [place] Complete  SW Recovery Care/Counseling Consult Complete   Palliative Care Screening Complete  [place] Patient Refused
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
## 2018-10-04 NOTE — Progress Notes (Addendum)
Renal Navigator received message from Clinic Manager/V. Shuttleworth stating she hopes to receive new O2 concentrator at the clinic in 10 days to 2 weeks and since this is keeping patient from discharge from the hospital, she will make an exception and allow patient to bring her own O2 equipment, but that it cannot be left at the clinic. She states that it will need to be taken back and forth each time patient has dialysis. If the [facility] cannot transport the O2 machine with patient, family will have to transport machine back and forth from OP HD. Clinic Manager states she has discussed this plan with patient's son. Renal Navigator left message for CM/D. Lovena Le to discuss and will discuss with Nephrologist.  2:04pm Update: Renal Navigator received call back from [place] who states that she has spoken with [employer] and states that, in fact, she will not be able to allow patient to temporarily bring her own O2 concentrator back and forth to the OP HD clinic. She will not be able to return to the OP HD clinic until they receive O2 concentrator that can meet patient's O2 needs. The machine has been ordered and is scheduled to arrive in 10 days to 2 weeks. Navigator updated CM/D.Lovena Le.   Alphonzo Cruise, [place] Renal Navigator [phone]

## 2018-10-04 NOTE — Procedures (Signed)
Patient seen on Hemodialysis. BP 139/75   Pulse 82   Temp 97.8 F (36.6 C) (Oral)   Resp 19   Ht 5\' 1"  (1.549 m)   Wt 103.7 kg   SpO2 97%   BMI 43.20 kg/m   QB 350, UF goal 3L Tolerating treatment without complaints at this time.   Elmarie Shiley MD [facility]. Office # [phone] Pager # [phone] 9:30 AM

## 2018-10-04 NOTE — Progress Notes (Signed)
Patient ID: Joan Mosley, female   DOB: [birth year], 73 y.o.   MRN: [id number]  [facility] Progress Note   Assessment/ Plan:   1.  Acute on chronic hypoxemic respiratory failure: Increasing supplemental oxygen requirements noted and etiology suspected to be from pulmonary edema/COPD exacerbation.  Remains on oxygen via HFNC at 10L/min (usually on 5-7L/min at home). 2. ESRD: Continue HD on a TTS dialysis schedule - without florid volume excess and acute electrolyte abnormalities.  No heparin. 3. Anemia: Hemoglobin/hematocrit level are low, will re-dose ESA with hemodialysis tomorrow. 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 5. Nutrition: Continue renal diet with nutritional supplementation 6. Hypertension: Blood pressures elevated, continue to monitor on current therapy/hemodialysis  Subjective:   Reports to be feeling better today and wants to try and get home for her grandson's 16th birthday.    Objective:   BP 139/75   Pulse 82   Temp 97.8 F (36.6 C) (Oral)   Resp 19   Ht 5\' 1"  (1.549 m)   Wt 103.7 kg   SpO2 97%   BMI 43.20 kg/m   Physical Exam: Gen: Appears comfortable resting in dialysis, on oxygen via nasal cannula CVS: Pulse regular rhythm and normal rate, S1 and S2 normal Resp: Distant breath sounds bilaterally without distinct rales or rhonchi Abd: Soft, obese, nontender  Ext: Chronic 1-2+ lower extremity edema with stasis changes  Labs: BMET Recent Labs  Lab 09/28/18 0920 09/29/18 0758 10/01/18 0809 10/04/18 0733  NA 130* 133* 132* 128*  K 4.4 3.8 4.3 5.0  CL 90* 93* 93* 89*  CO2 22 23 23  21*  GLUCOSE 164* 228* 116* 133*  BUN 56* 30* 44* 70*  CREATININE 6.90* 5.19* 7.35* 10.75*  CALCIUM 9.1 9.0 8.8* 8.8*  PHOS  --  4.9* 6.7* 6.6*   CBC Recent Labs  Lab 09/28/18 0920 09/29/18 0758 10/01/18 0808 10/04/18 0734  WBC 12.6* 13.5* 11.6* 11.7*   NEUTROABS 10.1*  --   --   --   HGB 9.7* 10.1* 9.0* 9.5*  HCT 30.9* 32.1* 29.5* 29.4*  MCV 95.4 95.0 96.4 93.3  PLT 154 138* 161 201   Medications:    . arformoterol  15 mcg Nebulization BID  . budesonide (PULMICORT) nebulizer solution  0.25 mg Nebulization BID  . calcitRIOL  0.25 mcg Oral Daily  . Chlorhexidine Gluconate Cloth  6 each Topical Q0600  . Chlorhexidine Gluconate Cloth  6 each Topical Q0600  . FLUoxetine  10 mg Oral Daily  . insulin aspart  0-5 Units Subcutaneous QHS  . insulin aspart  0-9 Units Subcutaneous TID WC  . insulin detemir  15 Units Subcutaneous Daily  . ipratropium-albuterol  3 mL Nebulization TID  . levothyroxine  112 mcg Oral QAC breakfast  . midodrine  10 mg Oral TID WC  . montelukast  10 mg Oral QHS  . rosuvastatin  40 mg Oral Daily  . sevelamer carbonate  1,600 mg Oral TID WC  . sodium chloride flush  3 mL Intravenous Q12H   Elmarie Shiley, MD 10/04/2018, 9:30 AM

## 2018-10-04 NOTE — Progress Notes (Signed)
Patient discharged to [facility] per MD order. Patient went by [facility].Spoke and gave  Report  to Lewis Shock, Therapist, sports . All personal articles sent with patient thru [place].

## 2018-10-05 NOTE — Discharge Summary (Signed)
Discharge summary already done on 10/04/2018 however it was selected as " progress note".  Please refer to that as discharge summary.

## 2018-10-07 ENCOUNTER — Telehealth: Payer: Self-pay | Admitting: Nurse Practitioner

## 2018-10-07 NOTE — Telephone Encounter (Signed)
Spoke with [facility], he stated they have been trying to work with the pt for referral we placed on 08/2018. Pt is in and out from the hospital so it is hard to get in touch with the pt. Last note from the hospital stating pt discharge with the care of [facility]. Referral is going to be cancel for right now until provider place an order again.   Baldo Ash is aware.     Copied from [place] [phone]. Topic: General - Other >> Oct 07, 2018  9:35 AM Carolyn Stare wrote:  [facility] with [facility] call to say pt is going to [facility] for long term acute care and they can't see her until after she come out to put her under of Palliative care ,they will referral for now   [phone]   option 3

## 2018-10-10 ENCOUNTER — Ambulatory Visit: Payer: Medicare Other | Admitting: Nurse Practitioner

## 2018-10-10 DIAGNOSIS — J9621 Acute and chronic respiratory failure with hypoxia: Secondary | ICD-10-CM

## 2018-10-10 DIAGNOSIS — J449 Chronic obstructive pulmonary disease, unspecified: Secondary | ICD-10-CM

## 2018-10-10 DIAGNOSIS — N186 End stage renal disease: Secondary | ICD-10-CM

## 2018-10-11 DIAGNOSIS — J9621 Acute and chronic respiratory failure with hypoxia: Secondary | ICD-10-CM

## 2018-10-11 DIAGNOSIS — N186 End stage renal disease: Secondary | ICD-10-CM

## 2018-10-11 DIAGNOSIS — J449 Chronic obstructive pulmonary disease, unspecified: Secondary | ICD-10-CM

## 2018-10-12 DIAGNOSIS — J9621 Acute and chronic respiratory failure with hypoxia: Secondary | ICD-10-CM

## 2018-10-12 DIAGNOSIS — N186 End stage renal disease: Secondary | ICD-10-CM

## 2018-10-12 DIAGNOSIS — J449 Chronic obstructive pulmonary disease, unspecified: Secondary | ICD-10-CM

## 2018-10-14 ENCOUNTER — Other Ambulatory Visit: Payer: Self-pay

## 2018-10-14 ENCOUNTER — Ambulatory Visit (INDEPENDENT_AMBULATORY_CARE_PROVIDER_SITE_OTHER): Payer: Medicare Other | Admitting: Nurse Practitioner

## 2018-10-14 ENCOUNTER — Encounter: Payer: Self-pay | Admitting: Nurse Practitioner

## 2018-10-14 VITALS — BP 99/54 | HR 86 | Wt 214.6 lb

## 2018-10-14 DIAGNOSIS — N186 End stage renal disease: Secondary | ICD-10-CM

## 2018-10-14 DIAGNOSIS — E039 Hypothyroidism, unspecified: Secondary | ICD-10-CM

## 2018-10-14 DIAGNOSIS — E1142 Type 2 diabetes mellitus with diabetic polyneuropathy: Secondary | ICD-10-CM | POA: Diagnosis not present

## 2018-10-14 DIAGNOSIS — Z992 Dependence on renal dialysis: Secondary | ICD-10-CM

## 2018-10-14 DIAGNOSIS — F419 Anxiety disorder, unspecified: Secondary | ICD-10-CM

## 2018-10-14 DIAGNOSIS — Z794 Long term (current) use of insulin: Secondary | ICD-10-CM

## 2018-10-14 NOTE — Progress Notes (Signed)
Virtual Visit via Video Note  I connected with Joan Mosley on 10/18/18 at 11:00 AM EDT by a video enabled telemedicine application and verified that I am speaking with the correct person using two identifiers.  Location: Patient: Home with daughter Joan Mosley and son Joan Mosley Provider: Office   I discussed the limitations of evaluation and management by telemedicine and the availability of in person appointments. The patient expressed understanding and agreed to proceed.  History of Present Illness: Hospital F/up: Admitted: 09/23/2018 Discharged:10/04/2018 This was 1 of 4 other hospital visits for generalized weakness, fluid overload and respiratory distress. One of her hospital visit was due to subdural hematoma from fall at home 09/06/2018. Her hospital visits were complicated by hypoxic respiratory failure. She was dialyzed and discharged home with family. During each hospital visit, the need for SNF and goal of care was addressed. Her code status was changed to DNR, but she declined admission to long term care facility and hospice services. Today she is at home with son and daughter. She denies any acute compliant. Joan Mosley denies any skin breakdown or fall since discharge. Unable to get weight due to unsteady gait and generalized weakness.  She had hemodialysis yesterday and scheduled for tomorrow. Her normal schedule is Tues, Thurs, and Sat.Home health with [facility] is to resume 1-2x/week No appt with pulmonology No medication refill needed at this time.  HTN: BP Readings from Last 3 Encounters:  10/14/18 (!) 99/54  10/04/18 124/77  09/16/18 121/73   DM: Today's glucose of 85 per Joan Mosley. asymptomatic Did not administer humalog. She did administer levemir this morning  Observations/Objective:  Physical Exam  Pulmonary/Chest: Effort normal.  Neurological: She is alert.  Skin: She is not diaphoretic.  Vitals reviewed. limited exam due to video appointment  Assessment and Plan: Shiralee was  seen today for hospitalization follow-up.  Diagnoses and all orders for this visit:  Type 2 diabetes mellitus with diabetic polyneuropathy, with long-term current use of insulin ([place]) -     Basic metabolic panel; Future  Stage 5 chronic kidney disease on chronic dialysis [facility]) -     Basic metabolic panel; Future -     CBC; Future  ESRD (end stage renal disease) ([place])  Anxiety  Hypothyroidism, unspecified type   Follow Up Instructions: See avs Please have dialysis center draw CBC and BMP tomorrow. They can fax lab results to [phone].  Also inquire about dry weight after dialysis tomorrow. Do not give clonazepam if lethargy or increased somnolence. Do not administer any insulin if glucose <90. Maintain adequate skin care to avoid pressure ulcer  I discussed the assessment and treatment plan with the patient. The patient was provided an opportunity to ask questions and all were answered. The patient agreed with the plan and demonstrated an understanding of the instructions.   The patient was advised to call back or seek an in-person evaluation if the symptoms worsen or if the condition fails to improve as anticipated.  Joan Lacy, NP

## 2018-10-14 NOTE — Patient Instructions (Addendum)
Please have dialysis center draw CBC and BMP tomorrow. They can fax lab results to [phone].  Also inquire about dry weight after dialysis tomorrow.  Do not give clonazepam if lethargy or increased somnolence.  Do not administer any insulin if glucose <90.  Maintain adequate skin care to avoid pressure ulcer  Preventing Pressure Injuries  A pressure injury, sometimes called a bedsore or a pressure ulcer, is an injury to the skin and underlying tissue caused by pressure. A pressure injury can happen when your skin presses against a surface, such as a mattress or wheelchair seat, for too long. The pressure on the blood vessels causes reduced blood flow to your skin. This can eventually cause the skin tissue to die and break down into a wound. Pressure injuries usually develop:  Over bony parts of the body, such as the tailbone, shoulders, elbows, hips, and heels.  Under medical devices, such as respiratory equipment, stockings, tubes, and splints. How can this condition affect me? Pressure injuries are caused by a lack of blood supply to an area of skin. These injuries begin as a reddened area on the skin and can become an open sore. They can result from intense pressure over a short period of time or from less pressure over a long period of time. Pressure injuries can vary in severity. They can cause pain, muscle damage, and infection. What can increase my risk? This condition is more likely to develop in people who:  Are in the hospital or an extended care facility.  Are bedridden or in a wheelchair.  Have an injury or disease that keeps them from: ? Moving normally. ? Feeling pain or pressure. ? Communicating if they feel pain or pressure.  Have a condition that: ? Makes them sleepy or less alert. ? Causes poor blood flow.  Need to wear a medical device.  Have poor control of their bladder or bowel functions (incontinence).  Have poor nutrition (malnutrition).  Have had  this condition before.  Are of certain ethnicities. People of African American, Latino, or Hispanic descent are at higher risk compared to other ethnic groups. What actions can I take to prevent pressure injuries? Reducing and redistributing pressure  Do not lie or sit in one position for a long time. Move or change position: ? Every hour when out of bed in a chair. ? Every two hours when in bed. ? As often as told by your health care provider.  Use pillows, wedges, or cushions to redistribute pressure. Ask your health care provider to recommend a mattress, cushions, or pads for you.  Use medical devices that do not rub your skin. Tell your health care provider if one of your medical devices is causing pain or irritation. Skin care If you are in the hospital, your health care providers:  Will inspect your skin, including areas under or around medical devices, at least twice a day.  May recommend that you use certain types of bedding to help prevent pressure injuries. These may include a pad, mattress, or chair cushion that is filled with gel, air, water, or foam.  Will evaluate your nutrition and consult a dietitian if needed.  Will inspect and change any wound dressings regularly.  May help you move into different positions every few hours.  Will adjust any medical devices and braces as needed to limit pressure on your skin.  Will keep your skin clean and dry.  May use gentle cleansers and skin protectants if you are incontinent.  Will  moisturize any dry skin. In general, at home:  Keep your skin clean and dry. Gently pat your skin dry.  Do not rub or massage bony areas of your skin.  Moisturize dry skin.  Use gentle cleansers and skin protectants routinely if you are incontinent.  Check your skin at least once a day for any changes in color and for any new blisters or sores. Make sure to check under and around any medical devices and between skin folds. Have a caregiver  do this for you if you are not able.  Lifestyle  Be as active as you can every day. Ask your health care provider to suggest safe exercises or activities.  Do not abuse drugs or alcohol.  Do not use any products that contain nicotine or tobacco, such as cigarettes, e-cigarettes, and chewing tobacco. If you need help quitting, ask your health care provider. General instructions   Take over-the-counter and prescription medicines only as told by your health care provider.  Work with your health care provider to manage any chronic health conditions.  Eat a healthy diet that includes protein, vitamins, and minerals. Ask your health care provider what types of food you should eat.  Drink enough fluid to keep your urine pale yellow.  Keep all follow-up visits as told by your health care provider. This is important. Contact a health care provider if you:  Feel or see any changes in your skin. Summary  A pressure injury, sometimes called a bedsore or a pressure ulcer, is an injury to the skin and underlying tissue caused by pressure.  Do not lie or sit in one position for a long time.  Check your skin at least once a day for any changes in color and for any new blisters or sores.  Make sure to check under and around any medical devices and between skin folds. Have a caregiver do this for you if you are not able.  Eat a healthy diet that includes protein, vitamins, and minerals. Ask your health care provider what types of food you should eat. This information is not intended to replace advice given to you by your health care provider. Make sure you discuss any questions you have with your health care provider. Document Released: 02/20/2004 Document Revised: 05/06/2018 Document Reviewed: 10/05/2017 Elsevier Patient Education  2020 [employer].

## 2018-10-20 ENCOUNTER — Telehealth: Payer: Self-pay | Admitting: Nurse Practitioner

## 2018-10-20 NOTE — Telephone Encounter (Signed)
[name] please advise 

## 2018-10-20 NOTE — Telephone Encounter (Signed)
Joan Mosley is aware, she also mention they might be signing the pt up for another 60 days since she just got out from the hospital and PT or OT ordered from the hospital as well.  FYI--

## 2018-10-20 NOTE — Telephone Encounter (Signed)
Ok to resume.

## 2018-10-20 NOTE — Telephone Encounter (Signed)
lori rn w/[facility] is calling and needs verbal orders to resume care. Pt just got out of [facility]

## 2018-10-21 ENCOUNTER — Telehealth: Payer: Self-pay | Admitting: Nurse Practitioner

## 2018-10-21 NOTE — Telephone Encounter (Signed)
ok 

## 2018-10-21 NOTE — Telephone Encounter (Signed)
Don is aware.

## 2018-10-21 NOTE — Telephone Encounter (Signed)
Home Health Verbal Orders - Caller/Agency: Don with [facility] Number: [phone], [place] to leave a message Requesting OT/PT/Skilled Nursing/Social Work/Speech Therapy: OT Frequency: evaluate next Wednesday per caregiver request.

## 2018-11-10 ENCOUNTER — Emergency Department (HOSPITAL_COMMUNITY): Payer: Medicare Other

## 2018-11-10 ENCOUNTER — Encounter (HOSPITAL_COMMUNITY): Payer: Self-pay | Admitting: Emergency Medicine

## 2018-11-10 ENCOUNTER — Other Ambulatory Visit: Payer: Self-pay

## 2018-11-10 ENCOUNTER — Inpatient Hospital Stay (HOSPITAL_COMMUNITY)
Admission: EM | Admit: 2018-11-10 | Discharge: 2018-11-22 | DRG: 314 | Disposition: A | Discharge status: Other Acute Inpatient Hospital | Payer: Medicare Other | Attending: Internal Medicine | Admitting: Internal Medicine

## 2018-11-10 DIAGNOSIS — Z87891 Personal history of nicotine dependence: Secondary | ICD-10-CM

## 2018-11-10 DIAGNOSIS — Z79899 Other long term (current) drug therapy: Secondary | ICD-10-CM

## 2018-11-10 DIAGNOSIS — N186 End stage renal disease: Secondary | ICD-10-CM

## 2018-11-10 DIAGNOSIS — G459 Transient cerebral ischemic attack, unspecified: Secondary | ICD-10-CM

## 2018-11-10 DIAGNOSIS — K5900 Constipation, unspecified: Secondary | ICD-10-CM | POA: Diagnosis present

## 2018-11-10 DIAGNOSIS — T82898A Other specified complication of vascular prosthetic devices, implants and grafts, initial encounter: Secondary | ICD-10-CM

## 2018-11-10 DIAGNOSIS — Z882 Allergy status to sulfonamides status: Secondary | ICD-10-CM

## 2018-11-10 DIAGNOSIS — Z79890 Hormone replacement therapy (postmenopausal): Secondary | ICD-10-CM

## 2018-11-10 DIAGNOSIS — Z7189 Other specified counseling: Secondary | ICD-10-CM

## 2018-11-10 DIAGNOSIS — J9622 Acute and chronic respiratory failure with hypercapnia: Secondary | ICD-10-CM

## 2018-11-10 DIAGNOSIS — Z9981 Dependence on supplemental oxygen: Secondary | ICD-10-CM

## 2018-11-10 DIAGNOSIS — I2721 Secondary pulmonary arterial hypertension: Secondary | ICD-10-CM | POA: Diagnosis present

## 2018-11-10 DIAGNOSIS — Z8673 Personal history of transient ischemic attack (TIA), and cerebral infarction without residual deficits: Secondary | ICD-10-CM

## 2018-11-10 DIAGNOSIS — R0902 Hypoxemia: Secondary | ICD-10-CM

## 2018-11-10 DIAGNOSIS — I50810 Right heart failure, unspecified: Secondary | ICD-10-CM | POA: Diagnosis present

## 2018-11-10 DIAGNOSIS — D631 Anemia in chronic kidney disease: Secondary | ICD-10-CM | POA: Diagnosis present

## 2018-11-10 DIAGNOSIS — E1140 Type 2 diabetes mellitus with diabetic neuropathy, unspecified: Secondary | ICD-10-CM | POA: Diagnosis present

## 2018-11-10 DIAGNOSIS — J449 Chronic obstructive pulmonary disease, unspecified: Secondary | ICD-10-CM | POA: Diagnosis present

## 2018-11-10 DIAGNOSIS — Z8249 Family history of ischemic heart disease and other diseases of the circulatory system: Secondary | ICD-10-CM

## 2018-11-10 DIAGNOSIS — J9621 Acute and chronic respiratory failure with hypoxia: Secondary | ICD-10-CM

## 2018-11-10 DIAGNOSIS — T8241XA Breakdown (mechanical) of vascular dialysis catheter, initial encounter: Secondary | ICD-10-CM

## 2018-11-10 DIAGNOSIS — Z91013 Allergy to seafood: Secondary | ICD-10-CM

## 2018-11-10 DIAGNOSIS — F418 Other specified anxiety disorders: Secondary | ICD-10-CM

## 2018-11-10 DIAGNOSIS — Z6841 Body Mass Index (BMI) 40.0 and over, adult: Secondary | ICD-10-CM

## 2018-11-10 DIAGNOSIS — E1122 Type 2 diabetes mellitus with diabetic chronic kidney disease: Secondary | ICD-10-CM | POA: Diagnosis present

## 2018-11-10 DIAGNOSIS — Z09 Encounter for follow-up examination after completed treatment for conditions other than malignant neoplasm: Secondary | ICD-10-CM

## 2018-11-10 DIAGNOSIS — E875 Hyperkalemia: Secondary | ICD-10-CM | POA: Diagnosis present

## 2018-11-10 DIAGNOSIS — Z992 Dependence on renal dialysis: Secondary | ICD-10-CM

## 2018-11-10 DIAGNOSIS — Z66 Do not resuscitate: Secondary | ICD-10-CM | POA: Diagnosis not present

## 2018-11-10 DIAGNOSIS — R0602 Shortness of breath: Secondary | ICD-10-CM

## 2018-11-10 DIAGNOSIS — Z885 Allergy status to narcotic agent status: Secondary | ICD-10-CM

## 2018-11-10 DIAGNOSIS — Z794 Long term (current) use of insulin: Secondary | ICD-10-CM

## 2018-11-10 DIAGNOSIS — E8889 Other specified metabolic disorders: Secondary | ICD-10-CM | POA: Diagnosis present

## 2018-11-10 DIAGNOSIS — E890 Postprocedural hypothyroidism: Secondary | ICD-10-CM | POA: Diagnosis present

## 2018-11-10 DIAGNOSIS — N2581 Secondary hyperparathyroidism of renal origin: Secondary | ICD-10-CM | POA: Diagnosis present

## 2018-11-10 DIAGNOSIS — Z96653 Presence of artificial knee joint, bilateral: Secondary | ICD-10-CM | POA: Diagnosis present

## 2018-11-10 DIAGNOSIS — J441 Chronic obstructive pulmonary disease with (acute) exacerbation: Secondary | ICD-10-CM | POA: Diagnosis not present

## 2018-11-10 DIAGNOSIS — Y838 Other surgical procedures as the cause of abnormal reaction of the patient, or of later complication, without mention of misadventure at the time of the procedure: Secondary | ICD-10-CM | POA: Diagnosis present

## 2018-11-10 DIAGNOSIS — Z880 Allergy status to penicillin: Secondary | ICD-10-CM

## 2018-11-10 DIAGNOSIS — H9190 Unspecified hearing loss, unspecified ear: Secondary | ICD-10-CM | POA: Diagnosis present

## 2018-11-10 DIAGNOSIS — I132 Hypertensive heart and chronic kidney disease with heart failure and with stage 5 chronic kidney disease, or end stage renal disease: Secondary | ICD-10-CM | POA: Diagnosis present

## 2018-11-10 DIAGNOSIS — Z86711 Personal history of pulmonary embolism: Secondary | ICD-10-CM

## 2018-11-10 DIAGNOSIS — Z20828 Contact with and (suspected) exposure to other viral communicable diseases: Secondary | ICD-10-CM | POA: Diagnosis present

## 2018-11-10 DIAGNOSIS — E039 Hypothyroidism, unspecified: Secondary | ICD-10-CM

## 2018-11-10 DIAGNOSIS — Z881 Allergy status to other antibiotic agents status: Secondary | ICD-10-CM

## 2018-11-10 DIAGNOSIS — Z86718 Personal history of other venous thrombosis and embolism: Secondary | ICD-10-CM

## 2018-11-10 DIAGNOSIS — J9811 Atelectasis: Secondary | ICD-10-CM | POA: Diagnosis present

## 2018-11-10 DIAGNOSIS — R413 Other amnesia: Secondary | ICD-10-CM | POA: Diagnosis not present

## 2018-11-10 DIAGNOSIS — E785 Hyperlipidemia, unspecified: Secondary | ICD-10-CM

## 2018-11-10 DIAGNOSIS — E1129 Type 2 diabetes mellitus with other diabetic kidney complication: Secondary | ICD-10-CM | POA: Diagnosis present

## 2018-11-10 DIAGNOSIS — Z515 Encounter for palliative care: Secondary | ICD-10-CM

## 2018-11-10 DIAGNOSIS — T82858A Stenosis of vascular prosthetic devices, implants and grafts, initial encounter: Principal | ICD-10-CM | POA: Diagnosis present

## 2018-11-10 DIAGNOSIS — E8770 Fluid overload, unspecified: Secondary | ICD-10-CM | POA: Diagnosis present

## 2018-11-10 DIAGNOSIS — Z90710 Acquired absence of both cervix and uterus: Secondary | ICD-10-CM

## 2018-11-10 DIAGNOSIS — J690 Pneumonitis due to inhalation of food and vomit: Secondary | ICD-10-CM | POA: Diagnosis not present

## 2018-11-10 LAB — POCT I-STAT EG7
Acid-Base Excess: 3 mmol/L — ABNORMAL HIGH (ref 0.0–2.0)
Bicarbonate: 29.7 mmol/L — ABNORMAL HIGH (ref 20.0–28.0)
Calcium, Ion: 1.01 mmol/L — ABNORMAL LOW (ref 1.15–1.40)
HCT: 30 % — ABNORMAL LOW (ref 36.0–46.0)
Hemoglobin: 10.2 g/dL — ABNORMAL LOW (ref 12.0–15.0)
O2 Saturation: 70 %
Potassium: 5.1 mmol/L (ref 3.5–5.1)
Sodium: 135 mmol/L (ref 135–145)
TCO2: 31 mmol/L (ref 22–32)
pCO2, Ven: 57.5 mmHg (ref 44.0–60.0)
pH, Ven: 7.322 (ref 7.250–7.430)
pO2, Ven: 40 mmHg (ref 32.0–45.0)

## 2018-11-10 LAB — BASIC METABOLIC PANEL
Anion gap: 16 — ABNORMAL HIGH (ref 5–15)
BUN: 37 mg/dL — ABNORMAL HIGH (ref 8–23)
CO2: 27 mmol/L (ref 22–32)
Calcium: 8.3 mg/dL — ABNORMAL LOW (ref 8.9–10.3)
Chloride: 94 mmol/L — ABNORMAL LOW (ref 98–111)
Creatinine, Ser: 9.43 mg/dL — ABNORMAL HIGH (ref 0.44–1.00)
GFR calc Af Amer: 4 mL/min — ABNORMAL LOW (ref >60)
GFR calc non Af Amer: 4 mL/min — ABNORMAL LOW (ref >60)
Glucose, Bld: 97 mg/dL (ref 70–99)
Potassium: 5.2 mmol/L — ABNORMAL HIGH (ref 3.5–5.1)
Sodium: 137 mmol/L (ref 135–145)

## 2018-11-10 LAB — CBC WITH DIFFERENTIAL/PLATELET
Abs Immature Granulocytes: 0.07 10*3/uL (ref 0.00–0.07)
Basophils Absolute: 0.1 10*3/uL (ref 0.0–0.1)
Basophils Relative: 0 %
Eosinophils Absolute: 0.2 10*3/uL (ref 0.0–0.5)
Eosinophils Relative: 1 %
HCT: 31.2 % — ABNORMAL LOW (ref 36.0–46.0)
Hemoglobin: 9.3 g/dL — ABNORMAL LOW (ref 12.0–15.0)
Immature Granulocytes: 1 %
Lymphocytes Relative: 10 %
Lymphs Abs: 1.1 10*3/uL (ref 0.7–4.0)
MCH: 30.6 pg (ref 26.0–34.0)
MCHC: 29.8 g/dL — ABNORMAL LOW (ref 30.0–36.0)
MCV: 102.6 fL — ABNORMAL HIGH (ref 80.0–100.0)
Monocytes Absolute: 0.8 10*3/uL (ref 0.1–1.0)
Monocytes Relative: 7 %
Neutro Abs: 9.5 10*3/uL — ABNORMAL HIGH (ref 1.7–7.7)
Neutrophils Relative %: 81 %
Platelets: 180 10*3/uL (ref 150–400)
RBC: 3.04 MIL/uL — ABNORMAL LOW (ref 3.87–5.11)
RDW: 17.8 % — ABNORMAL HIGH (ref 11.5–15.5)
WBC: 11.7 10*3/uL — ABNORMAL HIGH (ref 4.0–10.5)
nRBC: 0 % (ref 0.0–0.2)

## 2018-11-10 LAB — BRAIN NATRIURETIC PEPTIDE: B Natriuretic Peptide: 157.1 pg/mL — ABNORMAL HIGH (ref 0.0–100.0)

## 2018-11-10 MED ORDER — IOHEXOL 350 MG/ML SOLN
100.0000 mL | Freq: Once | INTRAVENOUS | Status: AC | PRN
  Administered 2018-11-10: 100 mL via INTRAVENOUS

## 2018-11-10 MED ORDER — SODIUM ZIRCONIUM CYCLOSILICATE 10 G PO PACK
10.0000 g | PACK | Freq: Once | ORAL | Status: AC
  Administered 2018-11-10: 18:00:00 10 g via ORAL
  Filled 2018-11-10: qty 1

## 2018-11-10 MED ORDER — IPRATROPIUM-ALBUTEROL 0.5-2.5 (3) MG/3ML IN SOLN
3.0000 mL | Freq: Once | RESPIRATORY_TRACT | Status: AC
  Administered 2018-11-10: 18:00:00 3 mL via RESPIRATORY_TRACT
  Filled 2018-11-10: qty 3

## 2018-11-10 MED ORDER — METHYLPREDNISOLONE SODIUM SUCC 125 MG IJ SOLR
125.0000 mg | Freq: Once | INTRAMUSCULAR | Status: AC
  Administered 2018-11-11: 125 mg via INTRAVENOUS
  Filled 2018-11-10: qty 2

## 2018-11-10 MED ORDER — CHLORHEXIDINE GLUCONATE CLOTH 2 % EX PADS
6.0000 | MEDICATED_PAD | Freq: Every day | CUTANEOUS | Status: DC
  Administered 2018-11-12 – 2018-11-14 (×2): 6 via TOPICAL

## 2018-11-10 MED ORDER — ONDANSETRON HCL 4 MG/2ML IJ SOLN
4.0000 mg | Freq: Once | INTRAMUSCULAR | Status: AC
  Administered 2018-11-10: 23:00:00 4 mg via INTRAVENOUS
  Filled 2018-11-10: qty 2

## 2018-11-10 NOTE — ED Provider Notes (Signed)
[facility] EMERGENCY DEPARTMENT Provider Note   CSN: [id number] Arrival date & time: 11/10/18  1548     History   Chief Complaint Chief Complaint  Patient presents with  . Cough    HPI Joan Mosley is a 73 y.o. female.     The patient has a history of COPD (on 6L O2) who presents to the ED for a cough. She describes a non-productive cough without chest pain. She endorses mild wheezing. No fevers or chills. She does not specifically endorse dyspnea worse than her baseline. She endorses a hacking cough for the past two days that is mildly productive of sputum. She is concerned she may have pneumonia. She has been taking an Albuterol nebulizer and cough suppressants which have helped. She denies a history of DVT/PE. She has been hospitalized for COPD exacerbations in the past but this feels different.  The patient also endorses a problem with her HD access. She normally goes to dialysis [facility] and access is obtained via a right chest wall PermCath. She last had dialysis on Tuesday. She is due today but was told her port was clotted. She endorses generalized weakness. No heart palpitations.  The history is provided by the patient.  Cough Cough characteristics:  Hacking Sputum characteristics:  Unable to specify Severity:  Mild Duration:  2 days Timing:  Intermittent Progression:  Unchanged Chronicity:  New Context: not sick contacts and not upper respiratory infection   Relieved by:  Cough suppressants and home nebulizer Worsened by:  Deep breathing Associated symptoms: wheezing   Associated symptoms: no chest pain, no chills, no fever, no headaches, no rhinorrhea, no shortness of breath, no sinus congestion and no sore throat     Past Medical History:  Diagnosis Date  . Arthritis   . COPD (chronic obstructive pulmonary disease) ([place])   . Diabetes mellitus without complication ([place])   . Diabetic neuropathy ([place])   . Oxygen dependent 03/30/2018   5L  home O2  . Renal disorder    ESRD  . Thyroid disease   . TIA (transient ischemic attack)     Patient Active Problem List   Diagnosis Date Noted  . COPD exacerbation ([place]) 11/11/2018  . TIA (transient ischemic attack)   . HLD (hyperlipidemia)   . Type II diabetes mellitus with renal manifestations ([place])   . Depression with anxiety   . COPD with acute exacerbation ([place]) 09/30/2018  . Acute respiratory failure with hypoxia ([place]) 09/24/2018  . Advanced care planning/counseling discussion   . Goals of care, counseling/discussion   . Palliative care by specialist   . Acute on chronic respiratory failure with hypoxia and hypercapnia ([place]) 09/15/2018  . Pulmonary edema 09/08/2018  . Respiratory failure ([place]) 09/08/2018  . Weakness   . SDH (subdural hematoma) ([place]) 09/06/2018  . Chronic respiratory failure with hypoxia, on home O2 therapy ([place]) 09/06/2018  . Subdural hematoma ([place]) 08/22/2018  . Elevated troponin 08/22/2018  . ESRD (end stage renal disease) ([place]) 08/22/2018  . Anxiety 07/04/2018  . Counseling regarding advanced directives and goals of care 07/01/2018  . Full code status 07/01/2018  . Drug-induced constipation 07/01/2018  . Hemodialysis-associated hypotension 07/01/2018  . Dialysis patient ([place]) 07/01/2018  . HOH (hard of hearing) 07/01/2018  . Stage 5 chronic kidney disease on chronic dialysis ([place]) 07/01/2018  . Type 2 diabetes mellitus with diabetic polyneuropathy, with long-term current use of insulin ([place]) 07/01/2018  . Hypothyroidism 07/01/2018  . Gait instability 07/01/2018  . Hyperkalemia 04/20/2018  Past Surgical History:  Procedure Laterality Date  . ABDOMINAL HYSTERECTOMY    . RECTOCELE REPAIR    . REPLACEMENT TOTAL KNEE BILATERAL Bilateral 2008  . THYROIDECTOMY     80%  . VAGINAL WOUND CLOSURE / REPAIR       OB History   No obstetric history on file.      Home Medications    Prior to Admission medications   Medication Sig Start Date  End Date Taking? Authorizing Provider  acetaminophen (TYLENOL) 500 MG tablet Take 1,000 mg by mouth every 6 (six) hours as needed for mild pain.    [provider]  albuterol (PROVENTIL) (2.5 MG/3ML) 0.083% nebulizer solution Take 3 mLs (2.5 mg total) by nebulization 4 (four) times daily. 09/16/18   Mariel Aloe, MD  albuterol (VENTOLIN HFA) 108 (90 Base) MCG/ACT inhaler Inhale 2 puffs into the lungs every 6 (six) hours as needed for wheezing or shortness of breath.    [provider]  calcitRIOL (ROCALTROL) 0.25 MCG capsule Take 0.25 mcg by mouth daily.    [provider]  clonazePAM (KLONOPIN) 1 MG tablet Take 1 tablet (1 mg total) by mouth 2 (two) times daily as needed for up to 10 days for anxiety. Change in dose 10/04/18 10/14/18  Darliss Cheney, MD  FLUoxetine (PROZAC) 10 MG tablet Take 1 tablet (10 mg total) by mouth daily. 08/08/18   Nche, Charlene Brooke, NP  furosemide (LASIX) 80 MG tablet Take 80 mg by mouth See admin instructions. Take 80mg  by mouth on SUN MON WED FRI    [provider]  insulin detemir (LEVEMIR) 100 UNIT/ML injection Inject 0.15 mLs (15 Units total) into the skin daily. 09/16/18   Mariel Aloe, MD  insulin lispro (HUMALOG) 100 UNIT/ML injection Inject 1-5 Units into the skin 3 (three) times daily with meals as needed for high blood sugar. Sliding scale     [provider]  ipratropium (ATROVENT) 0.02 % nebulizer solution Inhale 0.5 mg into the lungs 4 (four) times daily.     [provider]  levothyroxine (SYNTHROID) 112 MCG tablet Take 1 tablet (112 mcg total) by mouth daily before breakfast. 1 AM Patient taking differently: Take 112 mcg by mouth daily before breakfast.  07/04/18   Nche, Charlene Brooke, NP  lidocaine (LIDODERM) 5 % Place 1 patch onto the skin every 12 (twelve) hours as needed (back pain). Remove & Discard patch within 12 hours or as directed by MD     [provider]  midodrine (PROAMATINE) 10 MG  tablet Take 10 mg by mouth 3 (three) times daily. Taking on Dialysis days, Tues, Thur, Sat.    [provider]  montelukast (SINGULAIR) 10 MG tablet Take 10 mg by mouth at bedtime. PM    [provider]  nystatin (MYCOSTATIN/NYSTOP) powder Apply 1 g topically 2 (two) times daily as needed (skin rash).     [provider]  nystatin cream (MYCOSTATIN) Apply 1 application topically 2 (two) times daily as needed for dry skin.     [provider]  rosuvastatin (CRESTOR) 40 MG tablet Take 40 mg by mouth daily. AM    [provider]  senna-docusate (SENNA PLUS) 8.6-50 MG tablet Take 1 tablet by mouth at bedtime. 07/04/18   Nche, Charlene Brooke, NP  sevelamer (RENAGEL) 800 MG tablet Take 1,600 mg by mouth 3 (three) times daily with meals. 1600 mg --3 times daily--take additional 1-2 if eats snack    [provider]    Family History Family History  Problem Relation Age of Onset  . Heart disease Mother   . Hypertension Mother   . Heart disease Father   . Hypertension Father     Social History Social History   Tobacco Use  . Smoking status: Former Smoker    Packs/day: 1.00    Years: 35.00    Pack years: 35.00    Quit date: 03/29/1981    Years since quitting: 37.6  . Smokeless tobacco: Never Used  Substance Use Topics  . Alcohol use: Never    Frequency: Never  . Drug use: Never     Allergies   Avelox [moxifloxacin hcl in nacl], Codeine, Penicillins, Sulfa antibiotics, and Shellfish allergy   Review of Systems Review of Systems  Constitutional: Negative for chills and fever.  HENT: Negative for rhinorrhea and sore throat.   Respiratory: Positive for cough and wheezing. Negative for shortness of breath.   Cardiovascular: Negative for chest pain and palpitations.  Gastrointestinal: Negative for abdominal pain, diarrhea, nausea and vomiting.  Neurological: Positive for weakness. Negative for headaches.  All other systems reviewed and  are negative.    Physical Exam Updated Vital Signs BP 133/74   Pulse 90   Temp 98.7 F (37.1 C) (Oral)   Resp 17   Ht 5\' 1"  (1.549 m)   Wt 113.4 kg   SpO2 91%   BMI 47.24 kg/m   Physical Exam Vitals signs and nursing note reviewed.  Constitutional:      General: She is not in acute distress.    Appearance: She is well-developed.  HENT:     Head: Normocephalic and atraumatic.  Eyes:     Conjunctiva/sclera: Conjunctivae normal.  Neck:     Musculoskeletal: Neck supple.  Cardiovascular:     Rate and Rhythm: Normal rate and regular rhythm.  Pulmonary:     Effort: Pulmonary effort is normal.     Breath sounds: Wheezing present.     Comments: Inspiratory and expiratory wheezing present bilaterally Abdominal:     Palpations: Abdomen is soft.     Tenderness: There is no abdominal tenderness.  Skin:    General: Skin is warm and dry.  Neurological:     General: No focal deficit present.     Mental Status: She is alert and oriented to person, place, and time.      ED Treatments / Results  Labs (all labs ordered are listed, but only abnormal results are displayed) Labs Reviewed  BASIC METABOLIC PANEL - Abnormal; Notable for the following components:      Result Value   Potassium 5.2 (*)    Chloride 94 (*)    BUN 37 (*)    Creatinine, Ser 9.43 (*)    Calcium 8.3 (*)    GFR calc non Af Amer 4 (*)    GFR calc Af Amer 4 (*)    Anion gap 16 (*)    All other components within normal limits  CBC WITH DIFFERENTIAL/PLATELET - Abnormal; Notable for the following components:   WBC 11.7 (*)    RBC 3.04 (*)    Hemoglobin 9.3 (*)    HCT 31.2 (*)    MCV 102.6 (*)    MCHC 29.8 (*)    RDW 17.8 (*)    Neutro Abs 9.5 (*)    All other components within normal limits  BRAIN NATRIURETIC PEPTIDE - Abnormal; Notable for the following components:   B Natriuretic Peptide 157.1 (*)  All other components within normal limits  TSH - Abnormal; Notable for the following components:    TSH 6.010 (*)    All other components within normal limits  POCT I-STAT EG7 - Abnormal; Notable for the following components:   Bicarbonate 29.7 (*)    Acid-Base Excess 3.0 (*)    Calcium, Ion 1.01 (*)    HCT 30.0 (*)    Hemoglobin 10.2 (*)    All other components within normal limits  CBG MONITORING, ED - Abnormal; Notable for the following components:   Glucose-Capillary 113 (*)    All other components within normal limits  POCT I-STAT 7, (LYTES, BLD GAS, ICA,H+H) - Abnormal; Notable for the following components:   pH, Arterial 7.236 (*)    pCO2 arterial 65.5 (*)    pO2, Arterial 62.0 (*)    Sodium 129 (*)    Potassium 6.6 (*)    Calcium, Ion 1.05 (*)    HCT 31.0 (*)    Hemoglobin 10.5 (*)    All other components within normal limits  SARS CORONAVIRUS 2 (TAT 6-24 HRS)  LACTIC ACID, PLASMA  BLOOD GAS, ARTERIAL  I-STAT VENOUS BLOOD GAS, ED    EKG EKG Interpretation  Date/Time:  Thursday November 10 2018 15:54:32 EDT Ventricular Rate:  100 PR Interval:    QRS Duration: 95 QT Interval:  358 QTC Calculation: 462 R Axis:   47 Text Interpretation:  Sinus tachycardia similar morphology to previously Confirmed by Merrily Pew [phone]) on 11/10/2018 5:01:08 PM   Radiology Dg Chest 2 View  Result Date: 11/10/2018 CLINICAL DATA:  Nonproductive cough for 3 days. COPD. EXAM: CHEST - 2 VIEW COMPARISON:  09/24/2018 FINDINGS: Right jugular dual-lumen central venous catheter remains in appropriate position. Heart size is stable. Mild atelectasis or scarring is seen at both lung bases, without significant change. No evidence of pulmonary consolidation or pleural effusion. IMPRESSION: Stable mild bibasilar atelectasis or scarring. Electronically Signed   By: Marlaine Hind M.D.   On: 11/10/2018 18:45   Ct Angio Chest Pe W And/or Wo Contrast  Result Date: 11/10/2018 CLINICAL DATA:  PE suspected, low pretest probability EXAM: CT ANGIOGRAPHY CHEST WITH CONTRAST TECHNIQUE: Multidetector CT  imaging of the chest was performed using the standard protocol during bolus administration of intravenous contrast. Multiplanar CT image reconstructions and MIPs were obtained to evaluate the vascular anatomy. CONTRAST:  159mL OMNIPAQUE IOHEXOL 350 MG/ML SOLN COMPARISON:  CTA chest 09/11/2018 FINDINGS: Cardiovascular: Satisfactory opacification of the pulmonary arteries to the segmental level. No pulmonary arterial filling defects are visualized. Central pulmonary arterial enlargement is similar to prior exam and likely reflects chronic pulmonary artery hypertension. Suboptimal opacification of the thoracic aorta. Four vessel branching of the arch with the left vertebral artery arising directly from the arch. Atherosclerotic calcification is seen throughout the imaged aorta. Mild cardiomegaly with right atrial enlargement and reflux of contrast into the hepatic veins. Coronary artery calcifications are present. Aortic leaflet calcifications are seen as well. Mediastinum/Nodes: Shotty mediastinal adenopathy without enlarged axillary, hilar or mediastinal lymph nodes. Possibly reactive. Secretions are noted in the trachea with posterior bowing reflecting imaging during exhalation. No acute abnormality of the thoracic esophagus. 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 Remaining portions of the thoracic inlet are unremarkable. Lungs/Pleura: Bandlike areas of opacity in the lower lobes likely reflect areas of chronic atelectasis and/or scarring overall similar to the comparison study. Some increasing atelectatic changes noted in the right middle lobe. Centrilobular and paraseptal emphysema is present  throughout the lungs most pronounced in the lung apices. Additional scarring and fibrosis is noted in the upper lungs with right apical bullous disease as well. Upper Abdomen: No acute abnormalities present in the visualized portions of the upper abdomen. Musculoskeletal: Multilevel  degenerative changes are present in the imaged portions of the spine. Multilevel flowing anterior osteophytosis compatible features of diffuse idiopathic skeletal hyperostosis (DISH). Additional degenerative changes the shoulders. Rightward curvature of the spine is likely positional. No acute osseous abnormality or suspicious osseous lesion. Review of the MIP images confirms the above findings. IMPRESSION: 1. No evidence of pulmonary embolism. 2. Chronic pulmonary artery hypertension. 3. Cardiomegaly with right atrial enlargement and reflux of contrast into the hepatic veins, suggestive of right heart failure. 4. Volume loss with consolidation in the lung bases and right middle lobe has an appearance most compatible with subsegmental atelectasis and/or scarring. 5. Biapical fibrotic changes. 6. Aortic Atherosclerosis (ICD10-I70.0) 7. Emphysema (ICD10-J43.9). Electronically Signed   By: Lovena Le M.D.   On: 11/10/2018 22:20    Procedures Procedures (including critical care time)  Medications Ordered in ED Medications  Chlorhexidine Gluconate Cloth 2 % PADS 6 each (has no administration in time range)  pentafluoroprop-tetrafluoroeth (GEBAUERS) aerosol 1 application (has no administration in time range)  lidocaine (PF) (XYLOCAINE) 1 % injection 5 mL (has no administration in time range)  lidocaine-prilocaine (EMLA) cream 1 application (has no administration in time range)  0.9 %  sodium chloride infusion (has no administration in time range)  0.9 %  sodium chloride infusion (has no administration in time range)  heparin injection 1,000 Units (has no administration in time range)  alteplase (CATHFLO ACTIVASE) injection 2 mg (has no administration in time range)  heparin injection 2,300 Units (has no administration in time range)  insulin aspart (novoLOG) injection 0-9 Units (has no administration in time range)  ipratropium (ATROVENT) nebulizer solution 0.5 mg (0.5 mg Inhalation Given 11/11/18  0536)  levalbuterol (XOPENEX) nebulizer solution 0.63 mg (has no administration in time range)  dextromethorphan-guaiFENesin (MUCINEX DM) 30-600 MG per 12 hr tablet 1 tablet (has no administration in time range)  methylPREDNISolone sodium succinate (SOLU-MEDROL) 125 mg/2 mL injection 60 mg (60 mg Intravenous Given 11/11/18 0541)  heparin injection 5,000 Units (5,000 Units Subcutaneous Given 11/11/18 0538)  ondansetron (ZOFRAN) injection 4 mg (has no administration in time range)  acetaminophen (TYLENOL) tablet 650 mg (650 mg Oral Given 11/11/18 0623)  furosemide (LASIX) tablet 80 mg (has no administration in time range)  rosuvastatin (CRESTOR) tablet 40 mg (has no administration in time range)  FLUoxetine (PROZAC) tablet 10 mg (has no administration in time range)  calcitRIOL (ROCALTROL) capsule 0.25 mcg (has no administration in time range)  insulin detemir (LEVEMIR) injection 7 Units (has no administration in time range)  levothyroxine (SYNTHROID) tablet 112 mcg (has no administration in time range)  senna-docusate (Senokot-S) tablet 1 tablet (has no administration in time range)  sevelamer carbonate (RENVELA) tablet 1,600 mg (has no administration in time range)  clonazePAM (KLONOPIN) tablet 1 mg (has no administration in time range)  montelukast (SINGULAIR) tablet 10 mg (has no administration in time range)  lidocaine (LIDODERM) 5 % 1 patch (has no administration in time range)  heparin 1000 UNIT/ML injection (has no administration in time range)  ipratropium-albuterol (DUONEB) 0.5-2.5 (3) MG/3ML nebulizer solution 3 mL (3 mLs Nebulization Given 11/10/18 1732)  sodium zirconium cyclosilicate (LOKELMA) packet 10 g (10 g Oral Given 11/10/18 1810)  iohexol (OMNIPAQUE) 350 MG/ML injection 100 mL (100  mLs Intravenous Contrast Given 11/10/18 2141)  ondansetron (ZOFRAN) injection 4 mg (4 mg Intravenous Given 11/10/18 2329)  methylPREDNISolone sodium succinate (SOLU-MEDROL) 125 mg/2 mL injection  125 mg (125 mg Intravenous Given 11/11/18 0032)  acetaminophen (TYLENOL) tablet 650 mg (650 mg Oral Given 11/11/18 0102)  ondansetron (ZOFRAN) injection 4 mg (4 mg Intravenous Given 11/11/18 0526)     Initial Impression / Assessment and Plan / ED Course  I have reviewed the triage vital signs and the nursing notes.  Pertinent labs & imaging results that were available during my care of the patient were reviewed by me and considered in my medical decision making (see chart for details).  Clinical Course as of Nov 10 812  Thu Nov 10, 2018  1747 WBC(!): 11.7 [JL]  1747 Hemoglobin(!): 9.3 [JL]  1747 MCV(!): 102.6 [JL]  1747 Potassium(!): 5.2 [JL]  1747 Creatinine(!): 9.43 [JL]    Clinical Course User Index [JL] Regan Lemming, MD       The patient presents to the ED afebrile, HDS, with wheezing present bilaterally and a hacking cough mildly productive of sputum concerning for a possible COPD exacerbation. She had desaturations to the 80s on her home 6L and did have an increase in her oxygen requirement to a NRB during her time in the ED due to desaturations in the 70s on 6L. Additional differential consideration is PE, PNA, worsening pHTN, volume overload. She additionally complains of a dialysis access problem, as her right sided PermCath has clotted.  The patient was treated with a Duoneb on arrival due to concern for possible COPD exacerbation.  Initial EKG concerning for sinus tachycardia without ischemic changes. CXR without acute abnormality. CTA PE study was negative for PE. The study did show evidence of pHTN and right heart failure. Labs with a mild leukocytosis to 11.7. A BMP was concerning for mild hyperkalemia to 5.2 with a Cr of 9.43 and a BUN of 37. A BNP was mildly elevated. COVID testing was pending. The vascular surgeon on call, Dr. Donzetta Matters was consulted and will place the patient on the OR schedule for 10/16 to fix her dialysis access. Nephrology, Dr. Louie Boston was  consulted and is aware and following the patient. Dr. Blaine Hamper of [facility] was consulted for admission.   Final Clinical Impressions(s) / ED Diagnoses   Final diagnoses:  Acute on chronic respiratory failure with hypoxia and hypercapnia ([place])  Problem with dialysis access, initial encounter [facility])    ED Discharge Orders    None       Regan Lemming, MD 11/11/18 [id number]    Merrily Pew, MD 11/12/18 [id number]

## 2018-11-10 NOTE — Progress Notes (Signed)
Was called by the ER about patient.  She is a TTS patient from [place] - was there on Tuesday but only ran 2.5 hours with 300 BFR.  [place] reportedly not working and was set up to go to [employer] for exchange on Friday..  She came in tonight SOB with increased O2 req- has baseline COPD and pulmonary HTN with large O2 req at home- 6 liters   Her orders are ... [facility], 4 hours - EDW 99- close to it of late 2K/2 Ca, profile #4 TDC  Calcitriol 1.75 q tx, sensipar 60 q treatment- last hgb 9.1,  Phos 5.7, PTH 435 no systemic heparin  It seems but do pack TDC with heparin  Will inform HD nurses first thing in AM to try the [facility]- TPA it and if not working will need exchange this admit.  Would not go immediately for exchange as I am not sure she could tolerate the procedure with her respiratory issues but if [facility] completely non functional may need ? Temp cath   Louis Meckel

## 2018-11-10 NOTE — ED Triage Notes (Signed)
Pt arrives via EMS from home with reports of feeling weak for a few days that has worsened. Pt is recliner bound and last had half treatment of HD on Tuesday before her access messed up. Pt to have HD access fixed yesterday but too weak. Due for HD today.

## 2018-11-10 NOTE — ED Notes (Signed)
Ms Burry's son has called back to speak to someone about his mom. He is irate and started cursing because no one will call him back. He said he is going to call administration.

## 2018-11-10 NOTE — ED Notes (Signed)
MD at bedside. 

## 2018-11-10 NOTE — ED Notes (Signed)
Pt son [name] [phone]

## 2018-11-10 NOTE — ED Notes (Signed)
Pt refusing CTA and wanting to leave. Informed resident. Stated he would come speak to the patient as soon as possible.

## 2018-11-10 NOTE — ED Notes (Signed)
[phone] [place]

## 2018-11-10 NOTE — H&P (Signed)
History and Physical    Joan Mosley [id number] DOB: [birth year] DOA: 11/10/2018  Referring MD/NP/PA:   PCP: Flossie Buffy, NP   Patient coming from:  The patient is coming from home.  At baseline, pt partially independent for most of ADL.        Chief Complaint: SOB and generalized weakness  HPI: Joan Mosley is a 73 y.o. female with medical history significant of pulmonary hypertension, COPD on 6L oxygen at home, hyperlipidemia, diabetes mellitus, TIA, hypothyroidism, depression, ESRD-HD (TTS), SHD recliner bound, obesity, who presents with generalized weakness and shortness of breath.  Patient states that she has been feeling bad, with generalized weakness in the past several days.  She has shortness of breath, which has been progressively worsening.  She has dry cough and wheezing.  Denies chest pain.  Patient has nausea, vomited few times, currently no vomiting.  Denies abdominal pain or diarrhea.  No symptoms of UTI or unilateral weakness. Per EDP, pt has oxygen desaturation to upper 80s on home 6 L oxygen in ED initially, which improved to 100% on NRB, then 95% on 6L of nasal cannula oxygen. The patient also endorses a problem with her HD access. She normally goes to dialysis [facility] and access is obtained via a right chest wall PermCath. She had partial dialysis on Tuesday since she was told her port was clotted.   ED Course: pt was found to have WBC 11.7, BNP 157, pending COVID-19 test, potassium 5.2, bicarbonate is 27, creatinine 9.43, BUN 37, temperature 98.7, blood pressure 130/68, tachycardia.  Chest x-ray showed bilateral basilar atelectasis.  CT angiogram is negative for PE, showed pulmonary hypertension and possible right heart failure. Pt is admitted to telemetry bed as inpatient.  Dr. Moshe Cipro of nephrology, and Dr. Donzetta Matters of [facility] were consulted.  Review of Systems:   General: no fevers, chills, no body weight gain, has poor appetite, has fatigue HEENT: no blurry  vision, hearing changes or sore throat CV: no chest pain, no palpitations Respiratory: has dyspnea, coughing, wheezing GI: has nausea, vomiting, no abdominal pain, diarrhea, constipation GU: no dysuria, burning on urination, increased urinary frequency, hematuria  Ext: has leg edema Neuro: no unilateral weakness, numbness, or tingling, no vision change or hearing loss Skin: no rash, no skin tear. MSK: No muscle spasm, no deformity, no limitation of range of movement in spin Heme: No easy bruising.  Travel history: No recent long distant travel.  Allergy:  Allergies  Allergen Reactions   Avelox [Moxifloxacin Hcl In Nacl] Other (See Comments)    PT does not remember   Codeine Anaphylaxis    PT does not remember   Penicillins Rash    Did it involve swelling of the face/tongue/throat, SOB, or low BP? Unknown Did it involve sudden or severe rash/hives, skin peeling, or any reaction on the inside of your mouth or nose? Yes Did you need to seek medical attention at a hospital or doctor's office? Unknown When did it last happen?unk If all above answers are NO, may proceed with cephalosporin use.    Sulfa Antibiotics Other (See Comments)    PT does nolt remember   Shellfish Allergy Hives, Itching and Rash    Past Medical History:  Diagnosis Date   Arthritis    COPD (chronic obstructive pulmonary disease) ([place])    Diabetes mellitus without complication ([place])    Diabetic neuropathy ([place])    Oxygen dependent 03/30/2018   5L home O2   Renal disorder    ESRD  Thyroid disease    TIA (transient ischemic attack)     Past Surgical History:  Procedure Laterality Date   ABDOMINAL HYSTERECTOMY     RECTOCELE REPAIR     REPLACEMENT TOTAL KNEE BILATERAL Bilateral 2008   THYROIDECTOMY     80%   VAGINAL WOUND CLOSURE / REPAIR      Social History:  reports that she quit smoking about 37 years ago. She has a 35.00 pack-year smoking history. She has never used  smokeless tobacco. She reports that she does not drink alcohol or use drugs.  Family History:  Family History  Problem Relation Age of Onset   Heart disease Mother    Hypertension Mother    Heart disease Father    Hypertension Father      Prior to Admission medications   Medication Sig Start Date End Date Taking? Authorizing Provider  acetaminophen (TYLENOL) 500 MG tablet Take 1,000 mg by mouth every 6 (six) hours as needed for mild pain.    [provider]  albuterol (PROVENTIL) (2.5 MG/3ML) 0.083% nebulizer solution Take 3 mLs (2.5 mg total) by nebulization 4 (four) times daily. 09/16/18   Mariel Aloe, MD  albuterol (VENTOLIN HFA) 108 (90 Base) MCG/ACT inhaler Inhale 2 puffs into the lungs every 6 (six) hours as needed for wheezing or shortness of breath.    [provider]  calcitRIOL (ROCALTROL) 0.25 MCG capsule Take 0.25 mcg by mouth daily.    [provider]  clonazePAM (KLONOPIN) 1 MG tablet Take 1 tablet (1 mg total) by mouth 2 (two) times daily as needed for up to 10 days for anxiety. Change in dose 10/04/18 10/14/18  Darliss Cheney, MD  FLUoxetine (PROZAC) 10 MG tablet Take 1 tablet (10 mg total) by mouth daily. 08/08/18   Nche, Charlene Brooke, NP  furosemide (LASIX) 80 MG tablet Take 80 mg by mouth See admin instructions. Take 80mg  by mouth on SUN MON WED FRI    [provider]  insulin detemir (LEVEMIR) 100 UNIT/ML injection Inject 0.15 mLs (15 Units total) into the skin daily. 09/16/18   Mariel Aloe, MD  insulin lispro (HUMALOG) 100 UNIT/ML injection Inject 1-5 Units into the skin 3 (three) times daily with meals as needed for high blood sugar. Sliding scale     [provider]  ipratropium (ATROVENT) 0.02 % nebulizer solution Inhale 0.5 mg into the lungs 4 (four) times daily.     [provider]  levothyroxine (SYNTHROID) 112 MCG tablet Take 1 tablet (112 mcg total) by mouth daily before breakfast. 1 AM Patient taking  differently: Take 112 mcg by mouth daily before breakfast.  07/04/18   Nche, Charlene Brooke, NP  lidocaine (LIDODERM) 5 % Place 1 patch onto the skin every 12 (twelve) hours as needed (back pain). Remove & Discard patch within 12 hours or as directed by MD     [provider]  midodrine (PROAMATINE) 10 MG tablet Take 10 mg by mouth 3 (three) times daily. Taking on Dialysis days, Tues, Thur, Sat.    [provider]  montelukast (SINGULAIR) 10 MG tablet Take 10 mg by mouth at bedtime. PM    [provider]  nystatin (MYCOSTATIN/NYSTOP) powder Apply 1 g topically 2 (two) times daily as needed (skin rash).     [provider]  nystatin cream (MYCOSTATIN) Apply 1 application topically 2 (two) times daily as needed for dry skin.     [provider]  rosuvastatin (CRESTOR) 40 MG  tablet Take 40 mg by mouth daily. AM    [provider]  senna-docusate (SENNA PLUS) 8.6-50 MG tablet Take 1 tablet by mouth at bedtime. 07/04/18   Nche, Charlene Brooke, NP  sevelamer (RENAGEL) 800 MG tablet Take 1,600 mg by mouth 3 (three) times daily with meals. 1600 mg --3 times daily--take additional 1-2 if eats snack    [provider]    Physical Exam: Vitals:   11/11/18 0535 11/11/18 0547 11/11/18 0616 11/11/18 0630  BP:   133/74   Pulse: 91 88  90  Resp: (!) 23 (!) 22  17  Temp:      TempSrc:      SpO2: 95% 93%  91%  Weight:      Height:       General: Not in acute distress HEENT:       Eyes: PERRL, EOMI, no scleral icterus.       ENT: No discharge from the ears and nose, no pharynx injection, no tonsillar enlargement.        Neck: No JVD, no bruit, no mass felt. Heme: No neck lymph node enlargement. Cardiac: S1/S2, RRR, No murmurs, No gallops or rubs. Respiratory: has wheezing bilaterally GI: Soft, nondistended, nontender, no rebound pain, no organomegaly, BS present. GU: No hematuria Ext: 1+ pitting leg edema bilaterally. 2+DP/PT pulse  bilaterally. Musculoskeletal: No joint deformities, No joint redness or warmth, no limitation of ROM in spin. Skin: No rashes.  Neuro: Alert, oriented X3, cranial nerves II-XII grossly intact, moves all extremities normally.  Psych: Patient is not psychotic, no suicidal or hemocidal ideation.  Labs on Admission: I have personally reviewed following labs and imaging studies  CBC: Recent Labs  Lab 11/10/18 1706 11/10/18 1713 11/11/18 0639  WBC 11.7*  --   --   NEUTROABS 9.5*  --   --   HGB 9.3* 10.2* 10.5*  HCT 31.2* 30.0* 31.0*  MCV 102.6*  --   --   PLT 180  --   --    Basic Metabolic Panel: Recent Labs  Lab 11/10/18 1706 11/10/18 1713 11/11/18 0639  NA 137 135 129*  K 5.2* 5.1 6.6*  CL 94*  --   --   CO2 27  --   --   GLUCOSE 97  --   --   BUN 37*  --   --   CREATININE 9.43*  --   --   CALCIUM 8.3*  --   --    GFR: Estimated Creatinine Clearance: 6 mL/min (A) (by C-G formula based on SCr of 9.43 mg/dL (H)). Liver Function Tests: No results for input(s): AST, ALT, ALKPHOS, BILITOT, PROT, ALBUMIN in the last 168 hours. No results for input(s): LIPASE, AMYLASE in the last 168 hours. No results for input(s): AMMONIA in the last 168 hours. Coagulation Profile: No results for input(s): INR, PROTIME in the last 168 hours. Cardiac Enzymes: No results for input(s): CKTOTAL, CKMB, CKMBINDEX, TROPONINI in the last 168 hours. BNP (last 3 results) No results for input(s): PROBNP in the last 8760 hours. HbA1C: No results for input(s): HGBA1C in the last 72 hours. CBG: Recent Labs  Lab 11/11/18 0058  GLUCAP 113*   Lipid Profile: No results for input(s): CHOL, HDL, LDLCALC, TRIG, CHOLHDL, LDLDIRECT in the last 72 hours. Thyroid Function Tests: Recent Labs    11/11/18 0254  TSH 6.010*   Anemia Panel: No results for input(s): VITAMINB12, FOLATE, FERRITIN, TIBC, IRON, RETICCTPCT in the last 72 hours. Urine analysis:  Component Value Date/Time   COLORURINE YELLOW  04/20/2018 0301   APPEARANCEUR HAZY (A) 04/20/2018 0301   LABSPEC 1.009 04/20/2018 0301   PHURINE 9.0 (H) 04/20/2018 0301   GLUCOSEU 150 (A) 04/20/2018 0301   HGBUR MODERATE (A) 04/20/2018 0301   BILIRUBINUR NEGATIVE 04/20/2018 0301   KETONESUR NEGATIVE 04/20/2018 0301   PROTEINUR 100 (A) 04/20/2018 0301   NITRITE NEGATIVE 04/20/2018 0301   LEUKOCYTESUR NEGATIVE 04/20/2018 0301   Sepsis Labs: @LABRCNTIP (procalcitonin:4,lacticidven:4) ) Recent Results (from the past 240 hour(s))  SARS CORONAVIRUS 2 (TAT 6-24 HRS) Nasopharyngeal Nasopharyngeal Swab     Status: None   Collection Time: 11/10/18 11:32 PM   Specimen: Nasopharyngeal Swab  Result Value Ref Range Status   SARS Coronavirus 2 NEGATIVE NEGATIVE Final    Comment: (NOTE) SARS-CoV-2 target nucleic acids are NOT DETECTED. The SARS-CoV-2 RNA is generally detectable in upper and lower respiratory specimens during the acute phase of infection. Negative results do not preclude SARS-CoV-2 infection, do not rule out co-infections with other pathogens, and should not be used as the sole basis for treatment or other patient management decisions. Negative results must be combined with clinical observations, patient history, and epidemiological information. The expected result is Negative. Fact Sheet for Patients: SugarRoll.be Fact Sheet for Healthcare Providers: https://www.woods-mathews.com/ This test is not yet approved or cleared by the [place] FDA and  has been authorized for detection and/or diagnosis of SARS-CoV-2 by FDA under an Emergency Use Authorization (EUA). This EUA will remain  in effect (meaning this test can be used) for the duration of the COVID-19 declaration under Section 56 4(b)(1) of the Act, 21 U.S.C. section 360bbb-3(b)(1), unless the authorization is terminated or revoked sooner. Performed at [facility], [place] [address] [place], [place] [address]       Radiological Exams on Admission: Dg Chest 2 View  Result Date: 11/10/2018 CLINICAL DATA:  Nonproductive cough for 3 days. COPD. EXAM: CHEST - 2 VIEW COMPARISON:  09/24/2018 FINDINGS: Right jugular dual-lumen central venous catheter remains in appropriate position. Heart size is stable. Mild atelectasis or scarring is seen at both lung bases, without significant change. No evidence of pulmonary consolidation or pleural effusion. IMPRESSION: Stable mild bibasilar atelectasis or scarring. Electronically Signed   By: Marlaine Hind M.D.   On: 11/10/2018 18:45   Ct Angio Chest Pe W And/or Wo Contrast  Result Date: 11/10/2018 CLINICAL DATA:  PE suspected, low pretest probability EXAM: CT ANGIOGRAPHY CHEST WITH CONTRAST TECHNIQUE: Multidetector CT imaging of the chest was performed using the standard protocol during bolus administration of intravenous contrast. Multiplanar CT image reconstructions and MIPs were obtained to evaluate the vascular anatomy. CONTRAST:  169mL OMNIPAQUE IOHEXOL 350 MG/ML SOLN COMPARISON:  CTA chest 09/11/2018 FINDINGS: Cardiovascular: Satisfactory opacification of the pulmonary arteries to the segmental level. No pulmonary arterial filling defects are visualized. Central pulmonary arterial enlargement is similar to prior exam and likely reflects chronic pulmonary artery hypertension. Suboptimal opacification of the thoracic aorta. Four vessel branching of the arch with the left vertebral artery arising directly from the arch. Atherosclerotic calcification is seen throughout the imaged aorta. Mild cardiomegaly with right atrial enlargement and reflux of contrast into the hepatic veins. Coronary artery calcifications are present. Aortic leaflet calcifications are seen as well. Mediastinum/Nodes: Shotty mediastinal adenopathy without enlarged axillary, hilar or mediastinal lymph nodes. Possibly reactive. Secretions are noted in the trachea with posterior bowing reflecting imaging during  exhalation. No acute abnormality of the thoracic esophagus. Patient appears to be post  thyroidectomy with diminutive glandular tissue. Remaining portions of the thoracic inlet are unremarkable. Lungs/Pleura: Bandlike areas of opacity in the lower lobes likely reflect areas of chronic atelectasis and/or scarring overall similar to the comparison study. Some increasing atelectatic changes noted in the right middle lobe. Centrilobular and paraseptal emphysema is present throughout the lungs most pronounced in the lung apices. Additional scarring and fibrosis is noted in the upper lungs with right apical bullous disease as well. Upper Abdomen: No acute abnormalities present in the visualized portions of the upper abdomen. Musculoskeletal: Multilevel degenerative changes are present in the imaged portions of the spine. Multilevel flowing anterior osteophytosis compatible features of diffuse idiopathic skeletal hyperostosis (DISH). Additional degenerative changes the shoulders. Rightward curvature of the spine is likely positional. No acute osseous abnormality or suspicious osseous lesion. Review of the MIP images confirms the above findings. IMPRESSION: 1. No evidence of pulmonary embolism. 2. Chronic pulmonary artery hypertension. 3. Cardiomegaly with right atrial enlargement and reflux of contrast into the hepatic veins, suggestive of right heart failure. 4. Volume loss with consolidation in the lung bases and right middle lobe has an appearance most compatible with subsegmental atelectasis and/or scarring. 5. Biapical fibrotic changes. 6. Aortic Atherosclerosis (ICD10-I70.0) 7. Emphysema (ICD10-J43.9). Electronically Signed   By: Lovena Le M.D.   On: 11/10/2018 22:20     EKG: Independently reviewed.  Sinus rhythm, QTC 462, low voltage, tachycardia, nonspecific T wave change.   Assessment/Plan Principal Problem:   Acute on chronic respiratory failure with hypoxia and hypercapnia (HCC) Active Problems:    Hyperkalemia   Hypothyroidism   ESRD (end stage renal disease) (HCC)   COPD with acute exacerbation (HCC)   TIA (transient ischemic attack)   HLD (hyperlipidemia)   Type II diabetes mellitus with renal manifestations (HCC)   Depression with anxiety   COPD exacerbation (HCC)   Acute on chronic respiratory failure with hypoxia and hypercapnia ([place]): Likely due to combination of COPD exacerbation and fluid overload. CAT negative for PE. Pending Covid19  -will admit patient to SDU as inpt - BiPAP - ABG -Inhaler: Atrovent inhaler,  Xopenex inhaler -Solu-Medrol 60 mg IV bid -Mucinex for cough  -Incentive spirometry -Follow up sputum culture -Nasal cannula oxygen as needed to maintain O2 saturation 93% or greater when off BiPAP -renal was consulted for HD - pt is on lasix 80 mg, 4 time per week - pt/OT due to generalized weakness  Hypothyroidism: -Synthroid  ESRD (end stage renal disease)-on hemodialysis (TTS): right chest wall PermCath is cloted -Dr. Moshe Cipro of nephrology, and Dr. Donzetta Matters of [facility] were consulted by EDP  TIA (transient ischemic attack): -Crestor  HLD (hyperlipidemia): -Crestor  Type II diabetes mellitus with renal manifestations ([place]): Last A1c 6.1 on 07/01/18, well controled. Patient is taking Humalog and Levemir at home.  Blood sugar 97. -will decrease Levemir dose from 15 to 7 unit daily -SSI  Depression with anxiety: -continue home meds  Hyperkalemia: K 5.2 -10 g of Lokelma was given in ED    Inpatient status:  # Patient requires inpatient status due to high intensity of service, high risk for further deterioration and high frequency of surveillance required.  I certify that at the point of admission it is my clinical judgment that the patient will require inpatient hospital care spanning beyond 2 midnights from the point of admission.   This patient has multiple chronic comorbidities including pulmonary hypertension, COPD on 6L oxygen at home,  hyperlipidemia, diabetes mellitus, TIA, hypothyroidism, depression, ESRD-HD (TTS), SHD recliner bound,  obesity  Now patient has presenting with acute respiratory failure with hypoxia due to combination of COPD exacerbation and fluid overload. Also has clotted right chest wall PermCath  The worrisome physical exam findings include wheezing on auscultation and 1+ leg edema bilaterally.  The initial radiographic and laboratory data are worrisome because of hyperkalemia, right heart failure and pulmonary hypertension by CT angiogram.  Current medical needs: please see my assessment and plan  Predictability of an adverse outcome (risk): Patient has multiple comorbidities, now presents with acute on chronic respiratory failure with hypoxia.  Patient has fluid overload. She also has clotted right chest wall PermCath.  Patient's presentation is highly complicated.  Patient is at high risk of deteriorating.  Will need to be treated in hospital for at least 2 days.     DVT ppx: SQ Heparin   Code Status: Full code (I discussed with patient about code status, and I explained the meaning of CODE STATUS.  She wanted to be full code today. Family Communication: None at bed side.      Disposition Plan:  Anticipate discharge back to previous home environment Consults called:  Renal, Dr. Moshe Cipro and [facility], Dr. Donzetta Matters Admission status:  Inpatient/tele      Date of Service 11/11/2018    [facility]   If 7PM-7AM, please contact night-coverage [email or web address] Password [facility] 11/11/2018, 7:37 AM

## 2018-11-10 NOTE — ED Notes (Signed)
Gave son and daughter-in-law update on pt. All questions answered. Pt has now agreed to CT scan and IV has been placed

## 2018-11-10 NOTE — ED Notes (Addendum)
Pt noted to be 75% on [place] 5L. Placed on nonrebreather @ 15. 02 now 93%. Crackles noted

## 2018-11-11 DIAGNOSIS — Z515 Encounter for palliative care: Secondary | ICD-10-CM | POA: Diagnosis not present

## 2018-11-11 DIAGNOSIS — Z66 Do not resuscitate: Secondary | ICD-10-CM | POA: Diagnosis not present

## 2018-11-11 DIAGNOSIS — J449 Chronic obstructive pulmonary disease, unspecified: Secondary | ICD-10-CM | POA: Diagnosis present

## 2018-11-11 DIAGNOSIS — T82858A Stenosis of vascular prosthetic devices, implants and grafts, initial encounter: Secondary | ICD-10-CM | POA: Diagnosis present

## 2018-11-11 DIAGNOSIS — J9622 Acute and chronic respiratory failure with hypercapnia: Secondary | ICD-10-CM | POA: Diagnosis present

## 2018-11-11 DIAGNOSIS — Z6841 Body Mass Index (BMI) 40.0 and over, adult: Secondary | ICD-10-CM | POA: Diagnosis not present

## 2018-11-11 DIAGNOSIS — Y838 Other surgical procedures as the cause of abnormal reaction of the patient, or of later complication, without mention of misadventure at the time of the procedure: Secondary | ICD-10-CM | POA: Diagnosis present

## 2018-11-11 DIAGNOSIS — E890 Postprocedural hypothyroidism: Secondary | ICD-10-CM | POA: Diagnosis present

## 2018-11-11 DIAGNOSIS — I2721 Secondary pulmonary arterial hypertension: Secondary | ICD-10-CM | POA: Diagnosis present

## 2018-11-11 DIAGNOSIS — E8770 Fluid overload, unspecified: Secondary | ICD-10-CM | POA: Diagnosis present

## 2018-11-11 DIAGNOSIS — Z20828 Contact with and (suspected) exposure to other viral communicable diseases: Secondary | ICD-10-CM | POA: Diagnosis present

## 2018-11-11 DIAGNOSIS — N186 End stage renal disease: Secondary | ICD-10-CM | POA: Diagnosis present

## 2018-11-11 DIAGNOSIS — F418 Other specified anxiety disorders: Secondary | ICD-10-CM | POA: Diagnosis present

## 2018-11-11 DIAGNOSIS — E785 Hyperlipidemia, unspecified: Secondary | ICD-10-CM | POA: Diagnosis present

## 2018-11-11 DIAGNOSIS — E1140 Type 2 diabetes mellitus with diabetic neuropathy, unspecified: Secondary | ICD-10-CM | POA: Diagnosis present

## 2018-11-11 DIAGNOSIS — J690 Pneumonitis due to inhalation of food and vomit: Secondary | ICD-10-CM | POA: Diagnosis not present

## 2018-11-11 DIAGNOSIS — I50810 Right heart failure, unspecified: Secondary | ICD-10-CM | POA: Diagnosis present

## 2018-11-11 DIAGNOSIS — N2581 Secondary hyperparathyroidism of renal origin: Secondary | ICD-10-CM | POA: Diagnosis present

## 2018-11-11 DIAGNOSIS — J9811 Atelectasis: Secondary | ICD-10-CM | POA: Diagnosis present

## 2018-11-11 DIAGNOSIS — R413 Other amnesia: Secondary | ICD-10-CM | POA: Diagnosis not present

## 2018-11-11 DIAGNOSIS — I132 Hypertensive heart and chronic kidney disease with heart failure and with stage 5 chronic kidney disease, or end stage renal disease: Secondary | ICD-10-CM | POA: Diagnosis present

## 2018-11-11 DIAGNOSIS — E8889 Other specified metabolic disorders: Secondary | ICD-10-CM | POA: Diagnosis present

## 2018-11-11 DIAGNOSIS — E875 Hyperkalemia: Secondary | ICD-10-CM | POA: Diagnosis present

## 2018-11-11 DIAGNOSIS — H9190 Unspecified hearing loss, unspecified ear: Secondary | ICD-10-CM | POA: Diagnosis present

## 2018-11-11 DIAGNOSIS — E1122 Type 2 diabetes mellitus with diabetic chronic kidney disease: Secondary | ICD-10-CM | POA: Diagnosis present

## 2018-11-11 DIAGNOSIS — J441 Chronic obstructive pulmonary disease with (acute) exacerbation: Secondary | ICD-10-CM | POA: Diagnosis present

## 2018-11-11 DIAGNOSIS — J9621 Acute and chronic respiratory failure with hypoxia: Secondary | ICD-10-CM | POA: Diagnosis present

## 2018-11-11 DIAGNOSIS — Z7189 Other specified counseling: Secondary | ICD-10-CM | POA: Diagnosis not present

## 2018-11-11 LAB — POCT I-STAT 7, (LYTES, BLD GAS, ICA,H+H)
Acid-base deficit: 1 mmol/L (ref 0.0–2.0)
Bicarbonate: 27.8 mmol/L (ref 20.0–28.0)
Calcium, Ion: 1.05 mmol/L — ABNORMAL LOW (ref 1.15–1.40)
HCT: 31 % — ABNORMAL LOW (ref 36.0–46.0)
Hemoglobin: 10.5 g/dL — ABNORMAL LOW (ref 12.0–15.0)
O2 Saturation: 86 %
Patient temperature: 98.6
Potassium: 6.6 mmol/L (ref 3.5–5.1)
Sodium: 129 mmol/L — ABNORMAL LOW (ref 135–145)
TCO2: 30 mmol/L (ref 22–32)
pCO2 arterial: 65.5 mmHg (ref 32.0–48.0)
pH, Arterial: 7.236 — ABNORMAL LOW (ref 7.350–7.450)
pO2, Arterial: 62 mmHg — ABNORMAL LOW (ref 83.0–108.0)

## 2018-11-11 LAB — RENAL FUNCTION PANEL
Albumin: 3.4 g/dL — ABNORMAL LOW (ref 3.5–5.0)
Anion gap: 14 (ref 5–15)
BUN: 21 mg/dL (ref 8–23)
CO2: 26 mmol/L (ref 22–32)
Calcium: 8.2 mg/dL — ABNORMAL LOW (ref 8.9–10.3)
Chloride: 95 mmol/L — ABNORMAL LOW (ref 98–111)
Creatinine, Ser: 5.65 mg/dL — ABNORMAL HIGH (ref 0.44–1.00)
GFR calc Af Amer: 8 mL/min — ABNORMAL LOW (ref >60)
GFR calc non Af Amer: 7 mL/min — ABNORMAL LOW (ref >60)
Glucose, Bld: 140 mg/dL — ABNORMAL HIGH (ref 70–99)
Phosphorus: 5.3 mg/dL — ABNORMAL HIGH (ref 2.5–4.6)
Potassium: 4.4 mmol/L (ref 3.5–5.1)
Sodium: 135 mmol/L (ref 135–145)

## 2018-11-11 LAB — CBC
HCT: 35.9 % — ABNORMAL LOW (ref 36.0–46.0)
Hemoglobin: 10.7 g/dL — ABNORMAL LOW (ref 12.0–15.0)
MCH: 30.5 pg (ref 26.0–34.0)
MCHC: 29.8 g/dL — ABNORMAL LOW (ref 30.0–36.0)
MCV: 102.3 fL — ABNORMAL HIGH (ref 80.0–100.0)
Platelets: 210 10*3/uL (ref 150–400)
RBC: 3.51 MIL/uL — ABNORMAL LOW (ref 3.87–5.11)
RDW: 17.5 % — ABNORMAL HIGH (ref 11.5–15.5)
WBC: 13.2 10*3/uL — ABNORMAL HIGH (ref 4.0–10.5)
nRBC: 0 % (ref 0.0–0.2)

## 2018-11-11 LAB — GLUCOSE, CAPILLARY
Glucose-Capillary: 124 mg/dL — ABNORMAL HIGH (ref 70–99)
Glucose-Capillary: 137 mg/dL — ABNORMAL HIGH (ref 70–99)

## 2018-11-11 LAB — TSH: TSH: 6.01 u[IU]/mL — ABNORMAL HIGH (ref 0.350–4.500)

## 2018-11-11 LAB — CBG MONITORING, ED: Glucose-Capillary: 113 mg/dL — ABNORMAL HIGH (ref 70–99)

## 2018-11-11 LAB — SARS CORONAVIRUS 2 (TAT 6-24 HRS): SARS Coronavirus 2: NEGATIVE

## 2018-11-11 LAB — LACTIC ACID, PLASMA: Lactic Acid, Venous: 0.9 mmol/L (ref 0.5–1.9)

## 2018-11-11 MED ORDER — HEPARIN SODIUM (PORCINE) 1000 UNIT/ML DIALYSIS: 20.0000 [IU]/kg | INTRAMUSCULAR | Status: DC | PRN

## 2018-11-11 MED ORDER — HEPARIN SODIUM (PORCINE) 5000 UNIT/ML IJ SOLN
5000.0000 [IU] | Freq: Three times a day (TID) | INTRAMUSCULAR | Status: DC
  Administered 2018-11-11 – 2018-11-20 (×21): 5000 [IU] via SUBCUTANEOUS
  Filled 2018-11-11 (×21): qty 1

## 2018-11-11 MED ORDER — ALTEPLASE 2 MG IJ SOLR: 2.0000 mg | Freq: Once | INTRAMUSCULAR | Status: DC | PRN

## 2018-11-11 MED ORDER — METHYLPREDNISOLONE SODIUM SUCC 125 MG IJ SOLR
60.0000 mg | Freq: Two times a day (BID) | INTRAMUSCULAR | Status: DC
  Administered 2018-11-11: 06:00:00 60 mg via INTRAVENOUS
  Filled 2018-11-11: qty 2

## 2018-11-11 MED ORDER — LIDOCAINE-PRILOCAINE 2.5-2.5 % EX CREA: 1.0000 "application " | TOPICAL_CREAM | CUTANEOUS | Status: DC | PRN

## 2018-11-11 MED ORDER — CALCITRIOL 0.25 MCG PO CAPS: 0.2500 ug | ORAL_CAPSULE | Freq: Every day | ORAL | Status: DC

## 2018-11-11 MED ORDER — MONTELUKAST SODIUM 10 MG PO TABS
10.0000 mg | ORAL_TABLET | Freq: Every day | ORAL | Status: DC
  Administered 2018-11-12 – 2018-11-19 (×7): 10 mg via ORAL
  Filled 2018-11-11 (×8): qty 1

## 2018-11-11 MED ORDER — PENTAFLUOROPROP-TETRAFLUOROETH EX AERO: 1.0000 "application " | INHALATION_SPRAY | CUTANEOUS | Status: DC | PRN

## 2018-11-11 MED ORDER — INSULIN DETEMIR 100 UNIT/ML ~~LOC~~ SOLN
7.0000 [IU] | Freq: Every day | SUBCUTANEOUS | Status: DC
  Administered 2018-11-12 – 2018-11-18 (×6): 7 [IU] via SUBCUTANEOUS
  Filled 2018-11-11 (×8): qty 0.07

## 2018-11-11 MED ORDER — CALCITRIOL 0.5 MCG PO CAPS
1.7500 ug | ORAL_CAPSULE | ORAL | Status: DC
  Administered 2018-11-12: 0.5 ug via ORAL
  Administered 2018-11-17: 1.75 ug via ORAL

## 2018-11-11 MED ORDER — LIDOCAINE HCL (PF) 1 % IJ SOLN: 5.0000 mL | INTRAMUSCULAR | Status: DC | PRN

## 2018-11-11 MED ORDER — ONDANSETRON HCL 4 MG/2ML IJ SOLN
4.0000 mg | Freq: Once | INTRAMUSCULAR | Status: AC
  Administered 2018-11-11: 4 mg via INTRAVENOUS
  Filled 2018-11-11: qty 2

## 2018-11-11 MED ORDER — SEVELAMER CARBONATE 800 MG PO TABS
1600.0000 mg | ORAL_TABLET | Freq: Three times a day (TID) | ORAL | Status: DC
  Administered 2018-11-11 – 2018-11-19 (×13): 1600 mg via ORAL
  Filled 2018-11-11 (×16): qty 2

## 2018-11-11 MED ORDER — ACETAMINOPHEN 325 MG PO TABS
650.0000 mg | ORAL_TABLET | Freq: Once | ORAL | Status: AC
  Administered 2018-11-11: 650 mg via ORAL
  Filled 2018-11-11: qty 2

## 2018-11-11 MED ORDER — CLONAZEPAM 0.5 MG PO TABS
1.0000 mg | ORAL_TABLET | Freq: Two times a day (BID) | ORAL | Status: DC | PRN
  Administered 2018-11-12 – 2018-11-16 (×4): 1 mg via ORAL
  Filled 2018-11-11 (×4): qty 2

## 2018-11-11 MED ORDER — ALTEPLASE 2 MG IJ SOLR
INTRAMUSCULAR | Status: AC
  Filled 2018-11-11: qty 2

## 2018-11-11 MED ORDER — SENNOSIDES-DOCUSATE SODIUM 8.6-50 MG PO TABS
1.0000 | ORAL_TABLET | Freq: Every day | ORAL | Status: DC
  Administered 2018-11-12 – 2018-11-19 (×6): 1 via ORAL
  Filled 2018-11-11 (×6): qty 1

## 2018-11-11 MED ORDER — METHYLPREDNISOLONE SODIUM SUCC 125 MG IJ SOLR
40.0000 mg | Freq: Four times a day (QID) | INTRAMUSCULAR | Status: DC
  Administered 2018-11-11 – 2018-11-14 (×12): 40 mg via INTRAVENOUS
  Filled 2018-11-11 (×12): qty 2

## 2018-11-11 MED ORDER — IPRATROPIUM BROMIDE 0.02 % IN SOLN
0.5000 mg | RESPIRATORY_TRACT | Status: DC
  Administered 2018-11-11 – 2018-11-13 (×11): 0.5 mg via RESPIRATORY_TRACT
  Filled 2018-11-11 (×10): qty 2.5

## 2018-11-11 MED ORDER — SODIUM CHLORIDE 0.9 % IV SOLN: 100.0000 mL | INTRAVENOUS | Status: DC | PRN

## 2018-11-11 MED ORDER — FLUOXETINE HCL 10 MG PO CAPS
10.0000 mg | ORAL_CAPSULE | Freq: Every day | ORAL | Status: DC
  Administered 2018-11-11 – 2018-11-19 (×7): 10 mg via ORAL
  Filled 2018-11-11 (×9): qty 1

## 2018-11-11 MED ORDER — HEPARIN SODIUM (PORCINE) 1000 UNIT/ML DIALYSIS: 1000.0000 [IU] | INTRAMUSCULAR | Status: DC | PRN

## 2018-11-11 MED ORDER — LEVALBUTEROL HCL 0.63 MG/3ML IN NEBU: 0.6300 mg | INHALATION_SOLUTION | Freq: Four times a day (QID) | RESPIRATORY_TRACT | Status: DC | PRN

## 2018-11-11 MED ORDER — LIDOCAINE 5 % EX PTCH: 1.0000 | MEDICATED_PATCH | Freq: Two times a day (BID) | CUTANEOUS | Status: DC | PRN

## 2018-11-11 MED ORDER — FUROSEMIDE 80 MG PO TABS
80.0000 mg | ORAL_TABLET | ORAL | Status: DC
  Administered 2018-11-13 – 2018-11-16 (×3): 80 mg via ORAL
  Filled 2018-11-11 (×4): qty 1

## 2018-11-11 MED ORDER — DM-GUAIFENESIN ER 30-600 MG PO TB12
1.0000 | ORAL_TABLET | Freq: Two times a day (BID) | ORAL | Status: DC | PRN
  Administered 2018-11-18: 07:00:00 1 via ORAL
  Filled 2018-11-11 (×2): qty 1

## 2018-11-11 MED ORDER — LEVOTHYROXINE SODIUM 112 MCG PO TABS
112.0000 ug | ORAL_TABLET | Freq: Every day | ORAL | Status: DC
  Administered 2018-11-12 – 2018-11-20 (×7): 112 ug via ORAL
  Filled 2018-11-11 (×7): qty 1

## 2018-11-11 MED ORDER — ONDANSETRON HCL 4 MG/2ML IJ SOLN
4.0000 mg | Freq: Three times a day (TID) | INTRAMUSCULAR | Status: DC | PRN
  Administered 2018-11-15: 4 mg via INTRAVENOUS
  Filled 2018-11-11: qty 2

## 2018-11-11 MED ORDER — INSULIN ASPART 100 UNIT/ML ~~LOC~~ SOLN
0.0000 [IU] | Freq: Three times a day (TID) | SUBCUTANEOUS | Status: DC
  Administered 2018-11-11: 1 [IU] via SUBCUTANEOUS
  Administered 2018-11-12: 18:00:00 2 [IU] via SUBCUTANEOUS
  Administered 2018-11-12 – 2018-11-13 (×2): 3 [IU] via SUBCUTANEOUS
  Administered 2018-11-13: 08:00:00 2 [IU] via SUBCUTANEOUS
  Administered 2018-11-13: 3 [IU] via SUBCUTANEOUS
  Administered 2018-11-14: 08:00:00 2 [IU] via SUBCUTANEOUS
  Administered 2018-11-14: 17:00:00 3 [IU] via SUBCUTANEOUS
  Administered 2018-11-14 – 2018-11-16 (×2): 2 [IU] via SUBCUTANEOUS
  Administered 2018-11-16 (×2): 3 [IU] via SUBCUTANEOUS
  Administered 2018-11-17: 1 [IU] via SUBCUTANEOUS
  Administered 2018-11-17: 2 [IU] via SUBCUTANEOUS
  Administered 2018-11-18: 09:00:00 3 [IU] via SUBCUTANEOUS

## 2018-11-11 MED ORDER — ACETAMINOPHEN 325 MG PO TABS
650.0000 mg | ORAL_TABLET | Freq: Four times a day (QID) | ORAL | Status: DC | PRN
  Administered 2018-11-11 – 2018-11-19 (×8): 650 mg via ORAL
  Filled 2018-11-11 (×7): qty 2

## 2018-11-11 MED ORDER — HEPARIN SODIUM (PORCINE) 1000 UNIT/ML IJ SOLN
INTRAMUSCULAR | Status: AC
  Filled 2018-11-11: qty 1

## 2018-11-11 MED ORDER — ROSUVASTATIN CALCIUM 20 MG PO TABS
40.0000 mg | ORAL_TABLET | Freq: Every day | ORAL | Status: DC
  Administered 2018-11-13 – 2018-11-17 (×5): 40 mg via ORAL
  Filled 2018-11-11 (×7): qty 2

## 2018-11-11 MED ORDER — CINACALCET HCL 30 MG PO TABS
60.0000 mg | ORAL_TABLET | ORAL | Status: DC
  Administered 2018-11-17 – 2018-11-19 (×2): 60 mg via ORAL
  Filled 2018-11-11 (×4): qty 2

## 2018-11-11 NOTE — Progress Notes (Signed)
PT Cancellation Note  Patient Details Name: Joan Mosley MRN: [id number] DOB: February 05, 1945   Cancelled Treatment:    Reason Eval/Treat Not Completed: Patient not medically ready Pt on BiPAP with soft BP so not appropriate for PT at this time. Will follow.   Marguarite Arbour A Alem Fahl 11/11/2018, 3:03 PM Wray Kearns, PT, DPT Acute Rehabilitation Services Pager [phone] Office [phone]

## 2018-11-11 NOTE — ED Notes (Signed)
Pt having severe wheezing. Given PRN breathing tx. Placed back on nonrebreather

## 2018-11-11 NOTE — ED Notes (Signed)
Spoke with HD RN and stated she was concerned about the pt's respiratory status with her coming up to receive tx. Will place pt on bipap and await for HD RN to call back

## 2018-11-11 NOTE — Progress Notes (Signed)
PROGRESS NOTE    Joan Mosley  [id number] DOB: [birth year] DOA: 11/10/2018 PCP: Flossie Buffy, NP   Brief Narrative:  Joan Mosley is a 73 y.o. female with medical history significant of pulmonary hypertension, COPD on 6L oxygen at home, hyperlipidemia, diabetes mellitus, TIA, hypothyroidism, depression, ESRD-HD (TTS), SHD recliner bound, obesity, who presents with generalized weakness and shortness of breath.  Patient states that she has been feeling bad, with generalized weakness in the past several days.  She has shortness of breath, which has been progressively worsening.  She has dry cough and wheezing.  Denies chest pain.  Patient has nausea, vomited few times, currently no vomiting.  Denies abdominal pain or diarrhea.  No symptoms of UTI or unilateral weakness. Per EDP, pt has oxygen desaturation to upper 80s on home 6 L oxygen in ED initially, which improved to 100% on NRB, then 95% on 6L of nasal cannula oxygen. The patient also endorses a problem with her HD access. She normally goes to dialysis [facility] and access is obtained via a right chest wall PermCath. She had partial dialysis on Tuesday since she was told her port was clotted.    Assessment & Plan:   Principal Problem:   Acute on chronic respiratory failure with hypoxia and hypercapnia (HCC) Active Problems:   Hyperkalemia   Hypothyroidism   ESRD (end stage renal disease) (HCC)   COPD with acute exacerbation (HCC)   TIA (transient ischemic attack)   HLD (hyperlipidemia)   Type II diabetes mellitus with renal manifestations (HCC)   Depression with anxiety   COPD exacerbation (HCC)   Acute on chronic respiratory failure with hypoxia and hypercapnia ([place]): Likely due to combination of COPD exacerbation and fluid overload. CAT negative for PE. Pending Covid19  - BiPAP-wean as tolrated, home 02 is 6L -Inhaler: Atrovent inhaler,  Xopenex inhaler -Solu-Medrol 40 mg IV Q6 -Mucinex for cough  -Incentive  spirometry -Follow up sputum culture - pt is on lasix 80 mg, 4 time per week - PT/OT eval due to generalized weakness  Hypothyroidism: -Synthroid  - no evid of hormonal dysregulation  ESRD (end stage renal disease)-on hemodialysis (TTS): right chest wall PermCath is cloted -Dr. Moshe Cipro of nephrology, and Dr. Donzetta Matters of [facility] were consulted by EDP -vasc acces per vasc/renal  TIA (transient ischemic attack): -Crestor -unclear why pt is not on asa-discuss with pt  HLD (hyperlipidemia): -Crestor  Type II diabetes mellitus with renal manifestations ([place]): Last A1c 6.1 on 07/01/18, well controled. Patient is taking Humalog and Levemir at home.  Blood sugar 97. -will cont Levemir reduced dose (15 to 7 unit) -SSI  Depression with anxiety: -continue home meds  Hyperkalemia: K 5.2>4.4 -10 g of Lokelma was given in ED -addressed by K bath in HD  DVT prophylaxis: Heparin SQ  Code Status: Full    Code Status Orders  (From admission, onward)         Start     Ordered   11/11/18 0153  Full code  Continuous     11/11/18 0152        Code Status History    Date Active Date Inactive Code Status Order ID Comments User Context   09/24/2018 0050 10/04/2018 2124 DNR [id number]  Jani Gravel, MD ED   09/16/2018 1336 09/16/2018 2339 DNR [id number]  Earlie Counts, NP Inpatient   09/08/2018 2356 09/16/2018 1336 Full Code [id number]  Merton Border, MD Inpatient   09/08/2018 1839 09/08/2018 2356 Full Code [id number]  Merton Border, MD ED  09/06/2018 0949 09/07/2018 1528 Full Code [id number]  Karmen Bongo, MD ED   08/22/2018 2230 08/26/2018 0106 Full Code [id number]  Jani Gravel, MD ED   Advance Care Planning Activity     Family Communication: none today  Disposition Plan:   ` Patient will remain npatient for treatment of acute on chronic respiratory failure requiring BiPAP, electrolyte disarray Consults called: None Admission status: Inpatient   Consultants:   None  Procedures:  Dg Chest 2  View  Result Date: 11/10/2018 CLINICAL DATA:  Nonproductive cough for 3 days. COPD. EXAM: CHEST - 2 VIEW COMPARISON:  09/24/2018 FINDINGS: Right jugular dual-lumen central venous catheter remains in appropriate position. Heart size is stable. Mild atelectasis or scarring is seen at both lung bases, without significant change. No evidence of pulmonary consolidation or pleural effusion. IMPRESSION: Stable mild bibasilar atelectasis or scarring. Electronically Signed   By: Marlaine Hind M.D.   On: 11/10/2018 18:45   Ct Angio Chest Pe W And/or Wo Contrast  Result Date: 11/10/2018 CLINICAL DATA:  PE suspected, low pretest probability EXAM: CT ANGIOGRAPHY CHEST WITH CONTRAST TECHNIQUE: Multidetector CT imaging of the chest was performed using the standard protocol during bolus administration of intravenous contrast. Multiplanar CT image reconstructions and MIPs were obtained to evaluate the vascular anatomy. CONTRAST:  120mL OMNIPAQUE IOHEXOL 350 MG/ML SOLN COMPARISON:  CTA chest 09/11/2018 FINDINGS: Cardiovascular: Satisfactory opacification of the pulmonary arteries to the segmental level. No pulmonary arterial filling defects are visualized. Central pulmonary arterial enlargement is similar to prior exam and likely reflects chronic pulmonary artery hypertension. Suboptimal opacification of the thoracic aorta. Four vessel branching of the arch with the left vertebral artery arising directly from the arch. Atherosclerotic calcification is seen throughout the imaged aorta. Mild cardiomegaly with right atrial enlargement and reflux of contrast into the hepatic veins. Coronary artery calcifications are present. Aortic leaflet calcifications are seen as well. Mediastinum/Nodes: Shotty mediastinal adenopathy without enlarged axillary, hilar or mediastinal lymph nodes. Possibly reactive. Secretions are noted in the trachea with posterior bowing reflecting imaging during exhalation. No acute abnormality of the thoracic  esophagus. 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 Remaining portions of the thoracic inlet are unremarkable. Lungs/Pleura: Bandlike areas of opacity in the lower lobes likely reflect areas of chronic atelectasis and/or scarring overall similar to the comparison study. Some increasing atelectatic changes noted in the right middle lobe. Centrilobular and paraseptal emphysema is present throughout the lungs most pronounced in the lung apices. Additional scarring and fibrosis is noted in the upper lungs with right apical bullous disease as well. Upper Abdomen: No acute abnormalities present in the visualized portions of the upper abdomen. Musculoskeletal: Multilevel degenerative changes are present in the imaged portions of the spine. Multilevel flowing anterior osteophytosis compatible features of diffuse idiopathic skeletal hyperostosis (DISH). Additional degenerative changes the shoulders. Rightward curvature of the spine is likely positional. No acute osseous abnormality or suspicious osseous lesion. Review of the MIP images confirms the above findings. IMPRESSION: 1. No evidence of pulmonary embolism. 2. Chronic pulmonary artery hypertension. 3. Cardiomegaly with right atrial enlargement and reflux of contrast into the hepatic veins, suggestive of right heart failure. 4. Volume loss with consolidation in the lung bases and right middle lobe has an appearance most compatible with subsegmental atelectasis and/or scarring. 5. Biapical fibrotic changes. 6. Aortic Atherosclerosis (ICD10-I70.0) 7. Emphysema (ICD10-J43.9). Electronically Signed   By: Lovena Le M.D.   On: 11/10/2018 22:20     Antimicrobials:   none  Subjective: Patient still short of breath and volume overloaded, Wean BiPAP and dialysis but O2 saturations dropped to the low 80s Patient visibly short of breath  Objective: Vitals:   11/11/18 1030 11/11/18 1100 11/11/18 1125 11/11/18 1140  BP: (!) 93/47  (!) 110/56  (!) 86/51  Pulse: 97 76 94 95  Resp:   19 (!) 190  Temp:    97.9 F (36.6 C)  TempSrc:    Axillary  SpO2:   93% 97%  Weight:      Height:        Intake/Output Summary (Last 24 hours) at 11/11/2018 1409 Last data filed at 11/11/2018 1118 Gross per 24 hour  Intake --  Output 3088 ml  Net -3088 ml   Filed Weights   11/10/18 1557  Weight: 113.4 kg    Examination:  General exam: Appears calm, but SOB Respiratory system: Rales, rhonchi and wheezing bilat Cardiovascular system: S1 & S2 heard, RRR. No JVD, murmurs, rubs, gallops or clicks. No pedal edema. Gastrointestinal system: Abdomen is nondistended, soft and nontender. No organomegaly or masses felt. Normal bowel sounds heard. Central nervous system: Alert and oriented. No focal neurological deficits. Extremities: wwp, pitting edema bilat le Skin: No rashes, lesions or ulcers Psychiatry: Judgement and insight are poor. Mood & affect appropriate.     Data Reviewed: I have personally reviewed following labs and imaging studies  CBC: Recent Labs  Lab 11/10/18 1706 11/10/18 1713 11/11/18 0639 11/11/18 0939  WBC 11.7*  --   --  13.2*  NEUTROABS 9.5*  --   --   --   HGB 9.3* 10.2* 10.5* 10.7*  HCT 31.2* 30.0* 31.0* 35.9*  MCV 102.6*  --   --  102.3*  PLT 180  --   --  [id number]   Basic Metabolic Panel: Recent Labs  Lab 11/10/18 1706 11/10/18 1713 11/11/18 0639 11/11/18 0939  NA 137 135 129* 135  K 5.2* 5.1 6.6* 4.4  CL 94*  --   --  95*  CO2 27  --   --  26  GLUCOSE 97  --   --  140*  BUN 37*  --   --  21  CREATININE 9.43*  --   --  5.65*  CALCIUM 8.3*  --   --  8.2*  PHOS  --   --   --  5.3*   GFR: Estimated Creatinine Clearance: 10.1 mL/min (A) (by C-G formula based on SCr of 5.65 mg/dL (H)). Liver Function Tests: Recent Labs  Lab 11/11/18 0939  ALBUMIN 3.4*   No results for input(s): LIPASE, AMYLASE in the last 168 hours. No results for input(s): AMMONIA in the last 168 hours. Coagulation  Profile: No results for input(s): INR, PROTIME in the last 168 hours. Cardiac Enzymes: No results for input(s): CKTOTAL, CKMB, CKMBINDEX, TROPONINI in the last 168 hours. BNP (last 3 results) No results for input(s): PROBNP in the last 8760 hours. HbA1C: No results for input(s): HGBA1C in the last 72 hours. CBG: Recent Labs  Lab 11/11/18 0058  GLUCAP 113*   Lipid Profile: No results for input(s): CHOL, HDL, LDLCALC, TRIG, CHOLHDL, LDLDIRECT in the last 72 hours. Thyroid Function Tests: Recent Labs    11/11/18 0254  TSH 6.010*   Anemia Panel: No results for input(s): VITAMINB12, FOLATE, FERRITIN, TIBC, IRON, RETICCTPCT in the last 72 hours. Sepsis Labs: Recent Labs  Lab 11/11/18 0254  LATICACIDVEN 0.9    Recent Results (from the past 240 hour(s))  SARS CORONAVIRUS  2 (TAT 6-24 HRS) Nasopharyngeal Nasopharyngeal Swab     Status: None   Collection Time: 11/10/18 11:32 PM   Specimen: Nasopharyngeal Swab  Result Value Ref Range Status   SARS Coronavirus 2 NEGATIVE NEGATIVE Final    Comment: (NOTE) SARS-CoV-2 target nucleic acids are NOT DETECTED. The SARS-CoV-2 RNA is generally detectable in upper and lower respiratory specimens during the acute phase of infection. Negative results do not preclude SARS-CoV-2 infection, do not rule out co-infections with other pathogens, and should not be used as the sole basis for treatment or other patient management decisions. Negative results must be combined with clinical observations, patient history, and epidemiological information. The expected result is Negative. Fact Sheet for Patients: SugarRoll.be Fact Sheet for Healthcare Providers: https://www.woods-mathews.com/ This test is not yet approved or cleared by the [place] FDA and  has been authorized for detection and/or diagnosis of SARS-CoV-2 by FDA under an Emergency Use Authorization (EUA). This EUA will remain  in effect  (meaning this test can be used) for the duration of the COVID-19 declaration under Section 56 4(b)(1) of the Act, 21 U.S.C. section 360bbb-3(b)(1), unless the authorization is terminated or revoked sooner. Performed at [facility], [place] [address] [place], [place] [address]          Radiology Studies: Dg Chest 2 View  Result Date: 11/10/2018 CLINICAL DATA:  Nonproductive cough for 3 days. COPD. EXAM: CHEST - 2 VIEW COMPARISON:  09/24/2018 FINDINGS: Right jugular dual-lumen central venous catheter remains in appropriate position. Heart size is stable. Mild atelectasis or scarring is seen at both lung bases, without significant change. No evidence of pulmonary consolidation or pleural effusion. IMPRESSION: Stable mild bibasilar atelectasis or scarring. Electronically Signed   By: Marlaine Hind M.D.   On: 11/10/2018 18:45   Ct Angio Chest Pe W And/or Wo Contrast  Result Date: 11/10/2018 CLINICAL DATA:  PE suspected, low pretest probability EXAM: CT ANGIOGRAPHY CHEST WITH CONTRAST TECHNIQUE: Multidetector CT imaging of the chest was performed using the standard protocol during bolus administration of intravenous contrast. Multiplanar CT image reconstructions and MIPs were obtained to evaluate the vascular anatomy. CONTRAST:  129mL OMNIPAQUE IOHEXOL 350 MG/ML SOLN COMPARISON:  CTA chest 09/11/2018 FINDINGS: Cardiovascular: Satisfactory opacification of the pulmonary arteries to the segmental level. No pulmonary arterial filling defects are visualized. Central pulmonary arterial enlargement is similar to prior exam and likely reflects chronic pulmonary artery hypertension. Suboptimal opacification of the thoracic aorta. Four vessel branching of the arch with the left vertebral artery arising directly from the arch. Atherosclerotic calcification is seen throughout the imaged aorta. Mild cardiomegaly with right atrial enlargement and reflux of contrast into the hepatic veins. Coronary artery  calcifications are present. Aortic leaflet calcifications are seen as well. Mediastinum/Nodes: Shotty mediastinal adenopathy without enlarged axillary, hilar or mediastinal lymph nodes. Possibly reactive. Secretions are noted in the trachea with posterior bowing reflecting imaging during exhalation. No acute abnormality of the thoracic esophagus. 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 Remaining portions of the thoracic inlet are unremarkable. Lungs/Pleura: Bandlike areas of opacity in the lower lobes likely reflect areas of chronic atelectasis and/or scarring overall similar to the comparison study. Some increasing atelectatic changes noted in the right middle lobe. Centrilobular and paraseptal emphysema is present throughout the lungs most pronounced in the lung apices. Additional scarring and fibrosis is noted in the upper lungs with right apical bullous disease as well. Upper Abdomen: No acute abnormalities present in the visualized portions of  the upper abdomen. Musculoskeletal: Multilevel degenerative changes are present in the imaged portions of the spine. Multilevel flowing anterior osteophytosis compatible features of diffuse idiopathic skeletal hyperostosis (DISH). Additional degenerative changes the shoulders. Rightward curvature of the spine is likely positional. No acute osseous abnormality or suspicious osseous lesion. Review of the MIP images confirms the above findings. IMPRESSION: 1. No evidence of pulmonary embolism. 2. Chronic pulmonary artery hypertension. 3. Cardiomegaly with right atrial enlargement and reflux of contrast into the hepatic veins, suggestive of right heart failure. 4. Volume loss with consolidation in the lung bases and right middle lobe has an appearance most compatible with subsegmental atelectasis and/or scarring. 5. Biapical fibrotic changes. 6. Aortic Atherosclerosis (ICD10-I70.0) 7. Emphysema (ICD10-J43.9). Electronically Signed   By:  Lovena Le M.D.   On: 11/10/2018 22:20        Scheduled Meds:  [START ON 11/12/2018] calcitRIOL  1.75 mcg Oral Once per day on Tue Thu Sat   Chlorhexidine Gluconate Cloth  6 each Topical Q0600   [START ON 11/12/2018] cinacalcet  60 mg Oral Once per day on Tue Thu Sat   FLUoxetine  10 mg Oral Daily   furosemide  80 mg Oral Once per day on Sun Mon Wed Fri   heparin       heparin  5,000 Units Subcutaneous Q8H   insulin aspart  0-9 Units Subcutaneous TID WC   insulin detemir  7 Units Subcutaneous Daily   ipratropium  0.5 mg Inhalation Q4H   levothyroxine  112 mcg Oral Q0600   methylPREDNISolone (SOLU-MEDROL) injection  60 mg Intravenous Q12H   montelukast  10 mg Oral QHS   rosuvastatin  40 mg Oral Daily   senna-docusate  1 tablet Oral QHS   sevelamer carbonate  1,600 mg Oral TID WC   Continuous Infusions:  sodium chloride     sodium chloride       LOS: 0 days    Time spent: 35 min    Nicolette Bang, MD [facility]  If 7PM-7AM, please contact night-coverage  11/11/2018, 2:09 PM

## 2018-11-11 NOTE — Progress Notes (Signed)
   I was contacted regarding exchange of right IJ tunneled dialysis catheter for patient which was scheduled to be changed at a separate institution this morning the patient was admitted with shortness of breath.  She is currently on dialysis via the malfunctioning catheter although the flows are low.  If catheter fails to work we can exchange for new tunneled catheter so long as her respiratory status improves.  She would otherwise need temporary catheter but will certainly need more definitive access prior to discharge.  [place] C. Donzetta Matters, MD [facility] Office: [phone] Pager: [phone]

## 2018-11-11 NOTE — Consult Note (Signed)
[facility] Renal Consultation Note    Indication for Consultation:  Management of ESRD/hemodialysis, anemia, hypertension/volume, and secondary hyperparathyroidism. PCP:  HPI: Joan Mosley is a 73 y.o. female with ESRD, HTN, T2DM, Hx TIA, hypothyroidism who was admitted with SOB.   Reports had been feeling "bad" x 2-3 days with dyspnea, cough, wheezing. Denies fever or chills. No known COVID contacts. No N/V/D. Has been going to HD, but having recent issues with her catheter preventing full treatment. Last dialyzed 10/13 for only 2.5hr - left approx 7kg above her EDW. Plan was for outpatient [facility] exchange however she ended up in ED instead.  Intake labs showed K 5.2, Ca 8.3, WBC 11.7, Hgb 9.3, BNP 157, LA 0.9. CXR and CT angio without PE, effusions, but with significant COPD changes/fibrosis. COVID negative.  We were consulted for dialysis and volume management.  Past Medical History:  Diagnosis Date  . Arthritis   . COPD (chronic obstructive pulmonary disease) ([place])   . Diabetes mellitus without complication ([place])   . Diabetic neuropathy ([place])   . 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 Renal disorder    ESRD  . Thyroid disease   . TIA (transient ischemic attack)    Past Surgical History:  Procedure Laterality Date  . ABDOMINAL HYSTERECTOMY    . RECTOCELE REPAIR    . REPLACEMENT TOTAL KNEE BILATERAL Bilateral 2008  . THYROIDECTOMY     80%  . VAGINAL WOUND CLOSURE / REPAIR     Family History  Problem Relation Age of Onset  . Heart disease Mother   . Hypertension Mother   . Heart disease Father   . Hypertension Father    Social History:  reports that she quit smoking about 37 years ago. She has a 35.00 pack-year smoking history. She has never used smokeless tobacco. She reports that she does not drink alcohol or use drugs.  ROS: As per HPI otherwise negative.  Physical Exam: Vitals:   11/11/18 0535 11/11/18 0547 11/11/18 0616 11/11/18 0630  BP:    133/74   Pulse: 91 88  90  Resp: (!) 23 (!) 22  17  Temp:      TempSrc:      SpO2: 95% 93%  91%  Weight:      Height:         General: Obese woman, NAD. On C-pap Head: Normocephalic, atraumatic, sclera non-icteric, mucus membranes are moist. Neck: Supple without lymphadenopathy/masses. JVD not elevated. Lungs: Coarse bibasilar breath sounds. Heart: RRR with normal S1, S2. No murmurs, rubs, or gallops appreciated. Abdomen: Soft, non-tender, non-distended with normoactive bowel sounds. No rebound/guarding. No obvious abdominal masses. Musculoskeletal:  Strength and tone appear normal for age. Lower extremities: 1+ LE edema Neuro: Alert and oriented X 3. Moves all extremities spontaneously. Psych:  Responds to questions appropriately with a normal affect. Dialysis Access: TDC in R chest  Allergies  Allergen Reactions  . Avelox [Moxifloxacin Hcl In Nacl] Other (See Comments)    PT does not remember  . Codeine Anaphylaxis    PT does not remember  . Penicillins Rash    Did it involve swelling of the face/tongue/throat, SOB, or low BP? Unknown Did it involve sudden or severe rash/hives, skin peeling, or any reaction on the inside of your mouth or nose? Yes Did you need to seek medical attention at a hospital or doctor's office? Unknown When did it last happen?unk If all above answers are "NO", may proceed with cephalosporin use.   Marland Kitchen  Sulfa Antibiotics Other (See Comments)    PT does nolt remember  . Shellfish Allergy Hives, Itching and Rash   Prior to Admission medications   Medication Sig Start Date End Date Taking? Authorizing Provider  acetaminophen (TYLENOL) 500 MG tablet Take 1,000 mg by mouth every 6 (six) hours as needed for mild pain.    [provider]  albuterol (PROVENTIL) (2.5 MG/3ML) 0.083% nebulizer solution Take 3 mLs (2.5 mg total) by nebulization 4 (four) times daily. 09/16/18   Mariel Aloe, MD  albuterol (VENTOLIN HFA) 108 (90 Base) MCG/ACT  inhaler Inhale 2 puffs into the lungs every 6 (six) hours as needed for wheezing or shortness of breath.    [provider]  calcitRIOL (ROCALTROL) 0.25 MCG capsule Take 0.25 mcg by mouth daily.    [provider]  clonazePAM (KLONOPIN) 1 MG tablet Take 1 tablet (1 mg total) by mouth 2 (two) times daily as needed for up to 10 days for anxiety. Change in dose 10/04/18 10/14/18  Darliss Cheney, MD  FLUoxetine (PROZAC) 10 MG tablet Take 1 tablet (10 mg total) by mouth daily. 08/08/18   Nche, Charlene Brooke, NP  furosemide (LASIX) 80 MG tablet Take 80 mg by mouth See admin instructions. Take 80mg  by mouth on SUN MON WED FRI    [provider]  insulin detemir (LEVEMIR) 100 UNIT/ML injection Inject 0.15 mLs (15 Units total) into the skin daily. 09/16/18   Mariel Aloe, MD  insulin lispro (HUMALOG) 100 UNIT/ML injection Inject 1-5 Units into the skin 3 (three) times daily with meals as needed for high blood sugar. Sliding scale     [provider]  ipratropium (ATROVENT) 0.02 % nebulizer solution Inhale 0.5 mg into the lungs 4 (four) times daily.     [provider]  levothyroxine (SYNTHROID) 112 MCG tablet Take 1 tablet (112 mcg total) by mouth daily before breakfast. 1 AM Patient taking differently: Take 112 mcg by mouth daily before breakfast.  07/04/18   Nche, Charlene Brooke, NP  lidocaine (LIDODERM) 5 % Place 1 patch onto the skin every 12 (twelve) hours as needed (back pain). Remove & Discard patch within 12 hours or as directed by MD     [provider]  midodrine (PROAMATINE) 10 MG tablet Take 10 mg by mouth 3 (three) times daily. Taking on Dialysis days, Tues, Thur, Sat.    [provider]  montelukast (SINGULAIR) 10 MG tablet Take 10 mg by mouth at bedtime. PM    [provider]  nystatin (MYCOSTATIN/NYSTOP) powder Apply 1 g topically 2 (two) times daily as needed (skin rash).     [provider]  nystatin cream (MYCOSTATIN)  Apply 1 application topically 2 (two) times daily as needed for dry skin.     [provider]  rosuvastatin (CRESTOR) 40 MG tablet Take 40 mg by mouth daily. AM    [provider]  senna-docusate (SENNA PLUS) 8.6-50 MG tablet Take 1 tablet by mouth at bedtime. 07/04/18   Nche, Charlene Brooke, NP  sevelamer (RENAGEL) 800 MG tablet Take 1,600 mg by mouth 3 (three) times daily with meals. 1600 mg --3 times daily--take additional 1-2 if eats snack    [provider]   Current Facility-Administered Medications  Medication Dose Route Frequency Provider Last Rate Last Dose  . 0.9 %  sodium chloride infusion  100 mL Intravenous PRN Corliss Parish, MD      . 0.9 %  sodium chloride infusion  100 mL Intravenous PRN Corliss Parish, MD      . acetaminophen (TYLENOL) tablet 650 mg  650 mg Oral Q6H PRN Ivor Costa, MD   650 mg at 11/11/18 [id number]  . alteplase (CATHFLO ACTIVASE) injection 2 mg  2 mg Intracatheter Once PRN Corliss Parish, MD      . calcitRIOL (ROCALTROL) capsule 0.25 mcg  0.25 mcg Oral Daily Ivor Costa, MD      . Chlorhexidine Gluconate Cloth 2 % PADS 6 each  6 each Topical Q0600 Ivor Costa, MD      . clonazePAM [facility] [place]) tablet 1 mg  1 mg Oral BID PRN Ivor Costa, MD      . dextromethorphan-guaiFENesin [facility] DM) 30-600 MG per 12 hr tablet 1 tablet  1 tablet Oral BID PRN Ivor Costa, MD      . FLUoxetine (PROZAC) tablet 10 mg  10 mg Oral Daily Ivor Costa, MD      . furosemide (LASIX) tablet 80 mg  80 mg Oral Once per day on Sun Mon Wed Alwyn Ren, MD      . heparin 1000 UNIT/ML injection           . heparin injection 1,000 Units  1,000 Units Dialysis PRN Corliss Parish, MD      . heparin injection 2,300 Units  20 Units/kg Dialysis PRN Corliss Parish, MD      . heparin injection 5,000 Units  5,000 Units Subcutaneous Q8H Ivor Costa, MD   5,000 Units at 11/11/18 [phone]  . insulin aspart (novoLOG) injection 0-9 Units  0-9 Units Subcutaneous TID  WC Ivor Costa, MD      . insulin detemir (LEVEMIR) injection 7 Units  7 Units Subcutaneous Daily Ivor Costa, MD      . ipratropium (ATROVENT) nebulizer solution 0.5 mg  0.5 mg Inhalation Q4H Ivor Costa, MD   0.5 mg at 11/11/18 0536  . levalbuterol (XOPENEX) nebulizer solution 0.63 mg  0.63 mg Inhalation Q6H PRN Ivor Costa, MD      . levothyroxine (SYNTHROID) tablet 112 mcg  112 mcg Oral Q0600 Ivor Costa, MD      . lidocaine (LIDODERM) 5 % 1 patch  1 patch Transdermal Q12H PRN Ivor Costa, MD      . lidocaine (PF) (XYLOCAINE) 1 % injection 5 mL  5 mL Intradermal PRN Corliss Parish, MD      . lidocaine-prilocaine (EMLA) cream 1 application  1 application Topical PRN Corliss Parish, MD      . methylPREDNISolone sodium succinate (SOLU-MEDROL) 125 mg/2 mL injection 60 mg  60 mg Intravenous Q12H Ivor Costa, MD   60 mg at 11/11/18 0541  . montelukast (SINGULAIR) tablet 10 mg  10 mg Oral QHS Ivor Costa, MD      . ondansetron [facility]) injection 4 mg  4 mg Intravenous Q8H PRN Ivor Costa, MD      . pentafluoroprop-tetrafluoroeth (GEBAUERS) aerosol 1 application  1 application Topical PRN Corliss Parish, MD      . rosuvastatin (CRESTOR) tablet 40 mg  40 mg Oral Daily Ivor Costa, MD      . senna-docusate (Senokot-S) tablet 1 tablet  1 tablet Oral QHS Ivor Costa, MD      . sevelamer carbonate (RENVELA) tablet 1,600 mg  1,600 mg Oral TID WC Ivor Costa, MD       Current Outpatient Medications  Medication Sig Dispense Refill  . acetaminophen (TYLENOL) 500 MG tablet Take 1,000 mg by mouth every 6 (six) hours as needed for mild  pain.    . albuterol (PROVENTIL) (2.5 MG/3ML) 0.083% nebulizer solution Take 3 mLs (2.5 mg total) by nebulization 4 (four) times daily. 360 mL 0  . albuterol (VENTOLIN HFA) 108 (90 Base) MCG/ACT inhaler Inhale 2 puffs into the lungs every 6 (six) hours as needed for wheezing or shortness of breath.    . calcitRIOL (ROCALTROL) 0.25 MCG capsule Take 0.25 mcg by mouth daily.     . clonazePAM (KLONOPIN) 1 MG tablet Take 1 tablet (1 mg total) by mouth 2 (two) times daily as needed for up to 10 days for anxiety. Change in dose 20 tablet 0  . FLUoxetine (PROZAC) 10 MG tablet Take 1 tablet (10 mg total) by mouth daily. 30 tablet 5  . furosemide (LASIX) 80 MG tablet Take 80 mg by mouth See admin instructions. Take 80mg  by mouth on SUN MON WED FRI    . insulin detemir (LEVEMIR) 100 UNIT/ML injection Inject 0.15 mLs (15 Units total) into the skin daily. 10 mL 0  . insulin lispro (HUMALOG) 100 UNIT/ML injection Inject 1-5 Units into the skin 3 (three) times daily with meals as needed for high blood sugar. Sliding scale     . ipratropium (ATROVENT) 0.02 % nebulizer solution Inhale 0.5 mg into the lungs 4 (four) times daily.     Marland Kitchen levothyroxine (SYNTHROID) 112 MCG tablet Take 1 tablet (112 mcg total) by mouth daily before breakfast. 1 AM (Patient taking differently: Take 112 mcg by mouth daily before breakfast. ) 90 tablet 0  . lidocaine (LIDODERM) 5 % Place 1 patch onto the skin every 12 (twelve) hours as needed (back pain). Remove & Discard patch within 12 hours or as directed by MD     . midodrine (PROAMATINE) 10 MG tablet Take 10 mg by mouth 3 (three) times daily. Taking on Dialysis days, Tues, Thur, Sat.    . montelukast (SINGULAIR) 10 MG tablet Take 10 mg by mouth at bedtime. PM    . nystatin (MYCOSTATIN/NYSTOP) powder Apply 1 g topically 2 (two) times daily as needed (skin rash).     . nystatin cream (MYCOSTATIN) Apply 1 application topically 2 (two) times daily as needed for dry skin.     . rosuvastatin (CRESTOR) 40 MG tablet Take 40 mg by mouth daily. AM    . senna-docusate (SENNA PLUS) 8.6-50 MG tablet Take 1 tablet by mouth at bedtime. 90 tablet 1  . sevelamer (RENAGEL) 800 MG tablet Take 1,600 mg by mouth 3 (three) times daily with meals. 1600 mg --3 times daily--take additional 1-2 if eats snack     Labs: Basic Metabolic Panel: Recent Labs  Lab 11/10/18 1706  11/10/18 1713 11/11/18 0639  NA 137 135 129*  K 5.2* 5.1 6.6*  CL 94*  --   --   CO2 27  --   --   GLUCOSE 97  --   --   BUN 37*  --   --   CREATININE 9.43*  --   --   CALCIUM 8.3*  --   --    CBC: Recent Labs  Lab 11/10/18 1706 11/10/18 1713 11/11/18 0639  WBC 11.7*  --   --   NEUTROABS 9.5*  --   --   HGB 9.3* 10.2* 10.5*  HCT 31.2* 30.0* 31.0*  MCV 102.6*  --   --   PLT 180  --   --    CBG: Recent Labs  Lab 11/11/18 0058  GLUCAP 113*   Studies/Results: Dg Chest 2  View  Result Date: 11/10/2018 CLINICAL DATA:  Nonproductive cough for 3 days. COPD. EXAM: CHEST - 2 VIEW COMPARISON:  09/24/2018 FINDINGS: Right jugular dual-lumen central venous catheter remains in appropriate position. Heart size is stable. Mild atelectasis or scarring is seen at both lung bases, without significant change. No evidence of pulmonary consolidation or pleural effusion. IMPRESSION: Stable mild bibasilar atelectasis or scarring. Electronically Signed   By: Marlaine Hind M.D.   On: 11/10/2018 18:45   Ct Angio Chest Pe W And/or Wo Contrast  Result Date: 11/10/2018 CLINICAL DATA:  PE suspected, low pretest probability EXAM: CT ANGIOGRAPHY CHEST WITH CONTRAST TECHNIQUE: Multidetector CT imaging of the chest was performed using the standard protocol during bolus administration of intravenous contrast. Multiplanar CT image reconstructions and MIPs were obtained to evaluate the vascular anatomy. CONTRAST:  165mL OMNIPAQUE IOHEXOL 350 MG/ML SOLN COMPARISON:  CTA chest 09/11/2018 FINDINGS: Cardiovascular: Satisfactory opacification of the pulmonary arteries to the segmental level. No pulmonary arterial filling defects are visualized. Central pulmonary arterial enlargement is similar to prior exam and likely reflects chronic pulmonary artery hypertension. Suboptimal opacification of the thoracic aorta. Four vessel branching of the arch with the left vertebral artery arising directly from the arch.  Atherosclerotic calcification is seen throughout the imaged aorta. Mild cardiomegaly with right atrial enlargement and reflux of contrast into the hepatic veins. Coronary artery calcifications are present. Aortic leaflet calcifications are seen as well. Mediastinum/Nodes: Shotty mediastinal adenopathy without enlarged axillary, hilar or mediastinal lymph nodes. Possibly reactive. Secretions are noted in the trachea with posterior bowing reflecting imaging during exhalation. No acute abnormality of the thoracic esophagus. 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 Remaining portions of the thoracic inlet are unremarkable. Lungs/Pleura: Bandlike areas of opacity in the lower lobes likely reflect areas of chronic atelectasis and/or scarring overall similar to the comparison study. Some increasing atelectatic changes noted in the right middle lobe. Centrilobular and paraseptal emphysema is present throughout the lungs most pronounced in the lung apices. Additional scarring and fibrosis is noted in the upper lungs with right apical bullous disease as well. Upper Abdomen: No acute abnormalities present in the visualized portions of the upper abdomen. Musculoskeletal: Multilevel degenerative changes are present in the imaged portions of the spine. Multilevel flowing anterior osteophytosis compatible features of diffuse idiopathic skeletal hyperostosis (DISH). Additional degenerative changes the shoulders. Rightward curvature of the spine is likely positional. No acute osseous abnormality or suspicious osseous lesion. Review of the MIP images confirms the above findings. IMPRESSION: 1. No evidence of pulmonary embolism. 2. Chronic pulmonary artery hypertension. 3. Cardiomegaly with right atrial enlargement and reflux of contrast into the hepatic veins, suggestive of right heart failure. 4. Volume loss with consolidation in the lung bases and right middle lobe has an appearance most compatible  with subsegmental atelectasis and/or scarring. 5. Biapical fibrotic changes. 6. Aortic Atherosclerosis (ICD10-I70.0) 7. Emphysema (ICD10-J43.9). Electronically Signed   By: Lovena Le M.D.   On: 11/10/2018 22:20   Dialysis Orders:  [facility], EDW 99kg, 2K/2Ca, UFP #4, no heparin, TDC R chest - Calcitriol 1.89mcg PO q HD - Sensipar 60mg  PO q HD - Venofer 100 x 5 ordered - Mircera 100 q 2 weeks  Assessment/Plan: 1.  Dyspnea: Likely volume + COPD. On HD now - 5L UF goal. Using c-pap. Solumedrol + inhalers. 2.  TDC dysfunction: Rolling along but with BFR 150-200 range. 3.  ESRD:  Usual TTS sched - HD today for volume, back again tomorrow for  further volume and per usual HD. 4.  Hypertension/volume: Excess volume. See above. 5.  Anemia:Hgb 10.5 - not due for ESA yet. 6.  Metabolic bone disease: Ca ok, Phos pending. Continue home binders/VDRA/sensipar. 7.  Hypothyroidism: Continue synthroid. 8.  T2DM  Veneta Penton, PA-C 11/11/2018, 9:05 AM  [employer] Pager: [phone]

## 2018-11-12 ENCOUNTER — Inpatient Hospital Stay (HOSPITAL_COMMUNITY): Payer: Medicare Other

## 2018-11-12 DIAGNOSIS — E875 Hyperkalemia: Secondary | ICD-10-CM

## 2018-11-12 LAB — GLUCOSE, CAPILLARY
Glucose-Capillary: 149 mg/dL — ABNORMAL HIGH (ref 70–99)
Glucose-Capillary: 134 mg/dL — ABNORMAL HIGH (ref 70–99)
Glucose-Capillary: 141 mg/dL — ABNORMAL HIGH (ref 70–99)

## 2018-11-12 MED ORDER — CALCITRIOL 0.5 MCG PO CAPS
ORAL_CAPSULE | ORAL | Status: AC
  Filled 2018-11-12: qty 3

## 2018-11-12 MED ORDER — CALCITRIOL 0.25 MCG PO CAPS
ORAL_CAPSULE | ORAL | Status: AC
  Filled 2018-11-12: qty 1

## 2018-11-12 MED ORDER — HEPARIN SODIUM (PORCINE) 1000 UNIT/ML IJ SOLN
INTRAMUSCULAR | Status: AC
  Administered 2018-11-12: 16:00:00 1000 [IU] via INTRAVENOUS
  Filled 2018-11-12: qty 4

## 2018-11-12 MED ORDER — MIDODRINE HCL 5 MG PO TABS: 10.0000 mg | ORAL_TABLET | ORAL | Status: DC

## 2018-11-12 NOTE — Progress Notes (Addendum)
[facility] Progress Note   Subjective: Seen in room on BIPAP. Anxious but denies SOB at present.   Objective Vitals:   11/12/18 0403 11/12/18 0750 11/12/18 0824 11/12/18 0825  BP:  112/80    Pulse: 75 74  73  Resp: 18 (!) 95  16  Temp:  97.9 F (36.6 C)    TempSrc:  Oral    SpO2: 98% 95% 94% 98%  Weight:      Height:       Physical Exam General: Chronically ill appearing older female.  Heart: S1,S2 RRR SR on monitor. Prominent neck veins, slight JVD Lungs: Very decreased in bases, is not moving air well-very tight breath sounds. On BIPAP, no WOB.  Abdomen: Obese, NT Extremities: woody appearance BLE, trace ankle edema. Dialysis Access: [facility] intact   Additional Objective Labs: Basic Metabolic Panel: Recent Labs  Lab 11/10/18 1706 11/10/18 1713 11/11/18 0639 11/11/18 0939  NA 137 135 129* 135  K 5.2* 5.1 6.6* 4.4  CL 94*  --   --  95*  CO2 27  --   --  26  GLUCOSE 97  --   --  140*  BUN 37*  --   --  21  CREATININE 9.43*  --   --  5.65*  CALCIUM 8.3*  --   --  8.2*  PHOS  --   --   --  5.3*   Liver Function Tests: Recent Labs  Lab 11/11/18 0939  ALBUMIN 3.4*   No results for input(s): LIPASE, AMYLASE in the last 168 hours. CBC: Recent Labs  Lab 11/10/18 1706 11/10/18 1713 11/11/18 0639 11/11/18 0939  WBC 11.7*  --   --  13.2*  NEUTROABS 9.5*  --   --   --   HGB 9.3* 10.2* 10.5* 10.7*  HCT 31.2* 30.0* 31.0* 35.9*  MCV 102.6*  --   --  102.3*  PLT 180  --   --  210   Blood Culture    Component Value Date/Time   SDES BLOOD LEFT ARM 11/11/2018 1636   SDES BLOOD LEFT ARM 11/11/2018 1636   SPECREQUEST  11/11/2018 1636    BOTTLES DRAWN AEROBIC AND ANAEROBIC Blood Culture adequate volume   SPECREQUEST  11/11/2018 1636    AEROBIC BOTTLE ONLY Blood Culture results may not be optimal due to an inadequate volume of blood received in culture bottles   CULT  11/11/2018 1636    NO GROWTH < 24 HOURS Performed at [facility] [address] [place], [place] [address]    CULT  11/11/2018 1636    NO GROWTH < 24 HOURS Performed at [facility], [place] [address], [place], [place] [address]    REPTSTATUS PENDING 11/11/2018 1636   REPTSTATUS PENDING 11/11/2018 1636    Cardiac Enzymes: No results for input(s): CKTOTAL, CKMB, CKMBINDEX, TROPONINI in the last 168 hours. CBG: Recent Labs  Lab 11/11/18 0058 11/11/18 1832 11/11/18 2120 11/12/18 0754  GLUCAP 113* 124* 137* 149*   Iron Studies: No results for input(s): IRON, TIBC, TRANSFERRIN, FERRITIN in the last 72 hours. @lablastinr3 @ Studies/Results: Dg Chest 2 View  Result Date: 11/10/2018 CLINICAL DATA:  Nonproductive cough for 3 days. COPD. EXAM: CHEST - 2 VIEW COMPARISON:  09/24/2018 FINDINGS: Right jugular dual-lumen central venous catheter remains in appropriate position. Heart size is stable. Mild atelectasis or scarring is seen at both lung bases, without significant change. No evidence of pulmonary consolidation or pleural effusion. IMPRESSION:  Stable mild bibasilar atelectasis or scarring. Electronically Signed   By: Marlaine Hind M.D.   On: 11/10/2018 18:45   Ct Angio Chest Pe W And/or Wo Contrast  Result Date: 11/10/2018 CLINICAL DATA:  PE suspected, low pretest probability EXAM: CT ANGIOGRAPHY CHEST WITH CONTRAST TECHNIQUE: Multidetector CT imaging of the chest was performed using the standard protocol during bolus administration of intravenous contrast. Multiplanar CT image reconstructions and MIPs were obtained to evaluate the vascular anatomy. CONTRAST:  127mL OMNIPAQUE IOHEXOL 350 MG/ML SOLN COMPARISON:  CTA chest 09/11/2018 FINDINGS: Cardiovascular: Satisfactory opacification of the pulmonary arteries to the segmental level. No pulmonary arterial filling defects are visualized. Central pulmonary arterial enlargement is similar to prior exam and likely reflects chronic pulmonary artery hypertension. Suboptimal opacification of the thoracic  aorta. Four vessel branching of the arch with the left vertebral artery arising directly from the arch. Atherosclerotic calcification is seen throughout the imaged aorta. Mild cardiomegaly with right atrial enlargement and reflux of contrast into the hepatic veins. Coronary artery calcifications are present. Aortic leaflet calcifications are seen as well. Mediastinum/Nodes: Shotty mediastinal adenopathy without enlarged axillary, hilar or mediastinal lymph nodes. Possibly reactive. Secretions are noted in the trachea with posterior bowing reflecting imaging during exhalation. No acute abnormality of the thoracic esophagus. 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 Remaining portions of the thoracic inlet are unremarkable. Lungs/Pleura: Bandlike areas of opacity in the lower lobes likely reflect areas of chronic atelectasis and/or scarring overall similar to the comparison study. Some increasing atelectatic changes noted in the right middle lobe. Centrilobular and paraseptal emphysema is present throughout the lungs most pronounced in the lung apices. Additional scarring and fibrosis is noted in the upper lungs with right apical bullous disease as well. Upper Abdomen: No acute abnormalities present in the visualized portions of the upper abdomen. Musculoskeletal: Multilevel degenerative changes are present in the imaged portions of the spine. Multilevel flowing anterior osteophytosis compatible features of diffuse idiopathic skeletal hyperostosis (DISH). Additional degenerative changes the shoulders. Rightward curvature of the spine is likely positional. No acute osseous abnormality or suspicious osseous lesion. Review of the MIP images confirms the above findings. IMPRESSION: 1. No evidence of pulmonary embolism. 2. Chronic pulmonary artery hypertension. 3. Cardiomegaly with right atrial enlargement and reflux of contrast into the hepatic veins, suggestive of right heart failure. 4.  Volume loss with consolidation in the lung bases and right middle lobe has an appearance most compatible with subsegmental atelectasis and/or scarring. 5. Biapical fibrotic changes. 6. Aortic Atherosclerosis (ICD10-I70.0) 7. Emphysema (ICD10-J43.9). Electronically Signed   By: Lovena Le M.D.   On: 11/10/2018 22:20   Medications:  . calcitRIOL  1.75 mcg Oral Once per day on Tue Thu Sat  . Chlorhexidine Gluconate Cloth  6 each Topical Q0600  . cinacalcet  60 mg Oral Once per day on Tue Thu Sat  . FLUoxetine  10 mg Oral Daily  . furosemide  80 mg Oral Once per day on Sun Mon Wed Fri  . heparin  5,000 Units Subcutaneous Q8H  . insulin aspart  0-9 Units Subcutaneous TID WC  . insulin detemir  7 Units Subcutaneous Daily  . ipratropium  0.5 mg Inhalation Q4H  . levothyroxine  112 mcg Oral Q0600  . methylPREDNISolone (SOLU-MEDROL) injection  40 mg Intravenous Q6H  . montelukast  10 mg Oral QHS  . rosuvastatin  40 mg Oral Daily  . senna-docusate  1 tablet Oral QHS  . sevelamer carbonate  1,600 mg Oral  TID WC     Dialysis Orders:  [facility], EDW 99kg, 2K/2Ca, UFP #4, no heparin, TDC R chest - Calcitriol 1.107mcg PO q HD - Sensipar 60mg  PO q HD - Venofer 100 x 5 ordered - Mircera 100 q 2 weeks  Assessment/Plan: 1.  Dyspnea: Likely volume + COPD. Probably had excess volume on board yesterday but today appears to be related to COPD exacerbation. On BIPAP, per primary. No fluid on CXR or CT, hx of COPD on 6L O2 at home.  HD again today in progress, see if this helps any.  2.  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 Has been seen by [facility] this admission.   3.  ESRD:  Usual TTS sched - HD off schedule yesterday, again today to get back on schedule. K+ 4.4.  4.  Hypertension/volume-HD yesterday Net UF 3 liters. No post wt done. BP now on soft side. Will attempt 2 liter in HD today.  5.  Anemia:Hgb 10.7. No ESA needed.  6.  Metabolic bone disease: Ca/Phos pending. Continue home  binders/VDRA/sensipar. 7.  Hypothyroidism: Continue synthroid. 8.  T2DM  Rita H. Brown NP-C 11/12/2018, 9:47 AM  [facility] [phone]  Pt seen, examined and agree w A/P as above.  Kelly Splinter  MD 11/12/2018, 1:28 PM

## 2018-11-12 NOTE — Procedures (Signed)
   I was present at this dialysis session, have reviewed the session itself and made  appropriate changes Kelly Splinter MD [place] pager [phone]   11/12/2018, 2:23 PM

## 2018-11-12 NOTE — Progress Notes (Addendum)
Addendum : Pt had on multiple occasion had threatened to go AMA if she is not able to eat even after she was told of the possible consequences off her BiPAP.  Pt had multiple attempts of removing her BiPAP throughout the day. Her O2 saturation had dropped from 51% to low 70's while off BiPaP  Dr. Ree Kida and RRT Puja RN  aware . MD ,RRT and this RN had spoken with pt multiple times about the need to keep her Bipap or she may need to be intubated. Had spoken with son [name] multiple times to update him of pt condition. Pt remainds alert and oriented. Had also asked pt and son, [name] , that they need to appoint 1 person as  the lead person  that needs to be contacted in case pt is not able to decide for herself. Apparently [name] has the Medical POA and the pt has agreed that he [name] ) will the person  To contact  From now on in case she is not able to decide for herself.

## 2018-11-12 NOTE — Progress Notes (Signed)
PROGRESS NOTE    Joan Mosley  [id number] DOB: 07-08-1945 DOA: 11/10/2018 PCP: Flossie Buffy, NP   Brief Narrative:  HPI on 11/10/2018 by Dr. Ivor Costa Joan Mosley is a 73 y.o. female with medical history significant of pulmonary hypertension, COPD on 6L oxygen at home, hyperlipidemia, diabetes mellitus, TIA, hypothyroidism, depression, ESRD-HD (TTS), SHD recliner bound, obesity, who presents with generalized weakness and shortness of breath.  Patient states that she has been feeling bad, with generalized weakness in the past several days.  She has shortness of breath, which has been progressively worsening.  She has dry cough and wheezing.  Denies chest pain.  Patient has nausea, vomited few times, currently no vomiting.  Denies abdominal pain or diarrhea.  No symptoms of UTI or unilateral weakness. Per EDP, pt has oxygen desaturation to upper 80s on home 6 L oxygen in ED initially, which improved to 100% on NRB, then 95% on 6L of nasal cannula oxygen. The patient also endorses a problem with her HD access. She normally goes to dialysis [facility] and access is obtained via a right chest wall PermCath. She had partial dialysis on Tuesday since she was told her port was clotted.   Interim history Patient here with acute on chronic respiratory failure with hypoxia and hypercapnia.  Placed on BiPAP.  Unable to wean at this time as patient has desaturation levels into the 50s.  Suspect is secondary to volume overload.  Patient also with ESRD and noted to have PermCath clot.  Vascular surgery consulted and appreciated as well as nephrology.  Patient to dialyze again today.  Assessment & Plan   Acute on chronic hypoxic and hypercapnic respiratory failure -Likely multifactorial including COPD exacerbation as well as hypervolemia -CTA negative for PE -COVID-19 negative  -Continue Solu-Medrol, Mucinex, incentive spirometry, Lasix -Currently on BiPAP, will attempt to wean if possible -Of  note, patient on 6 L of home oxygen at baseline  Hypothyroidism -Continue Synthroid  ESRD -Patient on hemodialysis on Tuesday, Thursday, Saturday -On admission, right chest wall PermCath was clotted -Nephrology and vascular surgery consulted and appreciated -For HD today  History of TIA -Continue statin -Unclear why patient is not on aspirin, will discuss with patient when she is more alert  Hyperlipidemia -Continue statin  Diabetes mellitus, type II -Continue Levemir, insulin sliding scale with CBG monitoring -Last hemoglobin A1c 6.1 on 07/01/2018  Depression anxiety -Continue home medications  Hyperkalemia -resolved   DVT Prophylaxis  heparin  Code Status: Full  Family Communication: None at bedside  Disposition Plan: Admitted. Pending improvement in respiratory status.  Consultants Nephrology Vascular surgery  Procedures  None  Antibiotics   Anti-infectives (From admission, onward)   None      Subjective:   Joan Mosley seen and examined today.  Seen on BiPAP.  Able to answer simple questions.  Not having any shortness of breath or chest pain, abdominal pain, nausea or vomiting, dizziness or headache.   Objective:   Vitals:   11/12/18 0825 11/12/18 1129 11/12/18 1139 11/12/18 1140  BP:  121/84    Pulse: 73 77  76  Resp: 16 19  20   Temp:  97.6 F (36.4 C)    TempSrc:  Oral    SpO2: 98% 99% 96% 96%  Weight:      Height:        Intake/Output Summary (Last 24 hours) at 11/12/2018 1407 Last data filed at 11/12/2018 0400 Gross per 24 hour  Intake 80 ml  Output --  Net  80 ml   Filed Weights   11/10/18 1557  Weight: 113.4 kg    Exam  General: Well developed, chronically ill-appearing, NAD  HEENT: NCAT, on BiPAP  Cardiovascular: S1 S2 auscultated,RRR  Respiratory: Diminished breath sounds with minimal air movement heard.  Abdomen: Soft, obese, nontender, nondistended, + bowel sounds  Extremities: warm dry without cyanosis clubbing.  + LE Edema  Neuro: AAOx3, nonfocal  Psych: Normal affect and demeanor   Data Reviewed: I have personally reviewed following labs and imaging studies  CBC: Recent Labs  Lab 11/10/18 1706 11/10/18 1713 11/11/18 0639 11/11/18 0939  WBC 11.7*  --   --  13.2*  NEUTROABS 9.5*  --   --   --   HGB 9.3* 10.2* 10.5* 10.7*  HCT 31.2* 30.0* 31.0* 35.9*  MCV 102.6*  --   --  102.3*  PLT 180  --   --  [id number]   Basic Metabolic Panel: Recent Labs  Lab 11/10/18 1706 11/10/18 1713 11/11/18 0639 11/11/18 0939  NA 137 135 129* 135  K 5.2* 5.1 6.6* 4.4  CL 94*  --   --  95*  CO2 27  --   --  26  GLUCOSE 97  --   --  140*  BUN 37*  --   --  21  CREATININE 9.43*  --   --  5.65*  CALCIUM 8.3*  --   --  8.2*  PHOS  --   --   --  5.3*   GFR: Estimated Creatinine Clearance: 10.1 mL/min (A) (by C-G formula based on SCr of 5.65 mg/dL (H)). Liver Function Tests: Recent Labs  Lab 11/11/18 0939  ALBUMIN 3.4*   No results for input(s): LIPASE, AMYLASE in the last 168 hours. No results for input(s): AMMONIA in the last 168 hours. Coagulation Profile: No results for input(s): INR, PROTIME in the last 168 hours. Cardiac Enzymes: No results for input(s): CKTOTAL, CKMB, CKMBINDEX, TROPONINI in the last 168 hours. BNP (last 3 results) No results for input(s): PROBNP in the last 8760 hours. HbA1C: No results for input(s): HGBA1C in the last 72 hours. CBG: Recent Labs  Lab 11/11/18 0058 11/11/18 1832 11/11/18 2120 11/12/18 0754 11/12/18 1128  GLUCAP 113* 124* 137* 149* 134*   Lipid Profile: No results for input(s): CHOL, HDL, LDLCALC, TRIG, CHOLHDL, LDLDIRECT in the last 72 hours. Thyroid Function Tests: Recent Labs    11/11/18 0254  TSH 6.010*   Anemia Panel: No results for input(s): VITAMINB12, FOLATE, FERRITIN, TIBC, IRON, RETICCTPCT in the last 72 hours. Urine analysis:    Component Value Date/Time   COLORURINE YELLOW 04/20/2018 0301   APPEARANCEUR HAZY (A) 04/20/2018 0301    LABSPEC 1.009 04/20/2018 0301   PHURINE 9.0 (H) 04/20/2018 0301   GLUCOSEU 150 (A) 04/20/2018 0301   HGBUR MODERATE (A) 04/20/2018 0301   BILIRUBINUR NEGATIVE 04/20/2018 0301   KETONESUR NEGATIVE 04/20/2018 0301   PROTEINUR 100 (A) 04/20/2018 0301   NITRITE NEGATIVE 04/20/2018 0301   LEUKOCYTESUR NEGATIVE 04/20/2018 0301   Sepsis Labs: @LABRCNTIP (procalcitonin:4,lacticidven:4)  ) Recent Results (from the past 240 hour(s))  SARS CORONAVIRUS 2 (TAT 6-24 HRS) Nasopharyngeal Nasopharyngeal Swab     Status: None   Collection Time: 11/10/18 11:32 PM   Specimen: Nasopharyngeal Swab  Result Value Ref Range Status   SARS Coronavirus 2 NEGATIVE NEGATIVE Final    Comment: (NOTE) SARS-CoV-2 target nucleic acids are NOT DETECTED. The SARS-CoV-2 RNA is generally detectable in upper and lower respiratory specimens during the acute  phase of infection. Negative results do not preclude SARS-CoV-2 infection, do not rule out co-infections with other pathogens, and should not be used as the sole basis for treatment or other patient management decisions. Negative results must be combined with clinical observations, patient history, and epidemiological information. The expected result is Negative. Fact Sheet for Patients: SugarRoll.be Fact Sheet for Healthcare Providers: https://www.woods-mathews.com/ This test is not yet approved or cleared by the [place] FDA and  has been authorized for detection and/or diagnosis of SARS-CoV-2 by FDA under an Emergency Use Authorization (EUA). This EUA will remain  in effect (meaning this test can be used) for the duration of the COVID-19 declaration under Section 56 4(b)(1) of the Act, 21 U.S.C. section 360bbb-3(b)(1), unless the authorization is terminated or revoked sooner. Performed at [facility], [place] [address], [place], [place] [address]   Culture, blood (routine x 2)     Status: None (Preliminary  result)   Collection Time: 11/11/18  4:36 PM   Specimen: BLOOD LEFT ARM  Result Value Ref Range Status   Specimen Description BLOOD LEFT ARM  Final   Special Requests   Final    BOTTLES DRAWN AEROBIC AND ANAEROBIC Blood Culture adequate volume   Culture   Final    NO GROWTH < 24 HOURS Performed at [facility], [place] [address], [place], [place] [address]    Report Status PENDING  Incomplete  Culture, blood (routine x 2)     Status: None (Preliminary result)   Collection Time: 11/11/18  4:36 PM   Specimen: BLOOD LEFT ARM  Result Value Ref Range Status   Specimen Description BLOOD LEFT ARM  Final   Special Requests   Final    AEROBIC BOTTLE ONLY Blood Culture results may not be optimal due to an inadequate volume of blood received in culture bottles   Culture   Final    NO GROWTH < 24 HOURS Performed at [facility], [place] [address] [place], [place] [address]    Report Status PENDING  Incomplete      Radiology Studies: Dg Chest 2 View  Result Date: 11/10/2018 CLINICAL DATA:  Nonproductive cough for 3 days. COPD. EXAM: CHEST - 2 VIEW COMPARISON:  09/24/2018 FINDINGS: Right jugular dual-lumen central venous catheter remains in appropriate position. Heart size is stable. Mild atelectasis or scarring is seen at both lung bases, without significant change. No evidence of pulmonary consolidation or pleural effusion. IMPRESSION: Stable mild bibasilar atelectasis or scarring. Electronically Signed   By: Marlaine Hind M.D.   On: 11/10/2018 18:45   Ct Angio Chest Pe W And/or Wo Contrast  Result Date: 11/10/2018 CLINICAL DATA:  PE suspected, low pretest probability EXAM: CT ANGIOGRAPHY CHEST WITH CONTRAST TECHNIQUE: Multidetector CT imaging of the chest was performed using the standard protocol during bolus administration of intravenous contrast. Multiplanar CT image reconstructions and MIPs were obtained to evaluate the vascular anatomy. CONTRAST:  142mL OMNIPAQUE IOHEXOL 350 MG/ML  SOLN COMPARISON:  CTA chest 09/11/2018 FINDINGS: Cardiovascular: Satisfactory opacification of the pulmonary arteries to the segmental level. No pulmonary arterial filling defects are visualized. Central pulmonary arterial enlargement is similar to prior exam and likely reflects chronic pulmonary artery hypertension. Suboptimal opacification of the thoracic aorta. Four vessel branching of the arch with the left vertebral artery arising directly from the arch. Atherosclerotic calcification is seen throughout the imaged aorta. Mild cardiomegaly with right atrial enlargement and reflux of contrast into the hepatic veins. Coronary artery calcifications  are present. Aortic leaflet calcifications are seen as well. Mediastinum/Nodes: Shotty mediastinal adenopathy without enlarged axillary, hilar or mediastinal lymph nodes. Possibly reactive. Secretions are noted in the trachea with posterior bowing reflecting imaging during exhalation. No acute abnormality of the thoracic esophagus. 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 Remaining portions of the thoracic inlet are unremarkable. Lungs/Pleura: Bandlike areas of opacity in the lower lobes likely reflect areas of chronic atelectasis and/or scarring overall similar to the comparison study. Some increasing atelectatic changes noted in the right middle lobe. Centrilobular and paraseptal emphysema is present throughout the lungs most pronounced in the lung apices. Additional scarring and fibrosis is noted in the upper lungs with right apical bullous disease as well. Upper Abdomen: No acute abnormalities present in the visualized portions of the upper abdomen. Musculoskeletal: Multilevel degenerative changes are present in the imaged portions of the spine. Multilevel flowing anterior osteophytosis compatible features of diffuse idiopathic skeletal hyperostosis (DISH). Additional degenerative changes the shoulders. Rightward curvature of the  spine is likely positional. No acute osseous abnormality or suspicious osseous lesion. Review of the MIP images confirms the above findings. IMPRESSION: 1. No evidence of pulmonary embolism. 2. Chronic pulmonary artery hypertension. 3. Cardiomegaly with right atrial enlargement and reflux of contrast into the hepatic veins, suggestive of right heart failure. 4. Volume loss with consolidation in the lung bases and right middle lobe has an appearance most compatible with subsegmental atelectasis and/or scarring. 5. Biapical fibrotic changes. 6. Aortic Atherosclerosis (ICD10-I70.0) 7. Emphysema (ICD10-J43.9). Electronically Signed   By: Lovena Le M.D.   On: 11/10/2018 22:20     Scheduled Meds:  calcitRIOL  1.75 mcg Oral Once per day on Tue Thu Sat   Chlorhexidine Gluconate Cloth  6 each Topical Q0600   cinacalcet  60 mg Oral Once per day on Tue Thu Sat   FLUoxetine  10 mg Oral Daily   furosemide  80 mg Oral Once per day on Sun Mon Wed Fri   heparin  5,000 Units Subcutaneous Q8H   insulin aspart  0-9 Units Subcutaneous TID WC   insulin detemir  7 Units Subcutaneous Daily   ipratropium  0.5 mg Inhalation Q4H   levothyroxine  112 mcg Oral Q0600   methylPREDNISolone (SOLU-MEDROL) injection  40 mg Intravenous Q6H   montelukast  10 mg Oral QHS   rosuvastatin  40 mg Oral Daily   senna-docusate  1 tablet Oral QHS   sevelamer carbonate  1,600 mg Oral TID WC   Continuous Infusions:   LOS: 1 day   Time Spent in minutes   45 minutes  Fallou Hulbert D.O. on 11/12/2018 at 2:07 PM  Between 7am to 7pm - Please see pager noted on [email or web address]  After 7pm go to [email or web address]  And look for the night coverage person covering for me after hours  [facility] Office  [phone]

## 2018-11-12 NOTE — Significant Event (Addendum)
Rapid Response Event Note  Overview: Respiratory  Initial Focused Assessment: While I was on 6E, primary nurse asked if my thoughts about Joan Mosley coming off BIPAP so that she could eat something. Per nurse, patient was adamant that she eat something and nurse was concerned about patient coming off the BIPAP because earlier, when the patient was taken off, she became hypoxic, was very short breath and it took several minutes for the patient to recuperate (10 minutes per nurse).   I went in to see the patient because I have not seen her before. Patient was awake and alert, she was oriented, RR 22, when she talked (while on the BIPAP), she appeared labored at times BIPAP 12/6 50%. Lung sounds - diminished throughout, oxygen saturations 93%. Not in overt distress however I am concerned that if we take the patient off BIPAP, she will fatigue quickly. especially if she is trying to eat, I worry about her aspirating.   I spoke with RN and RT. We were all concerned. I paged the MD on call, spoke with the MD as well. Will keep on BIPAP for now, give the patient some time to improve and recuperate.   Interventions: -- None --   Plan of Care: -- Leave on BIPAP til morning. Perhaps can trial a wean in the morning.  -- Monitor VS and respiratory status.   -- At 1818, RN called with concerns that patient had taken off her BIPAP and dropped her oxygen into the 60s, she was placed on 6L [place] and she did not tolerate that and had to be placed back on the BIPAP. RN paged the MD and the RT as well. I went up to see her again, now 95% on the same BIPAP settings. Her son walked into the room with food and appeared irritated that she was on BIPAP and had not eaten, he stated " why can't you just turn the oxygen up all the way for her to eat?" I spent several minutes again explaining to the patient and her son, that since the patient is short of breath and labored on the BIPAP, it would not be safe for her to come off  right now to eat. I explained that should she come off and eat and then get into distress, it would no longer be safe for her to go on BIPAP and if warranted, she might to be intubated if she is in overt distress. I explicitly explained that her respiratory status (ie: Airway, Breathing) takes priority of eating. I did also explain that the patient is alert and oriented and given that her son is her HCPOA, they can make their own decisions and that I was just presenting the facts. I explained that she is at a high risk of aspiration given that her respiratory status is compromised.   Event Summary:  Start Time 1445  End Time 1745  2nd Time 1818 End Time 1845    Joan Mosley R

## 2018-11-12 NOTE — Progress Notes (Signed)
Patient placed on HFNC to give her a requested break from NIV. She has been started at 15L and maintaining a SpO2 of 98%. Will continue to wean to maintain SpO2 goal. Patient is agreeable to wear to sleep tonight. She is currently in no distress at this time and requesting to eat.

## 2018-11-12 NOTE — Progress Notes (Signed)
PT Cancellation Note  Patient Details Name: Joan Mosley MRN: [id number] DOB: 1946/01/21   Cancelled Treatment:    Reason Eval/Treat Not Completed: Medical issues which prohibited therapy. Pt remains on bipap. Pt also scheduled for HD today. PT to continue to follow and proceed with eval when pt able to participate.   [name] 11/12/2018, 11:30 AM  Lorrin Goodell, PT  Office # [phone] Pager [phone]

## 2018-11-13 LAB — RENAL FUNCTION PANEL
Albumin: 3 g/dL — ABNORMAL LOW (ref 3.5–5.0)
Anion gap: 15 (ref 5–15)
BUN: 32 mg/dL — ABNORMAL HIGH (ref 8–23)
CO2: 25 mmol/L (ref 22–32)
Calcium: 8.5 mg/dL — ABNORMAL LOW (ref 8.9–10.3)
Chloride: 98 mmol/L (ref 98–111)
Creatinine, Ser: 4.49 mg/dL — ABNORMAL HIGH (ref 0.44–1.00)
GFR calc Af Amer: 10 mL/min — ABNORMAL LOW (ref >60)
GFR calc non Af Amer: 9 mL/min — ABNORMAL LOW (ref >60)
Glucose, Bld: 172 mg/dL — ABNORMAL HIGH (ref 70–99)
Phosphorus: 7.6 mg/dL — ABNORMAL HIGH (ref 2.5–4.6)
Potassium: 4.9 mmol/L (ref 3.5–5.1)
Sodium: 138 mmol/L (ref 135–145)

## 2018-11-13 LAB — GLUCOSE, CAPILLARY
Glucose-Capillary: 155 mg/dL — ABNORMAL HIGH (ref 70–99)
Glucose-Capillary: 168 mg/dL — ABNORMAL HIGH (ref 70–99)
Glucose-Capillary: 244 mg/dL — ABNORMAL HIGH (ref 70–99)
Glucose-Capillary: 214 mg/dL — ABNORMAL HIGH (ref 70–99)

## 2018-11-13 LAB — CBC
HCT: 30.6 % — ABNORMAL LOW (ref 36.0–46.0)
Hemoglobin: 9.5 g/dL — ABNORMAL LOW (ref 12.0–15.0)
MCH: 30.8 pg (ref 26.0–34.0)
MCHC: 31 g/dL (ref 30.0–36.0)
MCV: 99.4 fL (ref 80.0–100.0)
Platelets: 190 10*3/uL (ref 150–400)
RBC: 3.08 MIL/uL — ABNORMAL LOW (ref 3.87–5.11)
RDW: 17.5 % — ABNORMAL HIGH (ref 11.5–15.5)
WBC: 9.7 10*3/uL (ref 4.0–10.5)
nRBC: 0.2 % (ref 0.0–0.2)

## 2018-11-13 MED ORDER — NEPRO/CARBSTEADY PO LIQD
237.0000 mL | Freq: Two times a day (BID) | ORAL | Status: DC
  Administered 2018-11-17: 17:00:00 237 mL via ORAL

## 2018-11-13 MED ORDER — DARBEPOETIN ALFA 25 MCG/0.42ML IJ SOSY
25.0000 ug | PREFILLED_SYRINGE | INTRAMUSCULAR | Status: DC
  Administered 2018-11-15: 25 ug via INTRAVENOUS
  Filled 2018-11-13: qty 0.42

## 2018-11-13 MED ORDER — RENA-VITE PO TABS
1.0000 | ORAL_TABLET | Freq: Every day | ORAL | Status: DC
  Administered 2018-11-13 – 2018-11-19 (×6): 1 via ORAL
  Filled 2018-11-13 (×6): qty 1

## 2018-11-13 MED ORDER — IPRATROPIUM BROMIDE 0.02 % IN SOLN
0.5000 mg | Freq: Four times a day (QID) | RESPIRATORY_TRACT | Status: DC
  Administered 2018-11-13 – 2018-11-14 (×6): 0.5 mg via RESPIRATORY_TRACT
  Filled 2018-11-13 (×7): qty 2.5

## 2018-11-13 NOTE — Progress Notes (Signed)
Attempting to wean down pt Oxygen requirement via nasal cannula.  Pt placd at 15 L Hi flow with O2 saturationat 94-99 %. Had decreased it to 13 L per N/C and continued to maintain her O 2 saturation at 93-98 %. Pt  O2 saturation on follow up at 90-92% the patient son at this point had stated " Oh, I lowered her O2 at 9 L and she is doing well with it ". When asked how long had it been  Pt son stated ' Oh, about an hour ago". Pt O2 saturation  Maintaining at 91-93 %. Had spoken extensively with son [name] that under any circumstances He Is Not Allowed  to lower his mother's oxygen intake while pt is in the hospital . Only assigned health staff is allowed to do the same. Charge RN Janett Billow updated. Will update Dr. Ree Kida and RRT Kal.

## 2018-11-13 NOTE — Progress Notes (Addendum)
[facility] Progress Note   Subjective: C/O being hungry-demanding breakfast. Says she wants to go home.   Objective Vitals:   11/12/18 2040 11/12/18 2109 11/12/18 2344 11/13/18 0431  BP:  122/75  118/67  Pulse: 86 (!) 103 88 76  Resp: 20 17 20 19   Temp:  98.2 F (36.8 C)  98.7 F (37.1 C)  TempSrc:  Oral  Axillary  SpO2: 98% 94% 97% 95%  Weight:    103.2 kg  Height:       Physical Exam General: Chronically ill appearing older female.  Heart: S1,S2 RRR SR on monitor. Prominent neck veins, slight JVD Lungs: Still decreased in bases, better air movement today.  On O2, no WOB.  Abdomen: Obese, NT Extremities: woody appearance BLE, trace ankle edema. Dialysis Access: R[facility] Drsg intact  Additional Objective Labs: Basic Metabolic Panel: Recent Labs  Lab 11/10/18 1706  11/11/18 0639 11/11/18 0939 11/13/18 0516  NA 137   < > 129* 135 138  K 5.2*   < > 6.6* 4.4 4.9  CL 94*  --   --  95* 98  CO2 27  --   --  26 25  GLUCOSE 97  --   --  140* 172*  BUN 37*  --   --  21 32*  CREATININE 9.43*  --   --  5.65* 4.49*  CALCIUM 8.3*  --   --  8.2* 8.5*  PHOS  --   --   --  5.3* 7.6*   < > = values in this interval not displayed.   Liver Function Tests: Recent Labs  Lab 11/11/18 0939 11/13/18 0516  ALBUMIN 3.4* 3.0*   No results for input(s): LIPASE, AMYLASE in the last 168 hours. CBC: Recent Labs  Lab 11/10/18 1706  11/11/18 0639 11/11/18 0939 11/13/18 0516  WBC 11.7*  --   --  13.2* 9.7  NEUTROABS 9.5*  --   --   --   --   HGB 9.3*   < > 10.5* 10.7* 9.5*  HCT 31.2*   < > 31.0* 35.9* 30.6*  MCV 102.6*  --   --  102.3* 99.4  PLT 180  --   --  210 190   < > = values in this interval not displayed.   Blood Culture    Component Value Date/Time   SDES BLOOD LEFT ARM 11/11/2018 1636   SDES BLOOD LEFT ARM 11/11/2018 1636   SPECREQUEST  11/11/2018 1636    BOTTLES DRAWN AEROBIC AND ANAEROBIC Blood Culture adequate volume   SPECREQUEST  11/11/2018  1636    AEROBIC BOTTLE ONLY Blood Culture results may not be optimal due to an inadequate volume of blood received in culture bottles   CULT  11/11/2018 1636    NO GROWTH 2 DAYS Performed at [facility], [place] [address] [place], [place] [address]    CULT  11/11/2018 1636    NO GROWTH 2 DAYS Performed at [facility], [place] [address] [place], [place] [address]    REPTSTATUS PENDING 11/11/2018 1636   REPTSTATUS PENDING 11/11/2018 1636    Cardiac Enzymes: No results for input(s): CKTOTAL, CKMB, CKMBINDEX, TROPONINI in the last 168 hours. CBG: Recent Labs  Lab 11/11/18 2120 11/12/18 0754 11/12/18 1128 11/12/18 2115 11/13/18 0755  GLUCAP 137* 149* 134* 141* 168*   Iron Studies: No results for input(s): IRON, TIBC, TRANSFERRIN, FERRITIN in the last 72 hours. @lablastinr3 @ Studies/Results: Dg Chest [employer] [place]  Result Date: 11/12/2018 CLINICAL DATA:  73 year old female with history of shortness of breath. EXAM: PORTABLE CHEST 1 VIEW COMPARISON:  Chest x-ray 11/10/2018. FINDINGS: Right internal jugular PermCath with tips terminating in the right atrium and superior cavoatrial junction. Study is limited by severe patient rotation to the right. Atelectasis and/or consolidation in the right lower lobe. Left lung is clear. No pleural effusions. No evidence of pulmonary edema. Heart size appears normal. Aortic atherosclerosis. IMPRESSION: 1. New area of atelectasis and/or consolidation in the right lower lobe. 2. Aortic atherosclerosis. Electronically Signed   By: Vinnie Langton M.D.   On: 11/12/2018 18:25   Medications:  . calcitRIOL  1.75 mcg Oral Once per day on Tue Thu Sat  . Chlorhexidine Gluconate Cloth  6 each Topical Q0600  . cinacalcet  60 mg Oral Once per day on Tue Thu Sat  . FLUoxetine  10 mg Oral Daily  . furosemide  80 mg Oral Once per day on Sun Mon Wed Fri  . heparin  5,000 Units Subcutaneous Q8H  . insulin aspart  0-9 Units Subcutaneous TID WC  . insulin  detemir  7 Units Subcutaneous Daily  . ipratropium  0.5 mg Inhalation QID  . levothyroxine  112 mcg Oral Q0600  . methylPREDNISolone (SOLU-MEDROL) injection  40 mg Intravenous Q6H  . [START ON 11/15/2018] midodrine  10 mg Oral Q T,Th,Sa-HD  . montelukast  10 mg Oral QHS  . rosuvastatin  40 mg Oral Daily  . senna-docusate  1 tablet Oral QHS  . sevelamer carbonate  1,600 mg Oral TID WC     Dialysis Orders: [facility] 4[facility], EDW 99kg, 2K/2Ca, UFP #4, no heparin, TDC R chest - Calcitriol 1.14mcg PO q HD - Sensipar 60mg  PO q HD - Venofer 100 x 5 ordered - Mircera 100 q 2 weeks  Assessment/Plan: 1. Dyspnea: COPD/ OHS, acute on chronic resp failure, on 6L [place] at home. Serial HD Friday and Saturday. Got 3L Fri but only 800 cc Sat d/t BP drop into 80's. Up a bit by wts.  No sig CXR / CT findings.  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 Has been seen by [facility] this admission.   3. ESRD: Usual TTS sched - Next HD 10/20 on schedule.  4. Hypertension/volume- as above.  Should get midodrine before HD and mid run. Have ordered for next tx 5. Anemia:Hgb 9.5. Give low dose Aranesp next tx.   6. Metabolic bone disease: Ca 8.5 /Phos 7.6 Continue home binders/VDRA/sensipar. 7. Hypothyroidism: Continue synthroid.  7. T2DM   8. Nutrition-has been NPO while on BIPAP. Renal/Carb mod diet ordered. Albumin 3.0 Renal vits, nepro.   Rita H. Brown NP-C 11/13/2018, 8:30 AM  [facility] [phone]  Pt seen, examined and agree w A/P as above.  Kelly Splinter  MD 11/13/2018, 2:54 PM

## 2018-11-13 NOTE — Progress Notes (Signed)
PROGRESS NOTE    Devory Mosley  [id number] DOB: February 26, 1945 DOA: 11/10/2018 PCP: Flossie Buffy, NP   Brief Narrative:  HPI on 11/10/2018 by Dr. Ivor Costa Joan Mosley is a 73 y.o. female with medical history significant of pulmonary hypertension, COPD on 6L oxygen at home, hyperlipidemia, diabetes mellitus, TIA, hypothyroidism, depression, ESRD-HD (TTS), SHD recliner bound, obesity, who presents with generalized weakness and shortness of breath.  Patient states that she has been feeling bad, with generalized weakness in the past several days.  She has shortness of breath, which has been progressively worsening.  She has dry cough and wheezing.  Denies chest pain.  Patient has nausea, vomited few times, currently no vomiting.  Denies abdominal pain or diarrhea.  No symptoms of UTI or unilateral weakness. Per EDP, pt has oxygen desaturation to upper 80s on home 6 L oxygen in ED initially, which improved to 100% on NRB, then 95% on 6L of nasal cannula oxygen. The patient also endorses a problem with her HD access. She normally goes to dialysis TuThSat and access is obtained via a right chest wall PermCath. She had partial dialysis on Tuesday since she was told her port was clotted.   Interim history Patient here with acute on chronic respiratory failure with hypoxia and hypercapnia.  Placed on BiPAP.  Unable to wean at this time as patient has desaturation levels into the 50s.  Suspect is secondary to volume overload.  Patient also with ESRD and noted to have PermCath clot.  Vascular surgery consulted and appreciated as well as nephrology.   Assessment & Plan   Acute on chronic hypoxic and hypercapnic respiratory failure -Likely multifactorial including COPD exacerbation as well as hypervolemia -CTA negative for PE -COVID-19 negative  -Continue Solu-Medrol, Mucinex, incentive spirometry, Lasix -Has required BiPAP, now transitioned to hiflo -Of note, patient on 6 L of home oxygen at  baseline  Hypothyroidism -Continue Synthroid  ESRD -Patient on hemodialysis on Tuesday, Thursday, Saturday -On admission, right chest wall PermCath was clotted -Nephrology and vascular surgery consulted and appreciated -possible exchange?  History of TIA -Continue statin -Unclear why patient is not on aspirin, will discuss with patient when she is more alert  Hyperlipidemia -Continue statin  Diabetes mellitus, type II -Continue Levemir, insulin sliding scale with CBG monitoring -Last hemoglobin A1c 6.1 on 07/01/2018  Depression anxiety -Continue home medications  Hyperkalemia -resolved   Deconditioning -Likely multifactorial including COPD, obesity -PT recommending home health  DVT Prophylaxis  heparin  Code Status: Full  Family Communication: None at bedside. Spoke with son [name] via phone on 10/17  Disposition Plan: Admitted. Pending improvement in respiratory status.  Consultants Nephrology Vascular surgery  Procedures  None  Antibiotics   Anti-infectives (From admission, onward)   None      Subjective:   Joan Mosley seen and examined today.  Patient wanting to eat today.  Stating that she does not need she wants to leave the hospital.  Feels her breathing is about the same, denies chest pain, abdominal pain, nausea or vomiting, diarrhea constipation. Objective:   Vitals:   11/13/18 0431 11/13/18 0757 11/13/18 0901 11/13/18 1118  BP: 118/67 123/73  120/69  Pulse: 76 74  91  Resp: 19 (!) 21  (!) 24  Temp: 98.7 F (37.1 C) 98.7 F (37.1 C)  98.1 F (36.7 C)  TempSrc: Axillary Oral  Oral  SpO2: 95% 98% 97% 94%  Weight: 103.2 kg     Height:  Intake/Output Summary (Last 24 hours) at 11/13/2018 1336 Last data filed at 11/12/2018 1547 Gross per 24 hour  Intake --  Output 855 ml  Net -855 ml   Filed Weights   11/12/18 1230 11/13/18 0431  Weight: 105.7 kg 103.2 kg   Exam  General: Well developed, chronically ill-appearing,  NAD  HEENT: NCAT,  mucous membranes moist.   Cardiovascular: S1 S2 auscultated, RRR  Respiratory: Diminished breath sounds  Abdomen: Soft, nontender, nondistended, + bowel sounds  Extremities: warm dry without cyanosis clubbing or edema  Neuro: AAOx3, nonfocal  Psych: Normal affect and demeanor  Data Reviewed: I have personally reviewed following labs and imaging studies  CBC: Recent Labs  Lab 11/10/18 1706 11/10/18 1713 11/11/18 0639 11/11/18 0939 11/13/18 0516  WBC 11.7*  --   --  13.2* 9.7  NEUTROABS 9.5*  --   --   --   --   HGB 9.3* 10.2* 10.5* 10.7* 9.5*  HCT 31.2* 30.0* 31.0* 35.9* 30.6*  MCV 102.6*  --   --  102.3* 99.4  PLT 180  --   --  210 [id number]   Basic Metabolic Panel: Recent Labs  Lab 11/10/18 1706 11/10/18 1713 11/11/18 0639 11/11/18 0939 11/13/18 0516  NA 137 135 129* 135 138  K 5.2* 5.1 6.6* 4.4 4.9  CL 94*  --   --  95* 98  CO2 27  --   --  26 25  GLUCOSE 97  --   --  140* 172*  BUN 37*  --   --  21 32*  CREATININE 9.43*  --   --  5.65* 4.49*  CALCIUM 8.3*  --   --  8.2* 8.5*  PHOS  --   --   --  5.3* 7.6*   GFR: Estimated Creatinine Clearance: 12 mL/min (A) (by C-G formula based on SCr of 4.49 mg/dL (H)). Liver Function Tests: Recent Labs  Lab 11/11/18 0939 11/13/18 0516  ALBUMIN 3.4* 3.0*   No results for input(s): LIPASE, AMYLASE in the last 168 hours. No results for input(s): AMMONIA in the last 168 hours. Coagulation Profile: No results for input(s): INR, PROTIME in the last 168 hours. Cardiac Enzymes: No results for input(s): CKTOTAL, CKMB, CKMBINDEX, TROPONINI in the last 168 hours. BNP (last 3 results) No results for input(s): PROBNP in the last 8760 hours. HbA1C: No results for input(s): HGBA1C in the last 72 hours. CBG: Recent Labs  Lab 11/12/18 0754 11/12/18 1128 11/12/18 2115 11/13/18 0755 11/13/18 1117  GLUCAP 149* 134* 141* 168* 244*   Lipid Profile: No results for input(s): CHOL, HDL, LDLCALC, TRIG,  CHOLHDL, LDLDIRECT in the last 72 hours. Thyroid Function Tests: Recent Labs    11/11/18 0254  TSH 6.010*   Anemia Panel: No results for input(s): VITAMINB12, FOLATE, FERRITIN, TIBC, IRON, RETICCTPCT in the last 72 hours. Urine analysis:    Component Value Date/Time   COLORURINE YELLOW 04/20/2018 0301   APPEARANCEUR HAZY (A) 04/20/2018 0301   LABSPEC 1.009 04/20/2018 0301   PHURINE 9.0 (H) 04/20/2018 0301   GLUCOSEU 150 (A) 04/20/2018 0301   HGBUR MODERATE (A) 04/20/2018 0301   BILIRUBINUR NEGATIVE 04/20/2018 0301   KETONESUR NEGATIVE 04/20/2018 0301   PROTEINUR 100 (A) 04/20/2018 0301   NITRITE NEGATIVE 04/20/2018 0301   LEUKOCYTESUR NEGATIVE 04/20/2018 0301   Sepsis Labs: @LABRCNTIP (procalcitonin:4,lacticidven:4)  ) Recent Results (from the past 240 hour(s))  SARS CORONAVIRUS 2 (TAT 6-24 HRS) Nasopharyngeal Nasopharyngeal Swab     Status: None   Collection  Time: 11/10/18 11:32 PM   Specimen: Nasopharyngeal Swab  Result Value Ref Range Status   SARS Coronavirus 2 NEGATIVE NEGATIVE Final    Comment: (NOTE) SARS-CoV-2 target nucleic acids are NOT DETECTED. The SARS-CoV-2 RNA is generally detectable in upper and lower respiratory specimens during the acute phase of infection. Negative results do not preclude SARS-CoV-2 infection, do not rule out co-infections with other pathogens, and should not be used as the sole basis for treatment or other patient management decisions. Negative results must be combined with clinical observations, patient history, and epidemiological information. The expected result is Negative. Fact Sheet for Patients: SugarRoll.be Fact Sheet for Healthcare Providers: https://www.woods-mathews.com/ This test is not yet approved or cleared by the [place] FDA and  has been authorized for detection and/or diagnosis of SARS-CoV-2 by FDA under an Emergency Use Authorization (EUA). This EUA will remain  in  effect (meaning this test can be used) for the duration of the COVID-19 declaration under Section 56 4(b)(1) of the Act, 21 U.S.C. section 360bbb-3(b)(1), unless the authorization is terminated or revoked sooner. Performed at [facility], [place] [address] [place], [place] [address]   Culture, blood (routine x 2)     Status: None (Preliminary result)   Collection Time: 11/11/18  4:36 PM   Specimen: BLOOD LEFT ARM  Result Value Ref Range Status   Specimen Description BLOOD LEFT ARM  Final   Special Requests   Final    BOTTLES DRAWN AEROBIC AND ANAEROBIC Blood Culture adequate volume   Culture   Final    NO GROWTH 2 DAYS Performed at [facility], [address] [place], [place] [address]    Report Status PENDING  Incomplete  Culture, blood (routine x 2)     Status: None (Preliminary result)   Collection Time: 11/11/18  4:36 PM   Specimen: BLOOD LEFT ARM  Result Value Ref Range Status   Specimen Description BLOOD LEFT ARM  Final   Special Requests   Final    AEROBIC BOTTLE ONLY Blood Culture results may not be optimal due to an inadequate volume of blood received in culture bottles   Culture   Final    NO GROWTH 2 DAYS Performed at [facility], [place] [address], [place], [place] [address]    Report Status PENDING  Incomplete      Radiology Studies: Dg Chest Port 1 View  Result Date: 11/12/2018 CLINICAL DATA:  73 year old female with history of shortness of breath. EXAM: PORTABLE CHEST 1 VIEW COMPARISON:  Chest x-ray 11/10/2018. FINDINGS: Right internal jugular PermCath with tips terminating in the right atrium and superior cavoatrial junction. Study is limited by severe patient rotation to the right. Atelectasis and/or consolidation in the right lower lobe. Left lung is clear. No pleural effusions. No evidence of pulmonary edema. Heart size appears normal. Aortic atherosclerosis. IMPRESSION: 1. New area of atelectasis and/or consolidation in the right lower lobe.  2. Aortic atherosclerosis. Electronically Signed   By: Vinnie Langton M.D.   On: 11/12/2018 18:25     Scheduled Meds:  calcitRIOL  1.75 mcg Oral Once per day on Tue Thu Sat   Chlorhexidine Gluconate Cloth  6 each Topical Q0600   cinacalcet  60 mg Oral Once per day on Tue Thu Sat   [START ON 11/15/2018] darbepoetin (ARANESP) injection - DIALYSIS  25 mcg Intravenous Q Tue-HD   feeding supplement (NEPRO CARB STEADY)  237 mL Oral BID BM   FLUoxetine  10  mg Oral Daily   furosemide  80 mg Oral Once per day on Sun Mon Wed Fri   heparin  5,000 Units Subcutaneous Q8H   insulin aspart  0-9 Units Subcutaneous TID WC   insulin detemir  7 Units Subcutaneous Daily   ipratropium  0.5 mg Inhalation QID   levothyroxine  112 mcg Oral Q0600   methylPREDNISolone (SOLU-MEDROL) injection  40 mg Intravenous Q6H   [START ON 11/15/2018] midodrine  10 mg Oral Q T,Th,Sa-HD   montelukast  10 mg Oral QHS   multivitamin  1 tablet Oral QHS   rosuvastatin  40 mg Oral Daily   senna-docusate  1 tablet Oral QHS   sevelamer carbonate  1,600 mg Oral TID WC   Continuous Infusions:   LOS: 2 days   Time Spent in minutes   45 minutes  Halley Kincer D.O. on 11/13/2018 at 1:36 PM  Between 7am to 7pm - Please see pager noted on [email or web address]  After 7pm go to [email or web address]  And look for the night coverage person covering for me after hours  [facility] Office  [phone]

## 2018-11-13 NOTE — Evaluation (Signed)
Physical Therapy Evaluation Patient Details Name: Joan Mosley MRN: [id number] DOB: 1945/04/25 Today's Date: 11/13/2018   History of Present Illness  Joan Mosley is a 73 y.o. female with medical history significant of pulmonary hypertension, COPD on 6L oxygen at home, hyperlipidemia, diabetes mellitus, TIA, hypothyroidism, depression, ESRD-HD (TTS),  recliner bound, obesity, who presents with generalized weakness and shortness of breath.  Clinical Impression   Pt admitted with above diagnosis. Prior to admission, pt was essentially recliner-bound, with getting family assist for transfers to wheelchair and to/from bedside commode;  Presents to PT with decr functional mobility, and significantly decr functional capacity; Desatted to 83% on 15 liters supplemental O2 via HFNC; recovered to 90% with 5 minutes of seated rest; Pt currently with functional limitations due to the deficits listed below (see PT Problem List). Pt will benefit from skilled PT to increase their independence and safety with mobility to allow discharge to the venue listed below.         Follow Up Recommendations Home health PT;Supervision/Assistance - 24 hour;Other (comment)(HHRN; While she is a good candidate for HHPT, she tells me that the last time PT came out to her house, they encouraged her to work on walking -- she communicated very clearly to me that she does not want to work on walking)    Equipment Recommendations  None recommended by PT(pretty well-equipped)    Recommendations for Other Services       Precautions / Restrictions Precautions Precautions: Fall Precaution Comments: Desats quickly      Mobility  Bed Mobility Overal bed mobility: Needs Assistance Bed Mobility: Supine to Sit     Supine to sit: Mod assist     General bed mobility comments: mod bilateral handheld assist to pull to sit; Pt lead and instructed me in the way they do it at home  Transfers Overall transfer level: Needs  assistance Equipment used: 1 person hand held assist Transfers: Lateral/Scoot Transfers;Stand Pivot Transfers   Stand pivot transfers: Mod assist      Lateral/Scoot Transfers: Min assist General transfer comment: Mod assist and support bilaterally, using gait belt around her waist; min/minguard assist for lateral scooting at EOB; short, inefficient scoots that were energetically taxing  Ambulation/Gait             General Gait Details: non-ambulatory  Stairs            Wheelchair Mobility    Modified Rankin (Stroke Patients Only)       Balance Overall balance assessment: Needs assistance   Sitting balance-Leahy Scale: Fair(approaching good)       Standing balance-Leahy Scale: Poor                               Pertinent Vitals/Pain Pain Assessment: Faces Faces Pain Scale: Hurts a little bit Pain Location: 'all over'; and pt mentioned arthritis in back and knees Pain Descriptors / Indicators: Grimacing;Guarding Pain Intervention(s): Monitored during session    Home Living Family/patient expects to be discharged to:: Private residence Living Arrangements: Children Available Help at Discharge: Family;Available 24 hours/day Type of Home: House Home Access: Ramped entrance     Home Layout: One level Home Equipment: Walker - 2 wheels;Cane - single point;Wheelchair - power;Electric scooter Additional Comments: supplemental O2 5L at baseline    Prior Function Level of Independence: Needs assistance   Gait / Transfers Assistance Needed: reports that her children assist with transfers; non-ambulatory for quite some time  ADL's / Homemaking Assistance Needed: Needs assist with bathing/dressing  Comments: medical van transport for HD     Hand Dominance   Dominant Hand: Right    Extremity/Trunk Assessment   Upper Extremity Assessment Upper Extremity Assessment: Generalized weakness    Lower Extremity Assessment Lower Extremity  Assessment: Generalized weakness(Chronic arthritis limiting bil knee flexion and tolerance of weight bearing)       Communication   Communication: HOH  Cognition Arousal/Alertness: Awake/alert Behavior During Therapy: WFL for tasks assessed/performed Overall Cognitive Status: History of cognitive impairments - at baseline                                 General Comments: Son in room, and he tells me she has short-term memory issues      General Comments General comments (skin integrity, edema, etc.): Session conducted on supplemental O2, 15 liters via HFNC; with activity and effort, O2 satas decr to 83%; incr back to 90% with 5 minutes of seated rest    Exercises     Assessment/Plan    PT Assessment Patient needs continued PT services  PT Problem List Decreased strength;Decreased range of motion;Decreased activity tolerance;Decreased balance;Decreased mobility;Decreased coordination;Decreased cognition;Decreased knowledge of use of DME;Decreased safety awareness;Decreased knowledge of precautions;Cardiopulmonary status limiting activity       PT Treatment Interventions DME instruction;Functional mobility training;Therapeutic activities;Therapeutic exercise;Balance training;Cognitive remediation;Patient/family education;Wheelchair mobility training    PT Goals (Current goals can be found in the Care Plan section)  Acute Rehab PT Goals Patient Stated Goal: "I want to go home" PT Goal Formulation: With patient Time For Goal Achievement: 11/27/18 Potential to Achieve Goals: Good    Frequency Min 3X/week   Barriers to discharge        Co-evaluation               AM-PAC PT "6 Clicks" Mobility  Outcome Measure Help needed turning from your back to your side while in a flat bed without using bedrails?: A Little Help needed moving from lying on your back to sitting on the side of a flat bed without using bedrails?: A Lot Help needed moving to and from a bed  to a chair (including a wheelchair)?: A Little Help needed standing up from a chair using your arms (e.g., wheelchair or bedside chair)?: A Little Help needed to walk in hospital room?: Total Help needed climbing 3-5 steps with a railing? : Total 6 Click Score: 13    End of Session Equipment Utilized During Treatment: Gait belt Activity Tolerance: Patient tolerated treatment well Patient left: in chair;with call bell/phone within reach;with family/visitor present Nurse Communication: Mobility status PT Visit Diagnosis: Other abnormalities of gait and mobility (R26.89);Other (comment)(Decr funcitonal capacity)    Time: 1137-1208 PT Time Calculation (min) (ACUTE ONLY): 31 min   Charges:   PT Evaluation $PT Eval Moderate Complexity: 1 Mod PT Treatments $Therapeutic Activity: 8-22 mins        Roney Marion, PT  Acute Rehabilitation Services Pager [phone] Office [phone]   Colletta [place] 11/13/2018, 1:18 PM

## 2018-11-14 ENCOUNTER — Inpatient Hospital Stay (HOSPITAL_COMMUNITY): Payer: Medicare Other

## 2018-11-14 ENCOUNTER — Encounter (HOSPITAL_COMMUNITY): Payer: Self-pay | Admitting: Interventional Radiology

## 2018-11-14 HISTORY — PX: IR FLUORO GUIDE CV LINE RIGHT: IMG2283

## 2018-11-14 LAB — CBC
HCT: 32.8 % — ABNORMAL LOW (ref 36.0–46.0)
Hemoglobin: 9.7 g/dL — ABNORMAL LOW (ref 12.0–15.0)
MCH: 29.5 pg (ref 26.0–34.0)
MCHC: 29.6 g/dL — ABNORMAL LOW (ref 30.0–36.0)
MCV: 99.7 fL (ref 80.0–100.0)
Platelets: 201 10*3/uL (ref 150–400)
RBC: 3.29 MIL/uL — ABNORMAL LOW (ref 3.87–5.11)
RDW: 17 % — ABNORMAL HIGH (ref 11.5–15.5)
WBC: 9.1 10*3/uL (ref 4.0–10.5)
nRBC: 0.3 % — ABNORMAL HIGH (ref 0.0–0.2)

## 2018-11-14 LAB — RENAL FUNCTION PANEL
Albumin: 3.1 g/dL — ABNORMAL LOW (ref 3.5–5.0)
Anion gap: 17 — ABNORMAL HIGH (ref 5–15)
BUN: 69 mg/dL — ABNORMAL HIGH (ref 8–23)
CO2: 23 mmol/L (ref 22–32)
Calcium: 8.7 mg/dL — ABNORMAL LOW (ref 8.9–10.3)
Chloride: 96 mmol/L — ABNORMAL LOW (ref 98–111)
Creatinine, Ser: 6.58 mg/dL — ABNORMAL HIGH (ref 0.44–1.00)
GFR calc Af Amer: 7 mL/min — ABNORMAL LOW (ref >60)
GFR calc non Af Amer: 6 mL/min — ABNORMAL LOW (ref >60)
Glucose, Bld: 203 mg/dL — ABNORMAL HIGH (ref 70–99)
Phosphorus: 8.5 mg/dL — ABNORMAL HIGH (ref 2.5–4.6)
Potassium: 5.2 mmol/L — ABNORMAL HIGH (ref 3.5–5.1)
Sodium: 136 mmol/L (ref 135–145)

## 2018-11-14 LAB — GLUCOSE, CAPILLARY
Glucose-Capillary: 193 mg/dL — ABNORMAL HIGH (ref 70–99)
Glucose-Capillary: 200 mg/dL — ABNORMAL HIGH (ref 70–99)
Glucose-Capillary: 187 mg/dL — ABNORMAL HIGH (ref 70–99)
Glucose-Capillary: 228 mg/dL — ABNORMAL HIGH (ref 70–99)
Glucose-Capillary: 201 mg/dL — ABNORMAL HIGH (ref 70–99)

## 2018-11-14 MED ORDER — CHLORHEXIDINE GLUCONATE CLOTH 2 % EX PADS
6.0000 | MEDICATED_PAD | Freq: Every day | CUTANEOUS | Status: DC
  Administered 2018-11-15 – 2018-11-16 (×2): 6 via TOPICAL

## 2018-11-14 MED ORDER — VANCOMYCIN HCL IN DEXTROSE 1-5 GM/200ML-% IV SOLN
1000.0000 mg | INTRAVENOUS | Status: AC
  Administered 2018-11-14: 1000 mg via INTRAVENOUS
  Filled 2018-11-14: qty 200

## 2018-11-14 MED ORDER — VANCOMYCIN HCL IN DEXTROSE 1-5 GM/200ML-% IV SOLN
INTRAVENOUS | Status: AC
  Administered 2018-11-14: 15:00:00
  Filled 2018-11-14: qty 200

## 2018-11-14 MED ORDER — CHLORHEXIDINE GLUCONATE 4 % EX LIQD
CUTANEOUS | Status: DC | PRN
  Administered 2018-11-14: 1 via TOPICAL

## 2018-11-14 MED ORDER — CHLORHEXIDINE GLUCONATE 4 % EX LIQD
CUTANEOUS | Status: AC
  Filled 2018-11-14: qty 15

## 2018-11-14 MED ORDER — MIDODRINE HCL 5 MG PO TABS: 10.0000 mg | ORAL_TABLET | ORAL | Status: DC

## 2018-11-14 MED ORDER — HEPARIN SODIUM (PORCINE) 1000 UNIT/ML IJ SOLN
INTRAMUSCULAR | Status: AC
  Filled 2018-11-14: qty 1

## 2018-11-14 MED ORDER — HEPARIN SODIUM (PORCINE) 1000 UNIT/ML IJ SOLN
INTRAMUSCULAR | Status: DC | PRN
  Administered 2018-11-14: 3800 [IU] via INTRAVENOUS
  Administered 2018-11-15: 11:00:00 via INTRAVENOUS
  Administered 2018-11-17: 11:00:00 3800 [IU] via INTRAVENOUS

## 2018-11-14 MED ORDER — MIDODRINE HCL 5 MG PO TABS
10.0000 mg | ORAL_TABLET | ORAL | Status: DC | PRN
  Administered 2018-11-15 – 2018-11-17 (×4): 10 mg via ORAL

## 2018-11-14 MED ORDER — LIDOCAINE HCL 1 % IJ SOLN
INTRAMUSCULAR | Status: DC | PRN
  Administered 2018-11-14: 10 mL

## 2018-11-14 MED ORDER — LIDOCAINE HCL 1 % IJ SOLN
INTRAMUSCULAR | Status: AC
  Filled 2018-11-14: qty 20

## 2018-11-14 MED ORDER — METHYLPREDNISOLONE SODIUM SUCC 125 MG IJ SOLR
40.0000 mg | Freq: Two times a day (BID) | INTRAMUSCULAR | Status: DC
  Administered 2018-11-14 – 2018-11-18 (×7): 40 mg via INTRAVENOUS
  Filled 2018-11-14 (×7): qty 2

## 2018-11-14 NOTE — Progress Notes (Signed)
[facility] Progress Note   Subjective: went for Joan Mosley exchange this am, greatly appreciate IR assistance.   Objective Vitals:   11/13/18 1928 11/14/18 0029 11/14/18 0648 11/14/18 0815  BP:  119/61 (!) 143/76   Pulse:  75 72 72  Resp:  18 18 18   Temp:  98.2 F (36.8 C) (!) 97.5 F (36.4 C)   TempSrc:  Oral Oral   SpO2: 90% 94% 93% 98%  Weight:      Height:       Physical Exam General: Chronically ill appearing older female, better, up in chair, on [place] Heart: S1,S2 RRR SR on monitor. Prominent neck veins, slight JVD Lungs: sig wheezing w/ forced expiration, overall better tho Abdomen: Obese, NT Extremities: woody appearance BLE, trace ankle edema. Dialysis Access: [facility] Drsg intact  Additional Objective Labs: Basic Metabolic Panel: Recent Labs  Lab 11/11/18 0939 11/13/18 0516 11/14/18 0718  NA 135 138 136  K 4.4 4.9 5.2*  CL 95* 98 96*  CO2 26 25 23   GLUCOSE 140* 172* 203*  BUN 21 32* 69*  CREATININE 5.65* 4.49* 6.58*  CALCIUM 8.2* 8.5* 8.7*  PHOS 5.3* 7.6* 8.5*   Liver Function Tests: Recent Labs  Lab 11/11/18 0939 11/13/18 0516 11/14/18 0718  ALBUMIN 3.4* 3.0* 3.1*   No results for input(s): LIPASE, AMYLASE in the last 168 hours. CBC: Recent Labs  Lab 11/10/18 1706  11/11/18 0939 11/13/18 0516 11/14/18 0718  WBC 11.7*  --  13.2* 9.7 9.1  NEUTROABS 9.5*  --   --   --   --   HGB 9.3*   < > 10.7* 9.5* 9.7*  HCT 31.2*   < > 35.9* 30.6* 32.8*  MCV 102.6*  --  102.3* 99.4 99.7  PLT 180  --  210 190 201   < > = values in this interval not displayed.   Blood Culture    Component Value Date/Time   SDES BLOOD LEFT ARM 11/11/2018 1636   SDES BLOOD LEFT ARM 11/11/2018 1636   SPECREQUEST  11/11/2018 1636    BOTTLES DRAWN AEROBIC AND ANAEROBIC Blood Culture adequate volume   SPECREQUEST  11/11/2018 1636    AEROBIC BOTTLE ONLY Blood Culture results may not be optimal due to an inadequate volume of blood received in culture bottles   CULT   11/11/2018 1636    NO GROWTH 3 DAYS Performed at [facility], [place] [address], [place], [place] [address]    CULT  11/11/2018 1636    NO GROWTH 3 DAYS Performed at [facility], [place] [address], [place], [place] [address]    REPTSTATUS PENDING 11/11/2018 1636   REPTSTATUS PENDING 11/11/2018 1636    Cardiac Enzymes: No results for input(s): CKTOTAL, CKMB, CKMBINDEX, TROPONINI in the last 168 hours. CBG: Recent Labs  Lab 11/13/18 0755 11/13/18 1117 11/13/18 1731 11/13/18 2137 11/14/18 0809  GLUCAP 168* 244* 214* 193* 200*   Iron Studies: No results for input(s): IRON, TIBC, TRANSFERRIN, FERRITIN in the last 72 hours. @lablastinr3 @ Studies/Results: Ir Fluoro Guide Cv Line Right  Result Date: 11/14/2018 CLINICAL DATA:  History of end-stage renal disease with poorly functioning indwelling tunneled hemodialysis catheter apparently placed a year and half ago. EXAM: EXCHANGE OF TUNNELED CENTRAL VENOUS HEMODIALYSIS CATHETER UNDER FLUOROSCOPIC GUIDANCE ANESTHESIA/SEDATION: None MEDICATIONS: 1 g IV vancomycin. IV antibiotic was given in a appropriate time interval prior to skin puncture. FLUOROSCOPY TIME:  18 seconds.  1.2 mGy. PROCEDURE: The procedure, risks, benefits, and alternatives  were explained to the patient. Questions regarding the procedure were encouraged and answered. The patient understands and consents to the procedure. A time-out was performed prior to initiating the procedure. The preexisting dialysis catheter and surrounding skin were prepped with chlorhexidine in a sterile fashion, and a sterile drape was applied covering the operative field. Maximum barrier sterile technique with sterile gowns and gloves were used for the procedure. Local anesthesia was provided with 1% lidocaine. The preexisting dialysis catheter was removed over a guidewire after freeing the subcutaneous cuff using blunt dissection. A new Palindrome tunneled hemodialysis catheter measuring 23 cm  from tip to cuff was chosen for placement. This was advanced over the guidewire under fluoroscopy. Final catheter positioning was confirmed and documented with a fluoroscopic spot image. The catheter was aspirated, flushed with saline, and injected with appropriate volume heparin dwells. COMPLICATIONS: None.  No pneumothorax. FINDINGS: After catheter placement, the tip lies in the right atrium. The catheter aspirates normally and is ready for immediate use. IMPRESSION: Exchange of tunneled hemodialysis catheter via the right internal jugular vein. A 23 cm tip to cuff length Palindrome catheter was placed with the tip in the right atrium. The catheter is ready for immediate use. Electronically Signed   By: Aletta Edouard M.D.   On: 11/14/2018 12:30   Dg Chest Port 1 View  Result Date: 11/12/2018 CLINICAL DATA:  73 year old female with history of shortness of breath. EXAM: PORTABLE CHEST 1 VIEW COMPARISON:  Chest x-ray 11/10/2018. FINDINGS: Right internal jugular PermCath with tips terminating in the right atrium and superior cavoatrial junction. Study is limited by severe patient rotation to the right. Atelectasis and/or consolidation in the right lower lobe. Left lung is clear. No pleural effusions. No evidence of pulmonary edema. Heart size appears normal. Aortic atherosclerosis. IMPRESSION: 1. New area of atelectasis and/or consolidation in the right lower lobe. 2. Aortic atherosclerosis. Electronically Signed   By: Vinnie Langton M.D.   On: 11/12/2018 18:25   Medications: . vancomycin     . calcitRIOL  1.75 mcg Oral Once per day on Tue Thu Sat  . chlorhexidine      . Chlorhexidine Gluconate Cloth  6 each Topical Q0600  . cinacalcet  60 mg Oral Once per day on Tue Thu Sat  . [START ON 11/15/2018] darbepoetin (ARANESP) injection - DIALYSIS  25 mcg Intravenous Q Tue-HD  . feeding supplement (NEPRO CARB STEADY)  237 mL Oral BID BM  . FLUoxetine  10 mg Oral Daily  . furosemide  80 mg Oral Once per  day on Sun Mon Wed Fri  . heparin      . heparin  5,000 Units Subcutaneous Q8H  . insulin aspart  0-9 Units Subcutaneous TID WC  . insulin detemir  7 Units Subcutaneous Daily  . ipratropium  0.5 mg Inhalation QID  . levothyroxine  112 mcg Oral Q0600  . lidocaine      . methylPREDNISolone (SOLU-MEDROL) injection  40 mg Intravenous Q12H  . [START ON 11/15/2018] midodrine  10 mg Oral Q T,Th,Sa-HD  . montelukast  10 mg Oral QHS  . multivitamin  1 tablet Oral QHS  . rosuvastatin  40 mg Oral Daily  . senna-docusate  1 tablet Oral QHS  . sevelamer carbonate  1,600 mg Oral TID WC     Dialysis Orders: [facility]    99kg    2K/2Ca     P4   Heparin - none    TDC R chest (now replaced 11/14/18, IR) -  Calcitriol 1.52mcg PO q HD - Sensipar 60mg  PO q HD - Venofer 100 x 5 ordered - Mircera 100 q 2 weeks  Assessment/Plan: 1. Dyspnea: COPD/ OHS, acute on chronic resp failure, on 6L [place] at home. Due to combination of COPD/ OHS flare and some vol overload. Had serial HD Friday and Saturday. No sig edema on imaging. Improving wheezing on IV steroids/ nebs.  2. 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  Appreciate IR assist w/ changing out for new TDC.   3. ESRD: Usual TTS sched - Next HD tomorrow. UF to dry wt ~2-3kg 4. Hypertension/volume- as above.  Should get midodrine before HD and mid run. Have ordered for next tx 5. Anemia:Hgb 9.5. Give low dose Aranesp next tx.   6. Metabolic bone disease: Ca 8.5 /Phos 7.6 Continue home binders/VDRA/sensipar. 7. Hypothyroidism: Continue synthroid.  7. T2DM   8. Nutrition-has been NPO while on BIPAP. Renal/Carb mod diet ordered. Albumin 3.0 Renal vits, nepro      Kelly Splinter  MD 11/14/2018, 12:42 PM

## 2018-11-14 NOTE — Progress Notes (Signed)
PROGRESS NOTE    Joan Mosley  [id number] DOB: July 13, 1945 DOA: 11/10/2018 PCP: Flossie Buffy, NP   Brief Narrative:  HPI on 11/10/2018 by Dr. Ivor Costa Joan Mosley is a 73 y.o. female with medical history significant of pulmonary hypertension, COPD on 6L oxygen at home, hyperlipidemia, diabetes mellitus, TIA, hypothyroidism, depression, ESRD-HD (TTS), SHD recliner bound, obesity, who presents with generalized weakness and shortness of breath.  Patient states that she has been feeling bad, with generalized weakness in the past several days.  She has shortness of breath, which has been progressively worsening.  She has dry cough and wheezing.  Denies chest pain.  Patient has nausea, vomited few times, currently no vomiting.  Denies abdominal pain or diarrhea.  No symptoms of UTI or unilateral weakness. Per EDP, pt has oxygen desaturation to upper 80s on home 6 L oxygen in ED initially, which improved to 100% on NRB, then 95% on 6L of nasal cannula oxygen. The patient also endorses a problem with her HD access. She normally goes to dialysis TuThSat and access is obtained via a right chest wall PermCath. She had partial dialysis on Tuesday since she was told her port was clotted.   Interim history Patient here with acute on chronic respiratory failure with hypoxia and hypercapnia.  Placed on BiPAP.  Unable to wean at this time as patient has desaturation levels into the 50s.  Suspect is secondary to volume overload.  Patient also with ESRD and noted to have PermCath clot.  Vascular surgery consulted and appreciated as well as nephrology. Received tunneled HD catheter exchange today.  Assessment & Plan   Acute on chronic hypoxic and hypercapnic respiratory failure -Likely multifactorial including COPD exacerbation as well as hypervolemia -CTA negative for PE -COVID-19 negative  -Continue Solu-Medrol, Mucinex, incentive spirometry, Lasix -Has required BiPAP, now transitioned to hiflo  currently on 10 -goal oxygen saturation 90% -Of note, patient on 6 L of home oxygen at baseline  Hypothyroidism -Continue Synthroid  ESRD -Patient on hemodialysis on Tuesday, Thursday, Saturday -On admission, right chest wall PermCath was clotted -Nephrology and vascular surgery consulted and appreciated -IR consulted and appreciated, s/p tunneled HD catheter exchange  History of TIA -Continue statin -Unclear why patient is not on aspirin, will discuss with patient when she is more alert  Hyperlipidemia -Continue statin  Diabetes mellitus, type II -Continue Levemir, insulin sliding scale with CBG monitoring -Last hemoglobin A1c 6.1 on 07/01/2018  Depression anxiety -Continue home medications  Hyperkalemia -resolved   Deconditioning -Likely multifactorial including COPD, obesity -PT recommending home health  DVT Prophylaxis  heparin  Code Status: Full  Family Communication: None at bedside. Spoke with son Joan Mosley via phone on 10/17  Disposition Plan: Admitted. Pending improvement in respiratory status.  Consultants Nephrology Vascular surgery Interventional radiology  Procedures  Tunneled HD catheter exchange  Antibiotics   Anti-infectives (From admission, onward)   Start     Dose/Rate Route Frequency Ordered Stop   11/14/18 1133  vancomycin (VANCOCIN) 1-5 GM/200ML-% IVPB    Note to Pharmacy: Fredric Dine   : cabinet override      11/14/18 1133 11/14/18 2344   11/14/18 1100  vancomycin (VANCOCIN) IVPB 1000 mg/200 mL premix     1,000 mg 200 mL/hr over 60 Minutes Intravenous To Radiology 11/14/18 1027 11/14/18 1233      Subjective:   Joan Mosley seen and examined today.  Patient feels breathing has improved.  Currently receiving breathing treatment.  Denies current chest pain, abdominal pain, nausea  vomiting, diarrhea or constipation, dizziness or headache.  States she wants to go home soon. Objective:   Vitals:   11/13/18 1928 11/14/18 0029 11/14/18  0648 11/14/18 0815  BP:  119/61 (!) 143/76   Pulse:  75 72 72  Resp:  18 18 18   Temp:  98.2 F (36.8 C) (!) 97.5 F (36.4 C)   TempSrc:  Oral Oral   SpO2: 90% 94% 93% 98%  Weight:      Height:        Intake/Output Summary (Last 24 hours) at 11/14/2018 1233 Last data filed at 11/13/2018 2000 Gross per 24 hour  Intake 340 ml  Output --  Net 340 ml   Filed Weights   11/12/18 1230 11/13/18 0431  Weight: 105.7 kg 103.2 kg   Exam  General: Well developed, chronically ill-appearing, NAD  HEENT: NCAT, mucous membranes moist.   Cardiovascular: S1 S2 auscultated, RRR  Respiratory: Diminished breath sounds, end expiratory wheezing  Abdomen: Soft, obese, nontender, nondistended, + bowel sounds  Extremities: warm dry without cyanosis clubbing or edema  Neuro: AAOx3, nonfocal  Psych: Appropriate mood and affect   Data Reviewed: I have personally reviewed following labs and imaging studies  CBC: Recent Labs  Lab 11/10/18 1706 11/10/18 1713 11/11/18 0639 11/11/18 0939 11/13/18 0516 11/14/18 0718  WBC 11.7*  --   --  13.2* 9.7 9.1  NEUTROABS 9.5*  --   --   --   --   --   HGB 9.3* 10.2* 10.5* 10.7* 9.5* 9.7*  HCT 31.2* 30.0* 31.0* 35.9* 30.6* 32.8*  MCV 102.6*  --   --  102.3* 99.4 99.7  PLT 180  --   --  210 190 [id number]   Basic Metabolic Panel: Recent Labs  Lab 11/10/18 1706 11/10/18 1713 11/11/18 0639 11/11/18 0939 11/13/18 0516 11/14/18 0718  NA 137 135 129* 135 138 136  K 5.2* 5.1 6.6* 4.4 4.9 5.2*  CL 94*  --   --  95* 98 96*  CO2 27  --   --  26 25 23   GLUCOSE 97  --   --  140* 172* 203*  BUN 37*  --   --  21 32* 69*  CREATININE 9.43*  --   --  5.65* 4.49* 6.58*  CALCIUM 8.3*  --   --  8.2* 8.5* 8.7*  PHOS  --   --   --  5.3* 7.6* 8.5*   GFR: Estimated Creatinine Clearance: 8.2 mL/min (A) (by C-G formula based on SCr of 6.58 mg/dL (H)). Liver Function Tests: Recent Labs  Lab 11/11/18 0939 11/13/18 0516 11/14/18 0718  ALBUMIN 3.4* 3.0* 3.1*    No results for input(s): LIPASE, AMYLASE in the last 168 hours. No results for input(s): AMMONIA in the last 168 hours. Coagulation Profile: No results for input(s): INR, PROTIME in the last 168 hours. Cardiac Enzymes: No results for input(s): CKTOTAL, CKMB, CKMBINDEX, TROPONINI in the last 168 hours. BNP (last 3 results) No results for input(s): PROBNP in the last 8760 hours. HbA1C: No results for input(s): HGBA1C in the last 72 hours. CBG: Recent Labs  Lab 11/13/18 0755 11/13/18 1117 11/13/18 1731 11/13/18 2137 11/14/18 0809  GLUCAP 168* 244* 214* 193* 200*   Lipid Profile: No results for input(s): CHOL, HDL, LDLCALC, TRIG, CHOLHDL, LDLDIRECT in the last 72 hours. Thyroid Function Tests: No results for input(s): TSH, T4TOTAL, FREET4, T3FREE, THYROIDAB in the last 72 hours. Anemia Panel: No results for input(s): VITAMINB12, FOLATE, FERRITIN, TIBC, IRON,  RETICCTPCT in the last 72 hours. Urine analysis:    Component Value Date/Time   COLORURINE YELLOW 04/20/2018 0301   APPEARANCEUR HAZY (A) 04/20/2018 0301   LABSPEC 1.009 04/20/2018 0301   PHURINE 9.0 (H) 04/20/2018 0301   GLUCOSEU 150 (A) 04/20/2018 0301   HGBUR MODERATE (A) 04/20/2018 0301   BILIRUBINUR NEGATIVE 04/20/2018 0301   KETONESUR NEGATIVE 04/20/2018 0301   PROTEINUR 100 (A) 04/20/2018 0301   NITRITE NEGATIVE 04/20/2018 0301   LEUKOCYTESUR NEGATIVE 04/20/2018 0301   Sepsis Labs: @LABRCNTIP (procalcitonin:4,lacticidven:4)  ) Recent Results (from the past 240 hour(s))  SARS CORONAVIRUS 2 (TAT 6-24 HRS) Nasopharyngeal Nasopharyngeal Swab     Status: None   Collection Time: 11/10/18 11:32 PM   Specimen: Nasopharyngeal Swab  Result Value Ref Range Status   SARS Coronavirus 2 NEGATIVE NEGATIVE Final    Comment: (NOTE) SARS-CoV-2 target nucleic acids are NOT DETECTED. The SARS-CoV-2 RNA is generally detectable in upper and lower respiratory specimens during the acute phase of infection. Negative results do  not preclude SARS-CoV-2 infection, do not rule out co-infections with other pathogens, and should not be used as the sole basis for treatment or other patient management decisions. Negative results must be combined with clinical observations, patient history, and epidemiological information. The expected result is Negative. Fact Sheet for Patients: SugarRoll.be Fact Sheet for Healthcare Providers: https://www.woods-mathews.com/ This test is not yet approved or cleared by the [place] FDA and  has been authorized for detection and/or diagnosis of SARS-CoV-2 by FDA under an Emergency Use Authorization (EUA). This EUA will remain  in effect (meaning this test can be used) for the duration of the COVID-19 declaration under Section 56 4(b)(1) of the Act, 21 U.S.C. section 360bbb-3(b)(1), unless the authorization is terminated or revoked sooner. Performed at [facility], [place] [address], [place], [place] [address]   Culture, blood (routine x 2)     Status: None (Preliminary result)   Collection Time: 11/11/18  4:36 PM   Specimen: BLOOD LEFT ARM  Result Value Ref Range Status   Specimen Description BLOOD LEFT ARM  Final   Special Requests   Final    BOTTLES DRAWN AEROBIC AND ANAEROBIC Blood Culture adequate volume   Culture   Final    NO GROWTH 3 DAYS Performed at [facility], [address], [place], [place] [address]    Report Status PENDING  Incomplete  Culture, blood (routine x 2)     Status: None (Preliminary result)   Collection Time: 11/11/18  4:36 PM   Specimen: BLOOD LEFT ARM  Result Value Ref Range Status   Specimen Description BLOOD LEFT ARM  Final   Special Requests   Final    AEROBIC BOTTLE ONLY Blood Culture results may not be optimal due to an inadequate volume of blood received in culture bottles   Culture   Final    NO GROWTH 3 DAYS Performed at [facility], [place] [address], [place], [place]  [address]    Report Status PENDING  Incomplete      Radiology Studies: Ir Fluoro Guide Cv Line Right  Result Date: 11/14/2018 CLINICAL DATA:  History of end-stage renal disease with poorly functioning indwelling tunneled hemodialysis catheter apparently placed a year and half ago. EXAM: EXCHANGE OF TUNNELED CENTRAL VENOUS HEMODIALYSIS CATHETER UNDER FLUOROSCOPIC GUIDANCE ANESTHESIA/SEDATION: None MEDICATIONS: 1 g IV vancomycin. IV antibiotic was given in a appropriate time interval prior to skin puncture. FLUOROSCOPY TIME:  18 seconds.  1.2 mGy.  PROCEDURE: The procedure, risks, benefits, and alternatives were explained to the patient. Questions regarding the procedure were encouraged and answered. The patient understands and consents to the procedure. A time-out was performed prior to initiating the procedure. The preexisting dialysis catheter and surrounding skin were prepped with chlorhexidine in a sterile fashion, and a sterile drape was applied covering the operative field. Maximum barrier sterile technique with sterile gowns and gloves were used for the procedure. Local anesthesia was provided with 1% lidocaine. The preexisting dialysis catheter was removed over a guidewire after freeing the subcutaneous cuff using blunt dissection. A new Palindrome tunneled hemodialysis catheter measuring 23 cm from tip to cuff was chosen for placement. This was advanced over the guidewire under fluoroscopy. Final catheter positioning was confirmed and documented with a fluoroscopic spot image. The catheter was aspirated, flushed with saline, and injected with appropriate volume heparin dwells. COMPLICATIONS: None.  No pneumothorax. FINDINGS: After catheter placement, the tip lies in the right atrium. The catheter aspirates normally and is ready for immediate use. IMPRESSION: Exchange of tunneled hemodialysis catheter via the right internal jugular vein. A 23 cm tip to cuff length Palindrome catheter was placed with the  tip in the right atrium. The catheter is ready for immediate use. Electronically Signed   By: Aletta Edouard M.D.   On: 11/14/2018 12:30   Dg Chest Port 1 View  Result Date: 11/12/2018 CLINICAL DATA:  73 year old female with history of shortness of breath. EXAM: PORTABLE CHEST 1 VIEW COMPARISON:  Chest x-ray 11/10/2018. FINDINGS: Right internal jugular PermCath with tips terminating in the right atrium and superior cavoatrial junction. Study is limited by severe patient rotation to the right. Atelectasis and/or consolidation in the right lower lobe. Left lung is clear. No pleural effusions. No evidence of pulmonary edema. Heart size appears normal. Aortic atherosclerosis. IMPRESSION: 1. New area of atelectasis and/or consolidation in the right lower lobe. 2. Aortic atherosclerosis. Electronically Signed   By: Vinnie Langton M.D.   On: 11/12/2018 18:25     Scheduled Meds:  calcitRIOL  1.75 mcg Oral Once per day on Tue Thu Sat   chlorhexidine       Chlorhexidine Gluconate Cloth  6 each Topical Q0600   cinacalcet  60 mg Oral Once per day on Tue Thu Sat   [START ON 11/15/2018] darbepoetin (ARANESP) injection - DIALYSIS  25 mcg Intravenous Q Tue-HD   feeding supplement (NEPRO CARB STEADY)  237 mL Oral BID BM   FLUoxetine  10 mg Oral Daily   furosemide  80 mg Oral Once per day on Sun Mon Wed Fri   heparin       heparin  5,000 Units Subcutaneous Q8H   insulin aspart  0-9 Units Subcutaneous TID WC   insulin detemir  7 Units Subcutaneous Daily   ipratropium  0.5 mg Inhalation QID   levothyroxine  112 mcg Oral Q0600   lidocaine       methylPREDNISolone (SOLU-MEDROL) injection  40 mg Intravenous Q6H   [START ON 11/15/2018] midodrine  10 mg Oral Q T,Th,Sa-HD   montelukast  10 mg Oral QHS   multivitamin  1 tablet Oral QHS   rosuvastatin  40 mg Oral Daily   senna-docusate  1 tablet Oral QHS   sevelamer carbonate  1,600 mg Oral TID WC   Continuous Infusions:  vancomycin        LOS: 3 days   Time Spent in minutes   30 minutes  Shaquayla Klimas D.O. on 11/14/2018 at 12:33  PM  Between 7am to 7pm - Please see pager noted on [email or web address]  After 7pm go to [email or web address]  And look for the night coverage person covering for me after hours  [facility] Office  [phone]

## 2018-11-14 NOTE — Procedures (Signed)
Interventional Radiology Procedure Note  Procedure: Tunneled HD catheter exchange  Complications: None  Estimated Blood Loss: < 10 mL  Findings: 23 cm tip to cuff length Palindrome catheter placed over wire after removal of old indwelling Bard catheter. Tip in RA. OK to use.  Venetia Night. Kathlene Cote, M.D Pager:  [phone]

## 2018-11-14 NOTE — Evaluation (Signed)
Occupational Therapy Evaluation Patient Details Name: Joan Mosley MRN: [id number] DOB: 1945-04-12 Today's Date: 11/14/2018    History of Present Illness Joan Mosley is a 73 y.o. female with medical history significant of pulmonary hypertension, COPD on 6L oxygen at home, hyperlipidemia, diabetes mellitus, TIA, hypothyroidism, depression, ESRD-HD (TTS),  recliner bound, obesity, who presents with generalized weakness and shortness of breath.   Clinical Impression    Pt with decline in function and safety with ADLs and ADL mobility with impaired strength, balance and endurance. Pt with hx of home O2 5 L. Pt reports that PTA, she lived at home with her children and that she is able to bathe/dress her UB, groom and toilet herself but requires extensive assist with LB ADLs/selfcare and that she spends most of her time in a recliner at home, but that she was able tom SPT w/c  And BSC with assist. Pt currently requires mod A to sit EOB with increased time/effort and O2 SATs dropping to 84% in 10 L O2. Pt required 5 minutes to rest with pursed lip breathing to recover to >88%. Pt able to simulate UB bathing min guard A , don clean gown with min guard A. Pt returned to sup with max A to get LEs back onto bed. Pt expressed her desire to go home ASAP and states that she will sign herself out today because she is tired of being in the hospital and wants to go home (RN and PA aware). Pt declines any HH therapies. Pt would benefit from acute OT services to address impairments to maximize level of function and safety     Follow Up Recommendations  No OT follow up;Supervision/Assistance - 24 hour(pt declined HH therapies)    Equipment Recommendations  3 in 1 bedside commode;Other (comment)(reacher)    Recommendations for Other Services       Precautions / Restrictions Precautions Precautions: Fall Precaution Comments: Desats quickly Restrictions Weight Bearing Restrictions: No Other  Position/Activity Restrictions: pt bed/recliner bound at home and states that she can SPT, but not interested in walking      Mobility Bed Mobility Overal bed mobility: Needs Assistance Bed Mobility: Supine to Sit;Sit to Supine     Supine to sit: Mod assist Sit to supine: Max assist   General bed mobility comments: mod A to elevate trunk, max A with LEs back onto bed  Transfers Overall transfer level: Needs assistance               General transfer comment: pt declined attempting to stand/transfer. Per PT note pt is able to SPT mod A    Balance Overall balance assessment: Needs assistance   Sitting balance-Leahy Scale: Fair         Standing balance comment: declined                           ADL either performed or assessed with clinical judgement   ADL Overall ADL's : Needs assistance/impaired Eating/Feeding: Independent;Sitting   Grooming: Wash/dry hands;Wash/dry face;Min guard;Sitting   Upper Body Bathing: Min guard;Sitting   Lower Body Bathing: Maximal assistance   Upper Body Dressing : Min guard;Sitting   Lower Body Dressing: Total assistance     Toilet Transfer Details (indicate cue type and reason): pt declined SPT to [facility]; pt can SPT mod A per PT note Toileting- Clothing Manipulation and Hygiene: Total assistance;Bed level               Vision Patient  Visual Report: No change from baseline       Perception     Praxis      Pertinent Vitals/Pain Faces Pain Scale: Hurts little more Pain Location: 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 Pain Descriptors / Indicators: Grimacing;Guarding Pain Intervention(s): Limited activity within patient's tolerance;Monitored during session;Repositioned     Hand Dominance Right   Extremity/Trunk Assessment Upper Extremity Assessment Upper Extremity Assessment: Generalized weakness   Lower Extremity Assessment Lower Extremity Assessment: Defer to PT evaluation       Communication  Communication Communication: HOH   Cognition Arousal/Alertness: Awake/alert Behavior During Therapy: WFL for tasks assessed/performed Overall Cognitive Status: History of cognitive impairments - at baseline                                     General Comments       Exercises     Shoulder Instructions      Home Living Family/patient expects to be discharged to:: Private residence Living Arrangements: Children Available Help at Discharge: Family;Available 24 hours/day Type of Home: House Home Access: Ramped entrance     Home Layout: One level     Bathroom Shower/Tub: Tub/shower unit;Walk-in shower         Home Equipment: Gilford Rile - 2 wheels;Cane - single point;Wheelchair - power;Electric scooter   Additional Comments: supplemental O2 5L at baseline      Prior Functioning/Environment Level of Independence: Needs assistance  Gait / Transfers Assistance Needed: reports that her children assist with transfers; non-ambulatory for quite some time ADL's / Homemaking Assistance Needed: Needs assist with bathing/dressing LB   Comments: medical Lucianne Lei transport for HD        OT Problem List: Decreased strength;Impaired balance (sitting and/or standing);Decreased cognition;Obesity;Cardiopulmonary status limiting activity;Decreased activity tolerance;Decreased knowledge of use of DME or AE      OT Treatment/Interventions: Self-care/ADL training;DME and/or AE instruction;Therapeutic activities;Patient/family education    OT Goals(Current goals can be found in the care plan section) Acute Rehab OT Goals Patient Stated Goal: "I want to go home" OT Goal Formulation: With patient Time For Goal Achievement: 11/28/18 ADL Goals Pt Will Perform Grooming: with supervision;with set-up;sitting;with caregiver independent in assisting Pt Will Perform Upper Body Bathing: with supervision;with set-up;sitting;with caregiver independent in assisting Pt Will Perform Upper Body  Dressing: with supervision;with set-up;sitting;with caregiver independent in assisting Pt Will Transfer to Toilet: with min assist;stand pivot transfer;bedside commode Additional ADL Goal #1: Pt will compleet bed mobility with min A to sit EOB for grooming and UB ADL tasks  OT Frequency: Min 2X/week   Barriers to D/C:            Co-evaluation              AM-PAC OT "6 Clicks" Daily Activity     Outcome Measure Help from another person eating meals?: None Help from another person taking care of personal grooming?: A Little Help from another person toileting, which includes using toliet, bedpan, or urinal?: Total Help from another person bathing (including washing, rinsing, drying)?: Total Help from another person to put on and taking off regular upper body clothing?: A Little Help from another person to put on and taking off regular lower body clothing?: Total 6 Click Score: 13   End of Session Equipment Utilized During Treatment: Oxygen  Activity Tolerance: Patient limited by fatigue Patient left: in bed;with call bell/phone within reach  OT Visit Diagnosis: Other abnormalities  of gait and mobility (R26.89);Muscle weakness (generalized) (M62.81);Pain Pain - Right/Left: (generalized) Pain - part of body: Knee(back)                Time: [id number] OT Time Calculation (min): 26 min Charges:  OT General Charges $OT Visit: 1 Visit OT Evaluation $OT Eval Moderate Complexity: 1 Mod OT Treatments $Self Care/Home Management : 8-22 mins    Britt Bottom 11/14/2018, 12:53 PM

## 2018-11-14 NOTE — Progress Notes (Signed)
Oxygen Saturation at 93 % on 11 L O2 via HiFlow  N/C. Will continue to monitor and wean down as needed keeping O2 saturation between 89% to 92 % as per instruction.

## 2018-11-14 NOTE — Care Management Important Message (Signed)
Important Message  Patient Details  Name: Joan Mosley MRN: [id number] Date of Birth: 04-12-1945   Medicare Important Message Given:  Yes     Shelda Altes 11/14/2018, 1:45 PM

## 2018-11-14 NOTE — Progress Notes (Addendum)
Pt had strangled with her food while she was eating. O2 saturation had dipped to 71 % at 10 L per  N/C. Pt O2 raised to up up 13 L . O2 saturation  Now at 88 %  To 92 %. Dr. Ree Kida will be updated with event via [place].

## 2018-11-15 LAB — RENAL FUNCTION PANEL
Albumin: 3.2 g/dL — ABNORMAL LOW (ref 3.5–5.0)
Anion gap: 20 — ABNORMAL HIGH (ref 5–15)
BUN: 92 mg/dL — ABNORMAL HIGH (ref 8–23)
CO2: 22 mmol/L (ref 22–32)
Calcium: 8.2 mg/dL — ABNORMAL LOW (ref 8.9–10.3)
Chloride: 92 mmol/L — ABNORMAL LOW (ref 98–111)
Creatinine, Ser: 8.15 mg/dL — ABNORMAL HIGH (ref 0.44–1.00)
GFR calc Af Amer: 5 mL/min — ABNORMAL LOW (ref >60)
GFR calc non Af Amer: 4 mL/min — ABNORMAL LOW (ref >60)
Glucose, Bld: 220 mg/dL — ABNORMAL HIGH (ref 70–99)
Phosphorus: 9.2 mg/dL — ABNORMAL HIGH (ref 2.5–4.6)
Potassium: 5.1 mmol/L (ref 3.5–5.1)
Sodium: 134 mmol/L — ABNORMAL LOW (ref 135–145)

## 2018-11-15 LAB — GLUCOSE, CAPILLARY
Glucose-Capillary: 207 mg/dL — ABNORMAL HIGH (ref 70–99)
Glucose-Capillary: 120 mg/dL — ABNORMAL HIGH (ref 70–99)
Glucose-Capillary: 187 mg/dL — ABNORMAL HIGH (ref 70–99)

## 2018-11-15 LAB — CBC
HCT: 32.5 % — ABNORMAL LOW (ref 36.0–46.0)
Hemoglobin: 10 g/dL — ABNORMAL LOW (ref 12.0–15.0)
MCH: 30.4 pg (ref 26.0–34.0)
MCHC: 30.8 g/dL (ref 30.0–36.0)
MCV: 98.8 fL (ref 80.0–100.0)
Platelets: 204 10*3/uL (ref 150–400)
RBC: 3.29 MIL/uL — ABNORMAL LOW (ref 3.87–5.11)
RDW: 17.1 % — ABNORMAL HIGH (ref 11.5–15.5)
WBC: 13 10*3/uL — ABNORMAL HIGH (ref 4.0–10.5)
nRBC: 0.2 % (ref 0.0–0.2)

## 2018-11-15 MED ORDER — MIDODRINE HCL 5 MG PO TABS
ORAL_TABLET | ORAL | Status: AC
  Administered 2018-11-15: 08:00:00 10 mg via ORAL
  Filled 2018-11-15: qty 4

## 2018-11-15 MED ORDER — HEPARIN SODIUM (PORCINE) 1000 UNIT/ML IJ SOLN
INTRAMUSCULAR | Status: AC
  Administered 2018-11-15: 11:00:00 via INTRAVENOUS
  Filled 2018-11-15: qty 4

## 2018-11-15 MED ORDER — DARBEPOETIN ALFA 25 MCG/0.42ML IJ SOSY
PREFILLED_SYRINGE | INTRAMUSCULAR | Status: AC
  Filled 2018-11-15: qty 0.42

## 2018-11-15 MED ORDER — IPRATROPIUM BROMIDE 0.02 % IN SOLN
0.5000 mg | Freq: Three times a day (TID) | RESPIRATORY_TRACT | Status: DC
  Administered 2018-11-15 – 2018-11-20 (×15): 0.5 mg via RESPIRATORY_TRACT
  Filled 2018-11-15 (×15): qty 2.5

## 2018-11-15 NOTE — Progress Notes (Signed)
PROGRESS NOTE    Joan Mosley  [id number] DOB: 12/17/1945 DOA: 11/10/2018 PCP: Flossie Buffy, NP   Brief Narrative:  HPI on 11/10/2018 by Dr. Ivor Costa Joan Mosley is a 73 y.o. female with medical history significant of pulmonary hypertension, COPD on 6L oxygen at home, hyperlipidemia, diabetes mellitus, TIA, hypothyroidism, depression, ESRD-HD (TTS), SHD recliner bound, obesity, who presents with generalized weakness and shortness of breath.  Patient states that she has been feeling bad, with generalized weakness in the past several days.  She has shortness of breath, which has been progressively worsening.  She has dry cough and wheezing.  Denies chest pain.  Patient has nausea, vomited few times, currently no vomiting.  Denies abdominal pain or diarrhea.  No symptoms of UTI or unilateral weakness. Per EDP, pt has oxygen desaturation to upper 80s on home 6 L oxygen in ED initially, which improved to 100% on NRB, then 95% on 6L of nasal cannula oxygen. The patient also endorses a problem with her HD access. She normally goes to dialysis TuThSat and access is obtained via a right chest wall PermCath. She had partial dialysis on Tuesday since she was told her port was clotted.   Interim history Patient here with acute on chronic respiratory failure with hypoxia and hypercapnia.  Placed on BiPAP.  Unable to wean at this time as patient has desaturation levels into the 50s.  Suspect is secondary to volume overload.  Patient also with ESRD and noted to have PermCath clot.  Vascular surgery consulted and appreciated as well as nephrology. Received tunneled HD catheter exchange.   Assessment & Plan   Acute on chronic hypoxic and hypercapnic respiratory failure -Likely multifactorial including COPD exacerbation as well as hypervolemia -CTA negative for PE -COVID-19 negative  -Continue Solu-Medrol, Mucinex, incentive spirometry, Lasix -Has required BiPAP, now transitioned to hiflo  currently on 10-13L -goal oxygen saturation 90% -Of note, patient on 6 L of home oxygen at baseline  Hypothyroidism -Continue Synthroid  ESRD -Patient on hemodialysis on Tuesday, Thursday, Saturday -On admission, right chest wall PermCath was clotted -Nephrology and vascular surgery consulted and appreciated -IR consulted and appreciated, s/p tunneled HD catheter exchange -HD today   History of TIA -Continue statin -Unclear why patient is not on aspirin  Hyperlipidemia -Continue statin  Diabetes mellitus, type II -Continue Levemir, insulin sliding scale with CBG monitoring -Last hemoglobin A1c 6.1 on 07/01/2018  Depression anxiety -Continue home medications  Hyperkalemia -resolved   Deconditioning -Likely multifactorial including COPD, obesity -PT recommending home health  DVT Prophylaxis  heparin  Code Status: Full  Family Communication: None at bedside.   Disposition Plan: Admitted. Pending improvement in respiratory status.  Consultants Nephrology Vascular surgery Interventional radiology  Procedures  Tunneled HD catheter exchange  Antibiotics   Anti-infectives (From admission, onward)   Start     Dose/Rate Route Frequency Ordered Stop   11/14/18 1133  vancomycin (VANCOCIN) 1-5 GM/200ML-% IVPB    Note to Pharmacy: Fredric Dine   : cabinet override      11/14/18 1133 11/14/18 1500   11/14/18 1100  vancomycin (VANCOCIN) IVPB 1000 mg/200 mL premix     1,000 mg 200 mL/hr over 60 Minutes Intravenous To Radiology 11/14/18 1027 11/14/18 1233      Subjective:   [name] seen and examined today in hemodialysis.  Patient states she wants to go home today.  She is tired of being in the hospital.  While in dialysis, patient's oxygen saturations did desat.  She denies  current chest pain or abdominal pain, nausea or vomiting, diarrhea or constipation. Objective:   Vitals:   11/15/18 1100 11/15/18 1109 11/15/18 1116 11/15/18 1418  BP: (!) 95/26 (!)  95/24 (!) 114/39 (!) 101/58  Pulse: 82 88 89 91  Resp: 20 (!) 28 (!) 21 20  Temp:   97.8 F (36.6 C) 97.8 F (36.6 C)  TempSrc:   Axillary Oral  SpO2: 96% 97% 96% 94%  Weight:   100.5 kg   Height:        Intake/Output Summary (Last 24 hours) at 11/15/2018 1501 Last data filed at 11/15/2018 1109 Gross per 24 hour  Intake 718 ml  Output 3000 ml  Net -2282 ml   Filed Weights   11/15/18 0605 11/15/18 0730 11/15/18 1116  Weight: 103.5 kg 103.5 kg 100.5 kg   Exam  General: Well developed, chronically ill-appearing, NAD  HEENT: NCAT, mucous membranes moist.   Cardiovascular: S1 S2 auscultated, RRR  Respiratory: Diminished breath sounds, end expiratory wheezing  Abdomen: Soft, obese, nontender, nondistended, + bowel sounds  Extremities: warm dry without cyanosis clubbing  Neuro: AAOx3, nonfocal  Psych: Appropriate mood and affect   Data Reviewed: I have personally reviewed following labs and imaging studies  CBC: Recent Labs  Lab 11/10/18 1706  11/11/18 0639 11/11/18 0939 11/13/18 0516 11/14/18 0718 11/15/18 0500  WBC 11.7*  --   --  13.2* 9.7 9.1 13.0*  NEUTROABS 9.5*  --   --   --   --   --   --   HGB 9.3*   < > 10.5* 10.7* 9.5* 9.7* 10.0*  HCT 31.2*   < > 31.0* 35.9* 30.6* 32.8* 32.5*  MCV 102.6*  --   --  102.3* 99.4 99.7 98.8  PLT 180  --   --  210 190 201 204   < > = values in this interval not displayed.   Basic Metabolic Panel: Recent Labs  Lab 11/10/18 1706  11/11/18 0639 11/11/18 0939 11/13/18 0516 11/14/18 0718 11/15/18 0802  NA 137   < > 129* 135 138 136 134*  K 5.2*   < > 6.6* 4.4 4.9 5.2* 5.1  CL 94*  --   --  95* 98 96* 92*  CO2 27  --   --  26 25 23 22   GLUCOSE 97  --   --  140* 172* 203* 220*  BUN 37*  --   --  21 32* 69* 92*  CREATININE 9.43*  --   --  5.65* 4.49* 6.58* 8.15*  CALCIUM 8.3*  --   --  8.2* 8.5* 8.7* 8.2*  PHOS  --   --   --  5.3* 7.6* 8.5* 9.2*   < > = values in this interval not displayed.   GFR: Estimated  Creatinine Clearance: 6.5 mL/min (A) (by C-G formula based on SCr of 8.15 mg/dL (H)). Liver Function Tests: Recent Labs  Lab 11/11/18 0939 11/13/18 0516 11/14/18 0718 11/15/18 0802  ALBUMIN 3.4* 3.0* 3.1* 3.2*   No results for input(s): LIPASE, AMYLASE in the last 168 hours. No results for input(s): AMMONIA in the last 168 hours. Coagulation Profile: No results for input(s): INR, PROTIME in the last 168 hours. Cardiac Enzymes: No results for input(s): CKTOTAL, CKMB, CKMBINDEX, TROPONINI in the last 168 hours. BNP (last 3 results) No results for input(s): PROBNP in the last 8760 hours. HbA1C: No results for input(s): HGBA1C in the last 72 hours. CBG: Recent Labs  Lab 11/14/18 0809 11/14/18  1242 11/14/18 1602 11/14/18 2128 11/15/18 0614  GLUCAP 200* 187* 228* 201* 207*   Lipid Profile: No results for input(s): CHOL, HDL, LDLCALC, TRIG, CHOLHDL, LDLDIRECT in the last 72 hours. Thyroid Function Tests: No results for input(s): TSH, T4TOTAL, FREET4, T3FREE, THYROIDAB in the last 72 hours. Anemia Panel: No results for input(s): VITAMINB12, FOLATE, FERRITIN, TIBC, IRON, RETICCTPCT in the last 72 hours. Urine analysis:    Component Value Date/Time   COLORURINE YELLOW 04/20/2018 0301   APPEARANCEUR HAZY (A) 04/20/2018 0301   LABSPEC 1.009 04/20/2018 0301   PHURINE 9.0 (H) 04/20/2018 0301   GLUCOSEU 150 (A) 04/20/2018 0301   HGBUR MODERATE (A) 04/20/2018 0301   BILIRUBINUR NEGATIVE 04/20/2018 0301   KETONESUR NEGATIVE 04/20/2018 0301   PROTEINUR 100 (A) 04/20/2018 0301   NITRITE NEGATIVE 04/20/2018 0301   LEUKOCYTESUR NEGATIVE 04/20/2018 0301   Sepsis Labs: @LABRCNTIP (procalcitonin:4,lacticidven:4)  ) Recent Results (from the past 240 hour(s))  SARS CORONAVIRUS 2 (TAT 6-24 HRS) Nasopharyngeal Nasopharyngeal Swab     Status: None   Collection Time: 11/10/18 11:32 PM   Specimen: Nasopharyngeal Swab  Result Value Ref Range Status   SARS Coronavirus 2 NEGATIVE NEGATIVE  Final    Comment: (NOTE) SARS-CoV-2 target nucleic acids are NOT DETECTED. The SARS-CoV-2 RNA is generally detectable in upper and lower respiratory specimens during the acute phase of infection. Negative results do not preclude SARS-CoV-2 infection, do not rule out co-infections with other pathogens, and should not be used as the sole basis for treatment or other patient management decisions. Negative results must be combined with clinical observations, patient history, and epidemiological information. The expected result is Negative. Fact Sheet for Patients: SugarRoll.be Fact Sheet for Healthcare Providers: https://www.woods-mathews.com/ This test is not yet approved or cleared by the [place] FDA and  has been authorized for detection and/or diagnosis of SARS-CoV-2 by FDA under an Emergency Use Authorization (EUA). This EUA will remain  in effect (meaning this test can be used) for the duration of the COVID-19 declaration under Section 56 4(b)(1) of the Act, 21 U.S.C. section 360bbb-3(b)(1), unless the authorization is terminated or revoked sooner. Performed at [facility], [place] [address] [place], [place] [address]   Culture, blood (routine x 2)     Status: None (Preliminary result)   Collection Time: 11/11/18  4:36 PM   Specimen: BLOOD LEFT ARM  Result Value Ref Range Status   Specimen Description BLOOD LEFT ARM  Final   Special Requests   Final    BOTTLES DRAWN AEROBIC AND ANAEROBIC Blood Culture adequate volume   Culture   Final    NO GROWTH 4 DAYS Performed at [facility], [place] [address] [place], [place] [address]    Report Status PENDING  Incomplete  Culture, blood (routine x 2)     Status: None (Preliminary result)   Collection Time: 11/11/18  4:36 PM   Specimen: BLOOD LEFT ARM  Result Value Ref Range Status   Specimen Description BLOOD LEFT ARM  Final   Special Requests   Final    AEROBIC BOTTLE ONLY  Blood Culture results may not be optimal due to an inadequate volume of blood received in culture bottles   Culture   Final    NO GROWTH 4 DAYS Performed at [facility], [place] [address], [place], [place] [address]    Report Status PENDING  Incomplete      Radiology Studies: Ir Fluoro Guide Cv Line Right  Result Date: 11/14/2018  CLINICAL DATA:  History of end-stage renal disease with poorly functioning indwelling tunneled hemodialysis catheter apparently placed a year and half ago. EXAM: EXCHANGE OF TUNNELED CENTRAL VENOUS HEMODIALYSIS CATHETER UNDER FLUOROSCOPIC GUIDANCE ANESTHESIA/SEDATION: None MEDICATIONS: 1 g IV vancomycin. IV antibiotic was given in a appropriate time interval prior to skin puncture. FLUOROSCOPY TIME:  18 seconds.  1.2 mGy. PROCEDURE: The procedure, risks, benefits, and alternatives were explained to the patient. Questions regarding the procedure were encouraged and answered. The patient understands and consents to the procedure. A time-out was performed prior to initiating the procedure. The preexisting dialysis catheter and surrounding skin were prepped with chlorhexidine in a sterile fashion, and a sterile drape was applied covering the operative field. Maximum barrier sterile technique with sterile gowns and gloves were used for the procedure. Local anesthesia was provided with 1% lidocaine. The preexisting dialysis catheter was removed over a guidewire after freeing the subcutaneous cuff using blunt dissection. A new Palindrome tunneled hemodialysis catheter measuring 23 cm from tip to cuff was chosen for placement. This was advanced over the guidewire under fluoroscopy. Final catheter positioning was confirmed and documented with a fluoroscopic spot image. The catheter was aspirated, flushed with saline, and injected with appropriate volume heparin dwells. COMPLICATIONS: None.  No pneumothorax. FINDINGS: After catheter placement, the tip lies in the right atrium. The  catheter aspirates normally and is ready for immediate use. IMPRESSION: Exchange of tunneled hemodialysis catheter via the right internal jugular vein. A 23 cm tip to cuff length Palindrome catheter was placed with the tip in the right atrium. The catheter is ready for immediate use. Electronically Signed   By: Aletta Edouard M.D.   On: 11/14/2018 12:30     Scheduled Meds:  calcitRIOL  1.75 mcg Oral Once per day on Tue Thu Sat   Chlorhexidine Gluconate Cloth  6 each Topical Q0600   Chlorhexidine Gluconate Cloth  6 each Topical Q0600   cinacalcet  60 mg Oral Once per day on Tue Thu Sat   darbepoetin (ARANESP) injection - DIALYSIS  25 mcg Intravenous Q Tue-HD   feeding supplement (NEPRO CARB STEADY)  237 mL Oral BID BM   FLUoxetine  10 mg Oral Daily   furosemide  80 mg Oral Once per day on Sun Mon Wed Fri   heparin  5,000 Units Subcutaneous Q8H   insulin aspart  0-9 Units Subcutaneous TID WC   insulin detemir  7 Units Subcutaneous Daily   ipratropium  0.5 mg Inhalation TID   levothyroxine  112 mcg Oral Q0600   methylPREDNISolone (SOLU-MEDROL) injection  40 mg Intravenous Q12H   montelukast  10 mg Oral QHS   multivitamin  1 tablet Oral QHS   rosuvastatin  40 mg Oral Daily   senna-docusate  1 tablet Oral QHS   sevelamer carbonate  1,600 mg Oral TID WC   Continuous Infusions:    LOS: 4 days   Time Spent in minutes   30 minutes  Jessyka Austria D.O. on 11/15/2018 at 3:01 PM  Between 7am to 7pm - Please see pager noted on [email or web address]  After 7pm go to [email or web address]  And look for the night coverage person covering for me after hours  [facility] Office  [phone]

## 2018-11-15 NOTE — Progress Notes (Signed)
PT Cancellation Note  Patient Details Name: Joan Mosley MRN: [id number] DOB: [birth year]   Cancelled Treatment:    Reason Eval/Treat Not Completed: Patient declined, no reason specified;Fatigue/lethargy limiting ability to participate Pt just got back from HD and reports feeling tired, declines therapy services. Eager to get home today. Will follow.   Marguarite Arbour A Kailei Cowens 11/15/2018, 12:33 PM Wray Kearns, PT, DPT Acute Rehabilitation Services Pager [phone] Office [phone]

## 2018-11-15 NOTE — Progress Notes (Signed)
Hemodialysis- Patient eating sandwich. Began coughing. States "feels like mucus." Sats dropping to mid 80s. RT called. BP dropping as well. UF is currently off. Continue to monitor.

## 2018-11-15 NOTE — Progress Notes (Signed)
[facility] Progress Note   Subjective: on HD this am, no new c/o's. Breathing close to baseline per pt  Objective Vitals:   11/15/18 1100 11/15/18 1109 11/15/18 1116 11/15/18 1418  BP: (!) 95/26 (!) 95/24 (!) 114/39 (!) 101/58  Pulse: 82 88 89 91  Resp: 20 (!) 28 (!) 21 20  Temp:   97.8 F (36.6 C) 97.8 F (36.6 C)  TempSrc:   Axillary Oral  SpO2: 96% 97% 96% 94%  Weight:   100.5 kg   Height:       Physical Exam General: Chronically ill appearing older female, better, up in chair, on [place] Heart: S1,S2 RRR SR on monitor. Prominent neck veins, slight JVD Lungs: slight exp wheezing Abdomen: Obese, NT Extremities: woody appearance BLE, trace ankle edema. Dialysis Access: [facility] Drsg intact  Additional Objective Labs: Basic Metabolic Panel: Recent Labs  Lab 11/13/18 0516 11/14/18 0718 11/15/18 0802  NA 138 136 134*  K 4.9 5.2* 5.1  CL 98 96* 92*  CO2 25 23 22   GLUCOSE 172* 203* 220*  BUN 32* 69* 92*  CREATININE 4.49* 6.58* 8.15*  CALCIUM 8.5* 8.7* 8.2*  PHOS 7.6* 8.5* 9.2*   Liver Function Tests: Recent Labs  Lab 11/13/18 0516 11/14/18 0718 11/15/18 0802  ALBUMIN 3.0* 3.1* 3.2*   No results for input(s): LIPASE, AMYLASE in the last 168 hours. CBC: Recent Labs  Lab 11/10/18 1706  11/11/18 0939 11/13/18 0516 11/14/18 0718 11/15/18 0500  WBC 11.7*  --  13.2* 9.7 9.1 13.0*  NEUTROABS 9.5*  --   --   --   --   --   HGB 9.3*   < > 10.7* 9.5* 9.7* 10.0*  HCT 31.2*   < > 35.9* 30.6* 32.8* 32.5*  MCV 102.6*  --  102.3* 99.4 99.7 98.8  PLT 180  --  210 190 201 204   < > = values in this interval not displayed.   Blood Culture    Component Value Date/Time   SDES BLOOD LEFT ARM 11/11/2018 1636   SDES BLOOD LEFT ARM 11/11/2018 1636   SPECREQUEST  11/11/2018 1636    BOTTLES DRAWN AEROBIC AND ANAEROBIC Blood Culture adequate volume   SPECREQUEST  11/11/2018 1636    AEROBIC BOTTLE ONLY Blood Culture results may not be optimal due to an  inadequate volume of blood received in culture bottles   CULT  11/11/2018 1636    NO GROWTH 4 DAYS Performed at [facility], [place] [address] [place], [place] [address]    CULT  11/11/2018 1636    NO GROWTH 4 DAYS Performed at [facility], [place] [address] [place], [place] [address]    REPTSTATUS PENDING 11/11/2018 1636   REPTSTATUS PENDING 11/11/2018 1636    Cardiac Enzymes: No results for input(s): CKTOTAL, CKMB, CKMBINDEX, TROPONINI in the last 168 hours. CBG: Recent Labs  Lab 11/14/18 0809 11/14/18 1242 11/14/18 1602 11/14/18 2128 11/15/18 0614  GLUCAP 200* 187* 228* 201* 207*   Iron Studies: No results for input(s): IRON, TIBC, TRANSFERRIN, FERRITIN in the last 72 hours. @lablastinr3 @ Studies/Results: Ir Fluoro Guide Cv Line Right  Result Date: 11/14/2018 CLINICAL DATA:  History of end-stage renal disease with poorly functioning indwelling tunneled hemodialysis catheter apparently placed a year and half ago. EXAM: EXCHANGE OF TUNNELED CENTRAL VENOUS HEMODIALYSIS CATHETER UNDER FLUOROSCOPIC GUIDANCE ANESTHESIA/SEDATION: None MEDICATIONS: 1 g IV vancomycin. IV antibiotic was given in a appropriate time interval prior to skin puncture. FLUOROSCOPY TIME:  18 seconds.  1.2 mGy. PROCEDURE: The procedure, risks, benefits, and alternatives were explained to the patient. Questions regarding the procedure were encouraged and answered. The patient understands and consents to the procedure. A time-out was performed prior to initiating the procedure. The preexisting dialysis catheter and surrounding skin were prepped with chlorhexidine in a sterile fashion, and a sterile drape was applied covering the operative field. Maximum barrier sterile technique with sterile gowns and gloves were used for the procedure. Local anesthesia was provided with 1% lidocaine. The preexisting dialysis catheter was removed over a guidewire after freeing the subcutaneous cuff using blunt dissection. A  new Palindrome tunneled hemodialysis catheter measuring 23 cm from tip to cuff was chosen for placement. This was advanced over the guidewire under fluoroscopy. Final catheter positioning was confirmed and documented with a fluoroscopic spot image. The catheter was aspirated, flushed with saline, and injected with appropriate volume heparin dwells. COMPLICATIONS: None.  No pneumothorax. FINDINGS: After catheter placement, the tip lies in the right atrium. The catheter aspirates normally and is ready for immediate use. IMPRESSION: Exchange of tunneled hemodialysis catheter via the right internal jugular vein. A 23 cm tip to cuff length Palindrome catheter was placed with the tip in the right atrium. The catheter is ready for immediate use. Electronically Signed   By: Aletta Edouard M.D.   On: 11/14/2018 12:30   Medications:  . calcitRIOL  1.75 mcg Oral Once per day on Tue Thu Sat  . Chlorhexidine Gluconate Cloth  6 each Topical Q0600  . Chlorhexidine Gluconate Cloth  6 each Topical Q0600  . cinacalcet  60 mg Oral Once per day on Tue Thu Sat  . darbepoetin (ARANESP) injection - DIALYSIS  25 mcg Intravenous Q Tue-HD  . feeding supplement (NEPRO CARB STEADY)  237 mL Oral BID BM  . FLUoxetine  10 mg Oral Daily  . furosemide  80 mg Oral Once per day on Sun Mon Wed Fri  . heparin  5,000 Units Subcutaneous Q8H  . insulin aspart  0-9 Units Subcutaneous TID WC  . insulin detemir  7 Units Subcutaneous Daily  . ipratropium  0.5 mg Inhalation TID  . levothyroxine  112 mcg Oral Q0600  . methylPREDNISolone (SOLU-MEDROL) injection  40 mg Intravenous Q12H  . montelukast  10 mg Oral QHS  . multivitamin  1 tablet Oral QHS  . rosuvastatin  40 mg Oral Daily  . senna-docusate  1 tablet Oral QHS  . sevelamer carbonate  1,600 mg Oral TID WC     D[facility]: [facility]    99kg    2K/2Ca     P4   Heparin - none    TDC R chest (now replaced 11/14/18, IR) - Calcitriol 1.40mcg PO q HD - Sensipar 60mg  PO q HD -  Venofer 100 x 5 ordered - Mircera 100 q 2 weeks  Assessment/Plan: 1. Dyspnea: COPD/ OHS, acute on chronic resp failure, on 6L [place] at home. Due to combination of COPD/ OHS flare and some vol overload. Had serial HD Friday and Saturday. No sig edema on imaging. Improving wheezing on IV steroids/ nebs.  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  [place] removed/ replaced on 10/19, using today. Appreciate IR assist.  3. ESRD: Usual TTS sched - HD today, UF to dry 4. Hypertension/volume- as above.  Should get midodrine before HD and mid run. Have ordered for next tx 5. Anemia:Hgb 9.5. Give low dose Aranesp next tx.  6. Metabolic bone disease: Ca 8.5 /Phos 7.6 Continue home binders/VDRA/sensipar. 7. Hypothyroidism: Continue synthroid.  7. T2DM   8. Nutrition-has been NPO while on BIPAP. Renal/Carb mod diet ordered. Albumin 3.0 Renal vits, nepro   9.  Dispo - OK for dc from renal standpoint      Kelly Splinter  MD 11/15/2018, 3:37 PM

## 2018-11-16 ENCOUNTER — Inpatient Hospital Stay (HOSPITAL_COMMUNITY): Payer: Medicare Other

## 2018-11-16 LAB — GLUCOSE, CAPILLARY
Glucose-Capillary: 201 mg/dL — ABNORMAL HIGH (ref 70–99)
Glucose-Capillary: 162 mg/dL — ABNORMAL HIGH (ref 70–99)
Glucose-Capillary: 222 mg/dL — ABNORMAL HIGH (ref 70–99)
Glucose-Capillary: 182 mg/dL — ABNORMAL HIGH (ref 70–99)

## 2018-11-16 LAB — CULTURE, BLOOD (ROUTINE X 2)
Culture: NO GROWTH
Culture: NO GROWTH
Special Requests: ADEQUATE

## 2018-11-16 LAB — BLOOD GAS, ARTERIAL
Acid-Base Excess: 2 mmol/L (ref 0.0–2.0)
Bicarbonate: 26.8 mmol/L (ref 20.0–28.0)
Drawn by: 533091
O2 Content: 15 L/min
O2 Saturation: 92.5 %
Patient temperature: 98.6
pCO2 arterial: 47.7 mmHg (ref 32.0–48.0)
pH, Arterial: 7.369 (ref 7.350–7.450)
pO2, Arterial: 66.2 mmHg — ABNORMAL LOW (ref 83.0–108.0)

## 2018-11-16 MED ORDER — CHLORHEXIDINE GLUCONATE CLOTH 2 % EX PADS
6.0000 | MEDICATED_PAD | Freq: Every day | CUTANEOUS | Status: DC
  Administered 2018-11-18 – 2018-11-21 (×4): 6 via TOPICAL

## 2018-11-16 NOTE — Progress Notes (Signed)
Pt placed on Bipap per respiratory therapy. Pt tolerated well throughout the night, Sats 92-94%. Pt wanted Bipap removed at approx 0300 due to thirst. Mouth swabbed for comfort and pt encouraged to remain on Bipap. Pt remained on bipap till 0500 and was taken off for trial off and given small sips of liquid. Pt sats low 80 to mid 80's on 13L HFNC and pt with mild pursed lip breathing. Pt encouraged to allow Bipap to be placed back on -pt compliant. Will continue to monitor. Jessie Foot, RN

## 2018-11-16 NOTE — Progress Notes (Signed)
Patient refuses NIV for the night. Patient states that she does not want to use it. Will monitor as needed for respiratory distress.

## 2018-11-16 NOTE — Progress Notes (Signed)
PT Cancellation Note  Patient Details Name: Joan Mosley MRN: [id number] DOB: 06-01-1945   Cancelled Treatment:    Reason Eval/Treat Not Completed: Medical issues which prohibited therapy. Pt on bipap.    Shary Decamp [facility] 11/16/2018, 10:13 AM [place] Pager [phone] Office [phone]

## 2018-11-16 NOTE — Progress Notes (Signed)
[facility] Progress Note   Subjective: no new c/o's, still w/ desat's earlier today , see RN notes  Objective Vitals:   11/16/18 0618 11/16/18 0751 11/16/18 0756 11/16/18 0856  BP: 124/65 (!) 128/58    Pulse: 86 81  83  Resp: (!) 25 (!) 21  18  Temp:   98.3 F (36.8 C)   TempSrc:   Axillary   SpO2: 92% 92%  95%  Weight:      Height:       Physical Exam General: Chronically ill appearing older female, better, up in chair, on [place] Heart: S1,S2 RRR SR on monitor. Prominent neck veins, slight JVD Lungs: slight exp wheezing, no rales Abdomen: Obese, NT Extremities: woody appearance BLE, trace -1 + ankle edema. Dialysis Access: RIJ TDC Drsg intact   [facility]    99kg    2K/2Ca     P4   Heparin - none    TDC R chest (now replaced 11/14/18, IR) - Calcitriol 1.79mcg PO q HD - Sensipar 60mg  PO q HD - Venofer 100 x 5 ordered - Mircera 100 q 2 weeks  Assessment/Plan: 1. Dyspnea: COPD/ OHS, acute on chronic resp failure, on 6L [place] at home. Due to combination of COPD/ OHS flare and vol overload. Was 7kg up on admission, now is at dry wt after HDx 3. CXR has been w/o edema. Plan HD in am, max UF w/ midodrine as tolerated, lower dry wt as tol.  On IV steroids/ nebs as well. Have d/w pmd, may need pulm input if still having sig desat after HD tomorrow.  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  TDC removed/ replaced on 10/19, used 10/20 w/o difficulty. Appreciate IR assist.  3. ESRD: Usual TTS sched - HD today, UF to dry 4. Hypertension/volume- as above. Gets midodrine before HD and mid run. Have ordered for next tx 5. Anemia:Hgb 9.5. Give low dose Aranesp next tx.   6. Metabolic bone disease: Ca 8.5 /Phos 7.6 Continue home binders/VDRA/sensipar. 7. Hypothyroidism: Continue synthroid.  7. T2DM   8. Nutrition- Renal/Carb mod diet ordered. Albumin 3.0 Renal vits, nepro        Joan Splinter  MD 11/16/2018, 1:48  PM  Additional Objective Labs: Basic Metabolic Panel: Recent Labs  Lab 11/13/18 0516 11/14/18 0718 11/15/18 0802  NA 138 136 134*  K 4.9 5.2* 5.1  CL 98 96* 92*  CO2 25 23 22   GLUCOSE 172* 203* 220*  BUN 32* 69* 92*  CREATININE 4.49* 6.58* 8.15*  CALCIUM 8.5* 8.7* 8.2*  PHOS 7.6* 8.5* 9.2*   Liver Function Tests: Recent Labs  Lab 11/13/18 0516 11/14/18 0718 11/15/18 0802  ALBUMIN 3.0* 3.1* 3.2*   No results for input(s): LIPASE, AMYLASE in the last 168 hours. CBC: Recent Labs  Lab 11/10/18 1706  11/11/18 0939 11/13/18 0516 11/14/18 0718 11/15/18 0500  WBC 11.7*  --  13.2* 9.7 9.1 13.0*  NEUTROABS 9.5*  --   --   --   --   --   HGB 9.3*   < > 10.7* 9.5* 9.7* 10.0*  HCT 31.2*   < > 35.9* 30.6* 32.8* 32.5*  MCV 102.6*  --  102.3* 99.4 99.7 98.8  PLT 180  --  210 190 201 204   < > = values in this interval not displayed.   Blood Culture    Component Value Date/Time   SDES BLOOD LEFT ARM 11/11/2018 1636  SDES BLOOD LEFT ARM 11/11/2018 1636   SPECREQUEST  11/11/2018 1636    BOTTLES DRAWN AEROBIC AND ANAEROBIC Blood Culture adequate volume   SPECREQUEST  11/11/2018 1636    AEROBIC BOTTLE ONLY Blood Culture results may not be optimal due to an inadequate volume of blood received in culture bottles   CULT  11/11/2018 1636    NO GROWTH 5 DAYS Performed at [facility], [place] [address], [place], [place] [address]    CULT  11/11/2018 1636    NO GROWTH 5 DAYS Performed at [facility], [place] [address], [place], [place] [address]    REPTSTATUS 11/16/2018 FINAL 11/11/2018 1636   REPTSTATUS 11/16/2018 FINAL 11/11/2018 1636    Cardiac Enzymes: No results for input(s): CKTOTAL, CKMB, CKMBINDEX, TROPONINI in the last 168 hours. CBG: Recent Labs  Lab 11/15/18 0614 11/15/18 1644 11/15/18 2128 11/16/18 0753 11/16/18 1143  GLUCAP 207* 120* 187* 201* 162*   Iron Studies: No results for input(s): IRON, TIBC, TRANSFERRIN, FERRITIN in the last 72  hours. @lablastinr3 @ Studies/Results: Dg Chest Port 1 View  Result Date: 11/16/2018 CLINICAL DATA:  Hypoxia, shortness of breath, congestion and cough. EXAM: PORTABLE CHEST 1 VIEW COMPARISON:  11/12/2018 chest x-ray and also chest CT 11/10/2018 FINDINGS: The right IJ dialysis catheter is stable. The heart is within normal limits in size and there is stable tortuosity and calcification of the thoracic aorta. Stable underlying emphysematous changes and pulmonary scarring. Persistent bibasilar atelectasis and right pleural effusion or pleural thickening. No pulmonary edema. IMPRESSION: 1. Chronic emphysematous changes and pulmonary scarring. 2. Bibasilar atelectasis and possible small right effusion. 3. No definite infiltrates or edema. Electronically Signed   By: Marijo Sanes M.D.   On: 11/16/2018 13:17   Medications:  . calcitRIOL  1.75 mcg Oral Once per day on Tue Thu Sat  . Chlorhexidine Gluconate Cloth  6 each Topical Q0600  . Chlorhexidine Gluconate Cloth  6 each Topical Q0600  . cinacalcet  60 mg Oral Once per day on Tue Thu Sat  . darbepoetin (ARANESP) injection - DIALYSIS  25 mcg Intravenous Q Tue-HD  . feeding supplement (NEPRO CARB STEADY)  237 mL Oral BID BM  . FLUoxetine  10 mg Oral Daily  . furosemide  80 mg Oral Once per day on Sun Mon Wed Fri  . heparin  5,000 Units Subcutaneous Q8H  . insulin aspart  0-9 Units Subcutaneous TID WC  . insulin detemir  7 Units Subcutaneous Daily  . ipratropium  0.5 mg Inhalation TID  . levothyroxine  112 mcg Oral Q0600  . methylPREDNISolone (SOLU-MEDROL) injection  40 mg Intravenous Q12H  . montelukast  10 mg Oral QHS  . multivitamin  1 tablet Oral QHS  . rosuvastatin  40 mg Oral Daily  . senna-docusate  1 tablet Oral QHS  . sevelamer carbonate  1,600 mg Oral TID WC

## 2018-11-16 NOTE — Progress Notes (Signed)
PROGRESS NOTE    Joan Mosley  [id number] DOB: Mar 23, 1945 DOA: 11/10/2018 PCP: Flossie Buffy, NP   Brief Narrative:   73 year old lady prior history of pulmonary hypertension, COPD with 6 L of nasal cannula oxygen at home, hyperlipidemia, diabetes mellitus, hypothyroidism, depression, end-stage renal disease on dialysis on Tuesdays Thursdays and Saturdays, recliner bound, obesity presents with generalized weakness and shortness of breath.  On arrival patient was found to be fluid overloaded and hypoxic requiring 100% of nonrebreather. Patient is currently on BiPAP with 60% FiO2 with sats in upper 90s.  Assessment & Plan:   Principal Problem:   Acute on chronic respiratory failure with hypoxia and hypercapnia (HCC) Active Problems:   Hyperkalemia   Hypothyroidism   ESRD (end stage renal disease) (HCC)   COPD with acute exacerbation (HCC)   TIA (transient ischemic attack)   HLD (hyperlipidemia)   Type II diabetes mellitus with renal manifestations (HCC)   Depression with anxiety   COPD exacerbation (HCC)   Acute on chronic respiratory failure with hypoxia and hypercapnia secondary to hypervolemia and COPD exacerbation. CTA negative for PE, COVID-19 screening test is negative. Fluid management as per HD Taper Solu-Medrol continue with incentive spirometry, chest physiotherapy, Lasix. ABG reviewed, CT chest without contrast ordered for further evaluation of persistent hypoxia   ESRD on HD IR consulted for exchange of the HD catheter  Back on the schedule on TTS. Fluid management as per hemodialysis.   History of TIA Continue with statin    Hypothyroidism Continue with Synthroid    Hyperkalemia  Mild, continue to monitor.    Deconditioning PT evaluation recommending home health PT.    Type 2 diabetes mellitus Continue with sliding scale insulin. CBG (last 3)  Recent Labs    11/15/18 2128 11/16/18 0753 11/16/18 1143  GLUCAP 187* 201* 162*      Depression with anxiety Continue with home medications.  DVT prophylaxis: Heparin Code Status: (Full code Family Communication: (None at bedside disposition Plan: Pending clinical improvement  Consultants:   Nephrology  IR  Vascular surgery  Procedures: Exchange of the HD catheter Antimicrobials: (None     Subjective: Patient reports she is tired of using BiPAP and wants to take it off.  Objective: Vitals:   11/16/18 0618 11/16/18 0751 11/16/18 0756 11/16/18 0856  BP: 124/65 (!) 128/58    Pulse: 86 81  83  Resp: (!) 25 (!) 21  18  Temp:   98.3 F (36.8 C)   TempSrc:   Axillary   SpO2: 92% 92%  95%  Weight:      Height:       No intake or output data in the 24 hours ending 11/16/18 1316 Filed Weights   11/15/18 0605 11/15/18 0730 11/15/18 1116  Weight: 103.5 kg 103.5 kg 100.5 kg    Examination:  General exam: Currently on BiPAP Respiratory system: Diminished air entry at bases and scattered wheezing heard Cardiovascular system: S1 & S2 heard, RRR. Gastrointestinal system: Abdomen is nondistended, soft and nontender. No organomegaly or masses felt. Normal bowel sounds heard. Central nervous system: Alert and oriented. No focal neurological deficits. Extremities: Symmetric 5 x 5 power. Skin: No rashes, lesions or ulcers Psychiatry: Anxious    Data Reviewed: I have personally reviewed following labs and imaging studies  CBC: Recent Labs  Lab 11/10/18 1706  11/11/18 0639 11/11/18 0939 11/13/18 0516 11/14/18 0718 11/15/18 0500  WBC 11.7*  --   --  13.2* 9.7 9.1 13.0*  NEUTROABS 9.5*  --   --   --   --   --   --  HGB 9.3*   < > 10.5* 10.7* 9.5* 9.7* 10.0*  HCT 31.2*   < > 31.0* 35.9* 30.6* 32.8* 32.5*  MCV 102.6*  --   --  102.3* 99.4 99.7 98.8  PLT 180  --   --  210 190 201 204   < > = values in this interval not displayed.   Basic Metabolic Panel: Recent Labs  Lab 11/10/18 1706  11/11/18 0639 11/11/18 0939 11/13/18 0516 11/14/18 0718  11/15/18 0802  NA 137   < > 129* 135 138 136 134*  K 5.2*   < > 6.6* 4.4 4.9 5.2* 5.1  CL 94*  --   --  95* 98 96* 92*  CO2 27  --   --  26 25 23 22   GLUCOSE 97  --   --  140* 172* 203* 220*  BUN 37*  --   --  21 32* 69* 92*  CREATININE 9.43*  --   --  5.65* 4.49* 6.58* 8.15*  CALCIUM 8.3*  --   --  8.2* 8.5* 8.7* 8.2*  PHOS  --   --   --  5.3* 7.6* 8.5* 9.2*   < > = values in this interval not displayed.   GFR: Estimated Creatinine Clearance: 6.5 mL/min (A) (by C-G formula based on SCr of 8.15 mg/dL (H)). Liver Function Tests: Recent Labs  Lab 11/11/18 0939 11/13/18 0516 11/14/18 0718 11/15/18 0802  ALBUMIN 3.4* 3.0* 3.1* 3.2*   No results for input(s): LIPASE, AMYLASE in the last 168 hours. No results for input(s): AMMONIA in the last 168 hours. Coagulation Profile: No results for input(s): INR, PROTIME in the last 168 hours. Cardiac Enzymes: No results for input(s): CKTOTAL, CKMB, CKMBINDEX, TROPONINI in the last 168 hours. BNP (last 3 results) No results for input(s): PROBNP in the last 8760 hours. HbA1C: No results for input(s): HGBA1C in the last 72 hours. CBG: Recent Labs  Lab 11/15/18 0614 11/15/18 1644 11/15/18 2128 11/16/18 0753 11/16/18 1143  GLUCAP 207* 120* 187* 201* 162*   Lipid Profile: No results for input(s): CHOL, HDL, LDLCALC, TRIG, CHOLHDL, LDLDIRECT in the last 72 hours. Thyroid Function Tests: No results for input(s): TSH, T4TOTAL, FREET4, T3FREE, THYROIDAB in the last 72 hours. Anemia Panel: No results for input(s): VITAMINB12, FOLATE, FERRITIN, TIBC, IRON, RETICCTPCT in the last 72 hours. Sepsis Labs: Recent Labs  Lab 11/11/18 0254  LATICACIDVEN 0.9    Recent Results (from the past 240 hour(s))  SARS CORONAVIRUS 2 (TAT 6-24 HRS) Nasopharyngeal Nasopharyngeal Swab     Status: None   Collection Time: 11/10/18 11:32 PM   Specimen: Nasopharyngeal Swab  Result Value Ref Range Status   SARS Coronavirus 2 NEGATIVE NEGATIVE Final     Comment: (NOTE) SARS-CoV-2 target nucleic acids are NOT DETECTED. The SARS-CoV-2 RNA is generally detectable in upper and lower respiratory specimens during the acute phase of infection. Negative results do not preclude SARS-CoV-2 infection, do not rule out co-infections with other pathogens, and should not be used as the sole basis for treatment or other patient management decisions. Negative results must be combined with clinical observations, patient history, and epidemiological information. The expected result is Negative. Fact Sheet for Patients: SugarRoll.be Fact Sheet for Healthcare Providers: https://www.woods-mathews.com/ This test is not yet approved or cleared by the [place] FDA and  has been authorized for detection and/or diagnosis of SARS-CoV-2 by FDA under an Emergency Use Authorization (EUA). This EUA will remain  in effect (meaning this test can be  used) for the duration of the COVID-19 declaration under Section 56 4(b)(1) of the Act, 21 U.S.C. section 360bbb-3(b)(1), unless the authorization is terminated or revoked sooner. Performed at [facility], [place] [address], [place], [place] [address]   Culture, blood (routine x 2)     Status: None   Collection Time: 11/11/18  4:36 PM   Specimen: BLOOD LEFT ARM  Result Value Ref Range Status   Specimen Description BLOOD LEFT ARM  Final   Special Requests   Final    BOTTLES DRAWN AEROBIC AND ANAEROBIC Blood Culture adequate volume   Culture   Final    NO GROWTH 5 DAYS Performed at [facility], [address], [place], [place] [address]    Report Status 11/16/2018 FINAL  Final  Culture, blood (routine x 2)     Status: None   Collection Time: 11/11/18  4:36 PM   Specimen: BLOOD LEFT ARM  Result Value Ref Range Status   Specimen Description BLOOD LEFT ARM  Final   Special Requests   Final    AEROBIC BOTTLE ONLY Blood Culture results may not be optimal due to an  inadequate volume of blood received in culture bottles   Culture   Final    NO GROWTH 5 DAYS Performed at [facility], [place] [address] [place], [place] [address]    Report Status 11/16/2018 FINAL  Final         Radiology Studies: No results found.      Scheduled Meds: . calcitRIOL  1.75 mcg Oral Once per day on Tue Thu Sat  . Chlorhexidine Gluconate Cloth  6 each Topical Q0600  . Chlorhexidine Gluconate Cloth  6 each Topical Q0600  . cinacalcet  60 mg Oral Once per day on Tue Thu Sat  . darbepoetin (ARANESP) injection - DIALYSIS  25 mcg Intravenous Q Tue-HD  . feeding supplement (NEPRO CARB STEADY)  237 mL Oral BID BM  . FLUoxetine  10 mg Oral Daily  . furosemide  80 mg Oral Once per day on Sun Mon Wed Fri  . heparin  5,000 Units Subcutaneous Q8H  . insulin aspart  0-9 Units Subcutaneous TID WC  . insulin detemir  7 Units Subcutaneous Daily  . ipratropium  0.5 mg Inhalation TID  . levothyroxine  112 mcg Oral Q0600  . methylPREDNISolone (SOLU-MEDROL) injection  40 mg Intravenous Q12H  . montelukast  10 mg Oral QHS  . multivitamin  1 tablet Oral QHS  . rosuvastatin  40 mg Oral Daily  . senna-docusate  1 tablet Oral QHS  . sevelamer carbonate  1,600 mg Oral TID WC   Continuous Infusions:   LOS: 5 days       Hosie Poisson, MD [facility] Pager [phone]  If 7PM-7AM, please contact night-coverage [email or web address] Password [email or web address] 11/16/2018, 1:16 PM

## 2018-11-16 NOTE — Progress Notes (Signed)
Pt was taken off Bipap for a trial run around 0815. Put pt on 15L HFNC. Pt sats were in the 70's-80's majority of the time. Sats did end up dropping into the 60's with noticeable labored breathing. Pt was placed back on Bipap and I explained to the patient that she could not tolerate being off at this time. I told the patient we could trial again later. Pt agreeable at this time. Will continue to monitor.   Shanon Rosser, RN

## 2018-11-16 NOTE — Progress Notes (Signed)
OT Cancellation Note  Patient Details Name: Joan Mosley MRN: [id number] DOB: [birth year]   Cancelled Treatment:    Reason Eval/Treat Not Completed: Patient at procedure or test/ unavailable;Other (comment) Pt going for CT scan at time of OT arrrival; will check as time allows.  [place], [place] [place] 11/16/2018, 2:29 PM

## 2018-11-17 LAB — RENAL FUNCTION PANEL
Albumin: 2.9 g/dL — ABNORMAL LOW (ref 3.5–5.0)
Anion gap: 16 — ABNORMAL HIGH (ref 5–15)
BUN: 63 mg/dL — ABNORMAL HIGH (ref 8–23)
CO2: 24 mmol/L (ref 22–32)
Calcium: 8.8 mg/dL — ABNORMAL LOW (ref 8.9–10.3)
Chloride: 97 mmol/L — ABNORMAL LOW (ref 98–111)
Creatinine, Ser: 7.2 mg/dL — ABNORMAL HIGH (ref 0.44–1.00)
GFR calc Af Amer: 6 mL/min — ABNORMAL LOW (ref >60)
GFR calc non Af Amer: 5 mL/min — ABNORMAL LOW (ref >60)
Glucose, Bld: 247 mg/dL — ABNORMAL HIGH (ref 70–99)
Phosphorus: 8.7 mg/dL — ABNORMAL HIGH (ref 2.5–4.6)
Potassium: 5.1 mmol/L (ref 3.5–5.1)
Sodium: 137 mmol/L (ref 135–145)

## 2018-11-17 LAB — CBC
HCT: 35.7 % — ABNORMAL LOW (ref 36.0–46.0)
Hemoglobin: 10.5 g/dL — ABNORMAL LOW (ref 12.0–15.0)
MCH: 30.3 pg (ref 26.0–34.0)
MCHC: 29.4 g/dL — ABNORMAL LOW (ref 30.0–36.0)
MCV: 103.2 fL — ABNORMAL HIGH (ref 80.0–100.0)
Platelets: 146 10*3/uL — ABNORMAL LOW (ref 150–400)
RBC: 3.46 MIL/uL — ABNORMAL LOW (ref 3.87–5.11)
RDW: 17.2 % — ABNORMAL HIGH (ref 11.5–15.5)
WBC: 15 10*3/uL — ABNORMAL HIGH (ref 4.0–10.5)
nRBC: 0.2 % (ref 0.0–0.2)

## 2018-11-17 LAB — GLUCOSE, CAPILLARY
Glucose-Capillary: 231 mg/dL — ABNORMAL HIGH (ref 70–99)
Glucose-Capillary: 137 mg/dL — ABNORMAL HIGH (ref 70–99)
Glucose-Capillary: 169 mg/dL — ABNORMAL HIGH (ref 70–99)
Glucose-Capillary: 272 mg/dL — ABNORMAL HIGH (ref 70–99)

## 2018-11-17 MED ORDER — DEXTROSE 5 % IV SOLN
0.5000 g | Freq: Two times a day (BID) | INTRAVENOUS | Status: DC
  Administered 2018-11-18 – 2018-11-20 (×4): 0.5 g via INTRAVENOUS
  Filled 2018-11-17 (×9): qty 0.5

## 2018-11-17 MED ORDER — CINACALCET HCL 30 MG PO TABS
ORAL_TABLET | ORAL | Status: AC
  Filled 2018-11-17: qty 2

## 2018-11-17 MED ORDER — SODIUM CHLORIDE 0.9 % IV SOLN
1.0000 g | Freq: Once | INTRAVENOUS | Status: AC
  Administered 2018-11-17: 14:00:00 1 g via INTRAVENOUS
  Filled 2018-11-17: qty 1

## 2018-11-17 MED ORDER — CALCITRIOL 0.25 MCG PO CAPS
ORAL_CAPSULE | ORAL | Status: AC
  Filled 2018-11-17: qty 1

## 2018-11-17 MED ORDER — ACETAMINOPHEN 325 MG PO TABS
ORAL_TABLET | ORAL | Status: AC
  Filled 2018-11-17: qty 2

## 2018-11-17 MED ORDER — VANCOMYCIN HCL IN DEXTROSE 1-5 GM/200ML-% IV SOLN
1000.0000 mg | INTRAVENOUS | Status: DC
  Administered 2018-11-19: 11:00:00 1000 mg via INTRAVENOUS
  Filled 2018-11-17: qty 200

## 2018-11-17 MED ORDER — CALCITRIOL 0.5 MCG PO CAPS
ORAL_CAPSULE | ORAL | Status: AC
  Filled 2018-11-17: qty 3

## 2018-11-17 MED ORDER — MIDODRINE HCL 5 MG PO TABS
ORAL_TABLET | ORAL | Status: AC
  Filled 2018-11-17: qty 2

## 2018-11-17 MED ORDER — VANCOMYCIN HCL 10 G IV SOLR
2000.0000 mg | INTRAVENOUS | Status: AC
  Administered 2018-11-17: 2000 mg via INTRAVENOUS
  Filled 2018-11-17: qty 2000

## 2018-11-17 MED ORDER — HEPARIN SODIUM (PORCINE) 1000 UNIT/ML IJ SOLN
INTRAMUSCULAR | Status: AC
  Administered 2018-11-17: 3800 [IU] via INTRAVENOUS
  Filled 2018-11-17: qty 4

## 2018-11-17 NOTE — Progress Notes (Signed)
PT Cancellation Note  Patient Details Name: Joan Mosley MRN: [id number] DOB: May 31, 1945   Cancelled Treatment:    Reason Eval/Treat Not Completed: Patient at procedure or test/unavailable. Pt currently in HD. Will try again later.   Shary Decamp [facility] 11/17/2018, 8:31 AM [place] Pager [phone] Office [phone]

## 2018-11-17 NOTE — Progress Notes (Signed)
[facility] Progress Note   Subjective: no new c/o's, seen on HD. 2.4 L off on HD this am.   Objective Vitals:   11/17/18 1030 11/17/18 1044 11/17/18 1100 11/17/18 1115  BP: (!) 82/54 121/68 130/67 (!) 147/84  Pulse: 84 63 66 62  Resp: 18 (!) 22 (!) 21 19  Temp:    (!) 97.3 F (36.3 C)  TempSrc:    Oral  SpO2: 99% 99% 99% 97%  Weight:    96.4 kg  Height:       Physical Exam General: Chronically ill appearing older female, on HD, nasal O2 Heart: S1,S2 RRR SR on monitor. Prominent neck veins, slight JVD Lungs: slight exp wheezing, no rales Abdomen: Obese, NT Extremities: woody appearance BLE, trace -1 + ankle edema. Dialysis Access: RIJ TDC Drsg intact   [facility]    99kg    2K/2Ca     P4   Heparin - none    TDC R chest (now replaced 11/14/18, IR) - Calcitriol 1.45mcg PO q HD - Sensipar 60mg  PO q HD - Venofer 100 x 5 ordered - Mircera 100 q 2 weeks  Assessment/Plan: 1. Dyspnea: COPD/ OHS, acute on chronic resp failure, on 6L [place] at home. Due to combination of COPD/ OHS flare and vol overload. Was 7kg up on admission, now is at dry wt after HDx 3. CXR's w/o edema. Still getting fluid off w/ HD, 2.5 L today, will cont to lower dry wt as tol, not sure if this is helping her resp issues or not.  On IV steroids/ nebs as well.  2. 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  Appreciate IR assist.  3. ESRD: Usual TTS sched - HD today, lower edw as tol 4. Hypertension/volume- as above. Gets midodrine before HD and midrun 5. Anemia:Hgb 9.5. Give low dose Aranesp weekly 6. Metabolic bone disease: Ca 8.5 /Phos 7.6 Continue home binders/VDRA/sensipar. 7. Hypothyroidism: Continue synthroid.  7. T2DM   8. Nutrition- Renal/Carb mod diet ordered. Albumin 3.0 Renal vits, nepro      Kelly Splinter  MD 11/16/2018, 1:48 PM  Additional Objective Labs: Basic Metabolic Panel: Recent Labs  Lab  11/14/18 0718 11/15/18 0802 11/17/18 0728  NA 136 134* 137  K 5.2* 5.1 5.1  CL 96* 92* 97*  CO2 23 22 24   GLUCOSE 203* 220* 247*  BUN 69* 92* 63*  CREATININE 6.58* 8.15* 7.20*  CALCIUM 8.7* 8.2* 8.8*  PHOS 8.5* 9.2* 8.7*   Liver Function Tests: Recent Labs  Lab 11/14/18 0718 11/15/18 0802 11/17/18 0728  ALBUMIN 3.1* 3.2* 2.9*   No results for input(s): LIPASE, AMYLASE in the last 168 hours. CBC: Recent Labs  Lab 11/10/18 1706  11/11/18 0939 11/13/18 0516 11/14/18 0718 11/15/18 0500 11/17/18 0728  WBC 11.7*  --  13.2* 9.7 9.1 13.0* 15.0*  NEUTROABS 9.5*  --   --   --   --   --   --   HGB 9.3*   < > 10.7* 9.5* 9.7* 10.0* 10.5*  HCT 31.2*   < > 35.9* 30.6* 32.8* 32.5* 35.7*  MCV 102.6*  --  102.3* 99.4 99.7 98.8 103.2*  PLT 180  --  210 190 201 204 146*   < > = values in this interval not displayed.   Blood Culture    Component Value Date/Time   SDES BLOOD LEFT ARM 11/11/2018 1636   SDES BLOOD LEFT ARM  11/11/2018 1636   SPECREQUEST  11/11/2018 1636    BOTTLES DRAWN AEROBIC AND ANAEROBIC Blood Culture adequate volume   SPECREQUEST  11/11/2018 1636    AEROBIC BOTTLE ONLY Blood Culture results may not be optimal due to an inadequate volume of blood received in culture bottles   CULT  11/11/2018 1636    NO GROWTH 5 DAYS Performed at [facility], [place] [address], [place], [place] [address]    CULT  11/11/2018 1636    NO GROWTH 5 DAYS Performed at [facility], [place] [address], [place], [place] [address]    REPTSTATUS 11/16/2018 FINAL 11/11/2018 1636   REPTSTATUS 11/16/2018 FINAL 11/11/2018 1636    Cardiac Enzymes: No results for input(s): CKTOTAL, CKMB, CKMBINDEX, TROPONINI in the last 168 hours. CBG: Recent Labs  Lab 11/16/18 0753 11/16/18 1143 11/16/18 1629 11/16/18 2135 11/17/18 0628  GLUCAP 201* 162* 222* 182* 231*   Iron Studies: No results for input(s): IRON, TIBC, TRANSFERRIN, FERRITIN in the last 72  hours. @lablastinr3 @ Studies/Results: Ct Chest Wo Contrast  Result Date: 11/16/2018 CLINICAL DATA:  Chronic dyspnea, COPD. EXAM: CT CHEST WITHOUT CONTRAST TECHNIQUE: Multidetector CT imaging of the chest was performed following the standard protocol without IV contrast. COMPARISON:  November 10, 2018. FINDINGS: Cardiovascular: And atherosclerosis of thoracic aorta is noted without aneurysm formation. Normal cardiac size. No pericardial effusion. Coronary artery calcifications are noted. Right internal jugular catheter is noted with tip in right atrium. Enlarged pulmonary arteries are noted suggesting pulmonary artery hypertension. Mediastinum/Nodes: No enlarged mediastinal or axillary lymph nodes. Thyroid gland, trachea, and esophagus demonstrate no significant findings. Lungs/Pleura: No pneumothorax or significant pleural effusion is noted. Large right lower lobe airspace opacity is noted with air bronchograms concerning for pneumonia. Stable scarring is noted in right upper lobe. Mild emphysematous disease is again noted. Upper Abdomen: No acute abnormality. Musculoskeletal: No chest wall mass or suspicious bone lesions identified. IMPRESSION: Large right lower lobe airspace opacity is noted with air bronchograms concerning for pneumonia. Stable enlarged pulmonary arteries are noted suggesting pulmonary artery hypertension. Coronary artery calcifications are noted. Aortic Atherosclerosis (ICD10-I70.0) and Emphysema (ICD10-J43.9). Electronically Signed   By: Marijo Conception M.D.   On: 11/16/2018 15:53   Dg Chest Port 1 View  Result Date: 11/16/2018 CLINICAL DATA:  Hypoxia, shortness of breath, congestion and cough. EXAM: PORTABLE CHEST 1 VIEW COMPARISON:  11/12/2018 chest x-ray and also chest CT 11/10/2018 FINDINGS: The right IJ dialysis catheter is stable. The heart is within normal limits in size and there is stable tortuosity and calcification of the thoracic aorta. Stable underlying emphysematous  changes and pulmonary scarring. Persistent bibasilar atelectasis and right pleural effusion or pleural thickening. No pulmonary edema. IMPRESSION: 1. Chronic emphysematous changes and pulmonary scarring. 2. Bibasilar atelectasis and possible small right effusion. 3. No definite infiltrates or edema. Electronically Signed   By: Marijo Sanes M.D.   On: 11/16/2018 13:17   Medications: . [START ON 11/18/2018] aztreonam    . aztreonam    . [COMPLETED] vancomycin 2,000 mg (11/17/18 0935)  . [START ON 11/19/2018] vancomycin     . acetaminophen      . calcitRIOL  1.75 mcg Oral Once per day on Tue Thu Sat  . Chlorhexidine Gluconate Cloth  6 each Topical Q0600  . cinacalcet  60 mg Oral Once per day on Tue Thu Sat  . darbepoetin (ARANESP) injection - DIALYSIS  25 mcg Intravenous Q Tue-HD  . feeding supplement (NEPRO CARB STEADY)  237  mL Oral BID BM  . FLUoxetine  10 mg Oral Daily  . furosemide  80 mg Oral Once per day on Sun Mon Wed Fri  . heparin  5,000 Units Subcutaneous Q8H  . insulin aspart  0-9 Units Subcutaneous TID WC  . insulin detemir  7 Units Subcutaneous Daily  . ipratropium  0.5 mg Inhalation TID  . levothyroxine  112 mcg Oral Q0600  . methylPREDNISolone (SOLU-MEDROL) injection  40 mg Intravenous Q12H  . montelukast  10 mg Oral QHS  . multivitamin  1 tablet Oral QHS  . rosuvastatin  40 mg Oral Daily  . senna-docusate  1 tablet Oral QHS  . sevelamer carbonate  1,600 mg Oral TID WC

## 2018-11-17 NOTE — Progress Notes (Signed)
Pharmacy Antibiotic Note  Joan Mosley is a 73 y.o. female admitted on 11/10/2018 with SOB and now with concern for PNA based on most recent CT findings. Pharmacy has been consulted for Vancomycin + Azactam dosing.  The patient is ESRD and currently receiving HD this AM.  Plan: - Vancomycin 2g IV x 1 s/p HD today - Start Vancomycin 1g/HD-TTS on 10/24 - Azactam 1g IV x 1 s/p HD today - Start Azactam 500 mg IV every 12 hours on 10/23 - Will continue to follow HD schedule/duration, culture results, LOT, and antibiotic de-escalation plans   Height: 5\' 1"  (154.9 cm) Weight: 219 lb 2.2 oz (99.4 kg) IBW/kg (Calculated) : 47.8  Temp (24hrs), Avg:97.7 F (36.5 C), Min:97.4 F (36.3 C), Max:98.2 F (36.8 C)  Recent Labs  Lab 11/11/18 0254 11/11/18 0939 11/13/18 0516 11/14/18 0718 11/15/18 0500 11/15/18 0802 11/17/18 0728  WBC  --  13.2* 9.7 9.1 13.0*  --  15.0*  CREATININE  --  5.65* 4.49* 6.58*  --  8.15* 7.20*  LATICACIDVEN 0.9  --   --   --   --   --   --     Estimated Creatinine Clearance: 7.3 mL/min (A) (by C-G formula based on SCr of 7.2 mg/dL (H)).    Allergies  Allergen Reactions  . Avelox [Moxifloxacin Hcl In Nacl] Other (See Comments)    Unknown reaction  . Codeine Anaphylaxis  . Penicillins Rash    Did it involve swelling of the face/tongue/throat, SOB, or low BP? Unknown Did it involve sudden or severe rash/hives, skin peeling, or any reaction on the inside of your mouth or nose? Yes Did you need to seek medical attention at a hospital or doctor's office? Unknown When did it last happen?unknown If all above answers are "NO", may proceed with cephalosporin use.   . Sulfa Antibiotics Other (See Comments)    Unknown reaction  . Dextromethorphan-Guaifenesin Other (See Comments)    Reported by [facility] - unknown reaction  . Shellfish Allergy Hives, Itching and Rash    Antimicrobials this admission: Vanc 10/22 >> Cefepime 10/22 >>  Dose adjustments  this admission:  Microbiology results: 10/15 COVID >> neg 10/16 BCx >> NGf  Thank you for allowing pharmacy to be a part of this patient's care.  Alycia Rossetti, PharmD, BCPS Clinical Pharmacist Clinical phone for 11/17/2018: [phone] 11/17/2018 9:50 AM   **Pharmacist phone directory can now be found on [email or web address] ([email or web address]).  Listed under [place].

## 2018-11-17 NOTE — Progress Notes (Signed)
PROGRESS NOTE    Joan Mosley  [id number] DOB: [birth year] DOA: 11/10/2018 PCP: Flossie Buffy, NP   Brief Narrative:   73 year old lady prior history of pulmonary hypertension, COPD with 6 L of nasal cannula oxygen at home, hyperlipidemia, diabetes mellitus, hypothyroidism, depression, end-stage renal disease on dialysis on Tuesdays Thursdays and Saturdays, recliner bound, obesity presents with generalized weakness and shortness of breath.  On arrival patient was found to be fluid overloaded and hypoxic requiring 100% of nonrebreather.   Interim History:  Pt refused bIPAP overnight and has been on [place] oxygen high flow at 15 lit with sats greater than 92%.   Assessment & Plan:   Principal Problem:   Acute on chronic respiratory failure with hypoxia and hypercapnia (HCC) Active Problems:   Hyperkalemia   Hypothyroidism   ESRD (end stage renal disease) (HCC)   COPD with acute exacerbation (HCC)   TIA (transient ischemic attack)   HLD (hyperlipidemia)   Type II diabetes mellitus with renal manifestations (HCC)   Depression with anxiety   COPD exacerbation (HCC)   Acute on chronic respiratory failure with hypoxia and hypercapnia secondary to hypervolemia and COPD exacerbation. CTA negative for PE, COVID-19 screening test is negative. Fluid management as per HD Taper Solu-Medrol as appropriate continue with incentive spirometry, chest physiotherapy, and lasix.  CT chest without contrast showed right mid and lower lobe pneumonia. She was started on IV vancomycin and IV Azactam.  Recommend to BIPAP overnight.    ESRD on HD IR consulted for exchange of the HD catheter  Back on the schedule on TTS. Fluid management as per hemodialysis.   History of TIA Continue with statin    Hypothyroidism Continue with Synthroid    Hyperkalemia  Resolved with HD.    Deconditioning PT evaluation recommending home health PT.    Type 2 diabetes mellitus Continue with  sliding scale insulin. CBG (last 3)  Recent Labs    11/17/18 0628 11/17/18 1315 11/17/18 1632  GLUCAP 231* 137* 169*   No changes in medications.    Depression with anxiety Continue with home medications.  DVT prophylaxis: Heparin Code Status: (Full code Family Communication: (None at bedside  disposition Plan: Pending clinical improvement, possinble d/c when weaned to home oxygen.   Consultants:   Nephrology  IR  Vascular surgery  Procedures: Exchange of the HD catheter Antimicrobials: IV vancomycin and IV cefepime.   Subjective: Pt reports feeling better,but not back to baseline.  No chest pain ,nausea or vomiting.   Objective: Vitals:   11/17/18 1100 11/17/18 1115 11/17/18 1314 11/17/18 1431  BP: 130/67 (!) 147/84 125/69   Pulse: 66 62 90 (!) 54  Resp: (!) 21 19 (!) 23 19  Temp:  (!) 97.3 F (36.3 C) 97.7 F (36.5 C)   TempSrc:  Oral Oral   SpO2: 99% 97% 92% 92%  Weight:  96.4 kg    Height:        Intake/Output Summary (Last 24 hours) at 11/17/2018 1919 Last data filed at 11/17/2018 1800 Gross per 24 hour  Intake 0 ml  Output 2451 ml  Net -2451 ml   Filed Weights   11/17/18 0505 11/17/18 0700 11/17/18 1115  Weight: 100.3 kg 99.4 kg 96.4 kg    Examination:  General exam: not in distress. On 15 lit of  oxygen.  Respiratory system:diminished at bases, scattered wheezing heard.  Cardiovascular system: s1s2, RRR.  Gastrointestinal system: abd is soft, NT ND BS+ Central nervous system: alert and oriented.  Extremities:  no cyanosis or clubbing.  Skin: no rashes.  Psychiatry: Anxious    Data Reviewed: I have personally reviewed following labs and imaging studies  CBC: Recent Labs  Lab 11/11/18 0939 11/13/18 0516 11/14/18 0718 11/15/18 0500 11/17/18 0728  WBC 13.2* 9.7 9.1 13.0* 15.0*  HGB 10.7* 9.5* 9.7* 10.0* 10.5*  HCT 35.9* 30.6* 32.8* 32.5* 35.7*  MCV 102.3* 99.4 99.7 98.8 103.2*  PLT 210 190 201 204 [id number]*   Basic Metabolic  Panel: Recent Labs  Lab 11/11/18 0939 11/13/18 0516 11/14/18 0718 11/15/18 0802 11/17/18 0728  NA 135 138 136 134* 137  K 4.4 4.9 5.2* 5.1 5.1  CL 95* 98 96* 92* 97*  CO2 26 25 23 22 24   GLUCOSE 140* 172* 203* 220* 247*  BUN 21 32* 69* 92* 63*  CREATININE 5.65* 4.49* 6.58* 8.15* 7.20*  CALCIUM 8.2* 8.5* 8.7* 8.2* 8.8*  PHOS 5.3* 7.6* 8.5* 9.2* 8.7*   GFR: Estimated Creatinine Clearance: 7.2 mL/min (A) (by C-G formula based on SCr of 7.2 mg/dL (H)). Liver Function Tests: Recent Labs  Lab 11/11/18 0939 11/13/18 0516 11/14/18 0718 11/15/18 0802 11/17/18 0728  ALBUMIN 3.4* 3.0* 3.1* 3.2* 2.9*   No results for input(s): LIPASE, AMYLASE in the last 168 hours. No results for input(s): AMMONIA in the last 168 hours. Coagulation Profile: No results for input(s): INR, PROTIME in the last 168 hours. Cardiac Enzymes: No results for input(s): CKTOTAL, CKMB, CKMBINDEX, TROPONINI in the last 168 hours. BNP (last 3 results) No results for input(s): PROBNP in the last 8760 hours. HbA1C: No results for input(s): HGBA1C in the last 72 hours. CBG: Recent Labs  Lab 11/16/18 1629 11/16/18 2135 11/17/18 0628 11/17/18 1315 11/17/18 1632  GLUCAP 222* 182* 231* 137* 169*   Lipid Profile: No results for input(s): CHOL, HDL, LDLCALC, TRIG, CHOLHDL, LDLDIRECT in the last 72 hours. Thyroid Function Tests: No results for input(s): TSH, T4TOTAL, FREET4, T3FREE, THYROIDAB in the last 72 hours. Anemia Panel: No results for input(s): VITAMINB12, FOLATE, FERRITIN, TIBC, IRON, RETICCTPCT in the last 72 hours. Sepsis Labs: Recent Labs  Lab 11/11/18 0254  LATICACIDVEN 0.9    Recent Results (from the past 240 hour(s))  SARS CORONAVIRUS 2 (TAT 6-24 HRS) Nasopharyngeal Nasopharyngeal Swab     Status: None   Collection Time: 11/10/18 11:32 PM   Specimen: Nasopharyngeal Swab  Result Value Ref Range Status   SARS Coronavirus 2 NEGATIVE NEGATIVE Final    Comment: (NOTE) SARS-CoV-2 target  nucleic acids are NOT DETECTED. The SARS-CoV-2 RNA is generally detectable in upper and lower respiratory specimens during the acute phase of infection. Negative results do not preclude SARS-CoV-2 infection, do not rule out co-infections with other pathogens, and should not be used as the sole basis for treatment or other patient management decisions. Negative results must be combined with clinical observations, patient history, and epidemiological information. The expected result is Negative. Fact Sheet for Patients: SugarRoll.be Fact Sheet for Healthcare Providers: https://www.woods-mathews.com/ This test is not yet approved or cleared by the [place] FDA and  has been authorized for detection and/or diagnosis of SARS-CoV-2 by FDA under an Emergency Use Authorization (EUA). This EUA will remain  in effect (meaning this test can be used) for the duration of the COVID-19 declaration under Section 56 4(b)(1) of the Act, 21 U.S.C. section 360bbb-3(b)(1), unless the authorization is terminated or revoked sooner. Performed at [facility], [place] [address], [place], [place] [address]   Culture, blood (routine x 2)  Status: None   Collection Time: 11/11/18  4:36 PM   Specimen: BLOOD LEFT ARM  Result Value Ref Range Status   Specimen Description BLOOD LEFT ARM  Final   Special Requests   Final    BOTTLES DRAWN AEROBIC AND ANAEROBIC Blood Culture adequate volume   Culture   Final    NO GROWTH 5 DAYS Performed at [facility], [address], [place], [place] [address]    Report Status 11/16/2018 FINAL  Final  Culture, blood (routine x 2)     Status: None   Collection Time: 11/11/18  4:36 PM   Specimen: BLOOD LEFT ARM  Result Value Ref Range Status   Specimen Description BLOOD LEFT ARM  Final   Special Requests   Final    AEROBIC BOTTLE ONLY Blood Culture results may not be optimal due to an inadequate volume of blood  received in culture bottles   Culture   Final    NO GROWTH 5 DAYS Performed at [facility], [place] [address] [place], [place] [address]    Report Status 11/16/2018 FINAL  Final         Radiology Studies: Ct Chest Wo Contrast  Result Date: 11/16/2018 CLINICAL DATA:  Chronic dyspnea, COPD. EXAM: CT CHEST WITHOUT CONTRAST TECHNIQUE: Multidetector CT imaging of the chest was performed following the standard protocol without IV contrast. COMPARISON:  November 10, 2018. FINDINGS: Cardiovascular: And atherosclerosis of thoracic aorta is noted without aneurysm formation. Normal cardiac size. No pericardial effusion. Coronary artery calcifications are noted. Right internal jugular catheter is noted with tip in right atrium. Enlarged pulmonary arteries are noted suggesting pulmonary artery hypertension. Mediastinum/Nodes: No enlarged mediastinal or axillary lymph nodes. Thyroid gland, trachea, and esophagus demonstrate no significant findings. Lungs/Pleura: No pneumothorax or significant pleural effusion is noted. Large right lower lobe airspace opacity is noted with air bronchograms concerning for pneumonia. Stable scarring is noted in right upper lobe. Mild emphysematous disease is again noted. Upper Abdomen: No acute abnormality. Musculoskeletal: No chest wall mass or suspicious bone lesions identified. IMPRESSION: Large right lower lobe airspace opacity is noted with air bronchograms concerning for pneumonia. Stable enlarged pulmonary arteries are noted suggesting pulmonary artery hypertension. Coronary artery calcifications are noted. Aortic Atherosclerosis (ICD10-I70.0) and Emphysema (ICD10-J43.9). Electronically Signed   By: Marijo Conception M.D.   On: 11/16/2018 15:53   Dg Chest Port 1 View  Result Date: 11/16/2018 CLINICAL DATA:  Hypoxia, shortness of breath, congestion and cough. EXAM: PORTABLE CHEST 1 VIEW COMPARISON:  11/12/2018 chest x-ray and also chest CT 11/10/2018 FINDINGS: The right  IJ dialysis catheter is stable. The heart is within normal limits in size and there is stable tortuosity and calcification of the thoracic aorta. Stable underlying emphysematous changes and pulmonary scarring. Persistent bibasilar atelectasis and right pleural effusion or pleural thickening. No pulmonary edema. IMPRESSION: 1. Chronic emphysematous changes and pulmonary scarring. 2. Bibasilar atelectasis and possible small right effusion. 3. No definite infiltrates or edema. Electronically Signed   By: Marijo Sanes M.D.   On: 11/16/2018 13:17        Scheduled Meds: . calcitRIOL  1.75 mcg Oral Once per day on Tue Thu Sat  . Chlorhexidine Gluconate Cloth  6 each Topical Q0600  . cinacalcet  60 mg Oral Once per day on Tue Thu Sat  . darbepoetin (ARANESP) injection - DIALYSIS  25 mcg Intravenous Q Tue-HD  . feeding supplement (NEPRO CARB STEADY)  237 mL Oral BID BM  .  FLUoxetine  10 mg Oral Daily  . furosemide  80 mg Oral Once per day on Sun Mon Wed Fri  . heparin  5,000 Units Subcutaneous Q8H  . insulin aspart  0-9 Units Subcutaneous TID WC  . insulin detemir  7 Units Subcutaneous Daily  . ipratropium  0.5 mg Inhalation TID  . levothyroxine  112 mcg Oral Q0600  . methylPREDNISolone (SOLU-MEDROL) injection  40 mg Intravenous Q12H  . montelukast  10 mg Oral QHS  . multivitamin  1 tablet Oral QHS  . rosuvastatin  40 mg Oral Daily  . senna-docusate  1 tablet Oral QHS  . sevelamer carbonate  1,600 mg Oral TID WC   Continuous Infusions: . [START ON 11/18/2018] aztreonam    . [START ON 11/19/2018] vancomycin       LOS: 6 days       Hosie Poisson, MD [facility] Pager [phone]  If 7PM-7AM, please contact night-coverage [email or web address] Password [facility] 11/17/2018, 7:19 PM

## 2018-11-17 NOTE — Progress Notes (Signed)
PT Cancellation Note  Patient Details Name: Joan Mosley MRN: [id number] DOB: [birth year]   Cancelled Treatment:    Reason Eval/Treat Not Completed: Other (comment). Pt reports she is too tired after HD this AM. Will continue attempts. Pt will need continued assist from family at home.     Shary Decamp [place] 11/17/2018, 2:20 PM [place] Pager [phone] Office [phone]

## 2018-11-18 ENCOUNTER — Inpatient Hospital Stay (HOSPITAL_COMMUNITY): Payer: Medicare Other

## 2018-11-18 DIAGNOSIS — R0902 Hypoxemia: Secondary | ICD-10-CM

## 2018-11-18 DIAGNOSIS — Z515 Encounter for palliative care: Secondary | ICD-10-CM

## 2018-11-18 DIAGNOSIS — Z7189 Other specified counseling: Secondary | ICD-10-CM

## 2018-11-18 LAB — GLUCOSE, CAPILLARY
Glucose-Capillary: 201 mg/dL — ABNORMAL HIGH (ref 70–99)
Glucose-Capillary: 239 mg/dL — ABNORMAL HIGH (ref 70–99)
Glucose-Capillary: 202 mg/dL — ABNORMAL HIGH (ref 70–99)
Glucose-Capillary: 138 mg/dL — ABNORMAL HIGH (ref 70–99)

## 2018-11-18 LAB — HEMOGLOBIN A1C
Hgb A1c MFr Bld: 5.6 % (ref 4.8–5.6)
Mean Plasma Glucose: 114.02 mg/dL

## 2018-11-18 LAB — STREP PNEUMONIAE URINARY ANTIGEN: Strep Pneumo Urinary Antigen: NEGATIVE

## 2018-11-18 MED ORDER — INSULIN ASPART 100 UNIT/ML ~~LOC~~ SOLN
0.0000 [IU] | Freq: Three times a day (TID) | SUBCUTANEOUS | Status: DC
  Administered 2018-11-18 (×2): 5 [IU] via SUBCUTANEOUS
  Administered 2018-11-19: 8 [IU] via SUBCUTANEOUS
  Administered 2018-11-20: 10:00:00 3 [IU] via SUBCUTANEOUS

## 2018-11-18 MED ORDER — INSULIN ASPART 100 UNIT/ML ~~LOC~~ SOLN
0.0000 [IU] | Freq: Every day | SUBCUTANEOUS | Status: DC
  Administered 2018-11-19: 22:00:00 3 [IU] via SUBCUTANEOUS

## 2018-11-18 MED ORDER — INSULIN DETEMIR 100 UNIT/ML ~~LOC~~ SOLN
10.0000 [IU] | Freq: Every day | SUBCUTANEOUS | Status: DC
  Filled 2018-11-18 (×2): qty 0.1

## 2018-11-18 MED ORDER — LORAZEPAM 2 MG/ML IJ SOLN
0.5000 mg | Freq: Four times a day (QID) | INTRAMUSCULAR | Status: DC
  Administered 2018-11-18 – 2018-11-20 (×6): 0.5 mg via INTRAVENOUS
  Filled 2018-11-18 (×7): qty 1

## 2018-11-18 MED ORDER — MORPHINE SULFATE (PF) 2 MG/ML IV SOLN: 1.0000 mg | Freq: Once | INTRAVENOUS | Status: DC

## 2018-11-18 MED ORDER — LEVALBUTEROL HCL 0.63 MG/3ML IN NEBU
0.6300 mg | INHALATION_SOLUTION | Freq: Three times a day (TID) | RESPIRATORY_TRACT | Status: DC
  Administered 2018-11-18 – 2018-11-20 (×7): 0.63 mg via RESPIRATORY_TRACT
  Filled 2018-11-18 (×6): qty 3

## 2018-11-18 MED ORDER — MORPHINE SULFATE (PF) 2 MG/ML IV SOLN: 2.0000 mg | INTRAVENOUS | Status: DC | PRN

## 2018-11-18 MED ORDER — MORPHINE SULFATE (PF) 2 MG/ML IV SOLN: 2.0000 mg | Freq: Three times a day (TID) | INTRAVENOUS | Status: DC

## 2018-11-18 MED ORDER — CHLORHEXIDINE GLUCONATE CLOTH 2 % EX PADS
6.0000 | MEDICATED_PAD | Freq: Every day | CUTANEOUS | Status: DC
  Administered 2018-11-19 – 2018-11-21 (×3): 6 via TOPICAL

## 2018-11-18 MED ORDER — HYDROMORPHONE HCL 1 MG/ML IJ SOLN: 0.2500 mg | INTRAMUSCULAR | Status: DC | PRN

## 2018-11-18 MED ORDER — METHYLPREDNISOLONE SODIUM SUCC 125 MG IJ SOLR
60.0000 mg | Freq: Every day | INTRAMUSCULAR | Status: DC
  Administered 2018-11-19: 14:00:00 60 mg via INTRAVENOUS
  Filled 2018-11-18: qty 2

## 2018-11-18 MED ORDER — LORAZEPAM 2 MG/ML IJ SOLN
1.0000 mg | INTRAMUSCULAR | Status: DC | PRN
  Administered 2018-11-18: 1 mg via INTRAVENOUS
  Filled 2018-11-18: qty 1

## 2018-11-18 MED ORDER — HYDROMORPHONE HCL 1 MG/ML IJ SOLN
0.2500 mg | Freq: Three times a day (TID) | INTRAMUSCULAR | Status: DC
  Administered 2018-11-18 – 2018-11-20 (×4): 0.25 mg via INTRAVENOUS
  Filled 2018-11-18 (×4): qty 1

## 2018-11-18 NOTE — Progress Notes (Signed)
Chaplain visited with the patient and family and offered a prayer of healing and strength.  The chaplain will pass on the consult to on-call chaplain for follow up.  Brion Aliment Chaplain Resident For questions concerning this note please contact me by pager [phone]

## 2018-11-18 NOTE — Consult Note (Addendum)
NAME:  Joan Mosley, MRN:  [id number], DOB:  March 12, 1945, LOS: 7 ADMISSION DATE:  11/10/2018, CONSULTATION DATE:  11/18/18 REFERRING MD: Hosie Poisson, CHIEF COMPLAINT: Acute on chronic hypoxic respiratory failure  Brief History   73 yo female with PMH significant for chronic respiratory failure on 6 L [place] in setting of pulmonary hypertension and COPD, DM Type II, HLD, Hypothyroidism, ESRD, near bedbound admitted 10/16 with volume overload, COPD AE, and RLL PNA.    History of present illness   73 yo female presenting to ED on 10/15 with non-productive cough and wheezing. She had recent issues with her HD catheter preventing full volume removal on HD session prior to admission.  There was a plan for catheter exchange but patient presented to ED prior to catheter exchange. On admission, CTA Chest completed which was negative for PE.  She was noted to have consolidation of the right middle lobe. She underwent HD catheter exchange and HD was restarted with goal dry weight 99 KG.    Of note, paitent has had multiple hospitalizations over the last several months. Most recently hospitalized 8/28-9/8 for COPD exacerbation and FTT. She was discharged on 7-8 L [place].  Palliative care evaluated on 8/21 and patient was made DNR and found rehospitalization and continued HD as acceptable quality of life.  Past Medical History  Morbid Obesity - BMI 45  ESRD - HD T,Th,S DM II  SDH - discharged 09/07/18 Chronic Hypotension - on midodrine  COPD  Former Smoker - quit 1983, > 1ppd for 22 years. Husband was a smoker and currently lives with a smoker. Chronic hypoxic respiratory failure on 6lpm O2 Severe PH OSA  Significant Hospital Events   10/16 Admitted  Consults:  Nephrology 10/16 PCCM 10/23  Procedures:  10/19: Tunneled HD catheter exchange  Significant Diagnostic Tests:    Micro Data:  MRSA Nares + 8/14 10/16 Blood Cx: NGTD  Antimicrobials:  Aztreonam Vancomycin  Interim  history/subjective:  Patient alert and following commands, denies any dyspnea or shortness of breath.  On Bipap, complaints of not liking the Bipap.  Labs and chart reviewed. Denies any chest pain or abdominal pain/no nausea vomiting. No productive cough.   Objective   Blood pressure (!) 152/82, pulse 68, temperature (!) 97.5 F (36.4 C), temperature source Oral, resp. rate 19, height 5\' 1"  (1.549 m), weight 98.3 kg, SpO2 93 %.        Intake/Output Summary (Last 24 hours) at 11/18/2018 1120 Last data filed at 11/17/2018 1800 Gross per 24 hour  Intake 240 ml  Output 100 ml  Net 140 ml   Filed Weights   11/17/18 0700 11/17/18 1115 11/18/18 0448  Weight: 99.4 kg 96.4 kg 98.3 kg    Examination: General: Alert and oriented  HENT: [place]/AT Lungs: expiratory wheezing in posterior bases.  Coarse lungs throughout.  Cardiovascular: Warm and well perfused, s1 s2.  Trace edema  Abdomen: soft/nontender/nondistended Extremities:no rash or lesions Neuro: A&O, follows commands, MAE.  Globally weak   Resolved Hospital Problem list     Assessment & Plan:  Acute on chronic hypoxic respiratory failure COPD, AE Pulmonary hypertension OSA, non compliant with home CPAP  7-8 L [place] at discharge 9/8 P: -Continue scheduled bronchodilators (Atrovent, Xopenex) , singulair -Ongoing bronchospasm on exam: Continue steroids -Bipap at night.  Education to patient at bedside provided -Acceptable SPO2 > 88%.  RLL PNA -Recommend SLP eval to r/o chronic aspiration  -Aztreonam & Vanco -guaifenesin -Pulmonary hygiene: CPT  ESRD on HD P: -Agree  with Nephrology in attempting to lower dry weight.  -Lasix/HD per nephrology -Midodrine  DM, Type II -Uncontrolled, insulin adjusted by primary team this am  Hypothyroidism -Synthroid  Best practice:  Diet: Recommend SLP eval for RLL PNA Pain/Anxiety/Delirium protocol (if indicated): Not indicated VAP protocol (if indicated): not indicated DVT  prophylaxis: Heparin subq GI prophylaxis: PPI Glucose control: See above Mobility: PT following Code Status: FULL >> Previously DNR.  Palliative care consulted. Need to confirm code status and update in EMR. Disposition: Prog  Labs   CBC: Recent Labs  Lab 11/13/18 0516 11/14/18 0718 11/15/18 0500 11/17/18 0728  WBC 9.7 9.1 13.0* 15.0*  HGB 9.5* 9.7* 10.0* 10.5*  HCT 30.6* 32.8* 32.5* 35.7*  MCV 99.4 99.7 98.8 103.2*  PLT 190 201 204 146*    Basic Metabolic Panel: Recent Labs  Lab 11/13/18 0516 11/14/18 0718 11/15/18 0802 11/17/18 0728  NA 138 136 134* 137  K 4.9 5.2* 5.1 5.1  CL 98 96* 92* 97*  CO2 25 23 22 24   GLUCOSE 172* 203* 220* 247*  BUN 32* 69* 92* 63*  CREATININE 4.49* 6.58* 8.15* 7.20*  CALCIUM 8.5* 8.7* 8.2* 8.8*  PHOS 7.6* 8.5* 9.2* 8.7*   GFR: Estimated Creatinine Clearance: 7.2 mL/min (A) (by C-G formula based on SCr of 7.2 mg/dL (H)). Recent Labs  Lab 11/13/18 0516 11/14/18 0718 11/15/18 0500 11/17/18 0728  WBC 9.7 9.1 13.0* 15.0*    Liver Function Tests: Recent Labs  Lab 11/13/18 0516 11/14/18 0718 11/15/18 0802 11/17/18 0728  ALBUMIN 3.0* 3.1* 3.2* 2.9*   No results for input(s): LIPASE, AMYLASE in the last 168 hours. No results for input(s): AMMONIA in the last 168 hours.  ABG    Component Value Date/Time   PHART 7.369 11/16/2018 1225   PCO2ART 47.7 11/16/2018 1225   PO2ART 66.2 (L) 11/16/2018 1225   HCO3 26.8 11/16/2018 1225   TCO2 30 11/11/2018 0639   ACIDBASEDEF 1.0 11/11/2018 0639   O2SAT 92.5 11/16/2018 1225     Coagulation Profile: No results for input(s): INR, PROTIME in the last 168 hours.  Cardiac Enzymes: No results for input(s): CKTOTAL, CKMB, CKMBINDEX, TROPONINI in the last 168 hours.  HbA1C: Hgb A1c MFr Bld  Date/Time Value Ref Range Status  07/01/2018 11:49 AM 6.1 (H) <5.7 % of total Hgb Final    Comment:    For someone without known diabetes, a hemoglobin  A1c value between 5.7% and 6.4% is  consistent with prediabetes and should be confirmed with a  follow-up test. . For someone with known diabetes, a value <7% indicates that their diabetes is well controlled. A1c targets should be individualized based on duration of diabetes, age, comorbid conditions, and other considerations. . This assay result is consistent with an increased risk of diabetes. . Currently, no consensus exists regarding use of hemoglobin A1c for diagnosis of diabetes for children. .     CBG: Recent Labs  Lab 11/17/18 0628 11/17/18 1315 11/17/18 1632 11/17/18 2058 11/18/18 0758  GLUCAP 231* 137* 169* 272* 201*    Review of Systems:     Past Medical History  She,  has a past medical history of Arthritis, COPD (chronic obstructive pulmonary disease) ([place]), Diabetes mellitus without complication ([place]), Diabetic neuropathy ([place]), Oxygen dependent (03/30/2018), Renal disorder, Thyroid disease, and TIA (transient ischemic attack).   Surgical History    Past Surgical History:  Procedure Laterality Date   ABDOMINAL HYSTERECTOMY     IR FLUORO GUIDE CV LINE RIGHT  11/14/2018  RECTOCELE REPAIR     REPLACEMENT TOTAL KNEE BILATERAL Bilateral 2008   THYROIDECTOMY     80%   VAGINAL WOUND CLOSURE / REPAIR       Social History   reports that she quit smoking about 37 years ago. She has a 35.00 pack-year smoking history. She has never used smokeless tobacco. She reports that she does not drink alcohol or use drugs.   Family History   Her family history includes Heart disease in her father and mother; Hypertension in her father and mother.   Allergies Allergies  Allergen Reactions   Avelox [Moxifloxacin Hcl In Nacl] Other (See Comments)    Unknown reaction   Codeine Anaphylaxis   Penicillins Rash    Did it involve swelling of the face/tongue/throat, SOB, or low BP? Unknown Did it involve sudden or severe rash/hives, skin peeling, or any reaction on the inside of your mouth or nose?  Yes Did you need to seek medical attention at a hospital or doctor's office? Unknown When did it last happen?unknown If all above answers are "NO", may proceed with cephalosporin use.    Sulfa Antibiotics Other (See Comments)    Unknown reaction   Dextromethorphan-Guaifenesin Other (See Comments)    Reported by [facility] - unknown reaction   Shellfish Allergy Hives, Itching and Rash     Home Medications  Prior to Admission medications   Medication Sig Start Date End Date Taking? Authorizing Provider  acetaminophen (TYLENOL) 500 MG tablet Take 1,000 mg by mouth every 6 (six) hours as needed for mild pain.   Yes [provider]  albuterol (PROVENTIL) (2.5 MG/3ML) 0.083% nebulizer solution Take 3 mLs (2.5 mg total) by nebulization 4 (four) times daily. 09/16/18  Yes Mariel Aloe, MD  albuterol (VENTOLIN HFA) 108 (90 Base) MCG/ACT inhaler Inhale 2 puffs into the lungs every 6 (six) hours as needed for wheezing or shortness of breath.   Yes [provider]  clonazePAM (KLONOPIN) 1 MG tablet Take 1 tablet (1 mg total) by mouth 2 (two) times daily as needed for up to 10 days for anxiety. Change in dose Patient taking differently: Take 0.5-1 mg by mouth See admin instructions. Take 1/2 tablet (0.5 mg) by mouth every morning and 1 tablet (1 mg) at night 10/04/18 12/08/18 Yes Pahwani, Ravi, MD  docusate sodium (COLACE) 100 MG capsule Take 100 mg by mouth every morning.   Yes [provider]  FLUoxetine (PROZAC) 10 MG tablet Take 1 tablet (10 mg total) by mouth daily. 08/08/18  Yes Nche, Charlene Brooke, NP  furosemide (LASIX) 40 MG tablet Take 80 mg by mouth See admin instructions. Take 2 tablets (80 mg) by mouth every Sunday, Monday, Wednesday, Friday mornings (non-dialysis days) 10/13/18  Yes [provider]  insulin detemir (LEVEMIR) 100 UNIT/ML injection Inject 0.15 mLs (15 Units total) into the skin daily. 09/16/18  Yes Mariel Aloe, MD  insulin lispro  (HUMALOG) 100 UNIT/ML injection Inject 1-5 Units into the skin 3 (three) times daily with meals as needed for high blood sugar (CBG >150). Sliding scale    Yes [provider]  ipratropium (ATROVENT) 0.02 % nebulizer solution Inhale 0.5 mg into the lungs 4 (four) times daily.    Yes [provider]  levothyroxine (SYNTHROID) 112 MCG tablet Take 1 tablet (112 mcg total) by mouth daily before breakfast. 1 AM Patient taking differently: Take 112 mcg by mouth daily before breakfast.  07/04/18  Yes Nche, Charlene Brooke, NP  lidocaine ([place])  5 % Place 1 patch onto the skin every 12 (twelve) hours as needed (back pain). Remove & Discard patch within 12 hours or as directed by MD    Yes [provider]  midodrine (PROAMATINE) 10 MG tablet Take 10 mg by mouth See admin instructions. Take one tablet (10 mg) by mouth on Tuesday, Thursday, Saturday 45 minutes before dialysis   Yes [provider]  montelukast (SINGULAIR) 10 MG tablet Take 10 mg by mouth at bedtime.    Yes [provider]  nystatin (MYCOSTATIN/NYSTOP) powder Apply 1 g topically 2 (two) times daily as needed (skin rash).    Yes [provider]  nystatin cream (MYCOSTATIN) Apply 1 application topically 2 (two) times daily as needed for dry skin.    Yes [provider]  rosuvastatin (CRESTOR) 40 MG tablet Take 40 mg by mouth every morning.    Yes [provider]  senna (SENOKOT) 8.6 MG TABS tablet Take 1 tablet by mouth every morning.   Yes [provider]  sevelamer (RENAGEL) 800 MG tablet Take 800-1,600 mg by mouth See admin instructions. Take 2 tablets (1600 mg) by mouth three times daily with meals, take 1-2 tablets ([phone] mg) with snacks   Yes [provider]  senna-docusate (SENNA PLUS) 8.6-50 MG tablet Take 1 tablet by mouth at bedtime. Patient not taking: Reported on 11/11/2018 07/04/18   Nche, Charlene Brooke, NP    Attending Note:  73 year old female  with AE-COPD and pulmonary HTN who presents to PCCM with HCAP and acute on chronic respiratory failure requiring BiPAP.  Decompensating overnight and requiring BiPAP.  PCCM consulted for intubation and transfer to the ICU for critical care to assume care.  Upon evaluating the patient, she is alert and interactive but in clear respiratory distress with end exp wheezing.  The patient is also an aspiration risk and I am concerned about ongoing use of BiPAP.  We had a frank discussion with her and her son that given her COPD and PH if the patient is to be intubated then her chances of coming off the vent is near zero and that she will likely require a tracheostomy and long term ventilation and that combined with the fact that she is on dialysis she ill likely require a SNF out of state.  After a long discussion, the patient elected to be DNR (she was DNR in the past and sounds like the topic had not approached).  Given outcome, will not transfer to the ICU for intubation.  Optimized BiPAP best we can.  I reviewed CXR myself, RLL infiltrate noted.  Agree with aztreonam and vanc.  Continue COPD treatment as above.  PCCM will evaluate again on Monday.  Shall the need arise over the weekend please do not hesitate to call back for assistance.  The patient is critically ill with multiple organ systems failure and requires high complexity decision making for assessment and support, frequent evaluation and titration of therapies, application of advanced monitoring technologies and extensive interpretation of multiple databases.   Critical Care Time devoted to patient care services described in this note is  35  Minutes. This time reflects time of care of this signee Dr Jennet Maduro. This critical care time does not reflect procedure time, or teaching time or supervisory time of PA/NP/Med student/Med Resident etc but could involve care discussion time.  Rush Farmer, M.D. [facility] Pulmonary/Critical Care Medicine.

## 2018-11-18 NOTE — Progress Notes (Signed)
RT NOTE: RN called RT to bedside due to patient sating in the 80's. Upon arrival the patient was on bipap and patient was sating 93% on 12/6 and 60%. Patient says breathing is comfortable on bipap. RN at bedside. RT will continue to monitor.

## 2018-11-18 NOTE — Progress Notes (Signed)
PT Cancellation Note  Patient Details Name: Joan Mosley MRN: [id number] DOB: 1945/02/17   Cancelled Treatment:    Reason Eval/Treat Not Completed: Medical issues which prohibited therapy Patient currently on bipap and per last RN note SpO2 desat to 59% when on 15L HFNC. PT will continue to follow acutely.     Earney Navy, PTA Acute Rehabilitation Services Pager: [phone] Office: [phone]   11/18/2018, 2:03 PM

## 2018-11-18 NOTE — Progress Notes (Signed)
[facility] Progress Note   Subjective: 2.4 L off yest on HD.  Off and on bipap w/ marked desat's if not using. DNR now.   Objective Vitals:   11/18/18 0448 11/18/18 0833 11/18/18 0925 11/18/18 1131  BP:      Pulse:    82  Resp:    18  Temp:      TempSrc:      SpO2:  98% 93% 93%  Weight: 98.3 kg     Height:       Physical Exam General: Chronically ill appearing older female, on HD, nasal O2 Heart: S1,S2 RRR SR on monitor. Prominent neck veins, slight JVD Lungs: slight exp wheezing, no rales Abdomen: Obese, NT Extremities: woody appearance BLE, trace -1 + ankle edema. Dialysis Access: RIJ TDC Drsg intact   [facility]    99kg    2K/2Ca     P4   Heparin - none    TDC R chest (now replaced 11/14/18, IR) - Calcitriol 1.56mg PO q HD - Sensipar 656mPO q HD - Venofer 100 x 5 ordered - Mircera 100 q 2 weeks  Assessment/Plan: 1. Dyspnea: COPD/ OHS w/ acute on chronic resp failure due to COPD exac +/- pna +/- vol overload. Bipap dependent now it appears. Vol overload mostly resolved.  CXR's have all been normal. On steroids and nebs, vanc/ aztreonam. Will cont to lower dry wt w/ HD as tolerated.  2. DNR - being seen by pall care, pt is now DNR. Poor prognosis. Met w/ family  3. TDC dysfunction: replaced by IR on 10/19 due to malfunction. Appreciate IR 4. ESRD: Usual TTS sched - HD Sat, lower vol as tol 5. Hypertension/volume- as above. Gets midodrine before HD and midrun 6. Anemia:Hgb 9.5. Give low dose Aranesp weekly 7. Metabolic bone disease: Ca 8.5 /Phos 7.6 Continue home binders/VDRA/sensipar. 8. Hypothyroidism: Continue synthroid.  7. T2DM   8. Nutrition- Renal/Carb mod diet ordered. Albumin 3.0 Renal vits, nepro      RoKelly SplinterMD 11/16/2018, 1:48 PM  Additional Objective Labs: Basic Metabolic Panel: Recent Labs  Lab 11/14/18 0718 11/15/18 0802 11/17/18 0728  NA 136 134* 137  K 5.2* 5.1 5.1  CL 96* 92* 97*  CO2 _0 GLUCOSE 203* 220* 247*  BUN 69* 92* 63*  CREATININE 6.58* 8.15* 7.20*  CALCIUM 8.7* 8.2* 8.8*  PHOS 8.5* 9.2* 8.7*   Liver Function Tests: Recent Labs  Lab 11/14/18 0718 11/15/18 0802 11/17/18 0728  ALBUMIN 3.1* 3.2* 2.9*   No results for input(s): LIPASE, AMYLASE in the last 168 hours. CBC: Recent Labs  Lab 11/13/18 0516 11/14/18 0718 11/15/18 0500 11/17/18 0728  WBC 9.7 9.1 13.0* 15.0*  HGB 9.5* 9.7* 10.0* 10.5*  HCT 30.6* 32.8* 32.5* 35.7*  MCV 99.4 99.7 98.8 103.2*  PLT 190 201 204 146*   Blood Culture    Component Value Date/Time   SDES BLOOD LEFT ARM 11/11/2018 1636   SDES BLOOD LEFT ARM 11/11/2018 1636   SPECREQUEST  11/11/2018 1636    BOTTLES DRAWN AEROBIC AND ANAEROBIC Blood Culture adequate volume   SPECREQUEST  11/11/2018 1636    AEROBIC BOTTLE ONLY Blood Culture results may not be optimal due to an inadequate volume of blood received in culture bottles   CULT  11/11/2018 1636    NO GROWTH 5 DAYS Performed at [id number] [place] [id number]  CULT  11/11/2018 1636  NO GROWTH 5 DAYS Performed at [facility], [place] [address], [place], [place] [address]    REPTSTATUS 11/16/2018 FINAL 11/11/2018 1636   REPTSTATUS 11/16/2018 FINAL 11/11/2018 1636    Cardiac Enzymes: No results for input(s): CKTOTAL, CKMB, CKMBINDEX, TROPONINI in the last 168 hours. CBG: Recent Labs  Lab 11/17/18 1315 11/17/18 1632 11/17/18 2058 11/18/18 0758 11/18/18 1125  GLUCAP 137* 169* 272* 201* 239*   Iron Studies: No results for input(s): IRON, TIBC, TRANSFERRIN, FERRITIN in the last 72 hours. _0 @ Studies/Results: Ct Chest Wo Contrast  Result Date: 11/16/2018 CLINICAL DATA:  Chronic dyspnea, COPD. EXAM: CT CHEST WITHOUT CONTRAST TECHNIQUE: Multidetector CT imaging of the chest was performed following the standard protocol without IV contrast. COMPARISON:  November 10, 2018. FINDINGS: Cardiovascular: And atherosclerosis of  thoracic aorta is noted without aneurysm formation. Normal cardiac size. No pericardial effusion. Coronary artery calcifications are noted. Right internal jugular catheter is noted with tip in right atrium. Enlarged pulmonary arteries are noted suggesting pulmonary artery hypertension. Mediastinum/Nodes: No enlarged mediastinal or axillary lymph nodes. Thyroid gland, trachea, and esophagus demonstrate no significant findings. Lungs/Pleura: No pneumothorax or significant pleural effusion is noted. Large right lower lobe airspace opacity is noted with air bronchograms concerning for pneumonia. Stable scarring is noted in right upper lobe. Mild emphysematous disease is again noted. Upper Abdomen: No acute abnormality. Musculoskeletal: No chest wall mass or suspicious bone lesions identified. IMPRESSION: Large right lower lobe airspace opacity is noted with air bronchograms concerning for pneumonia. Stable enlarged pulmonary arteries are noted suggesting pulmonary artery hypertension. Coronary artery calcifications are noted. Aortic Atherosclerosis (ICD10-I70.0) and Emphysema (ICD10-J43.9). Electronically Signed   By: Marijo Conception M.D.   On: 11/16/2018 15:53   Dg Chest Port 1 View  Result Date: 11/18/2018 CLINICAL DATA:  Hypoxia. EXAM: PORTABLE CHEST 1 VIEW COMPARISON:  Chest x-ray dated November 16, 2018. FINDINGS: The patient is rotated to the right. Unchanged tunneled right internal jugular dialysis catheter. Stable cardiomediastinal silhouette. Unchanged consolidation in the right lower lobe and collapse of the right middle lobe with elevation of the right hemidiaphragm. Diffuse fine granular densities throughout both lungs are unchanged. No pneumothorax. No acute osseous abnormality. IMPRESSION: 1. Unchanged right lower lobe pneumonia and right middle lobe collapse. Electronically Signed   By: Titus Dubin M.D.   On: 11/18/2018 12:08   Medications: . aztreonam    . [START ON 11/19/2018] vancomycin      . calcitRIOL  1.75 mcg Oral Once per day on Tue Thu Sat  . Chlorhexidine Gluconate Cloth  6 each Topical Q0600  . cinacalcet  60 mg Oral Once per day on Tue Thu Sat  . darbepoetin (ARANESP) injection - DIALYSIS  25 mcg Intravenous Q Tue-HD  . feeding supplement (NEPRO CARB STEADY)  237 mL Oral BID BM  . FLUoxetine  10 mg Oral Daily  . furosemide  80 mg Oral Once per day on Sun Mon Wed Fri  . heparin  5,000 Units Subcutaneous Q8H  . insulin aspart  0-15 Units Subcutaneous TID WC  . insulin aspart  0-5 Units Subcutaneous QHS  . [START ON 11/19/2018] insulin detemir  10 Units Subcutaneous Daily  . ipratropium  0.5 mg Inhalation TID  . levalbuterol  0.63 mg Nebulization TID  . levothyroxine  112 mcg Oral Q0600  . [START ON 11/19/2018] methylPREDNISolone (SOLU-MEDROL) injection  60 mg Intravenous Daily  . montelukast  10 mg Oral QHS  . multivitamin  1 tablet Oral QHS  .  rosuvastatin  40 mg Oral Daily  . senna-docusate  1 tablet Oral QHS  . sevelamer carbonate  1,600 mg Oral TID WC

## 2018-11-18 NOTE — Care Management Important Message (Signed)
Important Message  Patient Details  Name: Joan Mosley MRN: [id number] Date of Birth: Dec 27, 1945   Medicare Important Message Given:  Yes     Shelda Altes 11/18/2018, 11:34 AM

## 2018-11-18 NOTE — Progress Notes (Signed)
OT Cancellation Note  Patient Details Name: Joan Mosley MRN: [id number] DOB: 07-Oct-1945   Cancelled Treatment:    Reason Eval/Treat Not Completed: Other (comment) Patient currently on bipap and per last RN note SpO2 desat to 59% when on 15L HFNC. OT will continue to follow acutely per POC.   [place], Ong Acute Rehabilitation Services [place] 11/18/2018, 3:15 PM

## 2018-11-18 NOTE — Progress Notes (Signed)
PROGRESS NOTE    Joan Mosley  [id number] DOB: [birth year] DOA: 11/10/2018 PCP: Flossie Buffy, NP   Brief Narrative:   73 year old lady prior history of pulmonary hypertension, COPD with 6 L of nasal cannula oxygen at home, hyperlipidemia, diabetes mellitus, hypothyroidism, depression, end-stage renal disease on dialysis on Tuesdays Thursdays and Saturdays, recliner bound, morbid obesity presents with generalized weakness and shortness of breath.  On arrival patient was found to be fluid overloaded and hypoxic requiring 100% of nonrebreather. She was weaned off the NRB to 13 to 15  lit of HF oxygen, intermittently requiring BIPAP. Due to her requirement of high flow oxygen despite HD, and IV solumedrol, a CT chest without contrast was done and showed right lower lobe opacity for which she was started on IV vancomycin and IV aztreonam. We were unable to wean her oxygen down and PCCM consulted for further recommendations.     Assessment & Plan:   Principal Problem:   Acute on chronic respiratory failure with hypoxia and hypercapnia (HCC) Active Problems:   Hyperkalemia   Hypothyroidism   ESRD (end stage renal disease) (HCC)   COPD with acute exacerbation (HCC)   TIA (transient ischemic attack)   HLD (hyperlipidemia)   Type II diabetes mellitus with renal manifestations (HCC)   Depression with anxiety   COPD exacerbation (HCC)   Acute on chronic respiratory failure with hypoxia and hypercapnia secondary to hypervolemia and COPD exacerbation and right sided pneumonia.  CTA negative for PE, COVID-19 screening test is negative. Fluid management as per HD. On exam today patient does not appear to be wheezing but has diminished air entry at bases.  We will decrease the dose of IV Solu-Medrol from 40 mg  every 12 hrs  to 60 mg  daily. continue with incentive spirometry, chest physiotherapy, and Lasix . CT chest without contrast showed right  lower lobe pneumonia. She was started  on IV vancomycin and IV Azactam.  She continues to require high flow nasal cannula oxygen at 13 L/min intermittently requiring BiPAP.  PCCM consulted for further evaluation.    ESRD on HD IR consulted for exchange of the HD catheter  Back on the schedule on TTS. Fluid management as per hemodialysis.  Continue with Sensipar and calcitriol.   History of TIA Continue with statin    Hypothyroidism Continue with Synthroid    Hyperkalemia  Resolved with HD.  Potassium is 5.1.   Deconditioning PT evaluation recommending home health PT.    Type 2 diabetes mellitus CBG (last 3)  Recent Labs    11/17/18 1632 11/17/18 2058 11/18/18 0758  GLUCAP 169* 272* 201*   Increase Levemir to 10 units and change the sliding scale insulin to moderate SSI. Check hemoglobin A1c   Depression with anxiety Continue with Klonopin and Prozac.   Hyperlipidemia Continue with Crestor 40 mg daily.   Constipation patient is on senna at bedtime.   In view of multiple medical problems and unable to wean her down to her home oxygen we will get palliative care for goals of care meeting.  DVT prophylaxis: Heparin Code Status: Full code Family Communication: None at bedside  disposition Plan: Pending clinical improvement, possible d/c when weaned to home oxygen.   Consultants:   Nephrology  IR  Vascular surgery  pccm  Palliative care consult.   Procedures: Exchange of the HD catheter  Antimicrobials: IV vancomycin and IV aztreonam since 10/22  Subjective: Pt reports no BM the last two days. No chest pain. Still tachypneic  and low 90% on 15 lit of HF [place] oxygen.   Objective: Vitals:   11/17/18 2011 11/18/18 0438 11/18/18 0448 11/18/18 0833  BP: 134/69 (!) 152/82    Pulse: 75 68    Resp: 20 19    Temp: 98.3 F (36.8 C) (!) 97.5 F (36.4 C)    TempSrc: Oral Oral    SpO2: 95% 95%  98%  Weight:   98.3 kg   Height:        Intake/Output Summary (Last 24 hours) at  11/18/2018 0934 Last data filed at 11/17/2018 1800 Gross per 24 hour  Intake 240 ml  Output 2551 ml  Net -2311 ml   Filed Weights   11/17/18 0700 11/17/18 1115 11/18/18 0448  Weight: 99.4 kg 96.4 kg 98.3 kg    Examination:  General exam: Mild distress from tachypnea on 15 L of high flow nasal cannula oxygen. Respiratory system: Diminished air entry at bases scattered wheezing posteriorly, tachypneic Cardiovascular system: S1-S2 heard, regular rate rhythm Gastrointestinal system: Abdomen is soft, nontender, nondistended, bowel sounds are good Central nervous system: Alert and oriented and answering all questions appropriately. Extremities: Able to move all extremities no cyanosis Skin: No rashes Psychiatry: Patient appears anxious.    Data Reviewed: I have personally reviewed following labs and imaging studies  CBC: Recent Labs  Lab 11/11/18 0939 11/13/18 0516 11/14/18 0718 11/15/18 0500 11/17/18 0728  WBC 13.2* 9.7 9.1 13.0* 15.0*  HGB 10.7* 9.5* 9.7* 10.0* 10.5*  HCT 35.9* 30.6* 32.8* 32.5* 35.7*  MCV 102.3* 99.4 99.7 98.8 103.2*  PLT 210 190 201 204 [id number]*   Basic Metabolic Panel: Recent Labs  Lab 11/11/18 0939 11/13/18 0516 11/14/18 0718 11/15/18 0802 11/17/18 0728  NA 135 138 136 134* 137  K 4.4 4.9 5.2* 5.1 5.1  CL 95* 98 96* 92* 97*  CO2 26 25 23 22 24   GLUCOSE 140* 172* 203* 220* 247*  BUN 21 32* 69* 92* 63*  CREATININE 5.65* 4.49* 6.58* 8.15* 7.20*  CALCIUM 8.2* 8.5* 8.7* 8.2* 8.8*  PHOS 5.3* 7.6* 8.5* 9.2* 8.7*   GFR: Estimated Creatinine Clearance: 7.2 mL/min (A) (by C-G formula based on SCr of 7.2 mg/dL (H)). Liver Function Tests: Recent Labs  Lab 11/11/18 0939 11/13/18 0516 11/14/18 0718 11/15/18 0802 11/17/18 0728  ALBUMIN 3.4* 3.0* 3.1* 3.2* 2.9*   No results for input(s): LIPASE, AMYLASE in the last 168 hours. No results for input(s): AMMONIA in the last 168 hours. Coagulation Profile: No results for input(s): INR, PROTIME in the  last 168 hours. Cardiac Enzymes: No results for input(s): CKTOTAL, CKMB, CKMBINDEX, TROPONINI in the last 168 hours. BNP (last 3 results) No results for input(s): PROBNP in the last 8760 hours. HbA1C: No results for input(s): HGBA1C in the last 72 hours. CBG: Recent Labs  Lab 11/17/18 0628 11/17/18 1315 11/17/18 1632 11/17/18 2058 11/18/18 0758  GLUCAP 231* 137* 169* 272* 201*   Lipid Profile: No results for input(s): CHOL, HDL, LDLCALC, TRIG, CHOLHDL, LDLDIRECT in the last 72 hours. Thyroid Function Tests: No results for input(s): TSH, T4TOTAL, FREET4, T3FREE, THYROIDAB in the last 72 hours. Anemia Panel: No results for input(s): VITAMINB12, FOLATE, FERRITIN, TIBC, IRON, RETICCTPCT in the last 72 hours. Sepsis Labs: No results for input(s): PROCALCITON, LATICACIDVEN in the last 168 hours.  Recent Results (from the past 240 hour(s))  SARS CORONAVIRUS 2 (TAT 6-24 HRS) Nasopharyngeal Nasopharyngeal Swab     Status: None   Collection Time: 11/10/18 11:32 PM   Specimen: Nasopharyngeal  Swab  Result Value Ref Range Status   SARS Coronavirus 2 NEGATIVE NEGATIVE Final    Comment: (NOTE) SARS-CoV-2 target nucleic acids are NOT DETECTED. The SARS-CoV-2 RNA is generally detectable in upper and lower respiratory specimens during the acute phase of infection. Negative results do not preclude SARS-CoV-2 infection, do not rule out co-infections with other pathogens, and should not be used as the sole basis for treatment or other patient management decisions. Negative results must be combined with clinical observations, patient history, and epidemiological information. The expected result is Negative. Fact Sheet for Patients: SugarRoll.be Fact Sheet for Healthcare Providers: https://www.woods-mathews.com/ This test is not yet approved or cleared by the [place] FDA and  has been authorized for detection and/or diagnosis of SARS-CoV-2 by FDA  under an Emergency Use Authorization (EUA). This EUA will remain  in effect (meaning this test can be used) for the duration of the COVID-19 declaration under Section 56 4(b)(1) of the Act, 21 U.S.C. section 360bbb-3(b)(1), unless the authorization is terminated or revoked sooner. Performed at [facility], [place] [address], [place], [place] [address]   Culture, blood (routine x 2)     Status: None   Collection Time: 11/11/18  4:36 PM   Specimen: BLOOD LEFT ARM  Result Value Ref Range Status   Specimen Description BLOOD LEFT ARM  Final   Special Requests   Final    BOTTLES DRAWN AEROBIC AND ANAEROBIC Blood Culture adequate volume   Culture   Final    NO GROWTH 5 DAYS Performed at [facility], [address], [place], [place] [address]    Report Status 11/16/2018 FINAL  Final  Culture, blood (routine x 2)     Status: None   Collection Time: 11/11/18  4:36 PM   Specimen: BLOOD LEFT ARM  Result Value Ref Range Status   Specimen Description BLOOD LEFT ARM  Final   Special Requests   Final    AEROBIC BOTTLE ONLY Blood Culture results may not be optimal due to an inadequate volume of blood received in culture bottles   Culture   Final    NO GROWTH 5 DAYS Performed at [facility], [place] [address], [place], [place] [address]    Report Status 11/16/2018 FINAL  Final         Radiology Studies: Ct Chest Wo Contrast  Result Date: 11/16/2018 CLINICAL DATA:  Chronic dyspnea, COPD. EXAM: CT CHEST WITHOUT CONTRAST TECHNIQUE: Multidetector CT imaging of the chest was performed following the standard protocol without IV contrast. COMPARISON:  November 10, 2018. FINDINGS: Cardiovascular: And atherosclerosis of thoracic aorta is noted without aneurysm formation. Normal cardiac size. No pericardial effusion. Coronary artery calcifications are noted. Right internal jugular catheter is noted with tip in right atrium. Enlarged pulmonary arteries are noted suggesting pulmonary  artery hypertension. Mediastinum/Nodes: No enlarged mediastinal or axillary lymph nodes. Thyroid gland, trachea, and esophagus demonstrate no significant findings. Lungs/Pleura: No pneumothorax or significant pleural effusion is noted. Large right lower lobe airspace opacity is noted with air bronchograms concerning for pneumonia. Stable scarring is noted in right upper lobe. Mild emphysematous disease is again noted. Upper Abdomen: No acute abnormality. Musculoskeletal: No chest wall mass or suspicious bone lesions identified. IMPRESSION: Large right lower lobe airspace opacity is noted with air bronchograms concerning for pneumonia. Stable enlarged pulmonary arteries are noted suggesting pulmonary artery hypertension. Coronary artery calcifications are noted. Aortic Atherosclerosis (ICD10-I70.0) and Emphysema (ICD10-J43.9). Electronically Signed   By: Sabino Dick  Jr M.D.   On: 11/16/2018 15:53   Dg Chest Port 1 View  Result Date: 11/16/2018 CLINICAL DATA:  Hypoxia, shortness of breath, congestion and cough. EXAM: PORTABLE CHEST 1 VIEW COMPARISON:  11/12/2018 chest x-ray and also chest CT 11/10/2018 FINDINGS: The right IJ dialysis catheter is stable. The heart is within normal limits in size and there is stable tortuosity and calcification of the thoracic aorta. Stable underlying emphysematous changes and pulmonary scarring. Persistent bibasilar atelectasis and right pleural effusion or pleural thickening. No pulmonary edema. IMPRESSION: 1. Chronic emphysematous changes and pulmonary scarring. 2. Bibasilar atelectasis and possible small right effusion. 3. No definite infiltrates or edema. Electronically Signed   By: Marijo Sanes M.D.   On: 11/16/2018 13:17        Scheduled Meds: . calcitRIOL  1.75 mcg Oral Once per day on Tue Thu Sat  . Chlorhexidine Gluconate Cloth  6 each Topical Q0600  . cinacalcet  60 mg Oral Once per day on Tue Thu Sat  . darbepoetin (ARANESP) injection - DIALYSIS  25 mcg  Intravenous Q Tue-HD  . feeding supplement (NEPRO CARB STEADY)  237 mL Oral BID BM  . FLUoxetine  10 mg Oral Daily  . furosemide  80 mg Oral Once per day on Sun Mon Wed Fri  . heparin  5,000 Units Subcutaneous Q8H  . insulin aspart  0-9 Units Subcutaneous TID WC  . insulin detemir  7 Units Subcutaneous Daily  . ipratropium  0.5 mg Inhalation TID  . levalbuterol  0.63 mg Nebulization TID  . levothyroxine  112 mcg Oral Q0600  . methylPREDNISolone (SOLU-MEDROL) injection  40 mg Intravenous Q12H  . montelukast  10 mg Oral QHS  . multivitamin  1 tablet Oral QHS  . rosuvastatin  40 mg Oral Daily  . senna-docusate  1 tablet Oral QHS  . sevelamer carbonate  1,600 mg Oral TID WC   Continuous Infusions: . aztreonam    . [START ON 11/19/2018] vancomycin       LOS: 7 days       Hosie Poisson, MD [facility] Pager [phone]  If 7PM-7AM, please contact night-coverage [email or web address] Password [facility] 11/18/2018, 9:34 AM

## 2018-11-18 NOTE — Progress Notes (Signed)
Patient O2 sat in 70's on 15L HFNC. Patient refusing to be placed on BiPAP machine, states she "doesn't like it". Patient desat to 6's. RN convinced patient to be placed on BiPAP. Recovered to 80%. RN called respiratory, respiratory at bedside to adjust BiPap settings. Patient O2 sat now 95%. Will continue to monitor.

## 2018-11-18 NOTE — Consult Note (Signed)
Consultation Note Date: 11/18/2018   Patient Name: Joan Mosley  DOB: 01/24/1946  MRN: [id number]  Age / Sex: 73 y.o., female  PCP: Nche, Charlene Brooke, NP Referring Physician: Hosie Poisson, MD  Reason for Consultation: Establishing goals of care, Non pain symptom management and Psychosocial/spiritual support  HPI/Patient Profile: 73 y.o. female  with past medical history of bilateral SDH 2 months ago, end stage COPD with cor pulmonale on 6L at home, OHS, ESRD on HD, DM, depression and anxiety, who was admitted on 11/10/2018 with volume overload and RLL pneumonia. This is her 6 admission since 08/25/2018.   Clinical Assessment and Goals of Care:  I have reviewed medical records including EPIC notes, labs and imaging, received report from the CCM MD and bedside RN, assessed the patient and then met at the bedside along with Dr. Jonnie Mosley and 3 sons and 1 dtr  to discuss diagnosis prognosis, GOC, EOL wishes, disposition and options.  I introduced Palliative Medicine as specialized medical care for people living with serious illness. It focuses on providing relief from the symptoms and stress of a serious illness. The goal is to improve quality of life for both the patient and the family.  We discussed a brief life review of the patient.  She raised her children and did some babysitting in her home.  She has a small dog "[name]".  She is Nurse, learning disability.  She moved to [place] just 3 - 4 months ago.  We discussed her current illness and what it means in the larger context of her on-going co-morbidities.  Natural disease trajectory and expectations at EOL were discussed.  CCM just saw the patient and confirmed DNR status.  The patient does not want to wear BiPAP.  BiPAP was removed and she was placed on 15L of high flow.  She de-satted into the 8s.    I explained to Lu that her lungs are not improving and that she is  dying.  She immediately said "Oh no, I don't want to die".    I asked to meet with her family privately in a conference room with Dr. Jonnie Mosley.  Her son Joan Mosley is her HCPOA.  Her son Joan Mosley is a paramedic in [place] and is intimately familiar with his mother's health issues.  The patient lives with her son Joan Mosley and daughter Joan Mosley.     Privately I expressed my concern that she either can not understand she is dying or she can not accept it.  Either way it does no good to continually tell her she is dying and expect her to accept it and make appropriate decisions.  She simply can not.  Given her bilateral SDH and her recurrent hypoxia it is quite possible that she has had some brain injury.  She has profound short term memory loss and can not remember what she was told yesterday.    Despite this her children feel she is in her right mind and should be making her own decisions.    Her children do understand she is  dying.  So much so that they have already had Thanksgiving and Christmas as a family so that their mother could share in the holidays.  As a group we tried to brainstorm how to get Lu thru this situation in the best possibly way.  We decided on a 48 hour BiPAP trial using opioid and benzodiazepine to attempt to lower her stress level and decrease her oxygen requirement.  If she is not at all better in 48 hours the children will gather together and talk with her 1 more time about end of life.  We discussed discontinuing hemodialysis as a gentle peaceful way of allowing her to pass.  Hospice and Palliative Care services outpatient were explained and offered.  Questions and concerns were addressed.   The family was encouraged to call with questions or concerns.    Primary Decision Maker:  HCPOA son Joan Mosley, but the children appear to be making decisions together.    SUMMARY OF RECOMMENDATIONS   Will continue BiPAP for a 48 hour timed trial. Will start scheduled ativan, scheduled low dose dilaudid and PRN  dilaudid for SOB. PMT will follow with you and continue communicating with the family.  Code Status/Advance Care Planning:  DNR  Palliative Prophylaxis:   Frequent Pain Assessment  Psycho-social/Spiritual:   Desire for further Chaplaincy support: requested.  Patient is Catholic  Prognosis: days to weeks best case.    Discharge Planning: To Be Determined hospital death is most likely.  Will discuss LTAC with case management.      Primary Diagnoses: Present on Admission: . Hypothyroidism . ESRD (end stage renal disease) ([place]) . COPD with acute exacerbation ([place]) . Acute on chronic respiratory failure with hypoxia and hypercapnia (HCC) . TIA (transient ischemic attack) . HLD (hyperlipidemia) . Type II diabetes mellitus with renal manifestations ([place]) . Depression with anxiety . COPD exacerbation ([place]) . Hyperkalemia   I have reviewed the medical record, interviewed the patient and family, and examined the patient. The following aspects are pertinent.  Past Medical History:  Diagnosis Date  . Arthritis   . COPD (chronic obstructive pulmonary disease) ([place])   . Diabetes mellitus without complication ([place])   . Diabetic neuropathy ([place])   . 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 Renal disorder    ESRD  . Thyroid disease   . TIA (transient ischemic attack)    Social History   Socioeconomic History  . Marital status: Widowed    Spouse name: Not on file  . Number of children: 5  . Years of education: Not on file  . Highest education level: Not on file  Occupational History    Comment: disabled  Social Needs  . Financial resource strain: Not on file  . Food insecurity    Worry: Not on file    Inability: Not on file  . Transportation needs    Medical: Not on file    Non-medical: Not on file  Tobacco Use  . Smoking status: Former Smoker    Packs/day: 1.00    Years: 35.00    Pack years: 35.00    Quit date: 03/29/1981    Years since quitting: 37.6  .  Smokeless tobacco: Never Used  Substance and Sexual Activity  . Alcohol use: Never    Frequency: Never  . Drug use: Never  . Sexual activity: Not Currently  Lifestyle  . Physical activity    Days per week: Not on file    Minutes per session: Not on file  .  Stress: Not on file  Relationships  . Social Herbalist on phone: Not on file    Gets together: Not on file    Attends religious service: Not on file    Active member of club or organization: Not on file    Attends meetings of clubs or organizations: Not on file    Relationship status: Not on file  Other Topics Concern  . Not on file  Social History Narrative  . Not on file   Family History  Problem Relation Age of Onset  . Heart disease Mother   . Hypertension Mother   . Heart disease Father   . Hypertension Father    Scheduled Meds: . calcitRIOL  1.75 mcg Oral Once per day on Tue Thu Sat  . Chlorhexidine Gluconate Cloth  6 each Topical Q0600  . cinacalcet  60 mg Oral Once per day on Tue Thu Sat  . darbepoetin (ARANESP) injection - DIALYSIS  25 mcg Intravenous Q Tue-HD  . feeding supplement (NEPRO CARB STEADY)  237 mL Oral BID BM  . FLUoxetine  10 mg Oral Daily  . furosemide  80 mg Oral Once per day on Sun Mon Wed Fri  . heparin  5,000 Units Subcutaneous Q8H  . insulin aspart  0-15 Units Subcutaneous TID WC  . insulin aspart  0-5 Units Subcutaneous QHS  . [START ON 11/19/2018] insulin detemir  10 Units Subcutaneous Daily  . ipratropium  0.5 mg Inhalation TID  . levalbuterol  0.63 mg Nebulization TID  . levothyroxine  112 mcg Oral Q0600  . [START ON 11/19/2018] methylPREDNISolone (SOLU-MEDROL) injection  60 mg Intravenous Daily  . montelukast  10 mg Oral QHS  . multivitamin  1 tablet Oral QHS  . rosuvastatin  40 mg Oral Daily  . senna-docusate  1 tablet Oral QHS  . sevelamer carbonate  1,600 mg Oral TID WC   Continuous Infusions: . aztreonam    . [START ON 11/19/2018] vancomycin     PRN  Meds:.acetaminophen, chlorhexidine, dextromethorphan-guaiFENesin, heparin, levalbuterol, lidocaine, lidocaine, LORazepam, midodrine, ondansetron (ZOFRAN) IV Allergies  Allergen Reactions  . Avelox [Moxifloxacin Hcl In Nacl] Other (See Comments)    Unknown reaction  . Codeine Anaphylaxis  . Penicillins Rash    Did it involve swelling of the face/tongue/throat, SOB, or low BP? Unknown Did it involve sudden or severe rash/hives, skin peeling, or any reaction on the inside of your mouth or nose? Yes Did you need to seek medical attention at a hospital or doctor's office? Unknown When did it last happen?unknown If all above answers are "NO", may proceed with cephalosporin use.   . Sulfa Antibiotics Other (See Comments)    Unknown reaction  . Dextromethorphan-Guaifenesin Other (See Comments)    Reported by [facility] - unknown reaction  . Shellfish Allergy Hives, Itching and Rash   Review of Systems on BiPAP.  Unable to speak much  Physical Exam  Awake alert on BiPAP, family at bedside. Obese.   Lacks insight necessary for appropriate decision making.  Vital Signs: BP (!) 152/82 (BP Location: Left Arm)   Pulse 82   Temp (!) 97.5 F (36.4 C) (Oral)   Resp 18   Ht _0  (1.549 m)   Wt 98.3 kg   SpO2 93%   BMI 40.95 kg/m  Pain Scale: 0-10   Pain Score: 0-No pain   SpO2: SpO2: 93 % O2 Device:SpO2: 93 % O2 Flow Rate: .O2 Flow Rate (L/min): 15 L/min  IO: Intake/output  summary:   Intake/Output Summary (Last 24 hours) at 11/18/2018 1434 Last data filed at 11/17/2018 1800 Gross per 24 hour  Intake 240 ml  Output 100 ml  Net 140 ml    LBM:   Baseline Weight: Weight: 113.4 kg Most recent weight: Weight: 98.3 kg     Palliative Assessment/Data:20%     Time In: 1:30 Time Out: 3:00 Time Total: 90 min. Visit consisted of counseling and education dealing with the complex and emotionally intense issues surrounding the need for palliative care and symptom management in  the setting of serious and potentially life-threatening illness. Greater than 50%  of this time was spent counseling and coordinating care related to the above assessment and plan.  Signed by: Florentina Jenny, PA-C Palliative Medicine Pager: [phone]  Please contact Palliative Medicine Team phone at [phone] for questions and concerns.  For individual provider: See Shea Evans

## 2018-11-18 NOTE — Progress Notes (Signed)
RN spoke with CCMD and told that patient is now a DNR, received verbal order from CCMD to take patient off of BiPAP, place patient on 15L HFNC and maintain O2 saturation above 85%, if patient desats then place stat call to palliative care.   RN removed patient from BiPAP and placed on 15L HFNC, patient desat to 65%. Placed called to palliative, told that they would attempt to get someone to the room but are severely behind. Called respiratory and asked to come to beside.  Patient desat to 59% and begged to be placed back on BiPAP. Charge RN at beside, placed patient back on BiPAP, O2 sat quickly recovered to 91%. Patient states that she "does not want to die". Explained to patient and family that we would leave patient on BiPAP and let them decide goals of care with palliative.  Palliative care called RN and said they would be at the bedside shortly to meet with patient and family.   Will continue to monitor.

## 2018-11-19 DIAGNOSIS — J690 Pneumonitis due to inhalation of food and vomit: Secondary | ICD-10-CM

## 2018-11-19 LAB — GLUCOSE, CAPILLARY
Glucose-Capillary: 107 mg/dL — ABNORMAL HIGH (ref 70–99)
Glucose-Capillary: 114 mg/dL — ABNORMAL HIGH (ref 70–99)
Glucose-Capillary: 263 mg/dL — ABNORMAL HIGH (ref 70–99)
Glucose-Capillary: 275 mg/dL — ABNORMAL HIGH (ref 70–99)

## 2018-11-19 LAB — RENAL FUNCTION PANEL
Albumin: 3 g/dL — ABNORMAL LOW (ref 3.5–5.0)
Anion gap: 18 — ABNORMAL HIGH (ref 5–15)
BUN: 66 mg/dL — ABNORMAL HIGH (ref 8–23)
CO2: 23 mmol/L (ref 22–32)
Calcium: 8.7 mg/dL — ABNORMAL LOW (ref 8.9–10.3)
Chloride: 99 mmol/L (ref 98–111)
Creatinine, Ser: 6.54 mg/dL — ABNORMAL HIGH (ref 0.44–1.00)
GFR calc Af Amer: 7 mL/min — ABNORMAL LOW (ref >60)
GFR calc non Af Amer: 6 mL/min — ABNORMAL LOW (ref >60)
Glucose, Bld: 107 mg/dL — ABNORMAL HIGH (ref 70–99)
Phosphorus: 9.1 mg/dL — ABNORMAL HIGH (ref 2.5–4.6)
Potassium: 4.3 mmol/L (ref 3.5–5.1)
Sodium: 140 mmol/L (ref 135–145)

## 2018-11-19 LAB — CBC
HCT: 35 % — ABNORMAL LOW (ref 36.0–46.0)
Hemoglobin: 10.7 g/dL — ABNORMAL LOW (ref 12.0–15.0)
MCH: 30.1 pg (ref 26.0–34.0)
MCHC: 30.6 g/dL (ref 30.0–36.0)
MCV: 98.6 fL (ref 80.0–100.0)
Platelets: 176 10*3/uL (ref 150–400)
RBC: 3.55 MIL/uL — ABNORMAL LOW (ref 3.87–5.11)
RDW: 17.1 % — ABNORMAL HIGH (ref 11.5–15.5)
WBC: 12.4 10*3/uL — ABNORMAL HIGH (ref 4.0–10.5)
nRBC: 0.2 % (ref 0.0–0.2)

## 2018-11-19 MED ORDER — VANCOMYCIN HCL IN DEXTROSE 1-5 GM/200ML-% IV SOLN
INTRAVENOUS | Status: AC
  Administered 2018-11-19: 1000 mg via INTRAVENOUS
  Filled 2018-11-19: qty 200

## 2018-11-19 MED ORDER — HEPARIN SODIUM (PORCINE) 1000 UNIT/ML IJ SOLN
INTRAMUSCULAR | Status: AC
  Filled 2018-11-19: qty 4

## 2018-11-19 NOTE — Progress Notes (Signed)
Patient transported from [facility] in BiPAP. Treatment given and then placed on 15L HFNC. Seems to be doing well. Will continue to monitor

## 2018-11-19 NOTE — Progress Notes (Signed)
Daily Progress Note   Patient Name: Joan Mosley       Date: 11/19/2018 DOB: [birth year]  Age: 73 y.o. MRN#: [id number] Attending Physician: Hosie Poisson, MD Primary Care Physician: Flossie Buffy, NP Admit Date: 11/10/2018  Patient has severe short term memory loss and does not remember a conversation from 1 day to the next.  Reason for Consultation/Follow-up: Establishing goals of care and Psychosocial/spiritual support  Subjective: Met with patient at bedside.  She does not remember what we talked about yesterday.  I asked her why she is wearing the biPAP and she tells me because her oxygen drops when she takes it off.  She asks for the BiPAP to come off so she can eat dinner.  She asks me for a new body and laughs - just as she did yesterday.  I asked her how I can help prepare her children for the time when she is not here anymore.  She looks down and says she doesn't know.  I told her we need to prepare them.    I spoke with her son [name] on the phone.  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  He does not feel his mother is getting better.  We spoke about options - possibly LTAC where she would live in a facility (what she said she did not want) on biPAP the majority of the time and be able to receive HD.  We discussed bedsores that come from staying in the same position (on BiPAP) in bed for long periods.  Then we talked about the opposite option - discontinuing HD and focusing on her comfort.  [name] warned me to be direct with his brothers and to not leave any room for hope of recovery - if there is any hope the brothers will delay.  We talked about her having 6 hospitalizations in less than two months - she has reached the point where she can not live outside of the  hospital.   We plan to meet with the family again on 10/25 (Sunday) at 11:00.   Assessment: BiPAP dependent.  ESRD on HD.  RLL PNA.  Aspiration?   Patient Profile/HPI:  73 y.o. 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 This is her 6 admission since 08/25/2018.    Length of Stay: 8  Current Medications: Scheduled Meds:   calcitRIOL  1.75 mcg Oral Once per day on Tue Thu Sat   Chlorhexidine Gluconate Cloth  6 each Topical Q0600   Chlorhexidine Gluconate Cloth  6 each Topical Q0600   cinacalcet  60 mg Oral Once per day on Tue Thu Sat   darbepoetin (ARANESP) injection - DIALYSIS  25 mcg Intravenous Q Tue-HD   feeding supplement (NEPRO CARB STEADY)  237 mL Oral BID BM   FLUoxetine  10 mg Oral Daily   furosemide  80 mg Oral Once per day on Sun Mon Wed Fri   heparin       heparin  5,000 Units Subcutaneous Q8H    HYDROmorphone (DILAUDID) injection  0.25 mg Intravenous Q8H   insulin aspart  0-15 Units Subcutaneous TID WC   insulin aspart  0-5 Units Subcutaneous QHS   insulin detemir  10 Units Subcutaneous Daily   ipratropium  0.5 mg Inhalation TID   levalbuterol  0.63 mg Nebulization TID   levothyroxine  112 mcg Oral Q0600   LORazepam  0.5 mg Intravenous Q6H   methylPREDNISolone (SOLU-MEDROL) injection  60 mg Intravenous Daily   montelukast  10 mg Oral QHS   multivitamin  1 tablet Oral QHS   rosuvastatin  40 mg Oral Daily   senna-docusate  1 tablet Oral QHS   sevelamer carbonate  1,600 mg Oral TID WC    Continuous Infusions:  aztreonam 0.5 g (11/19/18 1553)   vancomycin 1,000 mg (11/19/18 1106)    PRN Meds: acetaminophen, chlorhexidine, dextromethorphan-guaiFENesin, heparin, HYDROmorphone (DILAUDID) injection, levalbuterol, lidocaine, lidocaine, LORazepam, midodrine,  ondansetron (ZOFRAN) IV  Physical Exam        Obese female, awake, alert, on BiPAP.    Vital Signs: BP (!) 92/52    Pulse 90    Temp (!) 97.3 F (36.3 C) (Oral)    Resp (!) 24    Ht 5' 1"  (1.549 m)    Wt 98.6 kg    SpO2 95%    BMI 41.07 kg/m  SpO2: SpO2: 95 % O2 Device: O2 Device: Bi-PAP O2 Flow Rate: O2 Flow Rate (L/min): 15 L/min  Intake/output summary:   Intake/Output Summary (Last 24 hours) at 11/19/2018 1958 Last data filed at 11/19/2018 1206 Gross per 24 hour  Intake --  Output 854 ml  Net -854 ml   LBM:   Baseline Weight: Weight: 113.4 kg Most recent weight: Weight: 98.6 kg       Palliative Assessment/Data: 10%      Patient Active Problem List   Diagnosis Date Noted   Palliative care encounter    COPD exacerbation ([place]) 11/11/2018   TIA (transient ischemic attack)    HLD (hyperlipidemia)    Type II diabetes mellitus with renal manifestations ([place])    Depression with anxiety    COPD with acute exacerbation ([place]) 09/30/2018   Acute respiratory failure with hypoxia ([place]) 09/24/2018   Advanced care planning/counseling discussion    Goals of care, counseling/discussion    Palliative care by specialist    Acute on chronic respiratory failure with hypoxia and hypercapnia ([place]) 09/15/2018   Pulmonary edema 09/08/2018   Respiratory failure ([place]) 09/08/2018   Weakness    SDH (subdural hematoma) ([place]) 09/06/2018   Chronic respiratory failure with hypoxia, on home O2 therapy (  [place]) 09/06/2018   Subdural hematoma ([place]) 08/22/2018   Elevated troponin 08/22/2018   ESRD (end stage renal disease) ([place]) 08/22/2018   Anxiety 07/04/2018   Counseling regarding advanced directives and goals of care 07/01/2018   Full code status 07/01/2018   Drug-induced constipation 07/01/2018   Hemodialysis-associated hypotension 07/01/2018   Dialysis patient ([place]) 07/01/2018   HOH (hard of hearing) 07/01/2018   Stage 5 chronic kidney disease on chronic  dialysis ([place]) 07/01/2018   Type 2 diabetes mellitus with diabetic polyneuropathy, with long-term current use of insulin ([place]) 07/01/2018   Hypothyroidism 07/01/2018   Gait instability 07/01/2018   Hyperkalemia 04/20/2018    Palliative Care Plan    Recommendations/Plan:  Family reports conflicting messages from physicians - I will to touch base with the medical team tomorrow to develop a consistent message.  Family meeting scheduled for 11:00 Sunday.  Check with case management to see if LTAC is a possibility  Goals of Care and Additional Recommendations:  Limitations on Scope of Treatment: Full Scope Treatment  Code Status:  DNR  Prognosis:   days to weeks depending on the goals of care.  Discharge Planning:  To Be Determined  Care plan was discussed with family, Nursing.  Thank you for allowing the Palliative Medicine Team to assist in the care of this patient.  Total time spent:  35 min.     Greater than 50%  of this time was spent counseling and coordinating care related to the above assessment and plan.  Florentina Jenny, PA-C Palliative Medicine  Please contact Palliative MedicineTeam phone at [phone] for questions and concerns between 7 am - 7 pm.   Please see AMION for individual provider pager numbers.

## 2018-11-19 NOTE — Progress Notes (Signed)
Patient placed back on BiPAP due to decreased SAT

## 2018-11-19 NOTE — Progress Notes (Addendum)
[facility] Progress Note   Dialysis Orders: [facility] 4h    99kg    2K/2Ca     P4   Heparin - none    TDC R chest (now replaced 11/14/18, IR) - Calcitriol 1.59mg PO q HD - Sensipar 675mPO q HD - Venofer 100 x 5 ordered - Mircera 100 q 2 weeks  Assessment/Plan: 1. Dyspnea: COPD/ OHS w/ acute on chronic resp failure due to COPD exac +/- pna +/- vol overload. Bipap dependent now it appears. Vol overload mostly resolved.  CXR's have all been normal. On steroids and nebs, vanc/ aztreonam. Will cont to lower dry wt w/ HD as tolerated.  2. DNR/Goals of Care -  palliative care and Dr. ScJonnie Finneret with family yesterday. To discuss goals of care - please refer to complete note by MaFlorentina JennyPA-C - trying; current plan to try 48 hr of BiPAP, schedule ativan and pain meds; prognosis poor  3. TDC dysfunction: replaced by IR on 10/19 due to malfunction. 4. ESRD: Usual TTS sched - HD today, lower vol as tol 5. Hypertension/volume- as above. Gets midodrine before HD and midrun 6. Anemia:Hgb 10.5 Give low dose Aranesp weekly 7. Metabolic bone disease: Continue home binders/VDRA/sensipar. 8. Hypothyroidism: Continue synthroid.  7. T2DM   8. Nutrition- Renal/Carb mod diet orderedRenal vits, nepro  MaPound0/24/2020,7:40 AM  LOS: 8 days   I have seen and examined this patient and agree with the plan of care. Seen on HD today 2K tolerating UF with VSS Mainly c/o dyspnea but denies f/c/n/v/myalgias Gently challenging EDW. Agree that prognosis is very poor and will offer supportive care. Appreciate hospice and palliative care seeing this pt and supporting the family as well.  LIDwana MelenaMD 11/19/2018, 9:38 AM  Subjective:   Sats ok on BiPAP.  C/o being hungry. Just arrived in HD unit. Denies SOB.  Objective Vitals:   11/18/18 2035 11/18/18 2330 11/19/18 0355 11/19/18 0436  BP: 130/73   113/61   Pulse: 70 63 60 65  Resp: _0 Temp: 97.7 F (36.5 C)   97.6 F (36.4 C)  TempSrc: Oral   Axillary  SpO2: 99% 98% 97% 96%  Weight:    98.8 kg  Height:       Physical Exam General: chronically ill female. Tolerating BiPAP no distress Heart: RRR Lungs: some exp wheezes - BS more coarse on right anteriorly Abdomen: soft NT obese Extremities:1 + LE edema Dialysis Access:  [facility]    Additional Objective Labs: Basic Metabolic Panel: Recent Labs  Lab 11/14/18 0718 11/15/18 0802 11/17/18 0728  NA 136 134* 137  K 5.2* 5.1 5.1  CL 96* 92* 97*  CO2 _1 GLUCOSE 203* 220* 247*  BUN 69* 92* 63*  CREATININE 6.58* 8.15* 7.20*  CALCIUM 8.7* 8.2* 8.8*  PHOS 8.5* 9.2* 8.7*   Liver Function Tests: Recent Labs  Lab 11/14/18 0718 11/15/18 0802 11/17/18 0728  ALBUMIN 3.1* 3.2* 2.9*   No results for input(s): LIPASE, AMYLASE in the last 168 hours. CBC: Recent Labs  Lab 11/13/18 0516 11/14/18 0718 11/15/18 0500 11/17/18 0728  WBC 9.7 9.1 13.0* 15.0*  HGB 9.5* 9.7* 10.0* 10.5*  HCT 30.6* 32.8* 32.5* 35.7*  MCV 99.4 99.7 98.8 103.2*  PLT 190 201 204 146*   Blood Culture    Component Value Date/Time   SDES BLOOD LEFT ARM 11/11/2018 1636  SDES BLOOD LEFT ARM 11/11/2018 1636   SPECREQUEST  11/11/2018 1636    BOTTLES DRAWN AEROBIC AND ANAEROBIC Blood Culture adequate volume   SPECREQUEST  11/11/2018 1636    AEROBIC BOTTLE ONLY Blood Culture results may not be optimal due to an inadequate volume of blood received in culture bottles   CULT  11/11/2018 1636    NO GROWTH 5 DAYS Performed at [facility], [place] [address], [place], [place] [address]    CULT  11/11/2018 1636    NO GROWTH 5 DAYS Performed at [facility], [place] [address] [place], [place] [address]    REPTSTATUS 11/16/2018 FINAL 11/11/2018 1636   REPTSTATUS 11/16/2018 FINAL 11/11/2018 1636    Cardiac Enzymes: No results for input(s): CKTOTAL, CKMB, CKMBINDEX,  TROPONINI in the last 168 hours. CBG: Recent Labs  Lab 11/17/18 2058 11/18/18 0758 11/18/18 1125 11/18/18 1646 11/18/18 2123  GLUCAP 272* 201* 239* 202* 138*   Iron Studies: No results for input(s): IRON, TIBC, TRANSFERRIN, FERRITIN in the last 72 hours. Lab Results  Component Value Date   INR 1.1 09/08/2018   INR 1.0 08/22/2018   Studies/Results: Dg Chest Port 1 View  Result Date: 11/18/2018 CLINICAL DATA:  Hypoxia. EXAM: PORTABLE CHEST 1 VIEW COMPARISON:  Chest x-ray dated November 16, 2018. FINDINGS: The patient is rotated to the right. Unchanged tunneled right internal jugular dialysis catheter. Stable cardiomediastinal silhouette. Unchanged consolidation in the right lower lobe and collapse of the right middle lobe with elevation of the right hemidiaphragm. Diffuse fine granular densities throughout both lungs are unchanged. No pneumothorax. No acute osseous abnormality. IMPRESSION: 1. Unchanged right lower lobe pneumonia and right middle lobe collapse. Electronically Signed   By: Titus Dubin M.D.   On: 11/18/2018 12:08   Medications: . aztreonam 0.5 g (11/18/18 2250)  . vancomycin     . calcitRIOL  1.75 mcg Oral Once per day on Tue Thu Sat  . Chlorhexidine Gluconate Cloth  6 each Topical Q0600  . Chlorhexidine Gluconate Cloth  6 each Topical Q0600  . cinacalcet  60 mg Oral Once per day on Tue Thu Sat  . darbepoetin (ARANESP) injection - DIALYSIS  25 mcg Intravenous Q Tue-HD  . feeding supplement (NEPRO CARB STEADY)  237 mL Oral BID BM  . FLUoxetine  10 mg Oral Daily  . furosemide  80 mg Oral Once per day on Sun Mon Wed Fri  . heparin  5,000 Units Subcutaneous Q8H  .  HYDROmorphone (DILAUDID) injection  0.25 mg Intravenous Q8H  . insulin aspart  0-15 Units Subcutaneous TID WC  . insulin aspart  0-5 Units Subcutaneous QHS  . insulin detemir  10 Units Subcutaneous Daily  . ipratropium  0.5 mg Inhalation TID  . levalbuterol  0.63 mg Nebulization TID  . levothyroxine   112 mcg Oral Q0600  . LORazepam  0.5 mg Intravenous Q6H  . methylPREDNISolone (SOLU-MEDROL) injection  60 mg Intravenous Daily  . montelukast  10 mg Oral QHS  . multivitamin  1 tablet Oral QHS  . rosuvastatin  40 mg Oral Daily  . senna-docusate  1 tablet Oral QHS  . sevelamer carbonate  1,600 mg Oral TID WC

## 2018-11-19 NOTE — Progress Notes (Signed)
PROGRESS NOTE    Joan Mosley  [id number] DOB: 1945-04-27 DOA: 11/10/2018 PCP: Flossie Buffy, NP   Brief Narrative:   73 year old lady prior history of pulmonary hypertension, COPD with 6 L of nasal cannula oxygen at home, hyperlipidemia, diabetes mellitus, hypothyroidism, depression, end-stage renal disease on dialysis on Tuesdays Thursdays and Saturdays, recliner bound, morbid obesity presents with generalized weakness and shortness of breath.  On arrival patient was found to be fluid overloaded and hypoxic requiring 100% of nonrebreather. She was weaned off the NRB to 13 to 15  lit of HF oxygen, intermittently requiring BIPAP. Due to her requirement of high flow oxygen despite HD, and IV solumedrol, a CT chest without contrast was done and showed right lower lobe opacity for which she was started on IV vancomycin and IV aztreonam. We were unable to wean her oxygen down and PCCM consulted for further recommendations. Pt back on BIPAP due to low oxygen sats.  Palliative care consulted for goals of care and poor progression.    Assessment & Plan:   Principal Problem:   Acute on chronic respiratory failure with hypoxia and hypercapnia (HCC) Active Problems:   Hyperkalemia   Hypothyroidism   ESRD (end stage renal disease) (HCC)   COPD with acute exacerbation (HCC)   TIA (transient ischemic attack)   HLD (hyperlipidemia)   Type II diabetes mellitus with renal manifestations (HCC)   Depression with anxiety   COPD exacerbation (HCC)   Palliative care encounter   Acute on chronic respiratory failure with hypoxia and hypercapnia secondary to hypervolemia and COPD exacerbation and right sided pneumonia.  CTA negative for PE, COVID-19 screening test is negative. Fluid management as per HD. On exam today patient does not appear to be wheezing but has   significant diminished air entry at bases.   continue with incentive spirometry, chest physiotherapy, and Lasix . CT chest  without contrast showed right  lower lobe pneumonia. She was started on IV vancomycin and IV Azactam.  She continues to require high flow nasal cannula oxygen at 13 L/min intermittently requiring BiPAP.  PCCM consulted for further evaluation. Appreciate PCCM recommendations.continuewith BIPAP as much as we can. If no improvement in the next 24 hours, will need to have further [place] discussions with the patient and family   ESRD on HD IR consulted for exchange of the HD catheter  Back on the schedule on TTS. Fluid management as per hemodialysis.  Continue with Sensipar and calcitriol.   History of TIA Continue with statin    Hypothyroidism Continue with Synthroid    Hyperkalemia  Resolved.    Deconditioning PT evaluation recommending home health PT.    Type 2 diabetes mellitus CBG (last 3)  Recent Labs    11/19/18 0745 11/19/18 1312 11/19/18 1629  GLUCAP 107* 114* 263*   Increase Levemir to 10 units and change the sliding scale insulin to moderate SSI. No changes in SSI.   Depression with anxiety Continue with Klonopin and Prozac.   Hyperlipidemia Continue with Crestor 40 mg daily.   Constipation patient is on senna at bedtime.   In view of multiple medical problems and unable to wean her down to her home oxygen we will get palliative care for goals of care meeting.  MONITOR FOR 48 HOURS AND FURTHER GOC DISCUSSION TO ENSUE IF NO IMPROVEMENT.   DVT prophylaxis: Heparin Code Status: Full code Family Communication: None at bedside  disposition Plan: Pending clinical improvement, possible d/c when weaned to home oxygen.   Consultants:  Nephrology  IR  Vascular surgery  pccm  Palliative care consult.   Procedures: Exchange of the HD catheter  Antimicrobials: IV vancomycin and IV aztreonam since 10/22  Subjective: On bipap,no chest pain .   Objective: Vitals:   11/19/18 1206 11/19/18 1356 11/19/18 1500 11/19/18 1600  BP: (!) 108/50 108/71 (!)  92/52   Pulse: 76 81 95 90  Resp: 20 (!) 21 (!) 24 (!) 23  Temp: (!) 97.3 F (36.3 C)     TempSrc: Oral     SpO2: 98% 99% 92% 95%  Weight:      Height:        Intake/Output Summary (Last 24 hours) at 11/19/2018 1754 Last data filed at 11/19/2018 1206 Gross per 24 hour  Intake --  Output 854 ml  Net -854 ml   Filed Weights   11/18/18 0448 11/19/18 0436 11/19/18 0819  Weight: 98.3 kg 98.8 kg 98.6 kg    Examination:  General exam: mod distress, on BIPAP Respiratory system: diminished air entry, tachypnea present.  Cardiovascular system: s1s2, RRR.  Gastrointestinal system: abd is soft NT ND BS+ Central nervous system: alert and answering questions appropriately.  Extremities: no cyanosis.  Skin: no rashes.  Psychiatry:mood is appropriate.     Data Reviewed: I have personally reviewed following labs and imaging studies  CBC: Recent Labs  Lab 11/13/18 0516 11/14/18 0718 11/15/18 0500 11/17/18 0728 11/19/18 0842  WBC 9.7 9.1 13.0* 15.0* 12.4*  HGB 9.5* 9.7* 10.0* 10.5* 10.7*  HCT 30.6* 32.8* 32.5* 35.7* 35.0*  MCV 99.4 99.7 98.8 103.2* 98.6  PLT 190 201 204 146* 0000000   Basic Metabolic Panel: Recent Labs  Lab 11/13/18 0516 11/14/18 0718 11/15/18 0802 11/17/18 0728 11/19/18 0842  NA 138 136 134* 137 140  K 4.9 5.2* 5.1 5.1 4.3  CL 98 96* 92* 97* 99  CO2 25 23 22 24 23   GLUCOSE 172* 203* 220* 247* 107*  BUN 32* 69* 92* 63* 66*  CREATININE 4.49* 6.58* 8.15* 7.20* 6.54*  CALCIUM 8.5* 8.7* 8.2* 8.8* 8.7*  PHOS 7.6* 8.5* 9.2* 8.7* 9.1*   GFR: Estimated Creatinine Clearance: 8 mL/min (A) (by C-G formula based on SCr of 6.54 mg/dL (H)). Liver Function Tests: Recent Labs  Lab 11/13/18 0516 11/14/18 0718 11/15/18 0802 11/17/18 0728 11/19/18 0842  ALBUMIN 3.0* 3.1* 3.2* 2.9* 3.0*   No results for input(s): LIPASE, AMYLASE in the last 168 hours. No results for input(s): AMMONIA in the last 168 hours. Coagulation Profile: No results for input(s): INR,  PROTIME in the last 168 hours. Cardiac Enzymes: No results for input(s): CKTOTAL, CKMB, CKMBINDEX, TROPONINI in the last 168 hours. BNP (last 3 results) No results for input(s): PROBNP in the last 8760 hours. HbA1C: Recent Labs    11/18/18 1120  HGBA1C 5.6   CBG: Recent Labs  Lab 11/18/18 1646 11/18/18 2123 11/19/18 0745 11/19/18 1312 11/19/18 1629  GLUCAP 202* 138* 107* 114* 263*   Lipid Profile: No results for input(s): CHOL, HDL, LDLCALC, TRIG, CHOLHDL, LDLDIRECT in the last 72 hours. Thyroid Function Tests: No results for input(s): TSH, T4TOTAL, FREET4, T3FREE, THYROIDAB in the last 72 hours. Anemia Panel: No results for input(s): VITAMINB12, FOLATE, FERRITIN, TIBC, IRON, RETICCTPCT in the last 72 hours. Sepsis Labs: No results for input(s): PROCALCITON, LATICACIDVEN in the last 168 hours.  Recent Results (from the past 240 hour(s))  SARS CORONAVIRUS 2 (TAT 6-24 HRS) Nasopharyngeal Nasopharyngeal Swab     Status: None   Collection Time: 11/10/18 11:32  PM   Specimen: Nasopharyngeal Swab  Result Value Ref Range Status   SARS Coronavirus 2 NEGATIVE NEGATIVE Final    Comment: (NOTE) SARS-CoV-2 target nucleic acids are NOT DETECTED. The SARS-CoV-2 RNA is generally detectable in upper and lower respiratory specimens during the acute phase of infection. Negative results do not preclude SARS-CoV-2 infection, do not rule out co-infections with other pathogens, and should not be used as the sole basis for treatment or other patient management decisions. Negative results must be combined with clinical observations, patient history, and epidemiological information. The expected result is Negative. Fact Sheet for Patients: SugarRoll.be Fact Sheet for Healthcare Providers: https://www.woods-mathews.com/ This test is not yet approved or cleared by the [place] FDA and  has been authorized for detection and/or diagnosis of SARS-CoV-2  by FDA under an Emergency Use Authorization (EUA). This EUA will remain  in effect (meaning this test can be used) for the duration of the COVID-19 declaration under Section 56 4(b)(1) of the Act, 21 U.S.C. section 360bbb-3(b)(1), unless the authorization is terminated or revoked sooner. Performed at [facility], [place] [address], [place], [place] [address]   Culture, blood (routine x 2)     Status: None   Collection Time: 11/11/18  4:36 PM   Specimen: BLOOD LEFT ARM  Result Value Ref Range Status   Specimen Description BLOOD LEFT ARM  Final   Special Requests   Final    BOTTLES DRAWN AEROBIC AND ANAEROBIC Blood Culture adequate volume   Culture   Final    NO GROWTH 5 DAYS Performed at [facility], [address], [place], [place] [address]    Report Status 11/16/2018 FINAL  Final  Culture, blood (routine x 2)     Status: None   Collection Time: 11/11/18  4:36 PM   Specimen: BLOOD LEFT ARM  Result Value Ref Range Status   Specimen Description BLOOD LEFT ARM  Final   Special Requests   Final    AEROBIC BOTTLE ONLY Blood Culture results may not be optimal due to an inadequate volume of blood received in culture bottles   Culture   Final    NO GROWTH 5 DAYS Performed at [facility], [place] [address] [place], [place] [address]    Report Status 11/16/2018 FINAL  Final         Radiology Studies: Dg Chest Port 1 View  Result Date: 11/18/2018 CLINICAL DATA:  Hypoxia. EXAM: PORTABLE CHEST 1 VIEW COMPARISON:  Chest x-ray dated November 16, 2018. FINDINGS: The patient is rotated to the right. Unchanged tunneled right internal jugular dialysis catheter. Stable cardiomediastinal silhouette. Unchanged consolidation in the right lower lobe and collapse of the right middle lobe with elevation of the right hemidiaphragm. Diffuse fine granular densities throughout both lungs are unchanged. No pneumothorax. No acute osseous abnormality. IMPRESSION: 1. Unchanged right lower  lobe pneumonia and right middle lobe collapse. Electronically Signed   By: Titus Dubin M.D.   On: 11/18/2018 12:08        Scheduled Meds:  calcitRIOL  1.75 mcg Oral Once per day on Tue Thu Sat   Chlorhexidine Gluconate Cloth  6 each Topical Q0600   Chlorhexidine Gluconate Cloth  6 each Topical Q0600   cinacalcet  60 mg Oral Once per day on Tue Thu Sat   darbepoetin (ARANESP) injection - DIALYSIS  25 mcg Intravenous Q Tue-HD   feeding supplement (NEPRO CARB STEADY)  237 mL Oral BID BM   FLUoxetine  10 mg Oral Daily   furosemide  80 mg Oral Once per day on Sun Mon Wed Fri   heparin       heparin  5,000 Units Subcutaneous Q8H    HYDROmorphone (DILAUDID) injection  0.25 mg Intravenous Q8H   insulin aspart  0-15 Units Subcutaneous TID WC   insulin aspart  0-5 Units Subcutaneous QHS   insulin detemir  10 Units Subcutaneous Daily   ipratropium  0.5 mg Inhalation TID   levalbuterol  0.63 mg Nebulization TID   levothyroxine  112 mcg Oral Q0600   LORazepam  0.5 mg Intravenous Q6H   methylPREDNISolone (SOLU-MEDROL) injection  60 mg Intravenous Daily   montelukast  10 mg Oral QHS   multivitamin  1 tablet Oral QHS   rosuvastatin  40 mg Oral Daily   senna-docusate  1 tablet Oral QHS   sevelamer carbonate  1,600 mg Oral TID WC   Continuous Infusions:  aztreonam 0.5 g (11/19/18 1553)   vancomycin 1,000 mg (11/19/18 1106)     LOS: 8 days       Hosie Poisson, MD [facility] Pager [phone]  If 7PM-7AM, please contact night-coverage [email or web address] Password [facility] 11/19/2018, 5:54 PM

## 2018-11-19 NOTE — Progress Notes (Signed)
SLP Cancellation Note  Patient Details Name: Sharlotte Flam MRN: [id number] DOB: 1945/02/19   Cancelled treatment:       Reason Eval/Treat Not Completed: Patient at procedure or test/unavailable   Elvina Sidle, M.S., CCC-SLP 11/19/2018, 9:40 AM

## 2018-11-20 LAB — GLUCOSE, CAPILLARY
Glucose-Capillary: 159 mg/dL — ABNORMAL HIGH (ref 70–99)
Glucose-Capillary: 196 mg/dL — ABNORMAL HIGH (ref 70–99)
Glucose-Capillary: 371 mg/dL — ABNORMAL HIGH (ref 70–99)
Glucose-Capillary: 230 mg/dL — ABNORMAL HIGH (ref 70–99)

## 2018-11-20 LAB — LEGIONELLA PNEUMOPHILA SEROGP 1 UR AG: L. pneumophila Serogp 1 Ur Ag: NEGATIVE

## 2018-11-20 MED ORDER — LORAZEPAM 2 MG/ML IJ SOLN
0.5000 mg | Freq: Four times a day (QID) | INTRAMUSCULAR | Status: DC | PRN
  Administered 2018-11-20: 22:00:00 0.5 mg via INTRAVENOUS
  Filled 2018-11-20: qty 1

## 2018-11-20 MED ORDER — PREDNISONE 20 MG PO TABS
40.0000 mg | ORAL_TABLET | Freq: Every day | ORAL | Status: AC
  Administered 2018-11-20 – 2018-11-22 (×3): 40 mg via ORAL
  Filled 2018-11-20 (×3): qty 2

## 2018-11-20 MED ORDER — LEVOTHYROXINE SODIUM 112 MCG PO TABS
112.0000 ug | ORAL_TABLET | Freq: Every day | ORAL | Status: DC
  Administered 2018-11-21 – 2018-11-22 (×2): 112 ug via ORAL
  Filled 2018-11-20 (×2): qty 1

## 2018-11-20 MED ORDER — LORAZEPAM 2 MG/ML IJ SOLN: 0.5000 mg | Freq: Two times a day (BID) | INTRAMUSCULAR | Status: DC

## 2018-11-20 MED ORDER — ROSUVASTATIN CALCIUM 20 MG PO TABS
40.0000 mg | ORAL_TABLET | Freq: Every day | ORAL | Status: DC
  Administered 2018-11-20 – 2018-11-21 (×2): 40 mg via ORAL
  Filled 2018-11-20 (×2): qty 2

## 2018-11-20 MED ORDER — FUROSEMIDE 80 MG PO TABS
80.0000 mg | ORAL_TABLET | ORAL | Status: DC
  Administered 2018-11-20 – 2018-11-21 (×2): 80 mg via ORAL
  Filled 2018-11-20 (×2): qty 1

## 2018-11-20 MED ORDER — INSULIN ASPART 100 UNIT/ML ~~LOC~~ SOLN
0.0000 [IU] | Freq: Three times a day (TID) | SUBCUTANEOUS | Status: DC
  Administered 2018-11-20: 13:00:00 2 [IU] via SUBCUTANEOUS
  Administered 2018-11-20: 9 [IU] via SUBCUTANEOUS
  Administered 2018-11-21: 13:00:00 3 [IU] via SUBCUTANEOUS
  Administered 2018-11-21: 7 [IU] via SUBCUTANEOUS

## 2018-11-20 MED ORDER — INSULIN ASPART 100 UNIT/ML ~~LOC~~ SOLN: 0.0000 [IU] | Freq: Every day | SUBCUTANEOUS | Status: DC

## 2018-11-20 MED ORDER — LORAZEPAM 2 MG/ML IJ SOLN: 0.5000 mg | Freq: Two times a day (BID) | INTRAMUSCULAR | Status: DC | PRN

## 2018-11-20 MED ORDER — SEVELAMER CARBONATE 800 MG PO TABS
1600.0000 mg | ORAL_TABLET | Freq: Three times a day (TID) | ORAL | Status: DC
  Administered 2018-11-20 – 2018-11-22 (×5): 1600 mg via ORAL
  Filled 2018-11-20 (×5): qty 2

## 2018-11-20 MED ORDER — LORAZEPAM 2 MG/ML IJ SOLN: 0.2500 mg | Freq: Two times a day (BID) | INTRAMUSCULAR | Status: DC

## 2018-11-20 MED ORDER — VANCOMYCIN HCL IN DEXTROSE 1-5 GM/200ML-% IV SOLN
1000.0000 mg | INTRAVENOUS | Status: DC
  Administered 2018-11-22: 14:00:00 1000 mg via INTRAVENOUS
  Filled 2018-11-20: qty 200

## 2018-11-20 MED ORDER — DEXTROSE 5 % IV SOLN
0.5000 g | Freq: Two times a day (BID) | INTRAVENOUS | Status: DC
  Administered 2018-11-20 – 2018-11-22 (×4): 0.5 g via INTRAVENOUS
  Filled 2018-11-20 (×6): qty 0.5

## 2018-11-20 MED ORDER — INSULIN DETEMIR 100 UNIT/ML ~~LOC~~ SOLN
10.0000 [IU] | Freq: Every day | SUBCUTANEOUS | Status: DC
  Administered 2018-11-20 – 2018-11-22 (×3): 10 [IU] via SUBCUTANEOUS
  Filled 2018-11-20 (×3): qty 0.1

## 2018-11-20 MED ORDER — NEPRO/CARBSTEADY PO LIQD: 237.0000 mL | Freq: Two times a day (BID) | ORAL | Status: DC

## 2018-11-20 MED ORDER — FLUOXETINE HCL 10 MG PO CAPS
10.0000 mg | ORAL_CAPSULE | Freq: Every day | ORAL | Status: DC
  Administered 2018-11-20 – 2018-11-22 (×3): 10 mg via ORAL
  Filled 2018-11-20 (×3): qty 1

## 2018-11-20 MED ORDER — CINACALCET HCL 30 MG PO TABS
60.0000 mg | ORAL_TABLET | ORAL | Status: DC
  Administered 2018-11-22: 60 mg via ORAL
  Filled 2018-11-20: qty 2

## 2018-11-20 MED ORDER — IPRATROPIUM BROMIDE 0.02 % IN SOLN: 0.5000 mg | Freq: Four times a day (QID) | RESPIRATORY_TRACT | Status: DC | PRN

## 2018-11-20 MED ORDER — DARBEPOETIN ALFA 25 MCG/0.42ML IJ SOSY
25.0000 ug | PREFILLED_SYRINGE | INTRAMUSCULAR | Status: DC
  Filled 2018-11-20: qty 0.42

## 2018-11-20 MED ORDER — MONTELUKAST SODIUM 10 MG PO TABS
10.0000 mg | ORAL_TABLET | Freq: Every day | ORAL | Status: DC
  Administered 2018-11-20 – 2018-11-21 (×2): 10 mg via ORAL
  Filled 2018-11-20 (×2): qty 1

## 2018-11-20 MED ORDER — MIDODRINE HCL 5 MG PO TABS
10.0000 mg | ORAL_TABLET | ORAL | Status: DC
  Administered 2018-11-22: 12:00:00 10 mg via ORAL

## 2018-11-20 MED ORDER — RENA-VITE PO TABS
1.0000 | ORAL_TABLET | Freq: Every day | ORAL | Status: DC
  Administered 2018-11-20 – 2018-11-21 (×2): 1 via ORAL
  Filled 2018-11-20 (×2): qty 1

## 2018-11-20 MED ORDER — CALCITRIOL 0.5 MCG PO CAPS
1.7500 ug | ORAL_CAPSULE | ORAL | Status: DC
  Administered 2018-11-22: 1.75 ug via ORAL

## 2018-11-20 NOTE — Progress Notes (Signed)
PROGRESS NOTE    Joan Mosley  [id number] DOB: 17-Dec-1945 DOA: 11/10/2018 PCP: Flossie Buffy, NP   Brief Narrative:   73 year old lady prior history of pulmonary hypertension, COPD with 6 L of nasal cannula oxygen at home, hyperlipidemia, diabetes mellitus, hypothyroidism, depression, end-stage renal disease on dialysis on Tuesdays Thursdays and Saturdays, recliner bound, morbid obesity presents with generalized weakness and shortness of breath.  On arrival patient was found to be fluid overloaded and hypoxic requiring 100% of nonrebreather. She was weaned off the NRB to 13 to 15  lit of HF oxygen, intermittently requiring BIPAP. Due to her requirement of high flow oxygen despite HD, and IV solumedrol, a CT chest without contrast was done and showed right lower lobe opacity for which she was started on IV vancomycin and IV aztreonam. We were unable to wean her oxygen down and PCCM consulted for further recommendations. Palliative care consulted for goals of care and unable to wean her off the BiPAP/requirement of high flow oxygen.  Family would like to continue with BiPAP for now and possible discharge home with BiPAP.    Assessment & Plan:   Principal Problem:   Acute on chronic respiratory failure with hypoxia and hypercapnia (HCC) Active Problems:   Hyperkalemia   Hypothyroidism   ESRD (end stage renal disease) (HCC)   COPD with acute exacerbation (HCC)   TIA (transient ischemic attack)   HLD (hyperlipidemia)   Type II diabetes mellitus with renal manifestations (HCC)   Depression with anxiety   COPD exacerbation (HCC)   Palliative care encounter   Aspiration pneumonia of right lower lobe (HCC)   Acute on chronic respiratory failure with hypoxia and hypercapnia secondary to hypervolemia and COPD exacerbation and right sided pneumonia.  CTA negative for PE, COVID-19 screening test is negative. Fluid management as per HD. Continue with incentive spirometry, chest  physiotherapy and Lasix. CT chest without contrast showed right  lower lobe pneumonia. She was started on IV vancomycin and IV Azactam.  She continues to require high flow oxygen at 15 L/min and requiring BiPAP.  PCCM consulted for further evaluation.  Recommended DNR as she would not come off the vent if she gets intubated and will she will ultimately require a tracheostomy and long-term ventilation.  Recommend to continue with IV antibiotics and prednisone for COPD.   ESRD on HD IR consulted for exchange of the HD catheter  Back on the schedule on TTS. Fluid management as per hemodialysis and continue with Sensipar and calcitriol.   History of TIA Continue with statin    Hypothyroidism Continue with Synthroid    Hyperkalemia  Resolved   Deconditioning Family would like to take the patient home with BiPAP if possible.  Case manager consulted.    Type 2 diabetes mellitus CBG (last 3)  Recent Labs    11/20/18 0751 11/20/18 1124 11/20/18 1620  GLUCAP 159* 196* 371*   Increase Levemir to 10 units and continue with sliding scale insulin and add 2 units of NovoLog 3 times daily AC.  Depression with anxiety Continue with Klonopin and Prozac.   Hyperlipidemia Continue with Crestor 40 mg daily.   Constipation  Senna   In view of multiple medical problems poor functional status, unable to wean her oxygen down palliative care consulted and her CODE STATUS was changed to DNR.  Further goals of care discussion going on.     DVT prophylaxis: Heparin Code Status: Full code Family Communication: None at bedside  disposition Plan: Pending clinical improvement,  possible d/c when weaned to home oxygen.   Consultants:   Nephrology  IR  Vascular surgery  pccm  Palliative care consult.   Procedures: Exchange of the HD catheter  Antimicrobials: IV vancomycin and IV aztreonam since 10/22  Subjective: Patient appears comfortable denies any chest pain or  shortness of breath on 15 L of high flow oxygen this morning.  Objective: Vitals:   11/20/18 0756 11/20/18 1001 11/20/18 1350 11/20/18 1442  BP:  (!) 103/54 116/62   Pulse:  81 80 80  Resp:  17  16  Temp:   98.4 F (36.9 C)   TempSrc:   Oral   SpO2: 95% 99% 94% 94%  Weight:      Height:        Intake/Output Summary (Last 24 hours) at 11/20/2018 1804 Last data filed at 11/20/2018 0900 Gross per 24 hour  Intake 480 ml  Output -  Net 480 ml   Filed Weights   11/19/18 0436 11/19/18 0819 11/20/18 0420  Weight: 98.8 kg 98.6 kg 98.9 kg    Examination:  General exam: Alert and comfortable on 15 L of high flow oxygen Respiratory system: Diminished air entry throughout, scattered wheezing and tachypnea present Cardiovascular system: S1-S2 heard, regular rate rhythm Gastrointestinal system: Abdomen is soft, nontender, nondistended, bowel sounds are good Central nervous system: Alert and able to answer all questions appropriately Extremities: No cyanosis or clubbing Skin: No rashes seen Psychiatry: Mood is appropriate   Data Reviewed: I have personally reviewed following labs and imaging studies  CBC: Recent Labs  Lab 11/14/18 0718 11/15/18 0500 11/17/18 0728 11/19/18 0842  WBC 9.1 13.0* 15.0* 12.4*  HGB 9.7* 10.0* 10.5* 10.7*  HCT 32.8* 32.5* 35.7* 35.0*  MCV 99.7 98.8 103.2* 98.6  PLT 201 204 146* 0000000   Basic Metabolic Panel: Recent Labs  Lab 11/14/18 0718 11/15/18 0802 11/17/18 0728 11/19/18 0842  NA 136 134* 137 140  K 5.2* 5.1 5.1 4.3  CL 96* 92* 97* 99  CO2 23 22 24 23   GLUCOSE 203* 220* 247* 107*  BUN 69* 92* 63* 66*  CREATININE 6.58* 8.15* 7.20* 6.54*  CALCIUM 8.7* 8.2* 8.8* 8.7*  PHOS 8.5* 9.2* 8.7* 9.1*   GFR: Estimated Creatinine Clearance: 8 mL/min (A) (by C-G formula based on SCr of 6.54 mg/dL (H)). Liver Function Tests: Recent Labs  Lab 11/14/18 0718 11/15/18 0802 11/17/18 0728 11/19/18 0842  ALBUMIN 3.1* 3.2* 2.9* 3.0*   No results  for input(s): LIPASE, AMYLASE in the last 168 hours. No results for input(s): AMMONIA in the last 168 hours. Coagulation Profile: No results for input(s): INR, PROTIME in the last 168 hours. Cardiac Enzymes: No results for input(s): CKTOTAL, CKMB, CKMBINDEX, TROPONINI in the last 168 hours. BNP (last 3 results) No results for input(s): PROBNP in the last 8760 hours. HbA1C: Recent Labs    11/18/18 1120  HGBA1C 5.6   CBG: Recent Labs  Lab 11/19/18 1629 11/19/18 2139 11/20/18 0751 11/20/18 1124 11/20/18 1620  GLUCAP 263* 275* 159* 196* 371*   Lipid Profile: No results for input(s): CHOL, HDL, LDLCALC, TRIG, CHOLHDL, LDLDIRECT in the last 72 hours. Thyroid Function Tests: No results for input(s): TSH, T4TOTAL, FREET4, T3FREE, THYROIDAB in the last 72 hours. Anemia Panel: No results for input(s): VITAMINB12, FOLATE, FERRITIN, TIBC, IRON, RETICCTPCT in the last 72 hours. Sepsis Labs: No results for input(s): PROCALCITON, LATICACIDVEN in the last 168 hours.  Recent Results (from the past 240 hour(s))  SARS CORONAVIRUS 2 (TAT 6-24  HRS) Nasopharyngeal Nasopharyngeal Swab     Status: None   Collection Time: 11/10/18 11:32 PM   Specimen: Nasopharyngeal Swab  Result Value Ref Range Status   SARS Coronavirus 2 NEGATIVE NEGATIVE Final    Comment: (NOTE) SARS-CoV-2 target nucleic acids are NOT DETECTED. The SARS-CoV-2 RNA is generally detectable in upper and lower respiratory specimens during the acute phase of infection. Negative results do not preclude SARS-CoV-2 infection, do not rule out co-infections with other pathogens, and should not be used as the sole basis for treatment or other patient management decisions. Negative results must be combined with clinical observations, patient history, and epidemiological information. The expected result is Negative. Fact Sheet for Patients: SugarRoll.be Fact Sheet for Healthcare Providers:  https://www.woods-mathews.com/ This test is not yet approved or cleared by the [place] FDA and  has been authorized for detection and/or diagnosis of SARS-CoV-2 by FDA under an Emergency Use Authorization (EUA). This EUA will remain  in effect (meaning this test can be used) for the duration of the COVID-19 declaration under Section 56 4(b)(1) of the Act, 21 U.S.C. section 360bbb-3(b)(1), unless the authorization is terminated or revoked sooner. Performed at [facility], [place] [address], [place], [place] [address]   Culture, blood (routine x 2)     Status: None   Collection Time: 11/11/18  4:36 PM   Specimen: BLOOD LEFT ARM  Result Value Ref Range Status   Specimen Description BLOOD LEFT ARM  Final   Special Requests   Final    BOTTLES DRAWN AEROBIC AND ANAEROBIC Blood Culture adequate volume   Culture   Final    NO GROWTH 5 DAYS Performed at [facility], [address], [place], [place] [address]    Report Status 11/16/2018 FINAL  Final  Culture, blood (routine x 2)     Status: None   Collection Time: 11/11/18  4:36 PM   Specimen: BLOOD LEFT ARM  Result Value Ref Range Status   Specimen Description BLOOD LEFT ARM  Final   Special Requests   Final    AEROBIC BOTTLE ONLY Blood Culture results may not be optimal due to an inadequate volume of blood received in culture bottles   Culture   Final    NO GROWTH 5 DAYS Performed at [facility], [place] [address] [place], [place] [address]    Report Status 11/16/2018 FINAL  Final         Radiology Studies: No results found.      Scheduled Meds: . [START ON 11/22/2018] calcitRIOL  1.75 mcg Oral Q T,Th,Sa-HD  . Chlorhexidine Gluconate Cloth  6 each Topical Q0600  . Chlorhexidine Gluconate Cloth  6 each Topical Q0600  . [START ON 11/22/2018] cinacalcet  60 mg Oral Q T,Th,Sa-HD  . [START ON 11/22/2018] darbepoetin (ARANESP) injection - DIALYSIS  25 mcg Intravenous Q Tue-HD  . feeding  supplement (NEPRO CARB STEADY)  237 mL Oral BID BM  . FLUoxetine  10 mg Oral Daily  . furosemide  80 mg Oral Q M,W,F,Su-1800  . insulin aspart  0-5 Units Subcutaneous QHS  . insulin aspart  0-9 Units Subcutaneous TID WC  . insulin detemir  10 Units Subcutaneous Daily  . [START ON 11/21/2018] levothyroxine  112 mcg Oral QAC breakfast  . [START ON 11/22/2018] midodrine  10 mg Oral Q T,Th,Sa-HD  . montelukast  10 mg Oral QHS  . multivitamin  1 tablet Oral QHS  . predniSONE  40 mg Oral QAC  breakfast  . rosuvastatin  40 mg Oral q1800  . sevelamer carbonate  1,600 mg Oral TID WC   Continuous Infusions: . aztreonam 0.5 g (11/20/18 1707)  . [START ON 11/22/2018] vancomycin       LOS: 9 days       Hosie Poisson, MD [facility] Pager [phone]  If 7PM-7AM, please contact night-coverage [email or web address] Password [facility] 11/20/2018, 6:04 PM

## 2018-11-20 NOTE — Progress Notes (Addendum)
Spoke with son [name] on the phone. Spoke with son [name] on the phone. Spoke with patient at bedside along with Altamese Dilling and [name] (son and daughter).  Patient is on high flow today and able to speak with me.  When I entered the room she was telling her son  Altamese Dilling "Please stop telling me I'm dying.  I just can't hear it anymore."  Patient states "I would really like to make it thru the holidays."  She repeats that several times during our conversation.  Per Altamese Dilling, while patient has short term memory impairment she does understand that her lungs are not functioning well any more, her kidneys are not functioning anymore and she is dying.   Per Altamese Dilling, she just wants to make it thru the holidays.  We considered going home on BiPAP - she would have to be able to take hemodialysis with BiPAP on (her own BiPAP), and she would have to be able to be transported to and from HD with enough oxygen (15L currently).  We also discussed [facility].  She has been there recently for a few days.  We would need to determine if she is eligible and would insurance cover the stay.  Reading the room it appeared that family feels most secure sending her to [facility] if possible.   We discussed RLL pna.  She did reasonably well with SLP evaluation this morning.  I don't think a modified barium swallow is necessary at this point.  Per Randell Patient has been having recurrent RLL pneumonia since before moving to [place] a few months ago.  He asked me what caused recurrent RLL pneumonia.  We discussed recurrent aspiration pneumonia as a terminal illness.  PE:  Patient awake, alert, interactive Affect:  Depressed today. (very different from usual) CV: rrr resp: no distress on high flow - but appears exhausted. Abdomen: soft, nt, nd, obese  Discussed HCPOA (as I have never seen the paper work).  Per [name] and Altamese Dilling:   [name] is the primary [place].   [name] is the secondary HCPOA.   Patient lives with [name] and [place].  [name] should always be the 1st  point of contact for all decisions.  Note [name] is a paramedic and very familiar with his mother's health.  Assessment:  Patient appears to be slowly weaning from BiPAP.  I believe she will continue to need significant amounts of BiPAP during the day and at night.  When off BiPAP she requires high flow.  Hopefully this will improve overtime as her RLL pneumonia improves.   Patient has a strong will to live (at least thru the holidays).  Recommendations:  Consulted case management for consideration of LTAC.  As a back up consulted case management for [facility].  If patient goes home - please determine if she will be able to use her [facility] HD (Dr. Justin Mend is over the outpatient centers).  She will also have to have transport to/from HD with 15L of oxygen.  Family may have to provide Transport.  (How would that be accomplished?)  Recommend Oxymizer.  If not going to LTAC would request she receive an oxymizer inpatient to continue to wean down her oxygen requirements.  DNR / Full scope treatment.  Low dose dilaudid PRN for SOB, dyspnea   (She tolerates this well)  Low dose ativan PRN for anxiety (she tolerates this well)  Palliative will continue to follow with you.  I'll be back on Friday, but will ask one of my colleagues  to continue to support the [facility]ley family until I return.  Florentina Jenny, PA-C Palliative Medicine Pager: [phone]  Time 75 minutes Time in:  11:00 Time out:  12:15

## 2018-11-20 NOTE — Evaluation (Signed)
Clinical/Bedside Swallow Evaluation Patient Details  Name: Joan Mosley MRN: [id number] Date of Birth: Mar 09, 1945  Today's Date: 11/20/2018 Time: SLP Start Time (ACUTE ONLY): 1022 SLP Stop Time (ACUTE ONLY): 1030 SLP Time Calculation (min) (ACUTE ONLY): 8 min  Past Medical History:  Past Medical History:  Diagnosis Date  . Arthritis   . COPD (chronic obstructive pulmonary disease) ([place])   . Diabetes mellitus without complication ([place])   . Diabetic neuropathy ([place])   . 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 Renal disorder    ESRD  . Thyroid disease   . TIA (transient ischemic attack)    Past Surgical History:  Past Surgical History:  Procedure Laterality Date  . ABDOMINAL HYSTERECTOMY    . IR FLUORO GUIDE CV LINE RIGHT  11/14/2018  . RECTOCELE REPAIR    . REPLACEMENT TOTAL KNEE BILATERAL Bilateral 2008  . THYROIDECTOMY     80%  . VAGINAL WOUND CLOSURE / REPAIR     HPI:  73 year old lady prior history of pulmonary hypertension, COPD with 6 L of nasal cannula oxygen at home, hyperlipidemia, diabetes mellitus, hypothyroidism, depression, end-stage renal disease on dialysis, recliner bound, morbid obesity presents with generalized weakness and shortness of breath.  On arrival patient was found to be fluid overloaded and hypoxic requiring 100% of nonrebreather. She was weaned off the NRB to 13 to 15  lit of HF oxygen, intermittently requiring BIPAP. Due to her requirement of high flow oxygen despite HD, and IV solumedrol, a CT chest without contrast was done and showed right lower lobe opacity for which she was started on IV vancomycin and IV aztreonam.   Assessment / Plan / Recommendation Clinical Impression  Pt presents with functional swallowing as assessed clinically.  Pt tolerated all consistencies trialed with no clinical s/s of aspiration.  Pt exhibited good oral clearance of solids.  Pt took oral medications with thin liquid wash without any difficulty.  RN reports good  tolerance of POs.  CXR revealed RLL pneumonia.  Pt declined further instrumental assessment of swallowing for possible silent aspiration at this time.  Pt appears safe to continue regular texture diet with thin liquids. Goals of care discussion with family and palliative is pending. SLP will sign off at this time.  Please re-consult ST and place orders for MBSS if agressive treatment is pursued and there is concern for silent aspiration. SLP Visit Diagnosis: Dysphagia, oral phase (R13.11)    Aspiration Risk  No limitations    Diet Recommendation Regular;Thin liquid   Liquid Administration via: Cup;Straw Medication Administration: Whole meds with liquid Supervision: Staff to assist with self feeding(as needed) Compensations: Minimize environmental distractions;Slow rate;Small sips/bites Postural Changes: Seated upright at 90 degrees    Other  Recommendations Oral Care Recommendations: Oral care BID   Follow up Recommendations None      Frequency and Duration   N/A         Prognosis Prognosis for Safe Diet Advancement: (N/A)      Swallow Study   General HPI: 73 year old lady prior history of pulmonary hypertension, COPD with 6 L of nasal cannula oxygen at home, hyperlipidemia, diabetes mellitus, hypothyroidism, depression, end-stage renal disease on dialysis, recliner bound, morbid obesity presents with generalized weakness and shortness of breath.  On arrival patient was found to be fluid overloaded and hypoxic requiring 100% of nonrebreather. She was weaned off the NRB to 13 to 15  lit of HF oxygen, intermittently requiring BIPAP. Due to her requirement of high flow  oxygen despite HD, and IV solumedrol, a CT chest without contrast was done and showed right lower lobe opacity for which she was started on IV vancomycin and IV aztreonam. Type of Study: Bedside Swallow Evaluation Previous Swallow Assessment: None Diet Prior to this Study: Regular Temperature Spikes Noted: No Respiratory  Status: Nasal cannula History of Recent Intubation: No Behavior/Cognition: Alert;Cooperative;Pleasant mood Oral Cavity Assessment: Within Functional Limits Oral Care Completed by SLP: No Oral Cavity - Dentition: Missing dentition Self-Feeding Abilities: Needs assist Patient Positioning: Upright in bed Baseline Vocal Quality: Normal Volitional Cough: (Fair) Volitional Swallow: Able to elicit    Oral/Motor/Sensory Function Overall Oral Motor/Sensory Function: Mild impairment Facial ROM: Reduced right;Reduced left Lingual ROM: Within Functional Limits Lingual Symmetry: Within Functional Limits Lingual Strength: Within Functional Limits Velum: Within Functional Limits Mandible: Within Functional Limits   Ice Chips Ice chips: Not tested   Thin Liquid Thin Liquid: Within functional limits Presentation: Cup;Straw    Nectar Thick Nectar Thick Liquid: Not tested   Honey Thick Honey Thick Liquid: Not tested   Puree Puree: Within functional limits Presentation: Spoon   Solid     Solid: Within functional limits Presentation: (SLP fed)      Celedonio Savage, [place], [place] Office: [phone]; Pager (10/25): [phone] 11/20/2018,11:06 AM

## 2018-11-20 NOTE — Progress Notes (Addendum)
[name] Progress Note   Dialysis Orders: [facility] TTS 4h    99kg    2K/2Ca     P4   Heparin - none    TDC R chest (now replaced 11/14/18, IR) - Calcitriol 1.49mcg PO q HD - Sensipar 60mg  PO q HD - Venofer 100 x 5 ordered - Mircera 100 q 2 weeks  Assessment/Plan: 1. Dyspnea: COPD/ OHS w/ acute on chronic resp failure due to COPD exac +/- pna +/- vol overload. Bipap dependent now it appears. Vol overload mostly resolved. . On steroids and nebs, vanc/ aztreonam. Will cont to lower dry wt w/ HD as tolerated.  2. DNR/Goals of Care -  palliative care to meet with family again today. Very appreciative of their assistance in navigating this process. Difficult process.  She has been unhappy on dialysis since arrival in [place] with progressive decline in functional status and no insight (or not willing to accept insight) into this. 3. TDC dysfunction: replaced by IR on 10/19 due to malfunction. 4. ESRD: TTS - next HD Tuesday 5. Hypertension/volume- as above. Gets midodrine before HD and mid run - net UF 854 cc Saturday -calculated post wt would be about 97.8  6. Anemia:Hgb 10.7  On  low dose Aranesp weekly 7. Metabolic bone disease: Continue home binders/VDRA/sensipar. P remains high - not really an issue at this point 8. Hypothyroidism: Continue synthroid.  7. T2DM   8. Nutrition- Renal/Carb mod diet orderedRenal vits, nepro  [place] 11/20/2018,7:50 AM  LOS: 9 days    I have seen and examined this patient and agree with the plan of care. Last HD 10/24; plan next HD Tuesday. No acute indication today.  Joan Melena, MD 11/20/2018, 10:38 AM  Subjective:   Having breathing treatment. "I hate dialysis." offered that she could stop dialysis, but then she said I would die and I want to keep on living.  Objective Vitals:   11/19/18 1831 11/19/18 2013 11/19/18 2135 11/20/18 0420  BP:  (!) 102/47  131/73  Pulse:  90 82  86  Resp: (!) 24 (!) 23  17  Temp:  98 F (36.7 C)  98 F (36.7 C)  TempSrc:  Oral  Oral  SpO2: 95% 97% 98% 90%  Weight:    98.9 kg  Height:       Physical Exam General: chronically ill female.  Heart: RRR Lungs: some exp wheezes - Abdomen: soft NT obese Extremities:tr - 1 + LE edema LE tender to touch Dialysis Access:  Right IJ [facility] exit ok    Additional Objective Labs: Basic Metabolic Panel: Recent Labs  Lab 11/15/18 0802 11/17/18 0728 11/19/18 0842  NA 134* 137 140  K 5.1 5.1 4.3  CL 92* 97* 99  CO2 22 24 23   GLUCOSE 220* 247* 107*  BUN 92* 63* 66*  CREATININE 8.15* 7.20* 6.54*  CALCIUM 8.2* 8.8* 8.7*  PHOS 9.2* 8.7* 9.1*   Liver Function Tests: Recent Labs  Lab 11/15/18 0802 11/17/18 0728 11/19/18 0842  ALBUMIN 3.2* 2.9* 3.0*   No results for input(s): LIPASE, AMYLASE in the last 168 hours. CBC: Recent Labs  Lab 11/14/18 0718 11/15/18 0500 11/17/18 0728 11/19/18 0842  WBC 9.1 13.0* 15.0* 12.4*  HGB 9.7* 10.0* 10.5* 10.7*  HCT 32.8* 32.5* 35.7* 35.0*  MCV 99.7 98.8 103.2* 98.6  PLT 201 204 146* 176   No results for input(s): CKTOTAL, CKMB, CKMBINDEX, TROPONINI in the last 168 hours.  CBG: Recent Labs  Lab 11/18/18 2123 11/19/18 0745 11/19/18 1312 11/19/18 1629 11/19/18 2139  GLUCAP 138* 107* 114* 263* 275*  Studies/Results: Dg Chest Port 1 View  Result Date: 11/18/2018 CLINICAL DATA:  Hypoxia. EXAM: PORTABLE CHEST 1 VIEW COMPARISON:  Chest x-ray dated November 16, 2018. FINDINGS: The patient is rotated to the right. Unchanged tunneled right internal jugular dialysis catheter. Stable cardiomediastinal silhouette. Unchanged consolidation in the right lower lobe and collapse of the right middle lobe with elevation of the right hemidiaphragm. Diffuse fine granular densities throughout both lungs are unchanged. No pneumothorax. No acute osseous abnormality. IMPRESSION: 1. Unchanged right lower lobe pneumonia and right middle lobe collapse.  Electronically Signed   By: Titus Dubin M.D.   On: 11/18/2018 12:08   Medications: . aztreonam 0.5 g (11/20/18 0343)  . vancomycin 1,000 mg (11/19/18 1106)   . calcitRIOL  1.75 mcg Oral Once per day on Tue Thu Sat  . Chlorhexidine Gluconate Cloth  6 each Topical Q0600  . Chlorhexidine Gluconate Cloth  6 each Topical Q0600  . cinacalcet  60 mg Oral Once per day on Tue Thu Sat  . darbepoetin (ARANESP) injection - DIALYSIS  25 mcg Intravenous Q Tue-HD  . feeding supplement (NEPRO CARB STEADY)  237 mL Oral BID BM  . FLUoxetine  10 mg Oral Daily  . furosemide  80 mg Oral Once per day on Sun Mon Wed Fri  . heparin  5,000 Units Subcutaneous Q8H  .  HYDROmorphone (DILAUDID) injection  0.25 mg Intravenous Q8H  . insulin aspart  0-15 Units Subcutaneous TID WC  . insulin aspart  0-5 Units Subcutaneous QHS  . insulin detemir  10 Units Subcutaneous Daily  . ipratropium  0.5 mg Inhalation TID  . levalbuterol  0.63 mg Nebulization TID  . levothyroxine  112 mcg Oral Q0600  . LORazepam  0.5 mg Intravenous Q6H  . methylPREDNISolone (SOLU-MEDROL) injection  60 mg Intravenous Daily  . montelukast  10 mg Oral QHS  . multivitamin  1 tablet Oral QHS  . rosuvastatin  40 mg Oral Daily  . senna-docusate  1 tablet Oral QHS  . sevelamer carbonate  1,600 mg Oral TID WC

## 2018-11-20 NOTE — Progress Notes (Signed)
Received message to f/u with consult about LTAC option. Spoke with liaison at [facility] where patient has been recently. They are not able to run her benefits until tomorrow. Stated that they do have one HD bed available. Advised liaison that patient is slow to wean from bipap.   Alternative options discussed for possible trilogy at home.   Family meeting pending this afternoon.   TOC following for transition of care needs.   Manya Silvas, MSN RN CCM Transitions of Care [phone]

## 2018-11-20 NOTE — Progress Notes (Deleted)
Patient seen and examined.  Dr. Tyrell Antonio seeing her at the same time.  Ms. [place] appears comfortable.  Her eyes are open but she is not seeing.  Heart rate is not rapid.  No respiratory distress.  Skin warm to touch.  Swelling in left arm appears decreased and hand is warm today.  As I stay in the room a few more minutes with her occasionally she stirs and appears a bit agitated particularly when touched or spoken too.  Assessment:  ESRD refused HD.  Actively dying.  No urine in purewick container. .  Recommendation:   Full comfort care Awaiting [facility] bed. Will schedule very low dose ativan. Will update daughter.  Florentina Jenny, PA-C Palliative Medicine Pager: [phone]  15 min.

## 2018-11-20 NOTE — Progress Notes (Signed)
Pharmacy Antibiotic Note  Joan Mosley is a 73 y.o. female admitted on 11/10/2018 with SOB and now with concern for PNA based on most recent CT findings. Pharmacy has been consulted for Vancomycin + Azactam dosing.  The patient is ESRD HD-TTS, last HD session 10/24. Today is day 4 of antibiotics.  Plan: Continue vancomycin 1g/HD-TTS Continue azactam 500 mg IV every 12 hours  Will continue to follow HD schedule/duration, LOT, and antibiotic de-escalation plans according to palliative recs/GOC. Family meeting 10/25.  Height: 5\' 1"  (154.9 cm) Weight: 218 lb 0.6 oz (98.9 kg) IBW/kg (Calculated) : 47.8  Temp (24hrs), Avg:97.8 F (36.6 C), Min:97.3 F (36.3 C), Max:98 F (36.7 C)  Recent Labs  Lab 11/14/18 0718 11/15/18 0500 11/15/18 0802 11/17/18 0728 11/19/18 0842  WBC 9.1 13.0*  --  15.0* 12.4*  CREATININE 6.58*  --  8.15* 7.20* 6.54*    Estimated Creatinine Clearance: 8 mL/min (A) (by C-G formula based on SCr of 6.54 mg/dL (H)).    Allergies  Allergen Reactions  . Avelox [Moxifloxacin Hcl In Nacl] Other (See Comments)    Unknown reaction  . Codeine Anaphylaxis  . Penicillins Rash    Did it involve swelling of the face/tongue/throat, SOB, or low BP? Unknown Did it involve sudden or severe rash/hives, skin peeling, or any reaction on the inside of your mouth or nose? Yes Did you need to seek medical attention at a hospital or doctor's office? Unknown When did it last happen?unknown If all above answers are "NO", may proceed with cephalosporin use.   . Sulfa Antibiotics Other (See Comments)    Unknown reaction  . Dextromethorphan-Guaifenesin Other (See Comments)    Reported by [facility] - unknown reaction  . Shellfish Allergy Hives, Itching and Rash    Antimicrobials this admission: Vanc 10/22 >> Cefepime 10/22 >>  Dose adjustments this admission:  Microbiology results: 10/15 COVID >> neg 10/16 BCx >> NGf     Thank you for allowing pharmacy to  participate in this patient's care.  Shalisa Mcquade L. Devin Going, [place] PGY1 Pharmacy Resident 11/20/18    8:16 AM  Please check AMION for all [place] phone numbers After 10:00 PM, call the [place] [phone]

## 2018-11-21 ENCOUNTER — Inpatient Hospital Stay (HOSPITAL_COMMUNITY): Payer: Medicare Other

## 2018-11-21 ENCOUNTER — Telehealth: Payer: Self-pay | Admitting: Nurse Practitioner

## 2018-11-21 LAB — GLUCOSE, CAPILLARY
Glucose-Capillary: 117 mg/dL — ABNORMAL HIGH (ref 70–99)
Glucose-Capillary: 214 mg/dL — ABNORMAL HIGH (ref 70–99)
Glucose-Capillary: 328 mg/dL — ABNORMAL HIGH (ref 70–99)
Glucose-Capillary: 174 mg/dL — ABNORMAL HIGH (ref 70–99)

## 2018-11-21 MED ORDER — INSULIN DETEMIR 100 UNIT/ML ~~LOC~~ SOLN: 10.0000 [IU] | Freq: Every day | SUBCUTANEOUS | 11 refills | Status: DC

## 2018-11-21 MED ORDER — BENZONATATE 200 MG PO CAPS: 200.0000 mg | ORAL_CAPSULE | Freq: Three times a day (TID) | ORAL | 0 refills | Status: DC | PRN

## 2018-11-21 MED ORDER — DEXTROSE 5 % IV SOLN: 0.5000 g | Freq: Two times a day (BID) | INTRAVENOUS | Status: DC

## 2018-11-21 MED ORDER — CHLORHEXIDINE GLUCONATE CLOTH 2 % EX PADS
6.0000 | MEDICATED_PAD | Freq: Every day | CUTANEOUS | Status: DC
  Administered 2018-11-22: 06:00:00 6 via TOPICAL

## 2018-11-21 MED ORDER — VANCOMYCIN HCL IN DEXTROSE 1-5 GM/200ML-% IV SOLN: 1000.0000 mg | INTRAVENOUS | Status: DC

## 2018-11-21 MED ORDER — BENZONATATE 100 MG PO CAPS
200.0000 mg | ORAL_CAPSULE | Freq: Three times a day (TID) | ORAL | Status: DC | PRN
  Administered 2018-11-21 (×2): 200 mg via ORAL
  Filled 2018-11-21 (×2): qty 2

## 2018-11-21 MED ORDER — SALINE SPRAY 0.65 % NA SOLN
1.0000 | NASAL | Status: DC | PRN
  Filled 2018-11-21: qty 44

## 2018-11-21 MED ORDER — CINACALCET HCL 30 MG PO TABS: 60.0000 mg | ORAL_TABLET | ORAL | Status: DC

## 2018-11-21 MED ORDER — PREDNISONE 20 MG PO TABS: 40.0000 mg | ORAL_TABLET | Freq: Every day | ORAL | Status: DC

## 2018-11-21 MED ORDER — CALCITRIOL 0.25 MCG PO CAPS: 1.7500 ug | ORAL_CAPSULE | ORAL | Status: DC

## 2018-11-21 MED ORDER — DARBEPOETIN ALFA 25 MCG/0.42ML IJ SOSY: 25.0000 ug | PREFILLED_SYRINGE | INTRAMUSCULAR | Status: AC

## 2018-11-21 MED ORDER — INSULIN ASPART 100 UNIT/ML ~~LOC~~ SOLN
2.0000 [IU] | Freq: Three times a day (TID) | SUBCUTANEOUS | Status: DC
  Administered 2018-11-21 – 2018-11-22 (×2): 2 [IU] via SUBCUTANEOUS

## 2018-11-21 MED ORDER — IPRATROPIUM-ALBUTEROL 0.5-2.5 (3) MG/3ML IN SOLN
RESPIRATORY_TRACT | Status: AC
  Administered 2018-11-21: 3 mL via RESPIRATORY_TRACT
  Filled 2018-11-21: qty 3

## 2018-11-21 MED ORDER — SEVELAMER CARBONATE 800 MG PO TABS: 1600.0000 mg | ORAL_TABLET | Freq: Three times a day (TID) | ORAL | Status: DC

## 2018-11-21 MED ORDER — RENA-VITE PO TABS: 1.0000 | ORAL_TABLET | Freq: Every day | ORAL | 0 refills | Status: DC

## 2018-11-21 MED ORDER — IPRATROPIUM-ALBUTEROL 0.5-2.5 (3) MG/3ML IN SOLN
3.0000 mL | RESPIRATORY_TRACT | Status: DC
  Administered 2018-11-21 – 2018-11-22 (×5): 3 mL via RESPIRATORY_TRACT
  Filled 2018-11-21 (×3): qty 3

## 2018-11-21 MED ORDER — NEPRO/CARBSTEADY PO LIQD: 237.0000 mL | Freq: Two times a day (BID) | ORAL | 0 refills | Status: DC

## 2018-11-21 NOTE — Progress Notes (Signed)
Inpatient Diabetes Program Recommendations  AACE/ADA: New Consensus Statement on Inpatient Glycemic Control (2015)  Target Ranges:  Prepandial:   less than 140 mg/dL      Peak postprandial:   less than 180 mg/dL (1-2 hours)      Critically ill patients:  140 - 180 mg/dL   Lab Results  Component Value Date   GLUCAP 214 (H) 11/21/2018   HGBA1C 5.6 11/18/2018    Review of Glycemic Control Results for Joan Mosley, Joan Mosley (MRN [id number]) as of 11/21/2018 12:17  Ref. Range 11/20/2018 21:18 11/21/2018 07:32 11/21/2018 11:30  Glucose-Capillary Latest Ref Range: 70 - 99 mg/dL 230 (H) 117 (H) 214 (H)   Diabetes history: Type 2 DM Outpatient Diabetes medications: Levemir 7 units QD, Humalog 1-5 units TID Current orders for Inpatient glycemic control: Levemir 10 units QD, Novolog 0-9 units TID, Novolog 0-5 units QHS Prednisone 40 mg QAM  Inpatient Diabetes Program Recommendations:    Consider adding Novolog 3 units TID (asusming patient is consuming >50% of meal) in the setting of steroids.   Thanks, Bronson Curb, MSN, RNC-OB Diabetes Coordinator [phone] (8a-5p)

## 2018-11-21 NOTE — Progress Notes (Deleted)
RN called [facility] to determine when pt will be picked up. Tara at [facility] stated she was not on the list to be transported.  Baxter Flattery stated she can not be transported on HFNC and must be on non-rebreather. She stated to call her back when patient was able to tolerate.   I have notified Christy, RN and made aware of situation and given report to [place], Therapist, sports

## 2018-11-21 NOTE — Progress Notes (Signed)
Pt O2 sats dropping into 70's after RN educated to deep breath with HFNC 15L. RN contacted RT and placed pt on BiPAP. Will continue to monitor closely.

## 2018-11-21 NOTE — Telephone Encounter (Signed)
[name] please advise.   Copied from [place] [phone]. Topic: General - Inquiry >> Nov 21, 2018  3:40 PM Rutherford Nail, [place] wrote: Reason for CRM: Patient's son calling and states that he is needing a letter from Wilfred Lacy stating that he takes care of his mother 24/7. States that this letter is needed to show to the foods stamps office. Please advise. CB#: [phone]

## 2018-11-21 NOTE — TOC Progression Note (Signed)
Transition of Care [facility]) - Progression Note    Patient Details  Name: Joan Mosley MRN: [id number] Date of Birth: 1945/06/17  Transition of Care [facility]) CM/SW [place], Eva Phone Number: 11/21/2018, 5:06 PM  Clinical Narrative:     CSW received a phone call from [place]. She stated that the patient would need to have dialysis before she was able to come. She stated that the nurse reported that she had not dialyzed today. [facility] has a rule about having a patient dialyze three times a week and they only dialyze their patients on Monday, Wednesday, and Friday. Lorene stated that the patient can come on Tuesday.   CSW called and cancelled transport with [facility].   CSW will continue to follow.   Expected Discharge Plan: Long Term Acute Care (LTAC) Barriers to Discharge: Continued Medical Work up  Expected Discharge Plan and Services Expected Discharge Plan: [place] (LTAC) In-house Referral: NA   Post Acute Care Choice: Long Term Acute Care (LTAC) Living arrangements for the past 2 months: Single Family Home Expected Discharge Date: 11/21/18                                     Social Determinants of Health (SDOH) Interventions    Readmission Risk Interventions Readmission Risk Prevention Plan 10/04/2018  Transportation Screening Complete  Medication Review Press photographer) Complete  PCP or Specialist appointment within 3-5 days of discharge Complete  HRI or [place] Complete  SW Recovery Care/Counseling Consult Complete  [place] Patient Refused

## 2018-11-21 NOTE — Progress Notes (Signed)
Palliative Medicine RN Note: Both sons [name] and [name]) called independently asking about d/c plan/timing. I notified Shelton Silvas, the [facility] SW for Mrs Grauberger, and asked that she update [name], as he is POA.  Marjie Skiff Mahlani Berninger, RN, BSN, [place] Medicine Team 11/21/2018 1:22 PM Office [phone]

## 2018-11-21 NOTE — TOC Initial Note (Signed)
Transition of Care [facility]) - Initial/Assessment Note    Patient Details  Name: Joan Mosley MRN: [id number] Date of Birth: 1945-12-02  Transition of Care [facility]) CM/SW Contact:    Eileen Stanford, LCSW Phone Number: 11/21/2018, 11:43 AM  Clinical Narrative:   CSW has followed up with [facility]. They have a HD pt who is scheduled to d/c today. CSW awaiting to hear back.         Expected Discharge Plan: Long Term Acute Care (LTAC) Barriers to Discharge: Continued Medical Work up   Patient Goals and CMS Choice        Expected Discharge Plan and Services Expected Discharge Plan: Long Term Acute Care (LTAC) In-house Referral: NA   Post Acute Care Choice: Long Term Acute Care (LTAC) Living arrangements for the past 2 months: Single Family Home                                      Prior Living Arrangements/Services Living arrangements for the past 2 months: Single Family Home Lives with:: Adult Children Patient language and need for interpreter reviewed:: Yes        Need for Family Participation in Patient Care: Yes (Comment) Care giver support system in place?: Yes (comment)   Criminal Activity/Legal Involvement Pertinent to Current Situation/Hospitalization: No - Comment as needed  Activities of Daily Living      Permission Sought/Granted      Share Information with NAME: [name]  Permission granted to share info w AGENCY: [facility]  Permission granted to share info w Relationship: son  Permission granted to share info w Contact Information: [phone]  Emotional Assessment Appearance:: Appears stated age Attitude/Demeanor/Rapport: Unable to Assess Affect (typically observed): Unable to Assess Orientation: : Oriented to Self, Oriented to Place, Oriented to  Time, Oriented to Situation Alcohol / Substance Use: Not Applicable Psych Involvement: No (comment)  Admission diagnosis:  Problem with dialysis access, initial encounter [facility])  [T82.898A] Acute on chronic respiratory failure with hypoxia and hypercapnia (HCC) [J96.21, J96.22] COPD exacerbation ([place]) [J44.1] Patient Active Problem List   Diagnosis Date Noted  . Aspiration pneumonia of right lower lobe ([place])   . Palliative care encounter   . COPD exacerbation ([place]) 11/11/2018  . TIA (transient ischemic attack)   . HLD (hyperlipidemia)   . Type II diabetes mellitus with renal manifestations ([place])   . Depression with anxiety   . COPD with acute exacerbation ([place]) 09/30/2018  . Acute respiratory failure with hypoxia ([place]) 09/24/2018  . Advanced care planning/counseling discussion   . Goals of care, counseling/discussion   . Palliative care by specialist   . Acute on chronic respiratory failure with hypoxia and hypercapnia ([place]) 09/15/2018  . Pulmonary edema 09/08/2018  . Respiratory failure ([place]) 09/08/2018  . Weakness   . SDH (subdural hematoma) ([place]) 09/06/2018  . Chronic respiratory failure with hypoxia, on home O2 therapy ([place]) 09/06/2018  . Subdural hematoma ([place]) 08/22/2018  . Elevated troponin 08/22/2018  . ESRD (end stage renal disease) ([place]) 08/22/2018  . Anxiety 07/04/2018  . Counseling regarding advanced directives and goals of care 07/01/2018  . Full code status 07/01/2018  . Drug-induced constipation 07/01/2018  . Hemodialysis-associated hypotension 07/01/2018  . Dialysis patient ([place]) 07/01/2018  . HOH (hard of hearing) 07/01/2018  . Stage 5 chronic kidney disease on chronic dialysis ([place]) 07/01/2018  . Type 2 diabetes mellitus with diabetic polyneuropathy, with long-term current use  of insulin ([place]) 07/01/2018  . Hypothyroidism 07/01/2018  . Gait instability 07/01/2018  . Hyperkalemia 04/20/2018   PCP:  Flossie Buffy, NP Pharmacy:   [facility] [place], [place] - [address] [place] [address] Phone: [phone] Fax: [phone]  [place]), [place] - [id number] [place] ([place]) [place] [address] Phone: [phone] Fax: [phone]     Social Determinants of Health (SDOH) Interventions    Readmission Risk Interventions Readmission Risk Prevention Plan 10/04/2018  Transportation Screening Complete  Medication Review (RN Care Manager) Complete  PCP or Specialist appointment within 3-5 days of discharge Complete  HRI or [place] Complete  SW Recovery Care/Counseling Consult Complete  Palliative Care Screening Complete  [place] Patient Refused

## 2018-11-21 NOTE — TOC Progression Note (Addendum)
Transition of Care [facility]) - Progression Note    Patient Details  Name: Joan Mosley MRN: [id number] Date of Birth: 1945/02/17  Transition of Care [facility]) CM/SW [place], LCSW Phone Number: 11/21/2018, 4:29 PM  Clinical Narrative:  Pt has a bed at [facility] today. Pt's son  [name] has been updated. MD has been notified.   RN to call report to [phone], or [phone]. Pt will go to room 317. Accepting MD is Dr. Manson Allan. RN please print d/c summary and send with pt. Pt will be transported via [facility]. Transport is set for 6:00. DNR will need to be signed prior to d/c. If there are any issues please call Lorene with [facility] at [phone].    Expected Discharge Plan: Long Term Acute Care (LTAC) Barriers to Discharge: Continued Medical Work up  Expected Discharge Plan and Services Expected Discharge Plan: [place] (LTAC) In-house Referral: NA   Post Acute Care Choice: Long Term Acute Care (LTAC) Living arrangements for the past 2 months: Single Family Home                                       Social Determinants of Health (SDOH) Interventions    Readmission Risk Interventions Readmission Risk Prevention Plan 10/04/2018  Transportation Screening Complete  Medication Review Press photographer) Complete  PCP or Specialist appointment within 3-5 days of discharge Complete  HRI or [place] Complete  SW Recovery Care/Counseling Consult Complete  [place] Patient Refused

## 2018-11-21 NOTE — Progress Notes (Signed)
PROGRESS NOTE    Joan Mosley  [id number] DOB: [birth year] DOA: 11/10/2018 PCP: Flossie Buffy, NP   Brief Narrative:   73 year old lady prior history of pulmonary hypertension, COPD with 6 L of nasal cannula oxygen at home, hyperlipidemia, diabetes mellitus, hypothyroidism, depression, end-stage renal disease on dialysis on Tuesdays Thursdays and Saturdays, recliner bound, morbid obesity presents with generalized weakness and shortness of breath.  On arrival patient was found to be fluid overloaded and hypoxic requiring 100% of nonrebreather. She was weaned off the NRB to 13 to 15  lit of HF oxygen, intermittently requiring BIPAP. Due to her requirement of high flow oxygen despite HD, and IV solumedrol, a CT chest without contrast was done and showed right lower lobe opacity for which she was started on IV vancomycin and IV aztreonam. We were unable to wean her oxygen down and PCCM consulted for further recommendations. Palliative care consulted for goals of care as we are unable to wean her off the BiPAP/requirement of high flow oxygen.  Family would like to continue with BiPAP and HD. We are looking at [facility] . Discussed the above with the patient's POA ryan.      Assessment & Plan:   Principal Problem:   Acute on chronic respiratory failure with hypoxia and hypercapnia (HCC) Active Problems:   Hyperkalemia   Hypothyroidism   ESRD (end stage renal disease) (HCC)   COPD with acute exacerbation (HCC)   TIA (transient ischemic attack)   HLD (hyperlipidemia)   Type II diabetes mellitus with renal manifestations (HCC)   Depression with anxiety   COPD exacerbation (HCC)   Palliative care encounter   Aspiration pneumonia of right lower lobe (HCC)   Acute on chronic respiratory failure with hypoxia and hypercapnia secondary to hypervolemia and COPD exacerbation and right sided pneumonia.  CTA negative for PE, COVID-19 screening test is negative. Fluid management as  per HD. Continue with incentive spirometry, chest physiotherapy and Lasix. CT chest without contrast showed right  lower lobe pneumonia. She was started on IV vancomycin and IV Azactam.  She continues to require high flow oxygen at 15 L/min and requiring BiPAP.  PCCM consulted for further evaluation.  Recommended DNR as she would not come off the vent if she gets intubated and will she will ultimately require a tracheostomy and long-term ventilation.  Recommend to continue with IV antibiotics and prednisone for COPD.  Repeat chest x-ray for follow-up.  Patient seen and examined this morning and she continues to require 15 L of high flow oxygen today and used BiPAP overnight   ESRD on HD IR consulted for exchange of the HD catheter  Back on the schedule on TTS. Fluid management as per hemodialysis and continue with Sensipar and calcitriol.   History of TIA Continue with statin    Hypothyroidism Continue with Synthroid.    Hyperkalemia  Resolved   Deconditioning PT eval recommending SNF.  Family would like her to go to [facility] before bed is available.  Clinical Education officer, museum on board.    Type 2 diabetes mellitus CBG (last 3)  Recent Labs    11/20/18 2118 11/21/18 0732 11/21/18 1130  GLUCAP 230* 117* 214*   Continue with Levemir 10 units and added 2 units of NovoLog 3 times daily AC  Depression with anxiety With the Klonopin and Prozac  Hyperlipidemia Continue with Crestor   Constipation  Senna  Essential hypertension Blood pressure parameters suboptimally controlled As needed hydralazine will be added.  In view of  multiple medical problems poor functional status, unable to wean her oxygen down palliative care consulted and her CODE STATUS was changed to DNR.  Further goals of care discussion going on.     DVT prophylaxis: Heparin Code Status: Full code Family Communication: None at bedside, discussed with [name] over the phone  disposition Plan: Pending  clinical improvement  Consultants:   Nephrology  IR  Vascular surgery  pccm  Palliative care consult.   Procedures: Exchange of the HD catheter  Antimicrobials: IV vancomycin and IV aztreonam since 10/22  Subjective: Patient looks comfortable still on 15 L of high flow oxygen tachypneic.  He denies any chest pain or nausea vomiting abdominal pain Objective: Vitals:   11/20/18 2350 11/21/18 0424 11/21/18 0856 11/21/18 1134  BP: 126/70 (!) 167/82 133/89 (!) 167/90  Pulse: 68 80 78 88  Resp: (!) 21 (!) 21 20 (!) 23  Temp:  98 F (36.7 C)  98 F (36.7 C)  TempSrc:  Oral  Oral  SpO2: 95% 91% 94% 92%  Weight:  100.5 kg    Height:        Intake/Output Summary (Last 24 hours) at 11/21/2018 1440 Last data filed at 11/21/2018 0900 Gross per 24 hour  Intake 600 ml  Output -  Net 600 ml   Filed Weights   11/19/18 0819 11/20/18 0420 11/21/18 0424  Weight: 98.6 kg 98.9 kg 100.5 kg    Examination:  General exam: Alert, tachypneic on 15 L of high flow nasal cannula oxygen.  Not in severe distress.  Respiratory system: Air entry fair, no wheezing or rhonchi, tachypnea present Cardiovascular system: S1-S2 heard, regular rate rhythm Gastrointestinal system: Abdomen is soft, nontender, nondistended, bowel sounds normal Central nervous system: Alert and oriented and able to answer all questions appropriately Extremities: No cyanosis or clubbing Skin: No rashes Psychiatry: Mood is appropriate   Data Reviewed: I have personally reviewed following labs and imaging studies  CBC: Recent Labs  Lab 11/15/18 0500 11/17/18 0728 11/19/18 0842  WBC 13.0* 15.0* 12.4*  HGB 10.0* 10.5* 10.7*  HCT 32.5* 35.7* 35.0*  MCV 98.8 103.2* 98.6  PLT 204 146* 0000000   Basic Metabolic Panel: Recent Labs  Lab 11/15/18 0802 11/17/18 0728 11/19/18 0842  NA 134* 137 140  K 5.1 5.1 4.3  CL 92* 97* 99  CO2 22 24 23   GLUCOSE 220* 247* 107*  BUN 92* 63* 66*  CREATININE 8.15* 7.20* 6.54*   CALCIUM 8.2* 8.8* 8.7*  PHOS 9.2* 8.7* 9.1*   GFR: Estimated Creatinine Clearance: 8.1 mL/min (A) (by C-G formula based on SCr of 6.54 mg/dL (H)). Liver Function Tests: Recent Labs  Lab 11/15/18 0802 11/17/18 0728 11/19/18 0842  ALBUMIN 3.2* 2.9* 3.0*   No results for input(s): LIPASE, AMYLASE in the last 168 hours. No results for input(s): AMMONIA in the last 168 hours. Coagulation Profile: No results for input(s): INR, PROTIME in the last 168 hours. Cardiac Enzymes: No results for input(s): CKTOTAL, CKMB, CKMBINDEX, TROPONINI in the last 168 hours. BNP (last 3 results) No results for input(s): PROBNP in the last 8760 hours. HbA1C: No results for input(s): HGBA1C in the last 72 hours. CBG: Recent Labs  Lab 11/20/18 1124 11/20/18 1620 11/20/18 2118 11/21/18 0732 11/21/18 1130  GLUCAP 196* 371* 230* 117* 214*   Lipid Profile: No results for input(s): CHOL, HDL, LDLCALC, TRIG, CHOLHDL, LDLDIRECT in the last 72 hours. Thyroid Function Tests: No results for input(s): TSH, T4TOTAL, FREET4, T3FREE, THYROIDAB in the last 72  hours. Anemia Panel: No results for input(s): VITAMINB12, FOLATE, FERRITIN, TIBC, IRON, RETICCTPCT in the last 72 hours. Sepsis Labs: No results for input(s): PROCALCITON, LATICACIDVEN in the last 168 hours.  Recent Results (from the past 240 hour(s))  Culture, blood (routine x 2)     Status: None   Collection Time: 11/11/18  4:36 PM   Specimen: BLOOD LEFT ARM  Result Value Ref Range Status   Specimen Description BLOOD LEFT ARM  Final   Special Requests   Final    BOTTLES DRAWN AEROBIC AND ANAEROBIC Blood Culture adequate volume   Culture   Final    NO GROWTH 5 DAYS Performed at [facility], [address] [place], [place] [address]    Report Status 11/16/2018 FINAL  Final  Culture, blood (routine x 2)     Status: None   Collection Time: 11/11/18  4:36 PM   Specimen: BLOOD LEFT ARM  Result Value Ref Range Status   Specimen Description  BLOOD LEFT ARM  Final   Special Requests   Final    AEROBIC BOTTLE ONLY Blood Culture results may not be optimal due to an inadequate volume of blood received in culture bottles   Culture   Final    NO GROWTH 5 DAYS Performed at [facility], [address], [place], [place] [address]    Report Status 11/16/2018 FINAL  Final         Radiology Studies: No results found.      Scheduled Meds: . [START ON 11/22/2018] calcitRIOL  1.75 mcg Oral Q T,Th,Sa-HD  . Chlorhexidine Gluconate Cloth  6 each Topical Q0600  . Chlorhexidine Gluconate Cloth  6 each Topical Q0600  . Chlorhexidine Gluconate Cloth  6 each Topical Q0600  . [START ON 11/22/2018] cinacalcet  60 mg Oral Q T,Th,Sa-HD  . [START ON 11/22/2018] darbepoetin (ARANESP) injection - DIALYSIS  25 mcg Intravenous Q Tue-HD  . feeding supplement (NEPRO CARB STEADY)  237 mL Oral BID BM  . FLUoxetine  10 mg Oral Daily  . furosemide  80 mg Oral Q M,W,F,Su-1800  . insulin aspart  0-5 Units Subcutaneous QHS  . insulin aspart  0-9 Units Subcutaneous TID WC  . insulin aspart  2 Units Subcutaneous TID WC  . insulin detemir  10 Units Subcutaneous Daily  . levothyroxine  112 mcg Oral QAC breakfast  . [START ON 11/22/2018] midodrine  10 mg Oral Q T,Th,Sa-HD  . montelukast  10 mg Oral QHS  . multivitamin  1 tablet Oral QHS  . predniSONE  40 mg Oral QAC breakfast  . rosuvastatin  40 mg Oral q1800  . sevelamer carbonate  1,600 mg Oral TID WC   Continuous Infusions: . aztreonam 0.5 g (11/21/18 0523)  . [START ON 11/22/2018] vancomycin       LOS: 10 days       Hosie Poisson, MD [facility] Pager [phone]  If 7PM-7AM, please contact night-coverage [email or web address] Password [email or web address] 11/21/2018, 2:40 PM

## 2018-11-21 NOTE — Care Management Important Message (Signed)
Important Message  Patient Details  Name: Joan Mosley MRN: [id number] Date of Birth: 1945-07-14   Medicare Important Message Given:  Yes     Shelda Altes 11/21/2018, 2:04 PM

## 2018-11-21 NOTE — Progress Notes (Signed)
No output measured from purwick. Bladder scan showed 73ml. Upon reassessment, bed was soaked. Full linen changed and changed purwick to properly measure output. Will continue to monitor.

## 2018-11-21 NOTE — Progress Notes (Signed)
Report given to Clifton Custard at [facility].

## 2018-11-21 NOTE — Progress Notes (Addendum)
Palliative Medicine RN Note: Rec'd another call from pt's son Kristie Cowman (NOT the primary contact per Haynes Dage Dellinger's family discussion this weekend). He is requesting we refer Mrs Siegmund to [facility] pulmonary and primary physicians at discharge.  Marjie Skiff Howard Bunte, RN, BSN, [facility] Palliative Medicine Team 11/21/2018 2:16 PM Office [phone]

## 2018-11-21 NOTE — Progress Notes (Signed)
[facility] Progress Note   Dialysis Orders: [facility] T[facility] 4h    99kg    2K/2Ca     P4   Heparin - none    TDC R chest (now replaced 11/14/18, IR) - Calcitriol 1.69mcg PO q HD - Sensipar 60mg  PO q HD - Venofer 100 x 5 ordered - Mircera 100 q 2 weeks  Assessment/Plan: 1. Dyspnea: acute on chronic resp failure due to COPD/OHS exac +/- pna +/- vol overload. Bipap dependent possibly, on [place] this am. Down to dry wt. SP IV steroids, on pred 40 qd now. On vanc/ aztreonam for RLL PNA per CT 10/21.  2. DNR/Goals of Care -  palliative care to meet with family again today. Very appreciative of their assistance in navigating this process. Difficult process.  She has been unhappy on dialysis since arrival in [place] with progressive decline in functional status.  3. TDC dysfunction: replaced by IR on 10/19 due to malfunction. 4. ESRD: TTS - next HD Tuesday 5. Hypertension/volume- as above. Gets midodrine before HD and mid run - net UF 854 cc Saturday -calculated post wt would be about 97.8  6. Anemia:Hgb 10.7  On  low dose Aranesp weekly 7. Metabolic bone disease: Continue home binders/VDRA/sensipar. P remains high - not really an issue at this point 8. Hypothyroidism: Continue synthroid.  7. T2DM   8. Nutrition- Renal/Carb mod diet orderedRenal vits, nepro  Kelly Splinter, MD 11/21/2018, 10:08 AM    Subjective:  Eating breakfast, on nasal cannula O2, not in distress, no new c/o's.    Objective Vitals:   11/20/18 1947 11/20/18 2350 11/21/18 0424 11/21/18 0856  BP: 138/72 126/70 (!) 167/82 133/89  Pulse: 72 68 80 78  Resp: (!) 21 (!) 21 (!) 21 20  Temp: 98.3 F (36.8 C)  98 F (36.7 C)   TempSrc: Oral  Oral   SpO2: 98% 95% 91% 94%  Weight:   100.5 kg   Height:       Physical Exam General: chronically ill female.  Heart: RRR Lungs: some exp wheezes - Abdomen: soft NT obese Extremities:tr - 1 + LE edema LE tender to touch Dialysis Access:  Right IJ [facility] exit ok     Additional Objective Labs: Basic Metabolic Panel: Recent Labs  Lab 11/15/18 0802 11/17/18 0728 11/19/18 0842  NA 134* 137 140  K 5.1 5.1 4.3  CL 92* 97* 99  CO2 22 24 23   GLUCOSE 220* 247* 107*  BUN 92* 63* 66*  CREATININE 8.15* 7.20* 6.54*  CALCIUM 8.2* 8.8* 8.7*  PHOS 9.2* 8.7* 9.1*   Liver Function Tests: Recent Labs  Lab 11/15/18 0802 11/17/18 0728 11/19/18 0842  ALBUMIN 3.2* 2.9* 3.0*   No results for input(s): LIPASE, AMYLASE in the last 168 hours. CBC: Recent Labs  Lab 11/15/18 0500 11/17/18 0728 11/19/18 0842  WBC 13.0* 15.0* 12.4*  HGB 10.0* 10.5* 10.7*  HCT 32.5* 35.7* 35.0*  MCV 98.8 103.2* 98.6  PLT 204 146* 176   No results for input(s): CKTOTAL, CKMB, CKMBINDEX, TROPONINI in the last 168 hours. CBG: Recent Labs  Lab 11/20/18 0751 11/20/18 1124 11/20/18 1620 11/20/18 2118 11/21/18 0732  GLUCAP 159* 196* 371* 230* 117*  Studies/Results: No results found. Medications: . aztreonam 0.5 g (11/21/18 0523)  . [START ON 11/22/2018] vancomycin     . [START ON 11/22/2018] calcitRIOL  1.75 mcg Oral Q T,Th,Sa-HD  . Chlorhexidine Gluconate Cloth  6 each Topical Q0600  . Chlorhexidine Gluconate Cloth  6 each  Topical Q0600  . [START ON 11/22/2018] cinacalcet  60 mg Oral Q T,Th,Sa-HD  . [START ON 11/22/2018] darbepoetin (ARANESP) injection - DIALYSIS  25 mcg Intravenous Q Tue-HD  . feeding supplement (NEPRO CARB STEADY)  237 mL Oral BID BM  . FLUoxetine  10 mg Oral Daily  . furosemide  80 mg Oral Q M,W,F,Su-1800  . insulin aspart  0-5 Units Subcutaneous QHS  . insulin aspart  0-9 Units Subcutaneous TID WC  . insulin detemir  10 Units Subcutaneous Daily  . levothyroxine  112 mcg Oral QAC breakfast  . [START ON 11/22/2018] midodrine  10 mg Oral Q T,Th,Sa-HD  . montelukast  10 mg Oral QHS  . multivitamin  1 tablet Oral QHS  . predniSONE  40 mg Oral QAC breakfast  . rosuvastatin  40 mg Oral q1800  . sevelamer carbonate  1,600 mg Oral TID WC

## 2018-11-21 NOTE — Progress Notes (Signed)
RN retuned pt's son [name] call and updated him on pt's plan of care. RN answered all of his question and he stated he would come to visit shortly.

## 2018-11-21 NOTE — Discharge Summary (Signed)
Physician Discharge Summary  Joan Mosley [id number] DOB: 1945/05/01 DOA: 11/10/2018  PCP: Flossie Buffy, NP  Admit date: 11/10/2018 Discharge date: 11/21/2018  Admitted From: Home.  [facility]position: [facility].   Recommendations for Outpatient Follow-up:  1. Follow up with PCP in 1-2 weeks 2. Please obtain BMP/CBC in one week 3. Please follow up with nephrology for HD.  4. Please follow up with ID as needed.  5. Please follow up with pulm as needed.     Discharge Condition:GUARDED.  CODE STATUS:DNR Diet recommendation: Heart Healthy Brief/Interim Summary: 73 year old lady prior history of pulmonary hypertension, COPD with 6 L of nasal cannula oxygen at home, hyperlipidemia, diabetes mellitus, hypothyroidism, depression, end-stage renal disease on dialysis on Tuesdays Thursdays and Saturdays, recliner bound, morbid obesity presents with generalized weakness and shortness of breath.  On arrival patient was found to be fluid overloaded and hypoxic requiring 100% of nonrebreather. She was weaned off the NRB to 13 to 15  lit of HF oxygen, intermittently requiring BIPAP. Due to her requirement of high flow oxygen despite HD, and IV solumedrol, a CT chest without contrast was done and showed right lower lobe opacity for which she was started on IV vancomycin and IV aztreonam. We were unable to wean her oxygen down and PCCM consulted for further recommendations. Palliative care consulted for goals of care as we are unable to wean her off the BiPAP/requirement of high flow oxygen.  Family would like to continue with BiPAP and HD. [facility] are [facility] . Discussed the above with the patient's POA ryan.  Discharge Diagnoses:  Principal Problem:   Acute on chronic respiratory failure with hypoxia and hypercapnia (HCC) Active Problems:   Hyperkalemia   Hypothyroidism   ESRD (end stage renal disease) (HCC)   COPD with acute exacerbation (HCC)   TIA (transient ischemic attack)    HLD (hyperlipidemia)   Type II diabetes mellitus with renal manifestations (HCC)   Depression with anxiety   COPD exacerbation (HCC)   Palliative care encounter   Aspiration pneumonia of right lower lobe (HCC)  Acute on chronic respiratory failure with hypoxia and hypercapnia secondary to hypervolemia and COPD exacerbation and right sided pneumonia.  CTA negative for PE, COVID-19 screening test is negative. Fluid management as per HD. Continue with incentive spirometry, chest physiotherapy and Lasix. CT chest without contrast showed right  lower lobe pneumonia. She was started on IV vancomycin and IV Azactam.  She continues to require high flow oxygen at 15 L/min and requiring BiPAP.  PCCM consulted for further evaluation.  Recommended DNR as she would not come off the vent if she gets intubated and will she will ultimately require a tracheostomy and long-term ventilation.  Recommend to continue with IV antibiotics and prednisone for COPD.  Repeat chest x-ray for follow-up.  Patient seen and examined this morning and she continues to require 15 L of high flow oxygen today and used BiPAP overnight   ESRD on HD IR consulted for exchange of the HD catheter  Back on the schedule on TTS. Fluid management as per hemodialysis and continue with Sensipar and calcitriol.   History of TIA Continue with statin    Hypothyroidism Continue with Synthroid.    Hyperkalemia  Resolved   Deconditioning PT eval recommending SNF.  Family would like her to go to [facility] before bed is available.  Clinical Education officer, museum on board.     Type 2 diabetes mellitus CBG (last 3)  Recent Labs (last 2 labs)  Recent Labs    11/20/18 2118 11/21/18 0732 11/21/18 1130  GLUCAP 230* 117* 214*     Continue with Levemir 10 units and added 2 units of NovoLog 3 times daily AC  Depression with anxiety With the Klonopin and Prozac  Hyperlipidemia Continue with  Crestor   Constipation  Senna  Essential hypertension Blood pressure parameters suboptimally controlled As needed hydralazine will be added.  In view of multiple medical problems poor functional status, unable to wean her oxygen down palliative care consulted and her CODE STATUS was changed to DNR.  Further goals of care discussion going on.    Discharge Instructions  Discharge Instructions    Diet - low sodium heart healthy   Complete by: As directed    Increase activity slowly   Complete by: As directed      Allergies as of 11/21/2018      Reactions   Avelox [moxifloxacin Hcl In Nacl] Other (See Comments)   Unknown reaction   Codeine Anaphylaxis   Penicillins Rash   Did it involve swelling of the face/tongue/throat, SOB, or low BP? Unknown Did it involve sudden or severe rash/hives, skin peeling, or any reaction on the inside of your mouth or nose? Yes Did you need to seek medical attention at a hospital or doctor's office? Unknown When did it last happen?unknown If all above answers are "NO", may proceed with cephalosporin use.   Sulfa Antibiotics Other (See Comments)   Unknown reaction   Dextromethorphan-guaifenesin Other (See Comments)   Reported by [facility] - unknown reaction   Shellfish Allergy Hives, Itching, Rash      Medication List    STOP taking these medications   clonazePAM 1 MG tablet Commonly known as: KLONOPIN   senna-docusate 8.6-50 MG tablet Commonly known as: Senna Plus   sevelamer 800 MG tablet Commonly known as: RENAGEL     TAKE these medications   acetaminophen 500 MG tablet Commonly known as: TYLENOL Take 1,000 mg by mouth every 6 (six) hours as needed for mild pain.   albuterol 108 (90 Base) MCG/ACT inhaler Commonly known as: VENTOLIN HFA Inhale 2 puffs into the lungs every 6 (six) hours as needed for wheezing or shortness of breath.   albuterol (2.5 MG/3ML) 0.083% nebulizer solution Commonly known as:  PROVENTIL Take 3 mLs (2.5 mg total) by nebulization 4 (four) times daily.   aztreonam 0.5 g in dextrose 5 % 50 mL Inject 0.5 g into the vein every 12 (twelve) hours. Start taking on: November 22, 2018   benzonatate 200 MG capsule Commonly known as: TESSALON Take 1 capsule (200 mg total) by mouth 3 (three) times daily as needed for cough.   calcitRIOL 0.25 MCG capsule Commonly known as: ROCALTROL Take 7 capsules (1.75 mcg total) by mouth Every Tuesday,Thursday,and Saturday with dialysis. Start taking on: November 22, 2018   cinacalcet 30 MG tablet Commonly known as: SENSIPAR Take 2 tablets (60 mg total) by mouth Every Tuesday,Thursday,and Saturday with dialysis. Start taking on: November 22, 2018   Darbepoetin Alfa 25 MCG/0.42ML Sosy injection Commonly known as: ARANESP Inject 0.42 mLs (25 mcg total) into the vein every Tuesday with hemodialysis. Start taking on: November 22, 2018   docusate sodium 100 MG capsule Commonly known as: COLACE Take 100 mg by mouth every morning.   feeding supplement (NEPRO CARB STEADY) Liqd Take 237 mLs by mouth 2 (two) times daily between meals. Start taking on: November 22, 2018   FLUoxetine 10 MG tablet Commonly known as: PROZAC  Take 1 tablet (10 mg total) by mouth daily.   furosemide 40 MG tablet Commonly known as: LASIX Take 80 mg by mouth See admin instructions. Take 2 tablets (80 mg) by mouth every Sunday, Monday, Wednesday, Friday mornings (non-dialysis days)   insulin detemir 100 UNIT/ML injection Commonly known as: LEVEMIR Inject 0.1 mLs (10 Units total) into the skin daily. Start taking on: November 22, 2018 What changed: how much to take   insulin lispro 100 UNIT/ML injection Commonly known as: HUMALOG Inject 1-5 Units into the skin 3 (three) times daily with meals as needed for high blood sugar (CBG >150). Sliding scale   ipratropium 0.02 % nebulizer solution Commonly known as: ATROVENT Inhale 0.5 mg into the lungs 4 (four)  times daily.   levothyroxine 112 MCG tablet Commonly known as: SYNTHROID Take 1 tablet (112 mcg total) by mouth daily before breakfast. 1 AM What changed: additional instructions   lidocaine 5 % Commonly known as: LIDODERM Place 1 patch onto the skin every 12 (twelve) hours as needed (back pain). Remove & Discard patch within 12 hours or as directed by MD   midodrine 10 MG tablet Commonly known as: PROAMATINE Take 10 mg by mouth See admin instructions. Take one tablet (10 mg) by mouth on Tuesday, Thursday, Saturday 45 minutes before dialysis   montelukast 10 MG tablet Commonly known as: SINGULAIR Take 10 mg by mouth at bedtime.   multivitamin Tabs tablet Take 1 tablet by mouth at bedtime.   nystatin powder Commonly known as: MYCOSTATIN/NYSTOP Apply 1 g topically 2 (two) times daily as needed (skin rash).   nystatin cream Commonly known as: MYCOSTATIN Apply 1 application topically 2 (two) times daily as needed for dry skin.   predniSONE 20 MG tablet Commonly known as: DELTASONE Take 2 tablets (40 mg total) by mouth daily before breakfast. Start taking on: November 22, 2018   rosuvastatin 40 MG tablet Commonly known as: CRESTOR Take 40 mg by mouth every morning.   senna 8.6 MG Tabs tablet Commonly known as: SENOKOT Take 1 tablet by mouth every morning.   sevelamer carbonate 800 MG tablet Commonly known as: RENVELA Take 2 tablets (1,600 mg total) by mouth 3 (three) times daily with meals.   vancomycin 1-5 GM/200ML-% Soln Commonly known as: VANCOCIN Inject 200 mLs (1,000 mg total) into the vein Every Tuesday,Thursday,and Saturday with dialysis. Start taking on: November 22, 2018       Allergies  Allergen Reactions  . Avelox [Moxifloxacin Hcl In Nacl] Other (See Comments)    Unknown reaction  . Codeine Anaphylaxis  . Penicillins Rash    Did it involve swelling of the face/tongue/throat, SOB, or low BP? Unknown Did it involve sudden or severe rash/hives, skin  peeling, or any reaction on the inside of your mouth or nose? Yes Did you need to seek medical attention at a hospital or doctor's office? Unknown When did it last happen?unknown If all above answers are "NO", may proceed with cephalosporin use.   . Sulfa Antibiotics Other (See Comments)    Unknown reaction  . Dextromethorphan-Guaifenesin Other (See Comments)    Reported by [facility] - unknown reaction  . Shellfish Allergy Hives, Itching and Rash    Consultations:     Procedures/Studies: Dg Chest 2 View  Result Date: 11/10/2018 CLINICAL DATA:  Nonproductive cough for 3 days. COPD. EXAM: CHEST - 2 VIEW COMPARISON:  09/24/2018 FINDINGS: Right jugular dual-lumen central venous catheter remains in appropriate position. Heart size is stable. Mild atelectasis or  scarring is seen at both lung bases, without significant change. No evidence of pulmonary consolidation or pleural effusion. IMPRESSION: Stable mild bibasilar atelectasis or scarring. Electronically Signed   By: Marlaine Hind M.D.   On: 11/10/2018 18:45   Ct Chest Wo Contrast  Result Date: 11/16/2018 CLINICAL DATA:  Chronic dyspnea, COPD. EXAM: CT CHEST WITHOUT CONTRAST TECHNIQUE: Multidetector CT imaging of the chest was performed following the standard protocol without IV contrast. COMPARISON:  November 10, 2018. FINDINGS: Cardiovascular: And atherosclerosis of thoracic aorta is noted without aneurysm formation. Normal cardiac size. No pericardial effusion. Coronary artery calcifications are noted. Right internal jugular catheter is noted with tip in right atrium. Enlarged pulmonary arteries are noted suggesting pulmonary artery hypertension. Mediastinum/Nodes: No enlarged mediastinal or axillary lymph nodes. Thyroid gland, trachea, and esophagus demonstrate no significant findings. Lungs/Pleura: No pneumothorax or significant pleural effusion is noted. Large right lower lobe airspace opacity is noted with air bronchograms  concerning for pneumonia. Stable scarring is noted in right upper lobe. Mild emphysematous disease is again noted. Upper Abdomen: No acute abnormality. Musculoskeletal: No chest wall mass or suspicious bone lesions identified. IMPRESSION: Large right lower lobe airspace opacity is noted with air bronchograms concerning for pneumonia. Stable enlarged pulmonary arteries are noted suggesting pulmonary artery hypertension. Coronary artery calcifications are noted. Aortic Atherosclerosis (ICD10-I70.0) and Emphysema (ICD10-J43.9). Electronically Signed   By: Marijo Conception M.D.   On: 11/16/2018 15:53   Ct Angio Chest Pe W And/or Wo Contrast  Result Date: 11/10/2018 CLINICAL DATA:  PE suspected, low pretest probability EXAM: CT ANGIOGRAPHY CHEST WITH CONTRAST TECHNIQUE: Multidetector CT imaging of the chest was performed using the standard protocol during bolus administration of intravenous contrast. Multiplanar CT image reconstructions and MIPs were obtained to evaluate the vascular anatomy. CONTRAST:  149mL OMNIPAQUE IOHEXOL 350 MG/ML SOLN COMPARISON:  CTA chest 09/11/2018 FINDINGS: Cardiovascular: Satisfactory opacification of the pulmonary arteries to the segmental level. No pulmonary arterial filling defects are visualized. Central pulmonary arterial enlargement is similar to prior exam and likely reflects chronic pulmonary artery hypertension. Suboptimal opacification of the thoracic aorta. Four vessel branching of the arch with the left vertebral artery arising directly from the arch. Atherosclerotic calcification is seen throughout the imaged aorta. Mild cardiomegaly with right atrial enlargement and reflux of contrast into the hepatic veins. Coronary artery calcifications are present. Aortic leaflet calcifications are seen as well. Mediastinum/Nodes: Shotty mediastinal adenopathy without enlarged axillary, hilar or mediastinal lymph nodes. Possibly reactive. Secretions are noted in the trachea with posterior  bowing reflecting imaging during exhalation. No acute abnormality of the thoracic esophagus. 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 Remaining portions of the thoracic inlet are unremarkable. Lungs/Pleura: Bandlike areas of opacity in the lower lobes likely reflect areas of chronic atelectasis and/or scarring overall similar to the comparison study. Some increasing atelectatic changes noted in the right middle lobe. Centrilobular and paraseptal emphysema is present throughout the lungs most pronounced in the lung apices. Additional scarring and fibrosis is noted in the upper lungs with right apical bullous disease as well. Upper Abdomen: No acute abnormalities present in the visualized portions of the upper abdomen. Musculoskeletal: Multilevel degenerative changes are present in the imaged portions of the spine. Multilevel flowing anterior osteophytosis compatible features of diffuse idiopathic skeletal hyperostosis (DISH). Additional degenerative changes the shoulders. Rightward curvature of the spine is likely positional. No acute osseous abnormality or suspicious osseous lesion. Review of the MIP images confirms the above findings. IMPRESSION: 1. No evidence  of pulmonary embolism. 2. Chronic pulmonary artery hypertension. 3. Cardiomegaly with right atrial enlargement and reflux of contrast into the hepatic veins, suggestive of right heart failure. 4. Volume loss with consolidation in the lung bases and right middle lobe has an appearance most compatible with subsegmental atelectasis and/or scarring. 5. Biapical fibrotic changes. 6. Aortic Atherosclerosis (ICD10-I70.0) 7. Emphysema (ICD10-J43.9). Electronically Signed   By: Lovena Le M.D.   On: 11/10/2018 22:20   Ir Fluoro Guide Cv Line Right  Result Date: 11/14/2018 CLINICAL DATA:  History of end-stage renal disease with poorly functioning indwelling tunneled hemodialysis catheter apparently placed a year and half ago.  EXAM: EXCHANGE OF TUNNELED CENTRAL VENOUS HEMODIALYSIS CATHETER UNDER FLUOROSCOPIC GUIDANCE ANESTHESIA/SEDATION: None MEDICATIONS: 1 g IV vancomycin. IV antibiotic was given in a appropriate time interval prior to skin puncture. FLUOROSCOPY TIME:  18 seconds.  1.2 mGy. PROCEDURE: The procedure, risks, benefits, and alternatives were explained to the patient. Questions regarding the procedure were encouraged and answered. The patient understands and consents to the procedure. A time-out was performed prior to initiating the procedure. The preexisting dialysis catheter and surrounding skin were prepped with chlorhexidine in a sterile fashion, and a sterile drape was applied covering the operative field. Maximum barrier sterile technique with sterile gowns and gloves were used for the procedure. Local anesthesia was provided with 1% lidocaine. The preexisting dialysis catheter was removed over a guidewire after freeing the subcutaneous cuff using blunt dissection. A new Palindrome tunneled hemodialysis catheter measuring 23 cm from tip to cuff was chosen for placement. This was advanced over the guidewire under fluoroscopy. Final catheter positioning was confirmed and documented with a fluoroscopic spot image. The catheter was aspirated, flushed with saline, and injected with appropriate volume heparin dwells. COMPLICATIONS: None.  No pneumothorax. FINDINGS: After catheter placement, the tip lies in the right atrium. The catheter aspirates normally and is ready for immediate use. IMPRESSION: Exchange of tunneled hemodialysis catheter via the right internal jugular vein. A 23 cm tip to cuff length Palindrome catheter was placed with the tip in the right atrium. The catheter is ready for immediate use. Electronically Signed   By: Aletta Edouard M.D.   On: 11/14/2018 12:30   Dg Chest Port 1 View  Result Date: 11/21/2018 CLINICAL DATA:  73 year old female with history of hypoxia. Follow-up study. EXAM: PORTABLE CHEST  1 VIEW COMPARISON:  Chest x-ray 11/18/2018. FINDINGS: Right internal jugular PermCath with tip terminating at the superior cavoatrial junction. Chronic elevation of the right hemidiaphragm is unchanged. Left lung appears clear. Persistent opacity at the right base corresponding to right lower lobe pneumonia and right middle lobe collapse, better demonstrated on chest CT 11/16/2018. Possible trace right pleural effusion. No left pleural effusion. No evidence of pulmonary edema. Heart size is normal. The patient is rotated to the right on today's exam, resulting in distortion of the mediastinal contours and reduced diagnostic sensitivity and specificity for mediastinal pathology. Aortic atherosclerosis. IMPRESSION: 1. Persistent right basilar opacity corresponding to right lower lobe pneumonia and right middle lobe collapse better demonstrated on recent chest CT. 2. Probable trace right pleural effusion. 3. Support apparatus, as above. 4. Aortic atherosclerosis. Electronically Signed   By: Vinnie Langton M.D.   On: 11/21/2018 16:33   Dg Chest Port 1 View  Result Date: 11/18/2018 CLINICAL DATA:  Hypoxia. EXAM: PORTABLE CHEST 1 VIEW COMPARISON:  Chest x-ray dated November 16, 2018. FINDINGS: The patient is rotated to the right. Unchanged tunneled right internal jugular dialysis catheter. Stable cardiomediastinal  silhouette. Unchanged consolidation in the right lower lobe and collapse of the right middle lobe with elevation of the right hemidiaphragm. Diffuse fine granular densities throughout both lungs are unchanged. No pneumothorax. No acute osseous abnormality. IMPRESSION: 1. Unchanged right lower lobe pneumonia and right middle lobe collapse. Electronically Signed   By: Titus Dubin M.D.   On: 11/18/2018 12:08   Dg Chest Port 1 View  Result Date: 11/16/2018 CLINICAL DATA:  Hypoxia, shortness of breath, congestion and cough. EXAM: PORTABLE CHEST 1 VIEW COMPARISON:  11/12/2018 chest x-ray and also chest  CT 11/10/2018 FINDINGS: The right IJ dialysis catheter is stable. The heart is within normal limits in size and there is stable tortuosity and calcification of the thoracic aorta. Stable underlying emphysematous changes and pulmonary scarring. Persistent bibasilar atelectasis and right pleural effusion or pleural thickening. No pulmonary edema. IMPRESSION: 1. Chronic emphysematous changes and pulmonary scarring. 2. Bibasilar atelectasis and possible small right effusion. 3. No definite infiltrates or edema. Electronically Signed   By: Marijo Sanes M.D.   On: 11/16/2018 13:17   Dg Chest Port 1 View  Result Date: 11/12/2018 CLINICAL DATA:  73 year old female with history of shortness of breath. EXAM: PORTABLE CHEST 1 VIEW COMPARISON:  Chest x-ray 11/10/2018. FINDINGS: Right internal jugular PermCath with tips terminating in the right atrium and superior cavoatrial junction. Study is limited by severe patient rotation to the right. Atelectasis and/or consolidation in the right lower lobe. Left lung is clear. No pleural effusions. No evidence of pulmonary edema. Heart size appears normal. Aortic atherosclerosis. IMPRESSION: 1. New area of atelectasis and/or consolidation in the right lower lobe. 2. Aortic atherosclerosis. Electronically Signed   By: Vinnie Langton M.D.   On: 11/12/2018 18:25      Subjective:  No new complaints.  Discharge Exam: Vitals:   11/21/18 1544 11/21/18 1605  BP:  (!) 145/81  Pulse: 81 80  Resp: 20 (!) 22  Temp:  98.2 F (36.8 C)  SpO2: 91% 93%   Vitals:   11/21/18 1134 11/21/18 1500 11/21/18 1544 11/21/18 1605  BP: (!) 167/90   (!) 145/81  Pulse: 88 78 81 80  Resp: (!) 23 (!) 21 20 (!) 22  Temp: 98 F (36.7 C)   98.2 F (36.8 C)  TempSrc: Oral   Oral  SpO2: 92% 93% 91% 93%  Weight:      Height:        General: Pt is alert, awake, not in acute distress Cardiovascular: RRR, S1/S2 +, no rubs, no gallops Respiratory: air entry fair,  Abdominal: Soft, NT,  ND, bowel sounds + Extremities: no edema, no cyanosis    The results of significant diagnostics from this hospitalization (including imaging, microbiology, ancillary and laboratory) are listed below for reference.     Microbiology: No results found for this or any previous visit (from the past 240 hour(s)).   Labs: BNP (last 3 results) Recent Labs    09/09/18 0031 11/10/18 1706  BNP 312.3* [id number]*   Basic Metabolic Panel: Recent Labs  Lab 11/15/18 0802 11/17/18 0728 11/19/18 0842  NA 134* 137 140  K 5.1 5.1 4.3  CL 92* 97* 99  CO2 22 24 23   GLUCOSE 220* 247* 107*  BUN 92* 63* 66*  CREATININE 8.15* 7.20* 6.54*  CALCIUM 8.2* 8.8* 8.7*  PHOS 9.2* 8.7* 9.1*   Liver Function Tests: Recent Labs  Lab 11/15/18 0802 11/17/18 0728 11/19/18 0842  ALBUMIN 3.2* 2.9* 3.0*   No results for input(s): LIPASE, AMYLASE  in the last 168 hours. No results for input(s): AMMONIA in the last 168 hours. CBC: Recent Labs  Lab 11/15/18 0500 11/17/18 0728 11/19/18 0842  WBC 13.0* 15.0* 12.4*  HGB 10.0* 10.5* 10.7*  HCT 32.5* 35.7* 35.0*  MCV 98.8 103.2* 98.6  PLT 204 146* 176   Cardiac Enzymes: No results for input(s): CKTOTAL, CKMB, CKMBINDEX, TROPONINI in the last 168 hours. BNP: Invalid input(s): POCBNP CBG: Recent Labs  Lab 11/20/18 1620 11/20/18 2118 11/21/18 0732 11/21/18 1130 11/21/18 1602  GLUCAP 371* 230* 117* 214* 328*   D-Dimer No results for input(s): DDIMER in the last 72 hours. Hgb A1c No results for input(s): HGBA1C in the last 72 hours. Lipid Profile No results for input(s): CHOL, HDL, LDLCALC, TRIG, CHOLHDL, LDLDIRECT in the last 72 hours. Thyroid function studies No results for input(s): TSH, T4TOTAL, T3FREE, THYROIDAB in the last 72 hours.  Invalid input(s): FREET3 Anemia work up No results for input(s): VITAMINB12, FOLATE, FERRITIN, TIBC, IRON, RETICCTPCT in the last 72 hours. Urinalysis    Component Value Date/Time   COLORURINE YELLOW  04/20/2018 0301   APPEARANCEUR HAZY (A) 04/20/2018 0301   LABSPEC 1.009 04/20/2018 0301   PHURINE 9.0 (H) 04/20/2018 0301   GLUCOSEU 150 (A) 04/20/2018 0301   HGBUR MODERATE (A) 04/20/2018 0301   BILIRUBINUR NEGATIVE 04/20/2018 0301   KETONESUR NEGATIVE 04/20/2018 0301   PROTEINUR 100 (A) 04/20/2018 0301   NITRITE NEGATIVE 04/20/2018 0301   LEUKOCYTESUR NEGATIVE 04/20/2018 0301   Sepsis Labs Invalid input(s): PROCALCITONIN,  WBC,  LACTICIDVEN Microbiology No results found for this or any previous visit (from the past 240 hour(s)).   Time coordinating discharge: 38 minutes  SIGNED:   Hosie Poisson, MD  [facility] 11/21/2018, 4:55 PM Pager   If 7PM-7AM, please contact night-coverage [email or web address] Password [email or web address]

## 2018-11-22 ENCOUNTER — Encounter (HOSPITAL_COMMUNITY): Payer: Self-pay

## 2018-11-22 LAB — GLUCOSE, CAPILLARY
Glucose-Capillary: 113 mg/dL — ABNORMAL HIGH (ref 70–99)
Glucose-Capillary: 196 mg/dL — ABNORMAL HIGH (ref 70–99)

## 2018-11-22 MED ORDER — CALCITRIOL 0.5 MCG PO CAPS
ORAL_CAPSULE | ORAL | Status: AC
  Filled 2018-11-22: qty 3

## 2018-11-22 MED ORDER — DARBEPOETIN ALFA 60 MCG/0.3ML IJ SOSY
PREFILLED_SYRINGE | INTRAMUSCULAR | Status: AC
  Administered 2018-11-22: 60 ug
  Filled 2018-11-22: qty 0.3

## 2018-11-22 MED ORDER — ALBUTEROL SULFATE (2.5 MG/3ML) 0.083% IN NEBU
INHALATION_SOLUTION | RESPIRATORY_TRACT | Status: AC
  Administered 2018-11-22: 2.5 mg via RESPIRATORY_TRACT
  Filled 2018-11-22: qty 3

## 2018-11-22 MED ORDER — VANCOMYCIN HCL IN DEXTROSE 1-5 GM/200ML-% IV SOLN
INTRAVENOUS | Status: AC
  Administered 2018-11-22: 1000 mg via INTRAVENOUS
  Filled 2018-11-22: qty 200

## 2018-11-22 MED ORDER — MIDODRINE HCL 5 MG PO TABS
ORAL_TABLET | ORAL | Status: AC
  Administered 2018-11-22: 10 mg via ORAL
  Filled 2018-11-22: qty 2

## 2018-11-22 MED ORDER — ALBUTEROL SULFATE (2.5 MG/3ML) 0.083% IN NEBU
2.5000 mg | INHALATION_SOLUTION | Freq: Once | RESPIRATORY_TRACT | Status: AC
  Administered 2018-11-22: 11:00:00 2.5 mg via RESPIRATORY_TRACT

## 2018-11-22 MED ORDER — CALCITRIOL 0.25 MCG PO CAPS
ORAL_CAPSULE | ORAL | Status: AC
  Filled 2018-11-22: qty 1

## 2018-11-22 MED ORDER — IPRATROPIUM-ALBUTEROL 0.5-2.5 (3) MG/3ML IN SOLN
RESPIRATORY_TRACT | Status: AC
  Filled 2018-11-22: qty 3

## 2018-11-22 MED ORDER — HEPARIN SODIUM (PORCINE) 1000 UNIT/ML IJ SOLN
INTRAMUSCULAR | Status: AC
  Administered 2018-11-22: 16:00:00
  Filled 2018-11-22: qty 4

## 2018-11-22 MED ORDER — CINACALCET HCL 30 MG PO TABS
ORAL_TABLET | ORAL | Status: AC
  Filled 2018-11-22: qty 2

## 2018-11-22 NOTE — Telephone Encounter (Signed)
Which son called? Based on my first OV with Ms. Eskelson, my understanding was that [name] was the primary caregiver.  Once Ms Tobey is discharged from hospital, please schedule appt to discuss this

## 2018-11-22 NOTE — Plan of Care (Signed)

## 2018-11-22 NOTE — Progress Notes (Signed)
[facility] Progress Note   Dialysis Orders: [facility] 4h    99kg    2K/2Ca     P4   Heparin - none    TDC R chest (now replaced 11/14/18, IR) - Calcitriol 1.79mcg PO q HD - Sensipar 60mg  PO q HD - Venofer 100 x 5 ordered - Mircera 100 q 2 weeks  Assessment/Plan: 1. Dyspnea: acute on chronic resp failure due to COPD/OHS exacerbation +/- pna +/- vol overload. 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 Down to dry wt. SP IV steroids, on pred 40 qd now. On vanc/ aztreonam for RLL PNA per CT 10/21.  2. DNR/Goals of Care - appreciative of pall care assistance in navigating this process. Difficult process.  She has been unhappy on dialysis since arrival in [place] with progressive decline in functional status.  3. TDC dysfunction: replaced by IR on 10/19 due to malfunction. 4. ESRD: TTS - HD today 5. Hypertension/volume- as above. Gets midodrine before HD and mid run. Up 7kg today, not sure wt's are accurate, was at dry wt 99kg 2d ago.  6. Anemia:Hgb 10.7  On  low dose Aranesp weekly 7. Metabolic bone disease: Continue home binders/VDRA/sensipar. P remains high - not really an issue at this point 8. Hypothyroidism: Continue synthroid.  7. T2DM   8. Nutrition- Renal/Carb mod diet orderedRenal vits, nepro  Kelly Splinter, MD 11/22/2018, 2:04 PM    Subjective:  Eating breakfast, on nasal cannula O2, not in distress, no new c/o's.    Objective Vitals:   11/22/18 1050 11/22/18 1100 11/22/18 1113 11/22/18 1119  BP: (!) 150/78 (!) 145/71    Pulse: 81 81    Resp:      Temp:      TempSrc:      SpO2:   91% 90%  Weight:      Height:       Physical Exam General: chronically ill female.  Heart: RRR Lungs: some exp wheezes, no rales Abdomen: soft NT obese Extremities:tr - 1 + LE edema LE tender to touch Dialysis Access:  Right IJ [facility] exit ok    Additional Objective Labs: Basic Metabolic Panel: Recent Labs  Lab 11/17/18 0728 11/19/18 0842  NA 137 140  K 5.1 4.3   CL 97* 99  CO2 24 23  GLUCOSE 247* 107*  BUN 63* 66*  CREATININE 7.20* 6.54*  CALCIUM 8.8* 8.7*  PHOS 8.7* 9.1*   Liver Function Tests: Recent Labs  Lab 11/17/18 0728 11/19/18 0842  ALBUMIN 2.9* 3.0*   No results for input(s): LIPASE, AMYLASE in the last 168 hours. CBC: Recent Labs  Lab 11/17/18 0728 11/19/18 0842  WBC 15.0* 12.4*  HGB 10.5* 10.7*  HCT 35.7* 35.0*  MCV 103.2* 98.6  PLT 146* 176   No results for input(s): CKTOTAL, CKMB, CKMBINDEX, TROPONINI in the last 168 hours. CBG: Recent Labs  Lab 11/21/18 0732 11/21/18 1130 11/21/18 1602 11/21/18 2139 11/22/18 0807  GLUCAP 117* 214* 328* 174* 113*  Studies/Results: Dg Chest Port 1 View  Result Date: 11/21/2018 CLINICAL DATA:  73 year old female with history of hypoxia. Follow-up study. EXAM: PORTABLE CHEST 1 VIEW COMPARISON:  Chest x-ray 11/18/2018. FINDINGS: Right internal jugular PermCath with tip terminating at the superior cavoatrial junction. Chronic elevation of the right hemidiaphragm is unchanged. Left lung appears clear. Persistent opacity at the right base corresponding to right lower lobe pneumonia and right middle lobe collapse, better demonstrated on chest CT 11/16/2018. Possible trace right pleural effusion. No left pleural effusion.  No evidence of pulmonary edema. Heart size is normal. The patient is rotated to the right on today's exam, resulting in distortion of the mediastinal contours and reduced diagnostic sensitivity and specificity for mediastinal pathology. Aortic atherosclerosis. IMPRESSION: 1. Persistent right basilar opacity corresponding to right lower lobe pneumonia and right middle lobe collapse better demonstrated on recent chest CT. 2. Probable trace right pleural effusion. 3. Support apparatus, as above. 4. Aortic atherosclerosis. Electronically Signed   By: Vinnie Langton M.D.   On: 11/21/2018 16:33   Medications: . aztreonam 0.5 g (11/22/18 0419)  . vancomycin 1,000 mg (11/22/18  1340)   . calcitRIOL      . calcitRIOL      . calcitRIOL  1.75 mcg Oral Q T,Th,Sa-HD  . Chlorhexidine Gluconate Cloth  6 each Topical Q0600  . Chlorhexidine Gluconate Cloth  6 each Topical Q0600  . Chlorhexidine Gluconate Cloth  6 each Topical Q0600  . cinacalcet      . cinacalcet  60 mg Oral Q T,Th,Sa-HD  . darbepoetin (ARANESP) injection - DIALYSIS  25 mcg Intravenous Q Tue-HD  . feeding supplement (NEPRO CARB STEADY)  237 mL Oral BID BM  . FLUoxetine  10 mg Oral Daily  . furosemide  80 mg Oral Q M,W,F,Su-1800  . insulin aspart  0-5 Units Subcutaneous QHS  . insulin aspart  0-9 Units Subcutaneous TID WC  . insulin aspart  2 Units Subcutaneous TID WC  . insulin detemir  10 Units Subcutaneous Daily  . ipratropium-albuterol  3 mL Nebulization Q4H WA  . levothyroxine  112 mcg Oral QAC breakfast  . midodrine  10 mg Oral Q T,Th,Sa-HD  . montelukast  10 mg Oral QHS  . multivitamin  1 tablet Oral QHS  . rosuvastatin  40 mg Oral q1800  . sevelamer carbonate  1,600 mg Oral TID WC

## 2018-11-22 NOTE — TOC Transition Note (Signed)
Transition of Care [facility]) - CM/SW Discharge Note   Patient Details  Name: Joan Mosley MRN: [id number] Date of Birth: 04-25-1945  [facility] CM/SW Contact:  Eileen Stanford, LCSW Phone Number: 11/22/2018, 3:29 PM   Clinical Narrative:   Pt will transfer to [facility]. Pt's son. MD has been notified.   RN to call report to [phone], or [phone]. Pt will go to room 317. Accepting MD is Dr. Manson Allan.  Pt will be transported via [facility]. Transport is set for 6:00. If there are any issues please call Lorene with [facility] at [phone].       Barriers to Discharge: Continued Medical Work up   Patient Goals and CMS Choice        Discharge Placement                       Discharge Plan and Services In-house Referral: NA   Post Acute Care Choice: Long Term Acute Care (LTAC)                               Social Determinants of Health (SDOH) Interventions     Readmission Risk Interventions Readmission Risk Prevention Plan 10/04/2018  Transportation Screening Complete  Medication Review [employer]) Complete  PCP or Specialist appointment within 3-5 days of discharge Complete  HRI or [place] Complete  SW Recovery Care/Counseling Consult Complete  Palliative Care Screening Complete  [place] Patient Refused

## 2018-11-22 NOTE — Progress Notes (Signed)
Report given to RN at [facility]. Discharge gone over w/ pt and son who is at bedside. Pt a&o x4, no complaints at this time.

## 2018-11-22 NOTE — Telephone Encounter (Signed)
Will address this during her next appt with me

## 2018-11-22 NOTE — Procedures (Signed)
   I was present at this dialysis session, have reviewed the session itself and made  appropriate changes Kelly Splinter MD [place] pager [phone]   11/22/2018, 2:04 PM

## 2018-11-22 NOTE — Discharge Summary (Signed)
Physician Discharge Summary  Joan Mosley [id number] DOB: February 06, 1945 DOA: 11/10/2018  PCP: Flossie Buffy, NP  Admit date: 11/10/2018 Discharge date: 11/22/2018  Admitted From: Home.  [facility]position: [facility].   Recommendations for Outpatient Follow-up:  1. Follow up with PCP in 1-2 weeks 2. Please obtain BMP/CBC in one week 3. Please follow up with nephrology for HD.  4. Please follow up with ID as needed.  5. Please follow up with pulm as needed.     Discharge Condition:GUARDED.  CODE STATUS:DNR Diet recommendation: Heart Healthy Brief/Interim Summary: 73 year old lady prior history of pulmonary hypertension, COPD with 6 L of nasal cannula oxygen at home, hyperlipidemia, diabetes mellitus, hypothyroidism, depression, end-stage renal disease on dialysis on Tuesdays Thursdays and Saturdays, recliner bound, morbid obesity presents with generalized weakness and shortness of breath.  On arrival patient was found to be fluid overloaded and hypoxic requiring 100% of nonrebreather. She was weaned off the NRB to 13 to 15  lit of HF oxygen, intermittently requiring BIPAP. Due to her requirement of high flow oxygen despite HD, and IV solumedrol, a CT chest without contrast was done and showed right lower lobe opacity for which she was started on IV vancomycin and IV aztreonam. We were unable to wean her oxygen down and PCCM consulted for further recommendations. Palliative care consulted for goals of care as we are unable to wean her off the BiPAP/requirement of high flow oxygen.  Family would like to continue with BiPAP and HD. We are looking at [facility] . Discussed the above with the patient's POA ryan.  Discharge Diagnoses:  Principal Problem:   Acute on chronic respiratory failure with hypoxia and hypercapnia (HCC) Active Problems:   Hyperkalemia   Hypothyroidism   ESRD (end stage renal disease) (HCC)   COPD with acute exacerbation (HCC)   TIA (transient ischemic attack)    HLD (hyperlipidemia)   Type II diabetes mellitus with renal manifestations (HCC)   Depression with anxiety   COPD exacerbation (HCC)   Palliative care encounter   Aspiration pneumonia of right lower lobe (HCC)  Acute on chronic respiratory failure with hypoxia and hypercapnia secondary to hypervolemia and COPD exacerbation and right sided pneumonia.  CTA negative for PE, COVID-19 screening test is negative. Fluid management as per HD. Continue with incentive spirometry, chest physiotherapy and Lasix. CT chest without contrast showed right  lower lobe pneumonia. She was started on IV vancomycin and IV Azactam.  She continues to require high flow oxygen at 15 L/min and requiring BiPAP.  PCCM consulted for further evaluation.  Recommended DNR as she would not come off the vent if she gets intubated and will she will ultimately require a tracheostomy and long-term ventilation.  Recommend to continue with IV antibiotics and prednisone for COPD.  Repeat chest x-ray for follow-up.  Patient seen and examined this morning and she continues to require 15 L of high flow oxygen today and used BiPAP overnight   ESRD on HD IR consulted for exchange of the HD catheter  Back on the schedule on TTS. Fluid management as per hemodialysis and continue with Sensipar and calcitriol.   History of TIA Continue with statin    Hypothyroidism Continue with Synthroid.    Hyperkalemia  Resolved   Deconditioning PT eval recommending SNF.  Family would like her to go to [facility] before bed is available.  Clinical Education officer, museum on board.     Type 2 diabetes mellitus CBG (last 3)  Recent Labs (last 2 labs)  Recent Labs    11/20/18 2118 11/21/18 0732 11/21/18 1130  GLUCAP 230* 117* 214*     Continue with Levemir 10 units and added 2 units of NovoLog 3 times daily AC  Depression with anxiety With the Klonopin and Prozac  Hyperlipidemia Continue with  Crestor   Constipation  Senna  Essential hypertension Blood pressure parameters suboptimally controlled As needed hydralazine will be added.  In view of multiple medical problems poor functional status, unable to wean her oxygen down palliative care consulted and her CODE STATUS was changed to DNR.  Further goals of care discussion going on.    Discharge Instructions  Discharge Instructions    Diet - low sodium heart healthy   Complete by: As directed    Increase activity slowly   Complete by: As directed      Allergies as of 11/22/2018      Reactions   Avelox [moxifloxacin Hcl In Nacl] Other (See Comments)   Unknown reaction   Codeine Anaphylaxis   Penicillins Rash   Did it involve swelling of the face/tongue/throat, SOB, or low BP? Unknown Did it involve sudden or severe rash/hives, skin peeling, or any reaction on the inside of your mouth or nose? Yes Did you need to seek medical attention at a hospital or doctor's office? Unknown When did it last happen?unknown If all above answers are "NO", may proceed with cephalosporin use.   Sulfa Antibiotics Other (See Comments)   Unknown reaction   Dextromethorphan-guaifenesin Other (See Comments)   Reported by [facility] - unknown reaction   Shellfish Allergy Hives, Itching, Rash      Medication List    STOP taking these medications   clonazePAM 1 MG tablet Commonly known as: KLONOPIN   senna-docusate 8.6-50 MG tablet Commonly known as: Senna Plus   sevelamer 800 MG tablet Commonly known as: RENAGEL     TAKE these medications   acetaminophen 500 MG tablet Commonly known as: TYLENOL Take 1,000 mg by mouth every 6 (six) hours as needed for mild pain.   albuterol 108 (90 Base) MCG/ACT inhaler Commonly known as: VENTOLIN HFA Inhale 2 puffs into the lungs every 6 (six) hours as needed for wheezing or shortness of breath.   albuterol (2.5 MG/3ML) 0.083% nebulizer solution Commonly known as:  PROVENTIL Take 3 mLs (2.5 mg total) by nebulization 4 (four) times daily.   aztreonam 0.5 g in dextrose 5 % 50 mL Inject 0.5 g into the vein every 12 (twelve) hours.   benzonatate 200 MG capsule Commonly known as: TESSALON Take 1 capsule (200 mg total) by mouth 3 (three) times daily as needed for cough.   calcitRIOL 0.25 MCG capsule Commonly known as: ROCALTROL Take 7 capsules (1.75 mcg total) by mouth Every Tuesday,Thursday,and Saturday with dialysis.   cinacalcet 30 MG tablet Commonly known as: SENSIPAR Take 2 tablets (60 mg total) by mouth Every Tuesday,Thursday,and Saturday with dialysis.   Darbepoetin Alfa 25 MCG/0.42ML Sosy injection Commonly known as: ARANESP Inject 0.42 mLs (25 mcg total) into the vein every Tuesday with hemodialysis.   docusate sodium 100 MG capsule Commonly known as: COLACE Take 100 mg by mouth every morning.   feeding supplement (NEPRO CARB STEADY) Liqd Take 237 mLs by mouth 2 (two) times daily between meals.   FLUoxetine 10 MG tablet Commonly known as: PROZAC Take 1 tablet (10 mg total) by mouth daily.   furosemide 40 MG tablet Commonly known as: LASIX Take 80 mg by mouth See admin instructions. Take 2 tablets (  80 mg) by mouth every Sunday, Monday, Wednesday, Friday mornings (non-dialysis days)   insulin detemir 100 UNIT/ML injection Commonly known as: LEVEMIR Inject 0.1 mLs (10 Units total) into the skin daily. What changed: how much to take   insulin lispro 100 UNIT/ML injection Commonly known as: HUMALOG Inject 1-5 Units into the skin 3 (three) times daily with meals as needed for high blood sugar (CBG >150). Sliding scale   ipratropium 0.02 % nebulizer solution Commonly known as: ATROVENT Inhale 0.5 mg into the lungs 4 (four) times daily.   levothyroxine 112 MCG tablet Commonly known as: SYNTHROID Take 1 tablet (112 mcg total) by mouth daily before breakfast. 1 AM What changed: additional instructions   lidocaine 5 % Commonly  known as: LIDODERM Place 1 patch onto the skin every 12 (twelve) hours as needed (back pain). Remove & Discard patch within 12 hours or as directed by MD   midodrine 10 MG tablet Commonly known as: PROAMATINE Take 10 mg by mouth See admin instructions. Take one tablet (10 mg) by mouth on Tuesday, Thursday, Saturday 45 minutes before dialysis   montelukast 10 MG tablet Commonly known as: SINGULAIR Take 10 mg by mouth at bedtime.   multivitamin Tabs tablet Take 1 tablet by mouth at bedtime.   nystatin powder Commonly known as: MYCOSTATIN/NYSTOP Apply 1 g topically 2 (two) times daily as needed (skin rash).   nystatin cream Commonly known as: MYCOSTATIN Apply 1 application topically 2 (two) times daily as needed for dry skin.   predniSONE 20 MG tablet Commonly known as: DELTASONE Take 2 tablets (40 mg total) by mouth daily before breakfast.   rosuvastatin 40 MG tablet Commonly known as: CRESTOR Take 40 mg by mouth every morning.   senna 8.6 MG Tabs tablet Commonly known as: SENOKOT Take 1 tablet by mouth every morning.   sevelamer carbonate 800 MG tablet Commonly known as: RENVELA Take 2 tablets (1,600 mg total) by mouth 3 (three) times daily with meals.   vancomycin 1-5 GM/200ML-% Soln Commonly known as: VANCOCIN Inject 200 mLs (1,000 mg total) into the vein Every Tuesday,Thursday,and Saturday with dialysis.       Allergies  Allergen Reactions  . Avelox [Moxifloxacin Hcl In Nacl] Other (See Comments)    Unknown reaction  . Codeine Anaphylaxis  . Penicillins Rash    Did it involve swelling of the face/tongue/throat, SOB, or low BP? Unknown Did it involve sudden or severe rash/hives, skin peeling, or any reaction on the inside of your mouth or nose? Yes Did you need to seek medical attention at a hospital or doctor's office? Unknown When did it last happen?unknown If all above answers are "NO", may proceed with cephalosporin use.   . Sulfa Antibiotics Other  (See Comments)    Unknown reaction  . Dextromethorphan-Guaifenesin Other (See Comments)    Reported by [facility] - unknown reaction  . Shellfish Allergy Hives, Itching and Rash    Consultations:     Procedures/Studies: Dg Chest 2 View  Result Date: 11/10/2018 CLINICAL DATA:  Nonproductive cough for 3 days. COPD. EXAM: CHEST - 2 VIEW COMPARISON:  09/24/2018 FINDINGS: Right jugular dual-lumen central venous catheter remains in appropriate position. Heart size is stable. Mild atelectasis or scarring is seen at both lung bases, without significant change. No evidence of pulmonary consolidation or pleural effusion. IMPRESSION: Stable mild bibasilar atelectasis or scarring. Electronically Signed   By: Marlaine Hind M.D.   On: 11/10/2018 18:45   Ct Chest Wo Contrast  Result Date:  11/16/2018 CLINICAL DATA:  Chronic dyspnea, COPD. EXAM: CT CHEST WITHOUT CONTRAST TECHNIQUE: Multidetector CT imaging of the chest was performed following the standard protocol without IV contrast. COMPARISON:  November 10, 2018. FINDINGS: Cardiovascular: And atherosclerosis of thoracic aorta is noted without aneurysm formation. Normal cardiac size. No pericardial effusion. Coronary artery calcifications are noted. Right internal jugular catheter is noted with tip in right atrium. Enlarged pulmonary arteries are noted suggesting pulmonary artery hypertension. Mediastinum/Nodes: No enlarged mediastinal or axillary lymph nodes. Thyroid gland, trachea, and esophagus demonstrate no significant findings. Lungs/Pleura: No pneumothorax or significant pleural effusion is noted. Large right lower lobe airspace opacity is noted with air bronchograms concerning for pneumonia. Stable scarring is noted in right upper lobe. Mild emphysematous disease is again noted. Upper Abdomen: No acute abnormality. Musculoskeletal: No chest wall mass or suspicious bone lesions identified. IMPRESSION: Large right lower lobe airspace opacity is noted with  air bronchograms concerning for pneumonia. Stable enlarged pulmonary arteries are noted suggesting pulmonary artery hypertension. Coronary artery calcifications are noted. Aortic Atherosclerosis (ICD10-I70.0) and Emphysema (ICD10-J43.9). Electronically Signed   By: Marijo Conception M.D.   On: 11/16/2018 15:53   Ct Angio Chest Pe W And/or Wo Contrast  Result Date: 11/10/2018 CLINICAL DATA:  PE suspected, low pretest probability EXAM: CT ANGIOGRAPHY CHEST WITH CONTRAST TECHNIQUE: Multidetector CT imaging of the chest was performed using the standard protocol during bolus administration of intravenous contrast. Multiplanar CT image reconstructions and MIPs were obtained to evaluate the vascular anatomy. CONTRAST:  184mL OMNIPAQUE IOHEXOL 350 MG/ML SOLN COMPARISON:  CTA chest 09/11/2018 FINDINGS: Cardiovascular: Satisfactory opacification of the pulmonary arteries to the segmental level. No pulmonary arterial filling defects are visualized. Central pulmonary arterial enlargement is similar to prior exam and likely reflects chronic pulmonary artery hypertension. Suboptimal opacification of the thoracic aorta. Four vessel branching of the arch with the left vertebral artery arising directly from the arch. Atherosclerotic calcification is seen throughout the imaged aorta. Mild cardiomegaly with right atrial enlargement and reflux of contrast into the hepatic veins. Coronary artery calcifications are present. Aortic leaflet calcifications are seen as well. Mediastinum/Nodes: Shotty mediastinal adenopathy without enlarged axillary, hilar or mediastinal lymph nodes. Possibly reactive. Secretions are noted in the trachea with posterior bowing reflecting imaging during exhalation. No acute abnormality of the thoracic esophagus. 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 Remaining portions of the thoracic inlet are unremarkable. Lungs/Pleura: Bandlike areas of opacity in the lower lobes likely  reflect areas of chronic atelectasis and/or scarring overall similar to the comparison study. Some increasing atelectatic changes noted in the right middle lobe. Centrilobular and paraseptal emphysema is present throughout the lungs most pronounced in the lung apices. Additional scarring and fibrosis is noted in the upper lungs with right apical bullous disease as well. Upper Abdomen: No acute abnormalities present in the visualized portions of the upper abdomen. Musculoskeletal: Multilevel degenerative changes are present in the imaged portions of the spine. Multilevel flowing anterior osteophytosis compatible features of diffuse idiopathic skeletal hyperostosis (DISH). Additional degenerative changes the shoulders. Rightward curvature of the spine is likely positional. No acute osseous abnormality or suspicious osseous lesion. Review of the MIP images confirms the above findings. IMPRESSION: 1. No evidence of pulmonary embolism. 2. Chronic pulmonary artery hypertension. 3. Cardiomegaly with right atrial enlargement and reflux of contrast into the hepatic veins, suggestive of right heart failure. 4. Volume loss with consolidation in the lung bases and right middle lobe has an appearance most compatible with subsegmental atelectasis  and/or scarring. 5. Biapical fibrotic changes. 6. Aortic Atherosclerosis (ICD10-I70.0) 7. Emphysema (ICD10-J43.9). Electronically Signed   By: Lovena Le M.D.   On: 11/10/2018 22:20   Ir Fluoro Guide Cv Line Right  Result Date: 11/14/2018 CLINICAL DATA:  History of end-stage renal disease with poorly functioning indwelling tunneled hemodialysis catheter apparently placed a year and half ago. EXAM: EXCHANGE OF TUNNELED CENTRAL VENOUS HEMODIALYSIS CATHETER UNDER FLUOROSCOPIC GUIDANCE ANESTHESIA/SEDATION: None MEDICATIONS: 1 g IV vancomycin. IV antibiotic was given in a appropriate time interval prior to skin puncture. FLUOROSCOPY TIME:  18 seconds.  1.2 mGy. PROCEDURE: The  procedure, risks, benefits, and alternatives were explained to the patient. Questions regarding the procedure were encouraged and answered. The patient understands and consents to the procedure. A time-out was performed prior to initiating the procedure. The preexisting dialysis catheter and surrounding skin were prepped with chlorhexidine in a sterile fashion, and a sterile drape was applied covering the operative field. Maximum barrier sterile technique with sterile gowns and gloves were used for the procedure. Local anesthesia was provided with 1% lidocaine. The preexisting dialysis catheter was removed over a guidewire after freeing the subcutaneous cuff using blunt dissection. A new Palindrome tunneled hemodialysis catheter measuring 23 cm from tip to cuff was chosen for placement. This was advanced over the guidewire under fluoroscopy. Final catheter positioning was confirmed and documented with a fluoroscopic spot image. The catheter was aspirated, flushed with saline, and injected with appropriate volume heparin dwells. COMPLICATIONS: None.  No pneumothorax. FINDINGS: After catheter placement, the tip lies in the right atrium. The catheter aspirates normally and is ready for immediate use. IMPRESSION: Exchange of tunneled hemodialysis catheter via the right internal jugular vein. A 23 cm tip to cuff length Palindrome catheter was placed with the tip in the right atrium. The catheter is ready for immediate use. Electronically Signed   By: Aletta Edouard M.D.   On: 11/14/2018 12:30   Dg Chest Port 1 View  Result Date: 11/21/2018 CLINICAL DATA:  73 year old female with history of hypoxia. Follow-up study. EXAM: PORTABLE CHEST 1 VIEW COMPARISON:  Chest x-ray 11/18/2018. FINDINGS: Right internal jugular PermCath with tip terminating at the superior cavoatrial junction. Chronic elevation of the right hemidiaphragm is unchanged. Left lung appears clear. Persistent opacity at the right base corresponding to  right lower lobe pneumonia and right middle lobe collapse, better demonstrated on chest CT 11/16/2018. Possible trace right pleural effusion. No left pleural effusion. No evidence of pulmonary edema. Heart size is normal. The patient is rotated to the right on today's exam, resulting in distortion of the mediastinal contours and reduced diagnostic sensitivity and specificity for mediastinal pathology. Aortic atherosclerosis. IMPRESSION: 1. Persistent right basilar opacity corresponding to right lower lobe pneumonia and right middle lobe collapse better demonstrated on recent chest CT. 2. Probable trace right pleural effusion. 3. Support apparatus, as above. 4. Aortic atherosclerosis. Electronically Signed   By: Vinnie Langton M.D.   On: 11/21/2018 16:33   Dg Chest Port 1 View  Result Date: 11/18/2018 CLINICAL DATA:  Hypoxia. EXAM: PORTABLE CHEST 1 VIEW COMPARISON:  Chest x-ray dated November 16, 2018. FINDINGS: The patient is rotated to the right. Unchanged tunneled right internal jugular dialysis catheter. Stable cardiomediastinal silhouette. Unchanged consolidation in the right lower lobe and collapse of the right middle lobe with elevation of the right hemidiaphragm. Diffuse fine granular densities throughout both lungs are unchanged. No pneumothorax. No acute osseous abnormality. IMPRESSION: 1. Unchanged right lower lobe pneumonia and right middle lobe collapse.  Electronically Signed   By: Titus Dubin M.D.   On: 11/18/2018 12:08   Dg Chest Port 1 View  Result Date: 11/16/2018 CLINICAL DATA:  Hypoxia, shortness of breath, congestion and cough. EXAM: PORTABLE CHEST 1 VIEW COMPARISON:  11/12/2018 chest x-ray and also chest CT 11/10/2018 FINDINGS: The right IJ dialysis catheter is stable. The heart is within normal limits in size and there is stable tortuosity and calcification of the thoracic aorta. Stable underlying emphysematous changes and pulmonary scarring. Persistent bibasilar atelectasis and  right pleural effusion or pleural thickening. No pulmonary edema. IMPRESSION: 1. Chronic emphysematous changes and pulmonary scarring. 2. Bibasilar atelectasis and possible small right effusion. 3. No definite infiltrates or edema. Electronically Signed   By: Marijo Sanes M.D.   On: 11/16/2018 13:17   Dg Chest Port 1 View  Result Date: 11/12/2018 CLINICAL DATA:  73 year old female with history of shortness of breath. EXAM: PORTABLE CHEST 1 VIEW COMPARISON:  Chest x-ray 11/10/2018. FINDINGS: Right internal jugular PermCath with tips terminating in the right atrium and superior cavoatrial junction. Study is limited by severe patient rotation to the right. Atelectasis and/or consolidation in the right lower lobe. Left lung is clear. No pleural effusions. No evidence of pulmonary edema. Heart size appears normal. Aortic atherosclerosis. IMPRESSION: 1. New area of atelectasis and/or consolidation in the right lower lobe. 2. Aortic atherosclerosis. Electronically Signed   By: Vinnie Langton M.D.   On: 11/12/2018 18:25      Subjective:  No new complaints.  Discharge Exam: Vitals:   11/22/18 1430 11/22/18 1448  BP: (!) 98/54 113/61  Pulse: 88 87  Resp:  (!) 21  Temp:  97.8 F (36.6 C)  SpO2:  100%   Vitals:   11/22/18 1330 11/22/18 1400 11/22/18 1430 11/22/18 1448  BP: (!) 102/54 (!) 100/58 (!) 98/54 113/61  Pulse: 85 88 88 87  Resp:    (!) 21  Temp:    97.8 F (36.6 C)  TempSrc:    Oral  SpO2:    100%  Weight:    100.2 kg  Height:        General: Pt is alert, awake, not in acute distress Cardiovascular: RRR, S1/S2 +, no rubs, no gallops Respiratory: air entry fair,  Abdominal: Soft, NT, ND, bowel sounds + Extremities: no edema, no cyanosis    The results of significant diagnostics from this hospitalization (including imaging, microbiology, ancillary and laboratory) are listed below for reference.     Microbiology: No results found for this or any previous visit (from the  past 240 hour(s)).   Labs: BNP (last 3 results) Recent Labs    09/09/18 0031 11/10/18 1706  BNP 312.3* 123XX123*   Basic Metabolic Panel: Recent Labs  Lab 11/17/18 0728 11/19/18 0842  NA 137 140  K 5.1 4.3  CL 97* 99  CO2 24 23  GLUCOSE 247* 107*  BUN 63* 66*  CREATININE 7.20* 6.54*  CALCIUM 8.8* 8.7*  PHOS 8.7* 9.1*   Liver Function Tests: Recent Labs  Lab 11/17/18 0728 11/19/18 0842  ALBUMIN 2.9* 3.0*   No results for input(s): LIPASE, AMYLASE in the last 168 hours. No results for input(s): AMMONIA in the last 168 hours. CBC: Recent Labs  Lab 11/17/18 0728 11/19/18 0842  WBC 15.0* 12.4*  HGB 10.5* 10.7*  HCT 35.7* 35.0*  MCV 103.2* 98.6  PLT 146* 176   Cardiac Enzymes: No results for input(s): CKTOTAL, CKMB, CKMBINDEX, TROPONINI in the last 168 hours. BNP: Invalid input(s): POCBNP  CBG: Recent Labs  Lab 11/21/18 0732 11/21/18 1130 11/21/18 1602 11/21/18 2139 11/22/18 0807  GLUCAP 117* 214* 328* 174* 113*   D-Dimer No results for input(s): DDIMER in the last 72 hours. Hgb A1c No results for input(s): HGBA1C in the last 72 hours. Lipid Profile No results for input(s): CHOL, HDL, LDLCALC, TRIG, CHOLHDL, LDLDIRECT in the last 72 hours. Thyroid function studies No results for input(s): TSH, T4TOTAL, T3FREE, THYROIDAB in the last 72 hours.  Invalid input(s): FREET3 Anemia work up No results for input(s): VITAMINB12, FOLATE, FERRITIN, TIBC, IRON, RETICCTPCT in the last 72 hours. Urinalysis    Component Value Date/Time   COLORURINE YELLOW 04/20/2018 0301   APPEARANCEUR HAZY (A) 04/20/2018 0301   LABSPEC 1.009 04/20/2018 0301   PHURINE 9.0 (H) 04/20/2018 0301   GLUCOSEU 150 (A) 04/20/2018 0301   HGBUR MODERATE (A) 04/20/2018 0301   BILIRUBINUR NEGATIVE 04/20/2018 0301   KETONESUR NEGATIVE 04/20/2018 0301   PROTEINUR 100 (A) 04/20/2018 0301   NITRITE NEGATIVE 04/20/2018 0301   LEUKOCYTESUR NEGATIVE 04/20/2018 0301   Sepsis Labs Invalid  input(s): PROCALCITONIN,  WBC,  LACTICIDVEN Microbiology No results found for this or any previous visit (from the past 240 hour(s)).   Time coordinating discharge: 38 minutes  SIGNED:   Hosie Poisson, MD  [facility] 11/22/2018, 3:33 PM Pager   If 7PM-7AM, please contact night-coverage [email or web address] Password [email or web address]   Pt seen and examined at bedside.  No new changes to her discharge medications.    Hosie Poisson, MD  [phone]

## 2018-11-22 NOTE — Telephone Encounter (Signed)
Spoke with [name], she is the one requesting this letter--she needs this letter ASAP if possible to complete her paper work for food stamp.   Pt still in th hospital right now, not sure when she will get out and not sure if pt is going home or going to [facility] at home first. Pt will cal once she gets out of the hospital to set an appt up with [facility].

## 2018-11-22 NOTE — Telephone Encounter (Signed)
Pt's daughter is aware 

## 2018-11-23 ENCOUNTER — Telehealth: Payer: Self-pay | Admitting: *Deleted

## 2018-11-23 ENCOUNTER — Telehealth: Payer: Self-pay

## 2018-11-23 DIAGNOSIS — N186 End stage renal disease: Secondary | ICD-10-CM

## 2018-11-23 DIAGNOSIS — J449 Chronic obstructive pulmonary disease, unspecified: Secondary | ICD-10-CM

## 2018-11-23 DIAGNOSIS — J9621 Acute and chronic respiratory failure with hypoxia: Secondary | ICD-10-CM

## 2018-11-23 DIAGNOSIS — J690 Pneumonitis due to inhalation of food and vomit: Secondary | ICD-10-CM

## 2018-11-23 NOTE — Telephone Encounter (Signed)
Pt states she is back in the hospital.

## 2018-11-23 NOTE — Telephone Encounter (Signed)
Palliative Medicine RN Note: Rec'd call from son [name] asking about the purpose of sending pt to rehab. Joan Hambley was sent to [facility] yesterday; [name] states he likes to call that rehab. He asked about O2 needs, Trilogy usage, and what she would need to be able to go home oxygen-wise. I explained that I have limited chart access after the patient is discharged and recommended he call her team at the [facility] to discuss the plan they develop after their provider sees Joan Mosley. He verbalized understanding.  Marjie Skiff Keniah Klemmer, RN, BSN, [facility] Palliative Medicine Team 11/23/2018 9:53 AM Office [phone]

## 2018-11-24 DIAGNOSIS — N186 End stage renal disease: Secondary | ICD-10-CM

## 2018-11-24 DIAGNOSIS — J690 Pneumonitis due to inhalation of food and vomit: Secondary | ICD-10-CM

## 2018-11-24 DIAGNOSIS — J449 Chronic obstructive pulmonary disease, unspecified: Secondary | ICD-10-CM

## 2018-11-24 DIAGNOSIS — J9621 Acute and chronic respiratory failure with hypoxia: Secondary | ICD-10-CM

## 2018-11-25 DIAGNOSIS — N186 End stage renal disease: Secondary | ICD-10-CM

## 2018-11-25 DIAGNOSIS — J449 Chronic obstructive pulmonary disease, unspecified: Secondary | ICD-10-CM

## 2018-11-25 DIAGNOSIS — J690 Pneumonitis due to inhalation of food and vomit: Secondary | ICD-10-CM

## 2018-11-25 DIAGNOSIS — J9621 Acute and chronic respiratory failure with hypoxia: Secondary | ICD-10-CM

## 2018-11-26 DIAGNOSIS — J449 Chronic obstructive pulmonary disease, unspecified: Secondary | ICD-10-CM

## 2018-11-26 DIAGNOSIS — J690 Pneumonitis due to inhalation of food and vomit: Secondary | ICD-10-CM

## 2018-11-26 DIAGNOSIS — N186 End stage renal disease: Secondary | ICD-10-CM

## 2018-11-26 DIAGNOSIS — J9621 Acute and chronic respiratory failure with hypoxia: Secondary | ICD-10-CM

## 2018-11-27 DIAGNOSIS — J9621 Acute and chronic respiratory failure with hypoxia: Secondary | ICD-10-CM

## 2018-11-27 DIAGNOSIS — J690 Pneumonitis due to inhalation of food and vomit: Secondary | ICD-10-CM

## 2018-11-27 DIAGNOSIS — J449 Chronic obstructive pulmonary disease, unspecified: Secondary | ICD-10-CM

## 2018-11-27 DIAGNOSIS — N186 End stage renal disease: Secondary | ICD-10-CM

## 2018-12-05 DIAGNOSIS — J690 Pneumonitis due to inhalation of food and vomit: Secondary | ICD-10-CM

## 2018-12-05 DIAGNOSIS — J9621 Acute and chronic respiratory failure with hypoxia: Secondary | ICD-10-CM

## 2018-12-05 DIAGNOSIS — N186 End stage renal disease: Secondary | ICD-10-CM

## 2018-12-05 DIAGNOSIS — J449 Chronic obstructive pulmonary disease, unspecified: Secondary | ICD-10-CM

## 2018-12-06 DIAGNOSIS — N186 End stage renal disease: Secondary | ICD-10-CM

## 2018-12-06 DIAGNOSIS — J690 Pneumonitis due to inhalation of food and vomit: Secondary | ICD-10-CM

## 2018-12-06 DIAGNOSIS — J449 Chronic obstructive pulmonary disease, unspecified: Secondary | ICD-10-CM

## 2018-12-06 DIAGNOSIS — J9621 Acute and chronic respiratory failure with hypoxia: Secondary | ICD-10-CM

## 2018-12-07 DIAGNOSIS — J690 Pneumonitis due to inhalation of food and vomit: Secondary | ICD-10-CM

## 2018-12-07 DIAGNOSIS — N186 End stage renal disease: Secondary | ICD-10-CM

## 2018-12-07 DIAGNOSIS — J9621 Acute and chronic respiratory failure with hypoxia: Secondary | ICD-10-CM

## 2018-12-07 DIAGNOSIS — J449 Chronic obstructive pulmonary disease, unspecified: Secondary | ICD-10-CM

## 2018-12-08 DIAGNOSIS — J690 Pneumonitis due to inhalation of food and vomit: Secondary | ICD-10-CM

## 2018-12-08 DIAGNOSIS — N186 End stage renal disease: Secondary | ICD-10-CM

## 2018-12-08 DIAGNOSIS — J9621 Acute and chronic respiratory failure with hypoxia: Secondary | ICD-10-CM

## 2018-12-08 DIAGNOSIS — J449 Chronic obstructive pulmonary disease, unspecified: Secondary | ICD-10-CM

## 2018-12-09 DIAGNOSIS — N186 End stage renal disease: Secondary | ICD-10-CM

## 2018-12-09 DIAGNOSIS — J449 Chronic obstructive pulmonary disease, unspecified: Secondary | ICD-10-CM

## 2018-12-09 DIAGNOSIS — J9621 Acute and chronic respiratory failure with hypoxia: Secondary | ICD-10-CM

## 2018-12-09 DIAGNOSIS — J690 Pneumonitis due to inhalation of food and vomit: Secondary | ICD-10-CM

## 2018-12-10 ENCOUNTER — Other Ambulatory Visit: Payer: Self-pay | Admitting: Nurse Practitioner

## 2018-12-10 DIAGNOSIS — J449 Chronic obstructive pulmonary disease, unspecified: Secondary | ICD-10-CM

## 2018-12-10 DIAGNOSIS — J690 Pneumonitis due to inhalation of food and vomit: Secondary | ICD-10-CM

## 2018-12-10 DIAGNOSIS — N186 End stage renal disease: Secondary | ICD-10-CM

## 2018-12-10 DIAGNOSIS — J9621 Acute and chronic respiratory failure with hypoxia: Secondary | ICD-10-CM

## 2018-12-10 DIAGNOSIS — F419 Anxiety disorder, unspecified: Secondary | ICD-10-CM

## 2018-12-11 DIAGNOSIS — J690 Pneumonitis due to inhalation of food and vomit: Secondary | ICD-10-CM

## 2018-12-11 DIAGNOSIS — N186 End stage renal disease: Secondary | ICD-10-CM

## 2018-12-11 DIAGNOSIS — J449 Chronic obstructive pulmonary disease, unspecified: Secondary | ICD-10-CM

## 2018-12-11 DIAGNOSIS — J9621 Acute and chronic respiratory failure with hypoxia: Secondary | ICD-10-CM

## 2018-12-12 ENCOUNTER — Other Ambulatory Visit: Payer: Self-pay | Admitting: Nurse Practitioner

## 2018-12-12 DIAGNOSIS — F419 Anxiety disorder, unspecified: Secondary | ICD-10-CM

## 2018-12-13 ENCOUNTER — Telehealth: Payer: Self-pay | Admitting: Internal Medicine

## 2018-12-13 NOTE — Telephone Encounter (Signed)
Pt's son stated Mrs. Maggiore just got out from [facility] at home yesterday. Pt has been in the hospital and rehabilitation for almost a moth now and the pt is weak (unable to stand for long time) due to bed bound. Pt has to go to dialysis 3 times a week ( Tues,Thurs,Sat) and they need ambulance to transport her until she is able to get enough strength to use the bus. Dialysis doctor stated the order or form has be fill out by PCP to get insurance to pay for the ambulance transport to dialysis. Pt's son was wondering if they can do a virtual visit to get this done and then F2F once the pt able to come in.   Please advise.      Copied from [place] [id number]. Topic: General - Other >> Dec 13, 2018 12:03 PM Sheran Luz wrote: Patient's son requesting to speak with Laurey Arrow about mothers recent stay in rehab facility.

## 2018-12-14 ENCOUNTER — Ambulatory Visit (INDEPENDENT_AMBULATORY_CARE_PROVIDER_SITE_OTHER): Payer: Medicare Other | Admitting: Nurse Practitioner

## 2018-12-14 ENCOUNTER — Encounter: Payer: Self-pay | Admitting: Nurse Practitioner

## 2018-12-14 VITALS — BP 128/80 | HR 85 | Temp 97.6°F | Ht 61.0 in

## 2018-12-14 DIAGNOSIS — Z794 Long term (current) use of insulin: Secondary | ICD-10-CM

## 2018-12-14 DIAGNOSIS — E1142 Type 2 diabetes mellitus with diabetic polyneuropathy: Secondary | ICD-10-CM | POA: Diagnosis not present

## 2018-12-14 DIAGNOSIS — F419 Anxiety disorder, unspecified: Secondary | ICD-10-CM | POA: Diagnosis not present

## 2018-12-14 DIAGNOSIS — N186 End stage renal disease: Secondary | ICD-10-CM | POA: Diagnosis not present

## 2018-12-14 NOTE — Telephone Encounter (Signed)
Just called and left voice message to call back. The companies that provide non emergent transportation are [employer]  Chartered certified accountant. Is this what he is referring to? Ok to schedule virtual visit also.

## 2018-12-14 NOTE — Telephone Encounter (Signed)
LVM for the pt to call back, we still have some opening today

## 2018-12-14 NOTE — Telephone Encounter (Signed)
Form faxed [phone].

## 2018-12-14 NOTE — Progress Notes (Signed)
Virtual Visit via Video Note  I connected with Joan Mosley on 12/17/18 at  2:30 PM EST by a video enabled telemedicine application and verified that I am speaking with the correct person using two identifiers.  Location: Patient: home Provider: office  Son Joan Mosley) and daughter Joan Mosley) were also present during video call I discussed the limitations of evaluation and management by telemedicine and the availability of in person appointments. The patient expressed understanding and agreed to proceed.  CC: f/up after discharge from [facility] rehab 2days ago  History of Present Illness: Joan Mosley reports Fasting glucose in last 2days (157 and 152) Reports home  PT is to start tomorrow with [facility] services. Needs form completed for non emergent transportation to Dialysis center 3days/ week. [name] denies any acute compliant. She is scheduled to resume dialysis schedule Tues, Thurs, and Saturday   Observations/Objective: Alert and oriented to person, place, family and situation.  Klonopin used half tab in AM and 1tab in PM, last filled 10/14/18, #60 Physical Exam  Constitutional: No distress.  Pulmonary/Chest: Effort normal.  Neurological: She is alert.  Psychiatric: She has a normal mood and affect. Her behavior is normal.  Vitals reviewed.  Assessment and Plan: Joan Mosley was seen today for follow-up.  Diagnoses and all orders for this visit:  ESRD (end stage renal disease) ([place])  Anxiety  Type 2 diabetes mellitus with diabetic polyneuropathy, with long-term current use of insulin ([place])   Follow Up Instructions: See avs   I discussed the assessment and treatment plan with the patient. The patient was provided an opportunity to ask questions and all were answered. The patient agreed with the plan and demonstrated an understanding of the instructions.   The patient was advised to call back or seek an in-person evaluation if the symptoms worsen or if the condition fails to  improve as anticipated.  Wilfred Lacy, NP

## 2018-12-14 NOTE — Patient Instructions (Addendum)
Maintain upcoming appt. Call office with number of non emergency transportation company ASAP. Left Voice message for Joan Mosley (case manager, [phone]) to call back in regard to food stamp letter.  Joan Mosley returned my call. She states letter for food stamp is not needed. I informed Joan Mosley of above information.

## 2018-12-14 NOTE — Telephone Encounter (Signed)
Appt se at 230 today. Pt's son stated this is a private own company that provide non emergency transportation for pt that go to dialysis. He stated they have the form/or need written order form PCP to process with insurance company.

## 2018-12-15 ENCOUNTER — Telehealth: Payer: Self-pay | Admitting: Nurse Practitioner

## 2018-12-15 NOTE — Telephone Encounter (Signed)
Copied from [place] [id number]. Topic: General - Other >> Dec 15, 2018  1:22 PM Keene Breath wrote: Reason for CRM: Patient's daughter called to ask if the nurse or doctor could put in an order for a [employer] lift for the patient.  Patient is at dialysis now and they are requesting this to assist in lifting the patient.  Please advise and call daughter [name]) to discuss at [phone].

## 2018-12-15 NOTE — Telephone Encounter (Signed)
Sree with [facility] called in to update PCP. Pt declined homehealth today because she has another apt, pt would like for PT to come out tomorrow instead. Sree would like the okay for this?     CB: [phone]

## 2018-12-16 ENCOUNTER — Other Ambulatory Visit: Payer: Self-pay

## 2018-12-16 ENCOUNTER — Emergency Department (HOSPITAL_COMMUNITY)
Admission: EM | Admit: 2018-12-16 | Discharge: 2018-12-17 | Disposition: A | Discharge status: Home / Self Care | Payer: Medicare Other | Attending: Emergency Medicine | Admitting: Emergency Medicine

## 2018-12-16 ENCOUNTER — Telehealth: Payer: Self-pay | Admitting: Nurse Practitioner

## 2018-12-16 ENCOUNTER — Encounter (HOSPITAL_COMMUNITY): Payer: Self-pay | Admitting: Emergency Medicine

## 2018-12-16 DIAGNOSIS — Z79899 Other long term (current) drug therapy: Secondary | ICD-10-CM | POA: Insufficient documentation

## 2018-12-16 DIAGNOSIS — Z992 Dependence on renal dialysis: Secondary | ICD-10-CM | POA: Insufficient documentation

## 2018-12-16 DIAGNOSIS — Z794 Long term (current) use of insulin: Secondary | ICD-10-CM | POA: Diagnosis not present

## 2018-12-16 DIAGNOSIS — N186 End stage renal disease: Secondary | ICD-10-CM | POA: Diagnosis not present

## 2018-12-16 DIAGNOSIS — J449 Chronic obstructive pulmonary disease, unspecified: Secondary | ICD-10-CM | POA: Diagnosis not present

## 2018-12-16 DIAGNOSIS — I120 Hypertensive chronic kidney disease with stage 5 chronic kidney disease or end stage renal disease: Secondary | ICD-10-CM | POA: Insufficient documentation

## 2018-12-16 DIAGNOSIS — Z87891 Personal history of nicotine dependence: Secondary | ICD-10-CM | POA: Insufficient documentation

## 2018-12-16 DIAGNOSIS — E039 Hypothyroidism, unspecified: Secondary | ICD-10-CM | POA: Diagnosis not present

## 2018-12-16 DIAGNOSIS — E1122 Type 2 diabetes mellitus with diabetic chronic kidney disease: Secondary | ICD-10-CM | POA: Insufficient documentation

## 2018-12-16 LAB — BASIC METABOLIC PANEL
Anion gap: 21 — ABNORMAL HIGH (ref 5–15)
BUN: 76 mg/dL — ABNORMAL HIGH (ref 8–23)
CO2: 25 mmol/L (ref 22–32)
Calcium: 8.8 mg/dL — ABNORMAL LOW (ref 8.9–10.3)
Chloride: 89 mmol/L — ABNORMAL LOW (ref 98–111)
Creatinine, Ser: 9.71 mg/dL — ABNORMAL HIGH (ref 0.44–1.00)
GFR calc Af Amer: 4 mL/min — ABNORMAL LOW (ref >60)
GFR calc non Af Amer: 4 mL/min — ABNORMAL LOW (ref >60)
Glucose, Bld: 182 mg/dL — ABNORMAL HIGH (ref 70–99)
Potassium: 3.9 mmol/L (ref 3.5–5.1)
Sodium: 135 mmol/L (ref 135–145)

## 2018-12-16 LAB — CBC
HCT: 38.7 % (ref 36.0–46.0)
Hemoglobin: 12 g/dL (ref 12.0–15.0)
MCH: 30.2 pg (ref 26.0–34.0)
MCHC: 31 g/dL (ref 30.0–36.0)
MCV: 97.5 fL (ref 80.0–100.0)
Platelets: 130 10*3/uL — ABNORMAL LOW (ref 150–400)
RBC: 3.97 MIL/uL (ref 3.87–5.11)
RDW: 16.3 % — ABNORMAL HIGH (ref 11.5–15.5)
WBC: 9 10*3/uL (ref 4.0–10.5)
nRBC: 0 % (ref 0.0–0.2)

## 2018-12-16 NOTE — ED Triage Notes (Signed)
Pt arrives via EMS from home with needing HD. EMS reports pt was discharged recently and does not have a HD place set up. Last HD Tuesday.

## 2018-12-16 NOTE — ED Provider Notes (Signed)
[facility] EMERGENCY DEPARTMENT Provider Note   CSN: [id number] Arrival date & time: 12/16/18  1830     History   Chief Complaint Chief Complaint  Patient presents with   needs dialysis    HPI Joan Mosley is a 73 y.o. female with past medical history of COPD on 5 L of oxygen via nasal cannula at baseline, ESRD on dialysis, diabetes presenting to the ED requesting hemodialysis. She was admitted to the hospital from 11/10/2018 to 11/26/2018 4 acute on chronic respiratory failure with hypoxia.  She was discharged home to [facility].  She was receiving dialysis regularly up until 11/11/2018 when her current dialysis center refused to transport her from her wheelchair to the chair at the center.  She was told that she would have accommodations for a [employer] lift starting tomorrow to resume her T/Th/Sat schedule but was told to get dialysis done today.  Patient denies any complaints, she has been on her 5 L of oxygen at baseline, denies any leg swelling, shortness of breath, chest pain or vomiting.     HPI  Past Medical History:  Diagnosis Date   Arthritis    COPD (chronic obstructive pulmonary disease) ([place])    Diabetes mellitus without complication ([place])    Diabetic neuropathy ([place])    Oxygen dependent 03/30/2018   5L home O2   Renal disorder    ESRD   Thyroid disease    TIA (transient ischemic attack)     Patient Active Problem List   Diagnosis Date Noted   Aspiration pneumonia of right lower lobe ([place])    Palliative care encounter    COPD exacerbation ([place]) 11/11/2018   TIA (transient ischemic attack)    HLD (hyperlipidemia)    Type II diabetes mellitus with renal manifestations ([place])    Depression with anxiety    COPD with acute exacerbation ([place]) 09/30/2018   Acute respiratory failure with hypoxia ([place]) 09/24/2018   Advanced care planning/counseling discussion    Goals of care, counseling/discussion    Palliative care by  specialist    Acute on chronic respiratory failure with hypoxia and hypercapnia ([place]) 09/15/2018   Pulmonary edema 09/08/2018   Respiratory failure ([place]) 09/08/2018   Weakness    SDH (subdural hematoma) ([place]) 09/06/2018   Chronic respiratory failure with hypoxia, on home O2 therapy ([place]) 09/06/2018   Subdural hematoma (HCC) 08/22/2018   Elevated troponin 08/22/2018   ESRD (end stage renal disease) ([place]) 08/22/2018   Anxiety 07/04/2018   Counseling regarding advanced directives and goals of care 07/01/2018   Full code status 07/01/2018   Drug-induced constipation 07/01/2018   Hemodialysis-associated hypotension 07/01/2018   Dialysis patient ([place]) 07/01/2018   HOH (hard of hearing) 07/01/2018   Stage 5 chronic kidney disease on chronic dialysis ([place]) 07/01/2018   Type 2 diabetes mellitus with diabetic polyneuropathy, with long-term current use of insulin ([place]) 07/01/2018   Hypothyroidism 07/01/2018   Gait instability 07/01/2018   Hyperkalemia 04/20/2018    Past Surgical History:  Procedure Laterality Date   ABDOMINAL HYSTERECTOMY     IR FLUORO GUIDE CV LINE RIGHT  11/14/2018   RECTOCELE REPAIR     REPLACEMENT TOTAL KNEE BILATERAL Bilateral 2008   THYROIDECTOMY     80%   VAGINAL WOUND CLOSURE / REPAIR       OB History   No obstetric history on file.      Home Medications    Prior to Admission medications   Medication Sig Start Date End Date Taking?  Authorizing Provider  acetaminophen (TYLENOL) 500 MG tablet Take 1,000 mg by mouth every 6 (six) hours as needed for mild pain.    [provider]  albuterol (PROVENTIL) (2.5 MG/3ML) 0.083% nebulizer solution Take 3 mLs (2.5 mg total) by nebulization 4 (four) times daily. 09/16/18   Mariel Aloe, MD  albuterol (VENTOLIN HFA) 108 (90 Base) MCG/ACT inhaler Inhale 2 puffs into the lungs every 6 (six) hours as needed for wheezing or shortness of breath.    [provider]  aztreonam  0.5 g in dextrose 5 % 50 mL Inject 0.5 g into the vein every 12 (twelve) hours. 11/22/18   Hosie Poisson, MD  benzonatate (TESSALON) 200 MG capsule Take 1 capsule (200 mg total) by mouth 3 (three) times daily as needed for cough. 11/21/18   Hosie Poisson, MD  calcitRIOL (ROCALTROL) 0.25 MCG capsule Take 7 capsules (1.75 mcg total) by mouth Every Tuesday,Thursday,and Saturday with dialysis. 11/22/18   Hosie Poisson, MD  cinacalcet (SENSIPAR) 30 MG tablet Take 2 tablets (60 mg total) by mouth Every Tuesday,Thursday,and Saturday with dialysis. 11/22/18   Hosie Poisson, MD  clonazePAM (KLONOPIN) 1 MG tablet Take 1 mg by mouth 2 (two) times daily. 1/2 in Am and 1 tab at night    [provider]  Darbepoetin Alfa (ARANESP) 25 MCG/0.42ML SOSY injection Inject 0.42 mLs (25 mcg total) into the vein every Tuesday with hemodialysis. 11/22/18   Hosie Poisson, MD  docusate sodium (COLACE) 100 MG capsule Take 100 mg by mouth every morning.    [provider]  FLUoxetine (PROZAC) 10 MG tablet Take 1 tablet (10 mg total) by mouth daily. 08/08/18   Nche, Charlene Brooke, NP  furosemide (LASIX) 40 MG tablet Take 80 mg by mouth See admin instructions. Take 2 tablets (80 mg) by mouth every Sunday, Monday, Wednesday, Friday mornings (non-dialysis days) 10/13/18   [provider]  insulin detemir (LEVEMIR) 100 UNIT/ML injection Inject 0.1 mLs (10 Units total) into the skin daily. 11/22/18   Hosie Poisson, MD  insulin lispro (HUMALOG) 100 UNIT/ML injection Inject 1-5 Units into the skin 3 (three) times daily with meals as needed for high blood sugar (CBG >150). Sliding scale     [provider]  ipratropium (ATROVENT) 0.02 % nebulizer solution Inhale 0.5 mg into the lungs 4 (four) times daily.     [provider]  levothyroxine (SYNTHROID) 112 MCG tablet Take 1 tablet (112 mcg total) by mouth daily before breakfast. 1 AM Patient taking differently: Take 112 mcg by mouth daily before  breakfast.  07/04/18   Nche, Charlene Brooke, NP  lidocaine (LIDODERM) 5 % Place 1 patch onto the skin every 12 (twelve) hours as needed (back pain). Remove & Discard patch within 12 hours or as directed by MD     [provider]  midodrine (PROAMATINE) 10 MG tablet Take 10 mg by mouth See admin instructions. Take one tablet (10 mg) by mouth on Tuesday, Thursday, Saturday 45 minutes before dialysis    [provider]  montelukast (SINGULAIR) 10 MG tablet Take 10 mg by mouth at bedtime.     [provider]  multivitamin (RENA-VIT) TABS tablet Take 1 tablet by mouth at bedtime. 11/21/18   Hosie Poisson, MD  Nutritional Supplements (FEEDING SUPPLEMENT, NEPRO CARB STEADY,) LIQD Take 237 mLs by mouth 2 (two) times daily between meals. 11/22/18   Hosie Poisson, MD  nystatin (MYCOSTATIN/NYSTOP) powder Apply 1 g topically 2 (two) times daily as needed (skin  rash).     [provider]  nystatin cream (MYCOSTATIN) Apply 1 application topically 2 (two) times daily as needed for dry skin.     [provider]  rosuvastatin (CRESTOR) 40 MG tablet Take 40 mg by mouth every morning.     [provider]  senna (SENOKOT) 8.6 MG TABS tablet Take 1 tablet by mouth every morning.    [provider]  sevelamer carbonate (RENVELA) 800 MG tablet Take 2 tablets (1,600 mg total) by mouth 3 (three) times daily with meals. 11/21/18   Hosie Poisson, MD  vancomycin (VANCOCIN) 1-5 GM/200ML-% SOLN Inject 200 mLs (1,000 mg total) into the vein Every Tuesday,Thursday,and Saturday with dialysis. 11/22/18   Hosie Poisson, MD    Family History Family History  Problem Relation Age of Onset   Heart disease Mother    Hypertension Mother    Heart disease Father    Hypertension Father     Social History Social History   Tobacco Use   Smoking status: Former Smoker    Packs/day: 1.00    Years: 35.00    Pack years: 35.00    Quit date: 03/29/1981    Years since  quitting: 37.7   Smokeless tobacco: Never Used  Substance Use Topics   Alcohol use: Never    Frequency: Never   Drug use: Never     Allergies   Avelox [moxifloxacin hcl in nacl], Codeine, Penicillins, Sulfa antibiotics, Dextromethorphan-guaifenesin, and Shellfish allergy   Review of Systems Review of Systems  Constitutional: Negative for appetite change, chills and fever.  HENT: Negative for ear pain, rhinorrhea, sneezing and sore throat.   Eyes: Negative for photophobia and visual disturbance.  Respiratory: Negative for cough, chest tightness, shortness of breath and wheezing.   Cardiovascular: Negative for chest pain and palpitations.  Gastrointestinal: Negative for abdominal pain, blood in stool, constipation, diarrhea, nausea and vomiting.  Genitourinary: Negative for dysuria, hematuria and urgency.  Musculoskeletal: Negative for myalgias.  Skin: Negative for rash.  Neurological: Negative for dizziness, weakness and light-headedness.     Physical Exam Updated Vital Signs BP (!) 144/73    Pulse 80    Temp 97.8 F (36.6 C) (Oral)    Resp (!) 21    SpO2 93%   Physical Exam Vitals signs and nursing note reviewed.  Constitutional:      General: She is not in acute distress.    Appearance: She is well-developed. She is obese.     Comments: 5 L of oxygen being delivered via nasal cannula.  Speaking complete sentences without difficulty.  HENT:     Head: Normocephalic and atraumatic.     Nose: Nose normal.  Eyes:     General: No scleral icterus.       Left eye: No discharge.     Conjunctiva/sclera: Conjunctivae normal.  Neck:     Musculoskeletal: Normal range of motion and neck supple.  Cardiovascular:     Rate and Rhythm: Normal rate and regular rhythm.     Heart sounds: Normal heart sounds. No murmur. No friction rub. No gallop.   Pulmonary:     Effort: Pulmonary effort is normal. No respiratory distress.     Breath sounds: Normal breath sounds.  Abdominal:      General: Bowel sounds are normal. There is no distension.     Palpations: Abdomen is soft.     Tenderness: There is no abdominal tenderness. There is no guarding.  Musculoskeletal: Normal range of motion.  Skin:  General: Skin is warm and dry.     Findings: No rash.  Neurological:     Mental Status: She is alert.     Motor: No abnormal muscle tone.     Coordination: Coordination normal.      ED Treatments / Results  Labs (all labs ordered are listed, but only abnormal results are displayed) Labs Reviewed  CBC - Abnormal; Notable for the following components:      Result Value   RDW 16.3 (*)    Platelets 130 (*)    All other components within normal limits  BASIC METABOLIC PANEL - Abnormal; Notable for the following components:   Chloride 89 (*)    Glucose, Bld 182 (*)    BUN 76 (*)    Creatinine, Ser 9.71 (*)    Calcium 8.8 (*)    GFR calc non Af Amer 4 (*)    GFR calc Af Amer 4 (*)    Anion gap 21 (*)    All other components within normal limits    EKG EKG Interpretation  Date/Time:  Friday December 16 2018 18:54:31 EST Ventricular Rate:  77 PR Interval:  164 QRS Duration: 92 QT Interval:  402 QTC Calculation: 454 R Axis:   -17 Text Interpretation: Normal sinus rhythm Normal ECG No significant change since last tracing Confirmed by Wandra Arthurs [phone]) on 12/16/2018 8:16:54 PM   Radiology No results found.  Procedures Procedures (including critical care time)  Medications Ordered in ED Medications - No data to display   Initial Impression / Assessment and Plan / ED Course  I have reviewed the triage vital signs and the nursing notes.  Pertinent labs & imaging results that were available during my care of the patient were reviewed by me and considered in my medical decision making (see chart for details).        73 year old female presents to the ED at the request of her PCP for dialysis.  Patient was recently admitted, discharged to [facility] and  was on a MWF schedule while she was there.  At home she is usually on a TuThSa schedule for dialysis.  She presented to dialysis 4 days ago which was her last full dialysis session.  She was told to come to the ED for dialysis before resuming her regular dialysis tomorrow.  She denies any complaints today.  Unfortunately at this time of the day the dialysis coronary is unavailable.  I did speak to Dr. Moshe Cipro, nephrologist who will contact the patient's dialysis facility in the morning as she does not require urgent dialysis today based on her lab work and EKG.  Patient is agreeable to the plan.  Vital signs are within normal limits.  Patient is hemodynamically stable, in NAD, and able to ambulate in the ED. Evaluation does not show pathology that would require ongoing emergent intervention or inpatient treatment. I explained the diagnosis to the patient. Pain has been managed and has no complaints prior to discharge. Patient is comfortable with above plan and is stable for discharge at this time. All questions were answered prior to disposition. Strict return precautions for returning to the ED were discussed. Encouraged follow up with PCP.   An After Visit Summary was printed and given to the patient.   Portions of this note were generated with [employer]. Dictation errors may occur despite best attempts at proofreading.   Final Clinical Impressions(s) / ED Diagnoses   Final diagnoses:  ESRD on dialysis ([place])  ED Discharge Orders    None       Delia Heady, PA-C 12/16/18 2135    Drenda Freeze, MD 12/16/18 [phone]

## 2018-12-16 NOTE — Telephone Encounter (Signed)
Spoke with [name], she stated she needs a Acupuncturist order ASAP because they are trying to get Joan Mosley to dialysis tomorrow. [name] is unable to go transfer the pt from wheelchair to dialysis bed as of right now due to car issue and [name] is away. FYI--pt does not know where to send the order to for this pad, do we know which company provide this? FYI--pt didn't get the dialysis treatment yesterday due to this issue.   They also request order for [facility] Lift to be use at home--Please advise.

## 2018-12-16 NOTE — Telephone Encounter (Signed)
Medication: clonazePAM (KLONOPIN) 1 MG tablet [id number]   Has the patient contacted their pharmacy? Yes  (Agent: If no, request that the patient contact the pharmacy for the refill.) (Agent: If yes, when and what did the pharmacy advise?)  Preferred Pharmacy (with phone number or street name): [facility] ([address] [place] [id number] (Phone) [phone] (Fax)    Agent: Please be advised that RX refills may take up to 3 business days. We ask that you follow-up with your pharmacy.

## 2018-12-16 NOTE — Telephone Encounter (Signed)
[name] is not listed as person of contact. Joan Mosley talked to Joan Mosley who authorized for her medical information to be discussed with her immediate caregivers Joan Mosley and Joan Mosley). Please have him to talk to his sister and brother about his requests. I have left a voice message on Joan Mosley's phone to call back: [phone]

## 2018-12-16 NOTE — Telephone Encounter (Signed)
[name] please advise 

## 2018-12-16 NOTE — ED Notes (Signed)
CALLED [facility] FOR TRANSPORT HOME--Joan Mosley  

## 2018-12-16 NOTE — Discharge Instructions (Addendum)
I spoke to the on-call nephrologist who will contact your dialysis center tomorrow for you to resume your regularly scheduled dialysis session. Continue your other home medications as previously prescribed. Return to the ED if you start to have worsening symptoms, chest pain, shortness of breath, injuries or falls.

## 2018-12-16 NOTE — Telephone Encounter (Signed)
Hold on refill for now. She will be going back to the hospital

## 2018-12-16 NOTE — Telephone Encounter (Signed)
duplicate

## 2018-12-16 NOTE — Telephone Encounter (Signed)
Patients son, [name] is calling to check on the status of the orders for [employer] and an ambulance for medical transportation to and from Dialysis.   Patient's son [name] is wanting to know the name of the company that the orders will be sent to. Please advise [phone]

## 2018-12-16 NOTE — Telephone Encounter (Signed)
Ms. Joan Mosley recognized Joan Mosley as her son. She also agreed for me to share her medical information with Joan Mosley. Joan Mosley states he is located at [facility]. Ms. Joan Mosley is at home alone at this time Joan Mosley) Joan Mosley informed me that he has HCPOA for Joan Mosley. I asked him to fax form to [phone]. Joan Mosley contact this office this morning to inform us that his mother was not able to get dialysis yesterday because the staff could not transfer her from wheelchair to dialysis chair. He is requesting to [facility] lift pad which can be used at dialysis center to transport Joan Mosley.  He wanted to know if non-emergent ambulance transportation form was completed and faxed. I informed Joan Mosley and Joan Mosley that the form was completed and faxed 2days ago to [facility]. [facility] faxed form again today and confirmed with [facility] personnel that form was received. We contacted Joan Mosley (daughter at home with Joan Mosley) this morning. She stated she has no means of transportation at this time. So I asked how he plans to get Joan Mosley lift pad in time for dialysis tomorrow? Joan Mosley stated he will have pad delivered by [employer] but he is unsure how soon it can be [facility]. I then contact [place]: Joan Mosley to inquire what options were available to ensure Joan Mosley gets dialyzed tomorrow. Joan Mosley (office manager) informed me that Joan Mosley has not had any dialysis this week, hence she is considered unstable and can not be dialysed at the center tomorrow. She recommended for Joan Mosley to be taken to [facility] for dialysis and monitoring. I relayed above information to Joan Mosley. He asked if he could wait till tomorrow morning to take her. I advised against that due to the risk of respiratory distress and cardiac arrest. He verbalized understanding and agreed to make arrangements for Joan Mosley to be transported to [facility] ED today. I advised him to also ask for social worker consult while they are in the [place] to help coordinate  transportation to dialysis once she is discharged. He verbalized understanding.

## 2018-12-16 NOTE — Telephone Encounter (Signed)
Please advise, this rx discontinue 11/22/2018.

## 2018-12-17 ENCOUNTER — Encounter: Payer: Self-pay | Admitting: Nurse Practitioner

## 2018-12-17 NOTE — ED Notes (Signed)
Patient verbalizes understanding of discharge instructions. Opportunity for questioning and answers were provided. Armband removed by staff, pt discharged from ED.  

## 2018-12-20 ENCOUNTER — Telehealth: Payer: Self-pay | Admitting: Nurse Practitioner

## 2018-12-20 NOTE — Telephone Encounter (Signed)
LVM for Sree to call back, need to inform Baldo Ash message and ask about social worker?

## 2018-12-20 NOTE — Telephone Encounter (Signed)
Debbie calling from [place] called and stated that pt missed social work visit today   [employer]

## 2018-12-20 NOTE — Telephone Encounter (Signed)
Ok Also ask if the provide social worker consult?

## 2018-12-20 NOTE — Telephone Encounter (Signed)
Pt has Education officer, [employer] already.

## 2018-12-20 NOTE — Telephone Encounter (Signed)
Spoke with [employer] verbal order (per Nche) for social worker to go back next week. Debbie mention that the nurse will go visit on Friday.   FYI--pt is weak from dialysis yesterday and request for social worker to go back next week instead.

## 2018-12-20 NOTE — Telephone Encounter (Signed)
Joan Mosley please advise 

## 2018-12-21 ENCOUNTER — Telehealth: Payer: Self-pay | Admitting: Nurse Practitioner

## 2018-12-21 ENCOUNTER — Other Ambulatory Visit: Payer: Self-pay | Admitting: Nurse Practitioner

## 2018-12-21 NOTE — Telephone Encounter (Signed)
Medication Refill - Medication: clonazePAM (KLONOPIN) 1 MG tablet  Has the patient contacted their pharmacy? Yes.   (Agent: If no, request that the patient contact the pharmacy for the refill.) (Agent: If yes, when and what did the pharmacy advise?)  Preferred Pharmacy (with phone number or street name):  [facility] ([address]), [place] [id number] (Phone) [phone] (Fax     Agent: Please be advised that RX refills may take up to 3 business days. We ask that you follow-up with your pharmacy.

## 2018-12-21 NOTE — Telephone Encounter (Signed)
Klonopin prescription was written in July and June, but they were never filled at her pharmacy. Last and only prescription filled was 10/14/2018 by another provider. I need to know if this prescriptions were ever filled. If yes, which pharmacy filled them?

## 2018-12-21 NOTE — Telephone Encounter (Signed)
Requested medication (s) are due for refill today: yes  Requested medication (s) are on the active medication list: yes    Future visit scheduled: yes  Notes to clinic:  Refill cannot be delegated    Requested Prescriptions  Pending Prescriptions Disp Refills   clonazePAM (KLONOPIN) 1 MG tablet 30 tablet     Sig: Take 1 tablet (1 mg total) by mouth 2 (two) times daily. 1/2 in Am and 1 tab at night     Not Delegated - Psychiatry:  Anxiolytics/Hypnotics Failed - 12/21/2018  1:38 PM      Failed - This refill cannot be delegated      Failed - Urine Drug Screen completed in last 360 days.      Passed - Valid encounter within last 6 months    Recent Outpatient Visits          1 week ago ESRD (end stage renal disease) ([place])   [facility], [place], NP   2 months ago Type 2 diabetes mellitus with diabetic polyneuropathy, with long-term current use of insulin ([place])   [facility], Charlene Brooke, NP   4 months ago Anxiety   [facility], [place], NP   5 months ago Type 2 diabetes mellitus with diabetic polyneuropathy, with long-term current use of insulin ([place])   [facility], Charlene Brooke, NP      Future Appointments            In 3 weeks Nche, Charlene Brooke, NP [facility] [facility], [place]

## 2018-12-24 ENCOUNTER — Inpatient Hospital Stay (HOSPITAL_COMMUNITY)
Admission: EM | Admit: 2018-12-24 | Discharge: 2018-12-27 | DRG: 312 | Disposition: A | Discharge status: Home Health | Payer: Medicare Other | Attending: Internal Medicine | Admitting: Internal Medicine

## 2018-12-24 ENCOUNTER — Emergency Department (HOSPITAL_COMMUNITY): Payer: Medicare Other

## 2018-12-24 ENCOUNTER — Other Ambulatory Visit: Payer: Self-pay

## 2018-12-24 ENCOUNTER — Encounter (HOSPITAL_COMMUNITY): Payer: Self-pay

## 2018-12-24 DIAGNOSIS — Z66 Do not resuscitate: Secondary | ICD-10-CM | POA: Diagnosis present

## 2018-12-24 DIAGNOSIS — R1313 Dysphagia, pharyngeal phase: Secondary | ICD-10-CM | POA: Diagnosis not present

## 2018-12-24 DIAGNOSIS — G9340 Encephalopathy, unspecified: Secondary | ICD-10-CM | POA: Diagnosis present

## 2018-12-24 DIAGNOSIS — N186 End stage renal disease: Secondary | ICD-10-CM | POA: Diagnosis present

## 2018-12-24 DIAGNOSIS — E039 Hypothyroidism, unspecified: Secondary | ICD-10-CM | POA: Diagnosis present

## 2018-12-24 DIAGNOSIS — Z96653 Presence of artificial knee joint, bilateral: Secondary | ICD-10-CM | POA: Diagnosis present

## 2018-12-24 DIAGNOSIS — A419 Sepsis, unspecified organism: Secondary | ICD-10-CM | POA: Diagnosis not present

## 2018-12-24 DIAGNOSIS — Z888 Allergy status to other drugs, medicaments and biological substances status: Secondary | ICD-10-CM

## 2018-12-24 DIAGNOSIS — F329 Major depressive disorder, single episode, unspecified: Secondary | ICD-10-CM | POA: Diagnosis present

## 2018-12-24 DIAGNOSIS — R5381 Other malaise: Secondary | ICD-10-CM | POA: Diagnosis not present

## 2018-12-24 DIAGNOSIS — R4182 Altered mental status, unspecified: Secondary | ICD-10-CM | POA: Diagnosis present

## 2018-12-24 DIAGNOSIS — D631 Anemia in chronic kidney disease: Secondary | ICD-10-CM | POA: Diagnosis present

## 2018-12-24 DIAGNOSIS — J449 Chronic obstructive pulmonary disease, unspecified: Secondary | ICD-10-CM | POA: Diagnosis present

## 2018-12-24 DIAGNOSIS — Z880 Allergy status to penicillin: Secondary | ICD-10-CM | POA: Diagnosis not present

## 2018-12-24 DIAGNOSIS — I959 Hypotension, unspecified: Secondary | ICD-10-CM | POA: Diagnosis present

## 2018-12-24 DIAGNOSIS — E1140 Type 2 diabetes mellitus with diabetic neuropathy, unspecified: Secondary | ICD-10-CM | POA: Diagnosis present

## 2018-12-24 DIAGNOSIS — N2581 Secondary hyperparathyroidism of renal origin: Secondary | ICD-10-CM | POA: Diagnosis present

## 2018-12-24 DIAGNOSIS — F419 Anxiety disorder, unspecified: Secondary | ICD-10-CM | POA: Diagnosis not present

## 2018-12-24 DIAGNOSIS — J9610 Chronic respiratory failure, unspecified whether with hypoxia or hypercapnia: Secondary | ICD-10-CM | POA: Diagnosis present

## 2018-12-24 DIAGNOSIS — Z794 Long term (current) use of insulin: Secondary | ICD-10-CM | POA: Diagnosis not present

## 2018-12-24 DIAGNOSIS — R1310 Dysphagia, unspecified: Secondary | ICD-10-CM | POA: Diagnosis not present

## 2018-12-24 DIAGNOSIS — Z8249 Family history of ischemic heart disease and other diseases of the circulatory system: Secondary | ICD-10-CM | POA: Diagnosis not present

## 2018-12-24 DIAGNOSIS — M1990 Unspecified osteoarthritis, unspecified site: Secondary | ICD-10-CM | POA: Diagnosis present

## 2018-12-24 DIAGNOSIS — K5730 Diverticulosis of large intestine without perforation or abscess without bleeding: Secondary | ICD-10-CM | POA: Diagnosis present

## 2018-12-24 DIAGNOSIS — M898X9 Other specified disorders of bone, unspecified site: Secondary | ICD-10-CM | POA: Diagnosis present

## 2018-12-24 DIAGNOSIS — Z992 Dependence on renal dialysis: Secondary | ICD-10-CM

## 2018-12-24 DIAGNOSIS — Z9981 Dependence on supplemental oxygen: Secondary | ICD-10-CM | POA: Diagnosis not present

## 2018-12-24 DIAGNOSIS — Z79890 Hormone replacement therapy (postmenopausal): Secondary | ICD-10-CM | POA: Diagnosis not present

## 2018-12-24 DIAGNOSIS — E119 Type 2 diabetes mellitus without complications: Secondary | ICD-10-CM | POA: Diagnosis not present

## 2018-12-24 DIAGNOSIS — Z882 Allergy status to sulfonamides status: Secondary | ICD-10-CM

## 2018-12-24 DIAGNOSIS — R6521 Severe sepsis with septic shock: Secondary | ICD-10-CM | POA: Diagnosis not present

## 2018-12-24 DIAGNOSIS — R6520 Severe sepsis without septic shock: Secondary | ICD-10-CM

## 2018-12-24 DIAGNOSIS — Z87891 Personal history of nicotine dependence: Secondary | ICD-10-CM | POA: Diagnosis not present

## 2018-12-24 DIAGNOSIS — E1122 Type 2 diabetes mellitus with diabetic chronic kidney disease: Secondary | ICD-10-CM | POA: Diagnosis present

## 2018-12-24 DIAGNOSIS — I120 Hypertensive chronic kidney disease with stage 5 chronic kidney disease or end stage renal disease: Secondary | ICD-10-CM | POA: Diagnosis present

## 2018-12-24 DIAGNOSIS — Z885 Allergy status to narcotic agent status: Secondary | ICD-10-CM

## 2018-12-24 DIAGNOSIS — Z20828 Contact with and (suspected) exposure to other viral communicable diseases: Secondary | ICD-10-CM | POA: Diagnosis present

## 2018-12-24 DIAGNOSIS — Z8673 Personal history of transient ischemic attack (TIA), and cerebral infarction without residual deficits: Secondary | ICD-10-CM

## 2018-12-24 DIAGNOSIS — I953 Hypotension of hemodialysis: Secondary | ICD-10-CM | POA: Diagnosis present

## 2018-12-24 DIAGNOSIS — Z91013 Allergy to seafood: Secondary | ICD-10-CM

## 2018-12-24 LAB — CBC WITH DIFFERENTIAL/PLATELET
Abs Immature Granulocytes: 0.53 10*3/uL — ABNORMAL HIGH (ref 0.00–0.07)
Basophils Absolute: 0.1 10*3/uL (ref 0.0–0.1)
Basophils Relative: 0 %
Eosinophils Absolute: 0 10*3/uL (ref 0.0–0.5)
Eosinophils Relative: 0 %
HCT: 44.9 % (ref 36.0–46.0)
Hemoglobin: 13.5 g/dL (ref 12.0–15.0)
Immature Granulocytes: 3 %
Lymphocytes Relative: 12 %
Lymphs Abs: 2.4 10*3/uL (ref 0.7–4.0)
MCH: 30.1 pg (ref 26.0–34.0)
MCHC: 30.1 g/dL (ref 30.0–36.0)
MCV: 100.2 fL — ABNORMAL HIGH (ref 80.0–100.0)
Monocytes Absolute: 0.7 10*3/uL (ref 0.1–1.0)
Monocytes Relative: 4 %
Neutro Abs: 15.6 10*3/uL — ABNORMAL HIGH (ref 1.7–7.7)
Neutrophils Relative %: 81 %
Platelets: 161 10*3/uL (ref 150–400)
RBC: 4.48 MIL/uL (ref 3.87–5.11)
RDW: 16.7 % — ABNORMAL HIGH (ref 11.5–15.5)
WBC: 19.3 10*3/uL — ABNORMAL HIGH (ref 4.0–10.5)
nRBC: 0.3 % — ABNORMAL HIGH (ref 0.0–0.2)

## 2018-12-24 LAB — POCT I-STAT EG7
Acid-base deficit: 8 mmol/L — ABNORMAL HIGH (ref 0.0–2.0)
Bicarbonate: 21 mmol/L (ref 20.0–28.0)
Calcium, Ion: 0.91 mmol/L — ABNORMAL LOW (ref 1.15–1.40)
HCT: 44 % (ref 36.0–46.0)
Hemoglobin: 15 g/dL (ref 12.0–15.0)
O2 Saturation: 55 %
Potassium: 4 mmol/L (ref 3.5–5.1)
Sodium: 132 mmol/L — ABNORMAL LOW (ref 135–145)
TCO2: 23 mmol/L (ref 22–32)
pCO2, Ven: 56.7 mmHg (ref 44.0–60.0)
pH, Ven: 7.178 — CL (ref 7.250–7.430)
pO2, Ven: 37 mmHg (ref 32.0–45.0)

## 2018-12-24 LAB — COMPREHENSIVE METABOLIC PANEL
ALT: 19 U/L (ref 0–44)
AST: 28 U/L (ref 15–41)
Albumin: 3.5 g/dL (ref 3.5–5.0)
Alkaline Phosphatase: 111 U/L (ref 38–126)
Anion gap: 23 — ABNORMAL HIGH (ref 5–15)
BUN: 11 mg/dL (ref 8–23)
CO2: 20 mmol/L — ABNORMAL LOW (ref 22–32)
Calcium: 8.2 mg/dL — ABNORMAL LOW (ref 8.9–10.3)
Chloride: 92 mmol/L — ABNORMAL LOW (ref 98–111)
Creatinine, Ser: 3.7 mg/dL — ABNORMAL HIGH (ref 0.44–1.00)
GFR calc Af Amer: 13 mL/min — ABNORMAL LOW (ref >60)
GFR calc non Af Amer: 11 mL/min — ABNORMAL LOW (ref >60)
Glucose, Bld: 205 mg/dL — ABNORMAL HIGH (ref 70–99)
Potassium: 4.2 mmol/L (ref 3.5–5.1)
Sodium: 135 mmol/L (ref 135–145)
Total Bilirubin: 1 mg/dL (ref 0.3–1.2)
Total Protein: 7.2 g/dL (ref 6.5–8.1)

## 2018-12-24 LAB — I-STAT CHEM 8, ED
BUN: 13 mg/dL (ref 8–23)
Calcium, Ion: 0.92 mmol/L — ABNORMAL LOW (ref 1.15–1.40)
Chloride: 95 mmol/L — ABNORMAL LOW (ref 98–111)
Creatinine, Ser: 3.4 mg/dL — ABNORMAL HIGH (ref 0.44–1.00)
Glucose, Bld: 198 mg/dL — ABNORMAL HIGH (ref 70–99)
HCT: 46 % (ref 36.0–46.0)
Hemoglobin: 15.6 g/dL — ABNORMAL HIGH (ref 12.0–15.0)
Potassium: 4 mmol/L (ref 3.5–5.1)
Sodium: 132 mmol/L — ABNORMAL LOW (ref 135–145)
TCO2: 22 mmol/L (ref 22–32)

## 2018-12-24 LAB — LIPASE, BLOOD: Lipase: 40 U/L (ref 11–51)

## 2018-12-24 LAB — SARS CORONAVIRUS 2 BY RT PCR (HOSPITAL ORDER, PERFORMED IN ~~LOC~~ HOSPITAL LAB): SARS Coronavirus 2: NEGATIVE

## 2018-12-24 LAB — CBG MONITORING, ED
Glucose-Capillary: 136 mg/dL — ABNORMAL HIGH (ref 70–99)
Glucose-Capillary: 132 mg/dL — ABNORMAL HIGH (ref 70–99)

## 2018-12-24 LAB — POC SARS CORONAVIRUS 2 AG -  ED: SARS Coronavirus 2 Ag: NEGATIVE

## 2018-12-24 LAB — LACTIC ACID, PLASMA: Lactic Acid, Venous: 1.2 mmol/L (ref 0.5–1.9)

## 2018-12-24 LAB — VANCOMYCIN, RANDOM: Vancomycin Rm: <4

## 2018-12-24 LAB — SARS CORONAVIRUS 2 (TAT 6-24 HRS): SARS Coronavirus 2: NEGATIVE

## 2018-12-24 LAB — PROCALCITONIN: Procalcitonin: 3.28 ng/mL

## 2018-12-24 MED ORDER — INSULIN DETEMIR 100 UNIT/ML ~~LOC~~ SOLN
15.0000 [IU] | Freq: Every day | SUBCUTANEOUS | Status: DC
  Filled 2018-12-24 (×4): qty 0.15

## 2018-12-24 MED ORDER — VANCOMYCIN HCL IN DEXTROSE 1-5 GM/200ML-% IV SOLN
1000.0000 mg | INTRAVENOUS | Status: DC
  Administered 2018-12-27: 1000 mg via INTRAVENOUS
  Filled 2018-12-24: qty 200

## 2018-12-24 MED ORDER — CLONAZEPAM 1 MG PO TABS
1.0000 mg | ORAL_TABLET | Freq: Every day | ORAL | Status: DC
  Administered 2018-12-24: 1 mg via ORAL
  Filled 2018-12-24: qty 2

## 2018-12-24 MED ORDER — NOREPINEPHRINE 4 MG/250ML-% IV SOLN
0.0000 ug/min | INTRAVENOUS | Status: DC
  Administered 2018-12-24: 18:00:00 2 ug/min via INTRAVENOUS
  Filled 2018-12-24: qty 250

## 2018-12-24 MED ORDER — IPRATROPIUM-ALBUTEROL 0.5-2.5 (3) MG/3ML IN SOLN: 3.0000 mL | RESPIRATORY_TRACT | Status: DC

## 2018-12-24 MED ORDER — INSULIN ASPART 100 UNIT/ML ~~LOC~~ SOLN: 0.0000 [IU] | Freq: Every day | SUBCUTANEOUS | Status: DC

## 2018-12-24 MED ORDER — FLUOXETINE HCL 10 MG PO CAPS
10.0000 mg | ORAL_CAPSULE | Freq: Every day | ORAL | Status: DC
  Administered 2018-12-26 – 2018-12-27 (×2): 10 mg via ORAL
  Filled 2018-12-24 (×3): qty 1

## 2018-12-24 MED ORDER — INSULIN ASPART 100 UNIT/ML ~~LOC~~ SOLN: 0.0000 [IU] | Freq: Three times a day (TID) | SUBCUTANEOUS | Status: DC

## 2018-12-24 MED ORDER — MONTELUKAST SODIUM 10 MG PO TABS
10.0000 mg | ORAL_TABLET | Freq: Every day | ORAL | Status: DC
  Administered 2018-12-25 – 2018-12-27 (×3): 10 mg via ORAL
  Filled 2018-12-24 (×3): qty 1

## 2018-12-24 MED ORDER — IPRATROPIUM-ALBUTEROL 0.5-2.5 (3) MG/3ML IN SOLN
3.0000 mL | RESPIRATORY_TRACT | Status: DC
  Administered 2018-12-24: 23:00:00 3 mL via RESPIRATORY_TRACT
  Filled 2018-12-24: qty 3

## 2018-12-24 MED ORDER — CLONAZEPAM 0.5 MG PO TABS: 0.5000 mg | ORAL_TABLET | Freq: Every day | ORAL | Status: DC

## 2018-12-24 MED ORDER — SEVELAMER CARBONATE 800 MG PO TABS
1600.0000 mg | ORAL_TABLET | Freq: Three times a day (TID) | ORAL | Status: DC
  Administered 2018-12-25 – 2018-12-27 (×6): 1600 mg via ORAL
  Filled 2018-12-24 (×8): qty 2

## 2018-12-24 MED ORDER — SODIUM CHLORIDE 0.9 % IV SOLN: INTRAVENOUS | Status: DC | PRN

## 2018-12-24 MED ORDER — LEVALBUTEROL HCL 0.63 MG/3ML IN NEBU: 0.6300 mg | INHALATION_SOLUTION | Freq: Four times a day (QID) | RESPIRATORY_TRACT | Status: DC | PRN

## 2018-12-24 MED ORDER — ALBUTEROL SULFATE (2.5 MG/3ML) 0.083% IN NEBU: 2.5000 mg | INHALATION_SOLUTION | Freq: Four times a day (QID) | RESPIRATORY_TRACT | Status: DC

## 2018-12-24 MED ORDER — LEVOTHYROXINE SODIUM 112 MCG PO TABS
112.0000 ug | ORAL_TABLET | Freq: Every day | ORAL | Status: DC
  Administered 2018-12-26: 112 ug via ORAL
  Filled 2018-12-24 (×2): qty 1

## 2018-12-24 MED ORDER — HEPARIN SODIUM (PORCINE) 5000 UNIT/ML IJ SOLN
5000.0000 [IU] | Freq: Three times a day (TID) | INTRAMUSCULAR | Status: DC
  Administered 2018-12-24 – 2018-12-27 (×9): 5000 [IU] via SUBCUTANEOUS
  Filled 2018-12-24 (×9): qty 1

## 2018-12-24 MED ORDER — FENTANYL CITRATE (PF) 100 MCG/2ML IJ SOLN: 12.5000 ug | INTRAMUSCULAR | Status: DC | PRN

## 2018-12-24 MED ORDER — DEXTROSE 5 % IV SOLN
0.5000 g | Freq: Two times a day (BID) | INTRAVENOUS | Status: DC
  Administered 2018-12-24 – 2018-12-27 (×6): 0.5 g via INTRAVENOUS
  Filled 2018-12-24 (×9): qty 0.5

## 2018-12-24 MED ORDER — VANCOMYCIN HCL 10 G IV SOLR
2000.0000 mg | Freq: Once | INTRAVENOUS | Status: AC
  Administered 2018-12-24: 20:00:00 2000 mg via INTRAVENOUS
  Filled 2018-12-24: qty 2000

## 2018-12-24 MED ORDER — DOCUSATE SODIUM 100 MG PO CAPS
100.0000 mg | ORAL_CAPSULE | Freq: Every morning | ORAL | Status: DC
  Administered 2018-12-26 – 2018-12-27 (×2): 100 mg via ORAL
  Filled 2018-12-24 (×2): qty 1

## 2018-12-24 NOTE — ED Notes (Signed)
Per EDP verbal order; BP MAP goal of 60 with levophed

## 2018-12-24 NOTE — ED Provider Notes (Signed)
[facility] EMERGENCY DEPARTMENT Provider Note   CSN: [id number] Arrival date & time: 12/24/18  1642     History   Chief Complaint Chief Complaint  Patient presents with   Altered Mental Status    HPI Joan Mosley is a 73 y.o. female.     The history is provided by the patient and medical records. No language interpreter was used.   Joan Mosley is a 73 y.o. female who presents to the Emergency Department complaining of AMS.  She presents to the ED for evaluation of AMS by EMS.  Level V caveat due to AMS.  She was receiving her dialysis when she became hypotensive, BP in the 60s with decreased LOC.  She received 1L fluid bolus, brief CPR.    Past Medical History:  Diagnosis Date   Arthritis    COPD (chronic obstructive pulmonary disease) ([place])    Diabetes mellitus without complication ([place])    Diabetic neuropathy ([place])    Oxygen dependent 03/30/2018   5L home O2   Renal disorder    ESRD   Thyroid disease    TIA (transient ischemic attack)     Patient Active Problem List   Diagnosis Date Noted   Hypotension 12/24/2018   Aspiration pneumonia of right lower lobe ([place])    Palliative care encounter    COPD exacerbation ([place]) 11/11/2018   TIA (transient ischemic attack)    HLD (hyperlipidemia)    Type II diabetes mellitus with renal manifestations ([place])    Depression with anxiety    COPD with acute exacerbation ([place]) 09/30/2018   Acute respiratory failure with hypoxia ([place]) 09/24/2018   Advanced care planning/counseling discussion    Goals of care, counseling/discussion    Palliative care by specialist    Acute on chronic respiratory failure with hypoxia and hypercapnia ([place]) 09/15/2018   Pulmonary edema 09/08/2018   Respiratory failure ([place]) 09/08/2018   Weakness    SDH (subdural hematoma) ([place]) 09/06/2018   Chronic respiratory failure with hypoxia, on home O2 therapy ([place]) 09/06/2018   Subdural hematoma (H[facility])  08/22/2018   Elevated troponin 08/22/2018   ESRD (end stage renal disease) ([place]) 08/22/2018   Anxiety 07/04/2018   Counseling regarding advanced directives and goals of care 07/01/2018   Full code status 07/01/2018   Drug-induced constipation 07/01/2018   Hemodialysis-associated hypotension 07/01/2018   Dialysis patient ([place]) 07/01/2018   HOH (hard of hearing) 07/01/2018   Stage 5 chronic kidney disease on chronic dialysis ([place]) 07/01/2018   Type 2 diabetes mellitus with diabetic polyneuropathy, with long-term current use of insulin ([place]) 07/01/2018   Hypothyroidism 07/01/2018   Gait instability 07/01/2018   Hyperkalemia 04/20/2018    Past Surgical History:  Procedure Laterality Date   ABDOMINAL HYSTERECTOMY     IR FLUORO GUIDE CV LINE RIGHT  11/14/2018   RECTOCELE REPAIR     REPLACEMENT TOTAL KNEE BILATERAL Bilateral 2008   THYROIDECTOMY     80%   VAGINAL WOUND CLOSURE / REPAIR       OB History   No obstetric history on file.      Home Medications    Prior to Admission medications   Medication Sig Start Date End Date Taking? Authorizing Provider  acetaminophen (TYLENOL) 500 MG tablet Take 1,000 mg by mouth every 6 (six) hours as needed (for arthritic pain).    Yes [provider]  clonazePAM (KLONOPIN) 1 MG tablet Take 0.5-1 mg by mouth See admin instructions. Take 0.5-1 mg by mouth in the  morning and 1 mg at bedtime   Yes [provider]  docusate sodium (COLACE) 100 MG capsule Take 100 mg by mouth every morning.   Yes [provider]  FLUoxetine (PROZAC) 10 MG tablet Take 1 tablet (10 mg total) by mouth daily. 08/08/18  Yes Nche, Charlene Brooke, NP  furosemide (LASIX) 40 MG tablet Take 80 mg by mouth See admin instructions. Take 80 mg by mouth in the morning on Sun/Mon/Wed/Fri (non-dialysis days) 10/13/18  Yes [provider]  insulin detemir (LEVEMIR) 100 UNIT/ML injection Inject 0.1 mLs (10 Units total) into the skin  daily. Patient taking differently: Inject 15 Units into the skin daily before breakfast.  11/22/18  Yes Hosie Poisson, MD  insulin lispro (HUMALOG) 100 UNIT/ML injection Inject 1-2 Units into the skin See admin instructions. Inject 1-2 units into the skin three times a day before meals, per sliding scale: 1 unit for every 50 points above a BGL of 150   Yes [provider]  ipratropium-albuterol (DUONEB) 0.5-2.5 (3) MG/3ML SOLN Take 3 mLs by nebulization See admin instructions. Nebulize and inhale 3 ml's into the lungs every four hours, while awake   Yes [provider]  levalbuterol (XOPENEX HFA) 45 MCG/ACT inhaler Inhale 2 puffs into the lungs 4 (four) times daily as needed for wheezing or shortness of breath.   Yes [provider]  levothyroxine (SYNTHROID) 112 MCG tablet Take 1 tablet (112 mcg total) by mouth daily before breakfast. 1 AM Patient taking differently: Take 112 mcg by mouth daily before breakfast.  07/04/18  Yes Nche, Charlene Brooke, NP  Lubricants (K-Y LUBRICANT JELLY SENSITIVE EX) Place 1 application into both nostrils as needed (as directed- for lubrication).    Yes [provider]  midodrine (PROAMATINE) 10 MG tablet Take 10 mg by mouth See admin instructions. Take 10 mg by mouth on Tuesday, Thursday, Saturday 45 minutes before dialysis   Yes [provider]  montelukast (SINGULAIR) 10 MG tablet Take 10 mg by mouth at bedtime.    Yes [provider]  NASAL SPRAY SALINE NA Place 1-2 sprays into both nostrils as needed (for congestion- "[employer] Simply Saline" brand).   Yes [provider]  nystatin (MYCOSTATIN/NYSTOP) powder Apply 1 g topically 2 (two) times daily as needed (as directed- to any rashes).    Yes [provider]  nystatin cream (MYCOSTATIN) Apply 1 application topically 2 (two) times daily as needed for dry skin.    Yes [provider]  OXYGEN Inhale 5-6 L/min into the lungs continuous.    Yes [provider]  oxymetazoline (AFRIN) 0.05 % nasal spray Place 1 spray into both nostrils 2 (two) times daily as needed for congestion.   Yes [provider]  rosuvastatin (CRESTOR) 40 MG tablet Take 40 mg by mouth every morning.    Yes [provider]  senna (SENOKOT) 8.6 MG TABS tablet Take 1 tablet by mouth every morning.   Yes [provider]  sevelamer carbonate (RENVELA) 800 MG tablet Take 2 tablets (1,600 mg total) by mouth 3 (three) times daily with meals. Patient taking differently: Take 800-1,600 mg by mouth See admin instructions. Take 1,600 mg by mouth three times a day with meals and 800-1,600 mg with each snack 11/21/18  Yes Hosie Poisson, MD  albuterol (PROVENTIL) (2.5 MG/3ML) 0.083% nebulizer solution Take 3 mLs (2.5 mg total) by nebulization 4 (four) times daily. Patient not taking: Reported on 12/24/2018 09/16/18   Mariel Aloe,  MD  aztreonam 0.5 g in dextrose 5 % 50 mL Inject 0.5 g into the vein every 12 (twelve) hours. Patient not taking: Reported on 12/24/2018 11/22/18   Hosie Poisson, MD  benzonatate (TESSALON) 200 MG capsule Take 1 capsule (200 mg total) by mouth 3 (three) times daily as needed for cough. Patient not taking: Reported on 12/24/2018 11/21/18   Hosie Poisson, MD  calcitRIOL (ROCALTROL) 0.25 MCG capsule Take 7 capsules (1.75 mcg total) by mouth Every Tuesday,Thursday,and Saturday with dialysis. 11/22/18   Hosie Poisson, MD  cinacalcet (SENSIPAR) 30 MG tablet Take 2 tablets (60 mg total) by mouth Every Tuesday,Thursday,and Saturday with dialysis. 11/22/18   Hosie Poisson, MD  Darbepoetin Alfa (ARANESP) 25 MCG/0.42ML SOSY injection Inject 0.42 mLs (25 mcg total) into the vein every Tuesday with hemodialysis. 11/22/18   Hosie Poisson, MD  lidocaine (LIDODERM) 5 % Place 1 patch onto the skin every 12 (twelve) hours as needed (back pain). Remove & Discard patch within 12 hours or as directed by MD     [provider]    multivitamin (RENA-VIT) TABS tablet Take 1 tablet by mouth at bedtime. Patient not taking: Reported on 12/24/2018 11/21/18   Hosie Poisson, MD  Nutritional Supplements (FEEDING SUPPLEMENT, NEPRO CARB STEADY,) LIQD Take 237 mLs by mouth 2 (two) times daily between meals. Patient not taking: Reported on 12/24/2018 11/22/18   Hosie Poisson, MD  vancomycin (VANCOCIN) 1-5 GM/200ML-% SOLN Inject 200 mLs (1,000 mg total) into the vein Every Tuesday,Thursday,and Saturday with dialysis. Patient not taking: Reported on 12/24/2018 11/22/18   Hosie Poisson, MD    Family History Family History  Problem Relation Age of Onset   Heart disease Mother    Hypertension Mother    Heart disease Father    Hypertension Father     Social History Social History   Tobacco Use   Smoking status: Former Smoker    Packs/day: 1.00    Years: 35.00    Pack years: 35.00    Quit date: 03/29/1981    Years since quitting: 37.7   Smokeless tobacco: Never Used  Substance Use Topics   Alcohol use: Never    Frequency: Never   Drug use: Never     Allergies   Avelox [moxifloxacin hcl in nacl], Codeine, Penicillins, Sulfa antibiotics, Dextromethorphan-guaifenesin, and Shellfish allergy   Review of Systems Review of Systems  All other systems reviewed and are negative.    Physical Exam Updated Vital Signs BP 112/77    Pulse 99    Temp 98.8 F (37.1 C) (Axillary)    Resp (!) 24    Ht 5\' 1"  (1.549 m)    Wt 93 kg    SpO2 94%    BMI 38.74 kg/m   Physical Exam Vitals signs and nursing note reviewed.  Constitutional:      General: She is in acute distress.     Appearance: She is well-developed. She is ill-appearing.  HENT:     Head: Normocephalic and atraumatic.  Cardiovascular:     Rate and Rhythm: Normal rate and regular rhythm.     Heart sounds: No murmur.  Pulmonary:     Effort: Pulmonary effort is normal. No respiratory distress.     Breath sounds: Normal breath sounds.     Comments: Vas  cath in right anterior chest wall Abdominal:     Palpations: Abdomen is soft.     Tenderness: There is no guarding or rebound.     Comments: Mild generalized abdominal tenderness  Genitourinary:    Comments: Mild perirectal erythema without significant edema or tenderness. Musculoskeletal:        General: No tenderness.     Comments: Non-pitting edema to bilateral lower extremities.  Skin:    General: Skin is warm and dry.  Neurological:     Comments: Lethargic, arouses to verbal stimuli. Weakly moves all four extremities  Psychiatric:     Comments: Unable to assess      ED Treatments / Results  Labs (all labs ordered are listed, but only abnormal results are displayed) Labs Reviewed  CBC WITH DIFFERENTIAL/PLATELET - Abnormal; Notable for the following components:      Result Value   WBC 19.3 (*)    MCV 100.2 (*)    RDW 16.7 (*)    nRBC 0.3 (*)    Neutro Abs 15.6 (*)    Abs Immature Granulocytes 0.53 (*)    All other components within normal limits  COMPREHENSIVE METABOLIC PANEL - Abnormal; Notable for the following components:   Chloride 92 (*)    CO2 20 (*)    Glucose, Bld 205 (*)    Creatinine, Ser 3.70 (*)    Calcium 8.2 (*)    GFR calc non Af Amer 11 (*)    GFR calc Af Amer 13 (*)    Anion gap 23 (*)    All other components within normal limits  GLUCOSE, CAPILLARY - Abnormal; Notable for the following components:   Glucose-Capillary 155 (*)    All other components within normal limits  CBG MONITORING, ED - Abnormal; Notable for the following components:   Glucose-Capillary 136 (*)    All other components within normal limits  I-STAT CHEM 8, ED - Abnormal; Notable for the following components:   Sodium 132 (*)    Chloride 95 (*)    Creatinine, Ser 3.40 (*)    Glucose, Bld 198 (*)    Calcium, Ion 0.92 (*)    Hemoglobin 15.6 (*)    All other components within normal limits  POCT I-STAT EG7 - Abnormal; Notable for the following components:   pH, Ven 7.178  (*)    Acid-base deficit 8.0 (*)    Sodium 132 (*)    Calcium, Ion 0.91 (*)    All other components within normal limits  CBG MONITORING, ED - Abnormal; Notable for the following components:   Glucose-Capillary 132 (*)    All other components within normal limits  SARS CORONAVIRUS 2 (TAT 6-24 HRS)  SARS CORONAVIRUS 2 BY RT PCR (HOSPITAL ORDER, [place] [facility])  CULTURE, BLOOD (ROUTINE X 2)  CULTURE, BLOOD (ROUTINE X 2)  MRSA PCR SCREENING  VANCOMYCIN, RANDOM  LIPASE, BLOOD  LACTIC ACID, PLASMA  PROCALCITONIN  LACTIC ACID, PLASMA  URINALYSIS, COMPLETE (UACMP) WITH MICROSCOPIC  MAGNESIUM  PHOSPHORUS  CBC  COMPREHENSIVE METABOLIC PANEL  I-STAT VENOUS BLOOD GAS, ED  POC SARS CORONAVIRUS 2 AG -  ED  POC SARS CORONAVIRUS 2 AG -  ED    EKG EKG Interpretation  Date/Time:  Saturday December 24 2018 16:56:07 EST Ventricular Rate:  104 PR Interval:    QRS Duration: 197 QT Interval:  368 QTC Calculation: 484 R Axis:   7 Text Interpretation: Sinus tachycardia Consider right atrial enlargement Probable left ventricular hypertrophy Abnormal T, consider ischemia, lateral leads Artifact in lead(s) I II aVR Confirmed by Quintella Reichert [phone]) on 12/24/2018 4:58:30 PM   Radiology Ct Abdomen Pelvis Wo Contrast  Result Date: 12/24/2018 CLINICAL DATA:  Hypotension, altered mental status, possible sepsis EXAM: CT ABDOMEN AND PELVIS WITHOUT CONTRAST TECHNIQUE: Multidetector CT imaging of the abdomen and pelvis was performed following the standard protocol without IV contrast. COMPARISON:  CTA chest 11/10/2018 FINDINGS: Lower chest: Evaluation of the lower lungs is complicated by significant respiratory motion artifact there is some increasing confluent opacity in the right lung base in a region of some additional bronchiectatic changes and scarring as well as calcified pleural plaque. Additional bandlike atelectasis in the right middle lobe. Background of centrilobular and  paraseptal emphysema. There is cardiomegaly with coronary artery atherosclerosis, aortic valve calcifications and central pulmonary enlargement, similar to prior. Hepatobiliary: No focal liver lesion. Moderate intra and extrahepatic biliary ductal dilatation with numerous calcified gallstones layering dependently within the gallbladder as well as multiple calcifications seen throughout the intra and extrahepatic biliary tree. Several smaller calcifications are also noted at the region of the ampulla of Vater. No pericholecystic inflammation. Pancreas: Pancreatic atrophy. No peripancreatic inflammation or ductal dilatation. Spleen: Normal in size without focal abnormality. Adrenals/Urinary Tract: Bilateral renal atrophy. Few hypoattenuating subcentimeter cystic foci in the right kidney. A hyperattenuating 1 cm focus in the interpolar right kidney, indeterminate. No urolithiasis or hydronephrosis. There is irregularity of the bladder wall though the urinary bladder is largely decompressed at the time of exam and therefore poorly evaluated by CT imaging. Stomach/Bowel: Small hiatal hernia. Stomach and duodenal sweep takes a normal course no small bowel dilatation or wall thickening. A normal appendix is visualized. Pancolonic diverticulosis without inflammation to suggest diverticulitis. Some rectal wall thickening and some associated stranding and thickening in the left ischioanal fat. Vascular/Lymphatic: Atherosclerotic plaque within the normal caliber aorta. Additional calcifications throughout the branch vessels. No suspicious or enlarged lymph nodes in the included lymphatic chains. Reproductive: Uterus is surgically absent. Multiple septate cystic foci within the ovaries, indeterminate on this exam. Other: Significant abdominal wall laxity. No free air or free fluid in the abdomen or pelvis. No bowel containing hernias. Musculoskeletal: Multilevel degenerative changes are present in the imaged portions of the  spine. Atrophy of the paraspinal and abdominal wall musculature with laxity. Pelvic floor laxity noted as well. Angulation of the coccyx with corticated margins may reflect remote injury. Multilevel degenerative changes are present in the imaged portions of the spine. No acute osseous abnormality or suspicious osseous lesion. IMPRESSION: 1. Moderate intra and extrahepatic biliary ductal dilatation with numerous calcified gallstones layering dependently within the gallbladder as well as multiple calcifications throughout the intra and extrahepatic biliary tree. Several smaller calcifications are also noted at the region of the ampulla of Vater. Findings compatible with choledocholithiasis. Correlate with serologies. 2. A hyperattenuating 1 cm focus in the interpolar right kidney, indeterminate on this exam. Recommend further evaluation with nonemergent ultrasound. 3. Some rectal wall thickening and associated stranding and thickening in the left ischioanal fat may represent proctitis and or a developing perianal abscess. 4. Multiple septate cystic foci within the ovaries, indeterminate on this exam. Recommend further evaluation with nonemergent pelvic ultrasound. 5. Chronic scarring and architectural distortion in the right lung base with some pleural calcifications more pronounced than on comparison exam. Some increasing peribronchovascular opacity in that right lower lobe may reflect of infection or aspiration. 6. Cardiomegaly with coronary artery atherosclerosis, aortic valve calcifications and central pulmonary enlargement, similar to prior. 7. Aortic Atherosclerosis (ICD10-I70.0). These results were called by telephone at the time of interpretation on 12/24/2018 at 7:49 pm to provider [facility] , who verbally acknowledged these results. Electronically Signed  By: Lovena Le M.D.   On: 12/24/2018 19:49   Ct Head Wo Contrast  Result Date: 12/24/2018 CLINICAL DATA:  Altered level of consciousness EXAM:  CT HEAD WITHOUT CONTRAST TECHNIQUE: Contiguous axial images were obtained from the base of the skull through the vertex without intravenous contrast. COMPARISON:  09/08/2018 FINDINGS: Brain: There is atrophy and chronic small vessel disease changes. No acute intracranial abnormality. Specifically, no hemorrhage, hydrocephalus, mass lesion, acute infarction, or significant intracranial injury. Previously seen bilateral subdural hematomas have resolved. Vascular: No hyperdense vessel or unexpected calcification. Skull: No acute calvarial abnormality. Sinuses/Orbits: Mucosal thickening in the paranasal sinuses. Other: None IMPRESSION: Atrophy, chronic microvascular disease. No acute intracranial abnormality. Electronically Signed   By: Rolm Baptise M.D.   On: 12/24/2018 19:36   Dg Chest Port 1 View  Result Date: 12/24/2018 CLINICAL DATA:  Hypertension.  Acute mental status change. EXAM: PORTABLE CHEST 1 VIEW COMPARISON:  November 21, 2018 FINDINGS: The dialysis catheter on the right is stable. No pneumothorax. No overt edema. Mild atelectasis in the bases. Stable cardiomediastinal silhouette. IMPRESSION: No acute abnormalities. Electronically Signed   By: Dorise Bullion III M.D   On: 12/24/2018 17:18    Procedures Procedures (including critical care time) CRITICAL CARE Performed by: Quintella Reichert   Total critical care time: 45 minutes  Critical care time was exclusive of separately billable procedures and treating other patients.  Critical care was necessary to treat or prevent imminent or life-threatening deterioration.  Critical care was time spent personally by me on the following activities: development of treatment plan with patient and/or surrogate as well as nursing, discussions with consultants, evaluation of patient's response to treatment, examination of patient, obtaining history from patient or surrogate, ordering and performing treatments and interventions, ordering and review of  laboratory studies, ordering and review of radiographic studies, pulse oximetry and re-evaluation of patient's condition.  Medications Ordered in ED Medications  norepinephrine (LEVOPHED) 4mg  in 249mL premix infusion (0 mcg/min Intravenous Stopped 12/24/18 2120)  aztreonam (AZACTAM) 0.5 g in dextrose 5 % 50 mL IVPB (0 g Intravenous Stopped 12/25/18 0007)  0.9 %  sodium chloride infusion (has no administration in time range)  vancomycin (VANCOCIN) IVPB 1000 mg/200 mL premix (has no administration in time range)  heparin injection 5,000 Units (5,000 Units Subcutaneous Given 12/24/18 2332)  albuterol (PROVENTIL) (2.5 MG/3ML) 0.083% nebulizer solution 2.5 mg (2.5 mg Nebulization Not Given 12/24/18 2234)  clonazePAM (KLONOPIN) tablet 0.5-1 mg (has no administration in time range)  docusate sodium (COLACE) capsule 100 mg (has no administration in time range)  FLUoxetine (PROZAC) capsule 10 mg (10 mg Oral Not Given 12/24/18 2213)  insulin detemir (LEVEMIR) injection 15 Units (has no administration in time range)  levalbuterol (XOPENEX) nebulizer solution 0.63 mg (has no administration in time range)  levothyroxine (SYNTHROID) tablet 112 mcg (has no administration in time range)  sevelamer carbonate (RENVELA) tablet 1,600 mg (has no administration in time range)  montelukast (SINGULAIR) tablet 10 mg (10 mg Oral Not Given 12/24/18 2215)  insulin aspart (novoLOG) injection 0-6 Units (has no administration in time range)  insulin aspart (novoLOG) injection 0-5 Units (0 Units Subcutaneous Not Given 12/24/18 2251)  fentaNYL (SUBLIMAZE) injection 12.5 mcg (has no administration in time range)  ipratropium-albuterol (DUONEB) 0.5-2.5 (3) MG/3ML nebulizer solution 3 mL (3 mLs Nebulization Given 12/24/18 2234)  clonazePAM (KLONOPIN) tablet 1 mg (1 mg Oral Given 12/24/18 2326)  Chlorhexidine Gluconate Cloth 2 % PADS 6 each (has no administration in time range)  vancomycin (VANCOCIN) 2,000 mg in sodium chloride  0.9 % 500 mL IVPB (0 mg Intravenous Stopped 12/24/18 2138)     Initial Impression / Assessment and Plan / ED Course  I have reviewed the triage vital signs and the nursing notes.  Pertinent labs & imaging results that were available during my care of the patient were reviewed by me and considered in my medical decision making (see chart for details).        Patient here for evaluation of hypotension and altered mental status. She is ill appearing on ED arrival with lethargy, marked hypotension. She received 1 L of IV fluid prior to ED arrival. She was started on levophed for blood pressure support. Upon improvement in her blood pressure her mental status did improve. She was treated with broad-spectrum antibiotics for possible sepsis pending further evaluation. Discussed the case with patient's sons [name] and [name] critical nature of illness. They state that she is DNR/DNI but would want treatment for medical and reversible causes. Critical care consulted for admission for further management of sepsis, altered mental status.  Final Clinical Impressions(s) / ED Diagnoses   Final diagnoses:  Hypotension, unspecified hypotension type  Sepsis with encephalopathy, due to unspecified organism, unspecified whether septic shock present [facility])    ED Discharge Orders    None       Quintella Reichert, MD 12/25/18 [phone]

## 2018-12-24 NOTE — ED Notes (Signed)
Pt returned from CT with this Rn

## 2018-12-24 NOTE — ED Notes (Signed)
Respiratory at bedside to place arterial line

## 2018-12-24 NOTE — ED Notes (Signed)
Iv team at bedside  

## 2018-12-24 NOTE — ED Notes (Signed)
Family at bedside. 

## 2018-12-24 NOTE — ED Notes (Signed)
CBG 132. 

## 2018-12-24 NOTE — ED Notes (Signed)
Phlebotomy unable to collect second set of blood cultures

## 2018-12-24 NOTE — ED Triage Notes (Signed)
Pt from dialysis center with ems, pt lives at [place]. Pt suddenly went unresponsive during treatment, staff could not feel a radial pulse, 30 seconds of chest compressions done. Pt responding to verbal stimuli with ems, oriented x3. Hypotensive in the 60s. 1L fluid given at dialysis. Pt arrives to ED on NRB, responding to verbal

## 2018-12-24 NOTE — ED Notes (Signed)
ED Provider at bedside. 

## 2018-12-24 NOTE — H&P (Signed)
NAME:  Joan Mosley, MRN:  [id number], DOB:  08-02-1945, LOS: 0 ADMISSION DATE:  12/24/2018, CONSULTATION DATE: 12/24/2018 REFERRING MD:  Ralene Bathe, CHIEF COMPLAINT: Hypotension   Brief History   73 year old female status post hypotensive episode in dialysis  History of present illness   Patient is a 73 year old female brought to the emergency room after hypotensive episode during dialysis.  The patient is significantly debilitated and needs a assistance with affairs of daily living lives with her son.  She seemed to be in her usual state of health until dialysis today.  On my evaluation the patient is arousable will weakly answer questions complains of vague back pain.  She is awake and not somnolent.  Her son is at the bedside. She has been afebrile in the emergency room.  She has a night white count of 19.  Electrolytes are wholly unremarkable save an anion gap of 23 with a bicarb of 20.  Alkaline phosphatase AST ALT are all normal.  Lipase is slightly elevated at 40 and white count is 19,000.  CT scan of the brain showed consistent with chronic microvascular disease.  CT scan of the abdomen pelvis showed diverticulosis without evidence of diverticulitis with some biliary tract dilation and choledocholithiasis but no clear evidence of cholecystitis.  There was some left rectal wall thickening suggestive of possible proctitis there is a question of early right lower lobe infiltrate on CT scan of the chest. The patient is on 2 mics of Levophed currently with a MAP of 62 with a systolic of 0000000.  She is chronically hypotensive and on midrodrine.  Patient does have a DNR she is not to be intubated and the family does not want aggressive resuscitative measures.  They would like to try some vasopressor if needed through the acute event.  Past Medical History   . Arthritis   . COPD (chronic obstructive pulmonary disease) ([place])   . Diabetes mellitus without complication ([place])   . Diabetic neuropathy  ([place])   . 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 Renal disorder    ESRD  . Thyroid disease   . TIA (transient ischemic attack)      Significant Hospital Events   NA  Consults:  Nephrology in the morning  Procedures:  NA  Significant Diagnostic Tests:  CT scan of the chest blood cultures  Micro Data:  Pending  Antimicrobials:  We will start vancomycin and aztreonam  Interim history/subjective:  NA  Objective   Blood pressure 109/67, pulse 99, temperature (!) 97.3 F (36.3 C), temperature source Axillary, resp. rate (!) 25, SpO2 90 %.       No intake or output data in the 24 hours ending 12/24/18 2126 There were no vitals filed for this visit.  Examination: General: Elderly white female appears older than stated age poorly interactive HENT: Within normal limits Lungs: Clear Cardiovascular: Regular rate and rhythm no murmur gallop Abdomen: Bowel sounds hypoactive no rigidity Extremities: 1+ edema Neuro: Nonfocal GU: N/A  Resolved Hospital Problem list   NA  Assessment & Plan:  1.  That is post hypotensive episode during dialysis: Currently the patient does have elevated white count but her hemoglobin was up to 5 which is a little high for her suggest possible dehydration prior to dialysis with hypotensive episode due to intolerance of dialysis.  She may also have a right lower lobe pneumonia that is early or possible proctitis.  While hypotensive she is chronically hypotensive and on a very small  amount of Levophed.  We will try to wean off the Levophed cover with antibiotics and gently hydrate to see if her hypotension reverses.  Currently she is afebrile.  2.  Dialysis: We will ask nephrology to assist while in-house  3.  Multiorgan disease with severe chronic COPD, dialysis, history of diabetes mellitus, with second hospitalization in the last month previous being for pneumonia.  Will ask palliative care to see and assist  4.  For hypotension we  will also gently hydrate  5.  We will hold n.p.o. tonight with reevaluation in the morning.  She did well transfer to the floor with palliative care consult would be most appropriate.    Best practice:  Diet: NPO Pain/Anxiety/Delirium protocol (if indicated): PRN VAP protocol (if indicated): NA DVT prophylaxis: Heparin GI prophylaxis: na Glucose control:per protocol Mobility: bed rest Code Status: dnr save pressors as above Family Communication: With sons Disposition: to [facility] to wean levophed monitor for worsening condition  Labs   CBC: Recent Labs  Lab 12/24/18 1654 12/24/18 1701  WBC 19.3*  --   NEUTROABS 15.6*  --   HGB 13.5 15.0  15.6*  HCT 44.9 44.0  46.0  MCV 100.2*  --   PLT 161  --     Basic Metabolic Panel: Recent Labs  Lab 12/24/18 1654 12/24/18 1701  NA 135 132*  132*  K 4.2 4.0  4.0  CL 92* 95*  CO2 20*  --   GLUCOSE 205* 198*  BUN 11 13  CREATININE 3.70* 3.40*  CALCIUM 8.2*  --    GFR: CrCl cannot be calculated (Unknown ideal weight.). Recent Labs  Lab 12/24/18 1654  WBC 19.3*    Liver Function Tests: Recent Labs  Lab 12/24/18 1654  AST 28  ALT 19  ALKPHOS 111  BILITOT 1.0  PROT 7.2  ALBUMIN 3.5   Recent Labs  Lab 12/24/18 1654  LIPASE 40   No results for input(s): AMMONIA in the last 168 hours.  ABG    Component Value Date/Time   PHART 7.369 11/16/2018 1225   PCO2ART 47.7 11/16/2018 1225   PO2ART 66.2 (L) 11/16/2018 1225   HCO3 21.0 12/24/2018 1701   TCO2 22 12/24/2018 1701   TCO2 23 12/24/2018 1701   ACIDBASEDEF 8.0 (H) 12/24/2018 1701   O2SAT 55.0 12/24/2018 1701     Coagulation Profile: No results for input(s): INR, PROTIME in the last 168 hours.  Cardiac Enzymes: No results for input(s): CKTOTAL, CKMB, CKMBINDEX, TROPONINI in the last 168 hours.  HbA1C: Hgb A1c MFr Bld  Date/Time Value Ref Range Status  11/18/2018 11:20 AM 5.6 4.8 - 5.6 % Final    Comment:    (NOTE) Pre diabetes:          5.7%-6.4%  Diabetes:              >6.4% Glycemic control for   <7.0% adults with diabetes   07/01/2018 11:49 AM 6.1 (H) <5.7 % of total Hgb Final    Comment:    For someone without known diabetes, a hemoglobin  A1c value between 5.7% and 6.4% is consistent with prediabetes and should be confirmed with a  follow-up test. . For someone with known diabetes, a value <7% indicates that their diabetes is well controlled. A1c targets should be individualized based on duration of diabetes, age, comorbid conditions, and other considerations. . This assay result is consistent with an increased risk of diabetes. . Currently, no consensus exists regarding use of hemoglobin A1c  for diagnosis of diabetes for children. .     CBG: Recent Labs  Lab 12/24/18 1648  GLUCAP 136*    Review of Systems:   Currently unable to obtain  Past Medical History  She,  has a past medical history of Arthritis, COPD (chronic obstructive pulmonary disease) ([place]), Diabetes mellitus without complication ([place]), Diabetic neuropathy ([place]), Oxygen dependent (03/30/2018), Renal disorder, Thyroid disease, and TIA (transient ischemic attack).   Surgical History    Past Surgical History:  Procedure Laterality Date  . ABDOMINAL HYSTERECTOMY    . IR FLUORO GUIDE CV LINE RIGHT  11/14/2018  . RECTOCELE REPAIR    . REPLACEMENT TOTAL KNEE BILATERAL Bilateral 2008  . THYROIDECTOMY     80%  . VAGINAL WOUND CLOSURE / REPAIR       Social History   reports that she quit smoking about 37 years ago. She has a 35.00 pack-year smoking history. She has never used smokeless tobacco. She reports that she does not drink alcohol or use drugs.   Family History   Her family history includes Heart disease in her father and mother; Hypertension in her father and mother.   Allergies Allergies  Allergen Reactions  . Avelox [Moxifloxacin Hcl In Nacl] Other (See Comments)    Unknown reaction  . Codeine Anaphylaxis  . Penicillins Rash     Did it involve swelling of the face/tongue/throat, SOB, or low BP? Unk Did it involve sudden or severe rash/hives, skin peeling, or any reaction on the inside of your mouth or nose? Yes Did you need to seek medical attention at a hospital or doctor's office? Unk When did it last happen? Unk If all above answers are "NO", may proceed with cephalosporin use.   . Sulfa Antibiotics Other (See Comments)    Unknown reaction  . Dextromethorphan-Guaifenesin Other (See Comments)    Reported by [facility] - unknown reaction  . Shellfish Allergy Hives, Itching and Rash     Home Medications  Prior to Admission medications   Medication Sig Start Date End Date Taking? Authorizing Provider  acetaminophen (TYLENOL) 500 MG tablet Take 1,000 mg by mouth every 6 (six) hours as needed (for arthritic pain).    Yes [provider]  clonazePAM (KLONOPIN) 1 MG tablet Take 0.5-1 mg by mouth See admin instructions. Take 0.5-1 mg by mouth in the morning and 1 mg at bedtime   Yes [provider]  docusate sodium (COLACE) 100 MG capsule Take 100 mg by mouth every morning.   Yes [provider]  FLUoxetine (PROZAC) 10 MG tablet Take 1 tablet (10 mg total) by mouth daily. 08/08/18  Yes Nche, Charlene Brooke, NP  furosemide (LASIX) 40 MG tablet Take 80 mg by mouth See admin instructions. Take 80 mg by mouth in the morning on Sun/Mon/Wed/Fri (non-dialysis days) 10/13/18  Yes [provider]  insulin detemir (LEVEMIR) 100 UNIT/ML injection Inject 0.1 mLs (10 Units total) into the skin daily. Patient taking differently: Inject 15 Units into the skin daily before breakfast.  11/22/18  Yes Hosie Poisson, MD  insulin lispro (HUMALOG) 100 UNIT/ML injection Inject 1-2 Units into the skin See admin instructions. Inject 1-2 units into the skin three times a day before meals, per sliding scale: 1 unit for every 50 points above a BGL of 150   Yes [provider]  ipratropium-albuterol (DUONEB)  0.5-2.5 (3) MG/3ML SOLN Take 3 mLs by nebulization See admin instructions. Nebulize and inhale 3 ml's into the lungs every four hours, while  awake   Yes [provider]  levalbuterol (XOPENEX HFA) 45 MCG/ACT inhaler Inhale 2 puffs into the lungs 4 (four) times daily as needed for wheezing or shortness of breath.   Yes [provider]  levothyroxine (SYNTHROID) 112 MCG tablet Take 1 tablet (112 mcg total) by mouth daily before breakfast. 1 AM Patient taking differently: Take 112 mcg by mouth daily before breakfast.  07/04/18  Yes Nche, Charlene Brooke, NP  Lubricants (K-Y LUBRICANT JELLY SENSITIVE EX) Place 1 application into both nostrils as needed (as directed- for lubrication).    Yes [provider]  midodrine (PROAMATINE) 10 MG tablet Take 10 mg by mouth See admin instructions. Take 10 mg by mouth on Tuesday, Thursday, Saturday 45 minutes before dialysis   Yes [provider]  montelukast (SINGULAIR) 10 MG tablet Take 10 mg by mouth at bedtime.    Yes [provider]  NASAL SPRAY SALINE NA Place 1-2 sprays into both nostrils as needed (for congestion- "[employer] Simply Saline" brand).   Yes [provider]  nystatin (MYCOSTATIN/NYSTOP) powder Apply 1 g topically 2 (two) times daily as needed (as directed- to any rashes).    Yes [provider]  nystatin cream (MYCOSTATIN) Apply 1 application topically 2 (two) times daily as needed for dry skin.    Yes [provider]  OXYGEN Inhale 5-6 L/min into the lungs continuous.   Yes [provider]  oxymetazoline (AFRIN) 0.05 % nasal spray Place 1 spray into both nostrils 2 (two) times daily as needed for congestion.   Yes [provider]  rosuvastatin (CRESTOR) 40 MG tablet Take 40 mg by mouth every morning.    Yes [provider]  senna (SENOKOT) 8.6 MG TABS tablet Take 1 tablet by mouth every morning.   Yes [provider]  sevelamer carbonate  (RENVELA) 800 MG tablet Take 2 tablets (1,600 mg total) by mouth 3 (three) times daily with meals. Patient taking differently: Take 800-1,600 mg by mouth See admin instructions. Take 1,600 mg by mouth three times a day with meals and 800-1,600 mg with each snack 11/21/18  Yes Hosie Poisson, MD  albuterol (PROVENTIL) (2.5 MG/3ML) 0.083% nebulizer solution Take 3 mLs (2.5 mg total) by nebulization 4 (four) times daily. Patient not taking: Reported on 12/24/2018 09/16/18   Mariel Aloe, MD  aztreonam 0.5 g in dextrose 5 % 50 mL Inject 0.5 g into the vein every 12 (twelve) hours. Patient not taking: Reported on 12/24/2018 11/22/18   Hosie Poisson, MD  benzonatate (TESSALON) 200 MG capsule Take 1 capsule (200 mg total) by mouth 3 (three) times daily as needed for cough. Patient not taking: Reported on 12/24/2018 11/21/18   Hosie Poisson, MD  calcitRIOL (ROCALTROL) 0.25 MCG capsule Take 7 capsules (1.75 mcg total) by mouth Every Tuesday,Thursday,and Saturday with dialysis. 11/22/18   Hosie Poisson, MD  cinacalcet (SENSIPAR) 30 MG tablet Take 2 tablets (60 mg total) by mouth Every Tuesday,Thursday,and Saturday with dialysis. 11/22/18   Hosie Poisson, MD  Darbepoetin Alfa (ARANESP) 25 MCG/0.42ML SOSY injection Inject 0.42 mLs (25 mcg total) into the vein every Tuesday with hemodialysis. 11/22/18   Hosie Poisson, MD  lidocaine (LIDODERM) 5 % Place 1 patch onto the skin every 12 (twelve) hours as needed (back pain). Remove & Discard patch within 12 hours or as directed by MD     [provider]  multivitamin (RENA-VIT) TABS tablet Take 1 tablet by mouth at bedtime. Patient not taking: Reported  on 12/24/2018 11/21/18   Hosie Poisson, MD  Nutritional Supplements (FEEDING SUPPLEMENT, NEPRO CARB STEADY,) LIQD Take 237 mLs by mouth 2 (two) times daily between meals. Patient not taking: Reported on 12/24/2018 11/22/18   Hosie Poisson, MD  vancomycin (VANCOCIN) 1-5 GM/200ML-% SOLN Inject 200 mLs (1,000 mg  total) into the vein Every Tuesday,Thursday,and Saturday with dialysis. Patient not taking: Reported on 12/24/2018 11/22/18   Hosie Poisson, MD     Critical care time: Over 35 minutes was spent in bedside evaluation in the case with APP and ER care staff chart review and critical care planning

## 2018-12-24 NOTE — Progress Notes (Signed)
Pharmacy Antibiotic Note  Joan Mosley is a 73 y.o. female admitted on 12/24/2018 with pneumonia.  Pharmacy has been consulted for Aztreonam and Vancomycin dosing.     Temp (24hrs), Avg:97.3 F (36.3 C), Min:97.3 F (36.3 C), Max:97.3 F (36.3 C)  Recent Labs  Lab 12/24/18 1654 12/24/18 1701 12/24/18 1745  WBC 19.3*  --   --   CREATININE 3.70* 3.40*  --   VANCORANDOM  --   --  <4    CrCl cannot be calculated (Unknown ideal weight.).    Allergies  Allergen Reactions  . Avelox [Moxifloxacin Hcl In Nacl] Other (See Comments)    Unknown reaction  . Codeine Anaphylaxis  . Penicillins Rash    Did it involve swelling of the face/tongue/throat, SOB, or low BP? Unknown Did it involve sudden or severe rash/hives, skin peeling, or any reaction on the inside of your mouth or nose? Yes Did you need to seek medical attention at a hospital or doctor's office? Unknown When did it last happen?unknown If all above answers are "NO", may proceed with cephalosporin use.   . Sulfa Antibiotics Other (See Comments)    Unknown reaction  . Dextromethorphan-Guaifenesin Other (See Comments)    Reported by [facility] - unknown reaction  . Shellfish Allergy Hives, Itching and Rash    Antimicrobials this admission: 11/28 Aztreonam >>  11/28 Vancomycin >>   Dose adjustments this admission: N/a  Microbiology results: 11/28 BCx: Pending   Plan: - Vancomycin 2g IV x 1 dose  - Followed by Vancomycin 1g/HD-TTS on 12/01 - Start Azactam 500 mg IV every 12 hours - Will continue to follow HD schedule/duration, culture results, LOT, and antibiotic de-escalation plans   Thank you for allowing pharmacy to be a part of this patient's care.  Duanne Limerick PharmD. BCPS  12/24/2018 6:51 PM

## 2018-12-24 NOTE — ED Notes (Signed)
Main lab to add on lipase

## 2018-12-24 NOTE — ED Notes (Signed)
Per previous RN, second set of cultures could not be obtained. Previous RN also states unsure if pt can make urine.

## 2018-12-24 NOTE — ED Notes (Signed)
Main pharmacy called regarding aztreonam, pharmacy to send down medication

## 2018-12-24 NOTE — ED Notes (Signed)
ED TO INPATIENT HANDOFF REPORT  ED Nurse Name and Phone #: Domenic Polite Name/Age/Gender Joan Mosley 73 y.o. female Room/Bed: RESUSC/RESUSC  Code Status   Code Status: DNR  Home/SNF/Other Nursing Home Patient oriented to: self, place, time and situation Is this baseline? Yes   Triage Complete: Triage complete  Chief Complaint Altered  Triage Note Pt from dialysis center with ems, pt lives at [facility]. Pt suddenly went unresponsive during treatment, staff could not feel a radial pulse, 30 seconds of chest compressions done. Pt responding to verbal stimuli with ems, oriented x3. Hypotensive in the 60s. 1L fluid given at dialysis. Pt arrives to ED on NRB, responding to verbal   Allergies Allergies  Allergen Reactions  . Avelox [Moxifloxacin Hcl In Nacl] Other (See Comments)    Unknown reaction  . Codeine Anaphylaxis  . Penicillins Rash    Did it involve swelling of the face/tongue/throat, SOB, or low BP? Unk Did it involve sudden or severe rash/hives, skin peeling, or any reaction on the inside of your mouth or nose? Yes Did you need to seek medical attention at a hospital or doctor's office? Unk When did it last happen? Unk If all above answers are "NO", may proceed with cephalosporin use.   . Sulfa Antibiotics Other (See Comments)    Unknown reaction  . Dextromethorphan-Guaifenesin Other (See Comments)    Reported by [facility] - unknown reaction  . Shellfish Allergy Hives, Itching and Rash    Level of Care/Admitting Diagnosis ED Disposition    ED Disposition Condition [facility] Area: [place] [[id number]]  Level of Care: ICU [6]  Covid Evaluation: Confirmed COVID Negative  Date Laboratory Confirmed COVID Negative: 12/24/2018  Diagnosis: Hypotension [846659]  Admitting Physician: Shellia Cleverly [[id number]]  Attending Physician: Shellia Cleverly [[id number]]  Estimated length of stay: 3 - 4 days  Certification:: I certify this  patient will need inpatient services for at least 2 midnights  PT Class (Do Not Modify): Inpatient [101]  PT Acc Code (Do Not Modify): Private [1]       B Medical/Surgery History Past Medical History:  Diagnosis Date  . Arthritis   . COPD (chronic obstructive pulmonary disease) ([place])   . Diabetes mellitus without complication ([place])   . Diabetic neuropathy ([facility])   . 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 Renal disorder    ESRD  . Thyroid disease   . TIA (transient ischemic attack)    Past Surgical History:  Procedure Laterality Date  . ABDOMINAL HYSTERECTOMY    . IR FLUORO GUIDE CV LINE RIGHT  11/14/2018  . RECTOCELE REPAIR    . REPLACEMENT TOTAL KNEE BILATERAL Bilateral 2008  . THYROIDECTOMY     80%  . VAGINAL WOUND CLOSURE / REPAIR       A IV Location/Drains/Wounds Patient Lines/Drains/Airways Status   Active Line/Drains/Airways    Name:   Placement date:   Placement time:   Site:   Days:   Arterial Line 12/24/18 Right Radial   12/24/18    1815    Radial   less than 1   Peripheral IV 12/24/18 Right Hand   12/24/18    1656    Hand   less than 1   Peripheral IV 12/24/18 Left;Anterior Forearm   12/24/18    1749    Forearm   less than 1   Hemodialysis Catheter Right Internal jugular Double lumen Permanent (Tunneled)   11/14/18  1142    Internal jugular   40          Intake/Output Last 24 hours No intake or output data in the 24 hours ending 12/24/18 2350  Labs/Imaging Results for orders placed or performed during the hospital encounter of 12/24/18 (from the past 48 hour(s))  CBG monitoring, ED     Status: Abnormal   Collection Time: 12/24/18  4:48 PM  Result Value Ref Range   Glucose-Capillary 136 (H) 70 - 99 mg/dL  CBC with Differential     Status: Abnormal   Collection Time: 12/24/18  4:54 PM  Result Value Ref Range   WBC 19.3 (H) 4.0 - 10.5 K/uL   RBC 4.48 3.87 - 5.11 MIL/uL   Hemoglobin 13.5 12.0 - 15.0 g/dL   HCT 44.9 36.0 - 46.0 %   MCV 100.2  (H) 80.0 - 100.0 fL   MCH 30.1 26.0 - 34.0 pg   MCHC 30.1 30.0 - 36.0 g/dL   RDW 16.7 (H) 11.5 - 15.5 %   Platelets 161 150 - 400 K/uL   nRBC 0.3 (H) 0.0 - 0.2 %   Neutrophils Relative % 81 %   Neutro Abs 15.6 (H) 1.7 - 7.7 K/uL   Lymphocytes Relative 12 %   Lymphs Abs 2.4 0.7 - 4.0 K/uL   Monocytes Relative 4 %   Monocytes Absolute 0.7 0.1 - 1.0 K/uL   Eosinophils Relative 0 %   Eosinophils Absolute 0.0 0.0 - 0.5 K/uL   Basophils Relative 0 %   Basophils Absolute 0.1 0.0 - 0.1 K/uL   Immature Granulocytes 3 %   Abs Immature Granulocytes 0.53 (H) 0.00 - 0.07 K/uL    Comment: Performed at [facility], [address] [place], [place] [address]  Comprehensive metabolic panel     Status: Abnormal   Collection Time: 12/24/18  4:54 PM  Result Value Ref Range   Sodium 135 135 - 145 mmol/L   Potassium 4.2 3.5 - 5.1 mmol/L   Chloride 92 (L) 98 - 111 mmol/L   CO2 20 (L) 22 - 32 mmol/L   Glucose, Bld 205 (H) 70 - 99 mg/dL   BUN 11 8 - 23 mg/dL   Creatinine, Ser 3.70 (H) 0.44 - 1.00 mg/dL   Calcium 8.2 (L) 8.9 - 10.3 mg/dL   Total Protein 7.2 6.5 - 8.1 g/dL   Albumin 3.5 3.5 - 5.0 g/dL   AST 28 15 - 41 U/L   ALT 19 0 - 44 U/L   Alkaline Phosphatase 111 38 - 126 U/L   Total Bilirubin 1.0 0.3 - 1.2 mg/dL   GFR calc non Af Amer 11 (L) >60 mL/min   GFR calc Af Amer 13 (L) >60 mL/min   Anion gap 23 (H) 5 - 15    Comment: Performed at [facility], [place] [address], [place], [place] [address]  Lipase, blood     Status: None   Collection Time: 12/24/18  4:54 PM  Result Value Ref Range   Lipase 40 11 - 51 U/L    Comment: Performed at [facility], [place] [address] [place], [place] [address]  I-stat chem 8, ED (not at [facility] or [facility])     Status: Abnormal   Collection Time: 12/24/18  5:01 PM  Result Value Ref Range   Sodium 132 (L) 135 - 145 mmol/L   Potassium 4.0 3.5 - 5.1 mmol/L   Chloride 95 (L) 98 - 111 mmol/L  BUN 13 8 - 23 mg/dL   Creatinine, Ser 3.40 (H) 0.44 -  1.00 mg/dL   Glucose, Bld 198 (H) 70 - 99 mg/dL   Calcium, Ion 0.92 (L) 1.15 - 1.40 mmol/L   TCO2 22 22 - 32 mmol/L   Hemoglobin 15.6 (H) 12.0 - 15.0 g/dL   HCT 46.0 36.0 - 46.0 %  POCT I-Stat EG7     Status: Abnormal   Collection Time: 12/24/18  5:01 PM  Result Value Ref Range   pH, Ven 7.178 (LL) 7.250 - 7.430   pCO2, Ven 56.7 44.0 - 60.0 mmHg   pO2, Ven 37.0 32.0 - 45.0 mmHg   Bicarbonate 21.0 20.0 - 28.0 mmol/L   TCO2 23 22 - 32 mmol/L   O2 Saturation 55.0 %   Acid-base deficit 8.0 (H) 0.0 - 2.0 mmol/L   Sodium 132 (L) 135 - 145 mmol/L   Potassium 4.0 3.5 - 5.1 mmol/L   Calcium, Ion 0.91 (L) 1.15 - 1.40 mmol/L   HCT 44.0 36.0 - 46.0 %   Hemoglobin 15.0 12.0 - 15.0 g/dL   Patient temperature HIDE    Sample type VENOUS    Comment NOTIFIED PHYSICIAN   POC SARS Coronavirus 2 Ag-ED - Nasal Swab (BD Veritor Kit)     Status: None   Collection Time: 12/24/18  5:22 PM  Result Value Ref Range   SARS Coronavirus 2 Ag NEGATIVE NEGATIVE    Comment: (NOTE) SARS-CoV-2 antigen NOT DETECTED.  Negative results are presumptive.  Negative results do not preclude SARS-CoV-2 infection and should not be used as the sole basis for treatment or other patient management decisions, including infection  control decisions, particularly in the presence of clinical signs and  symptoms consistent with COVID-19, or in those who have been in contact with the virus.  Negative results must be combined with clinical observations, patient history, and epidemiological information. The expected result is Negative. Fact Sheet for Patients: PodPark.tn Fact Sheet for Healthcare Providers: GiftContent.is This test is not yet approved or cleared by the [place] FDA and  has been authorized for detection and/or diagnosis of SARS-CoV-2 by FDA under an Emergency Use Authorization (EUA).  This EUA will remain in effect (meaning this test can be used) for  the duration of  the COVID-19 de claration under Section 564(b)(1) of the Act, 21 U.S.C. section 360bbb-3(b)(1), unless the authorization is terminated or revoked sooner.   Vancomycin, random     Status: None   Collection Time: 12/24/18  5:45 PM  Result Value Ref Range   Vancomycin Rm <4     Comment:        Random Vancomycin therapeutic range is dependent on dosage and time of specimen collection. A peak range is 20.0-40.0 ug/mL A trough range is 5.0-15.0 ug/mL        Performed at [facility] [address] [place], [place] [address]   SARS CORONAVIRUS 2 (TAT 6-24 HRS) Nasopharyngeal Nasopharyngeal Swab     Status: None   Collection Time: 12/24/18  6:20 PM   Specimen: Nasopharyngeal Swab  Result Value Ref Range   SARS Coronavirus 2 NEGATIVE NEGATIVE    Comment: (NOTE) SARS-CoV-2 target nucleic acids are NOT DETECTED. The SARS-CoV-2 RNA is generally detectable in upper and lower respiratory specimens during the acute phase of infection. Negative results do not preclude SARS-CoV-2 infection, do not rule out co-infections with other pathogens, and should not be used as the sole basis for treatment or other patient  management decisions. Negative results must be combined with clinical observations, patient history, and epidemiological information. The expected result is Negative. Fact Sheet for Patients: SugarRoll.be Fact Sheet for Healthcare Providers: https://www.woods-mathews.com/ This test is not yet approved or cleared by the [place] FDA and  has been authorized for detection and/or diagnosis of SARS-CoV-2 by FDA under an Emergency Use Authorization (EUA). This EUA will remain  in effect (meaning this test can be used) for the duration of the COVID-19 declaration under Section 56 4(b)(1) of the Act, 21 U.S.C. section 360bbb-3(b)(1), unless the authorization is terminated or revoked sooner. Performed at [facility], [place] [address], [place], [place] [address]   Lactic acid, plasma     Status: None   Collection Time: 12/24/18 10:22 PM  Result Value Ref Range   Lactic Acid, Venous 1.2 0.5 - 1.9 mmol/L    Comment: Performed at [facility] [address] [place], [place] [address]  Procalcitonin     Status: None   Collection Time: 12/24/18 10:22 PM  Result Value Ref Range   Procalcitonin 3.28 ng/mL    Comment:        Interpretation: PCT > 2 ng/mL: Systemic infection (sepsis) is likely, unless other causes are known. (NOTE)       Sepsis PCT Algorithm           Lower Respiratory Tract                                      Infection PCT Algorithm    ----------------------------     ----------------------------         PCT < 0.25 ng/mL                PCT < 0.10 ng/mL         Strongly encourage             Strongly discourage   discontinuation of antibiotics    initiation of antibiotics    ----------------------------     -----------------------------       PCT 0.25 - 0.50 ng/mL            PCT 0.10 - 0.25 ng/mL               OR       >80% decrease in PCT            Discourage initiation of                                            antibiotics      Encourage discontinuation           of antibiotics    ----------------------------     -----------------------------         PCT >= 0.50 ng/mL              PCT 0.26 - 0.50 ng/mL               AND       <80% decrease in PCT              Encourage initiation of  antibiotics       Encourage continuation           of antibiotics    ----------------------------     -----------------------------        PCT >= 0.50 ng/mL                  PCT > 0.50 ng/mL               AND         increase in PCT                  Strongly encourage                                      initiation of antibiotics    Strongly encourage escalation           of antibiotics                                      -----------------------------                                           PCT <= 0.25 ng/mL                                                 OR                                        > 80% decrease in PCT                                     Discontinue / Do not initiate                                             antibiotics Performed at [facility], [address] [place], [place] [address]   SARS Coronavirus 2 by RT PCR (hospital order, performed in [facility] hospital lab) Nasopharyngeal Nasopharyngeal Swab     Status: None   Collection Time: 12/24/18 10:22 PM   Specimen: Nasopharyngeal Swab  Result Value Ref Range   SARS Coronavirus 2 NEGATIVE NEGATIVE    Comment: (NOTE) SARS-CoV-2 target nucleic acids are NOT DETECTED. The SARS-CoV-2 RNA is generally detectable in upper and lower respiratory specimens during the acute phase of infection. The lowest concentration of SARS-CoV-2 viral copies this assay can detect is 250 copies / mL. A negative result does not preclude SARS-CoV-2 infection and should not be used as the sole basis for treatment or other patient management decisions.  A negative result may occur with improper specimen collection / handling, submission of specimen other than nasopharyngeal swab, presence of viral mutation(s) within the areas targeted by this assay, and inadequate number of viral copies (<250 copies / mL).  A negative result must be combined with clinical observations, patient history, and epidemiological information. Fact Sheet for Patients:   StrictlyIdeas.no Fact Sheet for Healthcare Providers: BankingDealers.co.za This test is not yet approved or cleared  by the [place] FDA and has been authorized for detection and/or diagnosis of SARS-CoV-2 by FDA under an Emergency Use Authorization (EUA).  This EUA will remain in effect (meaning this test can be used) for the duration of the COVID-19  declaration under Section 564(b)(1) of the Act, 21 U.S.C. section 360bbb-3(b)(1), unless the authorization is terminated or revoked sooner. Performed at [facility], [place] [address], [place], [place] [address]   CBG monitoring, ED     Status: Abnormal   Collection Time: 12/24/18 10:26 PM  Result Value Ref Range   Glucose-Capillary 132 (H) 70 - 99 mg/dL   Ct Abdomen Pelvis Wo Contrast  Result Date: 12/24/2018 CLINICAL DATA:  Hypotension, altered mental status, possible sepsis EXAM: CT ABDOMEN AND PELVIS WITHOUT CONTRAST TECHNIQUE: Multidetector CT imaging of the abdomen and pelvis was performed following the standard protocol without IV contrast. COMPARISON:  CTA chest 11/10/2018 FINDINGS: Lower chest: Evaluation of the lower lungs is complicated by significant respiratory motion artifact there is some increasing confluent opacity in the right lung base in a region of some additional bronchiectatic changes and scarring as well as calcified pleural plaque. Additional bandlike atelectasis in the right middle lobe. Background of centrilobular and paraseptal emphysema. There is cardiomegaly with coronary artery atherosclerosis, aortic valve calcifications and central pulmonary enlargement, similar to prior. Hepatobiliary: No focal liver lesion. Moderate intra and extrahepatic biliary ductal dilatation with numerous calcified gallstones layering dependently within the gallbladder as well as multiple calcifications seen throughout the intra and extrahepatic biliary tree. Several smaller calcifications are also noted at the region of the ampulla of Vater. No pericholecystic inflammation. Pancreas: Pancreatic atrophy. No peripancreatic inflammation or ductal dilatation. Spleen: Normal in size without focal abnormality. Adrenals/Urinary Tract: Bilateral renal atrophy. Few hypoattenuating subcentimeter cystic foci in the right kidney. A hyperattenuating 1 cm focus in the interpolar right kidney,  indeterminate. No urolithiasis or hydronephrosis. There is irregularity of the bladder wall though the urinary bladder is largely decompressed at the time of exam and therefore poorly evaluated by CT imaging. Stomach/Bowel: Small hiatal hernia. Stomach and duodenal sweep takes a normal course no small bowel dilatation or wall thickening. A normal appendix is visualized. Pancolonic diverticulosis without inflammation to suggest diverticulitis. Some rectal wall thickening and some associated stranding and thickening in the left ischioanal fat. Vascular/Lymphatic: Atherosclerotic plaque within the normal caliber aorta. Additional calcifications throughout the branch vessels. No suspicious or enlarged lymph nodes in the included lymphatic chains. Reproductive: Uterus is surgically absent. Multiple septate cystic foci within the ovaries, indeterminate on this exam. Other: Significant abdominal wall laxity. No free air or free fluid in the abdomen or pelvis. No bowel containing hernias. Musculoskeletal: Multilevel degenerative changes are present in the imaged portions of the spine. Atrophy of the paraspinal and abdominal wall musculature with laxity. Pelvic floor laxity noted as well. Angulation of the coccyx with corticated margins may reflect remote injury. Multilevel degenerative changes are present in the imaged portions of the spine. No acute osseous abnormality or suspicious osseous lesion. IMPRESSION: 1. Moderate intra and extrahepatic biliary ductal dilatation with numerous calcified gallstones layering dependently within the gallbladder as well as multiple calcifications throughout the intra and extrahepatic biliary tree. Several smaller calcifications are also noted at the region of the ampulla of Vater.  Findings compatible with choledocholithiasis. Correlate with serologies. 2. A hyperattenuating 1 cm focus in the interpolar right kidney, indeterminate on this exam. Recommend further evaluation with  nonemergent ultrasound. 3. Some rectal wall thickening and associated stranding and thickening in the left ischioanal fat may represent proctitis and or a developing perianal abscess. 4. Multiple septate cystic foci within the ovaries, indeterminate on this exam. Recommend further evaluation with nonemergent pelvic ultrasound. 5. Chronic scarring and architectural distortion in the right lung base with some pleural calcifications more pronounced than on comparison exam. Some increasing peribronchovascular opacity in that right lower lobe may reflect of infection or aspiration. 6. Cardiomegaly with coronary artery atherosclerosis, aortic valve calcifications and central pulmonary enlargement, similar to prior. 7. Aortic Atherosclerosis (ICD10-I70.0). These results were called by telephone at the time of interpretation on 12/24/2018 at 7:49 pm to provider [facility] , who verbally acknowledged these results. Electronically Signed   By: Lovena Le M.D.   On: 12/24/2018 19:49   Ct Head Wo Contrast  Result Date: 12/24/2018 CLINICAL DATA:  Altered level of consciousness EXAM: CT HEAD WITHOUT CONTRAST TECHNIQUE: Contiguous axial images were obtained from the base of the skull through the vertex without intravenous contrast. COMPARISON:  09/08/2018 FINDINGS: Brain: There is atrophy and chronic small vessel disease changes. No acute intracranial abnormality. Specifically, no hemorrhage, hydrocephalus, mass lesion, acute infarction, or significant intracranial injury. Previously seen bilateral subdural hematomas have resolved. Vascular: No hyperdense vessel or unexpected calcification. Skull: No acute calvarial abnormality. Sinuses/Orbits: Mucosal thickening in the paranasal sinuses. Other: None IMPRESSION: Atrophy, chronic microvascular disease. No acute intracranial abnormality. Electronically Signed   By: Rolm Baptise M.D.   On: 12/24/2018 19:36   Dg Chest Port 1 View  Result Date: 12/24/2018 CLINICAL DATA:   Hypertension.  Acute mental status change. EXAM: PORTABLE CHEST 1 VIEW COMPARISON:  November 21, 2018 FINDINGS: The dialysis catheter on the right is stable. No pneumothorax. No overt edema. Mild atelectasis in the bases. Stable cardiomediastinal silhouette. IMPRESSION: No acute abnormalities. Electronically Signed   By: Dorise Bullion III M.D   On: 12/24/2018 17:18    Pending Labs Unresulted Labs (From admission, onward)    Start     Ordered   12/25/18 0500  Magnesium  Tomorrow morning,   R     12/24/18 2138   12/25/18 0500  Phosphorus  Tomorrow morning,   R     12/24/18 2138   12/25/18 0500  CBC  Tomorrow morning,   R     12/24/18 2138   12/25/18 0500  Comprehensive metabolic panel  Tomorrow morning,   R     12/24/18 2138   12/24/18 2138  Lactic acid, plasma  STAT Now then every 3 hours,   R     12/24/18 2138   12/24/18 2138  Urinalysis, Complete w Microscopic  Once,   STAT     12/24/18 2138   12/24/18 1718  Culture, blood (routine x 2)  BLOOD CULTURE X 2,   STAT     12/24/18 1719          Vitals/Pain Today's Vitals   12/24/18 2235 12/24/18 2250 12/24/18 2251 12/24/18 2300  BP:    109/60  Pulse:  99 98 97  Resp:  20 19 (!) 21  Temp:      TempSrc:      SpO2: 96% 97% 97% 97%    Isolation Precautions Airborne and Contact precautions  Medications Medications  norepinephrine (LEVOPHED) 27m in 2574mpremix  infusion (0 mcg/min Intravenous Stopped 12/24/18 2120)  aztreonam (AZACTAM) 0.5 g in dextrose 5 % 50 mL IVPB (0.5 g Intravenous New Bag/Given 12/24/18 2331)  0.9 %  sodium chloride infusion (has no administration in time range)  vancomycin (VANCOCIN) IVPB 1000 mg/200 mL premix (has no administration in time range)  heparin injection 5,000 Units (5,000 Units Subcutaneous Given 12/24/18 2332)  albuterol (PROVENTIL) (2.5 MG/3ML) 0.083% nebulizer solution 2.5 mg (2.5 mg Nebulization Not Given 12/24/18 2234)  clonazePAM (KLONOPIN) tablet 0.5-1 mg (has no administration in  time range)  docusate sodium (COLACE) capsule 100 mg (has no administration in time range)  FLUoxetine (PROZAC) capsule 10 mg (10 mg Oral Not Given 12/24/18 2213)  insulin detemir (LEVEMIR) injection 15 Units (has no administration in time range)  levalbuterol (XOPENEX) nebulizer solution 0.63 mg (has no administration in time range)  levothyroxine (SYNTHROID) tablet 112 mcg (has no administration in time range)  sevelamer carbonate (RENVELA) tablet 1,600 mg (has no administration in time range)  montelukast (SINGULAIR) tablet 10 mg (10 mg Oral Not Given 12/24/18 2215)  insulin aspart (novoLOG) injection 0-6 Units (has no administration in time range)  insulin aspart (novoLOG) injection 0-5 Units (0 Units Subcutaneous Not Given 12/24/18 2251)  fentaNYL (SUBLIMAZE) injection 12.5 mcg (has no administration in time range)  ipratropium-albuterol (DUONEB) 0.5-2.5 (3) MG/3ML nebulizer solution 3 mL (3 mLs Nebulization Given 12/24/18 2234)  clonazePAM (KLONOPIN) tablet 1 mg (1 mg Oral Given 12/24/18 2326)  vancomycin (VANCOCIN) 2,000 mg in sodium chloride 0.9 % 500 mL IVPB (0 mg Intravenous Stopped 12/24/18 2138)    Mobility non-ambulatory High fall risk   Focused Assessments     R Recommendations: See Admitting Provider Note  Report given to:   Additional Notes:

## 2018-12-24 NOTE — Procedures (Signed)
Arterial Catheter Insertion Procedure Note Annamarie Dechristopher [id number] Oct 02, 1945  Procedure: Insertion of Arterial Catheter  Indications: Blood pressure monitoring  Procedure Details Consent: Risks of procedure as well as the alternatives and risks of each were explained to the (patient/caregiver).  Consent for procedure obtained. Time Out: Verified patient identification, verified procedure, site/side was marked, verified correct patient position, special equipment/implants available, medications/allergies/relevent history reviewed, required imaging and test results available.  Performed  Maximum sterile technique was used including antiseptics, gloves, hand hygiene and mask. Skin prep: Chlorhexidine; local anesthetic administered 20 gauge catheter was inserted into right radial artery using the Seldinger technique. ULTRASOUND GUIDANCE USED: NO Evaluation Blood flow good; BP tracing good. Complications: No apparent complications   A-Line placed Mickeal Needy RT.   Tyniesha Howald 12/24/2018

## 2018-12-24 NOTE — ED Notes (Signed)
Pt c.o lower back pain, edp made aware

## 2018-12-25 ENCOUNTER — Inpatient Hospital Stay (HOSPITAL_COMMUNITY): Payer: Medicare Other

## 2018-12-25 DIAGNOSIS — N186 End stage renal disease: Secondary | ICD-10-CM

## 2018-12-25 DIAGNOSIS — R6521 Severe sepsis with septic shock: Secondary | ICD-10-CM

## 2018-12-25 DIAGNOSIS — G9340 Encephalopathy, unspecified: Secondary | ICD-10-CM

## 2018-12-25 LAB — COMPREHENSIVE METABOLIC PANEL
ALT: 18 U/L (ref 0–44)
AST: 27 U/L (ref 15–41)
Albumin: 3.2 g/dL — ABNORMAL LOW (ref 3.5–5.0)
Alkaline Phosphatase: 104 U/L (ref 38–126)
Anion gap: 22 — ABNORMAL HIGH (ref 5–15)
BUN: 18 mg/dL (ref 8–23)
CO2: 21 mmol/L — ABNORMAL LOW (ref 22–32)
Calcium: 8.3 mg/dL — ABNORMAL LOW (ref 8.9–10.3)
Chloride: 93 mmol/L — ABNORMAL LOW (ref 98–111)
Creatinine, Ser: 4.07 mg/dL — ABNORMAL HIGH (ref 0.44–1.00)
GFR calc Af Amer: 12 mL/min — ABNORMAL LOW (ref >60)
GFR calc non Af Amer: 10 mL/min — ABNORMAL LOW (ref >60)
Glucose, Bld: 150 mg/dL — ABNORMAL HIGH (ref 70–99)
Potassium: 3.9 mmol/L (ref 3.5–5.1)
Sodium: 136 mmol/L (ref 135–145)
Total Bilirubin: 0.7 mg/dL (ref 0.3–1.2)
Total Protein: 6.4 g/dL — ABNORMAL LOW (ref 6.5–8.1)

## 2018-12-25 LAB — CBC
HCT: 39.1 % (ref 36.0–46.0)
Hemoglobin: 12.2 g/dL (ref 12.0–15.0)
MCH: 30.3 pg (ref 26.0–34.0)
MCHC: 31.2 g/dL (ref 30.0–36.0)
MCV: 97.3 fL (ref 80.0–100.0)
Platelets: 139 10*3/uL — ABNORMAL LOW (ref 150–400)
RBC: 4.02 MIL/uL (ref 3.87–5.11)
RDW: 16.7 % — ABNORMAL HIGH (ref 11.5–15.5)
WBC: 21.5 10*3/uL — ABNORMAL HIGH (ref 4.0–10.5)
nRBC: 0.1 % (ref 0.0–0.2)

## 2018-12-25 LAB — GLUCOSE, CAPILLARY
Glucose-Capillary: 112 mg/dL — ABNORMAL HIGH (ref 70–99)
Glucose-Capillary: 155 mg/dL — ABNORMAL HIGH (ref 70–99)
Glucose-Capillary: 87 mg/dL (ref 70–99)
Glucose-Capillary: 90 mg/dL (ref 70–99)
Glucose-Capillary: 93 mg/dL (ref 70–99)

## 2018-12-25 LAB — PHOSPHORUS: Phosphorus: 6 mg/dL — ABNORMAL HIGH (ref 2.5–4.6)

## 2018-12-25 LAB — MRSA PCR SCREENING: MRSA by PCR: NEGATIVE

## 2018-12-25 LAB — MAGNESIUM: Magnesium: 2.2 mg/dL (ref 1.7–2.4)

## 2018-12-25 LAB — LACTIC ACID, PLASMA
Lactic Acid, Venous: 2.1 mmol/L (ref 0.5–1.9)
Lactic Acid, Venous: 0.6 mmol/L (ref 0.5–1.9)

## 2018-12-25 MED ORDER — IPRATROPIUM-ALBUTEROL 0.5-2.5 (3) MG/3ML IN SOLN
3.0000 mL | Freq: Four times a day (QID) | RESPIRATORY_TRACT | Status: DC
  Administered 2018-12-25: 08:00:00 3 mL via RESPIRATORY_TRACT
  Filled 2018-12-25: qty 3

## 2018-12-25 MED ORDER — IPRATROPIUM-ALBUTEROL 0.5-2.5 (3) MG/3ML IN SOLN
3.0000 mL | Freq: Two times a day (BID) | RESPIRATORY_TRACT | Status: DC
  Administered 2018-12-25: 3 mL via RESPIRATORY_TRACT
  Filled 2018-12-25: qty 3

## 2018-12-25 MED ORDER — CHLORHEXIDINE GLUCONATE CLOTH 2 % EX PADS
6.0000 | MEDICATED_PAD | Freq: Every day | CUTANEOUS | Status: DC
  Administered 2018-12-25 – 2018-12-26 (×2): 6 via TOPICAL

## 2018-12-25 MED ORDER — ACETAMINOPHEN 325 MG PO TABS
650.0000 mg | ORAL_TABLET | Freq: Four times a day (QID) | ORAL | Status: DC | PRN
  Administered 2018-12-25 – 2018-12-27 (×5): 650 mg via ORAL
  Filled 2018-12-25 (×5): qty 2

## 2018-12-25 MED ORDER — CLONAZEPAM 1 MG PO TABS
1.0000 mg | ORAL_TABLET | Freq: Every evening | ORAL | Status: DC | PRN
  Administered 2018-12-26: 1 mg via ORAL
  Filled 2018-12-25: qty 1

## 2018-12-25 MED ORDER — LIDOCAINE 5 % EX PTCH
1.0000 | MEDICATED_PATCH | CUTANEOUS | Status: DC
  Administered 2018-12-25 – 2018-12-27 (×3): 1 via TRANSDERMAL
  Filled 2018-12-25 (×3): qty 1

## 2018-12-25 MED ORDER — SODIUM CHLORIDE 0.9 % IV BOLUS
500.0000 mL | Freq: Once | INTRAVENOUS | Status: AC
  Administered 2018-12-25: 04:00:00 500 mL via INTRAVENOUS

## 2018-12-25 NOTE — Progress Notes (Signed)
[facility] Progress Note Patient Name: Joan Mosley DOB: 09-17-1945 MRN: [id number]   Date of Service  12/25/2018  HPI/Events of Note  LA 2.1. 1 lit fluid from ED. Elevated wbc and pro cal. On abx.  On nasal o2. Suspected PNA.  eICU Interventions  - NS 500 ml bolus and follow LA at 5 AM. HD patient. Will not give 30 cc/kg.      Intervention Category Intermediate Interventions: Other:;Infection - evaluation and management  Elmer Sow 12/25/2018, 2:33 AM

## 2018-12-25 NOTE — Evaluation (Signed)
Clinical/Bedside Swallow Evaluation Patient Details  Name: Joan Mosley MRN: [id number] Date of Birth: Apr 27, 1945  Today's Date: 12/25/2018 Time: SLP Start Time (ACUTE ONLY): 1413 SLP Stop Time (ACUTE ONLY): 1437 SLP Time Calculation (min) (ACUTE ONLY): 24 min  Past Medical History:  Past Medical History:  Diagnosis Date  . Arthritis   . COPD (chronic obstructive pulmonary disease) ([place])   . Diabetes mellitus without complication ([place])   . Diabetic neuropathy ([place])   . 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 Renal disorder    ESRD  . Thyroid disease   . TIA (transient ischemic attack)    Past Surgical History:  Past Surgical History:  Procedure Laterality Date  . ABDOMINAL HYSTERECTOMY    . IR FLUORO GUIDE CV LINE RIGHT  11/14/2018  . RECTOCELE REPAIR    . REPLACEMENT TOTAL KNEE BILATERAL Bilateral 2008  . THYROIDECTOMY     80%  . VAGINAL WOUND CLOSURE / REPAIR     HPI:  Patient is a 73 year old female brought to the emergency room after hypotensive episode during dialysis. CT scan of the brain showed consistent with chronic microvascular disease.  CT scan of the abdomen pelvis showed diverticulosis without evidence of diverticulitis with some biliary tract dilation and choledocholithiasis but no clear evidence of cholecystitis.  Chest CT question of early right lower lobe infiltrate on CT scan of the chest. Additional PMH: DM, O2 dependent, TIA. BSE 10/20 with recommedation for reg/thin and MBS if aggressive measures desired- Palliative was following. Pt had pna during that admission.     Assessment / Plan / Recommendation Clinical Impression  Pt presents with increased risk factors for silent aspiration. History of COPD and recent pna (two times this year) and chest CT revealed questionable RLL infiltrate. Respiratory and deglutory coordination present following trials of consecutive straw sips thin. Peaches and solid masticated timely with total of 2 teeth (per son  consumes soft at SNF). Of note her Spo2 fell to 82% (good waveform) after po trials and remained 82-88% although this is not a reliable indicatory of aspiration. Given risk factors and history of pna, recommend instrumental study. Pt preferred to have MBS over FEES which is appropriate assessment. Do recommend she initiate Dys 3, thin, pills whole in applesauce.     SLP Visit Diagnosis: Dysphagia, unspecified (R13.10)    Aspiration Risk  Mild aspiration risk;Moderate aspiration risk    Diet Recommendation Dysphagia 3 (Mech soft);Thin liquid   Liquid Administration via: Straw;Cup Medication Administration: Whole meds with puree Supervision: Patient able to self feed;Staff to assist with self feeding Compensations: Slow rate;Small sips/bites Postural Changes: Seated upright at 90 degrees    Other  Recommendations Oral Care Recommendations: Oral care BID   Follow up Recommendations Other (comment)(TBA)      Frequency and Duration min 2x/week  2 weeks       Prognosis Prognosis for Safe Diet Advancement: (fair-good)      Swallow Study   General HPI: Patient is a 73 year old female brought to the emergency room after hypotensive episode during dialysis. CT scan of the brain showed consistent with chronic microvascular disease.  CT scan of the abdomen pelvis showed diverticulosis without evidence of diverticulitis with some biliary tract dilation and choledocholithiasis but no clear evidence of cholecystitis.  Chest CT question of early right lower lobe infiltrate on CT scan of the chest. Additional PMH: DM, O2 dependent, TIA. BSE 10/20 with recommedation for reg/thin and MBS if aggressive measures desired- Palliative was following.  Pt had pna during that admission.   Type of Study: Bedside Swallow Evaluation Previous Swallow Assessment: see HPI Diet Prior to this Study: NPO Temperature Spikes Noted: No Respiratory Status: Nasal cannula History of Recent Intubation:  No Behavior/Cognition: Alert;Cooperative Oral Cavity Assessment: Within Functional Limits Oral Care Completed by SLP: No Oral Cavity - Dentition: Missing dentition(has 2 teeth total ) Vision: Functional for self-feeding Self-Feeding Abilities: Needs assist Patient Positioning: Other (comment)(mostly upright- chest pain from compressions) Baseline Vocal Quality: Normal Volitional Cough: Weak;Other (Comment)(d/t pain ) Volitional Swallow: Able to elicit    Oral/Motor/Sensory Function Overall Oral Motor/Sensory Function: Within functional limits   Ice Chips Ice chips: Not tested   Thin Liquid Thin Liquid: Within functional limits Presentation: Straw    Nectar Thick Nectar Thick Liquid: Not tested   Honey Thick Honey Thick Liquid: Not tested   Puree Puree: Not tested   Solid     Solid: Within functional limits      [name] 12/25/2018,3:00 PM [name] [place].Ed Risk analyst [phone] Office [phone]

## 2018-12-25 NOTE — Progress Notes (Signed)
PCCM to [facility] transfer:  Patient with h/o TIA, hypothyroidism, ESRD on TTS HD, DM, and COPD on 5L home O2 who presented on 11/28 after an episode of hypotension during HD, giving about 30 seconds of CPR (?lost pulse).  She is chronically on home midodrine and required low-dose norepinephrine yesterday.  Patient is DNR/DNI.  She is on broad spectrum antibiotics pending culture results.  On Levophed, CVL and a-line are out, A&O x2, close to baseline.  Waiting for cultures, likely hypotensive during HD but ruling out sepsis (no PNA on CXR).  Likely to need 1-2 more days of hospitalization and then likely to return home with family.  TRH will assume care on 11/30.   Carlyon Shadow, M.D.

## 2018-12-25 NOTE — Progress Notes (Signed)
Received pt from ED. Levophed off, vss. pt alert talkative with a sense of humor, pt with no insight as reason here, pt confused to day and name of hospital. Pt with rib cage discomfort with repositioning.

## 2018-12-25 NOTE — Progress Notes (Signed)
eLink Physician-Brief Progress Note Patient Name: Joan Mosley DOB: [birth year] MRN: [id number]   Date of Service  12/25/2018  HPI/Events of Note  73 yr old female with hx of severe COPD on home 5 lit o2, recently Dced from [place] home to home, ESRD on HD went into brief CPR for low BP/unresponsiveness during HD today. on weaning down mode of levophed via PIV.  admitted for  1.Septic shock from RLL PNA. Covid r/o. ? per rectal proctitis thickening of wall on CT abdomen.CTH neg. EF normal 08/2018.  2. GB stones. no itis  3. COPD/ESRD on HD.  - received a lit bolus. on antibiotics  Camera: On nasal o2, sats 100%. MAP 77. Off of Levophed. HR sinus.   eICU Interventions  - received a lit of saline. LA 2.1 ESED HD: nephrology to see in AM   K level ok. - elevated LA/pro cal. Suspected PNA. On Vanc/aztreonam pending cultures. - on VTE sq heparin and ssi for DM, goal < 180. - consider surgery eval if suspecting proctitis or peri rectal abscess clinically by bed side team. As per Ed doc, [employer].  Admission notified earlier to [place], Mickel Baas. And discussed with Ed doc.      Intervention Category Major Interventions: Hypotension - evaluation and management;Sepsis - evaluation and management Evaluation Type: New Patient Evaluation  Elmer Sow 12/25/2018, 2:15 AM

## 2018-12-25 NOTE — Progress Notes (Signed)
NAME:  Joan Mosley, MRN:  [id number], DOB:  12-19-1945, LOS: 1 ADMISSION DATE:  12/24/2018, CONSULTATION DATE:  11/28 REFERRING MD:  Ralene Bathe, CHIEF COMPLAINT:  Hypotension  Brief History   The patient is a 73 year old woman who lives with her son and  has end-stage renal disease.  She had dialysis yesterday.  She was brought into the ED after having an episode of decreased responsiveness and concern for possible loss of pulses.  She had brief CPR for about 30 seconds of chest compressions.  She regained consciousness after that.  She did not receive any medications.  On arrival to ED she was found to have an elevated white count at 19,000, some possible diverticulosis without diverticulitis and possible proctitis.  She was admitted to the [facility]M ICU because she was on a low-dose of norepinephrine at 2 mcg, but she is chronically hypotensive on home midodrine.  Patient is a DNR and DO NOT INTUBATE.  She was started empirically on vancomycin and aztreonam blood cultures were sent.  She is not hypoxemic, Covid test was negative.  Consults:  Nephrology  Procedures:  None  Significant Diagnostic Tests:  None  Micro Data:  Blood culture sent 11/28 are pending  Antimicrobials:  11/28>> vancomycin 11/28>> aztreonam  Interim history/subjective:  Overnight no acute issues.  She has been off of norepinephrine support since 9 PM last night  Objective   Blood pressure 119/61, pulse 87, temperature (!) 97.5 F (36.4 C), temperature source Oral, resp. rate 18, height 5\' 1"  (1.549 m), weight 93 kg, SpO2 94 %.       No intake or output data in the 24 hours ending 12/25/18 0927 Filed Weights   12/25/18 0026  Weight: 93 kg    Examination: General: resting comfortably, no distress HENT: at/[place], mmm Lungs: clear to auscultation bilaterally, no increased wob, no wheezes or crackles Cardiovascular: RRR, no mrg Abdomen: soft, nt, nd, she has diffuse chest wall ttp from chest compressions Extremities:  no edema, bilateral venous stasis dermatitis Neuro: oriented to self, time, but not to place or situation. Normal speech MSK: no acute synovitis Lines: A-line and Central Line   Assessment & Plan:  The patient is a 73 year old woman with COPD on home oxygen, as well as end-stage renal disease.  She presented after hypotensive episode during dialysis.  1.  Circulatory shock - resolved 2.  End-stage renal disease 3.  Type 2 diabetes 4.  Chronic respiratory failure secondary to COPD 5. Type 2 DM  She presented from hypotension related to dialysis.  She received brief CPR for 30 seconds.  Unclear if she ever actually lost a pulse.  She now appears to be close to her neurologic baseline.  We will discontinue the central line and arterial line as she was only briefly on pressors for a few hours and that too at a low dose.  Continue broad-spectrum antibiotics while we await culture data.  Continue home oxygen her home inhaler therapy for COPD.  There is no evidence of exacerbation.  Continue her home insulin, it appears she was made n.p.o. due to a failed bedside swallow.  However we will consult SLP and repeat a bedside swallow as able given that her mental status is improved.  She has now stable for regular nursing floor with telemetry.  Best practice:  Diet: currently NPO, will advance as tolerated  Pain/Anxiety/Delirium protocol (if indicated): minimize sedating medcations.  VAP protocol (if indicated): n/a DVT prophylaxis: heparin sub cu GI prophylaxis: n/a Glucose control:  controlled Foley none Mobility: OOB with assistance Code Status: DNR and DNI Family Communication: Will communicate with son Disposition: ok for transfer to [facility]   Labs   I personally reviewed her chest x-ray there is chronic right-sided atelectasis with associated volume loss which is unchanged on her previous x-ray in October.  I do not see any lobar pneumonia. CBC: Recent Labs  Lab 12/24/18 1654 12/24/18 1701  12/25/18 0045  WBC 19.3*  --  21.5*  NEUTROABS 15.6*  --   --   HGB 13.5 15.0  15.6* 12.2  HCT 44.9 44.0  46.0 39.1  MCV 100.2*  --  97.3  PLT 161  --  139*    Basic Metabolic Panel: Recent Labs  Lab 12/24/18 1654 12/24/18 1701 12/25/18 0045  NA 135 132*  132* 136  K 4.2 4.0  4.0 3.9  CL 92* 95* 93*  CO2 20*  --  21*  GLUCOSE 205* 198* 150*  BUN 11 13 18   CREATININE 3.70* 3.40* 4.07*  CALCIUM 8.2*  --  8.3*  MG  --   --  2.2  PHOS  --   --  6.0*   GFR: Estimated Creatinine Clearance: 12.4 mL/min (A) (by C-G formula based on SCr of 4.07 mg/dL (H)). Recent Labs  Lab 12/24/18 1654 12/24/18 2222 12/25/18 0045 12/25/18 0803  PROCALCITON  --  3.28  --   --   WBC 19.3*  --  21.5*  --   LATICACIDVEN  --  1.2 2.1* 0.6    Liver Function Tests: Recent Labs  Lab 12/24/18 1654 12/25/18 0045  AST 28 27  ALT 19 18  ALKPHOS 111 104  BILITOT 1.0 0.7  PROT 7.2 6.4*  ALBUMIN 3.5 3.2*   Recent Labs  Lab 12/24/18 1654  LIPASE 40   No results for input(s): AMMONIA in the last 168 hours.  ABG    Component Value Date/Time   PHART 7.369 11/16/2018 1225   PCO2ART 47.7 11/16/2018 1225   PO2ART 66.2 (L) 11/16/2018 1225   HCO3 21.0 12/24/2018 1701   TCO2 22 12/24/2018 1701   TCO2 23 12/24/2018 1701   ACIDBASEDEF 8.0 (H) 12/24/2018 1701   O2SAT 55.0 12/24/2018 1701     Coagulation Profile: No results for input(s): INR, PROTIME in the last 168 hours.  Cardiac Enzymes: No results for input(s): CKTOTAL, CKMB, CKMBINDEX, TROPONINI in the last 168 hours.  HbA1C: Hgb A1c MFr Bld  Date/Time Value Ref Range Status  11/18/2018 11:20 AM 5.6 4.8 - 5.6 % Final    Comment:    (NOTE) Pre diabetes:          5.7%-6.4% Diabetes:              >6.4% Glycemic control for   <7.0% adults with diabetes   07/01/2018 11:49 AM 6.1 (H) <5.7 % of total Hgb Final    Comment:    For someone without known diabetes, a hemoglobin  A1c value between 5.7% and 6.4% is consistent with  prediabetes and should be confirmed with a  follow-up test. . For someone with known diabetes, a value <7% indicates that their diabetes is well controlled. A1c targets should be individualized based on duration of diabetes, age, comorbid conditions, and other considerations. . This assay result is consistent with an increased risk of diabetes. . Currently, no consensus exists regarding use of hemoglobin A1c for diagnosis of diabetes for children. .     CBG: Recent Labs  Lab 12/24/18 1648 12/24/18 2226  12/25/18 0024 12/25/18 0731  GLUCAP 136* 132* 155* 87    Critical care time:   The patient is critically ill with multiple organ systems failure and requires high complexity decision making for assessment and support, frequent evaluation and titration of therapies, application of advanced monitoring technologies and extensive interpretation of multiple databases.   Critical Care Time devoted to patient care services described in this note is 39 minutes. This time reflects the time of my personal involvement. This critical care time does not reflect separately billable procedures or procedure time, teaching time or supervisory time of PA/NP/Med student/Med Resident etc but could involve care discussion time.  Leone [facility] 12/25/2018 9:27 AM  Pager: [phone] After hours pager: [phone]

## 2018-12-25 NOTE — Progress Notes (Signed)
CRITICAL VALUE ALERT  Critical Value:  Lactic acid 2.1  Date & Time Notied:  12/25/2018 @ 0214  Provider Notified: Warren Lacy notified  Orders Received/Actions taken: MD notified

## 2018-12-26 ENCOUNTER — Inpatient Hospital Stay (HOSPITAL_COMMUNITY): Payer: Medicare Other

## 2018-12-26 DIAGNOSIS — I953 Hypotension of hemodialysis: Principal | ICD-10-CM

## 2018-12-26 DIAGNOSIS — J449 Chronic obstructive pulmonary disease, unspecified: Secondary | ICD-10-CM

## 2018-12-26 DIAGNOSIS — E119 Type 2 diabetes mellitus without complications: Secondary | ICD-10-CM

## 2018-12-26 DIAGNOSIS — E039 Hypothyroidism, unspecified: Secondary | ICD-10-CM

## 2018-12-26 DIAGNOSIS — Z794 Long term (current) use of insulin: Secondary | ICD-10-CM

## 2018-12-26 DIAGNOSIS — R1313 Dysphagia, pharyngeal phase: Secondary | ICD-10-CM

## 2018-12-26 LAB — CBC WITH DIFFERENTIAL/PLATELET
Abs Immature Granulocytes: 0.07 10*3/uL (ref 0.00–0.07)
Basophils Absolute: 0 10*3/uL (ref 0.0–0.1)
Basophils Relative: 0 %
Eosinophils Absolute: 0 10*3/uL (ref 0.0–0.5)
Eosinophils Relative: 0 %
HCT: 32.8 % — ABNORMAL LOW (ref 36.0–46.0)
Hemoglobin: 10.3 g/dL — ABNORMAL LOW (ref 12.0–15.0)
Immature Granulocytes: 1 %
Lymphocytes Relative: 14 %
Lymphs Abs: 1.3 10*3/uL (ref 0.7–4.0)
MCH: 29.9 pg (ref 26.0–34.0)
MCHC: 31.4 g/dL (ref 30.0–36.0)
MCV: 95.3 fL (ref 80.0–100.0)
Monocytes Absolute: 0.7 10*3/uL (ref 0.1–1.0)
Monocytes Relative: 8 %
Neutro Abs: 7 10*3/uL (ref 1.7–7.7)
Neutrophils Relative %: 77 %
Platelets: 120 10*3/uL — ABNORMAL LOW (ref 150–400)
RBC: 3.44 MIL/uL — ABNORMAL LOW (ref 3.87–5.11)
RDW: 16.6 % — ABNORMAL HIGH (ref 11.5–15.5)
WBC: 9.2 10*3/uL (ref 4.0–10.5)
nRBC: 0 % (ref 0.0–0.2)

## 2018-12-26 LAB — GLUCOSE, CAPILLARY
Glucose-Capillary: 86 mg/dL (ref 70–99)
Glucose-Capillary: 105 mg/dL — ABNORMAL HIGH (ref 70–99)
Glucose-Capillary: 94 mg/dL (ref 70–99)
Glucose-Capillary: 125 mg/dL — ABNORMAL HIGH (ref 70–99)

## 2018-12-26 LAB — MAGNESIUM: Magnesium: 2.3 mg/dL (ref 1.7–2.4)

## 2018-12-26 MED ORDER — CHLORHEXIDINE GLUCONATE CLOTH 2 % EX PADS: 6.0000 | MEDICATED_PAD | Freq: Every day | CUTANEOUS | Status: DC

## 2018-12-26 MED ORDER — PRO-STAT SUGAR FREE PO LIQD
30.0000 mL | Freq: Two times a day (BID) | ORAL | Status: DC
  Administered 2018-12-26 – 2018-12-27 (×4): 30 mL via ORAL
  Filled 2018-12-26 (×4): qty 30

## 2018-12-26 MED ORDER — CINACALCET HCL 30 MG PO TABS
30.0000 mg | ORAL_TABLET | ORAL | Status: DC
  Administered 2018-12-27: 30 mg via ORAL
  Filled 2018-12-26: qty 1

## 2018-12-26 MED ORDER — KETOROLAC TROMETHAMINE 15 MG/ML IJ SOLN
7.5000 mg | Freq: Once | INTRAMUSCULAR | Status: AC
  Administered 2018-12-27: 7.5 mg via INTRAVENOUS
  Filled 2018-12-26: qty 1

## 2018-12-26 MED ORDER — CALCITRIOL 0.5 MCG PO CAPS
1.7500 ug | ORAL_CAPSULE | ORAL | Status: DC
  Administered 2018-12-27: 1.75 ug via ORAL
  Filled 2018-12-26: qty 1

## 2018-12-26 NOTE — Consult Note (Signed)
WOC Nurse Consult Note: Patient receiving care in [facility] [phone]. Reason for Consult: MASD  Wound type: MASD, IT to pendulous abdominal fold Dressing procedure/placement/frequency:  Measure and cut length of InterDry to fit in skin folds that have skin breakdown Tuck InterDry fabric into skin folds in a single layer, allow for 2 inches of overhang from skin edges to allow for wicking to occur May remove to bathe; dry area thoroughly and then tuck into affected areas again Do not apply any creams or ointments when using InterDry DO NOT THROW AWAY FOR 5 DAYS unless soiled with stool DO NOT [facility] product, this will inactivate the silver in the material  New sheet of Interdry should be applied after 5 days of use if patient continues to have skin breakdown  Monitor the wound area(s) for worsening of condition such as: Signs/symptoms of infection,  Increase in size,  Development of or worsening of odor, Development of pain, or increased pain at the affected locations.  Notify the medical team if any of these develop.  Thank you for the consult.  Discussed plan of care with the patient and bedside nurse.  [place] nurse will not follow at this time.  Please re-consult the [place] team if needed.  Val Riles, RN, MSN, CWOCN, CNS-BC, pager [phone]

## 2018-12-26 NOTE — Progress Notes (Signed)
Modified Barium Swallow Progress Note  Patient Details  Name: Joan Mosley MRN: [id number] Date of Birth: 05/16/1945  Today's Date: 12/26/2018  Modified Barium Swallow completed.  Full report located under Chart Review in the Imaging Section.  Brief recommendations include the following:  Clinical Impression  Pt exhibits mild pharyngeal dysphagia with silent penetration of thin liquids suspect as a result of COPD and swallow/respiratory incoordination. Thin barium was penetrated to the level of the vocal cords as laryngeal closure delayed. Oral hold and chin tuck allowed for increased control of bolus and laryngeal closure preventing laryngeal compromise during study. From informal observation pt has significantly decreased working memory therefore will need full supervision to carry out chin tuck strategy with liquids. Sign posted on wall in pt's view as reminder. Recommend continue Dys 3, thin liquids, chin tuck with thin and pills whole in applesauce.       Swallow Evaluation Recommendations       SLP Diet Recommendations: Dysphagia 3 (Mech soft) solids;Thin liquid   Liquid Administration via: No straw;Cup   Medication Administration: Whole meds with puree   Supervision: Patient able to self feed;Full supervision/cueing for compensatory strategies   Compensations: Chin tuck;Slow rate;Small sips/bites;Minimize environmental distractions   Postural Changes: Seated Mosley at 90 degrees   Oral Care Recommendations: Oral care BID        [name] 12/26/2018,2:42 PM  [name] [place].Ed Risk analyst [phone] Office [phone]

## 2018-12-26 NOTE — TOC Initial Note (Addendum)
Transition of Care [facility]) - Initial/Assessment Note    Patient Details  Name: Joan Mosley MRN: [id number] Date of Birth: 1945-02-13  [facility] CM/SW Contact:    Bartholomew Crews, RN Phone Number: [phone] 12/26/2018, 11:42 AM  Clinical Narrative:                 Patient from home with son and [facility] services. Recently at [facility] from 10/27-11/16. Chronic O2 at 5L. IV antibiotics. HD TTS. LVM with patient's son with call back for NCM to discuss transition needs. TOC team following for transition needs.     Expected Discharge Plan: [place] Barriers to Discharge: Continued Medical Work up   Patient Goals and CMS Choice        Expected Discharge Plan and Services Expected Discharge Plan: [place] In-house Referral: Clinical Social Work Discharge Planning Services: CM Consult   Living arrangements for the past 2 months: Mobile Home                                      Prior Living Arrangements/Services Living arrangements for the past 2 months: Mobile Home Lives with:: Self, Adult Children              Current home services: DME, Home PT    Activities of Daily Living      Permission Sought/Granted                  Emotional Assessment              Admission diagnosis:  Altered Patient Active Problem List   Diagnosis Date Noted  . Hypotension 12/24/2018  . Aspiration pneumonia of right lower lobe ([place])   . Palliative care encounter   . COPD exacerbation ([place]) 11/11/2018  . TIA (transient ischemic attack)   . HLD (hyperlipidemia)   . Type II diabetes mellitus with renal manifestations ([place])   . Depression with anxiety   . COPD with acute exacerbation ([place]) 09/30/2018  . Acute respiratory failure with hypoxia ([place]) 09/24/2018  . Advanced care planning/counseling discussion   . Goals of care, counseling/discussion   . Palliative care by specialist   . Acute on chronic respiratory  failure with hypoxia and hypercapnia ([place]) 09/15/2018  . Pulmonary edema 09/08/2018  . Respiratory failure ([place]) 09/08/2018  . Weakness   . SDH (subdural hematoma) ([place]) 09/06/2018  . Chronic respiratory failure with hypoxia, on home O2 therapy ([place]) 09/06/2018  . Subdural hematoma ([place]) 08/22/2018  . Elevated troponin 08/22/2018  . ESRD (end stage renal disease) ([place]) 08/22/2018  . Anxiety 07/04/2018  . Counseling regarding advanced directives and goals of care 07/01/2018  . Full code status 07/01/2018  . Drug-induced constipation 07/01/2018  . Hemodialysis-associated hypotension 07/01/2018  . Dialysis patient ([place]) 07/01/2018  . HOH (hard of hearing) 07/01/2018  . Stage 5 chronic kidney disease on chronic dialysis ([place]) 07/01/2018  . Type 2 diabetes mellitus with diabetic polyneuropathy, with long-term current use of insulin ([place]) 07/01/2018  . Hypothyroidism 07/01/2018  . Gait instability 07/01/2018  . Hyperkalemia 04/20/2018   PCP:  Flossie Buffy, NP Pharmacy:   [facility] ([address]), [place] - [phone] [place] ([place]) [place] [address] Phone: [phone] Fax: [phone]     Social Determinants of Health (SDOH) Interventions    Readmission Risk Interventions  Readmission Risk Prevention Plan 10/04/2018  Transportation Screening Complete  Medication Review Press photographer) Complete  PCP or Specialist appointment within 3-5 days of discharge Complete  HRI or [place] Complete  SW Recovery Care/Counseling Consult Complete  Palliative Care Screening Complete  [place] Patient Refused

## 2018-12-26 NOTE — Telephone Encounter (Signed)
LVM for Joan Mosley to call back, need to know when and where pick up this rx last?

## 2018-12-26 NOTE — Telephone Encounter (Signed)
Sree is aware, he is seeing the pt twice a week for 4 more weeks.

## 2018-12-26 NOTE — Progress Notes (Signed)
Pt will not let staff turn her. She states that she hurts from the CPR, but will only take Tylenol. Pt was informed and educated on not lying in one place because that can cause skin breakdown. Pt stated that she understood, but was not going to turn.   Eleanora Neighbor, RN

## 2018-12-26 NOTE — Progress Notes (Addendum)
Marland Kitchen  PROGRESS NOTE    Joan Mosley  [id number] DOB: 09-24-1945 DOA: 12/24/2018 PCP: Flossie Buffy, NP   Brief Narrative:   The patient is a 73 year old woman who lives with her son and  has end-stage renal disease.  She had dialysis yesterday.  She was brought into the ED after having an episode of decreased responsiveness and concern for possible loss of pulses.  She had brief CPR for about 30 seconds of chest compressions.  She regained consciousness after that.  She did not receive any medications.  On arrival to ED she was found to have an elevated white count at 19,000, some possible diverticulosis without diverticulitis and possible proctitis.  She was admitted to the [facility] ICU because she was on a low-dose of norepinephrine at 2 mcg, but she is chronically hypotensive on home midodrine.  Patient is a DNR and DO NOT INTUBATE.  She was started empirically on vancomycin and aztreonam blood cultures were sent.  She is not hypoxemic, Covid test was negative.  11/30: Ok ON. Feeling better. Denies complaints   Assessment & Plan:   Active Problems:   Hypotension  Circulatory shock     - this is presumed secondary to HD session; however, infection has still not been ruled out     - remains on azactam and vanc     - she is afebrile, VS and WBC improved     - Bld Cx is NTD (2 days)     - continue abx through tomorrow; looking for Bld Cx neg x 3 days and then can discontinue  ESRD on HD     - per nephrology; appreciate assistance  DM2     - on levemir, humalog at home     - currently on SSI; continue  Chronic respiratory failure secondary to COPD     - continue PRN xoponex  Dysphagia     - per SLP: Pt exhibits mild pharyngeal dysphagia with silent penetration of thin liquids suspect as a result of COPD and swallow/respiratory incoordination. Thin barium was penetrated to the level of the vocal cords as laryngeal closure delayed. Oral hold and chin tuck allowed for increased  control of bolus and laryngeal closure preventing laryngeal compromise during study. From informal observation pt has significantly decreased working memory therefore will need full supervision to carry out chin tuck strategy with liquids. Sign posted on wall in pt's view as reminder. Recommend continue Dys 3, thin liquids, chin tuck with thin and pills whole in applesauce.     - let's see if we can get her some help at home with this/initial [facility] training with family; will speak with CM  Hypothyroidism     - synthroid  Anxiety/Depression     - prozac     DVT prophylaxis: heparin Code Status: DNR Family Communication: Spoke with son by phone.    Disposition Plan: Home with [place]  Consultants:   PCCM  Antimicrobials:   Vanc, azactam   Subjective: "That sounds great."  Objective: Vitals:   12/25/18 2038 12/25/18 2053 12/26/18 0341 12/26/18 0906  BP:  106/64 118/64 134/67  Pulse:  86 92 88  Resp:  15 15 18   Temp:  98 F (36.7 C) 98.6 F (37 C) 97.6 F (36.4 C)  TempSrc:  Oral Oral Oral  SpO2: 94% 92% 90% 90%  Weight:   92.5 kg   Height:        Intake/Output Summary (Last 24 hours) at 12/26/2018 1554 Last data  filed at 12/26/2018 1300 Gross per 24 hour  Intake 900 ml  Output 0 ml  Net 900 ml   Filed Weights   12/25/18 0026 12/26/18 0341  Weight: 93 kg 92.5 kg    Examination:  General: 73 y.o. female resting in bed in NAD Cardiovascular: RRR, +S1, S2, no m/g/r, equal pulses throughout Respiratory: decreased at bases, no w/r/r, normal WOB GI: BS+, NDNT, no masses noted, no organomegaly noted MSK: No e/c/c Neuro: A&O x 3, no focal deficits Psyc: Appropriate interaction and affect, calm/cooperative   Data Reviewed: I have personally reviewed following labs and imaging studies.  CBC: Recent Labs  Lab 12/24/18 1654 12/24/18 1701 12/25/18 0045 12/26/18 0927  WBC 19.3*  --  21.5* 9.2  NEUTROABS 15.6*  --   --  7.0  HGB 13.5 15.0   15.6* 12.2 10.3*  HCT 44.9  44.0   46.0 39.1 32.8*  MCV 100.2*  --  97.3 95.3  PLT 161  --  139* [id number]*   Basic Metabolic Panel: Recent Labs  Lab 12/24/18 1654 12/24/18 1701 12/25/18 0045 12/26/18 0927  NA 135 132*   132* 136  --   K 4.2 4.0   4.0 3.9  --   CL 92* 95* 93*  --   CO2 20*  --  21*  --   GLUCOSE 205* 198* 150*  --   BUN 11 13 18   --   CREATININE 3.70* 3.40* 4.07*  --   CALCIUM 8.2*  --  8.3*  --   MG  --   --  2.2 2.3  PHOS  --   --  6.0*  --    GFR: Estimated Creatinine Clearance: 12.4 mL/min (A) (by C-G formula based on SCr of 4.07 mg/dL (H)). Liver Function Tests: Recent Labs  Lab 12/24/18 1654 12/25/18 0045  AST 28 27  ALT 19 18  ALKPHOS 111 104  BILITOT 1.0 0.7  PROT 7.2 6.4*  ALBUMIN 3.5 3.2*   Recent Labs  Lab 12/24/18 1654  LIPASE 40   No results for input(s): AMMONIA in the last 168 hours. Coagulation Profile: No results for input(s): INR, PROTIME in the last 168 hours. Cardiac Enzymes: No results for input(s): CKTOTAL, CKMB, CKMBINDEX, TROPONINI in the last 168 hours. BNP (last 3 results) No results for input(s): PROBNP in the last 8760 hours. HbA1C: No results for input(s): HGBA1C in the last 72 hours. CBG: Recent Labs  Lab 12/25/18 1250 12/25/18 1645 12/25/18 2053 12/26/18 0655 12/26/18 1159  GLUCAP 90 93 112* 86 105*   Lipid Profile: No results for input(s): CHOL, HDL, LDLCALC, TRIG, CHOLHDL, LDLDIRECT in the last 72 hours. Thyroid Function Tests: No results for input(s): TSH, T4TOTAL, FREET4, T3FREE, THYROIDAB in the last 72 hours. Anemia Panel: No results for input(s): VITAMINB12, FOLATE, FERRITIN, TIBC, IRON, RETICCTPCT in the last 72 hours. Sepsis Labs: Recent Labs  Lab 12/24/18 2222 12/25/18 0045 12/25/18 0803  PROCALCITON 3.28  --   --   LATICACIDVEN 1.2 2.1* 0.6    Recent Results (from the past 240 hour(s))  Culture, blood (routine x 2)     Status: None (Preliminary result)   Collection Time: 12/24/18  6:20 PM   Specimen: BLOOD    Result Value Ref Range Status   Specimen Description BLOOD LEFT ANTECUBITAL  Final   Special Requests   Final    BOTTLES DRAWN AEROBIC AND ANAEROBIC Blood Culture adequate volume   Culture   Final    NO GROWTH 2  DAYS Performed at [facility], [place] [address], [place], [place] [address]    Report Status PENDING  Incomplete  SARS CORONAVIRUS 2 (TAT 6-24 HRS) Nasopharyngeal Nasopharyngeal Swab     Status: None   Collection Time: 12/24/18  6:20 PM   Specimen: Nasopharyngeal Swab  Result Value Ref Range Status   SARS Coronavirus 2 NEGATIVE NEGATIVE Final    Comment: (NOTE) SARS-CoV-2 target nucleic acids are NOT DETECTED. The SARS-CoV-2 RNA is generally detectable in upper and lower respiratory specimens during the acute phase of infection. Negative results do not preclude SARS-CoV-2 infection, do not rule out co-infections with other pathogens, and should not be used as the sole basis for treatment or other patient management decisions. Negative results must be combined with clinical observations, patient history, and epidemiological information. The expected result is Negative. Fact Sheet for Patients: SugarRoll.be Fact Sheet for Healthcare Providers: https://www.woods-mathews.com/ This test is not yet approved or cleared by the [place] FDA and  has been authorized for detection and/or diagnosis of SARS-CoV-2 by FDA under an Emergency Use Authorization (EUA). This EUA will remain  in effect (meaning this test can be used) for the duration of the COVID-19 declaration under Section 56 4(b)(1) of the Act, 21 U.S.C. section 360bbb-3(b)(1), unless the authorization is terminated or revoked sooner. Performed at [facility], [place] [address], [place], [place] [address]   SARS Coronavirus 2 by RT PCR (hospital order, performed in [facility] hospital lab) Nasopharyngeal Nasopharyngeal Swab     Status: None   Collection Time:  12/24/18 10:22 PM   Specimen: Nasopharyngeal Swab  Result Value Ref Range Status   SARS Coronavirus 2 NEGATIVE NEGATIVE Final    Comment: (NOTE) SARS-CoV-2 target nucleic acids are NOT DETECTED. The SARS-CoV-2 RNA is generally detectable in upper and lower respiratory specimens during the acute phase of infection. The lowest concentration of SARS-CoV-2 viral copies this assay can detect is 250 copies / mL. A negative result does not preclude SARS-CoV-2 infection and should not be used as the sole basis for treatment or other patient management decisions.  A negative result may occur with improper specimen collection / handling, submission of specimen other than nasopharyngeal swab, presence of viral mutation(s) within the areas targeted by this assay, and inadequate number of viral copies (<250 copies / mL). A negative result must be combined with clinical observations, patient history, and epidemiological information. Fact Sheet for Patients:   StrictlyIdeas.no Fact Sheet for Healthcare Providers: BankingDealers.co.za This test is not yet approved or cleared  by the [place] FDA and has been authorized for detection and/or diagnosis of SARS-CoV-2 by FDA under an Emergency Use Authorization (EUA).  This EUA will remain in effect (meaning this test can be used) for the duration of the COVID-19 declaration under Section 564(b)(1) of the Act, 21 U.S.C. section 360bbb-3(b)(1), unless the authorization is terminated or revoked sooner. Performed at [facility], [place] [address] [place], [place] [address]   MRSA PCR Screening     Status: None   Collection Time: 12/25/18 12:26 AM   Specimen: Nasopharyngeal  Result Value Ref Range Status   MRSA by PCR NEGATIVE NEGATIVE Final    Comment:        The GeneXpert MRSA Assay (FDA approved for NASAL specimens only), is one component of a comprehensive MRSA colonization surveillance  program. It is not intended to diagnose MRSA infection nor to guide or monitor treatment for MRSA infections. Performed at [facility], [place] [address], [place], [place] [address]   Culture, blood (routine x 2)     Status: None (Preliminary result)   Collection Time: 12/25/18 12:45 AM   Specimen: BLOOD LEFT HAND  Result Value Ref Range Status   Specimen Description BLOOD LEFT HAND  Final   Special Requests   Final    BOTTLES DRAWN AEROBIC ONLY Blood Culture adequate volume   Culture   Final    NO GROWTH 1 DAY Performed at [facility], [place] [address] [place], [place] [address]    Report Status PENDING  Incomplete      Radiology Studies: Ct Abdomen Pelvis Wo Contrast  Result Date: 12/24/2018 CLINICAL DATA:  Hypotension, altered mental status, possible sepsis EXAM: CT ABDOMEN AND PELVIS WITHOUT CONTRAST TECHNIQUE: Multidetector CT imaging of the abdomen and pelvis was performed following the standard protocol without IV contrast. COMPARISON:  CTA chest 11/10/2018 FINDINGS: Lower chest: Evaluation of the lower lungs is complicated by significant respiratory motion artifact there is some increasing confluent opacity in the right lung base in a region of some additional bronchiectatic changes and scarring as well as calcified pleural plaque. Additional bandlike atelectasis in the right middle lobe. Background of centrilobular and paraseptal emphysema. There is cardiomegaly with coronary artery atherosclerosis, aortic valve calcifications and central pulmonary enlargement, similar to prior. Hepatobiliary: No focal liver lesion. Moderate intra and extrahepatic biliary ductal dilatation with numerous calcified gallstones layering dependently within the gallbladder as well as multiple calcifications seen throughout the intra and extrahepatic biliary tree. Several smaller calcifications are also noted at the region of the ampulla of Vater. No pericholecystic inflammation. Pancreas:  Pancreatic atrophy. No peripancreatic inflammation or ductal dilatation. Spleen: Normal in size without focal abnormality. Adrenals/Urinary Tract: Bilateral renal atrophy. Few hypoattenuating subcentimeter cystic foci in the right kidney. A hyperattenuating 1 cm focus in the interpolar right kidney, indeterminate. No urolithiasis or hydronephrosis. There is irregularity of the bladder wall though the urinary bladder is largely decompressed at the time of exam and therefore poorly evaluated by CT imaging. Stomach/Bowel: Small hiatal hernia. Stomach and duodenal sweep takes a normal course no small bowel dilatation or wall thickening. A normal appendix is visualized. Pancolonic diverticulosis without inflammation to suggest diverticulitis. Some rectal wall thickening and some associated stranding and thickening in the left ischioanal fat. Vascular/Lymphatic: Atherosclerotic plaque within the normal caliber aorta. Additional calcifications throughout the branch vessels. No suspicious or enlarged lymph nodes in the included lymphatic chains. Reproductive: Uterus is surgically absent. Multiple septate cystic foci within the ovaries, indeterminate on this exam. Other: Significant abdominal wall laxity. No free air or free fluid in the abdomen or pelvis. No bowel containing hernias. Musculoskeletal: Multilevel degenerative changes are present in the imaged portions of the spine. Atrophy of the paraspinal and abdominal wall musculature with laxity. Pelvic floor laxity noted as well. Angulation of the coccyx with corticated margins may reflect remote injury. Multilevel degenerative changes are present in the imaged portions of the spine. No acute osseous abnormality or suspicious osseous lesion. IMPRESSION: 1. Moderate intra and extrahepatic biliary ductal dilatation with numerous calcified gallstones layering dependently within the gallbladder as well as multiple calcifications throughout the intra and extrahepatic biliary  tree. Several smaller calcifications are also noted at the region of the ampulla of Vater. Findings compatible with choledocholithiasis. Correlate with serologies. 2. A hyperattenuating 1 cm focus in the interpolar right kidney, indeterminate on this exam. Recommend further evaluation with nonemergent ultrasound. 3. Some rectal wall thickening  and associated stranding and thickening in the left ischioanal fat may represent proctitis and or a developing perianal abscess. 4. Multiple septate cystic foci within the ovaries, indeterminate on this exam. Recommend further evaluation with nonemergent pelvic ultrasound. 5. Chronic scarring and architectural distortion in the right lung base with some pleural calcifications more pronounced than on comparison exam. Some increasing peribronchovascular opacity in that right lower lobe may reflect of infection or aspiration. 6. Cardiomegaly with coronary artery atherosclerosis, aortic valve calcifications and central pulmonary enlargement, similar to prior. 7. Aortic Atherosclerosis (ICD10-I70.0). These results were called by telephone at the time of interpretation on 12/24/2018 at 7:49 pm to provider [facility] , who verbally acknowledged these results. Electronically Signed   By: Lovena Le M.D.   On: 12/24/2018 19:49   Ct Head Wo Contrast  Result Date: 12/24/2018 CLINICAL DATA:  Altered level of consciousness EXAM: CT HEAD WITHOUT CONTRAST TECHNIQUE: Contiguous axial images were obtained from the base of the skull through the vertex without intravenous contrast. COMPARISON:  09/08/2018 FINDINGS: Brain: There is atrophy and chronic small vessel disease changes. No acute intracranial abnormality. Specifically, no hemorrhage, hydrocephalus, mass lesion, acute infarction, or significant intracranial injury. Previously seen bilateral subdural hematomas have resolved. Vascular: No hyperdense vessel or unexpected calcification. Skull: No acute calvarial abnormality.  Sinuses/Orbits: Mucosal thickening in the paranasal sinuses. Other: None IMPRESSION: Atrophy, chronic microvascular disease. No acute intracranial abnormality. Electronically Signed   By: Rolm Baptise M.D.   On: 12/24/2018 19:36   Dg Chest Port 1 View  Result Date: 12/25/2018 CLINICAL DATA:  Possible pneumonia. EXAM: PORTABLE CHEST 1 VIEW COMPARISON:  12/24/2018 FINDINGS: The patient remains rotated to the right with grossly unchanged cardiomediastinal silhouette. A right jugular dialysis catheter terminates over the right atrium. There is mild elevation of the right hemidiaphragm with mildly increased opacity in both lung bases. A small right pleural effusion is not excluded. No overt edema or pneumothorax is identified. IMPRESSION: Rotated examination with mildly increased bibasilar opacities, likely atelectasis. Possible small right pleural effusion. Electronically Signed   By: Logan Bores M.D.   On: 12/25/2018 06:25   Dg Chest Port 1 View  Result Date: 12/24/2018 CLINICAL DATA:  Hypertension.  Acute mental status change. EXAM: PORTABLE CHEST 1 VIEW COMPARISON:  November 21, 2018 FINDINGS: The dialysis catheter on the right is stable. No pneumothorax. No overt edema. Mild atelectasis in the bases. Stable cardiomediastinal silhouette. IMPRESSION: No acute abnormalities. Electronically Signed   By: Dorise Bullion III M.D   On: 12/24/2018 17:18   Dg Swallowing Func-speech Pathology  Result Date: 12/26/2018 Objective Swallowing Evaluation: Type of Study: MBS-Modified Barium Swallow Study  Patient Details Name: Joan Mosley MRN: [id number] Date of Birth: 1945-09-15 Today's Date: 12/26/2018 Time: SLP Start Time (ACUTE ONLY): 1140 -SLP Stop Time (ACUTE ONLY): 1200 SLP Time Calculation (min) (ACUTE ONLY): 20 min Past Medical History: Past Medical History: Diagnosis Date  Arthritis   COPD (chronic obstructive pulmonary disease) ([place])   Diabetes mellitus without complication ([place])   Diabetic neuropathy  ([place])   Oxygen dependent 03/30/2018  5L home O2  Renal disorder   ESRD  Thyroid disease   TIA (transient ischemic attack)  Past Surgical History: Past Surgical History: Procedure Laterality Date  ABDOMINAL HYSTERECTOMY    IR FLUORO GUIDE CV LINE RIGHT  11/14/2018  RECTOCELE REPAIR    REPLACEMENT TOTAL KNEE BILATERAL Bilateral 2008  THYROIDECTOMY    80%  VAGINAL WOUND CLOSURE / REPAIR   HPI: Patient is a 73 year old  female brought to the emergency room after hypotensive episode during dialysis. CT scan of the brain showed consistent with chronic microvascular disease.  CT scan of the abdomen pelvis showed diverticulosis without evidence of diverticulitis with some biliary tract dilation and choledocholithiasis but no clear evidence of cholecystitis.  Chest CT question of early right lower lobe infiltrate on CT scan of the chest. Additional PMH: DM, O2 dependent, TIA. BSE 10/20 with recommedation for reg/thin and MBS if aggressive measures desired- Palliative was following. Pt had pna during that admission.   Subjective: Pt awake, alert, pleasant, participative Assessment / Plan / Recommendation CHL IP CLINICAL IMPRESSIONS 12/26/2018 Clinical Impression Pt exhibits mild pharyngeal dysphagia with silent penetration of thin liquids suspect as a result of COPD and swallow/respiratory incoordination. Thin barium was penetrated to the level of the vocal cords as laryngeal closure delayed. Oral hold and chin tuck allowed for increased control of bolus and laryngeal closure preventing laryngeal compromise during study. From informal observation pt has significantly decreased working memory therefore will need full supervision to carry out chin tuck strategy with liquids. Sign posted on wall in pt's view as reminder. Recommend continue Dys 3, thin liquids, chin tuck with thin and pills whole in applesauce.     SLP Visit Diagnosis Dysphagia, pharyngeal phase (R13.13) Attention and concentration deficit following --  Frontal lobe and executive function deficit following -- Impact on safety and function Mild aspiration risk;Moderate aspiration risk   CHL IP TREATMENT RECOMMENDATION 12/26/2018 Treatment Recommendations Therapy as outlined in treatment plan below   Prognosis 12/26/2018 Prognosis for Safe Diet Advancement (No Data) Barriers to Reach Goals -- Barriers/Prognosis Comment -- CHL IP DIET RECOMMENDATION 12/26/2018 SLP Diet Recommendations Dysphagia 3 (Mech soft) solids;Thin liquid Liquid Administration via No straw;Cup Medication Administration Whole meds with puree Compensations Chin tuck;Slow rate;Small sips/bites;Minimize environmental distractions Postural Changes Seated upright at 90 degrees   CHL IP OTHER RECOMMENDATIONS 12/26/2018 Recommended Consults -- Oral Care Recommendations Oral care BID Other Recommendations --   CHL IP FOLLOW UP RECOMMENDATIONS 12/26/2018 Follow up Recommendations None   CHL IP FREQUENCY AND DURATION 12/26/2018 Speech Therapy Frequency (ACUTE ONLY) min 2x/week Treatment Duration 2 weeks      CHL IP ORAL PHASE 12/26/2018 Oral Phase WFL Oral - Pudding Teaspoon -- Oral - Pudding Cup -- Oral - Honey Teaspoon -- Oral - Honey Cup -- Oral - Nectar Teaspoon -- Oral - Nectar Cup -- Oral - Nectar Straw -- Oral - Thin Teaspoon -- Oral - Thin Cup -- Oral - Thin Straw -- Oral - Puree -- Oral - Mech Soft -- Oral - Regular -- Oral - Multi-Consistency -- Oral - Pill -- Oral Phase - Comment --  CHL IP PHARYNGEAL PHASE 12/26/2018 Pharyngeal Phase Impaired Pharyngeal- Pudding Teaspoon -- Pharyngeal -- Pharyngeal- Pudding Cup -- Pharyngeal -- Pharyngeal- Honey Teaspoon -- Pharyngeal -- Pharyngeal- Honey Cup -- Pharyngeal -- Pharyngeal- Nectar Teaspoon -- Pharyngeal -- Pharyngeal- Nectar Cup -- Pharyngeal -- Pharyngeal- Nectar Straw -- Pharyngeal -- Pharyngeal- Thin Teaspoon -- Pharyngeal -- Pharyngeal- Thin Cup Pharyngeal residue - valleculae;Pharyngeal residue - pyriform;Penetration/Aspiration during swallow  Pharyngeal Material enters airway, remains ABOVE vocal cords and not ejected out Pharyngeal- Thin Straw NT Pharyngeal -- Pharyngeal- Puree -- Pharyngeal -- Pharyngeal- Mechanical Soft -- Pharyngeal -- Pharyngeal- Regular WFL Pharyngeal -- Pharyngeal- Multi-consistency -- Pharyngeal -- Pharyngeal- Pill -- Pharyngeal -- Pharyngeal Comment --  CHL IP CERVICAL ESOPHAGEAL PHASE 12/26/2018 Cervical Esophageal Phase WFL Pudding Teaspoon -- Pudding Cup -- Honey Teaspoon -- Honey Cup --  Nectar Teaspoon -- Nectar Cup -- Nectar Straw -- Thin Teaspoon -- Thin Cup -- Thin Straw -- Puree -- Mechanical Soft -- Regular -- Multi-consistency -- Pill -- Cervical Esophageal Comment -- [name] 12/26/2018, 2:42 PM                Scheduled Meds:  [START ON 12/27/2018] calcitRIOL  1.75 mcg Oral Once per day on Tue Thu Sat   [START ON 12/27/2018] Chlorhexidine Gluconate Cloth  6 each Topical Q0600   [START ON 12/27/2018] cinacalcet  30 mg Oral Once per day on Tue Thu Sat   docusate sodium  100 mg Oral q morning - 10a   feeding supplement (PRO-STAT SUGAR FREE 64)  30 mL Oral BID   FLUoxetine  10 mg Oral Daily   heparin  5,000 Units Subcutaneous Q8H   insulin aspart  0-5 Units Subcutaneous QHS   insulin aspart  0-6 Units Subcutaneous TID WC   levothyroxine  112 mcg Oral Q0600   lidocaine  1 patch Transdermal Q24H   montelukast  10 mg Oral QHS   sevelamer carbonate  1,600 mg Oral TID WC   Continuous Infusions:  sodium chloride     aztreonam 0.5 g (12/26/18 1031)   [START ON 12/27/2018] vancomycin       LOS: 2 days    Time spent: 35 minutes spent in the coordination of care today.    Jonnie Finner, DO [facility] Pager [phone]  If 7PM-7AM, please contact night-coverage [email or web address] Password [facility] 12/26/2018, 3:54 PM

## 2018-12-26 NOTE — Telephone Encounter (Signed)
Joan Mosley is admitted to ICU at this time, so I will not be sending a refill  Until she is discharged home. In the mean time, please inform [name] that he cannot pick up controlled substance at different pharmacies, he can only use her current address, and She can not get this prescription from more than one provider. Who wrote prescription filled on 11/07/2018? What happened to the prescriptions written in June and July? Were they ever filled? Pete please print the report from the other PMP account. I am unable to see it.

## 2018-12-26 NOTE — Telephone Encounter (Signed)
Spoke with Mrs. Poelman, she is not aware of which pharmacy she get refill last.   Left [name] a vm for her to call back [phone]--Mrs. Hook stated she might know where and when they refill this rx.

## 2018-12-26 NOTE — Telephone Encounter (Signed)
Joan Mosley stated that they pick up this rx on 11/07/2018 for 60 tab at [facility] on [address].  Joan Mosley stated this time they are request refill but to send to [facility] instead (changing pharmacy). Please advise  FYI--pt has 2 acct on PMP--if you use [id number] from [facility]--you will see refill pickup on 11/07/2018 but if you use [id number] from [facility]--you will see last refill in 09/2018.

## 2018-12-26 NOTE — Consult Note (Signed)
[facility] Renal Consultation Note    Indication for Consultation:  Management of ESRD/hemodialysis, anemia, hypertension/volume, and secondary hyperparathyroidism. PCP:  HPI: Joan Mosley is a 73 y.o. female with ESRD, T2DM, COPD (on home O2), hypothyroidism who was admitted s/p ?cardiac arrest event at her dialysis unit on 11/28.  Pt unable to remember what happened. She was recently discharged from [facility] and back living with her son. Per notes, she was near the end of her HD session on 11/28 when noted to be unresponsive with shallow breathing. RN unable locate pulse, so CPR initiated. After ~20 seconds, she had ROSC and was awake/speaking normally. EMS called - BP noted to be in 0000000 systolic. She was given 1L fluid and transported to ED. There, she was persistently hypotensive. She was started on levophed. Intake labs showed WBC 19.3, Hgb 13.5, K 4.2, Ca 8.2, LA 1.2, pro-calcitonin 3.28. Cultures collected and she was started on Vanc/Aztreonam. Initial CXR normal. Head CT normal. Chest/abd CT with mult chronic findings - possible proctitis and/or new RLL HCAP. COVID-19 negative.  She was weaned off pressors, now in floor bed. Cultures pending. Denies dyspnea (on chronic O2). Having some mild lower chest pain which she attributes to prior CPR. Denies fever, chills, diarrhea. Her family confirms that she is DNR.  She is due for dialysis tomorrow per her usual TTS schedule.   Past Medical History:  Diagnosis Date  . Arthritis   . COPD (chronic obstructive pulmonary disease) ([place])   . Diabetes mellitus without complication ([place])   . Diabetic neuropathy ([place])   . 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 Renal disorder    ESRD  . Thyroid disease   . TIA (transient ischemic attack)    Past Surgical History:  Procedure Laterality Date  . ABDOMINAL HYSTERECTOMY    . IR FLUORO GUIDE CV LINE RIGHT  11/14/2018  . RECTOCELE REPAIR    . REPLACEMENT TOTAL KNEE BILATERAL  Bilateral 2008  . THYROIDECTOMY     80%  . VAGINAL WOUND CLOSURE / REPAIR     Family History  Problem Relation Age of Onset  . Heart disease Mother   . Hypertension Mother   . Heart disease Father   . Hypertension Father    Social History:  reports that she quit smoking about 37 years ago. She has a 35.00 pack-year smoking history. She has never used smokeless tobacco. She reports that she does not drink alcohol or use drugs.  ROS: As per HPI otherwise negative.  Physical Exam: Vitals:   12/25/18 2038 12/25/18 2053 12/26/18 0341 12/26/18 0906  BP:  106/64 118/64 134/67  Pulse:  86 92 88  Resp:  15 15 18   Temp:  98 F (36.7 C) 98.6 F (37 C) 97.6 F (36.4 C)  TempSrc:  Oral Oral Oral  SpO2: 94% 92% 90% 90%  Weight:   92.5 kg   Height:         General: Overweight woman, well nourished, in no acute distress. On nasal O2. Head: Normocephalic, atraumatic, sclera non-icteric, mucus membranes are moist. Neck: Supple without lymphadenopathy/masses. JVD not elevated. Lungs: Clear bilaterally to auscultation without wheezes, rales, or rhonchi. Breathing is unlabored. Heart: RRR with normal S1, S2. No murmurs, rubs, or gallops appreciated. Abdomen: Soft, non-tender, non-distended with normoactive bowel sounds.  Musculoskeletal:  Strength and tone appear normal for age. Lower extremities: Trace B LE edema. No obvious ischemic changes, no open wounds. Neuro: Alert and oriented X 3. Moves  all extremities spontaneously. Psych:  Responds to questions appropriately with a normal affect. Dialysis Access: [facility]  Allergies  Allergen Reactions  . Avelox [Moxifloxacin Hcl In Nacl] Other (See Comments)    Unknown reaction  . Codeine Anaphylaxis  . Penicillins Rash    Did it involve swelling of the face/tongue/throat, SOB, or low BP? Unk Did it involve sudden or severe rash/hives, skin peeling, or any reaction on the inside of your mouth or nose? Yes Did you need to seek medical attention  at a hospital or doctor's office? Unk When did it last happen? Unk If all above answers are "NO", may proceed with cephalosporin use.   . Sulfa Antibiotics Other (See Comments)    Unknown reaction  . Dextromethorphan-Guaifenesin Other (See Comments)    Reported by [facility] - unknown reaction  . Shellfish Allergy Hives, Itching and Rash   Prior to Admission medications   Medication Sig Start Date End Date Taking? Authorizing Provider  acetaminophen (TYLENOL) 500 MG tablet Take 1,000 mg by mouth every 6 (six) hours as needed (for arthritic pain).    Yes [provider]  clonazePAM (KLONOPIN) 1 MG tablet Take 0.5-1 mg by mouth See admin instructions. Take 0.5-1 mg by mouth in the morning and 1 mg at bedtime   Yes [provider]  docusate sodium (COLACE) 100 MG capsule Take 100 mg by mouth every morning.   Yes [provider]  FLUoxetine (PROZAC) 10 MG tablet Take 1 tablet (10 mg total) by mouth daily. 08/08/18  Yes Nche, Charlene Brooke, NP  furosemide (LASIX) 40 MG tablet Take 80 mg by mouth See admin instructions. Take 80 mg by mouth in the morning on Sun/Mon/Wed/Fri (non-dialysis days) 10/13/18  Yes [provider]  insulin detemir (LEVEMIR) 100 UNIT/ML injection Inject 0.1 mLs (10 Units total) into the skin daily. Patient taking differently: Inject 15 Units into the skin daily before breakfast.  11/22/18  Yes Hosie Poisson, MD  insulin lispro (HUMALOG) 100 UNIT/ML injection Inject 1-2 Units into the skin See admin instructions. Inject 1-2 units into the skin three times a day before meals, per sliding scale: 1 unit for every 50 points above a BGL of 150   Yes [provider]  ipratropium-albuterol (DUONEB) 0.5-2.5 (3) MG/3ML SOLN Take 3 mLs by nebulization See admin instructions. Nebulize and inhale 3 ml's into the lungs every four hours, while awake   Yes [provider]  levalbuterol (XOPENEX HFA) 45 MCG/ACT inhaler Inhale 2 puffs into the  lungs 4 (four) times daily as needed for wheezing or shortness of breath.   Yes [provider]  levothyroxine (SYNTHROID) 112 MCG tablet Take 1 tablet (112 mcg total) by mouth daily before breakfast. 1 AM Patient taking differently: Take 112 mcg by mouth daily before breakfast.  07/04/18  Yes Nche, Charlene Brooke, NP  Lubricants (K-Y LUBRICANT JELLY SENSITIVE EX) Place 1 application into both nostrils as needed (as directed- for lubrication).    Yes [provider]  midodrine (PROAMATINE) 10 MG tablet Take 10 mg by mouth See admin instructions. Take 10 mg by mouth on Tuesday, Thursday, Saturday 45 minutes before dialysis   Yes [provider]  montelukast (SINGULAIR) 10 MG tablet Take 10 mg by mouth at bedtime.    Yes [provider]  NASAL SPRAY SALINE NA Place 1-2 sprays into both nostrils as needed (for congestion- "[employer] Simply Saline" brand).   Yes [provider]  nystatin (MYCOSTATIN/NYSTOP) powder Apply 1 g  topically 2 (two) times daily as needed (as directed- to any rashes).    Yes [provider]  nystatin cream (MYCOSTATIN) Apply 1 application topically 2 (two) times daily as needed for dry skin.    Yes [provider]  OXYGEN Inhale 5-6 L/min into the lungs continuous.   Yes [provider]  oxymetazoline (AFRIN) 0.05 % nasal spray Place 1 spray into both nostrils 2 (two) times daily as needed for congestion.   Yes [provider]  rosuvastatin (CRESTOR) 40 MG tablet Take 40 mg by mouth every morning.    Yes [provider]  senna (SENOKOT) 8.6 MG TABS tablet Take 1 tablet by mouth every morning.   Yes [provider]  sevelamer carbonate (RENVELA) 800 MG tablet Take 2 tablets (1,600 mg total) by mouth 3 (three) times daily with meals. Patient taking differently: Take 800-1,600 mg by mouth See admin instructions. Take 1,600 mg by mouth three times a day with meals and 800-1,600 mg with  each snack 11/21/18  Yes Hosie Poisson, MD  albuterol (PROVENTIL) (2.5 MG/3ML) 0.083% nebulizer solution Take 3 mLs (2.5 mg total) by nebulization 4 (four) times daily. Patient not taking: Reported on 12/24/2018 09/16/18   Mariel Aloe, MD  aztreonam 0.5 g in dextrose 5 % 50 mL Inject 0.5 g into the vein every 12 (twelve) hours. Patient not taking: Reported on 12/24/2018 11/22/18   Hosie Poisson, MD  benzonatate (TESSALON) 200 MG capsule Take 1 capsule (200 mg total) by mouth 3 (three) times daily as needed for cough. Patient not taking: Reported on 12/24/2018 11/21/18   Hosie Poisson, MD  calcitRIOL (ROCALTROL) 0.25 MCG capsule Take 7 capsules (1.75 mcg total) by mouth Every Tuesday,Thursday,and Saturday with dialysis. 11/22/18   Hosie Poisson, MD  cinacalcet (SENSIPAR) 30 MG tablet Take 2 tablets (60 mg total) by mouth Every Tuesday,Thursday,and Saturday with dialysis. 11/22/18   Hosie Poisson, MD  Darbepoetin Alfa (ARANESP) 25 MCG/0.42ML SOSY injection Inject 0.42 mLs (25 mcg total) into the vein every Tuesday with hemodialysis. 11/22/18   Hosie Poisson, MD  lidocaine (LIDODERM) 5 % Place 1 patch onto the skin every 12 (twelve) hours as needed (back pain). Remove & Discard patch within 12 hours or as directed by MD     [provider]  multivitamin (RENA-VIT) TABS tablet Take 1 tablet by mouth at bedtime. Patient not taking: Reported on 12/24/2018 11/21/18   Hosie Poisson, MD  Nutritional Supplements (FEEDING SUPPLEMENT, NEPRO CARB STEADY,) LIQD Take 237 mLs by mouth 2 (two) times daily between meals. Patient not taking: Reported on 12/24/2018 11/22/18   Hosie Poisson, MD  vancomycin (VANCOCIN) 1-5 GM/200ML-% SOLN Inject 200 mLs (1,000 mg total) into the vein Every Tuesday,Thursday,and Saturday with dialysis. Patient not taking: Reported on 12/24/2018 11/22/18   Hosie Poisson, MD   Current Facility-Administered Medications  Medication Dose Route Frequency Provider Last Rate Last Dose   . 0.9 %  sodium chloride infusion   Intra-arterial PRN Quintella Reichert, MD      . acetaminophen (TYLENOL) tablet 650 mg  650 mg Oral Q6H PRN Spero Geralds, MD   650 mg at 12/26/18 0412  . aztreonam (AZACTAM) 0.5 g in dextrose 5 % 50 mL IVPB  0.5 g Intravenous Q12H Quintella Reichert, MD 100 mL/hr at 12/26/18 1031 0.5 g at 12/26/18 1031  . Chlorhexidine Gluconate Cloth 2 % PADS 6 each  6 each Topical Q0600 Shellia Cleverly, MD   6 each at 12/26/18 [phone]  .  clonazePAM (KLONOPIN) tablet 1 mg  1 mg Oral QHS PRN Spero Geralds, MD      . docusate sodium (COLACE) capsule 100 mg  100 mg Oral q morning - 10a Gleason, Otilio Carpen, PA-C   100 mg at 12/26/18 1028  . FLUoxetine (PROZAC) capsule 10 mg  10 mg Oral Daily Gleason, Otilio Carpen, PA-C   10 mg at 12/26/18 1027  . heparin injection 5,000 Units  5,000 Units Subcutaneous Q8H Gleason, Otilio Carpen, PA-C   5,000 Units at 12/26/18 [id number]  . insulin aspart (novoLOG) injection 0-5 Units  0-5 Units Subcutaneous QHS Gleason, Otilio Carpen, PA-C      . insulin aspart (novoLOG) injection 0-6 Units  0-6 Units Subcutaneous TID WC Gleason, Otilio Carpen, PA-C      . levalbuterol Penne Lash) nebulizer solution 0.63 mg  0.63 mg Inhalation QID PRN Gleason, Otilio Carpen, PA-C      . levothyroxine (SYNTHROID) tablet 112 mcg  112 mcg Oral Q0600 Gleason, Otilio Carpen, PA-C   112 mcg at 12/26/18 [id number]  . lidocaine (LIDODERM) 5 % 1 patch  1 patch Transdermal Q24H Spero Geralds, MD   1 patch at 12/26/18 1028  . montelukast (SINGULAIR) tablet 10 mg  10 mg Oral QHS Gleason, Otilio Carpen, PA-C   10 mg at 12/25/18 2241  . sevelamer carbonate (RENVELA) tablet 1,600 mg  1,600 mg Oral TID WC Gleason, Otilio Carpen, PA-C   1,600 mg at 12/26/18 1027  . [START ON 12/27/2018] vancomycin (VANCOCIN) IVPB 1000 mg/200 mL premix  1,000 mg Intravenous Q T,Th,Sa-HD Duanne Limerick, [facility]       Labs: Basic Metabolic Panel: Recent Labs  Lab 12/24/18 1654 12/24/18 1701 12/25/18 0045  NA 135 132*  132* 136  K 4.2 4.0  4.0 3.9  CL 92* 95*  93*  CO2 20*  --  21*  GLUCOSE 205* 198* 150*  BUN 11 13 18   CREATININE 3.70* 3.40* 4.07*  CALCIUM 8.2*  --  8.3*  PHOS  --   --  6.0*   Liver Function Tests: Recent Labs  Lab 12/24/18 1654 12/25/18 0045  AST 28 27  ALT 19 18  ALKPHOS 111 104  BILITOT 1.0 0.7  PROT 7.2 6.4*  ALBUMIN 3.5 3.2*   Recent Labs  Lab 12/24/18 1654  LIPASE 40   CBC: Recent Labs  Lab 12/24/18 1654 12/24/18 1701 12/25/18 0045 12/26/18 0927  WBC 19.3*  --  21.5* 9.2  NEUTROABS 15.6*  --   --  7.0  HGB 13.5 15.0  15.6* 12.2 10.3*  HCT 44.9 44.0  46.0 39.1 32.8*  MCV 100.2*  --  97.3 95.3  PLT 161  --  139* 120*   Cardiac Enzymes: No results for input(s): CKTOTAL, CKMB, CKMBINDEX, TROPONINI in the last 168 hours. CBG: Recent Labs  Lab 12/25/18 1250 12/25/18 1645 12/25/18 2053 12/26/18 0655 12/26/18 1159  GLUCAP 90 93 112* 86 105*   Iron Studies: No results for input(s): IRON, TIBC, TRANSFERRIN, FERRITIN in the last 72 hours. Studies/Results: Ct Abdomen Pelvis Wo Contrast  Result Date: 12/24/2018 CLINICAL DATA:  Hypotension, altered mental status, possible sepsis EXAM: CT ABDOMEN AND PELVIS WITHOUT CONTRAST TECHNIQUE: Multidetector CT imaging of the abdomen and pelvis was performed following the standard protocol without IV contrast. COMPARISON:  CTA chest 11/10/2018 FINDINGS: Lower chest: Evaluation of the lower lungs is complicated by significant respiratory motion artifact there is some increasing confluent opacity in the right lung base in a region of some additional  bronchiectatic changes and scarring as well as calcified pleural plaque. Additional bandlike atelectasis in the right middle lobe. Background of centrilobular and paraseptal emphysema. There is cardiomegaly with coronary artery atherosclerosis, aortic valve calcifications and central pulmonary enlargement, similar to prior. Hepatobiliary: No focal liver lesion. Moderate intra and extrahepatic biliary ductal dilatation with  numerous calcified gallstones layering dependently within the gallbladder as well as multiple calcifications seen throughout the intra and extrahepatic biliary tree. Several smaller calcifications are also noted at the region of the ampulla of Vater. No pericholecystic inflammation. Pancreas: Pancreatic atrophy. No peripancreatic inflammation or ductal dilatation. Spleen: Normal in size without focal abnormality. Adrenals/Urinary Tract: Bilateral renal atrophy. Few hypoattenuating subcentimeter cystic foci in the right kidney. A hyperattenuating 1 cm focus in the interpolar right kidney, indeterminate. No urolithiasis or hydronephrosis. There is irregularity of the bladder wall though the urinary bladder is largely decompressed at the time of exam and therefore poorly evaluated by CT imaging. Stomach/Bowel: Small hiatal hernia. Stomach and duodenal sweep takes a normal course no small bowel dilatation or wall thickening. A normal appendix is visualized. Pancolonic diverticulosis without inflammation to suggest diverticulitis. Some rectal wall thickening and some associated stranding and thickening in the left ischioanal fat. Vascular/Lymphatic: Atherosclerotic plaque within the normal caliber aorta. Additional calcifications throughout the branch vessels. No suspicious or enlarged lymph nodes in the included lymphatic chains. Reproductive: Uterus is surgically absent. Multiple septate cystic foci within the ovaries, indeterminate on this exam. Other: Significant abdominal wall laxity. No free air or free fluid in the abdomen or pelvis. No bowel containing hernias. Musculoskeletal: Multilevel degenerative changes are present in the imaged portions of the spine. Atrophy of the paraspinal and abdominal wall musculature with laxity. Pelvic floor laxity noted as well. Angulation of the coccyx with corticated margins may reflect remote injury. Multilevel degenerative changes are present in the imaged portions of the  spine. No acute osseous abnormality or suspicious osseous lesion. IMPRESSION: 1. Moderate intra and extrahepatic biliary ductal dilatation with numerous calcified gallstones layering dependently within the gallbladder as well as multiple calcifications throughout the intra and extrahepatic biliary tree. Several smaller calcifications are also noted at the region of the ampulla of Vater. Findings compatible with choledocholithiasis. Correlate with serologies. 2. A hyperattenuating 1 cm focus in the interpolar right kidney, indeterminate on this exam. Recommend further evaluation with nonemergent ultrasound. 3. Some rectal wall thickening and associated stranding and thickening in the left ischioanal fat may represent proctitis and or a developing perianal abscess. 4. Multiple septate cystic foci within the ovaries, indeterminate on this exam. Recommend further evaluation with nonemergent pelvic ultrasound. 5. Chronic scarring and architectural distortion in the right lung base with some pleural calcifications more pronounced than on comparison exam. Some increasing peribronchovascular opacity in that right lower lobe may reflect of infection or aspiration. 6. Cardiomegaly with coronary artery atherosclerosis, aortic valve calcifications and central pulmonary enlargement, similar to prior. 7. Aortic Atherosclerosis (ICD10-I70.0). These results were called by telephone at the time of interpretation on 12/24/2018 at 7:49 pm to provider [facility] , who verbally acknowledged these results. Electronically Signed   By: Lovena Le M.D.   On: 12/24/2018 19:49   Ct Head Wo Contrast  Result Date: 12/24/2018 CLINICAL DATA:  Altered level of consciousness EXAM: CT HEAD WITHOUT CONTRAST TECHNIQUE: Contiguous axial images were obtained from the base of the skull through the vertex without intravenous contrast. COMPARISON:  09/08/2018 FINDINGS: Brain: There is atrophy and chronic small vessel disease changes. No acute  intracranial abnormality. Specifically, no hemorrhage, hydrocephalus, mass lesion, acute infarction, or significant intracranial injury. Previously seen bilateral subdural hematomas have resolved. Vascular: No hyperdense vessel or unexpected calcification. Skull: No acute calvarial abnormality. Sinuses/Orbits: Mucosal thickening in the paranasal sinuses. Other: None IMPRESSION: Atrophy, chronic microvascular disease. No acute intracranial abnormality. Electronically Signed   By: Rolm Baptise M.D.   On: 12/24/2018 19:36   Dg Chest Port 1 View  Result Date: 12/25/2018 CLINICAL DATA:  Possible pneumonia. EXAM: PORTABLE CHEST 1 VIEW COMPARISON:  12/24/2018 FINDINGS: The patient remains rotated to the right with grossly unchanged cardiomediastinal silhouette. A right jugular dialysis catheter terminates over the right atrium. There is mild elevation of the right hemidiaphragm with mildly increased opacity in both lung bases. A small right pleural effusion is not excluded. No overt edema or pneumothorax is identified. IMPRESSION: Rotated examination with mildly increased bibasilar opacities, likely atelectasis. Possible small right pleural effusion. Electronically Signed   By: Logan Bores M.D.   On: 12/25/2018 06:25   Dg Chest Port 1 View  Result Date: 12/24/2018 CLINICAL DATA:  Hypertension.  Acute mental status change. EXAM: PORTABLE CHEST 1 VIEW COMPARISON:  November 21, 2018 FINDINGS: The dialysis catheter on the right is stable. No pneumothorax. No overt edema. Mild atelectasis in the bases. Stable cardiomediastinal silhouette. IMPRESSION: No acute abnormalities. Electronically Signed   By: Dorise Bullion III M.D   On: 12/24/2018 17:18    Dialysis Orders:  TTS at [facility], 400/800, EDW 91kg (not reaching), 2K/2Ca, UFP 4, TDC, no heparin - Sensipar 30mg  PO q HD - Mircera 5mcg IV q 2 weeks (last 11/21) - Calcitriol 1.62mcg PO q HD  Assessment/Plan: 1.  ?Cardiac arrest v. syncopal episode d/t low  BP: Quick ROSC 11/28 at HD.  2.  Leukocytosis: CT with possibility proctitis and/or RLL HCAP. BCx negative. On Vanc/Aztreonam. 3.  Hypotension: Chronic issue - worse on admit, improving now. She has some volume on exam - hard to get off d/t BP issues - not reaching outpatient EDW, ?raise. Continue mido. 4.  ESRD:  Will continue HD per usual TTS schedule - next 12/1. No heparin. 5.  Anemia: Hgb 10.3 - has dropped from admit. ?slow bleed v. dilution. Will order FOBT. 6.  Metabolic bone disease: Ca ok, Phos high - continue Renvela as binder. 7.  Nutrition: Alb low - will start pro-stat. 8.  T2DM 9.  Hypothyroidism 10.  DNR/DNI  Veneta Penton, PA-C 12/26/2018, 12:50 PM  [facility] Pager: [phone]

## 2018-12-26 NOTE — TOC Progression Note (Signed)
Transition of Care [facility]) - Progression Note    Patient Details  Name: Joan Mosley MRN: [id number] Date of Birth: 1945/03/28  [facility] CM/SW Contact  Bartholomew Crews, RN Phone Number: [phone] 12/26/2018, 5:08 PM  Clinical Narrative:    Received call back from patient's son, Joan Mosley, who is POA but does not live in [place]. Joan Mosley provided permission to speak with his brother, Joan Mosley, and sister, Joan Mosley, to discuss discharge plans.    Joan Mosley discussed his concerns about current HD center stating that oxygen tanks frequently run out of oxygen, so that patient is hypoxic when arriving at home. Patient now is approved for ambulance transport, however, does not have a hoyer pad, so HD staff unable to assist to HD chair. Ryan asking about options for going to a [place] center.   After speaking with renal navigator, spoke with patient's son, Joan Mosley, and discussed that there are no [place] centers in [place]. Discussed that there needs to be enhanced preparations for patient to return to outpatient dialysis. Unable to use hoyer lift at home d/t home is a mobile home, however, family agreeable to put patient on a hoyer pad for transport.   Patient is not allowed to bring her concentrator from home d/t not [facility] approved, however, allowing tanks to run out of oxygen is not a feasible option, therefore, patient needs to continue with ambulance transport or get a home concentrator that can be used when at dialysis. Renal navigator aware of concerns and assisting with transition needs.   Joan Mosley is active with paitent for [facility] PT, OT, RN. Will need HH orders for resumption of services.   TOC team following for transitioin needs.   Expected Discharge Plan: [place] Barriers to Discharge: Continued Medical Work up  Expected Discharge Plan and Services Expected Discharge Plan: [place] In-house Referral: Clinical Social Work Discharge Planning Services: CM  Consult   Living arrangements for the past 2 months: Mobile Home                                       Social Determinants of Health (SDOH) Interventions    Readmission Risk Interventions Readmission Risk Prevention Plan 10/04/2018  Transportation Screening Complete  Medication Review Press photographer) Complete  PCP or Specialist appointment within 3-5 days of discharge Complete  HRI or [place] Complete  SW Recovery Care/Counseling Consult Complete  Palliative Care Screening Complete  [place] Patient Refused

## 2018-12-27 DIAGNOSIS — Z992 Dependence on renal dialysis: Secondary | ICD-10-CM

## 2018-12-27 DIAGNOSIS — F419 Anxiety disorder, unspecified: Secondary | ICD-10-CM

## 2018-12-27 DIAGNOSIS — R5381 Other malaise: Secondary | ICD-10-CM

## 2018-12-27 DIAGNOSIS — R1310 Dysphagia, unspecified: Secondary | ICD-10-CM

## 2018-12-27 LAB — CBC WITH DIFFERENTIAL/PLATELET
Abs Immature Granulocytes: 0.07 10*3/uL (ref 0.00–0.07)
Abs Immature Granulocytes: 0.08 10*3/uL — ABNORMAL HIGH (ref 0.00–0.07)
Basophils Absolute: 0 10*3/uL (ref 0.0–0.1)
Basophils Absolute: 0 10*3/uL (ref 0.0–0.1)
Basophils Relative: 0 %
Basophils Relative: 0 %
Eosinophils Absolute: 0.1 10*3/uL (ref 0.0–0.5)
Eosinophils Absolute: 0 10*3/uL (ref 0.0–0.5)
Eosinophils Relative: 1 %
Eosinophils Relative: 0 %
HCT: 33.4 % — ABNORMAL LOW (ref 36.0–46.0)
HCT: 32.2 % — ABNORMAL LOW (ref 36.0–46.0)
Hemoglobin: 10.5 g/dL — ABNORMAL LOW (ref 12.0–15.0)
Hemoglobin: 10.2 g/dL — ABNORMAL LOW (ref 12.0–15.0)
Immature Granulocytes: 1 %
Immature Granulocytes: 1 %
Lymphocytes Relative: 19 %
Lymphocytes Relative: 22 %
Lymphs Abs: 2 10*3/uL (ref 0.7–4.0)
Lymphs Abs: 2 10*3/uL (ref 0.7–4.0)
MCH: 30.3 pg (ref 26.0–34.0)
MCH: 30.3 pg (ref 26.0–34.0)
MCHC: 31.4 g/dL (ref 30.0–36.0)
MCHC: 31.7 g/dL (ref 30.0–36.0)
MCV: 96.3 fL (ref 80.0–100.0)
MCV: 95.5 fL (ref 80.0–100.0)
Monocytes Absolute: 0.9 10*3/uL (ref 0.1–1.0)
Monocytes Absolute: 0.8 10*3/uL (ref 0.1–1.0)
Monocytes Relative: 9 %
Monocytes Relative: 9 %
Neutro Abs: 7.4 10*3/uL (ref 1.7–7.7)
Neutro Abs: 6.2 10*3/uL (ref 1.7–7.7)
Neutrophils Relative %: 70 %
Neutrophils Relative %: 68 %
Platelets: 109 10*3/uL — ABNORMAL LOW (ref 150–400)
Platelets: 111 10*3/uL — ABNORMAL LOW (ref 150–400)
RBC: 3.47 MIL/uL — ABNORMAL LOW (ref 3.87–5.11)
RBC: 3.37 MIL/uL — ABNORMAL LOW (ref 3.87–5.11)
RDW: 16.6 % — ABNORMAL HIGH (ref 11.5–15.5)
RDW: 16.6 % — ABNORMAL HIGH (ref 11.5–15.5)
WBC: 10.4 10*3/uL (ref 4.0–10.5)
WBC: 9.1 10*3/uL (ref 4.0–10.5)
nRBC: 0 % (ref 0.0–0.2)
nRBC: 0 % (ref 0.0–0.2)

## 2018-12-27 LAB — RENAL FUNCTION PANEL
Albumin: 2.6 g/dL — ABNORMAL LOW (ref 3.5–5.0)
Anion gap: 20 — ABNORMAL HIGH (ref 5–15)
BUN: 49 mg/dL — ABNORMAL HIGH (ref 8–23)
CO2: 18 mmol/L — ABNORMAL LOW (ref 22–32)
Calcium: 8.7 mg/dL — ABNORMAL LOW (ref 8.9–10.3)
Chloride: 94 mmol/L — ABNORMAL LOW (ref 98–111)
Creatinine, Ser: 6.82 mg/dL — ABNORMAL HIGH (ref 0.44–1.00)
GFR calc Af Amer: 6 mL/min — ABNORMAL LOW (ref >60)
GFR calc non Af Amer: 5 mL/min — ABNORMAL LOW (ref >60)
Glucose, Bld: 92 mg/dL (ref 70–99)
Phosphorus: 7.2 mg/dL — ABNORMAL HIGH (ref 2.5–4.6)
Potassium: 4.8 mmol/L (ref 3.5–5.1)
Sodium: 132 mmol/L — ABNORMAL LOW (ref 135–145)

## 2018-12-27 LAB — HEPATITIS B CORE ANTIBODY, TOTAL: Hep B Core Total Ab: NONREACTIVE

## 2018-12-27 LAB — GLUCOSE, CAPILLARY
Glucose-Capillary: 88 mg/dL (ref 70–99)
Glucose-Capillary: 100 mg/dL — ABNORMAL HIGH (ref 70–99)
Glucose-Capillary: 90 mg/dL (ref 70–99)
Glucose-Capillary: 86 mg/dL (ref 70–99)

## 2018-12-27 LAB — HEPATITIS PANEL, ACUTE
HCV Ab: NONREACTIVE
Hep A IgM: NONREACTIVE
Hep B C IgM: NONREACTIVE
Hepatitis B Surface Ag: NONREACTIVE

## 2018-12-27 LAB — MAGNESIUM: Magnesium: 2.3 mg/dL (ref 1.7–2.4)

## 2018-12-27 MED ORDER — HEPARIN SODIUM (PORCINE) 1000 UNIT/ML IJ SOLN
INTRAMUSCULAR | Status: AC
  Filled 2018-12-27: qty 4

## 2018-12-27 MED ORDER — SODIUM CHLORIDE 0.9 % IV SOLN: 100.0000 mL | INTRAVENOUS | Status: DC | PRN

## 2018-12-27 MED ORDER — PENTAFLUOROPROP-TETRAFLUOROETH EX AERO: 1.0000 "application " | INHALATION_SPRAY | CUTANEOUS | Status: DC | PRN

## 2018-12-27 MED ORDER — LIDOCAINE HCL (PF) 1 % IJ SOLN: 5.0000 mL | INTRAMUSCULAR | Status: DC | PRN

## 2018-12-27 MED ORDER — ALTEPLASE 2 MG IJ SOLR: 2.0000 mg | Freq: Once | INTRAMUSCULAR | Status: DC | PRN

## 2018-12-27 MED ORDER — HEPARIN SODIUM (PORCINE) 1000 UNIT/ML DIALYSIS: 1000.0000 [IU] | INTRAMUSCULAR | Status: DC | PRN

## 2018-12-27 MED ORDER — LIDOCAINE-PRILOCAINE 2.5-2.5 % EX CREA: 1.0000 "application " | TOPICAL_CREAM | CUTANEOUS | Status: DC | PRN

## 2018-12-27 NOTE — Progress Notes (Signed)
Pt is weak, requiring a great deal of help to just adjust on the bed.  Declined to sit fully and declined to try standing.  Pt is a better candidate for rehab but is highly likely to refuse and ask to go directly home.  Follow up with her for acute therapy with a plan to see more often if she is declining help with rehab placement.  12/27/18 1200  PT Visit Information  Last PT Received On 12/27/18  Assistance Needed +2  History of Present Illness 73 y.o. female was admitted after having a hypotensive episode in HD, has chronic history of this.  No other current significant findings.  PMHx:  HD, TIA, hypothyroidism, HLD, DM, obesity, pulm HTN, COPD on O2 at home, chair bound at baseline, depression, PN,   Precautions  Precautions Fall  Precaution Comments pt is dependent for mobility  Restrictions  Weight Bearing Restrictions No  Home Living  Family/patient expects to be discharged to: Unsure  Additional Comments supplemental O2 5L at baseline  Prior Function  Level of Independence Needs assistance  Gait / Transfers Assistance Needed transfers with two children  ADL's / [place] has help with all ADL's   Communication  Communication HOH;Other (comment) (limited awareness of her midline, mobility)  Pain Assessment  Pain Assessment Faces  Faces Pain Scale 4  Pain Location sacral area, L shoulder  Pain Intervention(s) Limited activity within patient's tolerance;Monitored during session;Repositioned  Cognition  Arousal/Alertness Lethargic;Awake/alert  Behavior During Therapy Flat affect  Overall Cognitive Status No family/caregiver present to determine baseline cognitive functioning  General Comments not clear if this is baseline  Upper Extremity Assessment  Upper Extremity Assessment Generalized weakness  Lower Extremity Assessment  Lower Extremity Assessment Generalized weakness  Cervical / Trunk Assessment  Cervical / Trunk Assessment Kyphotic  Bed Mobility   Overal bed mobility Needs Assistance  Bed Mobility Supine to Sit;Sit to Supine  Supine to sit Max assist  Sit to supine Max assist  Transfers  General transfer comment declined to try  Balance  Overall balance assessment Needs assistance  Sitting-balance support Feet supported  Sitting balance-Leahy Scale Zero  General Comments  General comments (skin integrity, edema, etc.) pt is resistant to moving to side of bed, and will need to demonstrate this tolerance for home.  Pt is feeling she should go directly home despite her very low tolerance for sitting, and inability to stand  Exercises  Exercises Other exercises (LE strength was reduced to 2- LLE and RLE was 3)  PT - End of Session  Equipment Utilized During Treatment Oxygen  Activity Tolerance Patient limited by fatigue;Treatment limited secondary to medical complications (Comment)  Patient left in bed;with call bell/phone within reach  Nurse Communication Mobility status  PT Assessment  PT Recommendation/Assessment Patient needs continued PT services  PT Visit Diagnosis Unsteadiness on feet (R26.81);Muscle weakness (generalized) (M62.81);Difficulty in walking, not elsewhere classified (R26.2);Adult, failure to thrive (R62.7)  PT Problem List Decreased strength;Decreased activity tolerance;Decreased range of motion;Decreased balance;Decreased coordination;Decreased mobility;Decreased cognition;Decreased knowledge of use of DME;Decreased safety awareness;Cardiopulmonary status limiting activity;Pain  Barriers to Discharge Decreased caregiver support  Barriers to Discharge Comments has disabled family per pt  PT Plan  PT Frequency (ACUTE ONLY) Min 2X/week  PT Treatment/Interventions (ACUTE ONLY) DME instruction;Gait training;Functional mobility training;Therapeutic activities;Therapeutic exercise;Balance training;Neuromuscular re-education;Patient/family education  AM-PAC PT "6 Clicks" Mobility Outcome Measure (Version 2)  Help  needed turning from your back to your side while in a flat bed without using bedrails? 2  Help needed moving from lying on your back to sitting on the side of a flat bed without using bedrails? 1  Help needed moving to and from a bed to a chair (including a wheelchair)? 1  Help needed standing up from a chair using your arms (e.g., wheelchair or bedside chair)? 1  Help needed to walk in hospital room? 1  Help needed climbing 3-5 steps with a railing?  1  6 Click Score 7  Consider Recommendation of Discharge To: CIR/SNF/LTACH  PT Recommendation  Follow Up Recommendations SNF  PT equipment None recommended by PT  Individuals Consulted  Consulted and Agree with Results and Recommendations Patient  Acute Rehab PT Goals  Patient Stated Goal to get stronger but go home  PT Goal Formulation With patient  Time For Goal Achievement 01/10/19  Potential to Achieve Goals Fair  PT Time Calculation  PT Start Time (ACUTE ONLY) 1100  PT Stop Time (ACUTE ONLY) 1125  PT Time Calculation (min) (ACUTE ONLY) 25 min  PT General Charges  $$ ACUTE PT VISIT 1 Visit  PT Evaluation  $PT Eval Moderate Complexity 1 Mod  PT Treatments  $Therapeutic Activity 8-22 mins  Written Expression  Dominant Hand Right    Joan Mosley, PT MS Acute Rehab Dept. Number: [place] [facility] [place]

## 2018-12-27 NOTE — TOC Transition Note (Signed)
Transition of Care [facility]) - CM/SW Discharge Note   Patient Details  Name: Joan Mosley MRN: [id number] Date of Birth: 1945-04-21  [facility] CM/SW Contact:  Bartholomew Crews, RN Phone Number: [phone] 12/27/2018, 4:50 PM   Clinical Narrative:    Patient to transition home today after dialysis. [place] notified. PTAR arranged for 8pm transport. Spoke with daughter, [name], to advise of PTAR arrangements. Discussed picking up hoyer pad from [place] tomorrow. [name] verbalized understanding. Spoke with renal navigator who will advise outpatient dialysis center of transition home. Patient to be transported by PTAR to outpatient diaysis on Thursday. No further transition needs identified.    Final next level of care: [place] Barriers to Discharge: No Barriers Identified   Patient Goals and CMS Choice Patient states their goals for this hospitalization and ongoing recovery are:: home with family CMS Medicare.gov Compare Post Acute Care list provided to:: Patient Represenative (must comment)(Ryan Hassell Done) Choice offered to / list presented to : Adult Children  Discharge Placement                Patient to be transferred to facility by: [place] Name of family member notified: [name] Patient and family notified of of transfer: 12/27/18  Discharge Plan and Services In-house Referral: Clinical Social Work Discharge Planning Services: CM Consult            DME Arranged: Other see comment(hoyer pad) DME Agency: [employer] Date DME Agency Contacted: 12/27/18 Time DME Agency Contacted: 1500 Representative spoke with at DME Agency: [place]: PT, RN, OT, Speech Therapy [place] Agency: [place] Date [place]: 12/27/18 Time [place]: [phone] Representative spoke with at [place]: [place]) Interventions     Readmission Risk Interventions Readmission Risk Prevention Plan  10/04/2018  Transportation Screening Complete  Medication Review Press photographer) Complete  PCP or Specialist appointment within 3-5 days of discharge Complete  HRI or [place] Complete  SW Recovery Care/Counseling Consult Complete  Palliative Care Screening Complete  Rosa Sanchez Patient Refused

## 2018-12-27 NOTE — Care Management Important Message (Signed)
Important Message  Patient Details  Name: Joan Mosley MRN: [id number] Date of Birth: 27-Mar-1945   Medicare Important Message Given:  Yes  Patient could not sign signed on patient behalf copy left a bedside   Lallie Strahm 12/27/2018, 11:56 AM

## 2018-12-27 NOTE — Discharge Summary (Addendum)
. Physician Discharge Summary  Joan Mosley [id number] DOB: [birth year] DOA: 12/24/2018  PCP: Flossie Buffy, NP  Admit date: 12/24/2018 Discharge date: 12/27/2018  Admitted From: Home Disposition:  Discharged to home.  Recommendations for Outpatient Follow-up:  1. Follow up with PCP in 1 week 2. Please obtain BMP/CBC in one week  Discharge Condition: Stable  CODE STATUS: DNR   Brief/Interim Summary: The patient is a 73 year old woman who lives with her son andhas end-stage renal disease. She had dialysis yesterday. She was brought into the ED after having an episode of decreased responsiveness and concern for possible loss of pulses. She had brief CPR for about 30 seconds of chest compressions. She regained consciousness after that. She did not receive any medications. On arrival to ED she was found to have an elevated white count at 19,000, some possible diverticulosis without diverticulitis and possible proctitis. She was admitted to the 28M ICU because she was on a low-dose of norepinephrine at 2 mcg, but she is chronically hypotensive on home midodrine. Patient is a DNR and DO NOT INTUBATE. She was started empirically on vancomycin and aztreonam blood cultures were sent. She is not hypoxemic, Covid test was negative.  11/30: Ok ON. Feeling better. Denies complaints 12/1: Looks good this AM. Stop abx. No infectious source identified. Home today if we can arrange equipment. Needs proper follow up with full PCP services at discharge.  Discharge Diagnoses:  Active Problems:   Hypotension  Circulatory shock/hypotension - this is presumed secondary to HD session; however, infection has still not been ruled out - remains on azactam and vanc - she is afebrile, VS and WBC improved - Bld Cx is NTD (2 days) - continue abx through tomorrow; looking for Bld Cx neg x 3 days and then can discontinue     - 12/1: no infectious source identified; Bld Cx  NTD x 3 days and CXR clear; d/c abx; she's ok for d/c to home; need full follow up with PCP  ESRD on HD - per nephrology; appreciate assistance  DM2 - on levemir, humalog at home - currently on SSI; continue     - glucose is ok today  Chronic respiratory failure secondary to COPD - continue PRN xoponex     - on home O2, 5L; continue  Dysphagia Debility - per SLP: Pt exhibits mild pharyngeal dysphagia with silent penetration of thin liquids suspect as a result of COPD and swallow/respiratory incoordination. Thin barium was penetrated to the level of the vocal cords as laryngeal closure delayed. Oral hold and chin tuck allowed for increased control of bolus and laryngeal closure preventing laryngeal compromise during study. From informal observation pt has significantly decreased working memory therefore will need full supervision to carry out chin tuck strategy with liquids. Sign posted on wall in pt's view as reminder. Recommend continue Dys 3, thin liquids, chin tuck with thin and pills whole in applesauce. - let's see if we can get her some help at home with this/initial [facility] training with family; will speak with CM     - 12/1: HHPT/OT/SLP requested  Hypothyroidism - synthroid  Anxiety/Depression - continue prozac     - continue outpt follow up  Discharge Instructions   Allergies as of 12/27/2018      Reactions   Avelox [moxifloxacin Hcl In Nacl] Other (See Comments)   Unknown reaction   Codeine Anaphylaxis   Penicillins Rash   Did it involve swelling of the face/tongue/throat, SOB, or low BP? Unk Did it  involve sudden or severe rash/hives, skin peeling, or any reaction on the inside of your mouth or nose? Yes Did you need to seek medical attention at a hospital or doctor's office? Unk When did it last happen? Unk If all above answers are "NO", may proceed with cephalosporin use.   Sulfa Antibiotics Other (See Comments)   Unknown reaction    Dextromethorphan-guaifenesin Other (See Comments)   Reported by [facility] - unknown reaction   Shellfish Allergy Hives, Itching, Rash      Medication List    STOP taking these medications   aztreonam 0.5 g in dextrose 5 % 50 mL   vancomycin 1-5 GM/200ML-% Soln Commonly known as: VANCOCIN     TAKE these medications   acetaminophen 500 MG tablet Commonly known as: TYLENOL Take 1,000 mg by mouth every 6 (six) hours as needed (for arthritic pain).   albuterol (2.5 MG/3ML) 0.083% nebulizer solution Commonly known as: PROVENTIL Take 3 mLs (2.5 mg total) by nebulization 4 (four) times daily.   benzonatate 200 MG capsule Commonly known as: TESSALON Take 1 capsule (200 mg total) by mouth 3 (three) times daily as needed for cough.   calcitRIOL 0.25 MCG capsule Commonly known as: ROCALTROL Take 7 capsules (1.75 mcg total) by mouth Every Tuesday,Thursday,and Saturday with dialysis.   cinacalcet 30 MG tablet Commonly known as: SENSIPAR Take 2 tablets (60 mg total) by mouth Every Tuesday,Thursday,and Saturday with dialysis.   clonazePAM 1 MG tablet Commonly known as: KLONOPIN Take 0.5-1 mg by mouth See admin instructions. Take 0.5-1 mg by mouth in the morning and 1 mg at bedtime   Darbepoetin Alfa 25 MCG/0.42ML Sosy injection Commonly known as: ARANESP Inject 0.42 mLs (25 mcg total) into the vein every Tuesday with hemodialysis.   docusate sodium 100 MG capsule Commonly known as: COLACE Take 100 mg by mouth every morning.   feeding supplement (NEPRO CARB STEADY) Liqd Take 237 mLs by mouth 2 (two) times daily between meals.   FLUoxetine 10 MG tablet Commonly known as: PROZAC Take 1 tablet (10 mg total) by mouth daily.   furosemide 40 MG tablet Commonly known as: LASIX Take 80 mg by mouth See admin instructions. Take 80 mg by mouth in the morning on Sun/Mon/Wed/Fri (non-dialysis days)   insulin detemir 100 UNIT/ML injection Commonly known as: LEVEMIR Inject 0.1 mLs (10  Units total) into the skin daily. What changed:   how much to take  when to take this   insulin lispro 100 UNIT/ML injection Commonly known as: HUMALOG Inject 1-2 Units into the skin See admin instructions. Inject 1-2 units into the skin three times a day before meals, per sliding scale: 1 unit for every 50 points above a BGL of 150   ipratropium-albuterol 0.5-2.5 (3) MG/3ML Soln Commonly known as: DUONEB Take 3 mLs by nebulization See admin instructions. Nebulize and inhale 3 ml's into the lungs every four hours, while awake   K-Y LUBRICANT JELLY SENSITIVE EX Place 1 application into both nostrils as needed (as directed- for lubrication).   levalbuterol 45 MCG/ACT inhaler Commonly known as: XOPENEX HFA Inhale 2 puffs into the lungs 4 (four) times daily as needed for wheezing or shortness of breath.   levothyroxine 112 MCG tablet Commonly known as: SYNTHROID Take 1 tablet (112 mcg total) by mouth daily before breakfast. 1 AM What changed: additional instructions   lidocaine 5 % Commonly known as: LIDODERM Place 1 patch onto the skin every 12 (twelve) hours as needed (back pain). Remove &  Discard patch within 12 hours or as directed by MD   midodrine 10 MG tablet Commonly known as: PROAMATINE Take 10 mg by mouth See admin instructions. Take 10 mg by mouth on Tuesday, Thursday, Saturday 45 minutes before dialysis   montelukast 10 MG tablet Commonly known as: SINGULAIR Take 10 mg by mouth at bedtime.   multivitamin Tabs tablet Take 1 tablet by mouth at bedtime.   NASAL SPRAY SALINE NA Place 1-2 sprays into both nostrils as needed (for congestion- "[employer] Simply Saline" brand).   nystatin powder Commonly known as: MYCOSTATIN/NYSTOP Apply 1 g topically 2 (two) times daily as needed (as directed- to any rashes).   nystatin cream Commonly known as: MYCOSTATIN Apply 1 application topically 2 (two) times daily as needed for dry skin.   OXYGEN Inhale 5-6 L/min into  the lungs continuous.   oxymetazoline 0.05 % nasal spray Commonly known as: AFRIN Place 1 spray into both nostrils 2 (two) times daily as needed for congestion.   rosuvastatin 40 MG tablet Commonly known as: CRESTOR Take 40 mg by mouth every morning.   senna 8.6 MG Tabs tablet Commonly known as: SENOKOT Take 1 tablet by mouth every morning.   sevelamer carbonate 800 MG tablet Commonly known as: RENVELA Take 2 tablets (1,600 mg total) by mouth 3 (three) times daily with meals. What changed:   how much to take  when to take this  additional instructions            Durable Medical Equipment  (From admission, onward)         Start     Ordered   12/27/18 1450  For home use only DME Other see comment  Once    Comments: Hoyer pad  Question:  Length of Need  Answer:  Lifetime   12/27/18 1451          Allergies  Allergen Reactions  . Avelox [Moxifloxacin Hcl In Nacl] Other (See Comments)    Unknown reaction  . Codeine Anaphylaxis  . Penicillins Rash    Did it involve swelling of the face/tongue/throat, SOB, or low BP? Unk Did it involve sudden or severe rash/hives, skin peeling, or any reaction on the inside of your mouth or nose? Yes Did you need to seek medical attention at a hospital or doctor's office? Unk When did it last happen? Unk If all above answers are "NO", may proceed with cephalosporin use.   . Sulfa Antibiotics Other (See Comments)    Unknown reaction  . Dextromethorphan-Guaifenesin Other (See Comments)    Reported by [facility] - unknown reaction  . Shellfish Allergy Hives, Itching and Rash    Consultations:  PCCM   Procedures/Studies: Ct Abdomen Pelvis Wo Contrast  Result Date: 12/24/2018 CLINICAL DATA:  Hypotension, altered mental status, possible sepsis EXAM: CT ABDOMEN AND PELVIS WITHOUT CONTRAST TECHNIQUE: Multidetector CT imaging of the abdomen and pelvis was performed following the standard protocol without IV contrast. COMPARISON:   CTA chest 11/10/2018 FINDINGS: Lower chest: Evaluation of the lower lungs is complicated by significant respiratory motion artifact there is some increasing confluent opacity in the right lung base in a region of some additional bronchiectatic changes and scarring as well as calcified pleural plaque. Additional bandlike atelectasis in the right middle lobe. Background of centrilobular and paraseptal emphysema. There is cardiomegaly with coronary artery atherosclerosis, aortic valve calcifications and central pulmonary enlargement, similar to prior. Hepatobiliary: No focal liver lesion. Moderate intra and extrahepatic biliary ductal dilatation with numerous calcified  gallstones layering dependently within the gallbladder as well as multiple calcifications seen throughout the intra and extrahepatic biliary tree. Several smaller calcifications are also noted at the region of the ampulla of Vater. No pericholecystic inflammation. Pancreas: Pancreatic atrophy. No peripancreatic inflammation or ductal dilatation. Spleen: Normal in size without focal abnormality. Adrenals/Urinary Tract: Bilateral renal atrophy. Few hypoattenuating subcentimeter cystic foci in the right kidney. A hyperattenuating 1 cm focus in the interpolar right kidney, indeterminate. No urolithiasis or hydronephrosis. There is irregularity of the bladder wall though the urinary bladder is largely decompressed at the time of exam and therefore poorly evaluated by CT imaging. Stomach/Bowel: Small hiatal hernia. Stomach and duodenal sweep takes a normal course no small bowel dilatation or wall thickening. A normal appendix is visualized. Pancolonic diverticulosis without inflammation to suggest diverticulitis. Some rectal wall thickening and some associated stranding and thickening in the left ischioanal fat. Vascular/Lymphatic: Atherosclerotic plaque within the normal caliber aorta. Additional calcifications throughout the branch vessels. No suspicious or  enlarged lymph nodes in the included lymphatic chains. Reproductive: Uterus is surgically absent. Multiple septate cystic foci within the ovaries, indeterminate on this exam. Other: Significant abdominal wall laxity. No free air or free fluid in the abdomen or pelvis. No bowel containing hernias. Musculoskeletal: Multilevel degenerative changes are present in the imaged portions of the spine. Atrophy of the paraspinal and abdominal wall musculature with laxity. Pelvic floor laxity noted as well. Angulation of the coccyx with corticated margins may reflect remote injury. Multilevel degenerative changes are present in the imaged portions of the spine. No acute osseous abnormality or suspicious osseous lesion. IMPRESSION: 1. Moderate intra and extrahepatic biliary ductal dilatation with numerous calcified gallstones layering dependently within the gallbladder as well as multiple calcifications throughout the intra and extrahepatic biliary tree. Several smaller calcifications are also noted at the region of the ampulla of Vater. Findings compatible with choledocholithiasis. Correlate with serologies. 2. A hyperattenuating 1 cm focus in the interpolar right kidney, indeterminate on this exam. Recommend further evaluation with nonemergent ultrasound. 3. Some rectal wall thickening and associated stranding and thickening in the left ischioanal fat may represent proctitis and or a developing perianal abscess. 4. Multiple septate cystic foci within the ovaries, indeterminate on this exam. Recommend further evaluation with nonemergent pelvic ultrasound. 5. Chronic scarring and architectural distortion in the right lung base with some pleural calcifications more pronounced than on comparison exam. Some increasing peribronchovascular opacity in that right lower lobe may reflect of infection or aspiration. 6. Cardiomegaly with coronary artery atherosclerosis, aortic valve calcifications and central pulmonary enlargement, similar  to prior. 7. Aortic Atherosclerosis (ICD10-I70.0). These results were called by telephone at the time of interpretation on 12/24/2018 at 7:49 pm to provider [facility] , who verbally acknowledged these results. Electronically Signed   By: Lovena Le M.D.   On: 12/24/2018 19:49   Ct Head Wo Contrast  Result Date: 12/24/2018 CLINICAL DATA:  Altered level of consciousness EXAM: CT HEAD WITHOUT CONTRAST TECHNIQUE: Contiguous axial images were obtained from the base of the skull through the vertex without intravenous contrast. COMPARISON:  09/08/2018 FINDINGS: Brain: There is atrophy and chronic small vessel disease changes. No acute intracranial abnormality. Specifically, no hemorrhage, hydrocephalus, mass lesion, acute infarction, or significant intracranial injury. Previously seen bilateral subdural hematomas have resolved. Vascular: No hyperdense vessel or unexpected calcification. Skull: No acute calvarial abnormality. Sinuses/Orbits: Mucosal thickening in the paranasal sinuses. Other: None IMPRESSION: Atrophy, chronic microvascular disease. No acute intracranial abnormality. Electronically Signed   By: Lennette Bihari  [place] M.D.   On: 12/24/2018 19:36   Dg Chest Port 1 View  Result Date: 12/25/2018 CLINICAL DATA:  Possible pneumonia. EXAM: PORTABLE CHEST 1 VIEW COMPARISON:  12/24/2018 FINDINGS: The patient remains rotated to the right with grossly unchanged cardiomediastinal silhouette. A right jugular dialysis catheter terminates over the right atrium. There is mild elevation of the right hemidiaphragm with mildly increased opacity in both lung bases. A small right pleural effusion is not excluded. No overt edema or pneumothorax is identified. IMPRESSION: Rotated examination with mildly increased bibasilar opacities, likely atelectasis. Possible small right pleural effusion. Electronically Signed   By: Logan Bores M.D.   On: 12/25/2018 06:25   Dg Chest Port 1 View  Result Date: 12/24/2018 CLINICAL  DATA:  Hypertension.  Acute mental status change. EXAM: PORTABLE CHEST 1 VIEW COMPARISON:  November 21, 2018 FINDINGS: The dialysis catheter on the right is stable. No pneumothorax. No overt edema. Mild atelectasis in the bases. Stable cardiomediastinal silhouette. IMPRESSION: No acute abnormalities. Electronically Signed   By: Dorise Bullion III M.D   On: 12/24/2018 17:18   Dg Swallowing Func-speech Pathology  Result Date: 12/26/2018 Objective Swallowing Evaluation: Type of Study: MBS-Modified Barium Swallow Study  Patient Details Name: Lakaisha Ebanks MRN: [id number] Date of Birth: 10/25/1945 Today's Date: 12/26/2018 Time: SLP Start Time (ACUTE ONLY): 1140 -SLP Stop Time (ACUTE ONLY): 1200 SLP Time Calculation (min) (ACUTE ONLY): 20 min Past Medical History: Past Medical History: Diagnosis Date . Arthritis  . COPD (chronic obstructive pulmonary disease) ([place])  . Diabetes mellitus without complication ([place])  . Diabetic neuropathy ([place])  . Oxygen dependent 03/30/2018  5L home O2 . Renal disorder   ESRD . Thyroid disease  . TIA (transient ischemic attack)  Past Surgical History: Past Surgical History: Procedure Laterality Date . ABDOMINAL HYSTERECTOMY   . IR FLUORO GUIDE CV LINE RIGHT  11/14/2018 . RECTOCELE REPAIR   . REPLACEMENT TOTAL KNEE BILATERAL Bilateral 2008 . THYROIDECTOMY    80% . VAGINAL WOUND CLOSURE / REPAIR   HPI: Patient is a 73 year old female brought to the emergency room after hypotensive episode during dialysis. CT scan of the brain showed consistent with chronic microvascular disease.  CT scan of the abdomen pelvis showed diverticulosis without evidence of diverticulitis with some biliary tract dilation and choledocholithiasis but no clear evidence of cholecystitis.  Chest CT question of early right lower lobe infiltrate on CT scan of the chest. Additional PMH: DM, O2 dependent, TIA. BSE 10/20 with recommedation for reg/thin and MBS if aggressive measures desired- Palliative was following. Pt  had pna during that admission.   Subjective: Pt awake, alert, pleasant, participative Assessment / Plan / Recommendation CHL IP CLINICAL IMPRESSIONS 12/26/2018 Clinical Impression Pt exhibits mild pharyngeal dysphagia with silent penetration of thin liquids suspect as a result of COPD and swallow/respiratory incoordination. Thin barium was penetrated to the level of the vocal cords as laryngeal closure delayed. Oral hold and chin tuck allowed for increased control of bolus and laryngeal closure preventing laryngeal compromise during study. From informal observation pt has significantly decreased working memory therefore will need full supervision to carry out chin tuck strategy with liquids. Sign posted on wall in pt's view as reminder. Recommend continue Dys 3, thin liquids, chin tuck with thin and pills whole in applesauce.     SLP Visit Diagnosis Dysphagia, pharyngeal phase (R13.13) Attention and concentration deficit following -- Frontal lobe and executive function deficit following -- Impact on safety and function Mild aspiration risk;Moderate aspiration risk  CHL IP TREATMENT RECOMMENDATION 12/26/2018 Treatment Recommendations Therapy as outlined in treatment plan below   Prognosis 12/26/2018 Prognosis for Safe Diet Advancement (No Data) Barriers to Reach Goals -- Barriers/Prognosis Comment -- CHL IP DIET RECOMMENDATION 12/26/2018 SLP Diet Recommendations Dysphagia 3 (Mech soft) solids;Thin liquid Liquid Administration via No straw;Cup Medication Administration Whole meds with puree Compensations Chin tuck;Slow rate;Small sips/bites;Minimize environmental distractions Postural Changes Seated upright at 90 degrees   CHL IP OTHER RECOMMENDATIONS 12/26/2018 Recommended Consults -- Oral Care Recommendations Oral care BID Other Recommendations --   CHL IP FOLLOW UP RECOMMENDATIONS 12/26/2018 Follow up Recommendations None   CHL IP FREQUENCY AND DURATION 12/26/2018 Speech Therapy Frequency (ACUTE ONLY) min 2x/week  Treatment Duration 2 weeks      CHL IP ORAL PHASE 12/26/2018 Oral Phase WFL Oral - Pudding Teaspoon -- Oral - Pudding Cup -- Oral - Honey Teaspoon -- Oral - Honey Cup -- Oral - Nectar Teaspoon -- Oral - Nectar Cup -- Oral - Nectar Straw -- Oral - Thin Teaspoon -- Oral - Thin Cup -- Oral - Thin Straw -- Oral - Puree -- Oral - Mech Soft -- Oral - Regular -- Oral - Multi-Consistency -- Oral - Pill -- Oral Phase - Comment --  CHL IP PHARYNGEAL PHASE 12/26/2018 Pharyngeal Phase Impaired Pharyngeal- Pudding Teaspoon -- Pharyngeal -- Pharyngeal- Pudding Cup -- Pharyngeal -- Pharyngeal- Honey Teaspoon -- Pharyngeal -- Pharyngeal- Honey Cup -- Pharyngeal -- Pharyngeal- Nectar Teaspoon -- Pharyngeal -- Pharyngeal- Nectar Cup -- Pharyngeal -- Pharyngeal- Nectar Straw -- Pharyngeal -- Pharyngeal- Thin Teaspoon -- Pharyngeal -- Pharyngeal- Thin Cup Pharyngeal residue - valleculae;Pharyngeal residue - pyriform;Penetration/Aspiration during swallow Pharyngeal Material enters airway, remains ABOVE vocal cords and not ejected out Pharyngeal- Thin Straw NT Pharyngeal -- Pharyngeal- Puree -- Pharyngeal -- Pharyngeal- Mechanical Soft -- Pharyngeal -- Pharyngeal- Regular WFL Pharyngeal -- Pharyngeal- Multi-consistency -- Pharyngeal -- Pharyngeal- Pill -- Pharyngeal -- Pharyngeal Comment --  CHL IP CERVICAL ESOPHAGEAL PHASE 12/26/2018 Cervical Esophageal Phase WFL Pudding Teaspoon -- Pudding Cup -- Honey Teaspoon -- Honey Cup -- Nectar Teaspoon -- Nectar Cup -- Nectar Straw -- Thin Teaspoon -- Thin Cup -- Thin Straw -- Puree -- Mechanical Soft -- Regular -- Multi-consistency -- Pill -- Cervical Esophageal Comment -- [name] 12/26/2018, 2:42 PM                Subjective: "It'll happen today?"  Discharge Exam: Vitals:   12/27/18 0520 12/27/18 0833  BP: 103/69 109/67  Pulse: 87 85  Resp: 15 16  Temp: (!) 97.5 F (36.4 C) (!) 97.5 F (36.4 C)  SpO2: 96% 94%   Vitals:   12/26/18 1630 12/26/18 2042 12/27/18 0520  12/27/18 0833  BP: 138/79 130/72 103/69 109/67  Pulse: 86 (!) 102 87 85  Resp: 18 18 15 16   Temp: 99 F (37.2 C) 98 F (36.7 C) (!) 97.5 F (36.4 C) (!) 97.5 F (36.4 C)  TempSrc: Oral Oral Oral Oral  SpO2: 91% 90% 96% 94%  Weight:   93.9 kg   Height:        General: 73 y.o. female resting in bed in NAD Cardiovascular: RRR, +S1, S2, no m/g/r Respiratory: CTABL, no w/r/r GI: BS+, NDNT, soft, obese MSK: No e/c/c Neuro: A&O x 3, no focal deficits Psyc: Appropriate interaction and affect, calm/cooperative    The results of significant diagnostics from this hospitalization (including imaging, microbiology, ancillary and laboratory) are listed below for reference.     Microbiology: Recent Results (from the past  240 hour(s))  Culture, blood (routine x 2)     Status: None (Preliminary result)   Collection Time: 12/24/18  6:20 PM   Specimen: BLOOD  Result Value Ref Range Status   Specimen Description BLOOD LEFT ANTECUBITAL  Final   Special Requests   Final    BOTTLES DRAWN AEROBIC AND ANAEROBIC Blood Culture adequate volume   Culture   Final    NO GROWTH 3 DAYS Performed at [facility], [place] [address], [place], [place] [address]    Report Status PENDING  Incomplete  SARS CORONAVIRUS 2 (TAT 6-24 HRS) Nasopharyngeal Nasopharyngeal Swab     Status: None   Collection Time: 12/24/18  6:20 PM   Specimen: Nasopharyngeal Swab  Result Value Ref Range Status   SARS Coronavirus 2 NEGATIVE NEGATIVE Final    Comment: (NOTE) SARS-CoV-2 target nucleic acids are NOT DETECTED. The SARS-CoV-2 RNA is generally detectable in upper and lower respiratory specimens during the acute phase of infection. Negative results do not preclude SARS-CoV-2 infection, do not rule out co-infections with other pathogens, and should not be used as the sole basis for treatment or other patient management decisions. Negative results must be combined with clinical observations, patient history, and  epidemiological information. The expected result is Negative. Fact Sheet for Patients: SugarRoll.be Fact Sheet for Healthcare Providers: https://www.woods-mathews.com/ This test is not yet approved or cleared by the [place] FDA and  has been authorized for detection and/or diagnosis of SARS-CoV-2 by FDA under an Emergency Use Authorization (EUA). This EUA will remain  in effect (meaning this test can be used) for the duration of the COVID-19 declaration under Section 56 4(b)(1) of the Act, 21 U.S.C. section 360bbb-3(b)(1), unless the authorization is terminated or revoked sooner. Performed at [facility], [place] [address] [place], [place] [address]   SARS Coronavirus 2 by RT PCR (hospital order, performed in [facility] hospital lab) Nasopharyngeal Nasopharyngeal Swab     Status: None   Collection Time: 12/24/18 10:22 PM   Specimen: Nasopharyngeal Swab  Result Value Ref Range Status   SARS Coronavirus 2 NEGATIVE NEGATIVE Final    Comment: (NOTE) SARS-CoV-2 target nucleic acids are NOT DETECTED. The SARS-CoV-2 RNA is generally detectable in upper and lower respiratory specimens during the acute phase of infection. The lowest concentration of SARS-CoV-2 viral copies this assay can detect is 250 copies / mL. A negative result does not preclude SARS-CoV-2 infection and should not be used as the sole basis for treatment or other patient management decisions.  A negative result may occur with improper specimen collection / handling, submission of specimen other than nasopharyngeal swab, presence of viral mutation(s) within the areas targeted by this assay, and inadequate number of viral copies (<250 copies / mL). A negative result must be combined with clinical observations, patient history, and epidemiological information. Fact Sheet for Patients:   StrictlyIdeas.no Fact Sheet for Healthcare  Providers: BankingDealers.co.za This test is not yet approved or cleared  by the [place] FDA and has been authorized for detection and/or diagnosis of SARS-CoV-2 by FDA under an Emergency Use Authorization (EUA).  This EUA will remain in effect (meaning this test can be used) for the duration of the COVID-19 declaration under Section 564(b)(1) of the Act, 21 U.S.C. section 360bbb-3(b)(1), unless the authorization is terminated or revoked sooner. Performed at [facility], [place] [address], [place], [place] [address]   MRSA PCR Screening     Status: None   Collection  Time: 12/25/18 12:26 AM   Specimen: Nasopharyngeal  Result Value Ref Range Status   MRSA by PCR NEGATIVE NEGATIVE Final    Comment:        The GeneXpert MRSA Assay (FDA approved for NASAL specimens only), is one component of a comprehensive MRSA colonization surveillance program. It is not intended to diagnose MRSA infection nor to guide or monitor treatment for MRSA infections. Performed at [facility], [place] [address] [place], [place] [address]   Culture, blood (routine x 2)     Status: None (Preliminary result)   Collection Time: 12/25/18 12:45 AM   Specimen: BLOOD LEFT HAND  Result Value Ref Range Status   Specimen Description BLOOD LEFT HAND  Final   Special Requests   Final    BOTTLES DRAWN AEROBIC ONLY Blood Culture adequate volume   Culture   Final    NO GROWTH 2 DAYS Performed at [facility], [place] [address], [place], [place] [address]    Report Status PENDING  Incomplete     Labs: BNP (last 3 results) Recent Labs    09/09/18 0031 11/10/18 1706  BNP 312.3* 123XX123*   Basic Metabolic Panel: Recent Labs  Lab 12/24/18 1654 12/24/18 1701 12/25/18 0045 12/26/18 0927 12/27/18 0400  NA 135 132*  132* 136  --  132*  K 4.2 4.0  4.0 3.9  --  4.8  CL 92* 95* 93*  --  94*  CO2 20*  --  21*  --  18*  GLUCOSE 205* 198* 150*  --  92  BUN 11 13 18   --   49*  CREATININE 3.70* 3.40* 4.07*  --  6.82*  CALCIUM 8.2*  --  8.3*  --  8.7*  MG  --   --  2.2 2.3 2.3  PHOS  --   --  6.0*  --  7.2*   Liver Function Tests: Recent Labs  Lab 12/24/18 1654 12/25/18 0045 12/27/18 0400  AST 28 27  --   ALT 19 18  --   ALKPHOS 111 104  --   BILITOT 1.0 0.7  --   PROT 7.2 6.4*  --   ALBUMIN 3.5 3.2* 2.6*   Recent Labs  Lab 12/24/18 1654  LIPASE 40   No results for input(s): AMMONIA in the last 168 hours. CBC: Recent Labs  Lab 12/24/18 1654 12/24/18 1701 12/25/18 0045 12/26/18 0927 12/27/18 0400 12/27/18 1415  WBC 19.3*  --  21.5* 9.2 10.4 9.1  NEUTROABS 15.6*  --   --  7.0 7.4 6.2  HGB 13.5 15.0  15.6* 12.2 10.3* 10.5* 10.2*  HCT 44.9 44.0  46.0 39.1 32.8* 33.4* 32.2*  MCV 100.2*  --  97.3 95.3 96.3 95.5  PLT 161  --  139* 120* 109* 111*   Cardiac Enzymes: No results for input(s): CKTOTAL, CKMB, CKMBINDEX, TROPONINI in the last 168 hours. BNP: Invalid input(s): POCBNP CBG: Recent Labs  Lab 12/26/18 1159 12/26/18 1626 12/26/18 2044 12/27/18 0715 12/27/18 1126  GLUCAP 105* 94 125* 88 100*   D-Dimer No results for input(s): DDIMER in the last 72 hours. Hgb A1c No results for input(s): HGBA1C in the last 72 hours. Lipid Profile No results for input(s): CHOL, HDL, LDLCALC, TRIG, CHOLHDL, LDLDIRECT in the last 72 hours. Thyroid function studies No results for input(s): TSH, T4TOTAL, T3FREE, THYROIDAB in the last 72 hours.  Invalid input(s): FREET3 Anemia work up No results for input(s): VITAMINB12, FOLATE, FERRITIN, TIBC, IRON, RETICCTPCT in  the last 72 hours. Urinalysis    Component Value Date/Time   COLORURINE YELLOW 04/20/2018 0301   APPEARANCEUR HAZY (A) 04/20/2018 0301   LABSPEC 1.009 04/20/2018 0301   PHURINE 9.0 (H) 04/20/2018 0301   GLUCOSEU 150 (A) 04/20/2018 0301   HGBUR MODERATE (A) 04/20/2018 0301   BILIRUBINUR NEGATIVE 04/20/2018 0301   KETONESUR NEGATIVE 04/20/2018 0301   PROTEINUR 100 (A)  04/20/2018 0301   NITRITE NEGATIVE 04/20/2018 0301   LEUKOCYTESUR NEGATIVE 04/20/2018 0301   Sepsis Labs Invalid input(s): PROCALCITONIN,  WBC,  LACTICIDVEN Microbiology Recent Results (from the past 240 hour(s))  Culture, blood (routine x 2)     Status: None (Preliminary result)   Collection Time: 12/24/18  6:20 PM   Specimen: BLOOD  Result Value Ref Range Status   Specimen Description BLOOD LEFT ANTECUBITAL  Final   Special Requests   Final    BOTTLES DRAWN AEROBIC AND ANAEROBIC Blood Culture adequate volume   Culture   Final    NO GROWTH 3 DAYS Performed at [facility], [address], [place], [place] [address]    Report Status PENDING  Incomplete  SARS CORONAVIRUS 2 (TAT 6-24 HRS) Nasopharyngeal Nasopharyngeal Swab     Status: None   Collection Time: 12/24/18  6:20 PM   Specimen: Nasopharyngeal Swab  Result Value Ref Range Status   SARS Coronavirus 2 NEGATIVE NEGATIVE Final    Comment: (NOTE) SARS-CoV-2 target nucleic acids are NOT DETECTED. The SARS-CoV-2 RNA is generally detectable in upper and lower respiratory specimens during the acute phase of infection. Negative results do not preclude SARS-CoV-2 infection, do not rule out co-infections with other pathogens, and should not be used as the sole basis for treatment or other patient management decisions. Negative results must be combined with clinical observations, patient history, and epidemiological information. The expected result is Negative. Fact Sheet for Patients: SugarRoll.be Fact Sheet for Healthcare Providers: https://www.woods-mathews.com/ This test is not yet approved or cleared by the [place] FDA and  has been authorized for detection and/or diagnosis of SARS-CoV-2 by FDA under an Emergency Use Authorization (EUA). This EUA will remain  in effect (meaning this test can be used) for the duration of the COVID-19 declaration under Section 56 4(b)(1) of  the Act, 21 U.S.C. section 360bbb-3(b)(1), unless the authorization is terminated or revoked sooner. Performed at [facility], [place] [address], [place], [place] [address]   SARS Coronavirus 2 by RT PCR (hospital order, performed in [facility] hospital lab) Nasopharyngeal Nasopharyngeal Swab     Status: None   Collection Time: 12/24/18 10:22 PM   Specimen: Nasopharyngeal Swab  Result Value Ref Range Status   SARS Coronavirus 2 NEGATIVE NEGATIVE Final    Comment: (NOTE) SARS-CoV-2 target nucleic acids are NOT DETECTED. The SARS-CoV-2 RNA is generally detectable in upper and lower respiratory specimens during the acute phase of infection. The lowest concentration of SARS-CoV-2 viral copies this assay can detect is 250 copies / mL. A negative result does not preclude SARS-CoV-2 infection and should not be used as the sole basis for treatment or other patient management decisions.  A negative result may occur with improper specimen collection / handling, submission of specimen other than nasopharyngeal swab, presence of viral mutation(s) within the areas targeted by this assay, and inadequate number of viral copies (<250 copies / mL). A negative result must be combined with clinical observations, patient history, and epidemiological information. Fact Sheet for Patients:   StrictlyIdeas.no Fact Sheet for Healthcare  Providers: BankingDealers.co.za This test is not yet approved or cleared  by the [place] and has been authorized for detection and/or diagnosis of SARS-CoV-2 by FDA under an Emergency Use Authorization (EUA).  This EUA will remain in effect (meaning this test can be used) for the duration of the COVID-19 declaration under Section 564(b)(1) of the Act, 21 U.S.C. section 360bbb-3(b)(1), unless the authorization is terminated or revoked sooner. Performed at [facility], [place] [address], [place],  [place] [address]   MRSA PCR Screening     Status: None   Collection Time: 12/25/18 12:26 AM   Specimen: Nasopharyngeal  Result Value Ref Range Status   MRSA by PCR NEGATIVE NEGATIVE Final    Comment:        The GeneXpert MRSA Assay (FDA approved for NASAL specimens only), is one component of a comprehensive MRSA colonization surveillance program. It is not intended to diagnose MRSA infection nor to guide or monitor treatment for MRSA infections. Performed at [facility], [place] [address], [place], [place] [address]   Culture, blood (routine x 2)     Status: None (Preliminary result)   Collection Time: 12/25/18 12:45 AM   Specimen: BLOOD LEFT HAND  Result Value Ref Range Status   Specimen Description BLOOD LEFT HAND  Final   Special Requests   Final    BOTTLES DRAWN AEROBIC ONLY Blood Culture adequate volume   Culture   Final    NO GROWTH 2 DAYS Performed at [facility], [place] [address], [place], [place] [address]    Report Status PENDING  Incomplete     Time coordinating discharge: 35 minutes  SIGNED:   Jonnie Finner, DO  [facility] 12/27/2018, 3:14 PM Pager   If 7PM-7AM, please contact night-coverage [email or web address] Password [email or web address]

## 2018-12-27 NOTE — TOC Progression Note (Signed)
Transition of Care [facility]) - Progression Note    Patient Details  Name: Joan Mosley MRN: [id number] Date of Birth: 08-May-1945  Transition of Care [facility]) CM/SW Contact  Bartholomew Crews, RN Phone Number: [phone] 12/27/2018, 3:01 PM  Clinical Narrative:    Damaris Schooner with Tommi Rumps at [facility] concerning [place] needs - [facility] able to provide RN, PT, OT and ST services. Patient will need orders for [facility] services with Face to Face.   DME order for hoyer pad. Referral placed to [facility]. Unable to deliver to room, but could have shipped with delivery in 3-5 days vs family to pick up at [place] on [address] with [name] who verified that someone would be able to pick it up tomorrow, so patient would have in time for transport to outpatient dialysis on Thursday.   Patient to resume outpatient dialysis transport per [facility] at this time. Patient will need [facility] transport home.   Anticipate transition home today post dialysis. TOC following for transition needs.   Expected Discharge Plan: [place] Barriers to Discharge: Continued Medical Work up  Expected Discharge Plan and Services Expected Discharge Plan: [place] In-house Referral: Clinical Social Work Discharge Planning Services: CM Consult   Living arrangements for the past 2 months: Mobile Home                                       Social Determinants of Health (SDOH) Interventions    Readmission Risk Interventions Readmission Risk Prevention Plan 10/04/2018  Transportation Screening Complete  Medication Review Press photographer) Complete  PCP or Specialist appointment within 3-5 days of discharge Complete  HRI or [place] Complete  SW Recovery Care/Counseling Consult Complete  Palliative Care Screening Complete  [place] Patient Refused

## 2018-12-27 NOTE — Progress Notes (Signed)
  Speech Language Pathology Treatment: Dysphagia  Patient Details Name: Joan Mosley MRN: [id number] DOB: 1945/07/30 Today's Date: 12/27/2018 Time: [id number] SLP Time Calculation (min) (ACUTE ONLY): 15 min  Assessment / Plan / Recommendation Clinical Impression  Pt presented in bed eating with trunk lower in bed and listing to right. After repositioning,  observed with several bites of Dys 2 texture and thin tea. This therapist was delighted that she verbally recalled her chin tuck strategy without cueing and demonstrated use independently during brief observation. No s/s aspiration with solids or liquids although she declined to eat chicken at that time. Recommend she continue Dys 3, thin liquids using chin tuck and plan to see once more for longer session as able. DO not feel she needs ST follow up once discharged from hospital.   HPI HPI: Patient is a 73 year old female brought to the emergency room after hypotensive episode during dialysis. CT scan of the brain showed consistent with chronic microvascular disease.  CT scan of the abdomen pelvis showed diverticulosis without evidence of diverticulitis with some biliary tract dilation and choledocholithiasis but no clear evidence of cholecystitis.  Chest CT question of early right lower lobe infiltrate on CT scan of the chest. Additional PMH: DM, O2 dependent, TIA. BSE 10/20 with recommedation for reg/thin and MBS if aggressive measures desired- Palliative was following. Pt had pna during that admission.        SLP Plan  Continue with current plan of care       Recommendations  Diet recommendations: Dysphagia 3 (mechanical soft);Thin liquid Liquids provided via: Cup;No straw Medication Administration: Whole meds with puree Supervision: Full supervision/cueing for compensatory strategies;Patient able to self feed Compensations: Chin tuck;Slow rate;Small sips/bites;Minimize environmental distractions Postural Changes and/or Swallow  Maneuvers: Seated upright 90 degrees                Oral Care Recommendations: Oral care BID Follow up Recommendations: None SLP Visit Diagnosis: Dysphagia, pharyngeal phase (R13.13) Plan: Continue with current plan of care                       [name] 12/27/2018, 3:07 PM   [name] Colvin Caroli.Ed Risk analyst [phone] Office [phone]

## 2018-12-27 NOTE — Progress Notes (Signed)
Joan Mosley Progress Note   Subjective:  Seen in room - looks stable and BP holding. Denies CP, abd pain, fever, dyspnea. For HD today.   Objective Vitals:   12/26/18 1630 12/26/18 2042 12/27/18 0520 12/27/18 0833  BP: 138/79 130/72 103/69 109/67  Pulse: 86 (!) 102 87 85  Resp: 18 18 15 16   Temp: 99 F (37.2 C) 98 F (36.7 C) (!) 97.5 F (36.4 C) (!) 97.5 F (36.4 C)  TempSrc: Oral Oral Oral Oral  SpO2: 91% 90% 96% 94%  Weight:   93.9 kg   Height:       Physical Exam General: Overweight, NAD. Using nasal O2. Heart: RRR; no murmur Lungs: CTA anteriorly Abdomen:soft, non-tender Extremities: Trace BLE edema Dialysis Access: [facility]  Additional Objective Labs: Basic Metabolic Panel: Recent Labs  Lab 12/24/18 1654 12/24/18 1701 12/25/18 0045 12/27/18 0400  NA 135 132*  132* 136 132*  K 4.2 4.0  4.0 3.9 4.8  CL 92* 95* 93* 94*  CO2 20*  --  21* 18*  GLUCOSE 205* 198* 150* 92  BUN 11 13 18  49*  CREATININE 3.70* 3.40* 4.07* 6.82*  CALCIUM 8.2*  --  8.3* 8.7*  PHOS  --   --  6.0* 7.2*   Liver Function Tests: Recent Labs  Lab 12/24/18 1654 12/25/18 0045 12/27/18 0400  AST 28 27  --   ALT 19 18  --   ALKPHOS 111 104  --   BILITOT 1.0 0.7  --   PROT 7.2 6.4*  --   ALBUMIN 3.5 3.2* 2.6*   Recent Labs  Lab 12/24/18 1654  LIPASE 40   CBC: Recent Labs  Lab 12/24/18 1654  12/25/18 0045 12/26/18 0927 12/27/18 0400  WBC 19.3*  --  21.5* 9.2 10.4  NEUTROABS 15.6*  --   --  7.0 7.4  HGB 13.5   < > 12.2 10.3* 10.5*  HCT 44.9   < > 39.1 32.8* 33.4*  MCV 100.2*  --  97.3 95.3 96.3  PLT 161  --  139* 120* 109*   < > = values in this interval not displayed.   Blood Culture    Component Value Date/Time   SDES BLOOD LEFT HAND 12/25/2018 0045   SPECREQUEST  12/25/2018 0045    BOTTLES DRAWN AEROBIC ONLY Blood Culture adequate volume   CULT  12/25/2018 0045    NO GROWTH 2 DAYS Performed at [facility], [place] [address] [place],  [place] [address]    REPTSTATUS PENDING 12/25/2018 0045   Studies/Results: Dg Swallowing Func-speech Pathology  Result Date: 12/26/2018 Objective Swallowing Evaluation: Type of Study: MBS-Modified Barium Swallow Study  Patient Details Name: Joan Mosley MRN: [id number] Date of Birth: 04-05-1945 Today's Date: 12/26/2018 Time: SLP Start Time (ACUTE ONLY): 1140 -SLP Stop Time (ACUTE ONLY): 1200 SLP Time Calculation (min) (ACUTE ONLY): 20 min Past Medical History: Past Medical History: Diagnosis Date . Arthritis  . COPD (chronic obstructive pulmonary disease) ([place])  . Diabetes mellitus without complication ([place])  . Diabetic neuropathy ([place])  . Oxygen dependent 03/30/2018  5L home O2 . Renal disorder   ESRD . Thyroid disease  . TIA (transient ischemic attack)  Past Surgical History: Past Surgical History: Procedure Laterality Date . ABDOMINAL HYSTERECTOMY   . IR FLUORO GUIDE CV LINE RIGHT  11/14/2018 . RECTOCELE REPAIR   . REPLACEMENT TOTAL KNEE BILATERAL Bilateral 2008 . THYROIDECTOMY    80% . VAGINAL WOUND CLOSURE / REPAIR   HPI: Patient is  a 73 year old female brought to the emergency room after hypotensive episode during dialysis. CT scan of the brain showed consistent with chronic microvascular disease.  CT scan of the abdomen pelvis showed diverticulosis without evidence of diverticulitis with some biliary tract dilation and choledocholithiasis but no clear evidence of cholecystitis.  Chest CT question of early right lower lobe infiltrate on CT scan of the chest. Additional PMH: DM, O2 dependent, TIA. BSE 10/20 with recommedation for reg/thin and MBS if aggressive measures desired- Palliative was following. Pt had pna during that admission.   Subjective: Pt awake, alert, pleasant, participative Assessment / Plan / Recommendation CHL IP CLINICAL IMPRESSIONS 12/26/2018 Clinical Impression Pt exhibits mild pharyngeal dysphagia with silent penetration of thin liquids suspect as a result of COPD and swallow/respiratory  incoordination. Thin barium was penetrated to the level of the vocal cords as laryngeal closure delayed. Oral hold and chin tuck allowed for increased control of bolus and laryngeal closure preventing laryngeal compromise during study. From informal observation pt has significantly decreased working memory therefore will need full supervision to carry out chin tuck strategy with liquids. Sign posted on wall in pt's view as reminder. Recommend continue Dys 3, thin liquids, chin tuck with thin and pills whole in applesauce.     SLP Visit Diagnosis Dysphagia, pharyngeal phase (R13.13) Attention and concentration deficit following -- Frontal lobe and executive function deficit following -- Impact on safety and function Mild aspiration risk;Moderate aspiration risk   CHL IP TREATMENT RECOMMENDATION 12/26/2018 Treatment Recommendations Therapy as outlined in treatment plan below   Prognosis 12/26/2018 Prognosis for Safe Diet Advancement (No Data) Barriers to Reach Goals -- Barriers/Prognosis Comment -- CHL IP DIET RECOMMENDATION 12/26/2018 SLP Diet Recommendations Dysphagia 3 (Mech soft) solids;Thin liquid Liquid Administration via No straw;Cup Medication Administration Whole meds with puree Compensations Chin tuck;Slow rate;Small sips/bites;Minimize environmental distractions Postural Changes Seated upright at 90 degrees   CHL IP OTHER RECOMMENDATIONS 12/26/2018 Recommended Consults -- Oral Care Recommendations Oral care BID Other Recommendations --   CHL IP FOLLOW UP RECOMMENDATIONS 12/26/2018 Follow up Recommendations None   CHL IP FREQUENCY AND DURATION 12/26/2018 Speech Therapy Frequency (ACUTE ONLY) min 2x/week Treatment Duration 2 weeks      CHL IP ORAL PHASE 12/26/2018 Oral Phase WFL Oral - Pudding Teaspoon -- Oral - Pudding Cup -- Oral - Honey Teaspoon -- Oral - Honey Cup -- Oral - Nectar Teaspoon -- Oral - Nectar Cup -- Oral - Nectar Straw -- Oral - Thin Teaspoon -- Oral - Thin Cup -- Oral - Thin Straw -- Oral -  Puree -- Oral - Mech Soft -- Oral - Regular -- Oral - Multi-Consistency -- Oral - Pill -- Oral Phase - Comment --  CHL IP PHARYNGEAL PHASE 12/26/2018 Pharyngeal Phase Impaired Pharyngeal- Pudding Teaspoon -- Pharyngeal -- Pharyngeal- Pudding Cup -- Pharyngeal -- Pharyngeal- Honey Teaspoon -- Pharyngeal -- Pharyngeal- Honey Cup -- Pharyngeal -- Pharyngeal- Nectar Teaspoon -- Pharyngeal -- Pharyngeal- Nectar Cup -- Pharyngeal -- Pharyngeal- Nectar Straw -- Pharyngeal -- Pharyngeal- Thin Teaspoon -- Pharyngeal -- Pharyngeal- Thin Cup Pharyngeal residue - valleculae;Pharyngeal residue - pyriform;Penetration/Aspiration during swallow Pharyngeal Material enters airway, remains ABOVE vocal cords and not ejected out Pharyngeal- Thin Straw NT Pharyngeal -- Pharyngeal- Puree -- Pharyngeal -- Pharyngeal- Mechanical Soft -- Pharyngeal -- Pharyngeal- Regular WFL Pharyngeal -- Pharyngeal- Multi-consistency -- Pharyngeal -- Pharyngeal- Pill -- Pharyngeal -- Pharyngeal Comment --  CHL IP CERVICAL ESOPHAGEAL PHASE 12/26/2018 Cervical Esophageal Phase WFL Pudding Teaspoon -- Pudding Cup -- Honey Teaspoon -- Honey  Cup -- Surveyor, quantity Cup -- [employer] -- Thin Teaspoon -- Thin Cup -- Thin Straw -- Puree -- Mechanical Soft -- Regular -- Multi-consistency -- Pill -- Cervical Esophageal Comment -- [name] 12/26/2018, 2:42 PM              Medications: . sodium chloride    . aztreonam 0.5 g (12/26/18 2119)  . vancomycin     . calcitRIOL  1.75 mcg Oral Once per day on Tue Thu Sat  . Chlorhexidine Gluconate Cloth  6 each Topical Q0600  . cinacalcet  30 mg Oral Once per day on Tue Thu Sat  . docusate sodium  100 mg Oral q morning - 10a  . feeding supplement (PRO-STAT SUGAR FREE 64)  30 mL Oral BID  . FLUoxetine  10 mg Oral Daily  . heparin  5,000 Units Subcutaneous Q8H  . insulin aspart  0-5 Units Subcutaneous QHS  . insulin aspart  0-6 Units Subcutaneous TID WC  . levothyroxine  112 mcg Oral Q0600   . lidocaine  1 patch Transdermal Q24H  . montelukast  10 mg Oral QHS  . sevelamer carbonate  1,600 mg Oral TID WC    Dialysis Orders: TTS at [facility], 400/800, EDW 91kg (not reaching), 2K/2Ca, UFP 4, TDC, no heparin - Sensipar 30mg  PO q HD - Mircera 92mcg IV q 2 weeks (last 11/21) - Calcitriol 1.65mcg PO q HD  Assessment/Plan: 1.  ?Cardiac arrest v. syncopal episode d/t low BP: Quick ROSC 11/28 at HD.  2.  Leukocytosis: CT with possibility proctitis and/or RLL HCAP. BCx negative. On Vanc/Aztreonam. WBC much improved. 3.  Hypotension: Chronic issue - worse on admit, improving now. She has some volume on exam - hard to get off d/t BP issues - not reaching outpatient EDW, ?raise. Continue mido. 4.  ESRD:  Will continue HD per usual TTS schedule - next 12/1. No heparin. 5.  Anemia: Hgb 10.5 - stable today. FOBT pending. 6.  Metabolic bone disease: Ca ok, Phos high - continue Renvela as binder. 7.  Nutrition: Alb low - added pro-stat. 8.  T2DM 9.  Hypothyroidism 10.  DNR/DNI  Veneta Penton, PA-C 12/27/2018, 10:47 AM  [facility] Pager: [phone]

## 2018-12-27 NOTE — Progress Notes (Signed)
Joan Mosley  PROGRESS NOTE    Joan Mosley  [id number] DOB: [birth year] DOA: 12/24/2018 PCP: Flossie Buffy, NP   Brief Narrative:   The patient is a 73 year old woman who lives with her son andhas end-stage renal disease. She had dialysis yesterday. She was brought into the ED after having an episode of decreased responsiveness and concern for possible loss of pulses. She had brief CPR for about 30 seconds of chest compressions. She regained consciousness after that. She did not receive any medications. On arrival to ED she was found to have an elevated white count at 19,000, some possible diverticulosis without diverticulitis and possible proctitis. She was admitted to the [facility] ICU because she was on a low-dose of norepinephrine at 2 mcg, but she is chronically hypotensive on home midodrine. Patient is a DNR and DO NOT INTUBATE. She was started empirically on vancomycin and aztreonam blood cultures were sent. She is not hypoxemic, Covid test was negative.  11/30: Ok ON. Feeling better. Denies complaints 12/1: Looks good this AM. Stop abx. No infectious source identified. Home today if we can arrange equipment. Needs proper follow up with full PCP services at discharge.   Assessment & Plan:   Active Problems:   Hypotension  Circulatory shock/hypotension     - this is presumed secondary to HD session; however, infection has still not been ruled out     - remains on azactam and vanc     - she is afebrile, VS and WBC improved     - Bld Cx is NTD (2 days)     - continue abx through tomorrow; looking for Bld Cx neg x 3 days and then can discontinue     - 12/1: no infectious source identified; Bld Cx NTD x 3 days and CXR clear; d/c abx; she's ok for d/c to home; need full follow up with PCP  ESRD on HD     - per nephrology; appreciate assistance  DM2     - on levemir, humalog at home     - currently on SSI; continue     - glucose is ok today  Chronic respiratory failure  secondary to COPD     - continue PRN xoponex     - on home O2, 5L; continue  Dysphagia     - per SLP: Pt exhibits mild pharyngeal dysphagia with silent penetration of thin liquids suspect as a result of COPD and swallow/respiratory incoordination. Thin barium was penetrated to the level of the vocal cords as laryngeal closure delayed. Oral hold and chin tuck allowed for increased control of bolus and laryngeal closure preventing laryngeal compromise during study. From informal observation pt has significantly decreased working memory therefore will need full supervision to carry out chin tuck strategy with liquids. Sign posted on wall in pt's view as reminder. Recommend continue Dys 3, thin liquids, chin tuck with thin and pills whole in applesauce.     - let's see if we can get her some help at home with this/initial [facility] training with family; will speak with CM     - 12/1: HHPT/OT/SLP requested  Hypothyroidism     - synthroid  Anxiety/Depression     - continue prozac     - continue outpt follow up  DVT prophylaxis: heparin Code Status: DNR Family Communication: spoke with son by phone   Disposition Plan: TBD  Consultants:   PCCM   Subjective: "I think I've got it."  Objective: Vitals:   12/26/18 1630 12/26/18  2042 12/27/18 0520 12/27/18 0833  BP: 138/79 130/72 103/69 109/67  Pulse: 86 (!) 102 87 85  Resp: 18 18 15 16   Temp: 99 F (37.2 C) 98 F (36.7 C) (!) 97.5 F (36.4 C) (!) 97.5 F (36.4 C)  TempSrc: Oral Oral Oral Oral  SpO2: 91% 90% 96% 94%  Weight:   93.9 kg   Height:        Intake/Output Summary (Last 24 hours) at 12/27/2018 1508 Last data filed at 12/27/2018 0900 Gross per 24 hour  Intake 640 ml  Output 0 ml  Net 640 ml   Filed Weights   12/25/18 0026 12/26/18 0341 12/27/18 0520  Weight: 93 kg 92.5 kg 93.9 kg    Examination:  General: 73 y.o. female resting in bed in NAD Cardiovascular: RRR, +S1, S2, no m/g/r Respiratory: CTABL, no w/r/r GI:  BS+, NDNT, soft, obese MSK: No e/c/c Neuro: A&O x 3, no focal deficits Psyc: Appropriate interaction and affect, calm/cooperative   Data Reviewed: I have personally reviewed following labs and imaging studies.  CBC: Recent Labs  Lab 12/24/18 1654 12/24/18 1701 12/25/18 0045 12/26/18 0927 12/27/18 0400 12/27/18 1415  WBC 19.3*  --  21.5* 9.2 10.4 9.1  NEUTROABS 15.6*  --   --  7.0 7.4 6.2  HGB 13.5 15.0   15.6* 12.2 10.3* 10.5* 10.2*  HCT 44.9 44.0   46.0 39.1 32.8* 33.4* 32.2*  MCV 100.2*  --  97.3 95.3 96.3 95.5  PLT 161  --  139* 120* 109* [id number]*   Basic Metabolic Panel: Recent Labs  Lab 12/24/18 1654 12/24/18 1701 12/25/18 0045 12/26/18 0927 12/27/18 0400  NA 135 132*   132* 136  --  132*  K 4.2 4.0   4.0 3.9  --  4.8  CL 92* 95* 93*  --  94*  CO2 20*  --  21*  --  18*  GLUCOSE 205* 198* 150*  --  92  BUN 11 13 18   --  49*  CREATININE 3.70* 3.40* 4.07*  --  6.82*  CALCIUM 8.2*  --  8.3*  --  8.7*  MG  --   --  2.2 2.3 2.3  PHOS  --   --  6.0*  --  7.2*   GFR: Estimated Creatinine Clearance: 7.4 mL/min (A) (by C-G formula based on SCr of 6.82 mg/dL (H)). Liver Function Tests: Recent Labs  Lab 12/24/18 1654 12/25/18 0045 12/27/18 0400  AST 28 27  --   ALT 19 18  --   ALKPHOS 111 104  --   BILITOT 1.0 0.7  --   PROT 7.2 6.4*  --   ALBUMIN 3.5 3.2* 2.6*   Recent Labs  Lab 12/24/18 1654  LIPASE 40   No results for input(s): AMMONIA in the last 168 hours. Coagulation Profile: No results for input(s): INR, PROTIME in the last 168 hours. Cardiac Enzymes: No results for input(s): CKTOTAL, CKMB, CKMBINDEX, TROPONINI in the last 168 hours. BNP (last 3 results) No results for input(s): PROBNP in the last 8760 hours. HbA1C: No results for input(s): HGBA1C in the last 72 hours. CBG: Recent Labs  Lab 12/26/18 1159 12/26/18 1626 12/26/18 2044 12/27/18 0715 12/27/18 1126  GLUCAP 105* 94 125* 88 100*   Lipid Profile: No results for input(s): CHOL,  HDL, LDLCALC, TRIG, CHOLHDL, LDLDIRECT in the last 72 hours. Thyroid Function Tests: No results for input(s): TSH, T4TOTAL, FREET4, T3FREE, THYROIDAB in the last 72 hours. Anemia Panel: No results for  input(s): VITAMINB12, FOLATE, FERRITIN, TIBC, IRON, RETICCTPCT in the last 72 hours. Sepsis Labs: Recent Labs  Lab 12/24/18 2222 12/25/18 0045 12/25/18 0803  PROCALCITON 3.28  --   --   LATICACIDVEN 1.2 2.1* 0.6    Recent Results (from the past 240 hour(s))  Culture, blood (routine x 2)     Status: None (Preliminary result)   Collection Time: 12/24/18  6:20 PM   Specimen: BLOOD  Result Value Ref Range Status   Specimen Description BLOOD LEFT ANTECUBITAL  Final   Special Requests   Final    BOTTLES DRAWN AEROBIC AND ANAEROBIC Blood Culture adequate volume   Culture   Final    NO GROWTH 3 DAYS Performed at [facility], [place] [address] [place], [place] [address]    Report Status PENDING  Incomplete  SARS CORONAVIRUS 2 (TAT 6-24 HRS) Nasopharyngeal Nasopharyngeal Swab     Status: None   Collection Time: 12/24/18  6:20 PM   Specimen: Nasopharyngeal Swab  Result Value Ref Range Status   SARS Coronavirus 2 NEGATIVE NEGATIVE Final    Comment: (NOTE) SARS-CoV-2 target nucleic acids are NOT DETECTED. The SARS-CoV-2 RNA is generally detectable in upper and lower respiratory specimens during the acute phase of infection. Negative results do not preclude SARS-CoV-2 infection, do not rule out co-infections with other pathogens, and should not be used as the sole basis for treatment or other patient management decisions. Negative results must be combined with clinical observations, patient history, and epidemiological information. The expected result is Negative. Fact Sheet for Patients: SugarRoll.be Fact Sheet for Healthcare Providers: https://www.woods-mathews.com/ This test is not yet approved or cleared by the [place] FDA and  has  been authorized for detection and/or diagnosis of SARS-CoV-2 by FDA under an Emergency Use Authorization (EUA). This EUA will remain  in effect (meaning this test can be used) for the duration of the COVID-19 declaration under Section 56 4(b)(1) of the Act, 21 U.S.C. section 360bbb-3(b)(1), unless the authorization is terminated or revoked sooner. Performed at [facility], [place] [address] [place], [place] [address]   SARS Coronavirus 2 by RT PCR (hospital order, performed in [facility] hospital lab) Nasopharyngeal Nasopharyngeal Swab     Status: None   Collection Time: 12/24/18 10:22 PM   Specimen: Nasopharyngeal Swab  Result Value Ref Range Status   SARS Coronavirus 2 NEGATIVE NEGATIVE Final    Comment: (NOTE) SARS-CoV-2 target nucleic acids are NOT DETECTED. The SARS-CoV-2 RNA is generally detectable in upper and lower respiratory specimens during the acute phase of infection. The lowest concentration of SARS-CoV-2 viral copies this assay can detect is 250 copies / mL. A negative result does not preclude SARS-CoV-2 infection and should not be used as the sole basis for treatment or other patient management decisions.  A negative result may occur with improper specimen collection / handling, submission of specimen other than nasopharyngeal swab, presence of viral mutation(s) within the areas targeted by this assay, and inadequate number of viral copies (<250 copies / mL). A negative result must be combined with clinical observations, patient history, and epidemiological information. Fact Sheet for Patients:   StrictlyIdeas.no Fact Sheet for Healthcare Providers: BankingDealers.co.za This test is not yet approved or cleared  by the [place] FDA and has been authorized for detection and/or diagnosis of SARS-CoV-2 by FDA under an Emergency Use Authorization (EUA).  This EUA will remain in effect (meaning this test can be  used) for the duration of the COVID-19  declaration under Section 564(b)(1) of the Act, 21 U.S.C. section 360bbb-3(b)(1), unless the authorization is terminated or revoked sooner. Performed at [facility], [place] [address], [place], [place] [address]   MRSA PCR Screening     Status: None   Collection Time: 12/25/18 12:26 AM   Specimen: Nasopharyngeal  Result Value Ref Range Status   MRSA by PCR NEGATIVE NEGATIVE Final    Comment:        The GeneXpert MRSA Assay (FDA approved for NASAL specimens only), is one component of a comprehensive MRSA colonization surveillance program. It is not intended to diagnose MRSA infection nor to guide or monitor treatment for MRSA infections. Performed at [facility], [place] [address] [place], [place] [address]   Culture, blood (routine x 2)     Status: None (Preliminary result)   Collection Time: 12/25/18 12:45 AM   Specimen: BLOOD LEFT HAND  Result Value Ref Range Status   Specimen Description BLOOD LEFT HAND  Final   Special Requests   Final    BOTTLES DRAWN AEROBIC ONLY Blood Culture adequate volume   Culture   Final    NO GROWTH 2 DAYS Performed at [facility], [place] [address], [place], [place] [address]    Report Status PENDING  Incomplete      Radiology Studies: Dg Swallowing Func-speech Pathology  Result Date: 12/26/2018 Objective Swallowing Evaluation: Type of Study: MBS-Modified Barium Swallow Study  Patient Details Name: Letecia Tolento MRN: [id number] Date of Birth: Jun 06, 1945 Today's Date: 12/26/2018 Time: SLP Start Time (ACUTE ONLY): 1140 -SLP Stop Time (ACUTE ONLY): 1200 SLP Time Calculation (min) (ACUTE ONLY): 20 min Past Medical History: Past Medical History: Diagnosis Date  Arthritis   COPD (chronic obstructive pulmonary disease) ([place])   Diabetes mellitus without complication ([place])   Diabetic neuropathy ([place])   Oxygen dependent 03/30/2018  5L home O2  Renal disorder   ESRD  Thyroid disease   TIA  (transient ischemic attack)  Past Surgical History: Past Surgical History: Procedure Laterality Date  ABDOMINAL HYSTERECTOMY    IR FLUORO GUIDE CV LINE RIGHT  11/14/2018  RECTOCELE REPAIR    REPLACEMENT TOTAL KNEE BILATERAL Bilateral 2008  THYROIDECTOMY    80%  VAGINAL WOUND CLOSURE / REPAIR   HPI: Patient is a 73 year old female brought to the emergency room after hypotensive episode during dialysis. CT scan of the brain showed consistent with chronic microvascular disease.  CT scan of the abdomen pelvis showed diverticulosis without evidence of diverticulitis with some biliary tract dilation and choledocholithiasis but no clear evidence of cholecystitis.  Chest CT question of early right lower lobe infiltrate on CT scan of the chest. Additional PMH: DM, O2 dependent, TIA. BSE 10/20 with recommedation for reg/thin and MBS if aggressive measures desired- Palliative was following. Pt had pna during that admission.   Subjective: Pt awake, alert, pleasant, participative Assessment / Plan / Recommendation CHL IP CLINICAL IMPRESSIONS 12/26/2018 Clinical Impression Pt exhibits mild pharyngeal dysphagia with silent penetration of thin liquids suspect as a result of COPD and swallow/respiratory incoordination. Thin barium was penetrated to the level of the vocal cords as laryngeal closure delayed. Oral hold and chin tuck allowed for increased control of bolus and laryngeal closure preventing laryngeal compromise during study. From informal observation pt has significantly decreased working memory therefore will need full supervision to carry out chin tuck strategy with liquids. Sign posted on wall in pt's view as reminder. Recommend continue Dys 3, thin liquids, chin tuck with  thin and pills whole in applesauce.     SLP Visit Diagnosis Dysphagia, pharyngeal phase (R13.13) Attention and concentration deficit following -- Frontal lobe and executive function deficit following -- Impact on safety and function Mild  aspiration risk;Moderate aspiration risk   CHL IP TREATMENT RECOMMENDATION 12/26/2018 Treatment Recommendations Therapy as outlined in treatment plan below   Prognosis 12/26/2018 Prognosis for Safe Diet Advancement (No Data) Barriers to Reach Goals -- Barriers/Prognosis Comment -- CHL IP DIET RECOMMENDATION 12/26/2018 SLP Diet Recommendations Dysphagia 3 (Mech soft) solids;Thin liquid Liquid Administration via No straw;Cup Medication Administration Whole meds with puree Compensations Chin tuck;Slow rate;Small sips/bites;Minimize environmental distractions Postural Changes Seated upright at 90 degrees   CHL IP OTHER RECOMMENDATIONS 12/26/2018 Recommended Consults -- Oral Care Recommendations Oral care BID Other Recommendations --   CHL IP FOLLOW UP RECOMMENDATIONS 12/26/2018 Follow up Recommendations None   CHL IP FREQUENCY AND DURATION 12/26/2018 Speech Therapy Frequency (ACUTE ONLY) min 2x/week Treatment Duration 2 weeks      CHL IP ORAL PHASE 12/26/2018 Oral Phase WFL Oral - Pudding Teaspoon -- Oral - Pudding Cup -- Oral - Honey Teaspoon -- Oral - Honey Cup -- Oral - Nectar Teaspoon -- Oral - Nectar Cup -- Oral - Nectar Straw -- Oral - Thin Teaspoon -- Oral - Thin Cup -- Oral - Thin Straw -- Oral - Puree -- Oral - Mech Soft -- Oral - Regular -- Oral - Multi-Consistency -- Oral - Pill -- Oral Phase - Comment --  CHL IP PHARYNGEAL PHASE 12/26/2018 Pharyngeal Phase Impaired Pharyngeal- Pudding Teaspoon -- Pharyngeal -- Pharyngeal- Pudding Cup -- Pharyngeal -- Pharyngeal- Honey Teaspoon -- Pharyngeal -- Pharyngeal- Honey Cup -- Pharyngeal -- Pharyngeal- Nectar Teaspoon -- Pharyngeal -- Pharyngeal- Nectar Cup -- Pharyngeal -- Pharyngeal- Nectar Straw -- Pharyngeal -- Pharyngeal- Thin Teaspoon -- Pharyngeal -- Pharyngeal- Thin Cup Pharyngeal residue - valleculae;Pharyngeal residue - pyriform;Penetration/Aspiration during swallow Pharyngeal Material enters airway, remains ABOVE vocal cords and not ejected out Pharyngeal-  Thin Straw NT Pharyngeal -- Pharyngeal- Puree -- Pharyngeal -- Pharyngeal- Mechanical Soft -- Pharyngeal -- Pharyngeal- Regular WFL Pharyngeal -- Pharyngeal- Multi-consistency -- Pharyngeal -- Pharyngeal- Pill -- Pharyngeal -- Pharyngeal Comment --  CHL IP CERVICAL ESOPHAGEAL PHASE 12/26/2018 Cervical Esophageal Phase WFL Pudding Teaspoon -- Pudding Cup -- Honey Teaspoon -- Honey Cup -- Nectar Teaspoon -- Nectar Cup -- Nectar Straw -- Thin Teaspoon -- Thin Cup -- Thin Straw -- Puree -- Mechanical Soft -- Regular -- Multi-consistency -- Pill -- Cervical Esophageal Comment -- [name] 12/26/2018, 2:42 PM                Scheduled Meds:  calcitRIOL  1.75 mcg Oral Once per day on Tue Thu Sat   Chlorhexidine Gluconate Cloth  6 each Topical Q0600   cinacalcet  30 mg Oral Once per day on Tue Thu Sat   docusate sodium  100 mg Oral q morning - 10a   feeding supplement (PRO-STAT SUGAR FREE 64)  30 mL Oral BID   FLUoxetine  10 mg Oral Daily   heparin  5,000 Units Subcutaneous Q8H   insulin aspart  0-5 Units Subcutaneous QHS   insulin aspart  0-6 Units Subcutaneous TID WC   levothyroxine  112 mcg Oral Q0600   lidocaine  1 patch Transdermal Q24H   montelukast  10 mg Oral QHS   sevelamer carbonate  1,600 mg Oral TID WC   Continuous Infusions:  sodium chloride     sodium chloride     sodium  chloride       LOS: 3 days    Time spent: 35 minutes spent in the coordination of care today.    Jonnie Finner, DO [facility] Pager [phone]  If 7PM-7AM, please contact night-coverage [email or web address] Password [email or web address] 12/27/2018, 3:08 PM

## 2018-12-27 NOTE — Progress Notes (Signed)
Pharmacy Antibiotic Note  Joan Mosley is a 73 y.o. female admitted on 12/24/2018 with concern for PNA/sepsis.  Pharmacy has been consulted for Vancomycin and Azactam dosing.  The patient is ESRD-TTS with plans to dialyze today, 12/1, on schedule. Antibiotic dosing remains appropriate.   Given MRSA PCR neg and BCx ngtd thus far - could consider d/cing Vancomycin.   Plan: - Continue Vancomycin 1g/HD-TTS - Continue Azactam 500 mg IV q12h - Will continue to follow HD schedule/duration, culture results, LOT, and antibiotic de-escalation plans   Height: 5\' 1"  (154.9 cm) Weight: 207 lb 0.2 oz (93.9 kg) IBW/kg (Calculated) : 47.8  Temp (24hrs), Avg:98 F (36.7 C), Min:97.5 F (36.4 C), Max:99 F (37.2 C)  Recent Labs  Lab 12/24/18 1654 12/24/18 1701 12/24/18 1745 12/24/18 2222 12/25/18 0045 12/25/18 0803 12/26/18 0927 12/27/18 0400  WBC 19.3*  --   --   --  21.5*  --  9.2 10.4  CREATININE 3.70* 3.40*  --   --  4.07*  --   --  6.82*  LATICACIDVEN  --   --   --  1.2 2.1* 0.6  --   --   VANCORANDOM  --   --  <4  --   --   --   --   --     Estimated Creatinine Clearance: 7.4 mL/min (A) (by C-G formula based on SCr of 6.82 mg/dL (H)).    Allergies  Allergen Reactions  . Avelox [Moxifloxacin Hcl In Nacl] Other (See Comments)    Unknown reaction  . Codeine Anaphylaxis  . Penicillins Rash    Did it involve swelling of the face/tongue/throat, SOB, or low BP? Unk Did it involve sudden or severe rash/hives, skin peeling, or any reaction on the inside of your mouth or nose? Yes Did you need to seek medical attention at a hospital or doctor's office? Unk When did it last happen? Unk If all above answers are "NO", may proceed with cephalosporin use.   . Sulfa Antibiotics Other (See Comments)    Unknown reaction  . Dextromethorphan-Guaifenesin Other (See Comments)    Reported by [facility] - unknown reaction  . Shellfish Allergy Hives, Itching and Rash    Antimicrobials this  admission: Vanc 11/28 >> Azactam 11/28 >>  Dose adjustments this admission: n.a  Microbiology results: 11/28 COVID >> neg 11/28 BCx >> ngtd 11/29 MRSA PCR >> neg  Thank you for allowing pharmacy to be a part of this patient's care.  Alycia Rossetti, PharmD, BCPS Clinical Pharmacist Clinical phone for 12/27/2018: [phone] 12/27/2018 8:31 AM   **Pharmacist phone directory can now be found on [email or web address] (PW [email or web address]).  Listed under [place].

## 2018-12-28 ENCOUNTER — Telehealth: Payer: Self-pay | Admitting: *Deleted

## 2018-12-28 ENCOUNTER — Telehealth: Payer: Self-pay | Admitting: Nurse Practitioner

## 2018-12-28 ENCOUNTER — Telehealth: Payer: Self-pay | Admitting: Nephrology

## 2018-12-28 ENCOUNTER — Other Ambulatory Visit: Payer: Self-pay | Admitting: Nurse Practitioner

## 2018-12-28 DIAGNOSIS — F419 Anxiety disorder, unspecified: Secondary | ICD-10-CM

## 2018-12-28 LAB — HEPATITIS B SURFACE ANTIBODY, QUANTITATIVE: Hep B S AB Quant (Post): 6.1 m[IU]/mL — ABNORMAL LOW (ref >9.9)

## 2018-12-28 LAB — HEPATITIS B E ANTIGEN: Hep B E Ag: NEGATIVE

## 2018-12-28 MED ORDER — CLONAZEPAM 0.5 MG PO TABS: 0.5000 mg | ORAL_TABLET | Freq: Two times a day (BID) | ORAL | 0 refills | Status: DC | PRN

## 2018-12-28 NOTE — Telephone Encounter (Signed)
LVM for pt to call office

## 2018-12-28 NOTE — Telephone Encounter (Signed)
Please contact pharmacy to merge PMP account. Inform Mark about previous phone note on filling controlled substance. I sent klonopin at decreased dose 0.5mg  BID prn due to chronic respiratory failure and hypoxia. The goal is not use the minimum dose without causing respiratory suppression.  * LVM for the pt to call back, need to inform Baldo Ash message above and remind the pt about not to use more than one pharmacy and not to get control med from another provider beside [place].   Pt needs to schedule Hospital f/u with PCP

## 2018-12-28 NOTE — Telephone Encounter (Signed)
Paper work from [place] need to fill out to get [place]. [name] stated that the pt needs 24 hours care/assistant due to wheelchair bound,unable to perform any ADL's and her health condition.   Please call [name] back once this paper is done.    Copied from [place] [phone]. Topic: General - Inquiry >> Dec 28, 2018  3:29 PM Virl Axe D wrote: Reason for CRM: Pt's son stated he is faxing paperwork for pt that goes to the [place]. Requesting a callback to the number on the paperwork/application prior to faxing it back so he can turn on the fax machine. Please advise.

## 2018-12-28 NOTE — Telephone Encounter (Signed)
Transition of care contact from inpatient facility  Date of discharge: 12/27/18  Date of contact: 12/28/18 Method: Phone Spoke to: Patient  Patient contacted to discuss transition of care from recent inpatient hospitalization. Patient was admitted to [facility] from 11/28 to 12/27/18 with discharge diagnosis of hypotension, circulatory shock.  Says she is feeling well - no CP or dizziness. Still does not have the hoyer lift pad for dialysis (ongoing) issue. Reminded her to take her midodrine prior to HD - she knows this.  Medication changes were reviewed.  Patient will follow up with his/her outpatient HD unit on: Thur 12/3.  Other f/u needs include: none at this time.  Veneta Penton, PA-C [employer] Pager [phone]

## 2018-12-28 NOTE — Progress Notes (Signed)
Please contact pharmacy to merge PMP account. Inform Joan Mosley about previous phone note on filling controlled substance. I sent klonopin at decreased dose 0.5mg  BID prn due to chronic respiratory failure and hypoxia. The goal is not use the minimum dose without causing respiratory suppression.

## 2018-12-29 LAB — CULTURE, BLOOD (ROUTINE X 2)
Culture: NO GROWTH
Special Requests: ADEQUATE

## 2018-12-29 NOTE — Telephone Encounter (Signed)
Spoke with Joan Mosley, she is aware of med change and remind her about controlled substance rules. She verbalized understand.

## 2018-12-29 NOTE — Telephone Encounter (Signed)
LVM for pt to call office. 2nd attempt.

## 2018-12-30 ENCOUNTER — Telehealth: Payer: Self-pay | Admitting: Nurse Practitioner

## 2018-12-30 LAB — CULTURE, BLOOD (ROUTINE X 2)
Culture: NO GROWTH
Special Requests: ADEQUATE

## 2018-12-30 NOTE — Telephone Encounter (Signed)
Unable to reach pt. 3rd attempt.

## 2018-12-30 NOTE — Telephone Encounter (Signed)
Please advise 

## 2018-12-30 NOTE — Telephone Encounter (Signed)
Joan Mosley calling with Joan Mosley called to let us know that pt refused therapy today so they would like to know if they can go back out 01/02/19. Please advise

## 2019-01-01 NOTE — Telephone Encounter (Signed)
yes

## 2019-01-02 DIAGNOSIS — Z0279 Encounter for issue of other medical certificate: Secondary | ICD-10-CM

## 2019-01-02 NOTE — Telephone Encounter (Signed)
Done. Form send to be scan and mail the pt's son as requested.

## 2019-01-02 NOTE — Telephone Encounter (Signed)
Looks like this may be the third time she refused PT and OT. If this is the case, they can cancel services. Ms. Kilcullen need to schedule transition of care appt with me.

## 2019-01-02 NOTE — Telephone Encounter (Signed)
[name] returned call and he would like form mailed to: Joan Mosley [address] [place], [place] [address]

## 2019-01-02 NOTE — Telephone Encounter (Signed)
LVM for Joan Mosley to call back--[phone]. Form is completed, need to know if he wants me to fax it (fax #?) or mail it?

## 2019-01-02 NOTE — Telephone Encounter (Signed)
Sree stated that Mrs. Joan Mosley refuse service today as well due to appt she has to day and dialysis tomorrow.   FYI--[name]

## 2019-01-02 NOTE — Telephone Encounter (Signed)
Pt has an appt with you 01/14/2019.

## 2019-01-03 ENCOUNTER — Ambulatory Visit (INDEPENDENT_AMBULATORY_CARE_PROVIDER_SITE_OTHER): Payer: Medicare Other | Admitting: Podiatry

## 2019-01-03 DIAGNOSIS — Z5329 Procedure and treatment not carried out because of patient's decision for other reasons: Secondary | ICD-10-CM

## 2019-01-03 NOTE — Progress Notes (Signed)
No show for appt. 

## 2019-01-04 ENCOUNTER — Telehealth: Payer: Self-pay | Admitting: Nurse Practitioner

## 2019-01-04 NOTE — Telephone Encounter (Signed)
Macie Burows--   Copied from [place] [id number]. Topic: General - Other >> Jan 04, 2019 12:36 PM Keene Breath wrote: Reason for CRM: Called to inform the nurse that he has been trying to contact the patient for over a week and finally today the patient stated that she does not want any therapy.  Please advise and call to discuss at [phone]

## 2019-01-05 ENCOUNTER — Emergency Department (HOSPITAL_COMMUNITY)
Admission: EM | Admit: 2019-01-05 | Discharge: 2019-01-05 | Disposition: A | Discharge status: Home / Self Care | Payer: Medicare Other | Attending: Emergency Medicine | Admitting: Emergency Medicine

## 2019-01-05 ENCOUNTER — Emergency Department (HOSPITAL_COMMUNITY): Payer: Medicare Other

## 2019-01-05 DIAGNOSIS — Z992 Dependence on renal dialysis: Secondary | ICD-10-CM | POA: Insufficient documentation

## 2019-01-05 DIAGNOSIS — E1122 Type 2 diabetes mellitus with diabetic chronic kidney disease: Secondary | ICD-10-CM | POA: Diagnosis not present

## 2019-01-05 DIAGNOSIS — R55 Syncope and collapse: Secondary | ICD-10-CM | POA: Insufficient documentation

## 2019-01-05 DIAGNOSIS — J449 Chronic obstructive pulmonary disease, unspecified: Secondary | ICD-10-CM | POA: Insufficient documentation

## 2019-01-05 DIAGNOSIS — E079 Disorder of thyroid, unspecified: Secondary | ICD-10-CM | POA: Diagnosis not present

## 2019-01-05 DIAGNOSIS — N186 End stage renal disease: Secondary | ICD-10-CM | POA: Diagnosis not present

## 2019-01-05 LAB — CBC
HCT: 40.9 % (ref 36.0–46.0)
Hemoglobin: 12.6 g/dL (ref 12.0–15.0)
MCH: 30.1 pg (ref 26.0–34.0)
MCHC: 30.8 g/dL (ref 30.0–36.0)
MCV: 97.6 fL (ref 80.0–100.0)
Platelets: 226 10*3/uL (ref 150–400)
RBC: 4.19 MIL/uL (ref 3.87–5.11)
RDW: 16 % — ABNORMAL HIGH (ref 11.5–15.5)
WBC: 13.1 10*3/uL — ABNORMAL HIGH (ref 4.0–10.5)
nRBC: 0 % (ref 0.0–0.2)

## 2019-01-05 LAB — COMPREHENSIVE METABOLIC PANEL
ALT: 15 U/L (ref 0–44)
AST: 23 U/L (ref 15–41)
Albumin: 3.2 g/dL — ABNORMAL LOW (ref 3.5–5.0)
Alkaline Phosphatase: 109 U/L (ref 38–126)
Anion gap: 15 (ref 5–15)
BUN: 11 mg/dL (ref 8–23)
CO2: 25 mmol/L (ref 22–32)
Calcium: 8.7 mg/dL — ABNORMAL LOW (ref 8.9–10.3)
Chloride: 95 mmol/L — ABNORMAL LOW (ref 98–111)
Creatinine, Ser: 3.64 mg/dL — ABNORMAL HIGH (ref 0.44–1.00)
GFR calc Af Amer: 13 mL/min — ABNORMAL LOW (ref >60)
GFR calc non Af Amer: 12 mL/min — ABNORMAL LOW (ref >60)
Glucose, Bld: 132 mg/dL — ABNORMAL HIGH (ref 70–99)
Potassium: 3.7 mmol/L (ref 3.5–5.1)
Sodium: 135 mmol/L (ref 135–145)
Total Bilirubin: 1 mg/dL (ref 0.3–1.2)
Total Protein: 6.7 g/dL (ref 6.5–8.1)

## 2019-01-05 NOTE — Discharge Instructions (Addendum)
You were evaluated in the Emergency Department and after careful evaluation, we did not find any emergent condition requiring admission or further testing in the hospital.  Your exam/testing today was overall reassuring.  Your symptoms seem to be related to blood pressure changes in the setting of dialysis.  Please return to the Emergency Department if you experience any worsening of your condition.  We encourage you to follow up with a primary care provider.  Thank you for allowing [name] to be a part of your care.

## 2019-01-05 NOTE — ED Triage Notes (Signed)
Per EMS: pt from dialysis center with a syncopal episode reported by staff after a partial treatment. Upon EMS arrival at dialysis center, pt A/O x4 and lethargic.  Pt reports having PNA a few ago.  Denies any pain or SHOB. Pt is on 5L oxygen chronically at home.    EMS vitals: BP 120/84 HR 90 CBG 92

## 2019-01-05 NOTE — ED Notes (Signed)
ED Provider at bedside. 

## 2019-01-05 NOTE — ED Notes (Signed)
PTAR called to transport patient home 

## 2019-01-05 NOTE — ED Provider Notes (Signed)
[facility] Emergency Department Provider Note MRN:  [id number]  Arrival date & time: 01/05/19     Chief Complaint   Syncope History of Present Illness   Joan Mosley is a 73 y.o. year-old female with a history of diabetes, ESRD, COPD, chronic respiratory failure presenting to the ED with chief complaint of syncope.  Patient is here via EMS after syncopal episode during dialysis.  Was scheduled to have 4.5 L removed.  After 2.5 L of removed fluid, she began complaining of malaise and fatigue.  She was reportedly unresponsive with faint pulses.  On EMS arrival she was awake and able to answer questions.  Currently without pain, still feels weak.  Symptoms moderate, no exacerbating or alleviating factors.  Review of Systems  A complete 10 system review of systems was obtained and all systems are negative except as noted in the HPI and PMH.   Patient's Health History    Past Medical History:  Diagnosis Date  . Arthritis   . COPD (chronic obstructive pulmonary disease) ([place])   . Diabetes mellitus without complication ([place])   . Diabetic neuropathy ([place])   . 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 Renal disorder    ESRD  . Thyroid disease   . TIA (transient ischemic attack)     Past Surgical History:  Procedure Laterality Date  . ABDOMINAL HYSTERECTOMY    . IR FLUORO GUIDE CV LINE RIGHT  11/14/2018  . RECTOCELE REPAIR    . REPLACEMENT TOTAL KNEE BILATERAL Bilateral 2008  . THYROIDECTOMY     80%  . VAGINAL WOUND CLOSURE / REPAIR      Family History  Problem Relation Age of Onset  . Heart disease Mother   . Hypertension Mother   . Heart disease Father   . Hypertension Father     Social History   Socioeconomic History  . Marital status: Widowed    Spouse name: Not on file  . Number of children: 5  . Years of education: Not on file  . Highest education level: Not on file  Occupational History    Comment: disabled  Tobacco Use  . Smoking  status: Former Smoker    Packs/day: 1.00    Years: 35.00    Pack years: 35.00    Quit date: 03/29/1981    Years since quitting: 37.7  . Smokeless tobacco: Never Used  Substance and Sexual Activity  . Alcohol use: Never  . Drug use: Never  . Sexual activity: Not Currently  Other Topics Concern  . Not on file  Social History Narrative  . Not on file   Social Determinants of Health   Financial Resource Strain:   . Difficulty of Paying Living Expenses: Not on file  Food Insecurity:   . Worried About Charity fundraiser in the Last Year: Not on file  . Ran Out of Food in the Last Year: Not on file  Transportation Needs:   . Lack of Transportation (Medical): Not on file  . Lack of Transportation (Non-Medical): Not on file  Physical Activity:   . Days of Exercise per Week: Not on file  . Minutes of Exercise per Session: Not on file  Stress:   . Feeling of Stress : Not on file  Social Connections:   . Frequency of Communication with Friends and Family: Not on file  . Frequency of Social Gatherings with Friends and Family: Not on file  . Attends Religious Services: Not on file  .  Active Member of Clubs or Organizations: Not on file  . Attends Archivist Meetings: Not on file  . Marital Status: Not on file  Intimate Partner Violence:   . Fear of Current or Ex-Partner: Not on file  . Emotionally Abused: Not on file  . Physically Abused: Not on file  . Sexually Abused: Not on file     Physical Exam  Vital Signs and Nursing Notes reviewed Vitals:   01/05/19 1915 01/05/19 1930  BP: 108/89 109/81  Pulse: 96 95  Resp: 20 18  Temp:    SpO2: 98% 98%    CONSTITUTIONAL: Chronically ill-appearing, NAD NEURO:  Alert and oriented x 3, no focal deficits EYES:  eyes equal and reactive ENT/NECK:  no LAD, no JVD CARDIO: Regular rate, well-perfused, normal S1 and S2 PULM:  CTAB no wheezing or rhonchi GI/GU:  normal bowel sounds, non-distended, non-tender MSK/SPINE:  No gross  deformities, no edema SKIN:  no rash, atraumatic PSYCH:  Appropriate speech and behavior  Diagnostic and Interventional Summary    EKG Interpretation  Date/Time:  Thursday January 05 2019 17:06:38 EST Ventricular Rate:  101 PR Interval:    QRS Duration: 96 QT Interval:  349 QTC Calculation: 453 R Axis:   -19 Text Interpretation: Sinus or ectopic atrial tachycardia Probable left atrial enlargement LVH with secondary repolarization abnormality No significant change was found Confirmed by Gerlene Fee [phone]) on 01/05/2019 5:12:21 PM      Labs Reviewed  CBC - Abnormal; Notable for the following components:      Result Value   WBC 13.1 (*)    RDW 16.0 (*)    All other components within normal limits  COMPREHENSIVE METABOLIC PANEL - Abnormal; Notable for the following components:   Chloride 95 (*)    Glucose, Bld 132 (*)    Creatinine, Ser 3.64 (*)    Calcium 8.7 (*)    Albumin 3.2 (*)    GFR calc non Af Amer 12 (*)    GFR calc Af Amer 13 (*)    All other components within normal limits    XR Chest Single View  Final Result      Medications - No data to display   Procedures  /  Critical Care Procedures  ED Course and Medical Decision Making  I have reviewed the triage vital signs and the nursing notes.  Pertinent labs & imaging results that were available during my care of the patient were reviewed by me and considered in my medical decision making (see below for details).     Suspect syncope in the setting of fluid/electrolyte shifts during dialysis such as disequilibrium syndrome.  Patient is without chest pain or shortness of breath, chronic edema but no new leg pain or swelling to suggest DVT, soft abdomen, will monitor for a few hours to screen for cardiac arrhythmia, otherwise she is a candidate for discharge.  Patient was observed for 4 hours in the emergency department without arrhythmia.  Work-up is unrevealing, normal vital signs, normal laboratory  assessment.  She is not fluid overloaded, potassium is reassuring, BUN reassuring.  She is appropriate for discharge with close PCP follow-up and strict return precautions.  Barth Kirks. Sedonia Small, [place] [email or web address]  Final Clinical Impressions(s) / ED Diagnoses     ICD-10-CM   1. Syncope  R55 XR Chest Single View    XR Chest Single View    ED Discharge Orders    None  Discharge Instructions Discussed with and Provided to Patient:     Discharge Instructions     You were evaluated in the Emergency Department and after careful evaluation, we did not find any emergent condition requiring admission or further testing in the hospital.  Your exam/testing today was overall reassuring.  Your symptoms seem to be related to blood pressure changes in the setting of dialysis.  Please return to the Emergency Department if you experience any worsening of your condition.  We encourage you to follow up with a primary care provider.  Thank you for allowing [name] to be a part of your care.        Maudie Flakes, MD 01/05/19 2051

## 2019-01-05 NOTE — ED Notes (Signed)
Pt will transport via ptar per pt son d/t oxygen needs and immobility, will notify ptar

## 2019-01-10 ENCOUNTER — Other Ambulatory Visit: Payer: Self-pay

## 2019-01-11 ENCOUNTER — Ambulatory Visit: Payer: Medicare Other | Admitting: Nurse Practitioner

## 2019-02-07 ENCOUNTER — Emergency Department (HOSPITAL_COMMUNITY): Payer: Medicare Other

## 2019-02-07 ENCOUNTER — Emergency Department (HOSPITAL_COMMUNITY)
Admission: EM | Admit: 2019-02-07 | Discharge: 2019-02-08 | Disposition: A | Discharge status: Home / Self Care | Payer: Medicare Other | Source: Home / Self Care | Attending: Emergency Medicine | Admitting: Emergency Medicine

## 2019-02-07 ENCOUNTER — Other Ambulatory Visit: Payer: Self-pay

## 2019-02-07 DIAGNOSIS — Z992 Dependence on renal dialysis: Secondary | ICD-10-CM | POA: Insufficient documentation

## 2019-02-07 DIAGNOSIS — N186 End stage renal disease: Secondary | ICD-10-CM | POA: Insufficient documentation

## 2019-02-07 DIAGNOSIS — E039 Hypothyroidism, unspecified: Secondary | ICD-10-CM | POA: Insufficient documentation

## 2019-02-07 DIAGNOSIS — E119 Type 2 diabetes mellitus without complications: Secondary | ICD-10-CM | POA: Insufficient documentation

## 2019-02-07 DIAGNOSIS — Z79899 Other long term (current) drug therapy: Secondary | ICD-10-CM | POA: Insufficient documentation

## 2019-02-07 DIAGNOSIS — E1122 Type 2 diabetes mellitus with diabetic chronic kidney disease: Secondary | ICD-10-CM | POA: Insufficient documentation

## 2019-02-07 DIAGNOSIS — R0902 Hypoxemia: Secondary | ICD-10-CM

## 2019-02-07 DIAGNOSIS — U071 COVID-19: Secondary | ICD-10-CM | POA: Insufficient documentation

## 2019-02-07 DIAGNOSIS — J449 Chronic obstructive pulmonary disease, unspecified: Secondary | ICD-10-CM | POA: Insufficient documentation

## 2019-02-07 DIAGNOSIS — Z8673 Personal history of transient ischemic attack (TIA), and cerebral infarction without residual deficits: Secondary | ICD-10-CM | POA: Insufficient documentation

## 2019-02-07 DIAGNOSIS — R0602 Shortness of breath: Secondary | ICD-10-CM | POA: Diagnosis not present

## 2019-02-07 LAB — CBC WITH DIFFERENTIAL/PLATELET
Abs Immature Granulocytes: 0.06 10*3/uL (ref 0.00–0.07)
Basophils Absolute: 0 10*3/uL (ref 0.0–0.1)
Basophils Relative: 1 %
Eosinophils Absolute: 0 10*3/uL (ref 0.0–0.5)
Eosinophils Relative: 1 %
HCT: 33.2 % — ABNORMAL LOW (ref 36.0–46.0)
Hemoglobin: 10.1 g/dL — ABNORMAL LOW (ref 12.0–15.0)
Immature Granulocytes: 1 %
Lymphocytes Relative: 21 %
Lymphs Abs: 1.4 10*3/uL (ref 0.7–4.0)
MCH: 29.6 pg (ref 26.0–34.0)
MCHC: 30.4 g/dL (ref 30.0–36.0)
MCV: 97.4 fL (ref 80.0–100.0)
Monocytes Absolute: 0.8 10*3/uL (ref 0.1–1.0)
Monocytes Relative: 12 %
Neutro Abs: 4.2 10*3/uL (ref 1.7–7.7)
Neutrophils Relative %: 64 %
Platelets: 176 10*3/uL (ref 150–400)
RBC: 3.41 MIL/uL — ABNORMAL LOW (ref 3.87–5.11)
RDW: 17 % — ABNORMAL HIGH (ref 11.5–15.5)
WBC: 6.5 10*3/uL (ref 4.0–10.5)
nRBC: 0 % (ref 0.0–0.2)

## 2019-02-07 LAB — BASIC METABOLIC PANEL
Anion gap: 14 (ref 5–15)
BUN: 41 mg/dL — ABNORMAL HIGH (ref 8–23)
CO2: 27 mmol/L (ref 22–32)
Calcium: 8.2 mg/dL — ABNORMAL LOW (ref 8.9–10.3)
Chloride: 91 mmol/L — ABNORMAL LOW (ref 98–111)
Creatinine, Ser: 6.84 mg/dL — ABNORMAL HIGH (ref 0.44–1.00)
GFR calc Af Amer: 6 mL/min — ABNORMAL LOW (ref >60)
GFR calc non Af Amer: 5 mL/min — ABNORMAL LOW (ref >60)
Glucose, Bld: 78 mg/dL (ref 70–99)
Potassium: 4.9 mmol/L (ref 3.5–5.1)
Sodium: 132 mmol/L — ABNORMAL LOW (ref 135–145)

## 2019-02-07 LAB — SARS CORONAVIRUS 2 (TAT 6-24 HRS): SARS Coronavirus 2: POSITIVE — AB

## 2019-02-07 NOTE — ED Provider Notes (Signed)
[facility] EMERGENCY DEPARTMENT Provider Note   CSN: [id number] Arrival date & time: 02/07/19  1241     History Chief Complaint  Patient presents with  . Shortness of Breath     HPI   Blood pressure (!) 151/89, pulse 98, temperature 97.7 F (36.5 C), temperature source Oral, resp. rate 20, SpO2 97 %.  Joan Mosley is a 74 y.o. female with past medical history significant for COPD (on 5 L via nasal cannula at all times), ESRD on dialysis, she was on her way via [facility] to dialysis and oxygen levels are reading in the 80s, patient put on nonrebreather at 15 L/min and increased to 97%, she is now on her typical 5 to 6 L via nasal cannula oxygen saturation is 97%.  Patient is reporting a mild shortness of breath, when I asked her why she is in the emergency department she says because her son made her come in.  She has some cough that is typical for her baseline without change in sputum production.  She denies any fevers, chills, chest pain, nausea, vomiting.        Past Medical History:  Diagnosis Date  . Arthritis   . COPD (chronic obstructive pulmonary disease) ([place])   . Diabetes mellitus without complication ([place])   . Diabetic neuropathy ([place])   . 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 Renal disorder    ESRD  . Thyroid disease   . TIA (transient ischemic attack)     Patient Active Problem List   Diagnosis Date Noted  . Hypotension 12/24/2018  . Aspiration pneumonia of right lower lobe ([place])   . Palliative care encounter   . COPD exacerbation ([place]) 11/11/2018  . TIA (transient ischemic attack)   . HLD (hyperlipidemia)   . Type II diabetes mellitus with renal manifestations ([place])   . Depression with anxiety   . COPD with acute exacerbation ([place]) 09/30/2018  . Acute respiratory failure with hypoxia ([place]) 09/24/2018  . Advanced care planning/counseling discussion   . Goals of care, counseling/discussion   . Palliative care by specialist   .  Acute on chronic respiratory failure with hypoxia and hypercapnia ([place]) 09/15/2018  . Pulmonary edema 09/08/2018  . Respiratory failure ([place]) 09/08/2018  . Weakness   . SDH (subdural hematoma) ([place]) 09/06/2018  . Chronic respiratory failure with hypoxia, on home O2 therapy ([place]) 09/06/2018  . Subdural hematoma ([place]) 08/22/2018  . Elevated troponin 08/22/2018  . ESRD (end stage renal disease) ([place]) 08/22/2018  . Anxiety 07/04/2018  . Counseling regarding advanced directives and goals of care 07/01/2018  . Full code status 07/01/2018  . Drug-induced constipation 07/01/2018  . Hemodialysis-associated hypotension 07/01/2018  . Dialysis patient ([place]) 07/01/2018  . HOH (hard of hearing) 07/01/2018  . Stage 5 chronic kidney disease on chronic dialysis ([place]) 07/01/2018  . Type 2 diabetes mellitus with diabetic polyneuropathy, with long-term current use of insulin ([place]) 07/01/2018  . Hypothyroidism 07/01/2018  . Gait instability 07/01/2018  . Hyperkalemia 04/20/2018  . Cerebral infarction, unspecified ([place]) 04/05/2018  . Chronic diastolic (congestive) heart failure ([place]) 04/05/2018  . Diarrhea, unspecified 04/05/2018  . Pain, unspecified 04/05/2018  . Pruritus, unspecified 04/05/2018  . Shortness of breath 04/05/2018  . Coagulation defect, unspecified ([place]) 03/31/2018  . Anemia in chronic kidney disease 03/25/2018  . Iron deficiency anemia, unspecified 03/25/2018  . Secondary hyperparathyroidism of renal origin ([place]) 03/25/2018    Past Surgical History:  Procedure Laterality Date  . ABDOMINAL  HYSTERECTOMY    . IR FLUORO GUIDE CV LINE RIGHT  11/14/2018  . RECTOCELE REPAIR    . REPLACEMENT TOTAL KNEE BILATERAL Bilateral 2008  . THYROIDECTOMY     80%  . VAGINAL WOUND CLOSURE / REPAIR       OB History   No obstetric history on file.     Family History  Problem Relation Age of Onset  . Heart disease Mother   . Hypertension Mother   . Heart disease Father   . Hypertension  Father     Social History   Tobacco Use  . Smoking status: Former Smoker    Packs/day: 1.00    Years: 35.00    Pack years: 35.00    Quit date: 03/29/1981    Years since quitting: 37.8  . Smokeless tobacco: Never Used  Substance Use Topics  . Alcohol use: Never  . Drug use: Never    Home Medications Prior to Admission medications   Medication Sig Start Date End Date Taking? Authorizing Provider  acetaminophen (TYLENOL) 500 MG tablet Take 1,000 mg by mouth every 6 (six) hours as needed (for arthritic pain).     [provider]  albuterol (PROVENTIL) (2.5 MG/3ML) 0.083% nebulizer solution Take 3 mLs (2.5 mg total) by nebulization 4 (four) times daily. Patient not taking: Reported on 12/24/2018 09/16/18   Mariel Aloe, MD  benzonatate (TESSALON) 200 MG capsule Take 1 capsule (200 mg total) by mouth 3 (three) times daily as needed for cough. Patient not taking: Reported on 12/24/2018 11/21/18   Hosie Poisson, MD  calcitRIOL (ROCALTROL) 0.25 MCG capsule Take 7 capsules (1.75 mcg total) by mouth Every Tuesday,Thursday,and Saturday with dialysis. 11/22/18   Hosie Poisson, MD  cinacalcet (SENSIPAR) 30 MG tablet Take 2 tablets (60 mg total) by mouth Every Tuesday,Thursday,and Saturday with dialysis. 11/22/18   Hosie Poisson, MD  clonazePAM (KLONOPIN) 0.5 MG tablet Take 1 tablet (0.5 mg total) by mouth 2 (two) times daily as needed for anxiety. 12/28/18 01/27/19  Nche, Charlene Brooke, NP  Darbepoetin Alfa (ARANESP) 25 MCG/0.42ML SOSY injection Inject 0.42 mLs (25 mcg total) into the vein every Tuesday with hemodialysis. 11/22/18   Hosie Poisson, MD  docusate sodium (COLACE) 100 MG capsule Take 100 mg by mouth every morning.    [provider]  FLUoxetine (PROZAC) 10 MG tablet Take 1 tablet (10 mg total) by mouth daily. 08/08/18   Nche, Charlene Brooke, NP  furosemide (LASIX) 40 MG tablet Take 80 mg by mouth See admin instructions. Take 80 mg by mouth in the morning on Sun/Mon/Wed/Fri  (non-dialysis days) 10/13/18   [provider]  insulin detemir (LEVEMIR) 100 UNIT/ML injection Inject 0.1 mLs (10 Units total) into the skin daily. Patient taking differently: Inject 15 Units into the skin daily before breakfast.  11/22/18   Hosie Poisson, MD  insulin lispro (HUMALOG) 100 UNIT/ML injection Inject 1-2 Units into the skin See admin instructions. Inject 1-2 units into the skin three times a day before meals, per sliding scale: 1 unit for every 50 points above a BGL of 150    [provider]  ipratropium-albuterol (DUONEB) 0.5-2.5 (3) MG/3ML SOLN Take 3 mLs by nebulization See admin instructions. Nebulize and inhale 3 ml's into the lungs every four hours, while awake    [provider]  levalbuterol (XOPENEX HFA) 45 MCG/ACT inhaler Inhale 2 puffs into the lungs 4 (four) times daily as needed for wheezing or shortness of breath.    [provider]  levothyroxine (SYNTHROID) 112 MCG tablet Take 1 tablet (112 mcg total) by mouth daily before breakfast. 1 AM Patient taking differently: Take 112 mcg by mouth daily before breakfast.  07/04/18   Nche, Charlene Brooke, NP  lidocaine (LIDODERM) 5 % Place 1 patch onto the skin every 12 (twelve) hours as needed (back pain). Remove & Discard patch within 12 hours or as directed by MD     [provider]  Lubricants (K-Y LUBRICANT JELLY SENSITIVE EX) Place 1 application into both nostrils as needed (as directed- for lubrication).     [provider]  midodrine (PROAMATINE) 10 MG tablet Take 10 mg by mouth See admin instructions. Take 10 mg by mouth on Tuesday, Thursday, Saturday 45 minutes before dialysis    [provider]  montelukast (SINGULAIR) 10 MG tablet Take 10 mg by mouth at bedtime.     [provider]  multivitamin (RENA-VIT) TABS tablet Take 1 tablet by mouth at bedtime. Patient not taking: Reported on 12/24/2018 11/21/18   Hosie Poisson, MD  NASAL SPRAY SALINE NA Place 1-2  sprays into both nostrils as needed (for congestion- "[employer] Simply Saline" brand).    [provider]  Nutritional Supplements (FEEDING SUPPLEMENT, NEPRO CARB STEADY,) LIQD Take 237 mLs by mouth 2 (two) times daily between meals. Patient not taking: Reported on 12/24/2018 11/22/18   Hosie Poisson, MD  nystatin (MYCOSTATIN/NYSTOP) powder Apply 1 g topically 2 (two) times daily as needed (as directed- to any rashes).     [provider]  nystatin cream (MYCOSTATIN) Apply 1 application topically 2 (two) times daily as needed for dry skin.     [provider]  OXYGEN Inhale 5-6 L/min into the lungs continuous.    [provider]  oxymetazoline (AFRIN) 0.05 % nasal spray Place 1 spray into both nostrils 2 (two) times daily as needed for congestion.    [provider]  rosuvastatin (CRESTOR) 40 MG tablet Take 40 mg by mouth every morning.     [provider]  senna (SENOKOT) 8.6 MG TABS tablet Take 1 tablet by mouth every morning.    [provider]  sevelamer carbonate (RENVELA) 800 MG tablet Take 2 tablets (1,600 mg total) by mouth 3 (three) times daily with meals. Patient taking differently: Take 800-1,600 mg by mouth See admin instructions. Take 1,600 mg by mouth three times a day with meals and 800-1,600 mg with each snack 11/21/18   Hosie Poisson, MD    Allergies    Avelox [moxifloxacin hcl in nacl], Codeine, Penicillins, Sulfa antibiotics, Dextromethorphan-guaifenesin, and Shellfish allergy  Review of Systems   Review of Systems  A complete review of systems was obtained and all systems are negative except as noted in the HPI and PMH.   Physical Exam Updated Vital Signs BP 126/78   Pulse 99   Temp 97.7 F (36.5 C) (Oral)   Resp (!) 24   SpO2 99%   Physical Exam Vitals and nursing note reviewed.  Constitutional:      General: She is not in acute distress.    Appearance: She is well-developed. She is not  diaphoretic.  HENT:     Head: Normocephalic and atraumatic.  Eyes:     Conjunctiva/sclera: Conjunctivae normal.     Pupils: Pupils are equal, round, and reactive to light.  Cardiovascular:     Rate and Rhythm: Normal rate and regular rhythm.  Pulmonary:     Effort: Pulmonary effort is normal.  Comments: Slightly reduced air movement in all fields especially at the bases, mild, scattered, expiratory wheezing  Dialysis catheter in right upper chest Abdominal:     Palpations: Abdomen is soft.     Tenderness: There is no abdominal tenderness.  Musculoskeletal:        General: Normal range of motion.     Cervical back: Normal range of motion.  Neurological:     Mental Status: She is alert and oriented to person, place, and time.     ED Results / Procedures / Treatments   Labs (all labs ordered are listed, but only abnormal results are displayed) Labs Reviewed  CBC WITH DIFFERENTIAL/PLATELET - Abnormal; Notable for the following components:      Result Value   RBC 3.41 (*)    Hemoglobin 10.1 (*)    HCT 33.2 (*)    RDW 17.0 (*)    All other components within normal limits  BASIC METABOLIC PANEL - Abnormal; Notable for the following components:   Sodium 132 (*)    Chloride 91 (*)    BUN 41 (*)    Creatinine, Ser 6.84 (*)    Calcium 8.2 (*)    GFR calc non Af Amer 5 (*)    GFR calc Af Amer 6 (*)    All other components within normal limits    EKG EKG Interpretation  Date/Time:  Tuesday February 07 2019 12:51:15 EST Ventricular Rate:  92 PR Interval:    QRS Duration: 94 QT Interval:  355 QTC Calculation: 440 R Axis:   12 Text Interpretation: Sinus rhythm Borderline prolonged PR interval Confirmed by Davonna Belling [phone]) on 02/07/2019 1:33:02 PM   Radiology DG Chest Port 1 View  Result Date: 02/07/2019 CLINICAL DATA:  Shortness of breath, hypoxemia EXAM: PORTABLE CHEST 1 VIEW COMPARISON:  Portable exam 1332 hours compared to 01/05/2019 FINDINGS: RIGHT jugular  line with tip projecting over cavoatrial junction. Normal heart size, mediastinal contours, and pulmonary vascularity. Atherosclerotic calcification aorta. Mild bibasilar atelectasis. Minimal blunting of the RIGHT costophrenic angle by tiny effusion unchanged. Remaining lungs clear. No acute infiltrate or pneumothorax. IMPRESSION: Bibasilar atelectasis and tiny RIGHT pleural effusion. No new abnormalities. Aortic Atherosclerosis (ICD10-I70.0). Electronically Signed   By: Lavonia [name] M.D.   On: 02/07/2019 13:56    Procedures Procedures (including critical care time)  Medications Ordered in ED Medications - No data to display  ED Course  I have reviewed the triage vital signs and the nursing notes.  Pertinent labs & imaging results that were available during my care of the patient were reviewed by me and considered in my medical decision making (see chart for details).    MDM Rules/Calculators/A&P                     Vitals:   02/07/19 1254 02/07/19 1300 02/07/19 1400 02/07/19 1500  BP: (!) 151/89 (!) 142/93 (!) 146/92 126/78  Pulse: 98 95 92 99  Resp: 20 (!) 22 (!) 23 (!) 24  Temp: 97.7 F (36.5 C)     TempSrc: Oral     SpO2: 97% 94% 99% 99%    Joan Mosley is 74 y.o. female presenting with hypoxia noticed on route to dialysis.  Patient with really no complaints at this time, lung scans slightly reduced air movement, mild scattered expiratory wheezing, chest x-ray without acute findings.  Patient has been saturating over 95% on her typical 5 to 6 L via nasal cannula.  EKG with no acute findings,  blood work reassuring.  Patient is comfortable with discharge.  Discussed case with attending physician who agrees with care plan and disposition.   Evaluation does not show pathology that would require ongoing emergent intervention or inpatient treatment. Pt is hemodynamically stable and mentating appropriately. Discussed findings and plan with patient/guardian, who agrees with care plan.  All questions answered. Return precautions discussed and outpatient follow up given.    Final Clinical Impression(s) / ED Diagnoses Final diagnoses:  Hypoxia    Rx / DC Orders ED Discharge Orders    None       Karen Kays Charna Elizabeth 02/07/19 [place], Nathan, MD 02/08/19 [phone]

## 2019-02-07 NOTE — ED Notes (Signed)
PTAR called @ 1614-per Maudie Mercury, RN called by Levada Dy

## 2019-02-07 NOTE — Discharge Instructions (Signed)
Please follow with your primary care doctor in the next 2 days for a check-up. They must obtain records for further management.  ° °Do not hesitate to return to the Emergency Department for any new, worsening or concerning symptoms.  ° °

## 2019-02-07 NOTE — ED Triage Notes (Addendum)
Pt scheduled for dialysis today. PTAR picked up from home to bring to dialysis, Dr. Linda Hedges instructed PTAR to bring here first due to pulse ox reading in the 80s. Pt put on NRB 15 L/min increasing to 97%. Pt has hx of pneumonia multiple times in recent past. Pt wears 5-6 L/min [place] at home. Pt 97% on 5 L/min [place] on arrival

## 2019-02-07 NOTE — ED Notes (Signed)
Joan Mosley (son): [phone]

## 2019-02-08 ENCOUNTER — Telehealth (INDEPENDENT_AMBULATORY_CARE_PROVIDER_SITE_OTHER): Payer: Medicare Other | Admitting: Nurse Practitioner

## 2019-02-08 ENCOUNTER — Encounter: Payer: Self-pay | Admitting: Nurse Practitioner

## 2019-02-08 ENCOUNTER — Telehealth: Payer: Self-pay | Admitting: Nurse Practitioner

## 2019-02-08 VITALS — Temp 97.8°F | Ht 61.0 in

## 2019-02-08 DIAGNOSIS — U071 COVID-19: Secondary | ICD-10-CM | POA: Diagnosis present

## 2019-02-08 DIAGNOSIS — J441 Chronic obstructive pulmonary disease with (acute) exacerbation: Secondary | ICD-10-CM

## 2019-02-08 DIAGNOSIS — J9611 Chronic respiratory failure with hypoxia: Secondary | ICD-10-CM

## 2019-02-08 DIAGNOSIS — F419 Anxiety disorder, unspecified: Secondary | ICD-10-CM

## 2019-02-08 DIAGNOSIS — Z9981 Dependence on supplemental oxygen: Secondary | ICD-10-CM

## 2019-02-08 DIAGNOSIS — E039 Hypothyroidism, unspecified: Secondary | ICD-10-CM

## 2019-02-08 DIAGNOSIS — E782 Mixed hyperlipidemia: Secondary | ICD-10-CM

## 2019-02-08 MED ORDER — FLUOXETINE HCL 10 MG PO TABS: 10.0000 mg | ORAL_TABLET | Freq: Every day | ORAL | 3 refills | Status: DC

## 2019-02-08 MED ORDER — ROSUVASTATIN CALCIUM 40 MG PO TABS: 40.0000 mg | ORAL_TABLET | Freq: Every day | ORAL | 1 refills | Status: DC

## 2019-02-08 MED ORDER — LEVALBUTEROL TARTRATE 45 MCG/ACT IN AERO: 2.0000 | INHALATION_SPRAY | Freq: Three times a day (TID) | RESPIRATORY_TRACT | 2 refills | Status: DC | PRN

## 2019-02-08 MED ORDER — CLONAZEPAM 0.5 MG PO TABS: ORAL_TABLET | ORAL | 0 refills | Status: DC

## 2019-02-08 MED ORDER — LEVOTHYROXINE SODIUM 112 MCG PO TABS: 112.0000 ug | ORAL_TABLET | Freq: Every day | ORAL | 0 refills | Status: DC

## 2019-02-08 MED ORDER — BENZONATATE 200 MG PO CAPS: 200.0000 mg | ORAL_CAPSULE | Freq: Two times a day (BID) | ORAL | 0 refills | Status: DC | PRN

## 2019-02-08 NOTE — Patient Instructions (Addendum)
You will be contacted by [facility]. Go to hospital if symptoms worsen. Inform dialysis center about diagnosis and to make arrangements for dialysis treatment. Inquire from dialysis center if these labs can be drawn: HgbA1c, hepatic panel,TSH, T3 and T4 ?     Person Under Monitoring Name: Joan Mosley  Location: [place] [address]   Infection Prevention Recommendations for Individuals Confirmed to have, or Being Evaluated for, 2019 Novel Coronavirus (COVID-19) Infection Who Receive Care at Home  Individuals who are confirmed to have, or are being evaluated for, COVID-19 should follow the prevention steps below until a healthcare provider or local or state health department says they can return to normal activities.  Stay home except to get medical care You should restrict activities outside your home, except for getting medical care. Do not go to work, school, or public areas, and do not use public transportation or taxis.  Call ahead before visiting your doctor Before your medical appointment, call the healthcare provider and tell them that you have, or are being evaluated for, COVID-19 infection. This will help the healthcare provider's office take steps to keep other people from getting infected. Ask your healthcare provider to call the local or state health department.  Monitor your symptoms Seek prompt medical attention if your illness is worsening (e.g., difficulty breathing). Before going to your medical appointment, call the healthcare provider and tell them that you have, or are being evaluated for, COVID-19 infection. Ask your healthcare provider to call the local or state health department.  Wear a facemask You should wear a facemask that covers your nose and mouth when you are in the same room with other people and when you visit a healthcare provider. People who live with or visit you should also wear a facemask while they  are in the same room with you.  Separate yourself from other people in your home As much as possible, you should stay in a different room from other people in your home. Also, you should use a separate bathroom, if available.  Avoid sharing household items You should not share dishes, drinking glasses, cups, eating utensils, towels, bedding, or other items with other people in your home. After using these items, you should wash them thoroughly with soap and water.  Cover your coughs and sneezes Cover your mouth and nose with a tissue when you cough or sneeze, or you can cough or sneeze into your sleeve. Throw used tissues in a lined trash can, and immediately wash your hands with soap and water for at least 20 seconds or use an alcohol-based hand rub.  Wash your [facility] your hands often and thoroughly with soap and water for at least 20 seconds. You can use an alcohol-based hand sanitizer if soap and water are not available and if your hands are not visibly dirty. Avoid touching your eyes, nose, and mouth with unwashed hands.   Prevention Steps for Caregivers and Household Members of Individuals Confirmed to have, or Being Evaluated for, COVID-19 Infection Being Cared for in the Home  If you live with, or provide care at home for, a person confirmed to have, or being evaluated for, COVID-19 infection please follow these guidelines to prevent infection:  Follow healthcare provider's instructions Make sure that you understand and can help the patient follow any healthcare provider instructions for all care.  Provide for the patient's basic needs You should help the patient with basic needs in the home and  provide support for getting groceries, prescriptions, and other personal needs.  Monitor the patient's symptoms If they are getting sicker, call his or her medical provider and tell them that the patient has, or is being evaluated for, COVID-19 infection. This will help the  healthcare provider's office take steps to keep other people from getting infected. Ask the healthcare provider to call the local or state health department.  Limit the number of people who have contact with the patient  If possible, have only one caregiver for the patient.  Other household members should stay in another home or place of residence. If this is not possible, they should stay  in another room, or be separated from the patient as much as possible. Use a separate bathroom, if available.  Restrict visitors who do not have an essential need to be in the home.  Keep older adults, very young children, and other sick people away from the patient Keep older adults, very young children, and those who have compromised immune systems or chronic health conditions away from the patient. This includes people with chronic heart, lung, or kidney conditions, diabetes, and cancer.  Ensure good ventilation Make sure that shared spaces in the home have good air flow, such as from an air conditioner or an opened window, weather permitting.  Wash your hands often  Wash your hands often and thoroughly with soap and water for at least 20 seconds. You can use an alcohol based hand sanitizer if soap and water are not available and if your hands are not visibly dirty.  Avoid touching your eyes, nose, and mouth with unwashed hands.  Use disposable paper towels to dry your hands. If not available, use dedicated cloth towels and replace them when they become wet.  Wear a facemask and gloves  Wear a disposable facemask at all times in the room and gloves when you touch or have contact with the patient's blood, body fluids, and/or secretions or excretions, such as sweat, saliva, sputum, nasal mucus, vomit, urine, or feces.  Ensure the mask fits over your nose and mouth tightly, and do not touch it during use.  Throw out disposable facemasks and gloves after using them. Do not reuse.  Wash your hands  immediately after removing your facemask and gloves.  If your personal clothing becomes contaminated, carefully remove clothing and launder. Wash your hands after handling contaminated clothing.  Place all used disposable facemasks, gloves, and other waste in a lined container before disposing them with other household waste.  Remove gloves and wash your hands immediately after handling these items.  Do not share dishes, glasses, or other household items with the patient  Avoid sharing household items. You should not share dishes, drinking glasses, cups, eating utensils, towels, bedding, or other items with a patient who is confirmed to have, or being evaluated for, COVID-19 infection.  After the person uses these items, you should wash them thoroughly with soap and water.  Wash laundry thoroughly  Immediately remove and wash clothes or bedding that have blood, body fluids, and/or secretions or excretions, such as sweat, saliva, sputum, nasal mucus, vomit, urine, or feces, on them.  Wear gloves when handling laundry from the patient.  Read and follow directions on labels of laundry or clothing items and detergent. In general, wash and dry with the warmest temperatures recommended on the label.  Clean all areas the individual has used often  Clean all touchable surfaces, such as counters, tabletops, doorknobs, bathroom fixtures, toilets, phones, keyboards,  tablets, and bedside tables, every day. Also, clean any surfaces that may have blood, body fluids, and/or secretions or excretions on them.  Wear gloves when cleaning surfaces the patient has come in contact with.  Use a diluted bleach solution (e.g., dilute bleach with 1 part bleach and 10 parts water) or a household disinfectant with a label that says EPA-registered for coronaviruses. To make a bleach solution at home, add 1 tablespoon of bleach to 1 quart (4 cups) of water. For a larger supply, add  cup of bleach to 1 gallon (16  cups) of water.  Read labels of cleaning products and follow recommendations provided on product labels. Labels contain instructions for safe and effective use of the cleaning product including precautions you should take when applying the product, such as wearing gloves or eye protection and making sure you have good ventilation during use of the product.  Remove gloves and wash hands immediately after cleaning.  Monitor yourself for signs and symptoms of illness Caregivers and household members are considered close contacts, should monitor their health, and will be asked to limit movement outside of the home to the extent possible. Follow the monitoring steps for close contacts listed on the symptom monitoring form.   ? If you have additional questions, contact your local health department or call the epidemiologist on call at [phone] (available 24/7). ? This guidance is subject to change. For the most up-to-date guidance from [facility], please refer to their website: [email or web address]

## 2019-02-08 NOTE — Telephone Encounter (Signed)
Called to Discuss with patient about Covid symptoms and the use of bamlanivimab, a monoclonal antibody infusion for those with mild to moderate Covid symptoms and at a high risk of hospitalization.     Pt is qualified for this infusion at the [facility] center due to co-morbid conditions and/or a member of an at-risk group.     Unable to reach pt  

## 2019-02-08 NOTE — Progress Notes (Signed)
Virtual Visit via Video Note  I connected with Joan Mosley on 02/08/19 at  1:00 PM EST by a video enabled telemedicine application and verified that I am speaking with the correct person using two identifiers.  Location: Patient:home Provider: office Participants: patient son-Mark, and daughter-[name] I discussed the limitations of evaluation and management by telemedicine and the availability of in person appointments. The patient expressed understanding and agreed to proceed.  CC: pt tested positive for covid last night from hospital/deep cough,congestion,nausea,headache,bodyache,SOB at times/ robitussion DM/ Blood sugar today 131  History of Present Illness: Cough This is a new problem. The current episode started yesterday. The problem has been unchanged. The cough is non-productive. Associated symptoms include shortness of breath and wheezing. Pertinent negatives include no nasal congestion or rhinorrhea. Nothing aggravates the symptoms. She has tried a beta-agonist inhaler and OTC cough suppressant for the symptoms. Her past medical history is significant for COPD. There is no history of pneumonia.  SOB is worse with coughing spells. Positive COVID test 02/07/2019, CXR negative for pneumonia. Elevate BP noted during ED visit yesterday.  Reviewed lab results and CXR report completed 02/07/2019.  Observations/Objective: Physical Exam  Constitutional: No distress.  Pulmonary/Chest: Effort normal.  Neurological: She is alert.  Oriented to place, person and her family  Psychiatric: Her behavior is normal.  unable to provide BP reading today Assessment and Plan: Alasha was seen today for cough and medication refill.  Diagnoses and all orders for this visit:  COVID-19 -     benzonatate (TESSALON) 200 MG capsule; Take 1 capsule (200 mg total) by mouth 2 (two) times daily as needed for cough.  Hypothyroidism, unspecified type -     levothyroxine (SYNTHROID) 112 MCG tablet; Take 1  tablet (112 mcg total) by mouth daily before breakfast. 1 AM  Anxiety -     FLUoxetine (PROZAC) 10 MG tablet; Take 1 tablet (10 mg total) by mouth daily. -     clonazePAM (KLONOPIN) 0.5 MG tablet; 0.25mg  in AM and 0.5mg  in PM  Chronic respiratory failure with hypoxia, on home O2 therapy (HCC) -     levalbuterol (XOPENEX HFA) 45 MCG/ACT inhaler; Inhale 2 puffs into the lungs every 8 (eight) hours as needed for wheezing or shortness of breath.  COPD with acute exacerbation (HCC) -     levalbuterol (XOPENEX HFA) 45 MCG/ACT inhaler; Inhale 2 puffs into the lungs every 8 (eight) hours as needed for wheezing or shortness of breath. -     benzonatate (TESSALON) 200 MG capsule; Take 1 capsule (200 mg total) by mouth 2 (two) times daily as needed for cough.  Mixed hyperlipidemia -     rosuvastatin (CRESTOR) 40 MG tablet; Take 1 tablet (40 mg total) by mouth at bedtime.   Follow Up Instructions: See avs   I discussed the assessment and treatment plan with the patient. The patient was provided an opportunity to ask questions and all were answered. The patient agreed with the plan and demonstrated an understanding of the instructions.   The patient was advised to call back or seek an in-person evaluation if the symptoms worsen or if the condition fails to improve as anticipated.  Wilfred Lacy, NP

## 2019-02-09 ENCOUNTER — Emergency Department (HOSPITAL_COMMUNITY): Payer: Medicare Other

## 2019-02-09 ENCOUNTER — Encounter (HOSPITAL_COMMUNITY): Payer: Self-pay | Admitting: Emergency Medicine

## 2019-02-09 ENCOUNTER — Telehealth: Payer: Self-pay | Admitting: Nurse Practitioner

## 2019-02-09 ENCOUNTER — Inpatient Hospital Stay (HOSPITAL_COMMUNITY)
Admission: EM | Admit: 2019-02-09 | Discharge: 2019-02-22 | DRG: 177 | Disposition: A | Discharge status: Other Acute Inpatient Hospital | Payer: Medicare Other | Attending: Internal Medicine | Admitting: Internal Medicine

## 2019-02-09 DIAGNOSIS — Z882 Allergy status to sulfonamides status: Secondary | ICD-10-CM

## 2019-02-09 DIAGNOSIS — M25562 Pain in left knee: Secondary | ICD-10-CM | POA: Diagnosis not present

## 2019-02-09 DIAGNOSIS — Q211 Atrial septal defect: Secondary | ICD-10-CM | POA: Diagnosis not present

## 2019-02-09 DIAGNOSIS — Z880 Allergy status to penicillin: Secondary | ICD-10-CM

## 2019-02-09 DIAGNOSIS — I444 Left anterior fascicular block: Secondary | ICD-10-CM | POA: Diagnosis present

## 2019-02-09 DIAGNOSIS — T17908A Unspecified foreign body in respiratory tract, part unspecified causing other injury, initial encounter: Secondary | ICD-10-CM

## 2019-02-09 DIAGNOSIS — Z9981 Dependence on supplemental oxygen: Secondary | ICD-10-CM

## 2019-02-09 DIAGNOSIS — U071 COVID-19: Principal | ICD-10-CM | POA: Diagnosis present

## 2019-02-09 DIAGNOSIS — R0602 Shortness of breath: Secondary | ICD-10-CM

## 2019-02-09 DIAGNOSIS — Z91013 Allergy to seafood: Secondary | ICD-10-CM | POA: Diagnosis not present

## 2019-02-09 DIAGNOSIS — L89626 Pressure-induced deep tissue damage of left heel: Secondary | ICD-10-CM | POA: Diagnosis present

## 2019-02-09 DIAGNOSIS — J440 Chronic obstructive pulmonary disease with acute lower respiratory infection: Secondary | ICD-10-CM | POA: Diagnosis present

## 2019-02-09 DIAGNOSIS — J1282 Pneumonia due to coronavirus disease 2019: Secondary | ICD-10-CM | POA: Diagnosis present

## 2019-02-09 DIAGNOSIS — R064 Hyperventilation: Secondary | ICD-10-CM

## 2019-02-09 DIAGNOSIS — J449 Chronic obstructive pulmonary disease, unspecified: Secondary | ICD-10-CM

## 2019-02-09 DIAGNOSIS — J690 Pneumonitis due to inhalation of food and vomit: Secondary | ICD-10-CM | POA: Diagnosis not present

## 2019-02-09 DIAGNOSIS — I5032 Chronic diastolic (congestive) heart failure: Secondary | ICD-10-CM | POA: Diagnosis present

## 2019-02-09 DIAGNOSIS — M1990 Unspecified osteoarthritis, unspecified site: Secondary | ICD-10-CM | POA: Diagnosis present

## 2019-02-09 DIAGNOSIS — E1122 Type 2 diabetes mellitus with diabetic chronic kidney disease: Secondary | ICD-10-CM | POA: Diagnosis present

## 2019-02-09 DIAGNOSIS — Z885 Allergy status to narcotic agent status: Secondary | ICD-10-CM | POA: Diagnosis not present

## 2019-02-09 DIAGNOSIS — Z8673 Personal history of transient ischemic attack (TIA), and cerebral infarction without residual deficits: Secondary | ICD-10-CM

## 2019-02-09 DIAGNOSIS — D631 Anemia in chronic kidney disease: Secondary | ICD-10-CM | POA: Diagnosis present

## 2019-02-09 DIAGNOSIS — Z6841 Body Mass Index (BMI) 40.0 and over, adult: Secondary | ICD-10-CM | POA: Diagnosis not present

## 2019-02-09 DIAGNOSIS — J9621 Acute and chronic respiratory failure with hypoxia: Secondary | ICD-10-CM | POA: Diagnosis present

## 2019-02-09 DIAGNOSIS — Z87891 Personal history of nicotine dependence: Secondary | ICD-10-CM

## 2019-02-09 DIAGNOSIS — Z992 Dependence on renal dialysis: Secondary | ICD-10-CM | POA: Diagnosis not present

## 2019-02-09 DIAGNOSIS — Z79899 Other long term (current) drug therapy: Secondary | ICD-10-CM

## 2019-02-09 DIAGNOSIS — M7989 Other specified soft tissue disorders: Secondary | ICD-10-CM | POA: Diagnosis not present

## 2019-02-09 DIAGNOSIS — E785 Hyperlipidemia, unspecified: Secondary | ICD-10-CM | POA: Diagnosis present

## 2019-02-09 DIAGNOSIS — E1142 Type 2 diabetes mellitus with diabetic polyneuropathy: Secondary | ICD-10-CM

## 2019-02-09 DIAGNOSIS — N186 End stage renal disease: Secondary | ICD-10-CM | POA: Diagnosis present

## 2019-02-09 DIAGNOSIS — Z881 Allergy status to other antibiotic agents status: Secondary | ICD-10-CM

## 2019-02-09 DIAGNOSIS — D509 Iron deficiency anemia, unspecified: Secondary | ICD-10-CM | POA: Diagnosis present

## 2019-02-09 DIAGNOSIS — I9589 Other hypotension: Secondary | ICD-10-CM | POA: Diagnosis present

## 2019-02-09 DIAGNOSIS — M25561 Pain in right knee: Secondary | ICD-10-CM | POA: Diagnosis present

## 2019-02-09 DIAGNOSIS — N250 Renal osteodystrophy: Secondary | ICD-10-CM | POA: Diagnosis present

## 2019-02-09 DIAGNOSIS — E039 Hypothyroidism, unspecified: Secondary | ICD-10-CM | POA: Diagnosis present

## 2019-02-09 DIAGNOSIS — Z90710 Acquired absence of both cervix and uterus: Secondary | ICD-10-CM

## 2019-02-09 DIAGNOSIS — R0600 Dyspnea, unspecified: Secondary | ICD-10-CM

## 2019-02-09 DIAGNOSIS — H9190 Unspecified hearing loss, unspecified ear: Secondary | ICD-10-CM | POA: Diagnosis present

## 2019-02-09 DIAGNOSIS — Z96653 Presence of artificial knee joint, bilateral: Secondary | ICD-10-CM | POA: Diagnosis present

## 2019-02-09 DIAGNOSIS — Z09 Encounter for follow-up examination after completed treatment for conditions other than malignant neoplasm: Secondary | ICD-10-CM

## 2019-02-09 DIAGNOSIS — Z794 Long term (current) use of insulin: Secondary | ICD-10-CM

## 2019-02-09 DIAGNOSIS — Z79890 Hormone replacement therapy (postmenopausal): Secondary | ICD-10-CM

## 2019-02-09 DIAGNOSIS — Z8249 Family history of ischemic heart disease and other diseases of the circulatory system: Secondary | ICD-10-CM

## 2019-02-09 LAB — COMPREHENSIVE METABOLIC PANEL
ALT: 13 U/L (ref 0–44)
AST: 24 U/L (ref 15–41)
Albumin: 2.7 g/dL — ABNORMAL LOW (ref 3.5–5.0)
Alkaline Phosphatase: 86 U/L (ref 38–126)
Anion gap: 18 — ABNORMAL HIGH (ref 5–15)
BUN: 15 mg/dL (ref 8–23)
CO2: 26 mmol/L (ref 22–32)
Calcium: 8.1 mg/dL — ABNORMAL LOW (ref 8.9–10.3)
Chloride: 92 mmol/L — ABNORMAL LOW (ref 98–111)
Creatinine, Ser: 4.04 mg/dL — ABNORMAL HIGH (ref 0.44–1.00)
GFR calc Af Amer: 12 mL/min — ABNORMAL LOW (ref >60)
GFR calc non Af Amer: 10 mL/min — ABNORMAL LOW (ref >60)
Glucose, Bld: 105 mg/dL — ABNORMAL HIGH (ref 70–99)
Potassium: 4.1 mmol/L (ref 3.5–5.1)
Sodium: 136 mmol/L (ref 135–145)
Total Bilirubin: 1.3 mg/dL — ABNORMAL HIGH (ref 0.3–1.2)
Total Protein: 5.5 g/dL — ABNORMAL LOW (ref 6.5–8.1)

## 2019-02-09 LAB — CBC
HCT: 33.8 % — ABNORMAL LOW (ref 36.0–46.0)
Hemoglobin: 10.1 g/dL — ABNORMAL LOW (ref 12.0–15.0)
MCH: 28.8 pg (ref 26.0–34.0)
MCHC: 29.9 g/dL — ABNORMAL LOW (ref 30.0–36.0)
MCV: 96.3 fL (ref 80.0–100.0)
Platelets: 144 10*3/uL — ABNORMAL LOW (ref 150–400)
RBC: 3.51 MIL/uL — ABNORMAL LOW (ref 3.87–5.11)
RDW: 17.1 % — ABNORMAL HIGH (ref 11.5–15.5)
WBC: 6.9 10*3/uL (ref 4.0–10.5)
nRBC: 0 % (ref 0.0–0.2)

## 2019-02-09 LAB — POCT I-STAT EG7
Acid-Base Excess: 2 mmol/L (ref 0.0–2.0)
Bicarbonate: 30.2 mmol/L — ABNORMAL HIGH (ref 20.0–28.0)
Calcium, Ion: 1 mmol/L — ABNORMAL LOW (ref 1.15–1.40)
HCT: 32 % — ABNORMAL LOW (ref 36.0–46.0)
Hemoglobin: 10.9 g/dL — ABNORMAL LOW (ref 12.0–15.0)
O2 Saturation: 81 %
Potassium: 3.9 mmol/L (ref 3.5–5.1)
Sodium: 135 mmol/L (ref 135–145)
TCO2: 32 mmol/L (ref 22–32)
pCO2, Ven: 62.7 mmHg — ABNORMAL HIGH (ref 44.0–60.0)
pH, Ven: 7.292 (ref 7.250–7.430)
pO2, Ven: 52 mmHg — ABNORMAL HIGH (ref 32.0–45.0)

## 2019-02-09 LAB — LACTATE DEHYDROGENASE: LDH: 267 U/L — ABNORMAL HIGH (ref 98–192)

## 2019-02-09 LAB — BRAIN NATRIURETIC PEPTIDE: B Natriuretic Peptide: 273 pg/mL — ABNORMAL HIGH (ref 0.0–100.0)

## 2019-02-09 LAB — FERRITIN: Ferritin: 1101 ng/mL — ABNORMAL HIGH (ref 11–307)

## 2019-02-09 LAB — D-DIMER, QUANTITATIVE: D-Dimer, Quant: 2.68 ug/mL-FEU — ABNORMAL HIGH (ref 0.00–0.50)

## 2019-02-09 LAB — PROCALCITONIN: Procalcitonin: 0.58 ng/mL

## 2019-02-09 LAB — TROPONIN I (HIGH SENSITIVITY)
Troponin I (High Sensitivity): 39 ng/L — ABNORMAL HIGH (ref <18)
Troponin I (High Sensitivity): 40 ng/L — ABNORMAL HIGH (ref <18)

## 2019-02-09 LAB — CBG MONITORING, ED: Glucose-Capillary: 87 mg/dL (ref 70–99)

## 2019-02-09 LAB — C-REACTIVE PROTEIN: CRP: 3.8 mg/dL — ABNORMAL HIGH (ref <1.0)

## 2019-02-09 LAB — ABO/RH: ABO/RH(D): O POS

## 2019-02-09 MED ORDER — CALCITRIOL 0.25 MCG PO CAPS
1.7500 ug | ORAL_CAPSULE | ORAL | Status: DC
  Administered 2019-02-11 – 2019-02-18 (×4): 1.75 ug via ORAL
  Filled 2019-02-09 (×5): qty 7

## 2019-02-09 MED ORDER — DOCUSATE SODIUM 100 MG PO CAPS
100.0000 mg | ORAL_CAPSULE | Freq: Every morning | ORAL | Status: DC
  Administered 2019-02-11 – 2019-02-22 (×12): 100 mg via ORAL
  Filled 2019-02-09 (×13): qty 1

## 2019-02-09 MED ORDER — MONTELUKAST SODIUM 10 MG PO TABS
10.0000 mg | ORAL_TABLET | Freq: Every day | ORAL | Status: DC
  Administered 2019-02-09 – 2019-02-22 (×13): 10 mg via ORAL
  Filled 2019-02-09 (×14): qty 1

## 2019-02-09 MED ORDER — DEXTROSE 5 % IV SOLN
0.5000 g | Freq: Two times a day (BID) | INTRAVENOUS | Status: DC
  Administered 2019-02-10 – 2019-02-12 (×6): 0.5 g via INTRAVENOUS
  Filled 2019-02-09 (×9): qty 0.5

## 2019-02-09 MED ORDER — SODIUM CHLORIDE 0.9 % IV SOLN
100.0000 mg | Freq: Every day | INTRAVENOUS | Status: AC
  Administered 2019-02-11 – 2019-02-14 (×4): 100 mg via INTRAVENOUS
  Filled 2019-02-09 (×5): qty 20

## 2019-02-09 MED ORDER — SEVELAMER CARBONATE 800 MG PO TABS
800.0000 mg | ORAL_TABLET | ORAL | Status: DC | PRN
  Filled 2019-02-09 (×2): qty 1

## 2019-02-09 MED ORDER — VANCOMYCIN HCL 2000 MG/400ML IV SOLN
2000.0000 mg | Freq: Once | INTRAVENOUS | Status: AC
  Administered 2019-02-10: 2000 mg via INTRAVENOUS
  Filled 2019-02-09: qty 400

## 2019-02-09 MED ORDER — FLUOXETINE HCL 10 MG PO CAPS
10.0000 mg | ORAL_CAPSULE | Freq: Every day | ORAL | Status: DC
  Administered 2019-02-11 – 2019-02-22 (×12): 10 mg via ORAL
  Filled 2019-02-09 (×14): qty 1

## 2019-02-09 MED ORDER — ACETAMINOPHEN 325 MG PO TABS
650.0000 mg | ORAL_TABLET | Freq: Four times a day (QID) | ORAL | Status: DC | PRN
  Administered 2019-02-09 – 2019-02-17 (×8): 650 mg via ORAL
  Filled 2019-02-09 (×8): qty 2

## 2019-02-09 MED ORDER — GUAIFENESIN-DM 100-10 MG/5ML PO SYRP
20.0000 mL | ORAL_SOLUTION | ORAL | Status: DC | PRN
  Administered 2019-02-14 – 2019-02-17 (×5): 20 mL via ORAL
  Filled 2019-02-09 (×6): qty 20

## 2019-02-09 MED ORDER — ZINC SULFATE 220 (50 ZN) MG PO CAPS
220.0000 mg | ORAL_CAPSULE | Freq: Every day | ORAL | Status: DC
  Administered 2019-02-11 – 2019-02-22 (×12): 220 mg via ORAL
  Filled 2019-02-09 (×15): qty 1

## 2019-02-09 MED ORDER — DEXAMETHASONE SODIUM PHOSPHATE 10 MG/ML IJ SOLN
6.0000 mg | Freq: Every day | INTRAMUSCULAR | Status: AC
  Administered 2019-02-09 – 2019-02-18 (×10): 6 mg via INTRAVENOUS
  Filled 2019-02-09 (×10): qty 1

## 2019-02-09 MED ORDER — CLONAZEPAM 0.5 MG PO TABS
0.5000 mg | ORAL_TABLET | Freq: Once | ORAL | Status: AC
  Administered 2019-02-09: 23:00:00 0.5 mg via ORAL
  Filled 2019-02-09: qty 1

## 2019-02-09 MED ORDER — ALBUTEROL SULFATE HFA 108 (90 BASE) MCG/ACT IN AERS: 2.0000 | INHALATION_SPRAY | Freq: Three times a day (TID) | RESPIRATORY_TRACT | Status: DC | PRN

## 2019-02-09 MED ORDER — SODIUM CHLORIDE 0.9 % IV SOLN
200.0000 mg | Freq: Once | INTRAVENOUS | Status: AC
  Administered 2019-02-10: 200 mg via INTRAVENOUS
  Filled 2019-02-09: qty 200

## 2019-02-09 MED ORDER — MIDODRINE HCL 5 MG PO TABS
10.0000 mg | ORAL_TABLET | ORAL | Status: DC
  Administered 2019-02-11: 10 mg via ORAL
  Filled 2019-02-09: qty 2

## 2019-02-09 MED ORDER — NEPRO/CARBSTEADY PO LIQD
237.0000 mL | Freq: Two times a day (BID) | ORAL | Status: DC
  Administered 2019-02-14 – 2019-02-22 (×8): 237 mL via ORAL
  Filled 2019-02-09 (×2): qty 237

## 2019-02-09 MED ORDER — HEPARIN SODIUM (PORCINE) 5000 UNIT/ML IJ SOLN
5000.0000 [IU] | Freq: Three times a day (TID) | INTRAMUSCULAR | Status: DC
  Administered 2019-02-10 – 2019-02-11 (×4): 5000 [IU] via SUBCUTANEOUS
  Filled 2019-02-09 (×3): qty 1

## 2019-02-09 MED ORDER — BENZONATATE 100 MG PO CAPS
200.0000 mg | ORAL_CAPSULE | Freq: Two times a day (BID) | ORAL | Status: DC | PRN
  Administered 2019-02-16 – 2019-02-18 (×4): 200 mg via ORAL
  Filled 2019-02-09 (×4): qty 2

## 2019-02-09 MED ORDER — SENNA 8.6 MG PO TABS
1.0000 | ORAL_TABLET | Freq: Every morning | ORAL | Status: DC
  Administered 2019-02-11 – 2019-02-22 (×12): 8.6 mg via ORAL
  Filled 2019-02-09 (×13): qty 1

## 2019-02-09 MED ORDER — SEVELAMER CARBONATE 800 MG PO TABS
1600.0000 mg | ORAL_TABLET | Freq: Three times a day (TID) | ORAL | Status: DC
  Administered 2019-02-11 – 2019-02-22 (×23): 1600 mg via ORAL
  Filled 2019-02-09 (×30): qty 2

## 2019-02-09 MED ORDER — INSULIN DETEMIR 100 UNIT/ML ~~LOC~~ SOLN
15.0000 [IU] | Freq: Every day | SUBCUTANEOUS | Status: DC
  Administered 2019-02-10 – 2019-02-13 (×4): 15 [IU] via SUBCUTANEOUS
  Filled 2019-02-09 (×5): qty 0.15

## 2019-02-09 MED ORDER — LEVOTHYROXINE SODIUM 112 MCG PO TABS: 112.0000 ug | ORAL_TABLET | Freq: Every day | ORAL | Status: DC

## 2019-02-09 MED ORDER — VANCOMYCIN HCL IN DEXTROSE 1-5 GM/200ML-% IV SOLN
1000.0000 mg | INTRAVENOUS | Status: DC
  Administered 2019-02-11: 1000 mg via INTRAVENOUS
  Filled 2019-02-09: qty 200

## 2019-02-09 MED ORDER — RENA-VITE PO TABS
1.0000 | ORAL_TABLET | Freq: Every day | ORAL | Status: DC
  Administered 2019-02-09 – 2019-02-22 (×13): 1 via ORAL
  Filled 2019-02-09 (×14): qty 1

## 2019-02-09 MED ORDER — CLONAZEPAM 0.5 MG PO TABS: 0.2500 mg | ORAL_TABLET | Freq: Two times a day (BID) | ORAL | Status: DC

## 2019-02-09 MED ORDER — ROSUVASTATIN CALCIUM 20 MG PO TABS
40.0000 mg | ORAL_TABLET | Freq: Every day | ORAL | Status: DC
  Administered 2019-02-10 – 2019-02-22 (×12): 40 mg via ORAL
  Filled 2019-02-09 (×4): qty 2
  Filled 2019-02-09: qty 8
  Filled 2019-02-09 (×2): qty 2
  Filled 2019-02-09: qty 8
  Filled 2019-02-09 (×2): qty 2
  Filled 2019-02-09: qty 8
  Filled 2019-02-09 (×2): qty 2

## 2019-02-09 MED ORDER — ASCORBIC ACID 500 MG PO TABS
500.0000 mg | ORAL_TABLET | Freq: Every day | ORAL | Status: DC
  Administered 2019-02-11 – 2019-02-22 (×12): 500 mg via ORAL
  Filled 2019-02-09 (×12): qty 1

## 2019-02-09 MED ORDER — INSULIN ASPART 100 UNIT/ML ~~LOC~~ SOLN
0.0000 [IU] | Freq: Three times a day (TID) | SUBCUTANEOUS | Status: DC
  Administered 2019-02-15 – 2019-02-18 (×3): 1 [IU] via SUBCUTANEOUS
  Administered 2019-02-20: 18:00:00 2 [IU] via SUBCUTANEOUS

## 2019-02-09 MED ORDER — OXYMETAZOLINE HCL 0.05 % NA SOLN
1.0000 | Freq: Two times a day (BID) | NASAL | Status: DC | PRN
  Administered 2019-02-19 – 2019-02-21 (×2): 1 via NASAL
  Filled 2019-02-09 (×2): qty 30

## 2019-02-09 MED ORDER — INSULIN ASPART 100 UNIT/ML ~~LOC~~ SOLN: 0.0000 [IU] | Freq: Every day | SUBCUTANEOUS | Status: DC

## 2019-02-09 NOTE — Progress Notes (Signed)
Pharmacy Antibiotic Note  Joan Mosley is a 74 y.o. female admitted on 02/09/2019 with pneumonia.  Pharmacy has been consulted for Vancomycin/Aztreonam dosing. WBC WNL. ESRD on HD TTS. COVID-19 positive.   Plan: Vancomycin 2000 mg IV x 1, then 1000 mg IV qHD TTS Aztreonam 500 mg IV q12h Trend WBC, temp, HD schedule F/U infectious work-up Drug levels as indicated   Temp (24hrs), Avg:99.1 F (37.3 C), Min:99.1 F (37.3 C), Max:99.1 F (37.3 C)  Recent Labs  Lab 02/07/19 1430 02/09/19 1946  WBC 6.5 6.9  CREATININE 6.84* 4.04*    CrCl cannot be calculated (Unknown ideal weight.).    Allergies  Allergen Reactions  . Avelox [Moxifloxacin Hcl In Nacl] Other (See Comments)    Unknown reaction  . Codeine Anaphylaxis  . Penicillins Rash    Did it involve swelling of the face/tongue/throat, SOB, or low BP? Unk Did it involve sudden or severe rash/hives, skin peeling, or any reaction on the inside of your mouth or nose? Yes Did you need to seek medical attention at a hospital or doctor's office? Unk When did it last happen? Unk If all above answers are "NO", may proceed with cephalosporin use.   . Sulfa Antibiotics Other (See Comments)    Unknown reaction  . Dextromethorphan-Guaifenesin Other (See Comments)    Reported by [facility] - unknown reaction  . Shellfish Allergy Hives, Itching and Rash   Narda Bonds, PharmD, BCPS Clinical Pharmacist Phone: [phone]

## 2019-02-09 NOTE — Progress Notes (Signed)
ED RT called d/t HFNC ordered for pt.  I confirmed w/ RT that a HFNC was ordered/requested as bipap order is in- per RT ED MD wants HFNC.  Found pt on NRB w/ sat 88%.   Placed on 20 lpm and 100% fio2, sat now 100%.  VSS currently.  Pt states HFNC is helping her breathing "feel better".

## 2019-02-09 NOTE — ED Triage Notes (Signed)
Pt here from dialysis / pt did finish her dialysis , pt sats 65% on 3 liters at dialysis and pt placed on 4 liters by ptar , sats up to 89 % on 4 liters on arrival . Pt is COVID  Positive

## 2019-02-09 NOTE — ED Notes (Signed)
[phone] pts son [name], wants an update

## 2019-02-09 NOTE — Telephone Encounter (Signed)
Received a message form [employer] [facility] stating they do not cover for levalbuterol (XOPENEX HFA) 45 MCG/ACT inhaler But they will cover for Albuterol Sulfate HFA, Ventolin HFA.   [name] please advise, PA or change it?

## 2019-02-09 NOTE — ED Provider Notes (Signed)
Joan Mosley   CSN: [id number] Arrival date & time: 02/09/19  1734     History No chief complaint on file.   Joan Mosley is a 74 y.o. female.  74 y.o female with a PMH of COPD,DM Oxygen dependent 5L on O2, presents to the ED via EMS from dialysis for hypoxia. According to EMS report patient was receiving treatment at dialysis, had 20 minutes left of her therapy when noted to be hypoxic.  She is currently on 5 L of O2 at baseline, dialysis she was only on 2 L.  Patient arrived in the ED and oxygen saturation of 82% on 2 L of O2.  She reports she hurts all over, unable to obtain much history from patient.  Level 5 caveat.  The history is provided by medical records and the patient.       Past Medical History:  Diagnosis Date  . Arthritis   . COPD (chronic obstructive pulmonary disease) ([place])   . Diabetes mellitus without complication ([place])   . Diabetic neuropathy ([place])   . 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 Renal disorder    ESRD  . Thyroid disease   . TIA (transient ischemic attack)     Patient Active Problem List   Diagnosis Date Noted  . Hypotension 12/24/2018  . Aspiration pneumonia of right lower lobe ([place])   . Palliative care encounter   . COPD exacerbation ([place]) 11/11/2018  . TIA (transient ischemic attack)   . HLD (hyperlipidemia)   . Type II diabetes mellitus with renal manifestations ([place])   . Depression with anxiety   . COPD with acute exacerbation ([place]) 09/30/2018  . Acute respiratory failure with hypoxia ([place]) 09/24/2018  . Advanced care planning/counseling discussion   . Goals of care, counseling/discussion   . Palliative care by specialist   . Acute on chronic respiratory failure with hypoxia and hypercapnia ([place]) 09/15/2018  . Pulmonary edema 09/08/2018  . Respiratory failure ([place]) 09/08/2018  . Weakness   . SDH (subdural hematoma) ([place]) 09/06/2018  . Chronic respiratory failure with  hypoxia, on home O2 therapy ([place]) 09/06/2018  . Subdural hematoma ([place]) 08/22/2018  . Elevated troponin 08/22/2018  . ESRD (end stage renal disease) ([place]) 08/22/2018  . Anxiety 07/04/2018  . Counseling regarding advanced directives and goals of care 07/01/2018  . Full code status 07/01/2018  . Drug-induced constipation 07/01/2018  . Hemodialysis-associated hypotension 07/01/2018  . Dialysis patient ([place]) 07/01/2018  . HOH (hard of hearing) 07/01/2018  . Stage 5 chronic kidney disease on chronic dialysis ([place]) 07/01/2018  . Type 2 diabetes mellitus with diabetic polyneuropathy, with long-term current use of insulin ([place]) 07/01/2018  . Hypothyroidism 07/01/2018  . Gait instability 07/01/2018  . Hyperkalemia 04/20/2018  . Cerebral infarction, unspecified ([place]) 04/05/2018  . Chronic diastolic (congestive) heart failure ([place]) 04/05/2018  . Diarrhea, unspecified 04/05/2018  . Pain, unspecified 04/05/2018  . Pruritus, unspecified 04/05/2018  . Shortness of breath 04/05/2018  . Coagulation defect, unspecified ([place]) 03/31/2018  . Anemia in chronic kidney disease 03/25/2018  . Iron deficiency anemia, unspecified 03/25/2018  . Secondary hyperparathyroidism of renal origin ([place]) 03/25/2018    Past Surgical History:  Procedure Laterality Date  . ABDOMINAL HYSTERECTOMY    . IR FLUORO GUIDE CV LINE RIGHT  11/14/2018  . RECTOCELE REPAIR    . REPLACEMENT TOTAL KNEE BILATERAL Bilateral 2008  . THYROIDECTOMY     80%  . VAGINAL WOUND CLOSURE / REPAIR  OB History   No obstetric history on file.     Family History  Problem Relation Age of Onset  . Heart disease Mother   . Hypertension Mother   . Heart disease Father   . Hypertension Father     Social History   Tobacco Use  . Smoking status: Former Smoker    Packs/day: 1.00    Years: 35.00    Pack years: 35.00    Quit date: 03/29/1981    Years since quitting: 37.8  . Smokeless tobacco: Never Used  Substance Use Topics  .  Alcohol use: Never  . Drug use: Never    Home Medications Prior to Admission medications   Medication Sig Start Date End Date Taking? Authorizing Provider  acetaminophen (TYLENOL) 500 MG tablet Take 1,000 mg by mouth every 6 (six) hours as needed (for arthritic pain).    Yes [provider]  benzonatate (TESSALON) 200 MG capsule Take 1 capsule (200 mg total) by mouth 2 (two) times daily as needed for cough. 02/08/19  Yes Nche, Charlene Brooke, NP  clonazePAM (KLONOPIN) 0.5 MG tablet 0.25mg  in AM and 0.5mg  in PM Patient taking differently: Take 0.25-0.5 mg by mouth 2 (two) times daily. 0.25mg  in AM and 0.5mg  in PM 02/08/19  Yes Nche, Charlene Brooke, NP  Darbepoetin Alfa (ARANESP) 25 MCG/0.42ML SOSY injection Inject 0.42 mLs (25 mcg total) into the vein every Tuesday with hemodialysis. 11/22/18  Yes Hosie Poisson, MD  docusate sodium (COLACE) 100 MG capsule Take 100 mg by mouth every morning.   Yes [provider]  FLUoxetine (PROZAC) 10 MG tablet Take 1 tablet (10 mg total) by mouth daily. 02/08/19  Yes Nche, Charlene Brooke, NP  furosemide (LASIX) 40 MG tablet Take 80 mg by mouth See admin instructions. Take 80 mg by mouth in the morning on Sun/Mon/Wed/Fri (non-dialysis days) 10/13/18  Yes [provider]  guaiFENesin-dextromethorphan (ROBITUSSIN DM) 100-10 MG/5ML syrup Take 20 mLs by mouth every 4 (four) hours as needed for cough.   Yes [provider]  insulin detemir (LEVEMIR) 100 UNIT/ML injection Inject 0.1 mLs (10 Units total) into the skin daily. Patient taking differently: Inject 15 Units into the skin daily before breakfast.  11/22/18  Yes Hosie Poisson, MD  insulin lispro (HUMALOG) 100 UNIT/ML injection Inject 1-2 Units into the skin See admin instructions. Inject 1-2 units into the skin three times a day before meals, per sliding scale: 1 unit for every 50 points above a BGL of 150   Yes [provider]  ipratropium-albuterol (DUONEB) 0.5-2.5 (3)  MG/3ML SOLN Take 3 mLs by nebulization See admin instructions. Nebulize and inhale 3 ml's into the lungs every four hours, while awake   Yes [provider]  levalbuterol (XOPENEX HFA) 45 MCG/ACT inhaler Inhale 2 puffs into the lungs every 8 (eight) hours as needed for wheezing or shortness of breath. 02/08/19  Yes Nche, Charlene Brooke, NP  levothyroxine (SYNTHROID) 112 MCG tablet Take 1 tablet (112 mcg total) by mouth daily before breakfast. 1 AM 02/08/19  Yes Nche, Charlene Brooke, NP  Lubricants (K-Y LUBRICANT JELLY SENSITIVE EX) Place 1 application into both nostrils as needed (as directed- for lubrication).    Yes [provider]  midodrine (PROAMATINE) 10 MG tablet Take 10 mg by mouth See admin instructions. Take 10 mg by mouth on Tuesday, Thursday, Saturday 45 minutes before dialysis   Yes [provider]  montelukast (SINGULAIR) 10 MG tablet Take 10 mg by mouth at bedtime.  Yes [provider]  multivitamin (RENA-VIT) TABS tablet Take 1 tablet by mouth at bedtime. 11/21/18  Yes Hosie Poisson, MD  NASAL SPRAY SALINE NA Place 1-2 sprays into both nostrils as needed (for congestion- "[employer] Simply Saline" brand).   Yes [provider]  Nutritional Supplements (FEEDING SUPPLEMENT, NEPRO CARB STEADY,) LIQD Take 237 mLs by mouth 2 (two) times daily between meals. 11/22/18  Yes Hosie Poisson, MD  nystatin (MYCOSTATIN/NYSTOP) powder Apply 1 g topically 2 (two) times daily as needed (as directed- to any rashes).    Yes [provider]  nystatin cream (MYCOSTATIN) Apply 1 application topically 2 (two) times daily as needed for dry skin.    Yes [provider]  OVER THE COUNTER MEDICATION Stool softner otc   Yes [provider]  OXYGEN Inhale 5-6 L/min into the lungs continuous.   Yes [provider]  oxymetazoline (AFRIN) 0.05 % nasal spray Place 1 spray into both nostrils 2 (two) times daily as needed for congestion.    Yes [provider]  rosuvastatin (CRESTOR) 40 MG tablet Take 1 tablet (40 mg total) by mouth at bedtime. 02/08/19  Yes Nche, Charlene Brooke, NP  senna (SENOKOT) 8.6 MG TABS tablet Take 1 tablet by mouth every morning.   Yes [provider]  sevelamer carbonate (RENVELA) 800 MG tablet Take 2 tablets (1,600 mg total) by mouth 3 (three) times daily with meals. Patient taking differently: Take 800-1,600 mg by mouth See admin instructions. Take 1,600 mg by mouth three times a day with meals and 800-1,600 mg with each snack 11/21/18  Yes Hosie Poisson, MD  albuterol (PROVENTIL) (2.5 MG/3ML) 0.083% nebulizer solution Take 3 mLs (2.5 mg total) by nebulization 4 (four) times daily. Patient not taking: Reported on 02/09/2019 09/16/18   Mariel Aloe, MD  calcitRIOL (ROCALTROL) 0.25 MCG capsule Take 7 capsules (1.75 mcg total) by mouth Every Tuesday,Thursday,and Saturday with dialysis. 11/22/18   Hosie Poisson, MD  cinacalcet (SENSIPAR) 30 MG tablet Take 2 tablets (60 mg total) by mouth Every Tuesday,Thursday,and Saturday with dialysis. Patient not taking: Reported on 02/09/2019 11/22/18   Hosie Poisson, MD  lidocaine (LIDODERM) 5 % Place 1 patch onto the skin every 12 (twelve) hours as needed (back pain). Remove & Discard patch within 12 hours or as directed by MD     [provider]    Allergies    Avelox [moxifloxacin hcl in nacl], Codeine, Penicillins, Sulfa antibiotics, Dextromethorphan-guaifenesin, and Shellfish allergy  Review of Systems   Review of Systems  Unable to perform ROS: Acuity of condition    Physical Exam Updated Vital Signs BP 119/71   Pulse (!) 112   Temp 99.1 F (37.3 C) (Oral)   Resp (!) 26   SpO2 100%   Physical Exam Vitals and nursing Mosley reviewed.  Constitutional:      Appearance: She is ill-appearing and toxic-appearing.  HENT:     Head: Normocephalic and atraumatic.     Mouth/Throat:     Mouth: Mucous membranes are dry.  Eyes:      Pupils: Pupils are equal, round, and reactive to light.  Cardiovascular:     Rate and Rhythm: Tachycardia present.  Pulmonary:     Breath sounds: Rhonchi and rales present.     Comments: Tachypnea. Abdominal:     General: Abdomen is flat.     Palpations: Abdomen is soft.     Tenderness: There is no abdominal tenderness.  Musculoskeletal:  Cervical back: Normal range of motion and neck supple.     Right lower leg: No edema.     Left lower leg: No edema.  Skin:    General: Skin is warm and dry.     Coloration: Skin is not jaundiced or pale.     Findings: No bruising or erythema.  Neurological:     Mental Status: Mental status is at baseline.     ED Results / Procedures / Treatments   Labs (all labs ordered are listed, but only abnormal results are displayed) Labs Reviewed  CBC - Abnormal; Notable for the following components:      Result Value   RBC 3.51 (*)    Hemoglobin 10.1 (*)    HCT 33.8 (*)    MCHC 29.9 (*)    RDW 17.1 (*)    Platelets 144 (*)    All other components within normal limits  COMPREHENSIVE METABOLIC PANEL - Abnormal; Notable for the following components:   Chloride 92 (*)    Glucose, Bld 105 (*)    Creatinine, Ser 4.04 (*)    Calcium 8.1 (*)    Total Protein 5.5 (*)    Albumin 2.7 (*)    Total Bilirubin 1.3 (*)    GFR calc non Af Amer 10 (*)    GFR calc Af Amer 12 (*)    Anion gap 18 (*)    All other components within normal limits  BRAIN NATRIURETIC PEPTIDE - Abnormal; Notable for the following components:   B Natriuretic Peptide 273.0 (*)    All other components within normal limits  C-REACTIVE PROTEIN - Abnormal; Notable for the following components:   CRP 3.8 (*)    All other components within normal limits  D-DIMER, QUANTITATIVE (NOT AT [facility]) - Abnormal; Notable for the following components:   D-Dimer, Quant 2.68 (*)    All other components within normal limits  FERRITIN - Abnormal; Notable for the following components:   Ferritin  1,101 (*)    All other components within normal limits  LACTATE DEHYDROGENASE - Abnormal; Notable for the following components:   LDH 267 (*)    All other components within normal limits  POCT I-STAT EG7 - Abnormal; Notable for the following components:   pCO2, Ven 62.7 (*)    pO2, Ven 52.0 (*)    Bicarbonate 30.2 (*)    Calcium, Ion 1.00 (*)    HCT 32.0 (*)    Hemoglobin 10.9 (*)    All other components within normal limits  TROPONIN I (HIGH SENSITIVITY) - Abnormal; Notable for the following components:   Troponin I (High Sensitivity) 39 (*)    All other components within normal limits  PROCALCITONIN  I-STAT VENOUS BLOOD GAS, ED  ABO/RH  TROPONIN I (HIGH SENSITIVITY)    EKG EKG Interpretation  Date/Time:  Thursday February 09 2019 17:46:32 EST Ventricular Rate:  123 PR Interval:  152 QRS Duration: 78 QT Interval:  332 QTC Calculation: 475 R Axis:   -74 Text Interpretation: Sinus tachycardia Low voltage QRS Left anterior fascicular block Possible Anterolateral infarct , age undetermined Abnormal ECG Since last tracing rate faster Confirmed by Isla Pence [phone]) on 02/09/2019 6:28:37 PM   Radiology DG Chest Portable 1 View  Result Date: 02/09/2019 CLINICAL DATA:  74 year old female positive COVID-19. Shortness of breath. EXAM: PORTABLE CHEST 1 VIEW COMPARISON:  Portable chest 02/07/2019.  Chest CT 11/16/2018. FINDINGS: Portable AP semi upright view at 1843 hours. Stable right chest dialysis type catheter. Stable  lung volumes and mediastinal contours. Asymmetric increased interstitial opacity is now most pronounced at the left lung base. There is also increased streaky medial right lung base opacity. No pneumothorax. No pleural effusion is evident. No acute osseous abnormality identified. IMPRESSION: Asymmetric pulmonary opacity compatible with COVID-19 pneumonia in this setting. Mild progression in both lower lungs since 02/07/2019. Electronically Signed   By: Genevie Ann M.D.    On: 02/09/2019 19:36    Procedures .Critical Care Performed by: Janeece Fitting, PA-C Authorized by: Janeece Fitting, PA-C   Critical care provider statement:    Critical care time (minutes):  40   Critical care start time:  02/09/2019 5:50 PM   Critical care end time:  02/09/2019 6:30 PM   Critical care time was exclusive of:  Separately billable procedures and treating other patients   Critical care was necessary to treat or prevent imminent or life-threatening deterioration of the following conditions:  Respiratory failure   Critical care was time spent personally by me on the following activities:  Blood draw for specimens, development of treatment plan with patient or surrogate, discussions with consultants, evaluation of patient's response to treatment, examination of patient, obtaining history from patient or surrogate, ordering and performing treatments and interventions, ordering and review of laboratory studies, ordering and review of radiographic studies, pulse oximetry, re-evaluation of patient's condition and review of old charts   (including critical care time)  Medications Ordered in ED Medications - No data to display  ED Course  I have reviewed the triage vital signs and the nursing notes.  Pertinent labs & imaging results that were available during my care of the patient were reviewed by me and considered in my medical decision making (see chart for details).  Clinical Course as of Feb 08 2202  Thu Feb 09, 2019  1848 2L  SpO2(!): 89 % [JS]    Clinical Course User Index [JS] Janeece Fitting, PA-C   MDM Rules/Calculators/A&P   Chronically ill-appearing with history of ESRD, Covid +2 days ago presents to the ED from dialysis center for hypoxia.  Patient was satting at 82% on 2 L of O2.  According to records she is supposed to be on 5 L of O2 at her baseline.  Patient reports she hurts all over.  She was seen in the ED 2 days ago for the same complaint, missed dialysis  therefore was dialyzed yesterday and today.  6:42 PM Spoke to Hershal Coria, who lives in [place], he also is the power of attorney.  To him patient was seen 2 days ago in the ED, pneumonia was absent on her chest x-ray.  She was supposed to televisit.  However, failed to do so.  Per [name] son, patient is a DNR/DNI  X-ray of her chest showed worsening infiltrates since 2 days ago: Asymmetric pulmonary opacity compatible with COVID-19 pneumonia in  this setting. Mild progression in both lower lungs since 02/07/2019.     Found pt on NRB w/ sat 88%.   Placed on 20 lpm and 100% fio2, sat now 100%.  Patient was placed on high flow by RT.  Venous gas with a normal pH.  BNP at 273, does have a history of heart failure.  CMP without any electrolyte abnormality although decrease in chloride.  Creatinine level is elevated at 4.04, patient did get dialyzed today however left dialysis with 20 minutes left.  LFTs are unremarkable.  CBC without any leukocytosis, hemoglobin is at her baseline.  D-dimer is 2.68.  C-reactive protein 3.8, ferritin level is 1, 101.  LDH is 267.  Troponin is 39, suspect likely from ESRD.  Patient is maintaining her oxygen via high flow.  Will now place call for hospitalist admission.  Spoke to Dr. Marlowe Sax who will admit patient for further management.   Portions of this Mosley were generated with [employer]. Dictation errors may occur despite best attempts at proofreading.  Final Clinical Impression(s) / ED Diagnoses Final diagnoses:  COVID-19 virus infection  Shortness of breath    Rx / DC Orders ED Discharge Orders    None       Corinna Capra 02/09/19 2204    Isla Pence, MD 02/09/19 2213

## 2019-02-09 NOTE — Progress Notes (Addendum)
NRB removed at this time, pt continues on HHFNC 20L/90%. Pt with audible wheeze noted. Pt states her breathing feels okay only on HHFNC. RT will continue to monitor.

## 2019-02-10 ENCOUNTER — Encounter (HOSPITAL_COMMUNITY): Payer: Self-pay | Admitting: Internal Medicine

## 2019-02-10 LAB — CBC WITH DIFFERENTIAL/PLATELET
Abs Immature Granulocytes: 0.05 10*3/uL (ref 0.00–0.07)
Basophils Absolute: 0 10*3/uL (ref 0.0–0.1)
Basophils Relative: 0 %
Eosinophils Absolute: 0 10*3/uL (ref 0.0–0.5)
Eosinophils Relative: 0 %
HCT: 32.1 % — ABNORMAL LOW (ref 36.0–46.0)
Hemoglobin: 9.8 g/dL — ABNORMAL LOW (ref 12.0–15.0)
Immature Granulocytes: 1 %
Lymphocytes Relative: 6 %
Lymphs Abs: 0.4 10*3/uL — ABNORMAL LOW (ref 0.7–4.0)
MCH: 29.6 pg (ref 26.0–34.0)
MCHC: 30.5 g/dL (ref 30.0–36.0)
MCV: 97 fL (ref 80.0–100.0)
Monocytes Absolute: 0.3 10*3/uL (ref 0.1–1.0)
Monocytes Relative: 4 %
Neutro Abs: 6.3 10*3/uL (ref 1.7–7.7)
Neutrophils Relative %: 89 %
Platelets: 132 10*3/uL — ABNORMAL LOW (ref 150–400)
RBC: 3.31 MIL/uL — ABNORMAL LOW (ref 3.87–5.11)
RDW: 17 % — ABNORMAL HIGH (ref 11.5–15.5)
WBC: 7.1 10*3/uL (ref 4.0–10.5)
nRBC: 0 % (ref 0.0–0.2)

## 2019-02-10 LAB — C-REACTIVE PROTEIN: CRP: 6.4 mg/dL — ABNORMAL HIGH (ref <1.0)

## 2019-02-10 LAB — COMPREHENSIVE METABOLIC PANEL
ALT: 13 U/L (ref 0–44)
AST: 18 U/L (ref 15–41)
Albumin: 2.6 g/dL — ABNORMAL LOW (ref 3.5–5.0)
Alkaline Phosphatase: 77 U/L (ref 38–126)
Anion gap: 16 — ABNORMAL HIGH (ref 5–15)
BUN: 18 mg/dL (ref 8–23)
CO2: 25 mmol/L (ref 22–32)
Calcium: 8.3 mg/dL — ABNORMAL LOW (ref 8.9–10.3)
Chloride: 94 mmol/L — ABNORMAL LOW (ref 98–111)
Creatinine, Ser: 4.36 mg/dL — ABNORMAL HIGH (ref 0.44–1.00)
GFR calc Af Amer: 11 mL/min — ABNORMAL LOW (ref >60)
GFR calc non Af Amer: 9 mL/min — ABNORMAL LOW (ref >60)
Glucose, Bld: 119 mg/dL — ABNORMAL HIGH (ref 70–99)
Potassium: 4.4 mmol/L (ref 3.5–5.1)
Sodium: 135 mmol/L (ref 135–145)
Total Bilirubin: 1.4 mg/dL — ABNORMAL HIGH (ref 0.3–1.2)
Total Protein: 5.3 g/dL — ABNORMAL LOW (ref 6.5–8.1)

## 2019-02-10 LAB — POCT I-STAT 7, (LYTES, BLD GAS, ICA,H+H)
Acid-Base Excess: 1 mmol/L (ref 0.0–2.0)
Bicarbonate: 26.7 mmol/L (ref 20.0–28.0)
Calcium, Ion: 1.12 mmol/L — ABNORMAL LOW (ref 1.15–1.40)
HCT: 29 % — ABNORMAL LOW (ref 36.0–46.0)
Hemoglobin: 9.9 g/dL — ABNORMAL LOW (ref 12.0–15.0)
O2 Saturation: 95 %
Patient temperature: 99.5
Potassium: 5 mmol/L (ref 3.5–5.1)
Sodium: 132 mmol/L — ABNORMAL LOW (ref 135–145)
TCO2: 28 mmol/L (ref 22–32)
pCO2 arterial: 47.1 mmHg (ref 32.0–48.0)
pH, Arterial: 7.364 (ref 7.350–7.450)
pO2, Arterial: 79 mmHg — ABNORMAL LOW (ref 83.0–108.0)

## 2019-02-10 LAB — CBG MONITORING, ED
Glucose-Capillary: 134 mg/dL — ABNORMAL HIGH (ref 70–99)
Glucose-Capillary: 93 mg/dL (ref 70–99)
Glucose-Capillary: 96 mg/dL (ref 70–99)
Glucose-Capillary: 107 mg/dL — ABNORMAL HIGH (ref 70–99)
Glucose-Capillary: 183 mg/dL — ABNORMAL HIGH (ref 70–99)

## 2019-02-10 LAB — PROCALCITONIN: Procalcitonin: 0.62 ng/mL

## 2019-02-10 LAB — D-DIMER, QUANTITATIVE: D-Dimer, Quant: 1.92 ug/mL-FEU — ABNORMAL HIGH (ref 0.00–0.50)

## 2019-02-10 LAB — LACTATE DEHYDROGENASE: LDH: 239 U/L — ABNORMAL HIGH (ref 98–192)

## 2019-02-10 MED ORDER — LEVOTHYROXINE SODIUM 100 MCG/5ML IV SOLN
56.0000 ug | Freq: Every day | INTRAVENOUS | Status: DC
  Administered 2019-02-10 – 2019-02-11 (×2): 56 ug via INTRAVENOUS
  Filled 2019-02-10 (×2): qty 5

## 2019-02-10 MED ORDER — CHLORHEXIDINE GLUCONATE CLOTH 2 % EX PADS
6.0000 | MEDICATED_PAD | Freq: Every day | CUTANEOUS | Status: DC
  Administered 2019-02-11 – 2019-02-22 (×12): 6 via TOPICAL

## 2019-02-10 MED ORDER — ALBUTEROL SULFATE HFA 108 (90 BASE) MCG/ACT IN AERS: 2.0000 | INHALATION_SPRAY | Freq: Four times a day (QID) | RESPIRATORY_TRACT | 2 refills | Status: DC | PRN

## 2019-02-10 MED ORDER — CLONAZEPAM 0.25 MG PO TBDP
0.2500 mg | ORAL_TABLET | Freq: Every day | ORAL | Status: DC
  Administered 2019-02-11 – 2019-02-22 (×12): 0.25 mg via ORAL
  Filled 2019-02-10 (×2): qty 1
  Filled 2019-02-10 (×2): qty 2
  Filled 2019-02-10 (×4): qty 1
  Filled 2019-02-10: qty 2
  Filled 2019-02-10 (×2): qty 1
  Filled 2019-02-10: qty 2
  Filled 2019-02-10: qty 1

## 2019-02-10 MED ORDER — IPRATROPIUM-ALBUTEROL 20-100 MCG/ACT IN AERS
2.0000 | INHALATION_SPRAY | Freq: Four times a day (QID) | RESPIRATORY_TRACT | Status: DC
  Administered 2019-02-10 – 2019-02-11 (×8): 2 via RESPIRATORY_TRACT
  Filled 2019-02-10: qty 4

## 2019-02-10 MED ORDER — CLONAZEPAM 0.5 MG PO TABS
0.5000 mg | ORAL_TABLET | Freq: Every day | ORAL | Status: DC
  Administered 2019-02-10 – 2019-02-11 (×2): 0.5 mg via ORAL
  Filled 2019-02-10 (×2): qty 1

## 2019-02-10 NOTE — Progress Notes (Signed)
Increased FiO2 to 100% D/T low SpO2

## 2019-02-10 NOTE — ED Notes (Signed)
Called and spoke with son [name] update given

## 2019-02-10 NOTE — ED Notes (Signed)
Please call son [name] @ [phone] for a status update--Joan Mosley

## 2019-02-10 NOTE — H&P (Signed)
History and Physical    Markeisha Beaubien [id number] DOB: [birth year] DOA: 02/09/2019  PCP: Flossie Buffy, NP Patient coming from: Dialysis center  Chief Complaint: Hypoxia  HPI: Kennon Bebeau is a 74 y.o. female with medical history significant of ESRD on HD, COPD, chronic respiratory failure on 5 L home oxygen, type 2 diabetes, TIA, hypothyroidism presenting to the ED from her dialysis center for evaluation of hypoxia.  Patient tested positive for COVID-19 on 1/12.  Dialysis today she had 20 minutes left of her therapy when she was noted to be hypoxic.  Oxygen saturation 82% on 2 L oxygen.  Patient is AAO x3.  States she recently tested positive for COVID-19.  Her only complaints are generalized weakness/fatigue, chills, and body aches.  Denies chest pain, shortness of breath, cough, nausea, vomiting, abdominal pain, or diarrhea.  States she goes for dialysis every Tuesday, Thursday, and Saturday.  She was brought here from her dialysis center.  No additional history could be obtained from her.  ED Course: Afebrile.  Tachycardic and tachypneic.  Oxygen saturation 88% on nonrebreather.  She was switched to 20 L oxygen via high flow nasal cannula and oxygen saturation improved.  Labs showing no leukocytosis.  Hemoglobin 10.1, at baseline.  BNP 273.  Procalcitonin 0.58.  Transaminases normal.  Inflammatory markers elevated: CRP 3.8, D-dimer 2.68, ferritin 1101, and LDH 267.  High-sensitivity troponin 39 >40.  EKG without acute ischemic changes.  Chest x-ray personally reviewed showing asymmetric pulmonary opacities compatible with COVID-19 pneumonia, mildly progressed in lower lungs compared to prior chest x-ray from 1/12.  Review of Systems:  All systems reviewed and apart from history of presenting illness, are negative.  Past Medical History:  Diagnosis Date  . Arthritis   . COPD (chronic obstructive pulmonary disease) ([place])   . Diabetes mellitus without complication ([place])   . Diabetic  neuropathy ([place])   . 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 Renal disorder    ESRD  . Thyroid disease   . TIA (transient ischemic attack)     Past Surgical History:  Procedure Laterality Date  . ABDOMINAL HYSTERECTOMY    . IR FLUORO GUIDE CV LINE RIGHT  11/14/2018  . RECTOCELE REPAIR    . REPLACEMENT TOTAL KNEE BILATERAL Bilateral 2008  . THYROIDECTOMY     80%  . VAGINAL WOUND CLOSURE / REPAIR       reports that she quit smoking about 37 years ago. She has a 35.00 pack-year smoking history. She has never used smokeless tobacco. She reports that she does not drink alcohol or use drugs.  Allergies  Allergen Reactions  . Avelox [Moxifloxacin Hcl In Nacl] Other (See Comments)    Unknown reaction  . Codeine Anaphylaxis  . Penicillins Rash    Did it involve swelling of the face/tongue/throat, SOB, or low BP? Unk Did it involve sudden or severe rash/hives, skin peeling, or any reaction on the inside of your mouth or nose? Yes Did you need to seek medical attention at a hospital or doctor's office? Unk When did it last happen? Unk If all above answers are "NO", may proceed with cephalosporin use.   . Sulfa Antibiotics Other (See Comments)    Unknown reaction  . Dextromethorphan-Guaifenesin Other (See Comments)    Reported by [facility] - unknown reaction  . Shellfish Allergy Hives, Itching and Rash    Family History  Problem Relation Age of Onset  . Heart disease Mother   . Hypertension Mother   .  Heart disease Father   . Hypertension Father     Prior to Admission medications   Medication Sig Start Date End Date Taking? Authorizing Provider  acetaminophen (TYLENOL) 500 MG tablet Take 1,000 mg by mouth every 6 (six) hours as needed (for arthritic pain).    Yes [provider]  benzonatate (TESSALON) 200 MG capsule Take 1 capsule (200 mg total) by mouth 2 (two) times daily as needed for cough. 02/08/19  Yes Nche, Charlene Brooke, NP  clonazePAM (KLONOPIN) 0.5 MG  tablet 0.25mg  in AM and 0.5mg  in PM Patient taking differently: Take 0.25-0.5 mg by mouth 2 (two) times daily. 0.25mg  in AM and 0.5mg  in PM 02/08/19  Yes Nche, Charlene Brooke, NP  Darbepoetin Alfa (ARANESP) 25 MCG/0.42ML SOSY injection Inject 0.42 mLs (25 mcg total) into the vein every Tuesday with hemodialysis. 11/22/18  Yes Hosie Poisson, MD  docusate sodium (COLACE) 100 MG capsule Take 100 mg by mouth every morning.   Yes [provider]  FLUoxetine (PROZAC) 10 MG tablet Take 1 tablet (10 mg total) by mouth daily. 02/08/19  Yes Nche, Charlene Brooke, NP  furosemide (LASIX) 40 MG tablet Take 80 mg by mouth See admin instructions. Take 80 mg by mouth in the morning on Sun/Mon/Wed/Fri (non-dialysis days) 10/13/18  Yes [provider]  guaiFENesin-dextromethorphan (ROBITUSSIN DM) 100-10 MG/5ML syrup Take 20 mLs by mouth every 4 (four) hours as needed for cough.   Yes [provider]  insulin detemir (LEVEMIR) 100 UNIT/ML injection Inject 0.1 mLs (10 Units total) into the skin daily. Patient taking differently: Inject 15 Units into the skin daily before breakfast.  11/22/18  Yes Hosie Poisson, MD  insulin lispro (HUMALOG) 100 UNIT/ML injection Inject 1-2 Units into the skin See admin instructions. Inject 1-2 units into the skin three times a day before meals, per sliding scale: 1 unit for every 50 points above a BGL of 150   Yes [provider]  ipratropium-albuterol (DUONEB) 0.5-2.5 (3) MG/3ML SOLN Take 3 mLs by nebulization See admin instructions. Nebulize and inhale 3 ml's into the lungs every four hours, while awake   Yes [provider]  levalbuterol (XOPENEX HFA) 45 MCG/ACT inhaler Inhale 2 puffs into the lungs every 8 (eight) hours as needed for wheezing or shortness of breath. 02/08/19  Yes Nche, Charlene Brooke, NP  levothyroxine (SYNTHROID) 112 MCG tablet Take 1 tablet (112 mcg total) by mouth daily before breakfast. 1 AM 02/08/19  Yes Nche, Charlene Brooke, NP    Lubricants (K-Y LUBRICANT JELLY SENSITIVE EX) Place 1 application into both nostrils as needed (as directed- for lubrication).    Yes [provider]  midodrine (PROAMATINE) 10 MG tablet Take 10 mg by mouth See admin instructions. Take 10 mg by mouth on Tuesday, Thursday, Saturday 45 minutes before dialysis   Yes [provider]  montelukast (SINGULAIR) 10 MG tablet Take 10 mg by mouth at bedtime.    Yes [provider]  multivitamin (RENA-VIT) TABS tablet Take 1 tablet by mouth at bedtime. 11/21/18  Yes Hosie Poisson, MD  NASAL SPRAY SALINE NA Place 1-2 sprays into both nostrils as needed (for congestion- "[employer] Simply Saline" brand).   Yes [provider]  Nutritional Supplements (FEEDING SUPPLEMENT, NEPRO CARB STEADY,) LIQD Take 237 mLs by mouth 2 (two) times daily between meals. 11/22/18  Yes Hosie Poisson, MD  nystatin (MYCOSTATIN/NYSTOP) powder Apply 1 g topically 2 (two) times daily as needed (as directed- to any rashes).  Yes [provider]  nystatin cream (MYCOSTATIN) Apply 1 application topically 2 (two) times daily as needed for dry skin.    Yes [provider]  OVER THE COUNTER MEDICATION Stool softner otc   Yes [provider]  OXYGEN Inhale 5-6 L/min into the lungs continuous.   Yes [provider]  oxymetazoline (AFRIN) 0.05 % nasal spray Place 1 spray into both nostrils 2 (two) times daily as needed for congestion.   Yes [provider]  rosuvastatin (CRESTOR) 40 MG tablet Take 1 tablet (40 mg total) by mouth at bedtime. 02/08/19  Yes Nche, Charlene Brooke, NP  senna (SENOKOT) 8.6 MG TABS tablet Take 1 tablet by mouth every morning.   Yes [provider]  sevelamer carbonate (RENVELA) 800 MG tablet Take 2 tablets (1,600 mg total) by mouth 3 (three) times daily with meals. Patient taking differently: Take 800-1,600 mg by mouth See admin instructions. Take 1,600 mg by mouth three times a  day with meals and 800-1,600 mg with each snack 11/21/18  Yes Hosie Poisson, MD  albuterol (PROVENTIL) (2.5 MG/3ML) 0.083% nebulizer solution Take 3 mLs (2.5 mg total) by nebulization 4 (four) times daily. Patient not taking: Reported on 02/09/2019 09/16/18   Mariel Aloe, MD  calcitRIOL (ROCALTROL) 0.25 MCG capsule Take 7 capsules (1.75 mcg total) by mouth Every Tuesday,Thursday,and Saturday with dialysis. 11/22/18   Hosie Poisson, MD  cinacalcet (SENSIPAR) 30 MG tablet Take 2 tablets (60 mg total) by mouth Every Tuesday,Thursday,and Saturday with dialysis. Patient not taking: Reported on 02/09/2019 11/22/18   Hosie Poisson, MD  lidocaine (LIDODERM) 5 % Place 1 patch onto the skin every 12 (twelve) hours as needed (back pain). Remove & Discard patch within 12 hours or as directed by MD     [provider]    Physical Exam: Vitals:   02/10/19 0100 02/10/19 0200 02/10/19 0206 02/10/19 0230  BP: 110/60 104/87  (!) 145/132  Pulse:  (!) 101  (!) 102  Resp:  19  20  Temp:   99.5 F (37.5 C)   TempSrc:   Oral   SpO2: 91% 93%  90%    Physical Exam  Constitutional: She is oriented to person, place, and time. She appears well-developed and well-nourished.  HENT:  Head: Normocephalic.  Eyes: Right eye exhibits no discharge. Left eye exhibits no discharge.  Cardiovascular: Normal rate, regular rhythm and intact distal pulses.  Pulmonary/Chest: She has no wheezes.  Tachypneic Coarse breath sounds bilaterally On 20 L supplemental oxygen via high flow nasal cannula  Abdominal: Soft. Bowel sounds are normal. She exhibits no distension. There is no abdominal tenderness. There is no guarding.  Musculoskeletal:        General: No edema.     Cervical back: Neck supple.  Neurological: She is alert and oriented to person, place, and time.  Skin: Skin is warm and dry. She is not diaphoretic.     Labs on Admission: I have personally reviewed following labs and imaging  studies  CBC: Recent Labs  Lab 02/07/19 1430 02/09/19 1946 02/09/19 1957  WBC 6.5 6.9  --   NEUTROABS 4.2  --   --   HGB 10.1* 10.1* 10.9*  HCT 33.2* 33.8* 32.0*  MCV 97.4 96.3  --   PLT 176 144*  --    Basic Metabolic Panel: Recent Labs  Lab 02/07/19 1430 02/09/19 1946 02/09/19 1957  NA 132* 136 135  K 4.9 4.1 3.9  CL 91* 92*  --  CO2 27 26  --   GLUCOSE 78 105*  --   BUN 41* 15  --   CREATININE 6.84* 4.04*  --   CALCIUM 8.2* 8.1*  --    GFR: CrCl cannot be calculated (Unknown ideal weight.). Liver Function Tests: Recent Labs  Lab 02/09/19 1946  AST 24  ALT 13  ALKPHOS 86  BILITOT 1.3*  PROT 5.5*  ALBUMIN 2.7*   No results for input(s): LIPASE, AMYLASE in the last 168 hours. No results for input(s): AMMONIA in the last 168 hours. Coagulation Profile: No results for input(s): INR, PROTIME in the last 168 hours. Cardiac Enzymes: No results for input(s): CKTOTAL, CKMB, CKMBINDEX, TROPONINI in the last 168 hours. BNP (last 3 results) No results for input(s): PROBNP in the last 8760 hours. HbA1C: No results for input(s): HGBA1C in the last 72 hours. CBG: Recent Labs  Lab 02/09/19 2321  GLUCAP 87   Lipid Profile: No results for input(s): CHOL, HDL, LDLCALC, TRIG, CHOLHDL, LDLDIRECT in the last 72 hours. Thyroid Function Tests: No results for input(s): TSH, T4TOTAL, FREET4, T3FREE, THYROIDAB in the last 72 hours. Anemia Panel: Recent Labs    02/09/19 1946  FERRITIN 1,101*   Urine analysis:    Component Value Date/Time   COLORURINE YELLOW 04/20/2018 0301   APPEARANCEUR HAZY (A) 04/20/2018 0301   LABSPEC 1.009 04/20/2018 0301   PHURINE 9.0 (H) 04/20/2018 0301   GLUCOSEU 150 (A) 04/20/2018 0301   HGBUR MODERATE (A) 04/20/2018 0301   BILIRUBINUR NEGATIVE 04/20/2018 0301   KETONESUR NEGATIVE 04/20/2018 0301   PROTEINUR 100 (A) 04/20/2018 0301   NITRITE NEGATIVE 04/20/2018 0301   LEUKOCYTESUR NEGATIVE 04/20/2018 0301    Radiological Exams on  Admission: DG Chest Portable 1 View  Result Date: 02/09/2019 CLINICAL DATA:  74 year old female positive COVID-19. Shortness of breath. EXAM: PORTABLE CHEST 1 VIEW COMPARISON:  Portable chest 02/07/2019.  Chest CT 11/16/2018. FINDINGS: Portable AP semi upright view at 1843 hours. Stable right chest dialysis type catheter. Stable lung volumes and mediastinal contours. Asymmetric increased interstitial opacity is now most pronounced at the left lung base. There is also increased streaky medial right lung base opacity. No pneumothorax. No pleural effusion is evident. No acute osseous abnormality identified. IMPRESSION: Asymmetric pulmonary opacity compatible with COVID-19 pneumonia in this setting. Mild progression in both lower lungs since 02/07/2019. Electronically Signed   By: Genevie Ann M.D.   On: 02/09/2019 19:36    EKG: Independently reviewed.  Sinus tachycardia, heart rate 123. LAFB.  Rate increased since prior tracing.  Assessment/Plan Principal Problem:   Pneumonia due to COVID-19 virus Active Problems:   Type 2 diabetes mellitus with diabetic polyneuropathy, with long-term current use of insulin ([facility])   ESRD (end stage renal disease) ([facility])   Acute on chronic respiratory failure with hypoxia ([facility])   COPD (chronic obstructive pulmonary disease) ([facility])   Acute on chronic hypoxic respiratory failure secondary to COVID-19 multifocal pneumonia Patient tested positive for COVID-19 on 1/12.  On 5 L home oxygen at baseline.  Oxygen saturation 88% on nonrebreather.  She was switched to 20 L oxygen via high flow nasal cannula and oxygen saturation improved.  Inflammatory markers elevated: CRP 3.8, D-dimer 2.68, ferritin 1101, and LDH 267. Chest x-ray personally reviewed showing asymmetric pulmonary opacities compatible with COVID-19 pneumonia, mildly progressed in lower lungs compared to prior chest x-ray from 1/12. -Remdesivir dosing per pharmacy -IV Decadron 6 mg daily -Low suspicion for bacterial  pneumonia is low, procalcitonin level elevated at 0.58.  Will treat with antibiotics at this time and trend procalcitonin daily.  Aztreonam and vancomycin ordered based on patient's allergy profile. -Vitamin C, zinc -Antitussives as needed -Tylenol as needed -Inhaler as needed -Daily CBC with differential, CMP, CRP, D-dimer, LDH, procalcitonin -Airborne and contact precautions -Continuous pulse ox -Supplemental oxygen as needed to keep oxygen saturation above 90% -Blood culture x2 pending  ESRD on HD TTS Last HD session was 1/14.  No signs of volume overload at this time.  Potassium and bicarb levels normal. -Will need routine dialysis during this hospitalization.  Consult nephrology in a.m.  COPD -Stable.  No wheezing at this time.  Combivent as needed.  Insulin-dependent diabetes mellitus -Continue home Levemir 15 units daily with breakfast -Sliding scale insulin very sensitive ACHS and CBG checks  Troponinemia Suspect related to demand ischemia. High-sensitivity troponins x2 flat (39 >40).  EKG without acute ischemic changes.  Patient has no chest pain. -Cardiac monitoring  Goals of care/CODE STATUS discussion Patient's son and HCPOA Yaiza Romanski told ED provider that patient is DNR/DNI.  When I discussed CODE STATUS with the patient, she was in disagreement with what was stated by her son.  She wants to be full code.  She wants CPR/defibrillation in an event of cardiac arrest.  In addition, she wants intubation and mechanical ventilation if her respiratory status declines.  She is currently AAO x4 and seems to have capacity to make decisions for herself.  I called her son [name] and spoke to him. He stated that his mother had previously signed DNR/DNI paperwork but if at present she wishes to be full code, then he is in agreement.  DVT prophylaxis: Subcutaneous heparin Code Status: Full code Family Communication: Son updated over the phone. Disposition Plan: Anticipate discharge  after clinical improvement. Consults called: None Admission status: It is my clinical opinion that admission to INPATIENT is reasonable and necessary in this 74 y.o. female . presenting with acute on chronic hypoxic respiratory failure secondary to COVID-19 viral multifocal pneumonia.  Oxygen requirement significantly increased from baseline.  Very high risk of decompensation.  Given the aforementioned, the predictability of an adverse outcome is felt to be significant. I expect that the patient will require at least 2 midnights in the hospital to treat this condition.   The medical decision making on this patient was of high complexity and the patient is at high risk for clinical deterioration, therefore this is a level 3 visit.  Shela Leff MD [facility] Pager [phone]  If 7PM-7AM, please contact night-coverage [email or web address] Password [facility]  02/10/2019, 2:41 AM

## 2019-02-10 NOTE — ED Notes (Signed)
RN [place] updated son [name]

## 2019-02-10 NOTE — Consult Note (Signed)
Renal Service Consult Note [facility]  Joan Mosley 02/10/2019 Sol Blazing Requesting Physician:  Dr Karleen Hampshire  Reason for Consult:  ESRD pt w/ COVID infection HPI: The patient is a 74 y.o. year-old with hx of morbid obestiy, DM2, COPD, ESRD on HD, chron resp failure on home O2 5-6 lpm presented to ED sent from HD for hypoxia. Pt had recently tested + for covid 10 on 1/12.  She got most of her HD yest. Also c/o gen weakness, chills and body aches. Pt was admitted for acute /chron reps failure.   CXR shows bibasilar infiltrates per official report, we didn't see much that was new.  patinet seen in ED, she is very sleepy and doesn't give much hx.  Denies any current CP or SOB.      ROS n/a    Past Medical History  Past Medical History:  Diagnosis Date  . Arthritis   . COPD (chronic obstructive pulmonary disease) ([place])   . Diabetes mellitus without complication ([place])   . Diabetic neuropathy ([place])   . 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 Renal disorder    ESRD  . Thyroid disease   . TIA (transient ischemic attack)    Past Surgical History  Past Surgical History:  Procedure Laterality Date  . ABDOMINAL HYSTERECTOMY    . IR FLUORO GUIDE CV LINE RIGHT  11/14/2018  . RECTOCELE REPAIR    . REPLACEMENT TOTAL KNEE BILATERAL Bilateral 2008  . THYROIDECTOMY     80%  . VAGINAL WOUND CLOSURE / REPAIR     Family History  Family History  Problem Relation Age of Onset  . Heart disease Mother   . Hypertension Mother   . Heart disease Father   . Hypertension Father    Social History  reports that she quit smoking about 37 years ago. She has a 35.00 pack-year smoking history. She has never used smokeless tobacco. She reports that she does not drink alcohol or use drugs. Allergies  Allergies  Allergen Reactions  . Avelox [Moxifloxacin Hcl In Nacl] Other (See Comments)    Unknown reaction  . Codeine Anaphylaxis  . Penicillins Rash    Did it involve  swelling of the face/tongue/throat, SOB, or low BP? Unk Did it involve sudden or severe rash/hives, skin peeling, or any reaction on the inside of your mouth or nose? Yes Did you need to seek medical attention at a hospital or doctor's office? Unk When did it last happen? Unk If all above answers are "NO", may proceed with cephalosporin use.   . Sulfa Antibiotics Other (See Comments)    Unknown reaction  . Dextromethorphan-Guaifenesin Other (See Comments)    Reported by [facility] - unknown reaction  . Shellfish Allergy Hives, Itching and Rash   Home medications Prior to Admission medications   Medication Sig Start Date End Date Taking? Authorizing Provider  acetaminophen (TYLENOL) 500 MG tablet Take 1,000 mg by mouth every 6 (six) hours as needed (for arthritic pain).    Yes [provider]  benzonatate (TESSALON) 200 MG capsule Take 1 capsule (200 mg total) by mouth 2 (two) times daily as needed for cough. 02/08/19  Yes Nche, Charlene Brooke, NP  clonazePAM (KLONOPIN) 0.5 MG tablet 0.25mg  in AM and 0.5mg  in PM Patient taking differently: Take 0.25-0.5 mg by mouth 2 (two) times daily. 0.25mg  in AM and 0.5mg  in PM 02/08/19  Yes Nche, Charlene Brooke, NP  Darbepoetin Alfa (ARANESP) 25 MCG/0.42ML SOSY injection Inject  0.42 mLs (25 mcg total) into the vein every Tuesday with hemodialysis. 11/22/18  Yes Hosie Poisson, MD  docusate sodium (COLACE) 100 MG capsule Take 100 mg by mouth every morning.   Yes [provider]  FLUoxetine (PROZAC) 10 MG tablet Take 1 tablet (10 mg total) by mouth daily. 02/08/19  Yes Nche, Charlene Brooke, NP  furosemide (LASIX) 40 MG tablet Take 80 mg by mouth See admin instructions. Take 80 mg by mouth in the morning on Sun/Mon/Wed/Fri (non-dialysis days) 10/13/18  Yes [provider]  guaiFENesin-dextromethorphan (ROBITUSSIN DM) 100-10 MG/5ML syrup Take 20 mLs by mouth every 4 (four) hours as needed for cough.   Yes [provider]  insulin  detemir (LEVEMIR) 100 UNIT/ML injection Inject 0.1 mLs (10 Units total) into the skin daily. Patient taking differently: Inject 15 Units into the skin daily before breakfast.  11/22/18  Yes Hosie Poisson, MD  insulin lispro (HUMALOG) 100 UNIT/ML injection Inject 1-2 Units into the skin See admin instructions. Inject 1-2 units into the skin three times a day before meals, per sliding scale: 1 unit for every 50 points above a BGL of 150   Yes [provider]  ipratropium-albuterol (DUONEB) 0.5-2.5 (3) MG/3ML SOLN Take 3 mLs by nebulization See admin instructions. Nebulize and inhale 3 ml's into the lungs every four hours, while awake   Yes [provider]  levalbuterol (XOPENEX HFA) 45 MCG/ACT inhaler Inhale 2 puffs into the lungs every 8 (eight) hours as needed for wheezing or shortness of breath. 02/08/19  Yes Nche, Charlene Brooke, NP  levothyroxine (SYNTHROID) 112 MCG tablet Take 1 tablet (112 mcg total) by mouth daily before breakfast. 1 AM 02/08/19  Yes Nche, Charlene Brooke, NP  Lubricants (K-Y LUBRICANT JELLY SENSITIVE EX) Place 1 application into both nostrils as needed (as directed- for lubrication).    Yes [provider]  midodrine (PROAMATINE) 10 MG tablet Take 10 mg by mouth See admin instructions. Take 10 mg by mouth on Tuesday, Thursday, Saturday 45 minutes before dialysis   Yes [provider]  montelukast (SINGULAIR) 10 MG tablet Take 10 mg by mouth at bedtime.    Yes [provider]  multivitamin (RENA-VIT) TABS tablet Take 1 tablet by mouth at bedtime. 11/21/18  Yes Hosie Poisson, MD  NASAL SPRAY SALINE NA Place 1-2 sprays into both nostrils as needed (for congestion- "[employer] Simply Saline" brand).   Yes [provider]  Nutritional Supplements (FEEDING SUPPLEMENT, NEPRO CARB STEADY,) LIQD Take 237 mLs by mouth 2 (two) times daily between meals. 11/22/18  Yes Hosie Poisson, MD  nystatin (MYCOSTATIN/NYSTOP) powder Apply 1 g  topically 2 (two) times daily as needed (as directed- to any rashes).    Yes [provider]  nystatin cream (MYCOSTATIN) Apply 1 application topically 2 (two) times daily as needed for dry skin.    Yes [provider]  OVER THE COUNTER MEDICATION Stool softner otc   Yes [provider]  OXYGEN Inhale 5-6 L/min into the lungs continuous.   Yes [provider]  oxymetazoline (AFRIN) 0.05 % nasal spray Place 1 spray into both nostrils 2 (two) times daily as needed for congestion.   Yes [provider]  rosuvastatin (CRESTOR) 40 MG tablet Take 1 tablet (40 mg total) by mouth at bedtime. 02/08/19  Yes Nche, Charlene Brooke, NP  senna (SENOKOT) 8.6 MG TABS tablet Take 1 tablet by mouth every morning.   Yes [provider]  sevelamer carbonate (RENVELA) 800  MG tablet Take 2 tablets (1,600 mg total) by mouth 3 (three) times daily with meals. Patient taking differently: Take 800-1,600 mg by mouth See admin instructions. Take 1,600 mg by mouth three times a day with meals and 800-1,600 mg with each snack 11/21/18  Yes Hosie Poisson, MD  albuterol (PROVENTIL) (2.5 MG/3ML) 0.083% nebulizer solution Take 3 mLs (2.5 mg total) by nebulization 4 (four) times daily. Patient not taking: Reported on 02/09/2019 09/16/18   Mariel Aloe, MD  calcitRIOL (ROCALTROL) 0.25 MCG capsule Take 7 capsules (1.75 mcg total) by mouth Every Tuesday,Thursday,and Saturday with dialysis. 11/22/18   Hosie Poisson, MD  cinacalcet (SENSIPAR) 30 MG tablet Take 2 tablets (60 mg total) by mouth Every Tuesday,Thursday,and Saturday with dialysis. Patient not taking: Reported on 02/09/2019 11/22/18   Hosie Poisson, MD  lidocaine (LIDODERM) 5 % Place 1 patch onto the skin every 12 (twelve) hours as needed (back pain). Remove & Discard patch within 12 hours or as directed by MD     [provider]   Liver Function Tests Recent Labs  Lab 02/09/19 1946 02/10/19 0235  AST 24 18  ALT 13  13  ALKPHOS 86 77  BILITOT 1.3* 1.4*  PROT 5.5* 5.3*  ALBUMIN 2.7* 2.6*   No results for input(s): LIPASE, AMYLASE in the last 168 hours. CBC Recent Labs  Lab 02/07/19 1430 02/07/19 1430 02/09/19 1946 02/09/19 1957 02/10/19 0235  WBC 6.5  --  6.9  --  7.1  NEUTROABS 4.2  --   --   --  6.3  HGB 10.1*   < > 10.1* 10.9* 9.8*  HCT 33.2*   < > 33.8* 32.0* 32.1*  MCV 97.4  --  96.3  --  97.0  PLT 176  --  144*  --  132*   < > = values in this interval not displayed.   Basic Metabolic Panel Recent Labs  Lab 02/07/19 1430 02/09/19 1946 02/09/19 1957 02/10/19 0235  NA 132* 136 135 135  K 4.9 4.1 3.9 4.4  CL 91* 92*  --  94*  CO2 27 26  --  25  GLUCOSE 78 105*  --  119*  BUN 41* 15  --  18  CREATININE 6.84* 4.04*  --  4.36*  CALCIUM 8.2* 8.1*  --  8.3*   Iron/TIBC/Ferritin/ %Sat    Component Value Date/Time   FERRITIN 1,101 (H) 02/09/2019 1946    Vitals:   02/10/19 0733 02/10/19 0800 02/10/19 0843 02/10/19 0845  BP:  (!) 97/58  112/61  Pulse: 77 78 78   Resp:      Temp:      TempSrc:      SpO2: 97% 93% 100%     Exam Gen somnolent, arouses and answers simple questions and falls back asleep No rash, cyanosis or gangrene Sclera anicteric, throat clear  No jvd or bruits Chest clear bilat bilat, no wheezing RRR no MRG Abd soft ntnd no mass or ascites +bs obese GU defer MS no joint effusions or deformity Ext chronic 1-2+ distal pretib bilat edema, no wounds or ulcers Neuro is lethargic, nonfocal    Home meds:  - furosemide 80 qd/ midodrine 10 TTS pre hd  - sevelamer carb ac tid  - clonazepam 0.25 am and 0.50 pm/ fluoxetine 10 qd  - rosuvastatin 40 hs  - montelukast 10 hs/ levalbuterol tid prn/ home O2 3L  - levothyroxine 112 ug  - insulin detemir 15 qd/ lispro SSI tid  Outpt HD: TTS G-O    4h  400/800  94kg  2/2 bath  TDC R  - sensipar 60 po  - venofer 50 /wk  - mircera 75 ug q 2wk, last 1/14  - calc 1.75 ug tiw     Assessment/  Plan: 1. COVID + PNA - on IV steroids / remdesivir, and IV vanc / aztreonam ? CAP 2. ESRD - on HD TTS at COVID shift. Had HD yesterday, labs are good. Plan HD Sat, pull fluid as tolerated. 3. Hypotension/ volume - chronic hypotension on midodrine 4. DM 5. Anemia ckd - had esa yest 1/14      Kelly Splinter  MD 02/10/2019, 11:38 AM

## 2019-02-10 NOTE — Telephone Encounter (Signed)
Rx sent 

## 2019-02-10 NOTE — ED Notes (Signed)
Pt only has one IV, ordered infusions not compatible, vancomycin only medication available at this time, first infusion started.

## 2019-02-10 NOTE — ED Notes (Signed)
Son [name] called update given , MD at bedside. Patient status unchanged.

## 2019-02-10 NOTE — Progress Notes (Signed)
Heated HFNC slowly weaned from 20L/60% to 15L/60%. So far, pt has tolerated weaning, no obvious respiratory distress noted, RR 17, SpO2 93%. RT will continue to monitor and wean HFNC as tolerated.

## 2019-02-10 NOTE — Progress Notes (Signed)
74 year old lady with prior history of end-stage renal disease on dialysis, COPD,, chronic respiratory failure on 5 L of home oxygen, type 2 diabetes mellitus, hypothyroidism, TIA presented to ED from dialysis for evaluation of hypoxia.  Patient was tested positive for COVID-19 on 02/07/2019 and 20 minutes into her dialysis she was noted to be hypoxic and was brought to ED for further evaluation.  On arrival to ED patient was saturating 88% on nonrebreather and she was switched to 20 L of high flow oxygen and sats have been around 92%.  Chest x-ray showed asymmetric pulmonary opacities compatible with COVID-19 pneumonia mildly progressed in the lower lungs compared to prior chest x-ray from 2 days ago. She is now admitted for further evaluation and management on acute on chronic respiratory failure with hypoxia secondary to COVID-19 multifocal pneumonia. Nephrology consulted for dialysis and plan for dialysis tomorrow.   Patient seen and examined at bedside She is barely arousable to verbal cues, not following commands. CVS S1S2, RRR Lungs diminished at bases scattered rails.   Abdomen is soft, nontender bowel sounds normal Extremities: pedal edema present   PLAN:  Continue with IV remdesivir, IV steroids, IV antibiotics empirically for multifocal pneumonia. Fluid management per dialysis. ABG.  Was admitted earlier this morning by Dr. Marlowe Sax please see Dr. Elon Jester note for detailed H&P   Hosie Poisson, MD

## 2019-02-10 NOTE — ED Notes (Signed)
SDU  Breakfast ordered  

## 2019-02-10 NOTE — ED Notes (Signed)
Refused breakfast.

## 2019-02-10 NOTE — Telephone Encounter (Signed)
Ok to change to rx to albuterol HFA

## 2019-02-11 DIAGNOSIS — Z794 Long term (current) use of insulin: Secondary | ICD-10-CM

## 2019-02-11 DIAGNOSIS — E1142 Type 2 diabetes mellitus with diabetic polyneuropathy: Secondary | ICD-10-CM

## 2019-02-11 DIAGNOSIS — J9621 Acute and chronic respiratory failure with hypoxia: Secondary | ICD-10-CM

## 2019-02-11 LAB — COMPREHENSIVE METABOLIC PANEL
ALT: 12 U/L (ref 0–44)
AST: 22 U/L (ref 15–41)
Albumin: 2.5 g/dL — ABNORMAL LOW (ref 3.5–5.0)
Alkaline Phosphatase: 63 U/L (ref 38–126)
Anion gap: 21 — ABNORMAL HIGH (ref 5–15)
BUN: 37 mg/dL — ABNORMAL HIGH (ref 8–23)
CO2: 22 mmol/L (ref 22–32)
Calcium: 8.3 mg/dL — ABNORMAL LOW (ref 8.9–10.3)
Chloride: 94 mmol/L — ABNORMAL LOW (ref 98–111)
Creatinine, Ser: 6.01 mg/dL — ABNORMAL HIGH (ref 0.44–1.00)
GFR calc Af Amer: 7 mL/min — ABNORMAL LOW (ref >60)
GFR calc non Af Amer: 6 mL/min — ABNORMAL LOW (ref >60)
Glucose, Bld: 83 mg/dL (ref 70–99)
Potassium: 4.6 mmol/L (ref 3.5–5.1)
Sodium: 137 mmol/L (ref 135–145)
Total Bilirubin: 1 mg/dL (ref 0.3–1.2)
Total Protein: 5.2 g/dL — ABNORMAL LOW (ref 6.5–8.1)

## 2019-02-11 LAB — LACTATE DEHYDROGENASE: LDH: 223 U/L — ABNORMAL HIGH (ref 98–192)

## 2019-02-11 LAB — GLUCOSE, CAPILLARY
Glucose-Capillary: 75 mg/dL (ref 70–99)
Glucose-Capillary: 115 mg/dL — ABNORMAL HIGH (ref 70–99)
Glucose-Capillary: 164 mg/dL — ABNORMAL HIGH (ref 70–99)

## 2019-02-11 LAB — CBC WITH DIFFERENTIAL/PLATELET
Abs Immature Granulocytes: 0.09 10*3/uL — ABNORMAL HIGH (ref 0.00–0.07)
Basophils Absolute: 0 10*3/uL (ref 0.0–0.1)
Basophils Relative: 0 %
Eosinophils Absolute: 0 10*3/uL (ref 0.0–0.5)
Eosinophils Relative: 0 %
HCT: 31.6 % — ABNORMAL LOW (ref 36.0–46.0)
Hemoglobin: 9.6 g/dL — ABNORMAL LOW (ref 12.0–15.0)
Immature Granulocytes: 2 %
Lymphocytes Relative: 19 %
Lymphs Abs: 0.9 10*3/uL (ref 0.7–4.0)
MCH: 29.3 pg (ref 26.0–34.0)
MCHC: 30.4 g/dL (ref 30.0–36.0)
MCV: 96.3 fL (ref 80.0–100.0)
Monocytes Absolute: 0.5 10*3/uL (ref 0.1–1.0)
Monocytes Relative: 11 %
Neutro Abs: 3 10*3/uL (ref 1.7–7.7)
Neutrophils Relative %: 68 %
Platelets: 132 10*3/uL — ABNORMAL LOW (ref 150–400)
RBC: 3.28 MIL/uL — ABNORMAL LOW (ref 3.87–5.11)
RDW: 17.3 % — ABNORMAL HIGH (ref 11.5–15.5)
WBC: 4.5 10*3/uL (ref 4.0–10.5)
nRBC: 0.4 % — ABNORMAL HIGH (ref 0.0–0.2)

## 2019-02-11 LAB — PROCALCITONIN: Procalcitonin: 0.58 ng/mL

## 2019-02-11 LAB — C-REACTIVE PROTEIN: CRP: 8.9 mg/dL — ABNORMAL HIGH (ref <1.0)

## 2019-02-11 LAB — MRSA PCR SCREENING: MRSA by PCR: NEGATIVE

## 2019-02-11 LAB — D-DIMER, QUANTITATIVE: D-Dimer, Quant: 1.33 ug/mL-FEU — ABNORMAL HIGH (ref 0.00–0.50)

## 2019-02-11 MED ORDER — HEPARIN SODIUM (PORCINE) 1000 UNIT/ML IJ SOLN
INTRAMUSCULAR | Status: AC
  Administered 2019-02-11: 1000 [IU]
  Filled 2019-02-11: qty 4

## 2019-02-11 MED ORDER — HEPARIN SODIUM (PORCINE) 5000 UNIT/ML IJ SOLN
INTRAMUSCULAR | Status: AC
  Filled 2019-02-11: qty 1

## 2019-02-11 MED ORDER — LEVOTHYROXINE SODIUM 112 MCG PO TABS
112.0000 ug | ORAL_TABLET | Freq: Every day | ORAL | Status: DC
  Administered 2019-02-12 – 2019-02-22 (×10): 112 ug via ORAL
  Filled 2019-02-11 (×11): qty 1

## 2019-02-11 MED ORDER — MIDODRINE HCL 5 MG PO TABS
10.0000 mg | ORAL_TABLET | Freq: Two times a day (BID) | ORAL | Status: DC
  Administered 2019-02-11 – 2019-02-22 (×21): 10 mg via ORAL
  Filled 2019-02-11 (×25): qty 2

## 2019-02-11 MED ORDER — SODIUM CHLORIDE 0.9% FLUSH: 3.0000 mL | INTRAVENOUS | Status: DC | PRN

## 2019-02-11 MED ORDER — SODIUM CHLORIDE 0.9% FLUSH
3.0000 mL | Freq: Two times a day (BID) | INTRAVENOUS | Status: DC
  Administered 2019-02-11 – 2019-02-22 (×23): 3 mL via INTRAVENOUS

## 2019-02-11 MED ORDER — HEPARIN SODIUM (PORCINE) 5000 UNIT/ML IJ SOLN
7500.0000 [IU] | Freq: Three times a day (TID) | INTRAMUSCULAR | Status: DC
  Administered 2019-02-11 – 2019-02-19 (×24): 7500 [IU] via SUBCUTANEOUS
  Administered 2019-02-20: 22:00:00 5000 [IU] via SUBCUTANEOUS
  Administered 2019-02-20 (×2): 7500 [IU] via SUBCUTANEOUS
  Administered 2019-02-21: 07:00:00 5000 [IU] via SUBCUTANEOUS
  Administered 2019-02-21 – 2019-02-22 (×5): 7500 [IU] via SUBCUTANEOUS
  Filled 2019-02-11 (×33): qty 2

## 2019-02-11 NOTE — ED Notes (Signed)
ED TO INPATIENT HANDOFF REPORT  ED Nurse Name and Phone #: Marye Round [phone]  S Name/Age/Gender Joan Mosley 74 y.o. female Room/Bed: 030C/030C  Code Status   Code Status: Full Code  Home/SNF/Other Home Patient oriented to: self, place, time and situation Is this baseline? Yes   Triage Complete: Triage complete  Chief Complaint Pneumonia due to COVID-19 virus [U07.1, J12.82] COVID-19 [U07.1]  Triage Note Pt here from dialysis / pt did finish her dialysis , pt sats 65% on 3 liters at dialysis and pt placed on 4 liters by ptar , sats up to 89 % on 4 liters on arrival . Pt is COVID  Positive     Allergies Allergies  Allergen Reactions  . Avelox [Moxifloxacin Hcl In Nacl] Other (See Comments)    Unknown reaction  . Codeine Anaphylaxis  . Penicillins Rash    Did it involve swelling of the face/tongue/throat, SOB, or low BP? Unk Did it involve sudden or severe rash/hives, skin peeling, or any reaction on the inside of your mouth or nose? Yes Did you need to seek medical attention at a hospital or doctor's office? Unk When did it last happen? Unk If all above answers are "NO", may proceed with cephalosporin use.   . Sulfa Antibiotics Other (See Comments)    Unknown reaction  . Dextromethorphan-Guaifenesin Other (See Comments)    Reported by [facility] - unknown reaction  . Shellfish Allergy Hives, Itching and Rash    Level of Care/Admitting Diagnosis ED Disposition    ED Disposition Condition [facility]: [place] [[id number]]  Level of Care: Progressive [102]  Admit to Progressive based on following criteria: RESPIRATORY PROBLEMS hypoxemic/hypercapnic respiratory failure that is responsive to NIPPV (BiPAP) or High Flow Nasal Cannula (6-80 lpm). Frequent assessment/intervention, no > Q2 hrs < Q4 hrs, to maintain oxygenation and pulmonary hygiene.  Covid Evaluation: Confirmed COVID Positive  Diagnosis: COVID-19 [id number]   Admitting Physician: Janeece Fitting [id number]  Attending Physician: Hosie Poisson [[id number]  Estimated length of stay: 3 - 4 days  Certification:: I certify this patient will need inpatient services for at least 2 midnights       B Medical/Surgery History Past Medical History:  Diagnosis Date  . Arthritis   . COPD (chronic obstructive pulmonary disease) ([place])   . Diabetes mellitus without complication ([place])   . Diabetic neuropathy ([place])   . 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 Renal disorder    ESRD  . Thyroid disease   . TIA (transient ischemic attack)    Past Surgical History:  Procedure Laterality Date  . ABDOMINAL HYSTERECTOMY    . IR FLUORO GUIDE CV LINE RIGHT  11/14/2018  . RECTOCELE REPAIR    . REPLACEMENT TOTAL KNEE BILATERAL Bilateral 2008  . THYROIDECTOMY     80%  . VAGINAL WOUND CLOSURE / REPAIR       A IV Location/Drains/Wounds Patient Lines/Drains/Airways Status   Active Line/Drains/Airways    Name:   Placement date:   Placement time:   Site:   Days:   Peripheral IV 02/09/19 Left Antecubital   02/09/19    2108    Antecubital   2   Hemodialysis Catheter Right Internal jugular Double lumen Permanent (Tunneled)   11/14/18    1142    Internal jugular   89   Hemodialysis Catheter   --    --    --  Intake/Output Last 24 hours  Intake/Output Summary (Last 24 hours) at 02/11/2019 0014 Last data filed at 02/10/2019 0349 Gross per 24 hour  Intake 700 ml  Output --  Net 700 ml    Labs/Imaging Results for orders placed or performed during the hospital encounter of 02/09/19 (from the past 48 hour(s))  CBC     Status: Abnormal   Collection Time: 02/09/19  7:46 PM  Result Value Ref Range   WBC 6.9 4.0 - 10.5 K/uL   RBC 3.51 (L) 3.87 - 5.11 MIL/uL   Hemoglobin 10.1 (L) 12.0 - 15.0 g/dL   HCT 33.8 (L) 36.0 - 46.0 %   MCV 96.3 80.0 - 100.0 fL   MCH 28.8 26.0 - 34.0 pg   MCHC 29.9 (L) 30.0 - 36.0 g/dL   RDW 17.1 (H) 11.5 - 15.5 %   Platelets  144 (L) 150 - 400 K/uL   nRBC 0.0 0.0 - 0.2 %    Comment: Performed at [facility], [address] [place], [place] [address]  Comprehensive metabolic panel     Status: Abnormal   Collection Time: 02/09/19  7:46 PM  Result Value Ref Range   Sodium 136 135 - 145 mmol/L   Potassium 4.1 3.5 - 5.1 mmol/L   Chloride 92 (L) 98 - 111 mmol/L   CO2 26 22 - 32 mmol/L   Glucose, Bld 105 (H) 70 - 99 mg/dL   BUN 15 8 - 23 mg/dL   Creatinine, Ser 4.04 (H) 0.44 - 1.00 mg/dL   Calcium 8.1 (L) 8.9 - 10.3 mg/dL   Total Protein 5.5 (L) 6.5 - 8.1 g/dL   Albumin 2.7 (L) 3.5 - 5.0 g/dL   AST 24 15 - 41 U/L   ALT 13 0 - 44 U/L   Alkaline Phosphatase 86 38 - 126 U/L   Total Bilirubin 1.3 (H) 0.3 - 1.2 mg/dL   GFR calc non Af Amer 10 (L) >60 mL/min   GFR calc Af Amer 12 (L) >60 mL/min   Anion gap 18 (H) 5 - 15    Comment: Performed at [facility], [place] [address], [place], [place] [address]  ABO/Rh     Status: None   Collection Time: 02/09/19  7:46 PM  Result Value Ref Range   ABO/RH(D)      O POS Performed at [facility] [address] [place], [place] [address]   Brain natriuretic peptide     Status: Abnormal   Collection Time: 02/09/19  7:46 PM  Result Value Ref Range   B Natriuretic Peptide 273.0 (H) 0.0 - 100.0 pg/mL    Comment: Performed at [facility] [address], [place], [place] [address]  C-reactive protein     Status: Abnormal   Collection Time: 02/09/19  7:46 PM  Result Value Ref Range   CRP 3.8 (H) <1.0 mg/dL    Comment: Performed at [facility] [address], [place], [place] [address]  D-dimer, quantitative (not at [facility])     Status: Abnormal   Collection Time: 02/09/19  7:46 PM  Result Value Ref Range   D-Dimer, Quant 2.68 (H) 0.00 - 0.50 ug/mL-FEU    Comment: (NOTE) At the manufacturer cut-off of 0.50 ug/mL FEU, this assay has been documented to exclude PE with a sensitivity and negative predictive value of 97 to 99%.  At this time,  this assay has not been approved by the FDA to exclude DVT/VTE.  Results should be correlated with clinical presentation. Performed at [facility], [place] [address] [place], [place] [address]   Ferritin     Status: Abnormal   Collection Time: 02/09/19  7:46 PM  Result Value Ref Range   Ferritin 1,101 (H) 11 - 307 ng/mL    Comment: Performed at [facility], [place] [address] [place], [place] [address]  Lactate dehydrogenase     Status: Abnormal   Collection Time: 02/09/19  7:46 PM  Result Value Ref Range   LDH 267 (H) 98 - 192 U/L    Comment: Performed at [facility], [place] [address] [place], [place] [address]  Procalcitonin     Status: None   Collection Time: 02/09/19  7:46 PM  Result Value Ref Range   Procalcitonin 0.58 ng/mL    Comment:        Interpretation: PCT > 0.5 ng/mL and <= 2 ng/mL: Systemic infection (sepsis) is possible, but other conditions are known to elevate PCT as well. (NOTE)       Sepsis PCT Algorithm           Lower Respiratory Tract                                      Infection PCT Algorithm    ----------------------------     ----------------------------         PCT < 0.25 ng/mL                PCT < 0.10 ng/mL         Strongly encourage             Strongly discourage   discontinuation of antibiotics    initiation of antibiotics    ----------------------------     -----------------------------       PCT 0.25 - 0.50 ng/mL            PCT 0.10 - 0.25 ng/mL               OR       >80% decrease in PCT            Discourage initiation of                                            antibiotics      Encourage discontinuation           of antibiotics    ----------------------------     -----------------------------         PCT >= 0.50 ng/mL              PCT 0.26 - 0.50 ng/mL                AND       <80% decrease in PCT             Encourage initiation of                                             antibiotics       Encourage continuation            of antibiotics    ----------------------------     -----------------------------  PCT >= 0.50 ng/mL                  PCT > 0.50 ng/mL               AND         increase in PCT                  Strongly encourage                                      initiation of antibiotics    Strongly encourage escalation           of antibiotics                                     -----------------------------                                           PCT <= 0.25 ng/mL                                                 OR                                        > 80% decrease in PCT                                     Discontinue / Do not initiate                                             antibiotics Performed at [facility], [address] [place], [place] [address]   Troponin I (High Sensitivity)     Status: Abnormal   Collection Time: 02/09/19  7:46 PM  Result Value Ref Range   Troponin I (High Sensitivity) 39 (H) <18 ng/L    Comment: (NOTE) Elevated high sensitivity troponin I (hsTnI) values and significant  changes across serial measurements may suggest ACS but many other  chronic and acute conditions are known to elevate hsTnI results.  Refer to the "Links" section for chest pain algorithms and additional  guidance. Performed at [facility], [place] [address], [place], [place] [address]   POCT I-Stat EG7     Status: Abnormal   Collection Time: 02/09/19  7:57 PM  Result Value Ref Range   pH, Ven 7.292 7.250 - 7.430   pCO2, Ven 62.7 (H) 44.0 - 60.0 mmHg   pO2, Ven 52.0 (H) 32.0 - 45.0 mmHg   Bicarbonate 30.2 (H) 20.0 - 28.0 mmol/L   TCO2 32 22 - 32 mmol/L   O2 Saturation 81.0 %   Acid-Base Excess 2.0 0.0 - 2.0 mmol/L   Sodium 135 135 - 145 mmol/L   Potassium 3.9 3.5 - 5.1  mmol/L   Calcium, Ion 1.00 (L) 1.15 - 1.40 mmol/L   HCT 32.0 (L) 36.0 - 46.0 %   Hemoglobin 10.9 (L) 12.0 - 15.0 g/dL   Patient temperature HIDE    Sample type VENOUS   Troponin I (High  Sensitivity)     Status: Abnormal   Collection Time: 02/09/19  9:09 PM  Result Value Ref Range   Troponin I (High Sensitivity) 40 (H) <18 ng/L    Comment: (NOTE) Elevated high sensitivity troponin I (hsTnI) values and significant  changes across serial measurements may suggest ACS but many other  chronic and acute conditions are known to elevate hsTnI results.  Refer to the "Links" section for chest pain algorithms and additional  guidance. Performed at [facility], [place] [address] [place], [place] [address]   CBG monitoring, ED     Status: None   Collection Time: 02/09/19 11:21 PM  Result Value Ref Range   Glucose-Capillary 87 70 - 99 mg/dL  CBC with Differential/Platelet     Status: Abnormal   Collection Time: 02/10/19  2:35 AM  Result Value Ref Range   WBC 7.1 4.0 - 10.5 K/uL   RBC 3.31 (L) 3.87 - 5.11 MIL/uL   Hemoglobin 9.8 (L) 12.0 - 15.0 g/dL   HCT 32.1 (L) 36.0 - 46.0 %   MCV 97.0 80.0 - 100.0 fL   MCH 29.6 26.0 - 34.0 pg   MCHC 30.5 30.0 - 36.0 g/dL   RDW 17.0 (H) 11.5 - 15.5 %   Platelets 132 (L) 150 - 400 K/uL   nRBC 0.0 0.0 - 0.2 %   Neutrophils Relative % 89 %   Neutro Abs 6.3 1.7 - 7.7 K/uL   Lymphocytes Relative 6 %   Lymphs Abs 0.4 (L) 0.7 - 4.0 K/uL   Monocytes Relative 4 %   Monocytes Absolute 0.3 0.1 - 1.0 K/uL   Eosinophils Relative 0 %   Eosinophils Absolute 0.0 0.0 - 0.5 K/uL   Basophils Relative 0 %   Basophils Absolute 0.0 0.0 - 0.1 K/uL   Immature Granulocytes 1 %   Abs Immature Granulocytes 0.05 0.00 - 0.07 K/uL    Comment: Performed at [facility], [place] [address], [place], [place] [address]  Comprehensive metabolic panel     Status: Abnormal   Collection Time: 02/10/19  2:35 AM  Result Value Ref Range   Sodium 135 135 - 145 mmol/L   Potassium 4.4 3.5 - 5.1 mmol/L   Chloride 94 (L) 98 - 111 mmol/L   CO2 25 22 - 32 mmol/L   Glucose, Bld 119 (H) 70 - 99 mg/dL   BUN 18 8 - 23 mg/dL   Creatinine, Ser 4.36 (H) 0.44 - 1.00 mg/dL    Calcium 8.3 (L) 8.9 - 10.3 mg/dL   Total Protein 5.3 (L) 6.5 - 8.1 g/dL   Albumin 2.6 (L) 3.5 - 5.0 g/dL   AST 18 15 - 41 U/L   ALT 13 0 - 44 U/L   Alkaline Phosphatase 77 38 - 126 U/L   Total Bilirubin 1.4 (H) 0.3 - 1.2 mg/dL   GFR calc non Af Amer 9 (L) >60 mL/min   GFR calc Af Amer 11 (L) >60 mL/min   Anion gap 16 (H) 5 - 15    Comment: Performed at [facility], [place] [address] [place], [place] [address]  C-reactive protein     Status: Abnormal   Collection Time: 02/10/19  2:35 AM  Result Value Ref Range   CRP 6.4 (H) <1.0 mg/dL    Comment: Performed at [facility] [address] [place], [place] [address]  D-dimer, quantitative (not at [facility])     Status: Abnormal   Collection Time: 02/10/19  2:35 AM  Result Value Ref Range   D-Dimer, Quant 1.92 (H) 0.00 - 0.50 ug/mL-FEU    Comment: (NOTE) At the manufacturer cut-off of 0.50 ug/mL FEU, this assay has been documented to exclude PE with a sensitivity and negative predictive value of 97 to 99%.  At this time, this assay has not been approved by the FDA to exclude DVT/VTE. Results should be correlated with clinical presentation. Performed at [facility], [place] [address], [place], [place] [address]   Procalcitonin     Status: None   Collection Time: 02/10/19  2:35 AM  Result Value Ref Range   Procalcitonin 0.62 ng/mL    Comment:        Interpretation: PCT > 0.5 ng/mL and <= 2 ng/mL: Systemic infection (sepsis) is possible, but other conditions are known to elevate PCT as well. (NOTE)       Sepsis PCT Algorithm           Lower Respiratory Tract                                      Infection PCT Algorithm    ----------------------------     ----------------------------         PCT < 0.25 ng/mL                PCT < 0.10 ng/mL         Strongly encourage             Strongly discourage   discontinuation of antibiotics    initiation of antibiotics    ----------------------------      -----------------------------       PCT 0.25 - 0.50 ng/mL            PCT 0.10 - 0.25 ng/mL               OR       >80% decrease in PCT            Discourage initiation of                                            antibiotics      Encourage discontinuation           of antibiotics    ----------------------------     -----------------------------         PCT >= 0.50 ng/mL              PCT 0.26 - 0.50 ng/mL                AND       <80% decrease in PCT             Encourage initiation of                                             antibiotics  Encourage continuation           of antibiotics    ----------------------------     -----------------------------        PCT >= 0.50 ng/mL                  PCT > 0.50 ng/mL               AND         increase in PCT                  Strongly encourage                                      initiation of antibiotics    Strongly encourage escalation           of antibiotics                                     -----------------------------                                           PCT <= 0.25 ng/mL                                                 OR                                        > 80% decrease in PCT                                     Discontinue / Do not initiate                                             antibiotics Performed at [facility], [address] [place], [place] [address]   Lactate dehydrogenase     Status: Abnormal   Collection Time: 02/10/19  2:35 AM  Result Value Ref Range   LDH 239 (H) 98 - 192 U/L    Comment: Performed at [facility], [place] [address], [place], [place] [address]  CBG monitoring, ED     Status: Abnormal   Collection Time: 02/10/19  7:51 AM  Result Value Ref Range   Glucose-Capillary 134 (H) 70 - 99 mg/dL   Comment 1 Notify RN    Comment 2 Document in Chart   CBG monitoring, ED     Status: None   Collection Time: 02/10/19  1:08 PM  Result Value Ref Range   Glucose-Capillary 93 70 -  99 mg/dL   Comment 1 Notify RN    Comment 2 Document in Chart   CBG monitoring, ED     Status: None   Collection Time: 02/10/19  2:50 PM  Result Value Ref Range  Glucose-Capillary 96 70 - 99 mg/dL   Comment 1 Notify RN    Comment 2 Document in Chart   CBG monitoring, ED     Status: Abnormal   Collection Time: 02/10/19  4:50 PM  Result Value Ref Range   Glucose-Capillary 107 (H) 70 - 99 mg/dL   Comment 1 Document in Chart   I-STAT 7, (LYTES, BLD GAS, ICA, H+H)     Status: Abnormal   Collection Time: 02/10/19  5:12 PM  Result Value Ref Range   pH, Arterial 7.364 7.350 - 7.450   pCO2 arterial 47.1 32.0 - 48.0 mmHg   pO2, Arterial 79.0 (L) 83.0 - 108.0 mmHg   Bicarbonate 26.7 20.0 - 28.0 mmol/L   TCO2 28 22 - 32 mmol/L   O2 Saturation 95.0 %   Acid-Base Excess 1.0 0.0 - 2.0 mmol/L   Sodium 132 (L) 135 - 145 mmol/L   Potassium 5.0 3.5 - 5.1 mmol/L   Calcium, Ion 1.12 (L) 1.15 - 1.40 mmol/L   HCT 29.0 (L) 36.0 - 46.0 %   Hemoglobin 9.9 (L) 12.0 - 15.0 g/dL   Patient temperature 99.5 F    Sample type ARTERIAL   CBG monitoring, ED     Status: Abnormal   Collection Time: 02/10/19 10:32 PM  Result Value Ref Range   Glucose-Capillary 183 (H) 70 - 99 mg/dL   Comment 1 Document in Chart    DG Chest Portable 1 View  Result Date: 02/09/2019 CLINICAL DATA:  74 year old female positive COVID-19. Shortness of breath. EXAM: PORTABLE CHEST 1 VIEW COMPARISON:  Portable chest 02/07/2019.  Chest CT 11/16/2018. FINDINGS: Portable AP semi upright view at 1843 hours. Stable right chest dialysis type catheter. Stable lung volumes and mediastinal contours. Asymmetric increased interstitial opacity is now most pronounced at the left lung base. There is also increased streaky medial right lung base opacity. No pneumothorax. No pleural effusion is evident. No acute osseous abnormality identified. IMPRESSION: Asymmetric pulmonary opacity compatible with COVID-19 pneumonia in this setting. Mild progression in  both lower lungs since 02/07/2019. Electronically Signed   By: Genevie Ann M.D.   On: 02/09/2019 19:36    Pending Labs Unresulted Labs (From admission, onward)    Start     Ordered   02/10/19 1653  Blood gas, arterial  Once,   R     02/10/19 1652   02/10/19 0500  CBC with Differential/Platelet  Daily,   R     02/09/19 2258   02/10/19 0500  Comprehensive metabolic panel  Daily,   R     02/09/19 2258   02/10/19 0500  C-reactive protein  Daily,   R     02/09/19 2258   02/10/19 0500  D-dimer, quantitative (not at [facility])  Daily,   R     02/09/19 2258   02/10/19 0500  Procalcitonin  Daily,   R     02/09/19 2258   02/10/19 0500  Lactate dehydrogenase  Daily,   R     02/09/19 2258          Vitals/Pain Today's Vitals   02/10/19 2238 02/10/19 2300 02/10/19 2314 02/10/19 2315  BP:  101/66  (!) 104/56  Pulse:  85  85  Resp:  (!) 28 19 17   Temp:      TempSrc:      SpO2:  93% 94% 93%  PainSc: 0-No pain       Isolation Precautions Airborne and Contact precautions  Medications Medications  rosuvastatin (CRESTOR)  tablet 40 mg (40 mg Oral Given 02/10/19 2221)  midodrine (PROAMATINE) tablet 10 mg (has no administration in time range)  FLUoxetine (PROZAC) capsule 10 mg (10 mg Oral Not Given 02/10/19 1028)  calcitRIOL (ROCALTROL) capsule 1.75 mcg (has no administration in time range)  insulin detemir (LEVEMIR) injection 15 Units (15 Units Subcutaneous Given 02/10/19 0910)  docusate sodium (COLACE) capsule 100 mg (100 mg Oral Not Given 02/10/19 1028)  senna (SENOKOT) tablet 8.6 mg (8.6 mg Oral Not Given 02/10/19 1029)  sevelamer carbonate (RENVELA) tablet 1,600 mg (1,600 mg Oral Not Given 02/10/19 1651)  multivitamin (RENA-VIT) tablet 1 tablet (1 tablet Oral Given 02/10/19 2226)  feeding supplement (NEPRO CARB STEADY) liquid 237 mL (237 mLs Oral Not Given 02/10/19 1418)  benzonatate (TESSALON) capsule 200 mg (has no administration in time range)  guaiFENesin-dextromethorphan (ROBITUSSIN DM) 100-10  MG/5ML syrup 20 mL (has no administration in time range)  montelukast (SINGULAIR) tablet 10 mg (10 mg Oral Given 02/10/19 2226)  oxymetazoline (AFRIN) 0.05 % nasal spray 1 spray (has no administration in time range)  insulin aspart (novoLOG) injection 0-6 Units (0 Units Subcutaneous Not Given 02/10/19 1652)  insulin aspart (novoLOG) injection 0-5 Units (0 Units Subcutaneous Not Given 02/10/19 2233)  heparin injection 5,000 Units (5,000 Units Subcutaneous Given 02/10/19 2233)  remdesivir 200 mg in sodium chloride 0.9% 250 mL IVPB (0 mg Intravenous Stopped 02/10/19 0349)    Followed by  remdesivir 100 mg in sodium chloride 0.9 % 100 mL IVPB (has no administration in time range)  dexamethasone (DECADRON) injection 6 mg (6 mg Intravenous Given 02/10/19 1022)  ascorbic acid (VITAMIN C) tablet 500 mg (500 mg Oral Not Given 02/10/19 1029)  zinc sulfate capsule 220 mg (220 mg Oral Not Given 02/10/19 1030)  acetaminophen (TYLENOL) tablet 650 mg (650 mg Oral Given 02/09/19 2354)  sevelamer carbonate (RENVELA) tablet 800 mg (has no administration in time range)  aztreonam (AZACTAM) 0.5 g in dextrose 5 % 50 mL IVPB (0 g Intravenous Stopped 02/10/19 2327)  vancomycin (VANCOCIN) IVPB 1000 mg/200 mL premix (has no administration in time range)  Ipratropium-Albuterol (COMBIVENT) respimat 2 puff (2 puffs Inhalation Given 02/10/19 2006)  clonazePAM (KLONOPIN) disintegrating tablet 0.25 mg (0 mg Oral Hold 02/10/19 1115)  clonazePAM (KLONOPIN) tablet 0.5 mg (0.5 mg Oral Given 02/10/19 2221)  Chlorhexidine Gluconate Cloth 2 % PADS 6 each (6 each Topical Not Given 02/10/19 0916)  levothyroxine (SYNTHROID, LEVOTHROID) injection 56 mcg (56 mcg Intravenous Given 02/10/19 1116)  clonazePAM (KLONOPIN) tablet 0.5 mg (0.5 mg Oral Given 02/09/19 2329)  vancomycin (VANCOREADY) IVPB 2000 mg/400 mL (0 mg Intravenous Stopped 02/10/19 0220)    Mobility walks with device Moderate fall risk   Focused Assessments Cardiac Assessment  Handoff:    Lab Results  Component Value Date   TROPONINI <0.03 04/19/2018   Lab Results  Component Value Date   DDIMER 1.92 (H) 02/10/2019   Does the Patient currently have chest pain? No  , Pulmonary Assessment Handoff:  Lung sounds: Bilateral Breath Sounds: Diminished O2 Device: High Flow Nasal Cannula O2 Flow Rate (L/min): (S) 15 L/min      R Recommendations: See Admitting Provider Note  Report given to:   Additional Notes:

## 2019-02-11 NOTE — Progress Notes (Signed)
Called by RN that patient was desating. Increased to 20L and 100%. Patient resting comfortably and sat 90-93%. RT will continue to monitor.

## 2019-02-11 NOTE — Plan of Care (Signed)
?  Problem: Education: ?Goal: Knowledge of General Education information will improve ?Description: Including pain rating scale, medication(s)/side effects and non-pharmacologic comfort measures ?Outcome: Progressing ?  ?Problem: Health Behavior/Discharge Planning: ?Goal: Ability to manage health-related needs will improve ?Outcome: Progressing ?  ?Problem: Clinical Measurements: ?Goal: Ability to maintain clinical measurements within normal limits will improve ?Outcome: Progressing ?Goal: Respiratory complications will improve ?Outcome: Not Progressing ?  ?

## 2019-02-11 NOTE — Progress Notes (Signed)
PROGRESS NOTE    Joan Mosley  [id number] DOB: [birth year] DOA: 02/09/2019 PCP: Flossie Buffy, NP    Brief Narrative: 74 year old lady with prior history of end-stage renal disease on dialysis, COPD,, chronic respiratory failure on 5 L of home oxygen, type 2 diabetes mellitus, hypothyroidism, TIA presented to ED from dialysis for evaluation of hypoxia.  Patient was tested positive for COVID-19 on 02/07/2019 and 20 minutes into her dialysis she was noted to be hypoxic and was brought to ED for further evaluation.  On arrival to ED patient was saturating 88% on nonrebreather and she was switched to 20 L of high flow oxygen and sats have been around 92%.  Chest x-ray showed asymmetric pulmonary opacities compatible with COVID-19 pneumonia mildly progressed in the lower lungs compared to prior chest x-ray from 2 days ago. She is now admitted for further evaluation and management on acute on chronic respiratory failure with hypoxia secondary to COVID-19 multifocal pneumonia. Nephrology consulted for dialysis and plan for dialysis .  Assessment & Plan:   Principal Problem:   Pneumonia due to COVID-19 virus Active Problems:   Type 2 diabetes mellitus with diabetic polyneuropathy, with long-term current use of insulin (HCC)   ESRD (end stage renal disease) (HCC)   Acute on chronic respiratory failure with hypoxia (HCC)   COPD (chronic obstructive pulmonary disease) ([place])   COVID-19   Acute on chronic respiratory failure with hypoxia secondary to COVID-19 multifocal pneumonia Patient tested positive for Covid on 02/07/2019.  Currently requiring up to 12 L of high flow nasal cannula oxygen and oxygen sats have been around 92%. Continue with incentive spirometry and flutter valve. LDH is 223, CRP is 8.9, pro calcitonin at 0.58, WBC count within normal limits, D-dimer is 1.33, MRSA PCR is negative. Chest x-ray on admission shows asymmetric pulmonary opacity compatible with COVID-19 pneumonia  in this setting. Mild progression in both lower lungs since 02/07/2019. She is currently receiving IV Decadron 6 mg daily, day 2. She is also on IV remdesivir, day 2. She will need to quarantine till 02/27/19.    Hypothyroidism Continue with Synthroid changed to oral today.   End-stage renal disease on dialysis Tuesday Thursday and Saturday. Volume management as per dialysis Nephrology on board.   Insulin-dependent diabetes mellitus with polyneuropathy and chronic kidney disease CBG (last 3)  Recent Labs    02/10/19 2232 02/11/19 0729 02/11/19 1117  GLUCAP 183* 75 115*  RESUME SSI and home dose of Lantus.  cbg's well controlled. Would not add linagliptin at this time.    Body mass index is 41.24 kg/m. Morbidly obese Increase the DVT prophylaxis to 7500 TID.     DVT prophylaxis: Lovenox Code Status: Full code Family Communication: None at bedside, will call his son to update later today Disposition Plan: pending improvement in hypoxia.    Consultants:   Nephrology  Procedures: None Antimicrobials: Aztreonam since admission  Subjective: No chest pain shortness of breath, nausea vomiting, abdominal pain  Objective: Vitals:   02/11/19 1215 02/11/19 1300 02/11/19 1315 02/11/19 1330  BP: 91/75 101/61  100/63  Pulse: 89 79 94   Resp: (!) 23 17 19    Temp:    98 F (36.7 C)  TempSrc:    Axillary  SpO2: 94% 99% 96%   Weight:    99 kg    Intake/Output Summary (Last 24 hours) at 02/11/2019 1444 Last data filed at 02/11/2019 1330 Gross per 24 hour  Intake 420 ml  Output 1400 ml  Net -  980 ml   Filed Weights   02/11/19 0100 02/11/19 1020 02/11/19 1330  Weight: 96.8 kg 100 kg 99 kg    Examination:  General exam: not in distress but is on 20 lit of HF oxygen.  Respiratory system: diminished air entry at bases, scattered rales. No wheezing heard.  Cardiovascular system: S1 & S2 heard, RRR. JVD cannot be appreciated. No pedal edema. Gastrointestinal system:  Abdomen is nondistended, soft and nontender. Normal bowel sounds heard. Central nervous system: Alert and oriented to place and person. Able to move all extremities, follows commands and answer questions appropriately.  Extremities: no pedal edema.  Skin: No rashes, Psychiatry:  Mood & affect appropriate.     Data Reviewed: I have personally reviewed following labs and imaging studies  CBC: Recent Labs  Lab 02/07/19 1430 02/07/19 1430 02/09/19 1946 02/09/19 1957 02/10/19 0235 02/10/19 1712 02/11/19 0730  WBC 6.5  --  6.9  --  7.1  --  4.5  NEUTROABS 4.2  --   --   --  6.3  --  3.0  HGB 10.1*   < > 10.1* 10.9* 9.8* 9.9* 9.6*  HCT 33.2*   < > 33.8* 32.0* 32.1* 29.0* 31.6*  MCV 97.4  --  96.3  --  97.0  --  96.3  PLT 176  --  144*  --  132*  --  132*   < > = values in this interval not displayed.   Basic Metabolic Panel: Recent Labs  Lab 02/07/19 1430 02/07/19 1430 02/09/19 1946 02/09/19 1957 02/10/19 0235 02/10/19 1712 02/11/19 0730  NA 132*   < > 136 135 135 132* 137  K 4.9   < > 4.1 3.9 4.4 5.0 4.6  CL 91*  --  92*  --  94*  --  94*  CO2 27  --  26  --  25  --  22  GLUCOSE 78  --  105*  --  119*  --  83  BUN 41*  --  15  --  18  --  37*  CREATININE 6.84*  --  4.04*  --  4.36*  --  6.01*  CALCIUM 8.2*  --  8.1*  --  8.3*  --  8.3*   < > = values in this interval not displayed.   GFR: Estimated Creatinine Clearance: 8.7 mL/min (A) (by C-G formula based on SCr of 6.01 mg/dL (H)). Liver Function Tests: Recent Labs  Lab 02/09/19 1946 02/10/19 0235 02/11/19 0730  AST 24 18 22   ALT 13 13 12   ALKPHOS 86 77 63  BILITOT 1.3* 1.4* 1.0  PROT 5.5* 5.3* 5.2*  ALBUMIN 2.7* 2.6* 2.5*   No results for input(s): LIPASE, AMYLASE in the last 168 hours. No results for input(s): AMMONIA in the last 168 hours. Coagulation Profile: No results for input(s): INR, PROTIME in the last 168 hours. Cardiac Enzymes: No results for input(s): CKTOTAL, CKMB, CKMBINDEX, TROPONINI in  the last 168 hours. BNP (last 3 results) No results for input(s): PROBNP in the last 8760 hours. HbA1C: No results for input(s): HGBA1C in the last 72 hours. CBG: Recent Labs  Lab 02/10/19 1450 02/10/19 1650 02/10/19 2232 02/11/19 0729 02/11/19 1117  GLUCAP 96 107* 183* 75 115*   Lipid Profile: No results for input(s): CHOL, HDL, LDLCALC, TRIG, CHOLHDL, LDLDIRECT in the last 72 hours. Thyroid Function Tests: No results for input(s): TSH, T4TOTAL, FREET4, T3FREE, THYROIDAB in the last 72 hours. Anemia Panel: Recent Labs  02/09/19 1946  FERRITIN 1,101*   Sepsis Labs: Recent Labs  Lab 02/09/19 1946 02/10/19 0235 02/11/19 0730  PROCALCITON 0.58 0.62 0.58    Recent Results (from the past 240 hour(s))  SARS CORONAVIRUS 2 (TAT 6-24 HRS) Nasopharyngeal Nasopharyngeal Swab     Status: Abnormal   Collection Time: 02/07/19  4:13 PM   Specimen: Nasopharyngeal Swab  Result Value Ref Range Status   SARS Coronavirus 2 POSITIVE (A) NEGATIVE Final    Comment: RESULT CALLED TO, READ BACK BY AND VERIFIED WITH: RN KIM SPRINGERS AT 2255 BY [place] ON 02/07/2019 (NOTE) SARS-CoV-2 target nucleic acids are DETECTED. The SARS-CoV-2 RNA is generally detectable in upper and lower respiratory specimens during the acute phase of infection. Positive results are indicative of the presence of SARS-CoV-2 RNA. Clinical correlation with patient history and other diagnostic information is  necessary to determine patient infection status. Positive results do not rule out bacterial infection or co-infection with other viruses.  The expected result is Negative. Fact Sheet for Patients: SugarRoll.be Fact Sheet for Healthcare Providers: https://www.woods-mathews.com/ This test is not yet approved or cleared by the [place] FDA and  has been authorized for detection and/or diagnosis of SARS-CoV-2 by FDA under an Emergency Use Authorization (EUA).  This EUA will remain  in effect (meaning this te st can be used) for the duration of the COVID-19 declaration under Section 564(b)(1) of the Act, 21 U.S.C. section 360bbb-3(b)(1), unless the authorization is terminated or revoked sooner. Performed at [facility], [place] [address] [place], [place] [address]   MRSA PCR Screening     Status: None   Collection Time: 02/11/19  1:21 AM   Specimen: Nasal Mucosa; Nasopharyngeal  Result Value Ref Range Status   MRSA by PCR NEGATIVE NEGATIVE Final    Comment:        The GeneXpert MRSA Assay (FDA approved for NASAL specimens only), is one component of a comprehensive MRSA colonization surveillance program. It is not intended to diagnose MRSA infection nor to guide or monitor treatment for MRSA infections. Performed at [facility], [place] [address], [place], [place] [address]          Radiology Studies: DG Chest Portable 1 View  Result Date: 02/09/2019 CLINICAL DATA:  74 year old female positive COVID-19. Shortness of breath. EXAM: PORTABLE CHEST 1 VIEW COMPARISON:  Portable chest 02/07/2019.  Chest CT 11/16/2018. FINDINGS: Portable AP semi upright view at 1843 hours. Stable right chest dialysis type catheter. Stable lung volumes and mediastinal contours. Asymmetric increased interstitial opacity is now most pronounced at the left lung base. There is also increased streaky medial right lung base opacity. No pneumothorax. No pleural effusion is evident. No acute osseous abnormality identified. IMPRESSION: Asymmetric pulmonary opacity compatible with COVID-19 pneumonia in this setting. Mild progression in both lower lungs since 02/07/2019. Electronically Signed   By: Genevie Ann M.D.   On: 02/09/2019 19:36        Scheduled Meds: . vitamin C  500 mg Oral Daily  . calcitRIOL  1.75 mcg Oral Q T,Th,Sa-HD  . Chlorhexidine Gluconate Cloth  6 each Topical Q0600  . clonazePAM  0.25 mg Oral Daily  . clonazePAM  0.5 mg Oral QHS  .  dexamethasone (DECADRON) injection  6 mg Intravenous Daily  . docusate sodium  100 mg Oral q morning - 10a  . feeding supplement (NEPRO CARB STEADY)  237 mL Oral BID BM  . FLUoxetine  10 mg Oral Daily  . heparin  5,000 Units Subcutaneous Q8H  . insulin aspart  0-5 Units Subcutaneous QHS  . insulin aspart  0-6 Units Subcutaneous TID WC  . insulin detemir  15 Units Subcutaneous QAC breakfast  . Ipratropium-Albuterol  2 puff Inhalation Q6H  . levothyroxine  56 mcg Intravenous Daily  . midodrine  10 mg Oral Q T,Th,Sa-HD  . montelukast  10 mg Oral QHS  . multivitamin  1 tablet Oral QHS  . rosuvastatin  40 mg Oral QHS  . senna  1 tablet Oral q morning - 10a  . sevelamer carbonate  1,600 mg Oral TID WC  . sodium chloride flush  3 mL Intravenous Q12H  . zinc sulfate  220 mg Oral Daily   Continuous Infusions: . aztreonam Stopped (02/10/19 2300)  . remdesivir 100 mg in NS 100 mL 100 mg (02/11/19 1348)     LOS: 2 days        Hosie Poisson, MD [facility]    02/11/2019, 2:44 PM

## 2019-02-11 NOTE — Progress Notes (Signed)
[facility] Progress Note  Subjective: looks about like yest, a little better perhaps. No sig c/o's. 1.4 L off w/ HD today  Vitals:   02/11/19 1300 02/11/19 1315 02/11/19 1330 02/11/19 1516  BP: 101/61  100/63   Pulse: 79 94    Resp: 17 19    Temp:   98 F (36.7 C) 98.7 F (37.1 C)  TempSrc:   Axillary Oral  SpO2: 99% 96%    Weight:   99 kg     Exam: Gen somnolent, arouses and answers simple questions and falls back asleep No rash, cyanosis or gangrene Sclera anicteric, throat clear  No jvd or bruits Chest clear bilat bilat, no wheezing RRR no MRG Abd soft ntnd no mass or ascites +bs obese GU defer MS no joint effusions or deformity Ext chronic 1-2+ distal pretib bilat edema, no wounds or ulcers Neuro is lethargic, nonfocal    Home meds:  - furosemide 80 qd/ midodrine 10 TTS pre hd  - sevelamer carb ac tid  - clonazepam 0.25 am and 0.50 pm/ fluoxetine 10 qd  - rosuvastatin 40 hs  - montelukast 10 hs/ levalbuterol tid prn/ home O2 3L  - levothyroxine 112 ug  - insulin detemir 15 qd/ lispro SSI tid    Outpt HD: TTS G-O    4h  400/800  94kg  2/2 bath  TDC R  - sensipar 60 po  - venofer 50 /wk  - mircera 75 ug q 2wk, last 1/14  - calc 1.75 ug tiw     Assessment/ Plan: 1. COVID + PNA - tested + on 1/12, will need to quarantine until 02/27/19. Getting IV steroids / remdesivir, and IV vanc / aztreonam for poss CAP 2. ESRD - on HD TTS at COVID shift. Had not missed HD. HD today w/ 1.4 L off, BP's dropped.  3. Hypotension/ volume - chronic hypotension on midodrine, is up 4-5 kg by wts, unable to pull much w/ hypotension. Will change midodrine from TTS to 10mg  bid daily.  4. DM 2 - on insulin 5. Anemia ckd - had esa yest 6. COPD /chron resp failure - on home O2 5L 7. Pall Care - pt is enrolled in a community-based Palliative Care program at home. Consider PC consult for [place].      Rob Analina Filla 02/11/2019, 4:17 PM  Inpatient medications: .  vitamin C  500 mg Oral Daily  . calcitRIOL  1.75 mcg Oral Q T,Th,Sa-HD  . Chlorhexidine Gluconate Cloth  6 each Topical Q0600  . clonazePAM  0.25 mg Oral Daily  . clonazePAM  0.5 mg Oral QHS  . dexamethasone (DECADRON) injection  6 mg Intravenous Daily  . docusate sodium  100 mg Oral q morning - 10a  . feeding supplement (NEPRO CARB STEADY)  237 mL Oral BID BM  . FLUoxetine  10 mg Oral Daily  . heparin  7,500 Units Subcutaneous Q8H  . insulin aspart  0-5 Units Subcutaneous QHS  . insulin aspart  0-6 Units Subcutaneous TID WC  . insulin detemir  15 Units Subcutaneous QAC breakfast  . Ipratropium-Albuterol  2 puff Inhalation Q6H  . [START ON 02/12/2019] levothyroxine  112 mcg Oral QAC breakfast  . midodrine  10 mg Oral Q T,Th,Sa-HD  . montelukast  10 mg Oral QHS  . multivitamin  1 tablet Oral QHS  . rosuvastatin  40 mg Oral QHS  . senna  1 tablet Oral q morning - 10a  . sevelamer carbonate  1,600 mg  Oral TID WC  . sodium chloride flush  3 mL Intravenous Q12H  . zinc sulfate  220 mg Oral Daily   . aztreonam 0.5 g (02/11/19 1517)  . remdesivir 100 mg in NS 100 mL 100 mg (02/11/19 1348)   acetaminophen, benzonatate, guaiFENesin-dextromethorphan, oxymetazoline, sevelamer carbonate, sodium chloride flush

## 2019-02-12 ENCOUNTER — Inpatient Hospital Stay (HOSPITAL_COMMUNITY): Payer: Medicare Other

## 2019-02-12 LAB — CBC WITH DIFFERENTIAL/PLATELET
Abs Immature Granulocytes: 0.14 10*3/uL — ABNORMAL HIGH (ref 0.00–0.07)
Basophils Absolute: 0 10*3/uL (ref 0.0–0.1)
Basophils Relative: 0 %
Eosinophils Absolute: 0 10*3/uL (ref 0.0–0.5)
Eosinophils Relative: 0 %
HCT: 30.6 % — ABNORMAL LOW (ref 36.0–46.0)
Hemoglobin: 9.2 g/dL — ABNORMAL LOW (ref 12.0–15.0)
Immature Granulocytes: 3 %
Lymphocytes Relative: 21 %
Lymphs Abs: 1 10*3/uL (ref 0.7–4.0)
MCH: 29.1 pg (ref 26.0–34.0)
MCHC: 30.1 g/dL (ref 30.0–36.0)
MCV: 96.8 fL (ref 80.0–100.0)
Monocytes Absolute: 0.3 10*3/uL (ref 0.1–1.0)
Monocytes Relative: 7 %
Neutro Abs: 3.1 10*3/uL (ref 1.7–7.7)
Neutrophils Relative %: 69 %
Platelets: 162 10*3/uL (ref 150–400)
RBC: 3.16 MIL/uL — ABNORMAL LOW (ref 3.87–5.11)
RDW: 17.2 % — ABNORMAL HIGH (ref 11.5–15.5)
WBC: 4.6 10*3/uL (ref 4.0–10.5)
nRBC: 0 % (ref 0.0–0.2)

## 2019-02-12 LAB — COMPREHENSIVE METABOLIC PANEL
ALT: 15 U/L (ref 0–44)
AST: 22 U/L (ref 15–41)
Albumin: 2.3 g/dL — ABNORMAL LOW (ref 3.5–5.0)
Alkaline Phosphatase: 67 U/L (ref 38–126)
Anion gap: 14 (ref 5–15)
BUN: 22 mg/dL (ref 8–23)
CO2: 26 mmol/L (ref 22–32)
Calcium: 8.4 mg/dL — ABNORMAL LOW (ref 8.9–10.3)
Chloride: 96 mmol/L — ABNORMAL LOW (ref 98–111)
Creatinine, Ser: 4.11 mg/dL — ABNORMAL HIGH (ref 0.44–1.00)
GFR calc Af Amer: 12 mL/min — ABNORMAL LOW (ref >60)
GFR calc non Af Amer: 10 mL/min — ABNORMAL LOW (ref >60)
Glucose, Bld: 97 mg/dL (ref 70–99)
Potassium: 4.2 mmol/L (ref 3.5–5.1)
Sodium: 136 mmol/L (ref 135–145)
Total Bilirubin: 0.8 mg/dL (ref 0.3–1.2)
Total Protein: 5.1 g/dL — ABNORMAL LOW (ref 6.5–8.1)

## 2019-02-12 LAB — LACTATE DEHYDROGENASE: LDH: 186 U/L (ref 98–192)

## 2019-02-12 LAB — GLUCOSE, CAPILLARY
Glucose-Capillary: 80 mg/dL (ref 70–99)
Glucose-Capillary: 75 mg/dL (ref 70–99)
Glucose-Capillary: 100 mg/dL — ABNORMAL HIGH (ref 70–99)
Glucose-Capillary: 102 mg/dL — ABNORMAL HIGH (ref 70–99)

## 2019-02-12 LAB — PROCALCITONIN: Procalcitonin: 0.57 ng/mL

## 2019-02-12 LAB — C-REACTIVE PROTEIN: CRP: 6.7 mg/dL — ABNORMAL HIGH (ref <1.0)

## 2019-02-12 LAB — D-DIMER, QUANTITATIVE: D-Dimer, Quant: 1.28 ug/mL-FEU — ABNORMAL HIGH (ref 0.00–0.50)

## 2019-02-12 MED ORDER — CLONAZEPAM 0.25 MG PO TBDP
0.2500 mg | ORAL_TABLET | Freq: Every day | ORAL | Status: DC
  Administered 2019-02-12 – 2019-02-22 (×10): 0.25 mg via ORAL
  Filled 2019-02-12 (×2): qty 1
  Filled 2019-02-12: qty 2
  Filled 2019-02-12 (×2): qty 1
  Filled 2019-02-12: qty 2
  Filled 2019-02-12 (×5): qty 1

## 2019-02-12 MED ORDER — CLONAZEPAM 0.5 MG PO TABS: 0.2500 mg | ORAL_TABLET | Freq: Every day | ORAL | Status: DC

## 2019-02-12 MED ORDER — IPRATROPIUM-ALBUTEROL 20-100 MCG/ACT IN AERS
2.0000 | INHALATION_SPRAY | Freq: Three times a day (TID) | RESPIRATORY_TRACT | Status: DC
  Administered 2019-02-12 – 2019-02-21 (×26): 2 via RESPIRATORY_TRACT
  Filled 2019-02-12 (×2): qty 4

## 2019-02-12 NOTE — Consult Note (Addendum)
Consultation Note Date: 02/12/2019   Patient Name: Joan Mosley  DOB: [birth year]  MRN: [id number]  Age / Sex: 74 y.o., female  PCP: Nche, Charlene Brooke, NP Referring Physician: Hosie Poisson, MD  Reason for Consultation: Establishing goals of care  HPI/Patient Profile: Joan Mosley a 74 y.o.femalewith medical history significant of ESRD on HD, COPD, chronic respiratory failure on 5 L home oxygen, type 2 diabetes, TIA, hypothyroidism presenting to the ED from her dialysis center for evaluation of hypoxia. Patient tested positive for COVID-19 on 1/12. Dialysis today she had 20 minutes left of her therapy when she was noted to be hypoxic. Oxygen saturation 82% on 2 L oxygen. Patient is AAO x3. States she recently tested positive for COVID-19. Her only complaints are generalized weakness/fatigue, chills, and body aches. Denies chest pain, shortness of breath, cough, nausea, vomiting, abdominal pain, or diarrhea. States she goes for dialysis every Tuesday, Thursday, and Saturday. She was brought here from her dialysis center. No additional history could be obtained from her.  Clinical Assessment and Goals of Care: [name] is well known to the Palliative care team.She was first evaluated in August after enduring a subdural hematoma. Less than two weeks later she was hospitalized for hypoxemic respiratory failure and again, seen by our service at which time she made a DNR/DNI. This past October patient again had an episode of hypoxemic respiratory failure during which time she was thought to have limited time left, was continued as a DNAR/DNI. Her goal at that time was to make it through the holidays.  I have reviewed current medical records including EPIC notes, labs and imaging. Spoke to patients bedside RN who stated that patient is presently  close to home O2 requirements at 6LPM.  Patient has  been admitted to the hospital seven times in the last six months.   Met with [name] at bedside. I introduced Palliative Medicine as specialized medical care for people living with serious illness. It focuses on providing relief from the symptoms and stress of a serious illness. The goal is to improve quality of life for both the patient and the family.  Asked [name] what brought her into the hospital, she told me that she was not breathing well. She has been identified as COVID-19 (+). We talked about how she comes from [place] and was hoping to move to [place] many months ago but her health was doing poorly so her family decided to rent a trailer here to stay. She said that she enjoys spending time with her family, but most enjoys her Insurance risk surveyor, [place]. Patient lives with her son and daughter who provide assistance with all bADLs.   Broached the topic of code status for which the patient said, "I want to live." We discussed her prior hospitalization and how she had been made DNR but this hospital stay she changed her mind. Inquired as to what made her change her mind. Patient told me that she received CPR at her dialysis clinic and improved after therefore feels  like she would want a chance.  Spoke to patients son and daughter who endorsed that they do everything they can for the patient. They stated that they would like to respect her wishes to be full code for the time being.  Called patients son, [name] we talked at length about her illness. He said that presently she wants a trial of everything and if she is unconscious he is happy to make the decisions thereafter. He continued to talk about the difficulties the family has faced with her dialysis clinic and how they feel the patient is never placed on the appropriate amount of oxygen there leading to hospitalizations such as these. He too is agreeable to OP Palliative follow up and would like to be updated if anything in her  care changes.   Decision Maker: Patient is able to speak for herself If unable to speak for self patients son, [name] is her POA - [phone]  SUMMARY OF RECOMMENDATIONS   Full Code  OP Palliative Care Services  Code Status/Advance Care Planning:  Full code   Symptom Management:   Per Attending Team  Palliative Prophylaxis:   Aspiration, Bowel Regimen, Delirium Protocol, Eye Care, Frequent Pain Assessment, Oral Care and Turn Reposition  Additional Recommendations (Limitations, Scope, Preferences):  Full Scope Treatment  Psycho-social/Spiritual:   Desire for further Chaplaincy support: No  Additional Recommendations: Caregiving  Support/Resources  Prognosis:   < 6 months, end stage COPD increased hospitalizations  Discharge Planning: Home with Palliative Services     Primary Diagnoses: Present on Admission: . Pneumonia due to COVID-19 virus . ESRD (end stage renal disease) ([place]) . COVID-19 I have reviewed the medical record, interviewed the patient and family, and examined the patient. The following aspects are pertinent.  Past Medical History:  Diagnosis Date  . Arthritis   . COPD (chronic obstructive pulmonary disease) ([place])   . Diabetes mellitus without complication ([place])   . Diabetic neuropathy ([place])   . 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 Renal disorder    ESRD  . Thyroid disease   . TIA (transient ischemic attack)    Social History   Socioeconomic History  . Marital status: Widowed    Spouse name: Not on file  . Number of children: 5  . Years of education: Not on file  . Highest education level: Not on file  Occupational History    Comment: disabled  Tobacco Use  . Smoking status: Former Smoker    Packs/day: 1.00    Years: 35.00    Pack years: 35.00    Quit date: 03/29/1981    Years since quitting: 37.9  . Smokeless tobacco: Never Used  Substance and Sexual Activity  . Alcohol use: Never  . Drug use: Never  .  Sexual activity: Not Currently  Other Topics Concern  . Not on file  Social History Narrative  . Not on file   Social Determinants of Health   Financial Resource Strain:   . Difficulty of Paying Living Expenses: Not on file  Food Insecurity:   . Worried About Charity fundraiser in the Last Year: Not on file  . Ran Out of Food in the Last Year: Not on file  Transportation Needs:   . Lack of Transportation (Medical): Not on file  . Lack of Transportation (Non-Medical): Not on file  Physical Activity:   . Days of Exercise per Week: Not on file  . Minutes of Exercise per Session: Not on  file  Stress:   . Feeling of Stress : Not on file  Social Connections:   . Frequency of Communication with Friends and Family: Not on file  . Frequency of Social Gatherings with Friends and Family: Not on file  . Attends Religious Services: Not on file  . Active Member of Clubs or Organizations: Not on file  . Attends Archivist Meetings: Not on file  . Marital Status: Not on file   Family History  Problem Relation Age of Onset  . Heart disease Mother   . Hypertension Mother   . Heart disease Father   . Hypertension Father    Scheduled Meds: . vitamin C  500 mg Oral Daily  . calcitRIOL  1.75 mcg Oral Q T,Th,Sa-HD  . Chlorhexidine Gluconate Cloth  6 each Topical Q0600  . clonazePAM  0.25 mg Oral Daily  . clonazePAM  0.25 mg Oral QHS  . dexamethasone (DECADRON) injection  6 mg Intravenous Daily  . docusate sodium  100 mg Oral q morning - 10a  . feeding supplement (NEPRO CARB STEADY)  237 mL Oral BID BM  . FLUoxetine  10 mg Oral Daily  . heparin  7,500 Units Subcutaneous Q8H  . insulin aspart  0-5 Units Subcutaneous QHS  . insulin aspart  0-6 Units Subcutaneous TID WC  . insulin detemir  15 Units Subcutaneous QAC breakfast  . Ipratropium-Albuterol  2 puff Inhalation TID  . levothyroxine  112 mcg Oral QAC breakfast  . midodrine  10 mg Oral BID  . montelukast  10 mg Oral QHS  .  multivitamin  1 tablet Oral QHS  . rosuvastatin  40 mg Oral QHS  . senna  1 tablet Oral q morning - 10a  . sevelamer carbonate  1,600 mg Oral TID WC  . sodium chloride flush  3 mL Intravenous Q12H  . zinc sulfate  220 mg Oral Daily   Continuous Infusions: . remdesivir 100 mg in NS 100 mL 100 mg (02/12/19 0940)   PRN Meds:.acetaminophen, benzonatate, guaiFENesin-dextromethorphan, oxymetazoline, sevelamer carbonate, sodium chloride flush Medications Prior to Admission:  Prior to Admission medications   Medication Sig Start Date End Date Taking? Authorizing Provider  acetaminophen (TYLENOL) 500 MG tablet Take 1,000 mg by mouth every 6 (six) hours as needed (for arthritic pain).    Yes [provider]  benzonatate (TESSALON) 200 MG capsule Take 1 capsule (200 mg total) by mouth 2 (two) times daily as needed for cough. 02/08/19  Yes Nche, Charlene Brooke, NP  clonazePAM (KLONOPIN) 0.5 MG tablet 0.58m in AM and 0.577min PM Patient taking differently: Take 0.25-0.5 mg by mouth 2 (two) times daily. 0.2534mn AM and 0.5mg72m PM 02/08/19  Yes Nche, CharCharlene Brooke  Darbepoetin Alfa (ARANESP) 25 MCG/0.42ML SOSY injection Inject 0.42 mLs (25 mcg total) into the vein every Tuesday with hemodialysis. 11/22/18  Yes AkulHosie Poisson  docusate sodium (COLACE) 100 MG capsule Take 100 mg by mouth every morning.   Yes [provider]  FLUoxetine (PROZAC) 10 MG tablet Take 1 tablet (10 mg total) by mouth daily. 02/08/19  Yes Nche, CharCharlene Brooke  furosemide (LASIX) 40 MG tablet Take 80 mg by mouth See admin instructions. Take 80 mg by mouth in the morning on Sun/Mon/Wed/Fri (non-dialysis days) 10/13/18  Yes [provider]  guaiFENesin-dextromethorphan (ROBITUSSIN DM) 100-10 MG/5ML syrup Take 20 mLs by mouth every 4 (four) hours as needed for cough.   Yes [provider]  insulin detemir (  LEVEMIR) 100 UNIT/ML injection Inject 0.1 mLs (10 Units total) into the skin  daily. Patient taking differently: Inject 15 Units into the skin daily before breakfast.  11/22/18  Yes Hosie Poisson, MD  insulin lispro (HUMALOG) 100 UNIT/ML injection Inject 1-2 Units into the skin See admin instructions. Inject 1-2 units into the skin three times a day before meals, per sliding scale: 1 unit for every 50 points above a BGL of 150   Yes [provider]  ipratropium-albuterol (DUONEB) 0.5-2.5 (3) MG/3ML SOLN Take 3 mLs by nebulization See admin instructions. Nebulize and inhale 3 ml's into the lungs every four hours, while awake   Yes [provider]  levothyroxine (SYNTHROID) 112 MCG tablet Take 1 tablet (112 mcg total) by mouth daily before breakfast. 1 AM 02/08/19  Yes Nche, Charlene Brooke, NP  Lubricants (K-Y LUBRICANT JELLY SENSITIVE EX) Place 1 application into both nostrils as needed (as directed- for lubrication).    Yes [provider]  midodrine (PROAMATINE) 10 MG tablet Take 10 mg by mouth See admin instructions. Take 10 mg by mouth on Tuesday, Thursday, Saturday 45 minutes before dialysis   Yes [provider]  montelukast (SINGULAIR) 10 MG tablet Take 10 mg by mouth at bedtime.    Yes [provider]  multivitamin (RENA-VIT) TABS tablet Take 1 tablet by mouth at bedtime. 11/21/18  Yes Hosie Poisson, MD  NASAL SPRAY SALINE NA Place 1-2 sprays into both nostrils as needed (for congestion- "[employer] Simply Saline" brand).   Yes [provider]  Nutritional Supplements (FEEDING SUPPLEMENT, NEPRO CARB STEADY,) LIQD Take 237 mLs by mouth 2 (two) times daily between meals. 11/22/18  Yes Hosie Poisson, MD  nystatin (MYCOSTATIN/NYSTOP) powder Apply 1 g topically 2 (two) times daily as needed (as directed- to any rashes).    Yes [provider]  nystatin cream (MYCOSTATIN) Apply 1 application topically 2 (two) times daily as needed for dry skin.    Yes [provider]  OVER THE COUNTER MEDICATION Stool  softner otc   Yes [provider]  OXYGEN Inhale 5-6 L/min into the lungs continuous.   Yes [provider]  oxymetazoline (AFRIN) 0.05 % nasal spray Place 1 spray into both nostrils 2 (two) times daily as needed for congestion.   Yes [provider]  rosuvastatin (CRESTOR) 40 MG tablet Take 1 tablet (40 mg total) by mouth at bedtime. 02/08/19  Yes Nche, Charlene Brooke, NP  senna (SENOKOT) 8.6 MG TABS tablet Take 1 tablet by mouth every morning.   Yes [provider]  sevelamer carbonate (RENVELA) 800 MG tablet Take 2 tablets (1,600 mg total) by mouth 3 (three) times daily with meals. Patient taking differently: Take 800-1,600 mg by mouth See admin instructions. Take 1,600 mg by mouth three times a day with meals and 800-1,600 mg with each snack 11/21/18  Yes Hosie Poisson, MD  albuterol (PROVENTIL) (2.5 MG/3ML) 0.083% nebulizer solution Take 3 mLs (2.5 mg total) by nebulization 4 (four) times daily. Patient not taking: Reported on 02/09/2019 09/16/18   Mariel Aloe, MD  albuterol (VENTOLIN HFA) 108 (90 Base) MCG/ACT inhaler Inhale 2 puffs into the lungs every 6 (six) hours as needed for wheezing or shortness of breath. 02/10/19   Nche, Charlene Brooke, NP  calcitRIOL (ROCALTROL) 0.25 MCG capsule Take 7 capsules (1.75 mcg total) by mouth Every Tuesday,Thursday,and Saturday with dialysis. 11/22/18   Hosie Poisson, MD  cinacalcet (SENSIPAR) 30 MG tablet Take 2 tablets (60 mg  total) by mouth Every Tuesday,Thursday,and Saturday with dialysis. Patient not taking: Reported on 02/09/2019 11/22/18   Hosie Poisson, MD  lidocaine (LIDODERM) 5 % Place 1 patch onto the skin every 12 (twelve) hours as needed (back pain). Remove & Discard patch within 12 hours or as directed by MD     [provider]   Allergies  Allergen Reactions  . Avelox [Moxifloxacin Hcl In Nacl] Other (See Comments)    Unknown reaction  . Codeine Anaphylaxis  . Penicillins Rash    Did it involve  swelling of the face/tongue/throat, SOB, or low BP? Unk Did it involve sudden or severe rash/hives, skin peeling, or any reaction on the inside of your mouth or nose? Yes Did you need to seek medical attention at a hospital or doctor's office? Unk When did it last happen? Unk If all above answers are "NO", may proceed with cephalosporin use.   . Sulfa Antibiotics Other (See Comments)    Unknown reaction  . Dextromethorphan-Guaifenesin Other (See Comments)    Reported by [facility] - unknown reaction  . Shellfish Allergy Hives, Itching and Rash   Review of Systems  Respiratory: Positive for shortness of breath.   All other systems reviewed and are negative.  Physical Exam Vitals and nursing note reviewed.  HENT:     Head: Normocephalic.     Nose: Nose normal.     Mouth/Throat:     Mouth: Mucous membranes are moist.  Eyes:     Pupils: Pupils are equal, round, and reactive to light.  Musculoskeletal:     Cervical back: Normal range of motion.  Neurological:     Mental Status: She is alert.  Vital Signs: BP (!) 113/58   Pulse 66   Temp 97.8 F (36.6 C) (Oral)   Resp 17   Wt 99 kg   SpO2 (!) 89%   BMI 41.24 kg/m  Pain Scale: 0-10   Pain Score: 0-No pain  SpO2: SpO2: (!) 89 % O2 Device:SpO2: (!) 89 % O2 Flow Rate: .O2 Flow Rate (L/min): 8 L/min  IO: Intake/output summary:   Intake/Output Summary (Last 24 hours) at 02/12/2019 1803 Last data filed at 02/12/2019 1153 Gross per 24 hour  Intake 610 ml  Output --  Net 610 ml    LBM: Last BM Date: 02/09/19 Baseline Weight: Weight: 96.8 kg Most recent weight: Weight: 99 kg     Palliative Assessment/Data: 40% Time In: 1700 Time Out: 1810 Time Total: 75 Greater than 50%  of this time was spent counseling and coordinating care related to the above assessment and plan.  Signed by: Rosezella Rumpf, NP   Please contact Palliative Medicine Team phone at [phone] for questions and concerns.  For individual provider:  See Shea Evans

## 2019-02-12 NOTE — Progress Notes (Signed)
PROGRESS NOTE    Joan Mosley  [id number] DOB: [birth year] DOA: 02/09/2019 PCP: Flossie Buffy, NP    Brief Narrative: 74 year old lady with prior history of end-stage renal disease on dialysis, COPD,, chronic respiratory failure on 5 L of home oxygen, type 2 diabetes mellitus, hypothyroidism, TIA presented to ED from dialysis for evaluation of hypoxia.  Patient was tested positive for COVID-19 on 02/07/2019 and 20 minutes into her dialysis she was noted to be hypoxic and was brought to ED for further evaluation.  On arrival to ED patient was saturating 88% on nonrebreather and she was switched to 20 L of high flow oxygen and sats have been around 92%.  Chest x-ray showed asymmetric pulmonary opacities compatible with COVID-19 pneumonia mildly progressed in the lower lungs compared to prior chest x-ray from 2 days ago. She is now admitted for further evaluation and management on acute on chronic respiratory failure with hypoxia secondary to COVID-19 multifocal pneumonia. Nephrology consulted for dialysis and plan for dialysis . On further discussion with the patient's son it looks like she missed a session of HD.  Assessment & Plan:   Principal Problem:   Pneumonia due to COVID-19 virus Active Problems:   Type 2 diabetes mellitus with diabetic polyneuropathy, with long-term current use of insulin (HCC)   ESRD (end stage renal disease) (HCC)   Acute on chronic respiratory failure with hypoxia (HCC)   COPD (chronic obstructive pulmonary disease) ([place])   COVID-19   Acute on chronic respiratory failure with hypoxia secondary to COVID-19 multifocal pneumonia Patient tested positive for Covid on 02/07/2019.   Patient is currently requiring up to 12 L of high flow oxygen and sats have been around 95%.  She is alert and answering all questions appropriately. Continue with incentive spirometry and flutter valve. MRSA PCR is negative.,  CRP improving, procalcitonin still at 0.5.  Chest  x-ray shows improving infiltrates.  Post hemodialysis. We will DC aztreonam. She is currently receiving IV Decadron 6 mg daily, day 3. She is also on IV remdesivir, day 3. She will need to quarantine till 02/27/19.   Anemia of chronic disease Probably secondary to end-stage renal disease. Hemoglobin stable around 9   Hypothyroidism Continue with Synthroid   End-stage renal disease on dialysis Tuesday Thursday and Saturday. Volume management as per dialysis Nephrology on board.   Insulin-dependent diabetes mellitus with polyneuropathy and chronic kidney disease CBG (last 3)  Recent Labs    02/12/19 0709 02/12/19 1102 02/12/19 1628  GLUCAP 80 75 100*   CBGs are well controlled at this time.   Body mass index is 41.24 kg/m. Morbidly obese Increase the DVT prophylaxis to 7500 TID.   Hyperlipidemia Continue with Crestor.  DVT prophylaxis: Heparin Code Status: Full code Family Communication: None at bedside, discussed with son on 02/11/2019. Disposition Plan: Pending improvement in hypoxia.   Consultants:   Nephrology  Procedures: None Antimicrobials: Aztreonam since admission  Subjective: Patient reports feeling better, denies any nausea, vomiting or abdominal pain.  Patient reports breathing is good.  No chest pain Patient had coughing spells.  Objective: Vitals:   02/12/19 1436 02/12/19 1504 02/12/19 1600 02/12/19 1700  BP:   117/61 (!) 113/58  Pulse: 74  (!) 178 66  Resp: 16  15 17   Temp:  97.8 F (36.6 C)    TempSrc:  Oral    SpO2: 92%  95% (!) 89%  Weight:        Intake/Output Summary (Last 24 hours) at 02/12/2019 1744 Last  data filed at 02/12/2019 1153 Gross per 24 hour  Intake 610 ml  Output --  Net 610 ml   Filed Weights   02/11/19 0100 02/11/19 1020 02/11/19 1330  Weight: 96.8 kg 100 kg 99 kg    Examination:  General exam alert and comfortable on 12 L of high flow oxygen.  Respiratory system: Diminished air entry at bases,  tachypnea present, no rhonchi Cardiovascular system: S1-S2 heard, regular rate rhythm Gastrointestinal system: Abdomen is soft, nontender, nondistended, bowel sounds heard Central nervous system: Alert and oriented to place and person and following commands and answering questions appropriately. Extremities: No pedal edema Skin: No rashes Psychiatry: Mood is appropriate    Data Reviewed: I have personally reviewed following labs and imaging studies  CBC: Recent Labs  Lab 02/07/19 1430 02/07/19 1430 02/09/19 1946 02/09/19 1946 02/09/19 1957 02/10/19 0235 02/10/19 1712 02/11/19 0730 02/12/19 0349  WBC 6.5  --  6.9  --   --  7.1  --  4.5 4.6  NEUTROABS 4.2  --   --   --   --  6.3  --  3.0 3.1  HGB 10.1*   < > 10.1*   < > 10.9* 9.8* 9.9* 9.6* 9.2*  HCT 33.2*   < > 33.8*   < > 32.0* 32.1* 29.0* 31.6* 30.6*  MCV 97.4  --  96.3  --   --  97.0  --  96.3 96.8  PLT 176  --  144*  --   --  132*  --  132* 162   < > = values in this interval not displayed.   Basic Metabolic Panel: Recent Labs  Lab 02/07/19 1430 02/07/19 1430 02/09/19 1946 02/09/19 1946 02/09/19 1957 02/10/19 0235 02/10/19 1712 02/11/19 0730 02/12/19 0349  NA 132*   < > 136   < > 135 135 132* 137 136  K 4.9   < > 4.1   < > 3.9 4.4 5.0 4.6 4.2  CL 91*  --  92*  --   --  94*  --  94* 96*  CO2 27  --  26  --   --  25  --  22 26  GLUCOSE 78  --  105*  --   --  119*  --  83 97  BUN 41*  --  15  --   --  18  --  37* 22  CREATININE 6.84*  --  4.04*  --   --  4.36*  --  6.01* 4.11*  CALCIUM 8.2*  --  8.1*  --   --  8.3*  --  8.3* 8.4*   < > = values in this interval not displayed.   GFR: Estimated Creatinine Clearance: 12.8 mL/min (A) (by C-G formula based on SCr of 4.11 mg/dL (H)). Liver Function Tests: Recent Labs  Lab 02/09/19 1946 02/10/19 0235 02/11/19 0730 02/12/19 0349  AST 24 18 22 22   ALT 13 13 12 15   ALKPHOS 86 77 63 67  BILITOT 1.3* 1.4* 1.0 0.8  PROT 5.5* 5.3* 5.2* 5.1*  ALBUMIN 2.7* 2.6*  2.5* 2.3*   No results for input(s): LIPASE, AMYLASE in the last 168 hours. No results for input(s): AMMONIA in the last 168 hours. Coagulation Profile: No results for input(s): INR, PROTIME in the last 168 hours. Cardiac Enzymes: No results for input(s): CKTOTAL, CKMB, CKMBINDEX, TROPONINI in the last 168 hours. BNP (last 3 results) No results for input(s): PROBNP in the last 8760 hours. HbA1C: No  results for input(s): HGBA1C in the last 72 hours. CBG: Recent Labs  Lab 02/11/19 1117 02/11/19 2125 02/12/19 0709 02/12/19 1102 02/12/19 1628  GLUCAP 115* 164* 80 75 100*   Lipid Profile: No results for input(s): CHOL, HDL, LDLCALC, TRIG, CHOLHDL, LDLDIRECT in the last 72 hours. Thyroid Function Tests: No results for input(s): TSH, T4TOTAL, FREET4, T3FREE, THYROIDAB in the last 72 hours. Anemia Panel: Recent Labs    02/09/19 1946  FERRITIN 1,101*   Sepsis Labs: Recent Labs  Lab 02/09/19 1946 02/10/19 0235 02/11/19 0730 02/12/19 0349  PROCALCITON 0.58 0.62 0.58 0.57    Recent Results (from the past 240 hour(s))  SARS CORONAVIRUS 2 (TAT 6-24 HRS) Nasopharyngeal Nasopharyngeal Swab     Status: Abnormal   Collection Time: 02/07/19  4:13 PM   Specimen: Nasopharyngeal Swab  Result Value Ref Range Status   SARS Coronavirus 2 POSITIVE (A) NEGATIVE Final    Comment: RESULT CALLED TO, READ BACK BY AND VERIFIED WITH: RN KIM SPRINGERS AT 2255 BY Lindy ON 02/07/2019 (NOTE) SARS-CoV-2 target nucleic acids are DETECTED. The SARS-CoV-2 RNA is generally detectable in upper and lower respiratory specimens during the acute phase of infection. Positive results are indicative of the presence of SARS-CoV-2 RNA. Clinical correlation with patient history and other diagnostic information is  necessary to determine patient infection status. Positive results do not rule out bacterial infection or co-infection with other viruses.  The expected result is Negative. Fact Sheet for  Patients: SugarRoll.be Fact Sheet for Healthcare Providers: https://www.woods-mathews.com/ This test is not yet approved or cleared by the [place] FDA and  has been authorized for detection and/or diagnosis of SARS-CoV-2 by FDA under an Emergency Use Authorization (EUA). This EUA will remain  in effect (meaning this te st can be used) for the duration of the COVID-19 declaration under Section 564(b)(1) of the Act, 21 U.S.C. section 360bbb-3(b)(1), unless the authorization is terminated or revoked sooner. Performed at [facility], [place] [address] [place], [place] [address]   MRSA PCR Screening     Status: None   Collection Time: 02/11/19  1:21 AM   Specimen: Nasal Mucosa; Nasopharyngeal  Result Value Ref Range Status   MRSA by PCR NEGATIVE NEGATIVE Final    Comment:        The GeneXpert MRSA Assay (FDA approved for NASAL specimens only), is one component of a comprehensive MRSA colonization surveillance program. It is not intended to diagnose MRSA infection nor to guide or monitor treatment for MRSA infections. Performed at [facility], [place] [address] [place], [place] [address]          Radiology Studies: DG CHEST PORT 1 VIEW  Result Date: 02/12/2019 CLINICAL DATA:  Followup COVID pneumonia. EXAM: PORTABLE CHEST 1 VIEW COMPARISON:  02/09/2019 FINDINGS: The right IJ dialysis catheter is stable. The heart is normal in size and stable. There is moderate tortuosity and calcification of the thoracic aorta. Improved lung aeration with clearing infiltrates. There is a persistent small right pleural effusion and overlying right lower lobe atelectasis or infiltrate. IMPRESSION: Improving lung aeration with clearing infiltrates. Persistent small right effusion and overlying atelectasis or infiltrate. Electronically Signed   By: Marijo Sanes M.D.   On: 02/12/2019 09:13        Scheduled Meds: . vitamin C  500 mg Oral  Daily  . calcitRIOL  1.75 mcg Oral Q T,Th,Sa-HD  . Chlorhexidine Gluconate Cloth  6 each Topical Q0600  . clonazePAM  0.25 mg  Oral Daily  . clonazePAM  0.5 mg Oral QHS  . dexamethasone (DECADRON) injection  6 mg Intravenous Daily  . docusate sodium  100 mg Oral q morning - 10a  . feeding supplement (NEPRO CARB STEADY)  237 mL Oral BID BM  . FLUoxetine  10 mg Oral Daily  . heparin  7,500 Units Subcutaneous Q8H  . insulin aspart  0-5 Units Subcutaneous QHS  . insulin aspart  0-6 Units Subcutaneous TID WC  . insulin detemir  15 Units Subcutaneous QAC breakfast  . Ipratropium-Albuterol  2 puff Inhalation TID  . levothyroxine  112 mcg Oral QAC breakfast  . midodrine  10 mg Oral BID  . montelukast  10 mg Oral QHS  . multivitamin  1 tablet Oral QHS  . rosuvastatin  40 mg Oral QHS  . senna  1 tablet Oral q morning - 10a  . sevelamer carbonate  1,600 mg Oral TID WC  . sodium chloride flush  3 mL Intravenous Q12H  . zinc sulfate  220 mg Oral Daily   Continuous Infusions: . aztreonam 0.5 g (02/12/19 1153)  . remdesivir 100 mg in NS 100 mL 100 mg (02/12/19 0940)     LOS: 3 days        Hosie Poisson, MD [facility]    02/12/2019, 5:44 PM

## 2019-02-12 NOTE — Progress Notes (Signed)
[facility] Progress Note  Subjective: 1.4 L off yest on HD. Resting, lying at 30 deg, no resp c/o's, on nasal O2.    Vitals:   02/12/19 0810 02/12/19 0900 02/12/19 1103 02/12/19 1223  BP:  (!) 104/58    Pulse: 80 76  74  Resp: (!) 23 17  19   Temp:   97.6 F (36.4 C)   TempSrc:   Oral   SpO2: 98% 98%  95%  Weight:        Exam: Gen lethargic but arouse to voice No jvd or bruits Chest clear bilat bilat RRR no MRG Abd soft ntnd no mass or ascites +bs obese Ext chronic 1+ distal pretib bilat edema Neuro is lethargic, nonfocal    Home meds:  - furosemide 80 qd/ midodrine 10 TTS pre hd  - sevelamer carb ac tid  - clonazepam 0.25 am and 0.50 pm/ fluoxetine 10 qd  - rosuvastatin 40 hs  - montelukast 10 hs/ levalbuterol tid prn/ home O2 3L  - levothyroxine 112 ug  - insulin detemir 15 qd/ lispro SSI tid    Outpt HD: TTS G-O    4h  400/800  94kg  2/2 bath  TDC R  - sensipar 60 po  - venofer 50 /wk  - mircera 75 ug q 2wk, last 1/14  - calc 1.75 ug tiw     Assessment/ Plan: 1. COVID + PNA - tested + on 1/12, will need to quarantine until 02/27/19. Getting IV steroids / remdesivir and IV vanc / aztreonam for poss CAP 2. ESRD - on HD TTS at COVID shift. Had not missed HD. Next HD 1/19 3. Hypotension/ volume - chronic hypotension on midodrine, is up 4-5 kg by wts, unable to pull much w/ hypotension. Changed midodrine from pre HD to 10mg  bid daily.  4. DM 2 - on insulin 5. Anemia ckd - had esa yest 6. COPD /chron resp failure - on home O2 5L 7. Pall Care - pt is enrolled in a community-based Palliative Care program at home. Consider PC consult for [place].      Joan Mosley Bonsall 02/12/2019, 1:07 PM  Inpatient medications: . vitamin C  500 mg Oral Daily  . calcitRIOL  1.75 mcg Oral Q T,Th,Sa-HD  . Chlorhexidine Gluconate Cloth  6 each Topical Q0600  . clonazePAM  0.25 mg Oral Daily  . clonazePAM  0.5 mg Oral QHS  . dexamethasone (DECADRON) injection  6 mg  Intravenous Daily  . docusate sodium  100 mg Oral q morning - 10a  . feeding supplement (NEPRO CARB STEADY)  237 mL Oral BID BM  . FLUoxetine  10 mg Oral Daily  . heparin  7,500 Units Subcutaneous Q8H  . insulin aspart  0-5 Units Subcutaneous QHS  . insulin aspart  0-6 Units Subcutaneous TID WC  . insulin detemir  15 Units Subcutaneous QAC breakfast  . Ipratropium-Albuterol  2 puff Inhalation TID  . levothyroxine  112 mcg Oral QAC breakfast  . midodrine  10 mg Oral BID  . montelukast  10 mg Oral QHS  . multivitamin  1 tablet Oral QHS  . rosuvastatin  40 mg Oral QHS  . senna  1 tablet Oral q morning - 10a  . sevelamer carbonate  1,600 mg Oral TID WC  . sodium chloride flush  3 mL Intravenous Q12H  . zinc sulfate  220 mg Oral Daily   . aztreonam 0.5 g (02/12/19 1153)  . remdesivir 100 mg in NS 100 mL  100 mg (02/12/19 0940)   acetaminophen, benzonatate, guaiFENesin-dextromethorphan, oxymetazoline, sevelamer carbonate, sodium chloride flush

## 2019-02-13 LAB — COMPREHENSIVE METABOLIC PANEL
ALT: 12 U/L (ref 0–44)
AST: 20 U/L (ref 15–41)
Albumin: 2.3 g/dL — ABNORMAL LOW (ref 3.5–5.0)
Alkaline Phosphatase: 63 U/L (ref 38–126)
Anion gap: 16 — ABNORMAL HIGH (ref 5–15)
BUN: 38 mg/dL — ABNORMAL HIGH (ref 8–23)
CO2: 24 mmol/L (ref 22–32)
Calcium: 8.3 mg/dL — ABNORMAL LOW (ref 8.9–10.3)
Chloride: 96 mmol/L — ABNORMAL LOW (ref 98–111)
Creatinine, Ser: 5.91 mg/dL — ABNORMAL HIGH (ref 0.44–1.00)
GFR calc Af Amer: 7 mL/min — ABNORMAL LOW (ref >60)
GFR calc non Af Amer: 6 mL/min — ABNORMAL LOW (ref >60)
Glucose, Bld: 77 mg/dL (ref 70–99)
Potassium: 4.2 mmol/L (ref 3.5–5.1)
Sodium: 136 mmol/L (ref 135–145)
Total Bilirubin: 0.9 mg/dL (ref 0.3–1.2)
Total Protein: 4.7 g/dL — ABNORMAL LOW (ref 6.5–8.1)

## 2019-02-13 LAB — CBC WITH DIFFERENTIAL/PLATELET
Abs Immature Granulocytes: 0.21 10*3/uL — ABNORMAL HIGH (ref 0.00–0.07)
Basophils Absolute: 0 10*3/uL (ref 0.0–0.1)
Basophils Relative: 1 %
Eosinophils Absolute: 0 10*3/uL (ref 0.0–0.5)
Eosinophils Relative: 0 %
HCT: 29.9 % — ABNORMAL LOW (ref 36.0–46.0)
Hemoglobin: 9.3 g/dL — ABNORMAL LOW (ref 12.0–15.0)
Immature Granulocytes: 4 %
Lymphocytes Relative: 29 %
Lymphs Abs: 1.6 10*3/uL (ref 0.7–4.0)
MCH: 29.4 pg (ref 26.0–34.0)
MCHC: 31.1 g/dL (ref 30.0–36.0)
MCV: 94.6 fL (ref 80.0–100.0)
Monocytes Absolute: 0.5 10*3/uL (ref 0.1–1.0)
Monocytes Relative: 9 %
Neutro Abs: 3.1 10*3/uL (ref 1.7–7.7)
Neutrophils Relative %: 57 %
Platelets: 181 10*3/uL (ref 150–400)
RBC: 3.16 MIL/uL — ABNORMAL LOW (ref 3.87–5.11)
RDW: 17.3 % — ABNORMAL HIGH (ref 11.5–15.5)
WBC: 5.5 10*3/uL (ref 4.0–10.5)
nRBC: 0 % (ref 0.0–0.2)

## 2019-02-13 LAB — GLUCOSE, CAPILLARY
Glucose-Capillary: 75 mg/dL (ref 70–99)
Glucose-Capillary: 73 mg/dL (ref 70–99)
Glucose-Capillary: 106 mg/dL — ABNORMAL HIGH (ref 70–99)
Glucose-Capillary: 107 mg/dL — ABNORMAL HIGH (ref 70–99)
Glucose-Capillary: 119 mg/dL — ABNORMAL HIGH (ref 70–99)

## 2019-02-13 LAB — D-DIMER, QUANTITATIVE: D-Dimer, Quant: 1.22 ug/mL-FEU — ABNORMAL HIGH (ref 0.00–0.50)

## 2019-02-13 LAB — TSH: TSH: 14.313 u[IU]/mL — ABNORMAL HIGH (ref 0.350–4.500)

## 2019-02-13 LAB — C-REACTIVE PROTEIN: CRP: 4.2 mg/dL — ABNORMAL HIGH (ref <1.0)

## 2019-02-13 LAB — LACTATE DEHYDROGENASE: LDH: 178 U/L (ref 98–192)

## 2019-02-13 LAB — PROCALCITONIN: Procalcitonin: 0.49 ng/mL

## 2019-02-13 MED ORDER — INSULIN DETEMIR 100 UNIT/ML ~~LOC~~ SOLN
10.0000 [IU] | Freq: Every day | SUBCUTANEOUS | Status: DC
  Filled 2019-02-13 (×2): qty 0.1

## 2019-02-13 MED ORDER — MUPIROCIN 2 % EX OINT
TOPICAL_OINTMENT | Freq: Every day | CUTANEOUS | Status: DC
  Filled 2019-02-13 (×5): qty 22

## 2019-02-13 MED ORDER — INSULIN DETEMIR 100 UNIT/ML ~~LOC~~ SOLN: 10.0000 [IU] | Freq: Every day | SUBCUTANEOUS | Status: DC

## 2019-02-13 NOTE — Progress Notes (Signed)
PROGRESS NOTE    Joan Mosley  [id number] DOB: 1945/08/15 DOA: 02/09/2019 PCP: Flossie Buffy, NP    Brief Narrative: 74 year old lady with prior history of end-stage renal disease on dialysis, COPD,, chronic respiratory failure on 5 L of home oxygen, type 2 diabetes mellitus, hypothyroidism, TIA presented to ED from dialysis for evaluation of hypoxia.  Patient was tested positive for COVID-19 on 02/07/2019 and 20 minutes into her dialysis she was noted to be hypoxic and was brought to ED for further evaluation.  On arrival to ED patient was saturating 88% on nonrebreather and she was switched to 20 L of high flow oxygen and sats have been around 92%.  Chest x-ray showed asymmetric pulmonary opacities compatible with COVID-19 pneumonia mildly progressed in the lower lungs compared to prior chest x-ray from 2 days ago. She is now admitted for further evaluation and management on acute on chronic respiratory failure with hypoxia secondary to COVID-19 multifocal pneumonia. Nephrology consulted for dialysis. She underwent HD and her breathing has improved. We were able to wean her down to 9 lit of HF oxygen and her sats have remained well above 95%.  Meanwhile palliative care consulted for [place], plan for outpatient palliative care follow up .  Of note patient had missed one HD session and missed more than a week of her synthroid tablets while waiting for her scripts to be filled. Recommend rechecking thyroid panel in 3 to 4 weeks post discharge and adjusting medications as needed.   Assessment & Plan:   Principal Problem:   Pneumonia due to COVID-19 virus Active Problems:   Type 2 diabetes mellitus with diabetic polyneuropathy, with long-term current use of insulin (HCC)   ESRD (end stage renal disease) (HCC)   Acute on chronic respiratory failure with hypoxia (HCC)   COPD (chronic obstructive pulmonary disease) ([place])   COVID-19   Acute on chronic respiratory failure with hypoxia  secondary to COVID-19 multifocal pneumonia Patient tested positive for Covid on 02/07/2019.  She initially required up to 20 L of high flow nasal cannula oxygen but after dialysis we were able to wean her down to 9 L of high flow oxygen.  PT evaluation ordered.  Meanwhile continue with incentive spirometry and flutter valve.  LDH is within normal limits CRP is 4.2, pro calcitonin less than 0.5, WBC count within normal limits, MRSA PCR is negative. Chest x-ray on admission shows asymmetric pulmonary opacity compatible with COVID-19 pneumonia in this setting. Mild progression in both lower lungs since 02/07/2019. She is currently receiving IV Decadron 6 mg daily, day 3 She is also on IV remdesivir, day 3 She will need to quarantine till 02/27/19.    Hypothyroidism Continue with Synthroid 112 MCG daily. TSH abnormal and high at 14.  As per the son patient missed her Synthroid for more than a week while waiting for her prescription to be filled out.  Recommend outpatient follow-up with PCP for repeat thyroid panel in about 3 to 4 weeks.   End-stage renal disease on dialysis Tuesday Thursday and Saturday. Volume management as per dialysis Nephrology on board.    Insulin-dependent diabetes mellitus with polyneuropathy and chronic kidney disease CBG (last 3)  Recent Labs    02/13/19 0806 02/13/19 1140 02/13/19 1525  GLUCAP 75 73 106*   Decrease the dose of Lantus to 10 units daily.  Continue with sliding scale insulin.   Body mass index is 41.24 kg/m. Morbidly obese Increase the DVT prophylaxis to 7500 TID.  History of COPD No wheezing heard on exam today. Continue with bronchodilators as needed.          DVT prophylaxis: Lovenox Code Status: Full code Family Communication: None at bedside, discussed with son the plan over the phone Disposition Plan: pending improvement in hypoxia.  Possible discharge home.  PT evaluation  Consultants:   Nephrology  Palliative  care consulted  Procedures: None Antimicrobials: None  Subjective: Patient denies any chest pain shortness of breath, nausea, vomiting, abdominal pain or diarrhea.  She appears to be in good spirits and answering all questions appropriately. Patient was coughing yesterday but she reports today cough has improved.  Objective: Vitals:   02/13/19 1300 02/13/19 1400 02/13/19 1423 02/13/19 1500  BP: (!) 146/77 (!) 110/58  107/61  Pulse: 81 69 70 75  Resp: (!) 26 (!) 21 (!) 21 (!) 23  Temp:      TempSrc:      SpO2: (!) 89% 91% 92% 93%  Weight:        Intake/Output Summary (Last 24 hours) at 02/13/2019 1606 Last data filed at 02/13/2019 1200 Gross per 24 hour  Intake 590 ml  Output --  Net 590 ml   Filed Weights   02/11/19 0100 02/11/19 1020 02/11/19 1330  Weight: 96.8 kg 100 kg 99 kg    Examination:  General exam: Alert and comfortable on 9 L of high flow oxygen, not in any kind of distress. Respiratory system: Decreased air entry, no wheezing or rhonchi, tachypnea present Cardiovascular system: S1-S2 heard, regular rate rhythm, JVD cannot be appreciated. Gastrointestinal system: Abdomen is soft, nontender, nondistended, bowel sounds normal Central nervous system: Alert and oriented to person and place and able to answer all questions appropriately and follow commands. Extremities: Trace pedal edema Skin: No rashes seen Psychiatry: Mood is appropriate    Data Reviewed: I have personally reviewed following labs and imaging studies  CBC: Recent Labs  Lab 02/07/19 1430 02/07/19 1430 02/09/19 1946 02/09/19 1957 02/10/19 0235 02/10/19 1712 02/11/19 0730 02/12/19 0349 02/13/19 0157  WBC 6.5   < > 6.9  --  7.1  --  4.5 4.6 5.5  NEUTROABS 4.2  --   --   --  6.3  --  3.0 3.1 3.1  HGB 10.1*   < > 10.1*   < > 9.8* 9.9* 9.6* 9.2* 9.3*  HCT 33.2*   < > 33.8*   < > 32.1* 29.0* 31.6* 30.6* 29.9*  MCV 97.4   < > 96.3  --  97.0  --  96.3 96.8 94.6  PLT 176   < > 144*  --  132*   --  132* 162 181   < > = values in this interval not displayed.   Basic Metabolic Panel: Recent Labs  Lab 02/09/19 1946 02/09/19 1957 02/10/19 0235 02/10/19 1712 02/11/19 0730 02/12/19 0349 02/13/19 0157  NA 136   < > 135 132* 137 136 136  K 4.1   < > 4.4 5.0 4.6 4.2 4.2  CL 92*  --  94*  --  94* 96* 96*  CO2 26  --  25  --  22 26 24   GLUCOSE 105*  --  119*  --  83 97 77  BUN 15  --  18  --  37* 22 38*  CREATININE 4.04*  --  4.36*  --  6.01* 4.11* 5.91*  CALCIUM 8.1*  --  8.3*  --  8.3* 8.4* 8.3*   < > = values  in this interval not displayed.   GFR: Estimated Creatinine Clearance: 8.9 mL/min (A) (by C-G formula based on SCr of 5.91 mg/dL (H)). Liver Function Tests: Recent Labs  Lab 02/09/19 1946 02/10/19 0235 02/11/19 0730 02/12/19 0349 02/13/19 0157  AST 24 18 22 22 20   ALT 13 13 12 15 12   ALKPHOS 86 77 63 67 63  BILITOT 1.3* 1.4* 1.0 0.8 0.9  PROT 5.5* 5.3* 5.2* 5.1* 4.7*  ALBUMIN 2.7* 2.6* 2.5* 2.3* 2.3*   No results for input(s): LIPASE, AMYLASE in the last 168 hours. No results for input(s): AMMONIA in the last 168 hours. Coagulation Profile: No results for input(s): INR, PROTIME in the last 168 hours. Cardiac Enzymes: No results for input(s): CKTOTAL, CKMB, CKMBINDEX, TROPONINI in the last 168 hours. BNP (last 3 results) No results for input(s): PROBNP in the last 8760 hours. HbA1C: No results for input(s): HGBA1C in the last 72 hours. CBG: Recent Labs  Lab 02/12/19 1628 02/12/19 2142 02/13/19 0806 02/13/19 1140 02/13/19 1525  GLUCAP 100* 102* 75 73 106*   Lipid Profile: No results for input(s): CHOL, HDL, LDLCALC, TRIG, CHOLHDL, LDLDIRECT in the last 72 hours. Thyroid Function Tests: Recent Labs    02/13/19 0157  TSH 14.313*   Anemia Panel: No results for input(s): VITAMINB12, FOLATE, FERRITIN, TIBC, IRON, RETICCTPCT in the last 72 hours. Sepsis Labs: Recent Labs  Lab 02/10/19 0235 02/11/19 0730 02/12/19 0349 02/13/19 0157   PROCALCITON 0.62 0.58 0.57 0.49    Recent Results (from the past 240 hour(s))  SARS CORONAVIRUS 2 (TAT 6-24 HRS) Nasopharyngeal Nasopharyngeal Swab     Status: Abnormal   Collection Time: 02/07/19  4:13 PM   Specimen: Nasopharyngeal Swab  Result Value Ref Range Status   SARS Coronavirus 2 POSITIVE (A) NEGATIVE Final    Comment: RESULT CALLED TO, READ BACK BY AND VERIFIED WITH: RN KIM SPRINGERS AT 2255 BY [place] ON 02/07/2019 (NOTE) SARS-CoV-2 target nucleic acids are DETECTED. The SARS-CoV-2 RNA is generally detectable in upper and lower respiratory specimens during the acute phase of infection. Positive results are indicative of the presence of SARS-CoV-2 RNA. Clinical correlation with patient history and other diagnostic information is  necessary to determine patient infection status. Positive results do not rule out bacterial infection or co-infection with other viruses.  The expected result is Negative. Fact Sheet for Patients: SugarRoll.be Fact Sheet for Healthcare Providers: https://www.woods-mathews.com/ This test is not yet approved or cleared by the [place] FDA and  has been authorized for detection and/or diagnosis of SARS-CoV-2 by FDA under an Emergency Use Authorization (EUA). This EUA will remain  in effect (meaning this te st can be used) for the duration of the COVID-19 declaration under Section 564(b)(1) of the Act, 21 U.S.C. section 360bbb-3(b)(1), unless the authorization is terminated or revoked sooner. Performed at [facility], [place] [address] [place], [place] [address]   MRSA PCR Screening     Status: None   Collection Time: 02/11/19  1:21 AM   Specimen: Nasal Mucosa; Nasopharyngeal  Result Value Ref Range Status   MRSA by PCR NEGATIVE NEGATIVE Final    Comment:        The GeneXpert MRSA Assay (FDA approved for NASAL specimens only), is one component of a comprehensive MRSA  colonization surveillance program. It is not intended to diagnose MRSA infection nor to guide or monitor treatment for MRSA infections. Performed at [facility], [place] [address], [place],  [address]  Radiology Studies: DG CHEST PORT 1 VIEW  Result Date: 02/12/2019 CLINICAL DATA:  Followup COVID pneumonia. EXAM: PORTABLE CHEST 1 VIEW COMPARISON:  02/09/2019 FINDINGS: The right IJ dialysis catheter is stable. The heart is normal in size and stable. There is moderate tortuosity and calcification of the thoracic aorta. Improved lung aeration with clearing infiltrates. There is a persistent small right pleural effusion and overlying right lower lobe atelectasis or infiltrate. IMPRESSION: Improving lung aeration with clearing infiltrates. Persistent small right effusion and overlying atelectasis or infiltrate. Electronically Signed   By: Marijo Sanes M.D.   On: 02/12/2019 09:13        Scheduled Meds: . vitamin C  500 mg Oral Daily  . calcitRIOL  1.75 mcg Oral Q T,Th,Sa-HD  . Chlorhexidine Gluconate Cloth  6 each Topical Q0600  . clonazePAM  0.25 mg Oral Daily  . clonazepam  0.25 mg Oral QHS  . dexamethasone (DECADRON) injection  6 mg Intravenous Daily  . docusate sodium  100 mg Oral q morning - 10a  . feeding supplement (NEPRO CARB STEADY)  237 mL Oral BID BM  . FLUoxetine  10 mg Oral Daily  . heparin  7,500 Units Subcutaneous Q8H  . insulin aspart  0-5 Units Subcutaneous QHS  . insulin aspart  0-6 Units Subcutaneous TID WC  . insulin detemir  10 Units Subcutaneous QAC breakfast  . Ipratropium-Albuterol  2 puff Inhalation TID  . levothyroxine  112 mcg Oral QAC breakfast  . midodrine  10 mg Oral BID  . montelukast  10 mg Oral QHS  . multivitamin  1 tablet Oral QHS  . mupirocin ointment   Topical Daily  . rosuvastatin  40 mg Oral QHS  . senna  1 tablet Oral q morning - 10a  . sevelamer carbonate  1,600 mg Oral TID WC  . sodium chloride flush  3 mL  Intravenous Q12H  . zinc sulfate  220 mg Oral Daily   Continuous Infusions: . remdesivir 100 mg in NS 100 mL 100 mg (02/13/19 1026)     LOS: 4 days        Hosie Poisson, MD [facility]    02/13/2019, 4:06 PM

## 2019-02-13 NOTE — Plan of Care (Signed)
  Problem: Clinical Measurements: Goal: Respiratory complications will improve 02/13/2019 0930 by Netta Corrigan, RN Note: Pt is tolerating lower levels of oxygen this morning. O2 titrated from 12L to 10L with oxygen saturations in upper 80s  02/13/2019 0930 by Netta Corrigan, RN Outcome: Progressing Note: Pt is tolerating lower levels of o   Problem: Nutrition: Goal: Adequate nutrition will be maintained Outcome: Progressing Note: Pt is maintaining good meal intake   Problem: Elimination: Goal: Will not experience complications related to bowel motility Outcome: Progressing Note: Pt has smear BM today   Problem: Pain Managment: Goal: General experience of comfort will improve Outcome: Progressing   Problem: Education: Goal: Knowledge of risk factors and measures for prevention of condition will improve Outcome: Progressing   Problem: Respiratory: Goal: Will maintain a patent airway Outcome: Progressing

## 2019-02-13 NOTE — Progress Notes (Signed)
Per patients son, Joan Mosley, who lives out of state, the reason his mothers TSH levels came back high were probably because she didn't take her synthroid for about a week waiting for it to be refilled. Wanted MD to know.

## 2019-02-13 NOTE — Consult Note (Signed)
[facility] Nurse Consult Note: Reason for Consult: trauma wound to left posterior lower leg/.  Trauma.  Scabbed lesion Wound type:trauma Pressure Injury POA: NA Measurement: 1 cm x 1.5 cm scabbed Wound QP:830441 Drainage (amount, consistency, odor) none noted.   Periwound:dry skin Dressing procedure/placement/frequency:Cleanse wound to left lower leg with NS and pat dry.  Apply mupirocin ointment to wound bed.  Cover with dry gauze and kerlix/tape.  Change daily.  Will not follow at this time.  Please re-consult if needed.  Domenic Moras MSN, RN, FNP-BC CWON Wound, Ostomy, Continence Nurse Pager [phone]

## 2019-02-13 NOTE — Progress Notes (Signed)
[facility] Progress Note   Home meds: - furosemide 80 qd/ midodrine 10 TTS pre hd - sevelamer carb ac tid - clonazepam 0.25 am and 0.50 pm/ fluoxetine 10 qd - rosuvastatin 40 hs - montelukast 10 hs/ levalbuterol tid prn/ home O2 3L - levothyroxine 112 ug - insulin detemir 15 qd/ lispro SSI tid   Outpt HD:TTS G-O  4h 400/800 94kg 2/2 bath TDC R - sensipar 60 po - venofer 50 /wk - mircera 75 ug q 2wk, last 1/14 - calc 1.75 ug tiw Assessment/ Plan:   1. COVID + PNA - tested + on 1/12, will need to quarantine until 02/27/19. Getting IV steroids / remdesivir and IV vanc / aztreonam for poss CAP 2. ESRD - on HD TTS at COVID shift. Had not missed HD. Next HD 1/19. No acute indication for HD today. 3. Hypotension/ volume - chronic hypotension on midodrine, is up 4-5 kg by wts, unable to pull much w/ hypotension. Changed midodrine from pre HD to 10mg  bid daily.  4. DM 2 - on insulin 5. Anemia ckd - had esa yest 6. Renal osteodystrophy - will check a phos in am. 7. COPD /chron resp failure - on home O2 5L 8. Pall Care - pt is enrolled in a community-based Palliative Care program at home. Consider PC consult for [place].   Subjective:   Denies f/c/n/v/ dyspnea.   Objective:   BP (!) 101/58   Pulse 78   Temp (!) 97.5 F (36.4 C) (Axillary)   Resp 15   Wt 99 kg   SpO2 90%   BMI 41.24 kg/m   Intake/Output Summary (Last 24 hours) at 02/13/2019 1034 Last data filed at 02/12/2019 2200 Gross per 24 hour  Intake 250 ml  Output --  Net 250 ml   Weight change:   Physical Exam: Genlethargic but arouse to voice No jvd or bruits Chest clear bilatbilat, distant BS RRR no MRG Abd soft ntnd no mass or ascites +bsobese Extchronic 1+ distal pretib bilatedema Neuro isawake but hard of hearing, nonfocal Access RIJ TC  Imaging: DG CHEST PORT 1 VIEW  Result Date: 02/12/2019 CLINICAL DATA:  Followup COVID pneumonia. EXAM: PORTABLE CHEST 1 VIEW  COMPARISON:  02/09/2019 FINDINGS: The right IJ dialysis catheter is stable. The heart is normal in size and stable. There is moderate tortuosity and calcification of the thoracic aorta. Improved lung aeration with clearing infiltrates. There is a persistent small right pleural effusion and overlying right lower lobe atelectasis or infiltrate. IMPRESSION: Improving lung aeration with clearing infiltrates. Persistent small right effusion and overlying atelectasis or infiltrate. Electronically Signed   By: Marijo Sanes M.D.   On: 02/12/2019 09:13    Labs: BMET Recent Labs  Lab 02/07/19 1430 02/07/19 1430 02/09/19 1946 02/09/19 1957 02/10/19 0235 02/10/19 1712 02/11/19 0730 02/12/19 0349 02/13/19 0157  NA 132*   < > 136 135 135 132* 137 136 136  K 4.9   < > 4.1 3.9 4.4 5.0 4.6 4.2 4.2  CL 91*  --  92*  --  94*  --  94* 96* 96*  CO2 27  --  26  --  25  --  22 26 24   GLUCOSE 78  --  105*  --  119*  --  83 97 77  BUN 41*  --  15  --  18  --  37* 22 38*  CREATININE 6.84*  --  4.04*  --  4.36*  --  6.01* 4.11* 5.91*  CALCIUM 8.2*  --  8.1*  --  8.3*  --  8.3* 8.4* 8.3*   < > = values in this interval not displayed.   CBC Recent Labs  Lab 02/10/19 0235 02/10/19 0235 02/10/19 1712 02/11/19 0730 02/12/19 0349 02/13/19 0157  WBC 7.1  --   --  4.5 4.6 5.5  NEUTROABS 6.3  --   --  3.0 3.1 3.1  HGB 9.8*   < > 9.9* 9.6* 9.2* 9.3*  HCT 32.1*   < > 29.0* 31.6* 30.6* 29.9*  MCV 97.0  --   --  96.3 96.8 94.6  PLT 132*  --   --  132* 162 181   < > = values in this interval not displayed.    Medications:    . vitamin C  500 mg Oral Daily  . calcitRIOL  1.75 mcg Oral Q T,Th,Sa-HD  . Chlorhexidine Gluconate Cloth  6 each Topical Q0600  . clonazePAM  0.25 mg Oral Daily  . clonazepam  0.25 mg Oral QHS  . dexamethasone (DECADRON) injection  6 mg Intravenous Daily  . docusate sodium  100 mg Oral q morning - 10a  . feeding supplement (NEPRO CARB STEADY)  237 mL Oral BID BM  . FLUoxetine  10  mg Oral Daily  . heparin  7,500 Units Subcutaneous Q8H  . insulin aspart  0-5 Units Subcutaneous QHS  . insulin aspart  0-6 Units Subcutaneous TID WC  . insulin detemir  10 Units Subcutaneous QAC breakfast  . Ipratropium-Albuterol  2 puff Inhalation TID  . levothyroxine  112 mcg Oral QAC breakfast  . midodrine  10 mg Oral BID  . montelukast  10 mg Oral QHS  . multivitamin  1 tablet Oral QHS  . mupirocin ointment   Topical Daily  . rosuvastatin  40 mg Oral QHS  . senna  1 tablet Oral q morning - 10a  . sevelamer carbonate  1,600 mg Oral TID WC  . sodium chloride flush  3 mL Intravenous Q12H  . zinc sulfate  220 mg Oral Daily      Otelia Santee, MD 02/13/2019, 10:34 AM

## 2019-02-13 NOTE — Telephone Encounter (Signed)
Received letter from [facility] stating they are not covering for Albuterol HFA 90 mcg inhaler.   Last message stating they will cover for: Albuterol Sulfate HFA, Ventolin HFA.  Baldo Ash please advise

## 2019-02-14 LAB — CBC WITH DIFFERENTIAL/PLATELET
Abs Immature Granulocytes: 0.29 10*3/uL — ABNORMAL HIGH (ref 0.00–0.07)
Basophils Absolute: 0 10*3/uL (ref 0.0–0.1)
Basophils Relative: 0 %
Eosinophils Absolute: 0 10*3/uL (ref 0.0–0.5)
Eosinophils Relative: 0 %
HCT: 30.2 % — ABNORMAL LOW (ref 36.0–46.0)
Hemoglobin: 9.4 g/dL — ABNORMAL LOW (ref 12.0–15.0)
Immature Granulocytes: 4 %
Lymphocytes Relative: 24 %
Lymphs Abs: 1.8 10*3/uL (ref 0.7–4.0)
MCH: 29.1 pg (ref 26.0–34.0)
MCHC: 31.1 g/dL (ref 30.0–36.0)
MCV: 93.5 fL (ref 80.0–100.0)
Monocytes Absolute: 0.6 10*3/uL (ref 0.1–1.0)
Monocytes Relative: 8 %
Neutro Abs: 4.7 10*3/uL (ref 1.7–7.7)
Neutrophils Relative %: 64 %
Platelets: 189 10*3/uL (ref 150–400)
RBC: 3.23 MIL/uL — ABNORMAL LOW (ref 3.87–5.11)
RDW: 17.2 % — ABNORMAL HIGH (ref 11.5–15.5)
WBC: 7.3 10*3/uL (ref 4.0–10.5)
nRBC: 0 % (ref 0.0–0.2)

## 2019-02-14 LAB — BASIC METABOLIC PANEL
Anion gap: 16 — ABNORMAL HIGH (ref 5–15)
BUN: 51 mg/dL — ABNORMAL HIGH (ref 8–23)
CO2: 23 mmol/L (ref 22–32)
Calcium: 8.4 mg/dL — ABNORMAL LOW (ref 8.9–10.3)
Chloride: 96 mmol/L — ABNORMAL LOW (ref 98–111)
Creatinine, Ser: 7.29 mg/dL — ABNORMAL HIGH (ref 0.44–1.00)
GFR calc Af Amer: 6 mL/min — ABNORMAL LOW (ref >60)
GFR calc non Af Amer: 5 mL/min — ABNORMAL LOW (ref >60)
Glucose, Bld: 88 mg/dL (ref 70–99)
Potassium: 4.5 mmol/L (ref 3.5–5.1)
Sodium: 135 mmol/L (ref 135–145)

## 2019-02-14 LAB — GLUCOSE, CAPILLARY
Glucose-Capillary: 61 mg/dL — ABNORMAL LOW (ref 70–99)
Glucose-Capillary: 75 mg/dL (ref 70–99)
Glucose-Capillary: 81 mg/dL (ref 70–99)
Glucose-Capillary: 99 mg/dL (ref 70–99)
Glucose-Capillary: 94 mg/dL (ref 70–99)
Glucose-Capillary: 113 mg/dL — ABNORMAL HIGH (ref 70–99)

## 2019-02-14 LAB — D-DIMER, QUANTITATIVE: D-Dimer, Quant: 1.12 ug/mL-FEU — ABNORMAL HIGH (ref 0.00–0.50)

## 2019-02-14 LAB — T4: T4, Total: 3.7 ug/dL — ABNORMAL LOW (ref 4.5–12.0)

## 2019-02-14 LAB — HEMOGLOBIN A1C
Hgb A1c MFr Bld: 6 % — ABNORMAL HIGH (ref 4.8–5.6)
Mean Plasma Glucose: 125.5 mg/dL

## 2019-02-14 LAB — C-REACTIVE PROTEIN: CRP: 4.1 mg/dL — ABNORMAL HIGH (ref <1.0)

## 2019-02-14 MED ORDER — MIDODRINE HCL 5 MG PO TABS
ORAL_TABLET | ORAL | Status: AC
  Administered 2019-02-16: 14:00:00 10 mg via ORAL
  Filled 2019-02-14: qty 2

## 2019-02-14 MED ORDER — ALBUMIN HUMAN 25 % IV SOLN
INTRAVENOUS | Status: AC
  Administered 2019-02-14: 25 g
  Filled 2019-02-14: qty 100

## 2019-02-14 MED ORDER — VENTOLIN HFA 108 (90 BASE) MCG/ACT IN AERS: 2.0000 | INHALATION_SPRAY | Freq: Four times a day (QID) | RESPIRATORY_TRACT | 2 refills | Status: DC | PRN

## 2019-02-14 MED ORDER — SODIUM CHLORIDE 0.9 % IV SOLN: INTRAVENOUS | Status: DC | PRN

## 2019-02-14 MED ORDER — HEPARIN SODIUM (PORCINE) 1000 UNIT/ML IJ SOLN
INTRAMUSCULAR | Status: AC
  Filled 2019-02-14: qty 4

## 2019-02-14 NOTE — Progress Notes (Signed)
[facility] Progress Note   Home meds: - furosemide 80 qd/ midodrine 10 TTS pre hd - sevelamer carb ac tid - clonazepam 0.25 am and 0.50 pm/ fluoxetine 10 qd - rosuvastatin 40 hs - montelukast 10 hs/ levalbuterol tid prn/ home O2 3L - levothyroxine 112 ug - insulin detemir 15 qd/ lispro SSI tid   Outpt HD:TTS G-O  4h 400/800 94kg 2/2 bath TDC R - sensipar 60 po - venofer 50 /wk - mircera 75 ug q 2wk, last 1/14 - calc 1.75 ug tiw  Assessment/ Plan:   1. COVID + PNA - tested + on 1/12, will need to quarantine until 02/27/19. Getting IV steroids / remdesivir and IV vanc / aztreonam for poss CAP 2. ESRD - on HD TTS at COVID shift.   Seen on  HD VSS Goal UF 2L as tolerated Not on pressors, no changes.  3. Hypotension/ volume - chronic hypotension on midodrine, is up 4-5 kg by wts, unable to pull much w/ hypotension.Changedmidodrine from pre HDto 10mg  bid daily.  4. DM 2 - on insulin 5. Anemia ckd - had esa yest 6. Renal osteodystrophy - ordered a phos for 1/20 7. COPD /chron resp failure - on home O2 5L 8. Pall Care - pt is enrolled in a community-based Palliative Care program at home. Consider PC consult for [place].   Subjective:   Asking when she can go home. Denies f/c/n/v/cp.   Objective:   BP (!) 101/56   Pulse 74   Temp 97.7 F (36.5 C) (Oral)   Resp 18   Wt 98 kg   SpO2 90%   BMI 40.82 kg/m   Intake/Output Summary (Last 24 hours) at 02/14/2019 0946 Last data filed at 02/13/2019 2130 Gross per 24 hour  Intake 510 ml  Output --  Net 510 ml   Weight change:   Physical Exam: Gen pleasant No jvd or bruits Chest clear bilatbilat, distant BS RRR no MRG Abd soft ntnd no mass or ascites +bsobese Extchronic 1+ distal pretib bilatedema Neuro isawake but hard of hearing, nonfocal Access RIJ TC  Imaging: No results found.  Labs: BMET Recent Labs  Lab 02/07/19 1430 02/07/19 1430 02/09/19 1946 02/09/19 1946  02/09/19 1957 02/10/19 0235 02/10/19 1712 02/11/19 0730 02/12/19 0349 02/13/19 0157 02/14/19 0221  NA 132*   < > 136   < > 135 135 132* 137 136 136 135  K 4.9   < > 4.1   < > 3.9 4.4 5.0 4.6 4.2 4.2 4.5  CL 91*  --  92*  --   --  94*  --  94* 96* 96* 96*  CO2 27  --  26  --   --  25  --  22 26 24 23   GLUCOSE 78  --  105*  --   --  119*  --  83 97 77 88  BUN 41*  --  15  --   --  18  --  37* 22 38* 51*  CREATININE 6.84*  --  4.04*  --   --  4.36*  --  6.01* 4.11* 5.91* 7.29*  CALCIUM 8.2*  --  8.1*  --   --  8.3*  --  8.3* 8.4* 8.3* 8.4*   < > = values in this interval not displayed.   CBC Recent Labs  Lab 02/11/19 0730 02/12/19 0349 02/13/19 0157 02/14/19 0221  WBC 4.5 4.6 5.5 7.3  NEUTROABS 3.0 3.1 3.1 4.7  HGB 9.6* 9.2*  9.3* 9.4*  HCT 31.6* 30.6* 29.9* 30.2*  MCV 96.3 96.8 94.6 93.5  PLT 132* 162 181 189    Medications:    . vitamin C  500 mg Oral Daily  . calcitRIOL  1.75 mcg Oral Q T,Th,Sa-HD  . Chlorhexidine Gluconate Cloth  6 each Topical Q0600  . clonazePAM  0.25 mg Oral Daily  . clonazepam  0.25 mg Oral QHS  . dexamethasone (DECADRON) injection  6 mg Intravenous Daily  . docusate sodium  100 mg Oral q morning - 10a  . feeding supplement (NEPRO CARB STEADY)  237 mL Oral BID BM  . FLUoxetine  10 mg Oral Daily  . heparin      . heparin  7,500 Units Subcutaneous Q8H  . insulin aspart  0-5 Units Subcutaneous QHS  . insulin aspart  0-6 Units Subcutaneous TID WC  . Ipratropium-Albuterol  2 puff Inhalation TID  . levothyroxine  112 mcg Oral QAC breakfast  . midodrine      . midodrine  10 mg Oral BID  . montelukast  10 mg Oral QHS  . multivitamin  1 tablet Oral QHS  . mupirocin ointment   Topical Daily  . rosuvastatin  40 mg Oral QHS  . senna  1 tablet Oral q morning - 10a  . sevelamer carbonate  1,600 mg Oral TID WC  . sodium chloride flush  3 mL Intravenous Q12H  . zinc sulfate  220 mg Oral Daily      Otelia Santee, MD 02/14/2019, 9:46 AM

## 2019-02-14 NOTE — Evaluation (Signed)
Physical Therapy Evaluation Patient Details Name: Joan Mosley MRN: [id number] DOB: 07/04/1945 Today's Date: 02/14/2019   History of Present Illness  This 74 y.o. female admitted from HD 02/09/2019 center due to hypoxia.  Pt tested + for COVID on 02/07/2019.  Dx: COVID related PNA.  PMH includes: DM  , ESRD, COPD, Hypothyroidism  Clinical Impression  Patient presents with decreased mobility due to decreased strength, decreased cardiorespiratory endurance, decreased balance, and now needing max A +2 for transfer to [facility].  Previously her daughter and son would assist with this at home.  She is very much wanting to go home, but currently most appropriate for SNF level rehab.  If she decides to go home would need hoyer lift for transfers so she can get to dialysis.  PT to follow acutely.    Follow Up Recommendations SNF    Equipment Recommendations  Other (comment)(hoyer lift if goes home)    Recommendations for Other Services       Precautions / Restrictions Precautions Precautions: Fall Precaution Comments: O2 dependent      Mobility  Bed Mobility Overal bed mobility: Needs Assistance Bed Mobility: Supine to Sit;Sit to Supine     Supine to sit: Mod assist;+2 for physical assistance;+2 for safety/equipment Sit to supine: Max assist;+2 for safety/equipment;+2 for physical assistance   General bed mobility comments: assist to move LEs off bed and to lift trunk then assist back to bed   Transfers Overall transfer level: Needs assistance Equipment used: 2 person hand held assist Transfers: Sit to/from [employer] Transfers Sit to Stand: Mod assist;+2 physical assistance;Max assist;+2 safety/equipment Stand pivot transfers: Max assist;+2 physical assistance;+2 safety/equipment       General transfer comment: Pt initially required mod A +2 to move into standing and to maintain balance to transfer to commode.  However, as she fatigued, she required increased assist and  progressed to requiring max A +2 and use of stedy to return to bed   4 times up to stand to attempt to assist with perineal hygiene; pt fatigued  Ambulation/Gait                Stairs            Wheelchair Mobility    Modified Rankin (Stroke Patients Only)       Balance Overall balance assessment: Needs assistance Sitting-balance support: Feet supported;Single extremity supported Sitting balance-Leahy Scale: Fair Sitting balance - Comments: able to maintain static sitting with min guard assist    Standing balance support: Bilateral upper extremity supported Standing balance-Leahy Scale: Poor Standing balance comment: Pt requires max A to maintain static standing for 15- 30 seconds.  She fatigues quickly and spontaneously sits                              Pertinent Vitals/Pain Pain Assessment: Faces Faces Pain Scale: Hurts little more Pain Location: Lt lower abdomen  Pain Descriptors / Indicators: Grimacing;Guarding Pain Intervention(s): Monitored during session;Other (comment)(RN notified due to wound on her side)    Home Living Family/patient expects to be discharged to:: Private residence Living Arrangements: Children Available Help at Discharge: Family;Available 24 hours/day Type of Home: House Home Access: Ramped entrance     Home Layout: One level Home Equipment: Walker - 2 wheels;Cane - single point;Wheelchair - power;Electric scooter Additional Comments: supplemental O2 5L at baseline    Prior Function Level of Independence: Needs assistance   Gait / Transfers Assistance Needed:  transfers with two children  ADL's / Homemaking Assistance Needed: has help with all ADL's   Comments: Pt reports she spends her time in and sleeps in her recliner.  her children assist with ADLs and transfers to [facility] and w/c.      Hand Dominance   Dominant Hand: Right    Extremity/Trunk Assessment   Upper Extremity Assessment Upper Extremity Assessment:  Defer to OT evaluation    Lower Extremity Assessment Lower Extremity Assessment: LLE deficits/detail;RLE deficits/detail RLE Deficits / Details: AAROM WFL, strength hip flexion 2/5, knee extension 3+/5 LLE Deficits / Details: AAROM WFL, strength hip flexion 2/5, knee extension 3+/5       Communication   Communication: HOH  Cognition Arousal/Alertness: Awake/alert Behavior During Therapy: WFL for tasks assessed/performed Overall Cognitive Status: No family/caregiver present to determine baseline cognitive functioning                                 General Comments: Pt is slow to process info and frequently states "I don't know" or we will see"  She is HOH so that may contribute       General Comments General comments (skin integrity, edema, etc.): SpO2 84-86% on10L HFNC, sats improved to mid 90's on 15L HFNC HR 122 with fatigue getting up for back to bed    Exercises     Assessment/Plan    PT Assessment Patient needs continued PT services  PT Problem List Decreased strength;Decreased activity tolerance;Decreased mobility;Cardiopulmonary status limiting activity;Decreased knowledge of use of DME;Decreased balance       PT Treatment Interventions DME instruction;Therapeutic activities;Balance training;Patient/family education;Wheelchair mobility training;Therapeutic exercise;Functional mobility training    PT Goals (Current goals can be found in the Care Plan section)  Acute Rehab PT Goals Patient Stated Goal: I want to go home  PT Goal Formulation: With patient Time For Goal Achievement: 02/28/19 Potential to Achieve Goals: Fair    Frequency Min 3X/week   Barriers to discharge        Co-evaluation PT/OT/SLP Co-Evaluation/Treatment: Yes Reason for Co-Treatment: Complexity of the patient's impairments (multi-system involvement);For patient/therapist safety;To address functional/ADL transfers PT goals addressed during session: Mobility/safety with  mobility;Strengthening/ROM OT goals addressed during session: ADL's and self-care       AM-PAC PT "6 Clicks" Mobility  Outcome Measure Help needed turning from your back to your side while in a flat bed without using bedrails?: A Lot Help needed moving from lying on your back to sitting on the side of a flat bed without using bedrails?: A Lot Help needed moving to and from a bed to a chair (including a wheelchair)?: Total Help needed standing up from a chair using your arms (e.g., wheelchair or bedside chair)?: Total Help needed to walk in hospital room?: Total Help needed climbing 3-5 steps with a railing? : Total 6 Click Score: 8    End of Session Equipment Utilized During Treatment: Oxygen Activity Tolerance: Patient limited by fatigue Patient left: in bed;with call bell/phone within reach;with nursing/sitter in room Nurse Communication: Mobility status PT Visit Diagnosis: Muscle weakness (generalized) (M62.81);Other abnormalities of gait and mobility (R26.89)    Time: [id number] PT Time Calculation (min) (ACUTE ONLY): 64 min   Charges:   PT Evaluation $PT Eval Moderate Complexity: 1 Mod PT Treatments $Therapeutic Activity: 8-22 mins        Magda Kiel, [place] [facility] [phone] 02/14/2019   Reginia Naas 02/14/2019, 5:41 PM

## 2019-02-14 NOTE — Addendum Note (Signed)
Addended byShawnie Pons on: 02/14/2019 04:28 PM   Modules accepted: Orders

## 2019-02-14 NOTE — Progress Notes (Signed)
PROGRESS NOTE    Joan Mosley  [id number] DOB: [birth year] DOA: 02/09/2019 PCP: Flossie Buffy, NP    Brief Narrative: 74 year old lady with prior history of end-stage renal disease on dialysis, COPD,, chronic respiratory failure on 5 L of home oxygen, type 2 diabetes mellitus, hypothyroidism, TIA presented to ED from dialysis for evaluation of hypoxia.  Patient was tested positive for COVID-19 on 02/07/2019 and 20 minutes into her dialysis she was noted to be hypoxic and was brought to ED for further evaluation.  On arrival to ED patient was saturating 88% on nonrebreather and she was switched to 20 L of high flow oxygen and sats have been around 92%.  Chest x-ray showed asymmetric pulmonary opacities compatible with COVID-19 pneumonia mildly progressed in the lower lungs compared to prior chest x-ray from 2 days ago. She is now admitted for further evaluation and management on acute on chronic respiratory failure with hypoxia secondary to COVID-19 multifocal pneumonia. Nephrology consulted for dialysis. She underwent HD and her breathing has improved. We were able to wean her down to 9 lit of HF oxygen and her sats have remained well above 95%.  Meanwhile palliative care consulted for [place], plan for outpatient palliative care follow up .  Of note patient had missed one HD session and missed more than a week of her synthroid tablets while waiting for her scripts to be filled. Recommend rechecking thyroid panel in 3 to 4 weeks post discharge and adjusting medications as needed.  Pt seen and examined, still on 10 lit of HF oxygen but stable.   Assessment & Plan:   Principal Problem:   Pneumonia due to COVID-19 virus Active Problems:   Type 2 diabetes mellitus with diabetic polyneuropathy, with long-term current use of insulin (HCC)   ESRD (end stage renal disease) (HCC)   Acute on chronic respiratory failure with hypoxia (HCC)   COPD (chronic obstructive pulmonary disease) ([place])  COVID-19   Acute on chronic respiratory failure with hypoxia secondary to COVID-19 multifocal pneumonia Patient tested positive for Covid on 02/07/2019.  She initially required up to 20 L of high flow nasal cannula oxygen but after dialysis we were able to wean her down to 10 L of high flow oxygen.  PT evaluation ordered recommending SNF.   Meanwhile continue with incentive spirometry and flutter valve.  LDH is within normal limits CRP is 4.1, pro calcitonin less than 0.5, WBC count within normal limits, MRSA PCR is negative. Chest x-ray on admission shows asymmetric pulmonary opacity compatible with COVID-19 pneumonia  Mild progression in both lower lungs since 02/07/2019. She is currently receiving IV Decadron 6 mg daily, day 4 She is also on IV remdesivir, day 4 She will need to quarantine till 02/27/19. She remains afebrile and wbc count within normal limits.    Hypothyroidism Continue with Synthroid 112 MCG daily. TSH abnormal and high at 14.  As per the son patient missed her Synthroid for more than a week while waiting for her prescription to be filled out.  Recommend outpatient follow-up with PCP for repeat thyroid panel in about 3 to 4 weeks.   End-stage renal disease on dialysis Tuesday Thursday and Saturday. Volume management as per dialysis Nephrology on board. Plan for HD today  Insulin-dependent diabetes mellitus with polyneuropathy and chronic kidney disease CBG (last 3)  Recent Labs    02/14/19 1130 02/14/19 1529 02/14/19 1700  GLUCAP 81 99 94   Discontinued the lantus as her CBG is in 60's this am.  Continue with SSI. Last A1c is 5.6.    Body mass index is 39.99 kg/m. Morbidly obese Increase the DVT prophylaxis to 7500 TID.     History of COPD No wheezing heard Continue with bronchodilators as needed.   Pressure injury on admission.   Pressure Injury 02/13/19 Heel Left Deep Tissue Pressure Injury - Purple or maroon localized area of discolored intact  skin or blood-filled blister due to damage of underlying soft tissue from pressure and/or shear. (Active)  02/13/19 1000  Location: Heel  Location Orientation: Left  Staging: Deep Tissue Pressure Injury - Purple or maroon localized area of discolored intact skin or blood-filled blister due to damage of underlying soft tissue from pressure and/or shear.  Wound Description (Comments):   Present on Admission:    Wound care consulted.       DVT prophylaxis: Lovenox Code Status: Full code Family Communication: None at bedside, discussed with son the plan over the phone Disposition Plan: pending improvement in hypoxia, plan for SNF when is medically stable.   Consultants:   Nephrology  Palliative care consulted  Wound care.   Procedures: None Antimicrobials: None  Subjective: Patient continues to cough, winded on moving even a little.  Needed lift to get her oob to chair.  She denies any chest pain, nausea, vomiting or diarrhea.   Objective: Vitals:   02/14/19 1630 02/14/19 1645 02/14/19 1700 02/14/19 1706  BP: 115/72 122/63 131/77   Pulse: 73 69 75 73  Resp: 18 (!) 21 (!) 21 19  Temp:      TempSrc:      SpO2: 90% (!) 89% (!) 88% 94%  Weight:        Intake/Output Summary (Last 24 hours) at 02/14/2019 1730 Last data filed at 02/14/2019 1406 Gross per 24 hour  Intake 574.03 ml  Output 2300 ml  Net -1725.97 ml   Filed Weights   02/11/19 1330 02/14/19 0737 02/14/19 1100  Weight: 99 kg 98 kg 96 kg    Examination:  General exam: sleeping comfortably on 10 lit of HF oxygen. Not in any distress.  Respiratory system: decreased air entry at bases, no wheezing or rhonchi, tachypnea present.  Cardiovascular system: S1S2 heard, RRR, trace edema of the lower extremities.  Gastrointestinal system: Abdomen is soft, non tender non distended, bowel sounds normal. Central nervous system: arousable,  oriented, able to answer all questions appropriately, follows commands. Grossly  non focal.  Extremities: no cyanosis or clubbing.  Skin: left heel deep injury.  Psychiatry: mood is appropriate.     Data Reviewed: I have personally reviewed following labs and imaging studies  CBC: Recent Labs  Lab 02/10/19 0235 02/10/19 0235 02/10/19 1712 02/11/19 0730 02/12/19 0349 02/13/19 0157 02/14/19 0221  WBC 7.1  --   --  4.5 4.6 5.5 7.3  NEUTROABS 6.3  --   --  3.0 3.1 3.1 4.7  HGB 9.8*   < > 9.9* 9.6* 9.2* 9.3* 9.4*  HCT 32.1*   < > 29.0* 31.6* 30.6* 29.9* 30.2*  MCV 97.0  --   --  96.3 96.8 94.6 93.5  PLT 132*  --   --  132* 162 181 189   < > = values in this interval not displayed.   Basic Metabolic Panel: Recent Labs  Lab 02/10/19 0235 02/10/19 0235 02/10/19 1712 02/11/19 0730 02/12/19 0349 02/13/19 0157 02/14/19 0221  NA 135   < > 132* 137 136 136 135  K 4.4   < > 5.0 4.6 4.2  4.2 4.5  CL 94*  --   --  94* 96* 96* 96*  CO2 25  --   --  22 26 24 23   GLUCOSE 119*  --   --  83 97 77 88  BUN 18  --   --  37* 22 38* 51*  CREATININE 4.36*  --   --  6.01* 4.11* 5.91* 7.29*  CALCIUM 8.3*  --   --  8.3* 8.4* 8.3* 8.4*   < > = values in this interval not displayed.   GFR: Estimated Creatinine Clearance: 7.1 mL/min (A) (by C-G formula based on SCr of 7.29 mg/dL (H)). Liver Function Tests: Recent Labs  Lab 02/09/19 1946 02/10/19 0235 02/11/19 0730 02/12/19 0349 02/13/19 0157  AST 24 18 22 22 20   ALT 13 13 12 15 12   ALKPHOS 86 77 63 67 63  BILITOT 1.3* 1.4* 1.0 0.8 0.9  PROT 5.5* 5.3* 5.2* 5.1* 4.7*  ALBUMIN 2.7* 2.6* 2.5* 2.3* 2.3*   No results for input(s): LIPASE, AMYLASE in the last 168 hours. No results for input(s): AMMONIA in the last 168 hours. Coagulation Profile: No results for input(s): INR, PROTIME in the last 168 hours. Cardiac Enzymes: No results for input(s): CKTOTAL, CKMB, CKMBINDEX, TROPONINI in the last 168 hours. BNP (last 3 results) No results for input(s): PROBNP in the last 8760 hours. HbA1C: No results for input(s):  HGBA1C in the last 72 hours. CBG: Recent Labs  Lab 02/14/19 0757 02/14/19 0852 02/14/19 1130 02/14/19 1529 02/14/19 1700  GLUCAP 61* 75 81 99 94   Lipid Profile: No results for input(s): CHOL, HDL, LDLCALC, TRIG, CHOLHDL, LDLDIRECT in the last 72 hours. Thyroid Function Tests: Recent Labs    02/13/19 0157  TSH 14.313*  T4TOTAL 3.7*   Anemia Panel: No results for input(s): VITAMINB12, FOLATE, FERRITIN, TIBC, IRON, RETICCTPCT in the last 72 hours. Sepsis Labs: Recent Labs  Lab 02/10/19 0235 02/11/19 0730 02/12/19 0349 02/13/19 0157  PROCALCITON 0.62 0.58 0.57 0.49    Recent Results (from the past 240 hour(s))  SARS CORONAVIRUS 2 (TAT 6-24 HRS) Nasopharyngeal Nasopharyngeal Swab     Status: Abnormal   Collection Time: 02/07/19  4:13 PM   Specimen: Nasopharyngeal Swab  Result Value Ref Range Status   SARS Coronavirus 2 POSITIVE (A) NEGATIVE Final    Comment: RESULT CALLED TO, READ BACK BY AND VERIFIED WITH: RN KIM SPRINGERS AT 2255 BY [place] ON 02/07/2019 (NOTE) SARS-CoV-2 target nucleic acids are DETECTED. The SARS-CoV-2 RNA is generally detectable in upper and lower respiratory specimens during the acute phase of infection. Positive results are indicative of the presence of SARS-CoV-2 RNA. Clinical correlation with patient history and other diagnostic information is  necessary to determine patient infection status. Positive results do not rule out bacterial infection or co-infection with other viruses.  The expected result is Negative. Fact Sheet for Patients: SugarRoll.be Fact Sheet for Healthcare Providers: https://www.woods-mathews.com/ This test is not yet approved or cleared by the [place] FDA and  has been authorized for detection and/or diagnosis of SARS-CoV-2 by FDA under an Emergency Use Authorization (EUA). This EUA will remain  in effect (meaning this te st can be used) for the duration of  the COVID-19 declaration under Section 564(b)(1) of the Act, 21 U.S.C. section 360bbb-3(b)(1), unless the authorization is terminated or revoked sooner. Performed at [facility], [place] [address], [place], [place] [address]   MRSA PCR Screening     Status: None   Collection  Time: 02/11/19  1:21 AM   Specimen: Nasal Mucosa; Nasopharyngeal  Result Value Ref Range Status   MRSA by PCR NEGATIVE NEGATIVE Final    Comment:        The GeneXpert MRSA Assay (FDA approved for NASAL specimens only), is one component of a comprehensive MRSA colonization surveillance program. It is not intended to diagnose MRSA infection nor to guide or monitor treatment for MRSA infections. Performed at [facility], [place] [address], [place], [place] [address]          Radiology Studies: No results found.      Scheduled Meds: . vitamin C  500 mg Oral Daily  . calcitRIOL  1.75 mcg Oral Q T,Th,Sa-HD  . Chlorhexidine Gluconate Cloth  6 each Topical Q0600  . clonazePAM  0.25 mg Oral Daily  . clonazepam  0.25 mg Oral QHS  . dexamethasone (DECADRON) injection  6 mg Intravenous Daily  . docusate sodium  100 mg Oral q morning - 10a  . feeding supplement (NEPRO CARB STEADY)  237 mL Oral BID BM  . FLUoxetine  10 mg Oral Daily  . heparin      . heparin  7,500 Units Subcutaneous Q8H  . insulin aspart  0-5 Units Subcutaneous QHS  . insulin aspart  0-6 Units Subcutaneous TID WC  . Ipratropium-Albuterol  2 puff Inhalation TID  . levothyroxine  112 mcg Oral QAC breakfast  . midodrine      . midodrine  10 mg Oral BID  . montelukast  10 mg Oral QHS  . multivitamin  1 tablet Oral QHS  . mupirocin ointment   Topical Daily  . rosuvastatin  40 mg Oral QHS  . senna  1 tablet Oral q morning - 10a  . sevelamer carbonate  1,600 mg Oral TID WC  . sodium chloride flush  3 mL Intravenous Q12H  . zinc sulfate  220 mg Oral Daily   Continuous Infusions: . sodium chloride Stopped (02/14/19 1322)      LOS: 5 days        Hosie Poisson, MD [facility]    02/14/2019, 5:30 PM

## 2019-02-14 NOTE — Telephone Encounter (Signed)
New Rx sent.

## 2019-02-14 NOTE — Evaluation (Signed)
Occupational Therapy Evaluation Patient Details Name: Joan Mosley MRN: [id number] DOB: [birth year] Today's Date: 02/14/2019    History of Present Illness This 74 y.o. female admitted from HD 02/09/2019 center due to hypoxia.  Pt tested + for COVID on 02/07/2019.  Dx: COVID related PNA.  PMH includes: DM  , ESRD, COPD, Hypothyroidism   Clinical Impression   Pt admitted with above. She demonstrates the below listed deficits and will benefit from continued OT to maximize safety and independence with BADLs.  Pt seen with PT.  She was able to transfer to [facility] with mod  A+2, but fatigued quickly and required total A for peri care and max A +2 and use of stedy to transfer back to bed.  She requires mod - total A for all other aspects of ADLs.  Sa02 84% on 10L and mid 90s on 15L.   PTA, she lives with son, daughter, and grandson, who provide assist with ADLs and transfers from recliner to [facility] and w/c.  Due to the amount of assist she currently requires and how quickly she fatigues, am unsure that family will be able to provide level of care needed and be able to safely transport her to HD, and therefore, feel SNF is likely best option at discharge.  I am fairly certain pt will refuse this option.  If she chooses to discharge home, recommend hoyer lift, and feel she would benefit from a hospital bed.       Follow Up Recommendations  SNF;Supervision/Assistance - 24 hour    Equipment Recommendations  Hospital bed; hoyer lift    Recommendations for Other Services       Precautions / Restrictions Precautions Precautions: Fall      Mobility Bed Mobility Overal bed mobility: Needs Assistance Bed Mobility: Supine to Sit;Sit to Supine     Supine to sit: Mod assist;+2 for physical assistance;+2 for safety/equipment Sit to supine: Max assist;+2 for safety/equipment;+2 for physical assistance   General bed mobility comments: assist to move LEs off bed and to lift trunk then assist back to bed    Transfers Overall transfer level: Needs assistance Equipment used: 2 person hand held assist Transfers: Sit to/from [employer] Transfers Sit to Stand: Mod assist;+2 physical assistance;Max assist;+2 safety/equipment Stand pivot transfers: Max assist;+2 physical assistance;+2 safety/equipment       General transfer comment: Pt initially required mod A +2 to move into standing and to maintain balance to transfer to commode.  However, as she fatigued, she required increased assist and progressed to requiring max A +2 and use of stedy to return to bed     Balance Overall balance assessment: Needs assistance Sitting-balance support: Feet supported;Single extremity supported Sitting balance-Leahy Scale: Fair Sitting balance - Comments: able to maintain static sitting with min guard assist    Standing balance support: Bilateral upper extremity supported Standing balance-Leahy Scale: Poor Standing balance comment: Pt requires max A to maintain static standing for 15- 30 seconds.  She fatigues quickly and spontaneously sits                            ADL either performed or assessed with clinical judgement   ADL Overall ADL's : Needs assistance/impaired Eating/Feeding: Set up;Bed level   Grooming: Wash/dry hands;Wash/dry face;Oral care;Brushing hair;Minimal assistance;Sitting   Upper Body Bathing: Moderate assistance;Sitting   Lower Body Bathing: Total assistance;Sit to/from stand   Upper Body Dressing : Maximal assistance;Sitting   Lower Body Dressing: Total  assistance;Sit to/from stand   Toilet Transfer: Maximal assistance;+2 for physical assistance;Cueing for sequencing;Stand-pivot;BSC Toilet Transfer Details (indicate cue type and reason): Pt able to perform stand pivot transfer from bed to [facility] with mod A +2 initially, but as she fatigued, she required increased assist and required max A +2 to transfer back to bed  Toileting- Clothing Manipulation and Hygiene:  Total assistance;Sit to/from stand Toileting - Clothing Manipulation Details (indicate cue type and reason): Pt demonstrated difficulty maintaining standing long enough to complete peri care and required her to stand x 3 to complete task      Functional mobility during ADLs: Maximal assistance;+2 for physical assistance       Vision         Perception     Praxis      Pertinent Vitals/Pain Pain Assessment: Faces Faces Pain Scale: Hurts little more Pain Location: Lt lower abdomen  Pain Descriptors / Indicators: Grimacing;Guarding Pain Intervention(s): Monitored during session;Other (comment)(RN notified due to scrape in this area )     Hand Dominance Right   Extremity/Trunk Assessment Upper Extremity Assessment Upper Extremity Assessment: Generalized weakness   Lower Extremity Assessment Lower Extremity Assessment: Defer to PT evaluation       Communication Communication Communication: HOH   Cognition Arousal/Alertness: Awake/alert Behavior During Therapy: WFL for tasks assessed/performed Overall Cognitive Status: No family/caregiver present to determine baseline cognitive functioning                                 General Comments: Pt is slow to process info and frequently states "I don't know" or we will see"  She is HOH so that may contribute    General Comments  02 sats 84-86% on 10L hiflo 02;  sats improved to low to mid 90s on 15L hi flo 02, HR to 122.  RN present     Exercises     Shoulder Instructions      Home Living Family/patient expects to be discharged to:: Private residence Living Arrangements: Children Available Help at Discharge: Family;Available 24 hours/day Type of Home: House Home Access: Ramped entrance     Home Layout: One level     Bathroom Shower/Tub: Tub/shower unit;Walk-in shower         Home Equipment: Gilford Rile - 2 wheels;Cane - single point;Wheelchair - power;Electric scooter   Additional Comments:  supplemental O2 5L at baseline      Prior Functioning/Environment Level of Independence: Needs assistance  Gait / Transfers Assistance Needed: transfers with two children ADL's / Homemaking Assistance Needed: has help with all ADL's    Comments: Pt reports she spends her time in and sleeps in her recliner.  her children assist with ADLs and transfers to [facility] and w/c.         OT Problem List: Decreased strength;Decreased activity tolerance;Impaired balance (sitting and/or standing);Decreased cognition;Decreased safety awareness;Decreased knowledge of use of DME or AE;Cardiopulmonary status limiting activity;Obesity;Pain      OT Treatment/Interventions: Self-care/ADL training;Therapeutic exercise;Energy conservation;DME and/or AE instruction;Therapeutic activities;Cognitive remediation/compensation;Patient/family education;Balance training    OT Goals(Current goals can be found in the care plan section) Acute Rehab OT Goals Patient Stated Goal: I want to go home  OT Goal Formulation: With patient Time For Goal Achievement: 02/28/19 Potential to Achieve Goals: Good ADL Goals Pt Will Perform Grooming: with set-up;with supervision;sitting Pt Will Perform Upper Body Bathing: with mod assist;sitting Pt Will Perform Lower Body Bathing: with max assist;sit to/from stand  Pt Will Transfer to Toilet: with mod assist;stand pivot transfer;bedside commode Pt Will Perform Toileting - Clothing Manipulation and hygiene: with max assist;sit to/from stand Additional ADL Goal #1: Pt will actively participate in therapeutic activity x 15 mins with no more than 3 rest breaks  OT Frequency: Min 2X/week   Barriers to D/C: Decreased caregiver support  unsure family can provide necessary level of assist        Co-evaluation PT/OT/SLP Co-Evaluation/Treatment: Yes Reason for Co-Treatment: Complexity of the patient's impairments (multi-system involvement);For patient/therapist safety;To address  functional/ADL transfers   OT goals addressed during session: ADL's and self-care      AM-PAC OT "6 Clicks" Daily Activity     Outcome Measure Help from another person eating meals?: A Little Help from another person taking care of personal grooming?: A Little Help from another person toileting, which includes using toliet, bedpan, or urinal?: A Lot Help from another person bathing (including washing, rinsing, drying)?: A Lot Help from another person to put on and taking off regular upper body clothing?: A Lot Help from another person to put on and taking off regular lower body clothing?: Total 6 Click Score: 13   End of Session Equipment Utilized During Treatment: Gait belt;Oxygen Nurse Communication: Mobility status  Activity Tolerance: Patient limited by fatigue Patient left: in bed;with call bell/phone within reach;with bed alarm set;with nursing/sitter in room  OT Visit Diagnosis: Unsteadiness on feet (R26.81)                Time: [id number] OT Time Calculation (min): 64 min Charges:  OT General Charges $OT Visit: 1 Visit OT Evaluation $OT Eval Moderate Complexity: 1 Mod OT Treatments $Self Care/Home Management : 8-22 mins  Nilsa Nutting., OTR/L Acute Rehabilitation Services Pager [phone] Office [place], [place] 02/14/2019, 4:04 PM

## 2019-02-14 NOTE — Telephone Encounter (Signed)
Those are the same medications. You can try sending ventolin brand

## 2019-02-15 LAB — GLUCOSE, CAPILLARY
Glucose-Capillary: 105 mg/dL — ABNORMAL HIGH (ref 70–99)
Glucose-Capillary: 162 mg/dL — ABNORMAL HIGH (ref 70–99)
Glucose-Capillary: 137 mg/dL — ABNORMAL HIGH (ref 70–99)

## 2019-02-15 LAB — BASIC METABOLIC PANEL
Anion gap: 15 (ref 5–15)
BUN: 27 mg/dL — ABNORMAL HIGH (ref 8–23)
CO2: 24 mmol/L (ref 22–32)
Calcium: 8.8 mg/dL — ABNORMAL LOW (ref 8.9–10.3)
Chloride: 101 mmol/L (ref 98–111)
Creatinine, Ser: 4.69 mg/dL — ABNORMAL HIGH (ref 0.44–1.00)
GFR calc Af Amer: 10 mL/min — ABNORMAL LOW (ref >60)
GFR calc non Af Amer: 9 mL/min — ABNORMAL LOW (ref >60)
Glucose, Bld: 77 mg/dL (ref 70–99)
Potassium: 3.9 mmol/L (ref 3.5–5.1)
Sodium: 140 mmol/L (ref 135–145)

## 2019-02-15 LAB — PHOSPHORUS: Phosphorus: 4.8 mg/dL — ABNORMAL HIGH (ref 2.5–4.6)

## 2019-02-15 NOTE — Progress Notes (Signed)
PROGRESS NOTE    Joanmarie Siddiq  [id number] DOB: 1945-05-11 DOA: 02/09/2019 PCP: Flossie Buffy, NP    Brief Narrative: 74 year old lady with prior history of end-stage renal disease on dialysis, COPD,, chronic respiratory failure on 5 L of home oxygen, type 2 diabetes mellitus, hypothyroidism, TIA presented to ED from dialysis for evaluation of hypoxia.  Patient was tested positive for COVID-19 on 02/07/2019 and 20 minutes into her dialysis she was noted to be hypoxic and was brought to ED for further evaluation.  On arrival to ED patient was saturating 88% on nonrebreather and she was switched to 20 L of high flow oxygen and sats have been around 92%.  Chest x-ray showed asymmetric pulmonary opacities compatible with COVID-19 pneumonia mildly progressed in the lower lungs compared to prior chest x-ray from 2 days ago. She is now admitted for further evaluation and management on acute on chronic respiratory failure with hypoxia secondary to COVID-19 multifocal pneumonia. Nephrology consulted for dialysis. She underwent HD and her breathing has improved. We were able to wean her down to 9 lit of HF oxygen and her sats have remained well above 95%.  Meanwhile palliative care consulted for [place], plan for outpatient palliative care follow up .  Of note patient had missed one HD session and missed more than a week of her synthroid tablets while waiting for her scripts to be filled. Recommend rechecking thyroid panel in 3 to 4 weeks post discharge and adjusting medications as needed.  Pt seen and examined, still on 10 lit of HF oxygen but stable.   Assessment & Plan:   Principal Problem:   Pneumonia due to COVID-19 virus Active Problems:   Type 2 diabetes mellitus with diabetic polyneuropathy, with long-term current use of insulin (HCC)   ESRD (end stage renal disease) (HCC)   Acute on chronic respiratory failure with hypoxia (HCC)   COPD (chronic obstructive pulmonary disease) ([place])  COVID-19   Acute on chronic respiratory failure with hypoxia secondary to COVID-19 multifocal pneumonia Patient tested positive for Covid on 02/07/2019.  She initially required up to 20 L of high flow nasal cannula oxygen but after dialysis was weaned down to 10 L of high flow oxygen.  PT evaluation ordered recommending SNF. CM consulted, family considering home with DME and HH.    -continue with incentive spirometry and flutter valve.  LDH is within normal limits CRP is 4.1, pro calcitonin less than 0.5, WBC count within normal limits, MRSA PCR is negative. Chest x-ray on admission showed asymmetric pulmonary opacity compatible with COVID-19 pneumonia  Mild progression in both lower lungs since 02/07/2019. Continue IV Decadron 6 mg daily Completing IV remdesivir She will need to quarantine till 02/27/19. She remains afebrile and wbc count within normal limits.    Hypothyroidism Continue with Synthroid 112 MCG daily. TSH abnormal and high at 14.  As per the son patient missed her Synthroid for more than a week while waiting for her prescription to be filled out.  Recommend outpatient follow-up with PCP for repeat thyroid panel in about 3 to 4 weeks.   End-stage renal disease on dialysis Tuesday Thursday and Saturday. Volume management as per dialysis Nephrology following, HD per their recommendations.   Insulin-dependent diabetes mellitus with polyneuropathy and chronic kidney disease CBG (last 3)  Recent Labs    02/14/19 2140 02/15/19 1323 02/15/19 1725  GLUCAP 113* 105* 162*   Discontinued the lantus as her CBG is in 60's this am.  Continue with SSI. Last A1c  is 5.6.    Body mass index is 39.99 kg/m. Morbidly obese DVT prophylaxis heparin  7500 TID.     History of COPD No wheezing heard Continue with bronchodilators as needed.   Pressure injury on admission.   Pressure Injury 02/13/19 Heel Left Deep Tissue Pressure Injury - Purple or maroon localized area of  discolored intact skin or blood-filled blister due to damage of underlying soft tissue from pressure and/or shear. (Active)  02/13/19 1000  Location: Heel  Location Orientation: Left  Staging: Deep Tissue Pressure Injury - Purple or maroon localized area of discolored intact skin or blood-filled blister due to damage of underlying soft tissue from pressure and/or shear.  Wound Description (Comments):   Present on Admission:    Wound care consulted.       DVT prophylaxis: Heparin Code Status: Full code Family Communication: None at bedside, daughter had been updated Disposition Plan: pending improvement in hypoxia, plan for SNF when is medically stable.   Consultants:   Nephrology  Palliative care consulted  Wound care.   Procedures: None Antimicrobials: None  Subjective: She feels tired. She has dyspnea with slight activity, movement. Denies diarrhea or nausea.   Objective: Vitals:   02/15/19 1403 02/15/19 1449 02/15/19 1505 02/15/19 1600  BP:   (!) 144/59   Pulse: 85 75 73   Resp: (!) 24     Temp:  97.7 F (36.5 C) 97.6 F (36.4 C)   TempSrc:  Axillary Oral   SpO2: 91% 94% 100% 94%  Weight:        Intake/Output Summary (Last 24 hours) at 02/15/2019 1842 Last data filed at 02/15/2019 1600 Gross per 24 hour  Intake 420 ml  Output -  Net 420 ml   Filed Weights   02/11/19 1330 02/14/19 0737 02/14/19 1100  Weight: 99 kg 98 kg 96 kg    Examination:  General exam: Resting in bed, in NAD   Respiratory system: no respiratory distress, shallow breaths, no wheezing.  Cardiovascular system: S1S2 present, RRR. Marland Kitchen  Gastrointestinal system: Abdomen is soft, non tender non distended Central nervous system: arousable,  oriented, able to answer all questions appropriately, follows commands. No facial droop.   Extremities: no cyanosis or clubbing.  Skin: left heel deep injury.  Psychiatry: mood is appropriate.     Data Reviewed: I have personally reviewed following  labs and imaging studies  CBC: Recent Labs  Lab 02/10/19 0235 02/10/19 0235 02/10/19 1712 02/11/19 0730 02/12/19 0349 02/13/19 0157 02/14/19 0221  WBC 7.1  --   --  4.5 4.6 5.5 7.3  NEUTROABS 6.3  --   --  3.0 3.1 3.1 4.7  HGB 9.8*   < > 9.9* 9.6* 9.2* 9.3* 9.4*  HCT 32.1*   < > 29.0* 31.6* 30.6* 29.9* 30.2*  MCV 97.0  --   --  96.3 96.8 94.6 93.5  PLT 132*  --   --  132* 162 181 189   < > = values in this interval not displayed.   Basic Metabolic Panel: Recent Labs  Lab 02/11/19 0730 02/12/19 0349 02/13/19 0157 02/14/19 0221 02/15/19 0625  NA 137 136 136 135 140  K 4.6 4.2 4.2 4.5 3.9  CL 94* 96* 96* 96* 101  CO2 22 26 24 23 24   GLUCOSE 83 97 77 88 77  BUN 37* 22 38* 51* 27*  CREATININE 6.01* 4.11* 5.91* 7.29* 4.69*  CALCIUM 8.3* 8.4* 8.3* 8.4* 8.8*  PHOS  --   --   --   --  4.8*   GFR: Estimated Creatinine Clearance: 11 mL/min (A) (by C-G formula based on SCr of 4.69 mg/dL (H)). Liver Function Tests: Recent Labs  Lab 02/09/19 1946 02/10/19 0235 02/11/19 0730 02/12/19 0349 02/13/19 0157  AST 24 18 22 22 20   ALT 13 13 12 15 12   ALKPHOS 86 77 63 67 63  BILITOT 1.3* 1.4* 1.0 0.8 0.9  PROT 5.5* 5.3* 5.2* 5.1* 4.7*  ALBUMIN 2.7* 2.6* 2.5* 2.3* 2.3*   No results for input(s): LIPASE, AMYLASE in the last 168 hours. No results for input(s): AMMONIA in the last 168 hours. Coagulation Profile: No results for input(s): INR, PROTIME in the last 168 hours. Cardiac Enzymes: No results for input(s): CKTOTAL, CKMB, CKMBINDEX, TROPONINI in the last 168 hours. BNP (last 3 results) No results for input(s): PROBNP in the last 8760 hours. HbA1C: Recent Labs    02/14/19 1830  HGBA1C 6.0*   CBG: Recent Labs  Lab 02/14/19 1529 02/14/19 1700 02/14/19 2140 02/15/19 1323 02/15/19 1725  GLUCAP 99 94 113* 105* 162*   Lipid Profile: No results for input(s): CHOL, HDL, LDLCALC, TRIG, CHOLHDL, LDLDIRECT in the last 72 hours. Thyroid Function Tests: Recent Labs     02/13/19 0157  TSH 14.313*  T4TOTAL 3.7*   Anemia Panel: No results for input(s): VITAMINB12, FOLATE, FERRITIN, TIBC, IRON, RETICCTPCT in the last 72 hours. Sepsis Labs: Recent Labs  Lab 02/10/19 0235 02/11/19 0730 02/12/19 0349 02/13/19 0157  PROCALCITON 0.62 0.58 0.57 0.49    Recent Results (from the past 240 hour(s))  SARS CORONAVIRUS 2 (TAT 6-24 HRS) Nasopharyngeal Nasopharyngeal Swab     Status: Abnormal   Collection Time: 02/07/19  4:13 PM   Specimen: Nasopharyngeal Swab  Result Value Ref Range Status   SARS Coronavirus 2 POSITIVE (A) NEGATIVE Final    Comment: RESULT CALLED TO, READ BACK BY AND VERIFIED WITH: RN KIM SPRINGERS AT 2255 BY [place] ON 02/07/2019 (NOTE) SARS-CoV-2 target nucleic acids are DETECTED. The SARS-CoV-2 RNA is generally detectable in upper and lower respiratory specimens during the acute phase of infection. Positive results are indicative of the presence of SARS-CoV-2 RNA. Clinical correlation with patient history and other diagnostic information is  necessary to determine patient infection status. Positive results do not rule out bacterial infection or co-infection with other viruses.  The expected result is Negative. Fact Sheet for Patients: SugarRoll.be Fact Sheet for Healthcare Providers: https://www.woods-mathews.com/ This test is not yet approved or cleared by the [place] FDA and  has been authorized for detection and/or diagnosis of SARS-CoV-2 by FDA under an Emergency Use Authorization (EUA). This EUA will remain  in effect (meaning this te st can be used) for the duration of the COVID-19 declaration under Section 564(b)(1) of the Act, 21 U.S.C. section 360bbb-3(b)(1), unless the authorization is terminated or revoked sooner. Performed at [facility], [place] [address], [place], [place] [address]   MRSA PCR Screening     Status: None   Collection Time: 02/11/19  1:21 AM    Specimen: Nasal Mucosa; Nasopharyngeal  Result Value Ref Range Status   MRSA by PCR NEGATIVE NEGATIVE Final    Comment:        The GeneXpert MRSA Assay (FDA approved for NASAL specimens only), is one component of a comprehensive MRSA colonization surveillance program. It is not intended to diagnose MRSA infection nor to guide or monitor treatment for MRSA infections. Performed at [facility], [place] [address], [place], [place] [address]  Radiology Studies: No results found.      Scheduled Meds: . vitamin C  500 mg Oral Daily  . calcitRIOL  1.75 mcg Oral Q T,Th,Sa-HD  . Chlorhexidine Gluconate Cloth  6 each Topical Q0600  . clonazePAM  0.25 mg Oral Daily  . clonazepam  0.25 mg Oral QHS  . dexamethasone (DECADRON) injection  6 mg Intravenous Daily  . docusate sodium  100 mg Oral q morning - 10a  . feeding supplement (NEPRO CARB STEADY)  237 mL Oral BID BM  . FLUoxetine  10 mg Oral Daily  . heparin  7,500 Units Subcutaneous Q8H  . insulin aspart  0-5 Units Subcutaneous QHS  . insulin aspart  0-6 Units Subcutaneous TID WC  . Ipratropium-Albuterol  2 puff Inhalation TID  . levothyroxine  112 mcg Oral QAC breakfast  . midodrine  10 mg Oral BID  . montelukast  10 mg Oral QHS  . multivitamin  1 tablet Oral QHS  . mupirocin ointment   Topical Daily  . rosuvastatin  40 mg Oral QHS  . senna  1 tablet Oral q morning - 10a  . sevelamer carbonate  1,600 mg Oral TID WC  . sodium chloride flush  3 mL Intravenous Q12H  . zinc sulfate  220 mg Oral Daily   Continuous Infusions: . sodium chloride Stopped (02/14/19 1322)     LOS: 6 days     Time spent: 35 minutes   Blain Pais, MD [facility]    02/15/2019, 6:42 PM

## 2019-02-15 NOTE — TOC Initial Note (Deleted)
Transition of Care [facility]) - Initial/Assessment Note    Patient Details  Name: Joan Mosley MRN: [id number] Date of Birth: 04-25-1945  Transition of Care [facility]) CM/SW Contact:    Ralph Dowdy, [place] Work Phone Number: 02/15/2019, 1:54 PM  Clinical Narrative:                   Expected Discharge Plan: [place] Barriers to Discharge: Continued Medical Work up   Patient Goals and CMS Choice Patient states their goals for this hospitalization and ongoing recovery are:: Get better enough to go home and be COVID negative CMS Medicare.gov Compare Post Acute Care list provided to:: Other (Comment Required)(Daughter [name])) Choice offered to / list presented to : Adult Children  Expected Discharge Plan and Services Expected Discharge Plan: [place] In-house Referral: Clinical Social Work Discharge Planning Services: CM Consult Post Acute Care Choice: [place] arrangements for the past 2 months: [place]                   DME Agency: [place]                  Prior Living Arrangements/Services Living arrangements for the past 2 months: Single Family Home Lives with:: Adult Children Patient language and need for interpreter reviewed:: Yes Do you feel safe going back to the place where you live?: Yes      Need for Family Participation in Patient Care: Yes (Comment) Care giver support system in place?: Yes (comment)   Criminal Activity/Legal Involvement Pertinent to Current Situation/Hospitalization: No - Comment as needed  Activities of Daily Living      Permission Sought/Granted Permission sought to share information with : Family Supports Permission granted to share information with : Yes, Verbal Permission Granted  Share Information with NAME: [name] (Daughter)           Emotional Assessment   Attitude/Demeanor/Rapport: Engaged Affect (typically observed): Accepting,  Stable Orientation: : Oriented to Self, Oriented to Place, Oriented to  Time, Oriented to Situation Alcohol / Substance Use: Not Applicable Psych Involvement: No (comment)  Admission diagnosis:  Shortness of breath [R06.02] COVID-19 virus infection [U07.1] Pneumonia due to COVID-19 virus [U07.1, J12.82] COVID-19 [U07.1] Patient Active Problem List   Diagnosis Date Noted  . COVID-19 02/10/2019  . Pneumonia due to COVID-19 virus 02/09/2019  . Hypotension 12/24/2018  . Aspiration pneumonia of right lower lobe ([place])   . Palliative care encounter   . COPD (chronic obstructive pulmonary disease) ([place]) 11/11/2018  . TIA (transient ischemic attack)   . HLD (hyperlipidemia)   . Type II diabetes mellitus with renal manifestations ([place])   . Depression with anxiety   . Acute on chronic respiratory failure with hypoxia ([place]) 09/24/2018  . Advanced care planning/counseling discussion   . Goals of care, counseling/discussion   . Palliative care by specialist   . Acute on chronic respiratory failure with hypoxia and hypercapnia ([place]) 09/15/2018  . Pulmonary edema 09/08/2018  . Respiratory failure ([place]) 09/08/2018  . Weakness   . SDH (subdural hematoma) ([place]) 09/06/2018  . Chronic respiratory failure with hypoxia, on home O2 therapy ([place]) 09/06/2018  . Subdural hematoma ([place]) 08/22/2018  . Elevated troponin 08/22/2018  . ESRD (end stage renal disease) ([place]) 08/22/2018  . Anxiety 07/04/2018  . Counseling regarding advanced directives and goals of care 07/01/2018  . Full code status 07/01/2018  . Drug-induced constipation 07/01/2018  . Hemodialysis-associated hypotension  07/01/2018  . Dialysis patient ([place]) 07/01/2018  . HOH (hard of hearing) 07/01/2018  . Stage 5 chronic kidney disease on chronic dialysis ([place]) 07/01/2018  . Type 2 diabetes mellitus with diabetic polyneuropathy, with long-term current use of insulin ([place]) 07/01/2018  . Hypothyroidism 07/01/2018  . Gait instability  07/01/2018  . Hyperkalemia 04/20/2018  . Cerebral infarction, unspecified ([place]) 04/05/2018  . Chronic diastolic (congestive) heart failure ([place]) 04/05/2018  . Diarrhea, unspecified 04/05/2018  . Pain, unspecified 04/05/2018  . Pruritus, unspecified 04/05/2018  . Shortness of breath 04/05/2018  . Coagulation defect, unspecified ([place]) 03/31/2018  . Anemia in chronic kidney disease 03/25/2018  . Iron deficiency anemia, unspecified 03/25/2018  . Secondary hyperparathyroidism of renal origin ([place]) 03/25/2018   PCP:  Flossie Buffy, NP Pharmacy:   [facility] ([facility]), [place] [id number] [place]) [place] [address] Phone: [phone] Fax: [phone]     Social Determinants of Health (SDOH) Interventions    Readmission Risk Interventions Readmission Risk Prevention Plan 10/04/2018  Transportation Screening Complete  Medication Review ([place]) Complete  PCP or Specialist appointment within 3-5 days of discharge Complete  HRI or [place] Complete  SW Recovery Care/Counseling Consult Complete  Palliative Care Screening Complete  [place] Patient Refused

## 2019-02-15 NOTE — TOC Initial Note (Addendum)
Transition of Care [facility]) - Initial/Assessment Note    Patient Details  Name: Joan Mosley MRN: [id number] Date of Birth: Nov 18, 1945  Transition of Care [facility]) CM/SW Contact:    Ralph Dowdy, [place] Work Phone Number: 02/15/2019, 1:36 PM  Clinical Narrative:                 CSW called pt's room. Pt was unable to hear CSW because pt is very hard of hearing. CSW then contacted daughter who provides care for her mother. Intern CSW informed daughter [name]) that PT recommended SNF. [name] stated that she takes care of her mother 24/7. She stated that she would rather her mother come home because she is able to provide her care at their home. CSW asked if they preferred any specific home health agency. [name] stated that they had used [facility] in the past, and would like to use them again if possible. CSW asked if they had transportation to get pt to and from dialysis. [name] stated that they have an ambulance that transported her to and from her dialysis. [name] was very cooperative and seemed engaged in her mothers care. CSW informed daughter that pt was maxim care, and she stated that she was at baseline. [name] has a bedside commode and all equipment needed, including a hoyer lift pad, but are requesting a hoyer lift. CSW will continue to follow up.         Patient Goals and CMS Choice        Expected Discharge Plan and Services                                                Prior Living Arrangements/Services                       Activities of Daily Living      Permission Sought/Granted                  Emotional Assessment              Admission diagnosis:  Shortness of breath [R06.02] COVID-19 virus infection [U07.1] Pneumonia due to COVID-19 virus [U07.1, J12.82] COVID-19 [U07.1] Patient Active Problem List   Diagnosis Date Noted  . COVID-19 02/10/2019  . Pneumonia due to COVID-19 virus 02/09/2019  . Hypotension 12/24/2018  . Aspiration  pneumonia of right lower lobe ([place])   . Palliative care encounter   . COPD (chronic obstructive pulmonary disease) ([place]) 11/11/2018  . TIA (transient ischemic attack)   . HLD (hyperlipidemia)   . Type II diabetes mellitus with renal manifestations ([place])   . Depression with anxiety   . Acute on chronic respiratory failure with hypoxia ([place]) 09/24/2018  . Advanced care planning/counseling discussion   . Goals of care, counseling/discussion   . Palliative care by specialist   . Acute on chronic respiratory failure with hypoxia and hypercapnia ([place]) 09/15/2018  . Pulmonary edema 09/08/2018  . Respiratory failure ([place]) 09/08/2018  . Weakness   . SDH (subdural hematoma) ([place]) 09/06/2018  . Chronic respiratory failure with hypoxia, on home O2 therapy ([place]) 09/06/2018  . Subdural hematoma ([place]) 08/22/2018  . Elevated troponin 08/22/2018  . ESRD (end stage renal disease) ([place]) 08/22/2018  . Anxiety 07/04/2018  . Counseling regarding advanced directives and goals of care 07/01/2018  . Full code status 07/01/2018  .  Drug-induced constipation 07/01/2018  . Hemodialysis-associated hypotension 07/01/2018  . Dialysis patient ([place]) 07/01/2018  . HOH (hard of hearing) 07/01/2018  . Stage 5 chronic kidney disease on chronic dialysis ([place]) 07/01/2018  . Type 2 diabetes mellitus with diabetic polyneuropathy, with long-term current use of insulin ([place]) 07/01/2018  . Hypothyroidism 07/01/2018  . Gait instability 07/01/2018  . Hyperkalemia 04/20/2018  . Cerebral infarction, unspecified ([place]) 04/05/2018  . Chronic diastolic (congestive) heart failure ([place]) 04/05/2018  . Diarrhea, unspecified 04/05/2018  . Pain, unspecified 04/05/2018  . Pruritus, unspecified 04/05/2018  . Shortness of breath 04/05/2018  . Coagulation defect, unspecified ([place]) 03/31/2018  . Anemia in chronic kidney disease 03/25/2018  . Iron deficiency anemia, unspecified 03/25/2018  . Secondary hyperparathyroidism of renal origin  ([place]) 03/25/2018   PCP:  Flossie Buffy, NP Pharmacy:   [facility] ([facility]), [place] - [id number] [place] [address] Phone: [phone] Fax: [phone]     Social Determinants of Health (SDOH) Interventions    Readmission Risk Interventions Readmission Risk Prevention Plan 10/04/2018  Transportation Screening Complete  Medication Review ([place]) Complete  PCP or Specialist appointment within 3-5 days of discharge Complete  HRI or [place] Complete  SW Recovery Care/Counseling Consult Complete  Palliative Care Screening Complete  [place] Patient Refused

## 2019-02-15 NOTE — Progress Notes (Signed)
[facility] Progress Note   Home meds: - furosemide 80 qd/ midodrine 10 TTS pre hd - sevelamer carb ac tid - clonazepam 0.25 am and 0.50 pm/ fluoxetine 10 qd - rosuvastatin 40 hs - montelukast 10 hs/ levalbuterol tid prn/ home O2 3L - levothyroxine 112 ug - insulin detemir 15 qd/ lispro SSI tid   Outpt HD:TTS G-O  4h 400/800 94kg 2/2 bath TDC R - sensipar 60 po - venofer 50 /wk - mircera 75 ug q 2wk, last 1/14 - calc 1.75 ug tiw  Assessment/ Plan:   1. COVID + PNA - tested + on 1/12, will need to quarantine until 02/27/19. Getting IV steroids / remdesivir and IV vanc / aztreonam for poss CAP 2. ESRD - on HD TTS at COVID shift.   1/19 HD net UF 2.3  Next HD tomorrow  3. Hypotension/ volume - chronic hypotension on midodrine, is up 4-5 kg by wts, unable to pull much w/ hypotension.Changedmidodrine from pre HDto 10mg  bid daily.  4. DM 2 - on insulin 5. Anemia ckd - had esa yest 6. Renal osteodystrophy - ordered a phos for 1/20 -> 4.8 (within goal) 7. COPD /chron resp failure - on home O2 5L 8. Pall Care - pt is enrolled in a community-based Palliative Care program at home. Consider PC consult for [place].  Subjective:   Asking when she can go home; I let her know she's still on signif O2 (she was already on home O2 5L outside the hospital). Denies f/c/n/v/cp.   Objective:   BP 135/76 (BP Location: Right Arm)   Pulse 80   Temp 97.7 F (36.5 C) (Oral)   Resp 17   Wt 96 kg   SpO2 93%   BMI 39.99 kg/m   Intake/Output Summary (Last 24 hours) at 02/15/2019 [id number] Last data filed at 02/14/2019 1406 Gross per 24 hour  Intake 214.03 ml  Output 2300 ml  Net -2085.97 ml   Weight change:   Physical Exam: Gen pleasant No jvd or bruits Chest clear bilatbilat, distant BS RRR no MRG Abd soft ntnd no mass or ascites +bsobese Extchronic 1+ distal pretib bilatedema Neuro isawake but hard of hearing,nonfocal Access RIJ  TC  Imaging: No results found.  Labs: BMET Recent Labs  Lab 02/09/19 1946 02/09/19 1957 02/10/19 0235 02/10/19 1712 02/11/19 0730 02/12/19 0349 02/13/19 0157 02/14/19 0221 02/15/19 0625  NA 136   < > 135 132* 137 136 136 135 140  K 4.1   < > 4.4 5.0 4.6 4.2 4.2 4.5 3.9  CL 92*  --  94*  --  94* 96* 96* 96* 101  CO2 26  --  25  --  22 26 24 23 24   GLUCOSE 105*  --  119*  --  83 97 77 88 77  BUN 15  --  18  --  37* 22 38* 51* 27*  CREATININE 4.04*  --  4.36*  --  6.01* 4.11* 5.91* 7.29* 4.69*  CALCIUM 8.1*  --  8.3*  --  8.3* 8.4* 8.3* 8.4* 8.8*  PHOS  --   --   --   --   --   --   --   --  4.8*   < > = values in this interval not displayed.   CBC Recent Labs  Lab 02/11/19 0730 02/12/19 0349 02/13/19 0157 02/14/19 0221  WBC 4.5 4.6 5.5 7.3  NEUTROABS 3.0 3.1 3.1 4.7  HGB 9.6* 9.2* 9.3* 9.4*  HCT  31.6* 30.6* 29.9* 30.2*  MCV 96.3 96.8 94.6 93.5  PLT 132* 162 181 189    Medications:    . vitamin C  500 mg Oral Daily  . calcitRIOL  1.75 mcg Oral Q T,Th,Sa-HD  . Chlorhexidine Gluconate Cloth  6 each Topical Q0600  . clonazePAM  0.25 mg Oral Daily  . clonazepam  0.25 mg Oral QHS  . dexamethasone (DECADRON) injection  6 mg Intravenous Daily  . docusate sodium  100 mg Oral q morning - 10a  . feeding supplement (NEPRO CARB STEADY)  237 mL Oral BID BM  . FLUoxetine  10 mg Oral Daily  . heparin  7,500 Units Subcutaneous Q8H  . insulin aspart  0-5 Units Subcutaneous QHS  . insulin aspart  0-6 Units Subcutaneous TID WC  . Ipratropium-Albuterol  2 puff Inhalation TID  . levothyroxine  112 mcg Oral QAC breakfast  . midodrine  10 mg Oral BID  . montelukast  10 mg Oral QHS  . multivitamin  1 tablet Oral QHS  . mupirocin ointment   Topical Daily  . rosuvastatin  40 mg Oral QHS  . senna  1 tablet Oral q morning - 10a  . sevelamer carbonate  1,600 mg Oral TID WC  . sodium chloride flush  3 mL Intravenous Q12H  . zinc sulfate  220 mg Oral Daily      Otelia Santee,  MD 02/15/2019, 8:23 AM

## 2019-02-16 LAB — GLUCOSE, CAPILLARY
Glucose-Capillary: 85 mg/dL (ref 70–99)
Glucose-Capillary: 79 mg/dL (ref 70–99)
Glucose-Capillary: 116 mg/dL — ABNORMAL HIGH (ref 70–99)
Glucose-Capillary: 105 mg/dL — ABNORMAL HIGH (ref 70–99)

## 2019-02-16 LAB — BASIC METABOLIC PANEL
Anion gap: 15 (ref 5–15)
BUN: 44 mg/dL — ABNORMAL HIGH (ref 8–23)
CO2: 22 mmol/L (ref 22–32)
Calcium: 8.9 mg/dL (ref 8.9–10.3)
Chloride: 101 mmol/L (ref 98–111)
Creatinine, Ser: 6.48 mg/dL — ABNORMAL HIGH (ref 0.44–1.00)
GFR calc Af Amer: 7 mL/min — ABNORMAL LOW (ref >60)
GFR calc non Af Amer: 6 mL/min — ABNORMAL LOW (ref >60)
Glucose, Bld: 76 mg/dL (ref 70–99)
Potassium: 5.6 mmol/L — ABNORMAL HIGH (ref 3.5–5.1)
Sodium: 138 mmol/L (ref 135–145)

## 2019-02-16 MED ORDER — HEPARIN SODIUM (PORCINE) 1000 UNIT/ML IJ SOLN
INTRAMUSCULAR | Status: AC
  Filled 2019-02-16: qty 1

## 2019-02-16 NOTE — TOC Initial Note (Deleted)
Transition of Care [facility]) - Initial/Assessment Note    Patient Details  Name: Joan Mosley MRN: [id number] Date of Birth: 11-26-1945  [facility]) CM/SW Contact:    Ralph Dowdy, [place] Work Phone Number: 02/16/2019, 9:50 AM  Clinical Narrative:                   Expected Discharge Plan: [place] Barriers to Discharge: Continued Medical Work up   Patient Goals and CMS Choice Patient states their goals for this hospitalization and ongoing recovery are:: Get better enough to go home and be COVID negative CMS Medicare.gov Compare Post Acute Care list provided to:: Other (Comment Required)(Daughter [name])) Choice offered to / list presented to : Adult Children  Expected Discharge Plan and Services Expected Discharge Plan: [place] In-house Referral: Clinical Social Work Discharge Planning Services: CM Consult Post Acute Care Choice: [place] arrangements for the past 2 months: [place]                   DME Agency: [place]                  Prior Living Arrangements/Services Living arrangements for the past 2 months: Single Family Home Lives with:: Adult Children Patient language and need for interpreter reviewed:: Yes Do you feel safe going back to the place where you live?: Yes      Need for Family Participation in Patient Care: Yes (Comment) Care giver support system in place?: Yes (comment)   Criminal Activity/Legal Involvement Pertinent to Current Situation/Hospitalization: No - Comment as needed  Activities of Daily Living      Permission Sought/Granted Permission sought to share information with : Family Supports Permission granted to share information with : Yes, Verbal Permission Granted  Share Information with NAME: [name] (Daughter)           Emotional Assessment   Attitude/Demeanor/Rapport: Engaged Affect (typically observed): Accepting,  Stable Orientation: : Oriented to Self, Oriented to Place, Oriented to  Time, Oriented to Situation Alcohol / Substance Use: Not Applicable Psych Involvement: No (comment)  Admission diagnosis:  Shortness of breath [R06.02] COVID-19 virus infection [U07.1] Pneumonia due to COVID-19 virus [U07.1, J12.82] COVID-19 [U07.1] Patient Active Problem List   Diagnosis Date Noted  . COVID-19 02/10/2019  . Pneumonia due to COVID-19 virus 02/09/2019  . Hypotension 12/24/2018  . Aspiration pneumonia of right lower lobe ([place])   . Palliative care encounter   . COPD (chronic obstructive pulmonary disease) ([place]) 11/11/2018  . TIA (transient ischemic attack)   . HLD (hyperlipidemia)   . Type II diabetes mellitus with renal manifestations ([place])   . Depression with anxiety   . Acute on chronic respiratory failure with hypoxia ([place]) 09/24/2018  . Advanced care planning/counseling discussion   . Goals of care, counseling/discussion   . Palliative care by specialist   . Acute on chronic respiratory failure with hypoxia and hypercapnia ([place]) 09/15/2018  . Pulmonary edema 09/08/2018  . Respiratory failure ([place]) 09/08/2018  . Weakness   . SDH (subdural hematoma) ([place]) 09/06/2018  . Chronic respiratory failure with hypoxia, on home O2 therapy ([place]) 09/06/2018  . Subdural hematoma ([place]) 08/22/2018  . Elevated troponin 08/22/2018  . ESRD (end stage renal disease) ([place]) 08/22/2018  . Anxiety 07/04/2018  . Counseling regarding advanced directives and goals of care 07/01/2018  . Full code status 07/01/2018  . Drug-induced constipation 07/01/2018  . Hemodialysis-associated hypotension  07/01/2018  . Dialysis patient ([place]) 07/01/2018  . HOH (hard of hearing) 07/01/2018  . Stage 5 chronic kidney disease on chronic dialysis ([place]) 07/01/2018  . Type 2 diabetes mellitus with diabetic polyneuropathy, with long-term current use of insulin ([place]) 07/01/2018  . Hypothyroidism 07/01/2018  . Gait instability  07/01/2018  . Hyperkalemia 04/20/2018  . Cerebral infarction, unspecified ([place]) 04/05/2018  . Chronic diastolic (congestive) heart failure ([place]) 04/05/2018  . Diarrhea, unspecified 04/05/2018  . Pain, unspecified 04/05/2018  . Pruritus, unspecified 04/05/2018  . Shortness of breath 04/05/2018  . Coagulation defect, unspecified ([place]) 03/31/2018  . Anemia in chronic kidney disease 03/25/2018  . Iron deficiency anemia, unspecified 03/25/2018  . Secondary hyperparathyroidism of renal origin ([place]) 03/25/2018   PCP:  Flossie Buffy, NP Pharmacy:   [facility] ([facility]), [place] - [id number] [place] [address] Phone: [phone] Fax: [phone]     Social Determinants of Health (SDOH) Interventions    Readmission Risk Interventions Readmission Risk Prevention Plan 10/04/2018  Transportation Screening Complete  Medication Review ([place]) Complete  PCP or Specialist appointment within 3-5 days of discharge Complete  HRI or [place] Complete  SW Recovery Care/Counseling Consult Complete  Palliative Care Screening Complete  [place] Patient Refused

## 2019-02-16 NOTE — Progress Notes (Signed)
PROGRESS NOTE    Joan Mosley  [id number] DOB: May 07, 1945 DOA: 02/09/2019 PCP: Flossie Buffy, NP    Brief Narrative: 74 year old lady with prior history of end-stage renal disease on dialysis, COPD,, chronic respiratory failure on 5 L of home oxygen, type 2 diabetes mellitus, hypothyroidism, TIA presented to ED from dialysis for evaluation of hypoxia.  Patient was tested positive for COVID-19 on 02/07/2019 and 20 minutes into her dialysis she was noted to be hypoxic and was brought to ED for further evaluation.  On arrival to ED patient was saturating 88% on nonrebreather and she was switched to 20 L of high flow oxygen and sats have been around 92%.  Chest x-ray showed asymmetric pulmonary opacities compatible with COVID-19 pneumonia mildly progressed in the lower lungs compared to prior chest x-ray from 2 days ago. She is now admitted for further evaluation and management on acute on chronic respiratory failure with hypoxia secondary to COVID-19 multifocal pneumonia. Nephrology consulted for dialysis. She underwent HD and her breathing has improved. We were able to wean her down to 9 lit of HF oxygen and her sats have remained well above 95%.  Meanwhile palliative care consulted for [place], plan for outpatient palliative care follow up .  Of note patient had missed one HD session and missed more than a week of her synthroid tablets while waiting for her scripts to be filled. Recommend rechecking thyroid panel in 3 to 4 weeks post discharge and adjusting medications as needed.  Pt seen and examined, still on 10 lit of HF oxygen but stable.   Assessment & Plan:   Principal Problem:   Pneumonia due to COVID-19 virus Active Problems:   Type 2 diabetes mellitus with diabetic polyneuropathy, with long-term current use of insulin (HCC)   ESRD (end stage renal disease) (HCC)   Acute on chronic respiratory failure with hypoxia (HCC)   COPD (chronic obstructive pulmonary disease) ([place])  COVID-19   Acute on chronic respiratory failure with hypoxia secondary to COVID-19 multifocal pneumonia Patient tested positive for Covid on 02/07/2019.  She initially required up to 20 L of high flow nasal cannula oxygen but after dialysis was weaned down to 10 L but then up to 15 L/min again of high flow oxygen.  PT evaluation ordered recommending SNF but her son and HPOA not wanting this. She has been to [facility] before and her son is open for her placement there (She has been at [facility] before). Will place CM consult for LTACH placement.    -continue with incentive spirometry and flutter valve.  LDH is within normal limits CRP is elevated, pro calcitonin less than 0.5, WBC count within normal limits, MRSA PCR is negative. Chest x-ray on admission showed asymmetric pulmonary opacity compatible with COVID-19 pneumonia  Mild progression in both lower lungs since 02/07/2019. Continue IV Decadron 6 mg daily CompletedIV remdesivir She will need to quarantine till 02/27/19. She remains afebrile and wbc count within normal limits.    Hypothyroidism Continue with Synthroid 112 MCG daily. TSH abnormal and high at 14.  As per the son patient missed her Synthroid for more than a week while waiting for her prescription to be filled out.  Recommend outpatient follow-up with PCP for repeat thyroid panel in about 3 weeks.   End-stage renal disease on dialysis Tuesday Thursday and Saturday. Volume management as per dialysis Nephrology following, HD per their recommendations.   Insulin-dependent diabetes mellitus with polyneuropathy and chronic kidney disease CBG (last 3)  Recent Labs  02/16/19 0813 02/16/19 1117 02/16/19 1810  GLUCAP 85 79 116*   Discontinued the lantus as her CBG is in 60's this am.  Continue with SSI. Last A1c is 5.6.    Body mass index is 38.07 kg/m. Morbidly obese DVT prophylaxis heparin  7500 TID.     History of COPD, advanced No wheezing heard Continue with  bronchodilators as needed.   Pressure injury on admission.   Pressure Injury 02/13/19 Heel Left Deep Tissue Pressure Injury - Purple or maroon localized area of discolored intact skin or blood-filled blister due to damage of underlying soft tissue from pressure and/or shear. (Active)  02/13/19 1000  Location: Heel  Location Orientation: Left  Staging: Deep Tissue Pressure Injury - Purple or maroon localized area of discolored intact skin or blood-filled blister due to damage of underlying soft tissue from pressure and/or shear.  Wound Description (Comments):   Present on Admission:    Wound care consulted.       DVT prophylaxis: Heparin Code Status: Full code Family Communication: None at bedside, updated her son over the phone in detail Disposition Plan: pending improvement in hypoxia, will likely need LTACH placement.  Consultants:   Nephrology  Palliative care consulted  Wound care.   Procedures: None Antimicrobials: None  Subjective: She feels tired. Continues to require 15L/min HFNC.  Objective: Vitals:   02/16/19 1730 02/16/19 1800 02/16/19 1842 02/16/19 1849  BP: (!) 100/42 112/65 105/67   Pulse: 86 77    Resp:  19 (!) 23   Temp:  98.1 F (36.7 C)    TempSrc:  Oral    SpO2:  97%  97%  Weight:  91.4 kg      Intake/Output Summary (Last 24 hours) at 02/16/2019 1901 Last data filed at 02/16/2019 1842 Gross per 24 hour  Intake 360 ml  Output 2000 ml  Net -1640 ml   Filed Weights   02/14/19 1100 02/16/19 1430 02/16/19 1800  Weight: 96 kg 93.4 kg 91.4 kg    Examination:  General exam: Resting in bed, in NAD   Respiratory system: no respiratory distress, shallow breaths, no wheezing.  Cardiovascular system: S1S2 present, RRR. Marland Kitchen  Gastrointestinal system: Abdomen is soft, non tender non distended Central nervous system: arousable,  oriented, able to answer all questions appropriately, follows commands. No facial droop.   Extremities: no cyanosis or  clubbing.  Skin: left heel deep injury.  Psychiatry: mood is appropriate.     Data Reviewed: I have personally reviewed following labs and imaging studies  CBC: Recent Labs  Lab 02/10/19 0235 02/10/19 0235 02/10/19 1712 02/11/19 0730 02/12/19 0349 02/13/19 0157 02/14/19 0221  WBC 7.1  --   --  4.5 4.6 5.5 7.3  NEUTROABS 6.3  --   --  3.0 3.1 3.1 4.7  HGB 9.8*   < > 9.9* 9.6* 9.2* 9.3* 9.4*  HCT 32.1*   < > 29.0* 31.6* 30.6* 29.9* 30.2*  MCV 97.0  --   --  96.3 96.8 94.6 93.5  PLT 132*  --   --  132* 162 181 189   < > = values in this interval not displayed.   Basic Metabolic Panel: Recent Labs  Lab 02/12/19 0349 02/13/19 0157 02/14/19 0221 02/15/19 0625 02/16/19 0541  NA 136 136 135 140 138  K 4.2 4.2 4.5 3.9 5.6*  CL 96* 96* 96* 101 101  CO2 26 24 23 24 22   GLUCOSE 97 77 88 77 76  BUN 22 38* 51* 27*  44*  CREATININE 4.11* 5.91* 7.29* 4.69* 6.48*  CALCIUM 8.4* 8.3* 8.4* 8.8* 8.9  PHOS  --   --   --  4.8*  --    GFR: Estimated Creatinine Clearance: 7.7 mL/min (A) (by C-G formula based on SCr of 6.48 mg/dL (H)). Liver Function Tests: Recent Labs  Lab 02/09/19 1946 02/10/19 0235 02/11/19 0730 02/12/19 0349 02/13/19 0157  AST 24 18 22 22 20   ALT 13 13 12 15 12   ALKPHOS 86 77 63 67 63  BILITOT 1.3* 1.4* 1.0 0.8 0.9  PROT 5.5* 5.3* 5.2* 5.1* 4.7*  ALBUMIN 2.7* 2.6* 2.5* 2.3* 2.3*   No results for input(s): LIPASE, AMYLASE in the last 168 hours. No results for input(s): AMMONIA in the last 168 hours. Coagulation Profile: No results for input(s): INR, PROTIME in the last 168 hours. Cardiac Enzymes: No results for input(s): CKTOTAL, CKMB, CKMBINDEX, TROPONINI in the last 168 hours. BNP (last 3 results) No results for input(s): PROBNP in the last 8760 hours. HbA1C: Recent Labs    02/14/19 1830  HGBA1C 6.0*   CBG: Recent Labs  Lab 02/15/19 1725 02/15/19 2232 02/16/19 0813 02/16/19 1117 02/16/19 1810  GLUCAP 162* 137* 85 79 116*   Lipid  Profile: No results for input(s): CHOL, HDL, LDLCALC, TRIG, CHOLHDL, LDLDIRECT in the last 72 hours. Thyroid Function Tests: No results for input(s): TSH, T4TOTAL, FREET4, T3FREE, THYROIDAB in the last 72 hours. Anemia Panel: No results for input(s): VITAMINB12, FOLATE, FERRITIN, TIBC, IRON, RETICCTPCT in the last 72 hours. Sepsis Labs: Recent Labs  Lab 02/10/19 0235 02/11/19 0730 02/12/19 0349 02/13/19 0157  PROCALCITON 0.62 0.58 0.57 0.49    Recent Results (from the past 240 hour(s))  SARS CORONAVIRUS 2 (TAT 6-24 HRS) Nasopharyngeal Nasopharyngeal Swab     Status: Abnormal   Collection Time: 02/07/19  4:13 PM   Specimen: Nasopharyngeal Swab  Result Value Ref Range Status   SARS Coronavirus 2 POSITIVE (A) NEGATIVE Final    Comment: RESULT CALLED TO, READ BACK BY AND VERIFIED WITH: RN KIM SPRINGERS AT 2255 BY [place] ON 02/07/2019 (NOTE) SARS-CoV-2 target nucleic acids are DETECTED. The SARS-CoV-2 RNA is generally detectable in upper and lower respiratory specimens during the acute phase of infection. Positive results are indicative of the presence of SARS-CoV-2 RNA. Clinical correlation with patient history and other diagnostic information is  necessary to determine patient infection status. Positive results do not rule out bacterial infection or co-infection with other viruses.  The expected result is Negative. Fact Sheet for Patients: SugarRoll.be Fact Sheet for Healthcare Providers: https://www.woods-mathews.com/ This test is not yet approved or cleared by the [place] FDA and  has been authorized for detection and/or diagnosis of SARS-CoV-2 by FDA under an Emergency Use Authorization (EUA). This EUA will remain  in effect (meaning this te st can be used) for the duration of the COVID-19 declaration under Section 564(b)(1) of the Act, 21 U.S.C. section 360bbb-3(b)(1), unless the authorization is terminated or revoked  sooner. Performed at [facility], [place] [address] [place], [place] [address]   MRSA PCR Screening     Status: None   Collection Time: 02/11/19  1:21 AM   Specimen: Nasal Mucosa; Nasopharyngeal  Result Value Ref Range Status   MRSA by PCR NEGATIVE NEGATIVE Final    Comment:        The GeneXpert MRSA Assay (FDA approved for NASAL specimens only), is one component of a comprehensive MRSA colonization surveillance program. It is not intended  to diagnose MRSA infection nor to guide or monitor treatment for MRSA infections. Performed at [facility], [place] [address], [place], [place] [address]          Radiology Studies: No results found.      Scheduled Meds: . vitamin C  500 mg Oral Daily  . calcitRIOL  1.75 mcg Oral Q T,Th,Sa-HD  . Chlorhexidine Gluconate Cloth  6 each Topical Q0600  . clonazePAM  0.25 mg Oral Daily  . clonazepam  0.25 mg Oral QHS  . dexamethasone (DECADRON) injection  6 mg Intravenous Daily  . docusate sodium  100 mg Oral q morning - 10a  . feeding supplement (NEPRO CARB STEADY)  237 mL Oral BID BM  . FLUoxetine  10 mg Oral Daily  . heparin  7,500 Units Subcutaneous Q8H  . insulin aspart  0-5 Units Subcutaneous QHS  . insulin aspart  0-6 Units Subcutaneous TID WC  . Ipratropium-Albuterol  2 puff Inhalation TID  . levothyroxine  112 mcg Oral QAC breakfast  . midodrine  10 mg Oral BID  . montelukast  10 mg Oral QHS  . multivitamin  1 tablet Oral QHS  . mupirocin ointment   Topical Daily  . rosuvastatin  40 mg Oral QHS  . senna  1 tablet Oral q morning - 10a  . sevelamer carbonate  1,600 mg Oral TID WC  . sodium chloride flush  3 mL Intravenous Q12H  . zinc sulfate  220 mg Oral Daily   Continuous Infusions: . sodium chloride Stopped (02/14/19 1322)     LOS: 7 days     Time spent: 35 minutes   Blain Pais, MD [facility]    02/16/2019, 7:01 PM

## 2019-02-16 NOTE — Progress Notes (Signed)
[facility] Progress Note   Home meds: - furosemide 80 qd/ midodrine 10 TTS pre hd - sevelamer carb ac tid - clonazepam 0.25 am and 0.50 pm/ fluoxetine 10 qd - rosuvastatin 40 hs - montelukast 10 hs/ levalbuterol tid prn/ home O2 3L - levothyroxine 112 ug - insulin detemir 15 qd/ lispro SSI tid   Outpt HD:TTS G-O  4h 400/800 94kg 2/2 bath TDC R - sensipar 60 po - venofer 50 /wk - mircera 75 ug q 2wk, last 1/14 - calc 1.75 ug tiw  Assessment/ Plan:   1. COVID + PNA - tested + on 1/12, will need to quarantine until 02/27/19. Getting IV steroids / remdesivir and IV vanc / aztreonam for poss CAP 2. ESRD - on HD TTS at COVID shift.  1/19 HD net UF 2.3  On for HD today. Overall looks very weak.  3. Hypotension/ volume - chronic hypotension on midodrine, is up 4-5 kg by wts, unable to pull much w/ hypotension.Midodriine had to be changed from pre HDto 10mg  bid daily.  4. DM 2 - on insulin 5. Anemia ckd - had esa yest 6. Renal osteodystrophy -ordered aphos for 1/20 -> 4.8 (within goal) 7. COPD /chron resp failure - on home O2 5L 8. Pall Care - pt is enrolled in a community-based Palliative Care program at home. Consider PC consult for [place].  Subjective:   Appears mildly dyspneic although she denies; on 15L O2 Denies f/myalgias/nausea   Objective:   BP (!) 142/71 (BP Location: Left Arm)   Pulse 72   Temp 98.9 F (37.2 C) (Oral)   Resp 18   Wt 96 kg   SpO2 93%   BMI 39.99 kg/m   Intake/Output Summary (Last 24 hours) at 02/16/2019 0814 Last data filed at 02/15/2019 1600 Gross per 24 hour  Intake 420 ml  Output --  Net 420 ml   Weight change:   Physical Exam: Gen pleasant No jvd or bruits Chest clear bilatbilat, distant BS RRR no MRG Abd soft ntnd no mass or ascites +bsobese Extchronic 1+ distal pretib bilatedema Neuro isawake but hard of hearing,nonfocal Access RIJ TC  Imaging: No results  found.  Labs: BMET Recent Labs  Lab 02/10/19 0235 02/10/19 0235 02/10/19 1712 02/11/19 0730 02/12/19 0349 02/13/19 0157 02/14/19 0221 02/15/19 0625 02/16/19 0541  NA 135   < > 132* 137 136 136 135 140 138  K 4.4   < > 5.0 4.6 4.2 4.2 4.5 3.9 5.6*  CL 94*  --   --  94* 96* 96* 96* 101 101  CO2 25  --   --  22 26 24 23 24 22   GLUCOSE 119*  --   --  83 97 77 88 77 76  BUN 18  --   --  37* 22 38* 51* 27* 44*  CREATININE 4.36*  --   --  6.01* 4.11* 5.91* 7.29* 4.69* 6.48*  CALCIUM 8.3*  --   --  8.3* 8.4* 8.3* 8.4* 8.8* 8.9  PHOS  --   --   --   --   --   --   --  4.8*  --    < > = values in this interval not displayed.   CBC Recent Labs  Lab 02/11/19 0730 02/12/19 0349 02/13/19 0157 02/14/19 0221  WBC 4.5 4.6 5.5 7.3  NEUTROABS 3.0 3.1 3.1 4.7  HGB 9.6* 9.2* 9.3* 9.4*  HCT 31.6* 30.6* 29.9* 30.2*  MCV 96.3 96.8 94.6 93.5  PLT 132* 162 181 189    Medications:    . vitamin C  500 mg Oral Daily  . calcitRIOL  1.75 mcg Oral Q T,Th,Sa-HD  . Chlorhexidine Gluconate Cloth  6 each Topical Q0600  . clonazePAM  0.25 mg Oral Daily  . clonazepam  0.25 mg Oral QHS  . dexamethasone (DECADRON) injection  6 mg Intravenous Daily  . docusate sodium  100 mg Oral q morning - 10a  . feeding supplement (NEPRO CARB STEADY)  237 mL Oral BID BM  . FLUoxetine  10 mg Oral Daily  . heparin  7,500 Units Subcutaneous Q8H  . insulin aspart  0-5 Units Subcutaneous QHS  . insulin aspart  0-6 Units Subcutaneous TID WC  . Ipratropium-Albuterol  2 puff Inhalation TID  . levothyroxine  112 mcg Oral QAC breakfast  . midodrine  10 mg Oral BID  . montelukast  10 mg Oral QHS  . multivitamin  1 tablet Oral QHS  . mupirocin ointment   Topical Daily  . rosuvastatin  40 mg Oral QHS  . senna  1 tablet Oral q morning - 10a  . sevelamer carbonate  1,600 mg Oral TID WC  . sodium chloride flush  3 mL Intravenous Q12H  . zinc sulfate  220 mg Oral Daily      Otelia Santee, MD 02/16/2019, 8:14 AM

## 2019-02-16 NOTE — TOC Progression Note (Signed)
Transition of Care [facility]) - Progression Note    Patient Details  Name: Joan Mosley MRN: [id number] Date of Birth: 1945-07-28  Transition of Care [facility]) CM/SW [place], [place] Work Phone Number: 02/16/2019, 10:22 AM  Clinical Narrative:     CSW spoke with pt's son [name]. [name] seems to be very engaged in his mothers care, even though he resides in [place]. [name] stated that he was pt's healthcare POA and asked many questions regarding her condition. CSW stated that she was weak and was continuing her dialysis here at the hospital. [name] stated that a SNF has not done much for his mother in the past and considered home health over a SNF. CSW will continue to follow up as needed. CSW gave [place] station number to ask questions regarding his mothers condition as needed.    Expected Discharge Plan: [place] Barriers to Discharge: Continued Medical Work up  Expected Discharge Plan and Services Expected Discharge Plan: [place] In-house Referral: Clinical Social Work Discharge Planning Services: CM Consult Post Acute Care Choice: [place] arrangements for the past 2 months: Single Family Home                   DME Agency: [place]                   Social Determinants of Health (SDOH) Interventions    Readmission Risk Interventions Readmission Risk Prevention Plan 10/04/2018  Transportation Screening Complete  Medication Review Press photographer) Complete  PCP or Specialist appointment within 3-5 days of discharge Complete  HRI or [place] Complete  SW Recovery Care/Counseling Consult Complete  Palliative Care Screening Complete  [place] Patient Refused

## 2019-02-17 ENCOUNTER — Encounter (HOSPITAL_COMMUNITY): Payer: Medicare Other

## 2019-02-17 ENCOUNTER — Other Ambulatory Visit: Payer: Self-pay

## 2019-02-17 ENCOUNTER — Inpatient Hospital Stay (HOSPITAL_COMMUNITY): Payer: Medicare Other

## 2019-02-17 LAB — RENAL FUNCTION PANEL
Albumin: 3 g/dL — ABNORMAL LOW (ref 3.5–5.0)
Anion gap: 15 (ref 5–15)
BUN: 23 mg/dL (ref 8–23)
CO2: 26 mmol/L (ref 22–32)
Calcium: 8.8 mg/dL — ABNORMAL LOW (ref 8.9–10.3)
Chloride: 93 mmol/L — ABNORMAL LOW (ref 98–111)
Creatinine, Ser: 4.06 mg/dL — ABNORMAL HIGH (ref 0.44–1.00)
GFR calc Af Amer: 12 mL/min — ABNORMAL LOW (ref >60)
GFR calc non Af Amer: 10 mL/min — ABNORMAL LOW (ref >60)
Glucose, Bld: 82 mg/dL (ref 70–99)
Phosphorus: 4.8 mg/dL — ABNORMAL HIGH (ref 2.5–4.6)
Potassium: 3.6 mmol/L (ref 3.5–5.1)
Sodium: 134 mmol/L — ABNORMAL LOW (ref 135–145)

## 2019-02-17 LAB — GLUCOSE, CAPILLARY
Glucose-Capillary: 73 mg/dL (ref 70–99)
Glucose-Capillary: 90 mg/dL (ref 70–99)
Glucose-Capillary: 160 mg/dL — ABNORMAL HIGH (ref 70–99)
Glucose-Capillary: 134 mg/dL — ABNORMAL HIGH (ref 70–99)

## 2019-02-17 MED ORDER — GUAIFENESIN ER 600 MG PO TB12
600.0000 mg | ORAL_TABLET | Freq: Two times a day (BID) | ORAL | Status: DC
  Administered 2019-02-17 – 2019-02-22 (×10): 600 mg via ORAL
  Filled 2019-02-17 (×11): qty 1

## 2019-02-17 NOTE — Progress Notes (Signed)
PROGRESS NOTE    Joan Mosley  [id number] DOB: 1945-04-13 DOA: 02/09/2019 PCP: Flossie Buffy, NP    Brief Narrative: 74 year old lady with prior history of end-stage renal disease on dialysis, COPD,, chronic respiratory failure on 5 L of home oxygen, type 2 diabetes mellitus, hypothyroidism, TIA presented to ED from dialysis for evaluation of hypoxia.  Patient was tested positive for COVID-19 on 02/07/2019 and 20 minutes into her dialysis she was noted to be hypoxic and was brought to ED for further evaluation.  On arrival to ED patient was saturating 88% on nonrebreather and she was switched to 20 L of high flow oxygen and sats have been around 92%.  Chest x-ray showed asymmetric pulmonary opacities compatible with COVID-19 pneumonia mildly progressed in the lower lungs compared to prior chest x-ray from 2 days ago. She is now admitted for further evaluation and management on acute on chronic respiratory failure with hypoxia secondary to COVID-19 multifocal pneumonia. Nephrology consulted for dialysis. She underwent HD and her breathing has improved. We were able to wean her down to 9 lit of HF oxygen and her sats have remained well above 95%.  Meanwhile palliative care consulted for [place], plan for outpatient palliative care follow up .  Of note patient had missed one HD session and missed more than a week of her synthroid tablets while waiting for her scripts to be filled. Recommend rechecking thyroid panel in 3 to 4 weeks post discharge and adjusting medications as needed.  Pt seen and examined, still on 10-15 lit of HF oxygen but stable.   Assessment & Plan:   Principal Problem:   Pneumonia due to COVID-19 virus Active Problems:   Type 2 diabetes mellitus with diabetic polyneuropathy, with long-term current use of insulin (HCC)   ESRD (end stage renal disease) (HCC)   Acute on chronic respiratory failure with hypoxia (HCC)   COPD (chronic obstructive pulmonary disease) ([place])  COVID-19   Acute on chronic respiratory failure with hypoxia secondary to COVID-19 multifocal pneumonia Patient tested positive for Covid on 02/07/2019.  She initially required up to 20 L of high flow nasal cannula oxygen but after dialysis was weaned down to 10 L but then up to 15 L/min again of high flow oxygen.  PT evaluation ordered recommending SNF but her son and HPOA not wanting this. She has been to [facility] before and her son is open for her placement there (she has been at [facility] before). CM consult for LTACH placement.   -continue with incentive spirometry and flutter valve. -Repeat CXR reviewed with Radiologist, indicating atelectasis, small effusion not amenable for thoracentesis.  -Start chest physiotherapy as tolerated.  -Wean off O2 supplementation as tolerated, down to 10L/min hfnc again today  LDH is within normal limits CRP is elevated, pro calcitonin less than 0.5, WBC count within normal limits, MRSA PCR is negative. Chest x-ray on admission showed asymmetric pulmonary opacity compatible with COVID-19 pneumonia  Mild progression in both lower lungs since 02/07/2019. Continue IV Decadron 6 mg daily CompletedIV remdesivir She will need to quarantine till 02/27/19. She remains afebrile and wbc count within normal limits.    Hypothyroidism Continue with Synthroid 112 MCG daily. TSH abnormal and high at 14.  As per the son patient missed her Synthroid for more than a week while waiting for her prescription to be filled out.  Recommend outpatient follow-up with PCP for repeat thyroid panel in about 3 weeks.   End-stage renal disease on dialysis Tuesday Thursday and Saturday. Volume  management as per dialysis Nephrology following, HD per their recommendations.   Insulin-dependent diabetes mellitus with polyneuropathy and chronic kidney disease CBG (last 3)  Recent Labs    02/17/19 0741 02/17/19 1206 02/17/19 1543  GLUCAP 73 90 160*   Discontinued lantus as her CBG were in  the 60's in the am.  Continue with SSI. Last A1c is 5.6.    Body mass index is 37.86 kg/m. Morbidly obese DVT prophylaxis heparin  7500 TID.     History of COPD, advanced No wheezing heard Continue with bronchodilators as needed. Started Mucinex   Pressure injury on admission.   Pressure Injury 02/13/19 Heel Left Deep Tissue Pressure Injury - Purple or maroon localized area of discolored intact skin or blood-filled blister due to damage of underlying soft tissue from pressure and/or shear. (Active)  02/13/19 1000  Location: Heel  Location Orientation: Left  Staging: Deep Tissue Pressure Injury - Purple or maroon localized area of discolored intact skin or blood-filled blister due to damage of underlying soft tissue from pressure and/or shear.  Wound Description (Comments):   Present on Admission:    Wound care consulted.       DVT prophylaxis: Heparin Code Status: Full code Family Communication: None at bedside, updated her son over the phone in detail on 1/21 Disposition Plan: pending improvement in hypoxia, will likely need LTACH placement.  Consultants:   Nephrology  Palliative care consulted  Wound care.   Procedures: None Antimicrobials: None  Subjective: She feels tired. Her O2 sats dropped this am but improved in the afternoon to 91% on 11/Lmin HFNC . She is deconditioned but motivated to work with RT, using IS and flutter valve. She had right knee pain but this improved in the afternoon.   Objective: Vitals:   02/17/19 0800 02/17/19 0916 02/17/19 1252 02/17/19 1428  BP: 109/63  123/74   Pulse:  85    Resp: 20 18 18    Temp:   97.6 F (36.4 C)   TempSrc:   Oral   SpO2: 90% (!) 89% 91% 91%  Weight:      Height:  5\' 1"  (1.549 m)      Intake/Output Summary (Last 24 hours) at 02/17/2019 1601 Last data filed at 02/17/2019 1300 Gross per 24 hour  Intake 195 ml  Output 2000 ml  Net -1805 ml   Filed Weights   02/16/19 1430 02/16/19 1800 02/17/19  0432  Weight: 93.4 kg 91.4 kg 90.9 kg    Examination:  General exam: Resting in bed, in NAD   Respiratory system: no respiratory distress, shallow breaths, no wheezing.  Cardiovascular system: S1S2 present, RRR. Marland Kitchen  Gastrointestinal system: Abdomen is soft, non tender non distended Central nervous system: arousable,  oriented, able to answer all questions appropriately, follows commands. No facial droop.   Extremities: no cyanosis or clubbing.  Skin: left heel deep injury.  Psychiatry: mood is appropriate.     Data Reviewed: I have personally reviewed following labs and imaging studies  CBC: Recent Labs  Lab 02/10/19 1712 02/11/19 0730 02/12/19 0349 02/13/19 0157 02/14/19 0221  WBC  --  4.5 4.6 5.5 7.3  NEUTROABS  --  3.0 3.1 3.1 4.7  HGB 9.9* 9.6* 9.2* 9.3* 9.4*  HCT 29.0* 31.6* 30.6* 29.9* 30.2*  MCV  --  96.3 96.8 94.6 93.5  PLT  --  132* 162 181 [id number]   Basic Metabolic Panel: Recent Labs  Lab 02/13/19 0157 02/14/19 0221 02/15/19 0625 02/16/19 0541 02/17/19 0504  NA  136 135 140 138 134*  K 4.2 4.5 3.9 5.6* 3.6  CL 96* 96* 101 101 93*  CO2 24 23 24 22 26   GLUCOSE 77 88 77 76 82  BUN 38* 51* 27* 44* 23  CREATININE 5.91* 7.29* 4.69* 6.48* 4.06*  CALCIUM 8.3* 8.4* 8.8* 8.9 8.8*  PHOS  --   --  4.8*  --  4.8*   GFR: Estimated Creatinine Clearance: 12.3 mL/min (A) (by C-G formula based on SCr of 4.06 mg/dL (H)). Liver Function Tests: Recent Labs  Lab 02/11/19 0730 02/12/19 0349 02/13/19 0157 02/17/19 0504  AST 22 22 20   --   ALT 12 15 12   --   ALKPHOS 63 67 63  --   BILITOT 1.0 0.8 0.9  --   PROT 5.2* 5.1* 4.7*  --   ALBUMIN 2.5* 2.3* 2.3* 3.0*   No results for input(s): LIPASE, AMYLASE in the last 168 hours. No results for input(s): AMMONIA in the last 168 hours. Coagulation Profile: No results for input(s): INR, PROTIME in the last 168 hours. Cardiac Enzymes: No results for input(s): CKTOTAL, CKMB, CKMBINDEX, TROPONINI in the last 168 hours. BNP  (last 3 results) No results for input(s): PROBNP in the last 8760 hours. HbA1C: Recent Labs    02/14/19 1830  HGBA1C 6.0*   CBG: Recent Labs  Lab 02/16/19 1810 02/16/19 2315 02/17/19 0741 02/17/19 1206 02/17/19 1543  GLUCAP 116* 105* 73 90 160*   Lipid Profile: No results for input(s): CHOL, HDL, LDLCALC, TRIG, CHOLHDL, LDLDIRECT in the last 72 hours. Thyroid Function Tests: No results for input(s): TSH, T4TOTAL, FREET4, T3FREE, THYROIDAB in the last 72 hours. Anemia Panel: No results for input(s): VITAMINB12, FOLATE, FERRITIN, TIBC, IRON, RETICCTPCT in the last 72 hours. Sepsis Labs: Recent Labs  Lab 02/11/19 0730 02/12/19 0349 02/13/19 0157  PROCALCITON 0.58 0.57 0.49    Recent Results (from the past 240 hour(s))  SARS CORONAVIRUS 2 (TAT 6-24 HRS) Nasopharyngeal Nasopharyngeal Swab     Status: Abnormal   Collection Time: 02/07/19  4:13 PM   Specimen: Nasopharyngeal Swab  Result Value Ref Range Status   SARS Coronavirus 2 POSITIVE (A) NEGATIVE Final    Comment: RESULT CALLED TO, READ BACK BY AND VERIFIED WITH: RN KIM SPRINGERS AT 2255 BY [place] ON 02/07/2019 (NOTE) SARS-CoV-2 target nucleic acids are DETECTED. The SARS-CoV-2 RNA is generally detectable in upper and lower respiratory specimens during the acute phase of infection. Positive results are indicative of the presence of SARS-CoV-2 RNA. Clinical correlation with patient history and other diagnostic information is  necessary to determine patient infection status. Positive results do not rule out bacterial infection or co-infection with other viruses.  The expected result is Negative. Fact Sheet for Patients: SugarRoll.be Fact Sheet for Healthcare Providers: https://www.woods-mathews.com/ This test is not yet approved or cleared by the [place] FDA and  has been authorized for detection and/or diagnosis of SARS-CoV-2 by FDA under an Emergency Use  Authorization (EUA). This EUA will remain  in effect (meaning this te st can be used) for the duration of the COVID-19 declaration under Section 564(b)(1) of the Act, 21 U.S.C. section 360bbb-3(b)(1), unless the authorization is terminated or revoked sooner. Performed at [facility], [place] [address], [place],  [address]   MRSA PCR Screening     Status: None   Collection Time: 02/11/19  1:21 AM   Specimen: Nasal Mucosa; Nasopharyngeal  Result Value Ref Range Status   MRSA by PCR NEGATIVE NEGATIVE  Final    Comment:        The GeneXpert MRSA Assay (FDA approved for NASAL specimens only), is one component of a comprehensive MRSA colonization surveillance program. It is not intended to diagnose MRSA infection nor to guide or monitor treatment for MRSA infections. Performed at [facility], [place] [address], [place], [place] [address]          Radiology Studies: DG CHEST PORT 1 VIEW  Result Date: 02/17/2019 CLINICAL DATA:  COVID-19 positive patient.  Dyspnea. EXAM: PORTABLE CHEST 1 VIEW COMPARISON:  Single-view of the chest 02/12/2019 and 02/09/2019. FINDINGS: Moderate right pleural effusion and basilar airspace disease have worsened. The left lung remains clear. Cardiac silhouette is largely obscured. No pneumothorax. Dialysis catheter noted. IMPRESSION: Moderate right pleural effusion and basilar airspace disease have worsened. No other change. Electronically Signed   By: Inge Rise M.D.   On: 02/17/2019 12:55        Scheduled Meds: . vitamin C  500 mg Oral Daily  . calcitRIOL  1.75 mcg Oral Q T,Th,Sa-HD  . Chlorhexidine Gluconate Cloth  6 each Topical Q0600  . clonazePAM  0.25 mg Oral Daily  . clonazepam  0.25 mg Oral QHS  . dexamethasone (DECADRON) injection  6 mg Intravenous Daily  . docusate sodium  100 mg Oral q morning - 10a  . feeding supplement (NEPRO CARB STEADY)  237 mL Oral BID BM  . FLUoxetine  10 mg Oral Daily  . guaiFENesin  600 mg  Oral BID  . heparin  7,500 Units Subcutaneous Q8H  . insulin aspart  0-5 Units Subcutaneous QHS  . insulin aspart  0-6 Units Subcutaneous TID WC  . Ipratropium-Albuterol  2 puff Inhalation TID  . levothyroxine  112 mcg Oral QAC breakfast  . midodrine  10 mg Oral BID  . montelukast  10 mg Oral QHS  . multivitamin  1 tablet Oral QHS  . mupirocin ointment   Topical Daily  . rosuvastatin  40 mg Oral QHS  . senna  1 tablet Oral q morning - 10a  . sevelamer carbonate  1,600 mg Oral TID WC  . sodium chloride flush  3 mL Intravenous Q12H  . zinc sulfate  220 mg Oral Daily   Continuous Infusions: . sodium chloride Stopped (02/14/19 1322)     LOS: 8 days     Time spent: 35 minutes   Blain Pais, MD [facility]    02/17/2019, 4:01 PM

## 2019-02-17 NOTE — Progress Notes (Signed)
[facility] Progress Note   Home meds: - furosemide 80 qd/ midodrine 10 TTS pre hd - sevelamer carb ac tid - clonazepam 0.25 am and 0.50 pm/ fluoxetine 10 qd - rosuvastatin 40 hs - montelukast 10 hs/ levalbuterol tid prn/ home O2 3L - levothyroxine 112 ug - insulin detemir 15 qd/ lispro SSI tid   Outpt HD:TTS G-O  4h 400/800 94kg 2/2 bath TDC R  - sensipar 60 po - venofer 50 /wk - mircera 75 ug q 2wk, last 1/14 - calc 1.75 ug tiw  Assessment/ Plan:   1. COVID + PNA - tested + on 1/12, will need to quarantine until 02/27/19. Getting IV steroids / remdesivir and IV vanc / aztreonam (stopped 1/17) for poss CAP 2. ESRD - on HD TTS at COVID shift.  1/19 HD net UF 2.3, 1/21 2L  Next HD tomorrow.  3. Hypotension/ volume - chronic hypotension on midodrine, is 3.1 kg below EDS.  Midodriine had to be changed from pre HDto 10mg  bid daily.  4. DM 2 - on insulin 5. Anemia ckd - had esa 1/14 (next due 1/28) 6. Renal osteodystrophy -ordered aphos for 1/20-> 4.8 (within goal) 7. COPD /chron resp failure - on home O2 5L 8. Pall Care - pt is enrolled in a community-based Palliative Care program at home. Consider PC consult for [place].  Subjective:   Appears mildly dyspneic although she denies; on 11L O2 Denies f/myalgias/nausea   Objective:   BP 122/72 (BP Location: Left Arm)   Pulse 73   Temp 97.9 F (36.6 C) (Oral)   Resp 16   Wt 90.9 kg   SpO2 (!) 89%   BMI 37.86 kg/m   Intake/Output Summary (Last 24 hours) at 02/17/2019 0724 Last data filed at 02/16/2019 1842 Gross per 24 hour  Intake 360 ml  Output 2000 ml  Net -1640 ml   Weight change:   Physical Exam: Gen pleasant, mildly tachypneic but no incr in resp muscles No jvd or bruits Chest clear bilatbilat, distant BS RRR no MRG Abd soft ntnd no mass or ascites +bsobese Extchronic 1+ distal pretib bilatedema Neuro isawake but hard of hearing,nonfocal Access RIJ  TC  Imaging: No results found.  Labs: BMET Recent Labs  Lab 02/11/19 0730 02/12/19 0349 02/13/19 0157 02/14/19 0221 02/15/19 0625 02/16/19 0541 02/17/19 0504  NA 137 136 136 135 140 138 134*  K 4.6 4.2 4.2 4.5 3.9 5.6* 3.6  CL 94* 96* 96* 96* 101 101 93*  CO2 22 26 24 23 24 22 26   GLUCOSE 83 97 77 88 77 76 82  BUN 37* 22 38* 51* 27* 44* 23  CREATININE 6.01* 4.11* 5.91* 7.29* 4.69* 6.48* 4.06*  CALCIUM 8.3* 8.4* 8.3* 8.4* 8.8* 8.9 8.8*  PHOS  --   --   --   --  4.8*  --  4.8*   CBC Recent Labs  Lab 02/11/19 0730 02/12/19 0349 02/13/19 0157 02/14/19 0221  WBC 4.5 4.6 5.5 7.3  NEUTROABS 3.0 3.1 3.1 4.7  HGB 9.6* 9.2* 9.3* 9.4*  HCT 31.6* 30.6* 29.9* 30.2*  MCV 96.3 96.8 94.6 93.5  PLT 132* 162 181 189    Medications:    . vitamin C  500 mg Oral Daily  . calcitRIOL  1.75 mcg Oral Q T,Th,Sa-HD  . Chlorhexidine Gluconate Cloth  6 each Topical Q0600  . clonazePAM  0.25 mg Oral Daily  . clonazepam  0.25 mg Oral QHS  . dexamethasone (DECADRON) injection  6  mg Intravenous Daily  . docusate sodium  100 mg Oral q morning - 10a  . feeding supplement (NEPRO CARB STEADY)  237 mL Oral BID BM  . FLUoxetine  10 mg Oral Daily  . heparin  7,500 Units Subcutaneous Q8H  . insulin aspart  0-5 Units Subcutaneous QHS  . insulin aspart  0-6 Units Subcutaneous TID WC  . Ipratropium-Albuterol  2 puff Inhalation TID  . levothyroxine  112 mcg Oral QAC breakfast  . midodrine  10 mg Oral BID  . montelukast  10 mg Oral QHS  . multivitamin  1 tablet Oral QHS  . mupirocin ointment   Topical Daily  . rosuvastatin  40 mg Oral QHS  . senna  1 tablet Oral q morning - 10a  . sevelamer carbonate  1,600 mg Oral TID WC  . sodium chloride flush  3 mL Intravenous Q12H  . zinc sulfate  220 mg Oral Daily      Otelia Santee, MD 02/17/2019, 7:24 AM

## 2019-02-17 NOTE — Plan of Care (Signed)

## 2019-02-17 NOTE — Care Management (Addendum)
TOC acknowledges consult for LTACH.  CM gave referral to both [facility] and [facility] - agencies will follow back up with CM.  Update:  [facility] informed CM that facility does not have a bed at the current time, may have one next week - at that time they will re-revew for appropriateness  Update:  [facility] still reviewing for appropriateness  Pt was in [place] in July 2020 left AMA, pt was then admitted after that and therefore pt may not have sufficient LTACH days.

## 2019-02-17 NOTE — Progress Notes (Signed)
Notified MD about patient complaining of right calf pain, tender to palpation. Also, notified MD on patients higher needs for oxygen. RT called for possible non rebreather or high flow.

## 2019-02-17 NOTE — Progress Notes (Addendum)
Physical Therapy Treatment Patient Details Name: Joan Mosley MRN: [id number] DOB: 1945-03-25 Today's Date: 02/17/2019    History of Present Illness This 74 y.o. female admitted from HD 02/09/2019 center due to hypoxia.  Pt tested + for COVID on 02/07/2019.  Dx: COVID related PNA.  PMH includes: DM  , ESRD, COPD, Hypothyroidism    PT Comments    Pt reports feeling poorly today. Assisted RN with repositioning in bed. RN adjusting O2. Pt now requiring 15 L with SpO2 87% at rest. Attempted LE exercises in supine. Pt with c/o BLE pain, primarily R calf. R calf discolored with ropey feel and tender to palpation. RN in room and notified. MD ordered LE dopplers. SNF remains appropriate at d/c. If pt/family continue to decline, she will need hoyer lift for home.    Follow Up Recommendations  SNF     Equipment Recommendations  Other (comment)(hoyer lift if pt goes home)    Recommendations for Other Services       Precautions / Restrictions Precautions Precautions: Fall Precaution Comments: O2 dependent at baseline    Mobility  Bed Mobility Overal bed mobility: Needs Assistance             General bed mobility comments: assist with repositioning in bed, +2 max assist  Transfers                    Ambulation/Gait                 Stairs             Wheelchair Mobility    Modified Rankin (Stroke Patients Only)       Balance                                            Cognition Arousal/Alertness: Awake/alert Behavior During Therapy: WFL for tasks assessed/performed Overall Cognitive Status: No family/caregiver present to determine baseline cognitive functioning                                 General Comments: slow processing. May be due to [facility].      Exercises General Exercises - Lower Extremity Ankle Circles/Pumps: AAROM;AROM;Right;Left;5 reps Heel Slides: AAROM;Right;Left;5 reps;Supine    General Comments         Pertinent Vitals/Pain Pain Assessment: Faces Faces Pain Scale: Hurts even more Pain Location: BLE (R > L) Pain Descriptors / Indicators: Tender;Grimacing;Sore Pain Intervention(s): Limited activity within patient's tolerance;Monitored during session    Home Living                      Prior Function            PT Goals (current goals can now be found in the care plan section) Acute Rehab PT Goals Patient Stated Goal: feel better Progress towards PT goals: Not progressing toward goals - comment(increased O2 needs, BLE pain)    Frequency    Min 3X/week      PT Plan Current plan remains appropriate    Co-evaluation              AM-PAC PT "6 Clicks" Mobility   Outcome Measure  Help needed turning from your back to your side while in a flat bed without using bedrails?: A Lot Help needed moving  from lying on your back to sitting on the side of a flat bed without using bedrails?: A Lot Help needed moving to and from a bed to a chair (including a wheelchair)?: Total Help needed standing up from a chair using your arms (e.g., wheelchair or bedside chair)?: Total Help needed to walk in hospital room?: Total Help needed climbing 3-5 steps with a railing? : Total 6 Click Score: 8    End of Session Equipment Utilized During Treatment: Oxygen Activity Tolerance: Treatment limited secondary to medical complications (Comment);Patient limited by pain(increased O2 needs) Patient left: in bed;with call bell/phone within reach;with nursing/sitter in room Nurse Communication: Mobility status PT Visit Diagnosis: Muscle weakness (generalized) (M62.81);Other abnormalities of gait and mobility (R26.89)     Time: [id number] PT Time Calculation (min) (ACUTE ONLY): 16 min  Charges:  $Therapeutic Exercise: 8-22 mins                     Lorrin Goodell, PT  Office # [phone] Pager [phone]    [name] 02/17/2019, 1:32 PM

## 2019-02-18 ENCOUNTER — Inpatient Hospital Stay (HOSPITAL_COMMUNITY): Payer: Medicare Other

## 2019-02-18 DIAGNOSIS — J449 Chronic obstructive pulmonary disease, unspecified: Secondary | ICD-10-CM

## 2019-02-18 DIAGNOSIS — N186 End stage renal disease: Secondary | ICD-10-CM

## 2019-02-18 DIAGNOSIS — U071 COVID-19: Principal | ICD-10-CM

## 2019-02-18 DIAGNOSIS — T17908A Unspecified foreign body in respiratory tract, part unspecified causing other injury, initial encounter: Secondary | ICD-10-CM

## 2019-02-18 DIAGNOSIS — J1282 Pneumonia due to coronavirus disease 2019: Secondary | ICD-10-CM

## 2019-02-18 LAB — BLOOD GAS, ARTERIAL
Acid-Base Excess: 1.3 mmol/L (ref 0.0–2.0)
Bicarbonate: 25.7 mmol/L (ref 20.0–28.0)
Drawn by: 53547
FIO2: 100
O2 Saturation: 94.6 %
Patient temperature: 36.6
pCO2 arterial: 41.7 mmHg (ref 32.0–48.0)
pH, Arterial: 7.404 (ref 7.350–7.450)
pO2, Arterial: 75.2 mmHg — ABNORMAL LOW (ref 83.0–108.0)

## 2019-02-18 LAB — GLUCOSE, CAPILLARY
Glucose-Capillary: 71 mg/dL (ref 70–99)
Glucose-Capillary: 57 mg/dL — ABNORMAL LOW (ref 70–99)
Glucose-Capillary: 72 mg/dL (ref 70–99)
Glucose-Capillary: 182 mg/dL — ABNORMAL HIGH (ref 70–99)
Glucose-Capillary: 157 mg/dL — ABNORMAL HIGH (ref 70–99)

## 2019-02-18 MED ORDER — BUDESONIDE 0.25 MG/2ML IN SUSP: 0.2500 mg | Freq: Two times a day (BID) | RESPIRATORY_TRACT | Status: DC

## 2019-02-18 MED ORDER — SODIUM CHLORIDE 0.9 % IV SOLN
1.0000 g | Freq: Three times a day (TID) | INTRAVENOUS | Status: DC
  Administered 2019-02-19: 07:00:00 1 g via INTRAVENOUS
  Filled 2019-02-18 (×3): qty 1

## 2019-02-18 MED ORDER — ALBUMIN HUMAN 25 % IV SOLN
INTRAVENOUS | Status: AC
  Filled 2019-02-18: qty 100

## 2019-02-18 MED ORDER — ALBUMIN HUMAN 25 % IV SOLN
50.0000 g | INTRAVENOUS | Status: AC
  Administered 2019-02-18: 50 g via INTRAVENOUS

## 2019-02-18 MED ORDER — SALINE SPRAY 0.65 % NA SOLN
2.0000 | Freq: Three times a day (TID) | NASAL | Status: DC
  Administered 2019-02-19 – 2019-02-22 (×11): 2 via NASAL
  Filled 2019-02-18 (×2): qty 44

## 2019-02-18 MED ORDER — SODIUM CHLORIDE 0.9 % IV SOLN
2.0000 g | Freq: Once | INTRAVENOUS | Status: AC
  Administered 2019-02-19: 01:00:00 2 g via INTRAVENOUS
  Filled 2019-02-18: qty 2

## 2019-02-18 MED ORDER — HEPARIN SODIUM (PORCINE) 1000 UNIT/ML IJ SOLN
INTRAMUSCULAR | Status: AC
  Filled 2019-02-18: qty 4

## 2019-02-18 MED ORDER — METRONIDAZOLE IN NACL 5-0.79 MG/ML-% IV SOLN
500.0000 mg | Freq: Three times a day (TID) | INTRAVENOUS | Status: DC
Start: 2019-02-18 — End: 2019-02-23
  Administered 2019-02-18 – 2019-02-22 (×12): 500 mg via INTRAVENOUS
  Filled 2019-02-18 (×12): qty 100

## 2019-02-18 MED ORDER — IOHEXOL 350 MG/ML SOLN
100.0000 mL | Freq: Once | INTRAVENOUS | Status: AC | PRN
  Administered 2019-02-18: 100 mL via INTRAVENOUS

## 2019-02-18 NOTE — Progress Notes (Signed)
Pharmacy Antibiotic Note  Joan Mosley is a 74 y.o. female admitted on 02/09/2019 with pneumonia.  Pharmacy has been consulted for aztreonam dosing.  Plan: Aztreonam 2gm IV x 1 then 1gm IV q8 hours F/u cultures and clinical course  Height: 5\' 1"  (154.9 cm) Weight: 194 lb 0.1 oz (88 kg) IBW/kg (Calculated) : 47.8  Temp (24hrs), Avg:98.4 F (36.9 C), Min:98 F (36.7 C), Max:99.2 F (37.3 C)  Recent Labs  Lab 02/12/19 0349 02/12/19 0349 02/13/19 0157 02/14/19 0221 02/15/19 0625 02/16/19 0541 02/17/19 0504  WBC 4.6  --  5.5 7.3  --   --   --   CREATININE 4.11*   < > 5.91* 7.29* 4.69* 6.48* 4.06*   < > = values in this interval not displayed.    Estimated Creatinine Clearance: 12.1 mL/min (A) (by C-G formula based on SCr of 4.06 mg/dL (H)).    Allergies  Allergen Reactions  . Avelox [Moxifloxacin Hcl In Nacl] Other (See Comments)    Unknown reaction  . Codeine Anaphylaxis  . Penicillins Rash    Did it involve swelling of the face/tongue/throat, SOB, or low BP? Unk Did it involve sudden or severe rash/hives, skin peeling, or any reaction on the inside of your mouth or nose? Yes Did you need to seek medical attention at a hospital or doctor's office? Unk When did it last happen? Unk If all above answers are "NO", may proceed with cephalosporin use.   . Sulfa Antibiotics Other (See Comments)    Unknown reaction  . Dextromethorphan-Guaifenesin Other (See Comments)    Reported by [facility] - unknown reaction. Pt states she is not allergic to dextromethorphan-guaifenesin.  . Shellfish Allergy Hives, Itching and Rash     Thank you for allowing pharmacy to be a part of this patient's care.  Beverlee Nims 02/18/2019 9:50 PM

## 2019-02-18 NOTE — Progress Notes (Signed)
I called and updated her son [name], he confirmed that she is a Full code.  PCCM has been consulted and will evaluate the patient tonight.

## 2019-02-18 NOTE — Progress Notes (Signed)
Patient back from HD, blood sugar 57, gave juice/snacks recheck at 74. Protein drink given.

## 2019-02-18 NOTE — Plan of Care (Signed)
Patient is currently on 10L via high flow o2. Denies any pain or discomfort at the moment. Given two Tessalon pills for persistent cough per patient. Dressing change to right shin completed and mepilex foam to left heel changed. Patient tolerated well. Will continue to monitor.

## 2019-02-18 NOTE — Progress Notes (Signed)
Updated patients son on the phone.

## 2019-02-18 NOTE — Progress Notes (Signed)
[facility] Progress Note   Home meds: - furosemide 80 qd/ midodrine 10 TTS pre hd - sevelamer carb ac tid - clonazepam 0.25 am and 0.50 pm/ fluoxetine 10 qd - rosuvastatin 40 hs - montelukast 10 hs/ levalbuterol tid prn/ home O2 3L - levothyroxine 112 ug - insulin detemir 15 qd/ lispro SSI tid   Outpt HD:TTS G-O  4h 400/800 94kg 2/2 bath TDC R  - sensipar 60 po - venofer 50 /wk - mircera 75 ug q 2wk, last 1/14 - calc 1.75 ug tiw  Assessment/ Plan:   1. COVID + PNA - tested + on 1/12, will need to quarantine until 02/27/19. Getting IV steroids / remdesivir and IV vanc / aztreonam (stopped 1/17) for poss CAP 2. ESRD - on HD TTS at COVID shift. Will need new EDW when d/c  1/19 HD net UF 2.3, 1/21 2L  On for HD today  3. Hypotension/ volume - chronic hypotension on midodrine,  4-6 kg below EDW in hospital.  Midodriine had to be changedfrom pre HDto 10mg  bid daily.  4. DM 2 - on insulin 5. Anemia ckd - had esa 1/14 (next due 1/28) 6. Renal osteodystrophy -ordered aphos for 1/20-> 4.8 (within goal) 7. COPD /chron resp failure - at home she req O2 5L 8. Pall Care - pt is enrolled in a community-based Palliative Care program at home. Consider PC consult for [facility].  Subjective:   Appears less dyspneic today, on 10L O2 [place] Denies f/myalgias/nausea   Objective:   BP 128/61 (BP Location: Left Arm)   Pulse 69   Temp 98 F (36.7 C) (Axillary)   Resp (!) 22   Ht 5\' 1"  (1.549 m)   Wt 88 kg   SpO2 96%   BMI 36.66 kg/m   Intake/Output Summary (Last 24 hours) at 02/18/2019 [id number] Last data filed at 02/17/2019 1300 Gross per 24 hour  Intake 75 ml  Output --  Net 75 ml   Weight change: -5.4 kg  Physical Exam: Gen pleasant, breathing more comfortably today No jvd or bruits Chest clear bilatbilat, distant BS RRR no MRG Abd soft ntnd no mass or ascites +bsobese Extchronic 1+ distal pretib bilatedema, tender Neuro isawake but hard  of hearing,nonfocal Access RIJ TC  Imaging: DG CHEST PORT 1 VIEW  Result Date: 02/17/2019 CLINICAL DATA:  COVID-19 positive patient.  Dyspnea. EXAM: PORTABLE CHEST 1 VIEW COMPARISON:  Single-view of the chest 02/12/2019 and 02/09/2019. FINDINGS: Moderate right pleural effusion and basilar airspace disease have worsened. The left lung remains clear. Cardiac silhouette is largely obscured. No pneumothorax. Dialysis catheter noted. IMPRESSION: Moderate right pleural effusion and basilar airspace disease have worsened. No other change. Electronically Signed   By: Inge Rise M.D.   On: 02/17/2019 12:55    Labs: BMET Recent Labs  Lab 02/12/19 0349 02/13/19 0157 02/14/19 0221 02/15/19 0625 02/16/19 0541 02/17/19 0504  NA 136 136 135 140 138 134*  K 4.2 4.2 4.5 3.9 5.6* 3.6  CL 96* 96* 96* 101 101 93*  CO2 26 24 23 24 22 26   GLUCOSE 97 77 88 77 76 82  BUN 22 38* 51* 27* 44* 23  CREATININE 4.11* 5.91* 7.29* 4.69* 6.48* 4.06*  CALCIUM 8.4* 8.3* 8.4* 8.8* 8.9 8.8*  PHOS  --   --   --  4.8*  --  4.8*   CBC Recent Labs  Lab 02/12/19 0349 02/13/19 0157 02/14/19 0221  WBC 4.6 5.5 7.3  NEUTROABS 3.1 3.1 4.7  HGB 9.2* 9.3* 9.4*  HCT 30.6* 29.9* 30.2*  MCV 96.8 94.6 93.5  PLT 162 181 189    Medications:    . vitamin C  500 mg Oral Daily  . calcitRIOL  1.75 mcg Oral Q T,Th,Sa-HD  . Chlorhexidine Gluconate Cloth  6 each Topical Q0600  . clonazePAM  0.25 mg Oral Daily  . clonazepam  0.25 mg Oral QHS  . dexamethasone (DECADRON) injection  6 mg Intravenous Daily  . docusate sodium  100 mg Oral q morning - 10a  . feeding supplement (NEPRO CARB STEADY)  237 mL Oral BID BM  . FLUoxetine  10 mg Oral Daily  . guaiFENesin  600 mg Oral BID  . heparin  7,500 Units Subcutaneous Q8H  . insulin aspart  0-5 Units Subcutaneous QHS  . insulin aspart  0-6 Units Subcutaneous TID WC  . Ipratropium-Albuterol  2 puff Inhalation TID  . levothyroxine  112 mcg Oral QAC breakfast  . midodrine   10 mg Oral BID  . montelukast  10 mg Oral QHS  . multivitamin  1 tablet Oral QHS  . mupirocin ointment   Topical Daily  . rosuvastatin  40 mg Oral QHS  . senna  1 tablet Oral q morning - 10a  . sevelamer carbonate  1,600 mg Oral TID WC  . sodium chloride flush  3 mL Intravenous Q12H  . zinc sulfate  220 mg Oral Daily      Otelia Santee, MD 02/18/2019, 7:37 AM

## 2019-02-18 NOTE — Progress Notes (Signed)
PROGRESS NOTE    Joan Mosley  [id number] DOB: 28-Oct-1945 DOA: 02/09/2019 PCP: Flossie Buffy, NP    Brief Narrative: 74 year old lady with prior history of end-stage renal disease on dialysis, COPD,, chronic respiratory failure on 5 L of home oxygen, type 2 diabetes mellitus, hypothyroidism, TIA presented to ED from dialysis for evaluation of hypoxia.  Patient was tested positive for COVID-19 on 02/07/2019 and 20 minutes into her dialysis she was noted to be hypoxic and was brought to ED for further evaluation.  On arrival to ED patient was saturating 88% on nonrebreather and she was switched to 20 L of high flow oxygen and sats have been around 92%.  Chest x-ray showed asymmetric pulmonary opacities compatible with COVID-19 pneumonia mildly progressed in the lower lungs compared to prior chest x-ray from 2 days ago. She is now admitted for further evaluation and management on acute on chronic respiratory failure with hypoxia secondary to COVID-19 multifocal pneumonia. Nephrology consulted for dialysis. She underwent HD and her breathing has improved. We were able to wean her down to 9 lit of HF oxygen and her sats have remained well above 95%.  Meanwhile palliative care consulted for [place], plan for outpatient palliative care follow up .  Of note patient had missed one HD session and missed more than a week of her synthroid tablets while waiting for her scripts to be filled. Recommend rechecking thyroid panel in 3 to 4 weeks post discharge and adjusting medications as needed.  Pt still on 10-15 lit of HF oxygen.  Had decompensation on 1/23 evening.    Assessment & Plan:   Principal Problem:   Pneumonia due to COVID-19 virus Active Problems:   Type 2 diabetes mellitus with diabetic polyneuropathy, with long-term current use of insulin (HCC)   ESRD (end stage renal disease) (HCC)   Acute on chronic respiratory failure with hypoxia (HCC)   COPD (chronic obstructive pulmonary disease)  ([place])   COVID-19   Acute on chronic respiratory failure with hypoxia secondary to COVID-19 multifocal pneumonia Patient tested positive for Covid on 02/07/2019.  She initially required up to 20 L of high flow nasal cannula oxygen but after dialysis was weaned down to 10 L but then up to 15 L/min again of high flow oxygen.  PT evaluation ordered recommending SNF but her son and HPOA not wanting this. She has been to [facility] before and her son is open for her placement there (she has been at [facility] before). CM consult for LTACH placement.   -continue with incentive spirometry and flutter valve. -Repeat CXR reviewed with Radiologist, indicating atelectasis, small effusion not amenable for thoracentesis.  -Requested chest physiotherapy as tolerated.  -Wean off O2 supplementation as tolerated, down to 10L/min but up to 15 hfnc again today with increase in work of breathing.  -Ordering ABG, CTA PE study (patient has been on heparin [place])  Left knee pain.  Noted on 1/22, Venous Doppler US ordered but has not been done yet.  CTA PE protocol ordered on 1/23 due to worsening respiratory status.   LDH is within normal limits CRP is elevated, pro calcitonin less than 0.5, WBC count within normal limits, MRSA PCR is negative. Chest x-ray on admission showed asymmetric pulmonary opacity compatible with COVID-19 pneumonia  Mild progression in both lower lungs since 02/07/2019. Continue IV Decadron 6 mg daily Completed IV remdesivir She will need to quarantine till 02/27/19. She remains afebrile and wbc count within normal limits.    Hypothyroidism Continue with Synthroid  112 MCG daily. TSH abnormal and high at 14.  As per the son patient missed her Synthroid for more than a week while waiting for her prescription to be filled out.  Recommend outpatient follow-up with PCP for repeat thyroid panel in about 3 weeks.   End-stage renal disease on dialysis Tuesday Thursday and Saturday. Volume management as per  dialysis Nephrology following, HD per their recommendations.   Insulin-dependent diabetes mellitus with polyneuropathy and chronic kidney disease CBG (last 3)  Recent Labs    02/18/19 0801 02/18/19 1419 02/18/19 1446  GLUCAP 71 57* 72   Discontinued lantus as her CBG were in the 60's in the am and after dialysis.  Continue with SSI. Last A1c is 5.6.    Body mass index is 36.66 kg/m. Morbidly obese DVT prophylaxis heparin  7500 TID.     History of COPD, advanced No wheezing heard Continue with bronchodilators as needed. Started Mucinex   Pressure injury on admission.   Pressure Injury 02/13/19 Heel Left Deep Tissue Pressure Injury - Purple or maroon localized area of discolored intact skin or blood-filled blister due to damage of underlying soft tissue from pressure and/or shear. (Active)  02/13/19 1000  Location: Heel  Location Orientation: Left  Staging: Deep Tissue Pressure Injury - Purple or maroon localized area of discolored intact skin or blood-filled blister due to damage of underlying soft tissue from pressure and/or shear.  Wound Description (Comments):   Present on Admission:    Wound care consulted.       DVT prophylaxis: Heparin Code Status: Full code Family Communication: None at bedside, updated her son over the phone in detail on 1/21 Disposition Plan: pending improvement in hypoxia, will likely need LTACH placement.  Consultants:   Nephrology  Palliative care consulted  Wound care.   Procedures: None Antimicrobials: None  Subjective: She felt tired, had no respiratory distress in the morning or afternoon but respiratory status declined later in the afternoon. On 15L/min HFNC with O2 sats in the upper 80s. Rapid response called. Patient being evaluated for possible transfer to ICU. ABG ordered and pending.   Objective: Vitals:   02/18/19 1300 02/18/19 1409 02/18/19 1501 02/18/19 1503  BP:  (!) 115/54    Pulse:      Resp:  (!) 22      Temp:  98 F (36.7 C)    TempSrc:  Axillary    SpO2: 91% (!) 84% (!) 85% 97%  Weight:      Height:        Intake/Output Summary (Last 24 hours) at 02/18/2019 1525 Last data filed at 02/18/2019 1500 Gross per 24 hour  Intake 291.07 ml  Output --  Net 291.07 ml   Filed Weights   02/16/19 1800 02/17/19 0432 02/18/19 0350  Weight: 91.4 kg 90.9 kg 88 kg    Examination:  General exam: Resting in bed,tired appearing Respiratory system:  shallow breaths, no wheezing.  Cardiovascular system: S1S2 present, RRR.  Gastrointestinal system: Abdomen is soft, non tender non distended Central nervous system: arousable,  oriented, able to answer all questions appropriately, follows commands. No facial droop.   Extremities: no cyanosis or clubbing.  Skin: left heel deep injury.  Psychiatry: mood is appropriate.     Data Reviewed: I have personally reviewed following labs and imaging studies  CBC: Recent Labs  Lab 02/12/19 0349 02/13/19 0157 02/14/19 0221  WBC 4.6 5.5 7.3  NEUTROABS 3.1 3.1 4.7  HGB 9.2* 9.3* 9.4*  HCT 30.6* 29.9* 30.2*  MCV 96.8 94.6 93.5  PLT 162 181 [id number]   Basic Metabolic Panel: Recent Labs  Lab 02/13/19 0157 02/14/19 0221 02/15/19 0625 02/16/19 0541 02/17/19 0504  NA 136 135 140 138 134*  K 4.2 4.5 3.9 5.6* 3.6  CL 96* 96* 101 101 93*  CO2 24 23 24 22 26   GLUCOSE 77 88 77 76 82  BUN 38* 51* 27* 44* 23  CREATININE 5.91* 7.29* 4.69* 6.48* 4.06*  CALCIUM 8.3* 8.4* 8.8* 8.9 8.8*  PHOS  --   --  4.8*  --  4.8*   GFR: Estimated Creatinine Clearance: 12.1 mL/min (A) (by C-G formula based on SCr of 4.06 mg/dL (H)). Liver Function Tests: Recent Labs  Lab 02/12/19 0349 02/13/19 0157 02/17/19 0504  AST 22 20  --   ALT 15 12  --   ALKPHOS 67 63  --   BILITOT 0.8 0.9  --   PROT 5.1* 4.7*  --   ALBUMIN 2.3* 2.3* 3.0*   No results for input(s): LIPASE, AMYLASE in the last 168 hours. No results for input(s): AMMONIA in the last 168 hours. Coagulation  Profile: No results for input(s): INR, PROTIME in the last 168 hours. Cardiac Enzymes: No results for input(s): CKTOTAL, CKMB, CKMBINDEX, TROPONINI in the last 168 hours. BNP (last 3 results) No results for input(s): PROBNP in the last 8760 hours. HbA1C: No results for input(s): HGBA1C in the last 72 hours. CBG: Recent Labs  Lab 02/17/19 1543 02/17/19 2120 02/18/19 0801 02/18/19 1419 02/18/19 1446  GLUCAP 160* 134* 71 57* 72   Lipid Profile: No results for input(s): CHOL, HDL, LDLCALC, TRIG, CHOLHDL, LDLDIRECT in the last 72 hours. Thyroid Function Tests: No results for input(s): TSH, T4TOTAL, FREET4, T3FREE, THYROIDAB in the last 72 hours. Anemia Panel: No results for input(s): VITAMINB12, FOLATE, FERRITIN, TIBC, IRON, RETICCTPCT in the last 72 hours. Sepsis Labs: Recent Labs  Lab 02/12/19 0349 02/13/19 0157  PROCALCITON 0.57 0.49    Recent Results (from the past 240 hour(s))  MRSA PCR Screening     Status: None   Collection Time: 02/11/19  1:21 AM   Specimen: Nasal Mucosa; Nasopharyngeal  Result Value Ref Range Status   MRSA by PCR NEGATIVE NEGATIVE Final    Comment:        The GeneXpert MRSA Assay (FDA approved for NASAL specimens only), is one component of a comprehensive MRSA colonization surveillance program. It is not intended to diagnose MRSA infection nor to guide or monitor treatment for MRSA infections. Performed at [facility], [place] [address], [place], [place] [address]          Radiology Studies: DG CHEST PORT 1 VIEW  Result Date: 02/17/2019 CLINICAL DATA:  COVID-19 positive patient.  Dyspnea. EXAM: PORTABLE CHEST 1 VIEW COMPARISON:  Single-view of the chest 02/12/2019 and 02/09/2019. FINDINGS: Moderate right pleural effusion and basilar airspace disease have worsened. The left lung remains clear. Cardiac silhouette is largely obscured. No pneumothorax. Dialysis catheter noted. IMPRESSION: Moderate right pleural effusion and basilar  airspace disease have worsened. No other change. Electronically Signed   By: Inge Rise M.D.   On: 02/17/2019 12:55        Scheduled Meds: . vitamin C  500 mg Oral Daily  . calcitRIOL  1.75 mcg Oral Q T,Th,Sa-HD  . Chlorhexidine Gluconate Cloth  6 each Topical Q0600  . clonazePAM  0.25 mg Oral Daily  . clonazepam  0.25 mg Oral QHS  . docusate sodium  100 mg  Oral q morning - 10a  . feeding supplement (NEPRO CARB STEADY)  237 mL Oral BID BM  . FLUoxetine  10 mg Oral Daily  . guaiFENesin  600 mg Oral BID  . heparin  7,500 Units Subcutaneous Q8H  . insulin aspart  0-5 Units Subcutaneous QHS  . insulin aspart  0-6 Units Subcutaneous TID WC  . Ipratropium-Albuterol  2 puff Inhalation TID  . levothyroxine  112 mcg Oral QAC breakfast  . midodrine  10 mg Oral BID  . montelukast  10 mg Oral QHS  . multivitamin  1 tablet Oral QHS  . mupirocin ointment   Topical Daily  . rosuvastatin  40 mg Oral QHS  . senna  1 tablet Oral q morning - 10a  . sevelamer carbonate  1,600 mg Oral TID WC  . sodium chloride flush  3 mL Intravenous Q12H  . zinc sulfate  220 mg Oral Daily   Continuous Infusions: . sodium chloride Stopped (02/14/19 1322)     LOS: 9 days     Time spent: 35 minutes   Blain Pais, MD [facility]    02/18/2019, 3:25 PM

## 2019-02-18 NOTE — Consult Note (Signed)
NAME:  Joan Mosley, MRN:  [id number], DOB:  [birth year], LOS: 9 ADMISSION DATE:  02/09/2019, CONSULTATION DATE:  02/18/19 REFERRING MD:  Milbert Coulter, CHIEF COMPLAINT:  Hypoxia    Brief History   74 year old female with past medical history significant for ESRD and COPD on 5 L home O2 who was admitted 1/15 with Covid-19 pneumonia.  She has been treated with remdesivir and Decadron and had increased oxygen requirement on 15 L high flow nasal cannula, PCCM consulted.  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  She initially required 20 L HFNC, this improved to 10 L after dialysis.  She was treated with standard Covid-19 therapies including IV Decadron, IV remdesivir, aztreonam and vancomycin.  Over the last day, oxygen requirements up-trended, the patient also had some lower leg pain so CT chest was obtained showing near complete collapse of the right middle and right lower lobes and consolidation in the right upper lobe concerning for pneumonia or aspiration, patient has been DNR in the past but is currently full code so PCCM consulted.   On evaluation, patient is comfortable with oxygen sats of 100% on 15 L high flow and nonrebreather.  She states her breathing does not feel worse than prior, per nursing patient is eating a regular diet and feeding herself, no known vomiting episodes or aspiration events.  Patient states she "wants to live" however does not want to live long-term on life support.    ABG 7.4/41.7/75.2/25.7  Past Medical History   has a past medical history of Arthritis, COPD (chronic obstructive pulmonary disease) ([place]), Diabetes mellitus without complication ([place]), Diabetic neuropathy ([place]), Oxygen dependent (03/30/2018), Renal disorder, Thyroid disease, and  TIA (transient ischemic attack).   Significant Hospital Events   1/15 admit to hospitalist 1/23 increasing oxygen requirement, PCCM consulted  Consults:  Nephrology PCCM  Procedures:    Significant Diagnostic Tests:  1/14 CXR>>Asymmetric pulmonary opacity compatible with COVID-19 pneumonia 1/23 CT chest>> no PE,Near complete collapse of both the right middle and right lower lobes with extensive endobronchial debris, Consolidation involving the right upper lobe concerning forpneumonia or aspiration pneumonia. 1/24 bilateral LE Dopplers>>  Micro Data:  1/12 SARS-Covid-2>> positive 1/16 MRSA screen>>negative 1/23 blood cultures x2>> 1/23 respiratory culture>>  Antimicrobials:  Aztreonam 1/14-1/18 Vancomycin 1/14-1/16 Remdesivir 1/14-1-19 Zosyn 1/23-  Interim history/subjective:  Patient alert, oriented and conversational on PCCM evaluation, able to remove nonrebreather mask and oxygen saturations remained greater than 90% on 12 L high flow nasal cannula  Objective   Blood pressure 127/69, pulse 69, temperature 98 F (36.7 C), temperature source Axillary, resp. rate (!) 23, height 5\' 1"  (1.549 m), weight 88 kg, SpO2 97 %.        Intake/Output Summary (Last 24 hours) at 02/18/2019 2041 Last data filed at 02/18/2019 1500 Gross per 24 hour  Intake 291.07 ml  Output --  Net 291.07 ml   Filed Weights   02/16/19 1800 02/17/19 0432 02/18/19 0350  Weight: 91.4 kg 90.9 kg 88 kg   General: Elderly female, well-nourished, awake and alert and in no distress HEENT: MM pink/moist Neuro: Moves all extremities, oriented x3, no focal deficits  CV: s1s2 regular rate and rhythm, no m/r/g PULM: Mildly tachypneic without severe respiratory distress or accessory muscle use, diminished air movement bilaterally without  wheezes or rhonchi GI: soft, bsx4 active  Extremities: warm/dry, 1+ edema, bandage skin ulcers noted Skin: no rashes or lesions  Resolved Hospital Problem list      Assessment & Plan:    Acute on chronic hypoxic respiratory failure in the setting COPD on home oxygen, Covid-19 pneumonia and likely aspiration event -Completed Covid-19 therapy -No PE on CT P: -Patient is in no distress and maintaining adequate oxygenation on HFNC, continue and transition to heated high flow with Ventimask as needed -Attempt to avoid intubation, would likely be difficult to wean from the vent -Start Flagyl and aztreonam for aspiration pneumonia (PCN allergy) -Obtain sputum and blood cultures -Encourage incentive spirometer and chest PT -Continue Mucinex, Combivent inhaler, Singulair, add ICS Pulmicort -Consult speech for swallow eval in the setting of likely aspiration event -Patient expressed a wish not to live on life support, per notes has been enrolled in outpatient palliative care program, would benefit from [place] discussion, will consult palliative  ESRD -Nephrology following, dialysis TTS P: -Continue to follow electrolytes -Continue Renvela, calcitriol   Type 2 diabetes -Glucose well controlled during admission A1c 01/2019 was 6.0 P: -Continue SSI and point-of-care glucose monitoring   History of hypothyroidism -TSH this admission was elevated at 14, per notes she had missed her Synthroid for some time while waiting on a prescription refill P: -Continue current Synthroid dose of 112 MCG daily, will need repeat thyroid testing outpatient   History of TIA - echo done 08/2018 with EF of 60 to 65% and suboptimal evaluation of PFO/ASD P: -Continue statin    Best practice:  Diet: N.p.o. until passes bedside swallow, then advance as tolerated Pain/Anxiety/Delirium protocol (if indicated): N/A VAP protocol (if indicated): N/A DVT prophylaxis: Heparin GI prophylaxis: N/A Glucose control: SSI Mobility: Bedrest Code Status: Full Family Communication: per primary Disposition: progressive care  Labs   CBC: Recent Labs  Lab 02/12/19 0349  02/13/19 0157 02/14/19 0221  WBC 4.6 5.5 7.3  NEUTROABS 3.1 3.1 4.7  HGB 9.2* 9.3* 9.4*  HCT 30.6* 29.9* 30.2*  MCV 96.8 94.6 93.5  PLT 162 181 [id number]    Basic Metabolic Panel: Recent Labs  Lab 02/13/19 0157 02/14/19 0221 02/15/19 0625 02/16/19 0541 02/17/19 0504  NA 136 135 140 138 134*  K 4.2 4.5 3.9 5.6* 3.6  CL 96* 96* 101 101 93*  CO2 24 23 24 22 26   GLUCOSE 77 88 77 76 82  BUN 38* 51* 27* 44* 23  CREATININE 5.91* 7.29* 4.69* 6.48* 4.06*  CALCIUM 8.3* 8.4* 8.8* 8.9 8.8*  PHOS  --   --  4.8*  --  4.8*   GFR: Estimated Creatinine Clearance: 12.1 mL/min (A) (by C-G formula based on SCr of 4.06 mg/dL (H)). Recent Labs  Lab 02/12/19 0349 02/13/19 0157 02/14/19 0221  PROCALCITON 0.57 0.49  --   WBC 4.6 5.5 7.3    Liver Function Tests: Recent Labs  Lab 02/12/19 0349 02/13/19 0157 02/17/19 0504  AST 22 20  --   ALT 15 12  --   ALKPHOS 67 63  --   BILITOT 0.8 0.9  --   PROT 5.1* 4.7*  --   ALBUMIN 2.3* 2.3* 3.0*   No results for input(s): LIPASE, AMYLASE in the last 168 hours. No results for input(s): AMMONIA in the last 168 hours.  ABG    Component Value Date/Time   PHART 7.404 02/18/2019 1828   PCO2ART 41.7 02/18/2019 1828   PO2ART 75.2 (L) 02/18/2019 1828   HCO3  25.7 02/18/2019 1828   TCO2 28 02/10/2019 1712   ACIDBASEDEF 8.0 (H) 12/24/2018 1701   O2SAT 94.6 02/18/2019 1828     Coagulation Profile: No results for input(s): INR, PROTIME in the last 168 hours.  Cardiac Enzymes: No results for input(s): CKTOTAL, CKMB, CKMBINDEX, TROPONINI in the last 168 hours.  HbA1C: Hgb A1c MFr Bld  Date/Time Value Ref Range Status  02/14/2019 06:30 PM 6.0 (H) 4.8 - 5.6 % Final    Comment:    (NOTE) Pre diabetes:          5.7%-6.4% Diabetes:              >6.4% Glycemic control for   <7.0% adults with diabetes   11/18/2018 11:20 AM 5.6 4.8 - 5.6 % Final    Comment:    (NOTE) Pre diabetes:          5.7%-6.4% Diabetes:              >6.4% Glycemic  control for   <7.0% adults with diabetes     CBG: Recent Labs  Lab 02/17/19 2120 02/18/19 0801 02/18/19 1419 02/18/19 1446 02/18/19 1638  GLUCAP 134* 71 57* 72 182*    Review of Systems:   Negative except as noted in HPI  Past Medical History  She,  has a past medical history of Arthritis, COPD (chronic obstructive pulmonary disease) ([place]), Diabetes mellitus without complication ([place]), Diabetic neuropathy ([place]), Oxygen dependent (03/30/2018), Renal disorder, Thyroid disease, and TIA (transient ischemic attack).   Surgical History    Past Surgical History:  Procedure Laterality Date  . ABDOMINAL HYSTERECTOMY    . IR FLUORO GUIDE CV LINE RIGHT  11/14/2018  . RECTOCELE REPAIR    . REPLACEMENT TOTAL KNEE BILATERAL Bilateral 2008  . THYROIDECTOMY     80%  . VAGINAL WOUND CLOSURE / REPAIR       Social History   reports that she quit smoking about 37 years ago. She has a 35.00 pack-year smoking history. She has never used smokeless tobacco. She reports that she does not drink alcohol or use drugs.   Family History   Her family history includes Heart disease in her father and mother; Hypertension in her father and mother.   Allergies Allergies  Allergen Reactions  . Avelox [Moxifloxacin Hcl In Nacl] Other (See Comments)    Unknown reaction  . Codeine Anaphylaxis  . Penicillins Rash    Did it involve swelling of the face/tongue/throat, SOB, or low BP? Unk Did it involve sudden or severe rash/hives, skin peeling, or any reaction on the inside of your mouth or nose? Yes Did you need to seek medical attention at a hospital or doctor's office? Unk When did it last happen? Unk If all above answers are "NO", may proceed with cephalosporin use.   . Sulfa Antibiotics Other (See Comments)    Unknown reaction  . Dextromethorphan-Guaifenesin Other (See Comments)    Reported by [facility] - unknown reaction. Pt states she is not allergic to dextromethorphan-guaifenesin.  .  Shellfish Allergy Hives, Itching and Rash     Home Medications  Prior to Admission medications   Medication Sig Start Date End Date Taking? Authorizing Provider  acetaminophen (TYLENOL) 500 MG tablet Take 1,000 mg by mouth every 6 (six) hours as needed (for arthritic pain).    Yes [provider]  benzonatate (TESSALON) 200 MG capsule Take 1 capsule (200 mg total) by mouth 2 (two) times daily as needed for cough. 02/08/19  Yes Nche,  Charlene Brooke, NP  clonazePAM (KLONOPIN) 0.5 MG tablet 0.25mg  in AM and 0.5mg  in PM Patient taking differently: Take 0.25-0.5 mg by mouth 2 (two) times daily. 0.25mg  in AM and 0.5mg  in PM 02/08/19  Yes Nche, Charlene Brooke, NP  Darbepoetin Alfa (ARANESP) 25 MCG/0.42ML SOSY injection Inject 0.42 mLs (25 mcg total) into the vein every Tuesday with hemodialysis. 11/22/18  Yes Hosie Poisson, MD  docusate sodium (COLACE) 100 MG capsule Take 100 mg by mouth every morning.   Yes [provider]  FLUoxetine (PROZAC) 10 MG tablet Take 1 tablet (10 mg total) by mouth daily. 02/08/19  Yes Nche, Charlene Brooke, NP  furosemide (LASIX) 40 MG tablet Take 80 mg by mouth See admin instructions. Take 80 mg by mouth in the morning on Sun/Mon/Wed/Fri (non-dialysis days) 10/13/18  Yes [provider]  guaiFENesin-dextromethorphan (ROBITUSSIN DM) 100-10 MG/5ML syrup Take 20 mLs by mouth every 4 (four) hours as needed for cough.   Yes [provider]  insulin detemir (LEVEMIR) 100 UNIT/ML injection Inject 0.1 mLs (10 Units total) into the skin daily. Patient taking differently: Inject 15 Units into the skin daily before breakfast.  11/22/18  Yes Hosie Poisson, MD  insulin lispro (HUMALOG) 100 UNIT/ML injection Inject 1-2 Units into the skin See admin instructions. Inject 1-2 units into the skin three times a day before meals, per sliding scale: 1 unit for every 50 points above a BGL of 150   Yes [provider]  ipratropium-albuterol (DUONEB) 0.5-2.5 (3)  MG/3ML SOLN Take 3 mLs by nebulization See admin instructions. Nebulize and inhale 3 ml's into the lungs every four hours, while awake   Yes [provider]  levothyroxine (SYNTHROID) 112 MCG tablet Take 1 tablet (112 mcg total) by mouth daily before breakfast. 1 AM 02/08/19  Yes Nche, Charlene Brooke, NP  Lubricants (K-Y LUBRICANT JELLY SENSITIVE EX) Place 1 application into both nostrils as needed (as directed- for lubrication).    Yes [provider]  midodrine (PROAMATINE) 10 MG tablet Take 10 mg by mouth See admin instructions. Take 10 mg by mouth on Tuesday, Thursday, Saturday 45 minutes before dialysis   Yes [provider]  montelukast (SINGULAIR) 10 MG tablet Take 10 mg by mouth at bedtime.    Yes [provider]  multivitamin (RENA-VIT) TABS tablet Take 1 tablet by mouth at bedtime. 11/21/18  Yes Hosie Poisson, MD  NASAL SPRAY SALINE NA Place 1-2 sprays into both nostrils as needed (for congestion- "[employer] Simply Saline" brand).   Yes [provider]  Nutritional Supplements (FEEDING SUPPLEMENT, NEPRO CARB STEADY,) LIQD Take 237 mLs by mouth 2 (two) times daily between meals. 11/22/18  Yes Hosie Poisson, MD  nystatin (MYCOSTATIN/NYSTOP) powder Apply 1 g topically 2 (two) times daily as needed (as directed- to any rashes).    Yes [provider]  nystatin cream (MYCOSTATIN) Apply 1 application topically 2 (two) times daily as needed for dry skin.    Yes [provider]  OVER THE COUNTER MEDICATION Stool softner otc   Yes [provider]  OXYGEN Inhale 5-6 L/min into the lungs continuous.   Yes [provider]  oxymetazoline (AFRIN) 0.05 % nasal spray Place 1 spray into both nostrils 2 (two) times daily as needed for congestion.   Yes [provider]  rosuvastatin (CRESTOR) 40 MG tablet Take 1 tablet (40 mg total) by mouth at bedtime. 02/08/19  Yes Nche, Charlene Brooke, NP  senna (SENOKOT) 8.6 MG TABS  tablet Take 1 tablet by mouth every morning.   Yes [provider]  sevelamer carbonate (RENVELA) 800 MG tablet Take 2 tablets (1,600 mg total) by mouth 3 (three) times daily with meals. Patient taking differently: Take 800-1,600 mg by mouth See admin instructions. Take 1,600 mg by mouth three times a day with meals and 800-1,600 mg with each snack 11/21/18  Yes Hosie Poisson, MD  albuterol (PROVENTIL) (2.5 MG/3ML) 0.083% nebulizer solution Take 3 mLs (2.5 mg total) by nebulization 4 (four) times daily. Patient not taking: Reported on 02/09/2019 09/16/18   Mariel Aloe, MD  calcitRIOL (ROCALTROL) 0.25 MCG capsule Take 7 capsules (1.75 mcg total) by mouth Every Tuesday,Thursday,and Saturday with dialysis. 11/22/18   Hosie Poisson, MD  cinacalcet (SENSIPAR) 30 MG tablet Take 2 tablets (60 mg total) by mouth Every Tuesday,Thursday,and Saturday with dialysis. Patient not taking: Reported on 02/09/2019 11/22/18   Hosie Poisson, MD  lidocaine (LIDODERM) 5 % Place 1 patch onto the skin every 12 (twelve) hours as needed (back pain). Remove & Discard patch within 12 hours or as directed by MD     [provider]  VENTOLIN HFA 108 (90 Base) MCG/ACT inhaler Inhale 2 puffs into the lungs every 6 (six) hours as needed for wheezing or shortness of breath. 02/14/19   Nche, Charlene Brooke, NP     Critical care time: 55 minutes     CRITICAL CARE Performed by: Otilio Carpen Nolton Denis   Total critical care time: 55 minutes  Critical care time was exclusive of separately billable procedures and treating other patients.  Critical care was necessary to treat or prevent imminent or life-threatening deterioration.  Critical care was time spent personally by me on the following activities: development of treatment plan with patient and/or surrogate as well as nursing, discussions with consultants, evaluation of patient's response to treatment, examination of patient, obtaining history from patient or  surrogate, ordering and performing treatments and interventions, ordering and review of laboratory studies, ordering and review of radiographic studies, pulse oximetry and re-evaluation of patient's condition.   Otilio Carpen Yazen Rosko PA-C

## 2019-02-18 NOTE — Progress Notes (Signed)
Patient on 15L at 80%, using accessory muscles to breathe. MD made aware. Nonrebreather added for comfort breathing.

## 2019-02-18 NOTE — Significant Event (Signed)
Rapid Response Event Note  Overview: Time Called: [place] Arrival Time: 1825    Initial Focused Assessment: Patient with acute distress and desat earlier today while in HD.  O2 discovered to be displaced.  At the time she was cyanotic and diaphoretic.  When I saw her after she had returned to her room she had improved dramatically and was breathing easily on 13 L HFNC.  Nail beds mildly purple  Later this afternoon about 1730.  RN rounded on patient and found her to be significantly labored with O2 sats 80% on HFNC.  She placed patient on NRB and 15L HFNC, initially O2 sats 88%.  Per RN it took about 30 min for her O2 sats to improve.    When I was called about 1815  She was still mildly labored but her O2 sats were 98% on NRB and 15L HFNC.  Lung sounds clear, decreased on right.  Nail beds pink  ABG done Stat CTA chest done  Attempted to wean O2 back to HFNC, patient became very labored and O2 sats were 88%. Placed on NRB and 15L HFNC O2 sats improved quickly to 98% and she became less labored.   Interventions:  Plan of Care (if not transferred): CCM consult  Event Summary: Name of Physician Notified: Inis Sizer at [id number]    at       Event End Time: [id number]  Raliegh Ip

## 2019-02-18 NOTE — Plan of Care (Signed)

## 2019-02-19 ENCOUNTER — Inpatient Hospital Stay (HOSPITAL_COMMUNITY): Payer: Medicare Other

## 2019-02-19 DIAGNOSIS — U071 COVID-19: Secondary | ICD-10-CM

## 2019-02-19 DIAGNOSIS — M7989 Other specified soft tissue disorders: Secondary | ICD-10-CM

## 2019-02-19 LAB — GLUCOSE, CAPILLARY
Glucose-Capillary: 72 mg/dL (ref 70–99)
Glucose-Capillary: 121 mg/dL — ABNORMAL HIGH (ref 70–99)
Glucose-Capillary: 112 mg/dL — ABNORMAL HIGH (ref 70–99)
Glucose-Capillary: 119 mg/dL — ABNORMAL HIGH (ref 70–99)

## 2019-02-19 MED ORDER — DEXTROSE IN LACTATED RINGERS 5 % IV SOLN: INTRAVENOUS | Status: DC

## 2019-02-19 MED ORDER — DEXTROSE 5 % IV SOLN
500.0000 mg | Freq: Two times a day (BID) | INTRAVENOUS | Status: DC
  Administered 2019-02-20 – 2019-02-22 (×4): 500 mg via INTRAVENOUS
  Filled 2019-02-19 (×8): qty 0.5

## 2019-02-19 MED ORDER — RESOURCE THICKENUP CLEAR PO POWD
Freq: Once | ORAL | Status: AC
  Filled 2019-02-19: qty 125

## 2019-02-19 MED ORDER — DEXTROSE 5 % IV SOLN
500.0000 mg | Freq: Two times a day (BID) | INTRAVENOUS | Status: DC
  Filled 2019-02-19: qty 0.5

## 2019-02-19 NOTE — Progress Notes (Signed)
Patient remain on HFNC at 13L and o2 sat continue to fluctuate in saturation goal range. Antibiotic initiated as prescribed and patient tolerated. She is in no distress at this time. She continues to express her desire to go home and see her family but understands that her situation has to improve before that can happen. No laboring in breathing at this time. Will continue to monitor.

## 2019-02-19 NOTE — Progress Notes (Signed)
PROGRESS NOTE    Joan Mosley  [id number] DOB: 07/30/1945 DOA: 02/09/2019 PCP: Flossie Buffy, NP    Brief Narrative: 74 year old lady with prior history of end-stage renal disease on dialysis, COPD,, chronic respiratory failure on 5 L of home oxygen, type 2 diabetes mellitus, hypothyroidism, TIA presented to ED from dialysis for evaluation of hypoxia.  Patient was tested positive for COVID-19 on 02/07/2019 and 20 minutes into her dialysis she was noted to be hypoxic and was brought to ED for further evaluation.  On arrival to ED patient was saturating 88% on nonrebreather and she was switched to 20 L of high flow oxygen and sats have been around 92%.  Chest x-ray showed asymmetric pulmonary opacities compatible with COVID-19 pneumonia mildly progressed in the lower lungs compared to prior chest x-ray from 2 days ago. She is now admitted for further evaluation and management on acute on chronic respiratory failure with hypoxia secondary to COVID-19 multifocal pneumonia. Nephrology consulted for dialysis. She underwent HD and her breathing has improved. We were able to wean her down to 9 lit of HF oxygen and her sats have remained well above 95%.  Meanwhile palliative care consulted for [place], plan for outpatient palliative care follow up .  Of note patient had missed one HD session and missed more than a week of her synthroid tablets while waiting for her scripts to be filled. Recommend rechecking thyroid panel in 3 to 4 weeks post discharge and adjusting medications as needed.  Pt still on 10-15 lit of HF oxygen.  Had decompensation on 1/23 evening.    Assessment & Plan:   Principal Problem:   Pneumonia due to COVID-19 virus Active Problems:   Type 2 diabetes mellitus with diabetic polyneuropathy, with long-term current use of insulin (HCC)   ESRD (end stage renal disease) (HCC)   Acute on chronic respiratory failure with hypoxia (HCC)   COPD (chronic obstructive pulmonary disease)  (HCC)   COVID-19   Aspiration into airway   Acute on chronic respiratory failure with hypoxia secondary to COVID-19 multifocal pneumonia Patient tested positive for Covid on 02/07/2019.  She initially required up to 20 L of high flow nasal cannula oxygen but after dialysis was weaned down to 10 L but then up to 15 L/min again of high flow oxygen.  PT evaluation ordered recommending SNF but her son and HPOA not wanting this. She has been to [facility] before and her son is open for her placement there (she has been at [facility] before). CM consult for LTACH placement.   -continue with incentive spirometry and flutter valve. -Repeat CXR reviewed with Radiologist, indicating atelectasis, small effusion not amenable for thoracentesis.  -Requested chest physiotherapy as tolerated.  -Wean off O2 supplementation as tolerated, O2 sats have fluctuated, weaning off O2 supplementation today, down to 7L/min HFNC.  -Possible aspiration on 1/25. Continue Abx peer PCCM. Continue aspiration precautions. SLP has evaluated,patient now on dysphagia 3 diet.    Left knee pain.  Noted on 1/22, Venous Doppler US ordered, completed on 1/24, showed no DVT.   CTA PE protocol ordered on 1/23 due to worsening respiratory status, showed no PE.     LDH is within normal limits CRP is elevated, pro calcitonin less than 0.5, WBC count within normal limits, MRSA PCR is negative. Chest x-ray on admission showed asymmetric pulmonary opacity compatible with COVID-19 pneumonia  Mild progression in both lower lungs since 02/07/2019. Completed IV Decadron 6 mg daily Completed IV remdesivir She will need to quarantine  till 02/27/19. She remains afebrile and wbc count within normal limits.    Hypothyroidism Continue with Synthroid 112 MCG daily. TSH abnormal and high at 14.  As per the son patient missed her Synthroid for more than a week while waiting for her prescription to be filled out.  Recommend outpatient follow-up with PCP for  repeat thyroid panel in about 3 weeks.   End-stage renal disease on dialysis Tuesday Thursday and Saturday. Volume management as per dialysis Nephrology following, HD per their recommendations.   Insulin-dependent diabetes mellitus with polyneuropathy and chronic kidney disease CBG (last 3)  Recent Labs    02/19/19 0803 02/19/19 1207 02/19/19 1644  GLUCAP 72 121* 112*   Discontinued lantus as her CBG were in the 60's in the am and after dialysis.  Continue with SSI. Last A1c is 5.6.  Was on D5 infusion briefly on 1/24 due to NPO status.    Body mass index is 36.66 kg/m. Morbidly obese DVT prophylaxis heparin  7500 TID.     History of COPD, advanced No wheezing heard Continue with bronchodilators as needed. Started Mucinex   Pressure injury on admission.   Pressure Injury 02/13/19 Heel Left Deep Tissue Pressure Injury - Purple or maroon localized area of discolored intact skin or blood-filled blister due to damage of underlying soft tissue from pressure and/or shear. (Active)  02/13/19 1000  Location: Heel  Location Orientation: Left  Staging: Deep Tissue Pressure Injury - Purple or maroon localized area of discolored intact skin or blood-filled blister due to damage of underlying soft tissue from pressure and/or shear.  Wound Description (Comments):   Present on Admission:    Wound care consulted.       DVT prophylaxis: Heparin Code Status: Full code Family Communication: Discussed with the patient, she reported that her son would call her today and she would discuss the plan with him.  Disposition Plan: pending improvement in hypoxia, will likely need LTACH placement.  Consultants:   Nephrology  Palliative care consulted  Wound care.   Procedures: None Antimicrobials: None  Subjective: She is more engaged today, more interactive. No respiratory distress.   Objective: Vitals:   02/19/19 1534 02/19/19 1600 02/19/19 1700 02/19/19 1754  BP:  111/68     Pulse:      Resp: (!) 23 20  20   Temp:      TempSrc:      SpO2: (!) 87% 93% 93% 93%  Weight:      Height:        Intake/Output Summary (Last 24 hours) at 02/19/2019 1808 Last data filed at 02/19/2019 0347 Gross per 24 hour  Intake 200 ml  Output --  Net 200 ml   Filed Weights   02/16/19 1800 02/17/19 0432 02/18/19 0350  Weight: 91.4 kg 90.9 kg 88 kg    Examination:  General exam: Resting in bed,tired appearing Respiratory system:  shallow breaths, no wheezing.  Cardiovascular system: S1S2 present, RRR.  Gastrointestinal system: Abdomen is soft, non tender non distended Central nervous system: arousable,  oriented, able to answer all questions appropriately, follows commands. No facial droop.   Extremities: no cyanosis or clubbing.  Skin: left heel deep injury.  Psychiatry: mood is appropriate.     Data Reviewed: I have personally reviewed following labs and imaging studies  CBC: Recent Labs  Lab 02/13/19 0157 02/14/19 0221  WBC 5.5 7.3  NEUTROABS 3.1 4.7  HGB 9.3* 9.4*  HCT 29.9* 30.2*  MCV 94.6 93.5  PLT 181 189  Basic Metabolic Panel: Recent Labs  Lab 02/13/19 0157 02/14/19 0221 02/15/19 0625 02/16/19 0541 02/17/19 0504  NA 136 135 140 138 134*  K 4.2 4.5 3.9 5.6* 3.6  CL 96* 96* 101 101 93*  CO2 24 23 24 22 26   GLUCOSE 77 88 77 76 82  BUN 38* 51* 27* 44* 23  CREATININE 5.91* 7.29* 4.69* 6.48* 4.06*  CALCIUM 8.3* 8.4* 8.8* 8.9 8.8*  PHOS  --   --  4.8*  --  4.8*   GFR: Estimated Creatinine Clearance: 12.1 mL/min (A) (by C-G formula based on SCr of 4.06 mg/dL (H)). Liver Function Tests: Recent Labs  Lab 02/13/19 0157 02/17/19 0504  AST 20  --   ALT 12  --   ALKPHOS 63  --   BILITOT 0.9  --   PROT 4.7*  --   ALBUMIN 2.3* 3.0*   No results for input(s): LIPASE, AMYLASE in the last 168 hours. No results for input(s): AMMONIA in the last 168 hours. Coagulation Profile: No results for input(s): INR, PROTIME in the last 168  hours. Cardiac Enzymes: No results for input(s): CKTOTAL, CKMB, CKMBINDEX, TROPONINI in the last 168 hours. BNP (last 3 results) No results for input(s): PROBNP in the last 8760 hours. HbA1C: No results for input(s): HGBA1C in the last 72 hours. CBG: Recent Labs  Lab 02/18/19 1638 02/18/19 2117 02/19/19 0803 02/19/19 1207 02/19/19 1644  GLUCAP 182* 157* 72 121* 112*   Lipid Profile: No results for input(s): CHOL, HDL, LDLCALC, TRIG, CHOLHDL, LDLDIRECT in the last 72 hours. Thyroid Function Tests: No results for input(s): TSH, T4TOTAL, FREET4, T3FREE, THYROIDAB in the last 72 hours. Anemia Panel: No results for input(s): VITAMINB12, FOLATE, FERRITIN, TIBC, IRON, RETICCTPCT in the last 72 hours. Sepsis Labs: Recent Labs  Lab 02/13/19 0157  PROCALCITON 0.49    Recent Results (from the past 240 hour(s))  MRSA PCR Screening     Status: None   Collection Time: 02/11/19  1:21 AM   Specimen: Nasal Mucosa; Nasopharyngeal  Result Value Ref Range Status   MRSA by PCR NEGATIVE NEGATIVE Final    Comment:        The GeneXpert MRSA Assay (FDA approved for NASAL specimens only), is one component of a comprehensive MRSA colonization surveillance program. It is not intended to diagnose MRSA infection nor to guide or monitor treatment for MRSA infections. Performed at [facility], [place] [address] [place], [place] [address]   Culture, blood (routine x 2)     Status: None (Preliminary result)   Collection Time: 02/18/19 10:14 PM   Specimen: BLOOD RIGHT HAND  Result Value Ref Range Status   Specimen Description BLOOD RIGHT HAND  Final   Special Requests   Final    BOTTLES DRAWN AEROBIC AND ANAEROBIC Blood Culture adequate volume   Culture   Final    NO GROWTH < 12 HOURS Performed at [facility], [place] [address] [place], [place] [address]    Report Status PENDING  Incomplete  Culture, blood (routine x 2)     Status: None (Preliminary result)   Collection Time:  02/18/19 10:20 PM   Specimen: BLOOD LEFT HAND  Result Value Ref Range Status   Specimen Description BLOOD LEFT HAND  Final   Special Requests   Final    BOTTLES DRAWN AEROBIC ONLY Blood Culture results may not be optimal due to an inadequate volume of blood received in culture bottles   Culture   Final  NO GROWTH < 12 HOURS Performed at [facility] [address] [place], [place] [address]    Report Status PENDING  Incomplete         Radiology Studies: CT ANGIO CHEST PE W OR WO CONTRAST  Result Date: 02/18/2019 CLINICAL DATA:  Dyspnea.  Cardiac arrhythmia.  PE.  COVID positive. EXAM: CT ANGIOGRAPHY CHEST WITH CONTRAST TECHNIQUE: Multidetector CT imaging of the chest was performed using the standard protocol during bolus administration of intravenous contrast. Multiplanar CT image reconstructions and MIPs were obtained to evaluate the vascular anatomy. CONTRAST:  149mL OMNIPAQUE IOHEXOL 350 MG/ML SOLN COMPARISON:  11/16/2018 FINDINGS: Cardiovascular: Contrast injection is sufficient to demonstrate satisfactory opacification of the pulmonary arteries to the segmental level. There is no pulmonary embolus. The main pulmonary artery is significantly dilated measuring up to approximately 4 cm in diameter. The main right pulmonary artery is dilated measuring approximately 4 cm in diameter. The main left pulmonary artery is dilated measuring approximately 3.4 cm in diameter. There is no CT evidence of acute right heart strain. There are atherosclerotic changes of the visualized thoracic aorta without evidence for thoracic aortic aneurysm. Heart size is enlarged. Advanced coronary artery calcifications are noted. Mediastinum/Nodes: --mild mediastinal adenopathy is noted, presumably reactive. --No axillary lymphadenopathy. --No supraclavicular lymphadenopathy. --Normal thyroid gland. --The esophagus is unremarkable Lungs/Pleura: Emphysematous changes are noted. There is near complete collapse of  the left lower lobe with extensive debris within bronchus intermedius and the right lower lobe bronchus. There is near complete collapse of the right middle lobe. Airspace consolidation is noted in the right upper lobe. There is a small right-sided pleural effusion. Upper Abdomen: No acute abnormality. Musculoskeletal: There is no acute displaced fracture. There are old bilateral rib fractures. Review of the MIP images confirms the above findings. IMPRESSION: 1. No acute pulmonary embolism. 2. Near complete collapse of both the right middle and right lower lobes with extensive endobronchial debris within bronchus intermedius. Findings could be secondary to aspiration in the appropriate clinical setting. 3. Consolidation involving the right upper lobe concerning for pneumonia or aspiration pneumonia. 4. Small right-sided pleural effusion. 5. Significantly dilated pulmonary arteries which can be seen in patients with pulmonary artery hypertension. 6. Cardiomegaly.  Coronary artery disease is noted. Aortic Atherosclerosis (ICD10-I70.0) and Emphysema (ICD10-J43.9). Electronically Signed   By: Constance Holster M.D.   On: 02/18/2019 19:32   VAS [name] LOWER EXTREMITY VENOUS (DVT)  Result Date: 02/19/2019  Lower Venous Study Indications: Swelling, and COVID positive.  Limitations: Body habitus and poor ultrasound/tissue interface. Comparison Study: No prior study. Performing Technologist: Maudry Mayhew MHA, RDMS, RVT, RDCS  Examination Guidelines: A complete evaluation includes B-mode imaging, spectral Doppler, color Doppler, and power Doppler as needed of all accessible portions of each vessel. Bilateral testing is considered an integral part of a complete examination. Limited examinations for reoccurring indications may be performed as noted.  +---------+---------------+---------+-----------+----------+--------------+ RIGHT    CompressibilityPhasicitySpontaneityPropertiesThrombus Aging  +---------+---------------+---------+-----------+----------+--------------+ CFV      Full           No       Yes                                 +---------+---------------+---------+-----------+----------+--------------+ FV Prox  Full                                                        +---------+---------------+---------+-----------+----------+--------------+  FV Mid   Full                                                        +---------+---------------+---------+-----------+----------+--------------+ FV DistalFull                                                        +---------+---------------+---------+-----------+----------+--------------+ PFV      Full                                                        +---------+---------------+---------+-----------+----------+--------------+ POP      Full           No       Yes                                 +---------+---------------+---------+-----------+----------+--------------+   Right Technical Findings: Not visualized segments include right saphenofemoral junction, posterior tibial, and peroneal veins.  +---------+---------------+---------+-----------+----------+--------------+ LEFT     CompressibilityPhasicitySpontaneityPropertiesThrombus Aging +---------+---------------+---------+-----------+----------+--------------+ FV Mid   Full                                                        +---------+---------------+---------+-----------+----------+--------------+ FV DistalFull                                                        +---------+---------------+---------+-----------+----------+--------------+ POP      Full           No       Yes                                 +---------+---------------+---------+-----------+----------+--------------+   Left Technical Findings: Not visualized segments include left saphenofemoral junction, common femoral, profunda femoral, proximal femoral,  posterior tibial, and peroneal veins.   Summary: Right: There is no evidence of deep vein thrombosis in the lower extremity. However, portions of this examination were limited- see technologist comments above. No cystic structure found in the popliteal fossa. Left: There is no evidence of deep vein thrombosis in the lower extremity. However, portions of this examination were limited- see technologist comments above. No cystic structure found in the popliteal fossa.  Bilateral lower extremity venous flow is pulsatile, suggestive of possible elevated right heart pressure. *See table(s) above for measurements and observations.    Preliminary         Scheduled Meds: . vitamin C  500 mg Oral Daily  . budesonide (PULMICORT) nebulizer solution  0.25 mg Nebulization BID  . calcitRIOL  1.75 mcg Oral Q T,Th,Sa-HD  .  Chlorhexidine Gluconate Cloth  6 each Topical Q0600  . clonazePAM  0.25 mg Oral Daily  . clonazepam  0.25 mg Oral QHS  . docusate sodium  100 mg Oral q morning - 10a  . feeding supplement (NEPRO CARB STEADY)  237 mL Oral BID BM  . FLUoxetine  10 mg Oral Daily  . guaiFENesin  600 mg Oral BID  . heparin  7,500 Units Subcutaneous Q8H  . insulin aspart  0-5 Units Subcutaneous QHS  . insulin aspart  0-6 Units Subcutaneous TID WC  . Ipratropium-Albuterol  2 puff Inhalation TID  . levothyroxine  112 mcg Oral QAC breakfast  . midodrine  10 mg Oral BID  . montelukast  10 mg Oral QHS  . multivitamin  1 tablet Oral QHS  . mupirocin ointment   Topical Daily  . rosuvastatin  40 mg Oral QHS  . senna  1 tablet Oral q morning - 10a  . sevelamer carbonate  1,600 mg Oral TID WC  . sodium chloride  2 spray Each Nare TID  . sodium chloride flush  3 mL Intravenous Q12H  . zinc sulfate  220 mg Oral Daily   Continuous Infusions: . sodium chloride Stopped (02/14/19 1322)  . [START ON 02/20/2019] aztreonam    . metronidazole Stopped (02/19/19 1341)     LOS: 10 days     Time spent: 35  minutes   Blain Pais, MD [facility]    02/19/2019, 6:08 PM

## 2019-02-19 NOTE — Progress Notes (Addendum)
Pharmacy Antibiotic Note  Joan Mosley is a 74 y.o. female admitted on 02/09/2019.  Pharmacy has been consulted for aztreonam dosing for aspiration pneumonia given patient has a PCN allery. Patient's PCN allergy is listed as rash. Per [facility] patient has never received cephalosporins. When I called the patient's room to discuss her allergy she did not answer the phone. Of note, patient is ESRD on HD TTS.   Patient was started on aztreonam 2g IV x1 (at 0100 on 1/24) and aztreonam 1g IV q8h (at 0700 on 1/24). Plan for HD next tomorrow morning. WBC wnl. Afebrile.   Plan: Adjust aztreonam 500mg  IV q12h due to patient being ESRD on HD (first dose to be administered tomorrow morning due to higher doses today) Monitor cultures/sensitivites, and clinical progression   Height: 5\' 1"  (154.9 cm) Weight: 194 lb 0.1 oz (88 kg) IBW/kg (Calculated) : 47.8  Temp (24hrs), Avg:98.4 F (36.9 C), Min:97.8 F (36.6 C), Max:99.2 F (37.3 C)  Recent Labs  Lab 02/13/19 0157 02/14/19 0221 02/15/19 0625 02/16/19 0541 02/17/19 0504  WBC 5.5 7.3  --   --   --   CREATININE 5.91* 7.29* 4.69* 6.48* 4.06*    Estimated Creatinine Clearance: 12.1 mL/min (A) (by C-G formula based on SCr of 4.06 mg/dL (H)).    Allergies  Allergen Reactions  . Avelox [Moxifloxacin Hcl In Nacl] Other (See Comments)    Unknown reaction  . Codeine Anaphylaxis  . Penicillins Rash    Did it involve swelling of the face/tongue/throat, SOB, or low BP? Unk Did it involve sudden or severe rash/hives, skin peeling, or any reaction on the inside of your mouth or nose? Yes Did you need to seek medical attention at a hospital or doctor's office? Unk When did it last happen? Unk If all above answers are "NO", may proceed with cephalosporin use.   . Sulfa Antibiotics Other (See Comments)    Unknown reaction  . Dextromethorphan-Guaifenesin Other (See Comments)    Reported by [facility] - unknown reaction. Pt states she is not allergic to  dextromethorphan-guaifenesin.  . Shellfish Allergy Hives, Itching and Rash   Antimicrobials this Admission:  Aztreonam 1/24 >>  Metronidazole 1/24 >>   Antimicrobial Adjustments this Admission: 1/24: aztreonam adjusted from 1g IV q8h to 500mg  IV q12h due to ESRD   Cultures this Admission:  1/23 Bcx: no growth <12h 1/16 MRSA PCR: negative    Thank you for allowing pharmacy to be a part of this patient's care.  Henri Medal 02/19/2019 11:25 AM

## 2019-02-19 NOTE — Evaluation (Signed)
Clinical/Bedside Swallow Evaluation Patient Details  Name: Joan Mosley MRN: [id number] Date of Birth: 08-Nov-1945  Today's Date: 02/19/2019 Time: SLP Start Time (ACUTE ONLY): 13 SLP Stop Time (ACUTE ONLY): 1110 SLP Time Calculation (min) (ACUTE ONLY): 20 min  Past Medical History:  Past Medical History:  Diagnosis Date  . Arthritis   . COPD (chronic obstructive pulmonary disease) ([place])   . Diabetes mellitus without complication ([place])   . Diabetic neuropathy ([place])   . 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 Renal disorder    ESRD  . Thyroid disease   . TIA (transient ischemic attack)    Past Surgical History:  Past Surgical History:  Procedure Laterality Date  . ABDOMINAL HYSTERECTOMY    . IR FLUORO GUIDE CV LINE RIGHT  11/14/2018  . RECTOCELE REPAIR    . REPLACEMENT TOTAL KNEE BILATERAL Bilateral 2008  . THYROIDECTOMY     80%  . VAGINAL WOUND CLOSURE / REPAIR     HPI:  74 year old female with past medical history significant for ESRD and COPD on 5 L home O2 who was admitted 1/15 with Covid-19 pneumonia.  She has been treated with remdesivir and Decadron. 1/23 with increasing 02 requirements. 1/23 CT chest>>"no PE, near complete collapse of both the right middle and right lower lobes with extensive endobronchial debris. Consolidation involving the right upper lobe concerning for pneumonia or aspiration pneumonia." Speech consulted for swallowing given concerns for aspiration.  Pt is known to SLP services; has had swallow assessments during admissions in October and November.  MBS on 11/30 revealed mild pharyngeal dysphagia with frank penetration of thin liquids to the vocal folds; chin tuck assisted in airway protection.    Assessment / Plan / Recommendation Clinical Impression  Pt presents with baseline cough,which confounds clinical assessment.  However, she presents with adequate mastication, fairly consistent coughing after serial swallows of thin liquids, concerning  for aspiration. No coughing noted with nectars and there appears to be functional consumption of solids.  02 demands remain high; RR well within parameters for safe coordination between swallowing and breathing, and pt was noted to immediately exhale consistently after swallowing events.  Given recent hx of pnas, results from last MBS, and recurring s/s of aspiration with thins, recommend changing diet to dysphagia 3/nectars for today; f/u with MBS in the next 24-48 hours.  D/W RN and pt.  SLP Visit Diagnosis: Dysphagia, unspecified (R13.10)    Aspiration Risk  Mild aspiration risk    Diet Recommendation   dys3/ nectar  Medication Administration: Whole meds with puree    Other  Recommendations Oral Care Recommendations: Oral care BID Other Recommendations: Order thickener from pharmacy   Follow up Recommendations Other (comment)(tba)      Frequency and Duration            Prognosis        Swallow Study   General HPI: 74 year old female with past medical history significant for ESRD and COPD on 5 L home O2 who was admitted 1/15 with Covid-19 pneumonia.  She has been treated with remdesivir and Decadron. 1/23 with increasing 02 requirements. 1/23 CT chest>>"no PE, near complete collapse of both the right middle and right lower lobes with extensive endobronchial debris. Consolidation involving the right upper lobe concerning for pneumonia or aspiration pneumonia." Speech consulted for swallowing given concerns for aspiration.  Pt is known to SLP services; has had swallow assessments during admissions in October and November.  MBS on 11/30 revealed mild pharyngeal dysphagia  with frank penetration of thin liquids to the vocal folds; chin tuck assisted in airway protection.  Type of Study: Bedside Swallow Evaluation Previous Swallow Assessment: see HPI Diet Prior to this Study: NPO Temperature Spikes Noted: No Respiratory Status: Nasal cannula(15 liters) History of Recent Intubation:  No Behavior/Cognition: Alert;Cooperative Oral Cavity Assessment: Within Functional Limits Oral Care Completed by SLP: No Oral Cavity - Dentition: Missing dentition Vision: Functional for self-feeding Self-Feeding Abilities: Needs assist Patient Positioning: Upright in bed Baseline Vocal Quality: Normal Volitional Cough: Strong Volitional Swallow: Able to elicit    Oral/Motor/Sensory Function Overall Oral Motor/Sensory Function: Within functional limits   Ice Chips Ice chips: Within functional limits   Thin Liquid Thin Liquid: Impaired Presentation: Cup;Self Fed Pharyngeal  Phase Impairments: Cough - Delayed    Nectar Thick Nectar Thick Liquid: Within functional limits Presentation: Cup   Honey Thick Honey Thick Liquid: Not tested   Puree Puree: Within functional limits   Solid     Solid: Within functional limits      Joan Mosley 02/19/2019,11:47 AM   [name], [place] Office number [phone] Pager [phone]

## 2019-02-19 NOTE — Progress Notes (Signed)
[facility] Progress Note   Home meds: - furosemide 80 qd/ midodrine 10 TTS pre hd - sevelamer carb ac tid - clonazepam 0.25 am and 0.50 pm/ fluoxetine 10 qd - rosuvastatin 40 hs - montelukast 10 hs/ levalbuterol tid prn/ home O2 3L - levothyroxine 112 ug - insulin detemir 15 qd/ lispro SSI tid   Outpt HD:TTS G-O  4h 400/800 94kg 2/2 bath TDC R - sensipar 60 po - venofer 50 /wk - mircera 75 ug q 2wk, last 1/14 - calc 1.75 ug tiw  Assessment/ Plan:   1. COVID + PNA - tested + on 1/12, will need to quarantine until 02/27/19. Getting IV steroids / remdesivir and IV vanc / aztreonam(stopped 1/17)for poss CAP 2. ESRD - on HD TTS at COVID shift. Will need new EDW when d/c  1/19 HD net UF 2.3, 1/21 2L  Will HD tomorrow AM 1st shift.  3. Hypotension/ volume - chronic hypotension on midodrine, 4-6kg below EDW in hospital. Midodriine had to be changedfrom pre HDto 10mg  bid daily.  4. DM 2 - on insulin 5. Anemia ckd - had esa1/14 (next due 1/28) 6. Renal osteodystrophy -ordered aphos for 1/20-> 4.8 (within goal) 7. COPD /chron resp failure - at home she req O2 5L 8. Pall Care - pt is enrolled in a community-based Palliative Care program at home. Consider PC consult for [place].  Subjective:   NPO as she was having a difficult time protecting her airway, coughing when attempting PO. On high flow Mathews sats 90%.   Objective:   BP (!) 119/57 (BP Location: Left Arm)   Pulse 83   Temp 97.8 F (36.6 C) (Oral)   Resp (!) 22   Ht 5\' 1"  (1.549 m)   Wt 88 kg   SpO2 90%   BMI 36.66 kg/m   Intake/Output Summary (Last 24 hours) at 02/19/2019 0741 Last data filed at 02/19/2019 0347 Gross per 24 hour  Intake 491.07 ml  Output --  Net 491.07 ml   Weight change:   Physical Exam: Gen pleasant, breathing fairly comfortably today No jvd or bruits Chest clear bilatbilat, distant BS RRR no MRG Abd soft ntnd no mass or ascites  +bsobese Extchronic 1+ distal pretib bilatedema, tender Neuro isawake but hard of hearing,nonfocal Access RIJ TC  Imaging: CT ANGIO CHEST PE W OR WO CONTRAST  Result Date: 02/18/2019 CLINICAL DATA:  Dyspnea.  Cardiac arrhythmia.  PE.  COVID positive. EXAM: CT ANGIOGRAPHY CHEST WITH CONTRAST TECHNIQUE: Multidetector CT imaging of the chest was performed using the standard protocol during bolus administration of intravenous contrast. Multiplanar CT image reconstructions and MIPs were obtained to evaluate the vascular anatomy. CONTRAST:  147mL OMNIPAQUE IOHEXOL 350 MG/ML SOLN COMPARISON:  11/16/2018 FINDINGS: Cardiovascular: Contrast injection is sufficient to demonstrate satisfactory opacification of the pulmonary arteries to the segmental level. There is no pulmonary embolus. The main pulmonary artery is significantly dilated measuring up to approximately 4 cm in diameter. The main right pulmonary artery is dilated measuring approximately 4 cm in diameter. The main left pulmonary artery is dilated measuring approximately 3.4 cm in diameter. There is no CT evidence of acute right heart strain. There are atherosclerotic changes of the visualized thoracic aorta without evidence for thoracic aortic aneurysm. Heart size is enlarged. Advanced coronary artery calcifications are noted. Mediastinum/Nodes: --mild mediastinal adenopathy is noted, presumably reactive. --No axillary lymphadenopathy. --No supraclavicular lymphadenopathy. --Normal thyroid gland. --The esophagus is unremarkable Lungs/Pleura: Emphysematous changes are noted. There is near complete collapse of  the left lower lobe with extensive debris within bronchus intermedius and the right lower lobe bronchus. There is near complete collapse of the right middle lobe. Airspace consolidation is noted in the right upper lobe. There is a small right-sided pleural effusion. Upper Abdomen: No acute abnormality. Musculoskeletal: There is no acute displaced  fracture. There are old bilateral rib fractures. Review of the MIP images confirms the above findings. IMPRESSION: 1. No acute pulmonary embolism. 2. Near complete collapse of both the right middle and right lower lobes with extensive endobronchial debris within bronchus intermedius. Findings could be secondary to aspiration in the appropriate clinical setting. 3. Consolidation involving the right upper lobe concerning for pneumonia or aspiration pneumonia. 4. Small right-sided pleural effusion. 5. Significantly dilated pulmonary arteries which can be seen in patients with pulmonary artery hypertension. 6. Cardiomegaly.  Coronary artery disease is noted. Aortic Atherosclerosis (ICD10-I70.0) and Emphysema (ICD10-J43.9). Electronically Signed   By: Constance Holster M.D.   On: 02/18/2019 19:32   DG CHEST PORT 1 VIEW  Result Date: 02/17/2019 CLINICAL DATA:  COVID-19 positive patient.  Dyspnea. EXAM: PORTABLE CHEST 1 VIEW COMPARISON:  Single-view of the chest 02/12/2019 and 02/09/2019. FINDINGS: Moderate right pleural effusion and basilar airspace disease have worsened. The left lung remains clear. Cardiac silhouette is largely obscured. No pneumothorax. Dialysis catheter noted. IMPRESSION: Moderate right pleural effusion and basilar airspace disease have worsened. No other change. Electronically Signed   By: Inge Rise M.D.   On: 02/17/2019 12:55    Labs: BMET Recent Labs  Lab 02/13/19 0157 02/14/19 0221 02/15/19 0625 02/16/19 0541 02/17/19 0504  NA 136 135 140 138 134*  K 4.2 4.5 3.9 5.6* 3.6  CL 96* 96* 101 101 93*  CO2 24 23 24 22 26   GLUCOSE 77 88 77 76 82  BUN 38* 51* 27* 44* 23  CREATININE 5.91* 7.29* 4.69* 6.48* 4.06*  CALCIUM 8.3* 8.4* 8.8* 8.9 8.8*  PHOS  --   --  4.8*  --  4.8*   CBC Recent Labs  Lab 02/13/19 0157 02/14/19 0221  WBC 5.5 7.3  NEUTROABS 3.1 4.7  HGB 9.3* 9.4*  HCT 29.9* 30.2*  MCV 94.6 93.5  PLT 181 189    Medications:    . vitamin C  500 mg Oral  Daily  . budesonide (PULMICORT) nebulizer solution  0.25 mg Nebulization BID  . calcitRIOL  1.75 mcg Oral Q T,Th,Sa-HD  . Chlorhexidine Gluconate Cloth  6 each Topical Q0600  . clonazePAM  0.25 mg Oral Daily  . clonazepam  0.25 mg Oral QHS  . docusate sodium  100 mg Oral q morning - 10a  . feeding supplement (NEPRO CARB STEADY)  237 mL Oral BID BM  . FLUoxetine  10 mg Oral Daily  . guaiFENesin  600 mg Oral BID  . heparin  7,500 Units Subcutaneous Q8H  . insulin aspart  0-5 Units Subcutaneous QHS  . insulin aspart  0-6 Units Subcutaneous TID WC  . Ipratropium-Albuterol  2 puff Inhalation TID  . levothyroxine  112 mcg Oral QAC breakfast  . midodrine  10 mg Oral BID  . montelukast  10 mg Oral QHS  . multivitamin  1 tablet Oral QHS  . mupirocin ointment   Topical Daily  . rosuvastatin  40 mg Oral QHS  . senna  1 tablet Oral q morning - 10a  . sevelamer carbonate  1,600 mg Oral TID WC  . sodium chloride  2 spray Each Nare TID  . sodium chloride  flush  3 mL Intravenous Q12H  . zinc sulfate  220 mg Oral Daily      Otelia Santee, MD 02/19/2019, 7:41 AM

## 2019-02-19 NOTE — Progress Notes (Signed)
Bilateral lower extremity venous duplex completed. Refer to "CV Proc" under chart review to view preliminary results.  02/19/2019 3:12 PM Kelby Aline., MHA, RVT, RDCS, RDMS

## 2019-02-20 LAB — RENAL FUNCTION PANEL
Albumin: 3.1 g/dL — ABNORMAL LOW (ref 3.5–5.0)
Anion gap: 16 — ABNORMAL HIGH (ref 5–15)
BUN: 31 mg/dL — ABNORMAL HIGH (ref 8–23)
CO2: 26 mmol/L (ref 22–32)
Calcium: 9.2 mg/dL (ref 8.9–10.3)
Chloride: 97 mmol/L — ABNORMAL LOW (ref 98–111)
Creatinine, Ser: 5.02 mg/dL — ABNORMAL HIGH (ref 0.44–1.00)
GFR calc Af Amer: 9 mL/min — ABNORMAL LOW (ref >60)
GFR calc non Af Amer: 8 mL/min — ABNORMAL LOW (ref >60)
Glucose, Bld: 86 mg/dL (ref 70–99)
Phosphorus: 5.3 mg/dL — ABNORMAL HIGH (ref 2.5–4.6)
Potassium: 4 mmol/L (ref 3.5–5.1)
Sodium: 139 mmol/L (ref 135–145)

## 2019-02-20 LAB — GLUCOSE, CAPILLARY
Glucose-Capillary: 66 mg/dL — ABNORMAL LOW (ref 70–99)
Glucose-Capillary: 180 mg/dL — ABNORMAL HIGH (ref 70–99)
Glucose-Capillary: 227 mg/dL — ABNORMAL HIGH (ref 70–99)
Glucose-Capillary: 72 mg/dL (ref 70–99)

## 2019-02-20 LAB — CBC WITH DIFFERENTIAL/PLATELET
Abs Immature Granulocytes: 0.25 10*3/uL — ABNORMAL HIGH (ref 0.00–0.07)
Basophils Absolute: 0 10*3/uL (ref 0.0–0.1)
Basophils Relative: 0 %
Eosinophils Absolute: 0.1 10*3/uL (ref 0.0–0.5)
Eosinophils Relative: 1 %
HCT: 32 % — ABNORMAL LOW (ref 36.0–46.0)
Hemoglobin: 9.6 g/dL — ABNORMAL LOW (ref 12.0–15.0)
Immature Granulocytes: 2 %
Lymphocytes Relative: 20 %
Lymphs Abs: 2.2 10*3/uL (ref 0.7–4.0)
MCH: 28.9 pg (ref 26.0–34.0)
MCHC: 30 g/dL (ref 30.0–36.0)
MCV: 96.4 fL (ref 80.0–100.0)
Monocytes Absolute: 0.8 10*3/uL (ref 0.1–1.0)
Monocytes Relative: 7 %
Neutro Abs: 7.7 10*3/uL (ref 1.7–7.7)
Neutrophils Relative %: 70 %
Platelets: 173 10*3/uL (ref 150–400)
RBC: 3.32 MIL/uL — ABNORMAL LOW (ref 3.87–5.11)
RDW: 18.1 % — ABNORMAL HIGH (ref 11.5–15.5)
WBC: 11.1 10*3/uL — ABNORMAL HIGH (ref 4.0–10.5)
nRBC: 0 % (ref 0.0–0.2)

## 2019-02-20 LAB — D-DIMER, QUANTITATIVE: D-Dimer, Quant: 1.1 ug/mL-FEU — ABNORMAL HIGH (ref 0.00–0.50)

## 2019-02-20 MED ORDER — CALCITRIOL 0.5 MCG PO CAPS
ORAL_CAPSULE | ORAL | Status: AC
  Filled 2019-02-20: qty 3

## 2019-02-20 MED ORDER — HEPARIN SODIUM (PORCINE) 1000 UNIT/ML IJ SOLN
INTRAMUSCULAR | Status: AC
  Administered 2019-02-20: 10:00:00 1000 [IU]
  Filled 2019-02-20: qty 4

## 2019-02-20 MED ORDER — CALCITRIOL 0.25 MCG PO CAPS
ORAL_CAPSULE | ORAL | Status: AC
  Filled 2019-02-20: qty 1

## 2019-02-20 MED ORDER — DEXTROSE 50 % IV SOLN
INTRAVENOUS | Status: AC
  Administered 2019-02-20: 14:00:00 50 mL
  Filled 2019-02-20: qty 50

## 2019-02-20 NOTE — Progress Notes (Signed)
  Speech Language Pathology Treatment: Dysphagia  Patient Details Name: Joan Mosley MRN: [id number] DOB: 1945/06/08 Today's Date: 02/20/2019 Time: [id number] SLP Time Calculation (min) (ACUTE ONLY): 12 min  Assessment / Plan / Recommendation Clinical Impression  Pt with increased 02 needs today - now on 10L HFNC and 15L NRB.  Pt moaning in bed; stops to answer questions, but states "I don't feel good." Remains confused, asking to go home and with poor insight into her situation.  Offered encouragement.  Pt accepted several ounces of nectar-thick liquid with no overt s/s of aspiration, no change in SP02 nor RR. She declined any further POs due to not feeling well. Reviewed importance of taking frequent rest breaks during meals and updated sign over bed.   Continue current diet cautiously - hold tray if pt coughing or if MS doesn't allow safe eating.  SLP will follow for safety and readiness for MBS when appropriate.   HPI HPI: 74 year old female with past medical history significant for ESRD and COPD on 5 L home O2 who was admitted 1/15 with Covid-19 pneumonia.  She has been treated with remdesivir and Decadron. 1/23 with increasing 02 requirements. 1/23 CT chest>>"no PE, near complete collapse of both the right middle and right lower lobes with extensive endobronchial debris. Consolidation involving the right upper lobe concerning for pneumonia or aspiration pneumonia." Speech consulted for swallowing given concerns for aspiration.  Pt is known to SLP services; has had swallow assessments during admissions in October and November.  MBS on 11/30 revealed mild pharyngeal dysphagia with frank penetration of thin liquids to the vocal folds; chin tuck assisted in airway protection.       SLP Plan  Continue with current plan of care       Recommendations  Diet recommendations: Dysphagia 3 (mechanical soft);Nectar-thick liquid Liquids provided via: Cup;Straw Medication Administration: Whole meds  with puree Supervision: Staff to assist with self feeding Compensations: Minimize environmental distractions;Other (Comment)(rest breaks) Postural Changes and/or Swallow Maneuvers: Seated upright 90 degrees                Oral Care Recommendations: Oral care BID Follow up Recommendations: Other (comment)(tba) SLP Visit Diagnosis: Dysphagia, oropharyngeal phase (R13.12) Plan: Continue with current plan of care             [name] 02/20/2019, 3:44 PM  [name], [place] Office number [phone] Pager [phone]

## 2019-02-20 NOTE — Progress Notes (Signed)
Occupational Therapy Treatment Patient Details Name: Joan Mosley MRN: [id number] DOB: 07-23-1945 Today's Date: 02/20/2019    History of present illness This 74 y.o. female admitted from HD 02/09/2019 center due to hypoxia.  Pt tested + for COVID on 02/07/2019.  Dx: COVID related PNA.  PMH includes: DM  , ESRD, COPD, Hypothyroidism   OT comments  Pt making gradual progress towards OT goals this session. Session focus on bed mobility and sitting balance as precursor to higher level ADLs. Overall, pt required MOD- MAX A +2 to transition from supine<>sit. Pt able to sit EOB ~ 10 minutes with min guard assist for safety. Patient with increased RR (upper 20s to 30s) as well as increased HR increasing to 120 bpm and then jumping to as much as 130-140 bpm. Pt adamant about returing home although feel pt more appropriate for SNF currently. Will follow acutely for OT needs.    Follow Up Recommendations  SNF;Supervision/Assistance - 24 hour    Equipment Recommendations  Hospital bed;Other (comment)(hoyer lift)    Recommendations for Other Services      Precautions / Restrictions Precautions Precautions: Fall Precaution Comments: O2 dependent at baseline Restrictions Weight Bearing Restrictions: No       Mobility Bed Mobility Overal bed mobility: Needs Assistance Bed Mobility: Supine to Sit;Sit to Supine     Supine to sit: Mod assist;+2 for physical assistance;+2 for safety/equipment;HOB elevated Sit to supine: Max assist;+2 for safety/equipment;+2 for physical assistance   General bed mobility comments: assist with repositioning in bed, +2 max assist for sit>supine. pt was able to initiate reaching for rails during supine >sit but overall required MOD A +2  Transfers                 General transfer comment: unable due to elevated HR (up to 145 irreg) and decr resp status    Balance Overall balance assessment: Needs assistance Sitting-balance support: Single extremity  supported;Feet unsupported Sitting balance-Leahy Scale: Poor Sitting balance - Comments: required UE support                                   ADL either performed or assessed with clinical judgement   ADL Overall ADL's : Needs assistance/impaired                         Toilet Transfer: Maximal assistance;+2 for physical assistance Toilet Transfer Details (indicate cue type and reason): MAX A +2 to roll to position on bed pan         Functional mobility during ADLs: Maximal assistance;+2 for physical assistance General ADL Comments: pt limited by generalized weakness, decreased activity tolerance and dyspnea     Vision       Perception     Praxis      Cognition Arousal/Alertness: Awake/alert Behavior During Therapy: Flat affect Overall Cognitive Status: No family/caregiver present to determine baseline cognitive functioning                                 General Comments: pt slow to process likely d/t HOH. Pt with poor judgement/ safety awareness as noted by pt constantly stating that she needs a w/c so that she can go home        Exercises     Shoulder Instructions       General Comments  Pt on 10L HFNC with NRB (15L) with sats generally in mid90s, however with movement would get a lot of artifact and unclear how low her sats were (?85%, but not good pleth). Her RR was 30s    Pertinent Vitals/ Pain       Pain Assessment: Faces Faces Pain Scale: Hurts little more Pain Location: general discomfort with movement Pain Descriptors / Indicators: Grimacing;Discomfort;Moaning Pain Intervention(s): Monitored during session;Repositioned;Limited activity within patient's tolerance  Home Living         Prior Functioning/Environment       Comments: Pt reports she spends her time in and sleeps in her recliner.  her children assist with ADLs and transfers to [facility] and w/c.    Frequency  Min 2X/week        Progress Toward  Goals  OT Goals(current goals can now be found in the care plan section)  Progress towards OT goals: Progressing toward goals  Acute Rehab OT Goals Patient Stated Goal: feel better OT Goal Formulation: With patient Time For Goal Achievement: 02/28/19 Potential to Achieve Goals: Good  Plan Discharge plan remains appropriate    Co-evaluation      Reason for Co-Treatment: Complexity of the patient's impairments (multi-system involvement);For patient/therapist safety PT goals addressed during session: Mobility/safety with mobility;Balance        AM-PAC OT "6 Clicks" Daily Activity     Outcome Measure   Help from another person eating meals?: A Little Help from another person taking care of personal grooming?: A Lot Help from another person toileting, which includes using toliet, bedpan, or urinal?: Total Help from another person bathing (including washing, rinsing, drying)?: Total Help from another person to put on and taking off regular upper body clothing?: Total Help from another person to put on and taking off regular lower body clothing?: Total 6 Click Score: 9    End of Session Equipment Utilized During Treatment: Oxygen  OT Visit Diagnosis: Unsteadiness on feet (R26.81)   Activity Tolerance Patient limited by fatigue   Patient Left in bed;with call bell/phone within reach;with bed alarm set   Nurse Communication Mobility status        Time: [id number] OT Time Calculation (min): 30 min  Charges: OT General Charges $OT Visit: 1 Visit OT Treatments $Therapeutic Activity: 8-22 mins  Lanier Clam., COTA/L Acute Rehabilitation Services [phone]    Ihor Gully 02/20/2019, 4:35 PM

## 2019-02-20 NOTE — Care Management (Signed)
CM spoke with both [facility] and [facility].  [facility] is declining to offer a bed, [facility] does not have a bed but will place pt on waiting list.  Pt's oxygen needs are currently to high for a SNF to manage.  Pt oxygen needs are too high to be considered stable for home discharge.

## 2019-02-20 NOTE — Progress Notes (Signed)
PROGRESS NOTE    Joan Mosley  [id number] DOB: 1945/03/05 DOA: 02/09/2019 PCP: Flossie Buffy, NP    Brief Narrative: 74 year old lady with prior history of end-stage renal disease on dialysis, COPD,, chronic respiratory failure on 5 L of home oxygen, type 2 diabetes mellitus, hypothyroidism, TIA presented to ED from dialysis for evaluation of hypoxia.  Patient was tested positive for COVID-19 on 02/07/2019 and 20 minutes into her dialysis she was noted to be hypoxic and was brought to ED for further evaluation.  On arrival to ED patient was saturating 88% on nonrebreather and she was switched to 20 L of high flow oxygen and sats have been around 92%.  Chest x-ray showed asymmetric pulmonary opacities compatible with COVID-19 pneumonia mildly progressed in the lower lungs compared to prior chest x-ray from 2 days ago. She is now admitted for further evaluation and management on acute on chronic respiratory failure with hypoxia secondary to COVID-19 multifocal pneumonia. Nephrology consulted for dialysis. She underwent HD and her breathing has improved. We were able to wean her down to 9 lit of HF oxygen and her sats have remained well above 95%.  Meanwhile palliative care consulted for [place], plan for outpatient palliative care follow up .  Of note patient had missed one HD session and missed more than a week of her synthroid tablets while waiting for her scripts to be filled. Recommend rechecking thyroid panel in 3 to 4 weeks post discharge and adjusting medications as needed.  Pt still on 10-15 lit of HF oxygen.  Had decompensation on 1/23 evening.    Assessment & Plan:   Principal Problem:   Pneumonia due to COVID-19 virus Active Problems:   Type 2 diabetes mellitus with diabetic polyneuropathy, with long-term current use of insulin (HCC)   ESRD (end stage renal disease) (HCC)   Acute on chronic respiratory failure with hypoxia (HCC)   COPD (chronic obstructive pulmonary disease)  (HCC)   COVID-19   Aspiration into airway   Acute on chronic respiratory failure with hypoxia secondary to COVID-19 multifocal pneumonia Patient tested positive for Covid on 02/07/2019.  She initially required up to 20 L of high flow nasal cannula oxygen but after dialysis was weaned down to 10 L but then up to 15 L/min again of high flow oxygen.  PT evaluation ordered recommending SNF but her son and HPOA not wanting this. She has been to [facility] before and her son is open for her placement there (she has been at [facility] before). CM consult for LTACH placement.   -continue with incentive spirometry and flutter valve--patient needs guidance and encouragement to use these devices and has issues with expectoration. -Repeat CXR reviewed with Radiologist, indicating atelectasis, small effusion not amenable for thoracentesis.  -Requested chest physiotherapy as tolerated.  -Wean off O2 supplementation as tolerated, O2 sats have fluctuated, O2 sats remain stable on 12-15L/min O2 supplementation--which could not be provided per her home equipment.  -Possible aspiration on 1/23. Continue Abx peer PCCM. Continue aspiration precautions. SLP has evaluated,patient now on dysphagia 3 diet with assistance.    Left knee pain.  Noted on 1/22, Venous Doppler US ordered, completed on 1/24, showed no DVT.   CTA PE protocol ordered on 1/23 due to worsening respiratory status, showed no PE.     LDH is within normal limits CRP is elevated, pro calcitonin less than 0.5, WBC count within normal limits, MRSA PCR is negative. Chest x-ray on admission showed asymmetric pulmonary opacity compatible with COVID-19 pneumonia  Mild progression in both lower lungs since 02/07/2019. Completed IV Decadron 6 mg daily Completed IV remdesivir She will need to quarantine till 02/27/19. She remains afebrile and wbc count within normal limits.    Hypothyroidism Continue with Synthroid 112 MCG daily. TSH abnormal and high at 14.  As  per the son patient missed her Synthroid for more than a week while waiting for her prescription to be filled out.  Recommend outpatient follow-up with PCP for repeat thyroid panel in about 3 weeks.   End-stage renal disease on dialysis Tuesday Thursday and Saturday. Volume management as per dialysis Nephrology following, HD per their recommendations.   Insulin-dependent diabetes mellitus with polyneuropathy and chronic kidney disease CBG (last 3)  Recent Labs    02/20/19 1135 02/20/19 1435 02/20/19 1533  GLUCAP 66* 180* 227*   Discontinued lantus as her CBG were in the 60's in the am and after dialysis.  Continue with SSI. Last A1c is 5.6.  Was on D5 infusion briefly on 1/24 due to NPO status.     Body mass index is 37.57 kg/m. Morbidly obese DVT prophylaxis heparin  7500 TID.     History of COPD, advanced, on 5L/min via [place] of O2 supplementation at baseline.  No wheezing heard Continue with bronchodilators as needed. Continue Mucinex   Pressure injury on admission.   Pressure Injury 02/13/19 Heel Left Deep Tissue Pressure Injury - Purple or maroon localized area of discolored intact skin or blood-filled blister due to damage of underlying soft tissue from pressure and/or shear. (Active)  02/13/19 1000  Location: Heel  Location Orientation: Left  Staging: Deep Tissue Pressure Injury - Purple or maroon localized area of discolored intact skin or blood-filled blister due to damage of underlying soft tissue from pressure and/or shear.  Wound Description (Comments):   Present on Admission:    Wound care consulted.       DVT prophylaxis: Heparin Code Status: Full code. Her son, who is a paramedic, is her HPOA, understands that the patient may not recover from prolonged intubation but, along with the patient, would like for the patient to remain Full code. There have been several in-depth discussions on code status.  Family Communication: Discussed with the patient.    Disposition Plan: pending improvement in hypoxia, will likely need LTACH placement.  Consultants:   Nephrology  Palliative care consulted  Wound care.   Procedures: None Antimicrobials: None  Subjective: She is more engaged today, more interactive. She was tired thought after dialysis and reported not being able to cough as well. Bedside nurse assisting patient with deep breathing/ecpectoration. Patient appears to have a pattern of become more fatigued in the afternoons and requiring more O2 supplementation and assistance with expectoration.   Objective: Vitals:   02/20/19 1043 02/20/19 1200 02/20/19 1411 02/20/19 1536  BP: (!) 134/53   129/76  Pulse: 80  (!) 106 (!) 113  Resp: 19  (!) 22 (!) 22  Temp: 98 F (36.7 C) 99.2 F (37.3 C)  98.7 F (37.1 C)  TempSrc: Oral Oral  Axillary  SpO2: 100% 96% 95% 91%  Weight: 90.2 kg     Height:        Intake/Output Summary (Last 24 hours) at 02/20/2019 1659 Last data filed at 02/20/2019 1043 Gross per 24 hour  Intake 100 ml  Output 1516 ml  Net -1416 ml   Filed Weights   02/20/19 0532 02/20/19 0729 02/20/19 1043  Weight: 92.1 kg 92.2 kg 90.2 kg  Examination:  General exam: Resting in bed,tired appearing Respiratory system:  shallow breaths, no wheezing.  Cardiovascular system: S1S2 present, RRR.  Gastrointestinal system: Abdomen is soft, non tender non distended Central nervous system: arousable,  oriented, able to answer all questions appropriately, follows commands. No facial droop.   Extremities: no cyanosis or clubbing.  Skin: left heel deep injury.  Psychiatry: mood is appropriate.     Data Reviewed: I have personally reviewed following labs and imaging studies  CBC: Recent Labs  Lab 02/14/19 0221 02/20/19 0613  WBC 7.3 11.1*  NEUTROABS 4.7 7.7  HGB 9.4* 9.6*  HCT 30.2* 32.0*  MCV 93.5 96.4  PLT 189 [id number]   Basic Metabolic Panel: Recent Labs  Lab 02/14/19 0221 02/15/19 0625 02/16/19 0541  02/17/19 0504 02/20/19 0613  NA 135 140 138 134* 139  K 4.5 3.9 5.6* 3.6 4.0  CL 96* 101 101 93* 97*  CO2 23 24 22 26 26   GLUCOSE 88 77 76 82 86  BUN 51* 27* 44* 23 31*  CREATININE 7.29* 4.69* 6.48* 4.06* 5.02*  CALCIUM 8.4* 8.8* 8.9 8.8* 9.2  PHOS  --  4.8*  --  4.8* 5.3*   GFR: Estimated Creatinine Clearance: 9.9 mL/min (A) (by C-G formula based on SCr of 5.02 mg/dL (H)). Liver Function Tests: Recent Labs  Lab 02/17/19 0504 02/20/19 0613  ALBUMIN 3.0* 3.1*   No results for input(s): LIPASE, AMYLASE in the last 168 hours. No results for input(s): AMMONIA in the last 168 hours. Coagulation Profile: No results for input(s): INR, PROTIME in the last 168 hours. Cardiac Enzymes: No results for input(s): CKTOTAL, CKMB, CKMBINDEX, TROPONINI in the last 168 hours. BNP (last 3 results) No results for input(s): PROBNP in the last 8760 hours. HbA1C: No results for input(s): HGBA1C in the last 72 hours. CBG: Recent Labs  Lab 02/19/19 1644 02/19/19 2011 02/20/19 1135 02/20/19 1435 02/20/19 1533  GLUCAP 112* 119* 66* 180* 227*   Lipid Profile: No results for input(s): CHOL, HDL, LDLCALC, TRIG, CHOLHDL, LDLDIRECT in the last 72 hours. Thyroid Function Tests: No results for input(s): TSH, T4TOTAL, FREET4, T3FREE, THYROIDAB in the last 72 hours. Anemia Panel: No results for input(s): VITAMINB12, FOLATE, FERRITIN, TIBC, IRON, RETICCTPCT in the last 72 hours. Sepsis Labs: No results for input(s): PROCALCITON, LATICACIDVEN in the last 168 hours.  Recent Results (from the past 240 hour(s))  MRSA PCR Screening     Status: None   Collection Time: 02/11/19  1:21 AM   Specimen: Nasal Mucosa; Nasopharyngeal  Result Value Ref Range Status   MRSA by PCR NEGATIVE NEGATIVE Final    Comment:        The GeneXpert MRSA Assay (FDA approved for NASAL specimens only), is one component of a comprehensive MRSA colonization surveillance program. It is not intended to diagnose MRSA infection  nor to guide or monitor treatment for MRSA infections. Performed at [facility], [place] [address], [place], [place] [address]   Culture, blood (routine x 2)     Status: None (Preliminary result)   Collection Time: 02/18/19 10:14 PM   Specimen: BLOOD RIGHT HAND  Result Value Ref Range Status   Specimen Description BLOOD RIGHT HAND  Final   Special Requests   Final    BOTTLES DRAWN AEROBIC AND ANAEROBIC Blood Culture adequate volume   Culture   Final    NO GROWTH 2 DAYS Performed at [facility], [place] [address], [place], [place] [address]    Report  Status PENDING  Incomplete  Culture, blood (routine x 2)     Status: None (Preliminary result)   Collection Time: 02/18/19 10:20 PM   Specimen: BLOOD LEFT HAND  Result Value Ref Range Status   Specimen Description BLOOD LEFT HAND  Final   Special Requests   Final    BOTTLES DRAWN AEROBIC ONLY Blood Culture results may not be optimal due to an inadequate volume of blood received in culture bottles   Culture   Final    NO GROWTH 2 DAYS Performed at [facility], [place] [address], [place], [place] [address]    Report Status PENDING  Incomplete         Radiology Studies: CT ANGIO CHEST PE W OR WO CONTRAST  Result Date: 02/18/2019 CLINICAL DATA:  Dyspnea.  Cardiac arrhythmia.  PE.  COVID positive. EXAM: CT ANGIOGRAPHY CHEST WITH CONTRAST TECHNIQUE: Multidetector CT imaging of the chest was performed using the standard protocol during bolus administration of intravenous contrast. Multiplanar CT image reconstructions and MIPs were obtained to evaluate the vascular anatomy. CONTRAST:  172mL OMNIPAQUE IOHEXOL 350 MG/ML SOLN COMPARISON:  11/16/2018 FINDINGS: Cardiovascular: Contrast injection is sufficient to demonstrate satisfactory opacification of the pulmonary arteries to the segmental level. There is no pulmonary embolus. The main pulmonary artery is significantly dilated measuring up to approximately 4 cm in diameter. The  main right pulmonary artery is dilated measuring approximately 4 cm in diameter. The main left pulmonary artery is dilated measuring approximately 3.4 cm in diameter. There is no CT evidence of acute right heart strain. There are atherosclerotic changes of the visualized thoracic aorta without evidence for thoracic aortic aneurysm. Heart size is enlarged. Advanced coronary artery calcifications are noted. Mediastinum/Nodes: --mild mediastinal adenopathy is noted, presumably reactive. --No axillary lymphadenopathy. --No supraclavicular lymphadenopathy. --Normal thyroid gland. --The esophagus is unremarkable Lungs/Pleura: Emphysematous changes are noted. There is near complete collapse of the left lower lobe with extensive debris within bronchus intermedius and the right lower lobe bronchus. There is near complete collapse of the right middle lobe. Airspace consolidation is noted in the right upper lobe. There is a small right-sided pleural effusion. Upper Abdomen: No acute abnormality. Musculoskeletal: There is no acute displaced fracture. There are old bilateral rib fractures. Review of the MIP images confirms the above findings. IMPRESSION: 1. No acute pulmonary embolism. 2. Near complete collapse of both the right middle and right lower lobes with extensive endobronchial debris within bronchus intermedius. Findings could be secondary to aspiration in the appropriate clinical setting. 3. Consolidation involving the right upper lobe concerning for pneumonia or aspiration pneumonia. 4. Small right-sided pleural effusion. 5. Significantly dilated pulmonary arteries which can be seen in patients with pulmonary artery hypertension. 6. Cardiomegaly.  Coronary artery disease is noted. Aortic Atherosclerosis (ICD10-I70.0) and Emphysema (ICD10-J43.9). Electronically Signed   By: Constance Holster M.D.   On: 02/18/2019 19:32   VAS [name] LOWER EXTREMITY VENOUS (DVT)  Result Date: 02/20/2019  Lower Venous Study Indications:  Swelling, and COVID positive.  Limitations: Body habitus and poor ultrasound/tissue interface. Comparison Study: No prior study. Performing Technologist: Maudry Mayhew MHA, RDMS, RVT, RDCS  Examination Guidelines: A complete evaluation includes B-mode imaging, spectral Doppler, color Doppler, and power Doppler as needed of all accessible portions of each vessel. Bilateral testing is considered an integral part of a complete examination. Limited examinations for reoccurring indications may be performed as noted.  +---------+---------------+---------+-----------+----------+--------------+  RIGHT     Compressibility Phasicity Spontaneity Properties Thrombus Aging  +---------+---------------+---------+-----------+----------+--------------+  CFV       Full            No        Yes                                    +---------+---------------+---------+-----------+----------+--------------+  FV Prox   Full                                                             +---------+---------------+---------+-----------+----------+--------------+  FV Mid    Full                                                             +---------+---------------+---------+-----------+----------+--------------+  FV Distal Full                                                             +---------+---------------+---------+-----------+----------+--------------+  PFV       Full                                                             +---------+---------------+---------+-----------+----------+--------------+  POP       Full            No        Yes                                    +---------+---------------+---------+-----------+----------+--------------+   Right Technical Findings: Not visualized segments include right saphenofemoral junction, posterior tibial, and peroneal veins.  +---------+---------------+---------+-----------+----------+--------------+  LEFT      Compressibility Phasicity Spontaneity Properties Thrombus Aging   +---------+---------------+---------+-----------+----------+--------------+  FV Mid    Full                                                             +---------+---------------+---------+-----------+----------+--------------+  FV Distal Full                                                             +---------+---------------+---------+-----------+----------+--------------+  POP       Full            No        Yes                                    +---------+---------------+---------+-----------+----------+--------------+  Left Technical Findings: Not visualized segments include left saphenofemoral junction, common femoral, profunda femoral, proximal femoral, posterior tibial, and peroneal veins.   Summary: Right: There is no evidence of deep vein thrombosis in the lower extremity. However, portions of this examination were limited- see technologist comments above. No cystic structure found in the popliteal fossa. Left: There is no evidence of deep vein thrombosis in the lower extremity. However, portions of this examination were limited- see technologist comments above. No cystic structure found in the popliteal fossa.  Bilateral lower extremity venous flow is pulsatile, suggestive of possible elevated right heart pressure. *See table(s) above for measurements and observations. Electronically signed by Monica Martinez MD on 02/20/2019 at 4:44:45 PM.    Final         Scheduled Meds:  vitamin C  500 mg Oral Daily   budesonide (PULMICORT) nebulizer solution  0.25 mg Nebulization BID   calcitRIOL  1.75 mcg Oral Q T,Th,Sa-HD   Chlorhexidine Gluconate Cloth  6 each Topical Q0600   clonazePAM  0.25 mg Oral Daily   clonazepam  0.25 mg Oral QHS   docusate sodium  100 mg Oral q morning - 10a   feeding supplement (NEPRO CARB STEADY)  237 mL Oral BID BM   FLUoxetine  10 mg Oral Daily   guaiFENesin  600 mg Oral BID   heparin  7,500 Units Subcutaneous Q8H   insulin aspart  0-5 Units  Subcutaneous QHS   insulin aspart  0-6 Units Subcutaneous TID WC   Ipratropium-Albuterol  2 puff Inhalation TID   levothyroxine  112 mcg Oral QAC breakfast   midodrine  10 mg Oral BID   montelukast  10 mg Oral QHS   multivitamin  1 tablet Oral QHS   mupirocin ointment   Topical Daily   rosuvastatin  40 mg Oral QHS   senna  1 tablet Oral q morning - 10a   sevelamer carbonate  1,600 mg Oral TID WC   sodium chloride  2 spray Each Nare TID   sodium chloride flush  3 mL Intravenous Q12H   zinc sulfate  220 mg Oral Daily   Continuous Infusions:  sodium chloride Stopped (02/14/19 1322)   aztreonam     metronidazole 500 mg (02/20/19 1425)     LOS: 11 days     Time spent: 35 minutes   Blain Pais, MD [facility]    02/20/2019, 4:59 PM

## 2019-02-20 NOTE — Progress Notes (Signed)
[facility] Progress Note   Home meds: - furosemide 80 qd/ midodrine 10 TTS pre hd - sevelamer carb ac tid - clonazepam 0.25 am and 0.50 pm/ fluoxetine 10 qd - rosuvastatin 40 hs - montelukast 10 hs/ levalbuterol tid prn/ home O2 3L - levothyroxine 112 ug - insulin detemir 15 qd/ lispro SSI tid   Outpt HD:TTS G-O  4h 400/800 94kg 2/2 bath TDC R - sensipar 60 po - venofer 50 /wk - mircera 75 ug q 2wk, last 1/14 - calc 1.75 ug tiw  Assessment/ Plan:   1. COVID + PNA - tested + on 1/12, will need to quarantine til approx 02/27/19. Getting IV steroids / remdesivir and IV vanc / aztreonam(stopped 1/17)for poss CAP  2. ESRD - on HD TTS at [place] shift   - will need new EDW when dc   - had HD here 1/21, 1/23 (paper charting) and got HD today off schedule, vol / solute are under control  -  will let next HD go until Thursday to get back on schedule  3. Hypotension/ volume - chronic hypotension on midodrine, 4-6kg below EDW in hospital. Midodriine had to be changedfrom pre HDto 10mg  bid daily.  4. DM 2 - on insulin 5. Anemia ckd - had esa1/14 (next due 1/28) 6. Renal osteodystrophy -ordered aphos for 1/20-> 4.8 (within goal) 7. COPD /chron resp failure - at home she req O2 5L 8. Pall Care - pt is enrolled in a community-based Palliative Care program at home. Consider PC consult for [place].   Kelly Splinter, MD 02/20/2019, 2:22 PM    Medications:    . vitamin C  500 mg Oral Daily  . budesonide (PULMICORT) nebulizer solution  0.25 mg Nebulization BID  . calcitRIOL  1.75 mcg Oral Q T,Th,Sa-HD  . Chlorhexidine Gluconate Cloth  6 each Topical Q0600  . clonazePAM  0.25 mg Oral Daily  . clonazepam  0.25 mg Oral QHS  . docusate sodium  100 mg Oral q morning - 10a  . feeding supplement (NEPRO CARB STEADY)  237 mL Oral BID BM  . FLUoxetine  10 mg Oral Daily  . guaiFENesin  600 mg Oral BID  . heparin  7,500 Units Subcutaneous Q8H  .  insulin aspart  0-5 Units Subcutaneous QHS  . insulin aspart  0-6 Units Subcutaneous TID WC  . Ipratropium-Albuterol  2 puff Inhalation TID  . levothyroxine  112 mcg Oral QAC breakfast  . midodrine  10 mg Oral BID  . montelukast  10 mg Oral QHS  . multivitamin  1 tablet Oral QHS  . mupirocin ointment   Topical Daily  . rosuvastatin  40 mg Oral QHS  . senna  1 tablet Oral q morning - 10a  . sevelamer carbonate  1,600 mg Oral TID WC  . sodium chloride  2 spray Each Nare TID  . sodium chloride flush  3 mL Intravenous Q12H  . zinc sulfate  220 mg Oral Daily    Subjective:   Seen in room, no c/o's   Objective:   BP (!) 134/53 (BP Location: Left Arm)   Pulse (!) 106   Temp 98 F (36.7 C) (Oral)   Resp (!) 22   Ht 5\' 1"  (1.549 m)   Wt 90.2 kg   SpO2 95%   BMI 37.57 kg/m   Intake/Output Summary (Last 24 hours) at 02/20/2019 1416 Last data filed at 02/20/2019 1043 Gross per 24 hour  Intake 100 ml  Output 1516 ml  Net -1416 ml   Weight change:   Physical Exam: Gen pleasant, breathing fairly comfortably today No jvd or bruits Chest clear bilatbilat, distant BS RRR no MRG Abd soft ntnd no mass or ascites +bsobese Extchronic 1+ distal pretib bilatedema, tender Neuro isawake but hard of hearing,nonfocal Access RIJ TC

## 2019-02-20 NOTE — Progress Notes (Signed)
Physical Therapy Treatment Patient Details Name: Joan Mosley MRN: [id number] DOB: 08/24/1945 Today's Date: 02/20/2019    History of Present Illness This 74 y.o. female admitted from HD 02/09/2019 center due to hypoxia.  Pt tested + for COVID on 02/07/2019.  Dx: COVID related PNA.  PMH includes: DM  , ESRD, COPD, Hypothyroidism    PT Comments    Patient with incr RR (upper 20s to 30s) with irregular HR low 100s at rest. Assisted pt to sit at EOB ~ 10 minutes with min guard assist. HR steadily incr to 120s, then suddenly incr to 130s-140s and assisted pt to lie back down. Attempted education in pursed lip breathing while at EOB with poor return demonstration. Pt repeatedly stating she wants to call her family and go home. She wants a wheelchair so she can go home. Unable to safely progress to OOB due to elevated HR and RR.     Follow Up Recommendations  SNF     Equipment Recommendations  Other (comment)(hoyer lift if pt goes home)    Recommendations for Other Services       Precautions / Restrictions Precautions Precautions: Fall Precaution Comments: O2 dependent at baseline Restrictions Weight Bearing Restrictions: No    Mobility  Bed Mobility Overal bed mobility: Needs Assistance Bed Mobility: Supine to Sit;Sit to Supine     Supine to sit: Mod assist;+2 for physical assistance;+2 for safety/equipment;HOB elevated Sit to supine: Max assist;+2 for safety/equipment;+2 for physical assistance   General bed mobility comments: assist with repositioning in bed, +2 max assist  Transfers                 General transfer comment: unable due to elevated HR (up to 145 irreg) and decr resp status  Ambulation/Gait                 Stairs             Wheelchair Mobility    Modified Rankin (Stroke Patients Only)       Balance Overall balance assessment: Needs assistance Sitting-balance support: Single extremity supported;Feet unsupported Sitting  balance-Leahy Scale: Poor Sitting balance - Comments: required UE support                                    Cognition Arousal/Alertness: Awake/alert Behavior During Therapy: Flat affect Overall Cognitive Status: No family/caregiver present to determine baseline cognitive functioning                                 General Comments: slow processing. May be due to [facility]. Repeatedly asking to call her family to come get her. Also asking for wheelchair so she can leave.       Exercises      General Comments General comments (skin integrity, edema, etc.): Pt on 10L HFNC with NRB (15L) with sats generally in mid90s, however with movement would get a lot of artifact and unclear how low her sats were (?85%, but not good pleth). Her RR was 30s       Pertinent Vitals/Pain Pain Assessment: Faces Faces Pain Scale: No hurt Pain Descriptors / Indicators: Grimacing Pain Intervention(s): Repositioned    Home Living Family/patient expects to be discharged to:: Private residence Living Arrangements: Children Available Help at Discharge: Family;Available 24 hours/day Type of Home: House Home Access: Ramped entrance   Home Layout:  One level Home Equipment: [place] - 2 wheels;Cane - single point;Wheelchair - power;Electric scooter Additional Comments: supplemental O2 5L at baseline    Prior Function Level of Independence: Needs assistance  Gait / Transfers Assistance Needed: transfers with two children ADL's / Homemaking Assistance Needed: has help with all ADL's  Comments: Pt reports she spends her time in and sleeps in her recliner.  her children assist with ADLs and transfers to [facility] and w/c.    PT Goals (current goals can now be found in the care plan section) Acute Rehab PT Goals Patient Stated Goal: feel better Time For Goal Achievement: 02/28/19 Potential to Achieve Goals: Fair Progress towards PT goals: Not progressing toward goals - comment(decr respiratory  and cardiac status)    Frequency    Min 2X/week      PT Plan Current plan remains appropriate    Co-evaluation PT/OT/SLP Co-Evaluation/Treatment: Yes Reason for Co-Treatment: Complexity of the patient's impairments (multi-system involvement);For patient/therapist safety PT goals addressed during session: Mobility/safety with mobility;Balance        AM-PAC PT "6 Clicks" Mobility   Outcome Measure  Help needed turning from your back to your side while in a flat bed without using bedrails?: A Lot Help needed moving from lying on your back to sitting on the side of a flat bed without using bedrails?: A Lot Help needed moving to and from a bed to a chair (including a wheelchair)?: Total Help needed standing up from a chair using your arms (e.g., wheelchair or bedside chair)?: Total Help needed to walk in hospital room?: Total Help needed climbing 3-5 steps with a railing? : Total 6 Click Score: 8    End of Session Equipment Utilized During Treatment: Oxygen Activity Tolerance: Treatment limited secondary to medical complications (Comment)(increased O2 needs) Patient left: in bed;with call bell/phone within reach;with bed alarm set   PT Visit Diagnosis: Muscle weakness (generalized) (M62.81);Other abnormalities of gait and mobility (R26.89)     Time: [id number] PT Time Calculation (min) (ACUTE ONLY): 30 min  Charges:  $Therapeutic Activity: 8-22 mins                      Arby Barrette, PT Pager [phone]    Rexanne Mano 02/20/2019, 4:04 PM

## 2019-02-20 NOTE — Progress Notes (Signed)
PT Cancellation Note  Patient Details Name: Joan Mosley MRN: [id number] DOB: 1945-09-08   Cancelled Treatment:    Reason Eval/Treat Not Completed: Patient at procedure or test/unavailable. Pt/bed out of room.    Arby Barrette, PT Pager [phone]    Rexanne Mano 02/20/2019, 10:58 AM

## 2019-02-20 NOTE — Progress Notes (Signed)
RT NOTE: RT came to patient's room to check on patient and salter HFNC. Patient is gone from room for procedure. RT will attempt to see patient later when she returns.

## 2019-02-21 LAB — GLUCOSE, CAPILLARY
Glucose-Capillary: 183 mg/dL — ABNORMAL HIGH (ref 70–99)
Glucose-Capillary: 135 mg/dL — ABNORMAL HIGH (ref 70–99)
Glucose-Capillary: 139 mg/dL — ABNORMAL HIGH (ref 70–99)
Glucose-Capillary: 91 mg/dL (ref 70–99)
Glucose-Capillary: 110 mg/dL — ABNORMAL HIGH (ref 70–99)

## 2019-02-21 MED ORDER — IPRATROPIUM-ALBUTEROL 20-100 MCG/ACT IN AERS
2.0000 | INHALATION_SPRAY | Freq: Four times a day (QID) | RESPIRATORY_TRACT | Status: DC
  Administered 2019-02-21 – 2019-02-22 (×4): 2 via RESPIRATORY_TRACT
  Filled 2019-02-21: qty 4

## 2019-02-21 NOTE — Progress Notes (Signed)
PROGRESS NOTE    Joan Mosley  [id number] DOB: Jul 28, 1945 DOA: 02/09/2019 PCP: Flossie Buffy, NP    Brief Narrative: 74 year old lady with prior history of end-stage renal disease on dialysis, COPD,, chronic respiratory failure on 5 L of home oxygen, type 2 diabetes mellitus, hypothyroidism, TIA presented to ED from dialysis for evaluation of hypoxia.  Patient was tested positive for COVID-19 on 02/07/2019 and 20 minutes into her dialysis she was noted to be hypoxic and was brought to ED for further evaluation.  On arrival to ED patient was saturating 88% on nonrebreather and she was switched to 20 L of high flow oxygen and sats have been around 92%.  Chest x-ray showed asymmetric pulmonary opacities compatible with COVID-19 pneumonia mildly progressed in the lower lungs compared to prior chest x-ray from 2 days ago. She is now admitted for further evaluation and management on acute on chronic respiratory failure with hypoxia secondary to COVID-19 multifocal pneumonia. Nephrology consulted for dialysis. She underwent HD and her breathing has improved. We were able to wean her down to 9 lit of HF oxygen and her sats have remained well above 95%.  Meanwhile palliative care consulted for [place], plan for outpatient palliative care follow up .  Of note patient had missed one HD session and missed more than a week of her synthroid tablets while waiting for her scripts to be filled. Recommend rechecking thyroid panel in 3 to 4 weeks post discharge and adjusting medications as needed.  Pt still on 10-15 lit of HF oxygen.  Had decompensation on 1/23 evening but improved.  Remains on 15L/min HFNC with O2 sats in 89-94%, not stable for dc to home or to SNF.  Patient does not want hospice care. Palliative care consulted with family discussion, no changes on GOC per the patient or her family, they want to continue full/agressive medical care.    Assessment & Plan:   Principal Problem:   Pneumonia due  to COVID-19 virus Active Problems:   Type 2 diabetes mellitus with diabetic polyneuropathy, with long-term current use of insulin (HCC)   ESRD (end stage renal disease) (HCC)   Acute on chronic respiratory failure with hypoxia (HCC)   COPD (chronic obstructive pulmonary disease) (HCC)   COVID-19   Aspiration into airway   Acute on chronic respiratory failure with hypoxia secondary to COVID-19 multifocal pneumonia Patient tested positive for Covid on 02/07/2019.  She initially required up to 20 L of high flow nasal cannula oxygen but after dialysis was weaned down to 10 L but then up to 15 L/min again of high flow oxygen.  PT evaluation ordered recommending SNF but her son and HPOA not wanting this. She has been to [facility] before and her son is open for her placement there (she has been at [place] before). CM consult for LTACH placement.   -continue with incentive spirometry and flutter valve--patient needs guidance and encouragement to use these devices and has issues with expectoration, sometimes needing coaching and assistance with deep breathing. -CXR reviewed with Radiologist, indicating atelectasis, small effusion not amenable for thoracentesis. Will repeat CXR in am.  -Requested RT consult for chest physiotherapy as tolerated.  -Wean off O2 supplementation as tolerated, O2 sats have fluctuated, O2 sats remain stable on 12-15L/min O2 supplementation--which could not be provided per her home equipment. She is on 5L/min via [place] at baseline.  -Possible aspiration on 1/23. Continue Abx peer PCCM. Continue aspiration precautions. SLP has evaluated,patient now on dysphagia 3 diet with assistance.  -  She seems to have decrease in expectoration every afternoon between 2-4 pm and may need RT evaluation and treatment at that time.    Left knee pain.  Noted on 1/22, Venous Doppler US ordered, completed on 1/24, showed no DVT.   CTA PE protocol ordered on 1/23 due to worsening respiratory status, showed no  PE.     LDH is within normal limits CRP is elevated, pro calcitonin less than 0.5, WBC count within normal limits, MRSA PCR is negative. Chest x-ray on admission showed asymmetric pulmonary opacity compatible with COVID-19 pneumonia  Mild progression in both lower lungs since 02/07/2019. Completed IV Decadron 6 mg daily Completed IV remdesivir She will need to quarantine till 02/27/19. She remains afebrile and wbc count within normal limits.    Hypothyroidism Continue with Synthroid 112 MCG daily. TSH abnormal and high at 14.  As per the son patient missed her Synthroid for more than a week while waiting for her prescription to be filled out.  Recommend outpatient follow-up with PCP for repeat thyroid panel in about 3 weeks.   End-stage renal disease on dialysis Tuesday Thursday and Saturday. Volume management as per dialysis Nephrology following, HD per their recommendations.   Insulin-dependent diabetes mellitus with polyneuropathy and chronic kidney disease CBG (last 3)  Recent Labs    02/21/19 0059 02/21/19 0810 02/21/19 1152  GLUCAP 183* 135* 139*   Discontinued lantus as her CBG were in the 60's in the am and after dialysis.  Continue with SSI. Last A1c is 5.6.  Was on D5 infusion briefly on 1/24 due to NPO status.     Body mass index is 38.24 kg/m. Morbidly obese DVT prophylaxis heparin  7500 TID.     History of COPD, advanced, on 5L/min via [place] of O2 supplementation at baseline.  No wheezing heard Continue with bronchodilators as needed. Continue Mucinex   Pressure injury on admission.   Pressure Injury 02/13/19 Heel Left Deep Tissue Pressure Injury - Purple or maroon localized area of discolored intact skin or blood-filled blister due to damage of underlying soft tissue from pressure and/or shear. (Active)  02/13/19 1000  Location: Heel  Location Orientation: Left  Staging: Deep Tissue Pressure Injury - Purple or maroon localized area of discolored intact  skin or blood-filled blister due to damage of underlying soft tissue from pressure and/or shear.  Wound Description (Comments):   Present on Admission:    Wound care consulted.       DVT prophylaxis: Heparin Code Status: Full code. Her son, who is a paramedic, is her HPOA, understands that the patient may not recover from prolonged intubation but, along with the patient, would like for the patient to remain Full code. There have been several in-depth discussions on code status. Palliative care has engaged with the family and with the patient.  Family Communication: Discussed with the patient.  Disposition Plan: pending improvement in hypoxia, will likely need LTACH placement.  Consultants:   Nephrology  Palliative care consulted  Wound care.   Procedures: None Antimicrobials: None  Subjective: Patient appears to have decrease in expectoration every afternoon, after lunch and complains of increase in shortness of breath. She remains on 15L/min of HFNC, reports that she is not able to use flutter valve as her lungs feel "too weak" to use it. She denies fever, chills, nausea, or vomiting. She had low temperature this am of 96.56F but this appeared to have resolved on its own (question possible equivocal measurement).    Objective: Vitals:  02/21/19 0800 02/21/19 0825 02/21/19 1000 02/21/19 1200  BP: 105/68 105/68  116/76  Pulse:  96    Resp: 14 20 (!) 21 18  Temp: (!) 96.7 F (35.9 C)   98.1 F (36.7 C)  TempSrc: Axillary   Axillary  SpO2: 96% 90%  95%  Weight:      Height:        Intake/Output Summary (Last 24 hours) at 02/21/2019 1657 Last data filed at 02/21/2019 0900 Gross per 24 hour  Intake 953.89 ml  Output --  Net 953.89 ml   Filed Weights   02/20/19 0729 02/20/19 1043 02/21/19 0309  Weight: 92.2 kg 90.2 kg 91.8 kg    Examination:  General exam: Resting in bed,tired appearing Respiratory system:  shallow breaths, no wheezing.  Cardiovascular system: S1S2  present, RRR.  Gastrointestinal system: Abdomen is soft, non tender non distended Central nervous system: arousable,  oriented, able to answer all questions appropriately, follows commands. No facial droop.   Extremities: no cyanosis or clubbing.  Skin: left heel deep injury.  Psychiatry: mood is appropriate.     Data Reviewed: I have personally reviewed following labs and imaging studies  CBC: Recent Labs  Lab 02/20/19 0613  WBC 11.1*  NEUTROABS 7.7  HGB 9.6*  HCT 32.0*  MCV 96.4  PLT [id number]   Basic Metabolic Panel: Recent Labs  Lab 02/15/19 0625 02/16/19 0541 02/17/19 0504 02/20/19 0613  NA 140 138 134* 139  K 3.9 5.6* 3.6 4.0  CL 101 101 93* 97*  CO2 24 22 26 26   GLUCOSE 77 76 82 86  BUN 27* 44* 23 31*  CREATININE 4.69* 6.48* 4.06* 5.02*  CALCIUM 8.8* 8.9 8.8* 9.2  PHOS 4.8*  --  4.8* 5.3*   GFR: Estimated Creatinine Clearance: 10 mL/min (A) (by C-G formula based on SCr of 5.02 mg/dL (H)). Liver Function Tests: Recent Labs  Lab 02/17/19 0504 02/20/19 0613  ALBUMIN 3.0* 3.1*   No results for input(s): LIPASE, AMYLASE in the last 168 hours. No results for input(s): AMMONIA in the last 168 hours. Coagulation Profile: No results for input(s): INR, PROTIME in the last 168 hours. Cardiac Enzymes: No results for input(s): CKTOTAL, CKMB, CKMBINDEX, TROPONINI in the last 168 hours. BNP (last 3 results) No results for input(s): PROBNP in the last 8760 hours. HbA1C: No results for input(s): HGBA1C in the last 72 hours. CBG: Recent Labs  Lab 02/20/19 1533 02/20/19 2001 02/21/19 0059 02/21/19 0810 02/21/19 1152  GLUCAP 227* 72 183* 135* 139*   Lipid Profile: No results for input(s): CHOL, HDL, LDLCALC, TRIG, CHOLHDL, LDLDIRECT in the last 72 hours. Thyroid Function Tests: No results for input(s): TSH, T4TOTAL, FREET4, T3FREE, THYROIDAB in the last 72 hours. Anemia Panel: No results for input(s): VITAMINB12, FOLATE, FERRITIN, TIBC, IRON, RETICCTPCT in the  last 72 hours. Sepsis Labs: No results for input(s): PROCALCITON, LATICACIDVEN in the last 168 hours.  Recent Results (from the past 240 hour(s))  Culture, blood (routine x 2)     Status: None (Preliminary result)   Collection Time: 02/18/19 10:14 PM   Specimen: BLOOD RIGHT HAND  Result Value Ref Range Status   Specimen Description BLOOD RIGHT HAND  Final   Special Requests   Final    BOTTLES DRAWN AEROBIC AND ANAEROBIC Blood Culture adequate volume   Culture   Final    NO GROWTH 3 DAYS Performed at [facility], [place] [address] [place], [place] [address]    Report Status PENDING  Incomplete  Culture, blood (routine x 2)     Status: None (Preliminary result)   Collection Time: 02/18/19 10:20 PM   Specimen: BLOOD LEFT HAND  Result Value Ref Range Status   Specimen Description BLOOD LEFT HAND  Final   Special Requests   Final    BOTTLES DRAWN AEROBIC ONLY Blood Culture results may not be optimal due to an inadequate volume of blood received in culture bottles   Culture   Final    NO GROWTH 3 DAYS Performed at [facility], [place] [address] [place], [place] [address]    Report Status PENDING  Incomplete         Radiology Studies: No results found.      Scheduled Meds: . vitamin C  500 mg Oral Daily  . calcitRIOL  1.75 mcg Oral Q T,Th,Sa-HD  . Chlorhexidine Gluconate Cloth  6 each Topical Q0600  . clonazePAM  0.25 mg Oral Daily  . clonazepam  0.25 mg Oral QHS  . docusate sodium  100 mg Oral q morning - 10a  . feeding supplement (NEPRO CARB STEADY)  237 mL Oral BID BM  . FLUoxetine  10 mg Oral Daily  . guaiFENesin  600 mg Oral BID  . heparin  7,500 Units Subcutaneous Q8H  . insulin aspart  0-5 Units Subcutaneous QHS  . insulin aspart  0-6 Units Subcutaneous TID WC  . Ipratropium-Albuterol  2 puff Inhalation Q6H  . levothyroxine  112 mcg Oral QAC breakfast  . midodrine  10 mg Oral BID  . montelukast  10 mg Oral QHS  . multivitamin  1 tablet Oral QHS  .  mupirocin ointment   Topical Daily  . rosuvastatin  40 mg Oral QHS  . senna  1 tablet Oral q morning - 10a  . sevelamer carbonate  1,600 mg Oral TID WC  . sodium chloride  2 spray Each Nare TID  . sodium chloride flush  3 mL Intravenous Q12H  . zinc sulfate  220 mg Oral Daily   Continuous Infusions: . sodium chloride 10 mL/hr at 02/21/19 1204  . aztreonam 500 mg (02/21/19 1328)  . metronidazole 500 mg (02/21/19 1207)     LOS: 12 days     Time spent: 35 minutes   Blain Pais, MD [facility]    02/21/2019, 4:57 PM

## 2019-02-21 NOTE — Progress Notes (Signed)
  Speech Language Pathology Treatment: Dysphagia  Patient Details Name: Joan Mosley MRN: [id number] DOB: 04-26-1945 Today's Date: 02/21/2019 Time: [id number] SLP Time Calculation (min) (ACUTE ONLY): 28 min  Assessment / Plan / Recommendation Clinical Impression  Pt continues on 15L HFNC, sleeping upon arrival but awakened. She remained awake for majority of session but deconditioned with low endurance.Consumed nectar thick liquids and Dys 3 texture without s/s aspiration or with concerning oral phase. Pt is able to self feed but tires after approximately 15 min. RR and SpO2 remained stable. Min cues for small sips. Fatigue may prevent adequate intake. Recommend continue with Dys 3,nectar and ST.     HPI HPI: 74 year old female with past medical history significant for ESRD and COPD on 5 L home O2 who was admitted 1/15 with Covid-19 pneumonia.  She has been treated with remdesivir and Decadron. 1/23 with increasing 02 requirements. 1/23 CT chest>>"no PE, near complete collapse of both the right middle and right lower lobes with extensive endobronchial debris. Consolidation involving the right upper lobe concerning for pneumonia or aspiration pneumonia." Speech consulted for swallowing given concerns for aspiration.  Pt is known to SLP services; has had swallow assessments during admissions in October and November.  MBS on 11/30 revealed mild pharyngeal dysphagia with frank penetration of thin liquids to the vocal folds; chin tuck assisted in airway protection.       SLP Plan  Continue with current plan of care       Recommendations  Diet recommendations: Dysphagia 3 (mechanical soft);Nectar-thick liquid Liquids provided via: Cup;Straw Medication Administration: Whole meds with puree Supervision: Staff to assist with self feeding Compensations: Minimize environmental distractions;Other (Comment) Postural Changes and/or Swallow Maneuvers: Out of bed for meals;Seated upright 90 degrees               Oral Care Recommendations: Oral care BID Follow up Recommendations: 24 hour supervision/assistance;Skilled Nursing facility SLP Visit Diagnosis: Dysphagia, oropharyngeal phase (R13.12) Plan: Continue with current plan of care       GO                [name] 02/21/2019, 2:34 PM   [name] Colvin Caroli.Ed Risk analyst [phone] Office [phone]

## 2019-02-21 NOTE — Progress Notes (Signed)
Palliative Medicine RN Note: Our team remains available as needed, and we continue to monitor for the need for re-engagement. Her family has made it very clear that they will continue to honor her wishes for full aggressive care as long as she is awake and alert, which she is, so there is not a place for [name] to engage.  If the patient asks to speak to [name], please call us. Otherwise, we will continue to monitor Joan Mosley's clinical progress.  Marjie Skiff Nickie Deren, RN, BSN, [facility] Palliative Medicine Team 02/21/2019 2:08 PM Office [phone]

## 2019-02-21 NOTE — Progress Notes (Signed)
[facility] Progress Note   Home meds: - furosemide 80 qd/ midodrine 10 TTS pre hd - sevelamer carb ac tid - clonazepam 0.25 am and 0.50 pm/ fluoxetine 10 qd - rosuvastatin 40 hs - montelukast 10 hs/ levalbuterol tid prn/ home O2 3L - levothyroxine 112 ug - insulin detemir 15 qd/ lispro SSI tid   Outpt HD:TTS G-O  4h 400/800 94kg 2/2 bath TDC R - sensipar 60 po - venofer 50 /wk - mircera 75 ug q 2wk, last 1/14 - calc 1.75 ug tiw  Assessment/ Plan:   1. COVID + PNA - tested + on 1/12, will need to quarantine til approx 02/27/19. Getting IV steroids / remdesivir and IV vanc / aztreonam(stopped 1/17)for poss CAP  2. ESRD - on HD TTS at [facility] shift   - will need new EDW when dc   - had HD here 1/21, 1/23 (paper charting) and got HD yest 1/25 > plan next HD Thursday to get back on schedule  3. Hypotension/ volume - chronic hypotension on midodrine, 3kg below EDW in hospital. Midodriine had to be changedfrom pre HDto 10mg  bid daily.  4. DM 2 - on insulin 5. Anemia ckd - had esa1/14 (next due 1/28) 6. Renal osteodystrophy -ordered aphos for 1/20-> 4.8 (within goal) 7. COPD /chron resp failure - at home she req O2 5L 8. Pall Care - pt is enrolled in a community-based Palliative Care program at home. Consider PC consult for [place].   Kelly Splinter, MD 02/21/2019, 11:21 AM    Medications:    . vitamin C  500 mg Oral Daily  . budesonide (PULMICORT) nebulizer solution  0.25 mg Nebulization BID  . calcitRIOL  1.75 mcg Oral Q T,Th,Sa-HD  . Chlorhexidine Gluconate Cloth  6 each Topical Q0600  . clonazePAM  0.25 mg Oral Daily  . clonazepam  0.25 mg Oral QHS  . docusate sodium  100 mg Oral q morning - 10a  . feeding supplement (NEPRO CARB STEADY)  237 mL Oral BID BM  . FLUoxetine  10 mg Oral Daily  . guaiFENesin  600 mg Oral BID  . heparin  7,500 Units Subcutaneous Q8H  . insulin aspart  0-5 Units Subcutaneous QHS  . insulin aspart   0-6 Units Subcutaneous TID WC  . Ipratropium-Albuterol  2 puff Inhalation TID  . levothyroxine  112 mcg Oral QAC breakfast  . midodrine  10 mg Oral BID  . montelukast  10 mg Oral QHS  . multivitamin  1 tablet Oral QHS  . mupirocin ointment   Topical Daily  . rosuvastatin  40 mg Oral QHS  . senna  1 tablet Oral q morning - 10a  . sevelamer carbonate  1,600 mg Oral TID WC  . sodium chloride  2 spray Each Nare TID  . sodium chloride flush  3 mL Intravenous Q12H  . zinc sulfate  220 mg Oral Daily    Subjective:   Seen in room, no c/o's   Objective:   BP 105/68   Pulse 96   Temp (!) 96.7 F (35.9 C) (Axillary)   Resp (!) 21   Ht 5\' 1"  (1.549 m)   Wt 91.8 kg   SpO2 90%   BMI 38.24 kg/m   Intake/Output Summary (Last 24 hours) at 02/21/2019 1121 Last data filed at 02/21/2019 0900 Gross per 24 hour  Intake 953.89 ml  Output --  Net 953.89 ml   Weight change: 0.1 kg  Physical Exam: Gen pleasant, breathing fairly comfortably  today No jvd or bruits Chest clear bilatbilat, distant BS RRR no MRG Abd soft ntnd no mass or ascites +bsobese Extchronic 1+ distal pretib bilatedema, tender Neuro isawake but hard of hearing,nonfocal Access RIJ TC

## 2019-02-22 ENCOUNTER — Inpatient Hospital Stay (HOSPITAL_COMMUNITY): Payer: Medicare Other

## 2019-02-22 LAB — GLUCOSE, CAPILLARY
Glucose-Capillary: 105 mg/dL — ABNORMAL HIGH (ref 70–99)
Glucose-Capillary: 115 mg/dL — ABNORMAL HIGH (ref 70–99)
Glucose-Capillary: 119 mg/dL — ABNORMAL HIGH (ref 70–99)
Glucose-Capillary: 126 mg/dL — ABNORMAL HIGH (ref 70–99)

## 2019-02-22 MED ORDER — DEXTROSE 5 % IV SOLN: 500.0000 mg | Freq: Two times a day (BID) | INTRAVENOUS | Status: DC

## 2019-02-22 MED ORDER — GUAIFENESIN ER 600 MG PO TB12: 600.0000 mg | ORAL_TABLET | Freq: Two times a day (BID) | ORAL | Status: DC

## 2019-02-22 MED ORDER — ASCORBIC ACID 500 MG PO TABS: 500.0000 mg | ORAL_TABLET | Freq: Every day | ORAL | Status: DC

## 2019-02-22 MED ORDER — SEVELAMER CARBONATE 800 MG PO TABS: 800.0000 mg | ORAL_TABLET | ORAL | Status: DC | PRN

## 2019-02-22 MED ORDER — CHLORHEXIDINE GLUCONATE CLOTH 2 % EX PADS: 6.0000 | MEDICATED_PAD | Freq: Every day | CUTANEOUS | Status: DC

## 2019-02-22 MED ORDER — METRONIDAZOLE IN NACL 5-0.79 MG/ML-% IV SOLN: 500.0000 mg | Freq: Three times a day (TID) | INTRAVENOUS | Status: DC

## 2019-02-22 MED ORDER — HEPARIN SODIUM (PORCINE) 5000 UNIT/ML IJ SOLN: 7500.0000 [IU] | Freq: Three times a day (TID) | INTRAMUSCULAR | Status: DC

## 2019-02-22 NOTE — TOC Progression Note (Signed)
Transition of Care [facility]) - Progression Note    Patient Details  Name: Lynnette Castles MRN: [id number] Date of Birth: 1945/04/26  Transition of Care [facility]) CM/SW Contact  Maryclare Labrador, RN Phone Number: 02/22/2019, 12:09 PM  Clinical Narrative:   [facility] pt [facility] today if one becomes available.  CM spoke with attending, pts son (POA) Ryan and pt - all are in agreement for pt to discharge to [facility] today if bed becomes available    Expected Discharge Plan: [place] Barriers to Discharge: Continued Medical Work up  Expected Discharge Plan and Services Expected Discharge Plan: [place] In-house Referral: Clinical Social Work Discharge Planning Services: CM Consult Post Acute Care Choice: [place] arrangements for the past 2 months: Single Family Home                   DME Agency: [place]                   Social Determinants of Health (SDOH) Interventions    Readmission Risk Interventions Readmission Risk Prevention Plan 10/04/2018  Transportation Screening Complete  Medication Review Press photographer) Complete  PCP or Specialist appointment within 3-5 days of discharge Complete  HRI or [place] Complete  SW Recovery Care/Counseling Consult Complete  Palliative Care Screening Complete  [place] Patient Refused

## 2019-02-22 NOTE — Progress Notes (Addendum)
[facility] Progress Note   Home meds: - furosemide 80 qd/ midodrine 10 TTS pre hd - sevelamer carb ac tid - clonazepam 0.25 am and 0.50 pm/ fluoxetine 10 qd - rosuvastatin 40 hs - montelukast 10 hs/ levalbuterol tid prn/ home O2 3L - levothyroxine 112 ug - insulin detemir 15 qd/ lispro SSI tid   Outpt HD:TTS G-O  4h 400/800 94kg 2/2 bath TDC R Hep none - sensipar 60 po - venofer 50 /wk - mircera 75 ug q 2wk, last 1/14 - calc 1.75 ug tiw  Assessment/ Plan:   1. COVID + PNA - tested + on 1/12, sp remdesivir/ decadron IV on last admit. Readmitted 1/70for poss CAP. Getting IV aztreonam/ flagyl here  2. ESRD - on HD TTS at [place] shift   - will need new EDW when dc   - had HD here 1/21, 1/23 (paper charting) and 1/25   - next HD tomorrow to get back on schedule, check labs in am  3. Hypotension/ volume - chronic hypotension on midodrine, changedfrom pre HDto 10mg  bid - below edw 2-3 here - difficult to UF w/ hypotension, goal 2-2.5 L  4. DM 2 - on insulin 5. Anemia ckd - had esa1/14 (next due 1/28) 6. Renal osteodystrophy -ordered aphos for 1/20-> 4.8 (within goal) 7. COPD /chron resp failure - at home she req O2 5L   Kelly Splinter, MD 02/22/2019, 1:47 PM    Medications:    . vitamin C  500 mg Oral Daily  . budesonide (PULMICORT) nebulizer solution  0.25 mg Nebulization BID  . calcitRIOL  1.75 mcg Oral Q T,Th,Sa-HD  . Chlorhexidine Gluconate Cloth  6 each Topical Q0600  . clonazePAM  0.25 mg Oral Daily  . clonazepam  0.25 mg Oral QHS  . docusate sodium  100 mg Oral q morning - 10a  . feeding supplement (NEPRO CARB STEADY)  237 mL Oral BID BM  . FLUoxetine  10 mg Oral Daily  . guaiFENesin  600 mg Oral BID  . heparin  7,500 Units Subcutaneous Q8H  . insulin aspart  0-5 Units Subcutaneous QHS  . insulin aspart  0-6 Units Subcutaneous TID WC  . Ipratropium-Albuterol  2 puff Inhalation TID  . levothyroxine  112 mcg Oral  QAC breakfast  . midodrine  10 mg Oral BID  . montelukast  10 mg Oral QHS  . multivitamin  1 tablet Oral QHS  . mupirocin ointment   Topical Daily  . rosuvastatin  40 mg Oral QHS  . senna  1 tablet Oral q morning - 10a  . sevelamer carbonate  1,600 mg Oral TID WC  . sodium chloride  2 spray Each Nare TID  . sodium chloride flush  3 mL Intravenous Q12H  . zinc sulfate  220 mg Oral Daily    Subjective:   Seen in room, no c/o's   Objective:   BP 126/75 (BP Location: Left Arm)   Pulse 69   Temp (!) 97.3 F (36.3 C) (Oral)   Resp 18   Ht 5\' 1"  (1.549 m)   Wt 91.8 kg   SpO2 90%   BMI 38.24 kg/m   Intake/Output Summary (Last 24 hours) at 02/22/2019 1347 Last data filed at 02/22/2019 0900 Gross per 24 hour  Intake 240 ml  Output --  Net 240 ml   Weight change:   Physical Exam: Gen pleasant, breathing fairly comfortably today No jvd or bruits Chest clear bilatbilat, distant BS RRR no MRG Abd  soft ntnd no mass or ascites +bsobese Extchronic 1+ distal pretib bilatedema, tender Neuro isawake but hard of hearing,nonfocal Access RIJ TC

## 2019-02-22 NOTE — Progress Notes (Signed)
Pharmacy Antibiotic Note  Joan Mosley is a 74 y.o. female admitted on 02/09/2019 with COVID>> treating for aspiration.  Pharmacy has been consulted for Aztreonam dosing.  ID: Aztreonam for aspiration pneumo given PCN allergy. WBC 11.1 up. Afebrile.  - PCN allergy rash. No hx of getting ceph. Pt did not answer phone to confirm allergy - 1/24: Pt started on 1g IV q8h but pt ESRD. 2g administered at 0100 and 1g at [place] s/p remdesivir.   Vanc 1/15>>1/16 Aztreo 1/15>>1/17, 1/24>> Flagyl 1/23>>  Remdesivir 1/15>>1/18 Decadron 1/14 >>1/23  1/16 MRSA PCR - negative 1/23 Bcx>>  Plan: Aztreonam 500mg  q12h due to ESRD TTS      Height: 5\' 1"  (154.9 cm) Weight: 202 lb 6.1 oz (91.8 kg) IBW/kg (Calculated) : 47.8  Temp (24hrs), Avg:97.7 F (36.5 C), Min:97.3 F (36.3 C), Max:98.3 F (36.8 C)  Recent Labs  Lab 02/16/19 0541 02/17/19 0504 02/20/19 0613  WBC  --   --  11.1*  CREATININE 6.48* 4.06* 5.02*    Estimated Creatinine Clearance: 10 mL/min (A) (by C-G formula based on SCr of 5.02 mg/dL (H)).    Allergies  Allergen Reactions  . Avelox [Moxifloxacin Hcl In Nacl] Other (See Comments)    Unknown reaction  . Codeine Anaphylaxis  . Penicillins Rash    Did it involve swelling of the face/tongue/throat, SOB, or low BP? Unk Did it involve sudden or severe rash/hives, skin peeling, or any reaction on the inside of your mouth or nose? Yes Did you need to seek medical attention at a hospital or doctor's office? Unk When did it last happen? Unk If all above answers are "NO", may proceed with cephalosporin use.   . Sulfa Antibiotics Other (See Comments)    Unknown reaction  . Dextromethorphan-Guaifenesin Other (See Comments)    Reported by [facility] - unknown reaction. Pt states she is not allergic to dextromethorphan-guaifenesin.  . Shellfish Allergy Hives, Itching and Rash    Damione Robideau S. Alford Highland, PharmD, BCPS Clinical Staff Pharmacist [email or web address]  Wayland [place] 02/22/2019 12:57 PM

## 2019-02-22 NOTE — Progress Notes (Signed)
RN attempted to call report to RN at [place] twice, [place] and 1820. RN was unable to take report at these times.

## 2019-02-22 NOTE — Care Management Important Message (Signed)
Important Message  Patient Details  Name: Joan Mosley MRN: [id number] Date of Birth: September 22, 1945   Medicare Important Message Given:  Yes - Important Message mailed due to current National Emergency  Verbal consent obtained due to current National Emergency  Relationship to patient: Child Contact Name: Ashanna Personette Call Date: 02/22/19  Time: 1454 Phone: [phone] Outcome: Spoke with contact Important Message mailed to: Patient address on file    Delorse Lek 02/22/2019, 2:55 PM

## 2019-02-22 NOTE — Progress Notes (Signed)
Patient discharged to [facility] per orders. Discharge instructions sent to 5E. Patient verbalized understanding. Gates Rigg RN verbalized understanding. IV intact per 5E RN. Belongings packed up by staff. Denies needs at this time. Patient off unit via bed by this RN and [place], NT.

## 2019-02-22 NOTE — TOC Transition Note (Addendum)
Transition of Care [facility]) - CM/SW Discharge Note   Patient Details  Name: Joan Mosley MRN: [id number] Date of Birth: 04-Jan-1946  Transition of Care [facility]) CM/SW Contact:  Maryclare Labrador, RN Phone Number: 02/22/2019, 3:03 PM   Clinical Narrative:   Pt has been offered bed at [facility] today.  Pt and son [name] has accepted bed offer.  CM spoke with both via phone.  [facility] liason will inform CM of room number and report number.  Bedside nurse/attending to complete EMTALA.     Pt will go to room 5e16 and nurse given report number.  [facility] will inform nurse when pt can transfer as the room needs to be cleaned.  Pts son [name] given room number   Final next level of care: Long Term Acute Care (LTAC) Barriers to Discharge: Barriers Resolved   Patient Goals and CMS Choice Patient states their goals for this hospitalization and ongoing recovery are:: Get better enough to go home and be COVID negative CMS Medicare.gov Compare Post Acute Care list provided to:: Patient Choice offered to / list presented to : Patient, Adult Children  Discharge Placement                Patient to be transferred to facility by: Bed transfer to [facility] Name of family member notified: Claretta Remus (son) Patient and family notified of of transfer: 02/22/19  Discharge Plan and Services In-house Referral: Clinical Social Work Discharge Planning Services: CM Consult Post Acute Care Choice: Home Health            DME Agency: [place]                  Social Determinants of Health (SDOH) Interventions     Readmission Risk Interventions Readmission Risk Prevention Plan 10/04/2018  Transportation Screening Complete  Medication Review Press photographer) Complete  PCP or Specialist appointment within 3-5 days of discharge Complete  HRI or [place] Complete  SW Recovery Care/Counseling Consult Complete  Palliative Care Screening Complete  [place] Patient Refused

## 2019-02-22 NOTE — Discharge Summary (Signed)
Physician Discharge Summary  Joan Mosley [id number] DOB: 1945/08/02 DOA: 02/09/2019  PCP: Flossie Buffy, NP  Admit date: 02/09/2019 Discharge date: 02/22/2019  Admitted From: Home.  Disposition:   Select hospital.   Recommendations for Outpatient Follow-up:  Follow up with nephrology for HD Please follow up with palliative care services on discharge.    Discharge Condition:guarded.   CODE STATUS:full code.  Diet recommendation: Renal/ dysphagia diet.  Diet recommendations: Dysphagia 3 (mechanical soft);Nectar-thick liquid Liquids provided via: Cup;Straw Medication Administration: Whole meds with puree Supervision: Staff to assist with self feeding Compensations: Minimize environmental distractions;Other (Comment)(rest breaks) Postural Changes and/or Swallow Maneuvers: Seated upright 90 degrees           Brief/Interim Summary: 74 year old lady with prior history of end-stage renal disease on dialysis, COPD,, chronic respiratory failure on 5 L of home oxygen, type 2 diabetes mellitus, hypothyroidism, TIA presented to ED from dialysis for evaluation of hypoxia. Patient was tested positive for COVID-19 on 02/07/2019 and 20 minutes into her dialysis she was noted to be hypoxic and was brought to ED for further evaluation. On arrival to ED patient was saturating 88% on nonrebreather and she was switched to 20 L of high flow oxygen and sats have been around 92%. Chest x-ray showed asymmetric pulmonary opacities compatible with COVID-19 pneumonia mildly progressed in the lower lungs compared to prior chest x-ray from 2 days ago. She is now admitted for further evaluation and management on acute on chronic respiratory failure with hypoxia secondary to COVID-19 multifocal pneumonia. Nephrology consulted for dialysis. She underwent HD and her breathing has improved. We were able to wean her down to 9 lit of HF oxygen and her sats have remained well above 95%.  Meanwhile  palliative care consulted for [place], plan for outpatient palliative care follow up .  Of note patient had missed one HD session and missed more than a week of her synthroid tablets while waiting for her scripts to be filled. Recommend rechecking thyroid panel in 3 to 4 weeks post discharge and adjusting medications as needed.  Pt still on 10-15 lit of HF oxygen.  Had decompensation on 1/23 evening but improved.  Remains on 15L/min HFNC with O2 sats in 89-94%, not stable for dc to home or to SNF.  Patient does not want hospice care. Palliative care consulted with family discussion, no changes on GOC per the patient or her family, they want to continue full/agressive medical care.   Discharge Diagnoses:  Principal Problem:   Pneumonia due to COVID-19 virus Active Problems:   Type 2 diabetes mellitus with diabetic polyneuropathy, with long-term current use of insulin (HCC)   ESRD (end stage renal disease) (HCC)   Acute on chronic respiratory failure with hypoxia (HCC)   COPD (chronic obstructive pulmonary disease) (HCC)   COVID-19   Aspiration into airway  Acute on chronic respiratory failure with hypoxia secondary to COVID-19 multifocal pneumonia Patient tested positive for Covid on 02/07/2019.  She initially required up to 20 L of high flow nasal cannula oxygen but after dialysis was weaned down to 10 L but then up to 15 L/min again of high flow oxygen.  PT evaluation ordered recommending SNF but her son and HPOA not wanting this. She has been to [facility] before and her son is open for her placement there (she has been at [facility] before). CM consult for LTACH placement.   -continue with incentive spirometry and flutter valve--patient needs guidance and encouragement to use these devices and has issues  with expectoration, sometimes needing coaching and assistance with deep breathing. -CXR reviewed with Radiologist, indicating atelectasis, small effusion not amenable for thoracentesis. Will repeat CXR in  am.  -Requested RT consult for chest physiotherapy as tolerated.  -Wean off O2 supplementation as tolerated, O2 sats have fluctuated, O2 sats remain stable on 12-15L/min O2 supplementation--which could not be provided per her home equipment. She is on 5L/min via [place] at baseline.  -Possible aspiration on 1/23. Continue Abx peer PCCM. Continue aspiration precautions. SLP has evaluated,patient now on dysphagia 3 diet with assistance.  -She seems to have decrease in expectoration every afternoon between 2-4 pm and may need RT evaluation and treatment at that time.    Left knee pain.  Noted on 1/22, Venous Doppler US ordered, completed on 1/24, showed no DVT.   CTA PE protocol ordered on 1/23 due to worsening respiratory status, showed no PE.     LDH is within normal limits CRP is elevated, pro calcitonin less than 0.5, WBC count within normal limits, MRSA PCR is negative. Chest x-ray on admission showed asymmetric pulmonary opacity compatible with COVID-19 pneumonia  Mild progression in both lower lungs since 02/07/2019. Completed IV Decadron 6 mg daily Completed IV remdesivir She will need to quarantine till 02/27/19. She remains afebrile and wbc count within normal limits.    Hypothyroidism Continue with Synthroid 112 MCG daily. TSH abnormal and high at 14.  As per the son patient missed her Synthroid for more than a week while waiting for her prescription to be filled out.  Recommend outpatient follow-up with PCP for repeat thyroid panel in about 3 weeks.   End-stage renal disease on dialysis Tuesday Thursday and Saturday. Volume management as per dialysis Nephrology following, HD per their recommendations.   Insulin dependent DM;  CBG (last 3)  Recent Labs    02/21/19 2110 02/22/19 0754 02/22/19 1202  GLUCAP 110* 105* 115*     Discontinued lantus as her CBG were in the 60's in the am and after dialysis.  Continue with SSI. Last A1c is 5.6.  Was on D5 infusion briefly  on 1/24 due to NPO status.     Body mass index is 38.24 kg/m. Morbidly obese DVT prophylaxis heparin  7500 TID.     History of COPD, advanced, on 5L/min via  of O2 supplementation at baseline.  No wheezing heard Continue with bronchodilators as needed. Continue Mucinex   Pressure injury on admission.   Pressure Injury 02/13/19 Heel Left Deep Tissue Pressure Injury - Purple or maroon localized area of discolored intact skin or blood-filled blister due to damage of underlying soft tissue from pressure and/or shear. (Active)  02/13/19 1000  Location: Heel  Location Orientation: Left  Staging: Deep Tissue Pressure Injury - Purple or maroon localized area of discolored intact skin or blood-filled blister due to damage of underlying soft tissue from pressure and/or shear.  Wound Description (Comments):   Present on Admission:    Wound care consulted.        Discharge Instructions  Discharge Instructions    Diet - low sodium heart healthy   Complete by: As directed      Allergies as of 02/22/2019      Reactions   Avelox [moxifloxacin Hcl In Nacl] Other (See Comments)   Unknown reaction   Codeine Anaphylaxis   Penicillins Rash   Did it involve swelling of the face/tongue/throat, SOB, or low BP? Unk Did it involve sudden or severe rash/hives, skin peeling, or any reaction on  the inside of your mouth or nose? Yes Did you need to seek medical attention at a hospital or doctor's office? Unk When did it last happen? Unk If all above answers are "NO", may proceed with cephalosporin use.   Sulfa Antibiotics Other (See Comments)   Unknown reaction   Dextromethorphan-guaifenesin Other (See Comments)   Reported by [facility] - unknown reaction. Pt states she is not allergic to dextromethorphan-guaifenesin.   Shellfish Allergy Hives, Itching, Rash      Medication List    STOP taking these medications   albuterol (2.5 MG/3ML) 0.083% nebulizer solution Commonly  known as: PROVENTIL Replaced by: Ventolin HFA 108 (90 Base) MCG/ACT inhaler   cinacalcet 30 MG tablet Commonly known as: SENSIPAR   furosemide 40 MG tablet Commonly known as: LASIX   guaiFENesin-dextromethorphan 100-10 MG/5ML syrup Commonly known as: ROBITUSSIN DM   insulin detemir 100 UNIT/ML injection Commonly known as: LEVEMIR   lidocaine 5 % Commonly known as: LIDODERM   OXYGEN     TAKE these medications   acetaminophen 500 MG tablet Commonly known as: TYLENOL Take 1,000 mg by mouth every 6 (six) hours as needed (for arthritic pain).   ascorbic acid 500 MG tablet Commonly known as: VITAMIN C Take 1 tablet (500 mg total) by mouth daily. Start taking on: February 23, 2019   aztreonam 500 mg in dextrose 5 % 50 mL Inject 500 mg into the vein every 12 (twelve) hours.   benzonatate 200 MG capsule Commonly known as: TESSALON Take 1 capsule (200 mg total) by mouth 2 (two) times daily as needed for cough.   calcitRIOL 0.25 MCG capsule Commonly known as: ROCALTROL Take 7 capsules (1.75 mcg total) by mouth Every Tuesday,Thursday,and Saturday with dialysis.   clonazePAM 0.5 MG tablet Commonly known as: KLONOPIN 0.25mg  in AM and 0.5mg  in PM What changed:   how much to take  how to take this  when to take this   Darbepoetin Alfa 25 MCG/0.42ML Sosy injection Commonly known as: ARANESP Inject 0.42 mLs (25 mcg total) into the vein every Tuesday with hemodialysis.   docusate sodium 100 MG capsule Commonly known as: COLACE Take 100 mg by mouth every morning.   feeding supplement (NEPRO CARB STEADY) Liqd Take 237 mLs by mouth 2 (two) times daily between meals.   FLUoxetine 10 MG tablet Commonly known as: PROZAC Take 1 tablet (10 mg total) by mouth daily.   guaiFENesin 600 MG 12 hr tablet Commonly known as: MUCINEX Take 1 tablet (600 mg total) by mouth 2 (two) times daily.   heparin 5000 UNIT/ML injection Inject 1.5 mLs (7,500 Units total) into the skin every 8  (eight) hours.   insulin lispro 100 UNIT/ML injection Commonly known as: HUMALOG Inject 1-2 Units into the skin See admin instructions. Inject 1-2 units into the skin three times a day before meals, per sliding scale: 1 unit for every 50 points above a BGL of 150   ipratropium-albuterol 0.5-2.5 (3) MG/3ML Soln Commonly known as: DUONEB Take 3 mLs by nebulization See admin instructions. Nebulize and inhale 3 ml's into the lungs every four hours, while awake   K-Y LUBRICANT JELLY SENSITIVE EX Place 1 application into both nostrils as needed (as directed- for lubrication).   levothyroxine 112 MCG tablet Commonly known as: SYNTHROID Take 1 tablet (112 mcg total) by mouth daily before breakfast. 1 AM   metroNIDAZOLE 5-0.79 MG/ML-% IVPB Commonly known as: FLAGYL Inject 100 mLs (500 mg total) into the vein every 8 (eight) hours.  midodrine 10 MG tablet Commonly known as: PROAMATINE Take 10 mg by mouth See admin instructions. Take 10 mg by mouth on Tuesday, Thursday, Saturday 45 minutes before dialysis   montelukast 10 MG tablet Commonly known as: SINGULAIR Take 10 mg by mouth at bedtime.   multivitamin Tabs tablet Take 1 tablet by mouth at bedtime.   NASAL SPRAY SALINE NA Place 1-2 sprays into both nostrils as needed (for congestion- "[employer] Simply Saline" brand).   nystatin powder Commonly known as: MYCOSTATIN/NYSTOP Apply 1 g topically 2 (two) times daily as needed (as directed- to any rashes).   nystatin cream Commonly known as: MYCOSTATIN Apply 1 application topically 2 (two) times daily as needed for dry skin.   OVER THE COUNTER MEDICATION Stool softner otc   oxymetazoline 0.05 % nasal spray Commonly known as: AFRIN Place 1 spray into both nostrils 2 (two) times daily as needed for congestion.   rosuvastatin 40 MG tablet Commonly known as: CRESTOR Take 1 tablet (40 mg total) by mouth at bedtime.   senna 8.6 MG Tabs tablet Commonly known as: SENOKOT Take  1 tablet by mouth every morning.   sevelamer carbonate 800 MG tablet Commonly known as: RENVELA Take 1 tablet (800 mg total) by mouth as needed (with snacks). What changed:   how much to take  when to take this  reasons to take this   Ventolin HFA 108 (90 Base) MCG/ACT inhaler Generic drug: albuterol Inhale 2 puffs into the lungs every 6 (six) hours as needed for wheezing or shortness of breath. Replaces: albuterol (2.5 MG/3ML) 0.083% nebulizer solution       Allergies  Allergen Reactions  . Avelox [Moxifloxacin Hcl In Nacl] Other (See Comments)    Unknown reaction  . Codeine Anaphylaxis  . Penicillins Rash    Did it involve swelling of the face/tongue/throat, SOB, or low BP? Unk Did it involve sudden or severe rash/hives, skin peeling, or any reaction on the inside of your mouth or nose? Yes Did you need to seek medical attention at a hospital or doctor's office? Unk When did it last happen? Unk If all above answers are "NO", may proceed with cephalosporin use.   . Sulfa Antibiotics Other (See Comments)    Unknown reaction  . Dextromethorphan-Guaifenesin Other (See Comments)    Reported by [facility] - unknown reaction. Pt states she is not allergic to dextromethorphan-guaifenesin.  . Shellfish Allergy Hives, Itching and Rash     Consultations:nephrology  Palliative care.    Procedures/Studies: CT ANGIO CHEST PE W OR WO CONTRAST  Result Date: 02/18/2019 CLINICAL DATA:  Dyspnea.  Cardiac arrhythmia.  PE.  COVID positive. EXAM: CT ANGIOGRAPHY CHEST WITH CONTRAST TECHNIQUE: Multidetector CT imaging of the chest was performed using the standard protocol during bolus administration of intravenous contrast. Multiplanar CT image reconstructions and MIPs were obtained to evaluate the vascular anatomy. CONTRAST:  168mL OMNIPAQUE IOHEXOL 350 MG/ML SOLN COMPARISON:  11/16/2018 FINDINGS: Cardiovascular: Contrast injection is sufficient to demonstrate satisfactory opacification of  the pulmonary arteries to the segmental level. There is no pulmonary embolus. The main pulmonary artery is significantly dilated measuring up to approximately 4 cm in diameter. The main right pulmonary artery is dilated measuring approximately 4 cm in diameter. The main left pulmonary artery is dilated measuring approximately 3.4 cm in diameter. There is no CT evidence of acute right heart strain. There are atherosclerotic changes of the visualized thoracic aorta without evidence for thoracic aortic aneurysm. Heart size is enlarged. Advanced coronary  artery calcifications are noted. Mediastinum/Nodes: --mild mediastinal adenopathy is noted, presumably reactive. --No axillary lymphadenopathy. --No supraclavicular lymphadenopathy. --Normal thyroid gland. --The esophagus is unremarkable Lungs/Pleura: Emphysematous changes are noted. There is near complete collapse of the left lower lobe with extensive debris within bronchus intermedius and the right lower lobe bronchus. There is near complete collapse of the right middle lobe. Airspace consolidation is noted in the right upper lobe. There is a small right-sided pleural effusion. Upper Abdomen: No acute abnormality. Musculoskeletal: There is no acute displaced fracture. There are old bilateral rib fractures. Review of the MIP images confirms the above findings. IMPRESSION: 1. No acute pulmonary embolism. 2. Near complete collapse of both the right middle and right lower lobes with extensive endobronchial debris within bronchus intermedius. Findings could be secondary to aspiration in the appropriate clinical setting. 3. Consolidation involving the right upper lobe concerning for pneumonia or aspiration pneumonia. 4. Small right-sided pleural effusion. 5. Significantly dilated pulmonary arteries which can be seen in patients with pulmonary artery hypertension. 6. Cardiomegaly.  Coronary artery disease is noted. Aortic Atherosclerosis (ICD10-I70.0) and Emphysema  (ICD10-J43.9). Electronically Signed   By: Constance Holster M.D.   On: 02/18/2019 19:32   DG CHEST PORT 1 VIEW  Result Date: 02/22/2019 CLINICAL DATA:  Dyspnea, COVID EXAM: PORTABLE CHEST 1 VIEW COMPARISON:  02/17/2019 FINDINGS: Patient is rotated. Right dialysis catheter is unchanged. Right pleural effusion and right basilar atelectasis/consolidation. Similar cardiomediastinal contours. No pneumothorax. IMPRESSION: Persistent right pleural effusion and right basilar atelectasis/consolidation. Electronically Signed   By: Macy Mis M.D.   On: 02/22/2019 09:26   DG CHEST PORT 1 VIEW  Result Date: 02/17/2019 CLINICAL DATA:  COVID-19 positive patient.  Dyspnea. EXAM: PORTABLE CHEST 1 VIEW COMPARISON:  Single-view of the chest 02/12/2019 and 02/09/2019. FINDINGS: Moderate right pleural effusion and basilar airspace disease have worsened. The left lung remains clear. Cardiac silhouette is largely obscured. No pneumothorax. Dialysis catheter noted. IMPRESSION: Moderate right pleural effusion and basilar airspace disease have worsened. No other change. Electronically Signed   By: Inge Rise M.D.   On: 02/17/2019 12:55   DG CHEST PORT 1 VIEW  Result Date: 02/12/2019 CLINICAL DATA:  Followup COVID pneumonia. EXAM: PORTABLE CHEST 1 VIEW COMPARISON:  02/09/2019 FINDINGS: The right IJ dialysis catheter is stable. The heart is normal in size and stable. There is moderate tortuosity and calcification of the thoracic aorta. Improved lung aeration with clearing infiltrates. There is a persistent small right pleural effusion and overlying right lower lobe atelectasis or infiltrate. IMPRESSION: Improving lung aeration with clearing infiltrates. Persistent small right effusion and overlying atelectasis or infiltrate. Electronically Signed   By: Marijo Sanes M.D.   On: 02/12/2019 09:13   DG Chest Portable 1 View  Result Date: 02/09/2019 CLINICAL DATA:  74 year old female positive COVID-19. Shortness of  breath. EXAM: PORTABLE CHEST 1 VIEW COMPARISON:  Portable chest 02/07/2019.  Chest CT 11/16/2018. FINDINGS: Portable AP semi upright view at 1843 hours. Stable right chest dialysis type catheter. Stable lung volumes and mediastinal contours. Asymmetric increased interstitial opacity is now most pronounced at the left lung base. There is also increased streaky medial right lung base opacity. No pneumothorax. No pleural effusion is evident. No acute osseous abnormality identified. IMPRESSION: Asymmetric pulmonary opacity compatible with COVID-19 pneumonia in this setting. Mild progression in both lower lungs since 02/07/2019. Electronically Signed   By: Genevie Ann M.D.   On: 02/09/2019 19:36   DG Chest Port 1 View  Result Date: 02/07/2019 CLINICAL DATA:  Shortness of breath, hypoxemia EXAM: PORTABLE CHEST 1 VIEW COMPARISON:  Portable exam 1332 hours compared to 01/05/2019 FINDINGS: RIGHT jugular line with tip projecting over cavoatrial junction. Normal heart size, mediastinal contours, and pulmonary vascularity. Atherosclerotic calcification aorta. Mild bibasilar atelectasis. Minimal blunting of the RIGHT costophrenic angle by tiny effusion unchanged. Remaining lungs clear. No acute infiltrate or pneumothorax. IMPRESSION: Bibasilar atelectasis and tiny RIGHT pleural effusion. No new abnormalities. Aortic Atherosclerosis (ICD10-I70.0). Electronically Signed   By: Lavonia [name] M.D.   On: 02/07/2019 13:56   VAS [name] LOWER EXTREMITY VENOUS (DVT)  Result Date: 02/20/2019  Lower Venous Study Indications: Swelling, and COVID positive.  Limitations: Body habitus and poor ultrasound/tissue interface. Comparison Study: No prior study. Performing Technologist: Maudry Mayhew MHA, RDMS, RVT, RDCS  Examination Guidelines: A complete evaluation includes B-mode imaging, spectral Doppler, color Doppler, and power Doppler as needed of all accessible portions of each vessel. Bilateral testing is considered an integral part of a  complete examination. Limited examinations for reoccurring indications may be performed as noted.  +---------+---------------+---------+-----------+----------+--------------+ RIGHT    CompressibilityPhasicitySpontaneityPropertiesThrombus Aging +---------+---------------+---------+-----------+----------+--------------+ CFV      Full           No       Yes                                 +---------+---------------+---------+-----------+----------+--------------+ FV Prox  Full                                                        +---------+---------------+---------+-----------+----------+--------------+ FV Mid   Full                                                        +---------+---------------+---------+-----------+----------+--------------+ FV DistalFull                                                        +---------+---------------+---------+-----------+----------+--------------+ PFV      Full                                                        +---------+---------------+---------+-----------+----------+--------------+ POP      Full           No       Yes                                 +---------+---------------+---------+-----------+----------+--------------+   Right Technical Findings: Not visualized segments include right saphenofemoral junction, posterior tibial, and peroneal veins.  +---------+---------------+---------+-----------+----------+--------------+ LEFT     CompressibilityPhasicitySpontaneityPropertiesThrombus Aging +---------+---------------+---------+-----------+----------+--------------+ FV Mid   Full                                                        +---------+---------------+---------+-----------+----------+--------------+  FV DistalFull                                                        +---------+---------------+---------+-----------+----------+--------------+ POP      Full           No       Yes                                  +---------+---------------+---------+-----------+----------+--------------+   Left Technical Findings: Not visualized segments include left saphenofemoral junction, common femoral, profunda femoral, proximal femoral, posterior tibial, and peroneal veins.   Summary: Right: There is no evidence of deep vein thrombosis in the lower extremity. However, portions of this examination were limited- see technologist comments above. No cystic structure found in the popliteal fossa. Left: There is no evidence of deep vein thrombosis in the lower extremity. However, portions of this examination were limited- see technologist comments above. No cystic structure found in the popliteal fossa.  Bilateral lower extremity venous flow is pulsatile, suggestive of possible elevated right heart pressure. *See table(s) above for measurements and observations. Electronically signed by Monica Martinez MD on 02/20/2019 at 4:44:45 PM.    Final       Subjective: Reports generalized aches all over.   Discharge Exam: Vitals:   02/22/19 0758 02/22/19 0839  BP: 126/75   Pulse:  69  Resp: 18   Temp: (!) 97.3 F (36.3 C)   SpO2: 90% 90%   Vitals:   02/21/19 2116 02/22/19 0400 02/22/19 0758 02/22/19 0839  BP: (!) 143/76 125/67 126/75   Pulse: 71 64  69  Resp: 18 16 18    Temp:  98.3 F (36.8 C) (!) 97.3 F (36.3 C)   TempSrc:  Oral Oral   SpO2: 90% 92% 90% 90%  Weight:      Height:        General: Pt is alert, awake, not in acute distress on 15 lit HF oxygen.  Cardiovascular: RRR, S1/S2 +, no rubs, no gallops Respiratory: CTA bilaterally, no wheezing, no rhonchi Abdominal: Soft, NT, ND, bowel sounds + Extremities: no edema, no cyanosis    The results of significant diagnostics from this hospitalization (including imaging, microbiology, ancillary and laboratory) are listed below for reference.     Microbiology: Recent Results (from the past 240 hour(s))  Culture, blood (routine x  2)     Status: None (Preliminary result)   Collection Time: 02/18/19 10:14 PM   Specimen: BLOOD RIGHT HAND  Result Value Ref Range Status   Specimen Description BLOOD RIGHT HAND  Final   Special Requests   Final    BOTTLES DRAWN AEROBIC AND ANAEROBIC Blood Culture adequate volume   Culture   Final    NO GROWTH 4 DAYS Performed at [facility], [place] [address], [place], [place] [address]    Report Status PENDING  Incomplete  Culture, blood (routine x 2)     Status: None (Preliminary result)   Collection Time: 02/18/19 10:20 PM   Specimen: BLOOD LEFT HAND  Result Value Ref Range Status   Specimen Description BLOOD LEFT HAND  Final   Special Requests   Final    BOTTLES DRAWN AEROBIC ONLY Blood Culture results may not be optimal due to  an inadequate volume of blood received in culture bottles   Culture   Final    NO GROWTH 4 DAYS Performed at [facility], [place] [address] [place], [place] [address]    Report Status PENDING  Incomplete     Labs: BNP (last 3 results) Recent Labs    09/09/18 0031 11/10/18 1706 02/09/19 1946  BNP 312.3* 157.1* [id number]*   Basic Metabolic Panel: Recent Labs  Lab 02/16/19 0541 02/17/19 0504 02/20/19 0613  NA 138 134* 139  K 5.6* 3.6 4.0  CL 101 93* 97*  CO2 22 26 26   GLUCOSE 76 82 86  BUN 44* 23 31*  CREATININE 6.48* 4.06* 5.02*  CALCIUM 8.9 8.8* 9.2  PHOS  --  4.8* 5.3*   Liver Function Tests: Recent Labs  Lab 02/17/19 0504 02/20/19 0613  ALBUMIN 3.0* 3.1*   No results for input(s): LIPASE, AMYLASE in the last 168 hours. No results for input(s): AMMONIA in the last 168 hours. CBC: Recent Labs  Lab 02/20/19 0613  WBC 11.1*  NEUTROABS 7.7  HGB 9.6*  HCT 32.0*  MCV 96.4  PLT 173   Cardiac Enzymes: No results for input(s): CKTOTAL, CKMB, CKMBINDEX, TROPONINI in the last 168 hours. BNP: Invalid input(s): POCBNP CBG: Recent Labs  Lab 02/21/19 1152 02/21/19 1701 02/21/19 2110 02/22/19 0754 02/22/19 1202   GLUCAP 139* 91 110* 105* 115*   D-Dimer Recent Labs    02/20/19 0613  DDIMER 1.10*   Hgb A1c No results for input(s): HGBA1C in the last 72 hours. Lipid Profile No results for input(s): CHOL, HDL, LDLCALC, TRIG, CHOLHDL, LDLDIRECT in the last 72 hours. Thyroid function studies No results for input(s): TSH, T4TOTAL, T3FREE, THYROIDAB in the last 72 hours.  Invalid input(s): FREET3 Anemia work up No results for input(s): VITAMINB12, FOLATE, FERRITIN, TIBC, IRON, RETICCTPCT in the last 72 hours. Urinalysis    Component Value Date/Time   COLORURINE YELLOW 04/20/2018 0301   APPEARANCEUR HAZY (A) 04/20/2018 0301   LABSPEC 1.009 04/20/2018 0301   PHURINE 9.0 (H) 04/20/2018 0301   GLUCOSEU 150 (A) 04/20/2018 0301   HGBUR MODERATE (A) 04/20/2018 0301   BILIRUBINUR NEGATIVE 04/20/2018 0301   KETONESUR NEGATIVE 04/20/2018 0301   PROTEINUR 100 (A) 04/20/2018 0301   NITRITE NEGATIVE 04/20/2018 0301   LEUKOCYTESUR NEGATIVE 04/20/2018 0301   Sepsis Labs Invalid input(s): PROCALCITONIN,  WBC,  LACTICIDVEN Microbiology Recent Results (from the past 240 hour(s))  Culture, blood (routine x 2)     Status: None (Preliminary result)   Collection Time: 02/18/19 10:14 PM   Specimen: BLOOD RIGHT HAND  Result Value Ref Range Status   Specimen Description BLOOD RIGHT HAND  Final   Special Requests   Final    BOTTLES DRAWN AEROBIC AND ANAEROBIC Blood Culture adequate volume   Culture   Final    NO GROWTH 4 DAYS Performed at [facility], [place] [address] [place], [place] [address]    Report Status PENDING  Incomplete  Culture, blood (routine x 2)     Status: None (Preliminary result)   Collection Time: 02/18/19 10:20 PM   Specimen: BLOOD LEFT HAND  Result Value Ref Range Status   Specimen Description BLOOD LEFT HAND  Final   Special Requests   Final    BOTTLES DRAWN AEROBIC ONLY Blood Culture results may not be optimal due to an inadequate volume of blood received in culture bottles    Culture   Final    NO GROWTH 4  DAYS Performed at [facility], [place] [address] [place],  [address]    Report Status PENDING  Incomplete     Time coordinating discharge: 36 min  SIGNED:   Hosie Poisson, MD  [facility] 02/22/2019, 2:10 PM

## 2019-02-23 ENCOUNTER — Inpatient Hospital Stay (HOSPITAL_COMMUNITY)
Admission: EM | Admit: 2019-02-23 | Discharge: 2019-03-17 | Disposition: A | Discharge status: Home Health | Payer: Medicare Other | Source: Ambulatory Visit | Attending: Internal Medicine | Admitting: Internal Medicine

## 2019-02-23 ENCOUNTER — Other Ambulatory Visit (HOSPITAL_COMMUNITY): Payer: Medicare Other

## 2019-02-23 DIAGNOSIS — U071 COVID-19: Secondary | ICD-10-CM

## 2019-02-23 DIAGNOSIS — J449 Chronic obstructive pulmonary disease, unspecified: Secondary | ICD-10-CM

## 2019-02-23 DIAGNOSIS — J9621 Acute and chronic respiratory failure with hypoxia: Secondary | ICD-10-CM

## 2019-02-23 DIAGNOSIS — R0902 Hypoxemia: Secondary | ICD-10-CM

## 2019-02-23 DIAGNOSIS — N186 End stage renal disease: Secondary | ICD-10-CM | POA: Diagnosis present

## 2019-02-23 DIAGNOSIS — J1282 Pneumonia due to coronavirus disease 2019: Secondary | ICD-10-CM

## 2019-02-23 DIAGNOSIS — J9690 Respiratory failure, unspecified, unspecified whether with hypoxia or hypercapnia: Secondary | ICD-10-CM

## 2019-02-23 DIAGNOSIS — J90 Pleural effusion, not elsewhere classified: Secondary | ICD-10-CM

## 2019-02-23 LAB — COMPREHENSIVE METABOLIC PANEL
ALT: 13 U/L (ref 0–44)
AST: 12 U/L — ABNORMAL LOW (ref 15–41)
Albumin: 2.9 g/dL — ABNORMAL LOW (ref 3.5–5.0)
Alkaline Phosphatase: 71 U/L (ref 38–126)
Anion gap: 15 (ref 5–15)
BUN: 47 mg/dL — ABNORMAL HIGH (ref 8–23)
CO2: 25 mmol/L (ref 22–32)
Calcium: 9.4 mg/dL (ref 8.9–10.3)
Chloride: 103 mmol/L (ref 98–111)
Creatinine, Ser: 7.78 mg/dL — ABNORMAL HIGH (ref 0.44–1.00)
GFR calc Af Amer: 5 mL/min — ABNORMAL LOW (ref >60)
GFR calc non Af Amer: 5 mL/min — ABNORMAL LOW (ref >60)
Glucose, Bld: 108 mg/dL — ABNORMAL HIGH (ref 70–99)
Potassium: 3.9 mmol/L (ref 3.5–5.1)
Sodium: 143 mmol/L (ref 135–145)
Total Bilirubin: 1.2 mg/dL (ref 0.3–1.2)
Total Protein: 5.5 g/dL — ABNORMAL LOW (ref 6.5–8.1)

## 2019-02-23 LAB — CBC
HCT: 33.7 % — ABNORMAL LOW (ref 36.0–46.0)
Hemoglobin: 10.3 g/dL — ABNORMAL LOW (ref 12.0–15.0)
MCH: 29.3 pg (ref 26.0–34.0)
MCHC: 30.6 g/dL (ref 30.0–36.0)
MCV: 96 fL (ref 80.0–100.0)
Platelets: 163 10*3/uL (ref 150–400)
RBC: 3.51 MIL/uL — ABNORMAL LOW (ref 3.87–5.11)
RDW: 18.3 % — ABNORMAL HIGH (ref 11.5–15.5)
WBC: 11.8 10*3/uL — ABNORMAL HIGH (ref 4.0–10.5)
nRBC: 0 % (ref 0.0–0.2)

## 2019-02-23 LAB — TSH: TSH: 15.479 u[IU]/mL — ABNORMAL HIGH (ref 0.350–4.500)

## 2019-02-23 LAB — CULTURE, BLOOD (ROUTINE X 2)
Culture: NO GROWTH
Culture: NO GROWTH
Special Requests: ADEQUATE

## 2019-02-23 LAB — HEMOGLOBIN A1C
Hgb A1c MFr Bld: 5.9 % — ABNORMAL HIGH (ref 4.8–5.6)
Mean Plasma Glucose: 122.63 mg/dL

## 2019-02-23 NOTE — Consult Note (Signed)
[facility] [place]  Date of Service: 02/23/2019  PULMONARY CRITICAL CARE CONSULT   Monte Hindman  [id number]  DOB: 1945/09/11   DOA: 02/23/2019  Referring Physician: Merton Border, MD  HPI: Joan Mosley is a 74 y.o. female seen for follow up of Acute on Chronic Respiratory Failure.  Patient has multiple medical problems including COPD diabetes mellitus end-stage renal disease on hemodialysis chronic oxygen dependence hypothyroidism TIA presents to the hospital basically for dialysis.  Patient apparently also was tested for COVID-19 and was positive and while patient was getting dialysis patient was noted to be significantly hypoxic and was transferred to the ED.  In the ED saturations were 88% on a nonrebreather patient was switched over to high flow oxygen however still continued to have hypoxia.  It was decided to go ahead and admit the patient for further management.  Apparently nephrology was consulted to see the patient for the dialysis and then it appears after dialysis there was some improvement in her breathing where she was able to be decreased down to 9 L of oxygen.  Also palliative care did see the patient for goals of care and they were talking about doing outpatient palliative care follow-up.  Patient was transferred to our facility for further management currently patient is on 16 L of Oxymizer  Review of Systems:  ROS performed and is unremarkable other than noted above.  Past Medical History:  Diagnosis Date  . Arthritis   . COPD (chronic obstructive pulmonary disease) ([place])   . Diabetes mellitus without complication ([place])   . Diabetic neuropathy ([place])   . 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 Renal disorder    ESRD  . Thyroid disease   . TIA (transient ischemic attack)     Past Surgical History:  Procedure Laterality Date  . ABDOMINAL HYSTERECTOMY    . IR FLUORO GUIDE CV LINE RIGHT  11/14/2018   . RECTOCELE REPAIR    . REPLACEMENT TOTAL KNEE BILATERAL Bilateral 2008  . THYROIDECTOMY     80%  . VAGINAL WOUND CLOSURE / REPAIR      Social History:    reports that she quit smoking about 37 years ago. She has a 35.00 pack-year smoking history. She has never used smokeless tobacco. She reports that she does not drink alcohol or use drugs.  Family History: Non-Contributory to the present illness  Allergies  Allergen Reactions  . Avelox [Moxifloxacin Hcl In Nacl] Other (See Comments)    Unknown reaction  . Codeine Anaphylaxis  . Penicillins Rash    Did it involve swelling of the face/tongue/throat, SOB, or low BP? Unk Did it involve sudden or severe rash/hives, skin peeling, or any reaction on the inside of your mouth or nose? Yes Did you need to seek medical attention at a hospital or doctor's office? Unk When did it last happen? Unk If all above answers are "NO", may proceed with cephalosporin use.   . Sulfa Antibiotics Other (See Comments)    Unknown reaction  . Dextromethorphan-Guaifenesin Other (See Comments)    Reported by [facility] - unknown reaction. Pt states she is not allergic to dextromethorphan-guaifenesin.  . Shellfish Allergy Hives, Itching and Rash    Medications: Reviewed on Rounds  Physical Exam:  Vitals: Temperature 97.6 pulse 76 respiratory rate 20 blood pressure is 146/74 saturations 99%  Ventilator Settings off the ventilator right now on 16 L Oxymizer  . General: Comfortable at this time .  Eyes: Grossly normal lids, irises & conjunctiva . ENT: grossly tongue is normal . Neck: no obvious mass . Cardiovascular: S1-S2 normal no gallop or rub . Respiratory: Coarse breath sounds noted bilaterally . Abdomen: Soft and nontender . Skin: no rash seen on limited exam . Musculoskeletal: not rigid . Psychiatric:unable to assess . Neurologic: no seizure no involuntary movements         Labs on Admission:  Basic Metabolic Panel: Recent Labs  Lab  02/17/19 0504 02/20/19 0613 02/23/19 0518  NA 134* 139 143  K 3.6 4.0 3.9  CL 93* 97* 103  CO2 26 26 25   GLUCOSE 82 86 108*  BUN 23 31* 47*  CREATININE 4.06* 5.02* 7.78*  CALCIUM 8.8* 9.2 9.4  PHOS 4.8* 5.3*  --     Recent Labs  Lab 02/18/19 1828  PHART 7.404  PCO2ART 41.7  PO2ART 75.2*  HCO3 25.7  O2SAT 94.6    Liver Function Tests: Recent Labs  Lab 02/17/19 0504 02/20/19 0613 02/23/19 0518  AST  --   --  12*  ALT  --   --  13  ALKPHOS  --   --  71  BILITOT  --   --  1.2  PROT  --   --  5.5*  ALBUMIN 3.0* 3.1* 2.9*   No results for input(s): LIPASE, AMYLASE in the last 168 hours. No results for input(s): AMMONIA in the last 168 hours.  CBC: Recent Labs  Lab 02/20/19 0613 02/23/19 0518  WBC 11.1* 11.8*  NEUTROABS 7.7  --   HGB 9.6* 10.3*  HCT 32.0* 33.7*  MCV 96.4 96.0  PLT 173 163    Cardiac Enzymes: No results for input(s): CKTOTAL, CKMB, CKMBINDEX, TROPONINI in the last 168 hours.  BNP (last 3 results) Recent Labs    09/09/18 0031 11/10/18 1706 02/09/19 1946  BNP 312.3* 157.1* 273.0*    ProBNP (last 3 results) No results for input(s): PROBNP in the last 8760 hours.   Radiological Exams on Admission: DG CHEST PORT 1 VIEW  Result Date: 02/23/2019 CLINICAL DATA:  Respiratory failure, unable to communicate EXAM: PORTABLE CHEST 1 VIEW COMPARISON:  Radiograph 02/22/2019 FINDINGS: There is a steep left anterior oblique rotation of the patient. A right dual lumen dialysis catheter tip terminates at the level of the right atrium. Nasal cannula and telemetry leads overlie the chest. There is persistent right basilar effusion and atelectasis. Fine reticular opacities throughout the lungs may reflect some interstitial edematous changes superimposed upon emphysema better seen on comparison CT. No pneumothorax. Cardiomediastinal contours are stable from prior accounting for rotation. No acute osseous or soft tissue abnormality. Degenerative changes are  present in the imaged spine and shoulders. IMPRESSION: Stable right basilar effusion and atelectasis. Fine reticular opacities throughout the lungs may reflect some interstitial edematous changes superimposed upon emphysema. Electronically Signed   By: Lovena Le M.D.   On: 02/23/2019 05:55   DG CHEST PORT 1 VIEW  Result Date: 02/22/2019 CLINICAL DATA:  Dyspnea, COVID EXAM: PORTABLE CHEST 1 VIEW COMPARISON:  02/17/2019 FINDINGS: Patient is rotated. Right dialysis catheter is unchanged. Right pleural effusion and right basilar atelectasis/consolidation. Similar cardiomediastinal contours. No pneumothorax. IMPRESSION: Persistent right pleural effusion and right basilar atelectasis/consolidation. Electronically Signed   By: Macy Mis M.D.   On: 02/22/2019 09:26   VAS [name] LOWER EXTREMITY VENOUS (DVT)  Result Date: 02/20/2019  Lower Venous Study Indications: Swelling, and COVID positive.  Limitations: Body habitus and poor ultrasound/tissue interface. Comparison Study: No prior  study. Performing Technologist: Maudry Mayhew MHA, RDMS, RVT, RDCS  Examination Guidelines: A complete evaluation includes B-mode imaging, spectral Doppler, color Doppler, and power Doppler as needed of all accessible portions of each vessel. Bilateral testing is considered an integral part of a complete examination. Limited examinations for reoccurring indications may be performed as noted.  +---------+---------------+---------+-----------+----------+--------------+ RIGHT    CompressibilityPhasicitySpontaneityPropertiesThrombus Aging +---------+---------------+---------+-----------+----------+--------------+ CFV      Full           No       Yes                                 +---------+---------------+---------+-----------+----------+--------------+ FV Prox  Full                                                        +---------+---------------+---------+-----------+----------+--------------+ FV Mid    Full                                                        +---------+---------------+---------+-----------+----------+--------------+ FV DistalFull                                                        +---------+---------------+---------+-----------+----------+--------------+ PFV      Full                                                        +---------+---------------+---------+-----------+----------+--------------+ POP      Full           No       Yes                                 +---------+---------------+---------+-----------+----------+--------------+   Right Technical Findings: Not visualized segments include right saphenofemoral junction, posterior tibial, and peroneal veins.  +---------+---------------+---------+-----------+----------+--------------+ LEFT     CompressibilityPhasicitySpontaneityPropertiesThrombus Aging +---------+---------------+---------+-----------+----------+--------------+ FV Mid   Full                                                        +---------+---------------+---------+-----------+----------+--------------+ FV DistalFull                                                        +---------+---------------+---------+-----------+----------+--------------+ POP      Full           No       Yes                                 +---------+---------------+---------+-----------+----------+--------------+  Left Technical Findings: Not visualized segments include left saphenofemoral junction, common femoral, profunda femoral, proximal femoral, posterior tibial, and peroneal veins.   Summary: Right: There is no evidence of deep vein thrombosis in the lower extremity. However, portions of this examination were limited- see technologist comments above. No cystic structure found in the popliteal fossa. Left: There is no evidence of deep vein thrombosis in the lower extremity. However, portions of this examination were limited- see  technologist comments above. No cystic structure found in the popliteal fossa.  Bilateral lower extremity venous flow is pulsatile, suggestive of possible elevated right heart pressure. *See table(s) above for measurements and observations. Electronically signed by Monica Martinez MD on 02/20/2019 at 4:44:45 PM.    Final     Assessment/Plan Active Problems:   ESRD (end stage renal disease) ([place])   Acute on chronic respiratory failure with hypoxia ([facility])   COPD (chronic obstructive pulmonary disease) ([place])   COVID-19   Pneumonia due to COVID-19 virus   1. Acute on chronic respiratory failure with hypoxia patient had significant decline in the respiratory status requiring high oxygen levels.  Currently patient is on 16 L of oxygen.  Work-up was done extensively in the acute care side without any significant findings.  Chest x-ray revealed a right-sided effusion as well as some atelectasis and consolidation.  We will need to continue to follow closely the x-ray for resolution. 2. COVID-19 virus infection patient tested positive on January 20 we will continue to follow has been received appropriate therapy 3. Pneumonia due to COVID-19 slow to improve patient still has significant hypoxia we will continue with current management and supportive care 4. End-stage renal failure patient is chronically on dialysis will have nephrology see the patient and continue with hemodialysis 5. Severe COPD nebulizers as necessary  I have personally seen and evaluated the patient, evaluated laboratory and imaging results, formulated the assessment and plan and placed orders. The Patient requires high complexity decision making with multiple systems involvement.  Case was discussed on Rounds with the Respiratory Therapy Director and the Respiratory staff Time Spent 51minutes  Tae Vonada A Tajia Szeliga, MD [facility] Pulmonary Critical Care Medicine Sleep Medicine

## 2019-02-24 DIAGNOSIS — J449 Chronic obstructive pulmonary disease, unspecified: Secondary | ICD-10-CM | POA: Diagnosis not present

## 2019-02-24 DIAGNOSIS — J9621 Acute and chronic respiratory failure with hypoxia: Secondary | ICD-10-CM | POA: Diagnosis not present

## 2019-02-24 DIAGNOSIS — U071 COVID-19: Secondary | ICD-10-CM | POA: Diagnosis not present

## 2019-02-24 DIAGNOSIS — N186 End stage renal disease: Secondary | ICD-10-CM | POA: Diagnosis not present

## 2019-02-24 LAB — BLOOD GAS, ARTERIAL
Acid-base deficit: 0.8 mmol/L (ref 0.0–2.0)
Bicarbonate: 24 mmol/L (ref 20.0–28.0)
FIO2: 40
O2 Saturation: 88.4 %
Patient temperature: 37
pCO2 arterial: 44.6 mmHg (ref 32.0–48.0)
pH, Arterial: 7.35 (ref 7.350–7.450)
pO2, Arterial: 63.2 mmHg — ABNORMAL LOW (ref 83.0–108.0)

## 2019-02-24 LAB — CBC
HCT: 38.8 % (ref 36.0–46.0)
Hemoglobin: 11.9 g/dL — ABNORMAL LOW (ref 12.0–15.0)
MCH: 30 pg (ref 26.0–34.0)
MCHC: 30.7 g/dL (ref 30.0–36.0)
MCV: 97.7 fL (ref 80.0–100.0)
Platelets: 142 10*3/uL — ABNORMAL LOW (ref 150–400)
RBC: 3.97 MIL/uL (ref 3.87–5.11)
RDW: 18.6 % — ABNORMAL HIGH (ref 11.5–15.5)
WBC: 13.6 10*3/uL — ABNORMAL HIGH (ref 4.0–10.5)
nRBC: 0.1 % (ref 0.0–0.2)

## 2019-02-24 LAB — RENAL FUNCTION PANEL
Albumin: 3 g/dL — ABNORMAL LOW (ref 3.5–5.0)
Anion gap: 20 — ABNORMAL HIGH (ref 5–15)
BUN: 59 mg/dL — ABNORMAL HIGH (ref 8–23)
CO2: 20 mmol/L — ABNORMAL LOW (ref 22–32)
Calcium: 10 mg/dL (ref 8.9–10.3)
Chloride: 104 mmol/L (ref 98–111)
Creatinine, Ser: 8.24 mg/dL — ABNORMAL HIGH (ref 0.44–1.00)
GFR calc Af Amer: 5 mL/min — ABNORMAL LOW (ref >60)
GFR calc non Af Amer: 4 mL/min — ABNORMAL LOW (ref >60)
Glucose, Bld: 115 mg/dL — ABNORMAL HIGH (ref 70–99)
Phosphorus: 6 mg/dL — ABNORMAL HIGH (ref 2.5–4.6)
Potassium: 4.4 mmol/L (ref 3.5–5.1)
Sodium: 144 mmol/L (ref 135–145)

## 2019-02-24 LAB — HEPATITIS B SURFACE ANTIGEN: Hepatitis B Surface Ag: NONREACTIVE

## 2019-02-24 LAB — HEPATITIS B CORE ANTIBODY, IGM: Hep B C IgM: NONREACTIVE

## 2019-02-24 NOTE — Consult Note (Signed)
[facility] CONSULT NOTE    Date: 02/24/2019                  Patient Name:  Joan Mosley  MRN: [id number]  DOB: 04-19-1945  Age / Sex: 74 y.o., female         PCP: Nche, Charlene Brooke, NP                 Service Requesting Consult: Hospitalist                 Reason for Consult:  Evaluation management of ESRD.            History of Present Illness: Patient is a 74 y.o. female with a PMHx of COPD, diabetes mellitus type 2, ESRD on HD, history of TIA, recent COVID-19 infection who was admitted to [facility] on 02/23/2019 for ongoing care.  Patient recently tested positive for COVID-19 on 02/06/2018.  She is requiring quarantine for dialysis purposes until 02/27/2019.  She has been treated with IV steroids, remdesivir and also received IV vancomycin and aztreonam.  She has a right internal jugular PermCath in place.  As an outpatient she was performing dialysis on a TTS basis.  She will be transitioned to dialysis on MWF while here.  She has history of hypotension that has required treatment with midodrine.  In addition she has been treated with erythropoietin stimulating agents for her anemia of chronic kidney disease.  Her phosphorus has been under good control recently as well.   Medications: Outpatient medications: Medications Prior to Admission  Medication Sig Dispense Refill Last Dose  . acetaminophen (TYLENOL) 500 MG tablet Take 1,000 mg by mouth every 6 (six) hours as needed (for arthritic pain).      Marland Kitchen ascorbic acid (VITAMIN C) 500 MG tablet Take 1 tablet (500 mg total) by mouth daily.     Marland Kitchen aztreonam 500 mg in dextrose 5 % 50 mL Inject 500 mg into the vein every 12 (twelve) hours.     . benzonatate (TESSALON) 200 MG capsule Take 1 capsule (200 mg total) by mouth 2 (two) times daily as needed for cough. 30 capsule 0   . calcitRIOL (ROCALTROL) 0.25 MCG capsule Take 7 capsules (1.75 mcg total) by mouth Every Tuesday,Thursday,and Saturday with dialysis.     Marland Kitchen  clonazePAM (KLONOPIN) 0.5 MG tablet 0.25mg  in AM and 0.5mg  in PM (Patient taking differently: Take 0.25-0.5 mg by mouth 2 (two) times daily. 0.25mg  in AM and 0.5mg  in PM) 45 tablet 0   . Darbepoetin Alfa (ARANESP) 25 MCG/0.42ML SOSY injection Inject 0.42 mLs (25 mcg total) into the vein every Tuesday with hemodialysis. 14.28 mL    . docusate sodium (COLACE) 100 MG capsule Take 100 mg by mouth every morning.     Marland Kitchen FLUoxetine (PROZAC) 10 MG tablet Take 1 tablet (10 mg total) by mouth daily. 90 tablet 3   . guaiFENesin (MUCINEX) 600 MG 12 hr tablet Take 1 tablet (600 mg total) by mouth 2 (two) times daily.     . heparin 5000 UNIT/ML injection Inject 1.5 mLs (7,500 Units total) into the skin every 8 (eight) hours. 1 mL    . insulin lispro (HUMALOG) 100 UNIT/ML injection Inject 1-2 Units into the skin See admin instructions. Inject 1-2 units into the skin three times a day before meals, per sliding scale: 1 unit for every 50 points above a BGL of 150     . ipratropium-albuterol (DUONEB) 0.5-2.5 (3) MG/3ML SOLN  Take 3 mLs by nebulization See admin instructions. Nebulize and inhale 3 ml's into the lungs every four hours, while awake     . levothyroxine (SYNTHROID) 112 MCG tablet Take 1 tablet (112 mcg total) by mouth daily before breakfast. 1 AM 90 tablet 0   . Lubricants (K-Y LUBRICANT JELLY SENSITIVE EX) Place 1 application into both nostrils as needed (as directed- for lubrication).      . metroNIDAZOLE (FLAGYL) 5-0.79 MG/ML-% IVPB Inject 100 mLs (500 mg total) into the vein every 8 (eight) hours. 100 mL    . midodrine (PROAMATINE) 10 MG tablet Take 10 mg by mouth See admin instructions. Take 10 mg by mouth on Tuesday, Thursday, Saturday 45 minutes before dialysis     . montelukast (SINGULAIR) 10 MG tablet Take 10 mg by mouth at bedtime.      . multivitamin (RENA-VIT) TABS tablet Take 1 tablet by mouth at bedtime.  0   . NASAL SPRAY SALINE NA Place 1-2 sprays into both nostrils as needed (for congestion-  "[employer] Simply Saline" brand).     . Nutritional Supplements (FEEDING SUPPLEMENT, NEPRO CARB STEADY,) LIQD Take 237 mLs by mouth 2 (two) times daily between meals.  0   . nystatin (MYCOSTATIN/NYSTOP) powder Apply 1 g topically 2 (two) times daily as needed (as directed- to any rashes).      . nystatin cream (MYCOSTATIN) Apply 1 application topically 2 (two) times daily as needed for dry skin.      Marland Kitchen OVER THE COUNTER MEDICATION Stool softner otc     . oxymetazoline (AFRIN) 0.05 % nasal spray Place 1 spray into both nostrils 2 (two) times daily as needed for congestion.     . rosuvastatin (CRESTOR) 40 MG tablet Take 1 tablet (40 mg total) by mouth at bedtime. 90 tablet 1   . senna (SENOKOT) 8.6 MG TABS tablet Take 1 tablet by mouth every morning.     . sevelamer carbonate (RENVELA) 800 MG tablet Take 1 tablet (800 mg total) by mouth as needed (with snacks).     . VENTOLIN HFA 108 (90 Base) MCG/ACT inhaler Inhale 2 puffs into the lungs every 6 (six) hours as needed for wheezing or shortness of breath. 18 g 2     Current medications: Aztreonam 0.5 g IV every 12 hours, folic acid 2 mg daily, metronidazole 500 mg every 8 hours, atorvastatin 80 mg nightly, Calcitrol 1.75 mcg Tuesday, Thursday, Saturday, clonazepam 0.5 mg nightly, docusate 100 mg daily, famotidine 20 mg twice daily, fluoxetine 10 mg daily, furosemide 40 mg daily, heparin 7500 units every 8 hours, hydroxyurea 500 mg twice daily, levothyroxine 112 mcg daily, midodrine 10 mg 3 times a week, montelukast 10 mg nightly, Mucinex 600 mg twice daily, Nephro-Vite 1 tablet daily, prostat 30 cc twice daily, Renvela 16 mg 3 times daily, vitamin C 500 mg daily  Allergies: Allergies  Allergen Reactions  . Avelox [Moxifloxacin Hcl In Nacl] Other (See Comments)    Unknown reaction  . Codeine Anaphylaxis  . Penicillins Rash    Did it involve swelling of the face/tongue/throat, SOB, or low BP? Unk Did it involve sudden or severe rash/hives, skin  peeling, or any reaction on the inside of your mouth or nose? Yes Did you need to seek medical attention at a hospital or doctor's office? Unk When did it last happen? Unk If all above answers are "NO", may proceed with cephalosporin use.   . Sulfa Antibiotics Other (See Comments)  Unknown reaction  . Dextromethorphan-Guaifenesin Other (See Comments)    Reported by [facility] - unknown reaction. Pt states she is not allergic to dextromethorphan-guaifenesin.  . Shellfish Allergy Hives, Itching and Rash      Past Medical History: Past Medical History:  Diagnosis Date  . Arthritis   . COPD (chronic obstructive pulmonary disease) ([place])   . Diabetes mellitus without complication ([place])   . Diabetic neuropathy ([place])   . 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 Renal disorder    ESRD  . Thyroid disease   . TIA (transient ischemic attack)      Past Surgical History: Past Surgical History:  Procedure Laterality Date  . ABDOMINAL HYSTERECTOMY    . IR FLUORO GUIDE CV LINE RIGHT  11/14/2018  . RECTOCELE REPAIR    . REPLACEMENT TOTAL KNEE BILATERAL Bilateral 2008  . THYROIDECTOMY     80%  . VAGINAL WOUND CLOSURE / REPAIR       Family History: Family History  Problem Relation Age of Onset  . Heart disease Mother   . Hypertension Mother   . Heart disease Father   . Hypertension Father      Social History: Social History   Socioeconomic History  . Marital status: Widowed    Spouse name: Not on file  . Number of children: 5  . Years of education: Not on file  . Highest education level: Not on file  Occupational History    Comment: disabled  Tobacco Use  . Smoking status: Former Smoker    Packs/day: 1.00    Years: 35.00    Pack years: 35.00    Quit date: 03/29/1981    Years since quitting: 37.9  . Smokeless tobacco: Never Used  Substance and Sexual Activity  . Alcohol use: Never  . Drug use: Never  . Sexual activity: Not Currently  Other Topics Concern  .  Not on file  Social History Narrative  . Not on file   Social Determinants of Health   Financial Resource Strain:   . Difficulty of Paying Living Expenses: Not on file  Food Insecurity:   . Worried About Charity fundraiser in the Last Year: Not on file  . Ran Out of Food in the Last Year: Not on file  Transportation Needs:   . Lack of Transportation (Medical): Not on file  . Lack of Transportation (Non-Medical): Not on file  Physical Activity:   . Days of Exercise per Week: Not on file  . Minutes of Exercise per Session: Not on file  Stress:   . Feeling of Stress : Not on file  Social Connections:   . Frequency of Communication with Friends and Family: Not on file  . Frequency of Social Gatherings with Friends and Family: Not on file  . Attends Religious Services: Not on file  . Active Member of Clubs or Organizations: Not on file  . Attends Archivist Meetings: Not on file  . Marital Status: Not on file  Intimate Partner Violence:   . Fear of Current or Ex-Partner: Not on file  . Emotionally Abused: Not on file  . Physically Abused: Not on file  . Sexually Abused: Not on file     Review of Systems: Review of Systems  Constitutional: Positive for malaise/fatigue. Negative for chills and fever.  HENT: Negative for congestion, hearing loss and tinnitus.   Eyes: Negative for blurred vision and double vision.  Respiratory: Positive for shortness of breath. Negative  for cough and sputum production.   Cardiovascular: Negative for chest pain, palpitations and orthopnea.  Gastrointestinal: Negative for diarrhea, nausea and vomiting.  Genitourinary: Negative for dysuria, frequency and urgency.  Musculoskeletal: Negative for myalgias.  Skin: Negative for itching and rash.  Neurological: Positive for focal weakness. Negative for dizziness.  Endo/Heme/Allergies: Negative for polydipsia. Does not bruise/bleed easily.  Psychiatric/Behavioral: Negative for depression. The  patient is not nervous/anxious.      Vital Signs: Temperature 96.7 pulse 91 respirations 27 blood pressure 151/91  Weight trends: There were no vitals filed for this visit.  Physical Exam: General: NAD, laying in bed  Head: Normocephalic, atraumatic.  Eyes: Anicteric, EOMI  Nose: Mucous membranes moist, not inflammed, nonerythematous.  Throat: Oropharynx nonerythematous, no exudate appreciated.   Neck: Supple, trachea midline.  Lungs:  Normal respiratory effort. Scattered rhonchi  Heart: S1S2 no rubs  Abdomen:  BS normoactive. Soft, Nondistended, non-tender.  No masses or organomegaly.  Extremities: trace pretibial edema.  Neurologic: Awake, alert, follows commands  Skin: No visible rashes, scars.    Lab results: Basic Metabolic Panel: Recent Labs  Lab 02/20/19 0613 02/23/19 0518  NA 139 143  K 4.0 3.9  CL 97* 103  CO2 26 25  GLUCOSE 86 108*  BUN 31* 47*  CREATININE 5.02* 7.78*  CALCIUM 9.2 9.4  PHOS 5.3*  --     Liver Function Tests: Recent Labs  Lab 02/20/19 0613 02/23/19 0518  AST  --  12*  ALT  --  13  ALKPHOS  --  71  BILITOT  --  1.2  PROT  --  5.5*  ALBUMIN 3.1* 2.9*   No results for input(s): LIPASE, AMYLASE in the last 168 hours. No results for input(s): AMMONIA in the last 168 hours.  CBC: Recent Labs  Lab 02/20/19 0613 02/23/19 0518  WBC 11.1* 11.8*  NEUTROABS 7.7  --   HGB 9.6* 10.3*  HCT 32.0* 33.7*  MCV 96.4 96.0  PLT 173 163    Cardiac Enzymes: No results for input(s): CKTOTAL, CKMB, CKMBINDEX, TROPONINI in the last 168 hours.  BNP: Invalid input(s): POCBNP  CBG: Recent Labs  Lab 02/21/19 2110 02/22/19 0754 02/22/19 1202 02/22/19 1637 02/22/19 2119  GLUCAP 110* 105* 115* 119* 126*    Microbiology: Results for orders placed or performed during the hospital encounter of 02/09/19  MRSA PCR Screening     Status: None   Collection Time: 02/11/19  1:21 AM   Specimen: Nasal Mucosa; Nasopharyngeal  Result Value Ref  Range Status   MRSA by PCR NEGATIVE NEGATIVE Final    Comment:        The GeneXpert MRSA Assay (FDA approved for NASAL specimens only), is one component of a comprehensive MRSA colonization surveillance program. It is not intended to diagnose MRSA infection nor to guide or monitor treatment for MRSA infections. Performed at [facility], [place] [address], [place], [place] [address]   Culture, blood (routine x 2)     Status: None   Collection Time: 02/18/19 10:14 PM   Specimen: BLOOD RIGHT HAND  Result Value Ref Range Status   Specimen Description BLOOD RIGHT HAND  Final   Special Requests   Final    BOTTLES DRAWN AEROBIC AND ANAEROBIC Blood Culture adequate volume   Culture   Final    NO GROWTH 5 DAYS Performed at [facility], [place] [address] [place], [place] [address]    Report Status 02/23/2019 FINAL  Final  Culture, blood (  routine x 2)     Status: None   Collection Time: 02/18/19 10:20 PM   Specimen: BLOOD LEFT HAND  Result Value Ref Range Status   Specimen Description BLOOD LEFT HAND  Final   Special Requests   Final    BOTTLES DRAWN AEROBIC ONLY Blood Culture results may not be optimal due to an inadequate volume of blood received in culture bottles   Culture   Final    NO GROWTH 5 DAYS Performed at [facility], [place] [address] [place], [place] [address]    Report Status 02/23/2019 FINAL  Final    Coagulation Studies: No results for input(s): LABPROT, INR in the last 72 hours.  Urinalysis: No results for input(s): COLORURINE, LABSPEC, PHURINE, GLUCOSEU, HGBUR, BILIRUBINUR, KETONESUR, PROTEINUR, UROBILINOGEN, NITRITE, LEUKOCYTESUR in the last 72 hours.  Invalid input(s): APPERANCEUR    Imaging: DG CHEST PORT 1 VIEW  Result Date: 02/23/2019 CLINICAL DATA:  Respiratory failure, unable to communicate EXAM: PORTABLE CHEST 1 VIEW COMPARISON:  Radiograph 02/22/2019 FINDINGS: There is a steep left anterior oblique rotation of the patient. A right  dual lumen dialysis catheter tip terminates at the level of the right atrium. Nasal cannula and telemetry leads overlie the chest. There is persistent right basilar effusion and atelectasis. Fine reticular opacities throughout the lungs may reflect some interstitial edematous changes superimposed upon emphysema better seen on comparison CT. No pneumothorax. Cardiomediastinal contours are stable from prior accounting for rotation. No acute osseous or soft tissue abnormality. Degenerative changes are present in the imaged spine and shoulders. IMPRESSION: Stable right basilar effusion and atelectasis. Fine reticular opacities throughout the lungs may reflect some interstitial edematous changes superimposed upon emphysema. Electronically Signed   By: Lovena Le M.D.   On: 02/23/2019 05:55      Assessment & Plan: Pt is a 74 y.o. female  with a PMHx of COPD, diabetes mellitus type 2, ESRD on HD, history of TIA, recent COVID-19 infection who was admitted to [facility] on 02/23/2019 for ongoing care.   1.  ESRD on HD.  We will switch the patient to MWF schedule while here.  Due for dialysis today.  2.  Hypotension.  Maintain the patient on midodrine but change days to MWF.  3.  Anemia of chronic kidney disease.  Hemoglobin 10.3.  Consider starting the patient on Retacrit next week.  4.  Secondary hyperparathyroidism.  Maintain the patient on Renvela.

## 2019-02-24 NOTE — Progress Notes (Signed)
Pulmonary Critical Care Medicine [place]   PULMONARY CRITICAL CARE SERVICE  PROGRESS NOTE  Date of Service: 02/24/2019  Joan Mosley  [id number]  DOB: [birth year]   DOA: 02/23/2019  Referring Physician: Merton Border, MD  HPI: Tinlee Sessum is a 74 y.o. female seen for follow up of Acute on Chronic Respiratory Failure.  Patient is on 12 L Oxymizer oxygen requirements are still significantly elevated.  Medications: Reviewed on Rounds  Physical Exam:  Vitals: Temperature 96.7 pulse 71 respiratory rate 23 blood pressure is 151/71 saturations 97%  Ventilator Settings currently is on 12 L Oxymizer good saturations are noted at this time  . General: Comfortable at this time . Eyes: Grossly normal lids, irises & conjunctiva . ENT: grossly tongue is normal . Neck: no obvious mass . Cardiovascular: S1 S2 normal no gallop . Respiratory: No rhonchi no rales are noted at this time . Abdomen: soft . Skin: no rash seen on limited exam . Musculoskeletal: not rigid . Psychiatric:unable to assess . Neurologic: no seizure no involuntary movements         Lab Data:   Basic Metabolic Panel: Recent Labs  Lab 02/20/19 0613 02/23/19 0518  NA 139 143  K 4.0 3.9  CL 97* 103  CO2 26 25  GLUCOSE 86 108*  BUN 31* 47*  CREATININE 5.02* 7.78*  CALCIUM 9.2 9.4  PHOS 5.3*  --     ABG: Recent Labs  Lab 02/18/19 1828  PHART 7.404  PCO2ART 41.7  PO2ART 75.2*  HCO3 25.7  O2SAT 94.6    Liver Function Tests: Recent Labs  Lab 02/20/19 0613 02/23/19 0518  AST  --  12*  ALT  --  13  ALKPHOS  --  71  BILITOT  --  1.2  PROT  --  5.5*  ALBUMIN 3.1* 2.9*   No results for input(s): LIPASE, AMYLASE in the last 168 hours. No results for input(s): AMMONIA in the last 168 hours.  CBC: Recent Labs  Lab 02/20/19 0613 02/23/19 0518  WBC 11.1* 11.8*  NEUTROABS 7.7  --   HGB 9.6* 10.3*  HCT 32.0* 33.7*  MCV 96.4 96.0  PLT 173 163    Cardiac Enzymes: No  results for input(s): CKTOTAL, CKMB, CKMBINDEX, TROPONINI in the last 168 hours.  BNP (last 3 results) Recent Labs    09/09/18 0031 11/10/18 1706 02/09/19 1946  BNP 312.3* 157.1* 273.0*    ProBNP (last 3 results) No results for input(s): PROBNP in the last 8760 hours.  Radiological Exams: DG CHEST PORT 1 VIEW  Result Date: 02/23/2019 CLINICAL DATA:  Respiratory failure, unable to communicate EXAM: PORTABLE CHEST 1 VIEW COMPARISON:  Radiograph 02/22/2019 FINDINGS: There is a steep left anterior oblique rotation of the patient. A right dual lumen dialysis catheter tip terminates at the level of the right atrium. Nasal cannula and telemetry leads overlie the chest. There is persistent right basilar effusion and atelectasis. Fine reticular opacities throughout the lungs may reflect some interstitial edematous changes superimposed upon emphysema better seen on comparison CT. No pneumothorax. Cardiomediastinal contours are stable from prior accounting for rotation. No acute osseous or soft tissue abnormality. Degenerative changes are present in the imaged spine and shoulders. IMPRESSION: Stable right basilar effusion and atelectasis. Fine reticular opacities throughout the lungs may reflect some interstitial edematous changes superimposed upon emphysema. Electronically Signed   By: Lovena Le M.D.   On: 02/23/2019 05:55    Assessment/Plan Active Problems:   ESRD (end stage renal disease) (  [place])   Acute on chronic respiratory failure with hypoxia (H[facility])   COPD (chronic obstructive pulmonary disease) ([place])   COVID-19   Pneumonia due to COVID-19 virus   1. Acute on chronic respiratory failure hypoxia we will continue with Oxymizer 12 L.  Titrate oxygen as tolerated continue secretion management pulmonary toilet. 2. End-stage renal disease on dialysis previously we will continue to monitor right now is stable 3. COPD severe disease continue with present management 4. COVID-19 virus infection  treated 5. Pneumonia due to COVID-19 treated we will continue with present management   I have personally seen and evaluated the patient, evaluated laboratory and imaging results, formulated the assessment and plan and placed orders. The Patient requires high complexity decision making with multiple systems involvement.  Rounds were done with the Respiratory Therapy Director and Staff therapists and discussed with nursing staff also.  Allyne Gee, MD [facility] Pulmonary Critical Care Medicine Sleep Medicine

## 2019-02-25 DIAGNOSIS — U071 COVID-19: Secondary | ICD-10-CM | POA: Diagnosis not present

## 2019-02-25 DIAGNOSIS — J9621 Acute and chronic respiratory failure with hypoxia: Secondary | ICD-10-CM | POA: Diagnosis not present

## 2019-02-25 DIAGNOSIS — N186 End stage renal disease: Secondary | ICD-10-CM | POA: Diagnosis not present

## 2019-02-25 DIAGNOSIS — J449 Chronic obstructive pulmonary disease, unspecified: Secondary | ICD-10-CM | POA: Diagnosis not present

## 2019-02-25 LAB — BLOOD GAS, ARTERIAL
Acid-base deficit: 0.3 mmol/L (ref 0.0–2.0)
Bicarbonate: 25.5 mmol/L (ref 20.0–28.0)
FIO2: 80
O2 Saturation: 96.3 %
Patient temperature: 35.6
pCO2 arterial: 51.2 mmHg — ABNORMAL HIGH (ref 32.0–48.0)
pH, Arterial: 7.309 — ABNORMAL LOW (ref 7.350–7.450)
pO2, Arterial: 88.9 mmHg (ref 83.0–108.0)

## 2019-02-25 LAB — PTH, INTACT AND CALCIUM
Calcium, Total (PTH): 9.7 mg/dL (ref 8.7–10.3)
PTH: 104 pg/mL — ABNORMAL HIGH (ref 15–65)

## 2019-02-25 LAB — HEPATITIS B SURFACE ANTIBODY, QUANTITATIVE: Hep B S AB Quant (Post): 16.6 m[IU]/mL (ref >9.9)

## 2019-02-25 NOTE — Progress Notes (Addendum)
Pulmonary Critical Care Medicine [place]   PULMONARY CRITICAL CARE SERVICE  PROGRESS NOTE  Date of Service: 02/25/2019  Joan Mosley  [id number]  DOB: 12/27/1945   DOA: 02/23/2019  Referring Physician: Merton Border, MD  HPI: Joan Mosley is a 74 y.o. female seen for follow up of Acute on Chronic Respiratory Failure.  Patient was on heated high flow nasal cannula and ABG was done this ABG was poor and patient was placed back on BiPAP 14/7 FiO2 45%.  Currently satting well with no distress.  Medications: Reviewed on Rounds  Physical Exam:  Vitals: Pulse 99 respirations 28 BP 151/80 O2 sat 100% temp 96.6  Ventilator Settings BiPAP 14/7 FiO2 45%  . General: Comfortable at this time . Eyes: Grossly normal lids, irises & conjunctiva . ENT: grossly tongue is normal . Neck: no obvious mass . Cardiovascular: S1 S2 normal no gallop . Respiratory: No rales or rhonchi noted . Abdomen: soft . Skin: no rash seen on limited exam . Musculoskeletal: not rigid . Psychiatric:unable to assess . Neurologic: no seizure no involuntary movements         Lab Data:   Basic Metabolic Panel: Recent Labs  Lab 02/20/19 0613 02/23/19 0518 02/24/19 1008  NA 139 143 144  K 4.0 3.9 4.4  CL 97* 103 104  CO2 26 25 20*  GLUCOSE 86 108* 115*  BUN 31* 47* 59*  CREATININE 5.02* 7.78* 8.24*  CALCIUM 9.2 9.4 10.0  9.7  PHOS 5.3*  --  6.0*    ABG: Recent Labs  Lab 02/18/19 1828 02/24/19 2312 02/25/19 0855  PHART 7.404 7.350 7.309*  PCO2ART 41.7 44.6 51.2*  PO2ART 75.2* 63.2* 88.9  HCO3 25.7 24.0 25.5  O2SAT 94.6 88.4 96.3    Liver Function Tests: Recent Labs  Lab 02/20/19 0613 02/23/19 0518 02/24/19 1008  AST  --  12*  --   ALT  --  13  --   ALKPHOS  --  71  --   BILITOT  --  1.2  --   PROT  --  5.5*  --   ALBUMIN 3.1* 2.9* 3.0*   No results for input(s): LIPASE, AMYLASE in the last 168 hours. No results for input(s): AMMONIA in the last 168  hours.  CBC: Recent Labs  Lab 02/20/19 0613 02/23/19 0518 02/24/19 1008  WBC 11.1* 11.8* 13.6*  NEUTROABS 7.7  --   --   HGB 9.6* 10.3* 11.9*  HCT 32.0* 33.7* 38.8  MCV 96.4 96.0 97.7  PLT 173 163 142*    Cardiac Enzymes: No results for input(s): CKTOTAL, CKMB, CKMBINDEX, TROPONINI in the last 168 hours.  BNP (last 3 results) Recent Labs    09/09/18 0031 11/10/18 1706 02/09/19 1946  BNP 312.3* 157.1* 273.0*    ProBNP (last 3 results) No results for input(s): PROBNP in the last 8760 hours.  Radiological Exams: No results found.  Assessment/Plan Active Problems:   ESRD (end stage renal disease) (HCC)   Acute on chronic respiratory failure with hypoxia (HCC)   COPD (chronic obstructive pulmonary disease) ([place])   COVID-19   Pneumonia due to COVID-19 virus   1. Acute on chronic respiratory failure hypoxia continue with BiPAP at this time we will continue to attempt to wean back down to the Oxymizer or a topical nasal cannula as patient can tolerate.  Continue supportive measures aggressive pulmonary toilet. 2. End-stage renal disease on dialysis previously we will continue to monitor right now is stable 3. COPD severe  disease continue with present management 4. COVID-19 virus infection treated 5. Pneumonia due to COVID-19 treated we will continue with present management   I have personally seen and evaluated the patient, evaluated laboratory and imaging results, formulated the assessment and plan and placed orders. The Patient requires high complexity decision making with multiple systems involvement.  Rounds were done with the Respiratory Therapy Director and Staff therapists and discussed with nursing staff also.  Allyne Gee, MD [facility] Pulmonary Critical Care Medicine Sleep Medicine

## 2019-02-26 DIAGNOSIS — U071 COVID-19: Secondary | ICD-10-CM | POA: Diagnosis not present

## 2019-02-26 DIAGNOSIS — J449 Chronic obstructive pulmonary disease, unspecified: Secondary | ICD-10-CM | POA: Diagnosis not present

## 2019-02-26 DIAGNOSIS — N186 End stage renal disease: Secondary | ICD-10-CM | POA: Diagnosis not present

## 2019-02-26 DIAGNOSIS — J9621 Acute and chronic respiratory failure with hypoxia: Secondary | ICD-10-CM | POA: Diagnosis not present

## 2019-02-26 LAB — BASIC METABOLIC PANEL
Anion gap: 16 — ABNORMAL HIGH (ref 5–15)
BUN: 40 mg/dL — ABNORMAL HIGH (ref 8–23)
CO2: 22 mmol/L (ref 22–32)
Calcium: 9.7 mg/dL (ref 8.9–10.3)
Chloride: 99 mmol/L (ref 98–111)
Creatinine, Ser: 5.99 mg/dL — ABNORMAL HIGH (ref 0.44–1.00)
GFR calc Af Amer: 7 mL/min — ABNORMAL LOW (ref >60)
GFR calc non Af Amer: 6 mL/min — ABNORMAL LOW (ref >60)
Glucose, Bld: 180 mg/dL — ABNORMAL HIGH (ref 70–99)
Potassium: 4.7 mmol/L (ref 3.5–5.1)
Sodium: 137 mmol/L (ref 135–145)

## 2019-02-26 LAB — CBC
HCT: 34.2 % — ABNORMAL LOW (ref 36.0–46.0)
Hemoglobin: 10.5 g/dL — ABNORMAL LOW (ref 12.0–15.0)
MCH: 29.8 pg (ref 26.0–34.0)
MCHC: 30.7 g/dL (ref 30.0–36.0)
MCV: 97.2 fL (ref 80.0–100.0)
Platelets: 131 10*3/uL — ABNORMAL LOW (ref 150–400)
RBC: 3.52 MIL/uL — ABNORMAL LOW (ref 3.87–5.11)
RDW: 19 % — ABNORMAL HIGH (ref 11.5–15.5)
WBC: 8.3 10*3/uL (ref 4.0–10.5)
nRBC: 0 % (ref 0.0–0.2)

## 2019-02-26 NOTE — Progress Notes (Addendum)
Pulmonary Critical Care Medicine [place]   PULMONARY CRITICAL CARE SERVICE  PROGRESS NOTE  Date of Service: 02/26/2019  Joan Mosley  [id number]  DOB: [birth year]   DOA: 02/23/2019  Referring Physician: Merton Border, MD  HPI: Joan Mosley is a 74 y.o. female seen for follow up of Acute on Chronic Respiratory Failure.  Patient remains on heated high flow nasal cannula at 30 L/min and 60% FiO2 satting well with no distress.  Medications: Reviewed on Rounds  Physical Exam:  Vitals: Pulse 61 respirations 20 BP 144/67 O2 sat 99% temp 98.2  Ventilator Settings heated high flow 30 L 60% FiO2  . General: Comfortable at this time . Eyes: Grossly normal lids, irises & conjunctiva . ENT: grossly tongue is normal . Neck: no obvious mass . Cardiovascular: S1 S2 normal no gallop . Respiratory: No rales or rhonchi noted . Abdomen: soft . Skin: no rash seen on limited exam . Musculoskeletal: not rigid . Psychiatric:unable to assess . Neurologic: no seizure no involuntary movements         Lab Data:   Basic Metabolic Panel: Recent Labs  Lab 02/20/19 0613 02/23/19 0518 02/24/19 1008 02/26/19 0634  NA 139 143 144 137  K 4.0 3.9 4.4 4.7  CL 97* 103 104 99  CO2 26 25 20* 22  GLUCOSE 86 108* 115* 180*  BUN 31* 47* 59* 40*  CREATININE 5.02* 7.78* 8.24* 5.99*  CALCIUM 9.2 9.4 10.0  9.7 9.7  PHOS 5.3*  --  6.0*  --     ABG: Recent Labs  Lab 02/24/19 2312 02/25/19 0855  PHART 7.350 7.309*  PCO2ART 44.6 51.2*  PO2ART 63.2* 88.9  HCO3 24.0 25.5  O2SAT 88.4 96.3    Liver Function Tests: Recent Labs  Lab 02/20/19 0613 02/23/19 0518 02/24/19 1008  AST  --  12*  --   ALT  --  13  --   ALKPHOS  --  71  --   BILITOT  --  1.2  --   PROT  --  5.5*  --   ALBUMIN 3.1* 2.9* 3.0*   No results for input(s): LIPASE, AMYLASE in the last 168 hours. No results for input(s): AMMONIA in the last 168 hours.  CBC: Recent Labs  Lab 02/20/19 0613  02/23/19 0518 02/24/19 1008 02/26/19 0634  WBC 11.1* 11.8* 13.6* 8.3  NEUTROABS 7.7  --   --   --   HGB 9.6* 10.3* 11.9* 10.5*  HCT 32.0* 33.7* 38.8 34.2*  MCV 96.4 96.0 97.7 97.2  PLT 173 163 142* 131*    Cardiac Enzymes: No results for input(s): CKTOTAL, CKMB, CKMBINDEX, TROPONINI in the last 168 hours.  BNP (last 3 results) Recent Labs    09/09/18 0031 11/10/18 1706 02/09/19 1946  BNP 312.3* 157.1* 273.0*    ProBNP (last 3 results) No results for input(s): PROBNP in the last 8760 hours.  Radiological Exams: No results found.  Assessment/Plan Active Problems:   ESRD (end stage renal disease) (HCC)   Acute on chronic respiratory failure with hypoxia (HCC)   COPD (chronic obstructive pulmonary disease) ([place])   COVID-19   Pneumonia due to COVID-19 virus   1. Acute on chronic respiratory failure hypoxia  continue heated high flow nasal cannula time.  Continue to attempt weaning per protocol.  Continue aggressive pulmonary toilet supportive measures. 2. End-stage renal disease on dialysis previously we will continue to monitor right now is stable 3. COPD severe disease continue with present management 4. COVID-19  virus infection treated 5. Pneumonia due to COVID-19 treated we will continue with present management   I have personally seen and evaluated the patient, evaluated laboratory and imaging results, formulated the assessment and plan and placed orders. The Patient requires high complexity decision making with multiple systems involvement.  Rounds were done with the Respiratory Therapy Director and Staff therapists and discussed with nursing staff also.  Allyne Gee, MD [facility] Pulmonary Critical Care Medicine Sleep Medicine

## 2019-02-27 DIAGNOSIS — U071 COVID-19: Secondary | ICD-10-CM | POA: Diagnosis not present

## 2019-02-27 DIAGNOSIS — J449 Chronic obstructive pulmonary disease, unspecified: Secondary | ICD-10-CM | POA: Diagnosis not present

## 2019-02-27 DIAGNOSIS — J9621 Acute and chronic respiratory failure with hypoxia: Secondary | ICD-10-CM | POA: Diagnosis not present

## 2019-02-27 DIAGNOSIS — N186 End stage renal disease: Secondary | ICD-10-CM | POA: Diagnosis not present

## 2019-02-27 LAB — RENAL FUNCTION PANEL
Albumin: 2.7 g/dL — ABNORMAL LOW (ref 3.5–5.0)
Anion gap: 19 — ABNORMAL HIGH (ref 5–15)
BUN: 63 mg/dL — ABNORMAL HIGH (ref 8–23)
CO2: 22 mmol/L (ref 22–32)
Calcium: 10.4 mg/dL — ABNORMAL HIGH (ref 8.9–10.3)
Chloride: 98 mmol/L (ref 98–111)
Creatinine, Ser: 7.2 mg/dL — ABNORMAL HIGH (ref 0.44–1.00)
GFR calc Af Amer: 6 mL/min — ABNORMAL LOW (ref >60)
GFR calc non Af Amer: 5 mL/min — ABNORMAL LOW (ref >60)
Glucose, Bld: 220 mg/dL — ABNORMAL HIGH (ref 70–99)
Phosphorus: 4.7 mg/dL — ABNORMAL HIGH (ref 2.5–4.6)
Potassium: 4.3 mmol/L (ref 3.5–5.1)
Sodium: 139 mmol/L (ref 135–145)

## 2019-02-27 LAB — CBC
HCT: 30.3 % — ABNORMAL LOW (ref 36.0–46.0)
Hemoglobin: 9.3 g/dL — ABNORMAL LOW (ref 12.0–15.0)
MCH: 29.9 pg (ref 26.0–34.0)
MCHC: 30.7 g/dL (ref 30.0–36.0)
MCV: 97.4 fL (ref 80.0–100.0)
Platelets: 140 10*3/uL — ABNORMAL LOW (ref 150–400)
RBC: 3.11 MIL/uL — ABNORMAL LOW (ref 3.87–5.11)
RDW: 19.2 % — ABNORMAL HIGH (ref 11.5–15.5)
WBC: 7.6 10*3/uL (ref 4.0–10.5)
nRBC: 0 % (ref 0.0–0.2)

## 2019-02-27 NOTE — Progress Notes (Signed)
[facility]  ROUNDING NOTE   Subjective:  Patient seen and evaluated at bedside. Patient due for dialysis treatment today. Continues to appear a bit confused.   Objective:  Vital signs in last 24 hours:  Temperature 98.1 pulse 105 respirations 26 blood pressure 159/85  Physical Exam: General: No acute distress  Head: Normocephalic, atraumatic. Moist oral mucosal membranes  Eyes: Anicteric  Neck: Supple, trachea midline  Lungs:  Clear to auscultation, normal effort  Heart: S1S2 no rubs  Abdomen:  Soft, nontender, bowel sounds present  Extremities: Trace peripheral edema.  Neurologic: Awake, alert, following commands  Skin: No lesions  Access: Right IJ PermCath    Basic Metabolic Panel: Recent Labs  Lab 02/23/19 0518 02/23/19 0518 02/24/19 1008 02/26/19 0634 02/27/19 0609  NA 143  --  144 137 139  K 3.9  --  4.4 4.7 4.3  CL 103  --  104 99 98  CO2 25  --  20* 22 22  GLUCOSE 108*  --  115* 180* 220*  BUN 47*  --  59* 40* 63*  CREATININE 7.78*  --  8.24* 5.99* 7.20*  CALCIUM 9.4   < > 10.0  9.7 9.7 10.4*  PHOS  --   --  6.0*  --  4.7*   < > = values in this interval not displayed.    Liver Function Tests: Recent Labs  Lab 02/23/19 0518 02/24/19 1008 02/27/19 0609  AST 12*  --   --   ALT 13  --   --   ALKPHOS 71  --   --   BILITOT 1.2  --   --   PROT 5.5*  --   --   ALBUMIN 2.9* 3.0* 2.7*   No results for input(s): LIPASE, AMYLASE in the last 168 hours. No results for input(s): AMMONIA in the last 168 hours.  CBC: Recent Labs  Lab 02/23/19 0518 02/24/19 1008 02/26/19 0634 02/27/19 0609  WBC 11.8* 13.6* 8.3 7.6  HGB 10.3* 11.9* 10.5* 9.3*  HCT 33.7* 38.8 34.2* 30.3*  MCV 96.0 97.7 97.2 97.4  PLT 163 142* 131* 140*    Cardiac Enzymes: No results for input(s): CKTOTAL, CKMB, CKMBINDEX, TROPONINI in the last 168 hours.  BNP: Invalid input(s): POCBNP  CBG: Recent Labs  Lab 02/21/19 2110 02/22/19 0754 02/22/19 1202  02/22/19 1637 02/22/19 2119  GLUCAP 110* 105* 115* 119* 126*    Microbiology: Results for orders placed or performed during the hospital encounter of 02/09/19  MRSA PCR Screening     Status: None   Collection Time: 02/11/19  1:21 AM   Specimen: Nasal Mucosa; Nasopharyngeal  Result Value Ref Range Status   MRSA by PCR NEGATIVE NEGATIVE Final    Comment:        The GeneXpert MRSA Assay (FDA approved for NASAL specimens only), is one component of a comprehensive MRSA colonization surveillance program. It is not intended to diagnose MRSA infection nor to guide or monitor treatment for MRSA infections. Performed at [facility], [place] [address] [place], [place] [address]   Culture, blood (routine x 2)     Status: None   Collection Time: 02/18/19 10:14 PM   Specimen: BLOOD RIGHT HAND  Result Value Ref Range Status   Specimen Description BLOOD RIGHT HAND  Final   Special Requests   Final    BOTTLES DRAWN AEROBIC AND ANAEROBIC Blood Culture adequate volume   Culture   Final    NO GROWTH 5 DAYS Performed  at [facility], [place] [address], [place], [place] [address]    Report Status 02/23/2019 FINAL  Final  Culture, blood (routine x 2)     Status: None   Collection Time: 02/18/19 10:20 PM   Specimen: BLOOD LEFT HAND  Result Value Ref Range Status   Specimen Description BLOOD LEFT HAND  Final   Special Requests   Final    BOTTLES DRAWN AEROBIC ONLY Blood Culture results may not be optimal due to an inadequate volume of blood received in culture bottles   Culture   Final    NO GROWTH 5 DAYS Performed at [facility], [place] [address] [place], [place] [address]    Report Status 02/23/2019 FINAL  Final    Coagulation Studies: No results for input(s): LABPROT, INR in the last 72 hours.  Urinalysis: No results for input(s): COLORURINE, LABSPEC, PHURINE, GLUCOSEU, HGBUR, BILIRUBINUR, KETONESUR, PROTEINUR, UROBILINOGEN, NITRITE, LEUKOCYTESUR in the last 72  hours.  Invalid input(s): APPERANCEUR    Imaging: No results found.   Medications:       Assessment/ Plan:  74 y.o. female with a PMHx of COPD, diabetes mellitus type 2, ESRD on HD, history of TIA, recent COVID-19 infection who was admitted to [facility] on 02/23/2019 for ongoing care.   1.  ESRD on HD.  Patient due for hemodialysis today.  We have prepared orders.  Continue dialysis on MWF schedule.  2.  Hypotension.  Maintain the patient on midodrine.  May need to consider using albumin as well.  3.  Anemia of chronic kidney disease.  Hemoglobin 9.3.  Patient has been started on Retacrit.  4.  Secondary hyperparathyroidism.  Calcium noted to be a bit higher.  Continue to monitor closely.  May need to adjust calcitriol dose down if calcium remains high.   LOS: 0 Joan Mosley 2/1/20218:27 AM

## 2019-02-27 NOTE — Progress Notes (Addendum)
Pulmonary Critical Care Medicine [place]   PULMONARY CRITICAL CARE SERVICE  PROGRESS NOTE  Date of Service: 02/27/2019  Joan Mosley  [id number]  DOB: 1945-06-19   DOA: 02/23/2019  Referring Physician: Merton Border, MD  HPI: Joan Mosley is a 74 y.o. female seen for follow up of Acute on Chronic Respiratory Failure.  Patient at this time is on high flow nasal cannula requiring 60% FiO2 trying to be weaned slowly  Medications: Reviewed on Rounds  Physical Exam:  Vitals: Temperature 98.1 pulse 105 respiratory rate 26 blood pressure is 154/85 saturations 94%  Ventilator Settings high flow nasal cannula FiO2 60% with 20 L flow  . General: Comfortable at this time . Eyes: Grossly normal lids, irises & conjunctiva . ENT: grossly tongue is normal . Neck: no obvious mass . Cardiovascular: S1 S2 normal no gallop . Respiratory: Scattered rhonchi expansion is equal . Abdomen: soft . Skin: no rash seen on limited exam . Musculoskeletal: not rigid . Psychiatric:unable to assess . Neurologic: no seizure no involuntary movements         Lab Data:   Basic Metabolic Panel: Recent Labs  Lab 02/23/19 0518 02/24/19 1008 02/26/19 0634 02/27/19 0609  NA 143 144 137 139  K 3.9 4.4 4.7 4.3  CL 103 104 99 98  CO2 25 20* 22 22  GLUCOSE 108* 115* 180* 220*  BUN 47* 59* 40* 63*  CREATININE 7.78* 8.24* 5.99* 7.20*  CALCIUM 9.4 10.0  9.7 9.7 10.4*  PHOS  --  6.0*  --  4.7*    ABG: Recent Labs  Lab 02/24/19 2312 02/25/19 0855  PHART 7.350 7.309*  PCO2ART 44.6 51.2*  PO2ART 63.2* 88.9  HCO3 24.0 25.5  O2SAT 88.4 96.3    Liver Function Tests: Recent Labs  Lab 02/23/19 0518 02/24/19 1008 02/27/19 0609  AST 12*  --   --   ALT 13  --   --   ALKPHOS 71  --   --   BILITOT 1.2  --   --   PROT 5.5*  --   --   ALBUMIN 2.9* 3.0* 2.7*   No results for input(s): LIPASE, AMYLASE in the last 168 hours. No results for input(s): AMMONIA in the last 168  hours.  CBC: Recent Labs  Lab 02/23/19 0518 02/24/19 1008 02/26/19 0634 02/27/19 0609  WBC 11.8* 13.6* 8.3 7.6  HGB 10.3* 11.9* 10.5* 9.3*  HCT 33.7* 38.8 34.2* 30.3*  MCV 96.0 97.7 97.2 97.4  PLT 163 142* 131* 140*    Cardiac Enzymes: No results for input(s): CKTOTAL, CKMB, CKMBINDEX, TROPONINI in the last 168 hours.  BNP (last 3 results) Recent Labs    09/09/18 0031 11/10/18 1706 02/09/19 1946  BNP 312.3* 157.1* 273.0*    ProBNP (last 3 results) No results for input(s): PROBNP in the last 8760 hours.  Radiological Exams: No results found.  Assessment/Plan Active Problems:   ESRD (end stage renal disease) (HCC)   Acute on chronic respiratory failure with hypoxia (HCC)   COPD (chronic obstructive pulmonary disease) ([place])   COVID-19   Pneumonia due to COVID-19 virus   1. Acute on chronic respiratory failure with hypoxia plan is to continue on high flow oxygen titrate down as tolerated 2. End-stage renal disease on hemodialysis followed by nephrology 3. COPD nebulizers as needed 4. COVID-19 virus infection in resolution phase 5. Pneumonia due to COVID-19 slow improvement still with high oxygen requirements   I have personally seen and evaluated the patient,  evaluated laboratory and imaging results, formulated the assessment and plan and placed orders. The Patient requires high complexity decision making with multiple systems involvement.  Rounds were done with the Respiratory Therapy Director and Staff therapists and discussed with nursing staff also.  Allyne Gee, MD [facility] Pulmonary Critical Care Medicine Sleep Medicine

## 2019-02-28 DIAGNOSIS — J9621 Acute and chronic respiratory failure with hypoxia: Secondary | ICD-10-CM | POA: Diagnosis not present

## 2019-02-28 DIAGNOSIS — U071 COVID-19: Secondary | ICD-10-CM | POA: Diagnosis not present

## 2019-02-28 DIAGNOSIS — J449 Chronic obstructive pulmonary disease, unspecified: Secondary | ICD-10-CM | POA: Diagnosis not present

## 2019-02-28 DIAGNOSIS — N186 End stage renal disease: Secondary | ICD-10-CM | POA: Diagnosis not present

## 2019-02-28 NOTE — Progress Notes (Addendum)
Pulmonary Critical Care Medicine [place]   PULMONARY CRITICAL CARE SERVICE  PROGRESS NOTE  Date of Service: 02/28/2019  Joan Mosley  [id number]  DOB: 1946-01-03   DOA: 02/23/2019  Referring Physician: Merton Border, MD  HPI: Joan Mosley is a 74 y.o. female seen for follow up of Acute on Chronic Respiratory Failure.  Patient remains on high flow nasal cannula gradually weaning has been on 60%  Medications: Reviewed on Rounds  Physical Exam:  Vitals: Temperature 98.2 pulse 100 respiratory rate 29 blood pressure 150/70 saturations 93%  Ventilator Settings currently off the ventilator on high flow  . General: Comfortable at this time . Eyes: Grossly normal lids, irises & conjunctiva . ENT: grossly tongue is normal . Neck: no obvious mass . Cardiovascular: S1 S2 normal no gallop . Respiratory: Coarse breath sounds noted bilaterally . Abdomen: soft . Skin: no rash seen on limited exam . Musculoskeletal: not rigid . Psychiatric:unable to assess . Neurologic: no seizure no involuntary movements         Lab Data:   Basic Metabolic Panel: Recent Labs  Lab 02/23/19 0518 02/24/19 1008 02/26/19 0634 02/27/19 0609  NA 143 144 137 139  K 3.9 4.4 4.7 4.3  CL 103 104 99 98  CO2 25 20* 22 22  GLUCOSE 108* 115* 180* 220*  BUN 47* 59* 40* 63*  CREATININE 7.78* 8.24* 5.99* 7.20*  CALCIUM 9.4 10.0  9.7 9.7 10.4*  PHOS  --  6.0*  --  4.7*    ABG: Recent Labs  Lab 02/24/19 2312 02/25/19 0855  PHART 7.350 7.309*  PCO2ART 44.6 51.2*  PO2ART 63.2* 88.9  HCO3 24.0 25.5  O2SAT 88.4 96.3    Liver Function Tests: Recent Labs  Lab 02/23/19 0518 02/24/19 1008 02/27/19 0609  AST 12*  --   --   ALT 13  --   --   ALKPHOS 71  --   --   BILITOT 1.2  --   --   PROT 5.5*  --   --   ALBUMIN 2.9* 3.0* 2.7*   No results for input(s): LIPASE, AMYLASE in the last 168 hours. No results for input(s): AMMONIA in the last 168 hours.  CBC: Recent Labs   Lab 02/23/19 0518 02/24/19 1008 02/26/19 0634 02/27/19 0609  WBC 11.8* 13.6* 8.3 7.6  HGB 10.3* 11.9* 10.5* 9.3*  HCT 33.7* 38.8 34.2* 30.3*  MCV 96.0 97.7 97.2 97.4  PLT 163 142* 131* 140*    Cardiac Enzymes: No results for input(s): CKTOTAL, CKMB, CKMBINDEX, TROPONINI in the last 168 hours.  BNP (last 3 results) Recent Labs    09/09/18 0031 11/10/18 1706 02/09/19 1946  BNP 312.3* 157.1* 273.0*    ProBNP (last 3 results) No results for input(s): PROBNP in the last 8760 hours.  Radiological Exams: No results found.  Assessment/Plan Active Problems:   ESRD (end stage renal disease) (HCC)   Acute on chronic respiratory failure with hypoxia (HCC)   COPD (chronic obstructive pulmonary disease) ([place])   COVID-19   Pneumonia due to COVID-19 virus   1. Acute on chronic respiratory failure hypoxia weaning FiO2 down as tolerated we will continue to do so 2. End-stage renal disease on hemodialysis followed by nephrology 3. COVID-19 pneumonia treated 4. COVID-19 virus infection in resolution phase 5. COPD severe disease nebulizers as necessary we will continue with supportive care   I have personally seen and evaluated the patient, evaluated laboratory and imaging results, formulated the assessment and plan and  placed orders. The Patient requires high complexity decision making with multiple systems involvement.  Rounds were done with the Respiratory Therapy Director and Staff therapists and discussed with nursing staff also.  Allyne Gee, MD [facility] Pulmonary Critical Care Medicine Sleep Medicine

## 2019-03-01 DIAGNOSIS — U071 COVID-19: Secondary | ICD-10-CM | POA: Diagnosis not present

## 2019-03-01 DIAGNOSIS — J449 Chronic obstructive pulmonary disease, unspecified: Secondary | ICD-10-CM | POA: Diagnosis not present

## 2019-03-01 DIAGNOSIS — N186 End stage renal disease: Secondary | ICD-10-CM | POA: Diagnosis not present

## 2019-03-01 DIAGNOSIS — J9621 Acute and chronic respiratory failure with hypoxia: Secondary | ICD-10-CM | POA: Diagnosis not present

## 2019-03-01 LAB — RENAL FUNCTION PANEL
Albumin: 2.8 g/dL — ABNORMAL LOW (ref 3.5–5.0)
Anion gap: 15 (ref 5–15)
BUN: 66 mg/dL — ABNORMAL HIGH (ref 8–23)
CO2: 25 mmol/L (ref 22–32)
Calcium: 9.4 mg/dL (ref 8.9–10.3)
Chloride: 97 mmol/L — ABNORMAL LOW (ref 98–111)
Creatinine, Ser: 4.71 mg/dL — ABNORMAL HIGH (ref 0.44–1.00)
GFR calc Af Amer: 10 mL/min — ABNORMAL LOW (ref >60)
GFR calc non Af Amer: 8 mL/min — ABNORMAL LOW (ref >60)
Glucose, Bld: 264 mg/dL — ABNORMAL HIGH (ref 70–99)
Phosphorus: 7 mg/dL — ABNORMAL HIGH (ref 2.5–4.6)
Potassium: 4.6 mmol/L (ref 3.5–5.1)
Sodium: 137 mmol/L (ref 135–145)

## 2019-03-01 LAB — CBC
HCT: 31.9 % — ABNORMAL LOW (ref 36.0–46.0)
Hemoglobin: 9.6 g/dL — ABNORMAL LOW (ref 12.0–15.0)
MCH: 30 pg (ref 26.0–34.0)
MCHC: 30.1 g/dL (ref 30.0–36.0)
MCV: 99.7 fL (ref 80.0–100.0)
Platelets: 139 10*3/uL — ABNORMAL LOW (ref 150–400)
RBC: 3.2 MIL/uL — ABNORMAL LOW (ref 3.87–5.11)
RDW: 19.8 % — ABNORMAL HIGH (ref 11.5–15.5)
WBC: 7 10*3/uL (ref 4.0–10.5)
nRBC: 0 % (ref 0.0–0.2)

## 2019-03-01 NOTE — Progress Notes (Signed)
[facility]  ROUNDING NOTE   Subjective:  Patient now off of COVID-19 precautions. Resting comfortably in bed at the moment.    Objective:  Vital signs in last 24 hours:  Temperature 98.1 pulse 105 respirations 26 blood pressure 159/85  Physical Exam: General: No acute distress  Head: Normocephalic, atraumatic. Moist oral mucosal membranes  Eyes: Anicteric  Neck: Supple, trachea midline  Lungs:  Clear to auscultation, normal effort  Heart: S1S2 no rubs  Abdomen:  Soft, nontender, bowel sounds present  Extremities: Trace peripheral edema.  Neurologic: Awake, alert, following commands  Skin: No lesions  Access: Right IJ PermCath    Basic Metabolic Panel: Recent Labs  Lab 02/23/19 0518 02/23/19 0518 02/24/19 1008 02/26/19 0634 02/27/19 0609  NA 143  --  144 137 139  K 3.9  --  4.4 4.7 4.3  CL 103  --  104 99 98  CO2 25  --  20* 22 22  GLUCOSE 108*  --  115* 180* 220*  BUN 47*  --  59* 40* 63*  CREATININE 7.78*  --  8.24* 5.99* 7.20*  CALCIUM 9.4   < > 10.0  9.7 9.7 10.4*  PHOS  --   --  6.0*  --  4.7*   < > = values in this interval not displayed.    Liver Function Tests: Recent Labs  Lab 02/23/19 0518 02/24/19 1008 02/27/19 0609  AST 12*  --   --   ALT 13  --   --   ALKPHOS 71  --   --   BILITOT 1.2  --   --   PROT 5.5*  --   --   ALBUMIN 2.9* 3.0* 2.7*   No results for input(s): LIPASE, AMYLASE in the last 168 hours. No results for input(s): AMMONIA in the last 168 hours.  CBC: Recent Labs  Lab 02/23/19 0518 02/24/19 1008 02/26/19 0634 02/27/19 0609  WBC 11.8* 13.6* 8.3 7.6  HGB 10.3* 11.9* 10.5* 9.3*  HCT 33.7* 38.8 34.2* 30.3*  MCV 96.0 97.7 97.2 97.4  PLT 163 142* 131* 140*    Cardiac Enzymes: No results for input(s): CKTOTAL, CKMB, CKMBINDEX, TROPONINI in the last 168 hours.  BNP: Invalid input(s): POCBNP  CBG: Recent Labs  Lab 02/22/19 1202 02/22/19 1637 02/22/19 2119  GLUCAP 115* 119* 126*     Microbiology: Results for orders placed or performed during the hospital encounter of 02/09/19  MRSA PCR Screening     Status: None   Collection Time: 02/11/19  1:21 AM   Specimen: Nasal Mucosa; Nasopharyngeal  Result Value Ref Range Status   MRSA by PCR NEGATIVE NEGATIVE Final    Comment:        The GeneXpert MRSA Assay (FDA approved for NASAL specimens only), is one component of a comprehensive MRSA colonization surveillance program. It is not intended to diagnose MRSA infection nor to guide or monitor treatment for MRSA infections. Performed at [facility], [place] [address] [place], [place] [address]   Culture, blood (routine x 2)     Status: None   Collection Time: 02/18/19 10:14 PM   Specimen: BLOOD RIGHT HAND  Result Value Ref Range Status   Specimen Description BLOOD RIGHT HAND  Final   Special Requests   Final    BOTTLES DRAWN AEROBIC AND ANAEROBIC Blood Culture adequate volume   Culture   Final    NO GROWTH 5 DAYS Performed at [facility], [place],  [place] [address]    Report Status 02/23/2019 FINAL  Final  Culture, blood (routine x 2)     Status: None   Collection Time: 02/18/19 10:20 PM   Specimen: BLOOD LEFT HAND  Result Value Ref Range Status   Specimen Description BLOOD LEFT HAND  Final   Special Requests   Final    BOTTLES DRAWN AEROBIC ONLY Blood Culture results may not be optimal due to an inadequate volume of blood received in culture bottles   Culture   Final    NO GROWTH 5 DAYS Performed at [facility], [place] [address], [place],  [address]    Report Status 02/23/2019 FINAL  Final    Coagulation Studies: No results for input(s): LABPROT, INR in the last 72 hours.  Urinalysis: No results for input(s): COLORURINE, LABSPEC, PHURINE, GLUCOSEU, HGBUR, BILIRUBINUR, KETONESUR, PROTEINUR, UROBILINOGEN, NITRITE, LEUKOCYTESUR in the last 72 hours.  Invalid input(s): APPERANCEUR    Imaging: No results  found.   Medications:       Assessment/ Plan:  74 y.o. female with a PMHx of COPD, diabetes mellitus type 2, ESRD on HD, history of TIA, recent COVID-19 infection who was admitted to [facility] on 02/23/2019 for ongoing care.   1.  ESRD on HD.  Patient due for dialysis treatment today.  Orders have been prepared.  Continue MWF schedule.  2.  Hypotension.  Continue the patient on midodrine at this time.  3.  Anemia of chronic kidney disease.  Maintain the patient on Retacrit.  Hemoglobin 9.3 at last check..  4.  Secondary hyperparathyroidism.  Repeat serum calcium today.  Phosphorus 4.7 at last check.   LOS: 0 Jhamari Markowicz 2/3/202110:43 AM

## 2019-03-01 NOTE — Progress Notes (Signed)
Pulmonary Critical Care Medicine [place]   PULMONARY CRITICAL CARE SERVICE  PROGRESS NOTE  Date of Service: 03/01/2019  Joan Mosley  [id number]  DOB: 01-26-1946   DOA: 02/23/2019  Referring Physician: Merton Border, MD  HPI: Joan Mosley is a 74 y.o. female seen for follow up of Acute on Chronic Respiratory Failure.  Patient is currently on high flow nasal cannula has been on 30 L and 50% FiO2  Medications: Reviewed on Rounds  Physical Exam:  Vitals: Temperature is 96.5 pulse 71 respiratory 18 blood pressure is 127/69 saturations 99%  Ventilator Settings high flow nasal cannula 30 L FiO2 50%  . General: Comfortable at this time . Eyes: Grossly normal lids, irises & conjunctiva . ENT: grossly tongue is normal . Neck: no obvious mass . Cardiovascular: S1 S2 normal no gallop . Respiratory: No rhonchi no rales are noted at this time . Abdomen: soft . Skin: no rash seen on limited exam . Musculoskeletal: not rigid . Psychiatric:unable to assess . Neurologic: no seizure no involuntary movements         Lab Data:   Basic Metabolic Panel: Recent Labs  Lab 02/23/19 0518 02/24/19 1008 02/26/19 0634 02/27/19 0609 03/01/19 1028  NA 143 144 137 139 137  K 3.9 4.4 4.7 4.3 4.6  CL 103 104 99 98 97*  CO2 25 20* 22 22 25   GLUCOSE 108* 115* 180* 220* 264*  BUN 47* 59* 40* 63* 66*  CREATININE 7.78* 8.24* 5.99* 7.20* 4.71*  CALCIUM 9.4 10.0  9.7 9.7 10.4* 9.4  PHOS  --  6.0*  --  4.7* 7.0*    ABG: Recent Labs  Lab 02/24/19 2312 02/25/19 0855  PHART 7.350 7.309*  PCO2ART 44.6 51.2*  PO2ART 63.2* 88.9  HCO3 24.0 25.5  O2SAT 88.4 96.3    Liver Function Tests: Recent Labs  Lab 02/23/19 0518 02/24/19 1008 02/27/19 0609 03/01/19 1028  AST 12*  --   --   --   ALT 13  --   --   --   ALKPHOS 71  --   --   --   BILITOT 1.2  --   --   --   PROT 5.5*  --   --   --   ALBUMIN 2.9* 3.0* 2.7* 2.8*   No results for input(s): LIPASE, AMYLASE  in the last 168 hours. No results for input(s): AMMONIA in the last 168 hours.  CBC: Recent Labs  Lab 02/23/19 0518 02/24/19 1008 02/26/19 0634 02/27/19 0609 03/01/19 1028  WBC 11.8* 13.6* 8.3 7.6 7.0  HGB 10.3* 11.9* 10.5* 9.3* 9.6*  HCT 33.7* 38.8 34.2* 30.3* 31.9*  MCV 96.0 97.7 97.2 97.4 99.7  PLT 163 142* 131* 140* 139*    Cardiac Enzymes: No results for input(s): CKTOTAL, CKMB, CKMBINDEX, TROPONINI in the last 168 hours.  BNP (last 3 results) Recent Labs    09/09/18 0031 11/10/18 1706 02/09/19 1946  BNP 312.3* 157.1* 273.0*    ProBNP (last 3 results) No results for input(s): PROBNP in the last 8760 hours.  Radiological Exams: No results found.  Assessment/Plan Active Problems:   ESRD (end stage renal disease) (HCC)   Acute on chronic respiratory failure with hypoxia (HCC)   COPD (chronic obstructive pulmonary disease) ([place])   COVID-19   Pneumonia due to COVID-19 virus   1. Acute on chronic respiratory failure hypoxia continue with weaning FiO2 down as tolerated 2. COVID-19 virus infection treated resolving 3. COPD severe disease continue present management  4. Pneumonia due to COVID-19 treated and clinically is improving 5. End-stage renal disease followed by nephrology for dialysis   I have personally seen and evaluated the patient, evaluated laboratory and imaging results, formulated the assessment and plan and placed orders. The Patient requires high complexity decision making with multiple systems involvement.  Rounds were done with the Respiratory Therapy Director and Staff therapists and discussed with nursing staff also.  Allyne Gee, MD [facility] Pulmonary Critical Care Medicine Sleep Medicine

## 2019-03-03 LAB — RENAL FUNCTION PANEL
Albumin: 2.9 g/dL — ABNORMAL LOW (ref 3.5–5.0)
Anion gap: 15 (ref 5–15)
BUN: 68 mg/dL — ABNORMAL HIGH (ref 8–23)
CO2: 22 mmol/L (ref 22–32)
Calcium: 9.8 mg/dL (ref 8.9–10.3)
Chloride: 98 mmol/L (ref 98–111)
Creatinine, Ser: 4.38 mg/dL — ABNORMAL HIGH (ref 0.44–1.00)
GFR calc Af Amer: 11 mL/min — ABNORMAL LOW (ref >60)
GFR calc non Af Amer: 9 mL/min — ABNORMAL LOW (ref >60)
Glucose, Bld: 176 mg/dL — ABNORMAL HIGH (ref 70–99)
Phosphorus: 6.2 mg/dL — ABNORMAL HIGH (ref 2.5–4.6)
Potassium: 5.4 mmol/L — ABNORMAL HIGH (ref 3.5–5.1)
Sodium: 135 mmol/L (ref 135–145)

## 2019-03-03 LAB — CBC
HCT: 30.8 % — ABNORMAL LOW (ref 36.0–46.0)
Hemoglobin: 9.6 g/dL — ABNORMAL LOW (ref 12.0–15.0)
MCH: 30.6 pg (ref 26.0–34.0)
MCHC: 31.2 g/dL (ref 30.0–36.0)
MCV: 98.1 fL (ref 80.0–100.0)
Platelets: 114 10*3/uL — ABNORMAL LOW (ref 150–400)
RBC: 3.14 MIL/uL — ABNORMAL LOW (ref 3.87–5.11)
RDW: 19.9 % — ABNORMAL HIGH (ref 11.5–15.5)
WBC: 9.6 10*3/uL (ref 4.0–10.5)
nRBC: 0.2 % (ref 0.0–0.2)

## 2019-03-03 NOTE — Progress Notes (Signed)
[facility]  ROUNDING NOTE   Subjective:  Patient eating breakfast this a.m. Due for hemodialysis today. Appears to be in better spirits.    Objective:  Vital signs in last 24 hours:  Temperature 90.9 pulse 70 respirations 20 blood pressure 107/81  Physical Exam: General: No acute distress  Head: Normocephalic, atraumatic. Moist oral mucosal membranes  Eyes: Anicteric  Neck: Supple, trachea midline  Lungs:  Clear to auscultation, normal effort  Heart: S1S2 no rubs  Abdomen:  Soft, nontender, bowel sounds present  Extremities: Trace peripheral edema.  Neurologic: Awake, alert, following commands  Skin: No lesions  Access: Right IJ PermCath    Basic Metabolic Panel: Recent Labs  Lab 02/24/19 1008 02/24/19 1008 02/26/19 0634 02/26/19 0634 02/27/19 0609 03/01/19 1028 03/03/19 0556  NA 144  --  137  --  139 137 135  K 4.4  --  4.7  --  4.3 4.6 5.4*  CL 104  --  99  --  98 97* 98  CO2 20*  --  22  --  22 25 22   GLUCOSE 115*  --  180*  --  220* 264* 176*  BUN 59*  --  40*  --  63* 66* 68*  CREATININE 8.24*  --  5.99*  --  7.20* 4.71* 4.38*  CALCIUM 10.0  9.7   < > 9.7   < > 10.4* 9.4 9.8  PHOS 6.0*  --   --   --  4.7* 7.0* 6.2*   < > = values in this interval not displayed.    Liver Function Tests: Recent Labs  Lab 02/24/19 1008 02/27/19 0609 03/01/19 1028 03/03/19 0556  ALBUMIN 3.0* 2.7* 2.8* 2.9*   No results for input(s): LIPASE, AMYLASE in the last 168 hours. No results for input(s): AMMONIA in the last 168 hours.  CBC: Recent Labs  Lab 02/24/19 1008 02/26/19 0634 02/27/19 0609 03/01/19 1028 03/03/19 0556  WBC 13.6* 8.3 7.6 7.0 9.6  HGB 11.9* 10.5* 9.3* 9.6* 9.6*  HCT 38.8 34.2* 30.3* 31.9* 30.8*  MCV 97.7 97.2 97.4 99.7 98.1  PLT 142* 131* 140* 139* 114*    Cardiac Enzymes: No results for input(s): CKTOTAL, CKMB, CKMBINDEX, TROPONINI in the last 168 hours.  BNP: Invalid input(s): POCBNP  CBG: No results for input(s):  GLUCAP in the last 168 hours.  Microbiology: Results for orders placed or performed during the hospital encounter of 02/09/19  MRSA PCR Screening     Status: None   Collection Time: 02/11/19  1:21 AM   Specimen: Nasal Mucosa; Nasopharyngeal  Result Value Ref Range Status   MRSA by PCR NEGATIVE NEGATIVE Final    Comment:        The GeneXpert MRSA Assay (FDA approved for NASAL specimens only), is one component of a comprehensive MRSA colonization surveillance program. It is not intended to diagnose MRSA infection nor to guide or monitor treatment for MRSA infections. Performed at [facility], [place] [address] [place], [place] [address]   Culture, blood (routine x 2)     Status: None   Collection Time: 02/18/19 10:14 PM   Specimen: BLOOD RIGHT HAND  Result Value Ref Range Status   Specimen Description BLOOD RIGHT HAND  Final   Special Requests   Final    BOTTLES DRAWN AEROBIC AND ANAEROBIC Blood Culture adequate volume   Culture   Final    NO GROWTH 5 DAYS Performed at [facility], [place] [address], [place], [place]  [id number]    Report Status 02/23/2019 FINAL  Final  Culture, blood (routine x 2)     Status: None   Collection Time: 02/18/19 10:20 PM   Specimen: BLOOD LEFT HAND  Result Value Ref Range Status   Specimen Description BLOOD LEFT HAND  Final   Special Requests   Final    BOTTLES DRAWN AEROBIC ONLY Blood Culture results may not be optimal due to an inadequate volume of blood received in culture bottles   Culture   Final    NO GROWTH 5 DAYS Performed at [facility], [place] [address], [place], [place] [address]    Report Status 02/23/2019 FINAL  Final    Coagulation Studies: No results for input(s): LABPROT, INR in the last 72 hours.  Urinalysis: No results for input(s): COLORURINE, LABSPEC, PHURINE, GLUCOSEU, HGBUR, BILIRUBINUR, KETONESUR, PROTEINUR, UROBILINOGEN, NITRITE, LEUKOCYTESUR in the last 72 hours.  Invalid input(s): APPERANCEUR     Imaging: No results found.   Medications:       Assessment/ Plan:  74 y.o. female with a PMHx of COPD, diabetes mellitus type 2, ESRD on HD, history of TIA, recent COVID-19 infection who was admitted to [facility] on 02/23/2019 for ongoing care.   1.  ESRD on HD.  Patient due for hemodialysis today.  Orders have been prepared.  2.  Hypotension.  Maintain current dosage of midodrine.  3.  Anemia of chronic kidney disease.  Hemoglobin up to 9.6.  Maintain the patient on Retacrit.  4.  Secondary hyperparathyroidism.  Calcium down to 9.8.  Phosphorus a bit higher at 6.2.  Appears to be eating better.  Continue to monitor phosphorus closely.   LOS: 0 Maverick Patman 2/5/20218:20 AM

## 2019-03-04 LAB — BASIC METABOLIC PANEL
Anion gap: 16 — ABNORMAL HIGH (ref 5–15)
BUN: 38 mg/dL — ABNORMAL HIGH (ref 8–23)
CO2: 22 mmol/L (ref 22–32)
Calcium: 9.1 mg/dL (ref 8.9–10.3)
Chloride: 97 mmol/L — ABNORMAL LOW (ref 98–111)
Creatinine, Ser: 2.93 mg/dL — ABNORMAL HIGH (ref 0.44–1.00)
GFR calc Af Amer: 17 mL/min — ABNORMAL LOW (ref >60)
GFR calc non Af Amer: 15 mL/min — ABNORMAL LOW (ref >60)
Glucose, Bld: 128 mg/dL — ABNORMAL HIGH (ref 70–99)
Potassium: 4 mmol/L (ref 3.5–5.1)
Sodium: 135 mmol/L (ref 135–145)

## 2019-03-06 LAB — CBC
HCT: 29.5 % — ABNORMAL LOW (ref 36.0–46.0)
Hemoglobin: 9.2 g/dL — ABNORMAL LOW (ref 12.0–15.0)
MCH: 30.6 pg (ref 26.0–34.0)
MCHC: 31.2 g/dL (ref 30.0–36.0)
MCV: 98 fL (ref 80.0–100.0)
Platelets: 92 10*3/uL — ABNORMAL LOW (ref 150–400)
RBC: 3.01 MIL/uL — ABNORMAL LOW (ref 3.87–5.11)
RDW: 20.7 % — ABNORMAL HIGH (ref 11.5–15.5)
WBC: 9.1 10*3/uL (ref 4.0–10.5)
nRBC: 0 % (ref 0.0–0.2)

## 2019-03-06 LAB — TSH: TSH: 2.632 u[IU]/mL (ref 0.350–4.500)

## 2019-03-06 NOTE — Progress Notes (Signed)
[facility]  ROUNDING NOTE   Subjective:  Patient seen at bedside. Eating breakfast. Due for dialysis treatment today.    Objective:  Vital signs in last 24 hours:  Temperature 96.5 pulse 80 respirations 24 blood pressure 135/57  Physical Exam: General: No acute distress  Head: Normocephalic, atraumatic. Moist oral mucosal membranes  Eyes: Anicteric  Neck: Supple, trachea midline  Lungs:  Clear to auscultation, normal effort  Heart: S1S2 no rubs  Abdomen:  Soft, nontender, bowel sounds present  Extremities: Trace peripheral edema.  Neurologic: Awake, alert, following commands  Skin: No lesions  Access: Right IJ PermCath    Basic Metabolic Panel: Recent Labs  Lab 03/01/19 1028 03/01/19 1028 03/03/19 0556 03/04/19 0844 03/06/19 0447  NA 137  --  135 135 134*  K 4.6  --  5.4* 4.0 5.4*  CL 97*  --  98 97* 91*  CO2 25  --  22 22 24   GLUCOSE 264*  --  176* 128* 242*  BUN 66*  --  68* 38* 92*  CREATININE 4.71*  --  4.38* 2.93* 5.11*  CALCIUM 9.4   < > 9.8 9.1 9.5  PHOS 7.0*  --  6.2*  --  6.3*   < > = values in this interval not displayed.    Liver Function Tests: Recent Labs  Lab 03/01/19 1028 03/03/19 0556 03/06/19 0447  ALBUMIN 2.8* 2.9* 2.8*   No results for input(s): LIPASE, AMYLASE in the last 168 hours. No results for input(s): AMMONIA in the last 168 hours.  CBC: Recent Labs  Lab 03/01/19 1028 03/03/19 0556 03/06/19 0447  WBC 7.0 9.6 9.1  HGB 9.6* 9.6* 9.2*  HCT 31.9* 30.8* 29.5*  MCV 99.7 98.1 98.0  PLT 139* 114* 92*    Cardiac Enzymes: No results for input(s): CKTOTAL, CKMB, CKMBINDEX, TROPONINI in the last 168 hours.  BNP: Invalid input(s): POCBNP  CBG: No results for input(s): GLUCAP in the last 168 hours.  Microbiology: Results for orders placed or performed during the hospital encounter of 02/09/19  MRSA PCR Screening     Status: None   Collection Time: 02/11/19  1:21 AM   Specimen: Nasal Mucosa; Nasopharyngeal   Result Value Ref Range Status   MRSA by PCR NEGATIVE NEGATIVE Final    Comment:        The GeneXpert MRSA Assay (FDA approved for NASAL specimens only), is one component of a comprehensive MRSA colonization surveillance program. It is not intended to diagnose MRSA infection nor to guide or monitor treatment for MRSA infections. Performed at [facility], [place] [address], [place], [place] [address]   Culture, blood (routine x 2)     Status: None   Collection Time: 02/18/19 10:14 PM   Specimen: BLOOD RIGHT HAND  Result Value Ref Range Status   Specimen Description BLOOD RIGHT HAND  Final   Special Requests   Final    BOTTLES DRAWN AEROBIC AND ANAEROBIC Blood Culture adequate volume   Culture   Final    NO GROWTH 5 DAYS Performed at [facility], [place] [address] [place], [place] [address]    Report Status 02/23/2019 FINAL  Final  Culture, blood (routine x 2)     Status: None   Collection Time: 02/18/19 10:20 PM   Specimen: BLOOD LEFT HAND  Result Value Ref Range Status   Specimen Description BLOOD LEFT HAND  Final   Special Requests   Final    BOTTLES DRAWN AEROBIC ONLY  Blood Culture results may not be optimal due to an inadequate volume of blood received in culture bottles   Culture   Final    NO GROWTH 5 DAYS Performed at [facility] [address], [place], [place] [address]    Report Status 02/23/2019 FINAL  Final    Coagulation Studies: No results for input(s): LABPROT, INR in the last 72 hours.  Urinalysis: No results for input(s): COLORURINE, LABSPEC, PHURINE, GLUCOSEU, HGBUR, BILIRUBINUR, KETONESUR, PROTEINUR, UROBILINOGEN, NITRITE, LEUKOCYTESUR in the last 72 hours.  Invalid input(s): APPERANCEUR    Imaging: No results found.   Medications:       Assessment/ Plan:  74 y.o. female with a PMHx of COPD, diabetes mellitus type 2, ESRD on HD, history of TIA, recent COVID-19 infection who was admitted to [facility] on 02/23/2019 for ongoing  care.   1.  ESRD on HD.  Patient due for dialysis treatment today.  Orders have been prepared.  Continue dialysis on MWF schedule.  2.  Hypotension.  Continue midodrine as prescribed.  3.  Anemia of chronic kidney disease.  Hemoglobin 9.2.  Continue Retacrit.  4.  Secondary hyperparathyroidism.  Phosphorus higher at 6.3.  Should come down with ongoing dialysis treatment.   LOS: 0 Joan Mosley 2/8/20218:44 AM

## 2019-03-07 LAB — RENAL FUNCTION PANEL
Albumin: 2.8 g/dL — ABNORMAL LOW (ref 3.5–5.0)
Anion gap: 19 — ABNORMAL HIGH (ref 5–15)
BUN: 92 mg/dL — ABNORMAL HIGH (ref 8–23)
CO2: 24 mmol/L (ref 22–32)
Calcium: 9.5 mg/dL (ref 8.9–10.3)
Chloride: 91 mmol/L — ABNORMAL LOW (ref 98–111)
Creatinine, Ser: 5.11 mg/dL — ABNORMAL HIGH (ref 0.44–1.00)
GFR calc Af Amer: 9 mL/min — ABNORMAL LOW (ref >60)
GFR calc non Af Amer: 8 mL/min — ABNORMAL LOW (ref >60)
Glucose, Bld: 242 mg/dL — ABNORMAL HIGH (ref 70–99)
Phosphorus: 6.3 mg/dL — ABNORMAL HIGH (ref 2.5–4.6)
Potassium: 5.4 mmol/L — ABNORMAL HIGH (ref 3.5–5.1)
Sodium: 134 mmol/L — ABNORMAL LOW (ref 135–145)

## 2019-03-08 LAB — CBC
HCT: 31.6 % — ABNORMAL LOW (ref 36.0–46.0)
Hemoglobin: 10.1 g/dL — ABNORMAL LOW (ref 12.0–15.0)
MCH: 31.4 pg (ref 26.0–34.0)
MCHC: 32 g/dL (ref 30.0–36.0)
MCV: 98.1 fL (ref 80.0–100.0)
Platelets: 92 10*3/uL — ABNORMAL LOW (ref 150–400)
RBC: 3.22 MIL/uL — ABNORMAL LOW (ref 3.87–5.11)
RDW: 20.6 % — ABNORMAL HIGH (ref 11.5–15.5)
WBC: 12.5 10*3/uL — ABNORMAL HIGH (ref 4.0–10.5)
nRBC: 0.2 % (ref 0.0–0.2)

## 2019-03-08 LAB — RENAL FUNCTION PANEL
Albumin: 3 g/dL — ABNORMAL LOW (ref 3.5–5.0)
Anion gap: 14 (ref 5–15)
BUN: 54 mg/dL — ABNORMAL HIGH (ref 8–23)
CO2: 27 mmol/L (ref 22–32)
Calcium: 9.3 mg/dL (ref 8.9–10.3)
Chloride: 91 mmol/L — ABNORMAL LOW (ref 98–111)
Creatinine, Ser: 3.92 mg/dL — ABNORMAL HIGH (ref 0.44–1.00)
GFR calc Af Amer: 12 mL/min — ABNORMAL LOW (ref >60)
GFR calc non Af Amer: 11 mL/min — ABNORMAL LOW (ref >60)
Glucose, Bld: 145 mg/dL — ABNORMAL HIGH (ref 70–99)
Phosphorus: 6.2 mg/dL — ABNORMAL HIGH (ref 2.5–4.6)
Potassium: 4.1 mmol/L (ref 3.5–5.1)
Sodium: 132 mmol/L — ABNORMAL LOW (ref 135–145)

## 2019-03-08 NOTE — Progress Notes (Signed)
[facility]  ROUNDING NOTE   Subjective:  Patient seen and evaluated. Due for dialysis treatment today. Resting comfortably in bed at the moment.    Objective:  Vital signs in last 24 hours:  Temperature 97.8 pulse 76 respiration 19 blood pressure 130/84  Physical Exam: General: No acute distress  Head: Normocephalic, atraumatic. Moist oral mucosal membranes  Eyes: Anicteric  Neck: Supple, trachea midline  Lungs:  Clear to auscultation, normal effort  Heart: S1S2 no rubs  Abdomen:  Soft, nontender, bowel sounds present  Extremities: Trace peripheral edema.  Neurologic: Awake, alert, following commands  Skin: No lesions  Access: Right IJ PermCath    Basic Metabolic Panel: Recent Labs  Lab 03/03/19 0556 03/03/19 0556 03/04/19 0844 03/06/19 0447 03/08/19 0915  NA 135  --  135 134* 132*  K 5.4*  --  4.0 5.4* 4.1  CL 98  --  97* 91* 91*  CO2 22  --  22 24 27   GLUCOSE 176*  --  128* 242* 145*  BUN 68*  --  38* 92* 54*  CREATININE 4.38*  --  2.93* 5.11* 3.92*  CALCIUM 9.8   < > 9.1 9.5 9.3  PHOS 6.2*  --   --  6.3* 6.2*   < > = values in this interval not displayed.    Liver Function Tests: Recent Labs  Lab 03/03/19 0556 03/06/19 0447 03/08/19 0915  ALBUMIN 2.9* 2.8* 3.0*   No results for input(s): LIPASE, AMYLASE in the last 168 hours. No results for input(s): AMMONIA in the last 168 hours.  CBC: Recent Labs  Lab 03/03/19 0556 03/06/19 0447 03/08/19 0915  WBC 9.6 9.1 12.5*  HGB 9.6* 9.2* 10.1*  HCT 30.8* 29.5* 31.6*  MCV 98.1 98.0 98.1  PLT 114* 92* 92*    Cardiac Enzymes: No results for input(s): CKTOTAL, CKMB, CKMBINDEX, TROPONINI in the last 168 hours.  BNP: Invalid input(s): POCBNP  CBG: No results for input(s): GLUCAP in the last 168 hours.  Microbiology: Results for orders placed or performed during the hospital encounter of 02/09/19  MRSA PCR Screening     Status: None   Collection Time: 02/11/19  1:21 AM   Specimen:  Nasal Mucosa; Nasopharyngeal  Result Value Ref Range Status   MRSA by PCR NEGATIVE NEGATIVE Final    Comment:        The GeneXpert MRSA Assay (FDA approved for NASAL specimens only), is one component of a comprehensive MRSA colonization surveillance program. It is not intended to diagnose MRSA infection nor to guide or monitor treatment for MRSA infections. Performed at [facility], [place] [address], [place], [place] [address]   Culture, blood (routine x 2)     Status: None   Collection Time: 02/18/19 10:14 PM   Specimen: BLOOD RIGHT HAND  Result Value Ref Range Status   Specimen Description BLOOD RIGHT HAND  Final   Special Requests   Final    BOTTLES DRAWN AEROBIC AND ANAEROBIC Blood Culture adequate volume   Culture   Final    NO GROWTH 5 DAYS Performed at [facility], [place] [address] [place], [place] [address]    Report Status 02/23/2019 FINAL  Final  Culture, blood (routine x 2)     Status: None   Collection Time: 02/18/19 10:20 PM   Specimen: BLOOD LEFT HAND  Result Value Ref Range Status   Specimen Description BLOOD LEFT HAND  Final   Special Requests   Final  BOTTLES DRAWN AEROBIC ONLY Blood Culture results may not be optimal due to an inadequate volume of blood received in culture bottles   Culture   Final    NO GROWTH 5 DAYS Performed at [facility], [place] [address], [place], [place] [address]    Report Status 02/23/2019 FINAL  Final    Coagulation Studies: No results for input(s): LABPROT, INR in the last 72 hours.  Urinalysis: No results for input(s): COLORURINE, LABSPEC, PHURINE, GLUCOSEU, HGBUR, BILIRUBINUR, KETONESUR, PROTEINUR, UROBILINOGEN, NITRITE, LEUKOCYTESUR in the last 72 hours.  Invalid input(s): APPERANCEUR    Imaging: No results found.   Medications:       Assessment/ Plan:  74 y.o. female with a PMHx of COPD, diabetes mellitus type 2, ESRD on HD, history of TIA, recent COVID-19 infection who was admitted to  [facility] on 02/23/2019 for ongoing care.   1.  ESRD on HD.  We will maintain the patient on MWF dialysis schedule.  She is due for dialysis treatment today.  2.  Hypotension.  Patient being maintained on midodrine.  3.  Anemia of chronic kidney disease.  Hemoglobin up to 10.1.  Maintain the patient on Retacrit.  4.  Secondary hyperparathyroidism.  Phosphorus still a bit high at 6.2.  Continue to monitor trend for now.   LOS: 0 Serai Tukes 2/10/202111:42 AM

## 2019-03-10 LAB — NOVEL CORONAVIRUS, NAA (HOSP ORDER, SEND-OUT TO REF LAB; TAT 18-24 HRS): SARS-CoV-2, NAA: DETECTED — AB

## 2019-03-10 LAB — RENAL FUNCTION PANEL
Albumin: 2.9 g/dL — ABNORMAL LOW (ref 3.5–5.0)
Anion gap: 18 — ABNORMAL HIGH (ref 5–15)
BUN: 54 mg/dL — ABNORMAL HIGH (ref 8–23)
CO2: 23 mmol/L (ref 22–32)
Calcium: 10 mg/dL (ref 8.9–10.3)
Chloride: 98 mmol/L (ref 98–111)
Creatinine, Ser: 4.36 mg/dL — ABNORMAL HIGH (ref 0.44–1.00)
GFR calc Af Amer: 11 mL/min — ABNORMAL LOW (ref >60)
GFR calc non Af Amer: 9 mL/min — ABNORMAL LOW (ref >60)
Glucose, Bld: 124 mg/dL — ABNORMAL HIGH (ref 70–99)
Phosphorus: 5 mg/dL — ABNORMAL HIGH (ref 2.5–4.6)
Potassium: 4.5 mmol/L (ref 3.5–5.1)
Sodium: 139 mmol/L (ref 135–145)

## 2019-03-10 LAB — CBC
HCT: 32.2 % — ABNORMAL LOW (ref 36.0–46.0)
Hemoglobin: 10.1 g/dL — ABNORMAL LOW (ref 12.0–15.0)
MCH: 31.3 pg (ref 26.0–34.0)
MCHC: 31.4 g/dL (ref 30.0–36.0)
MCV: 99.7 fL (ref 80.0–100.0)
Platelets: 77 10*3/uL — ABNORMAL LOW (ref 150–400)
RBC: 3.23 MIL/uL — ABNORMAL LOW (ref 3.87–5.11)
RDW: 20.8 % — ABNORMAL HIGH (ref 11.5–15.5)
WBC: 11.2 10*3/uL — ABNORMAL HIGH (ref 4.0–10.5)
nRBC: 0 % (ref 0.0–0.2)

## 2019-03-10 NOTE — Progress Notes (Signed)
[facility]  ROUNDING NOTE   Subjective:  Patient seen and evaluated during dialysis treatment. Tolerating well.    Objective:  Vital signs in last 24 hours:  Temperature 97.2 pulse 83 respirations 20 blood pressure 161/77  Physical Exam: General: No acute distress  Head: Normocephalic, atraumatic. Moist oral mucosal membranes  Eyes: Anicteric  Neck: Supple, trachea midline  Lungs:  Clear to auscultation, normal effort  Heart: S1S2 no rubs  Abdomen:  Soft, nontender, bowel sounds present  Extremities: Trace peripheral edema.  Neurologic: Awake, alert, following commands  Skin: No lesions  Access: Right IJ PermCath    Basic Metabolic Panel: Recent Labs  Lab 03/04/19 0844 03/04/19 0844 03/06/19 0447 03/08/19 0915 03/10/19 0527  NA 135  --  134* 132* 139  K 4.0  --  5.4* 4.1 4.5  CL 97*  --  91* 91* 98  CO2 22  --  24 27 23   GLUCOSE 128*  --  242* 145* 124*  BUN 38*  --  92* 54* 54*  CREATININE 2.93*  --  5.11* 3.92* 4.36*  CALCIUM 9.1   < > 9.5 9.3 10.0  PHOS  --   --  6.3* 6.2* 5.0*   < > = values in this interval not displayed.    Liver Function Tests: Recent Labs  Lab 03/06/19 0447 03/08/19 0915 03/10/19 0527  ALBUMIN 2.8* 3.0* 2.9*   No results for input(s): LIPASE, AMYLASE in the last 168 hours. No results for input(s): AMMONIA in the last 168 hours.  CBC: Recent Labs  Lab 03/06/19 0447 03/08/19 0915 03/10/19 0527  WBC 9.1 12.5* 11.2*  HGB 9.2* 10.1* 10.1*  HCT 29.5* 31.6* 32.2*  MCV 98.0 98.1 99.7  PLT 92* 92* 77*    Cardiac Enzymes: No results for input(s): CKTOTAL, CKMB, CKMBINDEX, TROPONINI in the last 168 hours.  BNP: Invalid input(s): POCBNP  CBG: No results for input(s): GLUCAP in the last 168 hours.  Microbiology: Results for orders placed or performed during the hospital encounter of 02/23/19  Novel Coronavirus, NAA (hospital order; send-out to ref lab)     Status: Abnormal   Collection Time: 03/08/19  4:30  PM   Specimen: Nasopharyngeal Swab; Respiratory  Result Value Ref Range Status   SARS-CoV-2, NAA DETECTED (A) NOT DETECTED Final    Comment: (NOTE)                  Client Requested Flag This nucleic acid amplification test was developed and its performance characteristics determined by [employer]. Nucleic acid amplification tests include RT-PCR and TMA. This test has not been FDA cleared or approved. This test has been authorized by FDA under an Emergency Use Authorization (EUA). This test is only authorized for the duration of time the declaration that circumstances exist justifying the authorization of the emergency use of in vitro diagnostic tests for detection of SARS-CoV-2 virus and/or diagnosis of COVID-19 infection under section 564(b)(1) of the Act, 21 U.S.C. [id number]) (1), unless the authorization is terminated or revoked sooner. When diagnostic testing is negative, the possibility of a false negative result should be considered in the context of a patient's recent exposures and the presence of clinical signs and symptoms consistent with COVID-19. An individual without symptoms of COVID - 19 and who is not shedding SARS-CoV-2 virus would expect to have a negative (not detected) result in this assay. Performed At: [facility] [address] [place], [place] [id number] Rush Farmer MD [id number]  Source NASOPHARYNGEAL  Final    Comment: Performed at [facility], [place] [address], [place], [place] [address]    Coagulation Studies: No results for input(s): LABPROT, INR in the last 72 hours.  Urinalysis: No results for input(s): COLORURINE, LABSPEC, PHURINE, GLUCOSEU, HGBUR, BILIRUBINUR, KETONESUR, PROTEINUR, UROBILINOGEN, NITRITE, LEUKOCYTESUR in the last 72 hours.  Invalid input(s): APPERANCEUR    Imaging: No results found.   Medications:       Assessment/ Plan:  74 y.o. female with a PMHx of COPD, diabetes  mellitus type 2, ESRD on HD, history of TIA, recent COVID-19 infection who was admitted to [facility] on 02/23/2019 for ongoing care.   1.  ESRD on HD.  Patient seen during dialysis treatment.  Tolerating well.  Complete dialysis treatment today and schedule next dialysis treatment for Monday.  2.  Hypotension.  Continue midodrine.  3.  Anemia of chronic kidney disease.  Hemoglobin remains at target at 10.1.  Therefore we will continue Retacrit.  4.  Secondary hyperparathyroidism.  Phosphorus now down to 5.0.  Continue to monitor.   LOS: 0 Kiaan Overholser 2/12/20218:30 AM

## 2019-03-11 ENCOUNTER — Other Ambulatory Visit (HOSPITAL_COMMUNITY): Payer: Medicare Other

## 2019-03-12 LAB — HEPARIN INDUCED PLATELET AB (HIT ANTIBODY): Heparin Induced Plt Ab: 0.087 OD (ref 0.000–0.400)

## 2019-03-13 LAB — NOVEL CORONAVIRUS, NAA (HOSP ORDER, SEND-OUT TO REF LAB; TAT 18-24 HRS): SARS-CoV-2, NAA: DETECTED — AB

## 2019-03-13 LAB — CBC
HCT: 28.3 % — ABNORMAL LOW (ref 36.0–46.0)
Hemoglobin: 8.4 g/dL — ABNORMAL LOW (ref 12.0–15.0)
MCH: 30.5 pg (ref 26.0–34.0)
MCHC: 29.7 g/dL — ABNORMAL LOW (ref 30.0–36.0)
MCV: 102.9 fL — ABNORMAL HIGH (ref 80.0–100.0)
Platelets: 121 10*3/uL — ABNORMAL LOW (ref 150–400)
RBC: 2.75 MIL/uL — ABNORMAL LOW (ref 3.87–5.11)
RDW: 20.8 % — ABNORMAL HIGH (ref 11.5–15.5)
WBC: 8 10*3/uL (ref 4.0–10.5)
nRBC: 0.3 % — ABNORMAL HIGH (ref 0.0–0.2)

## 2019-03-13 LAB — RENAL FUNCTION PANEL
Albumin: 2.8 g/dL — ABNORMAL LOW (ref 3.5–5.0)
Anion gap: 18 — ABNORMAL HIGH (ref 5–15)
BUN: 95 mg/dL — ABNORMAL HIGH (ref 8–23)
CO2: 25 mmol/L (ref 22–32)
Calcium: 9.7 mg/dL (ref 8.9–10.3)
Chloride: 98 mmol/L (ref 98–111)
Creatinine, Ser: 5.7 mg/dL — ABNORMAL HIGH (ref 0.44–1.00)
GFR calc Af Amer: 8 mL/min — ABNORMAL LOW (ref >60)
GFR calc non Af Amer: 7 mL/min — ABNORMAL LOW (ref >60)
Glucose, Bld: 93 mg/dL (ref 70–99)
Phosphorus: 4.5 mg/dL (ref 2.5–4.6)
Potassium: 4.7 mmol/L (ref 3.5–5.1)
Sodium: 141 mmol/L (ref 135–145)

## 2019-03-13 NOTE — Progress Notes (Signed)
[facility]  ROUNDING NOTE   Subjective:  Patient seen at bedside. Due for hemodialysis today. Eating breakfast at the moment.    Objective:  Vital signs in last 24 hours:  Temperature 96.8 pulse 69 respirations 23 blood pressure 111/64  Physical Exam: General: No acute distress  Head: Normocephalic, atraumatic. Moist oral mucosal membranes  Eyes: Anicteric  Neck: Supple, trachea midline  Lungs:  Clear to auscultation, normal effort  Heart: S1S2 no rubs  Abdomen:  Soft, nontender, bowel sounds present  Extremities: Trace peripheral edema.  Neurologic: Awake, alert, following commands  Skin: No lesions  Access: Right IJ PermCath    Basic Metabolic Panel: Recent Labs  Lab 03/08/19 0915 03/10/19 0527 03/13/19 0734  NA 132* 139 141  K 4.1 4.5 4.7  CL 91* 98 98  CO2 27 23 25   GLUCOSE 145* 124* 93  BUN 54* 54* 95*  CREATININE 3.92* 4.36* 5.70*  CALCIUM 9.3 10.0 9.7  PHOS 6.2* 5.0* 4.5    Liver Function Tests: Recent Labs  Lab 03/08/19 0915 03/10/19 0527 03/13/19 0734  ALBUMIN 3.0* 2.9* 2.8*   No results for input(s): LIPASE, AMYLASE in the last 168 hours. No results for input(s): AMMONIA in the last 168 hours.  CBC: Recent Labs  Lab 03/08/19 0915 03/10/19 0527 03/13/19 0734  WBC 12.5* 11.2* 8.0  HGB 10.1* 10.1* 8.4*  HCT 31.6* 32.2* 28.3*  MCV 98.1 99.7 102.9*  PLT 92* 77* 121*    Cardiac Enzymes: No results for input(s): CKTOTAL, CKMB, CKMBINDEX, TROPONINI in the last 168 hours.  BNP: Invalid input(s): POCBNP  CBG: No results for input(s): GLUCAP in the last 168 hours.  Microbiology: Results for orders placed or performed during the hospital encounter of 02/23/19  Novel Coronavirus, NAA (hospital order; send-out to ref lab)     Status: Abnormal   Collection Time: 03/08/19  4:30 PM   Specimen: Nasopharyngeal Swab; Respiratory  Result Value Ref Range Status   SARS-CoV-2, NAA DETECTED (A) NOT DETECTED Final    Comment: RESULT  CALLED TO, READ BACK BY AND VERIFIED WITH: Rosalyn Charters RN 03/10/19 1806 JDW (NOTE)                  Client Requested Flag This nucleic acid amplification test was developed and its performance characteristics determined by [employer]. Nucleic acid amplification tests include RT-PCR and TMA. This test has not been FDA cleared or approved. This test has been authorized by FDA under an Emergency Use Authorization (EUA). This test is only authorized for the duration of time the declaration that circumstances exist justifying the authorization of the emergency use of in vitro diagnostic tests for detection of SARS-CoV-2 virus and/or diagnosis of COVID-19 infection under section 564(b)(1) of the Act, 21 U.S.C. [id number]) (1), unless the authorization is terminated or revoked sooner. When diagnostic testing is negative, the possibility of a false negative result should be considered in the context of a patient's recent exposures and the presence of clinical si gns and symptoms consistent with COVID-19. An individual without symptoms of COVID- 19 and who is not shedding SARS-CoV-2 virus would expect to have a negative (not detected) result in this assay. Performed At: [facility] [address] [place], [place] [id number] Rush Farmer MD [id number]    [place]  Final    Comment: Performed at [facility], [place] [address], [place], [place] [address]  Novel Coronavirus, NAA (hospital order; send-out to ref lab)  Status: Abnormal   Collection Time: 03/10/19  3:55 PM   Specimen: Nasopharyngeal Swab; Respiratory  Result Value Ref Range Status   SARS-CoV-2, NAA DETECTED (A) NOT DETECTED Final    Comment: RESULT CALLED TO, READ BACK BY AND VERIFIED WITH: C. NELSON RN, AT [id number] 03/13/19 BY D. VANHOOK (NOTE)                  Client Requested Flag This nucleic acid amplification test was developed and its performance characteristics determined  by [employer]. Nucleic acid amplification tests include RT-PCR and TMA. This test has not been FDA cleared or approved. This test has been authorized by FDA under an Emergency Use Authorization (EUA). This test is only authorized for the duration of time the declaration that circumstances exist justifying the authorization of the emergency use of in vitro diagnostic tests for detection of SARS-CoV-2 virus and/or diagnosis of COVID-19 infection under section 564(b)(1) of the Act, 21 U.S.C. [id number]) (1), unless the authorization is terminated or revoked sooner. When diagnostic testing is negative, the possibility of a false negative result should be considered in the context of a patient's recent exposures and the presence o f clinical signs and symptoms consistent with COVID-19. An individual without symptoms of COVID- 19 and who is not shedding SARS-CoV-2 virus would expect to have a negative (not detected) result in this assay. Performed At: [facility] [address] [place], [place] [id number] Rush Farmer MD [id number]    [place]  Final    Comment: Performed at [facility], [place] [address], [place], [place] [address]    Coagulation Studies: No results for input(s): LABPROT, INR in the last 72 hours.  Urinalysis: No results for input(s): COLORURINE, LABSPEC, PHURINE, GLUCOSEU, HGBUR, BILIRUBINUR, KETONESUR, PROTEINUR, UROBILINOGEN, NITRITE, LEUKOCYTESUR in the last 72 hours.  Invalid input(s): APPERANCEUR    Imaging: DG CHEST PORT 1 VIEW  Result Date: 03/11/2019 CLINICAL DATA:  Hypoxemia, history of COVID-19 positivity EXAM: PORTABLE CHEST 1 VIEW COMPARISON:  02/23/2019 FINDINGS: Dialysis catheter is noted with catheter tip at the cavoatrial junction. Cardiac shadow is stable. Mild left basilar atelectasis is noted. Right-sided pleural effusion is seen increased from the prior study. Likely underlying atelectasis is  present as well. Mild increased density is noted throughout both lungs consistent with mild edema. No other focal abnormality is seen. IMPRESSION: Mild edema within both lungs. Increasing right-sided effusion and underlying atelectasis. Electronically Signed   By: Inez Catalina M.D.   On: 03/11/2019 13:14     Medications:       Assessment/ Plan:  74 y.o. female with a PMHx of COPD, diabetes mellitus type 2, ESRD on HD, history of TIA, recent COVID-19 infection who was [facility] on [facility]/2019 for ongoing care.   1.  ESRD on HD.  Patient due for hemodialysis treatment today.  Orders have been prepared.  Continue dialysis on MWF schedule.  2.  Hypotension.  Maintain the patient on midodrine for blood pressure support.  3.  Anemia of chronic kidney disease.  Hemoglobin is dropped a bit to 8.4 today.  Maintain the patient on Retacrit.  Continue to monitor CBC.  4.  Secondary hyperparathyroidism.  Phosphorus 4.5 and acceptable.   LOS: 0 Keidy Thurgood 2/15/20219:38 AM

## 2019-03-14 ENCOUNTER — Other Ambulatory Visit (HOSPITAL_COMMUNITY): Payer: Medicare Other

## 2019-03-14 NOTE — Progress Notes (Signed)
Request to IR for bedside diagnostic and therapeutic thoracentesis based on CXR dated 03/11/19.  Limited chest [name] of right and left sides showed no pleural fluid. No procedure performed today. Images are available for review under imaging section of Epic. Patient RN aware, note in patient's chart.  IR remains available as needed - please place new order if it is felt this patient would benefit from re-examination.   Candiss Norse, PA-C

## 2019-03-15 LAB — CBC
HCT: 30.5 % — ABNORMAL LOW (ref 36.0–46.0)
Hemoglobin: 9.3 g/dL — ABNORMAL LOW (ref 12.0–15.0)
MCH: 31 pg (ref 26.0–34.0)
MCHC: 30.5 g/dL (ref 30.0–36.0)
MCV: 101.7 fL — ABNORMAL HIGH (ref 80.0–100.0)
Platelets: 108 10*3/uL — ABNORMAL LOW (ref 150–400)
RBC: 3 MIL/uL — ABNORMAL LOW (ref 3.87–5.11)
RDW: 20.4 % — ABNORMAL HIGH (ref 11.5–15.5)
WBC: 7.7 10*3/uL (ref 4.0–10.5)
nRBC: 0 % (ref 0.0–0.2)

## 2019-03-15 LAB — BLOOD GAS, ARTERIAL
Acid-base deficit: 26.4 mmol/L — ABNORMAL HIGH (ref 0.0–2.0)
Bicarbonate: 26.1 mmol/L (ref 20.0–28.0)
FIO2: 40
O2 Saturation: 99 %
Patient temperature: 36.1
pCO2 arterial: 40 mmHg (ref 32.0–48.0)
pH, Arterial: 7.431 (ref 7.350–7.450)
pO2, Arterial: 163 mmHg — ABNORMAL HIGH (ref 83.0–108.0)

## 2019-03-15 LAB — RENAL FUNCTION PANEL
Albumin: 3 g/dL — ABNORMAL LOW (ref 3.5–5.0)
Anion gap: 15 (ref 5–15)
BUN: 60 mg/dL — ABNORMAL HIGH (ref 8–23)
CO2: 26 mmol/L (ref 22–32)
Calcium: 9.8 mg/dL (ref 8.9–10.3)
Chloride: 98 mmol/L (ref 98–111)
Creatinine, Ser: 4.25 mg/dL — ABNORMAL HIGH (ref 0.44–1.00)
GFR calc Af Amer: 11 mL/min — ABNORMAL LOW (ref >60)
GFR calc non Af Amer: 10 mL/min — ABNORMAL LOW (ref >60)
Glucose, Bld: 126 mg/dL — ABNORMAL HIGH (ref 70–99)
Phosphorus: 4.4 mg/dL (ref 2.5–4.6)
Potassium: 4.5 mmol/L (ref 3.5–5.1)
Sodium: 139 mmol/L (ref 135–145)

## 2019-03-15 NOTE — Progress Notes (Signed)
[facility]  ROUNDING NOTE   Subjective:  Patient due for hemodialysis today.  Orders have been prepared. Patient sitting up in chair.    Objective:  Vital signs in last 24 hours:  Temperature 96.8 pulse 79 respirations 20 blood pressure 148/74  Physical Exam: General: No acute distress  Head: Normocephalic, atraumatic. Moist oral mucosal membranes  Eyes: Anicteric  Neck: Supple, trachea midline  Lungs:  Clear to auscultation, normal effort  Heart: S1S2 no rubs  Abdomen:  Soft, nontender, bowel sounds present  Extremities: Trace peripheral edema.  Neurologic: Awake, alert, following commands  Skin: No lesions  Access: Right IJ PermCath    Basic Metabolic Panel: Recent Labs  Lab 03/08/19 0915 03/08/19 0915 03/10/19 0527 03/13/19 0734 03/15/19 0539  NA 132*  --  139 141 139  K 4.1  --  4.5 4.7 4.5  CL 91*  --  98 98 98  CO2 27  --  23 25 26   GLUCOSE 145*  --  124* 93 126*  BUN 54*  --  54* 95* 60*  CREATININE 3.92*  --  4.36* 5.70* 4.25*  CALCIUM 9.3   < > 10.0 9.7 9.8  PHOS 6.2*  --  5.0* 4.5 4.4   < > = values in this interval not displayed.    Liver Function Tests: Recent Labs  Lab 03/08/19 0915 03/10/19 0527 03/13/19 0734 03/15/19 0539  ALBUMIN 3.0* 2.9* 2.8* 3.0*   No results for input(s): LIPASE, AMYLASE in the last 168 hours. No results for input(s): AMMONIA in the last 168 hours.  CBC: Recent Labs  Lab 03/08/19 0915 03/10/19 0527 03/13/19 0734 03/15/19 0539  WBC 12.5* 11.2* 8.0 7.7  HGB 10.1* 10.1* 8.4* 9.3*  HCT 31.6* 32.2* 28.3* 30.5*  MCV 98.1 99.7 102.9* 101.7*  PLT 92* 77* 121* 108*    Cardiac Enzymes: No results for input(s): CKTOTAL, CKMB, CKMBINDEX, TROPONINI in the last 168 hours.  BNP: Invalid input(s): POCBNP  CBG: No results for input(s): GLUCAP in the last 168 hours.  Microbiology: Results for orders placed or performed during the hospital encounter of 02/23/19  Novel Coronavirus, NAA (hospital order;  send-out to ref lab)     Status: Abnormal   Collection Time: 03/08/19  4:30 PM   Specimen: Nasopharyngeal Swab; Respiratory  Result Value Ref Range Status   SARS-CoV-2, NAA DETECTED (A) NOT DETECTED Final    Comment: RESULT CALLED TO, READ BACK BY AND VERIFIED WITH: Rosalyn Charters RN 03/10/19 1806 JDW (NOTE)                  Client Requested Flag This nucleic acid amplification test was developed and its performance characteristics determined by [employer]. Nucleic acid amplification tests include RT-PCR and TMA. This test has not been FDA cleared or approved. This test has been authorized by FDA under an Emergency Use Authorization (EUA). This test is only authorized for the duration of time the declaration that circumstances exist justifying the authorization of the emergency use of in vitro diagnostic tests for detection of SARS-CoV-2 virus and/or diagnosis of COVID-19 infection under section 564(b)(1) of the Act, 21 U.S.C. [id number]) (1), unless the authorization is terminated or revoked sooner. When diagnostic testing is negative, the possibility of a false negative result should be considered in the context of a patient's recent exposures and the presence of clinical si gns and symptoms consistent with COVID-19. An individual without symptoms of COVID- 19 and who is not shedding SARS-CoV-2 virus would expect  to have a negative (not detected) result in this assay. Performed At: [facility] [address] [place], [place] [id number] Rush Farmer MD [id number]    [place]  Final    Comment: Performed at [facility], [place] [address] [place], [place] [address]  Novel Coronavirus, NAA (hospital order; send-out to ref lab)     Status: Abnormal   Collection Time: 03/10/19  3:55 PM   Specimen: Nasopharyngeal Swab; Respiratory  Result Value Ref Range Status   SARS-CoV-2, NAA DETECTED (A) NOT DETECTED Final    Comment: RESULT CALLED  TO, READ BACK BY AND VERIFIED WITH: C. NELSON RN, AT [id number] 03/13/19 BY D. VANHOOK (NOTE)                  Client Requested Flag This nucleic acid amplification test was developed and its performance characteristics determined by [employer]. Nucleic acid amplification tests include RT-PCR and TMA. This test has not been FDA cleared or approved. This test has been authorized by FDA under an Emergency Use Authorization (EUA). This test is only authorized for the duration of time the declaration that circumstances exist justifying the authorization of the emergency use of in vitro diagnostic tests for detection of SARS-CoV-2 virus and/or diagnosis of COVID-19 infection under section 564(b)(1) of the Act, 21 U.S.C. [id number]) (1), unless the authorization is terminated or revoked sooner. When diagnostic testing is negative, the possibility of a false negative result should be considered in the context of a patient's recent exposures and the presence o f clinical signs and symptoms consistent with COVID-19. An individual without symptoms of COVID- 19 and who is not shedding SARS-CoV-2 virus would expect to have a negative (not detected) result in this assay. Performed At: [facility] [address] [place], [place] [id number] Rush Farmer MD [id number]    [place]  Final    Comment: Performed at [facility], [place] [address], [place], [place] [address]    Coagulation Studies: No results for input(s): LABPROT, INR in the last 72 hours.  Urinalysis: No results for input(s): COLORURINE, LABSPEC, PHURINE, GLUCOSEU, HGBUR, BILIRUBINUR, KETONESUR, PROTEINUR, UROBILINOGEN, NITRITE, LEUKOCYTESUR in the last 72 hours.  Invalid input(s): APPERANCEUR    Imaging: IR US CHEST  Result Date: 03/14/2019 CLINICAL DATA:  Concern for right effusion by chest x-ray EXAM: CHEST ULTRASOUND COMPARISON:  03/11/2019 chest x-ray FINDINGS: Survey ultrasound  demonstrates a trace amount of pleural fluid, not enough to warrant thoracentesis. Procedure not performed. IMPRESSION: Trace right pleural effusion. Electronically Signed   By: Jerilynn Mages.  Shick M.D.   On: 03/14/2019 15:07     Medications:       Assessment/ Plan:  74 y.o. female with a PMHx of COPD, diabetes mellitus type 2, ESRD on HD, history of TIA, recent COVID-19 infection who was admitted [facility] on 02/23/2019 for ongoing care.   1.  ESRD on HD.  Patient due for dialysis treatment today per her usual schedule.  Ultrafiltration target 1.5 kg.  2.  Hypotension.  Continue midodrine for blood pressure support.  3.  Anemia of chronic kidney disease.  Hemoglobin up to 9.3.  Maintain the patient on Retacrit.  4.  Secondary hyperparathyroidism.  Phosphorus remains at target at 4.4.  Continue to monitor.   LOS: 0 Estle Sabella 2/17/20218:13 AM

## 2019-03-17 LAB — BLOOD GAS, ARTERIAL
Acid-Base Excess: 1.4 mmol/L (ref 0.0–2.0)
Bicarbonate: 26.8 mmol/L (ref 20.0–28.0)
FIO2: 40
O2 Saturation: 98.1 %
Patient temperature: 37
pCO2 arterial: 53.3 mmHg — ABNORMAL HIGH (ref 32.0–48.0)
pH, Arterial: 7.322 — ABNORMAL LOW (ref 7.350–7.450)
pO2, Arterial: 104 mmHg (ref 83.0–108.0)

## 2019-03-17 LAB — CBC
HCT: 28.4 % — ABNORMAL LOW (ref 36.0–46.0)
Hemoglobin: 8.6 g/dL — ABNORMAL LOW (ref 12.0–15.0)
MCH: 31.3 pg (ref 26.0–34.0)
MCHC: 30.3 g/dL (ref 30.0–36.0)
MCV: 103.3 fL — ABNORMAL HIGH (ref 80.0–100.0)
Platelets: 122 10*3/uL — ABNORMAL LOW (ref 150–400)
RBC: 2.75 MIL/uL — ABNORMAL LOW (ref 3.87–5.11)
RDW: 19.6 % — ABNORMAL HIGH (ref 11.5–15.5)
WBC: 9.3 10*3/uL (ref 4.0–10.5)
nRBC: 0.2 % (ref 0.0–0.2)

## 2019-03-17 LAB — RENAL FUNCTION PANEL
Albumin: 3.2 g/dL — ABNORMAL LOW (ref 3.5–5.0)
Anion gap: 16 — ABNORMAL HIGH (ref 5–15)
BUN: 61 mg/dL — ABNORMAL HIGH (ref 8–23)
CO2: 25 mmol/L (ref 22–32)
Calcium: 9.7 mg/dL (ref 8.9–10.3)
Chloride: 98 mmol/L (ref 98–111)
Creatinine, Ser: 4.91 mg/dL — ABNORMAL HIGH (ref 0.44–1.00)
GFR calc Af Amer: 9 mL/min — ABNORMAL LOW (ref >60)
GFR calc non Af Amer: 8 mL/min — ABNORMAL LOW (ref >60)
Glucose, Bld: 123 mg/dL — ABNORMAL HIGH (ref 70–99)
Phosphorus: 5.3 mg/dL — ABNORMAL HIGH (ref 2.5–4.6)
Potassium: 4.5 mmol/L (ref 3.5–5.1)
Sodium: 139 mmol/L (ref 135–145)

## 2019-03-17 NOTE — Progress Notes (Signed)
[facility]  ROUNDING NOTE   Subjective:  Patient seen and evaluated at bedside. She is seen during dialysis treatment. Tolerating well.    Objective:  Vital signs in last 24 hours:  Temperature 97.9 pulse 75 respirations 20 blood pressure 142/81  Physical Exam: General: No acute distress  Head: Normocephalic, atraumatic. Moist oral mucosal membranes  Eyes: Anicteric  Neck: Supple, trachea midline  Lungs:  Clear to auscultation, normal effort  Heart: S1S2 no rubs  Abdomen:  Soft, nontender, bowel sounds present  Extremities: Trace peripheral edema.  Neurologic: Awake, alert, following commands  Skin: No lesions  Access: Right IJ PermCath    Basic Metabolic Panel: Recent Labs  Lab 03/13/19 0734 03/15/19 0539 03/17/19 0602  NA 141 139 139  K 4.7 4.5 4.5  CL 98 98 98  CO2 25 26 25   GLUCOSE 93 126* 123*  BUN 95* 60* 61*  CREATININE 5.70* 4.25* 4.91*  CALCIUM 9.7 9.8 9.7  PHOS 4.5 4.4 5.3*    Liver Function Tests: Recent Labs  Lab 03/13/19 0734 03/15/19 0539 03/17/19 0602  ALBUMIN 2.8* 3.0* 3.2*   No results for input(s): LIPASE, AMYLASE in the last 168 hours. No results for input(s): AMMONIA in the last 168 hours.  CBC: Recent Labs  Lab 03/13/19 0734 03/15/19 0539 03/17/19 0602  WBC 8.0 7.7 9.3  HGB 8.4* 9.3* 8.6*  HCT 28.3* 30.5* 28.4*  MCV 102.9* 101.7* 103.3*  PLT 121* 108* 122*    Cardiac Enzymes: No results for input(s): CKTOTAL, CKMB, CKMBINDEX, TROPONINI in the last 168 hours.  BNP: Invalid input(s): POCBNP  CBG: No results for input(s): GLUCAP in the last 168 hours.  Microbiology: Results for orders placed or performed during the hospital encounter of 02/23/19  Novel Coronavirus, NAA (hospital order; send-out to ref lab)     Status: Abnormal   Collection Time: 03/08/19  4:30 PM   Specimen: Nasopharyngeal Swab; Respiratory  Result Value Ref Range Status   SARS-CoV-2, NAA DETECTED (A) NOT DETECTED Final    Comment:  RESULT CALLED TO, READ BACK BY AND VERIFIED WITH: Rosalyn Charters RN 03/10/19 1806 JDW (NOTE)                  Client Requested Flag This nucleic acid amplification test was developed and its performance characteristics determined by [employer]. Nucleic acid amplification tests include RT-PCR and TMA. This test has not been FDA cleared or approved. This test has been authorized by FDA under an Emergency Use Authorization (EUA). This test is only authorized for the duration of time the declaration that circumstances exist justifying the authorization of the emergency use of in vitro diagnostic tests for detection of SARS-CoV-2 virus and/or diagnosis of COVID-19 infection under section 564(b)(1) of the Act, 21 U.S.C. [id number]) (1), unless the authorization is terminated or revoked sooner. When diagnostic testing is negative, the possibility of a false negative result should be considered in the context of a patient's recent exposures and the presence of clinical si gns and symptoms consistent with COVID-19. An individual without symptoms of COVID- 19 and who is not shedding SARS-CoV-2 virus would expect to have a negative (not detected) result in this assay. Performed At: [facility] [address] [place], [place] [id number] Rush Farmer MD [id number]    [place]  Final    Comment: Performed at [facility], [place] [address] [place], [place] [address]  Novel Coronavirus, NAA (hospital order; send-out to ref lab)  Status: Abnormal   Collection Time: 03/10/19  3:55 PM   Specimen: Nasopharyngeal Swab; Respiratory  Result Value Ref Range Status   SARS-CoV-2, NAA DETECTED (A) NOT DETECTED Final    Comment: RESULT CALLED TO, READ BACK BY AND VERIFIED WITH: C. NELSON RN, AT [id number] 03/13/19 BY D. VANHOOK (NOTE)                  Client Requested Flag This nucleic acid amplification test was developed and its performance characteristics  determined by [employer]. Nucleic acid amplification tests include RT-PCR and TMA. This test has not been FDA cleared or approved. This test has been authorized by FDA under an Emergency Use Authorization (EUA). This test is only authorized for the duration of time the declaration that circumstances exist justifying the authorization of the emergency use of in vitro diagnostic tests for detection of SARS-CoV-2 virus and/or diagnosis of COVID-19 infection under section 564(b)(1) of the Act, 21 U.S.C. [id number]) (1), unless the authorization is terminated or revoked sooner. When diagnostic testing is negative, the possibility of a false negative result should be considered in the context of a patient's recent exposures and the presence o f clinical signs and symptoms consistent with COVID-19. An individual without symptoms of COVID- 19 and who is not shedding SARS-CoV-2 virus would expect to have a negative (not detected) result in this assay. Performed At: [facility] [address] [place], [place] [id number] Rush Farmer MD [id number]    [place]  Final    Comment: Performed at [facility], [place] [address] [place], [place] [address]    Coagulation Studies: No results for input(s): LABPROT, INR in the last 72 hours.  Urinalysis: No results for input(s): COLORURINE, LABSPEC, PHURINE, GLUCOSEU, HGBUR, BILIRUBINUR, KETONESUR, PROTEINUR, UROBILINOGEN, NITRITE, LEUKOCYTESUR in the last 72 hours.  Invalid input(s): APPERANCEUR    Imaging: No results found.   Medications:       Assessment/ Plan:  74 y.o. female with a PMHx of COPD, diabetes mellitus type 2, ESRD on HD, history of TIA, recent COVID-19 infection who was admitted to [facility] on 02/23/2019 for ongoing care.   1.  ESRD on HD.  Patient seen during dialysis treatment.  Tolerating well.  Next office treatment will be on Monday.  2.  Hypotension.  Maintain the  patient on midodrine as well as albumin for blood pressure support during dialysis treatments.  3.  Anemia of chronic kidney disease.  Hemoglobin down a bit today to 8.6.  Maintain the patient on Retacrit.  4.  Secondary hyperparathyroidism.  Phosphorus within acceptable range at 5.3.  Continue to monitor.   LOS: 0 Uzziah Rigg 2/19/20219:15 AM

## 2019-03-29 ENCOUNTER — Encounter: Payer: Self-pay | Admitting: Nurse Practitioner

## 2019-03-29 ENCOUNTER — Other Ambulatory Visit: Payer: Self-pay

## 2019-03-29 ENCOUNTER — Telehealth (INDEPENDENT_AMBULATORY_CARE_PROVIDER_SITE_OTHER): Payer: Medicare Other | Admitting: Nurse Practitioner

## 2019-03-29 VITALS — BP 138/72 | HR 81 | Temp 97.8°F | Ht 61.0 in | Wt 200.0 lb

## 2019-03-29 DIAGNOSIS — J9611 Chronic respiratory failure with hypoxia: Secondary | ICD-10-CM | POA: Diagnosis not present

## 2019-03-29 DIAGNOSIS — E1142 Type 2 diabetes mellitus with diabetic polyneuropathy: Secondary | ICD-10-CM | POA: Diagnosis not present

## 2019-03-29 DIAGNOSIS — J449 Chronic obstructive pulmonary disease, unspecified: Secondary | ICD-10-CM

## 2019-03-29 DIAGNOSIS — R1310 Dysphagia, unspecified: Secondary | ICD-10-CM | POA: Insufficient documentation

## 2019-03-29 DIAGNOSIS — Z794 Long term (current) use of insulin: Secondary | ICD-10-CM

## 2019-03-29 DIAGNOSIS — I5032 Chronic diastolic (congestive) heart failure: Secondary | ICD-10-CM | POA: Diagnosis not present

## 2019-03-29 DIAGNOSIS — Z9981 Dependence on supplemental oxygen: Secondary | ICD-10-CM

## 2019-03-29 DIAGNOSIS — R1314 Dysphagia, pharyngoesophageal phase: Secondary | ICD-10-CM

## 2019-03-29 NOTE — Progress Notes (Addendum)
Virtual Visit via Video Note  I connected with@ on 03/29/19 at  2:00 PM EST by a video enabled telemedicine application and verified that I am speaking with the correct person using two identifiers.  Location: Patient:Home Provider: Office Participants: patient, son and daughter and provider   I discussed the limitations of evaluation and management by telemedicine and the availability of in person appointments. I also discussed with the patient that there may be a patient responsible charge related to this service. The patient expressed understanding and agreed to proceed.  [id number] f/up,  to [facility]  History of Present Illness: [name] and [name] provided answers to questions due to difficulty communicating with Ms. Breale, Wilkens report worsening hearing loss since hospital discharge. Denies any ear pain or drainage Discharge from [facility] 03/17/2019. Non productive cough, no dyspnea, no SOB without exertion. Use of liquid mucinex and benzonatate with relief. Diagnosed with Dysphagia during hospital stay, current diet is soft and honey thick liquids. Home glucose: 92, 102, 133, 120. (no hypoglycemia, insulin was discontinued). Dialysis schedule T, Th, Sat. Wt Readings from Last 3 Encounters:  03/29/19 200 lb (90.7 kg)  02/21/19 202 lb 6.1 oz (91.8 kg)  12/27/18 207 lb 3.7 oz (94 kg)   BP Readings from Last 3 Encounters:  03/29/19 138/72  02/22/19 133/79  02/08/19 138/72  Home PT 2x/week, Speech therapy 2x/week, nurse visit 1x/week  denies any skin breakdown Inquire about referral to pulmonologist, but dana and mark will like to talk to [name]) first.   medication list was reconciled with [name] Observations/Objective: Physical Exam  Cardiovascular: Normal rate.  Pulmonary/Chest: Effort normal.  Neurological: She is alert.   Reviewed medication and problem list, hospital lab results and radiology report Assessment and Plan: K[place]ie was seen today for  hospitalization follow-up.  Diagnoses and all orders for this visit:  Chronic diastolic (congestive) heart failure (HCC)  Chronic respiratory failure with hypoxia, on home O2 therapy (HCC)  Chronic obstructive pulmonary disease, unspecified COPD type ([place])  Type 2 diabetes mellitus with diabetic polyneuropathy, with long-term current use of insulin ([place])  Pharyngoesophageal dysphagia   Follow Up Instructions: See avs   I discussed the assessment and treatment plan with the patient. The patient was provided an opportunity to ask questions and all were answered. The patient agreed with the plan and demonstrated an understanding of the instructions.   The patient was advised to call back or seek an in-person evaluation if the symptoms worsen or if the condition fails to improve as anticipated.  Wilfred Lacy, NP

## 2019-03-29 NOTE — Assessment & Plan Note (Signed)
Soft diet with thickened liquids

## 2019-04-06 ENCOUNTER — Encounter: Payer: Self-pay | Admitting: Nurse Practitioner

## 2019-04-06 NOTE — Patient Instructions (Signed)
F/up in office for eval of ears and blood draw.

## 2019-04-10 ENCOUNTER — Encounter: Payer: Self-pay | Admitting: Nurse Practitioner

## 2019-04-10 ENCOUNTER — Other Ambulatory Visit: Payer: Self-pay

## 2019-04-10 ENCOUNTER — Ambulatory Visit: Payer: Medicare Other | Admitting: Nurse Practitioner

## 2019-04-10 ENCOUNTER — Ambulatory Visit (INDEPENDENT_AMBULATORY_CARE_PROVIDER_SITE_OTHER): Payer: Medicare Other | Admitting: Nurse Practitioner

## 2019-04-10 VITALS — BP 122/80 | HR 86 | Temp 96.8°F | Wt 205.8 lb

## 2019-04-10 DIAGNOSIS — N186 End stage renal disease: Secondary | ICD-10-CM

## 2019-04-10 DIAGNOSIS — E039 Hypothyroidism, unspecified: Secondary | ICD-10-CM

## 2019-04-10 DIAGNOSIS — E1122 Type 2 diabetes mellitus with diabetic chronic kidney disease: Secondary | ICD-10-CM | POA: Diagnosis not present

## 2019-04-10 DIAGNOSIS — I5032 Chronic diastolic (congestive) heart failure: Secondary | ICD-10-CM

## 2019-04-10 DIAGNOSIS — Z794 Long term (current) use of insulin: Secondary | ICD-10-CM

## 2019-04-10 DIAGNOSIS — H6123 Impacted cerumen, bilateral: Secondary | ICD-10-CM

## 2019-04-10 DIAGNOSIS — F418 Other specified anxiety disorders: Secondary | ICD-10-CM

## 2019-04-10 DIAGNOSIS — N184 Chronic kidney disease, stage 4 (severe): Secondary | ICD-10-CM | POA: Diagnosis not present

## 2019-04-10 DIAGNOSIS — E782 Mixed hyperlipidemia: Secondary | ICD-10-CM

## 2019-04-10 LAB — CBC WITH DIFFERENTIAL/PLATELET
Basophils Absolute: 0 10*3/uL (ref 0.0–0.1)
Basophils Relative: 0.4 % (ref 0.0–3.0)
Eosinophils Absolute: 0 10*3/uL (ref 0.0–0.7)
Eosinophils Relative: 0.1 % (ref 0.0–5.0)
HCT: 32.9 % — ABNORMAL LOW (ref 36.0–46.0)
Hemoglobin: 10.5 g/dL — ABNORMAL LOW (ref 12.0–15.0)
Lymphocytes Relative: 10.6 % — ABNORMAL LOW (ref 12.0–46.0)
Lymphs Abs: 1 10*3/uL (ref 0.7–4.0)
MCHC: 32 g/dL (ref 30.0–36.0)
MCV: 98 fl (ref 78.0–100.0)
Monocytes Absolute: 0.4 10*3/uL (ref 0.1–1.0)
Monocytes Relative: 4.3 % (ref 3.0–12.0)
Neutro Abs: 8.3 10*3/uL — ABNORMAL HIGH (ref 1.4–7.7)
Neutrophils Relative %: 84.6 % — ABNORMAL HIGH (ref 43.0–77.0)
Platelets: 137 10*3/uL — ABNORMAL LOW (ref 150.0–400.0)
RBC: 3.36 Mil/uL — ABNORMAL LOW (ref 3.87–5.11)
RDW: 18.5 % — ABNORMAL HIGH (ref 11.5–15.5)
WBC: 9.8 10*3/uL (ref 4.0–10.5)

## 2019-04-10 LAB — HEMOGLOBIN A1C: Hgb A1c MFr Bld: 5.4 % (ref 4.6–6.5)

## 2019-04-10 LAB — T4, FREE: Free T4: 1.04 ng/dL (ref 0.60–1.60)

## 2019-04-10 LAB — BRAIN NATRIURETIC PEPTIDE: Pro B Natriuretic peptide (BNP): 81 pg/mL (ref 0.0–100.0)

## 2019-04-10 LAB — TSH: TSH: 9.32 u[IU]/mL — ABNORMAL HIGH (ref 0.35–4.50)

## 2019-04-10 NOTE — Patient Instructions (Addendum)
Stable Hgb A1c. No need for insulin Elevated TSH and normal T4. Please ensure levothyroxine is taken on an empty stomach. Maintain current dose. Will need to repeat labs in 30months. Stable cbc Normal BNP

## 2019-04-10 NOTE — Progress Notes (Signed)
Subjective:  Patient ID: Joan Mosley, female    DOB: Mar 07, 1945  Age: 74 y.o. MRN: [id number]  CC: Follow-up (hearing loss, HTN and DM)  HPI Hearing loss: Worsening per [name], she thinks it is related to cerumen impaction. [name] is not interested in audiology referral at this time. She denies any ear pain or drainage.  Hypotension: stable Use of midodrine during dialysis BP Readings from Last 3 Encounters:  04/10/19 122/80  03/29/19 138/72  02/22/19 133/79   DM: Daughter denies any hypoglycemia. No medication use at this time.  Reviewed past Medical, Social and Family history today.  Outpatient Medications Prior to Visit  Medication Sig Dispense Refill  . acetaminophen (TYLENOL) 500 MG tablet Take 1,000 mg by mouth every 6 (six) hours as needed (for arthritic pain).     Marland Kitchen ascorbic acid (VITAMIN C) 500 MG tablet Take 1 tablet (500 mg total) by mouth daily.    . benzonatate (TESSALON) 200 MG capsule Take 1 capsule (200 mg total) by mouth 2 (two) times daily as needed for cough. 30 capsule 0  . calcitRIOL (ROCALTROL) 0.25 MCG capsule Take 7 capsules (1.75 mcg total) by mouth Every Tuesday,Thursday,and Saturday with dialysis.    Marland Kitchen clonazePAM (KLONOPIN) 0.5 MG tablet 0.25mg  in AM and 0.5mg  in PM (Patient taking differently: Take 0.25-0.5 mg by mouth 2 (two) times daily. 0.25mg  in AM and 0.5mg  in PM) 45 tablet 0  . Darbepoetin Alfa (ARANESP) 25 MCG/0.42ML SOSY injection Inject 0.42 mLs (25 mcg total) into the vein every Tuesday with hemodialysis. 14.28 mL   . docusate sodium (COLACE) 100 MG capsule Take 100 mg by mouth every morning.    Marland Kitchen FLUoxetine (PROZAC) 10 MG tablet Take 1 tablet (10 mg total) by mouth daily. 90 tablet 3  . guaiFENesin (MUCINEX) 600 MG 12 hr tablet Take 1 tablet (600 mg total) by mouth 2 (two) times daily.    . heparin 5000 UNIT/ML injection Inject 1.5 mLs (7,500 Units total) into the skin every 8 (eight) hours. 1 mL   . ipratropium-albuterol (DUONEB)  0.5-2.5 (3) MG/3ML SOLN Take 3 mLs by nebulization See admin instructions. Nebulize and inhale 3 ml's into the lungs every four hours, while awake    . Lubricants (K-Y LUBRICANT JELLY SENSITIVE EX) Place 1 application into both nostrils as needed (as directed- for lubrication).     . midodrine (PROAMATINE) 10 MG tablet Take 10 mg by mouth See admin instructions. Take 10 mg by mouth on Tuesday, Thursday, Saturday 45 minutes before dialysis    . montelukast (SINGULAIR) 10 MG tablet Take 10 mg by mouth at bedtime.     . multivitamin (RENA-VIT) TABS tablet Take 1 tablet by mouth at bedtime.  0  . NASAL SPRAY SALINE NA Place 1-2 sprays into both nostrils as needed (for congestion- "Arm and Hammer Simply Saline" brand).    . Nutritional Supplements (FEEDING SUPPLEMENT, NEPRO CARB STEADY,) LIQD Take 237 mLs by mouth 2 (two) times daily between meals.  0  . nystatin (MYCOSTATIN/NYSTOP) powder Apply 1 g topically 2 (two) times daily as needed (as directed- to any rashes).     . nystatin cream (MYCOSTATIN) Apply 1 application topically 2 (two) times daily as needed for dry skin.     Marland Kitchen oxymetazoline (AFRIN) 0.05 % nasal spray Place 1 spray into both nostrils 2 (two) times daily as needed for congestion.    . sevelamer carbonate (RENVELA) 800 MG tablet Take 1 tablet (800 mg total) by mouth as needed (  with snacks).    . VENTOLIN HFA 108 (90 Base) MCG/ACT inhaler Inhale 2 puffs into the lungs every 6 (six) hours as needed for wheezing or shortness of breath. 18 g 2  . levothyroxine (SYNTHROID) 112 MCG tablet Take 1 tablet (112 mcg total) by mouth daily before breakfast. 1 AM 90 tablet 0  . rosuvastatin (CRESTOR) 40 MG tablet Take 1 tablet (40 mg total) by mouth at bedtime. 90 tablet 1  . OVER THE COUNTER MEDICATION Stool softner otc    . senna (SENOKOT) 8.6 MG TABS tablet Take 1 tablet by mouth every morning.     No facility-administered medications prior to visit.    ROS See HPI  Objective:  BP 122/80 (BP  Location: Left Arm, Patient Position: Sitting, Cuff Size: Normal)   Pulse 86   Temp (!) 96.8 F (36 C) (Temporal)   Wt 205 lb 12.8 oz (93.4 kg)   SpO2 100%   BMI 38.89 kg/m   BP Readings from Last 3 Encounters:  04/10/19 122/80  03/29/19 138/72  02/22/19 133/79    Wt Readings from Last 3 Encounters:  04/10/19 205 lb 12.8 oz (93.4 kg)  03/29/19 200 lb (90.7 kg)  02/21/19 202 lb 6.1 oz (91.8 kg)    Physical Exam Vitals reviewed.  Constitutional:      Appearance: She is obese.  HENT:     Right Ear: There is impacted cerumen.     Left Ear: There is impacted cerumen.  Cardiovascular:     Rate and Rhythm: Normal rate and regular rhythm.     Pulses: Normal pulses.     Heart sounds: Normal heart sounds.  Pulmonary:     Effort: Pulmonary effort is normal.     Breath sounds: Normal breath sounds.  Abdominal:     General: Bowel sounds are normal. There is no distension.     Palpations: Abdomen is soft.     Tenderness: There is no abdominal tenderness.  Musculoskeletal:     Right lower leg: Edema present.     Left lower leg: Edema present.  Neurological:     Mental Status: She is alert and oriented to person, place, and time.    Lab Results  Component Value Date   WBC 9.8 04/10/2019   HGB 10.5 (L) 04/10/2019   HCT 32.9 (L) 04/10/2019   PLT 137.0 (L) 04/10/2019   GLUCOSE 123 (H) 03/17/2019   CHOL 164 07/01/2018   TRIG 202 (H) 07/01/2018   HDL 78 07/01/2018   LDLCALC 59 07/01/2018   ALT 13 02/23/2019   AST 12 (L) 02/23/2019   NA 139 03/17/2019   K 4.5 03/17/2019   CL 98 03/17/2019   CREATININE 4.91 (H) 03/17/2019   BUN 61 (H) 03/17/2019   CO2 25 03/17/2019   TSH 9.32 (H) 04/10/2019   INR 1.1 09/08/2018   HGBA1C 5.4 04/10/2019   Procedure Note :   Procedure :  Cerumen removal  Indication:  Hearing loss due to Cerumen impaction   Risks, including pain, dizziness, eardrum perforation, bleeding, infection and others as well as benefits were explained to the  patient in detail. Verbal consent was obtained and the patient agreed to proceed.   A large amount wax was recovered with ear loop. She was unable to tolerate ear irrigation.  Tolerated well. Complications: None.  Postprocedure instructions :  Call if problems.   Assessment & Plan:  This visit occurred during the SARS-CoV-2 public health emergency.  Safety protocols were in place,  including screening questions prior to the visit, additional usage of staff PPE, and extensive cleaning of exam room while observing appropriate contact time as indicated for disinfecting solutions.   Winda was seen today for follow-up.  Diagnoses and all orders for this visit:  Type 2 diabetes mellitus with stage 4 chronic kidney disease, with long-term current use of insulin (HCC) -     Hemoglobin A1c  Hypothyroidism, unspecified type -     TSH -     T4, free -     levothyroxine (SYNTHROID) 112 MCG tablet; Take 1 tablet (112 mcg total) by mouth daily before breakfast. 1 AM  Depression with anxiety  Chronic diastolic (congestive) heart failure (HCC) -     B Nat Peptide  ESRD (end stage renal disease) (HCC) -     CBC w/Diff  Bilateral hearing loss due to cerumen impaction  Mixed hyperlipidemia -     rosuvastatin (CRESTOR) 20 MG tablet; Take 1 tablet (20 mg total) by mouth at bedtime.   I have changed Fronie Liford's rosuvastatin. I am also having her maintain her montelukast, nystatin, nystatin cream, midodrine, acetaminophen, senna, docusate sodium, calcitRIOL, Darbepoetin Alfa, feeding supplement (NEPRO CARB STEADY), multivitamin, ipratropium-albuterol, Lubricants (K-Y LUBRICANT JELLY SENSITIVE EX), oxymetazoline, NASAL SPRAY SALINE NA, OVER THE COUNTER MEDICATION, FLUoxetine, clonazePAM, benzonatate, Ventolin HFA, ascorbic acid, guaiFENesin, heparin, sevelamer carbonate, and levothyroxine.  Meds ordered this encounter  Medications  . rosuvastatin (CRESTOR) 20 MG tablet    Sig: Take 1 tablet (20  mg total) by mouth at bedtime.    Dispense:  90 tablet    Refill:  3    Change in dose    Order Specific Question:   Supervising Provider    Answer:   Ronnald Nian [[id number]]  . levothyroxine (SYNTHROID) 112 MCG tablet    Sig: Take 1 tablet (112 mcg total) by mouth daily before breakfast. 1 AM    Dispense:  90 tablet    Refill:  1    Order Specific Question:   Supervising Provider    Answer:   Ronnald Nian [[id number]]    Problem List Items Addressed This Visit      Cardiovascular and Mediastinum   Chronic diastolic (congestive) heart failure (HCC)   Relevant Medications   rosuvastatin (CRESTOR) 20 MG tablet   Other Relevant Orders   B Nat Peptide (Completed)     Endocrine   Hypothyroidism   Relevant Medications   levothyroxine (SYNTHROID) 112 MCG tablet   Other Relevant Orders   TSH (Completed)   T4, free (Completed)   Type II diabetes mellitus with renal manifestations ([place]) - Primary   Relevant Medications   rosuvastatin (CRESTOR) 20 MG tablet   Other Relevant Orders   Hemoglobin A1c (Completed)     Genitourinary   ESRD (end stage renal disease) ([place])   Relevant Orders   CBC w/Diff (Completed)     Other   Depression with anxiety   HLD (hyperlipidemia)   Relevant Medications   rosuvastatin (CRESTOR) 20 MG tablet    Other Visit Diagnoses    Bilateral hearing loss due to cerumen impaction           Follow-up: Return in about 3 months (around 07/11/2019) for DM and hypothyroidism (F2F, 61mins).  Wilfred Lacy, NP

## 2019-04-13 MED ORDER — LEVOTHYROXINE SODIUM 112 MCG PO TABS: 112.0000 ug | ORAL_TABLET | Freq: Every day | ORAL | 1 refills | Status: DC

## 2019-04-13 MED ORDER — ROSUVASTATIN CALCIUM 20 MG PO TABS: 20.0000 mg | ORAL_TABLET | Freq: Every day | ORAL | 3 refills | Status: DC

## 2019-04-15 ENCOUNTER — Other Ambulatory Visit: Payer: Self-pay

## 2019-04-15 ENCOUNTER — Emergency Department (HOSPITAL_COMMUNITY): Payer: Medicare Other

## 2019-04-15 ENCOUNTER — Emergency Department (HOSPITAL_COMMUNITY)
Admission: EM | Admit: 2019-04-15 | Discharge: 2019-04-16 | Disposition: A | Discharge status: Home / Self Care | Payer: Medicare Other | Attending: Emergency Medicine | Admitting: Emergency Medicine

## 2019-04-15 ENCOUNTER — Encounter (HOSPITAL_COMMUNITY): Payer: Self-pay

## 2019-04-15 DIAGNOSIS — Z8673 Personal history of transient ischemic attack (TIA), and cerebral infarction without residual deficits: Secondary | ICD-10-CM | POA: Diagnosis not present

## 2019-04-15 DIAGNOSIS — J449 Chronic obstructive pulmonary disease, unspecified: Secondary | ICD-10-CM | POA: Diagnosis not present

## 2019-04-15 DIAGNOSIS — Z87891 Personal history of nicotine dependence: Secondary | ICD-10-CM | POA: Diagnosis not present

## 2019-04-15 DIAGNOSIS — Z96651 Presence of right artificial knee joint: Secondary | ICD-10-CM | POA: Diagnosis not present

## 2019-04-15 DIAGNOSIS — Z992 Dependence on renal dialysis: Secondary | ICD-10-CM | POA: Insufficient documentation

## 2019-04-15 DIAGNOSIS — E1140 Type 2 diabetes mellitus with diabetic neuropathy, unspecified: Secondary | ICD-10-CM | POA: Insufficient documentation

## 2019-04-15 DIAGNOSIS — D539 Nutritional anemia, unspecified: Secondary | ICD-10-CM | POA: Diagnosis not present

## 2019-04-15 DIAGNOSIS — Z96652 Presence of left artificial knee joint: Secondary | ICD-10-CM | POA: Diagnosis not present

## 2019-04-15 DIAGNOSIS — R410 Disorientation, unspecified: Secondary | ICD-10-CM | POA: Diagnosis not present

## 2019-04-15 DIAGNOSIS — E1122 Type 2 diabetes mellitus with diabetic chronic kidney disease: Secondary | ICD-10-CM | POA: Insufficient documentation

## 2019-04-15 DIAGNOSIS — Z9981 Dependence on supplemental oxygen: Secondary | ICD-10-CM | POA: Diagnosis not present

## 2019-04-15 DIAGNOSIS — E039 Hypothyroidism, unspecified: Secondary | ICD-10-CM | POA: Diagnosis not present

## 2019-04-15 DIAGNOSIS — R0989 Other specified symptoms and signs involving the circulatory and respiratory systems: Secondary | ICD-10-CM | POA: Insufficient documentation

## 2019-04-15 DIAGNOSIS — Z20822 Contact with and (suspected) exposure to covid-19: Secondary | ICD-10-CM | POA: Insufficient documentation

## 2019-04-15 DIAGNOSIS — Z79899 Other long term (current) drug therapy: Secondary | ICD-10-CM | POA: Diagnosis not present

## 2019-04-15 DIAGNOSIS — N186 End stage renal disease: Secondary | ICD-10-CM | POA: Diagnosis not present

## 2019-04-15 DIAGNOSIS — R4182 Altered mental status, unspecified: Secondary | ICD-10-CM | POA: Diagnosis present

## 2019-04-15 LAB — COMPREHENSIVE METABOLIC PANEL
ALT: 16 U/L (ref 0–44)
AST: 21 U/L (ref 15–41)
Albumin: 2.8 g/dL — ABNORMAL LOW (ref 3.5–5.0)
Alkaline Phosphatase: 110 U/L (ref 38–126)
Anion gap: 16 — ABNORMAL HIGH (ref 5–15)
BUN: 10 mg/dL (ref 8–23)
CO2: 27 mmol/L (ref 22–32)
Calcium: 9.2 mg/dL (ref 8.9–10.3)
Chloride: 94 mmol/L — ABNORMAL LOW (ref 98–111)
Creatinine, Ser: 2.39 mg/dL — ABNORMAL HIGH (ref 0.44–1.00)
GFR calc Af Amer: 22 mL/min — ABNORMAL LOW (ref >60)
GFR calc non Af Amer: 19 mL/min — ABNORMAL LOW (ref >60)
Glucose, Bld: 249 mg/dL — ABNORMAL HIGH (ref 70–99)
Potassium: 4.1 mmol/L (ref 3.5–5.1)
Sodium: 137 mmol/L (ref 135–145)
Total Bilirubin: 1 mg/dL (ref 0.3–1.2)
Total Protein: 6.5 g/dL (ref 6.5–8.1)

## 2019-04-15 LAB — PROTIME-INR
INR: 1 (ref 0.8–1.2)
Prothrombin Time: 12.5 seconds (ref 11.4–15.2)

## 2019-04-15 LAB — CBC WITH DIFFERENTIAL/PLATELET
Abs Immature Granulocytes: 0.11 10*3/uL — ABNORMAL HIGH (ref 0.00–0.07)
Basophils Absolute: 0 10*3/uL (ref 0.0–0.1)
Basophils Relative: 0 %
Eosinophils Absolute: 0 10*3/uL (ref 0.0–0.5)
Eosinophils Relative: 0 %
HCT: 37.5 % (ref 36.0–46.0)
Hemoglobin: 11 g/dL — ABNORMAL LOW (ref 12.0–15.0)
Immature Granulocytes: 1 %
Lymphocytes Relative: 8 %
Lymphs Abs: 1.2 10*3/uL (ref 0.7–4.0)
MCH: 30.3 pg (ref 26.0–34.0)
MCHC: 29.3 g/dL — ABNORMAL LOW (ref 30.0–36.0)
MCV: 103.3 fL — ABNORMAL HIGH (ref 80.0–100.0)
Monocytes Absolute: 1.1 10*3/uL — ABNORMAL HIGH (ref 0.1–1.0)
Monocytes Relative: 7 %
Neutro Abs: 12.5 10*3/uL — ABNORMAL HIGH (ref 1.7–7.7)
Neutrophils Relative %: 84 %
Platelets: 177 10*3/uL (ref 150–400)
RBC: 3.63 MIL/uL — ABNORMAL LOW (ref 3.87–5.11)
RDW: 16.4 % — ABNORMAL HIGH (ref 11.5–15.5)
WBC: 14.9 10*3/uL — ABNORMAL HIGH (ref 4.0–10.5)
nRBC: 0 % (ref 0.0–0.2)

## 2019-04-15 LAB — CBG MONITORING, ED: Glucose-Capillary: 232 mg/dL — ABNORMAL HIGH (ref 70–99)

## 2019-04-15 LAB — ETHANOL: Alcohol, Ethyl (B): <10 mg/dL (ref <10)

## 2019-04-15 MED ORDER — SODIUM CHLORIDE 0.9 % IV SOLN: INTRAVENOUS | Status: DC

## 2019-04-15 MED ORDER — SODIUM CHLORIDE 0.9 % IV BOLUS
1000.0000 mL | Freq: Once | INTRAVENOUS | Status: AC
  Administered 2019-04-15: 1000 mL via INTRAVENOUS

## 2019-04-15 NOTE — ED Provider Notes (Signed)
[facility] EMERGENCY DEPARTMENT Provider Note   CSN: [id number] Arrival date & time: 04/15/19  2225     History Chief Complaint  Patient presents with  . Choking  . Altered Mental Status    Joan Mosley is a 74 y.o. female.  HPI   Patient presents with concern for altered mental status, choking. Patient herself has baseline cognitive dysfunction, per EMS report.  Patient herself cannot provide any details of what occurred, level 5 caveat.  Seemingly the patient had a normal dialysis session today, and she is reportedly typically altered for the duration of the day following one of the sessions.  Today in the hours prior to ED arrival the patient was reportedly eating dinner, when she had a choking event.  Patient is on home oxygen 24/7, 4 L.  Reported the patient had an episode of hypoxia, with a saturation less than 80% during the choking event, but was able to clear her airway.  Since the event the patient has had diminished interactivity, but has returned to full awakenings, but with no recall of the event itself. Past Medical History:  Diagnosis Date  . Arthritis   . COPD (chronic obstructive pulmonary disease) ([place])   . Diabetes mellitus without complication ([place])   . Diabetic neuropathy ([place])   . 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 Renal disorder    ESRD  . Thyroid disease   . TIA (transient ischemic attack)     Patient Active Problem List   Diagnosis Date Noted  . Dysphagia 03/29/2019  . Pneumonia due to COVID-19 virus 02/23/2019  . Aspiration into airway   . COVID-19 02/10/2019  . Pneumonia due to COVID-19 virus 02/09/2019  . Hypotension 12/24/2018  . Aspiration pneumonia of right lower lobe ([place])   . Palliative care encounter   . COPD (chronic obstructive pulmonary disease) ([place]) 11/11/2018  . TIA (transient ischemic attack)   . HLD (hyperlipidemia)   . Type II diabetes mellitus with renal manifestations ([place])   . Depression with  anxiety   . Acute on chronic respiratory failure with hypoxia ([place]) 09/24/2018  . Advanced care planning/counseling discussion   . Goals of care, counseling/discussion   . Palliative care by specialist   . Acute on chronic respiratory failure with hypoxia and hypercapnia ([place]) 09/15/2018  . Pulmonary edema 09/08/2018  . Respiratory failure ([place]) 09/08/2018  . Weakness   . SDH (subdural hematoma) ([place]) 09/06/2018  . Chronic respiratory failure with hypoxia, on home O2 therapy ([place]) 09/06/2018  . Subdural hematoma ([place]) 08/22/2018  . Elevated troponin 08/22/2018  . ESRD (end stage renal disease) ([place]) 08/22/2018  . Anxiety 07/04/2018  . Counseling regarding advanced directives and goals of care 07/01/2018  . Full code status 07/01/2018  . Drug-induced constipation 07/01/2018  . Hemodialysis-associated hypotension 07/01/2018  . Dialysis patient ([place]) 07/01/2018  . HOH (hard of hearing) 07/01/2018  . Stage 5 chronic kidney disease on chronic dialysis ([place]) 07/01/2018  . Type 2 diabetes mellitus with diabetic polyneuropathy, with long-term current use of insulin ([place]) 07/01/2018  . Hypothyroidism 07/01/2018  . Gait instability 07/01/2018  . Hyperkalemia 04/20/2018  . Cerebral infarction, unspecified ([place]) 04/05/2018  . Chronic diastolic (congestive) heart failure ([place]) 04/05/2018  . Diarrhea, unspecified 04/05/2018  . Pain, unspecified 04/05/2018  . Pruritus, unspecified 04/05/2018  . Shortness of breath 04/05/2018  . Coagulation defect, unspecified ([place]) 03/31/2018  . Anemia in chronic kidney disease 03/25/2018  . Iron deficiency anemia, unspecified 03/25/2018  .  Secondary hyperparathyroidism of renal origin ([place]) 03/25/2018    Past Surgical History:  Procedure Laterality Date  . ABDOMINAL HYSTERECTOMY    . IR FLUORO GUIDE CV LINE RIGHT  11/14/2018  . RECTOCELE REPAIR    . REPLACEMENT TOTAL KNEE BILATERAL Bilateral 2008  . THYROIDECTOMY     80%  . VAGINAL WOUND CLOSURE /  REPAIR       OB History   No obstetric history on file.     Family History  Problem Relation Age of Onset  . Heart disease Mother   . Hypertension Mother   . Heart disease Father   . Hypertension Father     Social History   Tobacco Use  . Smoking status: Former Smoker    Packs/day: 1.00    Years: 35.00    Pack years: 35.00    Quit date: 03/29/1981    Years since quitting: 38.0  . Smokeless tobacco: Never Used  Substance Use Topics  . Alcohol use: Never  . Drug use: Never    Home Medications Prior to Admission medications   Medication Sig Start Date End Date Taking? Authorizing Provider  acetaminophen (TYLENOL) 500 MG tablet Take 1,000 mg by mouth every 6 (six) hours as needed (for arthritic pain).     [provider]  ascorbic acid (VITAMIN C) 500 MG tablet Take 1 tablet (500 mg total) by mouth daily. 02/23/19   Hosie Poisson, MD  benzonatate (TESSALON) 200 MG capsule Take 1 capsule (200 mg total) by mouth 2 (two) times daily as needed for cough. 02/08/19   Nche, Charlene Brooke, NP  calcitRIOL (ROCALTROL) 0.25 MCG capsule Take 7 capsules (1.75 mcg total) by mouth Every Tuesday,Thursday,and Saturday with dialysis. 11/22/18   Hosie Poisson, MD  clonazePAM (KLONOPIN) 0.5 MG tablet 0.25mg  in AM and 0.5mg  in PM Patient taking differently: Take 0.25-0.5 mg by mouth 2 (two) times daily. 0.25mg  in AM and 0.5mg  in PM 02/08/19   Nche, Charlene Brooke, NP  Darbepoetin Alfa (ARANESP) 25 MCG/0.42ML SOSY injection Inject 0.42 mLs (25 mcg total) into the vein every Tuesday with hemodialysis. 11/22/18   Hosie Poisson, MD  docusate sodium (COLACE) 100 MG capsule Take 100 mg by mouth every morning.    [provider]  FLUoxetine (PROZAC) 10 MG tablet Take 1 tablet (10 mg total) by mouth daily. 02/08/19   Nche, Charlene Brooke, NP  guaiFENesin (MUCINEX) 600 MG 12 hr tablet Take 1 tablet (600 mg total) by mouth 2 (two) times daily. 02/22/19   Hosie Poisson, MD  heparin 5000 UNIT/ML  injection Inject 1.5 mLs (7,500 Units total) into the skin every 8 (eight) hours. 02/22/19   Hosie Poisson, MD  ipratropium-albuterol (DUONEB) 0.5-2.5 (3) MG/3ML SOLN Take 3 mLs by nebulization See admin instructions. Nebulize and inhale 3 ml's into the lungs every four hours, while awake    [provider]  levothyroxine (SYNTHROID) 112 MCG tablet Take 1 tablet (112 mcg total) by mouth daily before breakfast. 1 AM 04/13/19   Nche, Charlene Brooke, NP  Lubricants (K-Y LUBRICANT JELLY SENSITIVE EX) Place 1 application into both nostrils as needed (as directed- for lubrication).     [provider]  midodrine (PROAMATINE) 10 MG tablet Take 10 mg by mouth See admin instructions. Take 10 mg by mouth on Tuesday, Thursday, Saturday 45 minutes before dialysis    [provider]  montelukast (SINGULAIR) 10 MG tablet Take 10 mg by mouth at bedtime.     [provider]  multivitamin (  RENA-VIT) TABS tablet Take 1 tablet by mouth at bedtime. 11/21/18   Hosie Poisson, MD  NASAL SPRAY SALINE NA Place 1-2 sprays into both nostrils as needed (for congestion- "Arm and Hammer Simply Saline" brand).    [provider]  Nutritional Supplements (FEEDING SUPPLEMENT, NEPRO CARB STEADY,) LIQD Take 237 mLs by mouth 2 (two) times daily between meals. 11/22/18   Hosie Poisson, MD  nystatin (MYCOSTATIN/NYSTOP) powder Apply 1 g topically 2 (two) times daily as needed (as directed- to any rashes).     [provider]  nystatin cream (MYCOSTATIN) Apply 1 application topically 2 (two) times daily as needed for dry skin.     [provider]  OVER THE COUNTER MEDICATION Stool softner otc    [provider]  oxymetazoline (AFRIN) 0.05 % nasal spray Place 1 spray into both nostrils 2 (two) times daily as needed for congestion.    [provider]  rosuvastatin (CRESTOR) 20 MG tablet Take 1 tablet (20 mg total) by mouth at bedtime. 04/13/19   Nche, Charlene Brooke, NP   senna (SENOKOT) 8.6 MG TABS tablet Take 1 tablet by mouth every morning.    [provider]  sevelamer carbonate (RENVELA) 800 MG tablet Take 1 tablet (800 mg total) by mouth as needed (with snacks). 02/22/19   Hosie Poisson, MD  VENTOLIN HFA 108 (90 Base) MCG/ACT inhaler Inhale 2 puffs into the lungs every 6 (six) hours as needed for wheezing or shortness of breath. 02/14/19   Nche, Charlene Brooke, NP    Allergies    Avelox [moxifloxacin hcl in nacl], Codeine, Penicillins, Sulfa antibiotics, Dextromethorphan-guaifenesin, and Shellfish allergy  Review of Systems   Review of Systems  Unable to perform ROS: Acuity of condition    Physical Exam Updated Vital Signs BP (!) 160/144   Pulse (!) 118   Temp 98.5 F (36.9 C) (Oral)   Resp (!) 29   SpO2 90%   Physical Exam Vitals and nursing note reviewed.  Constitutional:      Appearance: She is well-developed. She is obese. She is ill-appearing.     Comments: Ill-appearing obese elderly female interactive only when specifically spoken to, or interacted with.  HENT:     Head: Normocephalic and atraumatic.  Eyes:     Conjunctiva/sclera: Conjunctivae normal.  Cardiovascular:     Rate and Rhythm: Regular rhythm. Tachycardia present.  Pulmonary:     Effort: Pulmonary effort is normal. No respiratory distress.     Breath sounds: No stridor. Wheezing present.  Chest:    Abdominal:     General: There is no distension.     Tenderness: There is no abdominal tenderness. There is no guarding.  Skin:    General: Skin is warm and dry.  Neurological:     Cranial Nerves: No cranial nerve deficit.     Motor: Atrophy present.     Comments: Patient responds to direct stimuli otherwise is withdrawn.  She does move all extremities to command, and minimally spontaneously.  No gross facial asymmetry, speech is clear when she does speak.  Psychiatric:        Cognition and Memory: Cognition is impaired.     ED Results / Procedures /  Treatments   Labs (all labs ordered are listed, but only abnormal results are displayed) Labs Reviewed  SARS CORONAVIRUS 2 (TAT 6-24 HRS)  COMPREHENSIVE METABOLIC PANEL  RAPID URINE DRUG SCREEN, HOSP PERFORMED  ETHANOL  CBC WITH DIFFERENTIAL/PLATELET  PROTIME-INR  URINALYSIS, COMPLETE (UACMP) WITH  MICROSCOPIC  CBG MONITORING, ED    EKG Sinus tachycardia, rate 120, artifact, wander, not overtly ischemic.  Abnormal   Radiology No results found.  Procedures Procedures (including critical care time)  Medications Ordered in ED Medications  sodium chloride 0.9 % bolus 1,000 mL (has no administration in time range)    And  0.9 %  sodium chloride infusion (has no administration in time range)    ED Course  I have reviewed the triage vital signs and the nursing notes.  Pertinent labs & imaging results that were available during my care of the patient were reviewed by me and considered in my medical decision making (see chart for details). This elderly female presents after possible choking event.  Patient is reportedly typically somewhat altered after dialysis which she did have today, but now presents with concern for worsening mental status. Patient has requiring continued oxygen, though with seemingly somewhat depressed oxygen saturation values compared to those that she reportedly has on her 4 L of nasal cannula 24/7. Patient also found to have wheezing on physical exam.  With consideration of aspiration, increased work of breathing, altered mental status, the patient has labs, CT, x-ray ordered, pending.  Dr. Roxanne Mins is aware of the patient and her presentation. Final Clinical Impression(s) / ED Diagnoses Final diagnoses:  Delirium  Choking episode     Carmin Muskrat, MD 04/15/19 2350

## 2019-04-15 NOTE — ED Triage Notes (Signed)
Pt arrived via [facility] from home c/o altered mental status and choking. Family stated pt was eating tater tots when the pt got choked. Family called EMS, upon arrival ems stated pt was no longer choking but O2 saturations were in the 70's. Pt is normally on 3L [place] at home. Pt was placed on non rebreather with O2 saturations now 92%. Family states pt gets tired and wont answer questions. EMS stated pt seems altered. Pt also receives dialysis and went for a schedule visit today.

## 2019-04-15 NOTE — ED Notes (Signed)
Patient placed on pure wick °

## 2019-04-16 LAB — SARS CORONAVIRUS 2 (TAT 6-24 HRS): SARS Coronavirus 2: NEGATIVE

## 2019-04-16 NOTE — ED Notes (Signed)
[phone] pts son, POA [name] wants a call back

## 2019-04-16 NOTE — ED Notes (Signed)
[facility] called for pt 

## 2019-04-16 NOTE — ED Notes (Signed)
This RN discussed discharge with Son [name] and Daughter Beverlee Nims. Discharge instructions discussed. Family was able to verbalized understanding with no questions at this time.   [facility] called for transport home at this time. RN confirmed address with family. Family awaiting pt's return.

## 2019-04-16 NOTE — Discharge Instructions (Addendum)
Return if you are having any problems. 

## 2019-04-16 NOTE — ED Notes (Signed)
Pt oriented to Person, place and year. No lleads  In room for EKG looking for them now. Unable to pull off IV for tropin. Will try to get as soon as possible

## 2019-04-16 NOTE — ED Provider Notes (Signed)
Care assumed from Dr. Vanita Panda, patient with apparent aspiration episode at home, delirium which has resolved.  Chest x-ray shows no evidence of pneumonia.  Labs show chronic anemia which is actually improved over baseline.  I have gone back to evaluate the patient, she is resting comfortably maintaining oxygen saturations of 94% on 4 L nasal oxygen.  Per old records, she is actually on 5 L of nasal oxygen at baseline.  She is oriented to person and place but not time.  She feels that she is back to her baseline and is comfortable going home.  I have discussed the case with her son who is her power of attorney who is also comfortable with her being discharged.  Return precautions are given.   Delora Fuel, MD [id number]/23/[id number]

## 2019-04-18 ENCOUNTER — Other Ambulatory Visit: Payer: Self-pay | Admitting: Nurse Practitioner

## 2019-04-18 DIAGNOSIS — F419 Anxiety disorder, unspecified: Secondary | ICD-10-CM

## 2019-04-19 ENCOUNTER — Other Ambulatory Visit: Payer: Self-pay

## 2019-04-19 ENCOUNTER — Encounter (HOSPITAL_COMMUNITY): Payer: Self-pay | Admitting: *Deleted

## 2019-04-19 ENCOUNTER — Inpatient Hospital Stay (HOSPITAL_COMMUNITY)
Admission: EM | Admit: 2019-04-19 | Discharge: 2019-04-26 | DRG: 064 | Disposition: A | Discharge status: Skilled Nursing Facility | Payer: Medicare Other | Attending: Internal Medicine | Admitting: Internal Medicine

## 2019-04-19 ENCOUNTER — Emergency Department (HOSPITAL_COMMUNITY): Payer: Medicare Other

## 2019-04-19 DIAGNOSIS — R0902 Hypoxemia: Secondary | ICD-10-CM

## 2019-04-19 DIAGNOSIS — R627 Adult failure to thrive: Secondary | ICD-10-CM

## 2019-04-19 DIAGNOSIS — R5381 Other malaise: Secondary | ICD-10-CM | POA: Diagnosis present

## 2019-04-19 DIAGNOSIS — E785 Hyperlipidemia, unspecified: Secondary | ICD-10-CM | POA: Diagnosis present

## 2019-04-19 DIAGNOSIS — I5032 Chronic diastolic (congestive) heart failure: Secondary | ICD-10-CM | POA: Diagnosis present

## 2019-04-19 DIAGNOSIS — N2581 Secondary hyperparathyroidism of renal origin: Secondary | ICD-10-CM | POA: Diagnosis present

## 2019-04-19 DIAGNOSIS — E039 Hypothyroidism, unspecified: Secondary | ICD-10-CM | POA: Diagnosis present

## 2019-04-19 DIAGNOSIS — Z8673 Personal history of transient ischemic attack (TIA), and cerebral infarction without residual deficits: Secondary | ICD-10-CM

## 2019-04-19 DIAGNOSIS — Z7401 Bed confinement status: Secondary | ICD-10-CM

## 2019-04-19 DIAGNOSIS — R29810 Facial weakness: Secondary | ICD-10-CM | POA: Diagnosis present

## 2019-04-19 DIAGNOSIS — Z09 Encounter for follow-up examination after completed treatment for conditions other than malignant neoplasm: Secondary | ICD-10-CM

## 2019-04-19 DIAGNOSIS — E8889 Other specified metabolic disorders: Secondary | ICD-10-CM | POA: Diagnosis present

## 2019-04-19 DIAGNOSIS — R1310 Dysphagia, unspecified: Secondary | ICD-10-CM | POA: Diagnosis present

## 2019-04-19 DIAGNOSIS — I959 Hypotension, unspecified: Secondary | ICD-10-CM | POA: Diagnosis not present

## 2019-04-19 DIAGNOSIS — R410 Disorientation, unspecified: Secondary | ICD-10-CM | POA: Diagnosis not present

## 2019-04-19 DIAGNOSIS — Z823 Family history of stroke: Secondary | ICD-10-CM

## 2019-04-19 DIAGNOSIS — Z6835 Body mass index (BMI) 35.0-35.9, adult: Secondary | ICD-10-CM

## 2019-04-19 DIAGNOSIS — Z87891 Personal history of nicotine dependence: Secondary | ICD-10-CM

## 2019-04-19 DIAGNOSIS — F419 Anxiety disorder, unspecified: Secondary | ICD-10-CM

## 2019-04-19 DIAGNOSIS — Z79899 Other long term (current) drug therapy: Secondary | ICD-10-CM

## 2019-04-19 DIAGNOSIS — I639 Cerebral infarction, unspecified: Principal | ICD-10-CM | POA: Diagnosis present

## 2019-04-19 DIAGNOSIS — E669 Obesity, unspecified: Secondary | ICD-10-CM | POA: Diagnosis present

## 2019-04-19 DIAGNOSIS — Z9981 Dependence on supplemental oxygen: Secondary | ICD-10-CM

## 2019-04-19 DIAGNOSIS — F0390 Unspecified dementia without behavioral disturbance: Secondary | ICD-10-CM | POA: Diagnosis present

## 2019-04-19 DIAGNOSIS — J449 Chronic obstructive pulmonary disease, unspecified: Secondary | ICD-10-CM | POA: Diagnosis present

## 2019-04-19 DIAGNOSIS — J9611 Chronic respiratory failure with hypoxia: Secondary | ICD-10-CM

## 2019-04-19 DIAGNOSIS — Z8616 Personal history of COVID-19: Secondary | ICD-10-CM

## 2019-04-19 DIAGNOSIS — Z992 Dependence on renal dialysis: Secondary | ICD-10-CM

## 2019-04-19 DIAGNOSIS — Z794 Long term (current) use of insulin: Secondary | ICD-10-CM

## 2019-04-19 DIAGNOSIS — Z7189 Other specified counseling: Secondary | ICD-10-CM

## 2019-04-19 DIAGNOSIS — F418 Other specified anxiety disorders: Secondary | ICD-10-CM | POA: Diagnosis present

## 2019-04-19 DIAGNOSIS — D649 Anemia, unspecified: Secondary | ICD-10-CM | POA: Diagnosis present

## 2019-04-19 DIAGNOSIS — R29706 NIHSS score 6: Secondary | ICD-10-CM | POA: Diagnosis present

## 2019-04-19 DIAGNOSIS — I132 Hypertensive heart and chronic kidney disease with heart failure and with stage 5 chronic kidney disease, or end stage renal disease: Secondary | ICD-10-CM | POA: Diagnosis present

## 2019-04-19 DIAGNOSIS — G9340 Encephalopathy, unspecified: Secondary | ICD-10-CM | POA: Diagnosis present

## 2019-04-19 DIAGNOSIS — E1142 Type 2 diabetes mellitus with diabetic polyneuropathy: Secondary | ICD-10-CM | POA: Diagnosis present

## 2019-04-19 DIAGNOSIS — N186 End stage renal disease: Secondary | ICD-10-CM | POA: Diagnosis present

## 2019-04-19 DIAGNOSIS — Z96653 Presence of artificial knee joint, bilateral: Secondary | ICD-10-CM | POA: Diagnosis present

## 2019-04-19 DIAGNOSIS — E1122 Type 2 diabetes mellitus with diabetic chronic kidney disease: Secondary | ICD-10-CM | POA: Diagnosis present

## 2019-04-19 DIAGNOSIS — Z79890 Hormone replacement therapy (postmenopausal): Secondary | ICD-10-CM

## 2019-04-19 DIAGNOSIS — T17908A Unspecified foreign body in respiratory tract, part unspecified causing other injury, initial encounter: Secondary | ICD-10-CM

## 2019-04-19 DIAGNOSIS — E890 Postprocedural hypothyroidism: Secondary | ICD-10-CM | POA: Diagnosis present

## 2019-04-19 DIAGNOSIS — Z90710 Acquired absence of both cervix and uterus: Secondary | ICD-10-CM

## 2019-04-19 LAB — CBC WITH DIFFERENTIAL/PLATELET
Abs Immature Granulocytes: 0.1 10*3/uL — ABNORMAL HIGH (ref 0.00–0.07)
Basophils Absolute: 0 10*3/uL (ref 0.0–0.1)
Basophils Relative: 0 %
Eosinophils Absolute: 0 10*3/uL (ref 0.0–0.5)
Eosinophils Relative: 0 %
HCT: 37.4 % (ref 36.0–46.0)
Hemoglobin: 11 g/dL — ABNORMAL LOW (ref 12.0–15.0)
Immature Granulocytes: 1 %
Lymphocytes Relative: 16 %
Lymphs Abs: 1.6 10*3/uL (ref 0.7–4.0)
MCH: 29.8 pg (ref 26.0–34.0)
MCHC: 29.4 g/dL — ABNORMAL LOW (ref 30.0–36.0)
MCV: 101.4 fL — ABNORMAL HIGH (ref 80.0–100.0)
Monocytes Absolute: 0.9 10*3/uL (ref 0.1–1.0)
Monocytes Relative: 9 %
Neutro Abs: 7.1 10*3/uL (ref 1.7–7.7)
Neutrophils Relative %: 74 %
Platelets: 181 10*3/uL (ref 150–400)
RBC: 3.69 MIL/uL — ABNORMAL LOW (ref 3.87–5.11)
RDW: 16.6 % — ABNORMAL HIGH (ref 11.5–15.5)
WBC: 9.8 10*3/uL (ref 4.0–10.5)
nRBC: 0 % (ref 0.0–0.2)

## 2019-04-19 LAB — CBG MONITORING, ED: Glucose-Capillary: 132 mg/dL — ABNORMAL HIGH (ref 70–99)

## 2019-04-19 NOTE — ED Triage Notes (Signed)
Pt from home by EMS for confusion. Family reported last dialysis treatment was on Saturday; was scheduled on Tuesday but refused treatment after arriving to facility. EMS reported hx of pneumonia and UTIs in the past. Was seen 3/20 for choking episode. Pt alert to place and name.

## 2019-04-19 NOTE — ED Notes (Addendum)
Pt refused rectal Temperature; EDP made aware. Pt also refused CT Scan and Blood Work initially but after conversing with the pt, pt agreed to both

## 2019-04-19 NOTE — ED Notes (Signed)
Pt transported to CT ?

## 2019-04-19 NOTE — ED Provider Notes (Signed)
[facility] EMERGENCY DEPARTMENT Provider Note   CSN: [id number] Arrival date & time: 04/19/19  2147     History Chief Complaint  Patient presents with  . Altered Mental Status    Joan Mosley is a 74 y.o. female with a history of hard of hearing, chronic respiratory failure on 5 L home O2, ESRD on HD (T/R/S), chronic diastolic congestive heart failure, COPD, diabetes mellitus type 2, CVA, TIA, and subdural hematoma who presents to the emergency department from home via EMS with altered mental status.  Family reports that after dinner the patient began complaining of a headache.  They also report that she became more confused this afternoon.  Last seen normal between 14:00-15:00.  Family reports that typically she is alert and oriented x4.  However, since this afternoon she is only intermittently been able to answer questions appropriately.  Family also notes that since she began complaining of a headache that she also seemed to be favoring her right side more than usual.  She has had a history of aspiration pneumonia, no recent choking episodes.  No recent fever or chills.  No nausea, vomiting, or diarrhea.  She was last dialyzed on 3/20.  Family reports that the transportation service picked her up for her dialysis appointment yesterday and after the patient arrived at the facility she refused treatment and was brought home without receiving dialysis.   Family reports that the patient has not ambulated in approximately 3 years.  She was able to use a bedside toilet prior to having COVID-19 in January 2021.  However, since she was discharged 2 months ago she has essentially been bedbound.  To transfer the patient to dialysis, she requires a full lift.  Patient is a full code.  She still produces urine.  Level 5 caveat secondary to altered mental status.  The history is provided by the patient. No language interpreter was used.       Past Medical History:    Diagnosis Date  . Arthritis   . COPD (chronic obstructive pulmonary disease) ([place])   . Diabetes mellitus without complication ([place])   . Diabetic neuropathy ([place])   . 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 Renal disorder    ESRD  . Thyroid disease   . TIA (transient ischemic attack)     Patient Active Problem List   Diagnosis Date Noted  . Delirium 04/20/2019  . Dysphagia 03/29/2019  . Pneumonia due to COVID-19 virus 02/23/2019  . Aspiration into airway   . COVID-19 02/10/2019  . Pneumonia due to COVID-19 virus 02/09/2019  . Hypotension 12/24/2018  . Aspiration pneumonia of right lower lobe ([place])   . Palliative care encounter   . COPD (chronic obstructive pulmonary disease) ([place]) 11/11/2018  . TIA (transient ischemic attack)   . HLD (hyperlipidemia)   . Type II diabetes mellitus with renal manifestations ([place])   . Depression with anxiety   . Acute on chronic respiratory failure with hypoxia ([place]) 09/24/2018  . Advanced care planning/counseling discussion   . Goals of care, counseling/discussion   . Palliative care by specialist   . Acute on chronic respiratory failure with hypoxia and hypercapnia ([place]) 09/15/2018  . Pulmonary edema 09/08/2018  . Respiratory failure ([place]) 09/08/2018  . Weakness   . SDH (subdural hematoma) ([place]) 09/06/2018  . Chronic respiratory failure with hypoxia, on home O2 therapy ([place]) 09/06/2018  . Subdural hematoma ([place]) 08/22/2018  . Elevated troponin 08/22/2018  . ESRD (end stage renal  disease) ([place]) 08/22/2018  . Anxiety 07/04/2018  . Counseling regarding advanced directives and goals of care 07/01/2018  . Full code status 07/01/2018  . Drug-induced constipation 07/01/2018  . Hemodialysis-associated hypotension 07/01/2018  . Dialysis patient ([place]) 07/01/2018  . HOH (hard of hearing) 07/01/2018  . Stage 5 chronic kidney disease on chronic dialysis ([place]) 07/01/2018  . Type 2 diabetes mellitus with diabetic polyneuropathy, with long-term  current use of insulin ([place]) 07/01/2018  . Hypothyroidism 07/01/2018  . Gait instability 07/01/2018  . Hyperkalemia 04/20/2018  . Cerebral infarction, unspecified ([place]) 04/05/2018  . Chronic diastolic (congestive) heart failure ([place]) 04/05/2018  . Diarrhea, unspecified 04/05/2018  . Pain, unspecified 04/05/2018  . Pruritus, unspecified 04/05/2018  . Shortness of breath 04/05/2018  . Coagulation defect, unspecified ([place]) 03/31/2018  . Anemia in chronic kidney disease 03/25/2018  . Iron deficiency anemia, unspecified 03/25/2018  . Secondary hyperparathyroidism of renal origin ([place]) 03/25/2018    Past Surgical History:  Procedure Laterality Date  . ABDOMINAL HYSTERECTOMY    . IR FLUORO GUIDE CV LINE RIGHT  11/14/2018  . RECTOCELE REPAIR    . REPLACEMENT TOTAL KNEE BILATERAL Bilateral 2008  . THYROIDECTOMY     80%  . VAGINAL WOUND CLOSURE / REPAIR       OB History   No obstetric history on file.     Family History  Problem Relation Age of Onset  . Heart disease Mother   . Hypertension Mother   . Heart disease Father   . Hypertension Father     Social History   Tobacco Use  . Smoking status: Former Smoker    Packs/day: 1.00    Years: 35.00    Pack years: 35.00    Quit date: 03/29/1981    Years since quitting: 38.0  . Smokeless tobacco: Never Used  Substance Use Topics  . Alcohol use: Never  . Drug use: Never    Home Medications Prior to Admission medications   Medication Sig Start Date End Date Taking? Authorizing Provider  acetaminophen (TYLENOL) 500 MG tablet Take 1,000 mg by mouth every 6 (six) hours as needed (for arthritic pain).    Yes [provider]  calcitRIOL (ROCALTROL) 0.25 MCG capsule Take 7 capsules (1.75 mcg total) by mouth Every Tuesday,Thursday,and Saturday with dialysis. 11/22/18  Yes Hosie Poisson, MD  clonazePAM (KLONOPIN) 0.5 MG tablet TAKE 1/2 (ONE-HALF) TABLET BY MOUTH IN THE MORNING AND 1 IN THE EVENING 04/18/19  Yes Nche, Charlene Brooke, NP  FLUoxetine (PROZAC) 10 MG tablet Take 1 tablet (10 mg total) by mouth daily. 02/08/19  Yes Nche, Charlene Brooke, NP  food thickener (THICK IT) POWD Take by mouth as needed (drinks).   Yes [provider]  furosemide (LASIX) 80 MG tablet Take 80 mg by mouth.   Yes [provider]  guaiFENesin (MUCINEX) 600 MG 12 hr tablet Take 1 tablet (600 mg total) by mouth 2 (two) times daily. 02/22/19  Yes Hosie Poisson, MD  insulin detemir (LEVEMIR) 100 UNIT/ML injection Inject 15 Units into the skin every morning.   Yes [provider]  insulin lispro (HUMALOG) 100 UNIT/ML injection Inject into the skin 3 (three) times daily before meals. Per sliding scale   Yes [provider]  ipratropium-albuterol (DUONEB) 0.5-2.5 (3) MG/3ML SOLN Take 3 mLs by nebulization See admin instructions. Nebulize and inhale 3 ml's into the lungs every four hours, while awake   Yes [provider]  levalbuterol (XOPENEX HFA) 45 MCG/ACT inhaler Inhale 1-2 puffs  into the lungs every 4 (four) hours as needed for wheezing.   Yes [provider]  levothyroxine (SYNTHROID) 112 MCG tablet Take 1 tablet (112 mcg total) by mouth daily before breakfast. 1 AM 04/13/19  Yes Nche, Charlene Brooke, NP  lidocaine (LIDODERM) 5 % Place 1 patch onto the skin daily as needed (pain). Remove & Discard patch within 12 hours or as directed by MD   Yes [provider]  midodrine (PROAMATINE) 10 MG tablet Take 10 mg by mouth See admin instructions. Take 10 mg by mouth on Tuesday, Thursday, Saturday 45 minutes before dialysis   Yes [provider]  montelukast (SINGULAIR) 10 MG tablet Take 10 mg by mouth at bedtime.    Yes [provider]  nystatin (MYCOSTATIN/NYSTOP) powder Apply 1 g topically 2 (two) times daily as needed (as directed- to any rashes).    Yes [provider]  nystatin cream (MYCOSTATIN) Apply 1 application topically 2 (two) times daily as needed for dry  skin.    Yes [provider]  rosuvastatin (CRESTOR) 20 MG tablet Take 1 tablet (20 mg total) by mouth at bedtime. 04/13/19  Yes Nche, Charlene Brooke, NP  ascorbic acid (VITAMIN C) 500 MG tablet Take 1 tablet (500 mg total) by mouth daily. 02/23/19   Hosie Poisson, MD  benzonatate (TESSALON) 200 MG capsule Take 1 capsule (200 mg total) by mouth 2 (two) times daily as needed for cough. 02/08/19   Nche, Charlene Brooke, NP  Darbepoetin Alfa (ARANESP) 25 MCG/0.42ML SOSY injection Inject 0.42 mLs (25 mcg total) into the vein every Tuesday with hemodialysis. 11/22/18   Hosie Poisson, MD  docusate sodium (COLACE) 100 MG capsule Take 100 mg by mouth every morning.    [provider]  Lubricants (K-Y LUBRICANT JELLY SENSITIVE EX) Place 1 application into both nostrils as needed (as directed- for lubrication).     [provider]  multivitamin (RENA-VIT) TABS tablet Take 1 tablet by mouth at bedtime. 11/21/18   Hosie Poisson, MD  NASAL SPRAY SALINE NA Place 1-2 sprays into both nostrils as needed (for congestion- "[employer] Simply Saline" brand).    [provider]  Nutritional Supplements (FEEDING SUPPLEMENT, NEPRO CARB STEADY,) LIQD Take 237 mLs by mouth 2 (two) times daily between meals. 11/22/18   Hosie Poisson, MD  OVER THE COUNTER MEDICATION Stool softner otc    [provider]  oxymetazoline (AFRIN) 0.05 % nasal spray Place 1 spray into both nostrils 2 (two) times daily as needed for congestion.    [provider]  senna (SENOKOT) 8.6 MG TABS tablet Take 1 tablet by mouth every morning.    [provider]  sevelamer carbonate (RENVELA) 800 MG tablet Take 1 tablet (800 mg total) by mouth as needed (with snacks). 02/22/19   Hosie Poisson, MD  VENTOLIN HFA 108 (90 Base) MCG/ACT inhaler Inhale 2 puffs into the lungs every 6 (six) hours as needed for wheezing or shortness of breath. 02/14/19   Nche, Charlene Brooke, NP  heparin 5000 UNIT/ML injection  Inject 1.5 mLs (7,500 Units total) into the skin every 8 (eight) hours. Patient not taking: Reported on 04/20/2019 02/22/19 04/20/19  Hosie Poisson, MD    Allergies    Avelox [moxifloxacin hcl in nacl], Codeine, Penicillins, Sulfa antibiotics, Dextromethorphan-guaifenesin, and Shellfish allergy  Review of Systems   Review of Systems  Unable to perform ROS: Mental status change    Physical Exam Updated Vital Signs BP 140/88   Pulse 94  Temp 98.3 F (36.8 C)   Resp (!) 22   SpO2 94%   Physical Exam Vitals and nursing note reviewed.  Constitutional:      General: She is not in acute distress.    Comments: Chronically ill-appearing, obese elderly female. Nasal cannula in place.  HENT:     Head: Normocephalic.  Eyes:     Conjunctiva/sclera: Conjunctivae normal.  Cardiovascular:     Rate and Rhythm: Normal rate and regular rhythm.     Heart sounds: No murmur. No friction rub. No gallop.   Pulmonary:     Effort: Pulmonary effort is normal. No respiratory distress.     Comments: Lungs are clear to auscultation bilaterally. Abdominal:     General: There is no distension.     Palpations: Abdomen is soft.     Comments: Abdomen is obese, soft, nontender, nondistended.  Musculoskeletal:     Cervical back: Neck supple.  Skin:    General: Skin is warm.     Findings: No rash.  Neurological:     Mental Status: She is alert.     Comments: States the year is 2020.  States the state is [place].  She thinks the month is November.  She does not respond when asked the current president of the [place].  She is oriented to her first and last name.  5 out of 5 strength against resistance of the bilateral upper and lower extremities.  No pronator drift.  Sensation is intact and equal throughout.  Follows simple commands.  Cranial nerves II through XII are grossly intact.  Patient does not ambulate.   Psychiatric:        Behavior: Behavior normal.     ED Results / Procedures /  Treatments   Labs (all labs ordered are listed, but only abnormal results are displayed) Labs Reviewed  CBC WITH DIFFERENTIAL/PLATELET - Abnormal; Notable for the following components:      Result Value   RBC 3.69 (*)    Hemoglobin 11.0 (*)    MCV 101.4 (*)    MCHC 29.4 (*)    RDW 16.6 (*)    Abs Immature Granulocytes 0.10 (*)    All other components within normal limits  COMPREHENSIVE METABOLIC PANEL - Abnormal; Notable for the following components:   Chloride 96 (*)    Glucose, Bld 136 (*)    BUN 37 (*)    Creatinine, Ser 7.69 (*)    Total Protein 6.2 (*)    Albumin 2.7 (*)    AST 47 (*)    GFR calc non Af Amer 5 (*)    GFR calc Af Amer 5 (*)    Anion gap 18 (*)    All other components within normal limits  CBG MONITORING, ED - Abnormal; Notable for the following components:   Glucose-Capillary 132 (*)    All other components within normal limits  URINALYSIS, COMPLETE (UACMP) WITH MICROSCOPIC    EKG EKG Interpretation  Date/Time:  Wednesday April 19 2019 22:46:04 EDT Ventricular Rate:  100 PR Interval:    QRS Duration: 96 QT Interval:  344 QTC Calculation: 444 R Axis:   -19 Text Interpretation: Sinus tachycardia Multiple ventricular premature complexes Borderline left axis deviation Borderline T abnormalities, anterior leads Interpretation limited secondary to artifact Confirmed by Ripley Fraise [phone]) on 04/19/2019 11:15:34 PM   Radiology CT Head Wo Contrast  Result Date: 04/19/2019 CLINICAL DATA:  Encephalopathy, confusion, diabetes EXAM: CT HEAD WITHOUT CONTRAST TECHNIQUE: Contiguous axial images were obtained  from the base of the skull through the vertex without intravenous contrast. COMPARISON:  04/15/2019 FINDINGS: Brain: Chronic small-vessel ischemic changes throughout the periventricular white matter and central pons. No acute infarct or hemorrhage. Lateral ventricles and midline structures are unremarkable. Stable diffuse cerebral atrophy. No acute  extra-axial fluid collections. Stable bilateral subdural hygromas along the convexities. No mass effect. Vascular: No hyperdense vessel or unexpected calcification. Skull: Normal. Negative for fracture or focal lesion. Sinuses/Orbits: No acute finding. Other: None IMPRESSION: 1. Stable exam, no acute process. Electronically Signed   By: Randa Ngo M.D.   On: 04/19/2019 23:25   DG Chest Portable 1 View  Result Date: 04/19/2019 CLINICAL DATA:  Altered mental status EXAM: PORTABLE CHEST 1 VIEW COMPARISON:  April 15, 2019 FINDINGS: There is unchanged cardiomegaly, the patient is slightly rotated. Aortic calcifications are seen. A right-sided central venous catheter with the tip just entering the right atrium. Again noted are mildly increased interstitial markings throughout both lungs, not significantly changed since the prior exam. There is also stable right basilar small pleural effusion. No acute osseous abnormality. IMPRESSION: Unchanged mildly increased interstitial markings throughout both lungs which could be due to chronic lung changes or mild interstitial edema. Trace right pleural effusion. Electronically Signed   By: Prudencio Pair M.D.   On: 04/19/2019 22:48    Procedures Procedures (including critical care time)  Medications Ordered in ED Medications - No data to display  ED Course  I have reviewed the triage vital signs and the nursing notes.  Pertinent labs & imaging results that were available during my care of the patient were reviewed by me and considered in my medical decision making (see chart for details).    MDM Rules/Calculators/A&P                      74 year old female with a history of hard of hearing, chronic respiratory failure on 5 L home O2, ESRD on HD (T/R/S), chronic diastolic congestive heart failure, COPD, diabetes mellitus type 2, CVA, TIA, and subdural hematoma who presents to the emergency department from home with altered mental status, headache.  Family was  concerned that the patient was favoring her right side.  Last seen normal between 14:00-15:00.   On initial exam, she was only oriented to self.  She believed the year to be 2020, month November, state [place].   CT head is unremarkable.  Doubt CVA.  The patient did miss her last dialysis session on Tuesday and his creatinine is elevated, but she does not appear volume overloaded and has no electrolyte abnormalities that may be contributing to her altered mental status.  Emergent hemodialysis is not indicated at this time.  The patient does have a history of aspiration pneumonia, but no recent choking episodes.  Chest x-ray with unchanged mildly increased interstitial markings throughout both lungs thought to be chronic.  Patient is satting in her baseline on her 5 L of home O2.  She has no increased work of breathing.  Lungs are clear to auscultation bilaterally.  Doubt pneumonia.  Patient has had UTIs in the past.  Nursing staff attempted catheterization, but unable to obtain urine.  Patient is currently on a pure wick and urinalysis is pending.  She is denying urinary complaints at this time.  The patient has been seen and independently evaluated by Dr. Christy Gentles, attending physician.  Spoke with the patient's sons [name] and [name] who are her POA's.  They express concern about the patient's continual  decline since her hospitalization in January.  They do not feel that she is able to properly be cared for at home especially since she is bedridden and unable to ambulate.  They would like to discuss placement options.  When asked, the patient would like to stay at home.  However, I do not feel that the patient has capacity to make this decision as mental status has waxed and waned multiple times in the ER, concerning for delirium.  Consult to the hospitalist team and Dr. Myna Hidalgo will accept the patient for admission. The patient appears reasonably stabilized for admission considering the current  resources, flow, and capabilities available in the ED at this time, and I doubt any other [facility] requiring further screening and/or treatment in the ED prior to admission.  Final Clinical Impression(s) / ED Diagnoses Final diagnoses:  Delirium    Rx / DC Orders ED Discharge Orders    None       Joanne Gavel, PA-C 04/20/19 0234    Ripley Fraise, MD 04/20/19 [phone]

## 2019-04-19 NOTE — ED Provider Notes (Signed)
Patient seen/examined in the Emergency Department in conjunction with Advanced Practice Provider [place] Patient presents for generalized weakness and altered mental status Exam : awake/alert but appears confused.  She is in no acute distress Plan: AMS workup ongoing at this time     Ripley Fraise, MD 04/19/19 2332

## 2019-04-20 ENCOUNTER — Encounter (HOSPITAL_COMMUNITY): Payer: Self-pay | Admitting: Family Medicine

## 2019-04-20 ENCOUNTER — Inpatient Hospital Stay (HOSPITAL_COMMUNITY): Payer: Medicare Other

## 2019-04-20 ENCOUNTER — Other Ambulatory Visit: Payer: Self-pay

## 2019-04-20 ENCOUNTER — Observation Stay (HOSPITAL_COMMUNITY): Payer: Medicare Other

## 2019-04-20 DIAGNOSIS — G9340 Encephalopathy, unspecified: Secondary | ICD-10-CM | POA: Diagnosis present

## 2019-04-20 DIAGNOSIS — I6389 Other cerebral infarction: Secondary | ICD-10-CM | POA: Diagnosis not present

## 2019-04-20 DIAGNOSIS — I5032 Chronic diastolic (congestive) heart failure: Secondary | ICD-10-CM | POA: Diagnosis present

## 2019-04-20 DIAGNOSIS — Z7401 Bed confinement status: Secondary | ICD-10-CM | POA: Diagnosis not present

## 2019-04-20 DIAGNOSIS — Z79899 Other long term (current) drug therapy: Secondary | ICD-10-CM | POA: Diagnosis not present

## 2019-04-20 DIAGNOSIS — F0390 Unspecified dementia without behavioral disturbance: Secondary | ICD-10-CM | POA: Diagnosis present

## 2019-04-20 DIAGNOSIS — N2581 Secondary hyperparathyroidism of renal origin: Secondary | ICD-10-CM | POA: Diagnosis present

## 2019-04-20 DIAGNOSIS — Z8673 Personal history of transient ischemic attack (TIA), and cerebral infarction without residual deficits: Secondary | ICD-10-CM | POA: Diagnosis not present

## 2019-04-20 DIAGNOSIS — E1122 Type 2 diabetes mellitus with diabetic chronic kidney disease: Secondary | ICD-10-CM | POA: Diagnosis present

## 2019-04-20 DIAGNOSIS — I639 Cerebral infarction, unspecified: Secondary | ICD-10-CM | POA: Diagnosis present

## 2019-04-20 DIAGNOSIS — J9611 Chronic respiratory failure with hypoxia: Secondary | ICD-10-CM

## 2019-04-20 DIAGNOSIS — F418 Other specified anxiety disorders: Secondary | ICD-10-CM

## 2019-04-20 DIAGNOSIS — Z79890 Hormone replacement therapy (postmenopausal): Secondary | ICD-10-CM | POA: Diagnosis not present

## 2019-04-20 DIAGNOSIS — R29810 Facial weakness: Secondary | ICD-10-CM | POA: Diagnosis present

## 2019-04-20 DIAGNOSIS — Z992 Dependence on renal dialysis: Secondary | ICD-10-CM | POA: Diagnosis not present

## 2019-04-20 DIAGNOSIS — Z87891 Personal history of nicotine dependence: Secondary | ICD-10-CM | POA: Diagnosis not present

## 2019-04-20 DIAGNOSIS — R410 Disorientation, unspecified: Secondary | ICD-10-CM | POA: Diagnosis present

## 2019-04-20 DIAGNOSIS — Z9981 Dependence on supplemental oxygen: Secondary | ICD-10-CM

## 2019-04-20 DIAGNOSIS — E1142 Type 2 diabetes mellitus with diabetic polyneuropathy: Secondary | ICD-10-CM

## 2019-04-20 DIAGNOSIS — J449 Chronic obstructive pulmonary disease, unspecified: Secondary | ICD-10-CM | POA: Diagnosis present

## 2019-04-20 DIAGNOSIS — E890 Postprocedural hypothyroidism: Secondary | ICD-10-CM | POA: Diagnosis present

## 2019-04-20 DIAGNOSIS — Z96653 Presence of artificial knee joint, bilateral: Secondary | ICD-10-CM | POA: Diagnosis present

## 2019-04-20 DIAGNOSIS — Z515 Encounter for palliative care: Secondary | ICD-10-CM | POA: Diagnosis not present

## 2019-04-20 DIAGNOSIS — Z794 Long term (current) use of insulin: Secondary | ICD-10-CM

## 2019-04-20 DIAGNOSIS — Z7189 Other specified counseling: Secondary | ICD-10-CM | POA: Diagnosis not present

## 2019-04-20 DIAGNOSIS — E8889 Other specified metabolic disorders: Secondary | ICD-10-CM | POA: Diagnosis present

## 2019-04-20 DIAGNOSIS — N186 End stage renal disease: Secondary | ICD-10-CM

## 2019-04-20 DIAGNOSIS — E039 Hypothyroidism, unspecified: Secondary | ICD-10-CM

## 2019-04-20 DIAGNOSIS — I132 Hypertensive heart and chronic kidney disease with heart failure and with stage 5 chronic kidney disease, or end stage renal disease: Secondary | ICD-10-CM | POA: Diagnosis present

## 2019-04-20 DIAGNOSIS — Z90710 Acquired absence of both cervix and uterus: Secondary | ICD-10-CM | POA: Diagnosis not present

## 2019-04-20 DIAGNOSIS — Z8616 Personal history of COVID-19: Secondary | ICD-10-CM | POA: Diagnosis not present

## 2019-04-20 DIAGNOSIS — R627 Adult failure to thrive: Secondary | ICD-10-CM | POA: Diagnosis not present

## 2019-04-20 LAB — COMPREHENSIVE METABOLIC PANEL
ALT: 15 U/L (ref 0–44)
ALT: 15 U/L (ref 0–44)
AST: 47 U/L — ABNORMAL HIGH (ref 15–41)
AST: 48 U/L — ABNORMAL HIGH (ref 15–41)
Albumin: 2.7 g/dL — ABNORMAL LOW (ref 3.5–5.0)
Albumin: 2.6 g/dL — ABNORMAL LOW (ref 3.5–5.0)
Alkaline Phosphatase: 107 U/L (ref 38–126)
Alkaline Phosphatase: 108 U/L (ref 38–126)
Anion gap: 18 — ABNORMAL HIGH (ref 5–15)
Anion gap: 15 (ref 5–15)
BUN: 37 mg/dL — ABNORMAL HIGH (ref 8–23)
BUN: 38 mg/dL — ABNORMAL HIGH (ref 8–23)
CO2: 27 mmol/L (ref 22–32)
CO2: 28 mmol/L (ref 22–32)
Calcium: 9.6 mg/dL (ref 8.9–10.3)
Calcium: 9.6 mg/dL (ref 8.9–10.3)
Chloride: 96 mmol/L — ABNORMAL LOW (ref 98–111)
Chloride: 97 mmol/L — ABNORMAL LOW (ref 98–111)
Creatinine, Ser: 7.69 mg/dL — ABNORMAL HIGH (ref 0.44–1.00)
Creatinine, Ser: 7.61 mg/dL — ABNORMAL HIGH (ref 0.44–1.00)
GFR calc Af Amer: 5 mL/min — ABNORMAL LOW (ref >60)
GFR calc Af Amer: 5 mL/min — ABNORMAL LOW (ref >60)
GFR calc non Af Amer: 5 mL/min — ABNORMAL LOW (ref >60)
GFR calc non Af Amer: 5 mL/min — ABNORMAL LOW (ref >60)
Glucose, Bld: 136 mg/dL — ABNORMAL HIGH (ref 70–99)
Glucose, Bld: 129 mg/dL — ABNORMAL HIGH (ref 70–99)
Potassium: 4 mmol/L (ref 3.5–5.1)
Potassium: 3.8 mmol/L (ref 3.5–5.1)
Sodium: 141 mmol/L (ref 135–145)
Sodium: 140 mmol/L (ref 135–145)
Total Bilirubin: 1.1 mg/dL (ref 0.3–1.2)
Total Bilirubin: 0.9 mg/dL (ref 0.3–1.2)
Total Protein: 6.2 g/dL — ABNORMAL LOW (ref 6.5–8.1)
Total Protein: 6.2 g/dL — ABNORMAL LOW (ref 6.5–8.1)

## 2019-04-20 LAB — CBC WITH DIFFERENTIAL/PLATELET
Abs Immature Granulocytes: 0.08 10*3/uL — ABNORMAL HIGH (ref 0.00–0.07)
Basophils Absolute: 0 10*3/uL (ref 0.0–0.1)
Basophils Relative: 0 %
Eosinophils Absolute: 0.1 10*3/uL (ref 0.0–0.5)
Eosinophils Relative: 1 %
HCT: 37.5 % (ref 36.0–46.0)
Hemoglobin: 11.1 g/dL — ABNORMAL LOW (ref 12.0–15.0)
Immature Granulocytes: 1 %
Lymphocytes Relative: 16 %
Lymphs Abs: 1.5 10*3/uL (ref 0.7–4.0)
MCH: 30.2 pg (ref 26.0–34.0)
MCHC: 29.6 g/dL — ABNORMAL LOW (ref 30.0–36.0)
MCV: 102.2 fL — ABNORMAL HIGH (ref 80.0–100.0)
Monocytes Absolute: 0.8 10*3/uL (ref 0.1–1.0)
Monocytes Relative: 9 %
Neutro Abs: 7.2 10*3/uL (ref 1.7–7.7)
Neutrophils Relative %: 73 %
Platelets: 191 10*3/uL (ref 150–400)
RBC: 3.67 MIL/uL — ABNORMAL LOW (ref 3.87–5.11)
RDW: 16.5 % — ABNORMAL HIGH (ref 11.5–15.5)
WBC: 9.7 10*3/uL (ref 4.0–10.5)
nRBC: 0 % (ref 0.0–0.2)

## 2019-04-20 LAB — HEMOGLOBIN A1C
Hgb A1c MFr Bld: 5.5 % (ref 4.8–5.6)
Mean Plasma Glucose: 111.15 mg/dL

## 2019-04-20 LAB — GLUCOSE, CAPILLARY
Glucose-Capillary: 52 mg/dL — ABNORMAL LOW (ref 70–99)
Glucose-Capillary: 122 mg/dL — ABNORMAL HIGH (ref 70–99)
Glucose-Capillary: 94 mg/dL (ref 70–99)

## 2019-04-20 LAB — LIPID PANEL
Cholesterol: 192 mg/dL (ref 0–200)
HDL: 53 mg/dL (ref >40)
LDL Cholesterol: 90 mg/dL (ref 0–99)
Total CHOL/HDL Ratio: 3.6 RATIO
Triglycerides: 244 mg/dL — ABNORMAL HIGH (ref <150)
VLDL: 49 mg/dL — ABNORMAL HIGH (ref 0–40)

## 2019-04-20 LAB — CBG MONITORING, ED
Glucose-Capillary: 103 mg/dL — ABNORMAL HIGH (ref 70–99)
Glucose-Capillary: 96 mg/dL (ref 70–99)

## 2019-04-20 MED ORDER — FLUOXETINE HCL 10 MG PO CAPS
10.0000 mg | ORAL_CAPSULE | Freq: Every day | ORAL | Status: DC
  Administered 2019-04-23 – 2019-04-26 (×4): 10 mg via ORAL
  Filled 2019-04-20 (×7): qty 1

## 2019-04-20 MED ORDER — ACETAMINOPHEN 650 MG RE SUPP: 650.0000 mg | RECTAL | Status: DC | PRN

## 2019-04-20 MED ORDER — SODIUM CHLORIDE 0.9% FLUSH
3.0000 mL | Freq: Two times a day (BID) | INTRAVENOUS | Status: DC
  Administered 2019-04-20 – 2019-04-26 (×10): 3 mL via INTRAVENOUS

## 2019-04-20 MED ORDER — LIDOCAINE-PRILOCAINE 2.5-2.5 % EX CREA: 1.0000 "application " | TOPICAL_CREAM | CUTANEOUS | Status: DC | PRN

## 2019-04-20 MED ORDER — ACETAMINOPHEN 650 MG RE SUPP: 650.0000 mg | Freq: Four times a day (QID) | RECTAL | Status: DC | PRN

## 2019-04-20 MED ORDER — IPRATROPIUM-ALBUTEROL 0.5-2.5 (3) MG/3ML IN SOLN: 3.0000 mL | RESPIRATORY_TRACT | Status: DC | PRN

## 2019-04-20 MED ORDER — DOXERCALCIFEROL 4 MCG/2ML IV SOLN
INTRAVENOUS | Status: AC
  Filled 2019-04-20: qty 2

## 2019-04-20 MED ORDER — ACETAMINOPHEN 325 MG PO TABS
650.0000 mg | ORAL_TABLET | Freq: Four times a day (QID) | ORAL | Status: DC | PRN
  Administered 2019-04-24: 650 mg via ORAL
  Filled 2019-04-20: qty 2

## 2019-04-20 MED ORDER — SODIUM CHLORIDE 0.9 % IV SOLN: 100.0000 mL | INTRAVENOUS | Status: DC | PRN

## 2019-04-20 MED ORDER — LEVOTHYROXINE SODIUM 112 MCG PO TABS
112.0000 ug | ORAL_TABLET | Freq: Every day | ORAL | Status: DC
  Administered 2019-04-22 – 2019-04-26 (×5): 112 ug via ORAL
  Filled 2019-04-20 (×5): qty 1

## 2019-04-20 MED ORDER — SODIUM CHLORIDE 0.9% FLUSH
3.0000 mL | Freq: Two times a day (BID) | INTRAVENOUS | Status: DC
  Administered 2019-04-20 – 2019-04-25 (×3): 3 mL via INTRAVENOUS

## 2019-04-20 MED ORDER — PENTAFLUOROPROP-TETRAFLUOROETH EX AERO: 1.0000 "application " | INHALATION_SPRAY | CUTANEOUS | Status: DC | PRN

## 2019-04-20 MED ORDER — ALTEPLASE 2 MG IJ SOLR: 2.0000 mg | Freq: Once | INTRAMUSCULAR | Status: DC | PRN

## 2019-04-20 MED ORDER — INSULIN ASPART 100 UNIT/ML ~~LOC~~ SOLN: 0.0000 [IU] | Freq: Three times a day (TID) | SUBCUTANEOUS | Status: DC

## 2019-04-20 MED ORDER — SODIUM CHLORIDE 0.9% FLUSH: 3.0000 mL | INTRAVENOUS | Status: DC | PRN

## 2019-04-20 MED ORDER — ONDANSETRON HCL 4 MG PO TABS: 4.0000 mg | ORAL_TABLET | Freq: Four times a day (QID) | ORAL | Status: DC | PRN

## 2019-04-20 MED ORDER — STROKE: EARLY STAGES OF RECOVERY BOOK
Freq: Once | Status: DC
  Filled 2019-04-20: qty 1

## 2019-04-20 MED ORDER — CALCITRIOL 0.5 MCG PO CAPS: 1.7500 ug | ORAL_CAPSULE | ORAL | Status: DC

## 2019-04-20 MED ORDER — HEPARIN SODIUM (PORCINE) 1000 UNIT/ML DIALYSIS
1000.0000 [IU] | INTRAMUSCULAR | Status: DC | PRN
  Administered 2019-04-22: 3800 [IU] via INTRAVENOUS_CENTRAL
  Filled 2019-04-20 (×2): qty 1

## 2019-04-20 MED ORDER — CHLORHEXIDINE GLUCONATE CLOTH 2 % EX PADS
6.0000 | MEDICATED_PAD | Freq: Every day | CUTANEOUS | Status: DC
  Administered 2019-04-21 – 2019-04-25 (×5): 6 via TOPICAL

## 2019-04-20 MED ORDER — ACETAMINOPHEN 325 MG PO TABS: 650.0000 mg | ORAL_TABLET | ORAL | Status: DC | PRN

## 2019-04-20 MED ORDER — HEPARIN SODIUM (PORCINE) 5000 UNIT/ML IJ SOLN
5000.0000 [IU] | Freq: Three times a day (TID) | INTRAMUSCULAR | Status: DC
  Administered 2019-04-20 – 2019-04-26 (×18): 5000 [IU] via SUBCUTANEOUS
  Filled 2019-04-20 (×18): qty 1

## 2019-04-20 MED ORDER — SENNOSIDES-DOCUSATE SODIUM 8.6-50 MG PO TABS: 1.0000 | ORAL_TABLET | Freq: Every evening | ORAL | Status: DC | PRN

## 2019-04-20 MED ORDER — FUROSEMIDE 20 MG PO TABS: 80.0000 mg | ORAL_TABLET | Freq: Every day | ORAL | Status: DC

## 2019-04-20 MED ORDER — DEXTROSE 50 % IV SOLN
1.0000 | Freq: Once | INTRAVENOUS | Status: AC
  Administered 2019-04-20: 50 mL via INTRAVENOUS

## 2019-04-20 MED ORDER — DEXTROSE 50 % IV SOLN
INTRAVENOUS | Status: AC
  Filled 2019-04-20: qty 50

## 2019-04-20 MED ORDER — SEVELAMER CARBONATE 800 MG PO TABS: 800.0000 mg | ORAL_TABLET | ORAL | Status: DC | PRN

## 2019-04-20 MED ORDER — SODIUM CHLORIDE 0.9 % IV SOLN: 250.0000 mL | INTRAVENOUS | Status: DC | PRN

## 2019-04-20 MED ORDER — LEVOTHYROXINE SODIUM 112 MCG PO TABS: 112.0000 ug | ORAL_TABLET | Freq: Every day | ORAL | Status: DC

## 2019-04-20 MED ORDER — SODIUM CHLORIDE 0.9 % IV SOLN: INTRAVENOUS | Status: DC

## 2019-04-20 MED ORDER — ROSUVASTATIN CALCIUM 5 MG PO TABS
10.0000 mg | ORAL_TABLET | Freq: Every day | ORAL | Status: DC
  Administered 2019-04-21: 10 mg via ORAL
  Filled 2019-04-20: qty 2

## 2019-04-20 MED ORDER — ACETAMINOPHEN 160 MG/5ML PO SOLN: 650.0000 mg | ORAL | Status: DC | PRN

## 2019-04-20 MED ORDER — FLUOXETINE HCL 20 MG PO TABS
10.0000 mg | ORAL_TABLET | Freq: Every day | ORAL | Status: DC
  Filled 2019-04-20 (×2): qty 1

## 2019-04-20 MED ORDER — DOXERCALCIFEROL 4 MCG/2ML IV SOLN
3.0000 ug | INTRAVENOUS | Status: DC
  Administered 2019-04-20: 3 ug via INTRAVENOUS
  Filled 2019-04-20 (×2): qty 2

## 2019-04-20 MED ORDER — MIDODRINE HCL 5 MG PO TABS
10.0000 mg | ORAL_TABLET | ORAL | Status: DC
  Administered 2019-04-22 – 2019-04-25 (×3): 10 mg via ORAL
  Filled 2019-04-20 (×2): qty 2

## 2019-04-20 MED ORDER — ONDANSETRON HCL 4 MG/2ML IJ SOLN: 4.0000 mg | Freq: Four times a day (QID) | INTRAMUSCULAR | Status: DC | PRN

## 2019-04-20 MED ORDER — LIDOCAINE HCL (PF) 1 % IJ SOLN: 5.0000 mL | INTRAMUSCULAR | Status: DC | PRN

## 2019-04-20 NOTE — ED Notes (Signed)
No Urine resulted from the In and Out Catheterization; EDP made aware and pt was placed on a external female urinary catheter.

## 2019-04-20 NOTE — Procedures (Signed)
Patient seen on Hemodialysis. BP (P) 120/82   Pulse (P) 91   Temp 98 F (36.7 C) (Oral)   Resp (!) 29   Wt 87.1 kg   SpO2 (P) 97%   BMI 36.28 kg/m   QB 300, UF goal 2L---------current BP 77/51 Tolerating treatment without complaints at this time. Disoriented to place, vaguely oriented to time.   Elmarie Shiley MD [facility]. Office # [phone] Pager # [phone] 3:22 PM

## 2019-04-20 NOTE — ED Notes (Signed)
Tele   bfast ordered  

## 2019-04-20 NOTE — Consult Note (Addendum)
Neurology Consultation  Reason for Consult: Stroke Referring Physician: Geradine Girt, DO  CC: Stroke  History is obtained from: Chart  HPI: Joan Mosley is a 74 y.o. female with history of TIA, renal disorder, oxygen dependency, diabetic.  While in hospital, neurology was asked to see patient secondary to punctate infarct noted on MRI.  Patient presented to the ED with increased confusion and headache.  Patient apparently had been seen a few days ago for confusion and choking episode but was discharged home at that time.  While in the ED family stated that she is essentially bedbound at home, they do not feel that they can adequately care for her.  While in the ED patient was saturating at 100% on 4 L/min of supplemental oxygen, heart rate was 104, respirations were 22 and systolic blood pressure was 140.  EKG did show sinus tachycardia at 100 bpm.  Due to patient's acute encephalopathy patient was sent for CT head and MRI.  Currently patient is getting hemodialysis, she is able to follow commands, she is confused to where she is and cannot give a good history.  LKW: Unknown tpa given?: no, out of window Premorbid modified Rankin scale (mRS): 5 ICH Score: 3-left facial droop, and unable to answer to questions   Past Medical History:  Diagnosis Date  . Arthritis   . COPD (chronic obstructive pulmonary disease) ([place])   . Diabetes mellitus without complication ([place])   . Diabetic neuropathy ([place])   . 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 Renal disorder    ESRD  . Thyroid disease   . TIA (transient ischemic attack)     Family History  Problem Relation Age of Onset  . Heart disease Mother   . Hypertension Mother   . Heart disease Father   . Hypertension Father    Social History:   reports that she quit smoking about 38 years ago. She has a 35.00 pack-year smoking history. She has never used smokeless tobacco. She reports that she does not drink alcohol or use  drugs.  Medications  Current Facility-Administered Medications:  .   stroke: mapping our early stages of recovery book, , Does not apply, Once, Black, Karen M, NP .  0.9 %  sodium chloride infusion, 250 mL, Intravenous, PRN, Opyd, Ilene Qua, MD .  0.9 %  sodium chloride infusion, , Intravenous, Continuous, Black, Karen M, NP .  acetaminophen (TYLENOL) tablet 650 mg, 650 mg, Oral, Q6H PRN **OR** acetaminophen (TYLENOL) suppository 650 mg, 650 mg, Rectal, Q6H PRN, Opyd, Ilene Qua, MD .  Chlorhexidine Gluconate Cloth 2 % PADS 6 each, 6 each, Topical, Q0600, Alric Seton, PA-C .  doxercalciferol (HECTOROL) injection 3 mcg, 3 mcg, Intravenous, Q T,Th,Sa-HD, Alric Seton, PA-C .  FLUoxetine (PROZAC) capsule 10 mg, 10 mg, Oral, Daily, Vann, Jessica U, DO, Stopped at 04/20/19 1006 .  furosemide (LASIX) tablet 80 mg, 80 mg, Oral, Daily, Black, Lezlie Octave, NP, Stopped at 04/20/19 1005 .  heparin injection 5,000 Units, 5,000 Units, Subcutaneous, Q8H, Opyd, Ilene Qua, MD, 5,000 Units at 04/20/19 0555 .  insulin aspart (novoLOG) injection 0-6 Units, 0-6 Units, Subcutaneous, TID WC, Opyd, Timothy S, MD .  ipratropium-albuterol (DUONEB) 0.5-2.5 (3) MG/3ML nebulizer solution 3 mL, 3 mL, Nebulization, Q4H PRN, Opyd, Timothy S, MD .  levothyroxine (SYNTHROID) tablet 112 mcg, 112 mcg, Oral, QAC breakfast, Opyd, Timothy S, MD .  midodrine (PROAMATINE) tablet 10 mg, 10 mg, Oral, See admin instructions, Opyd, Ilene Qua, MD .  ondansetron (ZOFRAN) tablet 4 mg, 4 mg, Oral, Q6H PRN **OR** ondansetron (ZOFRAN) injection 4 mg, 4 mg, Intravenous, Q6H PRN, Opyd, Ilene Qua, MD .  rosuvastatin (CRESTOR) tablet 10 mg, 10 mg, Oral, q1800, Opyd, Ilene Qua, MD .  senna-docusate (Senokot-S) tablet 1 tablet, 1 tablet, Oral, QHS PRN, Black, Karen M, NP .  sevelamer carbonate (RENVELA) tablet 800 mg, 800 mg, Oral, PRN, Opyd, Timothy S, MD .  sodium chloride flush (NS) 0.9 % injection 3 mL, 3 mL, Intravenous, Q12H, Opyd, Ilene Qua, MD, 3 mL at 04/20/19 0943 .  sodium chloride flush (NS) 0.9 % injection 3 mL, 3 mL, Intravenous, Q12H, Opyd, Ilene Qua, MD, 3 mL at 04/20/19 0943 .  sodium chloride flush (NS) 0.9 % injection 3 mL, 3 mL, Intravenous, PRN, Opyd, Ilene Qua, MD  Current Outpatient Medications:  .  acetaminophen (TYLENOL) 500 MG tablet, Take 1,000 mg by mouth every 6 (six) hours as needed (for arthritic pain). , Disp: , Rfl:  .  calcitRIOL (ROCALTROL) 0.25 MCG capsule, Take 7 capsules (1.75 mcg total) by mouth Every Tuesday,Thursday,and Saturday with dialysis., Disp:  , Rfl:  .  clonazePAM (KLONOPIN) 0.5 MG tablet, TAKE 1/2 (ONE-HALF) TABLET BY MOUTH IN THE MORNING AND 1 IN THE EVENING, Disp: 45 tablet, Rfl: 0 .  FLUoxetine (PROZAC) 10 MG tablet, Take 1 tablet (10 mg total) by mouth daily., Disp: 90 tablet, Rfl: 3 .  food thickener (THICK IT) POWD, Take by mouth as needed (drinks)., Disp: , Rfl:  .  furosemide (LASIX) 80 MG tablet, Take 80 mg by mouth., Disp: , Rfl:  .  guaiFENesin (MUCINEX) 600 MG 12 hr tablet, Take 1 tablet (600 mg total) by mouth 2 (two) times daily., Disp:  , Rfl:  .  insulin detemir (LEVEMIR) 100 UNIT/ML injection, Inject 15 Units into the skin every morning., Disp: , Rfl:  .  insulin lispro (HUMALOG) 100 UNIT/ML injection, Inject into the skin 3 (three) times daily before meals. Per sliding scale, Disp: , Rfl:  .  ipratropium-albuterol (DUONEB) 0.5-2.5 (3) MG/3ML SOLN, Take 3 mLs by nebulization See admin instructions. Nebulize and inhale 3 ml's into the lungs every four hours, while awake, Disp: , Rfl:  .  levalbuterol (XOPENEX HFA) 45 MCG/ACT inhaler, Inhale 1-2 puffs into the lungs every 4 (four) hours as needed for wheezing., Disp: , Rfl:  .  levothyroxine (SYNTHROID) 112 MCG tablet, Take 1 tablet (112 mcg total) by mouth daily before breakfast. 1 AM, Disp: 90 tablet, Rfl: 1 .  lidocaine (LIDODERM) 5 %, Place 1 patch onto the skin daily as needed (pain). Remove & Discard patch within 12  hours or as directed by MD, Disp: , Rfl:  .  midodrine (PROAMATINE) 10 MG tablet, Take 10 mg by mouth See admin instructions. Take 10 mg by mouth on Tuesday, Thursday, Saturday 45 minutes before dialysis, Disp: , Rfl:  .  montelukast (SINGULAIR) 10 MG tablet, Take 10 mg by mouth at bedtime. , Disp: , Rfl:  .  nystatin (MYCOSTATIN/NYSTOP) powder, Apply 1 g topically 2 (two) times daily as needed (as directed- to any rashes). , Disp: , Rfl:  .  nystatin cream (MYCOSTATIN), Apply 1 application topically 2 (two) times daily as needed for dry skin. , Disp: , Rfl:  .  rosuvastatin (CRESTOR) 20 MG tablet, Take 1 tablet (20 mg total) by mouth at bedtime., Disp: 90 tablet, Rfl: 3 .  ascorbic acid (VITAMIN C) 500 MG tablet, Take 1 tablet (500 mg total)  by mouth daily., Disp:  , Rfl:  .  benzonatate (TESSALON) 200 MG capsule, Take 1 capsule (200 mg total) by mouth 2 (two) times daily as needed for cough., Disp: 30 capsule, Rfl: 0 .  Darbepoetin Alfa (ARANESP) 25 MCG/0.42ML SOSY injection, Inject 0.42 mLs (25 mcg total) into the vein every Tuesday with hemodialysis., Disp: 14.28 mL, Rfl:  .  docusate sodium (COLACE) 100 MG capsule, Take 100 mg by mouth every morning., Disp: , Rfl:  .  Lubricants (K-Y LUBRICANT JELLY SENSITIVE EX), Place 1 application into both nostrils as needed (as directed- for lubrication). , Disp: , Rfl:  .  multivitamin (RENA-VIT) TABS tablet, Take 1 tablet by mouth at bedtime., Disp: , Rfl: 0 .  NASAL SPRAY SALINE NA, Place 1-2 sprays into both nostrils as needed (for congestion- "[employer] Simply Saline" brand)., Disp: , Rfl:  .  Nutritional Supplements (FEEDING SUPPLEMENT, NEPRO CARB STEADY,) LIQD, Take 237 mLs by mouth 2 (two) times daily between meals., Disp: , Rfl: 0 .  OVER THE COUNTER MEDICATION, Stool softner otc, Disp: , Rfl:  .  oxymetazoline (AFRIN) 0.05 % nasal spray, Place 1 spray into both nostrils 2 (two) times daily as needed for congestion., Disp: , Rfl:  .  senna  (SENOKOT) 8.6 MG TABS tablet, Take 1 tablet by mouth every morning., Disp: , Rfl:  .  sevelamer carbonate (RENVELA) 800 MG tablet, Take 1 tablet (800 mg total) by mouth as needed (with snacks)., Disp:  , Rfl:  .  VENTOLIN HFA 108 (90 Base) MCG/ACT inhaler, Inhale 2 puffs into the lungs every 6 (six) hours as needed for wheezing or shortness of breath., Disp: 18 g, Rfl: 2  ROS:   Unable to obtain due to altered mental status.    Exam: Current vital signs: BP (P) 120/82   Pulse (P) 91   Temp 98 F (36.7 C) (Oral)   Resp (!) 29   Wt 87.1 kg   SpO2 (P) 97%   BMI 36.28 kg/m  Vital signs in last 24 hours: Temp:  [98 F (36.7 C)-98.3 F (36.8 C)] 98 F (36.7 C) (03/25 1250) Pulse Rate:  [87-104] (P) 91 (03/25 1256) Resp:  [15-29] 29 (03/25 1250) BP: (95-144)/(64-96) (P) 120/82 (03/25 1256) SpO2:  [87 %-100 %] (P) 97 % (03/25 1250) Weight:  [87.1 kg] 87.1 kg (03/25 1250)   Constitutional: Appears well-developed and well-nourished.  Eyes: No scleral injection HENT: No OP obstrucion Head: Normocephalic.  Cardiovascular: Sinus tachycardia Respiratory: Effort normal, non-labored breathing GI: Soft.  No distension. There is no tenderness.  Skin: WDI Neuro: Mental Status: Patient is awake, alert, oriented to hospital but when asked what she is getting, she cannot tell me that she is getting dialysis, she does not know why she is in the hospital, she cannot tell the month, she cannot tell the year Speech-clear, intact naming, repeating, and comprehension Cranial Nerves: II: Visual Fields are full.  III,IV, VI: EOMI without ptosis or diploplia. Pupils equal, round and reactive to light V: Facial sensation is symmetric to temperature VII: Left facial droop however she is adentulous on the left aspect of her mouth VIII: Significantly decreased X: Palat elevates symmetrically XI: Shoulder shrug is symmetric. XII: tongue is midline without atrophy or fasciculations.  Motor: Tone is  normal. Bulk is normal.  Patient has 4/5 strength in upper extremities and lower extremities.  Patient has both resting and postural tremor.  I tested for asterixis and did not note any.  She has no drift.  Patient does have a externally rotated right leg but does acknowledge that she does have hip problems in that region. Sensory: Sensation was difficult to obtain secondary to patient being hard of hearing and also very confused.  She did withdraw from noxious stimuli in all 4 extremities. Deep Tendon Reflexes: 1+ bilateral upper extremities with no knee jerk or ankle jerk Plantars: Mute bilaterally Cerebellar: FNF intact  Labs I have reviewed labs in epic and the results pertinent to this consultation are:  CBC    Component Value Date/Time   WBC 9.7 04/20/2019 0558   RBC 3.67 (L) 04/20/2019 0558   HGB 11.1 (L) 04/20/2019 0558   HCT 37.5 04/20/2019 0558   PLT 191 04/20/2019 0558   MCV 102.2 (H) 04/20/2019 0558   MCH 30.2 04/20/2019 0558   MCHC 29.6 (L) 04/20/2019 0558   RDW 16.5 (H) 04/20/2019 0558   LYMPHSABS 1.5 04/20/2019 0558   MONOABS 0.8 04/20/2019 0558   EOSABS 0.1 04/20/2019 0558   BASOSABS 0.0 04/20/2019 0558    CMP     Component Value Date/Time   NA 140 04/20/2019 0558   K 3.8 04/20/2019 0558   CL 97 (L) 04/20/2019 0558   CO2 28 04/20/2019 0558   GLUCOSE 129 (H) 04/20/2019 0558   BUN 38 (H) 04/20/2019 0558   CREATININE 7.61 (H) 04/20/2019 0558   CREATININE 7.20 (H) 07/01/2018 1149   CALCIUM 9.6 04/20/2019 0558   CALCIUM 9.7 02/24/2019 1008   PROT 6.2 (L) 04/20/2019 0558   ALBUMIN 2.6 (L) 04/20/2019 0558   AST 48 (H) 04/20/2019 0558   ALT 15 04/20/2019 0558   ALKPHOS 108 04/20/2019 0558   BILITOT 0.9 04/20/2019 0558   GFRNONAA 5 (L) 04/20/2019 0558   GFRAA 5 (L) 04/20/2019 0558    Lipid Panel     Component Value Date/Time   CHOL 164 07/01/2018 1149   TRIG 202 (H) 07/01/2018 1149   HDL 78 07/01/2018 1149   CHOLHDL 2.1 07/01/2018 1149   LDLCALC 59  07/01/2018 1149     Imaging I have reviewed the images obtained:  CT-scan of the brain-stable exam, no acute process  MRI examination of the brain-small acute to subacute right temporal infarct.  Moderate to advanced chronic small microvascular ischemic changes.  Dural thickening versus minimal residual chronic subdural collection.   Etta Quill PA-C [facility] [phone]  M-F  (9:00 am- 5:00 PM)  04/20/2019, 2:07 PM     Assessment:  This 74 year old female initially brought in secondary to headache and confusion.  Currently on exam patient is very confused, has a postural and resting tremor, is able to follow commands. Due to her confusion patient obtained a MRI brain which did show a punctate infarct in the right temporal lobe which likely asymptomatic. That said, given new infarct in the setting of stroke risk factors agree with obtaining stroke work-up.  Although the stroke is very small and likely not responsible for any symptoms, given her risk factors a full work-up should be done to optimize risk factors.  Not a candidate for TPA due to unclear last last well-known history Not a candidate for EVT due to poor modified Rankin as well as punctate stroke likely small vessel etiology.  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  Recommendations: -Ammonia level -TSH level -MRA head and neck -Echocardiogram -Hemoglobin A1c and LDL -Echocardiogram -81 mg aspirin daily -Statin -Frequent neurochecks -PT OT speech therapy -N.p.o. until cleared by  stroke swallow screen or formal swallow evaluation. -No need for permissive hypertension.  Can start normalizing blood pressures.  Attending Neurohospitalist Addendum Patient seen and examined with APP/Resident. Agree with the history and physical as documented above. Agree with the plan as documented, which I helped formulate. I have independently reviewed the chart, obtained  history, review of systems and examined the patient.I have personally reviewed pertinent head/neck/spine imaging (CT/MRI). Please feel free to call with any questions. --- Amie Portland, MD [facility] Pager: [phone]  If 7pm to 7am, please call on call as listed on AMION.

## 2019-04-20 NOTE — Progress Notes (Addendum)
Progress Note    Joan Mosley  KXF:[id number] DOB: [birth year]  DOA: 04/19/2019 PCP: Flossie Buffy, NP    Brief Narrative:   Chief complaint: acute encephalopathy  Medical records reviewed and are as summarized below:  Joan Mosley is an 74 y.o. female past medical history that includes COPD with chronic hypoxic respiratory failure, end-stage renal disease on dialysis Tuesday Thursday Saturday, hypothyroidism, insulin-dependent diabetes, chronic diastolic CHF, history of CVA, recent admission with acute on chronic respiratory failure secondary to Covid presented to the emergency department March 24 chief complaint increased confusion and headache.  Initial work-up in the emergency department included an MRI without contrast of the brain revealing an acute or subacute right temporal infarct  Assessment/Plan:   Principal Problem:   Stroke [facility]) Active Problems:   ESRD (end stage renal disease) ([place])   Acute encephalopathy   Type 2 diabetes mellitus with diabetic polyneuropathy, with long-term current use of insulin (HCC)   Chronic respiratory failure with hypoxia, on home O2 therapy (HCC)   COPD (chronic obstructive pulmonary disease) (HCC)   Hypothyroidism   Depression with anxiety  #1.  Stroke.  Patient presented to the hospital with chief complaint of increased confusion and headache.  Risk factors include DM, HTN, CAD. Family reports some right-sided weakness or "favoring".  CT of the head was stable and no acute process.  No metabolic derangements noted.  No infectious process.  Family did note that she has not participated in dialysis since March 20. -Carotid Dopplers -Echo -Hemoglobin A1c and lipid panel -MRA of the neck -Bedside swallow eval -Keep n.p.o. until she passes -PT/OT/ST -Aspirin -Home medications include Crestor will continue -Hold Lasix for now -Neuro consult  #2.  End-stage renal disease.  Patient is a Tuesday Thursday Saturday dialysis  schedule.  Last dialyzed on March 20.  creatinine 7.6 -Has been evaluated by nephrology and is for dialysis today -Appreciate nephrology assistance  #3.  Chronic diastolic heart failure.  Appears compensated.  Home medications include Lasix. -Her blood pressure is on the low end of normal so we will hold her Lasix for now -Dialysis as noted above -Daily weights -Intake and output -Continue other home meds  #4.  Insulin-dependent diabetes.  Hemoglobin A1c 5.4 last month.  Serum glucose 136 on admission.  Home medications include Levemir. -Continue to hold Levemir for now -Sliding scale insulin for optimal control -resume Levemir as indicated  #5.  COPD with chronic respiratory failure.  Patient states she does not wear oxygen at home.  Chest x-ray with unchanged mildly increased interstitial markings throughout both lungs which could be due to chronic lung changes or mild interstitial edema and trace right pleural effusion.  Oxygen saturation level greater than 90% on nasal cannula -Continue oxygen supplementation -Monitor oxygen saturation level  #6.  Hypothyroidism.  Chart review indicates elevated TSH with normal T4 earlier this month.  Medications include Synthroid -Continue Synthroid  #7.  History of CVA.  Home medications include a statin.  MRI as noted above -Continue Crestor   Family Communication/Anticipated D/C date and plan/Code Status   DVT prophylaxis: Lovenox ordered. Code Status: Full Code.  Family Communication: son Joan Mosley who is healthcare POA and lives in [place], she has local children who she lives with but they cannot care for her anymore Disposition Plan: likely snf   Medical Consultants:   neurology     Subjective:   Lying in bed with eyes closed.  Will open eyes to verbal stimuli.  Attempts to  follow commands and answer questions.  Quite lethargic denies pain or discomfort  Objective:    Vitals:   04/20/19 1200 04/20/19 1215 04/20/19 1226 04/20/19  1250  BP: 133/82 114/78  129/79  Pulse: 88 90 90 97  Resp: (!) 24 16 19  (!) 29  Temp:    98 F (36.7 C)  TempSrc:    Oral  SpO2: 96% 96% 98% (P) 97%  Weight:    87.1 kg   No intake or output data in the 24 hours ending 04/20/19 1302 Filed Weights   04/20/19 1250  Weight: 87.1 kg    Exam: General: Awake slightly lethargic obese no acute distress CV: Regular rate and rhythm trace lower extremity edema pedal pulses present palpable I hear no murmur gallop or rub Respiratory: Mild increased work of breathing breath sounds are distant.  I hear no crackles Abdomen: Obese soft positive bowel sounds throughout no guarding or rebounding Musculoskeletal: Joints without swelling/erythema Neuro awake slightly lethargic opens eyes to verbal stimuli attempts to follow commands mostly non-verbal   Data Reviewed:   I have personally reviewed following labs and imaging studies:  Labs: Labs show the following:   Basic Metabolic Panel: Recent Labs  Lab 04/15/19 2250 04/15/19 2250 04/19/19 2226 04/20/19 0558  NA 137  --  141 140  K 4.1   < > 4.0 3.8  CL 94*  --  96* 97*  CO2 27  --  27 28  GLUCOSE 249*  --  136* 129*  BUN 10  --  37* 38*  CREATININE 2.39*  --  7.69* 7.61*  CALCIUM 9.2  --  9.6 9.6   < > = values in this interval not displayed.   GFR Estimated Creatinine Clearance: 6.4 mL/min (A) (by C-G formula based on SCr of 7.61 mg/dL (H)). Liver Function Tests: Recent Labs  Lab 04/15/19 2250 04/19/19 2226 04/20/19 0558  AST 21 47* 48*  ALT 16 15 15   ALKPHOS 110 107 108  BILITOT 1.0 1.1 0.9  PROT 6.5 6.2* 6.2*  ALBUMIN 2.8* 2.7* 2.6*   No results for input(s): LIPASE, AMYLASE in the last 168 hours. No results for input(s): AMMONIA in the last 168 hours. Coagulation profile Recent Labs  Lab 04/15/19 2250  INR 1.0    CBC: Recent Labs  Lab 04/15/19 2250 04/19/19 2226 04/20/19 0558  WBC 14.9* 9.8 9.7  NEUTROABS 12.5* 7.1 7.2  HGB 11.0* 11.0* 11.1*  HCT 37.5  37.4 37.5  MCV 103.3* 101.4* 102.2*  PLT 177 181 191   Cardiac Enzymes: No results for input(s): CKTOTAL, CKMB, CKMBINDEX, TROPONINI in the last 168 hours. BNP (last 3 results) Recent Labs    04/10/19 1418  PROBNP 81.0   CBG: Recent Labs  Lab 04/15/19 2321 04/19/19 2256 04/20/19 0815 04/20/19 1208  GLUCAP 232* 132* 103* 96   D-Dimer: No results for input(s): DDIMER in the last 72 hours. Hgb A1c: No results for input(s): HGBA1C in the last 72 hours. Lipid Profile: No results for input(s): CHOL, HDL, LDLCALC, TRIG, CHOLHDL, LDLDIRECT in the last 72 hours. Thyroid function studies: No results for input(s): TSH, T4TOTAL, T3FREE, THYROIDAB in the last 72 hours.  Invalid input(s): FREET3 Anemia work up: No results for input(s): VITAMINB12, FOLATE, FERRITIN, TIBC, IRON, RETICCTPCT in the last 72 hours. Sepsis Labs: Recent Labs  Lab 04/15/19 2250 04/19/19 2226 04/20/19 0558  WBC 14.9* 9.8 9.7    Microbiology Recent Results (from the past 240 hour(s))  SARS CORONAVIRUS 2 (TAT 6-24  HRS) Nasopharyngeal Nasopharyngeal Swab     Status: None   Collection Time: 04/15/19 10:55 PM   Specimen: Nasopharyngeal Swab  Result Value Ref Range Status   SARS Coronavirus 2 NEGATIVE NEGATIVE Final    Comment: (NOTE) SARS-CoV-2 target nucleic acids are NOT DETECTED. The SARS-CoV-2 RNA is generally detectable in upper and lower respiratory specimens during the acute phase of infection. Negative results do not preclude SARS-CoV-2 infection, do not rule out co-infections with other pathogens, and should not be used as the sole basis for treatment or other patient management decisions. Negative results must be combined with clinical observations, patient history, and epidemiological information. The expected result is Negative. Fact Sheet for Patients: SugarRoll.be Fact Sheet for Healthcare Providers: https://www.woods-mathews.com/ This test is  not yet approved or cleared by the [place] FDA and  has been authorized for detection and/or diagnosis of SARS-CoV-2 by FDA under an Emergency Use Authorization (EUA). This EUA will remain  in effect (meaning this test can be used) for the duration of the COVID-19 declaration under Section 56 4(b)(1) of the Act, 21 U.S.C. section 360bbb-3(b)(1), unless the authorization is terminated or revoked sooner. Performed at [facility], [place] [address], [place], [place] [address]     Procedures and diagnostic studies:  CT Head Wo Contrast  Result Date: 04/19/2019 CLINICAL DATA:  Encephalopathy, confusion, diabetes EXAM: CT HEAD WITHOUT CONTRAST TECHNIQUE: Contiguous axial images were obtained from the base of the skull through the vertex without intravenous contrast. COMPARISON:  04/15/2019 FINDINGS: Brain: Chronic small-vessel ischemic changes throughout the periventricular white matter and central pons. No acute infarct or hemorrhage. Lateral ventricles and midline structures are unremarkable. Stable diffuse cerebral atrophy. No acute extra-axial fluid collections. Stable bilateral subdural hygromas along the convexities. No mass effect. Vascular: No hyperdense vessel or unexpected calcification. Skull: Normal. Negative for fracture or focal lesion. Sinuses/Orbits: No acute finding. Other: None IMPRESSION: 1. Stable exam, no acute process. Electronically Signed   By: Randa Ngo Mosley.D.   On: 04/19/2019 23:25   MR BRAIN WO CONTRAST  Result Date: 04/20/2019 CLINICAL DATA:  Encephalopathy EXAM: MRI HEAD WITHOUT CONTRAST TECHNIQUE: Multiplanar, multiecho pulse sequences of the brain and surrounding structures were obtained without intravenous contrast. COMPARISON:  None. FINDINGS: Brain: There a small focus mildly reduced diffusion in the right temporal lobe. No hemorrhage. There is no intracranial mass, mass effect, or edema. Patchy and confluent areas of T2 hyperintensity in the  supratentorial and pontine white matter are nonspecific but probably reflect moderate to advanced chronic microvascular ischemic changes. There is bilateral cerebral convexity dural thickening or minimal residual chronic subdural collections. Prominence of the ventricles and sulci reflects mild generalized parenchymal volume loss. There is no hydrocephalus or extra-axial fluid collection. Vascular: Major vessel flow voids at the skull base are preserved. Skull and upper cervical spine: Normal marrow signal is preserved. Sinuses/Orbits: Paranasal sinuses are aerated. Orbits are unremarkable. Other: Sella is unremarkable.  Nonspecific mastoid effusions. IMPRESSION: Small acute to subacute right temporal infarct. Moderate to advanced chronic microvascular ischemic changes. Dural thickening versus minimal residual chronic subdural collections. Nonspecific mastoid effusions. Electronically Signed   By: Macy Mis Mosley.D.   On: 04/20/2019 10:06   DG Chest Portable 1 View  Result Date: 04/19/2019 CLINICAL DATA:  Altered mental status EXAM: PORTABLE CHEST 1 VIEW COMPARISON:  April 15, 2019 FINDINGS: There is unchanged cardiomegaly, the patient is slightly rotated. Aortic calcifications are seen. A right-sided central venous catheter with the tip just entering the right  atrium. Again noted are mildly increased interstitial markings throughout both lungs, not significantly changed since the prior exam. There is also stable right basilar small pleural effusion. No acute osseous abnormality. IMPRESSION: Unchanged mildly increased interstitial markings throughout both lungs which could be due to chronic lung changes or mild interstitial edema. Trace right pleural effusion. Electronically Signed   By: Prudencio Pair Mosley.D.   On: 04/19/2019 22:48    Medications:     stroke: mapping our early stages of recovery book   Does not apply Once   Chlorhexidine Gluconate Cloth  6 each Topical Q0600   doxercalciferol  3 mcg  Intravenous Q T,Th,Sa-HD   FLUoxetine  10 mg Oral Daily   furosemide  80 mg Oral Daily   heparin  5,000 Units Subcutaneous Q8H   insulin aspart  0-6 Units Subcutaneous TID WC   levothyroxine  112 mcg Oral QAC breakfast   midodrine  10 mg Oral See admin instructions   rosuvastatin  10 mg Oral q1800   sodium chloride flush  3 mL Intravenous Q12H   sodium chloride flush  3 mL Intravenous Q12H   Continuous Infusions:  sodium chloride     sodium chloride       LOS: 0 days   Kirby Argueta Mosley  [facility]   How to contact the [facility] Attending or Consulting provider [place] or covering provider during after hours [place], for this patient?  Check the care team in [facility] and look for a) attending/consulting [facility] provider listed and b) the [facility] team listed Log into [email or web address] and use [place] universal password to access. If you do not have the password, please contact the hospital operator. Locate the [facility] provider you are looking for under [facility] and page to a number that you can be directly reached. If you still have difficulty reaching the provider, please page the [facility] (Director on Call) for the Hospitalists listed on amion for assistance.  04/20/2019, 1:02 PM

## 2019-04-20 NOTE — ED Notes (Signed)
Pt found asleep, opens eyes to voice but is not speaking, not following commands. RN completed neuro and modified NIHSS, admitting paged for neuro change.

## 2019-04-20 NOTE — ED Notes (Signed)
RN went to check CBG, pt was more awake, RN performed another neuro assessment and modified NIHSS. Pt oriented to self and place only, able to follow commands.

## 2019-04-20 NOTE — H&P (Signed)
History and Physical    Joan Mosley GQQ:[id number] DOB: [birth year] DOA: 04/19/2019  PCP: Flossie Buffy, NP   Patient coming from: Home   Chief Complaint: Increased confusion, headache   HPI: Joan Mosley is a 74 y.o. female with medical history significant for COPD with chronic hypoxic respiratory failure, ESRD, hypothyroidism, insulin-dependent diabetes mellitus, chronic diastolic CHF, history of CVA, and admission in January with acute respiratory disease secondary to COVID-19, now presenting to the emergency department with increased confusion and headache.  Patient had been seen in the ED a few days ago with confusion and a choking episode, but was stable for discharge home at that time.  Family report that she is essentially bedbound at home, they do not feel that they can adequately care for her, and reports that she became more confused than usual yesterday afternoon, later complained of a headache, and seemed to be favoring her right side more than usual.  Patient herself is unable to provide any history in the emergency department though she wakes to voice and holds eye contact briefly before returning to sleep.  ED Course: Upon arrival to the ED, patient is found to be afebrile, saturating 100% on 4 L/min of supplemental oxygen, heart rate 104, respirations 22, and systolic blood pressure 671.  EKG with sinus rhythm, rate 100, and PVCs.  Noncontrast head CT negative for acute intracranial abnormality.  Chest x-ray with unchanged mild interstitial markings throughout both lungs which could reflect chronic interstitial changes or mild interstitial edema.  Chemistry panel with normal potassium, normal bicarbonate, BUN 37, albumin 2.7, and AST 47.  CBC with mild macrocytosis.  Urinalysis was ordered but not yet collected.  Hospitalist consulted for admission.  Review of Systems:  Unable to complete ROS secondary to patient's clinical condition.  Past Medical History:  Diagnosis  Date  . Arthritis   . COPD (chronic obstructive pulmonary disease) ([place])   . Diabetes mellitus without complication ([place])   . Diabetic neuropathy ([place])   . 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 Renal disorder    ESRD  . Thyroid disease   . TIA (transient ischemic attack)     Past Surgical History:  Procedure Laterality Date  . ABDOMINAL HYSTERECTOMY    . IR FLUORO GUIDE CV LINE RIGHT  11/14/2018  . RECTOCELE REPAIR    . REPLACEMENT TOTAL KNEE BILATERAL Bilateral 2008  . THYROIDECTOMY     80%  . VAGINAL WOUND CLOSURE / REPAIR       reports that she quit smoking about 38 years ago. She has a 35.00 pack-year smoking history. She has never used smokeless tobacco. She reports that she does not drink alcohol or use drugs.  Allergies  Allergen Reactions  . Avelox [Moxifloxacin Hcl In Nacl] Other (See Comments)    Unknown reaction  . Codeine Anaphylaxis  . Penicillins Rash    Did it involve swelling of the face/tongue/throat, SOB, or low BP? Unk Did it involve sudden or severe rash/hives, skin peeling, or any reaction on the inside of your mouth or nose? Yes Did you need to seek medical attention at a hospital or doctor's office? Unk When did it last happen? Unk If all above answers are "NO", may proceed with cephalosporin use.   . Sulfa Antibiotics Other (See Comments)    Unknown reaction  . Dextromethorphan-Guaifenesin Other (See Comments)    Reported by [facility] - unknown reaction. Pt states she is not allergic to dextromethorphan-guaifenesin.  Marland Kitchen Shellfish Allergy  Hives, Itching and Rash    Family History  Problem Relation Age of Onset  . Heart disease Mother   . Hypertension Mother   . Heart disease Father   . Hypertension Father      Prior to Admission medications   Medication Sig Start Date End Date Taking? Authorizing Provider  acetaminophen (TYLENOL) 500 MG tablet Take 1,000 mg by mouth every 6 (six) hours as needed (for arthritic pain).    Yes [provider]  calcitRIOL (ROCALTROL) 0.25 MCG capsule Take 7 capsules (1.75 mcg total) by mouth Every Tuesday,Thursday,and Saturday with dialysis. 11/22/18  Yes Hosie Poisson, MD  clonazePAM (KLONOPIN) 0.5 MG tablet TAKE 1/2 (ONE-HALF) TABLET BY MOUTH IN THE MORNING AND 1 IN THE EVENING 04/18/19  Yes Nche, Charlene Brooke, NP  FLUoxetine (PROZAC) 10 MG tablet Take 1 tablet (10 mg total) by mouth daily. 02/08/19  Yes Nche, Charlene Brooke, NP  food thickener (THICK IT) POWD Take by mouth as needed (drinks).   Yes [provider]  furosemide (LASIX) 80 MG tablet Take 80 mg by mouth.   Yes [provider]  guaiFENesin (MUCINEX) 600 MG 12 hr tablet Take 1 tablet (600 mg total) by mouth 2 (two) times daily. 02/22/19  Yes Hosie Poisson, MD  insulin detemir (LEVEMIR) 100 UNIT/ML injection Inject 15 Units into the skin every morning.   Yes [provider]  insulin lispro (HUMALOG) 100 UNIT/ML injection Inject into the skin 3 (three) times daily before meals. Per sliding scale   Yes [provider]  ipratropium-albuterol (DUONEB) 0.5-2.5 (3) MG/3ML SOLN Take 3 mLs by nebulization See admin instructions. Nebulize and inhale 3 ml's into the lungs every four hours, while awake   Yes [provider]  levalbuterol (XOPENEX HFA) 45 MCG/ACT inhaler Inhale 1-2 puffs into the lungs every 4 (four) hours as needed for wheezing.   Yes [provider]  levothyroxine (SYNTHROID) 112 MCG tablet Take 1 tablet (112 mcg total) by mouth daily before breakfast. 1 AM 04/13/19  Yes Nche, Charlene Brooke, NP  lidocaine (LIDODERM) 5 % Place 1 patch onto the skin daily as needed (pain). Remove & Discard patch within 12 hours or as directed by MD   Yes [provider]  midodrine (PROAMATINE) 10 MG tablet Take 10 mg by mouth See admin instructions. Take 10 mg by mouth on Tuesday, Thursday, Saturday 45 minutes before dialysis   Yes [provider]  montelukast (SINGULAIR) 10  MG tablet Take 10 mg by mouth at bedtime.    Yes [provider]  nystatin (MYCOSTATIN/NYSTOP) powder Apply 1 g topically 2 (two) times daily as needed (as directed- to any rashes).    Yes [provider]  nystatin cream (MYCOSTATIN) Apply 1 application topically 2 (two) times daily as needed for dry skin.    Yes [provider]  rosuvastatin (CRESTOR) 20 MG tablet Take 1 tablet (20 mg total) by mouth at bedtime. 04/13/19  Yes Nche, Charlene Brooke, NP  ascorbic acid (VITAMIN C) 500 MG tablet Take 1 tablet (500 mg total) by mouth daily. 02/23/19   Hosie Poisson, MD  benzonatate (TESSALON) 200 MG capsule Take 1 capsule (200 mg total) by mouth 2 (two) times daily as needed for cough. 02/08/19   Nche, Charlene Brooke, NP  Darbepoetin Alfa (ARANESP) 25 MCG/0.42ML SOSY injection Inject 0.42 mLs (25 mcg total) into the vein every Tuesday with hemodialysis. 11/22/18   Hosie Poisson, MD  docusate sodium (COLACE) 100 MG capsule Take  100 mg by mouth every morning.    [provider]  Lubricants (K-Y LUBRICANT JELLY SENSITIVE EX) Place 1 application into both nostrils as needed (as directed- for lubrication).     [provider]  multivitamin (RENA-VIT) TABS tablet Take 1 tablet by mouth at bedtime. 11/21/18   Hosie Poisson, MD  NASAL SPRAY SALINE NA Place 1-2 sprays into both nostrils as needed (for congestion- "[employer] Simply Saline" brand).    [provider]  Nutritional Supplements (FEEDING SUPPLEMENT, NEPRO CARB STEADY,) LIQD Take 237 mLs by mouth 2 (two) times daily between meals. 11/22/18   Hosie Poisson, MD  OVER THE COUNTER MEDICATION Stool softner otc    [provider]  oxymetazoline (AFRIN) 0.05 % nasal spray Place 1 spray into both nostrils 2 (two) times daily as needed for congestion.    [provider]  senna (SENOKOT) 8.6 MG TABS tablet Take 1 tablet by mouth every morning.    [provider]  sevelamer carbonate  (RENVELA) 800 MG tablet Take 1 tablet (800 mg total) by mouth as needed (with snacks). 02/22/19   Hosie Poisson, MD  VENTOLIN HFA 108 (90 Base) MCG/ACT inhaler Inhale 2 puffs into the lungs every 6 (six) hours as needed for wheezing or shortness of breath. 02/14/19   Nche, Charlene Brooke, NP  heparin 5000 UNIT/ML injection Inject 1.5 mLs (7,500 Units total) into the skin every 8 (eight) hours. Patient not taking: Reported on 04/20/2019 02/22/19 04/20/19  Hosie Poisson, MD    Physical Exam: Vitals:   04/20/19 0145 04/20/19 0230 04/20/19 0245 04/20/19 0300  BP: 140/88 134/87 (!) 144/80 135/70  Pulse: 94 94 98 96  Resp: (!) 22 (!) 23 19 (!) 22  Temp:      SpO2: 94% 94% 93% 95%     Constitutional: NAD, sleeping   Eyes: PERTLA, lids and conjunctivae normal ENMT: Mucous membranes are moist. Posterior pharynx clear of any exudate or lesions.   Neck: normal, supple, no masses, no thyromegaly Respiratory:  no wheezing, no crackles. No accessory muscle use.  Cardiovascular: S1 & S2 heard, regular rate and rhythm. Mild leg swelling bilaterally. Abdomen: No distension, no tenderness, soft. Bowel sounds active.  Musculoskeletal: no clubbing / cyanosis. No joint deformity upper and lower extremities.   Skin: no significant rashes, lesions, ulcers. Warm, dry, well-perfused. Neurologic: No gross facial asymmetry, PERRL. Sensation intact. Moving all extremities. Assessment limited by poor patient cooperation.  Psychiatric: Sleeping, wakes to voice. Makes eye-contact but not answering questions.     Labs and Imaging on Admission: I have personally reviewed following labs and imaging studies  CBC: Recent Labs  Lab 04/15/19 2250 04/19/19 2226  WBC 14.9* 9.8  NEUTROABS 12.5* 7.1  HGB 11.0* 11.0*  HCT 37.5 37.4  MCV 103.3* 101.4*  PLT 177 774   Basic Metabolic Panel: Recent Labs  Lab 04/15/19 2250 04/19/19 2226  NA 137 141  K 4.1 4.0  CL 94* 96*  CO2 27 27  GLUCOSE 249* 136*  BUN 10 37*    CREATININE 2.39* 7.69*  CALCIUM 9.2 9.6   GFR: Estimated Creatinine Clearance: 6.6 mL/min (A) (by C-G formula based on SCr of 7.69 mg/dL (H)). Liver Function Tests: Recent Labs  Lab 04/15/19 2250 04/19/19 2226  AST 21 47*  ALT 16 15  ALKPHOS 110 107  BILITOT 1.0 1.1  PROT 6.5 6.2*  ALBUMIN 2.8* 2.7*   No results for input(s): LIPASE, AMYLASE in the last 168 hours. No results  for input(s): AMMONIA in the last 168 hours. Coagulation Profile: Recent Labs  Lab 04/15/19 2250  INR 1.0   Cardiac Enzymes: No results for input(s): CKTOTAL, CKMB, CKMBINDEX, TROPONINI in the last 168 hours. BNP (last 3 results) Recent Labs    04/10/19 1418  PROBNP 81.0   HbA1C: No results for input(s): HGBA1C in the last 72 hours. CBG: Recent Labs  Lab 04/15/19 2321 04/19/19 2256  GLUCAP 232* 132*   Lipid Profile: No results for input(s): CHOL, HDL, LDLCALC, TRIG, CHOLHDL, LDLDIRECT in the last 72 hours. Thyroid Function Tests: No results for input(s): TSH, T4TOTAL, FREET4, T3FREE, THYROIDAB in the last 72 hours. Anemia Panel: No results for input(s): VITAMINB12, FOLATE, FERRITIN, TIBC, IRON, RETICCTPCT in the last 72 hours. Urine analysis:    Component Value Date/Time   COLORURINE YELLOW 04/20/2018 0301   APPEARANCEUR HAZY (A) 04/20/2018 0301   LABSPEC 1.009 04/20/2018 0301   PHURINE 9.0 (H) 04/20/2018 0301   GLUCOSEU 150 (A) 04/20/2018 0301   HGBUR MODERATE (A) 04/20/2018 0301   BILIRUBINUR NEGATIVE 04/20/2018 0301   KETONESUR NEGATIVE 04/20/2018 0301   PROTEINUR 100 (A) 04/20/2018 0301   NITRITE NEGATIVE 04/20/2018 0301   LEUKOCYTESUR NEGATIVE 04/20/2018 0301   Sepsis Labs: @LABRCNTIP (procalcitonin:4,lacticidven:4) ) Recent Results (from the past 240 hour(s))  SARS CORONAVIRUS 2 (TAT 6-24 HRS) Nasopharyngeal Nasopharyngeal Swab     Status: None   Collection Time: 04/15/19 10:55 PM   Specimen: Nasopharyngeal Swab  Result Value Ref Range Status   SARS Coronavirus 2  NEGATIVE NEGATIVE Final    Comment: (NOTE) SARS-CoV-2 target nucleic acids are NOT DETECTED. The SARS-CoV-2 RNA is generally detectable in upper and lower respiratory specimens during the acute phase of infection. Negative results do not preclude SARS-CoV-2 infection, do not rule out co-infections with other pathogens, and should not be used as the sole basis for treatment or other patient management decisions. Negative results must be combined with clinical observations, patient history, and epidemiological information. The expected result is Negative. Fact Sheet for Patients: SugarRoll.be Fact Sheet for Healthcare Providers: https://www.woods-mathews.com/ This test is not yet approved or cleared by the [place] FDA and  has been authorized for detection and/or diagnosis of SARS-CoV-2 by FDA under an Emergency Use Authorization (EUA). This EUA will remain  in effect (meaning this test can be used) for the duration of the COVID-19 declaration under Section 56 4(b)(1) of the Act, 21 U.S.C. section 360bbb-3(b)(1), unless the authorization is terminated or revoked sooner. Performed at [facility], [place] [address], [place], [place] [address]      Radiological Exams on Admission: CT Head Wo Contrast  Result Date: 04/19/2019 CLINICAL DATA:  Encephalopathy, confusion, diabetes EXAM: CT HEAD WITHOUT CONTRAST TECHNIQUE: Contiguous axial images were obtained from the base of the skull through the vertex without intravenous contrast. COMPARISON:  04/15/2019 FINDINGS: Brain: Chronic small-vessel ischemic changes throughout the periventricular white matter and central pons. No acute infarct or hemorrhage. Lateral ventricles and midline structures are unremarkable. Stable diffuse cerebral atrophy. No acute extra-axial fluid collections. Stable bilateral subdural hygromas along the convexities. No mass effect. Vascular: No hyperdense vessel or  unexpected calcification. Skull: Normal. Negative for fracture or focal lesion. Sinuses/Orbits: No acute finding. Other: None IMPRESSION: 1. Stable exam, no acute process. Electronically Signed   By: Randa Ngo M.D.   On: 04/19/2019 23:25   DG Chest Portable 1 View  Result Date: 04/19/2019 CLINICAL DATA:  Altered mental status EXAM: PORTABLE CHEST 1 VIEW COMPARISON:  April 15, 2019 FINDINGS: There is unchanged cardiomegaly, the patient is slightly rotated. Aortic calcifications are seen. A right-sided central venous catheter with the tip just entering the right atrium. Again noted are mildly increased interstitial markings throughout both lungs, not significantly changed since the prior exam. There is also stable right basilar small pleural effusion. No acute osseous abnormality. IMPRESSION: Unchanged mildly increased interstitial markings throughout both lungs which could be due to chronic lung changes or mild interstitial edema. Trace right pleural effusion. Electronically Signed   By: Prudencio Pair M.D.   On: 04/19/2019 22:48    EKG: Independently reviewed. Sinus tachycardia, rate 100, PVCs.   Assessment/Plan   1. Acute encephalopathy  - Presents with increased confusion at home where she also reported a headache and seemed to be favoring her right side more per family  - She has had intermittent confusion per chart notes, is bed bound at home, and family noted that they do not think they can care for her there  - She is somnolent in ED with no acute findings on head CT, largely unremarkable basic labs for ESRD patient  - Check MRI brain, ammonia, RPR, B12     2. ESRD  - Last dialyzed 3/20 and refused at last appt  - No urgent indication for HD now, SLIV, renally-dose medications    3. Insulin-dependent DM  - A1c was down to 5.4% earlier this month  - Check CBGs and use sensitive-scale Novolog only for now    4. Chronic diastolic CHF  - Appears compensated, continue Lasix and HD     5. Hypothyroidism  - She had elevated TSH and normal T4 earlier this month, followed by PCP  - Continue Synthroid    6. COPD with chronic hypoxic respiratory failure  - No cough or wheezing  - Continue supplemental O2 and breathing treatments    7. Depression, anxiety  - Hold potentially sedating medications until she becomes more alert   8. History of CVA  - Checking MRI brain as above  - Continue Crestor (dose reduced for ESRD)    DVT prophylaxis: sq heparin  Code Status: Full  Family Communication: Discussed with patient  Disposition Plan: From home but family do not think they can adequately care for her and PT consulted to help determine dispo, will likely be medically ready for discharge within 24 hours Consults called: None  Admission status: Observation     Vianne Bulls, MD [facility] Pager: See [email or web address]  If 7AM-7PM, please contact the daytime attending [email or web address]  04/20/2019, 4:10 AM

## 2019-04-20 NOTE — Consult Note (Addendum)
[facility] Renal Consultation Note    Indication for Consultation:  Management of ESRD/hemodialysis; anemia, hypertension/volume and secondary hyperparathyroidism  HPI: Joan Mosley is a 74 y.o. female with ESRD, COPD 5L O2 dependent, DM, hx COVID earlier this year, dementia, hx CVA, hypothyroidism who dialyzes TTS at [place] presents  with .Marland KitchenShe has had a very stormy year with multiple admission including two LTAC admission.  Currently she is residing at home and requires PTAR to transport to dialysis.  She is a full Code.  She last attended dialysis 3/20 and ran full time with net UF 1.1 - EDW has been lowered serially and most recently was 88.  She presents to the ED today with increased confusion and headache.  Family reports per H and P that she is essentially bedbound at home and they cannot adequately care for her. She is currently seen on HD. She tells me she is at the hospital, [facility]. Barbette Or is president but thinks it is November and has no idea as to the year.  Believes she is here because of SOB.  Work up in the EDW shows stable VS, K 3.8 BUN 38 Cr 7.5 glu 129 alb 2.6 hgb 11.1 WBC 9.7 with normal diff. MRI showed small acute to subacute right temporal infarct and mod to adv chronic microvascular ischemic changes, minimal residual chronic subdural collections.  Past Medical History:  Diagnosis Date  . Arthritis   . COPD (chronic obstructive pulmonary disease) ([place])   . Diabetes mellitus without complication ([place])   . Diabetic neuropathy ([place])   . 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 Renal disorder    ESRD  . Thyroid disease   . TIA (transient ischemic attack)    Past Surgical History:  Procedure Laterality Date  . ABDOMINAL HYSTERECTOMY    . IR FLUORO GUIDE CV LINE RIGHT  11/14/2018  . RECTOCELE REPAIR    . REPLACEMENT TOTAL KNEE BILATERAL Bilateral 2008  . THYROIDECTOMY     80%  . VAGINAL WOUND CLOSURE / REPAIR     Family History  Problem Relation  Age of Onset  . Heart disease Mother   . Hypertension Mother   . Heart disease Father   . Hypertension Father    Social History:  reports that she quit smoking about 38 years ago. She has a 35.00 pack-year smoking history. She has never used smokeless tobacco. She reports that she does not drink alcohol or use drugs. Allergies  Allergen Reactions  . Avelox [Moxifloxacin Hcl In Nacl] Other (See Comments)    Unknown reaction  . Codeine Anaphylaxis  . Penicillins Rash    Did it involve swelling of the face/tongue/throat, SOB, or low BP? Unk Did it involve sudden or severe rash/hives, skin peeling, or any reaction on the inside of your mouth or nose? Yes Did you need to seek medical attention at a hospital or doctor's office? Unk When did it last happen? Unk If all above answers are "NO", may proceed with cephalosporin use.   . Sulfa Antibiotics Other (See Comments)    Unknown reaction  . Dextromethorphan-Guaifenesin Other (See Comments)    Reported by [facility] - unknown reaction. Pt states she is not allergic to dextromethorphan-guaifenesin.  . Shellfish Allergy Hives, Itching and Rash   Prior to Admission medications   Medication Sig Start Date End Date Taking? Authorizing Provider  acetaminophen (TYLENOL) 500 MG tablet Take 1,000 mg by mouth every 6 (six) hours as needed (for arthritic pain).  Yes [provider]  calcitRIOL (ROCALTROL) 0.25 MCG capsule Take 7 capsules (1.75 mcg total) by mouth Every Tuesday,Thursday,and Saturday with dialysis. 11/22/18  Yes Hosie Poisson, MD  clonazePAM (KLONOPIN) 0.5 MG tablet TAKE 1/2 (ONE-HALF) TABLET BY MOUTH IN THE MORNING AND 1 IN THE EVENING 04/18/19  Yes Nche, Charlene Brooke, NP  FLUoxetine (PROZAC) 10 MG tablet Take 1 tablet (10 mg total) by mouth daily. 02/08/19  Yes Nche, Charlene Brooke, NP  food thickener (THICK IT) POWD Take by mouth as needed (drinks).   Yes [provider]  furosemide (LASIX) 80 MG tablet Take 80 mg by  mouth.   Yes [provider]  guaiFENesin (MUCINEX) 600 MG 12 hr tablet Take 1 tablet (600 mg total) by mouth 2 (two) times daily. 02/22/19  Yes Hosie Poisson, MD  insulin detemir (LEVEMIR) 100 UNIT/ML injection Inject 15 Units into the skin every morning.   Yes [provider]  insulin lispro (HUMALOG) 100 UNIT/ML injection Inject into the skin 3 (three) times daily before meals. Per sliding scale   Yes [provider]  ipratropium-albuterol (DUONEB) 0.5-2.5 (3) MG/3ML SOLN Take 3 mLs by nebulization See admin instructions. Nebulize and inhale 3 ml's into the lungs every four hours, while awake   Yes [provider]  levalbuterol (XOPENEX HFA) 45 MCG/ACT inhaler Inhale 1-2 puffs into the lungs every 4 (four) hours as needed for wheezing.   Yes [provider]  levothyroxine (SYNTHROID) 112 MCG tablet Take 1 tablet (112 mcg total) by mouth daily before breakfast. 1 AM 04/13/19  Yes Nche, Charlene Brooke, NP  lidocaine (LIDODERM) 5 % Place 1 patch onto the skin daily as needed (pain). Remove & Discard patch within 12 hours or as directed by MD   Yes [provider]  midodrine (PROAMATINE) 10 MG tablet Take 10 mg by mouth See admin instructions. Take 10 mg by mouth on Tuesday, Thursday, Saturday 45 minutes before dialysis   Yes [provider]  montelukast (SINGULAIR) 10 MG tablet Take 10 mg by mouth at bedtime.    Yes [provider]  nystatin (MYCOSTATIN/NYSTOP) powder Apply 1 g topically 2 (two) times daily as needed (as directed- to any rashes).    Yes [provider]  nystatin cream (MYCOSTATIN) Apply 1 application topically 2 (two) times daily as needed for dry skin.    Yes [provider]  rosuvastatin (CRESTOR) 20 MG tablet Take 1 tablet (20 mg total) by mouth at bedtime. 04/13/19  Yes Nche, Charlene Brooke, NP  ascorbic acid (VITAMIN C) 500 MG tablet Take 1 tablet (500 mg total) by mouth daily. 02/23/19   Hosie Poisson, MD  benzonatate (TESSALON) 200 MG capsule Take 1 capsule (200 mg total) by mouth 2 (two) times daily as needed for cough. 02/08/19   Nche, Charlene Brooke, NP  Darbepoetin Alfa (ARANESP) 25 MCG/0.42ML SOSY injection Inject 0.42 mLs (25 mcg total) into the vein every Tuesday with hemodialysis. 11/22/18   Hosie Poisson, MD  docusate sodium (COLACE) 100 MG capsule Take 100 mg by mouth every morning.    [provider]  Lubricants (K-Y LUBRICANT JELLY SENSITIVE EX) Place 1 application into both nostrils as needed (as directed- for lubrication).     [provider]  multivitamin (RENA-VIT) TABS tablet Take 1 tablet by mouth at bedtime. 11/21/18   Hosie Poisson, MD  NASAL SPRAY SALINE NA Place 1-2 sprays into both nostrils as needed (for congestion- "[employer] Simply Saline" brand).  [provider]  Nutritional Supplements (FEEDING SUPPLEMENT, NEPRO CARB STEADY,) LIQD Take 237 mLs by mouth 2 (two) times daily between meals. 11/22/18   Hosie Poisson, MD  OVER THE COUNTER MEDICATION Stool softner otc    [provider]  oxymetazoline (AFRIN) 0.05 % nasal spray Place 1 spray into both nostrils 2 (two) times daily as needed for congestion.    [provider]  senna (SENOKOT) 8.6 MG TABS tablet Take 1 tablet by mouth every morning.    [provider]  sevelamer carbonate (RENVELA) 800 MG tablet Take 1 tablet (800 mg total) by mouth as needed (with snacks). 02/22/19   Hosie Poisson, MD  VENTOLIN HFA 108 (90 Base) MCG/ACT inhaler Inhale 2 puffs into the lungs every 6 (six) hours as needed for wheezing or shortness of breath. 02/14/19   Nche, Charlene Brooke, NP  heparin 5000 UNIT/ML injection Inject 1.5 mLs (7,500 Units total) into the skin every 8 (eight) hours. Patient not taking: Reported on 04/20/2019 02/22/19 04/20/19  Hosie Poisson, MD   Current Facility-Administered Medications  Medication Dose Route Frequency Provider Last Rate Last Admin  .   stroke: mapping our early stages of recovery book   Does not apply Once Black, Karen M, NP      . 0.9 %  sodium chloride infusion  250 mL Intravenous PRN Opyd, Ilene Qua, MD      . 0.9 %  sodium chloride infusion   Intravenous Continuous Black, Lezlie Octave, NP      . acetaminophen (TYLENOL) tablet 650 mg  650 mg Oral Q6H PRN Opyd, Ilene Qua, MD       Or  . acetaminophen (TYLENOL) suppository 650 mg  650 mg Rectal Q6H PRN Opyd, Ilene Qua, MD      . Chlorhexidine Gluconate Cloth 2 % PADS 6 each  6 each Topical Q0600 Alric Seton, PA-C      . doxercalciferol (HECTOROL) injection 3 mcg  3 mcg Intravenous Q T,Th,Sa-HD Alric Seton, PA-C      . FLUoxetine (PROZAC) capsule 10 mg  10 mg Oral Daily Eulogio Bear U, DO   Stopped at 04/20/19 1006  . furosemide (LASIX) tablet 80 mg  80 mg Oral Daily Radene Gunning, NP   Stopped at 04/20/19 1005  . heparin injection 5,000 Units  5,000 Units Subcutaneous Q8H Opyd, Ilene Qua, MD   5,000 Units at 04/20/19 0555  . insulin aspart (novoLOG) injection 0-6 Units  0-6 Units Subcutaneous TID WC Opyd, Ilene Qua, MD      . ipratropium-albuterol (DUONEB) 0.5-2.5 (3) MG/3ML nebulizer solution 3 mL  3 mL Nebulization Q4H PRN Opyd, Ilene Qua, MD      . levothyroxine (SYNTHROID) tablet 112 mcg  112 mcg Oral QAC breakfast Opyd, Ilene Qua, MD      . midodrine (PROAMATINE) tablet 10 mg  10 mg Oral See admin instructions Opyd, Ilene Qua, MD      . ondansetron (ZOFRAN) tablet 4 mg  4 mg Oral Q6H PRN Opyd, Ilene Qua, MD       Or  . ondansetron (ZOFRAN) injection 4 mg  4 mg Intravenous Q6H PRN Opyd, Ilene Qua, MD      . rosuvastatin (CRESTOR) tablet 10 mg  10 mg Oral q1800 Opyd, Ilene Qua, MD      . senna-docusate (Senokot-S) tablet 1 tablet  1 tablet Oral QHS PRN Radene Gunning, NP      . sevelamer carbonate (RENVELA) tablet 800 mg  800 mg  Oral PRN Opyd, Ilene Qua, MD      . sodium chloride flush (NS) 0.9 % injection 3 mL  3 mL Intravenous Q12H Opyd, Ilene Qua, MD   3 mL at  04/20/19 0943  . sodium chloride flush (NS) 0.9 % injection 3 mL  3 mL Intravenous Q12H Opyd, Ilene Qua, MD   3 mL at 04/20/19 0943  . sodium chloride flush (NS) 0.9 % injection 3 mL  3 mL Intravenous PRN Opyd, Ilene Qua, MD       Current Outpatient Medications  Medication Sig Dispense Refill  . acetaminophen (TYLENOL) 500 MG tablet Take 1,000 mg by mouth every 6 (six) hours as needed (for arthritic pain).     . calcitRIOL (ROCALTROL) 0.25 MCG capsule Take 7 capsules (1.75 mcg total) by mouth Every Tuesday,Thursday,and Saturday with dialysis.    Marland Kitchen clonazePAM (KLONOPIN) 0.5 MG tablet TAKE 1/2 (ONE-HALF) TABLET BY MOUTH IN THE MORNING AND 1 IN THE EVENING 45 tablet 0  . FLUoxetine (PROZAC) 10 MG tablet Take 1 tablet (10 mg total) by mouth daily. 90 tablet 3  . food thickener (THICK IT) POWD Take by mouth as needed (drinks).    . furosemide (LASIX) 80 MG tablet Take 80 mg by mouth.    Marland Kitchen guaiFENesin (MUCINEX) 600 MG 12 hr tablet Take 1 tablet (600 mg total) by mouth 2 (two) times daily.    . insulin detemir (LEVEMIR) 100 UNIT/ML injection Inject 15 Units into the skin every morning.    . insulin lispro (HUMALOG) 100 UNIT/ML injection Inject into the skin 3 (three) times daily before meals. Per sliding scale    . ipratropium-albuterol (DUONEB) 0.5-2.5 (3) MG/3ML SOLN Take 3 mLs by nebulization See admin instructions. Nebulize and inhale 3 ml's into the lungs every four hours, while awake    . levalbuterol (XOPENEX HFA) 45 MCG/ACT inhaler Inhale 1-2 puffs into the lungs every 4 (four) hours as needed for wheezing.    Marland Kitchen levothyroxine (SYNTHROID) 112 MCG tablet Take 1 tablet (112 mcg total) by mouth daily before breakfast. 1 AM 90 tablet 1  . lidocaine (LIDODERM) 5 % Place 1 patch onto the skin daily as needed (pain). Remove & Discard patch within 12 hours or as directed by MD    . midodrine (PROAMATINE) 10 MG tablet Take 10 mg by mouth See admin instructions. Take 10 mg by mouth on Tuesday, Thursday,  Saturday 45 minutes before dialysis    . montelukast (SINGULAIR) 10 MG tablet Take 10 mg by mouth at bedtime.     Marland Kitchen nystatin (MYCOSTATIN/NYSTOP) powder Apply 1 g topically 2 (two) times daily as needed (as directed- to any rashes).     . nystatin cream (MYCOSTATIN) Apply 1 application topically 2 (two) times daily as needed for dry skin.     . rosuvastatin (CRESTOR) 20 MG tablet Take 1 tablet (20 mg total) by mouth at bedtime. 90 tablet 3  . ascorbic acid (VITAMIN C) 500 MG tablet Take 1 tablet (500 mg total) by mouth daily.    . benzonatate (TESSALON) 200 MG capsule Take 1 capsule (200 mg total) by mouth 2 (two) times daily as needed for cough. 30 capsule 0  . Darbepoetin Alfa (ARANESP) 25 MCG/0.42ML SOSY injection Inject 0.42 mLs (25 mcg total) into the vein every Tuesday with hemodialysis. 14.28 mL   . docusate sodium (COLACE) 100 MG capsule Take 100 mg by mouth every morning.    . Lubricants (K-Y LUBRICANT JELLY SENSITIVE EX) Place 1 application  into both nostrils as needed (as directed- for lubrication).     . multivitamin (RENA-VIT) TABS tablet Take 1 tablet by mouth at bedtime.  0  . NASAL SPRAY SALINE NA Place 1-2 sprays into both nostrils as needed (for congestion- "Arm [employer] Hammer Simply Saline" brand).    . Nutritional Supplements (FEEDING SUPPLEMENT, NEPRO CARB STEADY,) LIQD Take 237 mLs by mouth 2 (two) times daily between meals.  0  . OVER THE COUNTER MEDICATION Stool softner otc    . oxymetazoline (AFRIN) 0.05 % nasal spray Place 1 spray into both nostrils 2 (two) times daily as needed for congestion.    . senna (SENOKOT) 8.6 MG TABS tablet Take 1 tablet by mouth every morning.    . sevelamer carbonate (RENVELA) 800 MG tablet Take 1 tablet (800 mg total) by mouth as needed (with snacks).    . VENTOLIN HFA 108 (90 Base) MCG/ACT inhaler Inhale 2 puffs into the lungs every 6 (six) hours as needed for wheezing or shortness of breath. 18 g 2   Labs: Basic Metabolic Panel: Recent Labs   Lab 04/15/19 2250 04/19/19 2226 04/20/19 0558  NA 137 141 140  K 4.1 4.0 3.8  CL 94* 96* 97*  CO2 27 27 28   GLUCOSE 249* 136* 129*  BUN 10 37* 38*  CREATININE 2.39* 7.69* 7.61*  CALCIUM 9.2 9.6 9.6   Liver Function Tests: Recent Labs  Lab 04/15/19 2250 04/19/19 2226 04/20/19 0558  AST 21 47* 48*  ALT 16 15 15   ALKPHOS 110 107 108  BILITOT 1.0 1.1 0.9  PROT 6.5 6.2* 6.2*  ALBUMIN 2.8* 2.7* 2.6*   No results for input(s): LIPASE, AMYLASE in the last 168 hours. No results for input(s): AMMONIA in the last 168 hours. CBC: Recent Labs  Lab 04/15/19 2250 04/19/19 2226 04/20/19 0558  WBC 14.9* 9.8 9.7  NEUTROABS 12.5* 7.1 7.2  HGB 11.0* 11.0* 11.1*  HCT 37.5 37.4 37.5  MCV 103.3* 101.4* 102.2*  PLT 177 181 191   Cardiac Enzymes: No results for input(s): CKTOTAL, CKMB, CKMBINDEX, TROPONINI in the last 168 hours. CBG: Recent Labs  Lab 04/15/19 2321 04/19/19 2256 04/20/19 0815 04/20/19 1208  GLUCAP 232* 132* 103* 96   Iron Studies: No results for input(s): IRON, TIBC, TRANSFERRIN, FERRITIN in the last 72 hours. Studies/Results: CT Head Wo Contrast  Result Date: 04/19/2019 CLINICAL DATA:  Encephalopathy, confusion, diabetes EXAM: CT HEAD WITHOUT CONTRAST TECHNIQUE: Contiguous axial images were obtained from the base of the skull through the vertex without intravenous contrast. COMPARISON:  04/15/2019 FINDINGS: Brain: Chronic small-vessel ischemic changes throughout the periventricular white matter and central pons. No acute infarct or hemorrhage. Lateral ventricles and midline structures are unremarkable. Stable diffuse cerebral atrophy. No acute extra-axial fluid collections. Stable bilateral subdural hygromas along the convexities. No mass effect. Vascular: No hyperdense vessel or unexpected calcification. Skull: Normal. Negative for fracture or focal lesion. Sinuses/Orbits: No acute finding. Other: None IMPRESSION: 1. Stable exam, no acute process. Electronically  Signed   By: Randa Ngo M.D.   On: 04/19/2019 23:25   MR BRAIN WO CONTRAST  Result Date: 04/20/2019 CLINICAL DATA:  Encephalopathy EXAM: MRI HEAD WITHOUT CONTRAST TECHNIQUE: Multiplanar, multiecho pulse sequences of the brain and surrounding structures were obtained without intravenous contrast. COMPARISON:  None. FINDINGS: Brain: There a small focus mildly reduced diffusion in the right temporal lobe. No hemorrhage. There is no intracranial mass, mass effect, or edema. Patchy and confluent areas of T2 hyperintensity in the supratentorial and  pontine white matter are nonspecific but probably reflect moderate to advanced chronic microvascular ischemic changes. There is bilateral cerebral convexity dural thickening or minimal residual chronic subdural collections. Prominence of the ventricles and sulci reflects mild generalized parenchymal volume loss. There is no hydrocephalus or extra-axial fluid collection. Vascular: Major vessel flow voids at the skull base are preserved. Skull and upper cervical spine: Normal marrow signal is preserved. Sinuses/Orbits: Paranasal sinuses are aerated. Orbits are unremarkable. Other: Sella is unremarkable.  Nonspecific mastoid effusions. IMPRESSION: Small acute to subacute right temporal infarct. Moderate to advanced chronic microvascular ischemic changes. Dural thickening versus minimal residual chronic subdural collections. Nonspecific mastoid effusions. Electronically Signed   By: Macy Mis M.D.   On: 04/20/2019 10:06   DG Chest Portable 1 View  Result Date: 04/19/2019 CLINICAL DATA:  Altered mental status EXAM: PORTABLE CHEST 1 VIEW COMPARISON:  April 15, 2019 FINDINGS: There is unchanged cardiomegaly, the patient is slightly rotated. Aortic calcifications are seen. A right-sided central venous catheter with the tip just entering the right atrium. Again noted are mildly increased interstitial markings throughout both lungs, not significantly changed since the  prior exam. There is also stable right basilar small pleural effusion. No acute osseous abnormality. IMPRESSION: Unchanged mildly increased interstitial markings throughout both lungs which could be due to chronic lung changes or mild interstitial edema. Trace right pleural effusion. Electronically Signed   By: Prudencio Pair M.D.   On: 04/19/2019 22:48    ROS: As per HPI otherwise negative.  Physical Exam: Vitals:   04/20/19 1215 04/20/19 1226 04/20/19 1250 04/20/19 1256  BP: 114/78  129/79 (P) 120/82  Pulse: 90 90 97 (P) 91  Resp: 16 19 (!) 29   Temp:   98 F (36.7 C)   TempSrc:   Oral   SpO2: 96% 98% (P) 97%   Weight:   87.1 kg      General: WDWN NAD obeses -sats on on 4 L Head: NCAT sclera not icteric MMM Neck: Supple.  Lungs: Clear anteriorly Breathing is unlabored at rest Heart: RRR with S1 S2.  Abdomen: soft NT + BS obese with large panus GU-foley chronic Lower extremities: 1-2+ LE edema Neuro: Alert - intermittently confused .HOH at baseline Psych:  Responds to questions appropriately with usual affect. Dialysis Access: right [facility]   Dialysis Orders: [facility] 4 hr 400/800 EDW 88 2 K 2 Ca TDC Mircera 100 q 2 weeks - most recently given 3/20; Hectorol 3 -no heparin except to block catheter Recent labs: hgb 9.7 iPTH 725  P 7.1 2 renvela ac   Assessment/Plan: 1. new acute/subacute infarct - neuro following 2. ESRD -  TTS - added K bath today  3. Hypertension/volume  - midodrine for BP support - 10 mg pre and 10 mg mid for SBP < 95 -keeping even because of low BP - midodrine help due to NPO status 4. Anemia  - hgb 11.1 - stable - no ESA for now 5. Metabolic bone disease -  Continue Hectorol - resume binders when taking pos 6. Nutrition - NPO 7. GOC - need to revisit given dementia, recurrent CVA, poor functional status requires PTAR transportation to HD 8. DM/hypothyroidism- per primary 9. COPD - high O2 dependency 10. [place] [place]  [facility] [phone] 04/20/2019, 2:51 PM

## 2019-04-20 NOTE — ED Notes (Signed)
Pt transported to MRI 

## 2019-04-20 NOTE — Evaluation (Signed)
Physical Therapy Evaluation Patient Details Name: Joan Mosley MRN: [id number] DOB: 05/03/1945 Today's Date: 04/20/2019   History of Present Illness  Pt is a 74 y/o female admitted secondary to confusion and headache. Per notes, pt has also become close to bed bound secondary to progressive weakness. PMH includes DM, ESRD on HD, COPD, dCHF, and recent COVID infection.   Clinical Impression  Pt admitted secondary to problem above with deficits below. Pt not responding to commands or questions. Would open eyes, but would not keep them open. Required total A to roll from side to side. Feel pt will require increased assist at d/c. Will continue to follow acutely to maximize functional mobility independence and safety.     Follow Up Recommendations SNF;Supervision/Assistance - 24 hour    Equipment Recommendations  None recommended by PT    Recommendations for Other Services       Precautions / Restrictions Precautions Precautions: Fall Restrictions Weight Bearing Restrictions: No      Mobility  Bed Mobility Overal bed mobility: Needs Assistance Bed Mobility: Rolling Rolling: Total assist         General bed mobility comments: Pt unable to assist with any bed mobility. Required total A to roll from side to side. Unsafe to attempt further mobility with +1 assist.   Transfers                    Ambulation/Gait                Stairs            Wheelchair Mobility    Modified Rankin (Stroke Patients Only) Modified Rankin (Stroke Patients Only) Pre-Morbid Rankin Score: Moderately severe disability Modified Rankin: Severe disability     Balance                                             Pertinent Vitals/Pain Pain Assessment: Faces Faces Pain Scale: No hurt    Home Living                   Additional Comments: Unsure of home information, as no family present and pt not responding to questions.     Prior Function            Comments: Unsure of PLOF as pt unable to respond to questions. Per notes, pt possibly bed bound? so would anticipate pt needing increased support.      Hand Dominance        Extremity/Trunk Assessment   Upper Extremity Assessment Upper Extremity Assessment: Difficult to assess due to impaired cognition;Defer to OT evaluation    Lower Extremity Assessment Lower Extremity Assessment: Difficult to assess due to impaired cognition;RLE deficits/detail;LLE deficits/detail RLE Deficits / Details: PROM WFL, however, pt would not follow commands to perform AROM.  LLE Deficits / Details: PROM WFL, however, pt would not follow commands to perform AROM.        Communication   Communication: Expressive difficulties  Cognition Arousal/Alertness: Lethargic Behavior During Therapy: Flat affect Overall Cognitive Status: No family/caregiver present to determine baseline cognitive functioning                                 General Comments: Pt not responding to questions. Would open eyes to command, but would not keep them  open. Would not follow commands to move extremities. Notified RN.       General Comments      Exercises     Assessment/Plan    PT Assessment Patient needs continued PT services  PT Problem List Decreased strength;Decreased activity tolerance;Decreased range of motion;Decreased mobility;Decreased balance;Decreased knowledge of use of DME;Decreased safety awareness;Decreased knowledge of precautions;Decreased cognition       PT Treatment Interventions Gait training;Therapeutic activities;Therapeutic exercise;Functional mobility training;DME instruction;Balance training;Neuromuscular re-education;Cognitive remediation;Patient/family education;Wheelchair mobility training    PT Goals (Current goals can be found in the Care Plan section)  Acute Rehab PT Goals PT Goal Formulation: Patient unable to participate in goal setting Time For Goal  Achievement: 05/04/19 Potential to Achieve Goals: Fair    Frequency Min 2X/week   Barriers to discharge        Co-evaluation               AM-PAC PT "6 Clicks" Mobility  Outcome Measure Help needed turning from your back to your side while in a flat bed without using bedrails?: Total Help needed moving from lying on your back to sitting on the side of a flat bed without using bedrails?: Total Help needed moving to and from a bed to a chair (including a wheelchair)?: Total Help needed standing up from a chair using your arms (e.g., wheelchair or bedside chair)?: Total Help needed to walk in hospital room?: Total Help needed climbing 3-5 steps with a railing? : Total 6 Click Score: 6    End of Session   Activity Tolerance: Patient limited by lethargy Patient left: in bed;with call bell/phone within reach Nurse Communication: Mobility status;Other (comment)(pt not responding to commands or questions) PT Visit Diagnosis: Muscle weakness (generalized) (M62.81);Difficulty in walking, not elsewhere classified (R26.2);Unsteadiness on feet (R26.81)    Time: 8527-7824 PT Time Calculation (min) (ACUTE ONLY): 10 min   Charges:   PT Evaluation $PT Eval Moderate Complexity: 1 Mod          Joan Mosley, PT, DPT  Acute Rehabilitation Services  Pager: [phone] Office: [phone]   Joan Mosley 04/20/2019, 3:56 PM

## 2019-04-20 NOTE — Progress Notes (Signed)
New Admission Note: pt arrived to room 3W34 via stretcher from dialysis.  Arrival Method: via stretcher from dialysis Mental Orientation: pt oriented to self and place.  Telemetry: Yes on box M06 Assessment: Completed Skin: Severe MASD in groin area. Pt has redness and swelling in vaginal area.  IV: left upper arm infusing 68ml/hr 0.9%NS Pain: No complaints of pain or discomfort at this time Safety Measures: Safety Fall Prevention Plan was given, discussed. Admission: Completed 3W: Patient has been orientated to the room, unit and the staff. Family: pt son is at the bedside. Pleasant and supportive. Orders have been reviewed and implemented. Will continue to monitor the patient. Call light has been placed within reach and bed alarm has been activated.   Janus Molder, RN

## 2019-04-21 ENCOUNTER — Inpatient Hospital Stay (HOSPITAL_COMMUNITY): Payer: Medicare Other

## 2019-04-21 DIAGNOSIS — J449 Chronic obstructive pulmonary disease, unspecified: Secondary | ICD-10-CM

## 2019-04-21 DIAGNOSIS — I6389 Other cerebral infarction: Secondary | ICD-10-CM

## 2019-04-21 LAB — ECHOCARDIOGRAM COMPLETE
Height: 61 in
Weight: 3012.37 oz

## 2019-04-21 LAB — GLUCOSE, CAPILLARY
Glucose-Capillary: 103 mg/dL — ABNORMAL HIGH (ref 70–99)
Glucose-Capillary: 113 mg/dL — ABNORMAL HIGH (ref 70–99)
Glucose-Capillary: 148 mg/dL — ABNORMAL HIGH (ref 70–99)
Glucose-Capillary: 129 mg/dL — ABNORMAL HIGH (ref 70–99)

## 2019-04-21 LAB — BASIC METABOLIC PANEL
Anion gap: 17 — ABNORMAL HIGH (ref 5–15)
BUN: 11 mg/dL (ref 8–23)
CO2: 25 mmol/L (ref 22–32)
Calcium: 9.6 mg/dL (ref 8.9–10.3)
Chloride: 98 mmol/L (ref 98–111)
Creatinine, Ser: 3.65 mg/dL — ABNORMAL HIGH (ref 0.44–1.00)
GFR calc Af Amer: 13 mL/min — ABNORMAL LOW (ref >60)
GFR calc non Af Amer: 12 mL/min — ABNORMAL LOW (ref >60)
Glucose, Bld: 113 mg/dL — ABNORMAL HIGH (ref 70–99)
Potassium: 4.2 mmol/L (ref 3.5–5.1)
Sodium: 140 mmol/L (ref 135–145)

## 2019-04-21 LAB — AMMONIA: Ammonia: 35 umol/L (ref 9–35)

## 2019-04-21 LAB — TSH: TSH: 7.728 u[IU]/mL — ABNORMAL HIGH (ref 0.350–4.500)

## 2019-04-21 MED ORDER — ASPIRIN EC 81 MG PO TBEC
81.0000 mg | DELAYED_RELEASE_TABLET | Freq: Every day | ORAL | Status: DC
  Administered 2019-04-21 – 2019-04-26 (×6): 81 mg via ORAL
  Filled 2019-04-21 (×7): qty 1

## 2019-04-21 MED ORDER — DEXTROSE-NACL 5-0.9 % IV SOLN: INTRAVENOUS | Status: DC

## 2019-04-21 NOTE — Progress Notes (Signed)
STROKE TEAM PROGRESS NOTE   HISTORY OF PRESENT ILLNESS (per record) Joan Mosley is a 74 y.o. female with history of TIA, renal disorder, oxygen dependency, diabetic.  While in hospital, neurology was asked to see patient secondary to punctate infarct noted on MRI.  Patient presented to the ED with increased confusion and headache.  Patient apparently had been seen a few days ago for confusion and choking episode but was discharged home at that time.  While in the ED family stated that she is essentially bedbound at home, they do not feel that they can adequately care for her. While in the ED patient was saturating at 100% on 4 L/min of supplemental oxygen, heart rate was 104, respirations were 22 and systolic blood pressure was 140.  EKG did show sinus tachycardia at 100 bpm.  Due to patient's acute encephalopathy patient was sent for CT head and MRI.  Currently patient is getting hemodialysis, she is able to follow commands, she is confused to where she is and cannot give a good history.  LKW: Unknown tpa given?: no, out of window Premorbid modified Rankin scale (mRS): 5 ICH Score: 3-left facial droop, and unable to answer to questions   INTERVAL HISTORY No complaints    OBJECTIVE Vitals:   04/21/19 0806 04/21/19 0807 04/21/19 0811 04/21/19 1220  BP:  116/75  103/64  Pulse: 100   (!) 104  Resp:   18 (!) 22  Temp:    99 F (37.2 C)  TempSrc:    Oral  SpO2:    95%  Weight:      Height:        CBC:  Recent Labs  Lab 04/19/19 2226 04/20/19 0558  WBC 9.8 9.7  NEUTROABS 7.1 7.2  HGB 11.0* 11.1*  HCT 37.4 37.5  MCV 101.4* 102.2*  PLT 181 570    Basic Metabolic Panel:  Recent Labs  Lab 04/20/19 0558 04/21/19 0410  NA 140 140  K 3.8 4.2  CL 97* 98  CO2 28 25  GLUCOSE 129* 113*  BUN 38* 11  CREATININE 7.61* 3.65*  CALCIUM 9.6 9.6    Lipid Panel:     Component Value Date/Time   CHOL 192 04/20/2019 0558   TRIG 244 (H) 04/20/2019 0558   HDL 53  04/20/2019 0558   CHOLHDL 3.6 04/20/2019 0558   VLDL 49 (H) 04/20/2019 0558   LDLCALC 90 04/20/2019 0558   LDLCALC 59 07/01/2018 1149   HgbA1c:  Lab Results  Component Value Date   HGBA1C 5.5 04/20/2019   Urine Drug Screen: No results found for: LABOPIA, COCAINSCRNUR, LABBENZ, AMPHETMU, THCU, LABBARB  Alcohol Level     Component Value Date/Time   ETH <10 04/15/2019 2255    IMAGING  CT Head Wo Contrast 04/19/2019 IMPRESSION:  1. Stable exam, no acute process.   MR ANGIO NECK WO CONTRAST  04/21/2019 IMPRESSION:  1. Mild atheromatous irregularity about the carotid bifurcations without hemodynamically significant stenosis. Otherwise wide patency of both carotid artery systems within the neck.  2. Wide patency of both vertebral arteries within the neck.   MR BRAIN WO CONTRAST 04/20/2019 IMPRESSION:  Small acute to subacute right temporal infarct. Moderate to advanced chronic microvascular ischemic changes. Dural thickening versus minimal residual chronic subdural collections. Nonspecific mastoid effusions.   DG Chest Portable 1 View 04/19/2019 IMPRESSION:  Unchanged mildly increased interstitial markings throughout both lungs which could be due to chronic lung changes or mild interstitial edema. Trace right pleural effusion.   ECHOCARDIOGRAM  COMPLETE 04/21/2019 IMPRESSIONS   1. Left ventricular ejection fraction, by estimation, is 60 to 65%. The left ventricle has normal function. Left ventricular endocardial border not optimally defined to evaluate regional wall motion. Indeterminate diastolic filling due to E-A fusion.   2. Right ventricular systolic function is normal. The right ventricular size is normal. Tricuspid regurgitation signal is inadequate for assessing PA pressure.   3. The mitral valve is degenerative. No evidence of mitral valve regurgitation. No evidence of mitral stenosis.   4. The aortic valve is grossly normal. Aortic valve regurgitation is not visualized. No  aortic stenosis is present.   5. The inferior vena cava is normal in size with greater than 50% respiratory variability, suggesting right atrial pressure of 3 mmHg. Conclusion(s)/Recommendation(s): Very poor windows for TTE. Globally normal LV function but unable to exclude RWMA.    ECG - ST rate 100 BPM. (See cardiology reading for complete details)   PHYSICAL EXAM Blood pressure 103/64, pulse (!) 104, temperature 99 F (37.2 C), temperature source Oral, resp. rate (!) 22, height 5\' 1"  (1.549 m), weight 85.4 kg, SpO2 95 %.   Awake,alert. Face symmetrical.  Tongue midline. EOMI. Strength 5/5 BUE and BLE. Sensory intact. FTN intact bilateral.           ASSESSMENT/PLAN Ms. Joan Mosley is a 74 y.o. female with history of COPD (O2 dependent), ESRD, DM, and hx of TIA presenting with confusion and headache . 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   MRI head - Small acute to subacute right temporal infarct.  MRA neck - Mild atheromatous irregularity about the carotid bifurcations without hemodynamically significant stenosis. Otherwise wide patency of both carotid artery systems within the neck. Wide patency of both vertebral arteries within the neck.   CTA H&N - not ordered  CT Perfusion - not ordered  Carotid Doppler - MRA neck performed - carotid dopplers not indicated.  2D Echo - EF 60 - 65%. No cardiac source of emboli identified.   [employer] Virus 2 - negative (positive 2 months ago)  Ammonia level - pending  LDL - 90  HgbA1c - 5.5  UDS - not ordered  VTE prophylaxis - [place] Heparin Diet  Diet Order            DIET DYS 3 Room service appropriate? No; Fluid consistency: Nectar Thick  Diet effective now               No antithrombotic prior to admission, now on aspirin 81 mg daily  Patient counseled to be compliant with her antithrombotic medications  Ongoing aggressive stroke risk factor management  Therapy recommendations:  SNF  Disposition:  Pending  Hypertension  Home BP meds: none   Current BP meds: none   SBP somewhat low . Permissive hypertension (OK if < 220/120) but gradually normalize in 5-7 days  . Long-term BP goal normotensive  Hyperlipidemia  Home Lipid lowering medication: Crestor 20 mg daily  LDL 90, goal < 70  Current lipid lowering medication: Crestor 10 mg daily (consider increasing statin dose)  Continue statin at discharge  Diabetes  Home diabetic meds: insulin  Current diabetic meds: SSI   HgbA1c 5.5, goal < 7.0 Recent Labs  04/20/19 2110 04/21/19 0645 04/21/19 1218  GLUCAP 94 103* 113*    Other Stroke Risk Factors  Advanced age  Former cigarette smoker - quit  Obesity, Body mass index is 35.57 kg/m., recommend weight loss, diet and exercise as appropriate   Hx stroke/TIA  Other Active Problems  Code status - Full code  ESRD  Mild tachycardia  Temp - 99  Mild anemia   Hospital day # 1  Impression:   Very small right mid-temporal ischemic infarct not likely causing any symptomatology.  Initial presentation was rather non-specific and likely not related to infarct which was incidental.  No evidence of extracranial stenosis or cardioembolism on studies.  LDL good, but will recommend increasing Crestor to reduce to goal <70 for someone with stroke.  DM is well controlled.    BP is very well controlled off treatment.    Cont ASA 81 mg qd for secondary stroke prevention.    Will sign off.  Rogue Jury, MS, MD  To contact Stroke Continuity provider, please refer to [email or web address] After hours, contact General Neurology

## 2019-04-21 NOTE — Progress Notes (Signed)
PROGRESS NOTE    Joan Mosley  QDI:[id number] DOB: 1945-04-09 DOA: 04/19/2019 PCP: Flossie Buffy, NP   Brief Narrative:  74 year old with prior h/o COPD, type 2 DM, ESRD on HD, chronic diastolic heart failure, insulin dependent DM, hypothyroidism, recently admitted in January for acute respiratory failure secondary to COVID-19 now presents to ED for altered mental status.  She was found to be afebrile on 3 to 4 L of nasal cannula oxygen.  Initial CT of the head did not show any acute stroke.  She was admitted for evaluation of stroke.  MRI of the brain shows Small acute to subacute right temporal infarct. Moderate to advanced chronic microvascular ischemic changes.  MRA of the head and neck showsMild atheromatous irregularity about the carotid bifurcations without hemodynamically significant stenosis.   Assessment & Plan:   Principal Problem:   Stroke [facility]) Active Problems:   Type 2 diabetes mellitus with diabetic polyneuropathy, with long-term current use of insulin (HCC)   Hypothyroidism   ESRD (end stage renal disease) (HCC)   Chronic respiratory failure with hypoxia, on home O2 therapy (HCC)   Depression with anxiety   COPD (chronic obstructive pulmonary disease) (HCC)   Acute encephalopathy   Acute right temporal infarct:  Admitted for evaluation of stroke.    Initial CT of the head did not show any acute stroke.  She was admitted for evaluation of stroke.  MRI of the brain shows Small acute to subacute right temporal infarct. Moderate to advanced chronic microvascular ischemic changes.  MRA of the head and neck showsMild atheromatous irregularity about the carotid bifurcations without hemodynamically significant stenosis. Echocardiogram ordered.  Aspirin 81 mg daily.  On crestor 10 mg daily.  Neurology on board Therapy evaluations recommending SNF.  TOC aware.    Hypothyroidism:  Resume synthroid.    End-stage renal disease on dialysis Nephrology on board and  appreciate recommendations.    COPD Patient is on BiPAP at night at home continue the same. No wheezing heard on exam today.   Depression with anxiety Continue with home medications.   Chronic respiratory failure with hypoxia requiring between 3 to 4 L of nasal cannula oxygen Continue the same   DVT prophylaxis: Heparin.  Code Status: Full code.  Family Communication:  Discussed with son  Disposition Plan:  . Patient came from: HOME.            Mar[place] Kitchen Anticipated d/c place: SNF . Barriers to d/c OR conditions which need to be met to effect a safe d/c: pending work up for stroke.    Consultants:   Neurology  Nephrology.    Procedures: MRI brain without contrast    Antimicrobials: None.    Subjective: Pt appears comfortable, denies any chest pain or sob,no nausea or vomiting.   Objective: Vitals:   04/21/19 0807 04/21/19 0811 04/21/19 1220 04/21/19 1303  BP: 116/75  103/64   Pulse:   (!) 104   Resp:  18 (!) 22 19  Temp:   99 F (37.2 C)   TempSrc:   Oral   SpO2:   95%   Weight:      Height:        Intake/Output Summary (Last 24 hours) at 04/21/2019 1506 Last data filed at 04/21/2019 0800 Gross per 24 hour  Intake 9.86 ml  Output -40 ml  Net 49.86 ml   Filed Weights   04/20/19 1250 04/20/19 1642 04/20/19 1737  Weight: 87.1 kg 87.1 kg 85.4 kg  Examination:  General exam: Appears calm and comfortable on 3l it of [place] oxygen.  Respiratory system: Diminished air entry at bases, no wheezing or rhonchi Cardiovascular system: S1 & S2 heard, RRR. No JVD, No pedal edema. Gastrointestinal system: Abdomen is nondistended, soft and nontender. Normal bowel sounds heard. Central nervous system: alert but confused, following commands and responding to questions with yes and no. Decreased strength in the upper and lower extremities, no focal deficits..  Extremities: No pedal edema, cyanosis or clubbing. Skin: No ulcers seen Psychiatry: Flat affect    Data  Reviewed: I have personally reviewed following labs and imaging studies  CBC: Recent Labs  Lab 04/15/19 2250 04/19/19 2226 04/20/19 0558  WBC 14.9* 9.8 9.7  NEUTROABS 12.5* 7.1 7.2  HGB 11.0* 11.0* 11.1*  HCT 37.5 37.4 37.5  MCV 103.3* 101.4* 102.2*  PLT 177 181 704   Basic Metabolic Panel: Recent Labs  Lab 04/15/19 2250 04/19/19 2226 04/20/19 0558 04/21/19 0410  NA 137 141 140 140  K 4.1 4.0 3.8 4.2  CL 94* 96* 97* 98  CO2 _0 GLUCOSE 249* 136* 129* 113*  BUN 10 37* 38* 11  CREATININE 2.39* 7.69* 7.61* 3.65*  CALCIUM 9.2 9.6 9.6 9.6   GFR: Estimated Creatinine Clearance: 13.2 mL/min (A) (by C-G formula based on SCr of 3.65 mg/dL (H)). Liver Function Tests: Recent Labs  Lab 04/15/19 2250 04/19/19 2226 04/20/19 0558  AST 21 47* 48*  ALT _1 ALKPHOS 110 107 108  BILITOT 1.0 1.1 0.9  PROT 6.5 6.2* 6.2*  ALBUMIN 2.8* 2.7* 2.6*   No results for input(s): LIPASE, AMYLASE in the last 168 hours. Recent Labs  Lab 04/21/19 1323  AMMONIA 35   Coagulation Profile: Recent Labs  Lab 04/15/19 2250  INR 1.0   Cardiac Enzymes: No results for input(s): CKTOTAL, CKMB, CKMBINDEX, TROPONINI in the last 168 hours. BNP (last 3 results) Recent Labs    04/10/19 1418  PROBNP 81.0   HbA1C: Recent Labs    04/20/19 0558  HGBA1C 5.5   CBG: Recent Labs  Lab 04/20/19 1754 04/20/19 1941 04/20/19 2110 04/21/19 0645 04/21/19 1218  GLUCAP 52* 122* 94 103* 113*   Lipid Profile: Recent Labs    04/20/19 0558  CHOL 192  HDL 53  LDLCALC 90  TRIG 244*  CHOLHDL 3.6   Thyroid Function Tests: Recent Labs    04/21/19 1323  TSH 7.728*   Anemia Panel: No results for input(s): VITAMINB12, FOLATE, FERRITIN, TIBC, IRON, RETICCTPCT in the last 72 hours. Sepsis Labs: No results for input(s): PROCALCITON, LATICACIDVEN in the last 168 hours.  Recent Results (from the past 240 hour(s))  SARS CORONAVIRUS 2 (TAT 6-24 HRS) Nasopharyngeal Nasopharyngeal Swab      Status: None   Collection Time: 04/15/19 10:55 PM   Specimen: Nasopharyngeal Swab  Result Value Ref Range Status   SARS Coronavirus 2 NEGATIVE NEGATIVE Final    Comment: (NOTE) SARS-CoV-2 target nucleic acids are NOT DETECTED. The SARS-CoV-2 RNA is generally detectable in upper and lower respiratory specimens during the acute phase of infection. Negative results do not preclude SARS-CoV-2 infection, do not rule out co-infections with other pathogens, and should not be used as the sole basis for treatment or other patient management decisions. Negative results must be combined with clinical observations, patient history, and epidemiological information. The expected result is Negative. Fact Sheet for Patients: SugarRoll.be Fact Sheet for Healthcare Providers: https://www.woods-mathews.com/ This test is not yet approved or  cleared by the [place] and  has been authorized for detection and/or diagnosis of SARS-CoV-2 by FDA under an Emergency Use Authorization (EUA). This EUA will remain  in effect (meaning this test can be used) for the duration of the COVID-19 declaration under Section 56 4(b)(1) of the Act, 21 U.S.C. section 360bbb-3(b)(1), unless the authorization is terminated or revoked sooner. Performed at [facility], [place] [address] [place], [place] [address]          Radiology Studies: CT Head Wo Contrast  Result Date: 04/19/2019 CLINICAL DATA:  Encephalopathy, confusion, diabetes EXAM: CT HEAD WITHOUT CONTRAST TECHNIQUE: Contiguous axial images were obtained from the base of the skull through the vertex without intravenous contrast. COMPARISON:  04/15/2019 FINDINGS: Brain: Chronic small-vessel ischemic changes throughout the periventricular white matter and central pons. No acute infarct or hemorrhage. Lateral ventricles and midline structures are unremarkable. Stable diffuse cerebral atrophy. No acute  extra-axial fluid collections. Stable bilateral subdural hygromas along the convexities. No mass effect. Vascular: No hyperdense vessel or unexpected calcification. Skull: Normal. Negative for fracture or focal lesion. Sinuses/Orbits: No acute finding. Other: None IMPRESSION: 1. Stable exam, no acute process. Electronically Signed   By: Randa Ngo M.D.   On: 04/19/2019 23:25   MR ANGIO NECK WO CONTRAST  Result Date: 04/21/2019 CLINICAL DATA:  Follow-up examination for acute stroke. EXAM: MRA NECK WITHOUT  CONTRAST TECHNIQUE: Multiplanar and multiecho pulse sequences of the neck were obtained without intravenous contrast. Angiographic images of the neck were obtained using MRA technique without and with intravenous contrast. COMPARISON:  Prior MRI from 04/20/2019. FINDINGS: AORTIC ARCH: Examination somewhat technically limited by lack of IV contrast. Aortic arch and origin of the great vessels not assessed on this examination. Partially visualized subclavian arteries are widely patent. RIGHT CAROTID SYSTEM: Right common carotid artery patent from its origin to the bifurcation without stenosis. Mild atheromatous irregularity about the right bifurcation without hemodynamically significant stenosis. Right ICA widely patent to the skull base without stenosis, occlusion, or obvious dissection. LEFT CAROTID SYSTEM: Visualized left common carotid artery widely patent to the bifurcation. Mild atheromatous irregularity about the left bifurcation without definite flow-limiting stenosis. Left ICA patent distally to the skull base without stenosis, occlusion, or definite dissection. VERTEBRAL ARTERIES: Origin of the left vertebral artery not seen. Visualized vertebral arteries widely patent within the neck with antegrade flow, with no appreciable stenosis or other abnormality. Vertebral arteries are largely codominant. IMPRESSION: 1. Mild atheromatous irregularity about the carotid bifurcations without hemodynamically  significant stenosis. Otherwise wide patency of both carotid artery systems within the neck. 2. Wide patency of both vertebral arteries within the neck. Electronically Signed   By: Jeannine Boga M.D.   On: 04/21/2019 01:05   MR BRAIN WO CONTRAST  Result Date: 04/20/2019 CLINICAL DATA:  Encephalopathy EXAM: MRI HEAD WITHOUT CONTRAST TECHNIQUE: Multiplanar, multiecho pulse sequences of the brain and surrounding structures were obtained without intravenous contrast. COMPARISON:  None. FINDINGS: Brain: There a small focus mildly reduced diffusion in the right temporal lobe. No hemorrhage. There is no intracranial mass, mass effect, or edema. Patchy and confluent areas of T2 hyperintensity in the supratentorial and pontine white matter are nonspecific but probably reflect moderate to advanced chronic microvascular ischemic changes. There is bilateral cerebral convexity dural thickening or minimal residual chronic subdural collections. Prominence of the ventricles and sulci reflects mild generalized parenchymal volume loss. There is no hydrocephalus or extra-axial fluid collection. Vascular: Major vessel flow voids at the skull base  are preserved. Skull and upper cervical spine: Normal marrow signal is preserved. Sinuses/Orbits: Paranasal sinuses are aerated. Orbits are unremarkable. Other: Sella is unremarkable.  Nonspecific mastoid effusions. IMPRESSION: Small acute to subacute right temporal infarct. Moderate to advanced chronic microvascular ischemic changes. Dural thickening versus minimal residual chronic subdural collections. Nonspecific mastoid effusions. Electronically Signed   By: Macy Mis M.D.   On: 04/20/2019 10:06   DG Chest Portable 1 View  Result Date: 04/19/2019 CLINICAL DATA:  Altered mental status EXAM: PORTABLE CHEST 1 VIEW COMPARISON:  April 15, 2019 FINDINGS: There is unchanged cardiomegaly, the patient is slightly rotated. Aortic calcifications are seen. A right-sided central  venous catheter with the tip just entering the right atrium. Again noted are mildly increased interstitial markings throughout both lungs, not significantly changed since the prior exam. There is also stable right basilar small pleural effusion. No acute osseous abnormality. IMPRESSION: Unchanged mildly increased interstitial markings throughout both lungs which could be due to chronic lung changes or mild interstitial edema. Trace right pleural effusion. Electronically Signed   By: Prudencio Pair M.D.   On: 04/19/2019 22:48   ECHOCARDIOGRAM COMPLETE  Result Date: 04/21/2019    ECHOCARDIOGRAM REPORT   Patient Name:   BRYANT LIPPS Date of Exam: 04/21/2019 Medical Rec #:  [id number]      Height:       61.0 in Accession #:    [id number]     Weight:       188.3 lb Date of Birth:  11/11/1945      BSA:          1.841 m Patient Age:    56 years       BP:           128/82 mmHg Patient Gender: F              HR:           108 bpm. Exam Location:  Inpatient Procedure: 2D Echo, Cardiac Doppler and Color Doppler Indications:    Stroke 434.91 / I163.9  History:        Patient has prior history of Echocardiogram examinations, most                 recent 09/16/2018. TIA and COPD; Risk Factors:Diabetes. Thyroid                 disease.  Sonographer:    Jonelle Sidle Dance Referring Phys: 91 KAREN M BLACK  Sonographer Comments: Technically difficult study due to poor echo windows and suboptimal parasternal window. IMPRESSIONS  1. Left ventricular ejection fraction, by estimation, is 60 to 65%. The left ventricle has normal function. Left ventricular endocardial border not optimally defined to evaluate regional wall motion. Indeterminate diastolic filling due to E-A fusion.  2. Right ventricular systolic function is normal. The right ventricular size is normal. Tricuspid regurgitation signal is inadequate for assessing PA pressure.  3. The mitral valve is degenerative. No evidence of mitral valve regurgitation. No evidence of mitral  stenosis.  4. The aortic valve is grossly normal. Aortic valve regurgitation is not visualized. No aortic stenosis is present.  5. The inferior vena cava is normal in size with greater than 50% respiratory variability, suggesting right atrial pressure of 3 mmHg. Conclusion(s)/Recommendation(s): Very poor windows for TTE. Globally normal LV function but unable to exclude RWMA. FINDINGS  Left Ventricle: Left ventricular ejection fraction, by estimation, is 60 to 65%. The left ventricle has normal function. Left ventricular endocardial border not  optimally defined to evaluate regional wall motion. The left ventricular internal cavity size was normal in size. There is no left ventricular hypertrophy. Indeterminate diastolic filling due to E-A fusion. Right Ventricle: The right ventricular size is normal. No increase in right ventricular wall thickness. Right ventricular systolic function is normal. Tricuspid regurgitation signal is inadequate for assessing PA pressure. Left Atrium: Left atrial size was normal in size. Right Atrium: Right atrial size was normal in size. Pericardium: A small pericardial effusion is present. The pericardial effusion is anterior to the right ventricle. Mitral Valve: The mitral valve is degenerative in appearance. There is mild thickening of the mitral valve leaflet(s). There is mild calcification of the mitral valve leaflet(s). Mild to moderate mitral annular calcification. No evidence of mitral valve regurgitation. No evidence of mitral valve stenosis. Tricuspid Valve: The tricuspid valve is grossly normal. Tricuspid valve regurgitation is trivial. No evidence of tricuspid stenosis. Aortic Valve: The aortic valve is grossly normal. Aortic valve regurgitation is not visualized. No aortic stenosis is present. Pulmonic Valve: The pulmonic valve was grossly normal. Pulmonic valve regurgitation is not visualized. Aorta: The aortic root is normal in size and structure. Venous: The inferior vena  cava is normal in size with greater than 50% respiratory variability, suggesting right atrial pressure of 3 mmHg. IAS/Shunts: The atrial septum is grossly normal.  LEFT VENTRICLE PLAX 2D LVIDd:         4.00 cm LVIDs:         3.40 cm LV PW:         1.00 cm LV IVS:        1.10 cm LVOT diam:     1.90 cm LV SV:         36 LV SV Index:   20 LVOT Area:     2.84 cm  RIGHT VENTRICLE            IVC RV Basal diam:  2.30 cm    IVC diam: 1.55 cm RV S prime:     9.90 cm/s TAPSE (M-mode): 1.3 cm LEFT ATRIUM             Index       RIGHT ATRIUM          Index LA diam:        3.50 cm 1.90 cm/m  RA Area:     8.22 cm LA Vol (A2C):   40.2 ml 21.84 ml/m RA Volume:   14.50 ml 7.88 ml/m LA Vol (A4C):   41.7 ml 22.65 ml/m LA Biplane Vol: 42.7 ml 23.20 ml/m  AORTIC VALVE LVOT Vmax:   78.70 cm/s LVOT Vmean:  47.650 cm/s LVOT VTI:    0.127 m  AORTA Ao Root diam: 2.70 cm MITRAL VALVE MV Area (PHT): 5.46 cm     SHUNTS MV Decel Time: 139 msec     Systemic VTI:  0.13 m MV E velocity: 106.00 cm/s  Systemic Diam: 1.90 cm Eleonore Chiquito MD Electronically signed by Eleonore Chiquito MD Signature Date/Time: 04/21/2019/12:31:36 PM    Final         Scheduled Meds: .  stroke: mapping our early stages of recovery book   Does not apply Once  . aspirin EC  81 mg Oral Daily  . Chlorhexidine Gluconate Cloth  6 each Topical Q0600  . doxercalciferol  3 mcg Intravenous Q T,Th,Sa-HD  . FLUoxetine  10 mg Oral Daily  . heparin  5,000 Units Subcutaneous Q8H  . insulin aspart  0-6 Units  Subcutaneous TID WC  . levothyroxine  112 mcg Oral Q0600  . [START ON 04/22/2019] midodrine  10 mg Oral Q T,Th,Sa-HD  . rosuvastatin  10 mg Oral q1800  . sodium chloride flush  3 mL Intravenous Q12H  . sodium chloride flush  3 mL Intravenous Q12H   Continuous Infusions: . sodium chloride    . sodium chloride    . sodium chloride    . sodium chloride 10 mL/hr at 04/20/19 1759  . dextrose 5 % and 0.9% NaCl 50 mL/hr at 04/21/19 1234     LOS: 1 day         Hosie Poisson, MD [facility]   To contact the attending provider between 7A-7P or the covering provider during after hours 7P-7A, please log into the web site [email or web address] and access using universal [place] password for that web site. If you do not have the password, please call the hospital operator.  04/21/2019, 3:06 PM

## 2019-04-21 NOTE — Progress Notes (Signed)
MD aware of patient RR and SpO2.

## 2019-04-21 NOTE — TOC Initial Note (Signed)
Transition of Care [facility]) - Initial/Assessment Note    Patient Details  Name: Joan Mosley MRN: [id number] Date of Birth: 1945/11/16  Transition of Care [facility]) CM/SW Contact:    Geralynn Ochs, LCSW Phone Number: 04/21/2019, 3:14 PM  Clinical Narrative:    CSW familiar with patient from previous hospitalizations. CSW alerted by MD that patient's family is interested in SNF this time, as there is a lack of family support at the home at this time. CSW spoke with patient's sons [name] and [name] to discuss SNF placement, and both are in agreement. Patient had recently been at [place] [facility], but has not been at [facility] before. Family unfamiliar with choices, but would like her close by and hopefully at a SNF that they can visit, as she did not do well at [facility] when family could not see her. CSW faxed out referral and will provide CMS list with bed offers received.               Expected Discharge Plan: Skilled Nursing Facility Barriers to Discharge: Continued Medical Work up   Patient Goals and CMS Choice Patient states their goals for this hospitalization and ongoing recovery are:: patient unable to state goals due to disorientation CMS Medicare.gov Compare Post Acute Care list provided to:: Patient Represenative (must comment) Choice offered to / list presented to : [place] / Guardian  Expected Discharge Plan and Services Expected Discharge Plan: [place] Choice: [place] arrangements for the past 2 months: Single Family Home                                      Prior Living Arrangements/Services Living arrangements for the past 2 months: Single Family Home Lives with:: Adult Children Patient language and need for interpreter reviewed:: No Do you feel safe going back to the place where you live?: Yes      Need for Family Participation in Patient Care: Yes (Comment) Care giver support system in place?: No  (comment) Current home services: DME, Home PT Criminal Activity/Legal Involvement Pertinent to Current Situation/Hospitalization: No - Comment as needed  Activities of Daily Living   ADL Screening (condition at time of admission) Is the patient deaf or have difficulty hearing?: Yes Does the patient have difficulty seeing, even when wearing glasses/contacts?: No Does the patient have difficulty concentrating, remembering, or making decisions?: Yes Does the patient have difficulty dressing or bathing?: Yes Does the patient have difficulty walking or climbing stairs?: Yes  Permission Sought/Granted Permission sought to share information with : Facility Sport and exercise psychologist, Family Supports Permission granted to share information with : Yes, Verbal Permission Granted  Share Information with NAME: Rosalie Doctor  Permission granted to share info w AGENCY: SNF  Permission granted to share info w Relationship: Children     Emotional Assessment Appearance:: Appears stated age Attitude/Demeanor/Rapport: Unable to Assess Affect (typically observed): Unable to Assess Orientation: : Oriented to Self, Oriented to Place Alcohol / Substance Use: Not Applicable Psych Involvement: No (comment)  Admission diagnosis:  Delirium [R41.0] Stroke [facility]) [I63.9] Patient Active Problem List   Diagnosis Date Noted  . Acute encephalopathy 04/20/2019  . Stroke ([place]) 04/20/2019  . Dysphagia 03/29/2019  . Pneumonia due to COVID-19 virus 02/23/2019  . Aspiration into airway   . COVID-19 02/10/2019  . Pneumonia due to COVID-19 virus 02/09/2019  .  Hypotension 12/24/2018  . Aspiration pneumonia of right lower lobe ([place])   . Palliative care encounter   . COPD (chronic obstructive pulmonary disease) ([place]) 11/11/2018  . TIA (transient ischemic attack)   . HLD (hyperlipidemia)   . Type II diabetes mellitus with renal manifestations ([place])   . Depression with anxiety   . Advanced care planning/counseling  discussion   . Goals of care, counseling/discussion   . Palliative care by specialist   . Pulmonary edema 09/08/2018  . Respiratory failure ([place]) 09/08/2018  . Weakness   . SDH (subdural hematoma) ([place]) 09/06/2018  . Chronic respiratory failure with hypoxia, on home O2 therapy ([place]) 09/06/2018  . Subdural hematoma ([place]) 08/22/2018  . Elevated troponin 08/22/2018  . ESRD (end stage renal disease) ([place]) 08/22/2018  . Anxiety 07/04/2018  . Counseling regarding advanced directives and goals of care 07/01/2018  . Full code status 07/01/2018  . Drug-induced constipation 07/01/2018  . Hemodialysis-associated hypotension 07/01/2018  . Dialysis patient ([place]) 07/01/2018  . HOH (hard of hearing) 07/01/2018  . Stage 5 chronic kidney disease on chronic dialysis ([place]) 07/01/2018  . Type 2 diabetes mellitus with diabetic polyneuropathy, with long-term current use of insulin ([place]) 07/01/2018  . Hypothyroidism 07/01/2018  . Gait instability 07/01/2018  . Hyperkalemia 04/20/2018  . Cerebral infarction, unspecified ([place]) 04/05/2018  . Chronic diastolic (congestive) heart failure ([place]) 04/05/2018  . Diarrhea, unspecified 04/05/2018  . Pain, unspecified 04/05/2018  . Pruritus, unspecified 04/05/2018  . Shortness of breath 04/05/2018  . Coagulation defect, unspecified ([place]) 03/31/2018  . Anemia in chronic kidney disease 03/25/2018  . Iron deficiency anemia, unspecified 03/25/2018  . Secondary hyperparathyroidism of renal origin ([place]) 03/25/2018   PCP:  Flossie Buffy, NP Pharmacy:   [facility] ([facility]), [place] [address] [place] [address] Phone: [phone] Fax: [phone]     Social Determinants of Health (SDOH) Interventions    Readmission Risk Interventions Readmission Risk Prevention Plan 10/04/2018  Transportation Screening Complete  Medication Review ([place]) Complete  PCP or Specialist appointment within 3-5  days of discharge Complete  HRI or [place] Complete  SW Recovery Care/Counseling Consult Complete  Palliative Care Screening Complete  [place] Patient Refused

## 2019-04-21 NOTE — Evaluation (Addendum)
Clinical/Bedside Swallow Evaluation Patient Details  Name: Joan Mosley MRN: [id number] Date of Birth: [birth year]  Today's Date: 04/21/2019 Time: SLP Start Time (ACUTE ONLY): 60 SLP Stop Time (ACUTE ONLY): 1105 SLP Time Calculation (min) (ACUTE ONLY): 15 min  Past Medical History:  Past Medical History:  Diagnosis Date  . Arthritis   . COPD (chronic obstructive pulmonary disease) ([place])   . Diabetes mellitus without complication ([place])   . Diabetic neuropathy ([place])   . 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 Renal disorder    ESRD  . Thyroid disease   . TIA (transient ischemic attack)    Past Surgical History:  Past Surgical History:  Procedure Laterality Date  . ABDOMINAL HYSTERECTOMY    . IR FLUORO GUIDE CV LINE RIGHT  11/14/2018  . RECTOCELE REPAIR    . REPLACEMENT TOTAL KNEE BILATERAL Bilateral 2008  . THYROIDECTOMY     80%  . VAGINAL WOUND CLOSURE / REPAIR     HPI:  Joan Mosley is an 74 y.o. female past medical history that includes COPD with chronic hypoxic respiratory failure, end-stage renal disease on dialysis Tuesday Thursday Saturday, hypothyroidism, insulin-dependent diabetes, chronic diastolic CHF, history of CVA, recent admission with acute on chronic respiratory failure secondary to Covid presented to the emergency department March 24 chief complaint increased confusion and headache.  Initial work-up in the emergency department included an MRI without contrast of the brain revealing an acute or subacute right temporal infarct. Prior admission notes include the following- 1/23 CT chest>>"no PE, near complete collapse of both the right middle and right lower lobes with extensive endobronchial debris. Consolidation involving the right upper lobe concerning for pneumonia or aspiration pneumonia." Speech consulted for swallowing given concerns for aspiration.  Pt is known to SLP services; has had swallow assessments during admissions in October and November.  MBS  on 11/30 revealed mild pharyngeal dysphagia with frank penetration of thin liquids to the vocal folds; chin tuck assisted in airway protection.  F/U MBS recommended during [place] admission, but pt could not tolerate assessment, Discharged on Dys 3/nectar.    Assessment / Plan / Recommendation Clinical Impression  Pt demonstrates adequate tolerance of thin liquids and puree. She is alert and upright, able to follow commands with loud volume speech as she is hard of hearing. She cannot respond to questions about prior diet of thickened liquids. However, given dramatic improvement in respiratory function since last admission with COVID and pts excellent tolerance of consecutive sips today, Recommend initiating a mechanical soft diet and thin liquids. Will f/u for need to implement further strategies, such as a chin tuck, which was helpful in reducing penetration on last MBS.   Addendum: MD communicated to RN that she wanted the pt to continue nectar thick liquids and order was changed. Recommend f/u for further attempts to resume thin liquids as prolonged use of thickened liquids is contraindicated in advanced age, particularly when pts dysphagia identified on last MBS was relatively mild and observed during a time of acute illness. No signs of aspiration observed on clinical exam today, so instrumental assessment may be warranted for diet advancement.  SLP Visit Diagnosis: Dysphagia, unspecified (R13.10)    Aspiration Risk  Mild aspiration risk    Diet Recommendation Dysphagia 3 (Mech soft);Thin liquid   Liquid Administration via: Cup;Straw Medication Administration: Whole meds with liquid Supervision: Patient able to self feed Compensations: Slow rate;Small sips/bites    Other  Recommendations     Follow up Recommendations Skilled  Nursing facility      Frequency and Duration min 2x/week  2 weeks       Prognosis        Swallow Study   General HPI: Joan Mosley is an 74 y.o. female  past medical history that includes COPD with chronic hypoxic respiratory failure, end-stage renal disease on dialysis Tuesday Thursday Saturday, hypothyroidism, insulin-dependent diabetes, chronic diastolic CHF, history of CVA, recent admission with acute on chronic respiratory failure secondary to Covid presented to the emergency department March 24 chief complaint increased confusion and headache.  Initial work-up in the emergency department included an MRI without contrast of the brain revealing an acute or subacute right temporal infarct. Prior admission notes include the following- 1/23 CT chest>>"no PE, near complete collapse of both the right middle and right lower lobes with extensive endobronchial debris. Consolidation involving the right upper lobe concerning for pneumonia or aspiration pneumonia." Speech consulted for swallowing given concerns for aspiration.  Pt is known to SLP services; has had swallow assessments during admissions in October and November.  MBS on 11/30 revealed mild pharyngeal dysphagia with frank penetration of thin liquids to the vocal folds; chin tuck assisted in airway protection.  F/U MBS recommended during [place] admission, but pt could not tolerate assessment, Discharged on Dys 3/nectar.  Type of Study: Bedside Swallow Evaluation Diet Prior to this Study: NPO Temperature Spikes Noted: No Respiratory Status: Room air History of Recent Intubation: No Behavior/Cognition: Alert;Cooperative;Pleasant mood Oral Cavity Assessment: Within Functional Limits Oral Care Completed by SLP: No Oral Cavity - Dentition: Missing dentition Vision: Functional for self-feeding Self-Feeding Abilities: Able to feed self Patient Positioning: Upright in bed Baseline Vocal Quality: Normal Volitional Cough: Strong Volitional Swallow: Able to elicit    Oral/Motor/Sensory Function Overall Oral Motor/Sensory Function: Within functional limits   Ice Chips     Thin Liquid Thin Liquid: Within  functional limits Presentation: Cup;Straw    Nectar Thick Nectar Thick Liquid: Not tested   Honey Thick Honey Thick Liquid: Not tested   Puree Puree: Within functional limits   Solid     Solid: Impaired Oral Phase Impairments: Impaired mastication     Herbie [place], MA CCC-SLP  Acute Rehabilitation Services Pager [phone] Office [phone]  Lynann Beaver 04/21/2019,1:26 PM

## 2019-04-21 NOTE — Progress Notes (Signed)
Admit: 04/19/2019 LOS: 1  51F ESRD with AMS, small ischemic CVA, progressive decline  Subjective:  . HD yesterday, no UF . Awake, pleasant, very confused this morning; oriented x 0 . Having stroke work-up, echocardiogram in progress . K 4.2, Ca 9.6  03/25 0701 - 03/26 0700 In: 9.9 [I.V.:9.9] Out: -40   Filed Weights   04/20/19 1250 04/20/19 1642 04/20/19 1737  Weight: 87.1 kg 87.1 kg 85.4 kg    Scheduled Meds: .  stroke: mapping our early stages of recovery book   Does not apply Once  . Chlorhexidine Gluconate Cloth  6 each Topical Q0600  . doxercalciferol  3 mcg Intravenous Q T,Th,Sa-HD  . FLUoxetine  10 mg Oral Daily  . heparin  5,000 Units Subcutaneous Q8H  . insulin aspart  0-6 Units Subcutaneous TID WC  . levothyroxine  112 mcg Oral Q0600  . [START ON 04/22/2019] midodrine  10 mg Oral Q T,Th,Sa-HD  . rosuvastatin  10 mg Oral q1800  . sodium chloride flush  3 mL Intravenous Q12H  . sodium chloride flush  3 mL Intravenous Q12H   Continuous Infusions: . sodium chloride    . sodium chloride    . sodium chloride    . sodium chloride 10 mL/hr at 04/20/19 1759   PRN Meds:.sodium chloride, sodium chloride, sodium chloride, acetaminophen **OR** acetaminophen, alteplase, heparin, ipratropium-albuterol, lidocaine (PF), lidocaine-prilocaine, ondansetron **OR** ondansetron (ZOFRAN) IV, pentafluoroprop-tetrafluoroeth, senna-docusate, sevelamer carbonate, sodium chloride flush  Current Labs: reviewed    Physical Exam:  Blood pressure 116/75, pulse 100, temperature 98.4 F (36.9 C), temperature source Oral, resp. rate 18, height 5\' 1"  (1.549 m), weight 85.4 kg, SpO2 95 %. Chronically ill-appearing, NAD Regular, normal S1 and S2 Clear breath sounds bilaterally Right IJ TDC 1+ lower extremity edema A and O x0   Assessment/Plan: 1. new acute/subacute infarct - neuro following; acute cva w/u in process 2. ESRD -  TTS on schedule: 3K 4h 1L UF TDC, no heparin, midodrine support,  400/800 3. Hypertension/volume  - midodrine for BP support - 10 mg pre and 10 mg mid for SBP < 95 - 4. Anemia  - hgb 11.1 - stable - no ESA for now 5. Metabolic bone disease -  Continue Hectorol - resume binders when taking pos 6. Nutrition - NPO, per TRH 7. GOC - need to revisit given dementia, recurrent CVA, poor functional status requires [facility] transportation to HD; rec palliative consult 8. DM/hypothyroidism- per primary 9. COPD - high O2 dependency 10. Anxiety/depression  Pearson Grippe MD 04/21/2019, 10:05 AM  Recent Labs  Lab 04/19/19 2226 04/20/19 0558 04/21/19 0410  NA 141 140 140  K 4.0 3.8 4.2  CL 96* 97* 98  CO2 27 28 25   GLUCOSE 136* 129* 113*  BUN 37* 38* 11  CREATININE 7.69* 7.61* 3.65*  CALCIUM 9.6 9.6 9.6   Recent Labs  Lab 04/15/19 2250 04/19/19 2226 04/20/19 0558  WBC 14.9* 9.8 9.7  NEUTROABS 12.5* 7.1 7.2  HGB 11.0* 11.0* 11.1*  HCT 37.5 37.4 37.5  MCV 103.3* 101.4* 102.2*  PLT 177 181 191

## 2019-04-21 NOTE — Evaluation (Signed)
Occupational Therapy Evaluation Patient Details Name: Joan Mosley MRN: [id number] DOB: [birth year] Today's Date: 04/21/2019    History of Present Illness Pt is a 74 y/o female admitted secondary to confusion and headache. Per notes, pt has also become close to bed bound secondary to progressive weakness. PMH includes DM, ESRD on HD, COPD, dCHF, and recent COVID infection. MRI: Small acute to subacute right temporal infarct.   Clinical Impression   This 74 yo female admitted with above presents to acute OT with unknown PLOF other than chart states possibly bed bound due to weakness. Pt alert and following some commands (hindered a lot by extreme HOH) than she did on PT eval yesterday. She was able to wash her face once washcloth presented and she did better when loud verbal cues were paired with gestural cues.No family for PLOF so unsure how close her current mobility deficits are to her baseline. We will continue to follow with primary recommendation SNF unless family can provide basically total care for her.     Follow Up Recommendations  SNF;Supervision/Assistance - 24 hour(unless she has 24/7 at home that can do close to total care for her)    [facility] bed;Wheelchair (measurements OT);Wheelchair cushion (measurements OT);Other (comment)(hoyer lift (not sure what she has at home))       Precautions / Restrictions Precautions Precautions: Fall Restrictions Weight Bearing Restrictions: No      Mobility Bed Mobility Overal bed mobility: Needs Assistance Bed Mobility: Rolling Rolling: Max assist         General bed mobility comments: Could get her to try and follow commands for rolling with gestures added due to [facility]         ADL either performed or assessed with clinical judgement   ADL Overall ADL's : Needs assistance/impaired Eating/Feeding: Moderate assistance;Bed level   Grooming: Wash/dry face;Supervision/safety;Set up;Bed level   Upper  Body Bathing: Maximal assistance;Bed level   Lower Body Bathing: Total assistance;Bed level   Upper Body Dressing : Total assistance;Bed level   Lower Body Dressing: Total assistance;Bed level                       Vision Baseline Vision/History: No visual deficits Additional Comments: Extremely HOH, could not get her to follow commands for testing, eyes looking in all directions, but did not really want to turn her head to the left            Pertinent Vitals/Pain Pain Assessment: Faces Faces Pain Scale: Hurts little more Pain Location: right and left knee with flexion Pain Descriptors / Indicators: Grimacing;Moaning Pain Intervention(s): Limited activity within patient's tolerance;Monitored during session;Repositioned     Hand Dominance Right   Extremity/Trunk Assessment Upper Extremity Assessment Upper Extremity Assessment: Generalized weakness(grossly 3/5 supine in bed)           Communication Communication Communication: HOH   Cognition Arousal/Alertness: Awake/alert Behavior During Therapy: Flat affect Overall Cognitive Status: No family/caregiver present to determine baseline cognitive functioning                                 General Comments: Pt extremely [place] expects to be discharged to:: Private residence  Additional Comments: Unsure of home information, as no family present and pt very hard of hearing      Prior Functioning/Environment          Comments: Unsure of PLOF as pt unable to respond to questions due to extremely HOH. Per notes, pt possibly bed bound? so would anticipate pt needing increased support.        OT Problem List: Decreased strength;Decreased range of motion;Impaired balance (sitting and/or standing);Pain;Decreased cognition(? impaired vision)      OT Treatment/Interventions: Self-care/ADL training;DME and/or AE  instruction;Patient/family education;Balance training;Therapeutic activities;Therapeutic exercise    OT Goals(Current goals can be found in the care plan section) Acute Rehab OT Goals Patient Stated Goal: to go home OT Goal Formulation: With patient Time For Goal Achievement: 05/05/19 Potential to Achieve Goals: Fair  OT Frequency: Min 2X/week              AM-PAC OT "6 Clicks" Daily Activity     Outcome Measure Help from another person eating meals?: A Lot Help from another person taking care of personal grooming?: A Lot Help from another person toileting, which includes using toliet, bedpan, or urinal?: Total Help from another person bathing (including washing, rinsing, drying)?: Total Help from another person to put on and taking off regular upper body clothing?: Total Help from another person to put on and taking off regular lower body clothing?: Total 6 Click Score: 8   End of Session    Activity Tolerance: Patient tolerated treatment well Patient left: in bed;with bed alarm set(in chair position)  OT Visit Diagnosis: Other abnormalities of gait and mobility (R26.89);Muscle weakness (generalized) (M62.81);Pain Pain - Right/Left: (both) Pain - part of body: Knee(with flexion)                Time: 1111-1130 OT Time Calculation (min): 19 min Charges:  OT General Charges $OT Visit: 1 Visit OT Evaluation $OT Eval Moderate Complexity: 1 Mod  Cathy  OTR/L Acute [employer] Pager [phone] Office [phone]     04/21/2019, 11:47 AM

## 2019-04-21 NOTE — NC FL2 (Signed)
[place] LEVEL OF CARE SCREENING TOOL     IDENTIFICATION  Patient Name: Lessie Funderburke Birthdate: 1945/07/16 Sex: female Admission Date (Current Location): 04/19/2019  [facility] [place] Number:  Herbalist and Address:  [facility], [place] [address] [place], [place] [address]      Provider Number: [id number]  Attending Physician Name and Address:  Hosie Poisson, MD  Relative Name and Phone Number:       Current Level of Care: Hospital Recommended Level of Care: [place] Prior Approval Number:    Date Approved/Denied:   PASRR Number: [id number]  Discharge Plan: SNF    Current Diagnoses: Patient Active Problem List   Diagnosis Date Noted  . Acute encephalopathy 04/20/2019  . Stroke ([place]) 04/20/2019  . Dysphagia 03/29/2019  . Pneumonia due to COVID-19 virus 02/23/2019  . Aspiration into airway   . COVID-19 02/10/2019  . Pneumonia due to COVID-19 virus 02/09/2019  . Hypotension 12/24/2018  . Aspiration pneumonia of right lower lobe ([place])   . Palliative care encounter   . COPD (chronic obstructive pulmonary disease) ([place]) 11/11/2018  . TIA (transient ischemic attack)   . HLD (hyperlipidemia)   . Type II diabetes mellitus with renal manifestations ([place])   . Depression with anxiety   . Advanced care planning/counseling discussion   . Goals of care, counseling/discussion   . Palliative care by specialist   . Pulmonary edema 09/08/2018  . Respiratory failure ([place]) 09/08/2018  . Weakness   . SDH (subdural hematoma) ([place]) 09/06/2018  . Chronic respiratory failure with hypoxia, on home O2 therapy ([place]) 09/06/2018  . Subdural hematoma ([place]) 08/22/2018  . Elevated troponin 08/22/2018  . ESRD (end stage renal disease) ([place]) 08/22/2018  . Anxiety 07/04/2018  . Counseling regarding advanced directives and goals of care 07/01/2018  . Full code status 07/01/2018  . Drug-induced constipation 07/01/2018  .  Hemodialysis-associated hypotension 07/01/2018  . Dialysis patient ([place]) 07/01/2018  . HOH (hard of hearing) 07/01/2018  . Stage 5 chronic kidney disease on chronic dialysis ([place]) 07/01/2018  . Type 2 diabetes mellitus with diabetic polyneuropathy, with long-term current use of insulin ([place]) 07/01/2018  . Hypothyroidism 07/01/2018  . Gait instability 07/01/2018  . Hyperkalemia 04/20/2018  . Cerebral infarction, unspecified ([place]) 04/05/2018  . Chronic diastolic (congestive) heart failure ([place]) 04/05/2018  . Diarrhea, unspecified 04/05/2018  . Pain, unspecified 04/05/2018  . Pruritus, unspecified 04/05/2018  . Shortness of breath 04/05/2018  . Coagulation defect, unspecified ([place]) 03/31/2018  . Anemia in chronic kidney disease 03/25/2018  . Iron deficiency anemia, unspecified 03/25/2018  . Secondary hyperparathyroidism of renal origin ([place]) 03/25/2018    Orientation RESPIRATION BLADDER Height & Weight     Self, Place  O2, Other (Comment)([place] 4L; bipap at night) Incontinent Weight: 188 lb 4.4 oz (85.4 kg) Height:  5\' 1"  (154.9 cm)  BEHAVIORAL SYMPTOMS/MOOD NEUROLOGICAL BOWEL NUTRITION STATUS      Incontinent Diet(see DC summary)  AMBULATORY STATUS COMMUNICATION OF NEEDS Skin   Extensive Assist Verbally Normal                       Personal Care Assistance Level of Assistance  Bathing, Feeding, Dressing Bathing Assistance: Maximum assistance Feeding assistance: Limited assistance Dressing Assistance: Maximum assistance     Functional Limitations Info             SPECIAL CARE FACTORS FREQUENCY  PT (By licensed PT), OT (By licensed OT), Speech therapy  PT Frequency: 5x/wk OT Frequency: 5x/wk     Speech Therapy Frequency: 5x/wk      Contractures Contractures Info: Not present    Additional Factors Info  Code Status, Allergies, Psychotropic, Insulin Sliding Scale Code Status Info: Full Allergies Info: Avelox (Moxifloxacin Hcl In Nacl), Codeine, Penicillins,  Sulfa Antibiotics, Dextromethorphan-guaifenesin, Shellfish Allergy Psychotropic Info: Prozac 10mg  daily Insulin Sliding Scale Info: 0-6 units 3x/day with meals       Current Medications (04/21/2019):  This is the current hospital active medication list Current Facility-Administered Medications  Medication Dose Route Frequency Provider Last Rate Last Admin  .  stroke: mapping our early stages of recovery book   Does not apply Once Black, Karen M, NP      . 0.9 %  sodium chloride infusion  250 mL Intravenous PRN Opyd, Ilene Qua, MD      . 0.9 %  sodium chloride infusion  100 mL Intravenous PRN Alric Seton, PA-C      . 0.9 %  sodium chloride infusion  100 mL Intravenous PRN Alric Seton, PA-C      . 0.9 %  sodium chloride infusion   Intravenous Continuous Radene Gunning, NP 10 mL/hr at 04/20/19 1759 New Bag at 04/20/19 1759  . acetaminophen (TYLENOL) tablet 650 mg  650 mg Oral Q6H PRN Opyd, Ilene Qua, MD       Or  . acetaminophen (TYLENOL) suppository 650 mg  650 mg Rectal Q6H PRN Opyd, Ilene Qua, MD      . alteplase (CATHFLO ACTIVASE) injection 2 mg  2 mg Intracatheter Once PRN Alric Seton, PA-C      . aspirin EC tablet 81 mg  81 mg Oral Daily Hosie Poisson, MD      . Chlorhexidine Gluconate Cloth 2 % PADS 6 each  6 each Topical Q0600 Alric Seton, PA-C   6 each at 04/21/19 0547  . dextrose 5 %-0.9 % sodium chloride infusion   Intravenous Continuous Hosie Poisson, MD 50 mL/hr at 04/21/19 1234 New Bag at 04/21/19 1234  . doxercalciferol (HECTOROL) injection 3 mcg  3 mcg Intravenous Q T,Th,Sa-HD Alric Seton, PA-C   3 mcg at 04/20/19 1653  . FLUoxetine (PROZAC) capsule 10 mg  10 mg Oral Daily Eulogio Bear U, DO   Stopped at 04/20/19 1006  . heparin injection 1,000 Units  1,000 Units Dialysis PRN Alric Seton, PA-C      . heparin injection 5,000 Units  5,000 Units Subcutaneous Q8H Opyd, Ilene Qua, MD   5,000 Units at 04/21/19 0543  . insulin aspart (novoLOG) injection 0-6  Units  0-6 Units Subcutaneous TID WC Opyd, Ilene Qua, MD      . ipratropium-albuterol (DUONEB) 0.5-2.5 (3) MG/3ML nebulizer solution 3 mL  3 mL Nebulization Q4H PRN Opyd, Ilene Qua, MD      . levothyroxine (SYNTHROID) tablet 112 mcg  112 mcg Oral Q0600 Eliseo Squires, Jessica U, DO      . lidocaine (PF) (XYLOCAINE) 1 % injection 5 mL  5 mL Intradermal PRN Alric Seton, PA-C      . lidocaine-prilocaine (EMLA) cream 1 application  1 application Topical PRN Alric Seton, PA-C      . [START ON 04/22/2019] midodrine (PROAMATINE) tablet 10 mg  10 mg Oral Q T,Th,Sa-HD Opyd, Ilene Qua, MD      . ondansetron (ZOFRAN) tablet 4 mg  4 mg Oral Q6H PRN Opyd, Ilene Qua, MD       Or  . ondansetron (ZOFRAN) injection 4 mg  4 mg Intravenous Q6H PRN Opyd, Ilene Qua, MD      . pentafluoroprop-tetrafluoroeth (GEBAUERS) aerosol 1 application  1 application Topical PRN Alric Seton, PA-C      . rosuvastatin (CRESTOR) tablet 10 mg  10 mg Oral q1800 Opyd, Ilene Qua, MD      . senna-docusate (Senokot-S) tablet 1 tablet  1 tablet Oral QHS PRN Black, Lezlie Octave, NP      . sevelamer carbonate (RENVELA) tablet 800 mg  800 mg Oral PRN Opyd, Ilene Qua, MD      . sodium chloride flush (NS) 0.9 % injection 3 mL  3 mL Intravenous Q12H Opyd, Ilene Qua, MD   3 mL at 04/20/19 0943  . sodium chloride flush (NS) 0.9 % injection 3 mL  3 mL Intravenous Q12H Opyd, Ilene Qua, MD   3 mL at 04/21/19 0924  . sodium chloride flush (NS) 0.9 % injection 3 mL  3 mL Intravenous PRN Opyd, Ilene Qua, MD         Discharge Medications: Please see discharge summary for a list of discharge medications.  Relevant Imaging Results:  Relevant Lab Results:   Additional Information SS#: [id number]; HD at [facility], set with PTAR for transport to HD  Geralynn Ochs, LCSW

## 2019-04-21 NOTE — Progress Notes (Signed)
  Echocardiogram 2D Echocardiogram has been performed.  Dannika Hilgeman G Stylianos Stradling 04/21/2019, 10:09 AM

## 2019-04-22 ENCOUNTER — Other Ambulatory Visit: Payer: Self-pay

## 2019-04-22 ENCOUNTER — Inpatient Hospital Stay (HOSPITAL_COMMUNITY): Payer: Medicare Other

## 2019-04-22 LAB — CBC
HCT: 33.5 % — ABNORMAL LOW (ref 36.0–46.0)
Hemoglobin: 10.1 g/dL — ABNORMAL LOW (ref 12.0–15.0)
MCH: 30.7 pg (ref 26.0–34.0)
MCHC: 30.1 g/dL (ref 30.0–36.0)
MCV: 101.8 fL — ABNORMAL HIGH (ref 80.0–100.0)
Platelets: 163 10*3/uL (ref 150–400)
RBC: 3.29 MIL/uL — ABNORMAL LOW (ref 3.87–5.11)
RDW: 16.8 % — ABNORMAL HIGH (ref 11.5–15.5)
WBC: 12.1 10*3/uL — ABNORMAL HIGH (ref 4.0–10.5)
nRBC: 0 % (ref 0.0–0.2)

## 2019-04-22 LAB — BASIC METABOLIC PANEL
Anion gap: 14 (ref 5–15)
BUN: 19 mg/dL (ref 8–23)
CO2: 25 mmol/L (ref 22–32)
Calcium: 9.8 mg/dL (ref 8.9–10.3)
Chloride: 100 mmol/L (ref 98–111)
Creatinine, Ser: 4.98 mg/dL — ABNORMAL HIGH (ref 0.44–1.00)
GFR calc Af Amer: 9 mL/min — ABNORMAL LOW (ref >60)
GFR calc non Af Amer: 8 mL/min — ABNORMAL LOW (ref >60)
Glucose, Bld: 125 mg/dL — ABNORMAL HIGH (ref 70–99)
Potassium: 4 mmol/L (ref 3.5–5.1)
Sodium: 139 mmol/L (ref 135–145)

## 2019-04-22 LAB — GLUCOSE, CAPILLARY
Glucose-Capillary: 117 mg/dL — ABNORMAL HIGH (ref 70–99)
Glucose-Capillary: 99 mg/dL (ref 70–99)
Glucose-Capillary: 104 mg/dL — ABNORMAL HIGH (ref 70–99)
Glucose-Capillary: 152 mg/dL — ABNORMAL HIGH (ref 70–99)

## 2019-04-22 MED ORDER — HEPARIN SODIUM (PORCINE) 1000 UNIT/ML IJ SOLN
INTRAMUSCULAR | Status: AC
  Filled 2019-04-22: qty 4

## 2019-04-22 MED ORDER — DOXERCALCIFEROL 4 MCG/2ML IV SOLN
INTRAVENOUS | Status: AC
  Administered 2019-04-22: 3 ug via INTRAVENOUS
  Filled 2019-04-22: qty 2

## 2019-04-22 MED ORDER — ATORVASTATIN CALCIUM 40 MG PO TABS
40.0000 mg | ORAL_TABLET | Freq: Every day | ORAL | Status: DC
  Administered 2019-04-22 – 2019-04-25 (×4): 40 mg via ORAL
  Filled 2019-04-22 (×4): qty 1

## 2019-04-22 MED ORDER — MIDODRINE HCL 5 MG PO TABS
ORAL_TABLET | ORAL | Status: AC
  Filled 2019-04-22: qty 2

## 2019-04-22 MED ORDER — ROSUVASTATIN CALCIUM 20 MG PO TABS: 20.0000 mg | ORAL_TABLET | Freq: Every day | ORAL | Status: DC

## 2019-04-22 NOTE — Progress Notes (Signed)
PROGRESS NOTE    Joan Mosley  PQD:[id number] DOB: [birth year] DOA: 04/19/2019 PCP: Flossie Buffy, NP   Brief Narrative:  74 year old with prior h/o COPD, type 2 DM, ESRD on HD, chronic diastolic heart failure, insulin dependent DM, hypothyroidism, recently admitted in January for acute respiratory failure secondary to COVID-19 now presents to ED for altered mental status.  She was found to be afebrile on 3 to 4 L of nasal cannula oxygen.  Initial CT of the head did not show any acute stroke.  She was admitted for evaluation of stroke.  MRI of the brain shows Small acute to subacute right temporal infarct. Moderate to advanced chronic microvascular ischemic changes.  MRA of the head and neck showsMild atheromatous irregularity about the carotid bifurcations without hemodynamically significant stenosis.  Pt seen and examined at bedside. Denies any new complaints.    Assessment & Plan:   Principal Problem:   Stroke [facility]) Active Problems:   Type 2 diabetes mellitus with diabetic polyneuropathy, with long-term current use of insulin (HCC)   Hypothyroidism   ESRD (end stage renal disease) (HCC)   Chronic respiratory failure with hypoxia, on home O2 therapy (HCC)   Depression with anxiety   COPD (chronic obstructive pulmonary disease) (HCC)   Acute encephalopathy   Acute right temporal infarct:  Admitted for evaluation of stroke.    Initial CT of the head did not show any acute stroke.  She was admitted for evaluation of stroke.  MRI of the brain shows Small acute to subacute right temporal infarct. Moderate to advanced chronic microvascular ischemic changes.  MRA of the head and neck showsMild atheromatous irregularity about the carotid bifurcations without hemodynamically significant stenosis. Echocardiogram ordered , pending.  Aspirin 81 mg daily.  Increase Crestor to 20 mg daily Neurology on board Therapy evaluations recommending SNF.  TOC aware. Discussed the above with the  patient's son.  Outpatient follow-up with neurology in about 4 to 6 weeks.    Hypothyroidism:  Resume synthroid.    End-stage renal disease on dialysis Nephrology on board and appreciate recommendations. Hd earlier this am.     COPD Patient is on BiPAP at night at home continue the same. Diminished air entry , no wheezing heard.    Depression with anxiety Continue with home medications.   Chronic respiratory failure with hypoxia requiring between 4 to 5 lit of nasal cannula oxygen tachypneic today. CXR ordered for further evaluation.    DVT prophylaxis: Heparin.  Code Status: Full code.  Family Communication:  Discussed with son  Disposition Plan:  . Patient came from: HOME.            Marland Kitchen Anticipated d/c place: SNF . Barriers to d/c OR conditions which need to be met to effect a safe d/c: pending work up for stroke.    Consultants:   Neurology  Nephrology.    Procedures: MRI brain without contrast    Antimicrobials: None.    Subjective: Patient appears more tachypneic today after dialysis, no chest pain or shortness of breath.  Objective: Vitals:   04/22/19 1146 04/22/19 1200 04/22/19 1300 04/22/19 1500  BP: (!) 93/59     Pulse: (!) 110 (!) 113 (!) 106 99  Resp: 20 (!) 25 (!) 25 (!) 24  Temp: 98.6 F (37 C)     TempSrc: Oral     SpO2: 99% 95% 93% 94%  Weight:      Height:        Intake/Output Summary (Last  24 hours) at 04/22/2019 1728 Last data filed at 04/22/2019 1111 Gross per 24 hour  Intake 120 ml  Output 2200 ml  Net -2080 ml   Filed Weights   04/20/19 1737 04/22/19 0652 04/22/19 1111  Weight: 85.4 kg 85.8 kg 83.4 kg    Examination:  General exam: Uncomfortable on 5 L of nasal cannula oxygen. Respiratory system: Diminished air entry at bases, no wheezing or rhonchi. Cardiovascular system: S1 and S2 heard, regular rate rhythm, no JVD, no pedal edema Gastrointestinal system: Abdomen is soft, nontender nondistended bowel sounds  normal Central nervous system: Alert but confused, able to follow commands and moving all extremities..  Extremities: No cyanosis or clubbing Skin: No ulcers seen  psychiatry: Flat affect    Data Reviewed: I have personally reviewed following labs and imaging studies  CBC: Recent Labs  Lab 04/15/19 2250 04/19/19 2226 04/20/19 0558 04/22/19 0453  WBC 14.9* 9.8 9.7 12.1*  NEUTROABS 12.5* 7.1 7.2  --   HGB 11.0* 11.0* 11.1* 10.1*  HCT 37.5 37.4 37.5 33.5*  MCV 103.3* 101.4* 102.2* 101.8*  PLT 177 181 191 563   Basic Metabolic Panel: Recent Labs  Lab 04/15/19 2250 04/19/19 2226 04/20/19 0558 04/21/19 0410 04/22/19 0453  NA 137 141 140 140 139  K 4.1 4.0 3.8 4.2 4.0  CL 94* 96* 97* 98 100  CO2 27 27 28 25 25   GLUCOSE 249* 136* 129* 113* 125*  BUN 10 37* 38* 11 19  CREATININE 2.39* 7.69* 7.61* 3.65* 4.98*  CALCIUM 9.2 9.6 9.6 9.6 9.8   GFR: Estimated Creatinine Clearance: 9.6 mL/min (A) (by C-G formula based on SCr of 4.98 mg/dL (H)). Liver Function Tests: Recent Labs  Lab 04/15/19 2250 04/19/19 2226 04/20/19 0558  AST 21 47* 48*  ALT 16 15 15   ALKPHOS 110 107 108  BILITOT 1.0 1.1 0.9  PROT 6.5 6.2* 6.2*  ALBUMIN 2.8* 2.7* 2.6*   No results for input(s): LIPASE, AMYLASE in the last 168 hours. Recent Labs  Lab 04/21/19 1323  AMMONIA 35   Coagulation Profile: Recent Labs  Lab 04/15/19 2250  INR 1.0   Cardiac Enzymes: No results for input(s): CKTOTAL, CKMB, CKMBINDEX, TROPONINI in the last 168 hours. BNP (last 3 results) Recent Labs    04/10/19 1418  PROBNP 81.0   HbA1C: Recent Labs    04/20/19 0558  HGBA1C 5.5   CBG: Recent Labs  Lab 04/21/19 1658 04/21/19 2103 04/22/19 0558 04/22/19 1144 04/22/19 1638  GLUCAP 148* 129* 117* 99 104*   Lipid Profile: Recent Labs    04/20/19 0558  CHOL 192  HDL 53  LDLCALC 90  TRIG 244*  CHOLHDL 3.6   Thyroid Function Tests: Recent Labs    04/21/19 1323  TSH 7.728*   Anemia Panel: No  results for input(s): VITAMINB12, FOLATE, FERRITIN, TIBC, IRON, RETICCTPCT in the last 72 hours. Sepsis Labs: No results for input(s): PROCALCITON, LATICACIDVEN in the last 168 hours.  Recent Results (from the past 240 hour(s))  SARS CORONAVIRUS 2 (TAT 6-24 HRS) Nasopharyngeal Nasopharyngeal Swab     Status: None   Collection Time: 04/15/19 10:55 PM   Specimen: Nasopharyngeal Swab  Result Value Ref Range Status   SARS Coronavirus 2 NEGATIVE NEGATIVE Final    Comment: (NOTE) SARS-CoV-2 target nucleic acids are NOT DETECTED. The SARS-CoV-2 RNA is generally detectable in upper and lower respiratory specimens during the acute phase of infection. Negative results do not preclude SARS-CoV-2 infection, do not rule out co-infections with other  pathogens, and should not be used as the sole basis for treatment or other patient management decisions. Negative results must be combined with clinical observations, patient history, and epidemiological information. The expected result is Negative. Fact Sheet for Patients: SugarRoll.be Fact Sheet for Healthcare Providers: https://www.woods-mathews.com/ This test is not yet approved or cleared by the [place] FDA and  has been authorized for detection and/or diagnosis of SARS-CoV-2 by FDA under an Emergency Use Authorization (EUA). This EUA will remain  in effect (meaning this test can be used) for the duration of the COVID-19 declaration under Section 56 4(b)(1) of the Act, 21 U.S.C. section 360bbb-3(b)(1), unless the authorization is terminated or revoked sooner. Performed at [facility], [place] [address] [place], [place] [address]          Radiology Studies: MR ANGIO NECK WO CONTRAST  Result Date: 04/21/2019 CLINICAL DATA:  Follow-up examination for acute stroke. EXAM: MRA NECK WITHOUT  CONTRAST TECHNIQUE: Multiplanar and multiecho pulse sequences of the neck were obtained without  intravenous contrast. Angiographic images of the neck were obtained using MRA technique without and with intravenous contrast. COMPARISON:  Prior MRI from 04/20/2019. FINDINGS: AORTIC ARCH: Examination somewhat technically limited by lack of IV contrast. Aortic arch and origin of the great vessels not assessed on this examination. Partially visualized subclavian arteries are widely patent. RIGHT CAROTID SYSTEM: Right common carotid artery patent from its origin to the bifurcation without stenosis. Mild atheromatous irregularity about the right bifurcation without hemodynamically significant stenosis. Right ICA widely patent to the skull base without stenosis, occlusion, or obvious dissection. LEFT CAROTID SYSTEM: Visualized left common carotid artery widely patent to the bifurcation. Mild atheromatous irregularity about the left bifurcation without definite flow-limiting stenosis. Left ICA patent distally to the skull base without stenosis, occlusion, or definite dissection. VERTEBRAL ARTERIES: Origin of the left vertebral artery not seen. Visualized vertebral arteries widely patent within the neck with antegrade flow, with no appreciable stenosis or other abnormality. Vertebral arteries are largely codominant. IMPRESSION: 1. Mild atheromatous irregularity about the carotid bifurcations without hemodynamically significant stenosis. Otherwise wide patency of both carotid artery systems within the neck. 2. Wide patency of both vertebral arteries within the neck. Electronically Signed   By: Jeannine Boga M.D.   On: 04/21/2019 01:05   DG CHEST PORT 1 VIEW  Result Date: 04/22/2019 CLINICAL DATA:  Cough EXAM: PORTABLE CHEST 1 VIEW COMPARISON:  04/19/2019 FINDINGS: Single frontal view of the chest demonstrates stable right internal jugular dialysis catheter. Cardiac silhouette is unchanged. Continued ectasia and atherosclerosis of the thoracic aorta. Diffuse interstitial prominence unchanged since prior exam.  Decreased right pleural effusion. No pneumothorax. IMPRESSION: 1. Diffuse interstitial prominence consistent with chronic interstitial lung disease. 2. Decreased right pleural effusion. Electronically Signed   By: Randa Ngo M.D.   On: 04/22/2019 15:01   ECHOCARDIOGRAM COMPLETE  Result Date: 04/21/2019    ECHOCARDIOGRAM REPORT   Patient Name:   Joan Mosley Date of Exam: 04/21/2019 Medical Rec #:  [id number]      Height:       61.0 in Accession #:    [id number]     Weight:       188.3 lb Date of Birth:  08/28/1945      BSA:          1.841 m Patient Age:    49 years       BP:           128/82 mmHg Patient Gender: F  HR:           108 bpm. Exam Location:  Inpatient Procedure: 2D Echo, Cardiac Doppler and Color Doppler Indications:    Stroke 434.91 / I163.9  History:        Patient has prior history of Echocardiogram examinations, most                 recent 09/16/2018. TIA and COPD; Risk Factors:Diabetes. Thyroid                 disease.  Sonographer:    Jonelle Sidle Dance Referring Phys: [id number] KAREN M BLACK  Sonographer Comments: Technically difficult study due to poor echo windows and suboptimal parasternal window. IMPRESSIONS  1. Left ventricular ejection fraction, by estimation, is 60 to 65%. The left ventricle has normal function. Left ventricular endocardial border not optimally defined to evaluate regional wall motion. Indeterminate diastolic filling due to E-A fusion.  2. Right ventricular systolic function is normal. The right ventricular size is normal. Tricuspid regurgitation signal is inadequate for assessing PA pressure.  3. The mitral valve is degenerative. No evidence of mitral valve regurgitation. No evidence of mitral stenosis.  4. The aortic valve is grossly normal. Aortic valve regurgitation is not visualized. No aortic stenosis is present.  5. The inferior vena cava is normal in size with greater than 50% respiratory variability, suggesting right atrial pressure of 3 mmHg.  Conclusion(s)/Recommendation(s): Very poor windows for TTE. Globally normal LV function but unable to exclude RWMA. FINDINGS  Left Ventricle: Left ventricular ejection fraction, by estimation, is 60 to 65%. The left ventricle has normal function. Left ventricular endocardial border not optimally defined to evaluate regional wall motion. The left ventricular internal cavity size was normal in size. There is no left ventricular hypertrophy. Indeterminate diastolic filling due to E-A fusion. Right Ventricle: The right ventricular size is normal. No increase in right ventricular wall thickness. Right ventricular systolic function is normal. Tricuspid regurgitation signal is inadequate for assessing PA pressure. Left Atrium: Left atrial size was normal in size. Right Atrium: Right atrial size was normal in size. Pericardium: A small pericardial effusion is present. The pericardial effusion is anterior to the right ventricle. Mitral Valve: The mitral valve is degenerative in appearance. There is mild thickening of the mitral valve leaflet(s). There is mild calcification of the mitral valve leaflet(s). Mild to moderate mitral annular calcification. No evidence of mitral valve regurgitation. No evidence of mitral valve stenosis. Tricuspid Valve: The tricuspid valve is grossly normal. Tricuspid valve regurgitation is trivial. No evidence of tricuspid stenosis. Aortic Valve: The aortic valve is grossly normal. Aortic valve regurgitation is not visualized. No aortic stenosis is present. Pulmonic Valve: The pulmonic valve was grossly normal. Pulmonic valve regurgitation is not visualized. Aorta: The aortic root is normal in size and structure. Venous: The inferior vena cava is normal in size with greater than 50% respiratory variability, suggesting right atrial pressure of 3 mmHg. IAS/Shunts: The atrial septum is grossly normal.  LEFT VENTRICLE PLAX 2D LVIDd:         4.00 cm LVIDs:         3.40 cm LV PW:         1.00 cm LV IVS:         1.10 cm LVOT diam:     1.90 cm LV SV:         36 LV SV Index:   20 LVOT Area:     2.84 cm  RIGHT VENTRICLE  IVC RV Basal diam:  2.30 cm    IVC diam: 1.55 cm RV S prime:     9.90 cm/s TAPSE (M-mode): 1.3 cm LEFT ATRIUM             Index       RIGHT ATRIUM          Index LA diam:        3.50 cm 1.90 cm/m  RA Area:     8.22 cm LA Vol (A2C):   40.2 ml 21.84 ml/m RA Volume:   14.50 ml 7.88 ml/m LA Vol (A4C):   41.7 ml 22.65 ml/m LA Biplane Vol: 42.7 ml 23.20 ml/m  AORTIC VALVE LVOT Vmax:   78.70 cm/s LVOT Vmean:  47.650 cm/s LVOT VTI:    0.127 m  AORTA Ao Root diam: 2.70 cm MITRAL VALVE MV Area (PHT): 5.46 cm     SHUNTS MV Decel Time: 139 msec     Systemic VTI:  0.13 m MV E velocity: 106.00 cm/s  Systemic Diam: 1.90 cm Eleonore Chiquito MD Electronically signed by Eleonore Chiquito MD Signature Date/Time: 04/21/2019/12:31:36 PM    Final         Scheduled Meds: .  stroke: mapping our early stages of recovery book   Does not apply Once  . aspirin EC  81 mg Oral Daily  . Chlorhexidine Gluconate Cloth  6 each Topical Q0600  . doxercalciferol  3 mcg Intravenous Q T,Th,Sa-HD  . FLUoxetine  10 mg Oral Daily  . heparin      . heparin  5,000 Units Subcutaneous Q8H  . insulin aspart  0-6 Units Subcutaneous TID WC  . levothyroxine  112 mcg Oral Q0600  . midodrine  10 mg Oral Q T,Th,Sa-HD  . rosuvastatin  10 mg Oral q1800  . sodium chloride flush  3 mL Intravenous Q12H  . sodium chloride flush  3 mL Intravenous Q12H   Continuous Infusions: . sodium chloride    . sodium chloride 10 mL/hr at 04/20/19 1759     LOS: 2 days        Hosie Poisson, MD [facility]   To contact the attending provider between 7A-7P or the covering provider during after hours 7P-7A, please log into the web site [email or web address] and access using universal [place] password for that web site. If you do not have the password, please call the hospital operator.  04/22/2019, 5:28 PM

## 2019-04-22 NOTE — Procedures (Signed)
I was present at this dialysis session. I have reviewed the session itself and made appropriate changes.   NEURO notes reviewed.  Plan is for SNF.    AAO to self, location, year today, much improved  TDC. 3K bath. 2L UF goal. Going well.    Filed Weights   04/20/19 1250 04/20/19 1642 04/20/19 1737  Weight: 87.1 kg 87.1 kg 85.4 kg    Recent Labs  Lab 04/22/19 0453  NA 139  K 4.0  CL 100  CO2 25  GLUCOSE 125*  BUN 19  CREATININE 4.98*  CALCIUM 9.8    Recent Labs  Lab 04/15/19 2250 04/15/19 2250 04/19/19 2226 04/20/19 0558 04/22/19 0453  WBC 14.9*   < > 9.8 9.7 12.1*  NEUTROABS 12.5*  --  7.1 7.2  --   HGB 11.0*   < > 11.0* 11.1* 10.1*  HCT 37.5   < > 37.4 37.5 33.5*  MCV 103.3*   < > 101.4* 102.2* 101.8*  PLT 177   < > 181 191 163   < > = values in this interval not displayed.    Scheduled Meds: .  stroke: mapping our early stages of recovery book   Does not apply Once  . aspirin EC  81 mg Oral Daily  . Chlorhexidine Gluconate Cloth  6 each Topical Q0600  . doxercalciferol  3 mcg Intravenous Q T,Th,Sa-HD  . FLUoxetine  10 mg Oral Daily  . heparin  5,000 Units Subcutaneous Q8H  . insulin aspart  0-6 Units Subcutaneous TID WC  . levothyroxine  112 mcg Oral Q0600  . midodrine  10 mg Oral Q T,Th,Sa-HD  . rosuvastatin  10 mg Oral q1800  . sodium chloride flush  3 mL Intravenous Q12H  . sodium chloride flush  3 mL Intravenous Q12H   Continuous Infusions: . sodium chloride    . sodium chloride    . sodium chloride    . sodium chloride 10 mL/hr at 04/20/19 1759   PRN Meds:.sodium chloride, sodium chloride, sodium chloride, acetaminophen **OR** acetaminophen, alteplase, heparin, ipratropium-albuterol, lidocaine (PF), lidocaine-prilocaine, ondansetron **OR** ondansetron (ZOFRAN) IV, pentafluoroprop-tetrafluoroeth, senna-docusate, sevelamer carbonate, sodium chloride flush   Pearson Grippe  MD 04/22/2019, 7:20 AM

## 2019-04-22 NOTE — Progress Notes (Signed)
Patient placed on [place] - PPV with 4 liters of oxygen.  Saturation 99% and HR 99.

## 2019-04-23 LAB — GLUCOSE, CAPILLARY
Glucose-Capillary: 134 mg/dL — ABNORMAL HIGH (ref 70–99)
Glucose-Capillary: 129 mg/dL — ABNORMAL HIGH (ref 70–99)
Glucose-Capillary: 141 mg/dL — ABNORMAL HIGH (ref 70–99)
Glucose-Capillary: 143 mg/dL — ABNORMAL HIGH (ref 70–99)

## 2019-04-23 NOTE — Progress Notes (Signed)
Admit: 04/19/2019 LOS: 3  61F ESRD with AMS, small ischemic CVA, progressive decline  Subjective:  . HD yesterday,   2 L ultrafiltration; post weight 83.4kg . No acute events  03/27 0701 - 03/28 0700 In: -  Out: 2000   Filed Weights   04/20/19 1737 04/22/19 0652 04/22/19 1111  Weight: 85.4 kg 85.8 kg 83.4 kg    Scheduled Meds: .  stroke: mapping our early stages of recovery book   Does not apply Once  . aspirin EC  81 mg Oral Daily  . atorvastatin  40 mg Oral q1800  . Chlorhexidine Gluconate Cloth  6 each Topical Q0600  . doxercalciferol  3 mcg Intravenous Q T,Th,Sa-HD  . FLUoxetine  10 mg Oral Daily  . heparin  5,000 Units Subcutaneous Q8H  . insulin aspart  0-6 Units Subcutaneous TID WC  . levothyroxine  112 mcg Oral Q0600  . midodrine  10 mg Oral Q T,Th,Sa-HD  . sodium chloride flush  3 mL Intravenous Q12H  . sodium chloride flush  3 mL Intravenous Q12H   Continuous Infusions: . sodium chloride    . sodium chloride 10 mL/hr at 04/20/19 1759   PRN Meds:.sodium chloride, acetaminophen **OR** acetaminophen, ipratropium-albuterol, ondansetron **OR** ondansetron (ZOFRAN) IV, senna-docusate, sevelamer carbonate, sodium chloride flush  Current Labs: reviewed    Physical Exam:  Blood pressure 109/79, pulse 98, temperature 98.5 F (36.9 C), temperature source Axillary, resp. rate 16, height 5\' 1"  (1.549 m), weight 83.4 kg, SpO2 98 %. Chronically ill-appearing, NAD, awake alert and conversant Regular, normal S1 and S2 Clear breath sounds bilaterally Right IJ TDC 1+ lower extremity edema    Assessment/Plan: 1. new acute/subacute infarct - per primary and neuro, med mgmt 2. ESRD -  TTS on schedule: Next treatment 3/30 3. Hypertension/volume  - midodrine for BP support - 10 mg pre and 10 mg mid for SBP < 95 - 4. Anemia  - hgb 10.1 - stable - no ESA for now 5. Metabolic bone disease -  Continue Hectorol - resume binders when consistently taking pos 6. Nutrition -plus  protein 7. GOC - need to revisit given dementia, recurrent CVA, poor functional status requires PTAR transportation to HD; rec palliative consult 8. DM/hypothyroidism-dysphagia 3 diet 9. COPD -O2 dependent 10. Anxiety/depression 11. FTT/debility: For SNF at discharge  Pearson Grippe MD 04/23/2019, 8:35 AM  Recent Labs  Lab 04/20/19 0558 04/21/19 0410 04/22/19 0453  NA 140 140 139  K 3.8 4.2 4.0  CL 97* 98 100  CO2 28 25 25   GLUCOSE 129* 113* 125*  BUN 38* 11 19  CREATININE 7.61* 3.65* 4.98*  CALCIUM 9.6 9.6 9.8   Recent Labs  Lab 04/19/19 2226 04/20/19 0558 04/22/19 0453  WBC 9.8 9.7 12.1*  NEUTROABS 7.1 7.2  --   HGB 11.0* 11.1* 10.1*  HCT 37.4 37.5 33.5*  MCV 101.4* 102.2* 101.8*  PLT 181 191 163

## 2019-04-23 NOTE — Progress Notes (Signed)
PROGRESS NOTE    Joan Mosley  IOX:[id number] DOB: Nov 22, 1945 DOA: 04/19/2019 PCP: Flossie Buffy, NP   Brief Narrative:  74 year old with prior h/o COPD, type 2 DM, ESRD on HD, chronic diastolic heart failure, insulin dependent DM, hypothyroidism, recently admitted in January for acute respiratory failure secondary to COVID-19 now presents to ED for altered mental status.  She was found to be afebrile on 3 to 4 L of nasal cannula oxygen.  Initial CT of the head did not show any acute stroke.  She was admitted for evaluation of stroke.  MRI of the brain shows Small acute to subacute right temporal infarct. Moderate to advanced chronic microvascular ischemic changes.  MRA of the head and neck showsMild atheromatous irregularity about the carotid bifurcations without hemodynamically significant stenosis. Patient had some increased work of breathing yesterday and CXR ordered, showed Diffuse interstitial prominence consistent with chronic interstitial lung disease. Decreased right pleural effusion. Pt seen and examined at bedside, she denies any new complaints. In view of her dementia, new and recurrent CVA, poor functional status, general deconditioning, poor progression, as per nephrology recommendations, we will request palliative care consult to re visit goals of care.      Assessment & Plan:   Principal Problem:   Stroke [facility]) Active Problems:   Type 2 diabetes mellitus with diabetic polyneuropathy, with long-term current use of insulin (HCC)   Hypothyroidism   ESRD (end stage renal disease) (HCC)   Chronic respiratory failure with hypoxia, on home O2 therapy (HCC)   Depression with anxiety   COPD (chronic obstructive pulmonary disease) (HCC)   Acute encephalopathy   Acute right temporal infarct:  Admitted for evaluation of stroke.    Initial CT of the head did not show any acute stroke.  She was admitted for evaluation of stroke.  MRI of the brain shows Small acute to subacute  right temporal infarct. Moderate to advanced chronic microvascular ischemic changes.  MRA of the head and neck showsMild atheromatous irregularity about the carotid bifurcations without hemodynamically significant stenosis. Echocardiogram showed Left ventricular ejection fraction, by estimation, is 60 to 65%. The  left ventricle has normal function. Left ventricular endocardial border  not optimally defined to evaluate regional wall motion. Indeterminate  diastolic filling due to E-A fusion.  continue with Aspirin 81 mg daily.  Increase Crestor to 20 mg daily Neurology signed off.  Therapy evaluations recommending SNF.  TOC aware. Discussed the above with the patient's son.  Outpatient follow-up with neurology in about 4 to 6 weeks.    Hypothyroidism:  Continue with synthroid.     End-stage renal disease on dialysis Nephrology on board and appreciate recommendations.   COPD Patient is on BiPAP at night at home continue the same. No wheezing heard, resume  oxygen to keep sats greater than 90% .   Depression with anxiety Continue with home medications.   Chronic respiratory failure with hypoxia requiring between 4 to 5 lit of nasal cannula oxygen tachypneic today. CXR shows chronic interstitial lung disease. No focal air space disease.   DVT prophylaxis: Heparin.  Code Status: Full code.  Family Communication:  Discussed with son  Disposition Plan:  . Patient came from: HOME.            Marland Kitchen Anticipated d/c place: SNF . Barriers to d/c OR conditions which need to be met to effect a safe d/c: TOC on board, waiting for placement, she is stable for discharge to SNF.    Consultants:  Neurology  Nephrology.   Palliative care consult.    Procedures: MRI brain without contrast    Antimicrobials: None.    Subjective:  No chest pain, no sob, no nausea or vomiting.  Appears comfortable.   Objective: Vitals:   04/23/19 0100 04/23/19 0354 04/23/19 0749 04/23/19 0821   BP:  108/71 109/79   Pulse:  99 98   Resp: 18 15 16    Temp:  99 F (37.2 C) 98.5 F (36.9 C)   TempSrc:  Axillary Axillary   SpO2:  99% 98% 98%  Weight:      Height:        Intake/Output Summary (Last 24 hours) at 04/23/2019 1217 Last data filed at 04/23/2019 0900 Gross per 24 hour  Intake 120 ml  Output -  Net 120 ml   Filed Weights   04/20/19 1737 04/22/19 0652 04/22/19 1111  Weight: 85.4 kg 85.8 kg 83.4 kg    Examination:  General exam: comfortable, not in distress.  Respiratory system: decreased air entry at bases, no wheezing or rhonchi.  Cardiovascular system:S1S2 heard, RRR, no JVD, no pedal edema.  Gastrointestinal system: Abdomen is soft, NT ND BS+ Central nervous system:alert, confused, following simple commands.  Extremities: no pedal edema, cyanosis or clubbing.  Skin: no ulcers seen.   psychiatry: Flat affect     Data Reviewed: I have personally reviewed following labs and imaging studies  CBC: Recent Labs  Lab 04/19/19 2226 04/20/19 0558 04/22/19 0453  WBC 9.8 9.7 12.1*  NEUTROABS 7.1 7.2  --   HGB 11.0* 11.1* 10.1*  HCT 37.4 37.5 33.5*  MCV 101.4* 102.2* 101.8*  PLT 181 191 389   Basic Metabolic Panel: Recent Labs  Lab 04/19/19 2226 04/20/19 0558 04/21/19 0410 04/22/19 0453  NA 141 140 140 139  K 4.0 3.8 4.2 4.0  CL 96* 97* 98 100  CO2 27 28 25 25   GLUCOSE 136* 129* 113* 125*  BUN 37* 38* 11 19  CREATININE 7.69* 7.61* 3.65* 4.98*  CALCIUM 9.6 9.6 9.6 9.8   GFR: Estimated Creatinine Clearance: 9.6 mL/min (A) (by C-G formula based on SCr of 4.98 mg/dL (H)). Liver Function Tests: Recent Labs  Lab 04/19/19 2226 04/20/19 0558  AST 47* 48*  ALT 15 15  ALKPHOS 107 108  BILITOT 1.1 0.9  PROT 6.2* 6.2*  ALBUMIN 2.7* 2.6*   No results for input(s): LIPASE, AMYLASE in the last 168 hours. Recent Labs  Lab 04/21/19 1323  AMMONIA 35   Coagulation Profile: No results for input(s): INR, PROTIME in the last 168 hours. Cardiac  Enzymes: No results for input(s): CKTOTAL, CKMB, CKMBINDEX, TROPONINI in the last 168 hours. BNP (last 3 results) Recent Labs    04/10/19 1418  PROBNP 81.0   HbA1C: No results for input(s): HGBA1C in the last 72 hours. CBG: Recent Labs  Lab 04/22/19 0558 04/22/19 1144 04/22/19 1638 04/22/19 2124 04/23/19 0620  GLUCAP 117* 99 104* 152* 134*   Lipid Profile: No results for input(s): CHOL, HDL, LDLCALC, TRIG, CHOLHDL, LDLDIRECT in the last 72 hours. Thyroid Function Tests: Recent Labs    04/21/19 1323  TSH 7.728*   Anemia Panel: No results for input(s): VITAMINB12, FOLATE, FERRITIN, TIBC, IRON, RETICCTPCT in the last 72 hours. Sepsis Labs: No results for input(s): PROCALCITON, LATICACIDVEN in the last 168 hours.  Recent Results (from the past 240 hour(s))  SARS CORONAVIRUS 2 (TAT 6-24 HRS) Nasopharyngeal Nasopharyngeal Swab     Status: None   Collection Time: 04/15/19 10:55  PM   Specimen: Nasopharyngeal Swab  Result Value Ref Range Status   SARS Coronavirus 2 NEGATIVE NEGATIVE Final    Comment: (NOTE) SARS-CoV-2 target nucleic acids are NOT DETECTED. The SARS-CoV-2 RNA is generally detectable in upper and lower respiratory specimens during the acute phase of infection. Negative results do not preclude SARS-CoV-2 infection, do not rule out co-infections with other pathogens, and should not be used as the sole basis for treatment or other patient management decisions. Negative results must be combined with clinical observations, patient history, and epidemiological information. The expected result is Negative. Fact Sheet for Patients: SugarRoll.be Fact Sheet for Healthcare Providers: https://www.woods-mathews.com/ This test is not yet approved or cleared by the [place] FDA and  has been authorized for detection and/or diagnosis of SARS-CoV-2 by FDA under an Emergency Use Authorization (EUA). This EUA will remain  in  effect (meaning this test can be used) for the duration of the COVID-19 declaration under Section 56 4(b)(1) of the Act, 21 U.S.C. section 360bbb-3(b)(1), unless the authorization is terminated or revoked sooner. Performed at [facility], [place] [address], [place], [place] [address]          Radiology Studies: DG CHEST PORT 1 VIEW  Result Date: 04/22/2019 CLINICAL DATA:  Cough EXAM: PORTABLE CHEST 1 VIEW COMPARISON:  04/19/2019 FINDINGS: Single frontal view of the chest demonstrates stable right internal jugular dialysis catheter. Cardiac silhouette is unchanged. Continued ectasia and atherosclerosis of the thoracic aorta. Diffuse interstitial prominence unchanged since prior exam. Decreased right pleural effusion. No pneumothorax. IMPRESSION: 1. Diffuse interstitial prominence consistent with chronic interstitial lung disease. 2. Decreased right pleural effusion. Electronically Signed   By: Randa Ngo M.D.   On: 04/22/2019 15:01        Scheduled Meds: .  stroke: mapping our early stages of recovery book   Does not apply Once  . aspirin EC  81 mg Oral Daily  . atorvastatin  40 mg Oral q1800  . Chlorhexidine Gluconate Cloth  6 each Topical Q0600  . doxercalciferol  3 mcg Intravenous Q T,Th,Sa-HD  . FLUoxetine  10 mg Oral Daily  . heparin  5,000 Units Subcutaneous Q8H  . insulin aspart  0-6 Units Subcutaneous TID WC  . levothyroxine  112 mcg Oral Q0600  . midodrine  10 mg Oral Q T,Th,Sa-HD  . sodium chloride flush  3 mL Intravenous Q12H  . sodium chloride flush  3 mL Intravenous Q12H   Continuous Infusions: . sodium chloride    . sodium chloride 10 mL/hr at 04/20/19 1759     LOS: 3 days        Hosie Poisson, MD [facility]   To contact the attending provider between 7A-7P or the covering provider during after hours 7P-7A, please log into the web site [email or web address] and access using universal [place] password for that web site. If you do not have  the password, please call the hospital operator.  04/23/2019, 12:17 PM

## 2019-04-23 NOTE — Progress Notes (Signed)
Called patient's son to update him on his mom.

## 2019-04-23 NOTE — TOC Progression Note (Signed)
Transition of Care [facility]) - Progression Note    Patient Details  Name: Joan Mosley MRN: [id number] Date of Birth: December 28, 1945  Transition of Care [facility]) CM/SW [place], [place] Phone Number: 04/23/2019, 1:16 PM  Clinical Narrative:    CSW made telephone call to patient's daughter Joan Mosley. CSW provided patient's daughter with the current bed offers of [place] and [facility]. Patient's daughter expressed she would like to discuss it with her older brother. CSW will continue to follow and assist with discharge planning needs.   Expected Discharge Plan: [place] Barriers to Discharge: Continued Medical Work up  Expected Discharge Plan and Services Expected Discharge Plan: [place]: [place] arrangements for the past 2 months: Single Family Home                                       Social Determinants of Health (SDOH) Interventions    Readmission Risk Interventions Readmission Risk Prevention Plan 10/04/2018  Transportation Screening Complete  Medication Review [employer]) Complete  PCP or Specialist appointment within 3-5 days of discharge Complete  HRI or [place] Complete  SW Recovery Care/Counseling Consult Complete  Palliative Care Screening Complete  [place] Patient Refused

## 2019-04-23 NOTE — Progress Notes (Signed)
P_t placed on cpap with 4lt bleedin FIO2. tol well

## 2019-04-24 ENCOUNTER — Inpatient Hospital Stay (HOSPITAL_COMMUNITY): Payer: Medicare Other

## 2019-04-24 DIAGNOSIS — Z7189 Other specified counseling: Secondary | ICD-10-CM

## 2019-04-24 DIAGNOSIS — R627 Adult failure to thrive: Secondary | ICD-10-CM

## 2019-04-24 DIAGNOSIS — Z515 Encounter for palliative care: Secondary | ICD-10-CM

## 2019-04-24 LAB — RENAL FUNCTION PANEL
Albumin: 2.4 g/dL — ABNORMAL LOW (ref 3.5–5.0)
Anion gap: 15 (ref 5–15)
BUN: 21 mg/dL (ref 8–23)
CO2: 25 mmol/L (ref 22–32)
Calcium: 10 mg/dL (ref 8.9–10.3)
Chloride: 97 mmol/L — ABNORMAL LOW (ref 98–111)
Creatinine, Ser: 5.22 mg/dL — ABNORMAL HIGH (ref 0.44–1.00)
GFR calc Af Amer: 9 mL/min — ABNORMAL LOW (ref >60)
GFR calc non Af Amer: 7 mL/min — ABNORMAL LOW (ref >60)
Glucose, Bld: 157 mg/dL — ABNORMAL HIGH (ref 70–99)
Phosphorus: 3.7 mg/dL (ref 2.5–4.6)
Potassium: 3.8 mmol/L (ref 3.5–5.1)
Sodium: 137 mmol/L (ref 135–145)

## 2019-04-24 LAB — RESPIRATORY PANEL BY RT PCR (FLU A&B, COVID)
Influenza A by PCR: NEGATIVE
Influenza B by PCR: NEGATIVE
SARS Coronavirus 2 by RT PCR: NEGATIVE

## 2019-04-24 LAB — GLUCOSE, CAPILLARY
Glucose-Capillary: 114 mg/dL — ABNORMAL HIGH (ref 70–99)
Glucose-Capillary: 118 mg/dL — ABNORMAL HIGH (ref 70–99)
Glucose-Capillary: 130 mg/dL — ABNORMAL HIGH (ref 70–99)
Glucose-Capillary: 104 mg/dL — ABNORMAL HIGH (ref 70–99)

## 2019-04-24 MED ORDER — DOXERCALCIFEROL 4 MCG/2ML IV SOLN: 3.0000 ug | INTRAVENOUS | Status: AC

## 2019-04-24 MED ORDER — ATORVASTATIN CALCIUM 40 MG PO TABS: 40.0000 mg | ORAL_TABLET | Freq: Every day | ORAL | 0 refills | Status: DC

## 2019-04-24 MED ORDER — CLONAZEPAM 0.5 MG PO TABS: ORAL_TABLET | ORAL | 0 refills | Status: DC

## 2019-04-24 MED ORDER — ASPIRIN 81 MG PO TBEC: 81.0000 mg | DELAYED_RELEASE_TABLET | Freq: Every day | ORAL | 1 refills | Status: DC

## 2019-04-24 MED ORDER — CHLORHEXIDINE GLUCONATE CLOTH 2 % EX PADS
6.0000 | MEDICATED_PAD | Freq: Every day | CUTANEOUS | Status: DC
  Administered 2019-04-25 – 2019-04-26 (×2): 6 via TOPICAL

## 2019-04-24 NOTE — Progress Notes (Signed)
Nephrology Progress Note:   89F ESRD with AMS, small ischemic CVA, progressive decline  Subjective:  Had HD on 3/27 with 2 kg UF.  post weight 83.4kg.  Spoke with nursing and pt on 4-5 liters oxygen at home.  Pt was not able to tell me this herself when I asked.  Has been on 5 liters oxygen here and no acute distress per nursing.  Pt states no AVF - uses a right chest tunn catheter.  Review of systems:  Denies shortness of breath or chest pain Denies nausea or vomiting  03/28 0701 - 03/29 0700 In: 360 [P.O.:360] Out: -   Filed Weights   04/20/19 1737 04/22/19 0652 04/22/19 1111  Weight: 85.4 kg 85.8 kg 83.4 kg    Scheduled Meds: .  stroke: mapping our early stages of recovery book   Does not apply Once  . aspirin EC  81 mg Oral Daily  . atorvastatin  40 mg Oral q1800  . Chlorhexidine Gluconate Cloth  6 each Topical Q0600  . doxercalciferol  3 mcg Intravenous Q T,Th,Sa-HD  . FLUoxetine  10 mg Oral Daily  . heparin  5,000 Units Subcutaneous Q8H  . insulin aspart  0-6 Units Subcutaneous TID WC  . levothyroxine  112 mcg Oral Q0600  . midodrine  10 mg Oral Q T,Th,Sa-HD  . sodium chloride flush  3 mL Intravenous Q12H  . sodium chloride flush  3 mL Intravenous Q12H   Continuous Infusions: . sodium chloride    . sodium chloride 10 mL/hr at 04/20/19 1759   PRN Meds:.sodium chloride, acetaminophen **OR** acetaminophen, ipratropium-albuterol, ondansetron **OR** ondansetron (ZOFRAN) IV, senna-docusate, sevelamer carbonate, sodium chloride flush  Current Labs: reviewed    Physical Exam:  Blood pressure 105/64, pulse 97, temperature (!) 97.4 F (36.3 C), resp. rate (!) 26, height 5\' 1"  (1.549 m), weight 83.4 kg, SpO2 93 %.  Gen Chronically ill-appearing, NAD HEENT NCAT Heart - S1 and S2; no rub Lungs - Clear but reduced breath sounds bilaterally; on 5 liters oxygen  Abd soft and nondistended, nontender Extremities trace edema lower extremities Neuro - very hard of hearing; able  to tell me her name and year after 3 attempts (may be related to hearing); can't tell me how much oxygen she's on normally  Access Right IJ TDC   Assessment/Plan: 1. new acute/subacute infarct - per primary and neuro, med mgmt 2. ESRD -  TTS on schedule.   3. hypotension  - midodrine for BP support - 10 mg pre and 10 mg mid for SBP < 95.  UF as able  4. Anemia  - hgb 10.1 - stable last check - no ESA for now 5. Metabolic bone disease -  Continue Hectorol - resume binders when consistently taking po; update renal panel 6. Nutrition -plus protein 7. GOC - need to revisit given dementia, recurrent CVA, poor functional status requires PTAR transportation to HD; note prior rec for palliative consult 8. DM/hypothyroidism-dysphagia 3 diet 9. COPD -chronic hypoxic respiratory failure/o2 dependence. Optimize UF with HD 10. Anxiety/depression 11. FTT/debility: For SNF at discharge    Recent Labs  Lab 04/20/19 0558 04/21/19 0410 04/22/19 0453  NA 140 140 139  K 3.8 4.2 4.0  CL 97* 98 100  CO2 28 25 25   GLUCOSE 129* 113* 125*  BUN 38* 11 19  CREATININE 7.61* 3.65* 4.98*  CALCIUM 9.6 9.6 9.8   Recent Labs  Lab 04/19/19 2226 04/20/19 0558 04/22/19 0453  WBC 9.8 9.7 12.1*  NEUTROABS 7.1  7.2  --   HGB 11.0* 11.1* 10.1*  HCT 37.4 37.5 33.5*  MCV 101.4* 102.2* 101.8*  PLT 181 191 163       Claudia Desanctis 04/24/2019 7:20 AM

## 2019-04-24 NOTE — Plan of Care (Signed)
  Problem: Self-Care: Goal: Ability to communicate needs accurately will improve Outcome: Progressing  Patient able to request position changes or assistance with toileting.  Problem: Clinical Measurements: Goal: Will remain free from infection Outcome: Progressing   Patient afebrile at this time.  Problem: Nutrition: Goal: Risk of aspiration will decrease Outcome: Not Progressing Staff and family providing assistance with feeding, patient still with coughing episode at lunch, with desaturation. See notes.

## 2019-04-24 NOTE — Progress Notes (Signed)
Patient currently on nasal cannula. SPO2 100%. Patient resting comfortably.

## 2019-04-24 NOTE — Progress Notes (Signed)
  Speech Language Pathology Treatment: Dysphagia  Patient Details Name: Joan Mosley MRN: [id number] DOB: [birth year] Today's Date: 04/24/2019 Time: 6378-5885 SLP Time Calculation (min) (ACUTE ONLY): 11 min  Assessment / Plan / Recommendation Clinical Impression  F/u to address swallowing.  Pt being fed lunch by her daughter, Joan Mosley. Demonstrated slow mastication, coughing x2 after consumption of mixed solid/liquid consistencey.  Sp02 90% on 5 l [place]. Pt is currently on a  dysphagia 3 diet with nectar thick liquids.  Pt known to SLP services - prior difficulties in swallowing have been related to COPD and transient dyssynchrony with swallowing and respirations. Last MBS in November with a mild dysphagia.    Returned to room this afternoon to speak with pt, Joan Mosley, and RN about repeating MBS to determine current swallowing status.  RN and RT present due to concerns for aspiration event and desaturation.  Pt currently on ventimask 15L, saturating at 90%.  Recommend proceeding with MBS when respiratory status stabilizes (either this afternoon or tomorrow.) D/W RN. Will follow.     HPI HPI: Joan Mosley is an 74 y.o. female past medical history that includes COPD with chronic hypoxic respiratory failure, end-stage renal disease on dialysis Tuesday Thursday Saturday, hypothyroidism, insulin-dependent diabetes, chronic diastolic CHF, history of CVA, recent admission with acute on chronic respiratory failure secondary to Covid presented to the emergency department March 24 chief complaint increased confusion and headache.  Initial work-up in the emergency department included an MRI without contrast of the brain revealing an acute or subacute right temporal infarct. Prior admission notes include the following- 1/23 CT chest>>"no PE, near complete collapse of both the right middle and right lower lobes with extensive endobronchial debris. Consolidation involving the right upper lobe concerning for pneumonia or  aspiration pneumonia." Speech consulted for swallowing given concerns for aspiration.  Pt is known to SLP services; has had swallow assessments during admissions in October and November.  MBS on 11/30 revealed mild pharyngeal dysphagia with frank penetration of thin liquids to the vocal folds; chin tuck assisted in airway protection.  F/U MBS recommended during Joan Mosley admission, but pt could not tolerate assessment, Discharged on Dys 3/nectar.       SLP Plan  MBS;Continue with current plan of care       Recommendations  Diet recommendations: Dysphagia 3 (mechanical soft);Nectar-thick liquid Liquids provided via: Cup Medication Administration: Whole meds with liquid Supervision: Staff to assist with self feeding Compensations: Slow rate;Small sips/bites Postural Changes and/or Swallow Maneuvers: Seated upright 90 degrees                Oral Care Recommendations: Oral care BID Follow up Recommendations: Skilled Nursing facility SLP Visit Diagnosis: Dysphagia, unspecified (R13.10) Plan: MBS;Continue with current plan of care       GO                Joan Mosley 04/24/2019, 1:30 PM Joan Mosley L. Tivis Ringer, [place] Office number [phone]

## 2019-04-24 NOTE — Progress Notes (Signed)
Called by RN that patient may have aspirated while eating. Upon arrival patient was on 8 LPM nasal cannula with sat 86-92%. RN stated she had deep suctioned patient as well. RT listened to patient BBS clear, diminished. Asked patient to cough and had a non productive, non congested cough. Patient breathing through mouth some and hanging around 86%. RT placed patient on 50% Venturi mask and patient is 90% at this time. HR 101. Patient is in no distress. RN paged MD and CXR ordered. RT will continue to monitor patient.

## 2019-04-24 NOTE — Progress Notes (Signed)
Pt placed on cpap for the night. Tol well at this time.

## 2019-04-24 NOTE — Progress Notes (Addendum)
Physical Therapy Treatment Patient Details Name: Joan Mosley MRN: [id number] DOB: [birth year] Today's Date: 04/24/2019    History of Present Illness Pt is a 74 y/o female admitted secondary to confusion and headache. Per notes, pt has also become close to bed bound secondary to progressive weakness. PMH includes DM, ESRD on HD, COPD, dCHF, and recent COVID infection. MRI: Small acute to subacute right temporal infarct.    PT Comments    Pt supine in bed on arrival.  She is HOH and required repeated cueing.  Pt very painful when mobilizing.  She was able to come to sitting edge of bed this session but presents with L lateral lean.  Continue to recommend SNF placement at this time.     Follow Up Recommendations  SNF;Supervision/Assistance - 24 hour     Equipment Recommendations  None recommended by PT    Recommendations for Other Services       Precautions / Restrictions Precautions Precautions: Fall Restrictions Weight Bearing Restrictions: No    Mobility  Bed Mobility Overal bed mobility: Needs Assistance Bed Mobility: Rolling;Supine to Sit;Sit to Supine Rolling: Total assist;+2 for physical assistance   Supine to sit: Max assist;+2 for physical assistance Sit to supine: Total assist;+2 for physical assistance   General bed mobility comments: Rolling into sidelying for pericare assessment.  Pt required cues for hand placement and use of chux pad to move to edge of bed Noted with posterior lean in sitting.  Transfers                 General transfer comment: unable  Ambulation/Gait                 Stairs             Wheelchair Mobility    Modified Rankin (Stroke Patients Only)       Balance Overall balance assessment: Needs assistance   Sitting balance-Leahy Scale: Poor Sitting balance - Comments: required external assistance to maintain sitting edge of bed.  Presents with L sided lean and assistance for cervical extension and weight  shifting to the R.                                    Cognition Arousal/Alertness: Awake/alert Behavior During Therapy: Flat affect Overall Cognitive Status: No family/caregiver present to determine baseline cognitive functioning                                 General Comments: Pt extremely HOH      Exercises Other Exercises Other Exercises: 2x5 reps cervical flexion and extension.    General Comments        Pertinent Vitals/Pain Pain Assessment: No/denies pain Faces Pain Scale: Hurts little more Pain Location: right and left knee with flexion Pain Descriptors / Indicators: Grimacing;Moaning Pain Intervention(s): Monitored during session;Repositioned    Home Living                      Prior Function            PT Goals (current goals can now be found in the care plan section) Acute Rehab PT Goals Patient Stated Goal: to go home Potential to Achieve Goals: Fair Progress towards PT goals: Progressing toward goals    Frequency    Min 2X/week      PT  Plan Current plan remains appropriate    Co-evaluation              AM-PAC PT "6 Clicks" Mobility   Outcome Measure  Help needed turning from your back to your side while in a flat bed without using bedrails?: Total Help needed moving from lying on your back to sitting on the side of a flat bed without using bedrails?: Total Help needed moving to and from a bed to a chair (including a wheelchair)?: Total Help needed standing up from a chair using your arms (e.g., wheelchair or bedside chair)?: Total Help needed to walk in hospital room?: Total Help needed climbing 3-5 steps with a railing? : Total 6 Click Score: 6    End of Session Equipment Utilized During Treatment: Gait belt Activity Tolerance: Patient limited by lethargy Patient left: in bed;with call bell/phone within reach;with bed alarm set(Bed placed in chair position with BUEs propped) Nurse  Communication: Mobility status;Other (comment) PT Visit Diagnosis: Muscle weakness (generalized) (M62.81);Difficulty in walking, not elsewhere classified (R26.2);Unsteadiness on feet (R26.81)     Time: 2336-1224 PT Time Calculation (min) (ACUTE ONLY): 30 min  Charges:  $Therapeutic Activity: 23-37 mins                     Erasmo Leventhal , PTA Acute Rehabilitation Services Pager [phone] Office [phone]     Lexie Koehl Eli Hose 04/24/2019, 3:19 PM

## 2019-04-24 NOTE — Progress Notes (Signed)
Patient alert and oriented, able to converse. O2 sats 90-92% on Venturi mask. Lungs clear though diminished. Patient denies distress at this time.

## 2019-04-24 NOTE — Progress Notes (Signed)
PROGRESS NOTE    Joan Mosley  UQJ:[id number] DOB: 03/24/1945 DOA: 04/19/2019 PCP: Flossie Buffy, NP   Brief Narrative:  74 year old with prior h/o COPD, type 2 DM, ESRD on HD, chronic diastolic heart failure, insulin dependent DM, hypothyroidism, recently admitted in January for acute respiratory failure secondary to COVID-19 now presents to ED for altered mental status.  She was found to be afebrile on 3 to 4 L of nasal cannula oxygen.  Initial CT of the head did not show any acute stroke.  She was admitted for evaluation of stroke.  MRI of the brain shows Small acute to subacute right temporal infarct. Moderate to advanced chronic microvascular ischemic changes.  MRA of the head and neck showsMild atheromatous irregularity about the carotid bifurcations without hemodynamically significant stenosis. Patient had some increased work of breathing yesterday and CXR ordered, showed Diffuse interstitial prominence consistent with chronic interstitial lung disease. Decreased right pleural effusion. Pt seen and examined at bedside, she denies any new complaints. In view of her dementia, new and recurrent CVA, poor functional status, general deconditioning, poor progression, as per nephrology recommendations, we will request palliative care consult to re visit goals of care.   Notified by RN that patient had a coughing/aspiration episode during eating lunch after which her oxygen sats dropped to 86% and she required to be placed on 50% oxygen with sats between 90 to 92%.  A chest x-ray ordered does not show any aspiration pneumonia at this time.  We will continue to monitor closely overnight.     Assessment & Plan:   Principal Problem:   Stroke [facility]) Active Problems:   Type 2 diabetes mellitus with diabetic polyneuropathy, with long-term current use of insulin (HCC)   Hypothyroidism   ESRD (end stage renal disease) (HCC)   Chronic respiratory failure with hypoxia, on home O2 therapy (HCC)  Depression with anxiety   COPD (chronic obstructive pulmonary disease) ([place])   DNR (do not resuscitate) discussion   Acute encephalopathy   Adult failure to thrive   Acute right temporal infarct:  Admitted for evaluation of stroke.    Initial CT of the head did not show any acute stroke.  She was admitted for evaluation of stroke.  MRI of the brain shows Small acute to subacute right temporal infarct. Moderate to advanced chronic microvascular ischemic changes.  MRA of the head and neck showsMild atheromatous irregularity about the carotid bifurcations without hemodynamically significant stenosis. Echocardiogram showed Left ventricular ejection fraction, by estimation, is 60 to 65%. The  left ventricle has normal function. Left ventricular endocardial border  not optimally defined to evaluate regional wall motion. Indeterminate  diastolic filling due to E-A fusion.  continue with Aspirin 81 mg daily.  Increase Crestor to 20 mg daily Neurology signed off.  Therapy evaluations recommending SNF.  TOC aware. Discussed the above with the patient's son.  Outpatient follow-up with neurology in about 4 to 6 weeks.    Hypothyroidism:  Continue with Synthroid   End-stage renal disease on dialysis Nephrology on board and appreciate recommendations.   COPD Patient is on BiPAP at night at home continue the same. Diminished air entry at bases.   Depression with anxiety Continue with home medications.   Chronic respiratory failure with hypoxia requiring between 4 to 5 lit of nasal cannula oxygen tachypneic today.  Chest x-ray-today showed similar findings compared to 2 days ago.   Coughing episode during lunch Suspect aspiration event./Hypoxic requiring 50% of oxygen to keep sats greater  than 90%.  Chest x-ray ordered does not show any aspiration pneumonia at this time. Continue to monitor the patient closely, high risk for decompensation and aspiration.  BiPAP to be used at  night.  DVT prophylaxis: Heparin.  Code Status: Full code.  Family Communication:  Discussed with son  Disposition Plan:  . Patient came from: HOME.            Marland Kitchen Anticipated d/c place: SNF . Barriers to d/c OR conditions which need to be met to effect a safe d/c: TOC on board, waiting for placement, she is stable for discharge to SNF.    Consultants:   Neurology  Nephrology.   Palliative care consult.    Procedures: MRI brain without contrast    Antimicrobials: None.    Subjective:  Currently on 50% Ventimask appears comfortable with sats between 90 to 92%.  Objective: Vitals:   04/24/19 1500 04/24/19 1600 04/24/19 1611 04/24/19 1623  BP:  107/70  110/74  Pulse:    97  Resp: (!) 24 (!) 23  (!) 22  Temp:    97.7 F (36.5 C)  TempSrc:    Axillary  SpO2: 93% 92% 94% 94%  Weight:      Height:        Intake/Output Summary (Last 24 hours) at 04/24/2019 1810 Last data filed at 04/24/2019 1300 Gross per 24 hour  Intake 620 ml  Output --  Net 620 ml   Filed Weights   04/20/19 1737 04/22/19 0652 04/22/19 1111  Weight: 85.4 kg 85.8 kg 83.4 kg    Examination:  General exam: Comfortable on 50% Ventimask Respiratory system: Diminished air entry at bases, no wheezing or rhonchi, tachypnea present Cardiovascular system: S1-S2 heard, regular rate rhythm, no JVD, no pedal edema Gastrointestinal system: Abdomen is soft, nontender nondistended bowel sounds normal Central nervous system: Alert able to follow simple commands. Extremities: No pedal edema Skin: No ulcers  psychiatry: Flat affect    Data Reviewed: I have personally reviewed following labs and imaging studies  CBC: Recent Labs  Lab 04/19/19 2226 04/20/19 0558 04/22/19 0453  WBC 9.8 9.7 12.1*  NEUTROABS 7.1 7.2  --   HGB 11.0* 11.1* 10.1*  HCT 37.4 37.5 33.5*  MCV 101.4* 102.2* 101.8*  PLT 181 191 384   Basic Metabolic Panel: Recent Labs  Lab 04/19/19 2226 04/20/19 0558 04/21/19 0410  04/22/19 0453 04/24/19 0838  NA 141 140 140 139 137  K 4.0 3.8 4.2 4.0 3.8  CL 96* 97* 98 100 97*  CO2 27 28 25 25 25   GLUCOSE 136* 129* 113* 125* 157*  BUN 37* 38* 11 19 21   CREATININE 7.69* 7.61* 3.65* 4.98* 5.22*  CALCIUM 9.6 9.6 9.6 9.8 10.0  PHOS  --   --   --   --  3.7   GFR: Estimated Creatinine Clearance: 9.1 mL/min (A) (by C-G formula based on SCr of 5.22 mg/dL (H)). Liver Function Tests: Recent Labs  Lab 04/19/19 2226 04/20/19 0558 04/24/19 0838  AST 47* 48*  --   ALT 15 15  --   ALKPHOS 107 108  --   BILITOT 1.1 0.9  --   PROT 6.2* 6.2*  --   ALBUMIN 2.7* 2.6* 2.4*   No results for input(s): LIPASE, AMYLASE in the last 168 hours. Recent Labs  Lab 04/21/19 1323  AMMONIA 35   Coagulation Profile: No results for input(s): INR, PROTIME in the last 168 hours. Cardiac Enzymes: No results for input(s): CKTOTAL, CKMB,  CKMBINDEX, TROPONINI in the last 168 hours. BNP (last 3 results) Recent Labs    04/10/19 1418  PROBNP 81.0   HbA1C: No results for input(s): HGBA1C in the last 72 hours. CBG: Recent Labs  Lab 04/23/19 1641 04/23/19 2127 04/24/19 0644 04/24/19 1217 04/24/19 1627  GLUCAP 141* 143* 114* 118* 130*   Lipid Profile: No results for input(s): CHOL, HDL, LDLCALC, TRIG, CHOLHDL, LDLDIRECT in the last 72 hours. Thyroid Function Tests: No results for input(s): TSH, T4TOTAL, FREET4, T3FREE, THYROIDAB in the last 72 hours. Anemia Panel: No results for input(s): VITAMINB12, FOLATE, FERRITIN, TIBC, IRON, RETICCTPCT in the last 72 hours. Sepsis Labs: No results for input(s): PROCALCITON, LATICACIDVEN in the last 168 hours.  Recent Results (from the past 240 hour(s))  SARS CORONAVIRUS 2 (TAT 6-24 HRS) Nasopharyngeal Nasopharyngeal Swab     Status: None   Collection Time: 04/15/19 10:55 PM   Specimen: Nasopharyngeal Swab  Result Value Ref Range Status   SARS Coronavirus 2 NEGATIVE NEGATIVE Final    Comment: (NOTE) SARS-CoV-2 target nucleic acids  are NOT DETECTED. The SARS-CoV-2 RNA is generally detectable in upper and lower respiratory specimens during the acute phase of infection. Negative results do not preclude SARS-CoV-2 infection, do not rule out co-infections with other pathogens, and should not be used as the sole basis for treatment or other patient management decisions. Negative results must be combined with clinical observations, patient history, and epidemiological information. The expected result is Negative. Fact Sheet for Patients: SugarRoll.be Fact Sheet for Healthcare Providers: https://www.woods-mathews.com/ This test is not yet approved or cleared by the [place] FDA and  has been authorized for detection and/or diagnosis of SARS-CoV-2 by FDA under an Emergency Use Authorization (EUA). This EUA will remain  in effect (meaning this test can be used) for the duration of the COVID-19 declaration under Section 56 4(b)(1) of the Act, 21 U.S.C. section 360bbb-3(b)(1), unless the authorization is terminated or revoked sooner. Performed at [facility], [place] [address], [place], [place] [address]          Radiology Studies: DG CHEST PORT 1 VIEW  Result Date: 04/24/2019 CLINICAL DATA:  Aspiration EXAM: PORTABLE CHEST 1 VIEW COMPARISON:  April 22, 2019 FINDINGS: There is a well-positioned tunneled dialysis catheter on the right. The lung volumes are slightly hyperexpanded. The heart size is stable but enlarged. There are small bilateral pleural effusions with adjacent airspace disease favored to represent atelectasis. There is no pneumothorax. No new large focal airspace opacity concerning for aspiration. IMPRESSION: Essentially stable appearance of the chest without evidence for aspiration. Electronically Signed   By: Constance Holster M.D.   On: 04/24/2019 14:57        Scheduled Meds: .  stroke: mapping our early stages of recovery book   Does not apply Once   . aspirin EC  81 mg Oral Daily  . atorvastatin  40 mg Oral q1800  . Chlorhexidine Gluconate Cloth  6 each Topical Q0600  . [START ON 04/25/2019] Chlorhexidine Gluconate Cloth  6 each Topical Q0600  . doxercalciferol  3 mcg Intravenous Q T,Th,Sa-HD  . FLUoxetine  10 mg Oral Daily  . heparin  5,000 Units Subcutaneous Q8H  . insulin aspart  0-6 Units Subcutaneous TID WC  . levothyroxine  112 mcg Oral Q0600  . midodrine  10 mg Oral Q T,Th,Sa-HD  . sodium chloride flush  3 mL Intravenous Q12H  . sodium chloride flush  3 mL Intravenous Q12H   Continuous Infusions: .  sodium chloride    . sodium chloride 10 mL/hr at 04/20/19 1759     LOS: 4 days        Hosie Poisson, MD [facility]   To contact the attending provider between 7A-7P or the covering provider during after hours 7P-7A, please log into the web site [email or web address] and access using universal [place] password for that web site. If you do not have the password, please call the hospital operator.  04/24/2019, 6:10 PM

## 2019-04-24 NOTE — Progress Notes (Signed)
RN hearing alarm while walking past room, Patient coughing and O2 saturations in high 80's.  [place] suction attempted, then deep suction, O2 also turned up to 9 L. Patient came up to 89-90% but as soon as RN attempted to wean patient down to baseline 5 L, patient again desaturated. MD notified. Orders received. RT at bedside, patient now with venti mask on 15 L, saturations at 90%

## 2019-04-24 NOTE — Progress Notes (Signed)
Patient placed on BIpap over night.

## 2019-04-24 NOTE — Consult Note (Signed)
Consultation Note Date: 04/24/2019   Patient Name: Joan Mosley  DOB: Oct 22, 1945  MRN: [id number]  Age / Sex: 74 y.o., female  PCP: Nche, Charlene Brooke, NP Referring Physician: Hosie Poisson, MD  Reason for Consultation: Establishing goals of care and Psychosocial/spiritual support  HPI/Patient Profile: 74 y.o. female  admitted on 04/19/2019  With a past medical history significant for COPD with chronic hypoxic respiratory failure, ESRD, hypothyroidism, insulin-dependent diabetes mellitus, chronic diastolic CHF, history of CVA, and admission in January with acute respiratory disease secondary to COVID-19, now presenting to the emergency department with increased confusion and headache.    Patient had been seen in the ED a few days ago with confusion and a choking episode, but was stable for discharge home at that time.   Family report continued physical, and functional decline worsening over the last many months.  She lives at home with her daughter, son and a grandson who all share in the caregiving.  She has been nonambulatory for 3 years it is getting more more difficult to provide care for the patient at home and specifically in the last few weeks her son the main caregiver, is currently in a 30-day rehab program.  Patient is high risk for decompensation.  Patient and family face treatment option decisions, advanced directive decisions and anticipatory care needs.    Clinical Assessment and Goals of Care:   This NP Wadie Lessen reviewed medical records, received report from team, assessed the patient and then meet at the patient's bedside along with her daughter/ [name]  to discuss diagnosis, prognosis, GOC, EOL wishes disposition and options.  Concept of Hospice and Palliative Care were discussed.     I also spoke by phone with Cyndi Lennert who patient and family sees as main Scientist, research (medical),  he is a para-medic in [place].  He verbalizes understanding that the patient is continuing to decline and requiring more more care at home, which is becoming more difficult for family to provide.  Ultimately they would like to avoid any long-term placement, however they are open to short-term rehab with the plan for her to return home when son [name] will be able to help with care giving.  All understand that providing care in the home is becoming increasingly difficult   A detailed discussion was had today regarding advanced directives.  Concepts specific to code status, artifical feeding and hydration, continued IV antibiotics and rehospitalization was had.  The difference between a aggressive medical intervention path  and a palliative comfort care path for this patient at this time was had.  Values and goals of care important to patient and family were attempted to be elicited.  MOST form re-introduced.  A MOST form was completed in September 2020 however both patient and her family have withdrawn those decisions and at this time patient is open to all offered and available medical interventions to prolong life.  Natural trajectory and expectations at EOL were discussed.  Questions and concerns addressed.   Family encouraged to call with questions  or concerns.    PMT will continue to support holistically.    No documented healthcare power of attorney however patient and family look to son Journi Moffa as the main medical decision maker for patient in the event she cannot make decisions for herself.      SUMMARY OF RECOMMENDATIONS    Code Status/Advance Care Planning:  Full code   Patient and family are open to all offered and available medical interventions to prolong life.   Palliative Prophylaxis:   Aspiration, Bowel Regimen, Delirium Protocol and Frequent Pain Assessment  Additional Recommendations (Limitations, Scope, Preferences):  Full Scope Treatment  Psycho-social/Spiritual:    Desire for further Chaplaincy support:yes  Additional Recommendations: Created space and opportunity for patient and her family to explore their thoughts and feelings regarding patient's current medical situation and unfortunate continued physical and functional decline over the past many months.    Prognosis:   Unable to determine  Discharge Planning:  Recommend outpatient community-based palliative services on discharge  [place] for rehab with Palliative care service follow-up      Primary Diagnoses: Present on Admission: . Acute encephalopathy . COPD (chronic obstructive pulmonary disease) ([place]) . ESRD (end stage renal disease) ([place]) . Depression with anxiety . Hypothyroidism . Stroke [facility])   I have reviewed the medical record, interviewed the patient and family, and examined the patient. The following aspects are pertinent.  Past Medical History:  Diagnosis Date  . Arthritis   . COPD (chronic obstructive pulmonary disease) ([place])   . Diabetes mellitus without complication ([place])   . Diabetic neuropathy ([place])   . 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 Renal disorder    ESRD  . Thyroid disease   . TIA (transient ischemic attack)    Social History   Socioeconomic History  . Marital status: Widowed    Spouse name: Not on file  . Number of children: 5  . Years of education: Not on file  . Highest education level: Not on file  Occupational History    Comment: disabled  Tobacco Use  . Smoking status: Former Smoker    Packs/day: 1.00    Years: 35.00    Pack years: 35.00    Quit date: 03/29/1981    Years since quitting: 38.0  . Smokeless tobacco: Never Used  Substance and Sexual Activity  . Alcohol use: Never  . Drug use: Never  . Sexual activity: Not Currently  Other Topics Concern  . Not on file  Social History Narrative  . Not on file   Social Determinants of Health   Financial Resource Strain:   . Difficulty of Paying Living  Expenses:   Food Insecurity:   . Worried About Charity fundraiser in the Last Year:   . Arboriculturist in the Last Year:   Transportation Needs:   . Film/video editor (Medical):   Marland Kitchen Lack of Transportation (Non-Medical):   Physical Activity:   . Days of Exercise per Week:   . Minutes of Exercise per Session:   Stress:   . Feeling of Stress :   Social Connections:   . Frequency of Communication with Friends and Family:   . Frequency of Social Gatherings with Friends and Family:   . Attends Religious Services:   . Active Member of Clubs or Organizations:   . Attends Archivist Meetings:   Marland Kitchen Marital Status:    Family History  Problem Relation Age of Onset  .  Heart disease Mother   . Hypertension Mother   . Heart disease Father   . Hypertension Father    Scheduled Meds: .  stroke: mapping our early stages of recovery book   Does not apply Once  . aspirin EC  81 mg Oral Daily  . atorvastatin  40 mg Oral q1800  . Chlorhexidine Gluconate Cloth  6 each Topical Q0600  . doxercalciferol  3 mcg Intravenous Q T,Th,Sa-HD  . FLUoxetine  10 mg Oral Daily  . heparin  5,000 Units Subcutaneous Q8H  . insulin aspart  0-6 Units Subcutaneous TID WC  . levothyroxine  112 mcg Oral Q0600  . midodrine  10 mg Oral Q T,Th,Sa-HD  . sodium chloride flush  3 mL Intravenous Q12H  . sodium chloride flush  3 mL Intravenous Q12H   Continuous Infusions: . sodium chloride    . sodium chloride 10 mL/hr at 04/20/19 1759   PRN Meds:.sodium chloride, acetaminophen **OR** acetaminophen, ipratropium-albuterol, ondansetron **OR** ondansetron (ZOFRAN) IV, senna-docusate, sevelamer carbonate, sodium chloride flush Medications Prior to Admission:  Prior to Admission medications   Medication Sig Start Date End Date Taking? Authorizing Provider  acetaminophen (TYLENOL) 500 MG tablet Take 1,000 mg by mouth every 6 (six) hours as needed (for arthritic pain).    Yes [provider]    calcitRIOL (ROCALTROL) 0.25 MCG capsule Take 7 capsules (1.75 mcg total) by mouth Every Tuesday,Thursday,and Saturday with dialysis. 11/22/18  Yes Hosie Poisson, MD  clonazePAM (KLONOPIN) 0.5 MG tablet TAKE 1/2 (ONE-HALF) TABLET BY MOUTH IN THE MORNING AND 1 IN THE EVENING 04/18/19  Yes Nche, Charlene Brooke, NP  FLUoxetine (PROZAC) 10 MG tablet Take 1 tablet (10 mg total) by mouth daily. 02/08/19  Yes Nche, Charlene Brooke, NP  food thickener (THICK IT) POWD Take by mouth as needed (drinks).   Yes [provider]  furosemide (LASIX) 80 MG tablet Take 80 mg by mouth.   Yes [provider]  guaiFENesin (MUCINEX) 600 MG 12 hr tablet Take 1 tablet (600 mg total) by mouth 2 (two) times daily. 02/22/19  Yes Hosie Poisson, MD  insulin detemir (LEVEMIR) 100 UNIT/ML injection Inject 15 Units into the skin every morning.   Yes [provider]  insulin lispro (HUMALOG) 100 UNIT/ML injection Inject into the skin 3 (three) times daily before meals. Per sliding scale   Yes [provider]  ipratropium-albuterol (DUONEB) 0.5-2.5 (3) MG/3ML SOLN Take 3 mLs by nebulization See admin instructions. Nebulize and inhale 3 ml's into the lungs every four hours, while awake   Yes [provider]  levalbuterol (XOPENEX HFA) 45 MCG/ACT inhaler Inhale 1-2 puffs into the lungs every 4 (four) hours as needed for wheezing.   Yes [provider]  levothyroxine (SYNTHROID) 112 MCG tablet Take 1 tablet (112 mcg total) by mouth daily before breakfast. 1 AM 04/13/19  Yes Nche, Charlene Brooke, NP  lidocaine (LIDODERM) 5 % Place 1 patch onto the skin daily as needed (pain). Remove & Discard patch within 12 hours or as directed by MD   Yes [provider]  midodrine (PROAMATINE) 10 MG tablet Take 10 mg by mouth See admin instructions. Take 10 mg by mouth on Tuesday, Thursday, Saturday 45 minutes before dialysis   Yes [provider]  montelukast (SINGULAIR) 10 MG tablet Take  10 mg by mouth at bedtime.    Yes [provider]  nystatin (MYCOSTATIN/NYSTOP) powder Apply 1 g topically 2 (two) times daily as needed (as directed- to any  rashes).    Yes [provider]  nystatin cream (MYCOSTATIN) Apply 1 application topically 2 (two) times daily as needed for dry skin.    Yes [provider]  rosuvastatin (CRESTOR) 20 MG tablet Take 1 tablet (20 mg total) by mouth at bedtime. 04/13/19  Yes Nche, Charlene Brooke, NP  ascorbic acid (VITAMIN C) 500 MG tablet Take 1 tablet (500 mg total) by mouth daily. 02/23/19   Hosie Poisson, MD  benzonatate (TESSALON) 200 MG capsule Take 1 capsule (200 mg total) by mouth 2 (two) times daily as needed for cough. 02/08/19   Nche, Charlene Brooke, NP  Darbepoetin Alfa (ARANESP) 25 MCG/0.42ML SOSY injection Inject 0.42 mLs (25 mcg total) into the vein every Tuesday with hemodialysis. 11/22/18   Hosie Poisson, MD  docusate sodium (COLACE) 100 MG capsule Take 100 mg by mouth every morning.    [provider]  Lubricants (K-Y LUBRICANT JELLY SENSITIVE EX) Place 1 application into both nostrils as needed (as directed- for lubrication).     [provider]  multivitamin (RENA-VIT) TABS tablet Take 1 tablet by mouth at bedtime. 11/21/18   Hosie Poisson, MD  NASAL SPRAY SALINE NA Place 1-2 sprays into both nostrils as needed (for congestion- "[employer] Simply Saline" brand).    [provider]  Nutritional Supplements (FEEDING SUPPLEMENT, NEPRO CARB STEADY,) LIQD Take 237 mLs by mouth 2 (two) times daily between meals. 11/22/18   Hosie Poisson, MD  OVER THE COUNTER MEDICATION Stool softner otc    [provider]  oxymetazoline (AFRIN) 0.05 % nasal spray Place 1 spray into both nostrils 2 (two) times daily as needed for congestion.    [provider]  senna (SENOKOT) 8.6 MG TABS tablet Take 1 tablet by mouth every morning.    [provider]  sevelamer carbonate (RENVELA) 800 MG  tablet Take 1 tablet (800 mg total) by mouth as needed (with snacks). 02/22/19   Hosie Poisson, MD  VENTOLIN HFA 108 (90 Base) MCG/ACT inhaler Inhale 2 puffs into the lungs every 6 (six) hours as needed for wheezing or shortness of breath. 02/14/19   Nche, Charlene Brooke, NP  heparin 5000 UNIT/ML injection Inject 1.5 mLs (7,500 Units total) into the skin every 8 (eight) hours. Patient not taking: Reported on 04/20/2019 02/22/19 04/20/19  Hosie Poisson, MD   Allergies  Allergen Reactions  . Avelox [Moxifloxacin Hcl In Nacl] Other (See Comments)    Unknown reaction  . Codeine Anaphylaxis  . Penicillins Rash    Did it involve swelling of the face/tongue/throat, SOB, or low BP? Unk Did it involve sudden or severe rash/hives, skin peeling, or any reaction on the inside of your mouth or nose? Yes Did you need to seek medical attention at a hospital or doctor's office? Unk When did it last happen? Unk If all above answers are "NO", may proceed with cephalosporin use.   . Sulfa Antibiotics Other (See Comments)    Unknown reaction  . Dextromethorphan-Guaifenesin Other (See Comments)    Reported by [facility] - unknown reaction. Pt states she is not allergic to dextromethorphan-guaifenesin.  . Shellfish Allergy Hives, Itching and Rash   Review of Systems  Musculoskeletal: Positive for back pain.    Physical Exam Constitutional:      Appearance: She is overweight. She is ill-appearing.  Cardiovascular:     Rate and Rhythm: Normal rate and regular rhythm.  Musculoskeletal:     Comments: Generalized weakness and muscle atrophy, patient has been nonambulatory  for 3 years according to family  Skin:    General: Skin is warm and dry.     Comments: Sacral and perineal area moist and macerated  Neurological:     Mental Status: She is alert.     Vital Signs: BP 107/70   Pulse 92   Temp 98.2 F (36.8 C) (Oral)   Resp 18   Ht 5\' 1"  (1.549 m)   Wt 83.4 kg   SpO2 97%   BMI 34.74 kg/m  Pain  Scale: 0-10   Pain Score: 0-No pain   SpO2: SpO2: 97 % O2 Device:SpO2: 97 % O2 Flow Rate: .O2 Flow Rate (L/min): 5 L/min  IO: Intake/output summary:   Intake/Output Summary (Last 24 hours) at 04/24/2019 7129 Last data filed at 04/23/2019 1300 Gross per 24 hour  Intake 240 ml  Output --  Net 240 ml    LBM: Last BM Date: 04/23/19 Baseline Weight: Weight: 87.1 kg Most recent weight: Weight: 83.4 kg     Palliative Assessment/Data: 30 % at best   Discussed with Dr Karleen Hampshire  Time In: 1030 Time Out: 1145 Time Total: 75 minutes Greater than 50%  of this time was spent counseling and coordinating care related to the above assessment and plan.  Signed by: Wadie Lessen, NP   Please contact Palliative Medicine Team phone at [phone] for questions and concerns.  For individual provider: See Shea Evans

## 2019-04-25 ENCOUNTER — Inpatient Hospital Stay (HOSPITAL_COMMUNITY): Payer: Medicare Other

## 2019-04-25 LAB — GLUCOSE, CAPILLARY
Glucose-Capillary: 120 mg/dL — ABNORMAL HIGH (ref 70–99)
Glucose-Capillary: 112 mg/dL — ABNORMAL HIGH (ref 70–99)
Glucose-Capillary: 117 mg/dL — ABNORMAL HIGH (ref 70–99)
Glucose-Capillary: 100 mg/dL — ABNORMAL HIGH (ref 70–99)

## 2019-04-25 LAB — RENAL FUNCTION PANEL
Albumin: 2.2 g/dL — ABNORMAL LOW (ref 3.5–5.0)
Anion gap: 17 — ABNORMAL HIGH (ref 5–15)
BUN: 32 mg/dL — ABNORMAL HIGH (ref 8–23)
CO2: 25 mmol/L (ref 22–32)
Calcium: 9.8 mg/dL (ref 8.9–10.3)
Chloride: 96 mmol/L — ABNORMAL LOW (ref 98–111)
Creatinine, Ser: 6.44 mg/dL — ABNORMAL HIGH (ref 0.44–1.00)
GFR calc Af Amer: 7 mL/min — ABNORMAL LOW (ref >60)
GFR calc non Af Amer: 6 mL/min — ABNORMAL LOW (ref >60)
Glucose, Bld: 112 mg/dL — ABNORMAL HIGH (ref 70–99)
Phosphorus: 4.5 mg/dL (ref 2.5–4.6)
Potassium: 4.5 mmol/L (ref 3.5–5.1)
Sodium: 138 mmol/L (ref 135–145)

## 2019-04-25 MED ORDER — ALBUMIN HUMAN 25 % IV SOLN: 25.0000 g | Freq: Once | INTRAVENOUS | Status: AC

## 2019-04-25 MED ORDER — ALBUMIN HUMAN 25 % IV SOLN
INTRAVENOUS | Status: AC
  Administered 2019-04-25: 25 g via INTRAVENOUS
  Filled 2019-04-25: qty 100

## 2019-04-25 NOTE — Progress Notes (Signed)
Modified Barium Swallow Progress Note  Patient Details  Name: Joan Mosley MRN: [id number] Date of Birth: 05-17-1945  Today's Date: 04/25/2019  Modified Barium Swallow completed.  Full report located under Chart Review in the Imaging Section.  Brief recommendations include the following:  Clinical Impression  Patient presents with mild oropharyngeal dysphagia. Oral phase is remarkable for decreased bolus cohesion resulting in premature spillage over the epiglottis and oral residue. Pt required Mod cues to initate multiple swallows, reducing oral residue. Pt has a very curved epiglottis, resulting in a narrow vallecular space. When premature spillage occurs, barium passes over the vallecula, and mistimed epiglottic inversion resulted in deep penetration to the vocal cords (PAS 4). A chin tuck was initially utilized to attempt to improve airway protection (per previous notes), however, the patient was observed to have better oral control when not using a chin tuck. This prevented premature spillage and penetration. Trace esophageal backflow was noted with all consistencies, and a minimal amount of barium was observed in the esophagus. Recommend mechanical soft diet (Dys 3), thin liquids, and meds crushed in puree. Ensure the pt is following general aspiration precautions, checking intermittently for oral residue.    Swallow Evaluation Recommendations   Recommended Consults: Consider esophageal assessment   SLP Diet Recommendations: Dysphagia 3 (Mech soft) solids;Thin liquid   Liquid Administration via: Straw;Cup   Medication Administration: Crushed with puree   Supervision: Staff to assist with self feeding   Compensations: Slow rate;Small sips/bites   Postural Changes: Seated upright at 90 degrees   Oral Care Recommendations: Oral care BID        Aline August, Student SLP Office: [phone]  04/25/2019,1:41 PM

## 2019-04-25 NOTE — Discharge Summary (Addendum)
Physician Discharge Summary  Joan Mosley PYP:[id number] DOB: July 10, 1945 DOA: 04/19/2019  PCP: Flossie Buffy, NP  Admit date: 04/19/2019 Discharge date: 04/25/2019  Admitted From: Home.  Disposition:  snf  Recommendations for Outpatient Follow-up:  1. Follow up with PCP in 1-2 weeks 2. Please obtain BMP/CBC in one week Please follow up with Neurology in 6 weeks as recommended. Please follow up with outpatient palliative care services on discharge.    Discharge Condition:stable.  CODE STATUS:Full code. Diet recommendation: renal diet , Dysphagia 3 (mechanical soft);with thin liquids  Brief/Interim Summary:  73 year old with prior h/o COPD, type 2 DM, ESRD on HD, chronic diastolic heart failure, insulin dependent DM, hypothyroidism, recently admitted in January for acute respiratory failure secondary to COVID-19 now presents to ED for altered mental status.  She was found to be afebrile on 3 to 4 L of nasal cannula oxygen.  Initial CT of the head did not show any acute stroke.  She was admitted for evaluation of stroke.  MRI of the brain shows Small acute to subacute right temporal infarct. Moderate to advanced chronic microvascular ischemic changes.  MRA of the head and neck showsMild atheromatous irregularity about the carotid bifurcations without hemodynamically significant stenosis. Patient had some increased work of breathing yesterday and CXR ordered, showed Diffuse interstitial prominence consistent with chronic interstitial lung disease. Decreased right pleural effusion. Pt seen and examined at bedside, she denies any new complaints. In view of her dementia, new and recurrent CVA, poor functional status, general deconditioning, poor progression, as per nephrology recommendations, we will request palliative care consult to re visit goals of care. family would like to pursue all available medical interventions to prolong life.   Discharge Diagnoses:  Principal Problem:   Stroke  [facility]) Active Problems:   Type 2 diabetes mellitus with diabetic polyneuropathy, with long-term current use of insulin (HCC)   Hypothyroidism   ESRD (end stage renal disease) (HCC)   Chronic respiratory failure with hypoxia, on home O2 therapy (HCC)   Depression with anxiety   COPD (chronic obstructive pulmonary disease) ([place])   DNR (do not resuscitate) discussion   Acute encephalopathy   Adult failure to thrive  Acute right temporal infarct:  Admitted for evaluation of stroke.    Initial CT of the head did not show any acute stroke.  She was admitted for evaluation of stroke.  MRI of the brain shows Small acute to subacute right temporal infarct. Moderate to advanced chronic microvascular ischemic changes.  MRA of the head and neck showsMild atheromatous irregularity about the carotid bifurcations without hemodynamically significant stenosis. Echocardiogram showed Left ventricular ejection fraction, by estimation, is 60 to 65%. The  left ventricle has normal function. Left ventricular endocardial border  not optimally defined to evaluate regional wall motion. Indeterminate  diastolic filling due to E-A fusion.  continue with Aspirin 81 mg daily.  On lipitor 40 mg daily.  Neurology signed off.  Therapy evaluations recommending SNF.  TOC aware. Discussed the above with the patient's son.  Outpatient follow-up with neurology in about 4 to 6 weeks.    Hypothyroidism:  Continue with Synthroid   End-stage renal disease on dialysis Nephrology on board and appreciate recommendations.   COPD Patient is on BiPAP at night at home, continue the same. Diminished air entry at bases.   Depression with anxiety Continue with home medications.   Chronic respiratory failure with hypoxia requiring between 4 to 5 lit of nasal cannula oxygen  Chest x-ray showed similar findings  compared to 2 days ago.   Coughing episode during lunch on 3/29 Suspect aspiration event./Hypoxic  requiring 50% of oxygen to keep sats greater than 90%.  Chest x-ray ordered does not show any aspiration pneumonia at this time. We were able to wean her down to 5 lit of [facility] oxygen.  use  BiPAP  at night.   Discharge Instructions  Discharge Instructions    Diet - low sodium heart healthy   Complete by: As directed    Discharge instructions   Complete by: As directed    Please follow up with Nephrology as recommended. And Neurology in 4 to 6 weeks.     Allergies as of 04/25/2019      Reactions   Avelox [moxifloxacin Hcl In Nacl] Other (See Comments)   Unknown reaction   Codeine Anaphylaxis   Penicillins Rash   Did it involve swelling of the face/tongue/throat, SOB, or low BP? Unk Did it involve sudden or severe rash/hives, skin peeling, or any reaction on the inside of your mouth or nose? Yes Did you need to seek medical attention at a hospital or doctor's office? Unk When did it last happen? Unk If all above answers are "NO", may proceed with cephalosporin use.   Sulfa Antibiotics Other (See Comments)   Unknown reaction   Dextromethorphan-guaifenesin Other (See Comments)   Reported by [facility] - unknown reaction. Pt states she is not allergic to dextromethorphan-guaifenesin.   Shellfish Allergy Hives, Itching, Rash      Medication List    STOP taking these medications   calcitRIOL 0.25 MCG capsule Commonly known as: ROCALTROL   heparin 5000 UNIT/ML injection   insulin detemir 100 UNIT/ML injection Commonly known as: LEVEMIR   insulin lispro 100 UNIT/ML injection Commonly known as: HUMALOG   rosuvastatin 20 MG tablet Commonly known as: CRESTOR     TAKE these medications   acetaminophen 500 MG tablet Commonly known as: TYLENOL Take 1,000 mg by mouth every 6 (six) hours as needed (for arthritic pain).   ascorbic acid 500 MG tablet Commonly known as: VITAMIN C Take 1 tablet (500 mg total) by mouth daily.   aspirin 81 MG EC tablet Take 1 tablet (81 mg total)  by mouth daily.   atorvastatin 40 MG tablet Commonly known as: LIPITOR Take 1 tablet (40 mg total) by mouth daily at 6 PM.   benzonatate 200 MG capsule Commonly known as: TESSALON Take 1 capsule (200 mg total) by mouth 2 (two) times daily as needed for cough.   clonazePAM 0.5 MG tablet Commonly known as: KLONOPIN TAKE 1/2 (ONE-HALF) TABLET BY MOUTH IN THE MORNING AND 1 IN THE EVENING   Darbepoetin Alfa 25 MCG/0.42ML Sosy injection Commonly known as: ARANESP Inject 0.42 mLs (25 mcg total) into the vein every Tuesday with hemodialysis.   docusate sodium 100 MG capsule Commonly known as: COLACE Take 100 mg by mouth every morning.   doxercalciferol 4 MCG/2ML injection Commonly known as: HECTOROL Inject 1.5 mLs (3 mcg total) into the vein Every Tuesday,Thursday,and Saturday with dialysis.   feeding supplement (NEPRO CARB STEADY) Liqd Take 237 mLs by mouth 2 (two) times daily between meals.   FLUoxetine 10 MG tablet Commonly known as: PROZAC Take 1 tablet (10 mg total) by mouth daily.   food thickener Powd Commonly known as: THICK IT Take by mouth as needed (drinks).   furosemide 80 MG tablet Commonly known as: LASIX Take 80 mg by mouth.   guaiFENesin 600 MG 12 hr tablet Commonly  known as: MUCINEX Take 1 tablet (600 mg total) by mouth 2 (two) times daily.   ipratropium-albuterol 0.5-2.5 (3) MG/3ML Soln Commonly known as: DUONEB Take 3 mLs by nebulization See admin instructions. Nebulize and inhale 3 ml's into the lungs every four hours, while awake   K-Y LUBRICANT JELLY SENSITIVE EX Place 1 application into both nostrils as needed (as directed- for lubrication).   levalbuterol 45 MCG/ACT inhaler Commonly known as: XOPENEX HFA Inhale 1-2 puffs into the lungs every 4 (four) hours as needed for wheezing.   levothyroxine 112 MCG tablet Commonly known as: SYNTHROID Take 1 tablet (112 mcg total) by mouth daily before breakfast. 1 AM   lidocaine 5 % Commonly known as:  LIDODERM Place 1 patch onto the skin daily as needed (pain). Remove & Discard patch within 12 hours or as directed by MD   midodrine 10 MG tablet Commonly known as: PROAMATINE Take 10 mg by mouth See admin instructions. Take 10 mg by mouth on Tuesday, Thursday, Saturday 45 minutes before dialysis   montelukast 10 MG tablet Commonly known as: SINGULAIR Take 10 mg by mouth at bedtime.   multivitamin Tabs tablet Take 1 tablet by mouth at bedtime.   NASAL SPRAY SALINE NA Place 1-2 sprays into both nostrils as needed (for congestion- "[employer] Simply Saline" brand).   nystatin powder Commonly known as: MYCOSTATIN/NYSTOP Apply 1 g topically 2 (two) times daily as needed (as directed- to any rashes).   nystatin cream Commonly known as: MYCOSTATIN Apply 1 application topically 2 (two) times daily as needed for dry skin.   OVER THE COUNTER MEDICATION Stool softner otc   oxymetazoline 0.05 % nasal spray Commonly known as: AFRIN Place 1 spray into both nostrils 2 (two) times daily as needed for congestion.   senna 8.6 MG Tabs tablet Commonly known as: SENOKOT Take 1 tablet by mouth every morning.   sevelamer carbonate 800 MG tablet Commonly known as: RENVELA Take 1 tablet (800 mg total) by mouth as needed (with snacks).   Ventolin HFA 108 (90 Base) MCG/ACT inhaler Generic drug: albuterol Inhale 2 puffs into the lungs every 6 (six) hours as needed for wheezing or shortness of breath.       Allergies  Allergen Reactions  . Avelox [Moxifloxacin Hcl In Nacl] Other (See Comments)    Unknown reaction  . Codeine Anaphylaxis  . Penicillins Rash    Did it involve swelling of the face/tongue/throat, SOB, or low BP? Unk Did it involve sudden or severe rash/hives, skin peeling, or any reaction on the inside of your mouth or nose? Yes Did you need to seek medical attention at a hospital or doctor's office? Unk When did it last happen? Unk If all above answers are "NO", may  proceed with cephalosporin use.   . Sulfa Antibiotics Other (See Comments)    Unknown reaction  . Dextromethorphan-Guaifenesin Other (See Comments)    Reported by [facility] - unknown reaction. Pt states she is not allergic to dextromethorphan-guaifenesin.  . Shellfish Allergy Hives, Itching and Rash    Consultations:  Nephrology  Neurology  palliative care     Procedures/Studies: CT Head Wo Contrast  Result Date: 04/19/2019 CLINICAL DATA:  Encephalopathy, confusion, diabetes EXAM: CT HEAD WITHOUT CONTRAST TECHNIQUE: Contiguous axial images were obtained from the base of the skull through the vertex without intravenous contrast. COMPARISON:  04/15/2019 FINDINGS: Brain: Chronic small-vessel ischemic changes throughout the periventricular white matter and central pons. No acute infarct or hemorrhage. Lateral ventricles and  midline structures are unremarkable. Stable diffuse cerebral atrophy. No acute extra-axial fluid collections. Stable bilateral subdural hygromas along the convexities. No mass effect. Vascular: No hyperdense vessel or unexpected calcification. Skull: Normal. Negative for fracture or focal lesion. Sinuses/Orbits: No acute finding. Other: None IMPRESSION: 1. Stable exam, no acute process. Electronically Signed   By: Randa Ngo M.D.   On: 04/19/2019 23:25   CT HEAD WO CONTRAST  Result Date: 04/15/2019 CLINICAL DATA:  Mental status change EXAM: CT HEAD WITHOUT CONTRAST TECHNIQUE: Contiguous axial images were obtained from the base of the skull through the vertex without intravenous contrast. COMPARISON:  CT head 12/24/2018, 09/08/2018 FINDINGS: Brain: Stable appearance of prior right thalamic and left insular infarcts. Prominent extra-axial CSF spaces towards the vertex may suggest chronic subdural versus hygroma in this patient history of bilateral subdural hematoma. No evidence of active hemorrhage is seen acute parenchymal hemorrhage hydrocephalus or mass effect/mass  lesion. More symmetric prominence of the ventricles, cisterns and sulci compatible with underlying parenchymal volume loss. Patchy areas of white matter hypoattenuation are most compatible with chronic microvascular angiopathy. Vascular: Atherosclerotic calcification of the carotid siphons. No hyperdense vessel. Skull: No calvarial fracture or suspicious osseous lesion. No scalp swelling or hematoma. Sinuses/Orbits: Paranasal sinuses and mastoid air cells are predominantly clear. Included orbital structures are unremarkable. Other: Edentulous with mild mandibular prognathism. IMPRESSION: 1. No CT evidence for acute intracranial process. 2. Remote right thalamic and left insular infarcts, stable from prior. 3. Prominent extra-axial CSF spaces towards the vertex may suggest chronic subdural versus hygroma in this patient history of bilateral subdural hematoma. 4. Background of parenchymal volume loss and chronic microvascular angiopathy. 5. Intracranial atherosclerosis. Electronically Signed   By: Lovena Le M.D.   On: 04/15/2019 23:17   MR ANGIO NECK WO CONTRAST  Result Date: 04/21/2019 CLINICAL DATA:  Follow-up examination for acute stroke. EXAM: MRA NECK WITHOUT  CONTRAST TECHNIQUE: Multiplanar and multiecho pulse sequences of the neck were obtained without intravenous contrast. Angiographic images of the neck were obtained using MRA technique without and with intravenous contrast. COMPARISON:  Prior MRI from 04/20/2019. FINDINGS: AORTIC ARCH: Examination somewhat technically limited by lack of IV contrast. Aortic arch and origin of the great vessels not assessed on this examination. Partially visualized subclavian arteries are widely patent. RIGHT CAROTID SYSTEM: Right common carotid artery patent from its origin to the bifurcation without stenosis. Mild atheromatous irregularity about the right bifurcation without hemodynamically significant stenosis. Right ICA widely patent to the skull base without  stenosis, occlusion, or obvious dissection. LEFT CAROTID SYSTEM: Visualized left common carotid artery widely patent to the bifurcation. Mild atheromatous irregularity about the left bifurcation without definite flow-limiting stenosis. Left ICA patent distally to the skull base without stenosis, occlusion, or definite dissection. VERTEBRAL ARTERIES: Origin of the left vertebral artery not seen. Visualized vertebral arteries widely patent within the neck with antegrade flow, with no appreciable stenosis or other abnormality. Vertebral arteries are largely codominant. IMPRESSION: 1. Mild atheromatous irregularity about the carotid bifurcations without hemodynamically significant stenosis. Otherwise wide patency of both carotid artery systems within the neck. 2. Wide patency of both vertebral arteries within the neck. Electronically Signed   By: Jeannine Boga M.D.   On: 04/21/2019 01:05   MR BRAIN WO CONTRAST  Result Date: 04/20/2019 CLINICAL DATA:  Encephalopathy EXAM: MRI HEAD WITHOUT CONTRAST TECHNIQUE: Multiplanar, multiecho pulse sequences of the brain and surrounding structures were obtained without intravenous contrast. COMPARISON:  None. FINDINGS: Brain: There a small focus mildly reduced diffusion  in the right temporal lobe. No hemorrhage. There is no intracranial mass, mass effect, or edema. Patchy and confluent areas of T2 hyperintensity in the supratentorial and pontine white matter are nonspecific but probably reflect moderate to advanced chronic microvascular ischemic changes. There is bilateral cerebral convexity dural thickening or minimal residual chronic subdural collections. Prominence of the ventricles and sulci reflects mild generalized parenchymal volume loss. There is no hydrocephalus or extra-axial fluid collection. Vascular: Major vessel flow voids at the skull base are preserved. Skull and upper cervical spine: Normal marrow signal is preserved. Sinuses/Orbits: Paranasal sinuses are  aerated. Orbits are unremarkable. Other: Sella is unremarkable.  Nonspecific mastoid effusions. IMPRESSION: Small acute to subacute right temporal infarct. Moderate to advanced chronic microvascular ischemic changes. Dural thickening versus minimal residual chronic subdural collections. Nonspecific mastoid effusions. Electronically Signed   By: Macy Mis M.D.   On: 04/20/2019 10:06   DG CHEST PORT 1 VIEW  Result Date: 04/24/2019 CLINICAL DATA:  Aspiration EXAM: PORTABLE CHEST 1 VIEW COMPARISON:  April 22, 2019 FINDINGS: There is a well-positioned tunneled dialysis catheter on the right. The lung volumes are slightly hyperexpanded. The heart size is stable but enlarged. There are small bilateral pleural effusions with adjacent airspace disease favored to represent atelectasis. There is no pneumothorax. No new large focal airspace opacity concerning for aspiration. IMPRESSION: Essentially stable appearance of the chest without evidence for aspiration. Electronically Signed   By: Constance Holster M.D.   On: 04/24/2019 14:57   DG CHEST PORT 1 VIEW  Result Date: 04/22/2019 CLINICAL DATA:  Cough EXAM: PORTABLE CHEST 1 VIEW COMPARISON:  04/19/2019 FINDINGS: Single frontal view of the chest demonstrates stable right internal jugular dialysis catheter. Cardiac silhouette is unchanged. Continued ectasia and atherosclerosis of the thoracic aorta. Diffuse interstitial prominence unchanged since prior exam. Decreased right pleural effusion. No pneumothorax. IMPRESSION: 1. Diffuse interstitial prominence consistent with chronic interstitial lung disease. 2. Decreased right pleural effusion. Electronically Signed   By: Randa Ngo M.D.   On: 04/22/2019 15:01   DG Chest Portable 1 View  Result Date: 04/19/2019 CLINICAL DATA:  Altered mental status EXAM: PORTABLE CHEST 1 VIEW COMPARISON:  April 15, 2019 FINDINGS: There is unchanged cardiomegaly, the patient is slightly rotated. Aortic calcifications are seen. A  right-sided central venous catheter with the tip just entering the right atrium. Again noted are mildly increased interstitial markings throughout both lungs, not significantly changed since the prior exam. There is also stable right basilar small pleural effusion. No acute osseous abnormality. IMPRESSION: Unchanged mildly increased interstitial markings throughout both lungs which could be due to chronic lung changes or mild interstitial edema. Trace right pleural effusion. Electronically Signed   By: Prudencio Pair M.D.   On: 04/19/2019 22:48   DG Chest Port 1 View  Result Date: 04/15/2019 CLINICAL DATA:  Possible aspiration. EXAM: PORTABLE CHEST 1 VIEW COMPARISON:  March 11, 2019 FINDINGS: The study is limited secondary to patient rotation. There is stable right-sided venous catheter positioning. Mild, chronic appearing increased lung markings are noted. This is seen on the prior study. Mild, stable left basilar atelectasis is present. There is no evidence of a pleural effusion or pneumothorax. The cardiac silhouette is mildly enlarged and unchanged in size. Degenerative changes seen throughout the thoracic spine. IMPRESSION: Stable cardiomegaly without evidence of active disease. Electronically Signed   By: Virgina Norfolk M.D.   On: 04/15/2019 23:48   ECHOCARDIOGRAM COMPLETE  Result Date: 04/21/2019    ECHOCARDIOGRAM REPORT   Patient Name:  Joan Mosley Date of Exam: 04/21/2019 Medical Rec #:  [id number]      Height:       61.0 in Accession #:    [id number]     Weight:       188.3 lb Date of Birth:  [birth year]      BSA:          1.841 m Patient Age:    33 years       BP:           128/82 mmHg Patient Gender: F              HR:           108 bpm. Exam Location:  Inpatient Procedure: 2D Echo, Cardiac Doppler and Color Doppler Indications:    Stroke 434.91 / I163.9  History:        Patient has prior history of Echocardiogram examinations, most                 recent 09/16/2018. TIA and COPD; Risk  Factors:Diabetes. Thyroid                 disease.  Sonographer:    Jonelle Sidle Dance Referring Phys: [id number] KAREN M BLACK  Sonographer Comments: Technically difficult study due to poor echo windows and suboptimal parasternal window. IMPRESSIONS  1. Left ventricular ejection fraction, by estimation, is 60 to 65%. The left ventricle has normal function. Left ventricular endocardial border not optimally defined to evaluate regional wall motion. Indeterminate diastolic filling due to E-A fusion.  2. Right ventricular systolic function is normal. The right ventricular size is normal. Tricuspid regurgitation signal is inadequate for assessing PA pressure.  3. The mitral valve is degenerative. No evidence of mitral valve regurgitation. No evidence of mitral stenosis.  4. The aortic valve is grossly normal. Aortic valve regurgitation is not visualized. No aortic stenosis is present.  5. The inferior vena cava is normal in size with greater than 50% respiratory variability, suggesting right atrial pressure of 3 mmHg. Conclusion(s)/Recommendation(s): Very poor windows for TTE. Globally normal LV function but unable to exclude RWMA. FINDINGS  Left Ventricle: Left ventricular ejection fraction, by estimation, is 60 to 65%. The left ventricle has normal function. Left ventricular endocardial border not optimally defined to evaluate regional wall motion. The left ventricular internal cavity size was normal in size. There is no left ventricular hypertrophy. Indeterminate diastolic filling due to E-A fusion. Right Ventricle: The right ventricular size is normal. No increase in right ventricular wall thickness. Right ventricular systolic function is normal. Tricuspid regurgitation signal is inadequate for assessing PA pressure. Left Atrium: Left atrial size was normal in size. Right Atrium: Right atrial size was normal in size. Pericardium: A small pericardial effusion is present. The pericardial effusion is anterior to the right  ventricle. Mitral Valve: The mitral valve is degenerative in appearance. There is mild thickening of the mitral valve leaflet(s). There is mild calcification of the mitral valve leaflet(s). Mild to moderate mitral annular calcification. No evidence of mitral valve regurgitation. No evidence of mitral valve stenosis. Tricuspid Valve: The tricuspid valve is grossly normal. Tricuspid valve regurgitation is trivial. No evidence of tricuspid stenosis. Aortic Valve: The aortic valve is grossly normal. Aortic valve regurgitation is not visualized. No aortic stenosis is present. Pulmonic Valve: The pulmonic valve was grossly normal. Pulmonic valve regurgitation is not visualized. Aorta: The aortic root is normal in size and structure. Venous: The inferior vena cava is normal in size with  greater than 50% respiratory variability, suggesting right atrial pressure of 3 mmHg. IAS/Shunts: The atrial septum is grossly normal.  LEFT VENTRICLE PLAX 2D LVIDd:         4.00 cm LVIDs:         3.40 cm LV PW:         1.00 cm LV IVS:        1.10 cm LVOT diam:     1.90 cm LV SV:         36 LV SV Index:   20 LVOT Area:     2.84 cm  RIGHT VENTRICLE            IVC RV Basal diam:  2.30 cm    IVC diam: 1.55 cm RV S prime:     9.90 cm/s TAPSE (M-mode): 1.3 cm LEFT ATRIUM             Index       RIGHT ATRIUM          Index LA diam:        3.50 cm 1.90 cm/m  RA Area:     8.22 cm LA Vol (A2C):   40.2 ml 21.84 ml/m RA Volume:   14.50 ml 7.88 ml/m LA Vol (A4C):   41.7 ml 22.65 ml/m LA Biplane Vol: 42.7 ml 23.20 ml/m  AORTIC VALVE LVOT Vmax:   78.70 cm/s LVOT Vmean:  47.650 cm/s LVOT VTI:    0.127 m  AORTA Ao Root diam: 2.70 cm MITRAL VALVE MV Area (PHT): 5.46 cm     SHUNTS MV Decel Time: 139 msec     Systemic VTI:  0.13 m MV E velocity: 106.00 cm/s  Systemic Diam: 1.90 cm Eleonore Chiquito MD Electronically signed by Eleonore Chiquito MD Signature Date/Time: 04/21/2019/12:31:36 PM    Final       Subjective: No new complaints, eating  breakfast.  Discharge Exam: Vitals:   04/25/19 0733 04/25/19 0910  BP: 139/76   Pulse: 90   Resp: (!) 21   Temp: 97.6 F (36.4 C)   SpO2: 97% 97%   Vitals:   04/25/19 0500 04/25/19 0600 04/25/19 0733 04/25/19 0910  BP:   139/76   Pulse:   90   Resp: 16 18 (!) 21   Temp:   97.6 F (36.4 C)   TempSrc:   Axillary   SpO2: 99% 97% 97% 97%  Weight:      Height:        General: Pt is alert, awake, not in acute distress Cardiovascular: RRR, S1/S2 +, no rubs, no gallops Respiratory: diminished at bases.  Abdominal: Soft, NT, ND, bowel sounds + Extremities: no edema, no cyanosis    The results of significant diagnostics from this hospitalization (including imaging, microbiology, ancillary and laboratory) are listed below for reference.     Microbiology: Recent Results (from the past 240 hour(s))  SARS CORONAVIRUS 2 (TAT 6-24 HRS) Nasopharyngeal Nasopharyngeal Swab     Status: None   Collection Time: 04/15/19 10:55 PM   Specimen: Nasopharyngeal Swab  Result Value Ref Range Status   SARS Coronavirus 2 NEGATIVE NEGATIVE Final    Comment: (NOTE) SARS-CoV-2 target nucleic acids are NOT DETECTED. The SARS-CoV-2 RNA is generally detectable in upper and lower respiratory specimens during the acute phase of infection. Negative results do not preclude SARS-CoV-2 infection, do not rule out co-infections with other pathogens, and should not be used as the sole basis for treatment or other patient management decisions. Negative results  must be combined with clinical observations, patient history, and epidemiological information. The expected result is Negative. Fact Sheet for Patients: SugarRoll.be Fact Sheet for Healthcare Providers: https://www.woods-mathews.com/ This test is not yet approved or cleared by the [place] FDA and  has been authorized for detection and/or diagnosis of SARS-CoV-2 by FDA under an Emergency Use Authorization  (EUA). This EUA will remain  in effect (meaning this test can be used) for the duration of the COVID-19 declaration under Section 56 4(b)(1) of the Act, 21 U.S.C. section 360bbb-3(b)(1), unless the authorization is terminated or revoked sooner. Performed at [facility], [place] [address] [place], [place] [address]   Respiratory Panel by RT PCR (Flu A&B, Covid) - Nasopharyngeal Swab     Status: None   Collection Time: 04/24/19  6:33 PM   Specimen: Nasopharyngeal Swab  Result Value Ref Range Status   SARS Coronavirus 2 by RT PCR NEGATIVE NEGATIVE Final    Comment: (NOTE) SARS-CoV-2 target nucleic acids are NOT DETECTED. The SARS-CoV-2 RNA is generally detectable in upper respiratoy specimens during the acute phase of infection. The lowest concentration of SARS-CoV-2 viral copies this assay can detect is 131 copies/mL. A negative result does not preclude SARS-Cov-2 infection and should not be used as the sole basis for treatment or other patient management decisions. A negative result may occur with  improper specimen collection/handling, submission of specimen other than nasopharyngeal swab, presence of viral mutation(s) within the areas targeted by this assay, and inadequate number of viral copies (<131 copies/mL). A negative result must be combined with clinical observations, patient history, and epidemiological information. The expected result is Negative. Fact Sheet for Patients:  PinkCheek.be Fact Sheet for Healthcare Providers:  GravelBags.it This test is not yet ap proved or cleared by the [place] FDA and  has been authorized for detection and/or diagnosis of SARS-CoV-2 by FDA under an Emergency Use Authorization (EUA). This EUA will remain  in effect (meaning this test can be used) for the duration of the COVID-19 declaration under Section 564(b)(1) of the Act, 21 U.S.C. section 360bbb-3(b)(1), unless the  authorization is terminated or revoked sooner.    Influenza A by PCR NEGATIVE NEGATIVE Final   Influenza B by PCR NEGATIVE NEGATIVE Final    Comment: (NOTE) The Xpert Xpress SARS-CoV-2/FLU/RSV assay is intended as an aid in  the diagnosis of influenza from Nasopharyngeal swab specimens and  should not be used as a sole basis for treatment. Nasal washings and  aspirates are unacceptable for Xpert Xpress SARS-CoV-2/FLU/RSV  testing. Fact Sheet for Patients: PinkCheek.be Fact Sheet for Healthcare Providers: GravelBags.it This test is not yet approved or cleared by the [place] FDA and  has been authorized for detection and/or diagnosis of SARS-CoV-2 by  FDA under an Emergency Use Authorization (EUA). This EUA will remain  in effect (meaning this test can be used) for the duration of the  Covid-19 declaration under Section 564(b)(1) of the Act, 21  U.S.C. section 360bbb-3(b)(1), unless the authorization is  terminated or revoked. Performed at [facility], [place] [address], [place], [place] [address]      Labs: BNP (last 3 results) Recent Labs    09/09/18 0031 11/10/18 1706 02/09/19 1946  BNP 312.3* 157.1* 177.9*   Basic Metabolic Panel: Recent Labs  Lab 04/19/19 2226 04/20/19 0558 04/21/19 0410 04/22/19 0453 04/24/19 0838  NA 141 140 140 139 137  K 4.0 3.8 4.2 4.0 3.8  CL 96* 97* 98 100 97*  CO2 27 28 25 25 25   GLUCOSE 136* 129* 113* 125* 157*  BUN 37* 38* 11 19 21   CREATININE 7.69* 7.61* 3.65* 4.98* 5.22*  CALCIUM 9.6 9.6 9.6 9.8 10.0  PHOS  --   --   --   --  3.7   Liver Function Tests: Recent Labs  Lab 04/19/19 2226 04/20/19 0558 04/24/19 0838  AST 47* 48*  --   ALT 15 15  --   ALKPHOS 107 108  --   BILITOT 1.1 0.9  --   PROT 6.2* 6.2*  --   ALBUMIN 2.7* 2.6* 2.4*   No results for input(s): LIPASE, AMYLASE in the last 168 hours. Recent Labs  Lab 04/21/19 1323  AMMONIA 35    CBC: Recent Labs  Lab 04/19/19 2226 04/20/19 0558 04/22/19 0453  WBC 9.8 9.7 12.1*  NEUTROABS 7.1 7.2  --   HGB 11.0* 11.1* 10.1*  HCT 37.4 37.5 33.5*  MCV 101.4* 102.2* 101.8*  PLT 181 191 163   Cardiac Enzymes: No results for input(s): CKTOTAL, CKMB, CKMBINDEX, TROPONINI in the last 168 hours. BNP: Invalid input(s): POCBNP CBG: Recent Labs  Lab 04/24/19 0644 04/24/19 1217 04/24/19 1627 04/24/19 2103 04/25/19 0612  GLUCAP 114* 118* 130* 104* 120*   D-Dimer No results for input(s): DDIMER in the last 72 hours. Hgb A1c No results for input(s): HGBA1C in the last 72 hours. Lipid Profile No results for input(s): CHOL, HDL, LDLCALC, TRIG, CHOLHDL, LDLDIRECT in the last 72 hours. Thyroid function studies No results for input(s): TSH, T4TOTAL, T3FREE, THYROIDAB in the last 72 hours.  Invalid input(s): FREET3 Anemia work up No results for input(s): VITAMINB12, FOLATE, FERRITIN, TIBC, IRON, RETICCTPCT in the last 72 hours. Urinalysis    Component Value Date/Time   COLORURINE YELLOW 04/20/2018 0301   APPEARANCEUR HAZY (A) 04/20/2018 0301   LABSPEC 1.009 04/20/2018 0301   PHURINE 9.0 (H) 04/20/2018 0301   GLUCOSEU 150 (A) 04/20/2018 0301   HGBUR MODERATE (A) 04/20/2018 0301   BILIRUBINUR NEGATIVE 04/20/2018 0301   KETONESUR NEGATIVE 04/20/2018 0301   PROTEINUR 100 (A) 04/20/2018 0301   NITRITE NEGATIVE 04/20/2018 0301   LEUKOCYTESUR NEGATIVE 04/20/2018 0301   Sepsis Labs Invalid input(s): PROCALCITONIN,  WBC,  LACTICIDVEN Microbiology Recent Results (from the past 240 hour(s))  SARS CORONAVIRUS 2 (TAT 6-24 HRS) Nasopharyngeal Nasopharyngeal Swab     Status: None   Collection Time: 04/15/19 10:55 PM   Specimen: Nasopharyngeal Swab  Result Value Ref Range Status   SARS Coronavirus 2 NEGATIVE NEGATIVE Final    Comment: (NOTE) SARS-CoV-2 target nucleic acids are NOT DETECTED. The SARS-CoV-2 RNA is generally detectable in upper and lower respiratory specimens  during the acute phase of infection. Negative results do not preclude SARS-CoV-2 infection, do not rule out co-infections with other pathogens, and should not be used as the sole basis for treatment or other patient management decisions. Negative results must be combined with clinical observations, patient history, and epidemiological information. The expected result is Negative. Fact Sheet for Patients: SugarRoll.be Fact Sheet for Healthcare Providers: https://www.woods-mathews.com/ This test is not yet approved or cleared by the [place] FDA and  has been authorized for detection and/or diagnosis of SARS-CoV-2 by FDA under an Emergency Use Authorization (EUA). This EUA will remain  in effect (meaning this test can be used) for the duration of the COVID-19 declaration under Section 56 4(b)(1) of the Act, 21 U.S.C. section 360bbb-3(b)(1), unless the authorization is terminated or revoked sooner. Performed at [facility], [place] [address] [place], [place] [address]   Respiratory Panel by RT PCR (Flu A&B, Covid) - Nasopharyngeal Swab     Status: None   Collection Time: 04/24/19  6:33 PM   Specimen: Nasopharyngeal Swab  Result Value Ref Range Status   SARS Coronavirus 2 by RT PCR NEGATIVE NEGATIVE Final    Comment: (NOTE) SARS-CoV-2 target nucleic acids are NOT DETECTED. The SARS-CoV-2 RNA is generally detectable in upper respiratoy specimens during the acute phase of infection. The lowest concentration of SARS-CoV-2 viral copies this assay can detect is 131 copies/mL. A negative result does not preclude SARS-Cov-2 infection and should not be used as the sole basis for treatment or other patient management decisions. A negative result may occur with  improper specimen collection/handling, submission of specimen other than nasopharyngeal swab, presence of viral mutation(s) within the areas targeted by this assay, and inadequate number  of viral copies (<131 copies/mL). A negative result must be combined with clinical observations, patient history, and epidemiological information. The expected result is Negative. Fact Sheet for Patients:  PinkCheek.be Fact Sheet for Healthcare Providers:  GravelBags.it This test is not yet ap proved or cleared by the [place] FDA and  has been authorized for detection and/or diagnosis of SARS-CoV-2 by FDA under an Emergency Use Authorization (EUA). This EUA will remain  in effect (meaning this test can be used) for the duration of the COVID-19 declaration under Section 564(b)(1) of the Act, 21 U.S.C. section 360bbb-3(b)(1), unless the authorization is terminated or revoked sooner.    Influenza A by PCR NEGATIVE NEGATIVE Final   Influenza B by PCR NEGATIVE NEGATIVE Final    Comment: (NOTE) The Xpert Xpress SARS-CoV-2/FLU/RSV assay is intended as an aid in  the diagnosis of influenza from Nasopharyngeal swab specimens and  should not be used as a sole basis for treatment. Nasal washings and  aspirates are unacceptable for Xpert Xpress SARS-CoV-2/FLU/RSV  testing. Fact Sheet for Patients: PinkCheek.be Fact Sheet for Healthcare Providers: GravelBags.it This test is not yet approved or cleared by the [place] FDA and  has been authorized for detection and/or diagnosis of SARS-CoV-2 by  FDA under an Emergency Use Authorization (EUA). This EUA will remain  in effect (meaning this test can be used) for the duration of the  Covid-19 declaration under Section 564(b)(1) of the Act, 21  U.S.C. section 360bbb-3(b)(1), unless the authorization is  terminated or revoked. Performed at [facility], [place] [address] [place], [place] [address]      Time coordinating discharge: 32 minutes  SIGNED:   Hosie Poisson, MD  [facility] 04/25/2019, 9:43 AM

## 2019-04-25 NOTE — Progress Notes (Addendum)
Nephrology Progress Note:   47F ESRD with AMS, small ischemic CVA, progressive decline  Subjective:  Last HD on 3/27 with 2 kg UF.  post weight 83.4kg.  Spoke with nursing and pt on 4-5 liters oxygen at home.  Feels ok.  On bipap overnight.  Pall care was consulted and rec palliative care outpatient follow-up - she does still wish to be full code.  Had a coughing fit yesterday after lunch and was on increased oxygen support.  Review of systems:   Denies shortness of breath or chest pain Denies nausea or vomiting  03/29 0701 - 03/30 0700 In: 820 [P.O.:820] Out: -   Filed Weights   04/22/19 8676 04/22/19 1111 04/25/19 0321  Weight: 85.8 kg 83.4 kg 90.5 kg    Scheduled Meds: .  stroke: mapping our early stages of recovery book   Does not apply Once  . aspirin EC  81 mg Oral Daily  . atorvastatin  40 mg Oral q1800  . Chlorhexidine Gluconate Cloth  6 each Topical Q0600  . Chlorhexidine Gluconate Cloth  6 each Topical Q0600  . doxercalciferol  3 mcg Intravenous Q T,Th,Sa-HD  . FLUoxetine  10 mg Oral Daily  . heparin  5,000 Units Subcutaneous Q8H  . insulin aspart  0-6 Units Subcutaneous TID WC  . levothyroxine  112 mcg Oral Q0600  . midodrine  10 mg Oral Q T,Th,Sa-HD  . sodium chloride flush  3 mL Intravenous Q12H  . sodium chloride flush  3 mL Intravenous Q12H   Continuous Infusions: . sodium chloride    . sodium chloride 10 mL/hr at 04/20/19 1759   PRN Meds:.sodium chloride, acetaminophen **OR** acetaminophen, ipratropium-albuterol, ondansetron **OR** ondansetron (ZOFRAN) IV, senna-docusate, sevelamer carbonate, sodium chloride flush  Current Labs: reviewed    Physical Exam:  Blood pressure 116/61, pulse 94, temperature 98.3 F (36.8 C), temperature source Axillary, resp. rate 18, height 5\' 1"  (1.549 m), weight 90.5 kg, SpO2 97 %.  Gen Chronically ill-appearing, NAD  On bipap HEENT NCAT Heart - S1 and S2; no rub Lungs - Clear but reduced breath sounds bilaterally Abd  soft and nondistended, nontender Extremities no to trace edema lower extremities Neuro - very hard of hearing; able to tell me her name and year after 3 attempts (may be related to hearing) Access Right IJ TDC   Assessment/Plan: 1. new acute/subacute infarct - per primary and neuro, med mgmt 2. ESRD -  HD per TTS schedule. Increased UF goal and has midodrine with HD if needed. 3. hypotension  - midodrine for BP support - 10 mg pre and 10 mg mid for SBP < 95.  UF as able  4. Anemia  - hgb 10.1 - stable last check - no ESA for now. Also with recent infarct  5. Metabolic bone disease -  Continue Hectorol - resume binders when consistently taking po; phos acceptable on current regimen 6. Nutrition -plus protein 7. GOC - need to revisit given dementia, recurrent CVA, poor functional status requires PTAR transportation to HD; appreciate palliative consult and note rec for outpatient palliative services on discharge; she is full code per their note 8. DM/hypothyroidism-dysphagia 3 diet 9. COPD -chronic hypoxic respiratory failure/o2 dependence. Optimize UF with HD. Note patient on 4-5 liters oxygen at home per nursing 10. Anxiety/depression 11. FTT/debility: For SNF at discharge  Disposition per primary team     Recent Labs  Lab 04/21/19 0410 04/22/19 0453 04/24/19 0838  NA 140 139 137  K 4.2 4.0 3.8  CL 98 100 97*  CO2 25 25 25   GLUCOSE 113* 125* 157*  BUN 11 19 21   CREATININE 3.65* 4.98* 5.22*  CALCIUM 9.6 9.8 10.0  PHOS  --   --  3.7   Recent Labs  Lab 04/19/19 2226 04/20/19 0558 04/22/19 0453  WBC 9.8 9.7 12.1*  NEUTROABS 7.1 7.2  --   HGB 11.0* 11.1* 10.1*  HCT 37.4 37.5 33.5*  MCV 101.4* 102.2* 101.8*  PLT 181 191 163     Claudia Desanctis 04/25/2019 7:36 AM

## 2019-04-26 DIAGNOSIS — R627 Adult failure to thrive: Secondary | ICD-10-CM

## 2019-04-26 LAB — GLUCOSE, CAPILLARY
Glucose-Capillary: 138 mg/dL — ABNORMAL HIGH (ref 70–99)
Glucose-Capillary: 108 mg/dL — ABNORMAL HIGH (ref 70–99)

## 2019-04-26 LAB — RENAL FUNCTION PANEL
Albumin: 3.2 g/dL — ABNORMAL LOW (ref 3.5–5.0)
Anion gap: 19 — ABNORMAL HIGH (ref 5–15)
BUN: 9 mg/dL (ref 8–23)
CO2: 23 mmol/L (ref 22–32)
Calcium: 9.5 mg/dL (ref 8.9–10.3)
Chloride: 95 mmol/L — ABNORMAL LOW (ref 98–111)
Creatinine, Ser: 3.21 mg/dL — ABNORMAL HIGH (ref 0.44–1.00)
GFR calc Af Amer: 16 mL/min — ABNORMAL LOW (ref >60)
GFR calc non Af Amer: 13 mL/min — ABNORMAL LOW (ref >60)
Glucose, Bld: 144 mg/dL — ABNORMAL HIGH (ref 70–99)
Phosphorus: 2.7 mg/dL (ref 2.5–4.6)
Potassium: 3.4 mmol/L — ABNORMAL LOW (ref 3.5–5.1)
Sodium: 137 mmol/L (ref 135–145)

## 2019-04-26 NOTE — Progress Notes (Signed)
Patient placed back on oxygen at 5 liters nasal canula.

## 2019-04-26 NOTE — TOC Transition Note (Signed)
Transition of Care [facility]) - CM/SW Discharge Note   Patient Details  Name: Joan Mosley MRN: [id number] Date of Birth: 1945/08/06  [facility]) CM/SW Contact:  Geralynn Ochs, LCSW Phone Number: 04/26/2019, 12:29 PM   Clinical Narrative:   Nurse to call report to [phone], Room 317    Final next level of care: Skilled Nursing Facility Barriers to Discharge: Barriers Resolved   Patient Goals and CMS Choice Patient states their goals for this hospitalization and ongoing recovery are:: patient unable to state goals due to disorientation CMS Medicare.gov Compare Post Acute Care list provided to:: Patient Represenative (must comment) Choice offered to / list presented to : [place] / [place]  Discharge Placement              Patient chooses bed at: [facility] Patient to be transferred to facility by: [place] Name of family member notified: Marvia Pickles Patient and family notified of of transfer: 04/26/19  Discharge Plan and Services     Post Acute Care Choice: [facility]                               Social Determinants of Health (SDOH) Interventions     Readmission Risk Interventions Readmission Risk Prevention Plan 10/04/2018  Transportation Screening Complete  Medication Review Press photographer) Complete  PCP or Specialist appointment within 3-5 days of discharge Complete  HRI or [place] Complete  SW Recovery Care/Counseling Consult Complete  Palliative Care Screening Complete  [place] Patient Refused

## 2019-04-26 NOTE — Progress Notes (Signed)
PPV in use at this time.  HR 102 and saturation 100%

## 2019-04-26 NOTE — Progress Notes (Addendum)
Nephrology Progress Note:   35F ESRD with AMS, small ischemic CVA, progressive decline  Subjective:  Last HD on 3/30 with 2.6 kg UF.  Weight is charted as 80.2 kg - unsure if accurate as lower than prior.  (post weight 83.4 after previous treatment). Spoke with nursing and pt on 4-5 liters oxygen at baseline  RN reports weight today was with extra covers and pillows off bed and they aren't sure about previous weights.  Review of systems:   Denies shortness of breath or chest pain Denies nausea or vomiting  03/30 0701 - 03/31 0700 In: 480 [P.O.:480] Out: 2608   Filed Weights   04/25/19 1825 04/25/19 2226 04/26/19 0348  Weight: 84.5 kg 82 kg 80.2 kg    Scheduled Meds: .  stroke: mapping our early stages of recovery book   Does not apply Once  . aspirin EC  81 mg Oral Daily  . atorvastatin  40 mg Oral q1800  . Chlorhexidine Gluconate Cloth  6 each Topical Q0600  . Chlorhexidine Gluconate Cloth  6 each Topical Q0600  . doxercalciferol  3 mcg Intravenous Q T,Th,Sa-HD  . FLUoxetine  10 mg Oral Daily  . heparin  5,000 Units Subcutaneous Q8H  . insulin aspart  0-6 Units Subcutaneous TID WC  . levothyroxine  112 mcg Oral Q0600  . midodrine  10 mg Oral Q T,Th,Sa-HD  . sodium chloride flush  3 mL Intravenous Q12H  . sodium chloride flush  3 mL Intravenous Q12H   Continuous Infusions: . sodium chloride    . sodium chloride 10 mL/hr at 04/20/19 1759   PRN Meds:.sodium chloride, acetaminophen **OR** acetaminophen, ipratropium-albuterol, ondansetron **OR** ondansetron (ZOFRAN) IV, senna-docusate, sevelamer carbonate, sodium chloride flush  Current Labs: reviewed    Physical Exam:  Blood pressure 92/60, pulse (!) 105, temperature 97.6 F (36.4 C), temperature source Oral, resp. rate 20, height 5\' 1"  (1.549 m), weight 80.2 kg, SpO2 93 %.  Gen Chronically ill-appearing, NAD   HEENT NCAT Heart - S1 and S2; no rub Lungs - Clear but reduced breath sounds bilaterally Abd soft and  nondistended, nontender Extremities no edema lower extremities Neuro - very hard of hearing but conversant when questions repeated Access Right IJ TDC   Assessment/Plan: 1. new acute/subacute infarct - per primary and neuro, med mgmt 2. ESRD -  HD per TTS schedule. Increased UF goal and has midodrine with HD if needed.  Can give lasix 80 mg daily on non HD days (see that lasix 80 mg is on discharge summary but no dosing frequency).  Nursing is going to weigh again to see if the second weight from today matches the first.  EDW may be between 81-82 kg as tolerated but will need weight lowered from 88 kg (her previous EDW) 3. hypotension  - midodrine for BP support - 10 mg pre and 10 mg mid for SBP < 95.  UF as able  4. Anemia  - hgb 10.1 - stable last check - no ESA for now. Also with recent infarct  5. Metabolic bone disease -  Continue Hectorol - resume binders when consistently taking po; phos acceptable on current regimen 6. Nutrition -plus protein 7. GOC - need to revisit given dementia, recurrent CVA, poor functional status requires PTAR transportation to HD; appreciate palliative consult and note rec for outpatient palliative services on discharge; she is full code per their note 8. COPD -chronic hypoxic respiratory failure/o2 dependence. Optimize UF with HD. Note patient on 4-5 liters oxygen at  home per nursing 9. Anxiety/depression 10. FTT/debility: For SNF at discharge  Discharge is planned for today per nursing report.  Was delayed for her to get HD here.    Recent Labs  Lab 04/24/19 0838 04/25/19 0901 04/26/19 0332  NA 137 138 137  K 3.8 4.5 3.4*  CL 97* 96* 95*  CO2 25 25 23   GLUCOSE 157* 112* 144*  BUN 21 32* 9  CREATININE 5.22* 6.44* 3.21*  CALCIUM 10.0 9.8 9.5  PHOS 3.7 4.5 2.7   Recent Labs  Lab 04/19/19 2226 04/20/19 0558 04/22/19 0453  WBC 9.8 9.7 12.1*  NEUTROABS 7.1 7.2  --   HGB 11.0* 11.1* 10.1*  HCT 37.4 37.5 33.5*  MCV 101.4* 102.2* 101.8*  PLT 181  191 163     Claudia Desanctis 04/26/2019 7:28 AM

## 2019-04-26 NOTE — Progress Notes (Signed)
Pt being discharged to [facility], report given to [facility]. PTAR providing transportation, belongings returned to patient

## 2019-04-26 NOTE — Progress Notes (Signed)
Discharge summary was done by Dr. Karleen Hampshire on March 30.  Discharge to skilled nursing facility was delayed due to dialysis. Patient had uneventful night, she is stable to discharge to skilled nursing facility today.  Of note speech therapist recommended DG esophageal study to evaluate esophageal swallowing.  Palliative care also recommended palliative care continue to follow patient at skilled nursing facility.

## 2019-04-27 ENCOUNTER — Non-Acute Institutional Stay (SKILLED_NURSING_FACILITY): Payer: Medicare Other | Admitting: Internal Medicine

## 2019-04-27 ENCOUNTER — Encounter: Payer: Self-pay | Admitting: Internal Medicine

## 2019-04-27 DIAGNOSIS — J449 Chronic obstructive pulmonary disease, unspecified: Secondary | ICD-10-CM

## 2019-04-27 DIAGNOSIS — E1142 Type 2 diabetes mellitus with diabetic polyneuropathy: Secondary | ICD-10-CM

## 2019-04-27 DIAGNOSIS — N186 End stage renal disease: Secondary | ICD-10-CM | POA: Diagnosis not present

## 2019-04-27 DIAGNOSIS — R4189 Other symptoms and signs involving cognitive functions and awareness: Secondary | ICD-10-CM

## 2019-04-27 DIAGNOSIS — I639 Cerebral infarction, unspecified: Secondary | ICD-10-CM

## 2019-04-27 DIAGNOSIS — Z794 Long term (current) use of insulin: Secondary | ICD-10-CM

## 2019-04-27 DIAGNOSIS — R29818 Other symptoms and signs involving the nervous system: Secondary | ICD-10-CM

## 2019-04-27 DIAGNOSIS — Z992 Dependence on renal dialysis: Secondary | ICD-10-CM

## 2019-04-27 DIAGNOSIS — Z789 Other specified health status: Secondary | ICD-10-CM

## 2019-04-27 NOTE — Assessment & Plan Note (Signed)
On exam she answered with monosyllabic "no" to all answers.  Veracity of the review of systems is in question.  She gave the date as "December 01, 2019".

## 2019-04-27 NOTE — Assessment & Plan Note (Signed)
Low-dose aspirin prophylaxis will be continued.

## 2019-04-27 NOTE — Patient Instructions (Signed)
See assessment and plan under each diagnosis in the problem list and acutely for this visit 

## 2019-04-27 NOTE — Assessment & Plan Note (Signed)
Adequate oxygenation at this time.  Physical exhibits "silent" lungs as expected with advanced COPD.

## 2019-04-27 NOTE — Assessment & Plan Note (Addendum)
Insulin is not listed among her current medications which is appropriate based on her random glucoses and A1c's. In June 2020 A1c was 6.1; in March of this year it was 5.4. Fasting glucose 149; p.m. glucose 151.  Tight diabetic control must be avoided.  It is critical to avoid hypoglycemia as this would mimic TIA or recurrent stroke.

## 2019-04-27 NOTE — Assessment & Plan Note (Signed)
I am seeing the patient for the first time.  In addition to advanced age she has advanced, end-stage comorbidities.  The likelihood of her even surviving CPR would be approximately 10%.  Were she to survive this invasive intervention, she would most likely be left with multiple fractured ribs and terminal status.  Palliative care did consult while she was hospitalized 3/24-3/30/2021.  I will ask her PCP to discuss these issues with her family.

## 2019-04-27 NOTE — Assessment & Plan Note (Signed)
During the recent hospitalization 3/24-3/30/2021 for acute stroke, creatinine varied from 7.69-2.39.  GFR varied from 22-5.

## 2019-04-27 NOTE — Progress Notes (Signed)
NURSING HOME LOCATION:  [facility] ROOM NUMBER:  [id number]  CODE STATUS:  FULL CODE  PCP:  Flossie Buffy, [place] [address]  This is a comprehensive admission note to [facility] performed on this date less than 30 days from date of admission. Included are preadmission medical/surgical history; reconciled medication list; family history; social history and comprehensive review of systems.  Corrections and additions to the records were documented. Comprehensive physical exam was also performed. Additionally a clinical summary was entered for each active diagnosis pertinent to this admission in the Problem List to enhance continuity of care.  HPI: This 74 year old female was hospitalized 3/24-3/30/2021, admitted from home with altered mental status in the context of a history of dementia.  This was in the context of having been admitted in January for acute respiratory failure secondary to COVID-19. CT did not show any acute stroke but MRI revealed small acute to subacute right temporal infarct.  Also moderate-advanced chronic microvascular ischemic changes were present.  MRI of the head neck revealed mild atheromatous irregularity at the carotid bifurcations without hemodynamically significant stenosis.  Echo revealed ejection fraction of 60 to 65%.  Clinically she was felt to have diastolic dysfunction. During the hospitalization she exhibited increased work of breathing; stat chest x-ray revealed diffuse interstitial changes consistent with chronic interstitial lung disease.  Also previously noted pleural effusion on the right had decreased.  This was in the context of a history of COPD. Nephrology and neurology consulted.  Neurology recommended continuing 81 mg of aspirin daily as prophylaxis.  The patient has ESRD and is on HD; nephrology recommended palliative care consult, but the family wanted to pursue all available medical interventions to  prolong life, hence she is listed as a full code. She was discharged to the SNF for PT/OT due to her extremely poor functional status and generalized deconditioning.  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 Apparently she has had over three fourths of her thyroid removed.  Social history: 35-pack-year history of smoking.  Nondrinker.  Family history: Strong family history of heart disease.   Review of systems:  Could not be completed due to dementia. Date given as "December 01, 2019".  All answers were a whispered "no".  Specifically she denied shortness of breath despite her advanced, oxygen dependent COPD.  She had great difficulty following commands.  Physical exam:  Pertinent or positive findings: She appears chronically ill.  Morbid obesity is present.  She has marked bilateral ptosis, slightly greater on the left.  The maxilla is edentulous.  She has few remaining mandibular teeth which are densely coated with plaque.She is wearing nasal oxygen.  There is marked excess inframandibular fatty tissue anteriorly.  There is also hirsutism of the chin.  Heart sounds are profoundly distant and difficult to auscultate.  Breath sounds are also markedly distant.  Abdomen is protuberant.  Pedal pulses were not palpable.  She had slight hyperpigmented exfoliative changes over the feet.  Toenail health was poor with discoloration & deformities. There was a constant side to side tremor of the left foot.  General appearance: no acute distress, increased work of breathing is present.   Lymphatic: No lymphadenopathy about the head, neck, axilla. Eyes: No conjunctival inflammation or lid edema is present. There is no scleral icterus. Ears:  External ear exam shows no significant lesions or  deformities.   Nose:  External nasal examination  shows no deformity or inflammation. Nasal mucosa  without lesions, exudates Neck:  No thyromegaly, masses, tenderness noted.    Heart:  No murmur, click, rub.  Lungs:  without wheezes, rhonchi, rales, rubs. Abdomen: Bowel sounds are normal.  Abdomen is soft and nontender with no organomegaly, hernias, masses. GU: Deferred  Extremities:  No cyanosis, clubbing, edema. Neurologic exam: Balance, Rhomberg, finger to nose testing could not be completed due to clinical state Skin: Warm & dry .  See clinical summary under each active problem in the Problem List with associated updated therapeutic plan

## 2019-04-28 ENCOUNTER — Other Ambulatory Visit: Payer: Self-pay | Admitting: Family

## 2019-04-28 DIAGNOSIS — F419 Anxiety disorder, unspecified: Secondary | ICD-10-CM

## 2019-04-28 LAB — HEMOGLOBIN A1C: Hemoglobin A1C: 5.5

## 2019-04-28 MED ORDER — CLONAZEPAM 0.5 MG PO TABS: ORAL_TABLET | ORAL | 0 refills | Status: DC

## 2019-04-28 NOTE — Progress Notes (Signed)
Facility Nurse Myna Hidalgo called states patient was discharged from the hospital on clonazepam 0.5 mg tablet 1/2 tablet in the morning and one tablet in the evening # 3 tablets with no refills was ordered.Nurse states medication not enough to last patient over the weekend need additional prescription called [facility] in [place].Unable to e-scribe.Quatity  # 25 Tablets to make total of 28 tablet for 14 days.

## 2019-05-03 LAB — BASIC METABOLIC PANEL
BUN: 20 (ref 4–21)
CO2: 26 — AB (ref 13–22)
Chloride: 92 — AB (ref 99–108)
Creatinine: 3.9 — AB (ref 0.5–1.1)
Glucose: 94
Potassium: 3.5 (ref 3.4–5.3)
Sodium: 137 (ref 137–147)

## 2019-05-03 LAB — CBC AND DIFFERENTIAL
HCT: 32 — AB (ref 36–46)
Hemoglobin: 10.2 — AB (ref 12.0–16.0)
Neutrophils Absolute: 7
Platelets: 134 — AB (ref 150–399)
WBC: 9.5

## 2019-05-03 LAB — COMPREHENSIVE METABOLIC PANEL
Calcium: 9.4 (ref 8.7–10.7)
GFR calc Af Amer: 15
GFR calc non Af Amer: 15

## 2019-05-03 LAB — CBC: RBC: 3.43 — AB (ref 3.87–5.11)

## 2019-05-04 ENCOUNTER — Emergency Department (HOSPITAL_COMMUNITY): Payer: Medicare Other

## 2019-05-04 ENCOUNTER — Other Ambulatory Visit: Payer: Self-pay

## 2019-05-04 ENCOUNTER — Inpatient Hospital Stay (HOSPITAL_COMMUNITY)
Admission: EM | Admit: 2019-05-04 | Discharge: 2019-05-08 | DRG: 291 | Disposition: A | Discharge status: Skilled Nursing Facility | Payer: Medicare Other | Attending: Internal Medicine | Admitting: Internal Medicine

## 2019-05-04 ENCOUNTER — Encounter (HOSPITAL_COMMUNITY): Payer: Self-pay | Admitting: Emergency Medicine

## 2019-05-04 DIAGNOSIS — I132 Hypertensive heart and chronic kidney disease with heart failure and with stage 5 chronic kidney disease, or end stage renal disease: Principal | ICD-10-CM | POA: Diagnosis present

## 2019-05-04 DIAGNOSIS — E1122 Type 2 diabetes mellitus with diabetic chronic kidney disease: Secondary | ICD-10-CM | POA: Diagnosis present

## 2019-05-04 DIAGNOSIS — Z6834 Body mass index (BMI) 34.0-34.9, adult: Secondary | ICD-10-CM

## 2019-05-04 DIAGNOSIS — N2581 Secondary hyperparathyroidism of renal origin: Secondary | ICD-10-CM | POA: Diagnosis present

## 2019-05-04 DIAGNOSIS — Z79890 Hormone replacement therapy (postmenopausal): Secondary | ICD-10-CM

## 2019-05-04 DIAGNOSIS — Z881 Allergy status to other antibiotic agents status: Secondary | ICD-10-CM

## 2019-05-04 DIAGNOSIS — N186 End stage renal disease: Secondary | ICD-10-CM | POA: Diagnosis present

## 2019-05-04 DIAGNOSIS — Z8616 Personal history of COVID-19: Secondary | ICD-10-CM

## 2019-05-04 DIAGNOSIS — E890 Postprocedural hypothyroidism: Secondary | ICD-10-CM | POA: Diagnosis present

## 2019-05-04 DIAGNOSIS — E1142 Type 2 diabetes mellitus with diabetic polyneuropathy: Secondary | ICD-10-CM | POA: Diagnosis present

## 2019-05-04 DIAGNOSIS — G4733 Obstructive sleep apnea (adult) (pediatric): Secondary | ICD-10-CM | POA: Diagnosis present

## 2019-05-04 DIAGNOSIS — F418 Other specified anxiety disorders: Secondary | ICD-10-CM | POA: Diagnosis present

## 2019-05-04 DIAGNOSIS — Z885 Allergy status to narcotic agent status: Secondary | ICD-10-CM

## 2019-05-04 DIAGNOSIS — Z515 Encounter for palliative care: Secondary | ICD-10-CM

## 2019-05-04 DIAGNOSIS — Z8249 Family history of ischemic heart disease and other diseases of the circulatory system: Secondary | ICD-10-CM

## 2019-05-04 DIAGNOSIS — E669 Obesity, unspecified: Secondary | ICD-10-CM | POA: Diagnosis present

## 2019-05-04 DIAGNOSIS — E039 Hypothyroidism, unspecified: Secondary | ICD-10-CM | POA: Diagnosis present

## 2019-05-04 DIAGNOSIS — F0390 Unspecified dementia without behavioral disturbance: Secondary | ICD-10-CM | POA: Diagnosis present

## 2019-05-04 DIAGNOSIS — K117 Disturbances of salivary secretion: Secondary | ICD-10-CM | POA: Diagnosis present

## 2019-05-04 DIAGNOSIS — D62 Acute posthemorrhagic anemia: Secondary | ICD-10-CM | POA: Diagnosis present

## 2019-05-04 DIAGNOSIS — E1151 Type 2 diabetes mellitus with diabetic peripheral angiopathy without gangrene: Secondary | ICD-10-CM | POA: Diagnosis present

## 2019-05-04 DIAGNOSIS — J449 Chronic obstructive pulmonary disease, unspecified: Secondary | ICD-10-CM | POA: Diagnosis present

## 2019-05-04 DIAGNOSIS — I5032 Chronic diastolic (congestive) heart failure: Secondary | ICD-10-CM | POA: Diagnosis present

## 2019-05-04 DIAGNOSIS — J9611 Chronic respiratory failure with hypoxia: Secondary | ICD-10-CM | POA: Diagnosis present

## 2019-05-04 DIAGNOSIS — Z992 Dependence on renal dialysis: Secondary | ICD-10-CM | POA: Diagnosis not present

## 2019-05-04 DIAGNOSIS — E785 Hyperlipidemia, unspecified: Secondary | ICD-10-CM | POA: Diagnosis present

## 2019-05-04 DIAGNOSIS — Z7189 Other specified counseling: Secondary | ICD-10-CM | POA: Diagnosis not present

## 2019-05-04 DIAGNOSIS — D631 Anemia in chronic kidney disease: Secondary | ICD-10-CM | POA: Diagnosis present

## 2019-05-04 DIAGNOSIS — Z5320 Procedure and treatment not carried out because of patient's decision for unspecified reasons: Secondary | ICD-10-CM | POA: Diagnosis not present

## 2019-05-04 DIAGNOSIS — Z880 Allergy status to penicillin: Secondary | ICD-10-CM

## 2019-05-04 DIAGNOSIS — Z87891 Personal history of nicotine dependence: Secondary | ICD-10-CM

## 2019-05-04 DIAGNOSIS — R5381 Other malaise: Secondary | ICD-10-CM

## 2019-05-04 DIAGNOSIS — D649 Anemia, unspecified: Secondary | ICD-10-CM | POA: Diagnosis present

## 2019-05-04 DIAGNOSIS — Z8673 Personal history of transient ischemic attack (TIA), and cerebral infarction without residual deficits: Secondary | ICD-10-CM

## 2019-05-04 DIAGNOSIS — Z20822 Contact with and (suspected) exposure to covid-19: Secondary | ICD-10-CM | POA: Diagnosis present

## 2019-05-04 DIAGNOSIS — E8889 Other specified metabolic disorders: Secondary | ICD-10-CM | POA: Diagnosis present

## 2019-05-04 DIAGNOSIS — Z96653 Presence of artificial knee joint, bilateral: Secondary | ICD-10-CM | POA: Diagnosis present

## 2019-05-04 DIAGNOSIS — Z794 Long term (current) use of insulin: Secondary | ICD-10-CM

## 2019-05-04 DIAGNOSIS — K8050 Calculus of bile duct without cholangitis or cholecystitis without obstruction: Secondary | ICD-10-CM | POA: Diagnosis present

## 2019-05-04 DIAGNOSIS — Z888 Allergy status to other drugs, medicaments and biological substances status: Secondary | ICD-10-CM

## 2019-05-04 DIAGNOSIS — R531 Weakness: Secondary | ICD-10-CM

## 2019-05-04 DIAGNOSIS — Z79899 Other long term (current) drug therapy: Secondary | ICD-10-CM

## 2019-05-04 DIAGNOSIS — H9190 Unspecified hearing loss, unspecified ear: Secondary | ICD-10-CM | POA: Diagnosis present

## 2019-05-04 DIAGNOSIS — D72829 Elevated white blood cell count, unspecified: Secondary | ICD-10-CM | POA: Diagnosis present

## 2019-05-04 DIAGNOSIS — Z90710 Acquired absence of both cervix and uterus: Secondary | ICD-10-CM

## 2019-05-04 DIAGNOSIS — Z91013 Allergy to seafood: Secondary | ICD-10-CM

## 2019-05-04 DIAGNOSIS — Z9981 Dependence on supplemental oxygen: Secondary | ICD-10-CM

## 2019-05-04 DIAGNOSIS — Z7982 Long term (current) use of aspirin: Secondary | ICD-10-CM

## 2019-05-04 LAB — COMPREHENSIVE METABOLIC PANEL
ALT: 11 U/L (ref 0–44)
AST: 17 U/L (ref 15–41)
Albumin: 3.1 g/dL — ABNORMAL LOW (ref 3.5–5.0)
Alkaline Phosphatase: 104 U/L (ref 38–126)
Anion gap: 18 — ABNORMAL HIGH (ref 5–15)
BUN: 11 mg/dL (ref 8–23)
CO2: 26 mmol/L (ref 22–32)
Calcium: 8.9 mg/dL (ref 8.9–10.3)
Chloride: 93 mmol/L — ABNORMAL LOW (ref 98–111)
Creatinine, Ser: 3.17 mg/dL — ABNORMAL HIGH (ref 0.44–1.00)
GFR calc Af Amer: 16 mL/min — ABNORMAL LOW (ref >60)
GFR calc non Af Amer: 14 mL/min — ABNORMAL LOW (ref >60)
Glucose, Bld: 113 mg/dL — ABNORMAL HIGH (ref 70–99)
Potassium: 3.5 mmol/L (ref 3.5–5.1)
Sodium: 137 mmol/L (ref 135–145)
Total Bilirubin: 1.3 mg/dL — ABNORMAL HIGH (ref 0.3–1.2)
Total Protein: 6.6 g/dL (ref 6.5–8.1)

## 2019-05-04 LAB — DIFFERENTIAL
Abs Immature Granulocytes: 0.24 10*3/uL — ABNORMAL HIGH (ref 0.00–0.07)
Basophils Absolute: 0.1 10*3/uL (ref 0.0–0.1)
Basophils Relative: 0 %
Eosinophils Absolute: 0.3 10*3/uL (ref 0.0–0.5)
Eosinophils Relative: 1 %
Immature Granulocytes: 1 %
Lymphocytes Relative: 14 %
Lymphs Abs: 2.8 10*3/uL (ref 0.7–4.0)
Monocytes Absolute: 1.5 10*3/uL — ABNORMAL HIGH (ref 0.1–1.0)
Monocytes Relative: 7 %
Neutro Abs: 15.9 10*3/uL — ABNORMAL HIGH (ref 1.7–7.7)
Neutrophils Relative %: 77 %

## 2019-05-04 LAB — I-STAT CHEM 8, ED
BUN: 12 mg/dL (ref 8–23)
Calcium, Ion: 0.98 mmol/L — ABNORMAL LOW (ref 1.15–1.40)
Chloride: 93 mmol/L — ABNORMAL LOW (ref 98–111)
Creatinine, Ser: 3.2 mg/dL — ABNORMAL HIGH (ref 0.44–1.00)
Glucose, Bld: 113 mg/dL — ABNORMAL HIGH (ref 70–99)
HCT: 15 % — ABNORMAL LOW (ref 36.0–46.0)
Hemoglobin: 5.1 g/dL — CL (ref 12.0–15.0)
Potassium: 3.3 mmol/L — ABNORMAL LOW (ref 3.5–5.1)
Sodium: 131 mmol/L — ABNORMAL LOW (ref 135–145)
TCO2: 29 mmol/L (ref 22–32)

## 2019-05-04 LAB — PREPARE RBC (CROSSMATCH)

## 2019-05-04 LAB — FOLATE: Folate: 8.8 ng/mL (ref >5.9)

## 2019-05-04 LAB — PROTIME-INR
INR: 1 (ref 0.8–1.2)
Prothrombin Time: 12.9 seconds (ref 11.4–15.2)

## 2019-05-04 LAB — CBC
HCT: 15.9 % — ABNORMAL LOW (ref 36.0–46.0)
Hemoglobin: 4.7 g/dL — CL (ref 12.0–15.0)
MCH: 29.9 pg (ref 26.0–34.0)
MCHC: 29.6 g/dL — ABNORMAL LOW (ref 30.0–36.0)
MCV: 101.3 fL — ABNORMAL HIGH (ref 80.0–100.0)
Platelets: 307 10*3/uL (ref 150–400)
RBC: 1.57 MIL/uL — ABNORMAL LOW (ref 3.87–5.11)
RDW: 17.6 % — ABNORMAL HIGH (ref 11.5–15.5)
WBC: 20.7 10*3/uL — ABNORMAL HIGH (ref 4.0–10.5)
nRBC: 0 % (ref 0.0–0.2)

## 2019-05-04 LAB — IRON AND TIBC
Iron: 46 ug/dL (ref 28–170)
Saturation Ratios: 17 % (ref 10.4–31.8)
TIBC: 269 ug/dL (ref 250–450)
UIBC: 223 ug/dL

## 2019-05-04 LAB — VITAMIN B12: Vitamin B-12: 520 pg/mL (ref 180–914)

## 2019-05-04 LAB — RETICULOCYTES
Immature Retic Fract: 33.3 % — ABNORMAL HIGH (ref 2.3–15.9)
RBC.: 1.58 MIL/uL — ABNORMAL LOW (ref 3.87–5.11)
Retic Count, Absolute: 42 10*3/uL (ref 19.0–186.0)
Retic Ct Pct: 2.7 % (ref 0.4–3.1)

## 2019-05-04 LAB — POC OCCULT BLOOD, ED: Fecal Occult Bld: NEGATIVE

## 2019-05-04 LAB — ETHANOL: Alcohol, Ethyl (B): <10 mg/dL (ref <10)

## 2019-05-04 LAB — APTT: aPTT: 90 seconds — ABNORMAL HIGH (ref 24–36)

## 2019-05-04 LAB — FERRITIN: Ferritin: 930 ng/mL — ABNORMAL HIGH (ref 11–307)

## 2019-05-04 MED ORDER — ZOLPIDEM TARTRATE 5 MG PO TABS: 5.0000 mg | ORAL_TABLET | Freq: Every evening | ORAL | Status: DC | PRN

## 2019-05-04 MED ORDER — CAMPHOR-MENTHOL 0.5-0.5 % EX LOTN
1.0000 "application " | TOPICAL_LOTION | Freq: Three times a day (TID) | CUTANEOUS | Status: DC | PRN
  Filled 2019-05-04: qty 222

## 2019-05-04 MED ORDER — HYDROXYZINE HCL 25 MG PO TABS: 25.0000 mg | ORAL_TABLET | Freq: Three times a day (TID) | ORAL | Status: DC | PRN

## 2019-05-04 MED ORDER — CALCIUM CARBONATE ANTACID 1250 MG/5ML PO SUSP
500.0000 mg | Freq: Four times a day (QID) | ORAL | Status: DC | PRN
  Filled 2019-05-04: qty 5

## 2019-05-04 MED ORDER — NEPRO/CARBSTEADY PO LIQD
237.0000 mL | Freq: Three times a day (TID) | ORAL | Status: DC | PRN
  Filled 2019-05-04: qty 237

## 2019-05-04 MED ORDER — ACETAMINOPHEN 325 MG PO TABS: 650.0000 mg | ORAL_TABLET | Freq: Four times a day (QID) | ORAL | Status: DC | PRN

## 2019-05-04 MED ORDER — ONDANSETRON HCL 4 MG/2ML IJ SOLN: 4.0000 mg | Freq: Four times a day (QID) | INTRAMUSCULAR | Status: DC | PRN

## 2019-05-04 MED ORDER — ACETAMINOPHEN 650 MG RE SUPP: 650.0000 mg | Freq: Four times a day (QID) | RECTAL | Status: DC | PRN

## 2019-05-04 MED ORDER — DOCUSATE SODIUM 283 MG RE ENEM
1.0000 | ENEMA | RECTAL | Status: DC | PRN
  Filled 2019-05-04: qty 1

## 2019-05-04 MED ORDER — ONDANSETRON HCL 4 MG PO TABS: 4.0000 mg | ORAL_TABLET | Freq: Four times a day (QID) | ORAL | Status: DC | PRN

## 2019-05-04 MED ORDER — SODIUM CHLORIDE 0.9 % IV SOLN: 10.0000 mL/h | Freq: Once | INTRAVENOUS | Status: DC

## 2019-05-04 MED ORDER — SORBITOL 70 % SOLN
30.0000 mL | Status: DC | PRN
  Filled 2019-05-04: qty 30

## 2019-05-04 NOTE — ED Notes (Signed)
Pt transported to MRI 

## 2019-05-04 NOTE — ED Provider Notes (Signed)
[facility] EMERGENCY DEPARTMENT Provider Note   CSN: [id number] Arrival date & time: 05/04/19  1543     History No chief complaint on file.   Joan Mosley is a 74 y.o. female hx of COPD, recent stroke, DM, ESRD on HD (last HD today), here presenting with altered mental status.  Patient recently had Covid and was put on 6 L nasal cannula.  Patient also recently had a stroke and just presented with altered mental status.  Patient was dialysis and apparently was taken off of oxygen.  She then was altered and confused.  Patient was put on oxygen and mental status back to baseline.  Patient saw palliative care during the hospitalization stay.  Patient just saw nursing home doctor several days ago and she remained full code.  The history is provided by the patient.       Past Medical History:  Diagnosis Date  . Arthritis   . COPD (chronic obstructive pulmonary disease) ([place])   . Diabetic neuropathy ([place])   . 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 Renal disorder    ESRD  . Thyroid disease   . TIA (transient ischemic attack)   . Type 2 diabetes mellitus with peripheral neuropathy [facility])     Patient Active Problem List   Diagnosis Date Noted  . Symptomatic anemia 05/04/2019  . Neurocognitive deficits 04/27/2019  . Adult failure to thrive   . Acute encephalopathy 04/20/2019  . Stroke ([place]) 04/20/2019  . Dysphagia 03/29/2019  . Aspiration into airway   . Pneumonia due to COVID-19 virus 02/09/2019  . Hypotension 12/24/2018  . Aspiration pneumonia of right lower lobe ([place])   . COPD (chronic obstructive pulmonary disease) ([place]) 11/11/2018  . TIA (transient ischemic attack)   . HLD (hyperlipidemia)   . Type II diabetes mellitus with renal manifestations ([place])   . Depression with anxiety   . Advanced care planning/counseling discussion   . Goals of care, counseling/discussion   . Palliative care by specialist   . Pulmonary edema 09/08/2018  . Respiratory  failure ([place]) 09/08/2018  . Weakness   . Chronic respiratory failure with hypoxia, on home O2 therapy ([place]) 09/06/2018  . Subdural hematoma ([place]) 08/22/2018  . Elevated troponin 08/22/2018  . ESRD (end stage renal disease) ([place]) 08/22/2018  . Anxiety 07/04/2018  . Counseling regarding advanced directives and goals of care 07/01/2018  . Full code status 07/01/2018  . Drug-induced constipation 07/01/2018  . Hemodialysis-associated hypotension 07/01/2018  . Dialysis patient ([place]) 07/01/2018  . HOH (hard of hearing) 07/01/2018  . Stage 5 chronic kidney disease on chronic dialysis ([place]) 07/01/2018  . Type 2 diabetes mellitus with diabetic polyneuropathy, with long-term current use of insulin ([place]) 07/01/2018  . Hypothyroidism 07/01/2018  . Gait instability 07/01/2018  . Hyperkalemia 04/20/2018  . Cerebral infarction, unspecified ([place]) 04/05/2018  . Chronic diastolic (congestive) heart failure ([place]) 04/05/2018  . Diarrhea, unspecified 04/05/2018  . Pain, unspecified 04/05/2018  . Pruritus, unspecified 04/05/2018  . Shortness of breath 04/05/2018  . Coagulation defect, unspecified ([place]) 03/31/2018  . Anemia in chronic kidney disease 03/25/2018  . Iron deficiency anemia, unspecified 03/25/2018  . Secondary hyperparathyroidism of renal origin ([place]) 03/25/2018    Past Surgical History:  Procedure Laterality Date  . ABDOMINAL HYSTERECTOMY    . IR FLUORO GUIDE CV LINE RIGHT  11/14/2018  . RECTOCELE REPAIR    . REPLACEMENT TOTAL KNEE BILATERAL Bilateral 2008  . THYROIDECTOMY     80%  .  VAGINAL WOUND CLOSURE / REPAIR       OB History   No obstetric history on file.     Family History  Problem Relation Age of Onset  . Heart disease Mother   . Hypertension Mother   . Heart disease Father   . Hypertension Father     Social History   Tobacco Use  . Smoking status: Former Smoker    Packs/day: 1.00    Years: 35.00    Pack years: 35.00    Quit date: 03/29/1981    Years since  quitting: 38.1  . Smokeless tobacco: Never Used  Substance Use Topics  . Alcohol use: Never  . Drug use: Never    Home Medications Prior to Admission medications   Medication Sig Start Date End Date Taking? Authorizing Provider  acetaminophen (TYLENOL) 500 MG tablet Take 1,000 mg by mouth every 6 (six) hours as needed (for arthritic pain).     [provider]  ascorbic acid (VITAMIN C) 500 MG tablet Take 1 tablet (500 mg total) by mouth daily. 02/23/19   Hosie Poisson, MD  aspirin EC 81 MG EC tablet Take 1 tablet (81 mg total) by mouth daily. 04/25/19   Hosie Poisson, MD  benzonatate (TESSALON) 200 MG capsule Take 1 capsule (200 mg total) by mouth 2 (two) times daily as needed for cough. 02/08/19   Nche, Charlene Brooke, NP  clonazePAM (KLONOPIN) 0.5 MG tablet TAKE 1/2 (ONE-HALF) TABLET BY MOUTH IN THE MORNING AND 1 IN THE EVENING 04/28/19   Ngetich, Dinah C, NP  Darbepoetin Alfa (ARANESP) 25 MCG/0.42ML SOSY injection Inject 0.42 mLs (25 mcg total) into the vein every Tuesday with hemodialysis. 11/22/18   Hosie Poisson, MD  docusate sodium (COLACE) 100 MG capsule Take 100 mg by mouth every morning.    [provider]  doxercalciferol (HECTOROL) 4 MCG/2ML injection Inject 1.5 mLs (3 mcg total) into the vein Every Tuesday,Thursday,and Saturday with dialysis. 04/25/19   Hosie Poisson, MD  FLUoxetine (PROZAC) 10 MG tablet Take 1 tablet (10 mg total) by mouth daily. 02/08/19   Nche, Charlene Brooke, NP  furosemide (LASIX) 80 MG tablet Take 80 mg by mouth.    [provider]  guaiFENesin (MUCINEX) 600 MG 12 hr tablet Take 1 tablet (600 mg total) by mouth 2 (two) times daily. 02/22/19   Hosie Poisson, MD  ipratropium-albuterol (DUONEB) 0.5-2.5 (3) MG/3ML SOLN Take 3 mLs by nebulization See admin instructions. Nebulize and inhale 3 ml's into the lungs every four hours, while awake    [provider]  levalbuterol (XOPENEX HFA) 45 MCG/ACT inhaler Inhale 1-2 puffs into the lungs  every 4 (four) hours as needed for wheezing.    [provider]  levothyroxine (SYNTHROID) 112 MCG tablet Take 1 tablet (112 mcg total) by mouth daily before breakfast. 1 AM 04/13/19   Nche, Charlene Brooke, NP  lidocaine (LIDODERM) 5 % Place 1 patch onto the skin daily as needed (pain). Remove & Discard patch within 12 hours or as directed by MD    [provider]  Lubricants (K-Y LUBRICANT JELLY SENSITIVE EX) Place 1 application into both nostrils as needed (as directed- for lubrication).     [provider]  midodrine (PROAMATINE) 10 MG tablet Take 10 mg by mouth See admin instructions. Take 10 mg by mouth on Tuesday, Thursday, Saturday 45 minutes before dialysis    [provider]  montelukast (SINGULAIR) 10 MG tablet Take 10 mg by mouth at bedtime.  [provider]  multivitamin (RENA-VIT) TABS tablet Take 1 tablet by mouth at bedtime. 11/21/18   Hosie Poisson, MD  Nutritional Supplements (FEEDING SUPPLEMENT, NEPRO CARB STEADY,) LIQD Take 237 mLs by mouth 2 (two) times daily between meals. 11/22/18   Hosie Poisson, MD  nystatin (MYCOSTATIN/NYSTOP) powder Apply 1 g topically 2 (two) times daily as needed (as directed- to any rashes).     [provider]  nystatin cream (MYCOSTATIN) Apply 1 application topically 2 (two) times daily as needed for dry skin.     [provider]  oxymetazoline (AFRIN) 0.05 % nasal spray Place 1 spray into both nostrils 2 (two) times daily as needed for congestion.    [provider]  senna (SENOKOT) 8.6 MG TABS tablet Take 1 tablet by mouth every morning.    [provider]  sevelamer carbonate (RENVELA) 800 MG tablet Take 1 tablet (800 mg total) by mouth as needed (with snacks). 02/22/19   Hosie Poisson, MD  SIMPLY SALINE NA Place 1-2 sprays into the nose as needed.    [provider]  VENTOLIN HFA 108 (90 Base) MCG/ACT inhaler Inhale 2 puffs into the lungs every 6 (six) hours as  needed for wheezing or shortness of breath. 02/14/19   Nche, Charlene Brooke, NP  heparin 5000 UNIT/ML injection Inject 1.5 mLs (7,500 Units total) into the skin every 8 (eight) hours. Patient not taking: Reported on 04/20/2019 02/22/19 04/20/19  Hosie Poisson, MD    Allergies    Avelox [moxifloxacin hcl in nacl], Codeine, Penicillins, Sulfa antibiotics, Dextromethorphan-guaifenesin, and Shellfish allergy  Review of Systems   Review of Systems  Psychiatric/Behavioral: Positive for confusion.  All other systems reviewed and are negative.   Physical Exam Updated Vital Signs BP 129/67 (BP Location: Left Arm)   Pulse 100   Temp 98.3 F (36.8 C) (Oral)   Resp 19   Ht 5\' 1"  (1.549 m)   Wt 83.3 kg   SpO2 96%   BMI 34.70 kg/m   Physical Exam Vitals and nursing note reviewed.  Constitutional:      Comments: A & O x 2   HENT:     Head: Normocephalic.     Mouth/Throat:     Mouth: Mucous membranes are moist.  Eyes:     Extraocular Movements: Extraocular movements intact.     Pupils: Pupils are equal, round, and reactive to light.  Cardiovascular:     Rate and Rhythm: Normal rate and regular rhythm.     Pulses: Normal pulses.     Heart sounds: Normal heart sounds.  Pulmonary:     Effort: Pulmonary effort is normal.     Breath sounds: Normal breath sounds.  Abdominal:     General: Abdomen is flat.     Palpations: Abdomen is soft.  Musculoskeletal:        General: Normal range of motion.     Cervical back: Normal range of motion.  Skin:    General: Skin is warm.     Capillary Refill: Capillary refill takes less than 2 seconds.  Neurological:     Comments: A & O x 2, no obvious facial droop, strength 4/5 bilateral legs, 5/5 bilateral arms, nl sensation throughout   Psychiatric:        Mood and Affect: Mood normal.     ED Results / Procedures / Treatments   Labs (all labs ordered are listed, but only abnormal results are displayed) Labs Reviewed  APTT - Abnormal; Notable for  the following components:      Result Value   aPTT 90 (*)    All other components within normal limits  CBC - Abnormal; Notable for the following components:   WBC 20.7 (*)    RBC 1.57 (*)    Hemoglobin 4.7 (*)    HCT 15.9 (*)    MCV 101.3 (*)    MCHC 29.6 (*)    RDW 17.6 (*)    All other components within normal limits  DIFFERENTIAL - Abnormal; Notable for the following components:   Neutro Abs 15.9 (*)    Monocytes Absolute 1.5 (*)    Abs Immature Granulocytes 0.24 (*)    All other components within normal limits  COMPREHENSIVE METABOLIC PANEL - Abnormal; Notable for the following components:   Chloride 93 (*)    Glucose, Bld 113 (*)    Creatinine, Ser 3.17 (*)    Albumin 3.1 (*)    Total Bilirubin 1.3 (*)    GFR calc non Af Amer 14 (*)    GFR calc Af Amer 16 (*)    Anion gap 18 (*)    All other components within normal limits  FERRITIN - Abnormal; Notable for the following components:   Ferritin 930 (*)    All other components within normal limits  RETICULOCYTES - Abnormal; Notable for the following components:   RBC. 1.58 (*)    Immature Retic Fract 33.3 (*)    All other components within normal limits  I-STAT CHEM 8, ED - Abnormal; Notable for the following components:   Sodium 131 (*)    Potassium 3.3 (*)    Chloride 93 (*)    Creatinine, Ser 3.20 (*)    Glucose, Bld 113 (*)    Calcium, Ion 0.98 (*)    Hemoglobin 5.1 (*)    HCT 15.0 (*)    All other components within normal limits  SARS CORONAVIRUS 2 (TAT 6-24 HRS)  ETHANOL  PROTIME-INR  VITAMIN B12  FOLATE  IRON AND TIBC  COMPREHENSIVE METABOLIC PANEL  PHOSPHORUS  CBC  POC OCCULT BLOOD, ED  TYPE AND SCREEN  PREPARE RBC (CROSSMATCH)    EKG EKG Interpretation  Date/Time:  Thursday May 04 2019 15:55:30 EDT Ventricular Rate:  97 PR Interval:    QRS Duration: 106 QT Interval:  371 QTC Calculation: 472 R Axis:   -21 Text Interpretation: Sinus rhythm Probable left ventricular hypertrophy Abnrm T,  consider ischemia, anterolateral lds No significant change since last tracing Confirmed by Wandra Arthurs [phone]) on 05/04/2019 4:11:20 PM   Radiology MR BRAIN WO CONTRAST  Result Date: 05/04/2019 CLINICAL DATA:  74 year old female with recent encephalopathy, ataxia. 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 EXAM: MRI HEAD WITHOUT CONTRAST TECHNIQUE: Multiplanar, multiecho pulse sequences of the brain and surrounding structures were obtained without intravenous contrast. COMPARISON:  Brain MRI 04/20/2019. FINDINGS: Brain: The tiny area of posterior right temporal lobe white matter diffusion abnormality has faded since last month. No new restricted diffusion to suggest acute infarction. No midline shift, mass effect, evidence of mass lesion, ventriculomegaly, extra-axial collection or acute intracranial hemorrhage. Cervicomedullary junction and pituitary are within normal limits. Stable gray and white matter signal throughout the brain. Widespread bilateral cerebral white matter and deep gray nuclei T2 and FLAIR hyperintensity. Moderate similar T2 and FLAIR heterogeneity in the pons. No cortical encephalomalacia or chronic cerebral blood products identified. Cerebellum remains within normal limits. Vascular: Major intracranial vascular flow voids are stable. Skull and upper  cervical spine: Negative for age visible cervical spine. Bone marrow signal is stable and within normal limits. Sinuses/Orbits: Stable, negative. Other: Persistent bilateral mastoid effusions. Negative nasopharynx. Other visible internal auditory structures appear grossly normal. Negative scalp and face soft tissues. IMPRESSION: 1. No acute intracranial abnormality. 2. 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 3. Otherwise stable brain with advanced signal changes most compatible with chronic small vessel disease. 4. Persistent mastoid effusions, most commonly postinflammatory.  Electronically Signed   By: Genevie Ann M.D.   On: 05/04/2019 17:42   DG Chest Port 1 View  Result Date: 05/04/2019 CLINICAL DATA:  Altered mental status EXAM: PORTABLE CHEST 1 VIEW COMPARISON:  04/24/2019 FINDINGS: Right-sided central line tip in the proximal right atrium as seen previously. Patient rotated towards the right. Aortic atherosclerosis. Left lung shows resolution of atelectasis previously seen at the base. Widespread tiny reticular nodules remain visible, raising possibility of atypical pneumonia. Chronic volume loss persists at the right base. Fine reticular nodular opacities also visible on this side. IMPRESSION: Chronic volume loss at the right base. Improved aeration at the left base. Tiny reticulo nodular shadows throughout both lungs raising the possibility of atypical pneumonia. Electronically Signed   By: Nelson Chimes M.D.   On: 05/04/2019 16:51    Procedures Procedures (including critical care time)  Angiocath insertion Performed by: Wandra Arthurs  Consent: Verbal consent obtained. Risks and benefits: risks, benefits and alternatives were discussed Time out: Immediately prior to procedure a "time out" was called to verify the correct patient, procedure, equipment, support staff and site/side marked as required.  Preparation: Patient was prepped and draped in the usual sterile fashion.  Vein Location: R antecube  Ultrasound Guided  Gauge: 20 long   Normal blood return and flush without difficulty Patient tolerance: Patient tolerated the procedure well with no immediate complications.  CRITICAL CARE Performed by: Wandra Arthurs   Total critical care time: 30 minutes  Critical care time was exclusive of separately billable procedures and treating other patients.  Critical care was necessary to treat or prevent imminent or life-threatening deterioration.  Critical care was time spent personally by me on the following activities: development of treatment plan with  patient and/or surrogate as well as nursing, discussions with consultants, evaluation of patient's response to treatment, examination of patient, obtaining history from patient or surrogate, ordering and performing treatments and interventions, ordering and review of laboratory studies, ordering and review of radiographic studies, pulse oximetry and re-evaluation of patient's condition.     Medications Ordered in ED Medications  0.9 %  sodium chloride infusion (0 mL/hr Intravenous Hold 05/04/19 1810)  acetaminophen (TYLENOL) tablet 650 mg (has no administration in time range)    Or  acetaminophen (TYLENOL) suppository 650 mg (has no administration in time range)  zolpidem (AMBIEN) tablet 5 mg (has no administration in time range)  camphor-menthol (SARNA) lotion 1 application (has no administration in time range)    And  hydrOXYzine (ATARAX/VISTARIL) tablet 25 mg (has no administration in time range)  sorbitol 70 % solution 30 mL (has no administration in time range)  docusate sodium (ENEMEEZ) enema 283 mg (has no administration in time range)  ondansetron (ZOFRAN) tablet 4 mg (has no administration in time range)    Or  ondansetron (ZOFRAN) injection 4 mg (has no administration in time range)  calcium carbonate (dosed in mg elemental calcium) suspension 500 mg of elemental calcium (has no administration in time range)  feeding  supplement (NEPRO CARB STEADY) liquid 237 mL (has no administration in time range)    ED Course  I have reviewed the triage vital signs and the nursing notes.  Pertinent labs & imaging results that were available during my care of the patient were reviewed by me and considered in my medical decision making (see chart for details).    MDM Rules/Calculators/A&P                      Joan Mosley is a 74 y.o. female who presented with altered mental status and confusion.  Differential is broad.  Patient is dialysis patient so consider electrolyte abnormalities.   Patient also had a recent stroke with similar presentation.  Patient has no focal neurologic deficit currently.  She is not a TPA candidate.  Patient also was taken off of her oxygen so likely had some hypoxia causing her altered mental status.  Will get labs and CT head and CXR and reassess.  6 pm MRI is negative and CBC shows hemoglobin of 4.7.  Her guaiac is negative.  Her potassium is 3.5 and creatinine is baseline.  I think her altered mental status likely secondary to anemia.  Is not on any blood thinners.  She is guaiac negative.  Will send off anemia panel.  Her anemia likely secondary to CKD.  I had extensive discussion with patient and her son, who is a paramedic.  Per her son, patient is full code and she rescinded her DNR during the previous admission.  Patient states that she wants everything done and agreed to a blood transfusion.  I consulted Dr. Louie Boston from nephrology to dialyze patient tomorrow. Medicine team to admit for symptomatic anemia.   Final Clinical Impression(s) / ED Diagnoses Final diagnoses:  None    Rx / DC Orders ED Discharge Orders    None       Drenda Freeze, MD 05/04/19 2317

## 2019-05-04 NOTE — H&P (Signed)
History and Physical   Joan Mosley WCH:[id number] DOB: 1945-06-23 DOA: 05/04/2019  Referring MD/NP/PA: Dr Darl Householder  PCP: Flossie Buffy, NP   Outpatient Specialists: [facility]   Patient coming from: Home  Chief Complaint: Generalized weakness  HPI: Joan Mosley is a 74 y.o. female with medical history significant of end-stage renal disease on hemodialysis, diabetes, chronic respiratory failure on oxygen, hypothyroidism, recent COVID-19 infection about 2 months ago, diabetic neuropathy who was at dialysis today and got weak.  Was never dialyzed for the reason.  Was delirious.  Patient brought into the ER where she was found to have a hemoglobin of 4.7.  No active bleeding.  No reported bleeding episodes.  Patient apparently has had chronic respiratory failure with her dialysis and was DNR until 2 months ago when she had COVID-19 infection and she rescinded her DNR status.  She is now symptomatic with her anemia.  Stool guaiac is negative and the exact source is not known.  Patient has obstructive sleep apnea uses a CPAP at home.  She also has been on pured diet. .  ED Course: Temperature 98.3 blood pressure 129/67 pulse 109 respirate 34 oxygen sats 96% on room air.  Sodium 131 potassium 3.3 chloride 93 his CO2 stable glucose 113 BUN 12 creatinine 3.20 and calcium 0.90.  White count is 20.7 platelets 307.  Her reticulocyte percent is 2.7.  Patient being diagnosed with symptomatic anemia.  Stool guaiacs negative.  Review of Systems: As per HPI otherwise 10 point review of systems negative.    Past Medical History:  Diagnosis Date  . Arthritis   . COPD (chronic obstructive pulmonary disease) ([place])   . Diabetic neuropathy ([place])   . 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 Renal disorder    ESRD  . Thyroid disease   . TIA (transient ischemic attack)   . Type 2 diabetes mellitus with peripheral neuropathy [facility])     Past Surgical History:  Procedure Laterality Date   . ABDOMINAL HYSTERECTOMY    . IR FLUORO GUIDE CV LINE RIGHT  11/14/2018  . RECTOCELE REPAIR    . REPLACEMENT TOTAL KNEE BILATERAL Bilateral 2008  . THYROIDECTOMY     80%  . VAGINAL WOUND CLOSURE / REPAIR       reports that she quit smoking about 38 years ago. She has a 35.00 pack-year smoking history. She has never used smokeless tobacco. She reports that she does not drink alcohol or use drugs.  Allergies  Allergen Reactions  . Avelox [Moxifloxacin Hcl In Nacl] Other (See Comments)    Unknown reaction  . Codeine Anaphylaxis  . Penicillins Rash    Did it involve swelling of the face/tongue/throat, SOB, or low BP? Unk Did it involve sudden or severe rash/hives, skin peeling, or any reaction on the inside of your mouth or nose? Yes Did you need to seek medical attention at a hospital or doctor's office? Unk When did it last happen? Unk If all above answers are "NO", may proceed with cephalosporin use.   . Sulfa Antibiotics Other (See Comments)    Unknown reaction  . Dextromethorphan-Guaifenesin Other (See Comments)    Reported by [facility] - unknown reaction. Pt states she is not allergic to dextromethorphan-guaifenesin.  . Shellfish Allergy Hives, Itching and Rash    Family History  Problem Relation Age of Onset  . Heart disease Mother   . Hypertension Mother   . Heart disease Father   . Hypertension Father  Prior to Admission medications   Medication Sig Start Date End Date Taking? Authorizing Provider  acetaminophen (TYLENOL) 500 MG tablet Take 1,000 mg by mouth every 6 (six) hours as needed (for arthritic pain).     [provider]  ascorbic acid (VITAMIN C) 500 MG tablet Take 1 tablet (500 mg total) by mouth daily. 02/23/19   Hosie Poisson, MD  aspirin EC 81 MG EC tablet Take 1 tablet (81 mg total) by mouth daily. 04/25/19   Hosie Poisson, MD  benzonatate (TESSALON) 200 MG capsule Take 1 capsule (200 mg total) by mouth 2 (two) times daily as needed for  cough. 02/08/19   Nche, Charlene Brooke, NP  clonazePAM (KLONOPIN) 0.5 MG tablet TAKE 1/2 (ONE-HALF) TABLET BY MOUTH IN THE MORNING AND 1 IN THE EVENING 04/28/19   Ngetich, Dinah C, NP  Darbepoetin Alfa (ARANESP) 25 MCG/0.42ML SOSY injection Inject 0.42 mLs (25 mcg total) into the vein every Tuesday with hemodialysis. 11/22/18   Hosie Poisson, MD  docusate sodium (COLACE) 100 MG capsule Take 100 mg by mouth every morning.    [provider]  doxercalciferol (HECTOROL) 4 MCG/2ML injection Inject 1.5 mLs (3 mcg total) into the vein Every Tuesday,Thursday,and Saturday with dialysis. 04/25/19   Hosie Poisson, MD  FLUoxetine (PROZAC) 10 MG tablet Take 1 tablet (10 mg total) by mouth daily. 02/08/19   Nche, Charlene Brooke, NP  furosemide (LASIX) 80 MG tablet Take 80 mg by mouth.    [provider]  guaiFENesin (MUCINEX) 600 MG 12 hr tablet Take 1 tablet (600 mg total) by mouth 2 (two) times daily. 02/22/19   Hosie Poisson, MD  ipratropium-albuterol (DUONEB) 0.5-2.5 (3) MG/3ML SOLN Take 3 mLs by nebulization See admin instructions. Nebulize and inhale 3 ml's into the lungs every four hours, while awake    [provider]  levalbuterol (XOPENEX HFA) 45 MCG/ACT inhaler Inhale 1-2 puffs into the lungs every 4 (four) hours as needed for wheezing.    [provider]  levothyroxine (SYNTHROID) 112 MCG tablet Take 1 tablet (112 mcg total) by mouth daily before breakfast. 1 AM 04/13/19   Nche, Charlene Brooke, NP  lidocaine (LIDODERM) 5 % Place 1 patch onto the skin daily as needed (pain). Remove & Discard patch within 12 hours or as directed by MD    [provider]  Lubricants (K-Y LUBRICANT JELLY SENSITIVE EX) Place 1 application into both nostrils as needed (as directed- for lubrication).     [provider]  midodrine (PROAMATINE) 10 MG tablet Take 10 mg by mouth See admin instructions. Take 10 mg by mouth on Tuesday, Thursday, Saturday 45 minutes before dialysis     [provider]  montelukast (SINGULAIR) 10 MG tablet Take 10 mg by mouth at bedtime.     [provider]  multivitamin (RENA-VIT) TABS tablet Take 1 tablet by mouth at bedtime. 11/21/18   Hosie Poisson, MD  Nutritional Supplements (FEEDING SUPPLEMENT, NEPRO CARB STEADY,) LIQD Take 237 mLs by mouth 2 (two) times daily between meals. 11/22/18   Hosie Poisson, MD  nystatin (MYCOSTATIN/NYSTOP) powder Apply 1 g topically 2 (two) times daily as needed (as directed- to any rashes).     [provider]  nystatin cream (MYCOSTATIN) Apply 1 application topically 2 (two) times daily as needed for dry skin.     [provider]  oxymetazoline (AFRIN) 0.05 % nasal spray Place 1 spray into both nostrils 2 (two) times daily as needed for congestion.  [provider]  senna (SENOKOT) 8.6 MG TABS tablet Take 1 tablet by mouth every morning.    [provider]  sevelamer carbonate (RENVELA) 800 MG tablet Take 1 tablet (800 mg total) by mouth as needed (with snacks). 02/22/19   Hosie Poisson, MD  SIMPLY SALINE NA Place 1-2 sprays into the nose as needed.    [provider]  VENTOLIN HFA 108 (90 Base) MCG/ACT inhaler Inhale 2 puffs into the lungs every 6 (six) hours as needed for wheezing or shortness of breath. 02/14/19   Nche, Charlene Brooke, NP  heparin 5000 UNIT/ML injection Inject 1.5 mLs (7,500 Units total) into the skin every 8 (eight) hours. Patient not taking: Reported on 04/20/2019 02/22/19 04/20/19  Hosie Poisson, MD    Physical Exam: Vitals:   05/04/19 2153 05/04/19 2200 05/04/19 2217 05/04/19 2306  BP: 114/61 102/75 123/76 129/67  Pulse: (!) 101 (!) 102 (!) 104 100  Resp: (!) 24 (!) 23 (!) 22 19  Temp: 98.1 F (36.7 C)  98.3 F (36.8 C) 98.3 F (36.8 C)  TempSrc: Oral  Oral Oral  SpO2: 99% 98% 97% 96%  Weight:    83.3 kg  Height:    5\' 1"  (1.549 m)      Constitutional: Chronic ill-looking, obesity Vitals:   05/04/19 2153 05/04/19  2200 05/04/19 2217 05/04/19 2306  BP: 114/61 102/75 123/76 129/67  Pulse: (!) 101 (!) 102 (!) 104 100  Resp: (!) 24 (!) 23 (!) 22 19  Temp: 98.1 F (36.7 C)  98.3 F (36.8 C) 98.3 F (36.8 C)  TempSrc: Oral  Oral Oral  SpO2: 99% 98% 97% 96%  Weight:    83.3 kg  Height:    5\' 1"  (1.549 m)   Eyes: PERRL, lids and conjunctivae normal ENMT: Mucous membranes are moist. Posterior pharynx clear of any exudate or lesions.Normal dentition.  Neck: normal, supple, no masses, no thyromegaly Respiratory: Decreased air entry bilaterally with some crackles, no wheeze normal respiratory effort. No accessory muscle use.  Cardiovascular: Sinus tachycardia, no murmurs / rubs / gallops.  2+ extremity edema. 2+ pedal pulses. No carotid bruits.  Abdomen: no tenderness, no masses palpated. No hepatosplenomegaly. Bowel sounds positive.  Musculoskeletal: no clubbing / cyanosis. No joint deformity upper and lower extremities. Good ROM, no contractures. Normal muscle tone.  Skin: no rashes, lesions, ulcers. No induration Neurologic: CN 2-12 grossly intact. Sensation intact, DTR normal. Strength 5/5 in all 4.  Psychiatric: Hard of hearing and drowsy.     Labs on Admission: I have personally reviewed following labs and imaging studies  CBC: Recent Labs  Lab 05/04/19 1621 05/04/19 1630  WBC 20.7*  --   NEUTROABS 15.9*  --   HGB 4.7* 5.1*  HCT 15.9* 15.0*  MCV 101.3*  --   PLT 307  --    Basic Metabolic Panel: Recent Labs  Lab 05/04/19 1621 05/04/19 1630  NA 137 131*  K 3.5 3.3*  CL 93* 93*  CO2 26  --   GLUCOSE 113* 113*  BUN 11 12  CREATININE 3.17* 3.20*  CALCIUM 8.9  --    GFR: Estimated Creatinine Clearance: 14.9 mL/min (A) (by C-G formula based on SCr of 3.2 mg/dL (H)). Liver Function Tests: Recent Labs  Lab 05/04/19 1621  AST 17  ALT 11  ALKPHOS 104  BILITOT 1.3*  PROT 6.6  ALBUMIN 3.1*   No results for input(s): LIPASE, AMYLASE in the last 168 hours. No results for  input(s): AMMONIA  in the last 168 hours. Coagulation Profile: Recent Labs  Lab 05/04/19 1621  INR 1.0   Cardiac Enzymes: No results for input(s): CKTOTAL, CKMB, CKMBINDEX, TROPONINI in the last 168 hours. BNP (last 3 results) Recent Labs    04/10/19 1418  PROBNP 81.0   HbA1C: No results for input(s): HGBA1C in the last 72 hours. CBG: No results for input(s): GLUCAP in the last 168 hours. Lipid Profile: No results for input(s): CHOL, HDL, LDLCALC, TRIG, CHOLHDL, LDLDIRECT in the last 72 hours. Thyroid Function Tests: No results for input(s): TSH, T4TOTAL, FREET4, T3FREE, THYROIDAB in the last 72 hours. Anemia Panel: Recent Labs    05/04/19 1621  VITAMINB12 520  FOLATE 8.8  FERRITIN 930*  TIBC 269  IRON 46  RETICCTPCT 2.7   Urine analysis:    Component Value Date/Time   COLORURINE YELLOW 04/20/2018 0301   APPEARANCEUR HAZY (A) 04/20/2018 0301   LABSPEC 1.009 04/20/2018 0301   PHURINE 9.0 (H) 04/20/2018 0301   GLUCOSEU 150 (A) 04/20/2018 0301   HGBUR MODERATE (A) 04/20/2018 0301   BILIRUBINUR NEGATIVE 04/20/2018 0301   KETONESUR NEGATIVE 04/20/2018 0301   PROTEINUR 100 (A) 04/20/2018 0301   NITRITE NEGATIVE 04/20/2018 0301   LEUKOCYTESUR NEGATIVE 04/20/2018 0301   Sepsis Labs: @LABRCNTIP (procalcitonin:4,lacticidven:4) )No results found for this or any previous visit (from the past 240 hour(s)).   Radiological Exams on Admission: MR BRAIN WO CONTRAST  Result Date: 05/04/2019 CLINICAL DATA:  74 year old female with recent encephalopathy, ataxia. 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 EXAM: MRI HEAD WITHOUT CONTRAST TECHNIQUE: Multiplanar, multiecho pulse sequences of the brain and surrounding structures were obtained without intravenous contrast. COMPARISON:  Brain MRI 04/20/2019. FINDINGS: Brain: The tiny area of posterior right temporal lobe white matter diffusion abnormality has faded since last month. No new restricted diffusion to  suggest acute infarction. No midline shift, mass effect, evidence of mass lesion, ventriculomegaly, extra-axial collection or acute intracranial hemorrhage. Cervicomedullary junction and pituitary are within normal limits. Stable gray and white matter signal throughout the brain. Widespread bilateral cerebral white matter and deep gray nuclei T2 and FLAIR hyperintensity. Moderate similar T2 and FLAIR heterogeneity in the pons. No cortical encephalomalacia or chronic cerebral blood products identified. Cerebellum remains within normal limits. Vascular: Major intracranial vascular flow voids are stable. Skull and upper cervical spine: Negative for age visible cervical spine. Bone marrow signal is stable and within normal limits. Sinuses/Orbits: Stable, negative. Other: Persistent bilateral mastoid effusions. Negative nasopharynx. Other visible internal auditory structures appear grossly normal. Negative scalp and face soft tissues. IMPRESSION: 1. No acute intracranial abnormality. 2. 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 3. Otherwise stable brain with advanced signal changes most compatible with chronic small vessel disease. 4. Persistent mastoid effusions, most commonly postinflammatory. Electronically Signed   By: Genevie Ann M.D.   On: 05/04/2019 17:42   DG Chest Port 1 View  Result Date: 05/04/2019 CLINICAL DATA:  Altered mental status EXAM: PORTABLE CHEST 1 VIEW COMPARISON:  04/24/2019 FINDINGS: Right-sided central line tip in the proximal right atrium as seen previously. Patient rotated towards the right. Aortic atherosclerosis. Left lung shows resolution of atelectasis previously seen at the base. Widespread tiny reticular nodules remain visible, raising possibility of atypical pneumonia. Chronic volume loss persists at the right base. Fine reticular nodular opacities also visible on this side. IMPRESSION: Chronic volume loss at the right base. Improved aeration at the  left base. Tiny reticulo nodular shadows throughout  both lungs raising the possibility of atypical pneumonia. Electronically Signed   By: Nelson Chimes M.D.   On: 05/04/2019 16:51     Assessment/Plan Principal Problem:   Symptomatic anemia Active Problems:   Dialysis patient ([place])   HOH (hard of hearing)   Type 2 diabetes mellitus with diabetic polyneuropathy, with long-term current use of insulin ([facility])   Hypothyroidism   ESRD (end stage renal disease) ([facility])   Weakness   COPD (chronic obstructive pulmonary disease) ([facility])   Chronic diastolic (congestive) heart failure ([place])     #1 symptomatic anemia: Patient's H&H has dropped.  No obvious source.  Could be slow bleed that is not quite obvious.  Patient may also have had nutritional or due to her chronic kidney disease.  Work-up for anemia will be ongoing.  Patient to be transfused 3 units of packed red blood cells.  We will continue to monitor H&H.  #2 end-stage renal disease: Nephrology consulted.  Patient to continue with hemodialysis per nephrology.  #3 diabetes: Blood sugars fully controlled.  Continue management.  #4 hypothyroidism: Continue to replete with levothyroxine.  #5 hard of hearing: At baseline.  #6 COPD: No exacerbation.  Continue home oxygen.  #7 chronic diastolic heart failure: Stable at baseline.    DVT prophylaxis: SCD Code Status: Full code Family Communication: Son at baseline Disposition Plan: To be determined Consults called: Nephrology Dr. Moshe Cipro Admission status: Inpatient  Severity of Illness: The appropriate patient status for this patient is INPATIENT. Inpatient status is judged to be reasonable and necessary in order to provide the required intensity of service to ensure the patient's safety. The patient's presenting symptoms, physical exam findings, and initial radiographic and laboratory data in the context of their chronic comorbidities is felt to place them at high risk for further  clinical deterioration. Furthermore, it is not anticipated that the patient will be medically stable for discharge from the hospital within 2 midnights of admission. The following factors support the patient status of inpatient.   " The patient's presenting symptoms include weakness. " The worrisome physical exam findings include lower extremity edema. " The initial radiographic and laboratory data are worrisome because of hemoglobin 4.1. " The chronic co-morbidities include end-stage renal disease.   * I certify that at the point of admission it is my clinical judgment that the patient will require inpatient hospital care spanning beyond 2 midnights from the point of admission due to high intensity of service, high risk for further deterioration and high frequency of surveillance required.Barbette Merino MD [facility] Pager [phone]  If 7PM-7AM, please contact night-coverage [email or web address] Password [facility]  05/05/2019, 12:19 AM

## 2019-05-04 NOTE — ED Notes (Signed)
Pt's son informed RN that his Mother is on a dysaphia 3 diet and uses thicken at home.  Pt also uses a CPAP at night.  Provider called to be informed and orders can be changed.

## 2019-05-04 NOTE — Progress Notes (Signed)
Patient arrived from ER.  Vitals signs taken. Put on 6 L O2 nasal cannula.  Patient is alert to self, time and place but not situation.  Receiving her second Unit of RBCs. Will continue to monitor.  Lupita Dawn, RN

## 2019-05-04 NOTE — ED Triage Notes (Signed)
Pt arrives via EMS- pt went to dialysis feeling normal. Pt completed half treatment, pt very lethargic and not talking much. EMS states she is alert/oriented x3 but confused on the day of the week. Pt was initially hypoxic per the clinic to EMS- but pt was not on her 6L [place] for several hours that she is usually on. EMS reports once they placed it back on her sats were 100%. BP 135/63, HR 92,  CBG 136.

## 2019-05-04 NOTE — ED Notes (Signed)
Requested the unit Secretary page provider asking for order for COVID testing.

## 2019-05-04 NOTE — ED Notes (Signed)
[place]

## 2019-05-05 DIAGNOSIS — I5032 Chronic diastolic (congestive) heart failure: Secondary | ICD-10-CM | POA: Diagnosis not present

## 2019-05-05 DIAGNOSIS — Z7189 Other specified counseling: Secondary | ICD-10-CM | POA: Diagnosis not present

## 2019-05-05 DIAGNOSIS — Z515 Encounter for palliative care: Secondary | ICD-10-CM

## 2019-05-05 DIAGNOSIS — J449 Chronic obstructive pulmonary disease, unspecified: Secondary | ICD-10-CM

## 2019-05-05 DIAGNOSIS — D649 Anemia, unspecified: Secondary | ICD-10-CM

## 2019-05-05 DIAGNOSIS — E039 Hypothyroidism, unspecified: Secondary | ICD-10-CM

## 2019-05-05 DIAGNOSIS — N186 End stage renal disease: Secondary | ICD-10-CM

## 2019-05-05 DIAGNOSIS — Z992 Dependence on renal dialysis: Secondary | ICD-10-CM

## 2019-05-05 LAB — CBC WITH DIFFERENTIAL/PLATELET
Abs Immature Granulocytes: 0.06 10*3/uL (ref 0.00–0.07)
Basophils Absolute: 0.1 10*3/uL (ref 0.0–0.1)
Basophils Relative: 1 %
Eosinophils Absolute: 0.2 10*3/uL (ref 0.0–0.5)
Eosinophils Relative: 2 %
HCT: 40.9 % (ref 36.0–46.0)
Hemoglobin: 12.6 g/dL (ref 12.0–15.0)
Immature Granulocytes: 1 %
Lymphocytes Relative: 22 %
Lymphs Abs: 1.7 10*3/uL (ref 0.7–4.0)
MCH: 28.4 pg (ref 26.0–34.0)
MCHC: 30.8 g/dL (ref 30.0–36.0)
MCV: 92.3 fL (ref 80.0–100.0)
Monocytes Absolute: 0.7 10*3/uL (ref 0.1–1.0)
Monocytes Relative: 9 %
Neutro Abs: 5.2 10*3/uL (ref 1.7–7.7)
Neutrophils Relative %: 65 %
Platelets: 153 10*3/uL (ref 150–400)
RBC: 4.43 MIL/uL (ref 3.87–5.11)
RDW: 18.9 % — ABNORMAL HIGH (ref 11.5–15.5)
WBC: 8 10*3/uL (ref 4.0–10.5)
nRBC: 0 % (ref 0.0–0.2)

## 2019-05-05 LAB — GLUCOSE, CAPILLARY
Glucose-Capillary: 116 mg/dL — ABNORMAL HIGH (ref 70–99)
Glucose-Capillary: 107 mg/dL — ABNORMAL HIGH (ref 70–99)
Glucose-Capillary: 80 mg/dL (ref 70–99)

## 2019-05-05 LAB — COMPREHENSIVE METABOLIC PANEL
ALT: 13 U/L (ref 0–44)
AST: 14 U/L — ABNORMAL LOW (ref 15–41)
Albumin: 2.7 g/dL — ABNORMAL LOW (ref 3.5–5.0)
Alkaline Phosphatase: 77 U/L (ref 38–126)
Anion gap: 18 — ABNORMAL HIGH (ref 5–15)
BUN: 15 mg/dL (ref 8–23)
CO2: 25 mmol/L (ref 22–32)
Calcium: 9.6 mg/dL (ref 8.9–10.3)
Chloride: 93 mmol/L — ABNORMAL LOW (ref 98–111)
Creatinine, Ser: 3.87 mg/dL — ABNORMAL HIGH (ref 0.44–1.00)
GFR calc Af Amer: 12 mL/min — ABNORMAL LOW (ref >60)
GFR calc non Af Amer: 11 mL/min — ABNORMAL LOW (ref >60)
Glucose, Bld: 82 mg/dL (ref 70–99)
Potassium: 3.6 mmol/L (ref 3.5–5.1)
Sodium: 136 mmol/L (ref 135–145)
Total Bilirubin: 1.1 mg/dL (ref 0.3–1.2)
Total Protein: 5.6 g/dL — ABNORMAL LOW (ref 6.5–8.1)

## 2019-05-05 LAB — SARS CORONAVIRUS 2 (TAT 6-24 HRS): SARS Coronavirus 2: NEGATIVE

## 2019-05-05 LAB — PHOSPHORUS: Phosphorus: 7.3 mg/dL — ABNORMAL HIGH (ref 2.5–4.6)

## 2019-05-05 MED ORDER — LEVOTHYROXINE SODIUM 112 MCG PO TABS
112.0000 ug | ORAL_TABLET | Freq: Every day | ORAL | Status: DC
  Administered 2019-05-06 – 2019-05-08 (×3): 112 ug via ORAL
  Filled 2019-05-05 (×3): qty 1

## 2019-05-05 MED ORDER — ALBUTEROL SULFATE (2.5 MG/3ML) 0.083% IN NEBU: 2.5000 mg | INHALATION_SOLUTION | Freq: Four times a day (QID) | RESPIRATORY_TRACT | Status: DC | PRN

## 2019-05-05 MED ORDER — INSULIN ASPART 100 UNIT/ML ~~LOC~~ SOLN: 0.0000 [IU] | Freq: Three times a day (TID) | SUBCUTANEOUS | Status: DC

## 2019-05-05 MED ORDER — INSULIN ASPART 100 UNIT/ML ~~LOC~~ SOLN: 0.0000 [IU] | Freq: Every day | SUBCUTANEOUS | Status: DC

## 2019-05-05 MED ORDER — BIOTENE DRY MOUTH MT LIQD
15.0000 mL | Freq: Two times a day (BID) | OROMUCOSAL | Status: DC
  Administered 2019-05-05: 15 mL via OROMUCOSAL

## 2019-05-05 MED ORDER — SODIUM CHLORIDE 0.9 % IV SOLN
62.5000 mg | INTRAVENOUS | Status: DC
  Administered 2019-05-06: 62.5 mg via INTRAVENOUS
  Filled 2019-05-05: qty 5

## 2019-05-05 MED ORDER — CHLORHEXIDINE GLUCONATE CLOTH 2 % EX PADS
6.0000 | MEDICATED_PAD | Freq: Every day | CUTANEOUS | Status: DC
  Administered 2019-05-05 – 2019-05-08 (×3): 6 via TOPICAL

## 2019-05-05 MED ORDER — OXYMETAZOLINE HCL 0.05 % NA SOLN
1.0000 | Freq: Two times a day (BID) | NASAL | Status: DC | PRN
  Filled 2019-05-05: qty 30

## 2019-05-05 MED ORDER — MONTELUKAST SODIUM 10 MG PO TABS
10.0000 mg | ORAL_TABLET | Freq: Every day | ORAL | Status: DC
  Administered 2019-05-05 – 2019-05-07 (×3): 10 mg via ORAL
  Filled 2019-05-05 (×3): qty 1

## 2019-05-05 NOTE — Progress Notes (Signed)
Patient refused CPAP/BIPAP tonight. Informed patient to call if she changed her mind.

## 2019-05-05 NOTE — Progress Notes (Addendum)
Patient ID: Joan Mosley, female   DOB: Aug 19, 1945, 74 y.o.   MRN: [id number]  PROGRESS NOTE    Joan Mosley  LNL:[id number] DOB: March 23, 1945 DOA: 05/04/2019 PCP: Flossie Buffy, NP   Brief Narrative:  74 year old female with history of end-stage renal disease on hemodialysis, diabetes mellitus type 2, chronic hypoxic respiratory failure on oxygen, hypothyroidism, recent COVID-19 infection about 2 months ago, diabetic neuropathy presented with generalized weakness on 05/04/2019 from dialysis unit; dialysis could not be done because of weakness and delirium.  In the ED, she was found to have hemoglobin of 4.7 with no active bleeding or any reported bleeding episodes; stool guaiac was negative.  Nephrology was consulted.  Assessment & Plan:  Acute on chronic anemia of chronic disease -Presented with hemoglobin of 4.7.  Prior hemoglobin was 10.1 on 04/22/2019.  No overt signs of bleeding.  Stool for guaiac was negative. -Status post 3 units packed red cells transfusion on admission.  Will get H&H today.  I will consult GI as well  End-stage renal disease on hemodialysis -Nephrology consulted.  Dialysis as per nephrology schedule  Diabetes mellitus type 2 -Monitor CBGs with SSI  Leukocytosis -Questionable cause.  Monitor.  Chronic diastolic heart failure -Currently compensated.  Volume managed by dialysis.  Hypothyroidism -Continue Synthroid  Chronic hypoxic respiratory failure COPD -Currently at baseline.  Continue oxygen supplementation.  Generalized deconditioning -Overall prognosis is guarded to poor.  Patient used to be a DNR in the past but CODE STATUS was changed to full code few months ago -Palliative care consult for goals of care discussion  -PT eval   DVT prophylaxis: SCDs.  Avoid heparin because of anemia Code Status: Full Family Communication: Spoke to son/Ryan on phone on 05/05/2019 Disposition Plan: We will remain inpatient as patient is still anemic and not  ready for discharge yet.  Will need PT evaluation and further input from GI/nephrology  Consultants: GI/nephrology/palliative care  Procedures: None  Antimicrobials: None   Subjective: Patient seen and examined at bedside.  She is very drowsy, wakes up slightly and answers very few questions and slightly confused.  No overt black or bloody stools reported by nursing staff.  No overnight fever or vomiting reported.  Objective: Vitals:   05/05/19 0137 05/05/19 0300 05/05/19 0352 05/05/19 0718  BP: 121/73 104/64 (!) 118/55 115/64  Pulse: 99 96 88 86  Resp: (!) 22 18 19 20   Temp: 98.2 F (36.8 C) 98.3 F (36.8 C) 98.2 F (36.8 C) 97.9 F (36.6 C)  TempSrc: Oral Oral Oral Oral  SpO2: 93% 94% 94% 95%  Weight:  83.3 kg    Height:        Intake/Output Summary (Last 24 hours) at 05/05/2019 1058 Last data filed at 05/05/2019 0340 Gross per 24 hour  Intake 1008.5 ml  Output --  Net 1008.5 ml   Filed Weights   05/04/19 2306 05/05/19 0300  Weight: 83.3 kg 83.3 kg    Examination:  General exam: Appears calm and comfortable.  Elderly female lying in bed.  Drowsy, wakes up only very slightly.  Confused slightly to time. Respiratory system: Bilateral decreased breath sounds at bases with scattered crackles Cardiovascular system: S1 & S2 heard, Rate controlled Gastrointestinal system: Abdomen is nondistended, soft and nontender. Normal bowel sounds heard. Extremities: No cyanosis, clubbing; trace lower extremity edema present Central nervous system: Drowsy, wakes up only very slightly, confused to time. No focal neurological deficits. Moving extremities Skin: No rashes, lesions or ulcers Psychiatry: Could not be assessed  because of mental status   Data Reviewed: I have personally reviewed following labs and imaging studies  CBC: Recent Labs  Lab 05/04/19 1621 05/04/19 1630  WBC 20.7*  --   NEUTROABS 15.9*  --   HGB 4.7* 5.1*  HCT 15.9* 15.0*  MCV 101.3*  --   PLT 307  --     Basic Metabolic Panel: Recent Labs  Lab 05/04/19 1621 05/04/19 1630 05/05/19 0727  NA 137 131* 136  K 3.5 3.3* 3.6  CL 93* 93* 93*  CO2 26  --  25  GLUCOSE 113* 113* 82  BUN 11 12 15   CREATININE 3.17* 3.20* 3.87*  CALCIUM 8.9  --  9.6  PHOS  --   --  7.3*   GFR: Estimated Creatinine Clearance: 12.3 mL/min (A) (by C-G formula based on SCr of 3.87 mg/dL (H)). Liver Function Tests: Recent Labs  Lab 05/04/19 1621 05/05/19 0727  AST 17 14*  ALT 11 13  ALKPHOS 104 77  BILITOT 1.3* 1.1  PROT 6.6 5.6*  ALBUMIN 3.1* 2.7*   No results for input(s): LIPASE, AMYLASE in the last 168 hours. No results for input(s): AMMONIA in the last 168 hours. Coagulation Profile: Recent Labs  Lab 05/04/19 1621  INR 1.0   Cardiac Enzymes: No results for input(s): CKTOTAL, CKMB, CKMBINDEX, TROPONINI in the last 168 hours. BNP (last 3 results) Recent Labs    04/10/19 1418  PROBNP 81.0   HbA1C: No results for input(s): HGBA1C in the last 72 hours. CBG: No results for input(s): GLUCAP in the last 168 hours. Lipid Profile: No results for input(s): CHOL, HDL, LDLCALC, TRIG, CHOLHDL, LDLDIRECT in the last 72 hours. Thyroid Function Tests: No results for input(s): TSH, T4TOTAL, FREET4, T3FREE, THYROIDAB in the last 72 hours. Anemia Panel: Recent Labs    05/04/19 1621  VITAMINB12 520  FOLATE 8.8  FERRITIN 930*  TIBC 269  IRON 46  RETICCTPCT 2.7   Sepsis Labs: No results for input(s): PROCALCITON, LATICACIDVEN in the last 168 hours.  Recent Results (from the past 240 hour(s))  SARS CORONAVIRUS 2 (TAT 6-24 HRS) Nasopharyngeal Nasopharyngeal Swab     Status: None   Collection Time: 05/04/19 10:28 PM   Specimen: Nasopharyngeal Swab  Result Value Ref Range Status   SARS Coronavirus 2 NEGATIVE NEGATIVE Final    Comment: (NOTE) SARS-CoV-2 target nucleic acids are NOT DETECTED. The SARS-CoV-2 RNA is generally detectable in upper and lower respiratory specimens during the acute  phase of infection. Negative results do not preclude SARS-CoV-2 infection, do not rule out co-infections with other pathogens, and should not be used as the sole basis for treatment or other patient management decisions. Negative results must be combined with clinical observations, patient history, and epidemiological information. The expected result is Negative. Fact Sheet for Patients: SugarRoll.be Fact Sheet for Healthcare Providers: https://www.woods-mathews.com/ This test is not yet approved or cleared by the [place] FDA and  has been authorized for detection and/or diagnosis of SARS-CoV-2 by FDA under an Emergency Use Authorization (EUA). This EUA will remain  in effect (meaning this test can be used) for the duration of the COVID-19 declaration under Section 56 4(b)(1) of the Act, 21 U.S.C. section 360bbb-3(b)(1), unless the authorization is terminated or revoked sooner. Performed at [facility], [place] [address] [place], [place] [address]          Radiology Studies: MR BRAIN WO CONTRAST  Result Date: 05/04/2019 CLINICAL DATA:  75 year old female with recent encephalopathy, ataxia.  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 EXAM: MRI HEAD WITHOUT CONTRAST TECHNIQUE: Multiplanar, multiecho pulse sequences of the brain and surrounding structures were obtained without intravenous contrast. COMPARISON:  Brain MRI 04/20/2019. FINDINGS: Brain: The tiny area of posterior right temporal lobe white matter diffusion abnormality has faded since last month. No new restricted diffusion to suggest acute infarction. No midline shift, mass effect, evidence of mass lesion, ventriculomegaly, extra-axial collection or acute intracranial hemorrhage. Cervicomedullary junction and pituitary are within normal limits. Stable gray and white matter signal throughout the brain. Widespread bilateral cerebral white matter and deep gray  nuclei T2 and FLAIR hyperintensity. Moderate similar T2 and FLAIR heterogeneity in the pons. No cortical encephalomalacia or chronic cerebral blood products identified. Cerebellum remains within normal limits. Vascular: Major intracranial vascular flow voids are stable. Skull and upper cervical spine: Negative for age visible cervical spine. Bone marrow signal is stable and within normal limits. Sinuses/Orbits: Stable, negative. Other: Persistent bilateral mastoid effusions. Negative nasopharynx. Other visible internal auditory structures appear grossly normal. Negative scalp and face soft tissues. IMPRESSION: 1. No acute intracranial abnormality. 2. 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 3. Otherwise stable brain with advanced signal changes most compatible with chronic small vessel disease. 4. Persistent mastoid effusions, most commonly postinflammatory. Electronically Signed   By: Genevie Ann M.D.   On: 05/04/2019 17:42   DG Chest Port 1 View  Result Date: 05/04/2019 CLINICAL DATA:  Altered mental status EXAM: PORTABLE CHEST 1 VIEW COMPARISON:  04/24/2019 FINDINGS: Right-sided central line tip in the proximal right atrium as seen previously. Patient rotated towards the right. Aortic atherosclerosis. Left lung shows resolution of atelectasis previously seen at the base. Widespread tiny reticular nodules remain visible, raising possibility of atypical pneumonia. Chronic volume loss persists at the right base. Fine reticular nodular opacities also visible on this side. IMPRESSION: Chronic volume loss at the right base. Improved aeration at the left base. Tiny reticulo nodular shadows throughout both lungs raising the possibility of atypical pneumonia. Electronically Signed   By: Nelson Chimes M.D.   On: 05/04/2019 16:51        Scheduled Meds: . Chlorhexidine Gluconate Cloth  6 each Topical Daily   Continuous Infusions: . sodium chloride Stopped (05/04/19 1810)           Aline August, MD [facility] 05/05/2019, 10:58 AM

## 2019-05-05 NOTE — Consult Note (Signed)
Consultation Note Date: 05/05/2019   Patient Name: Joan Mosley  DOB: [birth year]  MRN: [id number]  Age / Sex: 74 y.o., female  PCP: Nche, Charlene Brooke, NP Referring Physician: Aline August, MD  Reason for Consultation: Establishing goals of care  HPI/Patient Profile:  Per hospitalist note --> 74 year old female with history of end-stage renal disease on hemodialysis, diabetes mellitus type 2, chronic hypoxic respiratory failure on oxygen, hypothyroidism, recent COVID-19 infection about 2 months ago, diabetic neuropathy presented with generalized weakness on 05/04/2019 from dialysis unit; dialysis could not be done because of weakness and delirium.  In the ED, she was found to have hemoglobin of 4.7 with no active bleeding or any reported bleeding episodes; stool guaiac was negative. Nephrology was consulted.  8 hospital admissions in 10/2018.  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  [facility]scharged to [facility] after October admission  02/09/19 - 02/22/19 hospital admission with COVID-19 pneumonia.  At the time of admission she had missed 1 HD session and a week of Synthroid tablets as she was waiting for Rx refill.  Patient family declined suggested LTAC placement and wanted patient full code.  3/24 - 3/30 admission w small acute CVA.  Discharged home.  Joan Mosley is well known to the Palliative Care service. She was hospitalized in January with acute respiratory disease secondary to COVID-19, then in March for increased confusion and headache. Presently she is here in the setting of severe anemia.   Clinical Assessment and Goals of Care: I have reviewed medical records including EPIC notes, labs and imaging, received report from bedside RN, assessed the patient.    I met with Joan Mosley to further discuss  diagnosis prognosis, GOC, EOL wishes, disposition and options.   I introduced Palliative Medicine as specialized medical care for people living with serious illness. It focuses on providing relief from the symptoms and stress of a serious illness. The goal is to improve quality of life for both the patient and the family.  Ranika is originally from [place], she lived in [place] for fifteen years and was hoping to move to [place]. Gailya shares that her health started to worsen which contributed to her staying here in [place].  Patient lives with her son, Joan Mosley and daughter, Joan Mosley who provide assistance with all bADLs. She has two other sons who lives in [place], [place] and [place].  She said that she enjoys spending time with her family, but most enjoys her Insurance risk surveyor, Joan Mosley.  I asked Lashonne to tell me how her health had been as of recently. She told me that she felt it was stable. She expressed sadness that her daughter no longer wanted to care for her. She shared that she is aware that her daughter does not want to deal with her problems anymore. She said that she is amazed that she has spent her whole life caring for her children and they now want nothing to do with her. We explored  this concept further. I asked her where she would live if not with her daughter, Joan Mosley. She shared that she would have a place at her son, [place] home as he also lives in [place].   A detailed discussion was had today regarding advanced directives, patient son Joan Mosley is identified as her HCPOA on file. Concepts specific to code status, artifical feeding and hydration, continued IV antibiotics and rehospitalization were had. I described cardiopulmonary resuscitation as well as intubation and what that would look like in her instance, she had initially said she would not want this though I fear she was uncertain about this. I called her HCPOA who said he will ask his sister Joan Mosley, brother, [name] to come in to  confirm this in the interim we will not change any status.    Discussed the importance of continued conversation with family and their medical providers regarding overall plan of care and treatment options, ensuring decisions are within the context of the patients values and GOCs.  Decision Maker: Patient is able to speak for herself If unable to speak for self patients son, Joan Mosley is her HCPOA - [phone]  SUMMARY OF RECOMMENDATIONS Full Code  Full Scope  Family meeting to further discuss code status  TOC --> OP Palliative Care Services  Chaplain Consult  Code Status/Advance Care Planning:  Full  Symptom Management:  Muscular Weakness:                 - Physical Therapy Evaluation                 - Occupational Therapy Evaluation   Xerostomia:                 - Good oral care QShift                 - Encourage liquid intake  Delirium:                 - Delirium precautions                 - Get up during the day                 - Encourage a familiar face to remain present throughout the day                 - Keep blinds open and lights on during daylight hours                 - Minimize the use of opioids/benzodiazepines   Spiritual:                 - Chaplain consult  Active Health Conditions: Acute on chronic anemia of chronic disease End-stage renal disease on hemodialysis Diabetes mellitus type 2 Leukocytosis Chronic diastolic heart failure Hypothyroidism Chronic hypoxic respiratory failure COPD Generalized deconditioning  Palliative Prophylaxis:   Aspiration, Bowel Regimen, Delirium Protocol, Eye Care, Frequent Pain Assessment, Oral Care, Palliative Wound Care and Turn Reposition  Additional Recommendations (Limitations, Scope, Preferences):  Full Scope Treatment  Psycho-social/Spiritual:   Desire for further Chaplaincy support: Yes  Additional Recommendations: Caregiving  Support/Resources  Prognosis:   Unable to determine   Discharge Planning: To Be Determined     Primary Diagnoses: Present on Admission: . Symptomatic anemia . Hypothyroidism . COPD (chronic obstructive pulmonary disease) ([place]) . Chronic diastolic (congestive) heart failure (HCC)  I have reviewed the medical record, interviewed the patient and family, and examined the  patient. The following aspects are pertinent.  Past Medical History:  Diagnosis Date  . Arthritis   . COPD (chronic obstructive pulmonary disease) ([place])   . Diabetic neuropathy ([place])   . 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 Renal disorder    ESRD  . Thyroid disease   . TIA (transient ischemic attack)   . Type 2 diabetes mellitus with peripheral neuropathy (HCC)    Social History   Socioeconomic History  . Marital status: Widowed    Spouse name: Not on file  . Number of children: 5  . Years of education: Not on file  . Highest education level: Not on file  Occupational History    Comment: disabled  Tobacco Use  . Smoking status: Former Smoker    Packs/day: 1.00    Years: 35.00    Pack years: 35.00    Quit date: 03/29/1981    Years since quitting: 38.1  . Smokeless tobacco: Never Used  Substance and Sexual Activity  . Alcohol use: Never  . Drug use: Never  . Sexual activity: Not Currently  Other Topics Concern  . Not on file  Social History Narrative  . Not on file   Social Determinants of Health   Financial Resource Strain:   . Difficulty of Paying Living Expenses:   Food Insecurity:   . Worried About Charity fundraiser in the Last Year:   . Arboriculturist in the Last Year:   Transportation Needs:   . Film/video editor (Medical):   Marland Kitchen Lack of Transportation (Non-Medical):   Physical Activity:   . Days of Exercise per Week:   . Minutes of Exercise per Session:   Stress:   . Feeling of Stress :   Social Connections:   . Frequency of Communication with Friends and Family:   . Frequency of Social Gatherings with Friends and Family:    . Attends Religious Services:   . Active Member of Clubs or Organizations:   . Attends Archivist Meetings:   Marland Kitchen Marital Status:    Family History  Problem Relation Age of Onset  . Heart disease Mother   . Hypertension Mother   . Heart disease Father   . Hypertension Father    Scheduled Meds: . Chlorhexidine Gluconate Cloth  6 each Topical Daily  . insulin aspart  0-5 Units Subcutaneous QHS  . insulin aspart  0-6 Units Subcutaneous TID WC  . [START ON 05/06/2019] levothyroxine  112 mcg Oral QAC breakfast  . montelukast  10 mg Oral QHS   Continuous Infusions: . sodium chloride Stopped (05/04/19 1810)  . [START ON 05/06/2019] ferric gluconate (FERRLECIT/NULECIT) IV     PRN Meds:.acetaminophen **OR** acetaminophen, albuterol, calcium carbonate (dosed in mg elemental calcium), camphor-menthol **AND** hydrOXYzine, docusate sodium, feeding supplement (NEPRO CARB STEADY), ondansetron **OR** ondansetron (ZOFRAN) IV, oxymetazoline, sorbitol, zolpidem Medications Prior to Admission:  Prior to Admission medications   Medication Sig Start Date End Date Taking? Authorizing Provider  acetaminophen (TYLENOL) 500 MG tablet Take 1,000 mg by mouth every 6 (six) hours as needed (for arthritic pain).     [provider]  ascorbic acid (VITAMIN C) 500 MG tablet Take 1 tablet (500 mg total) by mouth daily. 02/23/19   Hosie Poisson, MD  aspirin EC 81 MG EC tablet Take 1 tablet (81 mg total) by mouth daily. 04/25/19   Hosie Poisson, MD  benzonatate (TESSALON) 200 MG capsule Take 1 capsule (200 mg total) by mouth  2 (two) times daily as needed for cough. 02/08/19   Nche, Charlene Brooke, NP  clonazePAM (KLONOPIN) 0.5 MG tablet TAKE 1/2 (ONE-HALF) TABLET BY MOUTH IN THE MORNING AND 1 IN THE EVENING 04/28/19   Ngetich, Dinah C, NP  Darbepoetin Alfa (ARANESP) 25 MCG/0.42ML SOSY injection Inject 0.42 mLs (25 mcg total) into the vein every Tuesday with hemodialysis. 11/22/18   Hosie Poisson, MD   docusate sodium (COLACE) 100 MG capsule Take 100 mg by mouth every morning.    [provider]  doxercalciferol (HECTOROL) 4 MCG/2ML injection Inject 1.5 mLs (3 mcg total) into the vein Every Tuesday,Thursday,and Saturday with dialysis. 04/25/19   Hosie Poisson, MD  FLUoxetine (PROZAC) 10 MG tablet Take 1 tablet (10 mg total) by mouth daily. 02/08/19   Nche, Charlene Brooke, NP  furosemide (LASIX) 80 MG tablet Take 80 mg by mouth.    [provider]  guaiFENesin (MUCINEX) 600 MG 12 hr tablet Take 1 tablet (600 mg total) by mouth 2 (two) times daily. 02/22/19   Hosie Poisson, MD  ipratropium-albuterol (DUONEB) 0.5-2.5 (3) MG/3ML SOLN Take 3 mLs by nebulization See admin instructions. Nebulize and inhale 3 ml's into the lungs every four hours, while awake    [provider]  levalbuterol (XOPENEX HFA) 45 MCG/ACT inhaler Inhale 1-2 puffs into the lungs every 4 (four) hours as needed for wheezing.    [provider]  levothyroxine (SYNTHROID) 112 MCG tablet Take 1 tablet (112 mcg total) by mouth daily before breakfast. 1 AM 04/13/19   Nche, Charlene Brooke, NP  lidocaine (LIDODERM) 5 % Place 1 patch onto the skin daily as needed (pain). Remove & Discard patch within 12 hours or as directed by MD    [provider]  Lubricants (K-Y LUBRICANT JELLY SENSITIVE EX) Place 1 application into both nostrils as needed (as directed- for lubrication).     [provider]  midodrine (PROAMATINE) 10 MG tablet Take 10 mg by mouth See admin instructions. Take 10 mg by mouth on Tuesday, Thursday, Saturday 45 minutes before dialysis    [provider]  montelukast (SINGULAIR) 10 MG tablet Take 10 mg by mouth at bedtime.     [provider]  multivitamin (RENA-VIT) TABS tablet Take 1 tablet by mouth at bedtime. 11/21/18   Hosie Poisson, MD  Nutritional Supplements (FEEDING SUPPLEMENT, NEPRO CARB STEADY,) LIQD Take 237 mLs by mouth 2 (two) times daily between  meals. 11/22/18   Hosie Poisson, MD  nystatin (MYCOSTATIN/NYSTOP) powder Apply 1 g topically 2 (two) times daily as needed (as directed- to any rashes).     [provider]  nystatin cream (MYCOSTATIN) Apply 1 application topically 2 (two) times daily as needed for dry skin.     [provider]  oxymetazoline (AFRIN) 0.05 % nasal spray Place 1 spray into both nostrils 2 (two) times daily as needed for congestion.    [provider]  senna (SENOKOT) 8.6 MG TABS tablet Take 1 tablet by mouth every morning.    [provider]  sevelamer carbonate (RENVELA) 800 MG tablet Take 1 tablet (800 mg total) by mouth as needed (with snacks). 02/22/19   Hosie Poisson, MD  SIMPLY SALINE NA Place 1-2 sprays into the nose as needed.    [provider]  VENTOLIN HFA 108 (90 Base) MCG/ACT inhaler Inhale 2 puffs into the lungs every 6 (six) hours as needed for wheezing or shortness of breath. 02/14/19   Nche, Charlene Brooke, NP  heparin  5000 UNIT/ML injection Inject 1.5 mLs (7,500 Units total) into the skin every 8 (eight) hours. Patient not taking: Reported on 04/20/2019 02/22/19 04/20/19  Hosie Poisson, MD   Allergies  Allergen Reactions  . Avelox [Moxifloxacin Hcl In Nacl] Other (See Comments)    Unknown reaction  . Codeine Anaphylaxis  . Penicillins Rash    Did it involve swelling of the face/tongue/throat, SOB, or low BP? Unk Did it involve sudden or severe rash/hives, skin peeling, or any reaction on the inside of your mouth or nose? Yes Did you need to seek medical attention at a hospital or doctor's office? Unk When did it last happen? Unk If all above answers are "NO", may proceed with cephalosporin use.   . Sulfa Antibiotics Other (See Comments)    Unknown reaction  . Dextromethorphan-Guaifenesin Other (See Comments)    Reported by [facility] - unknown reaction. Pt states she is not allergic to dextromethorphan-guaifenesin.  . Shellfish Allergy Hives, Itching  and Rash   Review of Systems  Constitutional: Positive for activity change and fatigue.  Respiratory: Positive for shortness of breath.   Neurological: Positive for weakness.   Physical Exam Vitals and nursing note reviewed.  HENT:     Head: Normocephalic.     Nose: Nose normal.     Mouth/Throat:     Mouth: Mucous membranes are dry.  Eyes:     Pupils: Pupils are equal, round, and reactive to light.  Cardiovascular:     Rate and Rhythm: Normal rate and regular rhythm.     Pulses: Normal pulses.  Pulmonary:     Effort: Pulmonary effort is normal.     Comments: 5LPM [place] Abdominal:     Palpations: Abdomen is soft.  Musculoskeletal:     Cervical back: Normal range of motion.  Skin:    General: Skin is warm and dry.     Capillary Refill: Capillary refill takes less than 2 seconds.     Coloration: Skin is pale.  Neurological:     Mental Status: She is alert and oriented to person, place, and time.  Psychiatric:        Mood and Affect: Mood normal.    Vital Signs: BP 104/64 (BP Location: Left Arm)   Pulse (!) 101   Temp 98.7 F (37.1 C) (Oral)   Resp (!) 22   Ht 5' 1"  (1.549 m)   Wt 83.3 kg   SpO2 92%   BMI 34.70 kg/m  Pain Scale: 0-10   Pain Score: 0-No pain  SpO2: SpO2: 92 % O2 Device:SpO2: 92 % O2 Flow Rate: .O2 Flow Rate (L/min): 6 L/min  IO: Intake/output summary:   Intake/Output Summary (Last 24 hours) at 05/05/2019 1728 Last data filed at 05/05/2019 1500 Gross per 24 hour  Intake 1008.5 ml  Output -  Net 1008.5 ml   LBM: Last BM Date: 05/04/19 Baseline Weight: Weight: 83.3 kg Most recent weight: Weight: 83.3 kg     Palliative Assessment/Data: 30%  Time In: 1715 Time Out: 1630 Time Total: 75 Greater than 50%  of this time was spent counseling and coordinating care related to the above assessment and plan.  Signed by: Rosezella Rumpf, NP   Please contact Palliative Medicine Team phone at [phone] for questions and concerns.  For individual  provider: See Shea Evans

## 2019-05-05 NOTE — ED Notes (Signed)
Care endorsed to [facility], [place]

## 2019-05-05 NOTE — Consult Note (Signed)
[facility] Consult: 9:56 AM 05/05/2019  LOS: 1 day    Referring Provider: Dr Starla Link  Primary Care Physician:  Lorayne Marek Charlene Brooke, NP of [facility] Primary Gastroenterologist:  None     Reason for Consultation:  Hgb 4.7.  FOBT negative.     HPI: Joan Mosley is a 74 y.o. female.  PMH ESRD, TTS hemodialysis.  IDDM.  Chronic respiratory failure, chronic 5 L home oxygen, nocturnal bipap.  Hypothyroidism, previous thyroidectomy. TIA.  Macrocytic anemia.  Dysphagia.  Morbid obesity.  Subdural hematoma 08/2018.  Nonambulatory/electric wheelchair mobile at best, total dependence for ADLs. Her daughter says pt had colonoscopy and EGD several years ago when she lived in [place] could have been as long as 7 years ago.  No recall of any details as to why this was performed or any of the findings. CT abdomen pelvis W/O contrast 12/24/2018 showed gallstones, moderate intra and extrahepatic biliary duct dilatation.  And findings s/o choledocholithiasis with small calcifications at the ampulla.  Rectal wall thickening with stranding/thickening in the left ischio anal fat, ?  Proctitis versus evolving perianal abscess.  LFTs were normal and no GI evaluation was pursued.  8 hospital admissions in 10/2018.  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  Dscharged to [facility] after October admission  02/09/19 - 02/22/19 hospital admission with COVID-19 pneumonia.  At the time of admission she had missed 1 HD session and a week of Synthroid tablets as she was waiting for Rx refill.  Patient family declined suggested LTAC placement and wanted patient full code. 3/24 - 3/30 admission w small acute CVA.  Discharged home.     Became lethargic,  altered at dialysis.  No HD started and EMS transported her to ED.  Blood pressure 135/63, HR 92, CBG 136.  For some reason she was not on her [place] oxygen.  EMS resumed oxygen and sats 100%. Hgb 4.7 (10.1 on 04/22/19).  MCV 101.    FOBT negative.  PRBCs x 3 >> Hgb not yet repeated.   Iron studies all wnl.   Folate, B12 ok.    No black, melenic, bloody stools.  No abdominal pain.  Occasional nonbloody emesis during or following dialysis.  Stomach feels "funny" when she eats cereal, daughter wonders if patient has lactose intolerance. She is on thickened liquids and soft diet for dysphagia.  No observed coughing, gagging with meals  No alcoholic beverages.  Lives with her daughter in [place].  Previously lived in [place], then [place] for 15 years before moving to [place] in 03/2018.  Family history negative for GI bleed, colorectal cancer, ulcers.  Past Medical History:  Diagnosis Date   Arthritis    COPD (chronic obstructive pulmonary disease) ([place])    Diabetic neuropathy ([place])    Oxygen dependent 03/30/2018   5L home O2   Renal disorder    ESRD   Thyroid disease    TIA (transient ischemic attack)    Type 2 diabetes mellitus with peripheral neuropathy (  [place])     Past Surgical History:  Procedure Laterality Date   ABDOMINAL HYSTERECTOMY     IR FLUORO GUIDE CV LINE RIGHT  11/14/2018   RECTOCELE REPAIR     REPLACEMENT TOTAL KNEE BILATERAL Bilateral 2008   THYROIDECTOMY     80%   VAGINAL WOUND CLOSURE / REPAIR      Prior to Admission medications   Medication Sig Start Date End Date Taking? Authorizing Provider  acetaminophen (TYLENOL) 500 MG tablet Take 1,000 mg by mouth every 6 (six) hours as needed (for arthritic pain).     [provider]  ascorbic acid (VITAMIN C) 500 MG tablet Take 1 tablet (500 mg total) by mouth daily. 02/23/19   Hosie Poisson, MD  aspirin EC 81 MG EC tablet Take 1 tablet (81 mg total) by mouth daily. 04/25/19   Hosie Poisson,  MD  benzonatate (TESSALON) 200 MG capsule Take 1 capsule (200 mg total) by mouth 2 (two) times daily as needed for cough. 02/08/19   Nche, Charlene Brooke, NP  clonazePAM (KLONOPIN) 0.5 MG tablet TAKE 1/2 (ONE-HALF) TABLET BY MOUTH IN THE MORNING AND 1 IN THE EVENING 04/28/19   Ngetich, Dinah C, NP  Darbepoetin Alfa (ARANESP) 25 MCG/0.42ML SOSY injection Inject 0.42 mLs (25 mcg total) into the vein every Tuesday with hemodialysis. 11/22/18   Hosie Poisson, MD  docusate sodium (COLACE) 100 MG capsule Take 100 mg by mouth every morning.    [provider]  doxercalciferol (HECTOROL) 4 MCG/2ML injection Inject 1.5 mLs (3 mcg total) into the vein Every Tuesday,Thursday,and Saturday with dialysis. 04/25/19   Hosie Poisson, MD  FLUoxetine (PROZAC) 10 MG tablet Take 1 tablet (10 mg total) by mouth daily. 02/08/19   Nche, Charlene Brooke, NP  furosemide (LASIX) 80 MG tablet Take 80 mg by mouth.    [provider]  guaiFENesin (MUCINEX) 600 MG 12 hr tablet Take 1 tablet (600 mg total) by mouth 2 (two) times daily. 02/22/19   Hosie Poisson, MD  ipratropium-albuterol (DUONEB) 0.5-2.5 (3) MG/3ML SOLN Take 3 mLs by nebulization See admin instructions. Nebulize and inhale 3 ml's into the lungs every four hours, while awake    [provider]  levalbuterol (XOPENEX HFA) 45 MCG/ACT inhaler Inhale 1-2 puffs into the lungs every 4 (four) hours as needed for wheezing.    [provider]  levothyroxine (SYNTHROID) 112 MCG tablet Take 1 tablet (112 mcg total) by mouth daily before breakfast. 1 AM 04/13/19   Nche, Charlene Brooke, NP  lidocaine (LIDODERM) 5 % Place 1 patch onto the skin daily as needed (pain). Remove & Discard patch within 12 hours or as directed by MD    [provider]  Lubricants (K-Y LUBRICANT JELLY SENSITIVE EX) Place 1 application into both nostrils as needed (as directed- for lubrication).     [provider]  midodrine (PROAMATINE) 10 MG tablet Take 10 mg by  mouth See admin instructions. Take 10 mg by mouth on Tuesday, Thursday, Saturday 45 minutes before dialysis    [provider]  montelukast (SINGULAIR) 10 MG tablet Take 10 mg by mouth at bedtime.     [provider]  multivitamin (RENA-VIT) TABS tablet Take 1 tablet by mouth at bedtime. 11/21/18   Hosie Poisson, MD  Nutritional Supplements (FEEDING SUPPLEMENT, NEPRO CARB STEADY,) LIQD Take 237 mLs by mouth 2 (two) times daily between meals. 11/22/18   Hosie Poisson, MD  nystatin (MYCOSTATIN/NYSTOP) powder Apply 1 g topically 2 (two) times daily  as needed (as directed- to any rashes).     [provider]  nystatin cream (MYCOSTATIN) Apply 1 application topically 2 (two) times daily as needed for dry skin.     [provider]  oxymetazoline (AFRIN) 0.05 % nasal spray Place 1 spray into both nostrils 2 (two) times daily as needed for congestion.    [provider]  senna (SENOKOT) 8.6 MG TABS tablet Take 1 tablet by mouth every morning.    [provider]  sevelamer carbonate (RENVELA) 800 MG tablet Take 1 tablet (800 mg total) by mouth as needed (with snacks). 02/22/19   Hosie Poisson, MD  SIMPLY SALINE NA Place 1-2 sprays into the nose as needed.    [provider]  VENTOLIN HFA 108 (90 Base) MCG/ACT inhaler Inhale 2 puffs into the lungs every 6 (six) hours as needed for wheezing or shortness of breath. 02/14/19   Nche, Charlene Brooke, NP  heparin 5000 UNIT/ML injection Inject 1.5 mLs (7,500 Units total) into the skin every 8 (eight) hours. Patient not taking: Reported on 04/20/2019 02/22/19 04/20/19  Hosie Poisson, MD    Scheduled Meds:  Chlorhexidine Gluconate Cloth  6 each Topical Daily   Infusions:  sodium chloride Stopped (05/04/19 1810)   PRN Meds: acetaminophen **OR** acetaminophen, calcium carbonate (dosed in mg elemental calcium), camphor-menthol **AND** hydrOXYzine, docusate sodium, feeding supplement (NEPRO CARB STEADY),  ondansetron **OR** ondansetron (ZOFRAN) IV, sorbitol, zolpidem   Allergies as of 05/04/2019 - Review Complete 05/04/2019  Allergen Reaction Noted   Avelox [moxifloxacin hcl in nacl] Other (See Comments) 03/30/2018   Codeine Anaphylaxis 03/30/2018   Penicillins Rash 03/30/2018   Sulfa antibiotics Other (See Comments) 03/30/2018   Dextromethorphan-guaifenesin Other (See Comments) 11/11/2018   Shellfish allergy Hives, Itching, and Rash 07/01/2018    Family History  Problem Relation Age of Onset   Heart disease Mother    Hypertension Mother    Heart disease Father    Hypertension Father     Social History   Socioeconomic History   Marital status: Widowed    Spouse name: Not on file   Number of children: 5   Years of education: Not on file   Highest education level: Not on file  Occupational History    Comment: disabled  Tobacco Use   Smoking status: Former Smoker    Packs/day: 1.00    Years: 35.00    Pack years: 35.00    Quit date: 03/29/1981    Years since quitting: 38.1   Smokeless tobacco: Never Used  Substance and Sexual Activity   Alcohol use: Never   Drug use: Never   Sexual activity: Not Currently  Other Topics Concern   Not on file  Social History Narrative   Not on file   Social Determinants of Health   Financial Resource Strain:    Difficulty of Paying Living Expenses:   Food Insecurity:    Worried About Charity fundraiser in the Last Year:    Arboriculturist in the Last Year:   Transportation Needs:    Film/video editor (Medical):    Lack of Transportation (Non-Medical):   Physical Activity:    Days of Exercise per Week:    Minutes of Exercise per Session:   Stress:    Feeling of Stress :   Social Connections:    Frequency of Communication with Friends and Family:    Frequency of Social Gatherings with Friends and Family:    Attends Religious Services:  Active Member of Clubs or Organizations:     Attends Music therapist:    Marital Status:   Intimate Partner Violence:    Fear of Current or Ex-Partner:    Emotionally Abused:    Physically Abused:    Sexually Abused:     REVIEW OF SYSTEMS: Constitutional: See HPI. ENT:  No nose bleeds Pulm: Chronic hypoxia and dyspnea with minimal effort. CV:  No palpitations, no LE edema.  No chest pain GU:  No hematuria, no frequency GI: See HPI. Heme: No excessive or unusual bleeding/bruising. Transfusions: Daughter thinks that many years ago patient may have had a blood transfusion but she is not sure. Neuro: Episodic confusion, baseline cognition impaired.  No headaches, no peripheral tingling or numbness.  No syncope, no seizures. Derm:  No itching, no rash or sores.  Endocrine:  No sweats or chills.  No polyuria or dysuria Immunization:   Travel:  None beyond local counties in last few months.    PHYSICAL EXAM: Vital signs in last 24 hours: Vitals:   05/05/19 0352 05/05/19 0718  BP: (!) 118/55 115/64  Pulse: 88 86  Resp: 19 20  Temp: 98.2 F (36.8 C) 97.9 F (36.6 C)  SpO2: 94% 95%   Wt Readings from Last 3 Encounters:  05/05/19 83.3 kg  04/27/19 80.2 kg  04/26/19 80.2 kg    General: Overweight, chronically ill, elderly WF Head: No facial asymmetry or signs of head trau Eyes: No scleral icterus.  No conjunctival pallor. Ears: Slightly hard of hearing Nose: No congestion or discharge Vidor cannula in place Mouth: Somewhat dry oral mucosa.  The bulk of her teeth are missing and remaining teeth in poor repair.  Tongue midline. Neck: Obese, no JVD, no masses, no thyromegaly Lungs: Diminished breath sounds globally.  No labored breathing at rest.  Dialysis catheter present in upper right chest. Heart: RRR.  No MRG.  S1, S2 present Abdomen: Obese, soft, nontender.  Striae prominent.  No significant bruising.  No HSM, masses, bruits, hernias.   Rectal: Deferred. Musc/Skeltl: Long scars on both knees  consistent with previous knee replacement. Extremities: Slight, nonpitting lower extremity edema. Neurologic: Oriented times year, "hospital", self.  At times answers questions inappropriately.  Follows commands.  Moves all 4 limbs.  No tremors.  Strength not tested. Skin: Dry/woody skin in the lower legs.  A few purpura on the arms.  No telangiectasia. Tattoos: None observed Nodes: No cervical adenopathy. Psych: Flat affect, cooperative.  Monosyllabic responses to questions  Intake/Output from previous day: 04/08 0701 - 04/09 0700 In: 1008.5 [Blood:1008.5] Out: -  Intake/Output this shift: No intake/output data recorded.  LAB RESULTS: Recent Labs    05/04/19 1621 05/04/19 1630  WBC 20.7*  --   HGB 4.7* 5.1*  HCT 15.9* 15.0*  PLT 307  --    BMET Lab Results  Component Value Date   NA 136 05/05/2019   NA 131 (L) 05/04/2019   NA 137 05/04/2019   K 3.6 05/05/2019   K 3.3 (L) 05/04/2019   K 3.5 05/04/2019   CL 93 (L) 05/05/2019   CL 93 (L) 05/04/2019   CL 93 (L) 05/04/2019   CO2 25 05/05/2019   CO2 26 05/04/2019   CO2 23 04/26/2019   GLUCOSE 82 05/05/2019   GLUCOSE 113 (H) 05/04/2019   GLUCOSE 113 (H) 05/04/2019   BUN 15 05/05/2019   BUN 12 05/04/2019   BUN 11 05/04/2019   CREATININE 3.87 (H) 05/05/2019   CREATININE  3.20 (H) 05/04/2019   CREATININE 3.17 (H) 05/04/2019   CALCIUM 9.6 05/05/2019   CALCIUM 8.9 05/04/2019   CALCIUM 9.5 04/26/2019   LFT Recent Labs    05/04/19 1621 05/05/19 0727  PROT 6.6 5.6*  ALBUMIN 3.1* 2.7*  AST 17 14*  ALT 11 13  ALKPHOS 104 77  BILITOT 1.3* 1.1   PT/INR Lab Results  Component Value Date   INR 1.0 05/04/2019   INR 1.0 04/15/2019   INR 1.1 09/08/2018   Hepatitis Panel No results for input(s): HEPBSAG, HCVAB, HEPAIGM, HEPBIGM in the last 72 hours. C-Diff No components found for: CDIFF Lipase     Component Value Date/Time   LIPASE 40 12/24/2018 1654    Drugs of Abuse  No results found for: LABOPIA,  COCAINSCRNUR, LABBENZ, AMPHETMU, THCU, LABBARB   RADIOLOGY STUDIES: MR BRAIN WO CONTRAST  Result Date: 05/04/2019 CLINICAL DATA:  74 year old female with recent encephalopathy, ataxia. 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 EXAM: MRI HEAD WITHOUT CONTRAST TECHNIQUE: Multiplanar, multiecho pulse sequences of the brain and surrounding structures were obtained without intravenous contrast. COMPARISON:  Brain MRI 04/20/2019. FINDINGS: Brain: The tiny area of posterior right temporal lobe white matter diffusion abnormality has faded since last month. No new restricted diffusion to suggest acute infarction. No midline shift, mass effect, evidence of mass lesion, ventriculomegaly, extra-axial collection or acute intracranial hemorrhage. Cervicomedullary junction and pituitary are within normal limits. Stable gray and white matter signal throughout the brain. Widespread bilateral cerebral white matter and deep gray nuclei T2 and FLAIR hyperintensity. Moderate similar T2 and FLAIR heterogeneity in the pons. No cortical encephalomalacia or chronic cerebral blood products identified. Cerebellum remains within normal limits. Vascular: Major intracranial vascular flow voids are stable. Skull and upper cervical spine: Negative for age visible cervical spine. Bone marrow signal is stable and within normal limits. Sinuses/Orbits: Stable, negative. Other: Persistent bilateral mastoid effusions. Negative nasopharynx. Other visible internal auditory structures appear grossly normal. Negative scalp and face soft tissues. IMPRESSION: 1. No acute intracranial abnormality. 2. 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 3. Otherwise stable brain with advanced signal changes most compatible with chronic small vessel disease. 4. Persistent mastoid effusions, most commonly postinflammatory. Electronically Signed   By: Genevie Ann M.D.   On: 05/04/2019 17:42   DG Chest Port 1  View  Result Date: 05/04/2019 CLINICAL DATA:  Altered mental status EXAM: PORTABLE CHEST 1 VIEW COMPARISON:  04/24/2019 FINDINGS: Right-sided central line tip in the proximal right atrium as seen previously. Patient rotated towards the right. Aortic atherosclerosis. Left lung shows resolution of atelectasis previously seen at the base. Widespread tiny reticular nodules remain visible, raising possibility of atypical pneumonia. Chronic volume loss persists at the right base. Fine reticular nodular opacities also visible on this side. IMPRESSION: Chronic volume loss at the right base. Improved aeration at the left base. Tiny reticulo nodular shadows throughout both lungs raising the possibility of atypical pneumonia. Electronically Signed   By: Nelson Chimes M.D.   On: 05/04/2019 16:51      IMPRESSION:   *   Anemia.  Acute on chronic.  FOBT negative.  S/p PRBC x 2.   Mircera q 2 weeks at HD.     *   ESRD  *    High rate O2 dependent chronic resp failure.  Several admissions for acute on chronic resp failure.    *   covid PNA Jan 2021.    *  DM w neuropathy.  Nonambulatory.  Requires max assist for bulk of her ADLs.  At this point dtr does not think the patient could even operate her electric wheelchair.  *    Chronic SDH.  Acute recurrence of encephalopathy.  Small acute CVA late 03/2019.  MR brain 4/8 shows fading of punctate right temporal lobe infarct, advanced chronic small vessel disease, no acute stroke  *   Neurogenic dysphagia, on soft diet, thick liquids per SLP   PLAN:     *   Given heme negative, no hx melena, bleeding per rectum, coffee-ground emesis etc. and her significant respiratory issues, not clear EGD and colonoscopy should be pursued.  High risk for sedation needed for endoscopic w-up.   Dr Havery Moros will see pt  *   Not on PPI at home and ? If necessary now with FOBT negative, lack of signif GI sxs.     Azucena Freed  05/05/2019, 9:56 AM Phone [phone]

## 2019-05-05 NOTE — Consult Note (Signed)
[facility] Renal Consultation Note  Indication for Consultation:  Management of ESRD/hemodialysis; anemia, hypertension/volume and secondary hyperparathyroidism  HPI: Joan Mosley is a 74 y.o. female with ESRD ( HD [facility]TS [facility] ) , COPD 5L O2 dependent, DM, hx COVID earlier this year, dementia, hx CVA, hypothyroidism.   She was  sent from HD unit yest with AMS ( lethargy)HAD 2hr .62min HD  . In ER   Afeb , bp 129/67 , 96% O2sat , hgb 4.7 ,glucose 11, BUN 12 creatinine 3.20 and calcium 0.90.  White count is 20.7 platelets 307.  Her reticulocyte percent is 2.7.  Patient admitted with symptomatic anemia.  Stool guaiacs negative  Last OP HGB  11.6  (04/27/19)                  HGB  9.7 ( 04/13/19 )  Last  Mircera 100  on 04/13/19                     HGB  10.0 (04/06/19)                        HGB   8.7 ( 03/30/19)   Noted She is a full Code.    Today seen in room alert and she is baseline MS and no cos / HGB 12 after 3 u Prbcs , GI seeing .Marland Kitchen      Past Medical History:  Diagnosis Date  . Arthritis   . COPD (chronic obstructive pulmonary disease) ([place])   . Diabetic neuropathy ([place])   . 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 Renal disorder    ESRD  . Thyroid disease   . TIA (transient ischemic attack)   . Type 2 diabetes mellitus with peripheral neuropathy [facility])     Past Surgical History:  Procedure Laterality Date  . ABDOMINAL HYSTERECTOMY    . IR FLUORO GUIDE CV LINE RIGHT  11/14/2018  . RECTOCELE REPAIR    . REPLACEMENT TOTAL KNEE BILATERAL Bilateral 2008  . THYROIDECTOMY     80%  . VAGINAL WOUND CLOSURE / REPAIR        Family History  Problem Relation Age of Onset  . Heart disease Mother   . Hypertension Mother   . Heart disease Father   . Hypertension Father       reports that she quit smoking about 38 years ago. She has a 35.00 pack-year smoking history. She has never used smokeless tobacco. She reports that she does not drink alcohol or use drugs.    Allergies  Allergen Reactions  . Avelox [Moxifloxacin Hcl In Nacl] Other (See Comments)    Unknown reaction  . Codeine Anaphylaxis  . Penicillins Rash    Did it involve swelling of the face/tongue/throat, SOB, or low BP? Unk Did it involve sudden or severe rash/hives, skin peeling, or any reaction on the inside of your mouth or nose? Yes Did you need to seek medical attention at a hospital or doctor's office? Unk When did it last happen? Unk If all above answers are "NO", may proceed with cephalosporin use.   . Sulfa Antibiotics Other (See Comments)    Unknown reaction  . Dextromethorphan-Guaifenesin Other (See Comments)    Reported by [facility] - unknown reaction. Pt states she is not allergic to dextromethorphan-guaifenesin.  . Shellfish Allergy Hives, Itching and Rash    Prior to Admission medications   Medication Sig Start Date End Date Taking?  Authorizing Provider  acetaminophen (TYLENOL) 500 MG tablet Take 1,000 mg by mouth every 6 (six) hours as needed (for arthritic pain).     [provider]  ascorbic acid (VITAMIN C) 500 MG tablet Take 1 tablet (500 mg total) by mouth daily. 02/23/19   Hosie Poisson, MD  aspirin EC 81 MG EC tablet Take 1 tablet (81 mg total) by mouth daily. 04/25/19   Hosie Poisson, MD  benzonatate (TESSALON) 200 MG capsule Take 1 capsule (200 mg total) by mouth 2 (two) times daily as needed for cough. 02/08/19   Nche, Charlene Brooke, NP  clonazePAM (KLONOPIN) 0.5 MG tablet TAKE 1/2 (ONE-HALF) TABLET BY MOUTH IN THE MORNING AND 1 IN THE EVENING 04/28/19   Ngetich, Dinah C, NP  Darbepoetin Alfa (ARANESP) 25 MCG/0.42ML SOSY injection Inject 0.42 mLs (25 mcg total) into the vein every Tuesday with hemodialysis. 11/22/18   Hosie Poisson, MD  docusate sodium (COLACE) 100 MG capsule Take 100 mg by mouth every morning.    [provider]  doxercalciferol (HECTOROL) 4 MCG/2ML injection Inject 1.5 mLs (3 mcg total) into the vein Every Tuesday,Thursday,and  Saturday with dialysis. 04/25/19   Hosie Poisson, MD  FLUoxetine (PROZAC) 10 MG tablet Take 1 tablet (10 mg total) by mouth daily. 02/08/19   Nche, Charlene Brooke, NP  furosemide (LASIX) 80 MG tablet Take 80 mg by mouth.    [provider]  guaiFENesin (MUCINEX) 600 MG 12 hr tablet Take 1 tablet (600 mg total) by mouth 2 (two) times daily. 02/22/19   Hosie Poisson, MD  ipratropium-albuterol (DUONEB) 0.5-2.5 (3) MG/3ML SOLN Take 3 mLs by nebulization See admin instructions. Nebulize and inhale 3 ml's into the lungs every four hours, while awake    [provider]  levalbuterol (XOPENEX HFA) 45 MCG/ACT inhaler Inhale 1-2 puffs into the lungs every 4 (four) hours as needed for wheezing.    [provider]  levothyroxine (SYNTHROID) 112 MCG tablet Take 1 tablet (112 mcg total) by mouth daily before breakfast. 1 AM 04/13/19   Nche, Charlene Brooke, NP  lidocaine (LIDODERM) 5 % Place 1 patch onto the skin daily as needed (pain). Remove & Discard patch within 12 hours or as directed by MD    [provider]  Lubricants (K-Y LUBRICANT JELLY SENSITIVE EX) Place 1 application into both nostrils as needed (as directed- for lubrication).     [provider]  midodrine (PROAMATINE) 10 MG tablet Take 10 mg by mouth See admin instructions. Take 10 mg by mouth on Tuesday, Thursday, Saturday 45 minutes before dialysis    [provider]  montelukast (SINGULAIR) 10 MG tablet Take 10 mg by mouth at bedtime.     [provider]  multivitamin (RENA-VIT) TABS tablet Take 1 tablet by mouth at bedtime. 11/21/18   Hosie Poisson, MD  Nutritional Supplements (FEEDING SUPPLEMENT, NEPRO CARB STEADY,) LIQD Take 237 mLs by mouth 2 (two) times daily between meals. 11/22/18   Hosie Poisson, MD  nystatin (MYCOSTATIN/NYSTOP) powder Apply 1 g topically 2 (two) times daily as needed (as directed- to any rashes).     [provider]  nystatin cream (MYCOSTATIN) Apply 1  application topically 2 (two) times daily as needed for dry skin.     [provider]  oxymetazoline (AFRIN) 0.05 % nasal spray Place 1 spray into both nostrils 2 (two) times daily as needed for congestion.    [provider]  senna (SENOKOT) 8.6 MG TABS tablet Take 1 tablet  by mouth every morning.    [provider]  sevelamer carbonate (RENVELA) 800 MG tablet Take 1 tablet (800 mg total) by mouth as needed (with snacks). 02/22/19   Hosie Poisson, MD  SIMPLY SALINE NA Place 1-2 sprays into the nose as needed.    [provider]  VENTOLIN HFA 108 (90 Base) MCG/ACT inhaler Inhale 2 puffs into the lungs every 6 (six) hours as needed for wheezing or shortness of breath. 02/14/19   Nche, Charlene Brooke, NP  heparin 5000 UNIT/ML injection Inject 1.5 mLs (7,500 Units total) into the skin every 8 (eight) hours. Patient not taking: Reported on 04/20/2019 02/22/19 04/20/19  Hosie Poisson, MD     Anti-infectives (From admission, onward)   None      Results for orders placed or performed during the hospital encounter of 05/04/19 (from the past 48 hour(s))  Ethanol     Status: None   Collection Time: 05/04/19  4:21 PM  Result Value Ref Range   Alcohol, Ethyl (B) <10 <10 mg/dL    Comment: (NOTE) Lowest detectable limit for serum alcohol is 10 mg/dL. For medical purposes only. Performed at [facility], [place] [address], [place], [place] [address]   Protime-INR     Status: None   Collection Time: 05/04/19  4:21 PM  Result Value Ref Range   Prothrombin Time 12.9 11.4 - 15.2 seconds   INR 1.0 0.8 - 1.2    Comment: (NOTE) INR goal varies based on device and disease states. Performed at [facility], [place] [address] [place], [place] [address]   APTT     Status: Abnormal   Collection Time: 05/04/19  4:21 PM  Result Value Ref Range   aPTT 90 (H) 24 - 36 seconds    Comment:        IF BASELINE aPTT IS ELEVATED, SUGGEST PATIENT RISK ASSESSMENT BE USED TO  DETERMINE APPROPRIATE ANTICOAGULANT THERAPY. Performed at [facility], [place] [address] [place] [address]   CBC     Status: Abnormal   Collection Time: 05/04/19  4:21 PM  Result Value Ref Range   WBC 20.7 (H) 4.0 - 10.5 K/uL   RBC 1.57 (L) 3.87 - 5.11 MIL/uL   Hemoglobin 4.7 (LL) 12.0 - 15.0 g/dL    Comment: REPEATED TO VERIFY THIS CRITICAL RESULT HAS VERIFIED AND BEEN CALLED TO H DOOLEY,RN BY WALTER BOND ON 04 08 2021 AT 2202, AND HAS BEEN READ BACK.     HCT 15.9 (L) 36.0 - 46.0 %   MCV 101.3 (H) 80.0 - 100.0 fL   MCH 29.9 26.0 - 34.0 pg   MCHC 29.6 (L) 30.0 - 36.0 g/dL   RDW 17.6 (H) 11.5 - 15.5 %   Platelets 307 150 - 400 K/uL   nRBC 0.0 0.0 - 0.2 %    Comment: Performed at [facility] [address], [place], [place] [address]  Differential     Status: Abnormal   Collection Time: 05/04/19  4:21 PM  Result Value Ref Range   Neutrophils Relative % 77 %   Neutro Abs 15.9 (H) 1.7 - 7.7 K/uL   Lymphocytes Relative 14 %   Lymphs Abs 2.8 0.7 - 4.0 K/uL   Monocytes Relative 7 %   Monocytes Absolute 1.5 (H) 0.1 - 1.0 K/uL   Eosinophils Relative 1 %   Eosinophils Absolute 0.3 0.0 - 0.5 K/uL   Basophils Relative 0 %   Basophils Absolute  0.1 0.0 - 0.1 K/uL   Immature Granulocytes 1 %   Abs Immature Granulocytes 0.24 (H) 0.00 - 0.07 K/uL    Comment: Performed at [facility], [place] [address] [place], [place] [address]  Comprehensive metabolic panel     Status: Abnormal   Collection Time: 05/04/19  4:21 PM  Result Value Ref Range   Sodium 137 135 - 145 mmol/L   Potassium 3.5 3.5 - 5.1 mmol/L   Chloride 93 (L) 98 - 111 mmol/L   CO2 26 22 - 32 mmol/L   Glucose, Bld 113 (H) 70 - 99 mg/dL    Comment: Glucose reference range applies only to samples taken after fasting for at least 8 hours.   BUN 11 8 - 23 mg/dL   Creatinine, Ser 3.17 (H) 0.44 - 1.00 mg/dL   Calcium 8.9 8.9 - 10.3 mg/dL   Total Protein 6.6 6.5 - 8.1 g/dL   Albumin 3.1 (L) 3.5 - 5.0 g/dL    AST 17 15 - 41 U/L   ALT 11 0 - 44 U/L   Alkaline Phosphatase 104 38 - 126 U/L   Total Bilirubin 1.3 (H) 0.3 - 1.2 mg/dL   GFR calc non Af Amer 14 (L) >60 mL/min   GFR calc Af Amer 16 (L) >60 mL/min   Anion gap 18 (H) 5 - 15    Comment: Performed at [facility] [address], [place], [place] [address]  Vitamin B12     Status: None   Collection Time: 05/04/19  4:21 PM  Result Value Ref Range   Vitamin B-12 520 180 - 914 pg/mL    Comment: (NOTE) This assay is not validated for testing neonatal or myeloproliferative syndrome specimens for Vitamin B12 levels. Performed at [facility], [place] [address] [place], [place] [address]   Folate     Status: None   Collection Time: 05/04/19  4:21 PM  Result Value Ref Range   Folate 8.8 >5.9 ng/mL    Comment: Performed at [facility] [address] [place], [place] [address]  Iron and TIBC     Status: None   Collection Time: 05/04/19  4:21 PM  Result Value Ref Range   Iron 46 28 - 170 ug/dL   TIBC 269 250 - 450 ug/dL   Saturation Ratios 17 10.4 - 31.8 %   UIBC 223 ug/dL    Comment: Performed at [facility], [place] [address] [place], [place] [address]  Ferritin     Status: Abnormal   Collection Time: 05/04/19  4:21 PM  Result Value Ref Range   Ferritin 930 (H) 11 - 307 ng/mL    Comment: Performed at [facility], [place] [address], [place], [place] [address]  Reticulocytes     Status: Abnormal   Collection Time: 05/04/19  4:21 PM  Result Value Ref Range   Retic Ct Pct 2.7 0.4 - 3.1 %   RBC. 1.58 (L) 3.87 - 5.11 MIL/uL   Retic Count, Absolute 42.0 19.0 - 186.0 K/uL   Immature Retic Fract 33.3 (H) 2.3 - 15.9 %    Comment: Performed at [facility] [address] [place], [place] [address]  I-stat chem 8, ED     Status: Abnormal   Collection Time: 05/04/19  4:30 PM  Result Value Ref Range   Sodium 131 (L) 135 - 145 mmol/L   Potassium 3.3 (L) 3.5 - 5.1 mmol/L  Chloride 93 (L) 98 - 111 mmol/L    BUN 12 8 - 23 mg/dL   Creatinine, Ser 3.20 (H) 0.44 - 1.00 mg/dL   Glucose, Bld 113 (H) 70 - 99 mg/dL    Comment: Glucose reference range applies only to samples taken after fasting for at least 8 hours.   Calcium, Ion 0.98 (L) 1.15 - 1.40 mmol/L   TCO2 29 22 - 32 mmol/L   Hemoglobin 5.1 (LL) 12.0 - 15.0 g/dL   HCT 15.0 (L) 36.0 - 46.0 %   Comment NOTIFIED PHYSICIAN   Type and screen     Status: None (Preliminary result)   Collection Time: 05/04/19  4:42 PM  Result Value Ref Range   ABO/RH(D) O POS    Antibody Screen NEG    Sample Expiration 05/07/2019,2359    Unit Number [id number]    Blood Component Type RED CELLS,LR    Unit division 00    Status of Unit ISSUED,FINAL    Transfusion Status OK TO TRANSFUSE    Crossmatch Result      Compatible Performed at [facility], [place] [address], [place], [place] [address]    Unit Number [id number]    Blood Component Type RED CELLS,LR    Unit division 00    Status of Unit ISSUED,FINAL    Transfusion Status OK TO TRANSFUSE    Crossmatch Result Compatible    Unit Number [id number]    Blood Component Type RED CELLS,LR    Unit division 00    Status of Unit ISSUED    Transfusion Status OK TO TRANSFUSE    Crossmatch Result Compatible   Prepare RBC (crossmatch)     Status: None   Collection Time: 05/04/19  5:40 PM  Result Value Ref Range   Order Confirmation      ORDER PROCESSED BY BLOOD BANK Performed at [facility], [place] [address] [place], [place] [address]   POC occult blood, ED     Status: None   Collection Time: 05/04/19  5:41 PM  Result Value Ref Range   Fecal Occult Bld NEGATIVE NEGATIVE  SARS CORONAVIRUS 2 (TAT 6-24 HRS) Nasopharyngeal Nasopharyngeal Swab     Status: None   Collection Time: 05/04/19 10:28 PM   Specimen: Nasopharyngeal Swab  Result Value Ref Range   SARS Coronavirus 2 NEGATIVE NEGATIVE    Comment: (NOTE) SARS-CoV-2 target nucleic acids are NOT DETECTED. The SARS-CoV-2 RNA is  generally detectable in upper and lower respiratory specimens during the acute phase of infection. Negative results do not preclude SARS-CoV-2 infection, do not rule out co-infections with other pathogens, and should not be used as the sole basis for treatment or other patient management decisions. Negative results must be combined with clinical observations, patient history, and epidemiological information. The expected result is Negative. Fact Sheet for Patients: SugarRoll.be Fact Sheet for Healthcare Providers: https://www.woods-mathews.com/ This test is not yet approved or cleared by the [place] FDA and  has been authorized for detection and/or diagnosis of SARS-CoV-2 by FDA under an Emergency Use Authorization (EUA). This EUA will remain  in effect (meaning this test can be used) for the duration of the COVID-19 declaration under Section 56 4(b)(1) of the Act, 21 U.S.C. section 360bbb-3(b)(1), unless the authorization is terminated or revoked sooner. Performed at [facility], [place] [address] [place], [place] [address]   Comprehensive metabolic panel     Status: Abnormal   Collection Time: 05/05/19  7:27 AM  Result Value  Ref Range   Sodium 136 135 - 145 mmol/L   Potassium 3.6 3.5 - 5.1 mmol/L   Chloride 93 (L) 98 - 111 mmol/L   CO2 25 22 - 32 mmol/L   Glucose, Bld 82 70 - 99 mg/dL    Comment: Glucose reference range applies only to samples taken after fasting for at least 8 hours.   BUN 15 8 - 23 mg/dL   Creatinine, Ser 3.87 (H) 0.44 - 1.00 mg/dL   Calcium 9.6 8.9 - 10.3 mg/dL   Total Protein 5.6 (L) 6.5 - 8.1 g/dL   Albumin 2.7 (L) 3.5 - 5.0 g/dL   AST 14 (L) 15 - 41 U/L   ALT 13 0 - 44 U/L   Alkaline Phosphatase 77 38 - 126 U/L   Total Bilirubin 1.1 0.3 - 1.2 mg/dL   GFR calc non Af Amer 11 (L) >60 mL/min   GFR calc Af Amer 12 (L) >60 mL/min   Anion gap 18 (H) 5 - 15    Comment: Performed at [facility] [address], [place], [place] [address]  Phosphorus     Status: Abnormal   Collection Time: 05/05/19  7:27 AM  Result Value Ref Range   Phosphorus 7.3 (H) 2.5 - 4.6 mg/dL    Comment: Performed at [facility] [address], [place], [place] [address]  CBC with Differential/Platelet     Status: Abnormal   Collection Time: 05/05/19 10:08 AM  Result Value Ref Range   WBC 8.0 4.0 - 10.5 K/uL   RBC 4.43 3.87 - 5.11 MIL/uL   Hemoglobin 12.6 12.0 - 15.0 g/dL    Comment: REPEATED TO VERIFY POST TRANSFUSION SPECIMEN    HCT 40.9 36.0 - 46.0 %   MCV 92.3 80.0 - 100.0 fL    Comment: REPEATED TO VERIFY POST TRANSFUSION SPECIMEN    MCH 28.4 26.0 - 34.0 pg   MCHC 30.8 30.0 - 36.0 g/dL   RDW 18.9 (H) 11.5 - 15.5 %   Platelets 153 150 - 400 K/uL   nRBC 0.0 0.0 - 0.2 %   Neutrophils Relative % 65 %   Neutro Abs 5.2 1.7 - 7.7 K/uL   Lymphocytes Relative 22 %   Lymphs Abs 1.7 0.7 - 4.0 K/uL   Monocytes Relative 9 %   Monocytes Absolute 0.7 0.1 - 1.0 K/uL   Eosinophils Relative 2 %   Eosinophils Absolute 0.2 0.0 - 0.5 K/uL   Basophils Relative 1 %   Basophils Absolute 0.1 0.0 - 0.1 K/uL   Immature Granulocytes 1 %   Abs Immature Granulocytes 0.06 0.00 - 0.07 K/uL    Comment: Performed at [facility] [address], [place], [place] [address]  Glucose, capillary     Status: Abnormal   Collection Time: 05/05/19 11:42 AM  Result Value Ref Range   Glucose-Capillary 116 (H) 70 - 99 mg/dL    Comment: Glucose reference range applies only to samples taken after fasting for at least 8 hours.   Comment 1 Notify RN    Comment 2 Document in Chart      ROS: see hpi  Physical Exam: Vitals:   05/05/19 0718 05/05/19 1145  BP: 115/64 105/64  Pulse: 86 94  Resp: 20 20  Temp: 97.9 F (36.6 C) 98.7 F (37.1 C)  SpO2: 95% 91%     General: alert ,chronically ill appearing elderly female NAD  HEENT: [place], MMM, eomi, not icteric  Neck:  no jvd Heart: RRR, no rub  Lungs: Dec r BASES  Bilat , nonlabored b  Abdomen: obese , Soft , NT, ND  Extremities: Bilat  Edema 1- 2 pulse  Skin: No overt rash Neuro: HOH, OX3, moves upper extrem , Chronicl lower extrem weakness  Dialysis Access:  R IJ perm cath   Dialysis Orders: Center: east on TTS . EDW 82 kg (closest  Wt 83 kg past several  txs) HD Bath 2k, 2ca  Time 4  Heparin NONE. Access R IJ Perm cath     Hec 3 mcg IV/HD Mircera 100 (last on 04/13/19  With HGB 11.6 (04/27/19 ?? )  TFS 22% 03/1819 (no venofer )     CXR = ?atypical pNA    Assessment/Plan  1. ESRD -  HD on TTS  , next hd tomor on schedule , K and vol ok till sat  2. Hypertension/volume  - some chronic LEE ,BEd bound ,nonambulatory ?dependent , no excess vol on cxr  3. Anemia  - 4.7 = 3 u prbcs given and  now  hgb 12.6 , monitor trend no esa , needs venofer with HD , GI seeing  4. Metabolic bone disease - vit d on hd , binder when pos  Phos 7.3 ( chronic ^^ as OP )  5. COPD - inhalers meds per admit , has home O2  6. HO SDH - MR brain 4/8 shows fading of punctate right temporal lobe infarct, advanced chronic small vessel disease, no acute stroke  Ernest Haber, PA-C [facility] [phone] 05/05/2019, 1:14 PM

## 2019-05-06 DIAGNOSIS — I5032 Chronic diastolic (congestive) heart failure: Secondary | ICD-10-CM | POA: Diagnosis not present

## 2019-05-06 DIAGNOSIS — N186 End stage renal disease: Secondary | ICD-10-CM | POA: Diagnosis not present

## 2019-05-06 DIAGNOSIS — Z7189 Other specified counseling: Secondary | ICD-10-CM | POA: Diagnosis not present

## 2019-05-06 DIAGNOSIS — Z515 Encounter for palliative care: Secondary | ICD-10-CM | POA: Diagnosis not present

## 2019-05-06 DIAGNOSIS — E1142 Type 2 diabetes mellitus with diabetic polyneuropathy: Secondary | ICD-10-CM

## 2019-05-06 DIAGNOSIS — J449 Chronic obstructive pulmonary disease, unspecified: Secondary | ICD-10-CM | POA: Diagnosis not present

## 2019-05-06 DIAGNOSIS — J9611 Chronic respiratory failure with hypoxia: Secondary | ICD-10-CM

## 2019-05-06 DIAGNOSIS — Z794 Long term (current) use of insulin: Secondary | ICD-10-CM

## 2019-05-06 LAB — COMPREHENSIVE METABOLIC PANEL
ALT: 11 U/L (ref 0–44)
AST: 13 U/L — ABNORMAL LOW (ref 15–41)
Albumin: 2.8 g/dL — ABNORMAL LOW (ref 3.5–5.0)
Alkaline Phosphatase: 84 U/L (ref 38–126)
Anion gap: 18 — ABNORMAL HIGH (ref 5–15)
BUN: 24 mg/dL — ABNORMAL HIGH (ref 8–23)
CO2: 25 mmol/L (ref 22–32)
Calcium: 9.6 mg/dL (ref 8.9–10.3)
Chloride: 93 mmol/L — ABNORMAL LOW (ref 98–111)
Creatinine, Ser: 5.17 mg/dL — ABNORMAL HIGH (ref 0.44–1.00)
GFR calc Af Amer: 9 mL/min — ABNORMAL LOW (ref >60)
GFR calc non Af Amer: 8 mL/min — ABNORMAL LOW (ref >60)
Glucose, Bld: 91 mg/dL (ref 70–99)
Potassium: 3.8 mmol/L (ref 3.5–5.1)
Sodium: 136 mmol/L (ref 135–145)
Total Bilirubin: 1.5 mg/dL — ABNORMAL HIGH (ref 0.3–1.2)
Total Protein: 5.8 g/dL — ABNORMAL LOW (ref 6.5–8.1)

## 2019-05-06 LAB — TYPE AND SCREEN
ABO/RH(D): O POS
Antibody Screen: NEGATIVE
Unit division: 0
Unit division: 0
Unit division: 0

## 2019-05-06 LAB — BPAM RBC
Blood Product Expiration Date: 202105082359
Blood Product Expiration Date: 202105082359
Blood Product Expiration Date: 202105082359
ISSUE DATE / TIME: 202104081803
ISSUE DATE / TIME: 202104082142
ISSUE DATE / TIME: 202104090110
Unit Type and Rh: 5100
Unit Type and Rh: 5100
Unit Type and Rh: 5100

## 2019-05-06 LAB — CBC WITH DIFFERENTIAL/PLATELET
Abs Immature Granulocytes: 0.07 10*3/uL (ref 0.00–0.07)
Basophils Absolute: 0.1 10*3/uL (ref 0.0–0.1)
Basophils Relative: 1 %
Eosinophils Absolute: 0.2 10*3/uL (ref 0.0–0.5)
Eosinophils Relative: 2 %
HCT: 42.5 % (ref 36.0–46.0)
Hemoglobin: 13.2 g/dL (ref 12.0–15.0)
Immature Granulocytes: 1 %
Lymphocytes Relative: 23 %
Lymphs Abs: 2.1 10*3/uL (ref 0.7–4.0)
MCH: 28.4 pg (ref 26.0–34.0)
MCHC: 31.1 g/dL (ref 30.0–36.0)
MCV: 91.4 fL (ref 80.0–100.0)
Monocytes Absolute: 0.8 10*3/uL (ref 0.1–1.0)
Monocytes Relative: 8 %
Neutro Abs: 6.1 10*3/uL (ref 1.7–7.7)
Neutrophils Relative %: 65 %
Platelets: 159 10*3/uL (ref 150–400)
RBC: 4.65 MIL/uL (ref 3.87–5.11)
RDW: 18.7 % — ABNORMAL HIGH (ref 11.5–15.5)
WBC: 9.3 10*3/uL (ref 4.0–10.5)
nRBC: 0 % (ref 0.0–0.2)

## 2019-05-06 LAB — GLUCOSE, CAPILLARY
Glucose-Capillary: 90 mg/dL (ref 70–99)
Glucose-Capillary: 74 mg/dL (ref 70–99)
Glucose-Capillary: 143 mg/dL — ABNORMAL HIGH (ref 70–99)
Glucose-Capillary: 124 mg/dL — ABNORMAL HIGH (ref 70–99)

## 2019-05-06 LAB — FOLATE: Folate: 10.8 ng/mL (ref >5.9)

## 2019-05-06 LAB — VITAMIN B12: Vitamin B-12: 439 pg/mL (ref 180–914)

## 2019-05-06 LAB — TSH: TSH: 6.92 u[IU]/mL — ABNORMAL HIGH (ref 0.350–4.500)

## 2019-05-06 LAB — AMMONIA: Ammonia: 31 umol/L (ref 9–35)

## 2019-05-06 MED ORDER — ALTEPLASE 2 MG IJ SOLR
INTRAMUSCULAR | Status: AC
  Filled 2019-05-06: qty 4

## 2019-05-06 NOTE — Evaluation (Signed)
Occupational Therapy Evaluation Patient Details Name: Joan Mosley MRN: [id number] DOB: 1945-10-23 Today's Date: 05/06/2019    History of Present Illness Joan Mosley is a 74 y.o. female with medical history significant of end-stage renal disease on hemodialysis, diabetes, chronic respiratory failure on oxygen, hypothyroidism, recent R CVA, recent COVID-19 infection about 2 months ago, diabetic neuropathy who was at dialysis today and got weak. Found to have hgb 4.7 in ED.    Clinical Impression   This 74 y/o female presents with the above. Per chart pt mostly bedbound as of recent and requiring assist for ADL tasks. Today pt requiring overall totalA+2 for completion of mobility tasks, tolerating lateral scoot transfer OOB to drop arm recliner and use of maximove for transfer back to bed (RN expressing concerns about pt being left up in chair). Pt requiring up to maxA for UB ADL, totalA for LB ADL at bed level. Pt very hard of hearing and difficulty answering questions/following commands. She will benefit from continued acute OT services and currently recommend follow up therapy services in SNF setting after discharge to progress pt's safety and independence with ADL and mobility.     Follow Up Recommendations  SNF    Equipment Recommendations  Other (comment);Wheelchair (measurements OT);Wheelchair cushion (measurements OT);Hospital bed(TBD)           Precautions / Restrictions Precautions Precautions: Fall Restrictions Weight Bearing Restrictions: No      Mobility Bed Mobility Overal bed mobility: Needs Assistance Bed Mobility: Rolling;Supine to Sit;Sit to Supine Rolling: Total assist;+2 for physical assistance   Supine to sit: Total assist;+2 for physical assistance Sit to supine: Total assist;+2 for physical assistance   General bed mobility comments: posterior R lean in sitting. +2 tot  A for all mobility  Transfers Overall transfer level: Needs assistance Equipment  used: None;Ambulation equipment used Transfers: Lateral/Scoot Transfers          Lateral/Scoot Transfers: +2 physical assistance;Total assist General transfer comment: pt attempted to stand with +2 tot A but unable to sufficiently push through LE's to achieve even partial stand. Lateral scoot transfer into chair with +2 tot A. PT/OT then spoke with nursing who reported pt has been pulling lines out and getting very confused and was unsafe in chair. Maximove used for return to bed. pt had large BM on way back to bed, RN aware. Pt cleaned up and positioned in bed    Balance Overall balance assessment: Needs assistance Sitting-balance support: Bilateral upper extremity supported;Feet supported Sitting balance-Leahy Scale: Poor Sitting balance - Comments: mod to max A to maintain sitting due to R and post lean Postural control: Posterior lean;Right lateral lean     Standing balance comment: unable                           ADL either performed or assessed with clinical judgement   ADL Overall ADL's : Needs assistance/impaired Eating/Feeding: Moderate assistance;Bed level   Grooming: Minimal assistance;Moderate assistance;Bed level   Upper Body Bathing: Maximal assistance;Bed level   Lower Body Bathing: Total assistance;Bed level   Upper Body Dressing : Maximal assistance;Bed level   Lower Body Dressing: Total assistance;Bed level       Toileting- Clothing Manipulation and Hygiene: Total assistance;Bed level       Functional mobility during ADLs: Total assistance;+2 for physical assistance  Pertinent Vitals/Pain Pain Assessment: Faces Faces Pain Scale: Hurts even more Pain Location: R knee with flexion Pain Descriptors / Indicators: Grimacing;Moaning Pain Intervention(s): Limited activity within patient's tolerance;Monitored during session;Repositioned     Hand Dominance Right   Extremity/Trunk Assessment Upper Extremity  Assessment Upper Extremity Assessment: Generalized weakness   Lower Extremity Assessment Lower Extremity Assessment: Defer to PT evaluation RLE Deficits / Details: PROM WFL, however, pt would not follow commands to perform AROM.  LLE Deficits / Details: PROM WFL, however, pt would not follow commands to perform AROM.    Cervical / Trunk Assessment Cervical / Trunk Assessment: Kyphotic   Communication Communication Communication: HOH   Cognition Arousal/Alertness: Awake/alert Behavior During Therapy: Flat affect Overall Cognitive Status: No family/caregiver present to determine baseline cognitive functioning                                 General Comments: inconsistent with command following, HOH so difficult to fully assess, per RN ahas been confused, pulled out IV/pulled telemetry stickers off earlier    General Comments  SPO2 noted 78% on 3L, O2 increased to 4L, sats into mid 80's and 88% end of session, RN made aware of increased O2 needs    Exercises     Shoulder Instructions      Home Living Family/patient expects to be discharged to:: Private residence Living Arrangements: Children Available Help at Discharge: Family;Available 24 hours/day Type of Home: House Home Access: Ramped entrance     Home Layout: One level     Bathroom Shower/Tub: Tub/shower unit;Walk-in shower         Home Equipment: Gilford Rile - 2 wheels;Cane - single point;Wheelchair - power;Electric scooter   Additional Comments: info from last admission      Prior Functioning/Environment Level of Independence: Needs assistance  Gait / Transfers Assistance Needed: has been nearly bedbound since covid ADL's / Homemaking Assistance Needed: has help with all ADL's  Communication / Swallowing Assistance Needed: very HOH and difficult to understand          OT Problem List: Decreased strength;Decreased range of motion;Decreased activity tolerance;Impaired balance (sitting and/or  standing);Decreased cognition;Decreased safety awareness;Decreased knowledge of use of DME or AE;Obesity;Impaired UE functional use;Cardiopulmonary status limiting activity      OT Treatment/Interventions: Self-care/ADL training;Therapeutic exercise;Neuromuscular education;DME and/or AE instruction;Therapeutic activities;Cognitive remediation/compensation;Patient/family education;Balance training    OT Goals(Current goals can be found in the care plan section) Acute Rehab OT Goals Patient Stated Goal: to go home OT Goal Formulation: With patient Time For Goal Achievement: 05/20/19 Potential to Achieve Goals: Fair  OT Frequency: Min 2X/week   Barriers to D/C:            Co-evaluation PT/OT/SLP Co-Evaluation/Treatment: Yes Reason for Co-Treatment: Complexity of the patient's impairments (multi-system involvement);For patient/therapist safety;To address functional/ADL transfers;Necessary to address cognition/behavior during functional activity PT goals addressed during session: Mobility/safety with mobility OT goals addressed during session: ADL's and self-care      AM-PAC OT "6 Clicks" Daily Activity     Outcome Measure Help from another person eating meals?: A Lot Help from another person taking care of personal grooming?: A Lot Help from another person toileting, which includes using toliet, bedpan, or urinal?: Total Help from another person bathing (including washing, rinsing, drying)?: A Lot Help from another person to put on and taking off regular upper body clothing?: A Lot Help from another person to put on and taking off regular  lower body clothing?: Total 6 Click Score: 10   End of Session Equipment Utilized During Treatment: Oxygen Nurse Communication: Mobility status;Need for lift equipment  Activity Tolerance: Patient tolerated treatment well Patient left: in bed;with call bell/phone within reach;with nursing/sitter in room(nurse tech)  OT Visit Diagnosis: Muscle  weakness (generalized) (M62.81);Other abnormalities of gait and mobility (R26.89);Other symptoms and signs involving cognitive function                Time: 8832-5498 OT Time Calculation (min): 39 min Charges:  OT General Charges $OT Visit: 1 Visit OT Evaluation $OT Eval Moderate Complexity: 1 Mod OT Treatments $Self Care/Home Management : 8-22 mins  Lou Cal, OT Acute Rehabilitation Services Pager [phone] Office [place] 05/06/2019, 6:01 PM

## 2019-05-06 NOTE — Progress Notes (Signed)
Patient ID: Joan Mosley, female   DOB: [birth year], 74 y.o.   MRN: [id number]  PROGRESS NOTE    Joan Mosley  UTM:[id number] DOB: 09-Aug-1945 DOA: 05/04/2019 PCP: Flossie Buffy, NP   Brief Narrative:  74 year old female with history of end-stage renal disease on hemodialysis, diabetes mellitus type 2, chronic hypoxic respiratory failure on oxygen, hypothyroidism, recent COVID-19 infection about 2 months ago, diabetic neuropathy presented with generalized weakness on 05/04/2019 from dialysis unit; dialysis could not be done because of weakness and delirium.  In the ED, she was found to have hemoglobin of 4.7 with no active bleeding or any reported bleeding episodes; stool guaiac was negative.  Nephrology was consulted.  Assessment & Plan:  Acute on chronic anemia of chronic disease -Presented with hemoglobin of 4.7.  Prior hemoglobin was 10.1 on 04/22/2019.  No overt signs of bleeding.  Stool for guaiac was negative. -Status post 3 units packed red cells transfusion on admission.   -Hemoglobin 13.2 today. -GI following.  End-stage renal disease on hemodialysis -Nephrology consulted.  Dialysis as per nephrology schedule  Diabetes mellitus type 2 -Monitor CBGs with SSI  Leukocytosis -Questionable cause.  Resolved Chronic diastolic heart failure -Currently compensated.  Volume managed by dialysis.  Hypothyroidism -Continue Synthroid  Chronic hypoxic respiratory failure COPD -Currently at baseline.  Continue oxygen supplementation. -Patient is supposed to be on BiPAP at night and as needed during daytime for drowsiness.  Refused BiPAP last night.  Generalized deconditioning -Overall prognosis is guarded to poor.  Patient used to be a DNR in the past but CODE STATUS was changed to full code few months ago -Palliative care consult for goals of care discussion  -PT eval -We will consult social worker for possible discharge planning back to SNF   DVT prophylaxis: SCDs.  Avoid  heparin because of anemia Code Status: Full Family Communication: Spoke to son/Ryan on phone on 05/05/2019 Disposition Plan: Possible discharge back to SNF in 24 to 48 hours once cleared by GI/nephrology and once bed is available.  Consultants: GI/nephrology/palliative care  Procedures: None  Antimicrobials: None   Subjective: Patient seen and examined at bedside undergoing dialysis.  Poor historian.  Wakes up slightly, still slightly confused.  No overnight fever or vomiting reported.  Patient refused BiPAP at night.  Objective: Vitals:   05/05/19 2050 05/05/19 2051 05/06/19 0428 05/06/19 0500  BP: 126/64  (!) 103/58   Pulse: 85 91 97   Resp: 20  19   Temp: 98.5 F (36.9 C)  97.8 F (36.6 C)   TempSrc: Oral  Oral   SpO2: 90%  93%   Weight:    83.7 kg  Height:        Intake/Output Summary (Last 24 hours) at 05/06/2019 0743 Last data filed at 05/05/2019 1500 Gross per 24 hour  Intake 0 ml  Output --  Net 0 ml   Filed Weights   05/04/19 2306 05/05/19 0300 05/06/19 0500  Weight: 83.3 kg 83.3 kg 83.7 kg    Examination:  General exam: No acute distress.  Elderly female lying in bed.  Sleepy, wakes up only very slightly, confused to time.  Very poor historian. Respiratory system: Bilateral decreased breath sounds at bases with some crackles.  No wheezing  cardiovascular system: Rate controlled, S1-S2 heard Gastrointestinal system: Abdomen is nondistended, soft and nontender.  Bowel sounds are heard  extremities: Bilateral lower extremity trace edema present.  No clubbing  Central nervous system: Moving extremities.  Still slightly confused to time.  Skin: No ulcers or lesions Psychiatry: Cannot assess because of mental status   Data Reviewed: I have personally reviewed following labs and imaging studies  CBC: Recent Labs  Lab 05/04/19 1621 05/04/19 1630 05/05/19 1008 05/06/19 0312  WBC 20.7*  --  8.0 9.3  NEUTROABS 15.9*  --  5.2 6.1  HGB 4.7* 5.1* 12.6 13.2   HCT 15.9* 15.0* 40.9 42.5  MCV 101.3*  --  92.3 91.4  PLT 307  --  153 144   Basic Metabolic Panel: Recent Labs  Lab 05/04/19 1621 05/04/19 1630 05/05/19 0727 05/06/19 0312  NA 137 131* 136 136  K 3.5 3.3* 3.6 3.8  CL 93* 93* 93* 93*  CO2 26  --  25 25  GLUCOSE 113* 113* 82 91  BUN 11 12 15  24*  CREATININE 3.17* 3.20* 3.87* 5.17*  CALCIUM 8.9  --  9.6 9.6  PHOS  --   --  7.3*  --    GFR: Estimated Creatinine Clearance: 9.2 mL/min (A) (by C-G formula based on SCr of 5.17 mg/dL (H)). Liver Function Tests: Recent Labs  Lab 05/04/19 1621 05/05/19 0727 05/06/19 0312  AST 17 14* 13*  ALT 11 13 11   ALKPHOS 104 77 84  BILITOT 1.3* 1.1 1.5*  PROT 6.6 5.6* 5.8*  ALBUMIN 3.1* 2.7* 2.8*   No results for input(s): LIPASE, AMYLASE in the last 168 hours. Recent Labs  Lab 05/06/19 0312  AMMONIA 31   Coagulation Profile: Recent Labs  Lab 05/04/19 1621  INR 1.0   Cardiac Enzymes: No results for input(s): CKTOTAL, CKMB, CKMBINDEX, TROPONINI in the last 168 hours. BNP (last 3 results) Recent Labs    04/10/19 1418  PROBNP 81.0   HbA1C: No results for input(s): HGBA1C in the last 72 hours. CBG: Recent Labs  Lab 05/05/19 1142 05/05/19 1634 05/05/19 2102 05/06/19 0630  GLUCAP 116* 107* 80 90   Lipid Profile: No results for input(s): CHOL, HDL, LDLCALC, TRIG, CHOLHDL, LDLDIRECT in the last 72 hours. Thyroid Function Tests: Recent Labs    05/06/19 0312  TSH 6.920*   Anemia Panel: Recent Labs    05/04/19 1621 05/06/19 0312  VITAMINB12 520 439  FOLATE 8.8 10.8  FERRITIN 930*  --   TIBC 269  --   IRON 46  --   RETICCTPCT 2.7  --    Sepsis Labs: No results for input(s): PROCALCITON, LATICACIDVEN in the last 168 hours.  Recent Results (from the past 240 hour(s))  SARS CORONAVIRUS 2 (TAT 6-24 HRS) Nasopharyngeal Nasopharyngeal Swab     Status: None   Collection Time: 05/04/19 10:28 PM   Specimen: Nasopharyngeal Swab  Result Value Ref Range Status    SARS Coronavirus 2 NEGATIVE NEGATIVE Final    Comment: (NOTE) SARS-CoV-2 target nucleic acids are NOT DETECTED. The SARS-CoV-2 RNA is generally detectable in upper and lower respiratory specimens during the acute phase of infection. Negative results do not preclude SARS-CoV-2 infection, do not rule out co-infections with other pathogens, and should not be used as the sole basis for treatment or other patient management decisions. Negative results must be combined with clinical observations, patient history, and epidemiological information. The expected result is Negative. Fact Sheet for Patients: SugarRoll.be Fact Sheet for Healthcare Providers: https://www.woods-mathews.com/ This test is not yet approved or cleared by the [place] FDA and  has been authorized for detection and/or diagnosis of SARS-CoV-2 by FDA under an Emergency Use Authorization (EUA). This EUA will remain  in effect (meaning this test  can be used) for the duration of the COVID-19 declaration under Section 56 4(b)(1) of the Act, 21 U.S.C. section 360bbb-3(b)(1), unless the authorization is terminated or revoked sooner. Performed at [facility], [place] [address] [place], [place] [address]          Radiology Studies: MR BRAIN WO CONTRAST  Result Date: 05/04/2019 CLINICAL DATA:  74 year old female with recent encephalopathy, ataxia. 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 EXAM: MRI HEAD WITHOUT CONTRAST TECHNIQUE: Multiplanar, multiecho pulse sequences of the brain and surrounding structures were obtained without intravenous contrast. COMPARISON:  Brain MRI 04/20/2019. FINDINGS: Brain: The tiny area of posterior right temporal lobe white matter diffusion abnormality has faded since last month. No new restricted diffusion to suggest acute infarction. No midline shift, mass effect, evidence of mass lesion, ventriculomegaly, extra-axial  collection or acute intracranial hemorrhage. Cervicomedullary junction and pituitary are within normal limits. Stable gray and white matter signal throughout the brain. Widespread bilateral cerebral white matter and deep gray nuclei T2 and FLAIR hyperintensity. Moderate similar T2 and FLAIR heterogeneity in the pons. No cortical encephalomalacia or chronic cerebral blood products identified. Cerebellum remains within normal limits. Vascular: Major intracranial vascular flow voids are stable. Skull and upper cervical spine: Negative for age visible cervical spine. Bone marrow signal is stable and within normal limits. Sinuses/Orbits: Stable, negative. Other: Persistent bilateral mastoid effusions. Negative nasopharynx. Other visible internal auditory structures appear grossly normal. Negative scalp and face soft tissues. IMPRESSION: 1. No acute intracranial abnormality. 2. 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 3. Otherwise stable brain with advanced signal changes most compatible with chronic small vessel disease. 4. Persistent mastoid effusions, most commonly postinflammatory. Electronically Signed   By: Genevie Ann M.D.   On: 05/04/2019 17:42   DG Chest Port 1 View  Result Date: 05/04/2019 CLINICAL DATA:  Altered mental status EXAM: PORTABLE CHEST 1 VIEW COMPARISON:  04/24/2019 FINDINGS: Right-sided central line tip in the proximal right atrium as seen previously. Patient rotated towards the right. Aortic atherosclerosis. Left lung shows resolution of atelectasis previously seen at the base. Widespread tiny reticular nodules remain visible, raising possibility of atypical pneumonia. Chronic volume loss persists at the right base. Fine reticular nodular opacities also visible on this side. IMPRESSION: Chronic volume loss at the right base. Improved aeration at the left base. Tiny reticulo nodular shadows throughout both lungs raising the possibility of atypical pneumonia.  Electronically Signed   By: Nelson Chimes M.D.   On: 05/04/2019 16:51        Scheduled Meds: . antiseptic oral rinse  15 mL Mouth Rinse BID  . Chlorhexidine Gluconate Cloth  6 each Topical Daily  . insulin aspart  0-5 Units Subcutaneous QHS  . insulin aspart  0-6 Units Subcutaneous TID WC  . levothyroxine  112 mcg Oral QAC breakfast  . montelukast  10 mg Oral QHS   Continuous Infusions: . sodium chloride Stopped (05/04/19 1810)  . ferric gluconate (FERRLECIT/NULECIT) IV            Aline August, MD [facility] 05/06/2019, 7:43 AM

## 2019-05-06 NOTE — Evaluation (Signed)
Physical Therapy Evaluation Patient Details Name: Joan Mosley MRN: [id number] DOB: 09-17-1945 Today's Date: 05/06/2019   History of Present Illness  Joan Mosley is a 74 y.o. female with medical history significant of end-stage renal disease on hemodialysis, diabetes, chronic respiratory failure on oxygen, hypothyroidism, recent R CVA, recent COVID-19 infection about 2 months ago, diabetic neuropathy who was at dialysis today and got weak. Found to have hgb 4.7 in ED.   Clinical Impression  Pt admitted with above diagnosis. Pt presents with confusion, lethargy, and extreme weakness. +2 tot A for bed mobility and lateral scoot transfer to chair. Unsafe to remain in chair so maximove used to return to bed.  Pt currently with functional limitations due to the deficits listed below (see PT Problem List). Pt will benefit from skilled PT to increase their independence and safety with mobility to allow discharge to the venue listed below.       Follow Up Recommendations SNF;Supervision/Assistance - 24 hour    Equipment Recommendations  None recommended by PT    Recommendations for Other Services       Precautions / Restrictions Precautions Precautions: Fall Restrictions Weight Bearing Restrictions: No      Mobility  Bed Mobility Overal bed mobility: Needs Assistance Bed Mobility: Rolling;Supine to Sit;Sit to Supine Rolling: Total assist;+2 for physical assistance   Supine to sit: Total assist;+2 for physical assistance Sit to supine: Total assist;+2 for physical assistance   General bed mobility comments: posterior R lean in sitting. +2 tot  A for all mobility  Transfers Overall transfer level: Needs assistance Equipment used: None;Ambulation equipment used Transfers: Lateral/Scoot Transfers          Lateral/Scoot Transfers: +2 physical assistance;Total assist General transfer comment: pt attempted to stand with +2 tot A but unable to sufficiently push through LE's to  achieve even partial stand. Lateral scoot transfer into chair with +2 tot A. PT/OT then spoke with nursing who reported pt has been pulling lines out and getting very confused and was unsafe in chair. Maximove used for return to bed. pt had large BM on way back to bed, RN aware. Pt cleaned up and positioned in bed  Ambulation/Gait             General Gait Details: unable  Stairs            Wheelchair Mobility    Modified Rankin (Stroke Patients Only)       Balance Overall balance assessment: Needs assistance Sitting-balance support: Bilateral upper extremity supported;Feet supported Sitting balance-Leahy Scale: Poor Sitting balance - Comments: mod to max A to maintain sitting due to R and post lean Postural control: Posterior lean;Right lateral lean     Standing balance comment: unable                             Pertinent Vitals/Pain Pain Assessment: Faces Faces Pain Scale: Hurts even more Pain Location: R knee with flexion Pain Descriptors / Indicators: Grimacing;Moaning Pain Intervention(s): Limited activity within patient's tolerance;Monitored during session    Home Living Family/patient expects to be discharged to:: Private residence Living Arrangements: Children Available Help at Discharge: Family;Available 24 hours/day Type of Home: House Home Access: Ramped entrance     Home Layout: One level Home Equipment: Walker - 2 wheels;Cane - single point;Wheelchair - power;Electric scooter Additional Comments: info from last admission    Prior Function Level of Independence: Needs assistance   Gait / Transfers Assistance Needed:  has been nearly bedbound since covid  ADL's / Homemaking Assistance Needed: has help with all ADL's         Hand Dominance   Dominant Hand: Right    Extremity/Trunk Assessment   Upper Extremity Assessment Upper Extremity Assessment: Defer to OT evaluation    Lower Extremity Assessment Lower Extremity  Assessment: Generalized weakness RLE Deficits / Details: PROM WFL, however, pt would not follow commands to perform AROM.  LLE Deficits / Details: PROM WFL, however, pt would not follow commands to perform AROM.     Cervical / Trunk Assessment Cervical / Trunk Assessment: Kyphotic  Communication   Communication: HOH  Cognition Arousal/Alertness: Awake/alert Behavior During Therapy: Flat affect Overall Cognitive Status: No family/caregiver present to determine baseline cognitive functioning                                        General Comments General comments (skin integrity, edema, etc.): SPO2 78% on 3L, O2 increased to 4L, sats into mid 80's    Exercises     Assessment/Plan    PT Assessment Patient needs continued PT services  PT Problem List Decreased strength;Decreased activity tolerance;Decreased range of motion;Decreased mobility;Decreased balance;Decreased knowledge of use of DME;Decreased safety awareness;Decreased knowledge of precautions;Decreased cognition       PT Treatment Interventions DME instruction;Functional mobility training;Therapeutic activities;Therapeutic exercise;Balance training;Cognitive remediation;Patient/family education;Neuromuscular re-education    PT Goals (Current goals can be found in the Care Plan section)  Acute Rehab PT Goals Patient Stated Goal: to go home PT Goal Formulation: Patient unable to participate in goal setting Time For Goal Achievement: 05/20/19 Potential to Achieve Goals: Fair    Frequency Min 2X/week   Barriers to discharge        Co-evaluation PT/OT/SLP Co-Evaluation/Treatment: Yes Reason for Co-Treatment: Complexity of the patient's impairments (multi-system involvement);Necessary to address cognition/behavior during functional activity;For patient/therapist safety PT goals addressed during session: Mobility/safety with mobility         AM-PAC PT "6 Clicks" Mobility  Outcome Measure Help  needed turning from your back to your side while in a flat bed without using bedrails?: Total Help needed moving from lying on your back to sitting on the side of a flat bed without using bedrails?: Total Help needed moving to and from a bed to a chair (including a wheelchair)?: Total Help needed standing up from a chair using your arms (e.g., wheelchair or bedside chair)?: Total Help needed to walk in hospital room?: Total Help needed climbing 3-5 steps with a railing? : Total 6 Click Score: 6    End of Session   Activity Tolerance: Patient limited by fatigue Patient left: in bed;with call bell/phone within reach;with nursing/sitter in room Nurse Communication: Mobility status PT Visit Diagnosis: Muscle weakness (generalized) (M62.81)    Time: 1510-1550 PT Time Calculation (min) (ACUTE ONLY): 40 min   Charges:   PT Evaluation $PT Eval Moderate Complexity: [place]  Pager [phone] Office [place] 05/06/2019, 4:00 PM

## 2019-05-06 NOTE — Progress Notes (Addendum)
Subjective:  Seen on Hd, no cos states  Will not take  Phos binder here or at home despite counseling   Objective Vital signs in last 24 hours: Vitals:   05/05/19 2050 05/05/19 2051 05/06/19 0428 05/06/19 0500  BP: 126/64  (!) 103/58   Pulse: 85 91 97   Resp: 20  19   Temp: 98.5 F (36.9 C)  97.8 F (36.6 C)   TempSrc: Oral  Oral   SpO2: 90%  93%   Weight:    83.7 kg  Height:       Weight change: 0.4 kg  Physical Exam: General:  on hd ,alert ,chronically ill appearing elderly female NAD  Heart: RRR, no rub  Lungs: ant  .Bilat , nonlabored   Abdomen: obese , Soft , NT, ND  Extremities: Bilat  Edema trace   Dialysis Access:  R IJ perm cath , bfr 200   Dialysis Orders: [facility]: [facility] on [facility] . EDW 82 kg (closest  Wt 83 kg past several  txs) HD Bath 2k, 2ca  Time 4  Heparin NONE. Access R IJ Perm cath     Hec 3 mcg IV/HD Mircera 100 (last on 04/13/19  With HGB 11.6 (04/27/19 ?? )  TFS 22% 03/1819 (no venofer )     CXR = ?atypical pNA    Problem/Plan:  1. ESRD -  HD on TTS   on schedule , K and vol ok / bun /cr ok , bfr 200 today fu trend , no hep 2/2 HO SDH CVA 2. Hypertension/volume  - some chronic LEE ,BEd bound ,nonambulatory ?dependent , no excess vol on cxr  3. Anemia  - 4.7 = 3 u prbcs given and  then  hgb 12.6 , >13 .2 this am ??lab error at admit /  monitor trend no esa , needs venofer with HD x3  , GI seeing  4. Metabolic bone disease - vit d on hd , binder when pos  Phos 7.3 ( chronic ^^ as OP ) refused binders  5. COPD - inhalers meds per admit , has home O2  6. HO SDH - MR brain 4/8 shows fading of punctate right temporal lobe infarct, advanced chronic small vessel   Possible dc soon with stable HGB   Ernest Haber, PA-C [facility] Beeper [phone] 05/06/2019,9:28 AM  LOS: 2 days   Labs: Basic Metabolic Panel: Recent Labs  Lab 05/04/19 1621 05/04/19 1621 05/04/19 1630 05/05/19 0727 05/06/19 0312  NA 137   < > 131* 136 136  K 3.5   < >  3.3* 3.6 3.8  CL 93*   < > 93* 93* 93*  CO2 26  --   --  25 25  GLUCOSE 113*   < > 113* 82 91  BUN 11   < > 12 15 24*  CREATININE 3.17*   < > 3.20* 3.87* 5.17*  CALCIUM 8.9  --   --  9.6 9.6  PHOS  --   --   --  7.3*  --    < > = values in this interval not displayed.   Liver Function Tests: Recent Labs  Lab 05/04/19 1621 05/05/19 0727 05/06/19 0312  AST 17 14* 13*  ALT 11 13 11   ALKPHOS 104 77 84  BILITOT 1.3* 1.1 1.5*  PROT 6.6 5.6* 5.8*  ALBUMIN 3.1* 2.7* 2.8*   No results for input(s): LIPASE, AMYLASE in the last 168 hours. Recent Labs  Lab 05/06/19 0312  AMMONIA  31   CBC: Recent Labs  Lab 05/04/19 1621 05/04/19 1621 05/04/19 1630 05/05/19 1008 05/06/19 0312  WBC 20.7*  --   --  8.0 9.3  NEUTROABS 15.9*  --   --  5.2 6.1  HGB 4.7*   < > 5.1* 12.6 13.2  HCT 15.9*   < > 15.0* 40.9 42.5  MCV 101.3*  --   --  92.3 91.4  PLT 307  --   --  153 159   < > = values in this interval not displayed.   Cardiac Enzymes: No results for input(s): CKTOTAL, CKMB, CKMBINDEX, TROPONINI in the last 168 hours. CBG: Recent Labs  Lab 05/05/19 1142 05/05/19 1634 05/05/19 2102 05/06/19 0630  GLUCAP 116* 107* 80 90    Studies/Results: MR BRAIN WO CONTRAST  Result Date: 05/04/2019 CLINICAL DATA:  74 year old female with recent encephalopathy, ataxia. 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 EXAM: MRI HEAD WITHOUT CONTRAST TECHNIQUE: Multiplanar, multiecho pulse sequences of the brain and surrounding structures were obtained without intravenous contrast. COMPARISON:  Brain MRI 04/20/2019. FINDINGS: Brain: The tiny area of posterior right temporal lobe white matter diffusion abnormality has faded since last month. No new restricted diffusion to suggest acute infarction. No midline shift, mass effect, evidence of mass lesion, ventriculomegaly, extra-axial collection or acute intracranial hemorrhage. Cervicomedullary junction and pituitary are within normal  limits. Stable gray and white matter signal throughout the brain. Widespread bilateral cerebral white matter and deep gray nuclei T2 and FLAIR hyperintensity. Moderate similar T2 and FLAIR heterogeneity in the pons. No cortical encephalomalacia or chronic cerebral blood products identified. Cerebellum remains within normal limits. Vascular: Major intracranial vascular flow voids are stable. Skull and upper cervical spine: Negative for age visible cervical spine. Bone marrow signal is stable and within normal limits. Sinuses/Orbits: Stable, negative. Other: Persistent bilateral mastoid effusions. Negative nasopharynx. Other visible internal auditory structures appear grossly normal. Negative scalp and face soft tissues. IMPRESSION: 1. No acute intracranial abnormality. 2. 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 3. Otherwise stable brain with advanced signal changes most compatible with chronic small vessel disease. 4. Persistent mastoid effusions, most commonly postinflammatory. Electronically Signed   By: Genevie Ann M.D.   On: 05/04/2019 17:42   DG Chest Port 1 View  Result Date: 05/04/2019 CLINICAL DATA:  Altered mental status EXAM: PORTABLE CHEST 1 VIEW COMPARISON:  04/24/2019 FINDINGS: Right-sided central line tip in the proximal right atrium as seen previously. Patient rotated towards the right. Aortic atherosclerosis. Left lung shows resolution of atelectasis previously seen at the base. Widespread tiny reticular nodules remain visible, raising possibility of atypical pneumonia. Chronic volume loss persists at the right base. Fine reticular nodular opacities also visible on this side. IMPRESSION: Chronic volume loss at the right base. Improved aeration at the left base. Tiny reticulo nodular shadows throughout both lungs raising the possibility of atypical pneumonia. Electronically Signed   By: Nelson Chimes M.D.   On: 05/04/2019 16:51   Medications: . sodium chloride  Stopped (05/04/19 1810)  . ferric gluconate (FERRLECIT/NULECIT) IV     . antiseptic oral rinse  15 mL Mouth Rinse BID  . Chlorhexidine Gluconate Cloth  6 each Topical Daily  . insulin aspart  0-5 Units Subcutaneous QHS  . insulin aspart  0-6 Units Subcutaneous TID WC  . levothyroxine  112 mcg Oral QAC breakfast  . montelukast  10 mg Oral QHS

## 2019-05-06 NOTE — Progress Notes (Signed)
Palliative Medicine Inpatient Follow Up Note   HPI: Per hospitalist note --> 74 year old female with history of end-stage renal disease on hemodialysis, diabetes mellitus type 2, chronic hypoxic respiratory failure on oxygen, hypothyroidism, recent COVID-19 infection about 2 months ago, diabetic neuropathy presented with generalized weakness on 05/04/2019 from dialysis unit;dialysis could not be done because of weakness and delirium. In the ED, she was found to have hemoglobin of 4.7 with no active bleeding or any reported bleeding episodes; stool guaiac was negative. Nephrology was consulted.  8 hospital admissions in 10/2018.Reasons for admission included exacerbation of respiratory problems and acute on chronicsubdural hematomas following falls, sepsis, pna. Dscharged to [facility] after October admission  02/09/19 - 1/27/21hospital admission with COVID-19 pneumonia. At the time of admission she had missed 1 HD session and a week of Synthroid tablets as she was waiting for Rx refill. Patient family declined suggested LTAC placement and wanted patient full code.  3/24 - 3/30 admission w small acute CVA. Discharged home.  Joan Mosley is well known to the Palliative Care service. She was hospitalized in January with acute respiratory disease secondary to COVID-19, then in March for increased confusion and headache. Presently she is here in the setting of severe anemia. Since admission she has received 3 units of PRBCs with correction of Hgb to 13.2.  Today's Discussion (05/06/2019): Chart reviewed. Met with Loralie this morning, she stated that she had slept well overnight. I again asked her about code status as she appeared more clear minded this morning. She stated that she wanted to think more about it and talk to her family. I shared that we are hopeful to have both her son and daughter come in to discuss this concept in person. She was amenable to that.   Discussed with  patient the importance of continued conversation with family and their  medical providers regarding overall plan of care and treatment options, ensuring decisions are within the context of the patients values and GOCs.  Questions and concerns addressed   Vital Signs Vitals:   05/05/19 2051 05/06/19 0428  BP:  (!) 103/58  Pulse: 91 97  Resp:  19  Temp:  97.8 F (36.6 C)  SpO2:  93%    Intake/Output Summary (Last 24 hours) at 05/06/2019 1017 Last data filed at 05/05/2019 1500 Gross per 24 hour  Intake 0 ml  Output -  Net 0 ml   Last Weight  Most recent update: 05/06/2019  5:53 AM   Weight  83.7 kg (184 lb 8.4 oz)           Physical Exam Vitals and nursing note reviewed.  HENT:     Head: Normocephalic.     Nose: Nose normal.     Mouth/Throat:     Mouth: Mucous membranes are dry.  Eyes:     Pupils: Pupils are equal, round, and reactive to light.  Cardiovascular:     Rate and Rhythm: Normal rate and regular rhythm.     Pulses: Normal pulses.  Pulmonary:     Effort: Pulmonary effort is normal.     Comments: 5LPM [place] Abdominal:     Palpations: Abdomen is soft.  Musculoskeletal:     Cervical back: Normal range of motion.  Skin:    General: Skin is warm and dry.     Capillary Refill: Capillary refill takes less than 2 seconds.     Coloration: Skin is pale.  Neurological:     Mental Status: She is alert and  oriented to person, place, and time.  Psychiatric:        Mood and Affect: Mood normal.   SUMMARY OF RECOMMENDATIONS Full Code  Full Scope  Family meeting to further discuss code status  TOC --> OP Palliative Care Services  Chaplain Consult  Code Status/Advance Care Planning:  Full  Symptom Management:  Muscular Weakness: - Physical Therapy Evaluation - Occupational Therapy Evaluation  Xerostomia: - Good oral care QShift - Encourage liquid intake  Delirium: -  Delirium precautions - Get up during the day - Encourage a familiar face to remain present throughout the day - Keep blinds open and lights on during daylight hours - Minimize the use of opioids/benzodiazepines  Spiritual: - Chaplain consult  Active Health Conditions: Acute on chronic anemia of chronic disease End-stage renal disease on hemodialysis Diabetes mellitus type 2 Leukocytosis Chronic diastolic heart failure Hypothyroidism Chronic hypoxic respiratory failure COPD Generalized deconditioning  Time Spent: 15 Greater than 50% of the time was spent in counseling and coordination of care ______________________________________________________________________________________ [place] Team Team Cell Phone: [phone] Please utilize secure chat with additional questions, if there is no response within 30 minutes please call the above phone number  Palliative Medicine Team providers are available by phone from 7am to 7pm daily and can be reached through the team cell phone.  Should this patient require assistance outside of these hours, please call the patient's attending physician.

## 2019-05-06 NOTE — Progress Notes (Signed)
Patient ID: Joan Mosley, female   DOB: 1945/11/18, 74 y.o.   MRN: [id number]    Progress Note   Subjective   Day # 2  CC;profound anemia  Hgb 4.7 up to 13.2 post 3 units Heme neg  Just returned from dialysis, no specific complaints.    Objective   Vital signs in last 24 hours: Temp:  [97.8 F (36.6 C)-98.7 F (37.1 C)] 97.8 F (36.6 C) (04/10 0428) Pulse Rate:  [85-101] 97 (04/10 0428) Resp:  [19-22] 19 (04/10 0428) BP: (103-126)/(58-64) 103/58 (04/10 0428) SpO2:  [90 %-93 %] 93 % (04/10 0428) Weight:  [83.7 kg] 83.7 kg (04/10 0500) Last BM Date: 05/04/19 General: Elderly white female in NAD Heart:  Regular rate and rhythm; no murmurs Lungs: Respirations even and unlabored, lungs CTA bilaterally Abdomen:  Soft, nontender and nondistended. Normal bowel sounds. Extremities:  Without edema. Neurologic:  Alert and oriented,  grossly normal neurologically. Psych:  Cooperative. Normal mood and affect.  Intake/Output from previous day: No intake/output data recorded. Intake/Output this shift: No intake/output data recorded.  Lab Results: Recent Labs    05/04/19 1621 05/04/19 1621 05/04/19 1630 05/05/19 1008 05/06/19 0312  WBC 20.7*  --   --  8.0 9.3  HGB 4.7*   < > 5.1* 12.6 13.2  HCT 15.9*   < > 15.0* 40.9 42.5  PLT 307  --   --  153 159   < > = values in this interval not displayed.   BMET Recent Labs    05/04/19 1621 05/04/19 1621 05/04/19 1630 05/05/19 0727 05/06/19 0312  NA 137   < > 131* 136 136  K 3.5   < > 3.3* 3.6 3.8  CL 93*   < > 93* 93* 93*  CO2 26  --   --  25 25  GLUCOSE 113*   < > 113* 82 91  BUN 11   < > 12 15 24*  CREATININE 3.17*   < > 3.20* 3.87* 5.17*  CALCIUM 8.9  --   --  9.6 9.6   < > = values in this interval not displayed.   LFT Recent Labs    05/06/19 0312  PROT 5.8*  ALBUMIN 2.8*  AST 13*  ALT 11  ALKPHOS 84  BILITOT 1.5*   PT/INR Recent Labs    05/04/19 1621  LABPROT 12.9  INR 1.0    Studies/Results: MR  BRAIN WO CONTRAST  Result Date: 05/04/2019 CLINICAL DATA:  74 year old female with recent encephalopathy, ataxia. 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 EXAM: MRI HEAD WITHOUT CONTRAST TECHNIQUE: Multiplanar, multiecho pulse sequences of the brain and surrounding structures were obtained without intravenous contrast. COMPARISON:  Brain MRI 04/20/2019. FINDINGS: Brain: The tiny area of posterior right temporal lobe white matter diffusion abnormality has faded since last month. No new restricted diffusion to suggest acute infarction. No midline shift, mass effect, evidence of mass lesion, ventriculomegaly, extra-axial collection or acute intracranial hemorrhage. Cervicomedullary junction and pituitary are within normal limits. Stable gray and white matter signal throughout the brain. Widespread bilateral cerebral white matter and deep gray nuclei T2 and FLAIR hyperintensity. Moderate similar T2 and FLAIR heterogeneity in the pons. No cortical encephalomalacia or chronic cerebral blood products identified. Cerebellum remains within normal limits. Vascular: Major intracranial vascular flow voids are stable. Skull and upper cervical spine: Negative for age visible cervical spine. Bone marrow signal is stable and within normal limits. Sinuses/Orbits: Stable, negative. Other: Persistent bilateral mastoid effusions.  Negative nasopharynx. Other visible internal auditory structures appear grossly normal. Negative scalp and face soft tissues. IMPRESSION: 1. No acute intracranial abnormality. 2. 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 3. Otherwise stable brain with advanced signal changes most compatible with chronic small vessel disease. 4. Persistent mastoid effusions, most commonly postinflammatory. Electronically Signed   By: Genevie Ann M.D.   On: 05/04/2019 17:42   DG Chest Port 1 View  Result Date: 05/04/2019 CLINICAL DATA:  Altered mental status EXAM:  PORTABLE CHEST 1 VIEW COMPARISON:  04/24/2019 FINDINGS: Right-sided central line tip in the proximal right atrium as seen previously. Patient rotated towards the right. Aortic atherosclerosis. Left lung shows resolution of atelectasis previously seen at the base. Widespread tiny reticular nodules remain visible, raising possibility of atypical pneumonia. Chronic volume loss persists at the right base. Fine reticular nodular opacities also visible on this side. IMPRESSION: Chronic volume loss at the right base. Improved aeration at the left base. Tiny reticulo nodular shadows throughout both lungs raising the possibility of atypical pneumonia. Electronically Signed   By: Nelson Chimes M.D.   On: 05/04/2019 16:51       Assessment / Plan:    #42 74 year old white female with end-stage renal disease, on dialysis, adult onset diabetes mellitus COPD and congestive heart failure, admitted with lethargy and found to be profoundly anemic with hemoglobin of 4.7.  Baseline about 10  No history of overt bleeding and stool heme-negative on admission. Iron studies normal  Hemoglobin 13.2 after 3 units of packed RBCs, higher than would be expected.  Plan; no evidence for active GI blood loss, do not plan endoscopic evaluation. Would continue to trend hemoglobin Query lab error with initial hemoglobin however 2 days previous hemoglobin was 5.1. Consider CT abdomen and pelvis to rule out intra-abdominal/retroperitoneal bleed   Principal Problem:   Symptomatic anemia Active Problems:   Dialysis patient ([place])   HOH (hard of hearing)   Type 2 diabetes mellitus with diabetic polyneuropathy, with long-term current use of insulin (HCC)   Hypothyroidism   ESRD on dialysis (HCC)   Weakness   COPD (chronic obstructive pulmonary disease) (HCC)   Chronic diastolic (congestive) heart failure ([place])     LOS: 2 days   Joan Engebretsen EsterwoodPA-C  05/06/2019, 11:22 AM

## 2019-05-07 DIAGNOSIS — N186 End stage renal disease: Secondary | ICD-10-CM | POA: Diagnosis not present

## 2019-05-07 DIAGNOSIS — Z515 Encounter for palliative care: Secondary | ICD-10-CM | POA: Diagnosis not present

## 2019-05-07 DIAGNOSIS — Z7189 Other specified counseling: Secondary | ICD-10-CM | POA: Diagnosis not present

## 2019-05-07 DIAGNOSIS — E1142 Type 2 diabetes mellitus with diabetic polyneuropathy: Secondary | ICD-10-CM | POA: Diagnosis not present

## 2019-05-07 DIAGNOSIS — I5032 Chronic diastolic (congestive) heart failure: Secondary | ICD-10-CM | POA: Diagnosis not present

## 2019-05-07 DIAGNOSIS — D649 Anemia, unspecified: Secondary | ICD-10-CM | POA: Diagnosis not present

## 2019-05-07 LAB — BASIC METABOLIC PANEL
Anion gap: 16 — ABNORMAL HIGH (ref 5–15)
BUN: 22 mg/dL (ref 8–23)
CO2: 24 mmol/L (ref 22–32)
Calcium: 9.9 mg/dL (ref 8.9–10.3)
Chloride: 97 mmol/L — ABNORMAL LOW (ref 98–111)
Creatinine, Ser: 5.17 mg/dL — ABNORMAL HIGH (ref 0.44–1.00)
GFR calc Af Amer: 9 mL/min — ABNORMAL LOW (ref >60)
GFR calc non Af Amer: 8 mL/min — ABNORMAL LOW (ref >60)
Glucose, Bld: 115 mg/dL — ABNORMAL HIGH (ref 70–99)
Potassium: 3.7 mmol/L (ref 3.5–5.1)
Sodium: 137 mmol/L (ref 135–145)

## 2019-05-07 LAB — CBC WITH DIFFERENTIAL/PLATELET
Abs Immature Granulocytes: 0.06 10*3/uL (ref 0.00–0.07)
Basophils Absolute: 0.1 10*3/uL (ref 0.0–0.1)
Basophils Relative: 1 %
Eosinophils Absolute: 0.2 10*3/uL (ref 0.0–0.5)
Eosinophils Relative: 2 %
HCT: 44.5 % (ref 36.0–46.0)
Hemoglobin: 13.8 g/dL (ref 12.0–15.0)
Immature Granulocytes: 1 %
Lymphocytes Relative: 23 %
Lymphs Abs: 2.1 10*3/uL (ref 0.7–4.0)
MCH: 29.2 pg (ref 26.0–34.0)
MCHC: 31 g/dL (ref 30.0–36.0)
MCV: 94.3 fL (ref 80.0–100.0)
Monocytes Absolute: 0.8 10*3/uL (ref 0.1–1.0)
Monocytes Relative: 9 %
Neutro Abs: 5.9 10*3/uL (ref 1.7–7.7)
Neutrophils Relative %: 64 %
Platelets: 148 10*3/uL — ABNORMAL LOW (ref 150–400)
RBC: 4.72 MIL/uL (ref 3.87–5.11)
RDW: 18.3 % — ABNORMAL HIGH (ref 11.5–15.5)
WBC: 9.1 10*3/uL (ref 4.0–10.5)
nRBC: 0 % (ref 0.0–0.2)

## 2019-05-07 LAB — GLUCOSE, CAPILLARY
Glucose-Capillary: 111 mg/dL — ABNORMAL HIGH (ref 70–99)
Glucose-Capillary: 110 mg/dL — ABNORMAL HIGH (ref 70–99)
Glucose-Capillary: 126 mg/dL — ABNORMAL HIGH (ref 70–99)
Glucose-Capillary: 141 mg/dL — ABNORMAL HIGH (ref 70–99)

## 2019-05-07 LAB — SARS CORONAVIRUS 2 (TAT 6-24 HRS): SARS Coronavirus 2: NEGATIVE

## 2019-05-07 NOTE — Progress Notes (Signed)
Anticoagulant aspirated 10cc from right subclavian HD cathter and flushed with 10cc NS. per Ernest Haber PA

## 2019-05-07 NOTE — NC FL2 (Signed)
[place] LEVEL OF CARE SCREENING TOOL     IDENTIFICATION  Patient Name: Joan Mosley Birthdate: 01-12-1946 Sex: female Admission Date (Current Location): 05/04/2019  [facility] and [place] Number:  Herbalist and Address:  [facility], [place] [address] [place], [place] [address]      Provider Number: [id number]  Attending Physician Name and Address:  Aline August, MD  Relative Name and Phone Number:       Current Level of Care: Hospital Recommended Level of Care: [place] Prior Approval Number:    Date Approved/Denied:   PASRR Number: [id number]  Discharge Plan: SNF    Current Diagnoses: Patient Active Problem List   Diagnosis Date Noted  . Symptomatic anemia 05/04/2019  . Neurocognitive deficits 04/27/2019  . Adult failure to thrive   . Acute encephalopathy 04/20/2019  . Stroke ([place]) 04/20/2019  . Dysphagia 03/29/2019  . Aspiration into airway   . Pneumonia due to COVID-19 virus 02/09/2019  . Hypotension 12/24/2018  . Aspiration pneumonia of right lower lobe ([place])   . COPD (chronic obstructive pulmonary disease) ([place]) 11/11/2018  . TIA (transient ischemic attack)   . HLD (hyperlipidemia)   . Type II diabetes mellitus with renal manifestations ([place])   . Depression with anxiety   . Advanced care planning/counseling discussion   . Goals of care, counseling/discussion   . Palliative care by specialist   . Pulmonary edema 09/08/2018  . Respiratory failure ([place]) 09/08/2018  . Weakness   . Chronic respiratory failure with hypoxia, on home O2 therapy ([place]) 09/06/2018  . Subdural hematoma ([place]) 08/22/2018  . Elevated troponin 08/22/2018  . ESRD on dialysis ([place]) 08/22/2018  . Anxiety 07/04/2018  . Counseling regarding advanced directives and goals of care 07/01/2018  . Full code status 07/01/2018  . Drug-induced constipation 07/01/2018  . Hemodialysis-associated hypotension 07/01/2018  . Dialysis  patient ([place]) 07/01/2018  . HOH (hard of hearing) 07/01/2018  . Stage 5 chronic kidney disease on chronic dialysis ([place]) 07/01/2018  . Type 2 diabetes mellitus with diabetic polyneuropathy, with long-term current use of insulin ([place]) 07/01/2018  . Hypothyroidism 07/01/2018  . Gait instability 07/01/2018  . Hyperkalemia 04/20/2018  . Cerebral infarction, unspecified ([place]) 04/05/2018  . Chronic diastolic (congestive) heart failure ([place]) 04/05/2018  . Diarrhea, unspecified 04/05/2018  . Pain, unspecified 04/05/2018  . Pruritus, unspecified 04/05/2018  . Shortness of breath 04/05/2018  . Coagulation defect, unspecified ([place]) 03/31/2018  . Anemia in chronic kidney disease 03/25/2018  . Iron deficiency anemia, unspecified 03/25/2018  . Secondary hyperparathyroidism of renal origin ([place]) 03/25/2018    Orientation RESPIRATION BLADDER Height & Weight     Self, Place, Time  O2, Other (Comment)([place] 6L nasal cannula; bipap at night) Incontinent Weight: 185 lb 3 oz (84 kg) Height:  5\' 1"  (154.9 cm)  BEHAVIORAL SYMPTOMS/MOOD NEUROLOGICAL BOWEL NUTRITION STATUS      Incontinent Diet(see DC summary)  AMBULATORY STATUS COMMUNICATION OF NEEDS Skin   Extensive Assist Verbally Other (Comment)(ecchymosis arms and abdomen, MASD buttocks and groin, skin tear thigh)                       Personal Care Assistance Level of Assistance  Bathing, Feeding, Dressing Bathing Assistance: Maximum assistance Feeding assistance: Limited assistance Dressing Assistance: Maximum assistance     Functional Limitations Info  Hearing, Speech, Sight Sight Info: Adequate Hearing Info: Impaired Speech Info: Adequate    SPECIAL CARE FACTORS FREQUENCY  PT (By  licensed PT), OT (By licensed OT)     PT Frequency: min 5x weekly OT Frequency: min 5x weekly            Contractures Contractures Info: Not present    Additional Factors Info  Code Status, Allergies Code Status Info: full Allergies Info: avelox  (moxifloxacin hcl nacl), codeine, penicllins, sulfa antibiotics, dextromethorphanguaifenesin, shellfish allergy           Current Medications (05/07/2019):  This is the current hospital active medication list Current Facility-Administered Medications  Medication Dose Route Frequency Provider Last Rate Last Admin  . 0.9 %  sodium chloride infusion  10 mL/hr Intravenous Once Drenda Freeze, MD   Stopped at 05/04/19 1810  . acetaminophen (TYLENOL) tablet 650 mg  650 mg Oral Q6H PRN Elwyn Reach, MD       Or  . acetaminophen (TYLENOL) suppository 650 mg  650 mg Rectal Q6H PRN Jonelle Sidle, Mohammad L, MD      . albuterol (PROVENTIL) (2.5 MG/3ML) 0.083% nebulizer solution 2.5 mg  2.5 mg Nebulization Q6H PRN Alekh, Kshitiz, MD      . antiseptic oral rinse (BIOTENE) solution 15 mL  15 mL Mouth Rinse BID Rosezella Rumpf, NP   15 mL at 05/05/19 2101  . calcium carbonate (dosed in mg elemental calcium) suspension 500 mg of elemental calcium  500 mg of elemental calcium Oral Q6H PRN Elwyn Reach, MD      . camphor-menthol (SARNA) lotion 1 application  1 application Topical H6D PRN Elwyn Reach, MD       And  . hydrOXYzine (ATARAX/VISTARIL) tablet 25 mg  25 mg Oral Q8H PRN Elwyn Reach, MD      . Chlorhexidine Gluconate Cloth 2 % PADS 6 each  6 each Topical Daily Elwyn Reach, MD   Stopped at 05/07/19 1000  . docusate sodium (ENEMEEZ) enema 283 mg  1 enema Rectal PRN Elwyn Reach, MD      . feeding supplement (NEPRO CARB STEADY) liquid 237 mL  237 mL Oral TID PRN Elwyn Reach, MD      . ferric gluconate (NULECIT) 62.5 mg in sodium chloride 0.9 % 100 mL IVPB  62.5 mg Intravenous Q T,Th,Sa-HD Ernest Haber, PA-C   Stopped at 05/06/19 1930  . insulin aspart (novoLOG) injection 0-5 Units  0-5 Units Subcutaneous QHS Alekh, Kshitiz, MD      . insulin aspart (novoLOG) injection 0-6 Units  0-6 Units Subcutaneous TID WC Alekh, Kshitiz, MD      . levothyroxine (SYNTHROID)  tablet 112 mcg  112 mcg Oral QAC breakfast Aline August, MD   112 mcg at 05/07/19 0542  . montelukast (SINGULAIR) tablet 10 mg  10 mg Oral QHS Aline August, MD   10 mg at 05/06/19 2107  . ondansetron (ZOFRAN) tablet 4 mg  4 mg Oral Q6H PRN Elwyn Reach, MD       Or  . ondansetron (ZOFRAN) injection 4 mg  4 mg Intravenous Q6H PRN Jonelle Sidle, Mohammad L, MD      . oxymetazoline (AFRIN) 0.05 % nasal spray 1 spray  1 spray Each Nare BID PRN Alekh, Kshitiz, MD      . sorbitol 70 % solution 30 mL  30 mL Oral PRN Jonelle Sidle, Mohammad L, MD      . zolpidem (AMBIEN) tablet 5 mg  5 mg Oral QHS PRN Elwyn Reach, MD         Discharge Medications: Please see  discharge summary for a list of discharge medications.  Relevant Imaging Results:  Relevant Lab Results:   Additional Information SS#: [id number]; HD at [facility], set with [facility] for transport to HD  [facility], LCSW

## 2019-05-07 NOTE — Progress Notes (Signed)
Patient ID: Joan Mosley, female   DOB: 08-29-1945, 74 y.o.   MRN: [id number]  PROGRESS NOTE    Joan Mosley  WUJ:[id number] DOB: 14-Aug-1945 DOA: 05/04/2019 PCP: Flossie Buffy, NP   Brief Narrative:  74 year old female with history of end-stage renal disease on hemodialysis, diabetes mellitus type 2, chronic hypoxic respiratory failure on oxygen, hypothyroidism, recent COVID-19 infection about 2 months ago, diabetic neuropathy presented with generalized weakness on 05/04/2019 from dialysis unit; dialysis could not be done because of weakness and delirium.  In the ED, she was found to have hemoglobin of 4.7 with no active bleeding or any reported bleeding episodes; stool guaiac was negative.  Nephrology was consulted.  Assessment & Plan:  Acute on chronic anemia of chronic disease -Presented with hemoglobin of 4.7.  Prior hemoglobin was 10.1 on 04/22/2019.  No overt signs of bleeding.  Stool for guaiac was negative. -Status post 3 units packed red cells transfusion on admission.   -Hemoglobin 13.8 today. -GI signed off on 05/06/2019  End-stage renal disease on hemodialysis -Nephrology consulted.  Dialysis as per nephrology schedule.  There was some problem with dialysis on 05/06/2019.  Diabetes mellitus type 2 -Monitor CBGs with SSI  Leukocytosis -Questionable cause.  Resolved  Chronic diastolic heart failure -Currently compensated.  Volume managed by dialysis.  Hypothyroidism -Continue Synthroid  Chronic hypoxic respiratory failure COPD -Currently at baseline.  Continue oxygen supplementation. -Patient is supposed to be on BiPAP at night and as needed during daytime for drowsiness.  Currently refusing BiPAP at night.  Generalized deconditioning -Overall prognosis is guarded to poor.  Patient used to be a DNR in the past but CODE STATUS was changed to full code few months ago -Palliative care following.  Still remains full code. -Social worker consulted for discharge planning  back to SNF.  DVT prophylaxis: SCDs.  Avoid heparin because of anemia Code Status: Full Family Communication: Spoke to son/Ryan on phone on 05/05/2019 Disposition Plan: Possible discharge back to SNF once bed is available.  Currently medically stable for discharge.   Consultants: GI/nephrology/palliative care  Procedures: None  Antimicrobials: None   Subjective: Patient seen and examined at bedside.  Poor historian.  No overnight fever, vomiting, black or bloody stools reported.  Much more awake this morning. Objective: Vitals:   05/06/19 1559 05/06/19 2021 05/07/19 0332 05/07/19 0438  BP: 103/69 130/79 103/66   Pulse: (!) 102 100 94   Resp: 19 20 20    Temp: 98.1 F (36.7 C) 98 F (36.7 C) 98.1 F (36.7 C)   TempSrc: Oral Oral Oral   SpO2: 94% 95% 92%   Weight:    84 kg  Height:        Intake/Output Summary (Last 24 hours) at 05/07/2019 0746 Last data filed at 05/06/2019 1930 Gross per 24 hour  Intake 105 ml  Output -  Net 105 ml   Filed Weights   05/05/19 0300 05/06/19 0500 05/07/19 0438  Weight: 83.3 kg 83.7 kg 84 kg    Examination:  General exam: No distress.  Elderly female lying in bed.  Very poor historian.  Much more awake this morning but still slightly confused. Respiratory system: Bilateral decreased breath sounds at bases with scattered crackles.   Cardiovascular system: S1-S2 heard, rate controlled Gastrointestinal system: Abdomen is nondistended, soft and nontender.  Normal bowel sounds heard  extremities: No clubbing or cyanosis.  Trace bilateral lower extremity edema present   Data Reviewed: I have personally reviewed following labs and imaging studies  CBC:  Recent Labs  Lab 05/04/19 1621 05/04/19 1630 05/05/19 1008 05/06/19 0312 05/07/19 0411  WBC 20.7*  --  8.0 9.3 9.1  NEUTROABS 15.9*  --  5.2 6.1 5.9  HGB 4.7* 5.1* 12.6 13.2 13.8  HCT 15.9* 15.0* 40.9 42.5 44.5  MCV 101.3*  --  92.3 91.4 94.3  PLT 307  --  153 159 148*   Basic  Metabolic Panel: Recent Labs  Lab 05/04/19 1621 05/04/19 1630 05/05/19 0727 05/06/19 0312 05/07/19 0411  NA 137 131* 136 136 137  K 3.5 3.3* 3.6 3.8 3.7  CL 93* 93* 93* 93* 97*  CO2 26  --  25 25 24   GLUCOSE 113* 113* 82 91 115*  BUN 11 12 15  24* 22  CREATININE 3.17* 3.20* 3.87* 5.17* 5.17*  CALCIUM 8.9  --  9.6 9.6 9.9  PHOS  --   --  7.3*  --   --    GFR: Estimated Creatinine Clearance: 9.2 mL/min (A) (by C-G formula based on SCr of 5.17 mg/dL (H)). Liver Function Tests: Recent Labs  Lab 05/04/19 1621 05/05/19 0727 05/06/19 0312  AST 17 14* 13*  ALT 11 13 11   ALKPHOS 104 77 84  BILITOT 1.3* 1.1 1.5*  PROT 6.6 5.6* 5.8*  ALBUMIN 3.1* 2.7* 2.8*   No results for input(s): LIPASE, AMYLASE in the last 168 hours. Recent Labs  Lab 05/06/19 0312  AMMONIA 31   Coagulation Profile: Recent Labs  Lab 05/04/19 1621  INR 1.0   Cardiac Enzymes: No results for input(s): CKTOTAL, CKMB, CKMBINDEX, TROPONINI in the last 168 hours. BNP (last 3 results) Recent Labs    04/10/19 1418  PROBNP 81.0   HbA1C: No results for input(s): HGBA1C in the last 72 hours. CBG: Recent Labs  Lab 05/05/19 2102 05/06/19 0630 05/06/19 1228 05/06/19 1624 05/06/19 2116  GLUCAP 80 90 74 143* 124*   Lipid Profile: No results for input(s): CHOL, HDL, LDLCALC, TRIG, CHOLHDL, LDLDIRECT in the last 72 hours. Thyroid Function Tests: Recent Labs    05/06/19 0312  TSH 6.920*   Anemia Panel: Recent Labs    05/04/19 1621 05/06/19 0312  VITAMINB12 520 439  FOLATE 8.8 10.8  FERRITIN 930*  --   TIBC 269  --   IRON 46  --   RETICCTPCT 2.7  --    Sepsis Labs: No results for input(s): PROCALCITON, LATICACIDVEN in the last 168 hours.  Recent Results (from the past 240 hour(s))  SARS CORONAVIRUS 2 (TAT 6-24 HRS) Nasopharyngeal Nasopharyngeal Swab     Status: None   Collection Time: 05/04/19 10:28 PM   Specimen: Nasopharyngeal Swab  Result Value Ref Range Status   SARS Coronavirus 2  NEGATIVE NEGATIVE Final    Comment: (NOTE) SARS-CoV-2 target nucleic acids are NOT DETECTED. The SARS-CoV-2 RNA is generally detectable in upper and lower respiratory specimens during the acute phase of infection. Negative results do not preclude SARS-CoV-2 infection, do not rule out co-infections with other pathogens, and should not be used as the sole basis for treatment or other patient management decisions. Negative results must be combined with clinical observations, patient history, and epidemiological information. The expected result is Negative. Fact Sheet for Patients: SugarRoll.be Fact Sheet for Healthcare Providers: https://www.woods-mathews.com/ This test is not yet approved or cleared by the [place] FDA and  has been authorized for detection and/or diagnosis of SARS-CoV-2 by FDA under an Emergency Use Authorization (EUA). This EUA will remain  in effect (meaning this test can be used)  for the duration of the COVID-19 declaration under Section 56 4(b)(1) of the Act, 21 U.S.C. section 360bbb-3(b)(1), unless the authorization is terminated or revoked sooner. Performed at [facility], [place] [address] [place], [place] [address]          Radiology Studies: No results found.      Scheduled Meds: . antiseptic oral rinse  15 mL Mouth Rinse BID  . Chlorhexidine Gluconate Cloth  6 each Topical Daily  . insulin aspart  0-5 Units Subcutaneous QHS  . insulin aspart  0-6 Units Subcutaneous TID WC  . levothyroxine  112 mcg Oral QAC breakfast  . montelukast  10 mg Oral QHS   Continuous Infusions: . sodium chloride Stopped (05/04/19 1810)  . ferric gluconate (FERRLECIT/NULECIT) IV Stopped (05/06/19 1930)          Aline August, MD [facility] 05/07/2019, 7:46 AM

## 2019-05-07 NOTE — Progress Notes (Signed)
CSW was alerted that patient was medically stable for discharge. CSW called [facility] today and yesterday and had to leave messages both times.  CSW followed up with patient's daughter Joan Mosley to make sure the plan was for her to return to [facility]. Joan Mosley stated that was the plan.  TOC will continue to follow for discharge planning needs.

## 2019-05-07 NOTE — Progress Notes (Signed)
  CSW spoke with [name] at facility and she stated that she need to confirm that they had an RN that could do an admission today. [name] stated that patient would need a new COVID. CSW alerted MD of discharge plans and new COVID.  TOC team will continue to assist with discharge planning needs.

## 2019-05-07 NOTE — Progress Notes (Addendum)
Palliative Medicine Inpatient Follow Up Note   HPI: Per hospitalist note --> 74 year old female with history of end-stage renal disease on hemodialysis, diabetes mellitus type 2, chronic hypoxic respiratory failure on oxygen, hypothyroidism, recent COVID-19 infection about 2 months ago, diabetic neuropathy presented with generalized weakness on 05/04/2019 from dialysis unit;dialysis could not be done because of weakness and delirium. In the ED, she was found to have hemoglobin of 4.7 with no active bleeding or any reported bleeding episodes; stool guaiac was negative. Nephrology was consulted.  8 hospital admissions in 10/2018.Reasons for admission included exacerbation of respiratory problems and acute on chronicsubdural hematomas following falls, sepsis, pna. Dscharged to [facility] after October admission  02/09/19 - 1/27/21hospital admission with COVID-19 pneumonia. At the time of admission she had missed 1 HD session and a week of Synthroid tablets as she was waiting for Rx refill. Patient family declined suggested LTAC placement and wanted patient full code.  3/24 - 3/30 admission w small acute CVA. Discharged home.  Joan Mosley is well known to the Palliative Care service. She was hospitalized in January with acute respiratory disease secondary to COVID-19, then in March for increased confusion and headache. Presently she is here in the setting of severe anemia. Since admission she has received 3 units of PRBCs with correction of Hgb to 13.2.  Today's Discussion (05/07/2019): Chart reviewed. Met with Joan Mosley this morning, she stated that she was ready to go.   Per my discussion with her son, Joan Mosley last evening plan to meet with her children Joan Mosley and Joan Mosley to further discuss code status this afternoon.   Per chart review it appears that the plan will be for discharge today back to skilled nursing.  Discussed with patient the importance of continued conversation with family  and their  medical providers regarding overall plan of care and treatment options, ensuring decisions are within the context of the patients values and GOCs.  Questions and concerns addressed   Vital Signs Vitals:   05/07/19 0332 05/07/19 0820  BP: 103/66 117/70  Pulse: 94 67  Resp: 20 20  Temp: 98.1 F (36.7 C) 97.7 F (36.5 C)  SpO2: 92% (!) 67%    Intake/Output Summary (Last 24 hours) at 05/07/2019 1113 Last data filed at 05/06/2019 1930 Gross per 24 hour  Intake 105 ml  Output --  Net 105 ml   Last Weight  Most recent update: 05/07/2019  4:38 AM   Weight  84 kg (185 lb 3 oz)           Physical Exam Vitals and nursing note reviewed.  HENT:     Head: Normocephalic.     Nose: Nose normal.     Mouth/Throat:     Mouth: Mucous membranes are dry.  Eyes:     Pupils: Pupils are equal, round, and reactive to light.  Cardiovascular:     Rate and Rhythm: Normal rate and regular rhythm.     Pulses: Normal pulses.  Pulmonary:     Effort: Pulmonary effort is normal.     Comments: 5LPM  Abdominal:     Palpations: Abdomen is soft.  Musculoskeletal:     Cervical back: Normal range of motion.  Skin:    General: Skin is warm and dry.     Capillary Refill: Capillary refill takes less than 2 seconds.     Coloration: Skin is pale.  Neurological:     Mental Status: She is alert and oriented to person, place, and time.  Psychiatric:  Mood and Affect: Mood normal.   SUMMARY OF RECOMMENDATIONS Full Code  Full Scope  Family meeting at 105 to further discuss code status  TOC --> OP Palliative Care Services  Chaplain Consult  Code Status/Advance Care Planning:  Full  Symptom Management:  Muscular Weakness: - Physical Therapy Evaluation - Occupational Therapy Evaluation  Xerostomia: - Good oral care QShift - Encourage liquid intake  Delirium: - Delirium  precautions - Get up during the day - Encourage a familiar face to remain present throughout the day - Keep blinds open and lights on during daylight hours - Minimize the use of opioids/benzodiazepines  Spiritual: - Chaplain consult  Active Health Conditions: Acute on chronic anemia of chronic disease End-stage renal disease on hemodialysis Diabetes mellitus type 2 Leukocytosis Chronic diastolic heart failure Hypothyroidism Chronic hypoxic respiratory failure COPD Generalized deconditioning  Time Spent: 25 Greater than 50% of the time was spent in counseling and coordination of care ______________________________________________________________________________________ [place] Team Team Cell Phone: [phone] Please utilize secure chat with additional questions, if there is no response within 30 minutes please call the above phone number  Palliative Medicine Team providers are available by phone from 7am to 7pm daily and can be reached through the team cell phone.  Should this patient require assistance outside of these hours, please call the patient's attending physician.  Addendum: I met with Joan Mosley, and Joan Mosley (via [place]) we discussed Joan Mosley's current health state. I brought up concern for after discharge home if Joan Mosley is unable to care for her. Per discussion with family if that were the case a decision would be made to either send Joan Mosley to live with Joan Mosley or for her to be placed in a long term care facility. At the present time we reviewed the plan for her to go to SNF to ideally get stronger.  We reviewed Joan Mosley's code status. This has been a topic that she seems to be wavering upon during this hospitalization. I gave them information and shared that often times the things we can do in medicine do not mean that we should do them. I  brought up the importance of quality of life considerations with anything that we do to patients. Joan Mosley thought about this topic for quite some time. She appeared overwhelmed and unable to make a decision at that time. Joan Mosley asked that we allow the family to discuss this topic amongst themselves. I shared that the Palliative team will be available to help in anyway we can.   Time Spent: 45 minutes

## 2019-05-07 NOTE — Progress Notes (Signed)
Subjective:  Pt with no  Sob or cos this am rounds, noted decr bfr on hd yest. Am labs ok and no vol issues on admit cxr  Objective Vital signs in last 24 hours: Vitals:   05/06/19 2021 05/07/19 0332 05/07/19 0438 05/07/19 0820  BP: 130/79 103/66  117/70  Pulse: 100 94  67  Resp: 20 20  20   Temp: 98 F (36.7 C) 98.1 F (36.7 C)  97.7 F (36.5 C)  TempSrc: Oral Oral  Axillary  SpO2: 95% 92%  (!) 67%  Weight:   84 kg   Height:       Weight change: 0.3 kg  Physical Exam: General: alert ,chronically ill appearing elderly female NAD Heart:RRR, no rub Lungs: CTA .Bilat , nonlabored  Abdomen:obese , Soft , NT, ND Extremities:Bilat Edema trace   Dialysis Access:R IJ perm cath  Dialysis Orders: [facility] EDW82 kg (closest Wt 83 kg past several txs)HD Bath 2k, 2caTime 4Heparin NONE. AccessR IJ Perm cath  Hec 33mcg IV/HD Mircera 100 (last on 04/13/19 With HGB 11.6 (04/27/19 ?? ) TFS 22% 03/1819 (no venofer )   CXR = ?atypical pNA  Problem/Plan:  1. ESRD -HD on TTS  on schedule , K and vol ok / bun /cr ok , bfr 200 yest.  f , no hep 2/2 HO SDH CVA stable for next OP HD Tues  2. Hypertension/volume - some chronic LEE ,BEd bound ,nonambulatory ?dependent , no excess vol on cxr 3. Anemia - 4.7 = 3 u prbcs given and then hgb 12.6 , >13 .2>13.8  this am ??lab error at admit /  monitor trend no esa , needs venofer with HD x3  , GI igned off   4. Metabolic bone disease -vit d on hd , binder when pos Phos 7.3 ( chronic ^^ as OP ) refused binders  5. COPD - inhalers meds per admit , has home O2  6. HO SDH -MR brain 4/8 shows fading of punctate right temporal lobe infarct, advanced chronic small vessel  Ernest Haber, PA-C [facility] Beeper [phone] 05/07/2019,2:04 PM  LOS: 3 days   Labs: Basic Metabolic Panel: Recent Labs  Lab 05/05/19 0727 05/06/19 0312 05/07/19 0411  NA 136 136 137  K 3.6 3.8 3.7  CL 93* 93*  97*  CO2 25 25 24   GLUCOSE 82 91 115*  BUN 15 24* 22  CREATININE 3.87* 5.17* 5.17*  CALCIUM 9.6 9.6 9.9  PHOS 7.3*  --   --    Liver Function Tests: Recent Labs  Lab 05/04/19 1621 05/05/19 0727 05/06/19 0312  AST 17 14* 13*  ALT 11 13 11   ALKPHOS 104 77 84  BILITOT 1.3* 1.1 1.5*  PROT 6.6 5.6* 5.8*  ALBUMIN 3.1* 2.7* 2.8*   No results for input(s): LIPASE, AMYLASE in the last 168 hours. Recent Labs  Lab 05/06/19 0312  AMMONIA 31   CBC: Recent Labs  Lab 05/04/19 1621 05/04/19 1630 05/05/19 1008 05/06/19 0312 05/07/19 0411  WBC 20.7*   < > 8.0 9.3 9.1  NEUTROABS 15.9*   < > 5.2 6.1 5.9  HGB 4.7*   < > 12.6 13.2 13.8  HCT 15.9*   < > 40.9 42.5 44.5  MCV 101.3*  --  92.3 91.4 94.3  PLT 307   < > 153 159 148*   < > = values in this interval not displayed.   Cardiac Enzymes: No results for input(s): CKTOTAL, CKMB, CKMBINDEX, TROPONINI in the last 168 hours.  CBG: Recent Labs  Lab 05/06/19 0630 05/06/19 1228 05/06/19 1624 05/06/19 2116 05/07/19 1139  GLUCAP 90 74 143* 124* 110*    Studies/Results: No results found. Medications: . sodium chloride Stopped (05/04/19 1810)  . ferric gluconate (FERRLECIT/NULECIT) IV Stopped (05/06/19 1930)   . antiseptic oral rinse  15 mL Mouth Rinse BID  . Chlorhexidine Gluconate Cloth  6 each Topical Daily  . insulin aspart  0-5 Units Subcutaneous QHS  . insulin aspart  0-6 Units Subcutaneous TID WC  . levothyroxine  112 mcg Oral QAC breakfast  . montelukast  10 mg Oral QHS

## 2019-05-07 NOTE — Progress Notes (Signed)
   05/07/19 1720  Clinical Encounter Type  Visited With Patient  Visit Type Initial  Referral From Palliative care team   Chaplain responded to a palliative care consult. Chaplain met with the patient, who indicated that she was doing fine and had no needs at this point. Patient seems pleased to be leaving the hospital soon. Chaplain introduced spiritual care services. Chaplain extended hospitality. Spiritual care services available as needed.   Jeri Lager, Chaplain

## 2019-05-07 NOTE — TOC Initial Note (Addendum)
Transition of Care [facility]) - Initial/Assessment Note    Patient Details  Name: Joan Mosley MRN: [id number] Date of Birth: 1945-01-29  [facility]) CM/SW Contact:    Bary Castilla, LCSW Phone Number: [phone] 05/07/2019, 11:32 AM  Clinical Narrative:                   CSW was alerted that patient was medically stable for discharge. CSW spoke with [name] at facilty and she stated that patient would need updated COVID and could return tomorrow. CSW alerted MD.  CSW spoke with patient's daughter [name] to confirm the discharge plan to return to [place]. [name] confirmed that was the plan.  [name] request that COVID results be sent faxed to [phone].  TOC team will continue to assist with discharge planning needs.  Expected Discharge Plan: [facility]     Patient Goals and CMS Choice        Expected Discharge Plan and Services Expected Discharge Plan: [place] arrangements for the past 2 months: [place]                                      Prior Living Arrangements/Services Living arrangements for the past 2 months: [place] Lives with:: Facility Resident                   Activities of Daily Living      Permission Sought/Granted      Share Information with NAME: dana           Emotional Assessment              Admission diagnosis:  Symptomatic anemia [D64.9] Patient Active Problem List   Diagnosis Date Noted  . Symptomatic anemia 05/04/2019  . Neurocognitive deficits 04/27/2019  . Adult failure to thrive   . Acute encephalopathy 04/20/2019  . Stroke ([place]) 04/20/2019  . Dysphagia 03/29/2019  . Aspiration into airway   . Pneumonia due to COVID-19 virus 02/09/2019  . Hypotension 12/24/2018  . Aspiration pneumonia of right lower lobe ([place])   . COPD (chronic obstructive pulmonary disease) ([place]) 11/11/2018  . TIA (transient ischemic attack)   .  HLD (hyperlipidemia)   . Type II diabetes mellitus with renal manifestations ([place])   . Depression with anxiety   . Advanced care planning/counseling discussion   . Goals of care, counseling/discussion   . Palliative care by specialist   . Pulmonary edema 09/08/2018  . Respiratory failure ([place]) 09/08/2018  . Weakness   . Chronic respiratory failure with hypoxia, on home O2 therapy ([place]) 09/06/2018  . Subdural hematoma ([place]) 08/22/2018  . Elevated troponin 08/22/2018  . ESRD on dialysis ([place]) 08/22/2018  . Anxiety 07/04/2018  . Counseling regarding advanced directives and goals of care 07/01/2018  . Full code status 07/01/2018  . Drug-induced constipation 07/01/2018  . Hemodialysis-associated hypotension 07/01/2018  . Dialysis patient ([place]) 07/01/2018  . HOH (hard of hearing) 07/01/2018  . Stage 5 chronic kidney disease on chronic dialysis ([place]) 07/01/2018  . Type 2 diabetes mellitus with diabetic polyneuropathy, with long-term current use of insulin ([place]) 07/01/2018  . Hypothyroidism 07/01/2018  . Gait instability 07/01/2018  . Hyperkalemia 04/20/2018  . Cerebral infarction, unspecified ([place]) 04/05/2018  . Chronic diastolic (congestive) heart failure ([place]) 04/05/2018  . Diarrhea, unspecified 04/05/2018  . Pain,  unspecified 04/05/2018  . Pruritus, unspecified 04/05/2018  . Shortness of breath 04/05/2018  . Coagulation defect, unspecified ([place]) 03/31/2018  . Anemia in chronic kidney disease 03/25/2018  . Iron deficiency anemia, unspecified 03/25/2018  . Secondary hyperparathyroidism of renal origin ([place]) 03/25/2018   PCP:  Flossie Buffy, NP Pharmacy:   [facility] ([facility]), [place] [address] [place] ([place] [address] Phone: [phone] Fax: [phone]     Social Determinants of Health (SDOH) Interventions    Readmission Risk Interventions Readmission Risk Prevention Plan 10/04/2018  Transportation Screening Complete   Medication Review ([place]) Complete  PCP or Specialist appointment within 3-5 days of discharge Complete  HRI or [place] Complete  SW Recovery Care/Counseling Consult Complete  Palliative Care Screening Complete  [place] Patient Refused

## 2019-05-08 DIAGNOSIS — N186 End stage renal disease: Secondary | ICD-10-CM | POA: Diagnosis not present

## 2019-05-08 DIAGNOSIS — D649 Anemia, unspecified: Secondary | ICD-10-CM | POA: Diagnosis not present

## 2019-05-08 DIAGNOSIS — I5032 Chronic diastolic (congestive) heart failure: Secondary | ICD-10-CM | POA: Diagnosis not present

## 2019-05-08 DIAGNOSIS — J449 Chronic obstructive pulmonary disease, unspecified: Secondary | ICD-10-CM | POA: Diagnosis not present

## 2019-05-08 LAB — CBC WITH DIFFERENTIAL/PLATELET
Abs Immature Granulocytes: 0.08 10*3/uL — ABNORMAL HIGH (ref 0.00–0.07)
Basophils Absolute: 0.1 10*3/uL (ref 0.0–0.1)
Basophils Relative: 1 %
Eosinophils Absolute: 0.2 10*3/uL (ref 0.0–0.5)
Eosinophils Relative: 2 %
HCT: 41.9 % (ref 36.0–46.0)
Hemoglobin: 13 g/dL (ref 12.0–15.0)
Immature Granulocytes: 1 %
Lymphocytes Relative: 24 %
Lymphs Abs: 2.4 10*3/uL (ref 0.7–4.0)
MCH: 29.1 pg (ref 26.0–34.0)
MCHC: 31 g/dL (ref 30.0–36.0)
MCV: 93.9 fL (ref 80.0–100.0)
Monocytes Absolute: 0.9 10*3/uL (ref 0.1–1.0)
Monocytes Relative: 9 %
Neutro Abs: 6.1 10*3/uL (ref 1.7–7.7)
Neutrophils Relative %: 63 %
Platelets: 165 10*3/uL (ref 150–400)
RBC: 4.46 MIL/uL (ref 3.87–5.11)
RDW: 18.1 % — ABNORMAL HIGH (ref 11.5–15.5)
WBC: 9.7 10*3/uL (ref 4.0–10.5)
nRBC: 0 % (ref 0.0–0.2)

## 2019-05-08 LAB — BASIC METABOLIC PANEL
Anion gap: 18 — ABNORMAL HIGH (ref 5–15)
BUN: 38 mg/dL — ABNORMAL HIGH (ref 8–23)
CO2: 23 mmol/L (ref 22–32)
Calcium: 9.6 mg/dL (ref 8.9–10.3)
Chloride: 96 mmol/L — ABNORMAL LOW (ref 98–111)
Creatinine, Ser: 6.95 mg/dL — ABNORMAL HIGH (ref 0.44–1.00)
GFR calc Af Amer: 6 mL/min — ABNORMAL LOW (ref >60)
GFR calc non Af Amer: 5 mL/min — ABNORMAL LOW (ref >60)
Glucose, Bld: 110 mg/dL — ABNORMAL HIGH (ref 70–99)
Potassium: 4.1 mmol/L (ref 3.5–5.1)
Sodium: 137 mmol/L (ref 135–145)

## 2019-05-08 LAB — GLUCOSE, CAPILLARY
Glucose-Capillary: 102 mg/dL — ABNORMAL HIGH (ref 70–99)
Glucose-Capillary: 99 mg/dL (ref 70–99)

## 2019-05-08 MED ORDER — SEVELAMER CARBONATE 800 MG PO TABS: 800.0000 mg | ORAL_TABLET | Freq: Every day | ORAL | Status: DC

## 2019-05-08 NOTE — TOC Transition Note (Signed)
Transition of Care [facility]) - CM/SW Discharge Note   Patient Details  Name: Joan Mosley MRN: [id number] Date of Birth: 1945/04/19  [facility]) CM/SW Contact:  Vinie Sill, [place] Phone Number: 05/08/2019, 3:25 PM   Clinical Narrative:     Patient will DC to: [facility]  DC Date: 05/08/19 Family Notified: [name], daughter  Transport CB:SWHQ   RN, patient, and facility notified of DC. Discharge Summary sent to facility. RN given number for [phone], Room 317. Ambulance transport requested for patient.   Clinical Social Worker signing off.  [name], MSW, [place] Clinical Social Worker    Final next level of care: Skilled Nursing Facility Barriers to Discharge: Barriers Resolved   Patient Goals and CMS Choice        Discharge Placement              Patient chooses bed at: [facility] Patient to be transferred to facility by: [place] Name of family member notified: [name], daughter Patient and family notified of of transfer: 05/08/19  Discharge Plan and Services                                     Social Determinants of Health (SDOH) Interventions     Readmission Risk Interventions Readmission Risk Prevention Plan 10/04/2018  Transportation Screening Complete  Medication Review Press photographer) Complete  PCP or Specialist appointment within 3-5 days of discharge Complete  HRI or [place] Complete  SW Recovery Care/Counseling Consult Complete  [place] Patient Refused

## 2019-05-08 NOTE — TOC Progression Note (Signed)
Transition of Care [facility]) - Progression Note    Patient Details  Name: Joan Mosley MRN: [id number] Date of Birth: 10-21-1945  Transition of Care [facility]) CM/SW [place], [place] Phone Number: 05/08/2019, 10:26 AM  Clinical Narrative:     CSW called [facility] Admission Coordinator/SNF- left voice message to contact CSW. CSW waiting on response.  CSW faxed covid results as requested to SNF.   [name], MSW, [place] Clinical Social Worker    Expected Discharge Plan: [place]    Expected Discharge Plan and Services Expected Discharge Plan: [place] arrangements for the past 2 months: [place]                                       Social Determinants of Health (SDOH) Interventions    Readmission Risk Interventions Readmission Risk Prevention Plan 10/04/2018  Transportation Screening Complete  Medication Review Press photographer) Complete  PCP or Specialist appointment within 3-5 days of discharge Complete  HRI or [place] Complete  SW Recovery Care/Counseling Consult Complete  [place] Patient Refused

## 2019-05-08 NOTE — Progress Notes (Signed)
Patient ID: Joan Mosley, female   DOB: 1945/06/03, 74 y.o.   MRN: [id number]  PROGRESS NOTE    Zavannah Deblois  XBD:[id number] DOB: February 08, 1945 DOA: 05/04/2019 PCP: Flossie Buffy, NP   Brief Narrative:  74 year old female with history of end-stage renal disease on hemodialysis, diabetes mellitus type 2, chronic hypoxic respiratory failure on oxygen, hypothyroidism, recent COVID-19 infection about 2 months ago, diabetic neuropathy presented with generalized weakness on 05/04/2019 from dialysis unit; dialysis could not be done because of weakness and delirium.  In the ED, she was found to have hemoglobin of 4.7 with no active bleeding or any reported bleeding episodes; stool guaiac was negative.  Nephrology was consulted.  Assessment & Plan:  Acute on chronic anemia of chronic disease -Presented with hemoglobin of 4.7.  Prior hemoglobin was 10.1 on 04/22/2019.  No overt signs of bleeding.  Stool for guaiac was negative. -Status post 3 units packed red cells transfusion on admission.   -Hemoglobin 13 today. -GI signed off on 05/06/2019  End-stage renal disease on hemodialysis -Nephrology consulted.  Dialysis as per nephrology schedule.    Diabetes mellitus type 2 -Monitor CBGs with SSI  Leukocytosis -Questionable cause.  Resolved  Chronic diastolic heart failure -Currently compensated.  Volume managed by dialysis.  Hypothyroidism -Continue Synthroid  Chronic hypoxic respiratory failure COPD -Currently at baseline.  Continue oxygen supplementation. -Patient is supposed to be on BiPAP at night and as needed during daytime for drowsiness.  Currently refusing BiPAP at night.  Generalized deconditioning -Overall prognosis is guarded to poor.  Patient used to be a DNR in the past but CODE STATUS was changed to full code few months ago -Palliative care following.  Still remains full code. -Education officer, museum following for discharge planning back to SNF.  DVT prophylaxis: SCDs.  Avoid  heparin because of anemia Code Status: Full Family Communication: Spoke to son/Ryan on phone on 05/05/2019 Disposition Plan: Possible discharge back to SNF once bed is available.  Currently medically stable for discharge.   Consultants: GI/nephrology/palliative care  Procedures: None  Antimicrobials: None   Subjective: Patient seen and examined at bedside.  Poor historian.  Sleepy, wakes up only very slightly, hardly answers any questions.  No overnight fever, black or bloody stools reported by nursing staff.   Objective: Vitals:   05/07/19 2035 05/08/19 0453 05/08/19 0805 05/08/19 0900  BP: 121/76 127/79 114/71 133/84  Pulse: (!) 104 89 94 94  Resp: 20 18 18    Temp: 98.5 F (36.9 C) 98.2 F (36.8 C) 98.3 F (36.8 C) 98.4 F (36.9 C)  TempSrc: Axillary Oral Oral Oral  SpO2: 91% 93% 93% 93%  Weight:  86.5 kg    Height:        Intake/Output Summary (Last 24 hours) at 05/08/2019 1047 Last data filed at 05/07/2019 2100 Gross per 24 hour  Intake 240 ml  Output --  Net 240 ml   Filed Weights   05/06/19 0500 05/07/19 0438 05/08/19 0453  Weight: 83.7 kg 84 kg 86.5 kg    Examination:  General exam: No acute distress.  Elderly female lying in bed.  Very poor historian.  Sleepy, wakes up only very slightly, hardly answers any questions respiratory system: Bilateral decreased breath sounds at bases with some crackles.  No wheezing  cardiovascular system: Rate controlled, S1-S2 heard Gastrointestinal system: Abdomen is nondistended, soft and nontender.  Normal bowel sounds heard  extremities: Bilateral mild lower extremity edema present; no cyanosis  Data Reviewed: I have personally reviewed following labs  and imaging studies  CBC: Recent Labs  Lab 05/04/19 1621 05/04/19 1621 05/04/19 1630 05/05/19 1008 05/06/19 0312 05/07/19 0411 05/08/19 0414  WBC 20.7*  --   --  8.0 9.3 9.1 9.7  NEUTROABS 15.9*  --   --  5.2 6.1 5.9 6.1  HGB 4.7*   < > 5.1* 12.6 13.2 13.8 13.0  HCT  15.9*   < > 15.0* 40.9 42.5 44.5 41.9  MCV 101.3*  --   --  92.3 91.4 94.3 93.9  PLT 307  --   --  153 159 148* 165   < > = values in this interval not displayed.   Basic Metabolic Panel: Recent Labs  Lab 05/04/19 1621 05/04/19 1621 05/04/19 1630 05/05/19 0727 05/06/19 0312 05/07/19 0411 05/08/19 0414  NA 137   < > 131* 136 136 137 137  K 3.5   < > 3.3* 3.6 3.8 3.7 4.1  CL 93*   < > 93* 93* 93* 97* 96*  CO2 26  --   --  25 25 24 23   GLUCOSE 113*   < > 113* 82 91 115* 110*  BUN 11   < > 12 15 24* 22 38*  CREATININE 3.17*   < > 3.20* 3.87* 5.17* 5.17* 6.95*  CALCIUM 8.9  --   --  9.6 9.6 9.9 9.6  PHOS  --   --   --  7.3*  --   --   --    < > = values in this interval not displayed.   GFR: Estimated Creatinine Clearance: 7 mL/min (A) (by C-G formula based on SCr of 6.95 mg/dL (H)). Liver Function Tests: Recent Labs  Lab 05/04/19 1621 05/05/19 0727 05/06/19 0312  AST 17 14* 13*  ALT 11 13 11   ALKPHOS 104 77 84  BILITOT 1.3* 1.1 1.5*  PROT 6.6 5.6* 5.8*  ALBUMIN 3.1* 2.7* 2.8*   No results for input(s): LIPASE, AMYLASE in the last 168 hours. Recent Labs  Lab 05/06/19 0312  AMMONIA 31   Coagulation Profile: Recent Labs  Lab 05/04/19 1621  INR 1.0   Cardiac Enzymes: No results for input(s): CKTOTAL, CKMB, CKMBINDEX, TROPONINI in the last 168 hours. BNP (last 3 results) Recent Labs    04/10/19 1418  PROBNP 81.0   HbA1C: No results for input(s): HGBA1C in the last 72 hours. CBG: Recent Labs  Lab 05/07/19 0638 05/07/19 1139 05/07/19 1633 05/07/19 2116 05/08/19 0621  GLUCAP 111* 110* 126* 141* 102*   Lipid Profile: No results for input(s): CHOL, HDL, LDLCALC, TRIG, CHOLHDL, LDLDIRECT in the last 72 hours. Thyroid Function Tests: Recent Labs    05/06/19 0312  TSH 6.920*   Anemia Panel: Recent Labs    05/06/19 0312  VITAMINB12 439  FOLATE 10.8   Sepsis Labs: No results for input(s): PROCALCITON, LATICACIDVEN in the last 168 hours.  Recent  Results (from the past 240 hour(s))  SARS CORONAVIRUS 2 (TAT 6-24 HRS) Nasopharyngeal Nasopharyngeal Swab     Status: None   Collection Time: 05/04/19 10:28 PM   Specimen: Nasopharyngeal Swab  Result Value Ref Range Status   SARS Coronavirus 2 NEGATIVE NEGATIVE Final    Comment: (NOTE) SARS-CoV-2 target nucleic acids are NOT DETECTED. The SARS-CoV-2 RNA is generally detectable in upper and lower respiratory specimens during the acute phase of infection. Negative results do not preclude SARS-CoV-2 infection, do not rule out co-infections with other pathogens, and should not be used as the sole basis for treatment or other  patient management decisions. Negative results must be combined with clinical observations, patient history, and epidemiological information. The expected result is Negative. Fact Sheet for Patients: SugarRoll.be Fact Sheet for Healthcare Providers: https://www.woods-mathews.com/ This test is not yet approved or cleared by the [place] FDA and  has been authorized for detection and/or diagnosis of SARS-CoV-2 by FDA under an Emergency Use Authorization (EUA). This EUA will remain  in effect (meaning this test can be used) for the duration of the COVID-19 declaration under Section 56 4(b)(1) of the Act, 21 U.S.C. section 360bbb-3(b)(1), unless the authorization is terminated or revoked sooner. Performed at [facility], [place] [address] [place], [place] [address]   SARS CORONAVIRUS 2 (TAT 6-24 HRS) Nasopharyngeal Nasopharyngeal Swab     Status: None   Collection Time: 05/07/19  4:38 PM   Specimen: Nasopharyngeal Swab  Result Value Ref Range Status   SARS Coronavirus 2 NEGATIVE NEGATIVE Final    Comment: (NOTE) SARS-CoV-2 target nucleic acids are NOT DETECTED. The SARS-CoV-2 RNA is generally detectable in upper and lower respiratory specimens during the acute phase of infection. Negative results do not preclude  SARS-CoV-2 infection, do not rule out co-infections with other pathogens, and should not be used as the sole basis for treatment or other patient management decisions. Negative results must be combined with clinical observations, patient history, and epidemiological information. The expected result is Negative. Fact Sheet for Patients: SugarRoll.be Fact Sheet for Healthcare Providers: https://www.woods-mathews.com/ This test is not yet approved or cleared by the [place] FDA and  has been authorized for detection and/or diagnosis of SARS-CoV-2 by FDA under an Emergency Use Authorization (EUA). This EUA will remain  in effect (meaning this test can be used) for the duration of the COVID-19 declaration under Section 56 4(b)(1) of the Act, 21 U.S.C. section 360bbb-3(b)(1), unless the authorization is terminated or revoked sooner. Performed at [facility], [place] [address] [place], [place] [address]          Radiology Studies: No results found.      Scheduled Meds: . antiseptic oral rinse  15 mL Mouth Rinse BID  . Chlorhexidine Gluconate Cloth  6 each Topical Daily  . insulin aspart  0-5 Units Subcutaneous QHS  . insulin aspart  0-6 Units Subcutaneous TID WC  . levothyroxine  112 mcg Oral QAC breakfast  . montelukast  10 mg Oral QHS   Continuous Infusions: . sodium chloride Stopped (05/04/19 1810)  . ferric gluconate (FERRLECIT/NULECIT) IV Stopped (05/06/19 1930)          Aline August, MD [facility] 05/08/2019, 10:47 AM

## 2019-05-08 NOTE — Progress Notes (Signed)
Patient ID: Joan Mosley, female   DOB: May 05, 1945, 74 y.o.   MRN: [id number] S: Pt somnolent this am but arousable O:BP 133/84 (BP Location: Left Arm)   Pulse 94   Temp 98.4 F (36.9 C) (Oral)   Resp 18   Ht 5\' 1"  (1.549 m)   Wt 86.5 kg   SpO2 93%   BMI 36.03 kg/m   Intake/Output Summary (Last 24 hours) at 05/08/2019 1013 Last data filed at 05/07/2019 2100 Gross per 24 hour  Intake 240 ml  Output --  Net 240 ml   Intake/Output: I/O last 3 completed shifts: In: 345 [P.O.:240; IV Piggyback:105] Out: -   Intake/Output this shift:  No intake/output data recorded. Weight change: 2.5 kg Gen: frail, elderly, chronically ill-appearing WF CVS: no rub Resp: cta Abd: benign Ext: trace pretibial edema  Recent Labs  Lab 05/04/19 1621 05/04/19 1630 05/05/19 0727 05/06/19 0312 05/07/19 0411 05/08/19 0414  NA 137 131* 136 136 137 137  K 3.5 3.3* 3.6 3.8 3.7 4.1  CL 93* 93* 93* 93* 97* 96*  CO2 26  --  25 25 24 23   GLUCOSE 113* 113* 82 91 115* 110*  BUN 11 12 15  24* 22 38*  CREATININE 3.17* 3.20* 3.87* 5.17* 5.17* 6.95*  ALBUMIN 3.1*  --  2.7* 2.8*  --   --   CALCIUM 8.9  --  9.6 9.6 9.9 9.6  PHOS  --   --  7.3*  --   --   --   AST 17  --  14* 13*  --   --   ALT 11  --  13 11  --   --    Liver Function Tests: Recent Labs  Lab 05/04/19 1621 05/05/19 0727 05/06/19 0312  AST 17 14* 13*  ALT 11 13 11   ALKPHOS 104 77 84  BILITOT 1.3* 1.1 1.5*  PROT 6.6 5.6* 5.8*  ALBUMIN 3.1* 2.7* 2.8*   No results for input(s): LIPASE, AMYLASE in the last 168 hours. Recent Labs  Lab 05/06/19 0312  AMMONIA 31   CBC: Recent Labs  Lab 05/04/19 1621 05/04/19 1630 05/05/19 1008 05/05/19 1008 05/06/19 0312 05/07/19 0411 05/08/19 0414  WBC 20.7*   < > 8.0   < > 9.3 9.1 9.7  NEUTROABS 15.9*   < > 5.2   < > 6.1 5.9 6.1  HGB 4.7*   < > 12.6   < > 13.2 13.8 13.0  HCT 15.9*   < > 40.9   < > 42.5 44.5 41.9  MCV 101.3*  --  92.3  --  91.4 94.3 93.9  PLT 307   < > 153   < > 159 148*  165   < > = values in this interval not displayed.   Cardiac Enzymes: No results for input(s): CKTOTAL, CKMB, CKMBINDEX, TROPONINI in the last 168 hours. CBG: Recent Labs  Lab 05/07/19 0638 05/07/19 1139 05/07/19 1633 05/07/19 2116 05/08/19 0621  GLUCAP 111* 110* 126* 141* 102*    Iron Studies: No results for input(s): IRON, TIBC, TRANSFERRIN, FERRITIN in the last 72 hours. Studies/Results: No results found. Marland Kitchen antiseptic oral rinse  15 mL Mouth Rinse BID  . Chlorhexidine Gluconate Cloth  6 each Topical Daily  . insulin aspart  0-5 Units Subcutaneous QHS  . insulin aspart  0-6 Units Subcutaneous TID WC  . levothyroxine  112 mcg Oral QAC breakfast  . montelukast  10 mg Oral QHS    BMET    Component  Value Date/Time   NA 137 05/08/2019 0414   K 4.1 05/08/2019 0414   CL 96 (L) 05/08/2019 0414   CO2 23 05/08/2019 0414   GLUCOSE 110 (H) 05/08/2019 0414   BUN 38 (H) 05/08/2019 0414   CREATININE 6.95 (H) 05/08/2019 0414   CREATININE 7.20 (H) 07/01/2018 1149   CALCIUM 9.6 05/08/2019 0414   CALCIUM 9.7 02/24/2019 1008   GFRNONAA 5 (L) 05/08/2019 0414   GFRAA 6 (L) 05/08/2019 0414   CBC    Component Value Date/Time   WBC 9.7 05/08/2019 0414   RBC 4.46 05/08/2019 0414   HGB 13.0 05/08/2019 0414   HCT 41.9 05/08/2019 0414   PLT 165 05/08/2019 0414   MCV 93.9 05/08/2019 0414   MCH 29.1 05/08/2019 0414   MCHC 31.0 05/08/2019 0414   RDW 18.1 (H) 05/08/2019 0414   LYMPHSABS 2.4 05/08/2019 0414   MONOABS 0.9 05/08/2019 0414   EOSABS 0.2 05/08/2019 0414   BASOSABS 0.1 05/08/2019 0414    D[facility] Orders: [facility] EDW[facility] kg (closest Wt 83 kg past several txs)HD Bath 2k, 2caTime 4Heparin NONE. AccessR IJ Perm cath  Hec 9mcg IV/HD Mircera 100 (last on 04/13/19 With HGB 11.6 (04/27/19 ?? ) TFS 22% 03/1819 (no venofer )   CXR = ?atypical pNA  Problem/Plan:  1. ESRD -HD on TTS stable and has been on schedule.  Labs stable.  no hep 2/2 HO  SDH CVA stable for next OP HD Tues  2. Hypertension/volume - some chronic LEE ,BEd bound ,nonambulatory ?dependent , no excess vol on cxr 3. Anemia - 4.7 = 3 u prbcs given andthenhgb 12.6 ,>13 .2>13.8  this am ??lab error at admit /monitor trend no esa , needs venofer with HDx3, GI igned off   4. Metabolic bone disease -vit d on hd , binder when pos Phos 7.3 ( chronic ^^ as OP )refused binders 5. COPD - inhalers meds per admit , has home O2  6. HO SDH -MR brain 4/8 shows fading of punctate right temporal lobe infarct, advanced chronic small vessel 7. Disposition- for discharge and will follow up with HD tomorrow as an outpatient.   Donetta Potts, MD [employer] [phone]

## 2019-05-08 NOTE — Discharge Summary (Signed)
Physician Discharge Summary  Joan Mosley ALP:[id number] DOB: 01/28/1945 DOA: 05/04/2019  PCP: Flossie Buffy, NP  Admit date: 05/04/2019 Discharge date: 05/08/2019  Admitted From: SNF Disposition: SNF  Recommendations for Outpatient Follow-up:  1. Follow up with SNF provider at earliest convenience with repeat CBC/BMP in the next 2 days 2. Outpatient follow-up with dialysis as scheduled 3. Patient will benefit from outpatient follow-up by palliative care team 4. If patient has symptoms of overt GI bleeding, outpatient follow-up with GI. 5. Follow up in ED if symptoms worsen or new appear   Home Health: No Equipment/Devices: None  Discharge Condition: Guarded to poor CODE STATUS: Full Diet recommendation: Heart healthy/carb modified/renal hemodialysis diet  Brief/Interim Summary: 74 year old female with history of end-stage renal disease on hemodialysis, diabetes mellitus type 2, chronic hypoxic respiratory failure on oxygen, hypothyroidism, recent COVID-19 infection about 2 months ago, diabetic neuropathy presented with generalized weakness on 05/04/2019 from dialysis unit; dialysis could not be done because of weakness and delirium.  In the ED, she was found to have hemoglobin of 4.7 with no active bleeding or any reported bleeding episodes; stool guaiac was negative.  Nephrology was consulted.  During the hospitalization, she was transfused 3 units of packed red cells and subsequently hemoglobin remained stable.  There was no obvious overt GI bleeding.  GI was consulted and since no event remained stable after transfusion, they signed off.  PT recommended discharge back to SNF.  Palliative care was also consulted for goals of care discussion.  Patient remains full code.  She will be discharged to SNF once bed is available.   Discharge Diagnoses:  Acute on chronic anemia of chronic disease -Presented with hemoglobin of 4.7.  Prior hemoglobin was 10.1 on 04/22/2019.  No overt signs of  bleeding.  Stool for guaiac was negative. -Status post 3 units packed red cells transfusion on admission.   -Hemoglobin 13 today. -GI signed off on 05/06/2019 -Outpatient follow-up of CBC.  End-stage renal disease on hemodialysis -Nephrology consulted.  Dialysis as per nephrology schedule.    Continue outpatient dialysis.  Diabetes mellitus type 2 -Carb modified diet.  Continue prior regimen.  Leukocytosis -Questionable cause.  Resolved  Chronic diastolic heart failure -Currently compensated.  Volume managed by dialysis.  Hypothyroidism -Continue Synthroid  Chronic hypoxic respiratory failure COPD -Currently at baseline.  Continue oxygen supplementation. -Patient is supposed to be on BiPAP at night and as needed during daytime for drowsiness.  BiPAP needs to be continued at SNF as well. Currently refusing BiPAP at night.  Generalized deconditioning -Overall prognosis is guarded to poor.  Patient used to be a DNR in the past but CODE STATUS was changed to full code few months ago -Palliative care following.  Still remains full code. -Recommend outpatient follow-up with palliative care and continued goals of care discussion  Discharge Instructions   Allergies as of 05/08/2019      Reactions   Avelox [moxifloxacin Hcl In Nacl] Other (See Comments)   Unknown reaction   Codeine Anaphylaxis   Penicillins Rash   Did it involve swelling of the face/tongue/throat, SOB, or low BP? Unk Did it involve sudden or severe rash/hives, skin peeling, or any reaction on the inside of your mouth or nose? Yes Did you need to seek medical attention at a hospital or doctor's office? Unk When did it last happen? Unk If all above answers are "NO", may proceed with cephalosporin use.   Sulfa Antibiotics Other (See Comments)   Unknown reaction   Dextromethorphan-guaifenesin  Other (See Comments)   Reported by [facility] - unknown reaction. Pt states she is not allergic to  dextromethorphan-guaifenesin.   Shellfish Allergy Hives, Itching, Rash      Medication List    STOP taking these medications   aspirin 81 MG EC tablet   clonazePAM 0.5 MG tablet Commonly known as: KLONOPIN   furosemide 80 MG tablet Commonly known as: LASIX     TAKE these medications   acetaminophen 325 MG tablet Commonly known as: TYLENOL Take 650 mg by mouth every 6 (six) hours as needed for mild pain (for arthritic pain).   ascorbic acid 500 MG tablet Commonly known as: VITAMIN C Take 1 tablet (500 mg total) by mouth daily.   benzonatate 200 MG capsule Commonly known as: TESSALON Take 1 capsule (200 mg total) by mouth 2 (two) times daily as needed for cough.   Darbepoetin Alfa 25 MCG/0.42ML Sosy injection Commonly known as: ARANESP Inject 0.42 mLs (25 mcg total) into the vein every Tuesday with hemodialysis.   docusate sodium 100 MG capsule Commonly known as: COLACE Take 100 mg by mouth every morning.   doxercalciferol 4 MCG/2ML injection Commonly known as: HECTOROL Inject 1.5 mLs (3 mcg total) into the vein Every Tuesday,Thursday,and Saturday with dialysis.   feeding supplement (NEPRO CARB STEADY) Liqd Take 237 mLs by mouth 2 (two) times daily between meals.   FLUoxetine 10 MG tablet Commonly known as: PROZAC Take 1 tablet (10 mg total) by mouth daily.   guaiFENesin 600 MG 12 hr tablet Commonly known as: MUCINEX Take 1 tablet (600 mg total) by mouth 2 (two) times daily.   ipratropium-albuterol 0.5-2.5 (3) MG/3ML Soln Commonly known as: DUONEB Take 3 mLs by nebulization See admin instructions. Nebulize and inhale 3 ml's into the lungs every four hours, while awake   K-Y LUBRICANT JELLY SENSITIVE EX Place 1 application into both nostrils as needed (as directed- for lubrication).   levalbuterol 45 MCG/ACT inhaler Commonly known as: XOPENEX HFA Inhale 2 puffs into the lungs every 4 (four) hours as needed for wheezing.   levothyroxine 112 MCG  tablet Commonly known as: SYNTHROID Take 1 tablet (112 mcg total) by mouth daily before breakfast. 1 AM What changed: additional instructions   lidocaine 5 % Commonly known as: LIDODERM Place 1 patch onto the skin daily as needed (pain). Remove & Discard patch within 12 hours or as directed by MD   midodrine 10 MG tablet Commonly known as: PROAMATINE Take 10 mg by mouth See admin instructions. Take 10 mg by mouth on Tuesday, Thursday, Saturday 45 minutes before dialysis   montelukast 10 MG tablet Commonly known as: SINGULAIR Take 10 mg by mouth at bedtime.   multivitamin Tabs tablet Take 1 tablet by mouth at bedtime.   nystatin powder Commonly known as: MYCOSTATIN/NYSTOP Apply 1 g topically 2 (two) times daily as needed (as directed- to any rashes). What changed: Another medication with the same name was removed. Continue taking this medication, and follow the directions you see here.   oxymetazoline 0.05 % nasal spray Commonly known as: AFRIN Place 1 spray into both nostrils 2 (two) times daily as needed for congestion.   senna 8.6 MG Tabs tablet Commonly known as: SENOKOT Take 1 tablet by mouth every morning.   sevelamer carbonate 800 MG tablet Commonly known as: RENVELA Take 1 tablet (800 mg total) by mouth daily. With a snack   SIMPLY SALINE NA Place 1-2 sprays into the nose as needed (congestion).   Ventolin  HFA 108 (90 Base) MCG/ACT inhaler Generic drug: albuterol Inhale 2 puffs into the lungs every 6 (six) hours as needed for wheezing or shortness of breath.        Allergies  Allergen Reactions  . Avelox [Moxifloxacin Hcl In Nacl] Other (See Comments)    Unknown reaction  . Codeine Anaphylaxis  . Penicillins Rash    Did it involve swelling of the face/tongue/throat, SOB, or low BP? Unk Did it involve sudden or severe rash/hives, skin peeling, or any reaction on the inside of your mouth or nose? Yes Did you need to seek medical attention at a hospital or  doctor's office? Unk When did it last happen? Unk If all above answers are "NO", may proceed with cephalosporin use.   . Sulfa Antibiotics Other (See Comments)    Unknown reaction  . Dextromethorphan-Guaifenesin Other (See Comments)    Reported by [facility] - unknown reaction. Pt states she is not allergic to dextromethorphan-guaifenesin.  . Shellfish Allergy Hives, Itching and Rash    Consultations:  GI/nephrology/palliative care   Procedures/Studies: CT Head Wo Contrast  Result Date: 04/19/2019 CLINICAL DATA:  Encephalopathy, confusion, diabetes EXAM: CT HEAD WITHOUT CONTRAST TECHNIQUE: Contiguous axial images were obtained from the base of the skull through the vertex without intravenous contrast. COMPARISON:  04/15/2019 FINDINGS: Brain: Chronic small-vessel ischemic changes throughout the periventricular white matter and central pons. No acute infarct or hemorrhage. Lateral ventricles and midline structures are unremarkable. Stable diffuse cerebral atrophy. No acute extra-axial fluid collections. Stable bilateral subdural hygromas along the convexities. No mass effect. Vascular: No hyperdense vessel or unexpected calcification. Skull: Normal. Negative for fracture or focal lesion. Sinuses/Orbits: No acute finding. Other: None IMPRESSION: 1. Stable exam, no acute process. Electronically Signed   By: Randa Ngo M.D.   On: 04/19/2019 23:25   CT HEAD WO CONTRAST  Result Date: 04/15/2019 CLINICAL DATA:  Mental status change EXAM: CT HEAD WITHOUT CONTRAST TECHNIQUE: Contiguous axial images were obtained from the base of the skull through the vertex without intravenous contrast. COMPARISON:  CT head 12/24/2018, 09/08/2018 FINDINGS: Brain: Stable appearance of prior right thalamic and left insular infarcts. Prominent extra-axial CSF spaces towards the vertex may suggest chronic subdural versus hygroma in this patient history of bilateral subdural hematoma. No evidence of active hemorrhage is  seen acute parenchymal hemorrhage hydrocephalus or mass effect/mass lesion. More symmetric prominence of the ventricles, cisterns and sulci compatible with underlying parenchymal volume loss. Patchy areas of white matter hypoattenuation are most compatible with chronic microvascular angiopathy. Vascular: Atherosclerotic calcification of the carotid siphons. No hyperdense vessel. Skull: No calvarial fracture or suspicious osseous lesion. No scalp swelling or hematoma. Sinuses/Orbits: Paranasal sinuses and mastoid air cells are predominantly clear. Included orbital structures are unremarkable. Other: Edentulous with mild mandibular prognathism. IMPRESSION: 1. No CT evidence for acute intracranial process. 2. Remote right thalamic and left insular infarcts, stable from prior. 3. Prominent extra-axial CSF spaces towards the vertex may suggest chronic subdural versus hygroma in this patient history of bilateral subdural hematoma. 4. Background of parenchymal volume loss and chronic microvascular angiopathy. 5. Intracranial atherosclerosis. Electronically Signed   By: Lovena Le M.D.   On: 04/15/2019 23:17   MR ANGIO NECK WO CONTRAST  Result Date: 04/21/2019 CLINICAL DATA:  Follow-up examination for acute stroke. EXAM: MRA NECK WITHOUT  CONTRAST TECHNIQUE: Multiplanar and multiecho pulse sequences of the neck were obtained without intravenous contrast. Angiographic images of the neck were obtained using MRA technique without and with intravenous contrast. COMPARISON:  Prior MRI from 04/20/2019. FINDINGS: AORTIC ARCH: Examination somewhat technically limited by lack of IV contrast. Aortic arch and origin of the great vessels not assessed on this examination. Partially visualized subclavian arteries are widely patent. RIGHT CAROTID SYSTEM: Right common carotid artery patent from its origin to the bifurcation without stenosis. Mild atheromatous irregularity about the right bifurcation without hemodynamically  significant stenosis. Right ICA widely patent to the skull base without stenosis, occlusion, or obvious dissection. LEFT CAROTID SYSTEM: Visualized left common carotid artery widely patent to the bifurcation. Mild atheromatous irregularity about the left bifurcation without definite flow-limiting stenosis. Left ICA patent distally to the skull base without stenosis, occlusion, or definite dissection. VERTEBRAL ARTERIES: Origin of the left vertebral artery not seen. Visualized vertebral arteries widely patent within the neck with antegrade flow, with no appreciable stenosis or other abnormality. Vertebral arteries are largely codominant. IMPRESSION: 1. Mild atheromatous irregularity about the carotid bifurcations without hemodynamically significant stenosis. Otherwise wide patency of both carotid artery systems within the neck. 2. Wide patency of both vertebral arteries within the neck. Electronically Signed   By: Jeannine Boga M.D.   On: 04/21/2019 01:05   MR BRAIN WO CONTRAST  Result Date: 05/04/2019 CLINICAL DATA:  74 year old female with recent encephalopathy, ataxia. 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 EXAM: MRI HEAD WITHOUT CONTRAST TECHNIQUE: Multiplanar, multiecho pulse sequences of the brain and surrounding structures were obtained without intravenous contrast. COMPARISON:  Brain MRI 04/20/2019. FINDINGS: Brain: The tiny area of posterior right temporal lobe white matter diffusion abnormality has faded since last month. No new restricted diffusion to suggest acute infarction. No midline shift, mass effect, evidence of mass lesion, ventriculomegaly, extra-axial collection or acute intracranial hemorrhage. Cervicomedullary junction and pituitary are within normal limits. Stable gray and white matter signal throughout the brain. Widespread bilateral cerebral white matter and deep gray nuclei T2 and FLAIR hyperintensity. Moderate similar T2 and FLAIR heterogeneity in the  pons. No cortical encephalomalacia or chronic cerebral blood products identified. Cerebellum remains within normal limits. Vascular: Major intracranial vascular flow voids are stable. Skull and upper cervical spine: Negative for age visible cervical spine. Bone marrow signal is stable and within normal limits. Sinuses/Orbits: Stable, negative. Other: Persistent bilateral mastoid effusions. Negative nasopharynx. Other visible internal auditory structures appear grossly normal. Negative scalp and face soft tissues. IMPRESSION: 1. No acute intracranial abnormality. 2. 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 3. Otherwise stable brain with advanced signal changes most compatible with chronic small vessel disease. 4. Persistent mastoid effusions, most commonly postinflammatory. Electronically Signed   By: Genevie Ann M.D.   On: 05/04/2019 17:42   MR BRAIN WO CONTRAST  Result Date: 04/20/2019 CLINICAL DATA:  Encephalopathy EXAM: MRI HEAD WITHOUT CONTRAST TECHNIQUE: Multiplanar, multiecho pulse sequences of the brain and surrounding structures were obtained without intravenous contrast. COMPARISON:  None. FINDINGS: Brain: There a small focus mildly reduced diffusion in the right temporal lobe. No hemorrhage. There is no intracranial mass, mass effect, or edema. Patchy and confluent areas of T2 hyperintensity in the supratentorial and pontine white matter are nonspecific but probably reflect moderate to advanced chronic microvascular ischemic changes. There is bilateral cerebral convexity dural thickening or minimal residual chronic subdural collections. Prominence of the ventricles and sulci reflects mild generalized parenchymal volume loss. There is no hydrocephalus or extra-axial fluid collection. Vascular: Major vessel flow voids at the skull base are preserved. Skull and upper cervical spine: Normal marrow signal is preserved.  Sinuses/Orbits: Paranasal sinuses are aerated. Orbits  are unremarkable. Other: Sella is unremarkable.  Nonspecific mastoid effusions. IMPRESSION: Small acute to subacute right temporal infarct. Moderate to advanced chronic microvascular ischemic changes. Dural thickening versus minimal residual chronic subdural collections. Nonspecific mastoid effusions. Electronically Signed   By: Macy Mis M.D.   On: 04/20/2019 10:06   DG Chest Port 1 View  Result Date: 05/04/2019 CLINICAL DATA:  Altered mental status EXAM: PORTABLE CHEST 1 VIEW COMPARISON:  04/24/2019 FINDINGS: Right-sided central line tip in the proximal right atrium as seen previously. Patient rotated towards the right. Aortic atherosclerosis. Left lung shows resolution of atelectasis previously seen at the base. Widespread tiny reticular nodules remain visible, raising possibility of atypical pneumonia. Chronic volume loss persists at the right base. Fine reticular nodular opacities also visible on this side. IMPRESSION: Chronic volume loss at the right base. Improved aeration at the left base. Tiny reticulo nodular shadows throughout both lungs raising the possibility of atypical pneumonia. Electronically Signed   By: Nelson Chimes M.D.   On: 05/04/2019 16:51   DG CHEST PORT 1 VIEW  Result Date: 04/24/2019 CLINICAL DATA:  Aspiration EXAM: PORTABLE CHEST 1 VIEW COMPARISON:  April 22, 2019 FINDINGS: There is a well-positioned tunneled dialysis catheter on the right. The lung volumes are slightly hyperexpanded. The heart size is stable but enlarged. There are small bilateral pleural effusions with adjacent airspace disease favored to represent atelectasis. There is no pneumothorax. No new large focal airspace opacity concerning for aspiration. IMPRESSION: Essentially stable appearance of the chest without evidence for aspiration. Electronically Signed   By: Constance Holster M.D.   On: 04/24/2019 14:57   DG CHEST PORT 1 VIEW  Result Date: 04/22/2019 CLINICAL DATA:  Cough EXAM: PORTABLE CHEST 1 VIEW  COMPARISON:  04/19/2019 FINDINGS: Single frontal view of the chest demonstrates stable right internal jugular dialysis catheter. Cardiac silhouette is unchanged. Continued ectasia and atherosclerosis of the thoracic aorta. Diffuse interstitial prominence unchanged since prior exam. Decreased right pleural effusion. No pneumothorax. IMPRESSION: 1. Diffuse interstitial prominence consistent with chronic interstitial lung disease. 2. Decreased right pleural effusion. Electronically Signed   By: Randa Ngo M.D.   On: 04/22/2019 15:01   DG Chest Portable 1 View  Result Date: 04/19/2019 CLINICAL DATA:  Altered mental status EXAM: PORTABLE CHEST 1 VIEW COMPARISON:  April 15, 2019 FINDINGS: There is unchanged cardiomegaly, the patient is slightly rotated. Aortic calcifications are seen. A right-sided central venous catheter with the tip just entering the right atrium. Again noted are mildly increased interstitial markings throughout both lungs, not significantly changed since the prior exam. There is also stable right basilar small pleural effusion. No acute osseous abnormality. IMPRESSION: Unchanged mildly increased interstitial markings throughout both lungs which could be due to chronic lung changes or mild interstitial edema. Trace right pleural effusion. Electronically Signed   By: Prudencio Pair M.D.   On: 04/19/2019 22:48   DG Chest Port 1 View  Result Date: 04/15/2019 CLINICAL DATA:  Possible aspiration. EXAM: PORTABLE CHEST 1 VIEW COMPARISON:  March 11, 2019 FINDINGS: The study is limited secondary to patient rotation. There is stable right-sided venous catheter positioning. Mild, chronic appearing increased lung markings are noted. This is seen on the prior study. Mild, stable left basilar atelectasis is present. There is no evidence of a pleural effusion or pneumothorax. The cardiac silhouette is mildly enlarged and unchanged in size. Degenerative changes seen throughout the thoracic spine.  IMPRESSION: Stable cardiomegaly without evidence of active disease. Electronically  Signed   By: Virgina Norfolk M.D.   On: 04/15/2019 23:48   DG Swallowing Func-Speech Pathology  Result Date: 04/25/2019 Objective Swallowing Evaluation: Type of Study: MBS-Modified Barium Swallow Study  Patient Details Name: Joan Mosley MRN: [id number] Date of Birth: [birth year] Today's Date: 04/25/2019 Time: SLP Start Time (ACUTE ONLY): 1210 -SLP Stop Time (ACUTE ONLY): 1239 SLP Time Calculation (min) (ACUTE ONLY): 29 min Past Medical History: Past Medical History: Diagnosis Date . Arthritis  . COPD (chronic obstructive pulmonary disease) ([place])  . Diabetes mellitus without complication ([place])  . Diabetic neuropathy ([place])  . Oxygen dependent 03/30/2018  5L home O2 . Renal disorder   ESRD . Thyroid disease  . TIA (transient ischemic attack)  Past Surgical History: Past Surgical History: Procedure Laterality Date . ABDOMINAL HYSTERECTOMY   . IR FLUORO GUIDE CV LINE RIGHT  11/14/2018 . RECTOCELE REPAIR   . REPLACEMENT TOTAL KNEE BILATERAL Bilateral 2008 . THYROIDECTOMY    80% . VAGINAL WOUND CLOSURE / REPAIR   HPI: Louanne Calvillo is an 74 y.o. female past medical history that includes COPD with chronic hypoxic respiratory failure, end-stage renal disease on dialysis Tuesday Thursday Saturday, hypothyroidism, insulin-dependent diabetes, chronic diastolic CHF, history of CVA, recent admission with acute on chronic respiratory failure secondary to Covid presented to the emergency department March 24 chief complaint increased confusion and headache.  Initial work-up in the emergency department included an MRI without contrast of the brain revealing an acute or subacute right temporal infarct. Prior admission notes include the following- 1/23 CT chest>>"no PE, near complete collapse of both the right middle and right lower lobes with extensive endobronchial debris. Consolidation involving the right upper lobe concerning for pneumonia or  aspiration pneumonia." Speech consulted for swallowing given concerns for aspiration.  Pt is known to SLP services; has had swallow assessments during admissions in October and November.  MBS on 11/30 revealed mild pharyngeal dysphagia with frank penetration of thin liquids to the vocal folds; chin tuck assisted in airway protection.  F/U MBS recommended during [place] admission, but pt could not tolerate assessment, Discharged on Dys 3/nectar.  Subjective: cooperative Assessment / Plan / Recommendation CHL IP CLINICAL IMPRESSIONS 04/25/2019 Clinical Impression Patient presents with mild oropharyngeal dysphagia. Oral phase is remarkable for decreased bolus cohesion resulting in premature spillage over the epiglottis and oral residue. Pt required Mod cues to initate multiple swallows, reducing oral residue. Pt has a very curved epiglottis, resulting in a narrow vallecular space. When premature spillage occurs, barium passes over the vallecula, and mistimed epiglottic inversion resulted in deep penetration to the vocal cords (PAS 4). A chin tuck was initially utilized to attempt to improve airway protection (per previous notes), however, the patient was observed to have better oral control when not using a chin tuck. This prevented premature spillage and penetration. Trace esophageal backflow was noted with all consistencies, and a minimal amount of barium was observed in the esophagus. Recommend mechanical soft diet (Dys 3), thin liquids, and meds crushed in puree. Ensure the pt is following general aspiration precautions, checking intermittently for oral residue.  SLP Visit Diagnosis Dysphagia, unspecified (R13.10) Attention and concentration deficit following -- Frontal lobe and executive function deficit following -- Impact on safety and function Mild aspiration risk   CHL IP TREATMENT RECOMMENDATION 04/25/2019 Treatment Recommendations Therapy as outlined in treatment plan below   Prognosis 04/25/2019 Prognosis for  Safe Diet Advancement Good Barriers to Reach Goals -- Barriers/Prognosis Comment -- CHL IP DIET RECOMMENDATION 04/25/2019 SLP Diet Recommendations  Dysphagia 3 (Mech soft) solids;Thin liquid Liquid Administration via Straw;Cup Medication Administration Crushed with puree Compensations Slow rate;Small sips/bites Postural Changes Seated upright at 90 degrees   CHL IP OTHER RECOMMENDATIONS 04/25/2019 Recommended Consults Consider esophageal assessment Oral Care Recommendations Oral care BID Other Recommendations --   CHL IP FOLLOW UP RECOMMENDATIONS 04/25/2019 Follow up Recommendations Skilled Nursing facility   [facility] IP FREQUENCY AND DURATION 04/25/2019 Speech Therapy Frequency (ACUTE ONLY) min 2x/week Treatment Duration 2 weeks      CHL IP ORAL PHASE 04/25/2019 Oral Phase Impaired Oral - Pudding Teaspoon -- Oral - Pudding Cup -- Oral - Honey Teaspoon -- Oral - Honey Cup -- Oral - Nectar Teaspoon -- Oral - Nectar Cup -- Oral - Nectar Straw Lingual/palatal residue;Decreased bolus cohesion;Premature spillage Oral - Thin Teaspoon -- Oral - Thin Cup -- Oral - Thin Straw Lingual/palatal residue;Decreased bolus cohesion;Premature spillage Oral - Puree Lingual/palatal residue;Impaired mastication;Delayed oral transit Oral - Mech Soft -- Oral - Regular Impaired mastication;Delayed oral transit;Lingual/palatal residue Oral - Multi-Consistency -- Oral - Pill -- Oral Phase - Comment --  CHL IP PHARYNGEAL PHASE 04/25/2019 Pharyngeal Phase Impaired Pharyngeal- Pudding Teaspoon -- Pharyngeal -- Pharyngeal- Pudding Cup -- Pharyngeal -- Pharyngeal- Honey Teaspoon -- Pharyngeal -- Pharyngeal- Honey Cup -- Pharyngeal -- Pharyngeal- Nectar Teaspoon -- Pharyngeal -- Pharyngeal- Nectar Cup -- Pharyngeal -- Pharyngeal- Nectar Straw Penetration/Aspiration during swallow;Reduced epiglottic inversion;Compensatory strategies attempted (with notebox) Pharyngeal Material enters airway, CONTACTS cords and then ejected out Pharyngeal- Thin Teaspoon --  Pharyngeal -- Pharyngeal- Thin Cup -- Pharyngeal -- Pharyngeal- Thin Straw Penetration/Aspiration during swallow;Reduced epiglottic inversion;Compensatory strategies attempted (with notebox) Pharyngeal Material enters airway, CONTACTS cords and then ejected out Pharyngeal- Puree WFL Pharyngeal -- Pharyngeal- Mechanical Soft -- Pharyngeal -- Pharyngeal- Regular WFL Pharyngeal -- Pharyngeal- Multi-consistency -- Pharyngeal -- Pharyngeal- Pill -- Pharyngeal -- Pharyngeal Comment --  CHL IP CERVICAL ESOPHAGEAL PHASE 04/25/2019 Cervical Esophageal Phase Impaired Pudding Teaspoon -- Pudding Cup -- Honey Teaspoon -- Honey Cup -- Nectar Teaspoon -- Nectar Cup -- Nectar Straw Esophageal backflow into the pharynx Thin Teaspoon -- Thin Cup -- Thin Straw Esophageal backflow into the pharynx Puree Esophageal backflow into the pharynx Mechanical Soft -- Regular Esophageal backflow into the larynx Multi-consistency -- Pill -- Cervical Esophageal Comment -- Osie Bond., M.A. [place] Pager [phone] Office [phone] 04/25/2019, 3:51 PM              ECHOCARDIOGRAM COMPLETE  Result Date: 04/21/2019    ECHOCARDIOGRAM REPORT   Patient Name:   TAMMARA MASSING Date of Exam: 04/21/2019 Medical Rec #:  [id number]      Height:       61.0 in Accession #:    [id number]     Weight:       188.3 lb Date of Birth:  [birth year]      BSA:          1.841 m Patient Age:    42 years       BP:           128/82 mmHg Patient Gender: F              HR:           108 bpm. Exam Location:  Inpatient Procedure: 2D Echo, Cardiac Doppler and Color Doppler Indications:    Stroke 434.91 / I163.9  History:        Patient has prior history of Echocardiogram examinations, most  recent 09/16/2018. TIA and COPD; Risk Factors:Diabetes. Thyroid                 disease.  Sonographer:    Jonelle Sidle Dance Referring Phys: [id number] KAREN M BLACK  Sonographer Comments: Technically difficult study due to poor echo windows and suboptimal  parasternal window. IMPRESSIONS  1. Left ventricular ejection fraction, by estimation, is 60 to 65%. The left ventricle has normal function. Left ventricular endocardial border not optimally defined to evaluate regional wall motion. Indeterminate diastolic filling due to E-A fusion.  2. Right ventricular systolic function is normal. The right ventricular size is normal. Tricuspid regurgitation signal is inadequate for assessing PA pressure.  3. The mitral valve is degenerative. No evidence of mitral valve regurgitation. No evidence of mitral stenosis.  4. The aortic valve is grossly normal. Aortic valve regurgitation is not visualized. No aortic stenosis is present.  5. The inferior vena cava is normal in size with greater than 50% respiratory variability, suggesting right atrial pressure of 3 mmHg. Conclusion(s)/Recommendation(s): Very poor windows for TTE. Globally normal LV function but unable to exclude RWMA. FINDINGS  Left Ventricle: Left ventricular ejection fraction, by estimation, is 60 to 65%. The left ventricle has normal function. Left ventricular endocardial border not optimally defined to evaluate regional wall motion. The left ventricular internal cavity size was normal in size. There is no left ventricular hypertrophy. Indeterminate diastolic filling due to E-A fusion. Right Ventricle: The right ventricular size is normal. No increase in right ventricular wall thickness. Right ventricular systolic function is normal. Tricuspid regurgitation signal is inadequate for assessing PA pressure. Left Atrium: Left atrial size was normal in size. Right Atrium: Right atrial size was normal in size. Pericardium: A small pericardial effusion is present. The pericardial effusion is anterior to the right ventricle. Mitral Valve: The mitral valve is degenerative in appearance. There is mild thickening of the mitral valve leaflet(s). There is mild calcification of the mitral valve leaflet(s). Mild to moderate mitral  annular calcification. No evidence of mitral valve regurgitation. No evidence of mitral valve stenosis. Tricuspid Valve: The tricuspid valve is grossly normal. Tricuspid valve regurgitation is trivial. No evidence of tricuspid stenosis. Aortic Valve: The aortic valve is grossly normal. Aortic valve regurgitation is not visualized. No aortic stenosis is present. Pulmonic Valve: The pulmonic valve was grossly normal. Pulmonic valve regurgitation is not visualized. Aorta: The aortic root is normal in size and structure. Venous: The inferior vena cava is normal in size with greater than 50% respiratory variability, suggesting right atrial pressure of 3 mmHg. IAS/Shunts: The atrial septum is grossly normal.  LEFT VENTRICLE PLAX 2D LVIDd:         4.00 cm LVIDs:         3.40 cm LV PW:         1.00 cm LV IVS:        1.10 cm LVOT diam:     1.90 cm LV SV:         36 LV SV Index:   20 LVOT Area:     2.84 cm  RIGHT VENTRICLE            IVC RV Basal diam:  2.30 cm    IVC diam: 1.55 cm RV S prime:     9.90 cm/s TAPSE (M-mode): 1.3 cm LEFT ATRIUM             Index       RIGHT ATRIUM  Index LA diam:        3.50 cm 1.90 cm/m  RA Area:     8.22 cm LA Vol (A2C):   40.2 ml 21.84 ml/m RA Volume:   14.50 ml 7.88 ml/m LA Vol (A4C):   41.7 ml 22.65 ml/m LA Biplane Vol: 42.7 ml 23.20 ml/m  AORTIC VALVE LVOT Vmax:   78.70 cm/s LVOT Vmean:  47.650 cm/s LVOT VTI:    0.127 m  AORTA Ao Root diam: 2.70 cm MITRAL VALVE MV Area (PHT): 5.46 cm     SHUNTS MV Decel Time: 139 msec     Systemic VTI:  0.13 m MV E velocity: 106.00 cm/s  Systemic Diam: 1.90 cm Eleonore Chiquito MD Electronically signed by Eleonore Chiquito MD Signature Date/Time: 04/21/2019/12:31:36 PM    Final        Subjective: Patient seen and examined at bedside.  Poor historian.  Sleepy, wakes up only very slightly, hardly answers any questions.  No overnight fever, black or bloody stools reported by nursing staff.    Discharge Exam: Vitals:   05/08/19 0805 05/08/19  0900  BP: 114/71 133/84  Pulse: 94 94  Resp: 18   Temp: 98.3 F (36.8 C) 98.4 F (36.9 C)  SpO2: 93% 93%    General exam: No acute distress.  Elderly female lying in bed.  Very poor historian.  Sleepy, wakes up only very slightly, hardly answers any questions respiratory system: Bilateral decreased breath sounds at bases with some crackles.  No wheezing  cardiovascular system: Rate controlled, S1-S2 heard Gastrointestinal system: Abdomen is nondistended, soft and nontender.  Normal bowel sounds heard  extremities: Bilateral mild lower extremity edema present; no cyanosis   The results of significant diagnostics from this hospitalization (including imaging, microbiology, ancillary and laboratory) are listed below for reference.     Microbiology: Recent Results (from the past 240 hour(s))  SARS CORONAVIRUS 2 (TAT 6-24 HRS) Nasopharyngeal Nasopharyngeal Swab     Status: None   Collection Time: 05/04/19 10:28 PM   Specimen: Nasopharyngeal Swab  Result Value Ref Range Status   SARS Coronavirus 2 NEGATIVE NEGATIVE Final    Comment: (NOTE) SARS-CoV-2 target nucleic acids are NOT DETECTED. The SARS-CoV-2 RNA is generally detectable in upper and lower respiratory specimens during the acute phase of infection. Negative results do not preclude SARS-CoV-2 infection, do not rule out co-infections with other pathogens, and should not be used as the sole basis for treatment or other patient management decisions. Negative results must be combined with clinical observations, patient history, and epidemiological information. The expected result is Negative. Fact Sheet for Patients: SugarRoll.be Fact Sheet for Healthcare Providers: https://www.woods-mathews.com/ This test is not yet approved or cleared by the [place] FDA and  has been authorized for detection and/or diagnosis of SARS-CoV-2 by FDA under an Emergency Use Authorization (EUA). This EUA  will remain  in effect (meaning this test can be used) for the duration of the COVID-19 declaration under Section 56 4(b)(1) of the Act, 21 U.S.C. section 360bbb-3(b)(1), unless the authorization is terminated or revoked sooner. Performed at [facility], [place] [address], [place], [place] [address]   SARS CORONAVIRUS 2 (TAT 6-24 HRS) Nasopharyngeal Nasopharyngeal Swab     Status: None   Collection Time: 05/07/19  4:38 PM   Specimen: Nasopharyngeal Swab  Result Value Ref Range Status   SARS Coronavirus 2 NEGATIVE NEGATIVE Final    Comment: (NOTE) SARS-CoV-2 target nucleic acids are NOT DETECTED. The SARS-CoV-2 RNA is generally  detectable in upper and lower respiratory specimens during the acute phase of infection. Negative results do not preclude SARS-CoV-2 infection, do not rule out co-infections with other pathogens, and should not be used as the sole basis for treatment or other patient management decisions. Negative results must be combined with clinical observations, patient history, and epidemiological information. The expected result is Negative. Fact Sheet for Patients: SugarRoll.be Fact Sheet for Healthcare Providers: https://www.woods-mathews.com/ This test is not yet approved or cleared by the [place] FDA and  has been authorized for detection and/or diagnosis of SARS-CoV-2 by FDA under an Emergency Use Authorization (EUA). This EUA will remain  in effect (meaning this test can be used) for the duration of the COVID-19 declaration under Section 56 4(b)(1) of the Act, 21 U.S.C. section 360bbb-3(b)(1), unless the authorization is terminated or revoked sooner. Performed at [facility], [place] [address], [place], [place] [address]      Labs: BNP (last 3 results) Recent Labs    09/09/18 0031 11/10/18 1706 02/09/19 1946  BNP 312.3* 157.1* 629.5*   Basic Metabolic Panel: Recent Labs  Lab 05/04/19 1621  05/04/19 1621 05/04/19 1630 05/05/19 0727 05/06/19 0312 05/07/19 0411 05/08/19 0414  NA 137   < > 131* 136 136 137 137  K 3.5   < > 3.3* 3.6 3.8 3.7 4.1  CL 93*   < > 93* 93* 93* 97* 96*  CO2 26  --   --  25 25 24 23   GLUCOSE 113*   < > 113* 82 91 115* 110*  BUN 11   < > 12 15 24* 22 38*  CREATININE 3.17*   < > 3.20* 3.87* 5.17* 5.17* 6.95*  CALCIUM 8.9  --   --  9.6 9.6 9.9 9.6  PHOS  --   --   --  7.3*  --   --   --    < > = values in this interval not displayed.   Liver Function Tests: Recent Labs  Lab 05/04/19 1621 05/05/19 0727 05/06/19 0312  AST 17 14* 13*  ALT 11 13 11   ALKPHOS 104 77 84  BILITOT 1.3* 1.1 1.5*  PROT 6.6 5.6* 5.8*  ALBUMIN 3.1* 2.7* 2.8*   No results for input(s): LIPASE, AMYLASE in the last 168 hours. Recent Labs  Lab 05/06/19 0312  AMMONIA 31   CBC: Recent Labs  Lab 05/04/19 1621 05/04/19 1621 05/04/19 1630 05/05/19 1008 05/06/19 0312 05/07/19 0411 05/08/19 0414  WBC 20.7*  --   --  8.0 9.3 9.1 9.7  NEUTROABS 15.9*  --   --  5.2 6.1 5.9 6.1  HGB 4.7*   < > 5.1* 12.6 13.2 13.8 13.0  HCT 15.9*   < > 15.0* 40.9 42.5 44.5 41.9  MCV 101.3*  --   --  92.3 91.4 94.3 93.9  PLT 307  --   --  153 159 148* 165   < > = values in this interval not displayed.   Cardiac Enzymes: No results for input(s): CKTOTAL, CKMB, CKMBINDEX, TROPONINI in the last 168 hours. BNP: Invalid input(s): POCBNP CBG: Recent Labs  Lab 05/07/19 0638 05/07/19 1139 05/07/19 1633 05/07/19 2116 05/08/19 0621  GLUCAP 111* 110* 126* 141* 102*   D-Dimer No results for input(s): DDIMER in the last 72 hours. Hgb A1c No results for input(s): HGBA1C in the last 72 hours. Lipid Profile No results for input(s): CHOL, HDL, LDLCALC, TRIG, CHOLHDL, LDLDIRECT in the last 72 hours. Thyroid function studies  Recent Labs    05/06/19 0312  TSH 6.920*   Anemia work up Recent Labs    05/06/19 0312  VITAMINB12 439  FOLATE 10.8   Urinalysis    Component Value  Date/Time   COLORURINE YELLOW 04/20/2018 0301   APPEARANCEUR HAZY (A) 04/20/2018 0301   LABSPEC 1.009 04/20/2018 0301   PHURINE 9.0 (H) 04/20/2018 0301   GLUCOSEU 150 (A) 04/20/2018 0301   HGBUR MODERATE (A) 04/20/2018 0301   BILIRUBINUR NEGATIVE 04/20/2018 0301   KETONESUR NEGATIVE 04/20/2018 0301   PROTEINUR 100 (A) 04/20/2018 0301   NITRITE NEGATIVE 04/20/2018 0301   LEUKOCYTESUR NEGATIVE 04/20/2018 0301   Sepsis Labs Invalid input(s): PROCALCITONIN,  WBC,  LACTICIDVEN Microbiology Recent Results (from the past 240 hour(s))  SARS CORONAVIRUS 2 (TAT 6-24 HRS) Nasopharyngeal Nasopharyngeal Swab     Status: None   Collection Time: 05/04/19 10:28 PM   Specimen: Nasopharyngeal Swab  Result Value Ref Range Status   SARS Coronavirus 2 NEGATIVE NEGATIVE Final    Comment: (NOTE) SARS-CoV-2 target nucleic acids are NOT DETECTED. The SARS-CoV-2 RNA is generally detectable in upper and lower respiratory specimens during the acute phase of infection. Negative results do not preclude SARS-CoV-2 infection, do not rule out co-infections with other pathogens, and should not be used as the sole basis for treatment or other patient management decisions. Negative results must be combined with clinical observations, patient history, and epidemiological information. The expected result is Negative. Fact Sheet for Patients: SugarRoll.be Fact Sheet for Healthcare Providers: https://www.woods-mathews.com/ This test is not yet approved or cleared by the [place] FDA and  has been authorized for detection and/or diagnosis of SARS-CoV-2 by FDA under an Emergency Use Authorization (EUA). This EUA will remain  in effect (meaning this test can be used) for the duration of the COVID-19 declaration under Section 56 4(b)(1) of the Act, 21 U.S.C. section 360bbb-3(b)(1), unless the authorization is terminated or revoked sooner. Performed at [facility], [place] [address], [place], [place] [address]   SARS CORONAVIRUS 2 (TAT 6-24 HRS) Nasopharyngeal Nasopharyngeal Swab     Status: None   Collection Time: 05/07/19  4:38 PM   Specimen: Nasopharyngeal Swab  Result Value Ref Range Status   SARS Coronavirus 2 NEGATIVE NEGATIVE Final    Comment: (NOTE) SARS-CoV-2 target nucleic acids are NOT DETECTED. The SARS-CoV-2 RNA is generally detectable in upper and lower respiratory specimens during the acute phase of infection. Negative results do not preclude SARS-CoV-2 infection, do not rule out co-infections with other pathogens, and should not be used as the sole basis for treatment or other patient management decisions. Negative results must be combined with clinical observations, patient history, and epidemiological information. The expected result is Negative. Fact Sheet for Patients: SugarRoll.be Fact Sheet for Healthcare Providers: https://www.woods-mathews.com/ This test is not yet approved or cleared by the [place] FDA and  has been authorized for detection and/or diagnosis of SARS-CoV-2 by FDA under an Emergency Use Authorization (EUA). This EUA will remain  in effect (meaning this test can be used) for the duration of the COVID-19 declaration under Section 56 4(b)(1) of the Act, 21 U.S.C. section 360bbb-3(b)(1), unless the authorization is terminated or revoked sooner. Performed at [facility], [place] [address] [place], [place] [address]      Time coordinating discharge: 35 minutes  SIGNED:   Aline August, MD  [facility] 05/08/2019, 10:53 AM

## 2019-05-09 ENCOUNTER — Encounter: Payer: Self-pay | Admitting: Internal Medicine

## 2019-05-09 ENCOUNTER — Non-Acute Institutional Stay (SKILLED_NURSING_FACILITY): Payer: Medicare Other | Admitting: Internal Medicine

## 2019-05-09 DIAGNOSIS — J449 Chronic obstructive pulmonary disease, unspecified: Secondary | ICD-10-CM | POA: Diagnosis not present

## 2019-05-09 DIAGNOSIS — Z992 Dependence on renal dialysis: Secondary | ICD-10-CM

## 2019-05-09 DIAGNOSIS — D649 Anemia, unspecified: Secondary | ICD-10-CM | POA: Diagnosis not present

## 2019-05-09 DIAGNOSIS — N186 End stage renal disease: Secondary | ICD-10-CM

## 2019-05-09 DIAGNOSIS — R29818 Other symptoms and signs involving the nervous system: Secondary | ICD-10-CM

## 2019-05-09 DIAGNOSIS — E890 Postprocedural hypothyroidism: Secondary | ICD-10-CM

## 2019-05-09 DIAGNOSIS — R4189 Other symptoms and signs involving cognitive functions and awareness: Secondary | ICD-10-CM

## 2019-05-09 NOTE — Assessment & Plan Note (Addendum)
TSH 6.92 on 05/06/2019 L-thyroxine had been increased to 112 mcg on 04/13/2019.  Repeat TSH would be due in late April to mid May.

## 2019-05-09 NOTE — Patient Instructions (Signed)
See assessment and plan under each diagnosis in the problem list and acutely for this visit 

## 2019-05-09 NOTE — Assessment & Plan Note (Signed)
Patient denies any bleeding dyscrasias but she has significant neurocognitive deficit. Staff reports no melena or rectal bleeding or any other bleeding dyscrasias.

## 2019-05-09 NOTE — Assessment & Plan Note (Addendum)
Thrice weekly HD will be continued. If she remains a Full Code.  Renal function and CBC will be checked at HD.

## 2019-05-09 NOTE — Progress Notes (Signed)
NURSING HOME LOCATION:  [facility] ROOM NUMBER: 317/[id number]  CODE STATUS:  Full Code  PCP:  Flossie Buffy, NP  This is a readmission note to [facility] performed on this date less than 30 days from date of readmission.  HPI: Patient was readmitted 4/8-4/12/2019 sent to ED from HD with generalized weakness.  Hemodialysis could not be completed because of the weakness and encephalopathy pathic delirium.  In the ED hemoglobin was 4.7 with no active bleeding dyscrasias.  Stool guaiac was negative.  Prior hemoglobin was 10.1 on 04/22/2019. She received 3 units of packed red cells with subsequent stabilization of her hemoglobin.  No overt GI bleeding was noted.  No further evaluation was pursued as hemoglobin remained stable. Palliative care was consulted for goals of care & code status.  Past medical and surgical history: Includes insulin-dependent diabetes with peripheral neuropathy, history of TIA,, history of CVA, chronic diastolic congestive heart failure, hypothyroidism, and O2 dependent chronic hypoxic respiratory failure/COPD. Apparently 80% of her thyroid has been resected resulting in iatrogenic hypothyroidism.  Social history: Nondrinker; past history of 35 pack years of smoking. Her son,Eric, states that she lives with his brother.  Palliative care did see the patient in the hospital and recommend DNR. [name] does state that she underwent CPR at hemodialysis several months ago and sustained thoracic musculoskeletal injuries.  Apparently his brother has not come to grips with making her a DNR despite these events & advanced co-morbidities.   Family history: Reviewed   Review of systems:  Could not be completed due to dementia. Date given as "Wednesday 2021.  She could not give her son's birthday beyond the month. He stated that she is becoming more more confused over the last several months.  She has been essentially noncommunicative with minimal verbalization.  She  remains bedridden and nonambulatory.  She must be transported by [facility].   Hearing loss  also impacts obtaining a history.  She made the comment that she felt "fine".  She denies any bleeding dyscrasias.  She is anuric.  Her son states that she is incontinent of stool.  Physical exam:  Pertinent or positive findings: She appears her stated age and chronically ill.  She is hard of hearing.  Facies are blank.  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  Hair is thin over the crown.  She has only a few lower teeth left.  The maxilla is edentulous.  Her mouth droops to the right.  Heart sounds are distant and irregular.  Breath sounds are also decreased.  Abdomen is protuberant.  Pedal pulses are decreased.  She has trace edema at the sock line.  She is profoundly weak to opposition in all extremities but especially the lower extremities.  She has a smooth papule over the dorsum of the left hand.  She has scattered bruising over the forearms.  The skin over the shins is dry and slightly sclerotic.  General appearance:  no acute distress, increased work of breathing is present.   Lymphatic: No lymphadenopathy about the head, neck, axilla. Eyes: No conjunctival inflammation or lid edema is present. There is no scleral icterus. Ears:  External ear exam shows no significant lesions or deformities.   Nose:  External nasal examination shows no deformity or inflammation. Nasal mucosa are pink and moist without lesions, exudates Neck:  No thyromegaly, masses, tenderness noted.    Heart:  No gallop, murmur, click, rub.  Lungs: without wheezes, rhonchi, rales, rubs. Abdomen: Bowel sounds are normal.  Abdomen is soft and nontender with no organomegaly, hernias, masses. GU: Deferred  Extremities:  No cyanosis, clubbing Neurologic exam:Balance, Rhomberg, finger to nose testing could not be completed due to clinical state Skin: Warm & dry  No significant rash.  See clinical summary under each active problem in  the Problem List with associated updated therapeutic plan

## 2019-05-10 NOTE — Assessment & Plan Note (Signed)
Perform SLUMS

## 2019-05-10 NOTE — Assessment & Plan Note (Signed)
Continue BiPAP and pulmonary toilet.  Short course of prednisone because of persistent hypoxia

## 2019-05-11 ENCOUNTER — Encounter: Payer: Self-pay | Admitting: Adult Health

## 2019-05-11 ENCOUNTER — Non-Acute Institutional Stay (SKILLED_NURSING_FACILITY): Payer: Medicare Other | Admitting: Adult Health

## 2019-05-11 DIAGNOSIS — Z7189 Other specified counseling: Secondary | ICD-10-CM | POA: Diagnosis not present

## 2019-05-11 DIAGNOSIS — D649 Anemia, unspecified: Secondary | ICD-10-CM | POA: Diagnosis not present

## 2019-05-11 DIAGNOSIS — J449 Chronic obstructive pulmonary disease, unspecified: Secondary | ICD-10-CM

## 2019-05-11 DIAGNOSIS — N186 End stage renal disease: Secondary | ICD-10-CM | POA: Diagnosis not present

## 2019-05-11 DIAGNOSIS — Z992 Dependence on renal dialysis: Secondary | ICD-10-CM

## 2019-05-11 DIAGNOSIS — J9611 Chronic respiratory failure with hypoxia: Secondary | ICD-10-CM

## 2019-05-11 DIAGNOSIS — E039 Hypothyroidism, unspecified: Secondary | ICD-10-CM

## 2019-05-11 NOTE — Progress Notes (Signed)
Location:  [facility] Room Number: 317-A Place of Service:  SNF (31) Provider:  Durenda Age, DNP, FNP-BC  Patient Care Team: Nche, Charlene Brooke, NP as PCP - General (Internal Medicine)  Extended Emergency Contact Information Primary Emergency Contact: [facility] Phone: [phone] Mobile Phone: [phone] Relation: Son Secondary Emergency Contact: [facility] Phone: [phone] Mobile Phone: [phone] Relation: Daughter  Code Status:  Full Code  Goals of care: Advanced Directive information Advanced Directives 05/09/2019  Does Patient Have a Medical Advance Directive? Yes  Type of Advance Directive (No Data)  Does patient want to make changes to medical advance directive? No - Patient declined  Copy of [place] in Chart? -  Would patient like information on creating a medical advance directive? -     Chief Complaint  Patient presents with  . Acute Visit    Patient is seen for a 72-hour care plan meeting.    HPI:  Pt is a 74 y.o. female seen today for a 72-hour care plan meeting.  She was readmitted to [facility] on 05/08/19 post hospitalization 05/04/19 to 05/08/2019 for acute on chronic anemia of chronic disease.  She presented to the hospital with hemoglobin of 4.7 with previous hemoglobin of 10.1 on 04/22/2019.  Stool guaiac was negative and no overt signs of bleeding.  She was transfused 3 units PRBC.Marland Kitchen Hgb when discharged from the hospital was 13.  Of note, before hospitalization she was having a short-term rehabilitation at [facility] post hospitalization 04/19/19 to 04/25/19 for stroke.  CT of the head did not show any acute stroke.  MRI of the brain showed small acute to subacute right temporal infarct.  Moderate to advanced chronic microvascular ischemic changes.  MRA of the head and neck showed mild atheromatous irregularity about the carotid bifurcations  without hemodynamically significant stenosis.  Echocardiogram showed left ventricular ejection fraction, by estimation, is 60 to 65%.  She will continue on aspirin 81 mg daily and Lipitor 40 mg daily.  Neurology follow-up in 4 to 6 weeks was recommended.  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  Teleconference was attended by son, [name], NP, Education officer, museum and MDS coordinator. Son verbalized that he had a conversation with resident and both agreed to have full code status. Ryan, son, decline for palliative care to follow resident for now. Daughter has been meeting resident at dialysis center because resident gets anxious at dialysis. Discussed medications, vital signs and weight. The meeting lasted for 20 minutes.   Past Medical History:  Diagnosis Date  . Arthritis   . COPD (chronic obstructive pulmonary disease) ([place])   . Diabetic neuropathy ([place])   . 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 Renal disorder    ESRD  . Thyroid disease   . TIA (transient ischemic attack)   . Type 2 diabetes mellitus with peripheral neuropathy [facility])    Past Surgical History:  Procedure Laterality Date  . ABDOMINAL HYSTERECTOMY    . IR FLUORO GUIDE CV LINE RIGHT  11/14/2018  . RECTOCELE REPAIR    . REPLACEMENT TOTAL KNEE BILATERAL Bilateral 2008  . THYROIDECTOMY     80%  . VAGINAL WOUND CLOSURE / REPAIR      Allergies  Allergen Reactions  . Avelox [Moxifloxacin Hcl In Nacl] Other (See Comments)    Unknown reaction  . Codeine Anaphylaxis  . Penicillins Rash    Did  it involve swelling of the face/tongue/throat, SOB, or low BP? Unk Did it involve sudden or severe rash/hives, skin peeling, or any reaction on the inside of your mouth or nose? Yes Did you need to seek medical attention at a hospital or doctor's office? Unk When did it last happen? Unk If all above answers are "NO", may proceed with cephalosporin use.   .  Sulfa Antibiotics Other (See Comments)    Unknown reaction  . Dextromethorphan-Guaifenesin Other (See Comments)    Reported by [facility] - unknown reaction. Pt states she is not allergic to dextromethorphan-guaifenesin.  . Shellfish Allergy Hives, Itching and Rash    Outpatient Encounter Medications as of 05/11/2019  Medication Sig  . acetaminophen (TYLENOL) 325 MG tablet Take 650 mg by mouth every 6 (six) hours as needed for mild pain (for arthritic pain).   Marland Kitchen ascorbic acid (VITAMIN C) 500 MG tablet Take 1 tablet (500 mg total) by mouth daily.  Marland Kitchen b complex-vitamin c-folic acid (NEPHRO-VITE) 0.8 MG TABS tablet Take 1 tablet by mouth at bedtime.  . benzonatate (TESSALON) 200 MG capsule Take 1 capsule (200 mg total) by mouth 2 (two) times daily as needed for cough.  . Darbepoetin Alfa (ARANESP) 25 MCG/0.42ML SOSY injection Inject 0.42 mLs (25 mcg total) into the vein every Tuesday with hemodialysis.  Marland Kitchen docusate sodium (COLACE) 100 MG capsule Take 100 mg by mouth every morning.  Marland Kitchen doxercalciferol (HECTOROL) 4 MCG/2ML injection Inject 1.5 mLs (3 mcg total) into the vein Every Tuesday,Thursday,and Saturday with dialysis.  Marland Kitchen FLUoxetine (PROZAC) 10 MG tablet Take 1 tablet (10 mg total) by mouth daily.  Marland Kitchen guaiFENesin (MUCINEX) 600 MG 12 hr tablet Take 1 tablet (600 mg total) by mouth 2 (two) times daily.  Marland Kitchen levalbuterol (XOPENEX HFA) 45 MCG/ACT inhaler Inhale 2 puffs into the lungs every 4 (four) hours as needed for wheezing.   Marland Kitchen levothyroxine (SYNTHROID) 112 MCG tablet Take 1 tablet (112 mcg total) by mouth daily before breakfast. 1 AM  . Lidocaine (ASPERCREME LIDOCAINE) 4 % PTCH Apply 1 patch topically daily as needed (Pain).  . Lubricants (K-Y LUBRICANT JELLY SENSITIVE EX) Place 1 application into both nostrils as needed (as directed- for lubrication).   . midodrine (PROAMATINE) 10 MG tablet Take 10 mg by mouth See admin instructions. Take 10 mg by mouth on Tuesday, Thursday, Saturday 45 minutes  before dialysis  . montelukast (SINGULAIR) 10 MG tablet Take 10 mg by mouth at bedtime.   . NON FORMULARY Renal Diet Mechanical soft Nectar thick liquids  . Nutritional Supplements (FEEDING SUPPLEMENT, NEPRO CARB STEADY,) LIQD Take 237 mLs by mouth 2 (two) times daily between meals.  . nystatin (MYCOSTATIN/NYSTOP) powder Apply 1 g topically 2 (two) times daily as needed (as directed- to any rashes).   . OXYGEN Inhale 4 L into the lungs continuous.  Marland Kitchen oxymetazoline (AFRIN) 0.05 % nasal spray Place 1 spray into both nostrils 2 (two) times daily as needed for congestion.  . senna (SENOKOT) 8.6 MG TABS tablet Take 1 tablet by mouth every morning.  . sevelamer carbonate (RENVELA) 800 MG tablet Take 1 tablet (800 mg total) by mouth daily. With a snack  . SIMPLY SALINE NA Place 2 sprays into the nose as needed (congestion).   . [DISCONTINUED] nystatin ointment (MYCOSTATIN) Apply 1 application topically 2 (two) times daily.  . [DISCONTINUED] heparin 5000 UNIT/ML injection Inject 1.5 mLs (7,500 Units total) into the skin every 8 (eight) hours.  . [DISCONTINUED] ipratropium-albuterol (DUONEB) 0.5-2.5 (3) MG/3ML  SOLN Take 3 mLs by nebulization See admin instructions. Nebulize and inhale 3 ml's into the lungs every four hours, while awake  . [DISCONTINUED] lidocaine (LIDODERM) 5 % Place 1 patch onto the skin daily as needed (pain). Remove & Discard patch within 12 hours or as directed by MD  . [DISCONTINUED] multivitamin (RENA-VIT) TABS tablet Take 1 tablet by mouth at bedtime.  . [DISCONTINUED] VENTOLIN HFA 108 (90 Base) MCG/ACT inhaler Inhale 2 puffs into the lungs every 6 (six) hours as needed for wheezing or shortness of breath.   No facility-administered encounter medications on file as of 05/11/2019.    Review of Systems  GENERAL: No change in appetite, no fatigue, no weight changes, no fever, chills or weakness MOUTH and THROAT: Denies oral discomfort, gingival pain or bleeding RESPIRATORY: no  cough, SOB, DOE, wheezing, hemoptysis CARDIAC: No chest pain, edema or palpitations GI: No abdominal pain, diarrhea, constipation, heart burn, nausea or vomiting GU: Denies dysuria, frequency, hematuria, incontinence, or discharge NEUROLOGICAL: Denies dizziness, syncope, numbness, or headache PSYCHIATRIC: Denies feelings of depression or anxiety. No report of hallucinations, insomnia, paranoia, or agitation    Immunization History  Administered Date(s) Administered  . Hepatitis B, adult 07/27/2017, 09/30/2017, 02/01/2018  . Influenza, High Dose Seasonal PF 10/25/2018  . Influenza-Unspecified 11/25/2018   Pertinent  Health Maintenance Due  Topic Date Due  . FOOT EXAM  Never done  . OPHTHALMOLOGY EXAM  Never done  . COLONOSCOPY  Never done  . DEXA SCAN  Never done  . PNA vac Low Risk Adult (1 of 2 - PCV13) Never done  . URINE MICROALBUMIN  07/01/2019  . INFLUENZA VACCINE  08/27/2019  . HEMOGLOBIN A1C  10/21/2019   Fall Risk  07/01/2018  Falls in the past year? 0     Vitals:   05/11/19 1035  BP: 130/84  Pulse: (!) 103  Resp: 17  Temp: 97.7 F (36.5 C)  TempSrc: Oral  Weight: 185 lb 14.4 oz (84.3 kg)  Height: 5\' 1"  (1.549 m)   Body mass index is 35.13 kg/m.  Physical Exam  GENERAL APPEARANCE: Well nourished. In no acute distress.  Obese  SKIN:  Skin is warm and dry.  MOUTH and THROAT: Lips are without lesions. Oral mucosa is moist and without lesions. Tongue is normal in shape, size, and color and without lesions RESPIRATORY: Breathing is even & unlabored, BS CTAB CARDIAC: RRR, no murmur,no extra heart sounds, no edema GI: Abdomen soft, normal BS, no masses, no tenderness EXTREMITIES: Able to move x4 extremities NEUROLOGICAL: There is no tremor. Speech is clear.  Alert to self, disoriented to time and place. PSYCHIATRIC:  Affect and behavior are appropriate  Labs reviewed: Recent Labs    12/25/18 0045 12/25/18 0045 12/26/18 0927 12/27/18 0400 01/05/19 1800  04/25/19 0901 04/25/19 0901 04/26/19 0332 05/04/19 1621 05/05/19 0727 05/05/19 0727 05/06/19 0312 05/07/19 0411 05/08/19 0414  NA 136   < >  --  132*   < > 138   < > 137   < > 136   < > 136 137 137  K 3.9   < >  --  4.8   < > 4.5   < > 3.4*   < > 3.6   < > 3.8 3.7 4.1  CL 93*   < >  --  94*   < > 96*   < > 95*   < > 93*   < > 93* 97* 96*  CO2 21*   < >  --  18*   < > 25   < > 23   < > 25   < > 25 24 23   GLUCOSE 150*   < >  --  92   < > 112*   < > 144*   < > 82   < > 91 115* 110*  BUN 18   < >  --  49*   < > 32*   < > 9   < > 15   < > 24* 22 38*  CREATININE 4.07*   < >  --  6.82*   < > 6.44*   < > 3.21*   < > 3.87*   < > 5.17* 5.17* 6.95*  CALCIUM 8.3*   < >  --  8.7*   < > 9.8   < > 9.5   < > 9.6   < > 9.6 9.9 9.6  MG 2.2  --  2.3 2.3  --   --   --   --   --   --   --   --   --   --   PHOS 6.0*   < >  --  7.2*   < > 4.5  --  2.7  --  7.3*  --   --   --   --    < > = values in this interval not displayed.   Recent Labs    05/04/19 1621 05/05/19 0727 05/06/19 0312  AST 17 14* 13*  ALT 11 13 11   ALKPHOS 104 77 84  BILITOT 1.3* 1.1 1.5*  PROT 6.6 5.6* 5.8*  ALBUMIN 3.1* 2.7* 2.8*   Recent Labs    05/06/19 0312 05/07/19 0411 05/08/19 0414  WBC 9.3 9.1 9.7  NEUTROABS 6.1 5.9 6.1  HGB 13.2 13.8 13.0  HCT 42.5 44.5 41.9  MCV 91.4 94.3 93.9  PLT 159 148* 165   Lab Results  Component Value Date   TSH 6.920 (H) 05/06/2019   Lab Results  Component Value Date   HGBA1C 5.5 04/20/2019   Lab Results  Component Value Date   CHOL 192 04/20/2019   HDL 53 04/20/2019   LDLCALC 90 04/20/2019   TRIG 244 (H) 04/20/2019   CHOLHDL 3.6 04/20/2019    Significant Diagnostic Results in last 30 days:  CT Head Wo Contrast  Result Date: 04/19/2019 CLINICAL DATA:  Encephalopathy, confusion, diabetes EXAM: CT HEAD WITHOUT CONTRAST TECHNIQUE: Contiguous axial images were obtained from the base of the skull through the vertex without intravenous contrast. COMPARISON:  04/15/2019  FINDINGS: Brain: Chronic small-vessel ischemic changes throughout the periventricular white matter and central pons. No acute infarct or hemorrhage. Lateral ventricles and midline structures are unremarkable. Stable diffuse cerebral atrophy. No acute extra-axial fluid collections. Stable bilateral subdural hygromas along the convexities. No mass effect. Vascular: No hyperdense vessel or unexpected calcification. Skull: Normal. Negative for fracture or focal lesion. Sinuses/Orbits: No acute finding. Other: None IMPRESSION: 1. Stable exam, no acute process. Electronically Signed   By: Randa Ngo M.D.   On: 04/19/2019 23:25   CT HEAD WO CONTRAST  Result Date: 04/15/2019 CLINICAL DATA:  Mental status change EXAM: CT HEAD WITHOUT CONTRAST TECHNIQUE: Contiguous axial images were obtained from the base of the skull through the vertex without intravenous contrast. COMPARISON:  CT head 12/24/2018, 09/08/2018 FINDINGS: Brain: Stable appearance of prior right thalamic and left insular infarcts. Prominent extra-axial CSF spaces towards the vertex may suggest chronic subdural versus hygroma in this patient history  of bilateral subdural hematoma. No evidence of active hemorrhage is seen acute parenchymal hemorrhage hydrocephalus or mass effect/mass lesion. More symmetric prominence of the ventricles, cisterns and sulci compatible with underlying parenchymal volume loss. Patchy areas of white matter hypoattenuation are most compatible with chronic microvascular angiopathy. Vascular: Atherosclerotic calcification of the carotid siphons. No hyperdense vessel. Skull: No calvarial fracture or suspicious osseous lesion. No scalp swelling or hematoma. Sinuses/Orbits: Paranasal sinuses and mastoid air cells are predominantly clear. Included orbital structures are unremarkable. Other: Edentulous with mild mandibular prognathism. IMPRESSION: 1. No CT evidence for acute intracranial process. 2. Remote right thalamic and left insular  infarcts, stable from prior. 3. Prominent extra-axial CSF spaces towards the vertex may suggest chronic subdural versus hygroma in this patient history of bilateral subdural hematoma. 4. Background of parenchymal volume loss and chronic microvascular angiopathy. 5. Intracranial atherosclerosis. Electronically Signed   By: Lovena Le M.D.   On: 04/15/2019 23:17   MR ANGIO NECK WO CONTRAST  Result Date: 04/21/2019 CLINICAL DATA:  Follow-up examination for acute stroke. EXAM: MRA NECK WITHOUT  CONTRAST TECHNIQUE: Multiplanar and multiecho pulse sequences of the neck were obtained without intravenous contrast. Angiographic images of the neck were obtained using MRA technique without and with intravenous contrast. COMPARISON:  Prior MRI from 04/20/2019. FINDINGS: AORTIC ARCH: Examination somewhat technically limited by lack of IV contrast. Aortic arch and origin of the great vessels not assessed on this examination. Partially visualized subclavian arteries are widely patent. RIGHT CAROTID SYSTEM: Right common carotid artery patent from its origin to the bifurcation without stenosis. Mild atheromatous irregularity about the right bifurcation without hemodynamically significant stenosis. Right ICA widely patent to the skull base without stenosis, occlusion, or obvious dissection. LEFT CAROTID SYSTEM: Visualized left common carotid artery widely patent to the bifurcation. Mild atheromatous irregularity about the left bifurcation without definite flow-limiting stenosis. Left ICA patent distally to the skull base without stenosis, occlusion, or definite dissection. VERTEBRAL ARTERIES: Origin of the left vertebral artery not seen. Visualized vertebral arteries widely patent within the neck with antegrade flow, with no appreciable stenosis or other abnormality. Vertebral arteries are largely codominant. IMPRESSION: 1. Mild atheromatous irregularity about the carotid bifurcations without hemodynamically significant  stenosis. Otherwise wide patency of both carotid artery systems within the neck. 2. Wide patency of both vertebral arteries within the neck. Electronically Signed   By: Jeannine Boga M.D.   On: 04/21/2019 01:05   MR BRAIN WO CONTRAST  Result Date: 05/04/2019 CLINICAL DATA:  74 year old female with recent encephalopathy, ataxia. 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 EXAM: MRI HEAD WITHOUT CONTRAST TECHNIQUE: Multiplanar, multiecho pulse sequences of the brain and surrounding structures were obtained without intravenous contrast. COMPARISON:  Brain MRI 04/20/2019. FINDINGS: Brain: The tiny area of posterior right temporal lobe white matter diffusion abnormality has faded since last month. No new restricted diffusion to suggest acute infarction. No midline shift, mass effect, evidence of mass lesion, ventriculomegaly, extra-axial collection or acute intracranial hemorrhage. Cervicomedullary junction and pituitary are within normal limits. Stable gray and white matter signal throughout the brain. Widespread bilateral cerebral white matter and deep gray nuclei T2 and FLAIR hyperintensity. Moderate similar T2 and FLAIR heterogeneity in the pons. No cortical encephalomalacia or chronic cerebral blood products identified. Cerebellum remains within normal limits. Vascular: Major intracranial vascular flow voids are stable. Skull and upper cervical spine: Negative for age visible cervical spine. Bone marrow signal is stable and within normal limits. Sinuses/Orbits: Stable, negative. Other: Persistent bilateral  mastoid effusions. Negative nasopharynx. Other visible internal auditory structures appear grossly normal. Negative scalp and face soft tissues. IMPRESSION: 1. No acute intracranial abnormality. 2. 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 3. Otherwise stable brain with advanced signal changes most compatible with chronic small vessel disease. 4.  Persistent mastoid effusions, most commonly postinflammatory. Electronically Signed   By: Genevie Ann M.D.   On: 05/04/2019 17:42   MR BRAIN WO CONTRAST  Result Date: 04/20/2019 CLINICAL DATA:  Encephalopathy EXAM: MRI HEAD WITHOUT CONTRAST TECHNIQUE: Multiplanar, multiecho pulse sequences of the brain and surrounding structures were obtained without intravenous contrast. COMPARISON:  None. FINDINGS: Brain: There a small focus mildly reduced diffusion in the right temporal lobe. No hemorrhage. There is no intracranial mass, mass effect, or edema. Patchy and confluent areas of T2 hyperintensity in the supratentorial and pontine white matter are nonspecific but probably reflect moderate to advanced chronic microvascular ischemic changes. There is bilateral cerebral convexity dural thickening or minimal residual chronic subdural collections. Prominence of the ventricles and sulci reflects mild generalized parenchymal volume loss. There is no hydrocephalus or extra-axial fluid collection. Vascular: Major vessel flow voids at the skull base are preserved. Skull and upper cervical spine: Normal marrow signal is preserved. Sinuses/Orbits: Paranasal sinuses are aerated. Orbits are unremarkable. Other: Sella is unremarkable.  Nonspecific mastoid effusions. IMPRESSION: Small acute to subacute right temporal infarct. Moderate to advanced chronic microvascular ischemic changes. Dural thickening versus minimal residual chronic subdural collections. Nonspecific mastoid effusions. Electronically Signed   By: Macy Mis M.D.   On: 04/20/2019 10:06   DG Chest Port 1 View  Result Date: 05/04/2019 CLINICAL DATA:  Altered mental status EXAM: PORTABLE CHEST 1 VIEW COMPARISON:  04/24/2019 FINDINGS: Right-sided central line tip in the proximal right atrium as seen previously. Patient rotated towards the right. Aortic atherosclerosis. Left lung shows resolution of atelectasis previously seen at the base. Widespread tiny reticular  nodules remain visible, raising possibility of atypical pneumonia. Chronic volume loss persists at the right base. Fine reticular nodular opacities also visible on this side. IMPRESSION: Chronic volume loss at the right base. Improved aeration at the left base. Tiny reticulo nodular shadows throughout both lungs raising the possibility of atypical pneumonia. Electronically Signed   By: Nelson Chimes M.D.   On: 05/04/2019 16:51   DG CHEST PORT 1 VIEW  Result Date: 04/24/2019 CLINICAL DATA:  Aspiration EXAM: PORTABLE CHEST 1 VIEW COMPARISON:  April 22, 2019 FINDINGS: There is a well-positioned tunneled dialysis catheter on the right. The lung volumes are slightly hyperexpanded. The heart size is stable but enlarged. There are small bilateral pleural effusions with adjacent airspace disease favored to represent atelectasis. There is no pneumothorax. No new large focal airspace opacity concerning for aspiration. IMPRESSION: Essentially stable appearance of the chest without evidence for aspiration. Electronically Signed   By: Constance Holster M.D.   On: 04/24/2019 14:57   DG CHEST PORT 1 VIEW  Result Date: 04/22/2019 CLINICAL DATA:  Cough EXAM: PORTABLE CHEST 1 VIEW COMPARISON:  04/19/2019 FINDINGS: Single frontal view of the chest demonstrates stable right internal jugular dialysis catheter. Cardiac silhouette is unchanged. Continued ectasia and atherosclerosis of the thoracic aorta. Diffuse interstitial prominence unchanged since prior exam. Decreased right pleural effusion. No pneumothorax. IMPRESSION: 1. Diffuse interstitial prominence consistent with chronic interstitial lung disease. 2. Decreased right pleural effusion. Electronically Signed   By: Randa Ngo M.D.   On: 04/22/2019 15:01   DG Chest Portable 1 View  Result Date: 04/19/2019 CLINICAL DATA:  Altered mental status EXAM: PORTABLE CHEST 1 VIEW COMPARISON:  April 15, 2019 FINDINGS: There is unchanged cardiomegaly, the patient is slightly  rotated. Aortic calcifications are seen. A right-sided central venous catheter with the tip just entering the right atrium. Again noted are mildly increased interstitial markings throughout both lungs, not significantly changed since the prior exam. There is also stable right basilar small pleural effusion. No acute osseous abnormality. IMPRESSION: Unchanged mildly increased interstitial markings throughout both lungs which could be due to chronic lung changes or mild interstitial edema. Trace right pleural effusion. Electronically Signed   By: Prudencio Pair M.D.   On: 04/19/2019 22:48   DG Chest Port 1 View  Result Date: 04/15/2019 CLINICAL DATA:  Possible aspiration. EXAM: PORTABLE CHEST 1 VIEW COMPARISON:  March 11, 2019 FINDINGS: The study is limited secondary to patient rotation. There is stable right-sided venous catheter positioning. Mild, chronic appearing increased lung markings are noted. This is seen on the prior study. Mild, stable left basilar atelectasis is present. There is no evidence of a pleural effusion or pneumothorax. The cardiac silhouette is mildly enlarged and unchanged in size. Degenerative changes seen throughout the thoracic spine. IMPRESSION: Stable cardiomegaly without evidence of active disease. Electronically Signed   By: Virgina Norfolk M.D.   On: 04/15/2019 23:48   DG Swallowing Func-Speech Pathology  Result Date: 04/25/2019 Objective Swallowing Evaluation: Type of Study: MBS-Modified Barium Swallow Study  Patient Details Name: Arelyn Gauer MRN: [id number] Date of Birth: 07-09-1945 Today's Date: 04/25/2019 Time: SLP Start Time (ACUTE ONLY): 1210 -SLP Stop Time (ACUTE ONLY): 1239 SLP Time Calculation (min) (ACUTE ONLY): 29 min Past Medical History: Past Medical History: Diagnosis Date . Arthritis  . COPD (chronic obstructive pulmonary disease) ([place])  . Diabetes mellitus without complication ([place])  . Diabetic neuropathy ([place])  . Oxygen dependent 03/30/2018  5L home O2 .  Renal disorder   ESRD . Thyroid disease  . TIA (transient ischemic attack)  Past Surgical History: Past Surgical History: Procedure Laterality Date . ABDOMINAL HYSTERECTOMY   . IR FLUORO GUIDE CV LINE RIGHT  11/14/2018 . RECTOCELE REPAIR   . REPLACEMENT TOTAL KNEE BILATERAL Bilateral 2008 . THYROIDECTOMY    80% . VAGINAL WOUND CLOSURE / REPAIR   HPI: Debara Kamphuis is an 74 y.o. female past medical history that includes COPD with chronic hypoxic respiratory failure, end-stage renal disease on dialysis Tuesday Thursday Saturday, hypothyroidism, insulin-dependent diabetes, chronic diastolic CHF, history of CVA, recent admission with acute on chronic respiratory failure secondary to Covid presented to the emergency department March 24 chief complaint increased confusion and headache.  Initial work-up in the emergency department included an MRI without contrast of the brain revealing an acute or subacute right temporal infarct. Prior admission notes include the following- 1/23 CT chest>>"no PE, near complete collapse of both the right middle and right lower lobes with extensive endobronchial debris. Consolidation involving the right upper lobe concerning for pneumonia or aspiration pneumonia." Speech consulted for swallowing given concerns for aspiration.  Pt is known to SLP services; has had swallow assessments during admissions in October and November.  MBS on 11/30 revealed mild pharyngeal dysphagia with frank penetration of thin liquids to the vocal folds; chin tuck assisted in airway protection.  F/U MBS recommended during [place] admission, but pt could not tolerate assessment, Discharged on Dys 3/nectar.  Subjective: cooperative Assessment / Plan / Recommendation [facility] IP CLINICAL IMPRESSIONS 04/25/2019 Clinical Impression Patient presents with mild oropharyngeal dysphagia. Oral phase  is remarkable for decreased bolus cohesion resulting in premature spillage over the epiglottis and oral residue. Pt required Mod cues  to initate multiple swallows, reducing oral residue. Pt has a very curved epiglottis, resulting in a narrow vallecular space. When premature spillage occurs, barium passes over the vallecula, and mistimed epiglottic inversion resulted in deep penetration to the vocal cords (PAS 4). A chin tuck was initially utilized to attempt to improve airway protection (per previous notes), however, the patient was observed to have better oral control when not using a chin tuck. This prevented premature spillage and penetration. Trace esophageal backflow was noted with all consistencies, and a minimal amount of barium was observed in the esophagus. Recommend mechanical soft diet (Dys 3), thin liquids, and meds crushed in puree. Ensure the pt is following general aspiration precautions, checking intermittently for oral residue.  SLP Visit Diagnosis Dysphagia, unspecified (R13.10) Attention and concentration deficit following -- Frontal lobe and executive function deficit following -- Impact on safety and function Mild aspiration risk   [facility] IP TREATMENT RECOMMENDATION 04/25/2019 Treatment Recommendations Therapy as outlined in treatment plan below   Prognosis 04/25/2019 Prognosis for Safe Diet Advancement Good Barriers to Reach Goals -- Barriers/Prognosis Comment -- [facility] IP DIET RECOMMENDATION 04/25/2019 SLP Diet Recommendations Dysphagia 3 (Mech soft) solids;Thin liquid Liquid Administration via Straw;Cup Medication Administration Crushed with puree Compensations Slow rate;Small sips/bites Postural Changes Seated upright at 90 degrees   [facility] IP OTHER RECOMMENDATIONS 04/25/2019 Recommended Consults Consider esophageal assessment Oral Care Recommendations Oral care BID Other Recommendations --   [facility] IP FOLLOW UP RECOMMENDATIONS 04/25/2019 Follow up Recommendations Skilled Nursing facility   [facility] IP FREQUENCY AND DURATION 04/25/2019 Speech Therapy Frequency (ACUTE ONLY) min 2x/week Treatment Duration 2 weeks      [facility] IP ORAL PHASE 04/25/2019  Oral Phase Impaired Oral - Pudding Teaspoon -- Oral - Pudding Cup -- Oral - Honey Teaspoon -- Oral - Honey Cup -- Oral - Nectar Teaspoon -- Oral - Nectar Cup -- Oral - Nectar Straw Lingual/palatal residue;Decreased bolus cohesion;Premature spillage Oral - Thin Teaspoon -- Oral - Thin Cup -- Oral - Thin Straw Lingual/palatal residue;Decreased bolus cohesion;Premature spillage Oral - Puree Lingual/palatal residue;Impaired mastication;Delayed oral transit Oral - Mech Soft -- Oral - Regular Impaired mastication;Delayed oral transit;Lingual/palatal residue Oral - Multi-Consistency -- Oral - Pill -- Oral Phase - Comment --  [facility] IP PHARYNGEAL PHASE 04/25/2019 Pharyngeal Phase Impaired Pharyngeal- Pudding Teaspoon -- Pharyngeal -- Pharyngeal- Pudding Cup -- Pharyngeal -- Pharyngeal- Honey Teaspoon -- Pharyngeal -- Pharyngeal- Honey Cup -- Pharyngeal -- Pharyngeal- Nectar Teaspoon -- Pharyngeal -- Pharyngeal- Nectar Cup -- Pharyngeal -- Pharyngeal- Nectar Straw Penetration/Aspiration during swallow;Reduced epiglottic inversion;Compensatory strategies attempted (with notebox) Pharyngeal Material enters airway, CONTACTS cords and then ejected out Pharyngeal- Thin Teaspoon -- Pharyngeal -- Pharyngeal- Thin Cup -- Pharyngeal -- Pharyngeal- Thin Straw Penetration/Aspiration during swallow;Reduced epiglottic inversion;Compensatory strategies attempted (with notebox) Pharyngeal Material enters airway, CONTACTS cords and then ejected out Pharyngeal- Puree WFL Pharyngeal -- Pharyngeal- Mechanical Soft -- Pharyngeal -- Pharyngeal- Regular WFL Pharyngeal -- Pharyngeal- Multi-consistency -- Pharyngeal -- Pharyngeal- Pill -- Pharyngeal -- Pharyngeal Comment --  [facility] IP CERVICAL ESOPHAGEAL PHASE 04/25/2019 Cervical Esophageal Phase Impaired Pudding Teaspoon -- Pudding Cup -- Honey Teaspoon -- Honey Cup -- Nectar Teaspoon -- Nectar Cup -- Nectar Straw Esophageal backflow into the pharynx Thin Teaspoon -- Thin Cup -- Thin Straw Esophageal  backflow into the pharynx Puree Esophageal backflow into the pharynx Mechanical Soft -- Regular Esophageal backflow into the larynx Multi-consistency -- Pill --  Cervical Esophageal Comment -- Osie Bond., M.A. [facility] Pager [phone] Office [phone] 04/25/2019, 3:51 PM              ECHOCARDIOGRAM COMPLETE  Result Date: 04/21/2019    ECHOCARDIOGRAM REPORT   Patient Name:   EMORIE MCFATE Date of Exam: 04/21/2019 Medical Rec #:  [id number]      Height:       61.0 in Accession #:    [id number]     Weight:       188.3 lb Date of Birth:  Nov 19, 1945      BSA:          1.841 m Patient Age:    36 years       BP:           128/82 mmHg Patient Gender: F              HR:           108 bpm. Exam Location:  Inpatient Procedure: 2D Echo, Cardiac Doppler and Color Doppler Indications:    Stroke 434.91 / I163.9  History:        Patient has prior history of Echocardiogram examinations, most                 recent 09/16/2018. TIA and COPD; Risk Factors:Diabetes. Thyroid                 disease.  Sonographer:    Jonelle Sidle Dance Referring Phys: [id number] KAREN M BLACK  Sonographer Comments: Technically difficult study due to poor echo windows and suboptimal parasternal window. IMPRESSIONS  1. Left ventricular ejection fraction, by estimation, is 60 to 65%. The left ventricle has normal function. Left ventricular endocardial border not optimally defined to evaluate regional wall motion. Indeterminate diastolic filling due to E-A fusion.  2. Right ventricular systolic function is normal. The right ventricular size is normal. Tricuspid regurgitation signal is inadequate for assessing PA pressure.  3. The mitral valve is degenerative. No evidence of mitral valve regurgitation. No evidence of mitral stenosis.  4. The aortic valve is grossly normal. Aortic valve regurgitation is not visualized. No aortic stenosis is present.  5. The inferior vena cava is normal in size with greater than 50% respiratory  variability, suggesting right atrial pressure of 3 mmHg. Conclusion(s)/Recommendation(s): Very poor windows for TTE. Globally normal LV function but unable to exclude RWMA. FINDINGS  Left Ventricle: Left ventricular ejection fraction, by estimation, is 60 to 65%. The left ventricle has normal function. Left ventricular endocardial border not optimally defined to evaluate regional wall motion. The left ventricular internal cavity size was normal in size. There is no left ventricular hypertrophy. Indeterminate diastolic filling due to E-A fusion. Right Ventricle: The right ventricular size is normal. No increase in right ventricular wall thickness. Right ventricular systolic function is normal. Tricuspid regurgitation signal is inadequate for assessing PA pressure. Left Atrium: Left atrial size was normal in size. Right Atrium: Right atrial size was normal in size. Pericardium: A small pericardial effusion is present. The pericardial effusion is anterior to the right ventricle. Mitral Valve: The mitral valve is degenerative in appearance. There is mild thickening of the mitral valve leaflet(s). There is mild calcification of the mitral valve leaflet(s). Mild to moderate mitral annular calcification. No evidence of mitral valve regurgitation. No evidence of mitral valve stenosis. Tricuspid Valve: The tricuspid valve is grossly normal. Tricuspid valve regurgitation is trivial. No evidence of tricuspid stenosis. Aortic Valve: The aortic valve is  grossly normal. Aortic valve regurgitation is not visualized. No aortic stenosis is present. Pulmonic Valve: The pulmonic valve was grossly normal. Pulmonic valve regurgitation is not visualized. Aorta: The aortic root is normal in size and structure. Venous: The inferior vena cava is normal in size with greater than 50% respiratory variability, suggesting right atrial pressure of 3 mmHg. IAS/Shunts: The atrial septum is grossly normal.  LEFT VENTRICLE PLAX 2D LVIDd:         4.00  cm LVIDs:         3.40 cm LV PW:         1.00 cm LV IVS:        1.10 cm LVOT diam:     1.90 cm LV SV:         36 LV SV Index:   20 LVOT Area:     2.84 cm  RIGHT VENTRICLE            IVC RV Basal diam:  2.30 cm    IVC diam: 1.55 cm RV S prime:     9.90 cm/s TAPSE (M-mode): 1.3 cm LEFT ATRIUM             Index       RIGHT ATRIUM          Index LA diam:        3.50 cm 1.90 cm/m  RA Area:     8.22 cm LA Vol (A2C):   40.2 ml 21.84 ml/m RA Volume:   14.50 ml 7.88 ml/m LA Vol (A4C):   41.7 ml 22.65 ml/m LA Biplane Vol: 42.7 ml 23.20 ml/m  AORTIC VALVE LVOT Vmax:   78.70 cm/s LVOT Vmean:  47.650 cm/s LVOT VTI:    0.127 m  AORTA Ao Root diam: 2.70 cm MITRAL VALVE MV Area (PHT): 5.46 cm     SHUNTS MV Decel Time: 139 msec     Systemic VTI:  0.13 m MV E velocity: 106.00 cm/s  Systemic Diam: 1.90 cm Eleonore Chiquito MD Electronically signed by Eleonore Chiquito MD Signature Date/Time: 04/21/2019/12:31:36 PM    Final     Assessment/Plan  1. Advance care planning - currently full code, son [name] verbalized that resident wants to continue full code status and declined palliative care for now -Discussed medications, vital signs and weight  2. Acute on chronic anemia Lab Results  Component Value Date   HGB 13.0 05/08/2019   - presented to the hospital with hgb 4.7 with prior hgb 10.1 (04/22/2019) - S/P transfusion of 3 units PRBC - will re-check CBC in 1 week  3. ESRD on dialysis ([place]) -On hemodialysis TThSat -Continue Sevelamer 800 mg 1 tab daily with snacks    4. Chronic obstructive pulmonary disease, unspecified COPD type ([place]) - on O2 at 4L/min via [place] continuously -Continue benzonatate 200 mg 1 capsule twice a day PRN ending on 05/22/2019, Mucinex ER 600 mg 1 tab twice a day and levalbuterol HFA PRN  5. Chronic respiratory failure with hypoxia ([facility]) -Continue O2 via 4 L/minute via [place] continuously -Has been refusing BiPAP at night, supposed to be on BiPAP at night and as needed during daytime for  drowsiness -BiPAP settings at IPAP 18 EPAP 5, flow rate 5  6. Acquired hypothyroidism Lab Results  Component Value Date   TSH 6.920 (H) 05/06/2019   -Continue levothyroxine 112 mcg 1 tab daily    Family/ staff Communication: Discussed plan of care with IDT.  Labs/tests ordered: CBC in 1 week  Goals  of care:   Short-term care   Durenda Age, DNP, FNP-BC [facility] [phone] (Monday-Friday 8:00 a.m. - 5:00 p.m.) [phone] (after hours)

## 2019-05-12 LAB — BASIC METABOLIC PANEL
BUN: 23 — AB (ref 4–21)
CO2: 30 — AB (ref 13–22)
Chloride: 95 — AB (ref 99–108)
Creatinine: 4.5 — AB (ref 0.5–1.1)
Glucose: 122
Potassium: 3.8 (ref 3.4–5.3)
Sodium: 139 (ref 137–147)

## 2019-05-12 LAB — CBC AND DIFFERENTIAL
HCT: 42 (ref 36–46)
Hemoglobin: 13.2 (ref 12.0–16.0)
Neutrophils Absolute: 7
Platelets: 134 — AB (ref 150–399)
WBC: 8.5

## 2019-05-12 LAB — COMPREHENSIVE METABOLIC PANEL
Calcium: 9.9 (ref 8.7–10.7)
GFR calc Af Amer: 10
GFR calc non Af Amer: 9

## 2019-05-12 LAB — CBC: RBC: 4.68 (ref 3.87–5.11)

## 2019-05-15 ENCOUNTER — Telehealth: Payer: Self-pay | Admitting: Nurse Practitioner

## 2019-05-15 LAB — CBC AND DIFFERENTIAL
HCT: 36 (ref 36–46)
Hemoglobin: 11.5 — AB (ref 12.0–16.0)
Neutrophils Absolute: 5
Platelets: 157 (ref 150–399)
WBC: 8

## 2019-05-15 LAB — CBC: RBC: 4 (ref 3.87–5.11)

## 2019-05-15 NOTE — Telephone Encounter (Signed)
[name] please advise pt.  Pt's daughter called and was asking about the referral for the ambulance to take pt to dialysis on Tuesdays, Thursdays, and Saturday. Pt will need ambulance this coming Thursday for dialysis.  Pt stated that the rehab place told them they have to use their services. Pt stated last time in office if they had any trouble to contact you about it.

## 2019-05-15 NOTE — Telephone Encounter (Signed)
Patient is calling and wanted to speak to someone getting a referral. CB is [phone]

## 2019-05-16 ENCOUNTER — Encounter: Payer: Self-pay | Admitting: Adult Health

## 2019-05-16 ENCOUNTER — Non-Acute Institutional Stay (SKILLED_NURSING_FACILITY): Payer: Medicare Other | Admitting: Adult Health

## 2019-05-16 DIAGNOSIS — R29818 Other symptoms and signs involving the nervous system: Secondary | ICD-10-CM

## 2019-05-16 DIAGNOSIS — I953 Hypotension of hemodialysis: Secondary | ICD-10-CM

## 2019-05-16 DIAGNOSIS — R5381 Other malaise: Secondary | ICD-10-CM

## 2019-05-16 DIAGNOSIS — R4189 Other symptoms and signs involving cognitive functions and awareness: Secondary | ICD-10-CM

## 2019-05-16 DIAGNOSIS — F339 Major depressive disorder, recurrent, unspecified: Secondary | ICD-10-CM

## 2019-05-16 DIAGNOSIS — B370 Candidal stomatitis: Secondary | ICD-10-CM

## 2019-05-16 DIAGNOSIS — N186 End stage renal disease: Secondary | ICD-10-CM | POA: Diagnosis not present

## 2019-05-16 DIAGNOSIS — E039 Hypothyroidism, unspecified: Secondary | ICD-10-CM

## 2019-05-16 DIAGNOSIS — J410 Simple chronic bronchitis: Secondary | ICD-10-CM | POA: Diagnosis not present

## 2019-05-16 DIAGNOSIS — Z8673 Personal history of transient ischemic attack (TIA), and cerebral infarction without residual deficits: Secondary | ICD-10-CM

## 2019-05-16 DIAGNOSIS — D649 Anemia, unspecified: Secondary | ICD-10-CM | POA: Diagnosis not present

## 2019-05-16 DIAGNOSIS — Z992 Dependence on renal dialysis: Secondary | ICD-10-CM

## 2019-05-16 MED ORDER — ATORVASTATIN CALCIUM 40 MG PO TABS: 40.0000 mg | ORAL_TABLET | Freq: Every day | ORAL | 0 refills | Status: DC

## 2019-05-16 MED ORDER — NYSTATIN 100000 UNIT/GM EX POWD: 1.0000 "application " | Freq: Two times a day (BID) | CUTANEOUS | 0 refills | Status: AC | PRN

## 2019-05-16 MED ORDER — MIDODRINE HCL 10 MG PO TABS: 10.0000 mg | ORAL_TABLET | ORAL | 0 refills | Status: DC

## 2019-05-16 MED ORDER — LEVOTHYROXINE SODIUM 112 MCG PO TABS: 112.0000 ug | ORAL_TABLET | Freq: Every day | ORAL | 0 refills | Status: DC

## 2019-05-16 MED ORDER — FLUOXETINE HCL 10 MG PO TABS: 10.0000 mg | ORAL_TABLET | Freq: Every day | ORAL | 0 refills | Status: DC

## 2019-05-16 MED ORDER — SEVELAMER CARBONATE 800 MG PO TABS: 800.0000 mg | ORAL_TABLET | Freq: Three times a day (TID) | ORAL | 0 refills | Status: AC

## 2019-05-16 MED ORDER — MONTELUKAST SODIUM 10 MG PO TABS: 10.0000 mg | ORAL_TABLET | Freq: Every day | ORAL | 0 refills | Status: DC

## 2019-05-16 MED ORDER — LEVALBUTEROL TARTRATE 45 MCG/ACT IN AERO: 2.0000 | INHALATION_SPRAY | RESPIRATORY_TRACT | 0 refills | Status: AC | PRN

## 2019-05-16 MED ORDER — GUAIFENESIN ER 600 MG PO TB12: 600.0000 mg | ORAL_TABLET | Freq: Two times a day (BID) | ORAL | Status: AC

## 2019-05-16 MED ORDER — NYSTATIN 100000 UNIT/ML MT SUSP: 5.0000 mL | Freq: Four times a day (QID) | OROMUCOSAL | 0 refills | Status: DC

## 2019-05-16 MED ORDER — LIDOCAINE 4 % EX PTCH: 1.0000 | MEDICATED_PATCH | Freq: Every day | CUTANEOUS | 0 refills | Status: DC | PRN

## 2019-05-16 NOTE — Progress Notes (Addendum)
Location:  [facility] Room Number: 317-A Place of Service:  SNF (31) Provider:  Durenda Age, DNP, FNP-BC  Patient Care Team: Nche, Charlene Brooke, NP as PCP - General (Internal Medicine)  Extended Emergency Contact Information Primary Emergency Contact: [facility] Phone: [phone] Mobile Phone: [phone] Relation: Son Secondary Emergency Contact: [facility] Phone: [phone] Mobile Phone: [phone] Relation: Daughter  Code Status:  Full Code  Goals of care: Advanced Directive information Advanced Directives 05/09/2019  Does Patient Have a Medical Advance Directive? Yes  Type of Advance Directive (No Data)  Does patient want to make changes to medical advance directive? No - Patient declined  Copy of [place] in Chart? -  Would patient like information on creating a medical advance directive? -     Chief Complaint  Patient presents with  . Discharge Note    Patient is seen for discharge from SNF.     HPI:  Pt is a 74 y.o. female who is for discharge home with Home health PT, OT and Nurse for medication management.  She was re-admitted to [facility] on 05/08/19 post hospitalization 05/04/19 to 05/08/19 for acute on chronic anemia of chronic disease. She presented to the hospital with hemoglobin of 4.7 with previous hemoglobin of 10.1 on 04/22/19. Stool guaiac was negative and no overt signs of bleeding. She was transfused 3 units PRBC. She was discharged from the hospital with hgb of 13.  Of note, before hospitalization, she was having a short-term rehabilitation at [facility] post hospitalization 04/19/19 to 04/25/19 for stroke. CT of the head did not show any acute stroke. MRI of the brain showed small acute to subacute right temporal infarct. Moderate to advanced chronic microvascular ischemic changes. MRA of the head and neck showed mild atheromatous  irregularity about the carotid bifurcations without hemodynamically significant stenosis. Echocardiogram showed left ventricular ejection fraction, by estimation, is 60 to 65%.  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  Patient was admitted to this facility for short-term rehabilitation after the patient's recent hospitalization.  Patient has completed SNF rehabilitation and therapy has cleared the patient for discharge.   Past Medical History:  Diagnosis Date  . Arthritis   . COPD (chronic obstructive pulmonary disease) ([place])   . Diabetic neuropathy ([place])   . 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 Renal disorder    ESRD  . Thyroid disease   . TIA (transient ischemic attack)   . Type 2 diabetes mellitus with peripheral neuropathy [facility])    Past Surgical History:  Procedure Laterality Date  . ABDOMINAL HYSTERECTOMY    . IR FLUORO GUIDE CV LINE RIGHT  11/14/2018  . RECTOCELE REPAIR    . REPLACEMENT TOTAL KNEE BILATERAL Bilateral 2008  . THYROIDECTOMY     80%  . VAGINAL WOUND CLOSURE / REPAIR      Allergies  Allergen Reactions  . Avelox [Moxifloxacin Hcl In Nacl] Other (See Comments)    Unknown reaction  . Codeine Anaphylaxis  . Penicillins Rash    Did it involve swelling of the face/tongue/throat, SOB, or low BP? Unk Did it involve sudden or severe rash/hives, skin peeling, or any reaction on the inside of your mouth or nose? Yes Did you need to seek medical attention at a hospital or doctor's office? Unk When did it last happen? Unk If all above answers are "NO", may proceed with cephalosporin  use.   . Sulfa Antibiotics Other (See Comments)    Unknown reaction  . Dextromethorphan-Guaifenesin Other (See Comments)    Reported by [facility] - unknown reaction. Pt states she is not allergic to dextromethorphan-guaifenesin.  . Shellfish Allergy Hives, Itching and Rash    Outpatient Encounter  Medications as of 05/16/2019  Medication Sig  . acetaminophen (TYLENOL) 325 MG tablet Take 650 mg by mouth every 6 (six) hours as needed for mild pain (for arthritic pain).   Marland Kitchen ascorbic acid (VITAMIN C) 500 MG tablet Take 1 tablet (500 mg total) by mouth daily.  Marland Kitchen b complex-vitamin c-folic acid (NEPHRO-VITE) 0.8 MG TABS tablet Take 1 tablet by mouth at bedtime.  . benzonatate (TESSALON) 200 MG capsule Take 1 capsule (200 mg total) by mouth 2 (two) times daily as needed for cough.  . Darbepoetin Alfa (ARANESP) 25 MCG/0.42ML SOSY injection Inject 0.42 mLs (25 mcg total) into the vein every Tuesday with hemodialysis.  Marland Kitchen docusate sodium (COLACE) 100 MG capsule Take 100 mg by mouth every morning.  Marland Kitchen doxercalciferol (HECTOROL) 4 MCG/2ML injection Inject 1.5 mLs (3 mcg total) into the vein Every Tuesday,Thursday,and Saturday with dialysis.  Marland Kitchen FLUoxetine (PROZAC) 10 MG tablet Take 1 tablet (10 mg total) by mouth daily.  Marland Kitchen guaiFENesin (MUCINEX) 600 MG 12 hr tablet Take 1 tablet (600 mg total) by mouth 2 (two) times daily for 60 doses.  Marland Kitchen levalbuterol (XOPENEX HFA) 45 MCG/ACT inhaler Inhale 2 puffs into the lungs every 4 (four) hours as needed for wheezing.  Marland Kitchen levothyroxine (SYNTHROID) 112 MCG tablet Take 1 tablet (112 mcg total) by mouth daily before breakfast. 1 AM  . Lidocaine (ASPERCREME LIDOCAINE) 4 % PTCH Apply 1 patch topically daily as needed (Pain).  . Lubricants (K-Y LUBRICANT JELLY SENSITIVE EX) Place 1 application into both nostrils as needed (as directed- for lubrication).   . midodrine (PROAMATINE) 10 MG tablet Take 1 tablet (10 mg total) by mouth See admin instructions. Take 10 mg by mouth on Tuesday, Thursday, Saturday 45 minutes before dialysis  . montelukast (SINGULAIR) 10 MG tablet Take 1 tablet (10 mg total) by mouth at bedtime.  . NON FORMULARY Renal Diet Mechanical soft Nectar thick liquids  . NON FORMULARY Place into the nose at bedtime. BiPAP IPAP 18, EPAP 5, flowrate 5.  .  Nutritional Supplements (FEEDING SUPPLEMENT, NEPRO CARB STEADY,) LIQD Take 237 mLs by mouth 2 (two) times daily between meals.  . nystatin (MYCOSTATIN/NYSTOP) powder Apply 1 application topically 2 (two) times daily as needed (as directed- to any rashes).  . OXYGEN Inhale 4 L into the lungs continuous.  Marland Kitchen oxymetazoline (AFRIN) 0.05 % nasal spray Place 1 spray into both nostrils 2 (two) times daily as needed for congestion.  . predniSONE (DELTASONE) 20 MG tablet Take 20 mg by mouth daily with breakfast.  . senna (SENOKOT) 8.6 MG TABS tablet Take 1 tablet by mouth every morning.   . sevelamer carbonate (RENVELA) 800 MG tablet Take 1 tablet (800 mg total) by mouth 3 (three) times daily with meals.  Marland Kitchen SIMPLY SALINE NA Place 2 sprays into the nose as needed (congestion).   . [DISCONTINUED] FLUoxetine (PROZAC) 10 MG tablet Take 1 tablet (10 mg total) by mouth daily.  . [DISCONTINUED] guaiFENesin (MUCINEX) 600 MG 12 hr tablet Take 1 tablet (600 mg total) by mouth 2 (two) times daily.  . [DISCONTINUED] levalbuterol (XOPENEX HFA) 45 MCG/ACT inhaler Inhale 2 puffs into the lungs every 4 (four) hours as needed for wheezing.   . [  DISCONTINUED] levothyroxine (SYNTHROID) 112 MCG tablet Take 1 tablet (112 mcg total) by mouth daily before breakfast. 1 AM  . [DISCONTINUED] Lidocaine (ASPERCREME LIDOCAINE) 4 % PTCH Apply 1 patch topically daily as needed (Pain).  . [DISCONTINUED] midodrine (PROAMATINE) 10 MG tablet Take 10 mg by mouth See admin instructions. Take 10 mg by mouth on Tuesday, Thursday, Saturday 45 minutes before dialysis  . [DISCONTINUED] montelukast (SINGULAIR) 10 MG tablet Take 10 mg by mouth at bedtime.   . [DISCONTINUED] nystatin (MYCOSTATIN/NYSTOP) powder Apply 1 g topically 2 (two) times daily as needed (as directed- to any rashes).   . [DISCONTINUED] sevelamer carbonate (RENVELA) 800 MG tablet Take 800 mg by mouth 3 (three) times daily with meals.  Marland Kitchen atorvastatin (LIPITOR) 40 MG tablet Take 1  tablet (40 mg total) by mouth daily.  Marland Kitchen nystatin (MYCOSTATIN) 100000 UNIT/ML suspension Take 5 mLs (500,000 Units total) by mouth 4 (four) times daily for 14 days. Swish and spit  . [DISCONTINUED] heparin 5000 UNIT/ML injection Inject 1.5 mLs (7,500 Units total) into the skin every 8 (eight) hours.  . [DISCONTINUED] sevelamer carbonate (RENVELA) 800 MG tablet Take 1 tablet (800 mg total) by mouth daily. With a snack   No facility-administered encounter medications on file as of 05/16/2019.    Review of Systems  GENERAL: No change in appetite, no fatigue, no weight changes, no fever, chills or weakness MOUTH and THROAT: Denies oral discomfort, gingival pain or bleeding RESPIRATORY: no cough, SOB, DOE, wheezing, hemoptysis CARDIAC: No chest pain, edema or palpitations GI: No abdominal pain, diarrhea, constipation, heart burn, nausea or vomiting GU: Denies dysuria, frequency, hematuria or discharge NEUROLOGICAL: Denies dizziness, syncope, numbness, or headache PSYCHIATRIC: Denies feelings of depression or anxiety. No report of hallucinations, insomnia, paranoia, or agitation   Immunization History  Administered Date(s) Administered  . Hepatitis B, adult 07/27/2017, 09/30/2017, 02/01/2018  . Influenza, High Dose Seasonal PF 10/25/2018  . Influenza-Unspecified 11/25/2018   Pertinent  Health Maintenance Due  Topic Date Due  . FOOT EXAM  Never done  . OPHTHALMOLOGY EXAM  Never done  . COLONOSCOPY  Never done  . DEXA SCAN  Never done  . PNA vac Low Risk Adult (1 of 2 - PCV13) Never done  . URINE MICROALBUMIN  07/01/2019  . INFLUENZA VACCINE  08/27/2019  . HEMOGLOBIN A1C  10/28/2019   Fall Risk  07/01/2018  Falls in the past year? 0     Vitals:   05/16/19 1007  BP: 132/78  Pulse: 69  Resp: 18  Temp: 97.7 F (36.5 C)  TempSrc: Oral  Weight: 185 lb 14.4 oz (84.3 kg)  Height: 5\' 1"  (1.549 m)   Body mass index is 35.13 kg/m.  Physical Exam  GENERAL APPEARANCE: Well  nourished. In no acute distress. Obese SKIN:  Skin is warm and dry.  MOUTH and THROAT: Lips are without lesions. Oral mucosa is moist and without lesions. Tongue noted to have mild  RESPIRATORY: Breathing is even & unlabored, BS CTAB CARDIAC: RRR, no murmur,no extra heart sounds, no edema, right chest dialysis catheter GI: Abdomen soft, normal BS, no masses, no tenderness EXTREMITIES:  Able to move X 4 extremities, BLE  generalized weakness NEUROLOGICAL: There is no tremor. Speech is clear. Alert to self, disoriented to time and place.  PSYCHIATRIC:  Affect and behavior are appropriate  Labs reviewed: Recent Labs    12/25/18 0045 12/25/18 0045 12/26/18 0927 12/27/18 0400 01/05/19 1800 04/25/19 0901 04/25/19 0901 04/26/19 0332 05/03/19 0000 05/05/19 0727 05/05/19  7741 05/06/19 2878 05/06/19 6767 05/07/19 0411 05/08/19 0414 05/12/19 0000  NA 136   < >  --  132*   < > 138   < > 137   < > 136   < > 136   < > 137 137 139  K 3.9   < >  --  4.8   < > 4.5   < > 3.4*   < > 3.6   < > 3.8   < > 3.7 4.1 3.8  CL 93*   < >  --  94*   < > 96*   < > 95*   < > 93*   < > 93*   < > 97* 96* 95*  CO2 21*   < >  --  18*   < > 25   < > 23   < > 25   < > 25   < > 24 23 30*  GLUCOSE 150*   < >  --  92   < > 112*   < > 144*   < > 82   < > 91  --  115* 110*  --   BUN 18   < >  --  49*   < > 32*   < > 9   < > 15   < > 24*   < > 22 38* 23*  CREATININE 4.07*   < >  --  6.82*   < > 6.44*   < > 3.21*   < > 3.87*   < > 5.17*   < > 5.17* 6.95* 4.5*  CALCIUM 8.3*   < >  --  8.7*   < > 9.8   < > 9.5   < > 9.6   < > 9.6   < > 9.9 9.6 9.9  MG 2.2  --  2.3 2.3  --   --   --   --   --   --   --   --   --   --   --   --   PHOS 6.0*   < >  --  7.2*   < > 4.5  --  2.7  --  7.3*  --   --   --   --   --   --    < > = values in this interval not displayed.   Recent Labs    05/04/19 1621 05/05/19 0727 05/06/19 0312  AST 17 14* 13*  ALT 11 13 11   ALKPHOS 104 77 84  BILITOT 1.3* 1.1 1.5*  PROT 6.6 5.6* 5.8*    ALBUMIN 3.1* 2.7* 2.8*   Recent Labs    05/06/19 0312 05/06/19 0312 05/07/19 0411 05/07/19 0411 05/08/19 0414 05/12/19 0000 05/15/19 0000  WBC 9.3   < > 9.1   < > 9.7 8.5 8.0  NEUTROABS 6.1   < > 5.9   < > 6.1 7 5   HGB 13.2   < > 13.8   < > 13.0 13.2 11.5*  HCT 42.5   < > 44.5   < > 41.9 42 36  MCV 91.4  --  94.3  --  93.9  --   --   PLT 159   < > 148*   < > 165 134* 157   < > = values in this interval not displayed.   Lab Results  Component Value Date   TSH 6.920 (H) 05/06/2019   Lab Results  Component Value Date   HGBA1C 5.5 04/28/2019   Lab Results  Component Value Date   CHOL 192 04/20/2019   HDL 53 04/20/2019   LDLCALC 90 04/20/2019   TRIG 244 (H) 04/20/2019   CHOLHDL 3.6 04/20/2019    Significant Diagnostic Results in last 30 days:  CT Head Wo Contrast  Result Date: 04/19/2019 CLINICAL DATA:  Encephalopathy, confusion, diabetes EXAM: CT HEAD WITHOUT CONTRAST TECHNIQUE: Contiguous axial images were obtained from the base of the skull through the vertex without intravenous contrast. COMPARISON:  04/15/2019 FINDINGS: Brain: Chronic small-vessel ischemic changes throughout the periventricular white matter and central pons. No acute infarct or hemorrhage. Lateral ventricles and midline structures are unremarkable. Stable diffuse cerebral atrophy. No acute extra-axial fluid collections. Stable bilateral subdural hygromas along the convexities. No mass effect. Vascular: No hyperdense vessel or unexpected calcification. Skull: Normal. Negative for fracture or focal lesion. Sinuses/Orbits: No acute finding. Other: None IMPRESSION: 1. Stable exam, no acute process. Electronically Signed   By: Randa Ngo M.D.   On: 04/19/2019 23:25   MR ANGIO NECK WO CONTRAST  Result Date: 04/21/2019 CLINICAL DATA:  Follow-up examination for acute stroke. EXAM: MRA NECK WITHOUT  CONTRAST TECHNIQUE: Multiplanar and multiecho pulse sequences of the neck were obtained without intravenous  contrast. Angiographic images of the neck were obtained using MRA technique without and with intravenous contrast. COMPARISON:  Prior MRI from 04/20/2019. FINDINGS: AORTIC ARCH: Examination somewhat technically limited by lack of IV contrast. Aortic arch and origin of the great vessels not assessed on this examination. Partially visualized subclavian arteries are widely patent. RIGHT CAROTID SYSTEM: Right common carotid artery patent from its origin to the bifurcation without stenosis. Mild atheromatous irregularity about the right bifurcation without hemodynamically significant stenosis. Right ICA widely patent to the skull base without stenosis, occlusion, or obvious dissection. LEFT CAROTID SYSTEM: Visualized left common carotid artery widely patent to the bifurcation. Mild atheromatous irregularity about the left bifurcation without definite flow-limiting stenosis. Left ICA patent distally to the skull base without stenosis, occlusion, or definite dissection. VERTEBRAL ARTERIES: Origin of the left vertebral artery not seen. Visualized vertebral arteries widely patent within the neck with antegrade flow, with no appreciable stenosis or other abnormality. Vertebral arteries are largely codominant. IMPRESSION: 1. Mild atheromatous irregularity about the carotid bifurcations without hemodynamically significant stenosis. Otherwise wide patency of both carotid artery systems within the neck. 2. Wide patency of both vertebral arteries within the neck. Electronically Signed   By: Jeannine Boga M.D.   On: 04/21/2019 01:05   MR BRAIN WO CONTRAST  Result Date: 05/04/2019 CLINICAL DATA:  74 year old female with recent encephalopathy, ataxia. 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 EXAM: MRI HEAD WITHOUT CONTRAST TECHNIQUE: Multiplanar, multiecho pulse sequences of the brain and surrounding structures were obtained without intravenous contrast. COMPARISON:  Brain MRI 04/20/2019. FINDINGS:  Brain: The tiny area of posterior right temporal lobe white matter diffusion abnormality has faded since last month. No new restricted diffusion to suggest acute infarction. No midline shift, mass effect, evidence of mass lesion, ventriculomegaly, extra-axial collection or acute intracranial hemorrhage. Cervicomedullary junction and pituitary are within normal limits. Stable gray and white matter signal throughout the brain. Widespread bilateral cerebral white matter and deep gray nuclei T2 and FLAIR hyperintensity. Moderate similar T2 and FLAIR heterogeneity in the pons. No cortical encephalomalacia or chronic cerebral blood products identified. Cerebellum remains within normal limits. Vascular: Major intracranial vascular flow voids are stable. Skull and upper cervical spine: Negative  for age visible cervical spine. Bone marrow signal is stable and within normal limits. Sinuses/Orbits: Stable, negative. Other: Persistent bilateral mastoid effusions. Negative nasopharynx. Other visible internal auditory structures appear grossly normal. Negative scalp and face soft tissues. IMPRESSION: 1. No acute intracranial abnormality. 2. 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 3. Otherwise stable brain with advanced signal changes most compatible with chronic small vessel disease. 4. Persistent mastoid effusions, most commonly postinflammatory. Electronically Signed   By: Genevie Ann M.D.   On: 05/04/2019 17:42   MR BRAIN WO CONTRAST  Result Date: 04/20/2019 CLINICAL DATA:  Encephalopathy EXAM: MRI HEAD WITHOUT CONTRAST TECHNIQUE: Multiplanar, multiecho pulse sequences of the brain and surrounding structures were obtained without intravenous contrast. COMPARISON:  None. FINDINGS: Brain: There a small focus mildly reduced diffusion in the right temporal lobe. No hemorrhage. There is no intracranial mass, mass effect, or edema. Patchy and confluent areas of T2 hyperintensity in the  supratentorial and pontine white matter are nonspecific but probably reflect moderate to advanced chronic microvascular ischemic changes. There is bilateral cerebral convexity dural thickening or minimal residual chronic subdural collections. Prominence of the ventricles and sulci reflects mild generalized parenchymal volume loss. There is no hydrocephalus or extra-axial fluid collection. Vascular: Major vessel flow voids at the skull base are preserved. Skull and upper cervical spine: Normal marrow signal is preserved. Sinuses/Orbits: Paranasal sinuses are aerated. Orbits are unremarkable. Other: Sella is unremarkable.  Nonspecific mastoid effusions. IMPRESSION: Small acute to subacute right temporal infarct. Moderate to advanced chronic microvascular ischemic changes. Dural thickening versus minimal residual chronic subdural collections. Nonspecific mastoid effusions. Electronically Signed   By: Macy Mis M.D.   On: 04/20/2019 10:06   DG Chest Port 1 View  Result Date: 05/04/2019 CLINICAL DATA:  Altered mental status EXAM: PORTABLE CHEST 1 VIEW COMPARISON:  04/24/2019 FINDINGS: Right-sided central line tip in the proximal right atrium as seen previously. Patient rotated towards the right. Aortic atherosclerosis. Left lung shows resolution of atelectasis previously seen at the base. Widespread tiny reticular nodules remain visible, raising possibility of atypical pneumonia. Chronic volume loss persists at the right base. Fine reticular nodular opacities also visible on this side. IMPRESSION: Chronic volume loss at the right base. Improved aeration at the left base. Tiny reticulo nodular shadows throughout both lungs raising the possibility of atypical pneumonia. Electronically Signed   By: Nelson Chimes M.D.   On: 05/04/2019 16:51   DG CHEST PORT 1 VIEW  Result Date: 04/24/2019 CLINICAL DATA:  Aspiration EXAM: PORTABLE CHEST 1 VIEW COMPARISON:  April 22, 2019 FINDINGS: There is a well-positioned tunneled  dialysis catheter on the right. The lung volumes are slightly hyperexpanded. The heart size is stable but enlarged. There are small bilateral pleural effusions with adjacent airspace disease favored to represent atelectasis. There is no pneumothorax. No new large focal airspace opacity concerning for aspiration. IMPRESSION: Essentially stable appearance of the chest without evidence for aspiration. Electronically Signed   By: Constance Holster M.D.   On: 04/24/2019 14:57   DG CHEST PORT 1 VIEW  Result Date: 04/22/2019 CLINICAL DATA:  Cough EXAM: PORTABLE CHEST 1 VIEW COMPARISON:  04/19/2019 FINDINGS: Single frontal view of the chest demonstrates stable right internal jugular dialysis catheter. Cardiac silhouette is unchanged. Continued ectasia and atherosclerosis of the thoracic aorta. Diffuse interstitial prominence unchanged since prior exam. Decreased right pleural effusion. No pneumothorax. IMPRESSION: 1. Diffuse interstitial prominence consistent with chronic interstitial lung disease. 2. Decreased right pleural effusion. Electronically Signed  By: Randa Ngo M.D.   On: 04/22/2019 15:01   DG Chest Portable 1 View  Result Date: 04/19/2019 CLINICAL DATA:  Altered mental status EXAM: PORTABLE CHEST 1 VIEW COMPARISON:  April 15, 2019 FINDINGS: There is unchanged cardiomegaly, the patient is slightly rotated. Aortic calcifications are seen. A right-sided central venous catheter with the tip just entering the right atrium. Again noted are mildly increased interstitial markings throughout both lungs, not significantly changed since the prior exam. There is also stable right basilar small pleural effusion. No acute osseous abnormality. IMPRESSION: Unchanged mildly increased interstitial markings throughout both lungs which could be due to chronic lung changes or mild interstitial edema. Trace right pleural effusion. Electronically Signed   By: Prudencio Pair M.D.   On: 04/19/2019 22:48   DG Swallowing  Func-Speech Pathology  Result Date: 04/25/2019 Objective Swallowing Evaluation: Type of Study: MBS-Modified Barium Swallow Study  Patient Details Name: Aerabella Galasso MRN: [id number] Date of Birth: [birth year] Today's Date: 04/25/2019 Time: SLP Start Time (ACUTE ONLY): 1210 -SLP Stop Time (ACUTE ONLY): 1239 SLP Time Calculation (min) (ACUTE ONLY): 29 min Past Medical History: Past Medical History: Diagnosis Date . Arthritis  . COPD (chronic obstructive pulmonary disease) ([place])  . Diabetes mellitus without complication ([place])  . Diabetic neuropathy ([place])  . Oxygen dependent 03/30/2018  5L home O2 . Renal disorder   ESRD . Thyroid disease  . TIA (transient ischemic attack)  Past Surgical History: Past Surgical History: Procedure Laterality Date . ABDOMINAL HYSTERECTOMY   . IR FLUORO GUIDE CV LINE RIGHT  11/14/2018 . RECTOCELE REPAIR   . REPLACEMENT TOTAL KNEE BILATERAL Bilateral 2008 . THYROIDECTOMY    80% . VAGINAL WOUND CLOSURE / REPAIR   HPI: [place] Doster is an 74 y.o. female past medical history that includes COPD with chronic hypoxic respiratory failure, end-stage renal disease on dialysis Tuesday Thursday Saturday, hypothyroidism, insulin-dependent diabetes, chronic diastolic CHF, history of CVA, recent admission with acute on chronic respiratory failure secondary to Covid presented to the emergency department March 24 chief complaint increased confusion and headache.  Initial work-up in the emergency department included an MRI without contrast of the brain revealing an acute or subacute right temporal infarct. Prior admission notes include the following- 1/23 CT chest>>"no PE, near complete collapse of both the right middle and right lower lobes with extensive endobronchial debris. Consolidation involving the right upper lobe concerning for pneumonia or aspiration pneumonia." Speech consulted for swallowing given concerns for aspiration.  Pt is known to SLP services; has had swallow assessments during admissions  in October and November.  MBS on 11/30 revealed mild pharyngeal dysphagia with frank penetration of thin liquids to the vocal folds; chin tuck assisted in airway protection.  F/U MBS recommended during [place] admission, but pt could not tolerate assessment, Discharged on Dys 3/nectar.  Subjective: cooperative Assessment / Plan / Recommendation CHL IP CLINICAL IMPRESSIONS 04/25/2019 Clinical Impression Patient presents with mild oropharyngeal dysphagia. Oral phase is remarkable for decreased bolus cohesion resulting in premature spillage over the epiglottis and oral residue. Pt required Mod cues to initate multiple swallows, reducing oral residue. Pt has a very curved epiglottis, resulting in a narrow vallecular space. When premature spillage occurs, barium passes over the vallecula, and mistimed epiglottic inversion resulted in deep penetration to the vocal cords (PAS 4). A chin tuck was initially utilized to attempt to improve airway protection (per previous notes), however, the patient was observed to have better oral control when not using a chin tuck. This prevented  premature spillage and penetration. Trace esophageal backflow was noted with all consistencies, and a minimal amount of barium was observed in the esophagus. Recommend mechanical soft diet (Dys 3), thin liquids, and meds crushed in puree. Ensure the pt is following general aspiration precautions, checking intermittently for oral residue.  SLP Visit Diagnosis Dysphagia, unspecified (R13.10) Attention and concentration deficit following -- Frontal lobe and executive function deficit following -- Impact on safety and function Mild aspiration risk   [facility] IP TREATMENT RECOMMENDATION 04/25/2019 Treatment Recommendations Therapy as outlined in treatment plan below   Prognosis 04/25/2019 Prognosis for Safe Diet Advancement Good Barriers to Reach Goals -- Barriers/Prognosis Comment -- [facility] IP DIET RECOMMENDATION 04/25/2019 SLP Diet Recommendations Dysphagia 3 (Mech  soft) solids;Thin liquid Liquid Administration via Straw;Cup Medication Administration Crushed with puree Compensations Slow rate;Small sips/bites Postural Changes Seated upright at 90 degrees   CH[facility] IP OTHER RECOMMENDATIONS 04/25/2019 Recommended Consults Consider esophageal assessment Oral Care Recommendations Oral care BID Other Recommendations --   [facility] IP FOLLOW UP RECOMMENDATIONS 04/25/2019 Follow up Recommendations Skilled Nursing facility   [facility] IP FREQUENCY AND DURATION 04/25/2019 Speech Therapy Frequency (ACUTE ONLY) min 2x/week Treatment Duration 2 weeks      [facility] IP ORAL PHASE 04/25/2019 Oral Phase Impaired Oral - Pudding Teaspoon -- Oral - Pudding Cup -- Oral - Honey Teaspoon -- Oral - Honey Cup -- Oral - Nectar Teaspoon -- Oral - Nectar Cup -- Oral - Nectar Straw Lingual/palatal residue;Decreased bolus cohesion;Premature spillage Oral - Thin Teaspoon -- Oral - Thin Cup -- Oral - Thin Straw Lingual/palatal residue;Decreased bolus cohesion;Premature spillage Oral - Puree Lingual/palatal residue;Impaired mastication;Delayed oral transit Oral - Mech Soft -- Oral - Regular Impaired mastication;Delayed oral transit;Lingual/palatal residue Oral - Multi-Consistency -- Oral - Pill -- Oral Phase - Comment --  CH[facility] IP PHARYNGEAL PHASE 04/25/2019 Pharyngeal Phase Impaired Pharyngeal- Pudding Teaspoon -- Pharyngeal -- Pharyngeal- Pudding Cup -- Pharyngeal -- Pharyngeal- Honey Teaspoon -- Pharyngeal -- Pharyngeal- Honey Cup -- Pharyngeal -- Pharyngeal- Nectar Teaspoon -- Pharyngeal -- Pharyngeal- Nectar Cup -- Pharyngeal -- Pharyngeal- Nectar Straw Penetration/Aspiration during swallow;Reduced epiglottic inversion;Compensatory strategies attempted (with notebox) Pharyngeal Material enters airway, CONTACTS cords and then ejected out Pharyngeal- Thin Teaspoon -- Pharyngeal -- Pharyngeal- Thin Cup -- Pharyngeal -- Pharyngeal- Thin Straw Penetration/Aspiration during swallow;Reduced epiglottic inversion;Compensatory strategies  attempted (with notebox) Pharyngeal Material enters airway, CONTACTS cords and then ejected out Pharyngeal- Puree WFL Pharyngeal -- Pharyngeal- Mechanical Soft -- Pharyngeal -- Pharyngeal- Regular WFL Pharyngeal -- Pharyngeal- Multi-consistency -- Pharyngeal -- Pharyngeal- Pill -- Pharyngeal -- Pharyngeal Comment --  [facility] IP CERVICAL ESOPHAGEAL PHASE 04/25/2019 Cervical Esophageal Phase Impaired Pudding Teaspoon -- Pudding Cup -- Honey Teaspoon -- Honey Cup -- Nectar Teaspoon -- Nectar Cup -- Nectar Straw Esophageal backflow into the pharynx Thin Teaspoon -- Thin Cup -- Thin Straw Esophageal backflow into the pharynx Puree Esophageal backflow into the pharynx Mechanical Soft -- Regular Esophageal backflow into the larynx Multi-consistency -- Pill -- Cervical Esophageal Comment -- Osie Bond., M.A. [facility] Pager [phone] Office [phone] 04/25/2019, 3:51 PM              ECHOCARDIOGRAM COMPLETE  Result Date: 04/21/2019    ECHOCARDIOGRAM REPORT   Patient Name:   Joan Mosley Date of Exam: 04/21/2019 Medical Rec #:  [id number]      Height:       61.0 in Accession #:    [id number]     Weight:       188.3 lb Date of Birth:  1946-01-24      BSA:          1.841 m Patient Age:    73 years       BP:           128/82 mmHg Patient Gender: F              HR:           108 bpm. Exam Location:  Inpatient Procedure: 2D Echo, Cardiac Doppler and Color Doppler Indications:    Stroke 434.91 / I163.9  History:        Patient has prior history of Echocardiogram examinations, most                 recent 09/16/2018. TIA and COPD; Risk Factors:Diabetes. Thyroid                 disease.  Sonographer:    Jonelle Sidle Dance Referring Phys: [id number] KAREN M BLACK  Sonographer Comments: Technically difficult study due to poor echo windows and suboptimal parasternal window. IMPRESSIONS  1. Left ventricular ejection fraction, by estimation, is 60 to 65%. The left ventricle has normal function. Left ventricular  endocardial border not optimally defined to evaluate regional wall motion. Indeterminate diastolic filling due to E-A fusion.  2. Right ventricular systolic function is normal. The right ventricular size is normal. Tricuspid regurgitation signal is inadequate for assessing PA pressure.  3. The mitral valve is degenerative. No evidence of mitral valve regurgitation. No evidence of mitral stenosis.  4. The aortic valve is grossly normal. Aortic valve regurgitation is not visualized. No aortic stenosis is present.  5. The inferior vena cava is normal in size with greater than 50% respiratory variability, suggesting right atrial pressure of 3 mmHg. Conclusion(s)/Recommendation(s): Very poor windows for TTE. Globally normal LV function but unable to exclude RWMA. FINDINGS  Left Ventricle: Left ventricular ejection fraction, by estimation, is 60 to 65%. The left ventricle has normal function. Left ventricular endocardial border not optimally defined to evaluate regional wall motion. The left ventricular internal cavity size was normal in size. There is no left ventricular hypertrophy. Indeterminate diastolic filling due to E-A fusion. Right Ventricle: The right ventricular size is normal. No increase in right ventricular wall thickness. Right ventricular systolic function is normal. Tricuspid regurgitation signal is inadequate for assessing PA pressure. Left Atrium: Left atrial size was normal in size. Right Atrium: Right atrial size was normal in size. Pericardium: A small pericardial effusion is present. The pericardial effusion is anterior to the right ventricle. Mitral Valve: The mitral valve is degenerative in appearance. There is mild thickening of the mitral valve leaflet(s). There is mild calcification of the mitral valve leaflet(s). Mild to moderate mitral annular calcification. No evidence of mitral valve regurgitation. No evidence of mitral valve stenosis. Tricuspid Valve: The tricuspid valve is grossly normal.  Tricuspid valve regurgitation is trivial. No evidence of tricuspid stenosis. Aortic Valve: The aortic valve is grossly normal. Aortic valve regurgitation is not visualized. No aortic stenosis is present. Pulmonic Valve: The pulmonic valve was grossly normal. Pulmonic valve regurgitation is not visualized. Aorta: The aortic root is normal in size and structure. Venous: The inferior vena cava is normal in size with greater than 50% respiratory variability, suggesting right atrial pressure of 3 mmHg. IAS/Shunts: The atrial septum is grossly normal.  LEFT VENTRICLE PLAX 2D LVIDd:         4.00 cm LVIDs:         3.40 cm LV PW:  1.00 cm LV IVS:        1.10 cm LVOT diam:     1.90 cm LV SV:         36 LV SV Index:   20 LVOT Area:     2.84 cm  RIGHT VENTRICLE            IVC RV Basal diam:  2.30 cm    IVC diam: 1.55 cm RV S prime:     9.90 cm/s TAPSE (M-mode): 1.3 cm LEFT ATRIUM             Index       RIGHT ATRIUM          Index LA diam:        3.50 cm 1.90 cm/m  RA Area:     8.22 cm LA Vol (A2C):   40.2 ml 21.84 ml/m RA Volume:   14.50 ml 7.88 ml/m LA Vol (A4C):   41.7 ml 22.65 ml/m LA Biplane Vol: 42.7 ml 23.20 ml/m  AORTIC VALVE LVOT Vmax:   78.70 cm/s LVOT Vmean:  47.650 cm/s LVOT VTI:    0.127 m  AORTA Ao Root diam: 2.70 cm MITRAL VALVE MV Area (PHT): 5.46 cm     SHUNTS MV Decel Time: 139 msec     Systemic VTI:  0.13 m MV E velocity: 106.00 cm/s  Systemic Diam: 1.90 cm Eleonore Chiquito MD Electronically signed by Eleonore Chiquito MD Signature Date/Time: 04/21/2019/12:31:36 PM    Final     Assessment/Plan  1. Acute on chronic anemia Lab Results  Component Value Date   HGB 11.5 (A) 05/15/2019   - S/P transfusion of 3 units PRBC   2. History of CVA (cerebrovascular accident) - follow up with neurology - atorvastatin (LIPITOR) 40 MG tablet; Take 1 tablet (40 mg total) by mouth daily.  Dispense: 30 tablet; Refill: 0  3. ESRD on dialysis ([place]) - on hemodialysis [facility] - sevelamer carbonate (RENVELA)  800 MG tablet; Take 1 tablet (800 mg total) by mouth 3 (three) times daily with meals.  Dispense: 90 tablet; Refill: 0  4. Simple chronic bronchitis (HCC) -BiPAP at night and as needed during daytime for drowsiness, O2 at 4 L/minute via [place] continuously - levalbuterol (XOPENEX HFA) 45 MCG/ACT inhaler; Inhale 2 puffs into the lungs every 4 (four) hours as needed for wheezing.  Dispense: 15 g; Refill: 0 - montelukast (SINGULAIR) 10 MG tablet; Take 1 tablet (10 mg total) by mouth at bedtime.  Dispense: 30 tablet; Refill: 0 - guaiFENesin (MUCINEX) 600 MG 12 hr tablet; Take 1 tablet (600 mg total) by mouth 2 (two) times daily for 60 doses.  5. Acquired hypothyroidism Lab Results  Component Value Date   TSH 6.920 (H) 05/06/2019   - levothyroxine (SYNTHROID) 112 MCG tablet; Take 1 tablet (112 mcg total) by mouth daily before breakfast. 1 AM  Dispense: 30 tablet; Refill: 0  6. Oral yeast infection - nystatin (MYCOSTATIN) 100000 UNIT/ML suspension; Take 5 mLs (500,000 Units total) by mouth 4 (four) times daily for 14 days. Swish and spit  Dispense: 60 mL; Refill: 0  7. Hemodialysis-associated hypotension - midodrine (PROAMATINE) 10 MG tablet; Take 1 tablet (10 mg total) by mouth See admin instructions. Take 10 mg by mouth on Tuesday, Thursday, Saturday 45 minutes before dialysis  Dispense: 12 tablet; Refill: 0  8. Major depression, recurrent, chronic (HCC) - FLUoxetine (PROZAC) 10 MG tablet; Take 1 tablet (10 mg total) by mouth daily.  Dispense: 30 tablet; Refill:  0  9. Neurocognitive deficits - scored 1/30 on SLUMS test, dementia range - continue supportive care   10.  Physical deconditioning -For PT and OT, for therapeutic strengthening exercises -Fall precautions     I have filled out patient's discharge paperwork and e-prescribed mediactions.   Patient will have home health PT, OT and Nursing.  DME provided: Standard wheelchair  Standard wheelchair - Patient had a recent and  recurrent CVA which impairs her ability to perform daily activities like toileting, dressing, grooming and bathing in the home. A cane or walker will not resolve issue with performing activities of daily living. A wheelchair will allow patient to safely perform daily activities. Patient has a caregiver in the home who can provide assistance.      Total discharge time: Greater than 30 minutes Greater than 50% was spent in counseling and coordination of care.   Discharge time involved coordination of the discharge process with social worker, nursing staff and therapy department. Medical justification for home health services/DME verified.    Durenda Age, DNP, FNP-BC [facility] [phone] (Monday-Friday 8:00 a.m. - 5:00 p.m.) [phone] (after hours)

## 2019-05-16 NOTE — Telephone Encounter (Signed)
Pt was called but her daughter wasn't present and she handles her health stuff for her. Need to call back when her daughter is present.

## 2019-05-16 NOTE — Telephone Encounter (Signed)
I need the name and number of facility Ms. Bert is admitted. Is she saying the facility does not provide any transportation to dialysis for their residents? We had completed a form for [employer] to transport to dialysis, do they need a new form completed? If so, I will need a new form.

## 2019-05-16 NOTE — Telephone Encounter (Signed)
Left voicemail for pt to give [name] a call back for more information.

## 2019-05-17 NOTE — Telephone Encounter (Signed)
[name] please advise.  Pt's daughter/caretaker called back she said pt isn't admitted she was discharged from nursing facility within the last day and she has been bed ridden for 33 days and is a fall risk. She is currently taking dialysis at [facility] on [address]. The facility that she was discharged from [facility], said that she doesn't qualify for the ambulance transportation the pt's daughter and son are asking for because they provide transportation and wheelchair services. Family doesn't feel like she is able to go in wheelchair and require ambulance.  Pt also has an appointment in office for hospitalization follow up with you 05/22/19 an pt daughter wondering if this could be a virtual visit?

## 2019-05-18 NOTE — Telephone Encounter (Signed)
What happened to [employer] transportation?

## 2019-05-18 NOTE — Telephone Encounter (Signed)
Spoke with Joan Mosley from [employer]--we are filling out a form to resubmit to medicare to get them to cover this type of transportation.   Pt's daughter is aware.

## 2019-05-19 ENCOUNTER — Telehealth: Payer: Self-pay | Admitting: Nurse Practitioner

## 2019-05-19 NOTE — Telephone Encounter (Signed)
Form faxed

## 2019-05-19 NOTE — Telephone Encounter (Signed)
Joan Mosley--

## 2019-05-19 NOTE — Telephone Encounter (Signed)
Amy is calling and wanted to let Baldo Ash know that she saw patient today and let her know that her plan is to see patient for physical therapy. 2x a week for 4 weeks and 1x a week for 3 weeks. CB [phone]

## 2019-05-22 ENCOUNTER — Ambulatory Visit: Payer: Medicare Other | Admitting: Nurse Practitioner

## 2019-05-24 ENCOUNTER — Telehealth: Payer: Self-pay | Admitting: Nurse Practitioner

## 2019-05-24 NOTE — Telephone Encounter (Signed)
Joan Mosley?

## 2019-05-24 NOTE — Telephone Encounter (Signed)
Amy is calling and wanted to let Baldo Ash know that patient oxygen is supposed to be set for 4 liters per the discharge instructions . CB is [phone]

## 2019-05-30 ENCOUNTER — Other Ambulatory Visit: Payer: Self-pay

## 2019-05-30 ENCOUNTER — Emergency Department (HOSPITAL_COMMUNITY): Payer: Medicare Other

## 2019-05-30 ENCOUNTER — Inpatient Hospital Stay (HOSPITAL_COMMUNITY)
Admission: EM | Admit: 2019-05-30 | Discharge: 2019-06-07 | DRG: 689 | Disposition: A | Discharge status: Home Health | Payer: Medicare Other | Attending: Internal Medicine | Admitting: Internal Medicine

## 2019-05-30 ENCOUNTER — Encounter (HOSPITAL_COMMUNITY): Payer: Self-pay | Admitting: Emergency Medicine

## 2019-05-30 DIAGNOSIS — F0390 Unspecified dementia without behavioral disturbance: Secondary | ICD-10-CM | POA: Diagnosis present

## 2019-05-30 DIAGNOSIS — L8990 Pressure ulcer of unspecified site, unspecified stage: Secondary | ICD-10-CM | POA: Insufficient documentation

## 2019-05-30 DIAGNOSIS — S065X9A Traumatic subdural hemorrhage with loss of consciousness of unspecified duration, initial encounter: Secondary | ICD-10-CM | POA: Diagnosis present

## 2019-05-30 DIAGNOSIS — Z8616 Personal history of COVID-19: Secondary | ICD-10-CM

## 2019-05-30 DIAGNOSIS — Z91013 Allergy to seafood: Secondary | ICD-10-CM

## 2019-05-30 DIAGNOSIS — N2581 Secondary hyperparathyroidism of renal origin: Secondary | ICD-10-CM | POA: Diagnosis present

## 2019-05-30 DIAGNOSIS — G9340 Encephalopathy, unspecified: Secondary | ICD-10-CM | POA: Diagnosis present

## 2019-05-30 DIAGNOSIS — Z8249 Family history of ischemic heart disease and other diseases of the circulatory system: Secondary | ICD-10-CM

## 2019-05-30 DIAGNOSIS — R4182 Altered mental status, unspecified: Secondary | ICD-10-CM

## 2019-05-30 DIAGNOSIS — E890 Postprocedural hypothyroidism: Secondary | ICD-10-CM | POA: Diagnosis present

## 2019-05-30 DIAGNOSIS — D539 Nutritional anemia, unspecified: Secondary | ICD-10-CM | POA: Diagnosis present

## 2019-05-30 DIAGNOSIS — M1990 Unspecified osteoarthritis, unspecified site: Secondary | ICD-10-CM | POA: Diagnosis present

## 2019-05-30 DIAGNOSIS — Z96653 Presence of artificial knee joint, bilateral: Secondary | ICD-10-CM | POA: Diagnosis present

## 2019-05-30 DIAGNOSIS — D631 Anemia in chronic kidney disease: Secondary | ICD-10-CM | POA: Diagnosis present

## 2019-05-30 DIAGNOSIS — F329 Major depressive disorder, single episode, unspecified: Secondary | ICD-10-CM | POA: Diagnosis present

## 2019-05-30 DIAGNOSIS — N390 Urinary tract infection, site not specified: Principal | ICD-10-CM

## 2019-05-30 DIAGNOSIS — Z7401 Bed confinement status: Secondary | ICD-10-CM

## 2019-05-30 DIAGNOSIS — Z880 Allergy status to penicillin: Secondary | ICD-10-CM

## 2019-05-30 DIAGNOSIS — Z79890 Hormone replacement therapy (postmenopausal): Secondary | ICD-10-CM

## 2019-05-30 DIAGNOSIS — Z882 Allergy status to sulfonamides status: Secondary | ICD-10-CM

## 2019-05-30 DIAGNOSIS — H9190 Unspecified hearing loss, unspecified ear: Secondary | ICD-10-CM | POA: Diagnosis present

## 2019-05-30 DIAGNOSIS — I6200 Nontraumatic subdural hemorrhage, unspecified: Secondary | ICD-10-CM | POA: Diagnosis present

## 2019-05-30 DIAGNOSIS — E1129 Type 2 diabetes mellitus with other diabetic kidney complication: Secondary | ICD-10-CM | POA: Diagnosis present

## 2019-05-30 DIAGNOSIS — E1122 Type 2 diabetes mellitus with diabetic chronic kidney disease: Secondary | ICD-10-CM | POA: Diagnosis present

## 2019-05-30 DIAGNOSIS — K8070 Calculus of gallbladder and bile duct without cholecystitis without obstruction: Secondary | ICD-10-CM | POA: Diagnosis present

## 2019-05-30 DIAGNOSIS — Z888 Allergy status to other drugs, medicaments and biological substances status: Secondary | ICD-10-CM

## 2019-05-30 DIAGNOSIS — Z87891 Personal history of nicotine dependence: Secondary | ICD-10-CM

## 2019-05-30 DIAGNOSIS — I953 Hypotension of hemodialysis: Secondary | ICD-10-CM

## 2019-05-30 DIAGNOSIS — Z20822 Contact with and (suspected) exposure to covid-19: Secondary | ICD-10-CM | POA: Diagnosis present

## 2019-05-30 DIAGNOSIS — Z79899 Other long term (current) drug therapy: Secondary | ICD-10-CM

## 2019-05-30 DIAGNOSIS — G9341 Metabolic encephalopathy: Secondary | ICD-10-CM | POA: Diagnosis present

## 2019-05-30 DIAGNOSIS — Z9981 Dependence on supplemental oxygen: Secondary | ICD-10-CM

## 2019-05-30 DIAGNOSIS — Z992 Dependence on renal dialysis: Secondary | ICD-10-CM

## 2019-05-30 DIAGNOSIS — F419 Anxiety disorder, unspecified: Secondary | ICD-10-CM | POA: Diagnosis present

## 2019-05-30 DIAGNOSIS — J449 Chronic obstructive pulmonary disease, unspecified: Secondary | ICD-10-CM | POA: Diagnosis present

## 2019-05-30 DIAGNOSIS — N186 End stage renal disease: Secondary | ICD-10-CM

## 2019-05-30 DIAGNOSIS — Z794 Long term (current) use of insulin: Secondary | ICD-10-CM

## 2019-05-30 DIAGNOSIS — E876 Hypokalemia: Secondary | ICD-10-CM | POA: Diagnosis present

## 2019-05-30 DIAGNOSIS — J9611 Chronic respiratory failure with hypoxia: Secondary | ICD-10-CM | POA: Diagnosis present

## 2019-05-30 DIAGNOSIS — K8020 Calculus of gallbladder without cholecystitis without obstruction: Secondary | ICD-10-CM

## 2019-05-30 DIAGNOSIS — Z885 Allergy status to narcotic agent status: Secondary | ICD-10-CM

## 2019-05-30 DIAGNOSIS — Z8673 Personal history of transient ischemic attack (TIA), and cerebral infarction without residual deficits: Secondary | ICD-10-CM

## 2019-05-30 HISTORY — DX: Sleep apnea, unspecified: G47.30

## 2019-05-30 LAB — URINALYSIS, ROUTINE W REFLEX MICROSCOPIC
Bilirubin Urine: NEGATIVE
Glucose, UA: 150 mg/dL — AB
Ketones, ur: NEGATIVE mg/dL
Nitrite: NEGATIVE
Protein, ur: 100 mg/dL — AB
Specific Gravity, Urine: 1.011 (ref 1.005–1.030)
WBC, UA: >50 WBC/hpf — ABNORMAL HIGH (ref 0–5)
pH: 8 (ref 5.0–8.0)

## 2019-05-30 LAB — COMPREHENSIVE METABOLIC PANEL
ALT: 17 U/L (ref 0–44)
AST: 31 U/L (ref 15–41)
Albumin: 2.5 g/dL — ABNORMAL LOW (ref 3.5–5.0)
Alkaline Phosphatase: 142 U/L — ABNORMAL HIGH (ref 38–126)
Anion gap: 19 — ABNORMAL HIGH (ref 5–15)
BUN: 31 mg/dL — ABNORMAL HIGH (ref 8–23)
CO2: 23 mmol/L (ref 22–32)
Calcium: 9.1 mg/dL (ref 8.9–10.3)
Chloride: 90 mmol/L — ABNORMAL LOW (ref 98–111)
Creatinine, Ser: 5.3 mg/dL — ABNORMAL HIGH (ref 0.44–1.00)
GFR calc Af Amer: 8 mL/min — ABNORMAL LOW (ref >60)
GFR calc non Af Amer: 7 mL/min — ABNORMAL LOW (ref >60)
Glucose, Bld: 112 mg/dL — ABNORMAL HIGH (ref 70–99)
Potassium: 3.8 mmol/L (ref 3.5–5.1)
Sodium: 132 mmol/L — ABNORMAL LOW (ref 135–145)
Total Bilirubin: 0.8 mg/dL (ref 0.3–1.2)
Total Protein: 5.8 g/dL — ABNORMAL LOW (ref 6.5–8.1)

## 2019-05-30 LAB — CBC WITH DIFFERENTIAL/PLATELET
Abs Immature Granulocytes: 0.12 10*3/uL — ABNORMAL HIGH (ref 0.00–0.07)
Basophils Absolute: 0.1 10*3/uL (ref 0.0–0.1)
Basophils Relative: 0 %
Eosinophils Absolute: 0 10*3/uL (ref 0.0–0.5)
Eosinophils Relative: 0 %
HCT: 39.2 % (ref 36.0–46.0)
Hemoglobin: 11.6 g/dL — ABNORMAL LOW (ref 12.0–15.0)
Immature Granulocytes: 1 %
Lymphocytes Relative: 13 %
Lymphs Abs: 2.3 10*3/uL (ref 0.7–4.0)
MCH: 28.2 pg (ref 26.0–34.0)
MCHC: 29.6 g/dL — ABNORMAL LOW (ref 30.0–36.0)
MCV: 95.4 fL (ref 80.0–100.0)
Monocytes Absolute: 1.4 10*3/uL — ABNORMAL HIGH (ref 0.1–1.0)
Monocytes Relative: 7 %
Neutro Abs: 14.5 10*3/uL — ABNORMAL HIGH (ref 1.7–7.7)
Neutrophils Relative %: 79 %
Platelets: 152 10*3/uL (ref 150–400)
RBC: 4.11 MIL/uL (ref 3.87–5.11)
RDW: 17.2 % — ABNORMAL HIGH (ref 11.5–15.5)
WBC: 18.3 10*3/uL — ABNORMAL HIGH (ref 4.0–10.5)
nRBC: 0 % (ref 0.0–0.2)

## 2019-05-30 LAB — URINALYSIS, MICROSCOPIC (REFLEX)
Squamous Epithelial / HPF: >50 (ref 0–5)
WBC, UA: >50 WBC/hpf (ref 0–5)

## 2019-05-30 LAB — RESPIRATORY PANEL BY RT PCR (FLU A&B, COVID)
Influenza A by PCR: NEGATIVE
Influenza B by PCR: NEGATIVE
SARS Coronavirus 2 by RT PCR: NEGATIVE

## 2019-05-30 LAB — GLUCOSE, CAPILLARY: Glucose-Capillary: 89 mg/dL (ref 70–99)

## 2019-05-30 LAB — CBG MONITORING, ED: Glucose-Capillary: 106 mg/dL — ABNORMAL HIGH (ref 70–99)

## 2019-05-30 MED ORDER — SODIUM CHLORIDE 0.9 % IV SOLN
1.0000 g | Freq: Once | INTRAVENOUS | Status: AC
  Administered 2019-05-30: 1 g via INTRAVENOUS
  Filled 2019-05-30: qty 10

## 2019-05-30 NOTE — ED Provider Notes (Addendum)
[facility] Provider Note   CSN: [id number] Arrival date & time: 05/30/19  1246     History Chief Complaint  Patient presents with  . Altered Mental Status    Joan Mosley is a 74 y.o. female presenting for evaluation of altered mental status.  Level 5 caveat due to AMS.  Patient states she is not altered, but her family keeps calling her crazy.  Patient states she has been having some abdominal pain, and threw up.  She denies known fevers, chills, cough, chest pain, urinary symptoms, normal bowel movements.  She states she has been feeling very tired and weak.  Additional history obtained from patient son, Joan Mosley.  He states for the past 3 days, patient has been slouching to the side, and very confused.  She thought it was Christmas, and thought he was redoing the bathroom in the house.  He reports family feels like her face is asymmetrical or not her baseline.  She has been complaining of abdominal pain, and have one episode of emesis yesterday.  He confirms that there have been no known fevers, cough, or change in bowel movements.  Additional history obtained from chart review.  Patient was diagnosed with Covid in January 2021.  Since then, she has been seen in the ED for altered mental status, and was found to have an acute stroke in March.  She was admitted to the hospital earlier this month for anemia, with a hemoglobin of 4.7.  Additional history of COPD on 5 L of oxygen, diabetes, ESRD on dialysis (T/Th/Sa), CVA.  HPI     Past Medical History:  Diagnosis Date  . Arthritis   . COPD (chronic obstructive pulmonary disease) ([place])   . Diabetic neuropathy ([place])   . 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 Renal disorder    ESRD  . Thyroid disease   . TIA (transient ischemic attack)   . Type 2 diabetes mellitus with peripheral neuropathy [facility])     Patient Active Problem List   Diagnosis Date Noted  . Symptomatic anemia  05/04/2019  . Neurocognitive deficits 04/27/2019  . Adult failure to thrive   . Acute encephalopathy 04/20/2019  . Stroke ([place]) 04/20/2019  . Dysphagia 03/29/2019  . Aspiration into airway   . Pneumonia due to COVID-19 virus 02/09/2019  . Hypotension 12/24/2018  . Aspiration pneumonia of right lower lobe ([place])   . COPD (chronic obstructive pulmonary disease) ([place]) 11/11/2018  . TIA (transient ischemic attack)   . HLD (hyperlipidemia)   . Type II diabetes mellitus with renal manifestations ([place])   . Depression with anxiety   . Advanced care planning/counseling discussion   . Goals of care, counseling/discussion   . Palliative care by specialist   . Pulmonary edema 09/08/2018  . Respiratory failure ([place]) 09/08/2018  . Weakness   . Chronic respiratory failure with hypoxia, on home O2 therapy ([place]) 09/06/2018  . Subdural hematoma ([place]) 08/22/2018  . Elevated troponin 08/22/2018  . ESRD on dialysis ([place]) 08/22/2018  . Anxiety 07/04/2018  . Counseling regarding advanced directives and goals of care 07/01/2018  . Full code status 07/01/2018  . Drug-induced constipation 07/01/2018  . Hemodialysis-associated hypotension 07/01/2018  . Dialysis patient ([place]) 07/01/2018  . HOH (hard of hearing) 07/01/2018  . Stage 5 chronic kidney disease on chronic dialysis ([place]) 07/01/2018  . Type 2 diabetes mellitus with diabetic polyneuropathy, with long-term current use of insulin ([place]) 07/01/2018  . Hypothyroidism 07/01/2018  .  Gait instability 07/01/2018  . Hyperkalemia 04/20/2018  . Cerebral infarction, unspecified ([place]) 04/05/2018  . Chronic diastolic (congestive) heart failure ([place]) 04/05/2018  . Diarrhea, unspecified 04/05/2018  . Pain, unspecified 04/05/2018  . Pruritus, unspecified 04/05/2018  . Shortness of breath 04/05/2018  . Coagulation defect, unspecified ([place]) 03/31/2018  . Anemia in chronic kidney disease 03/25/2018  . Iron deficiency anemia, unspecified 03/25/2018  . Secondary  hyperparathyroidism of renal origin ([place]) 03/25/2018    Past Surgical History:  Procedure Laterality Date  . ABDOMINAL HYSTERECTOMY    . IR FLUORO GUIDE CV LINE RIGHT  11/14/2018  . RECTOCELE REPAIR    . REPLACEMENT TOTAL KNEE BILATERAL Bilateral 2008  . THYROIDECTOMY     80%  . VAGINAL WOUND CLOSURE / REPAIR       OB History   No obstetric history on file.     Family History  Problem Relation Age of Onset  . Heart disease Mother   . Hypertension Mother   . Heart disease Father   . Hypertension Father     Social History   Tobacco Use  . Smoking status: Former Smoker    Packs/day: 1.00    Years: 35.00    Pack years: 35.00    Quit date: 03/29/1981    Years since quitting: 38.1  . Smokeless tobacco: Never Used  Substance Use Topics  . Alcohol use: Never  . Drug use: Never    Home Medications Prior to Admission medications   Medication Sig Start Date End Date Taking? Authorizing Provider  acetaminophen (TYLENOL) 325 MG tablet Take 650 mg by mouth every 6 (six) hours as needed (for arthritic pain).    Yes [provider]  ascorbic acid (VITAMIN C) 500 MG tablet Take 1 tablet (500 mg total) by mouth daily. 02/23/19  Yes Hosie Poisson, MD  atorvastatin (LIPITOR) 40 MG tablet Take 1 tablet (40 mg total) by mouth daily. 05/16/19  Yes Medina-Vargas, Monina C, NP  b complex-vitamin c-folic acid (NEPHRO-VITE) 0.8 MG TABS tablet Take 1 tablet by mouth at bedtime.   Yes [provider]  benzonatate (TESSALON) 200 MG capsule Take 1 capsule (200 mg total) by mouth 2 (two) times daily as needed for cough. 02/08/19  Yes Nche, Charlene Brooke, NP  clonazePAM (KLONOPIN) 0.5 MG tablet TAKE 1 2 (ONE HALF) TABLET BY MOUTH IN THE MORNING AND 1 TABLET IN THE EVENING 05/20/19  Yes [provider]  FLUoxetine (PROZAC) 10 MG tablet Take 1 tablet (10 mg total) by mouth daily. 05/16/19  Yes Medina-Vargas, Monina C, NP  furosemide (LASIX) 20 MG tablet Take 40 mg by mouth. Non -    Yes [provider]  insulin detemir (LEVEMIR FLEXTOUCH) 100 UNIT/ML FlexPen Inject 10 Units into the skin at bedtime.   Yes [provider]  ipratropium-albuterol (DUONEB) 0.5-2.5 (3) MG/3ML SOLN Take 3 mLs by nebulization 4 (four) times daily.   Yes [provider]  levalbuterol Penne Lash HFA) 45 MCG/ACT inhaler Inhale 2 puffs into the lungs every 4 (four) hours as needed for wheezing. 05/16/19  Yes Medina-Vargas, Monina C, NP  levothyroxine (EUTHYROX) 112 MCG tablet Take 112 mcg by mouth daily before breakfast.   Yes [provider]  Lubricants (K-Y LUBRICANT JELLY SENSITIVE EX) Place 1 application into both nostrils as needed (as directed- for lubrication).    Yes [provider]  midodrine (PROAMATINE) 10 MG tablet Take 1 tablet (10 mg total) by mouth See admin instructions. Take 10 mg by mouth on  Tuesday, Thursday, Saturday 45 minutes before dialysis 05/16/19  Yes Medina-Vargas, Monina C, NP  montelukast (SINGULAIR) 10 MG tablet Take 1 tablet (10 mg total) by mouth at bedtime. 05/16/19  Yes Medina-Vargas, Monina C, NP  NON FORMULARY Inhale into the lungs at bedtime. BiPAP IPAP 18, EPAP 5, flow rate 5. And during the day as needed for naps   Yes [provider]  oxymetazoline (AFRIN) 0.05 % nasal spray Place 1 spray into both nostrils 2 (two) times daily as needed for congestion.   Yes [provider]  Darbepoetin Alfa (ARANESP) 25 MCG/0.42ML SOSY injection Inject 0.42 mLs (25 mcg total) into the vein every Tuesday with hemodialysis. 11/22/18   Hosie Poisson, MD  docusate sodium (COLACE) 100 MG capsule Take 100 mg by mouth every morning.    [provider]  doxercalciferol (HECTOROL) 4 MCG/2ML injection Inject 1.5 mLs (3 mcg total) into the vein Every Tuesday,Thursday,and Saturday with dialysis. 04/25/19   Hosie Poisson, MD  guaiFENesin (MUCINEX) 600 MG 12 hr tablet Take 1 tablet (600 mg total) by mouth 2 (two) times daily for 60  doses. Patient not taking: Reported on 05/30/2019 05/16/19 06/15/19  Medina-Vargas, Monina C, NP  levothyroxine (SYNTHROID) 112 MCG tablet Take 1 tablet (112 mcg total) by mouth daily before breakfast. 1 AM 05/16/19   Medina-Vargas, Monina C, NP  Lidocaine (ASPERCREME LIDOCAINE) 4 % PTCH Apply 1 patch topically daily as needed (Pain). Patient not taking: Reported on 05/30/2019 05/16/19   Medina-Vargas, Senaida Lange, NP  NON FORMULARY Renal Diet Mechanical soft Nectar thick liquids    [provider]  Nutritional Supplements (FEEDING SUPPLEMENT, NEPRO CARB STEADY,) LIQD Take 237 mLs by mouth 2 (two) times daily between meals. Patient not taking: Reported on 05/30/2019 11/22/18   Hosie Poisson, MD  nystatin (MYCOSTATIN) 100000 UNIT/ML suspension Take 5 mLs (500,000 Units total) by mouth 4 (four) times daily for 14 days. Swish and spit 05/16/19 05/30/19  Medina-Vargas, Monina C, NP  nystatin (MYCOSTATIN/NYSTOP) powder Apply 1 application topically 2 (two) times daily as needed (as directed- to any rashes). 05/16/19   Medina-Vargas, Monina C, NP  OXYGEN Inhale 4 L into the lungs continuous.    [provider]  pantoprazole (PROTONIX) 40 MG tablet Take 40 mg by mouth daily. 05/20/19   [provider]  senna (SENOKOT) 8.6 MG TABS tablet Take 1 tablet by mouth every morning.     [provider]  sevelamer carbonate (RENVELA) 800 MG tablet Take 1 tablet (800 mg total) by mouth 3 (three) times daily with meals. 05/16/19   Medina-Vargas, Monina C, NP  SIMPLY SALINE NA Place 2 sprays into the nose as needed (congestion).     [provider]  heparin 5000 UNIT/ML injection Inject 1.5 mLs (7,500 Units total) into the skin every 8 (eight) hours. 02/22/19 04/20/19  Hosie Poisson, MD    Allergies    Avelox [moxifloxacin hcl in nacl], Codeine, Penicillins, Sulfa antibiotics, Dextromethorphan-guaifenesin, and Shellfish allergy  Review of Systems   Review of Systems  Gastrointestinal:  Positive for abdominal pain and vomiting (x1).  Neurological: Positive for weakness.  Psychiatric/Behavioral: Positive for confusion.  All other systems reviewed and are negative.   Physical Exam Updated Vital Signs BP (!) 117/55   Pulse 94   Temp 98.4 F (36.9 C) (Oral)   Resp (!) 25   SpO2 96%   Physical Exam Vitals and nursing note reviewed.  Constitutional:      General: She is not in acute  distress.    Appearance: She is well-developed.     Comments: Elderly female who appears nontoxic  HENT:     Head: Normocephalic and atraumatic.  Eyes:     Extraocular Movements: Extraocular movements intact.     Conjunctiva/sclera: Conjunctivae normal.     Pupils: Pupils are equal, round, and reactive to light.  Cardiovascular:     Rate and Rhythm: Normal rate and regular rhythm.     Pulses: Normal pulses.  Pulmonary:     Effort: Pulmonary effort is normal. No respiratory distress.     Breath sounds: Normal breath sounds. No wheezing.  Abdominal:     General: There is no distension.     Palpations: Abdomen is soft. There is no mass.     Tenderness: There is no abdominal tenderness. There is no guarding or rebound.  Musculoskeletal:        General: Normal range of motion.     Cervical back: Normal range of motion and neck supple.  Skin:    General: Skin is warm and dry.     Capillary Refill: Capillary refill takes less than 2 seconds.  Neurological:     Mental Status: She is alert.     GCS: GCS eye subscore is 4. GCS verbal subscore is 5. GCS motor subscore is 6.     Sensory: Sensation is intact.     Motor: Motor function is intact.     Comments: Oriented to person and place.  Moving arms and legs without difficulty.  Sensation intact x4.  No obvious facial droop on my exam.     ED Results / Procedures / Treatments   Labs (all labs ordered are listed, but only abnormal results are displayed) Labs Reviewed  CBC WITH DIFFERENTIAL/PLATELET - Abnormal; Notable for the  following components:      Result Value   WBC 18.3 (*)    Hemoglobin 11.6 (*)    MCHC 29.6 (*)    RDW 17.2 (*)    Neutro Abs 14.5 (*)    Monocytes Absolute 1.4 (*)    Abs Immature Granulocytes 0.12 (*)    All other components within normal limits  COMPREHENSIVE METABOLIC PANEL - Abnormal; Notable for the following components:   Sodium 132 (*)    Chloride 90 (*)    Glucose, Bld 112 (*)    BUN 31 (*)    Creatinine, Ser 5.30 (*)    Total Protein 5.8 (*)    Albumin 2.5 (*)    Alkaline Phosphatase 142 (*)    GFR calc non Af Amer 7 (*)    GFR calc Af Amer 8 (*)    Anion gap 19 (*)    All other components within normal limits  CBG MONITORING, ED - Abnormal; Notable for the following components:   Glucose-Capillary 106 (*)    All other components within normal limits  URINE CULTURE  URINALYSIS, ROUTINE W REFLEX MICROSCOPIC    EKG EKG Interpretation  Date/Time:  Tuesday May 30 2019 12:48:15 EDT Ventricular Rate:  94 PR Interval:    QRS Duration: 101 QT Interval:  459 QTC Calculation: 575 R Axis:   -15 Text Interpretation: Sinus rhythm Borderline left axis deviation Abnormal R-wave progression, late transition Nonspecific T abnrm, anterolateral leads Prolonged QT interval Confirmed by Davonna Belling [phone]) on 05/30/2019 2:36:08 PM   Radiology DG Chest 2 View  Result Date: 05/30/2019 CLINICAL DATA:  Altered mental status. EXAM: CHEST - 2 VIEW COMPARISON:  05/04/2019. FINDINGS: Dual-lumen catheter noted  with tip over SVC in stable position. Heart size stable. Stable atelectatic changes and/or infiltrates both lung bases. Bilateral interstitial prominence again noted. Stable small right pleural effusion. No pneumothorax. No acute bony abnormality identified. Diffuse osteopenia degenerative change thoracic spine. IMPRESSION: 1.  Dual-lumen catheter stable position. 2. Stable atelectatic changes and/or infiltrate both lung bases. Bilateral interstitial prominence again noted. Small  stable small right pleural effusion. Chest is unchanged from prior exam. Electronically Signed   By: Marcello Moores  Register   On: 05/30/2019 14:25    Procedures Procedures (including critical care time)  Medications Ordered in ED Medications - No data to display  ED Course  I have reviewed the triage vital signs and the nursing notes.  Pertinent labs & imaging results that were available during my care of the patient were reviewed by me and considered in my medical decision making (see chart for details).    MDM Rules/Calculators/A&P                      Patient presenting for evaluation of altered mental status.  On exam, patient appears nontoxic.  She is responding appropriately to questions (however unsure if her answers are true/correct).  Per son, it appears patient has been acutely altered for the past 3 days.  Will obtain labs, CT head, EKG, and urine.  Consider recurrent anemia.  Consider repeat stroke.  Consider infection.  Consider delirium. Due to confusion, pt will likely need to be admitted.   Labs interpreted by me, new leukocytosis of 18.  Electrolytes are at patient's baseline.  Chest x-ray viewed interpreted by me, no pneumonia. Per radiology, stable R pleural effusion.   On reevaluation, patient remains stable.  Patient's son, [name], is in the room.  He states patient is acting more like her baseline.  He thinks his siblings do not want to take care of his mom, and that is why they sent her in.  Received a call from radiology that patient's head CT shows very small bilateral subdurals that appear subacute, however new since 05-04-2019.  When asked if this could be the cause for worsening confusion over the past several days, radiology is unsure.  As such, will consult with neurosurgery in the setting of possible worsening confusion with subacute subdurals.  There is no mass-effect per radiology.  Pt signed out to Raquel James, PA-C for f/u on neurosurg consult, UA, and final dispo.      Final Clinical Impression(s) / ED Diagnoses Final diagnoses:  None    Rx / DC Orders ED Discharge Orders    None       Erick Alley 05/30/19 1544    Davonna Belling, MD 05/31/19 [place], Dashon Mcintire, PA-C 06/01/19 1409    Davonna Belling, MD 06/01/19 [phone]

## 2019-05-30 NOTE — Progress Notes (Signed)
Pateint admitted to [phone]. No complaints of pain. Patient oriented to room, call bell, bed alarm on and audible. Son, [name] called for update on patient plan of care.

## 2019-05-30 NOTE — Progress Notes (Signed)
Per patient's son, patient wears CPAP at night.  There is no indication of sleep apnea history in the patient's chart, and when asked, the patient states that she does not wear a mask to sleep.   Patient is being admitted for altered mental status, which would exclude her from CPAP therapy due to the inability to remove the mask in the event of an emergency.  Patient is currently resting comfortably in no obvious distress, on 5L nasal cannula, which is her home regimen.  Sats 95%, RR   20, HR  91.  Arouses easliy and responds to questioning.  RN aware.

## 2019-05-30 NOTE — ED Provider Notes (Signed)
74 year old female with ESRD on dialysis Tu/Thu/Sat, diabetes mellitus type 2, chronic hypoxic respiratory failure on oxygen, hypothyroidism, recent COVID-19 infection about 2 months ago, diabetic neuropathy. Family state that since Sunday she has been more confused, hallucinating, "slouching" and has possible facial droop. She has had multiple admission over the past couple months for various problems: COVID, acute CVA, anemia requiring a blood transfusion. She missed dialysis today - goes to [facility] on [address].   Labs show new leukocytosis (18) and mild anemia (hgb 11). CMP shows baseline CKD. LFTs are normal. There has been some reported abdominal pain and vomiting so CT head, UA, CT abdomen/pelvis were ordered. CXR here is negative.   CT shows subacute subdural hematoma. Neurosurgery was consulted - discussed with Dr. Zada Finders who states that these are small and would not cause AMS. No f/u was recommended.   UA shows many bacteria with >50 WBC and WBC clumps. Culture sent and Rocephin ordered.  CT abdomen/pelvis shows likely choledocholithiasis which was present on her last CT scan in November. Pt has no abdominal pain at present.   Discussed with her son at bedside. Will admit for UTI with AMS. COVID test ordered. Discussed with Dr. Hal Hope who will admit.    Recardo Evangelist, PA-C 05/30/19 2000    Rex Kras Wenda Overland, MD 05/30/19 2213

## 2019-05-30 NOTE — ED Triage Notes (Signed)
Patient arrives to ED with complaints of altered mental status since her TIA three weeks ago. Per family patient is seeing things that aren't there, is lethargic, and talking nonsense. Patient alert and orented on arrival. Missed last dialysis scheduled day, patient is due today. On 5L [place] at all times.

## 2019-05-31 ENCOUNTER — Inpatient Hospital Stay (HOSPITAL_COMMUNITY): Payer: Medicare Other

## 2019-05-31 ENCOUNTER — Encounter (HOSPITAL_COMMUNITY): Payer: Self-pay | Admitting: Internal Medicine

## 2019-05-31 DIAGNOSIS — J449 Chronic obstructive pulmonary disease, unspecified: Secondary | ICD-10-CM | POA: Diagnosis present

## 2019-05-31 DIAGNOSIS — R748 Abnormal levels of other serum enzymes: Secondary | ICD-10-CM

## 2019-05-31 DIAGNOSIS — E890 Postprocedural hypothyroidism: Secondary | ICD-10-CM | POA: Diagnosis present

## 2019-05-31 DIAGNOSIS — Z96653 Presence of artificial knee joint, bilateral: Secondary | ICD-10-CM | POA: Diagnosis present

## 2019-05-31 DIAGNOSIS — N390 Urinary tract infection, site not specified: Secondary | ICD-10-CM

## 2019-05-31 DIAGNOSIS — N186 End stage renal disease: Secondary | ICD-10-CM

## 2019-05-31 DIAGNOSIS — N2581 Secondary hyperparathyroidism of renal origin: Secondary | ICD-10-CM | POA: Diagnosis present

## 2019-05-31 DIAGNOSIS — K8050 Calculus of bile duct without cholangitis or cholecystitis without obstruction: Secondary | ICD-10-CM

## 2019-05-31 DIAGNOSIS — D631 Anemia in chronic kidney disease: Secondary | ICD-10-CM | POA: Diagnosis present

## 2019-05-31 DIAGNOSIS — M1990 Unspecified osteoarthritis, unspecified site: Secondary | ICD-10-CM | POA: Diagnosis present

## 2019-05-31 DIAGNOSIS — H9190 Unspecified hearing loss, unspecified ear: Secondary | ICD-10-CM | POA: Diagnosis present

## 2019-05-31 DIAGNOSIS — Z992 Dependence on renal dialysis: Secondary | ICD-10-CM | POA: Diagnosis not present

## 2019-05-31 DIAGNOSIS — F0390 Unspecified dementia without behavioral disturbance: Secondary | ICD-10-CM | POA: Diagnosis present

## 2019-05-31 DIAGNOSIS — S065X9A Traumatic subdural hemorrhage with loss of consciousness of unspecified duration, initial encounter: Secondary | ICD-10-CM | POA: Diagnosis not present

## 2019-05-31 DIAGNOSIS — Z8249 Family history of ischemic heart disease and other diseases of the circulatory system: Secondary | ICD-10-CM | POA: Diagnosis not present

## 2019-05-31 DIAGNOSIS — Z8673 Personal history of transient ischemic attack (TIA), and cerebral infarction without residual deficits: Secondary | ICD-10-CM | POA: Diagnosis not present

## 2019-05-31 DIAGNOSIS — G9340 Encephalopathy, unspecified: Secondary | ICD-10-CM | POA: Diagnosis present

## 2019-05-31 DIAGNOSIS — E1122 Type 2 diabetes mellitus with diabetic chronic kidney disease: Secondary | ICD-10-CM

## 2019-05-31 DIAGNOSIS — F419 Anxiety disorder, unspecified: Secondary | ICD-10-CM | POA: Diagnosis present

## 2019-05-31 DIAGNOSIS — J410 Simple chronic bronchitis: Secondary | ICD-10-CM

## 2019-05-31 DIAGNOSIS — E876 Hypokalemia: Secondary | ICD-10-CM | POA: Diagnosis present

## 2019-05-31 DIAGNOSIS — Z794 Long term (current) use of insulin: Secondary | ICD-10-CM

## 2019-05-31 DIAGNOSIS — D539 Nutritional anemia, unspecified: Secondary | ICD-10-CM | POA: Diagnosis present

## 2019-05-31 DIAGNOSIS — G9341 Metabolic encephalopathy: Secondary | ICD-10-CM | POA: Diagnosis present

## 2019-05-31 DIAGNOSIS — J9611 Chronic respiratory failure with hypoxia: Secondary | ICD-10-CM | POA: Diagnosis present

## 2019-05-31 DIAGNOSIS — I6200 Nontraumatic subdural hemorrhage, unspecified: Secondary | ICD-10-CM | POA: Diagnosis present

## 2019-05-31 DIAGNOSIS — K8070 Calculus of gallbladder and bile duct without cholecystitis without obstruction: Secondary | ICD-10-CM | POA: Diagnosis present

## 2019-05-31 DIAGNOSIS — F329 Major depressive disorder, single episode, unspecified: Secondary | ICD-10-CM | POA: Diagnosis present

## 2019-05-31 DIAGNOSIS — N185 Chronic kidney disease, stage 5: Secondary | ICD-10-CM

## 2019-05-31 DIAGNOSIS — Z8616 Personal history of COVID-19: Secondary | ICD-10-CM | POA: Diagnosis not present

## 2019-05-31 DIAGNOSIS — Z20822 Contact with and (suspected) exposure to covid-19: Secondary | ICD-10-CM | POA: Diagnosis present

## 2019-05-31 LAB — CBC
HCT: 34.3 % — ABNORMAL LOW (ref 36.0–46.0)
Hemoglobin: 10.5 g/dL — ABNORMAL LOW (ref 12.0–15.0)
MCH: 28.8 pg (ref 26.0–34.0)
MCHC: 30.6 g/dL (ref 30.0–36.0)
MCV: 94 fL (ref 80.0–100.0)
Platelets: 130 10*3/uL — ABNORMAL LOW (ref 150–400)
RBC: 3.65 MIL/uL — ABNORMAL LOW (ref 3.87–5.11)
RDW: 17.2 % — ABNORMAL HIGH (ref 11.5–15.5)
WBC: 16.4 10*3/uL — ABNORMAL HIGH (ref 4.0–10.5)
nRBC: 0 % (ref 0.0–0.2)

## 2019-05-31 LAB — BASIC METABOLIC PANEL
Anion gap: 18 — ABNORMAL HIGH (ref 5–15)
BUN: 37 mg/dL — ABNORMAL HIGH (ref 8–23)
CO2: 21 mmol/L — ABNORMAL LOW (ref 22–32)
Calcium: 8.9 mg/dL (ref 8.9–10.3)
Chloride: 92 mmol/L — ABNORMAL LOW (ref 98–111)
Creatinine, Ser: 5.78 mg/dL — ABNORMAL HIGH (ref 0.44–1.00)
GFR calc Af Amer: 8 mL/min — ABNORMAL LOW (ref >60)
GFR calc non Af Amer: 7 mL/min — ABNORMAL LOW (ref >60)
Glucose, Bld: 91 mg/dL (ref 70–99)
Potassium: 3.5 mmol/L (ref 3.5–5.1)
Sodium: 131 mmol/L — ABNORMAL LOW (ref 135–145)

## 2019-05-31 LAB — HEPATIC FUNCTION PANEL
ALT: 16 U/L (ref 0–44)
AST: 29 U/L (ref 15–41)
Albumin: 2.2 g/dL — ABNORMAL LOW (ref 3.5–5.0)
Alkaline Phosphatase: 132 U/L — ABNORMAL HIGH (ref 38–126)
Bilirubin, Direct: 0.2 mg/dL (ref 0.0–0.2)
Indirect Bilirubin: 0.5 mg/dL (ref 0.3–0.9)
Total Bilirubin: 0.7 mg/dL (ref 0.3–1.2)
Total Protein: 5.2 g/dL — ABNORMAL LOW (ref 6.5–8.1)

## 2019-05-31 LAB — MRSA PCR SCREENING: MRSA by PCR: NEGATIVE

## 2019-05-31 LAB — GLUCOSE, CAPILLARY
Glucose-Capillary: 88 mg/dL (ref 70–99)
Glucose-Capillary: 79 mg/dL (ref 70–99)
Glucose-Capillary: 91 mg/dL (ref 70–99)
Glucose-Capillary: 149 mg/dL — ABNORMAL HIGH (ref 70–99)

## 2019-05-31 LAB — LIPASE, BLOOD: Lipase: 22 U/L (ref 11–51)

## 2019-05-31 MED ORDER — CHLORHEXIDINE GLUCONATE CLOTH 2 % EX PADS
6.0000 | MEDICATED_PAD | Freq: Every day | CUTANEOUS | Status: DC
  Administered 2019-06-01: 6 via TOPICAL

## 2019-05-31 MED ORDER — IPRATROPIUM-ALBUTEROL 0.5-2.5 (3) MG/3ML IN SOLN
3.0000 mL | Freq: Three times a day (TID) | RESPIRATORY_TRACT | Status: DC
  Administered 2019-05-31 – 2019-06-01 (×2): 3 mL via RESPIRATORY_TRACT
  Filled 2019-05-31 (×2): qty 3

## 2019-05-31 MED ORDER — DOCUSATE SODIUM 100 MG PO CAPS
100.0000 mg | ORAL_CAPSULE | Freq: Every morning | ORAL | Status: DC
  Administered 2019-05-31 – 2019-06-07 (×8): 100 mg via ORAL
  Filled 2019-05-31 (×8): qty 1

## 2019-05-31 MED ORDER — ONDANSETRON HCL 4 MG PO TABS: 4.0000 mg | ORAL_TABLET | Freq: Four times a day (QID) | ORAL | Status: DC | PRN

## 2019-05-31 MED ORDER — CLONAZEPAM 0.5 MG PO TABS: 0.5000 mg | ORAL_TABLET | Freq: Every evening | ORAL | Status: DC | PRN

## 2019-05-31 MED ORDER — LEVOTHYROXINE SODIUM 112 MCG PO TABS: 112.0000 ug | ORAL_TABLET | Freq: Every day | ORAL | Status: DC

## 2019-05-31 MED ORDER — SENNA 8.6 MG PO TABS
1.0000 | ORAL_TABLET | Freq: Every morning | ORAL | Status: DC
  Administered 2019-05-31 – 2019-06-07 (×8): 8.6 mg via ORAL
  Filled 2019-05-31 (×8): qty 1

## 2019-05-31 MED ORDER — LEVOTHYROXINE SODIUM 112 MCG PO TABS
112.0000 ug | ORAL_TABLET | Freq: Every day | ORAL | Status: DC
  Administered 2019-05-31 – 2019-06-07 (×7): 112 ug via ORAL
  Filled 2019-05-31 (×9): qty 1

## 2019-05-31 MED ORDER — CLONAZEPAM 0.25 MG PO TBDP
0.2500 mg | ORAL_TABLET | Freq: Every day | ORAL | Status: DC | PRN
  Administered 2019-06-06: 0.25 mg via ORAL
  Filled 2019-05-31: qty 1

## 2019-05-31 MED ORDER — SODIUM CHLORIDE 0.9 % IV SOLN
1.0000 g | INTRAVENOUS | Status: DC
  Administered 2019-05-31 – 2019-06-05 (×6): 1 g via INTRAVENOUS
  Filled 2019-05-31 (×6): qty 10

## 2019-05-31 MED ORDER — ALBUMIN HUMAN 25 % IV SOLN
INTRAVENOUS | Status: AC
  Administered 2019-05-31: 12.5 g
  Filled 2019-05-31: qty 100

## 2019-05-31 MED ORDER — ONDANSETRON HCL 4 MG/2ML IJ SOLN: 4.0000 mg | Freq: Four times a day (QID) | INTRAMUSCULAR | Status: DC | PRN

## 2019-05-31 MED ORDER — MONTELUKAST SODIUM 10 MG PO TABS
10.0000 mg | ORAL_TABLET | Freq: Every day | ORAL | Status: DC
  Administered 2019-05-31 – 2019-06-06 (×7): 10 mg via ORAL
  Filled 2019-05-31 (×7): qty 1

## 2019-05-31 MED ORDER — ROSUVASTATIN CALCIUM 20 MG PO TABS: 40.0000 mg | ORAL_TABLET | Freq: Every morning | ORAL | Status: DC

## 2019-05-31 MED ORDER — ALBUTEROL SULFATE (2.5 MG/3ML) 0.083% IN NEBU: 2.5000 mg | INHALATION_SOLUTION | RESPIRATORY_TRACT | Status: DC | PRN

## 2019-05-31 MED ORDER — FLUOXETINE HCL 10 MG PO CAPS
10.0000 mg | ORAL_CAPSULE | Freq: Every day | ORAL | Status: DC
  Administered 2019-05-31 – 2019-06-07 (×8): 10 mg via ORAL
  Filled 2019-05-31 (×9): qty 1

## 2019-05-31 MED ORDER — INSULIN ASPART 100 UNIT/ML ~~LOC~~ SOLN: 0.0000 [IU] | Freq: Three times a day (TID) | SUBCUTANEOUS | Status: DC

## 2019-05-31 MED ORDER — INSULIN DETEMIR 100 UNIT/ML ~~LOC~~ SOLN
6.0000 [IU] | Freq: Every day | SUBCUTANEOUS | Status: DC
  Administered 2019-05-31 – 2019-06-05 (×5): 6 [IU] via SUBCUTANEOUS
  Filled 2019-05-31 (×7): qty 0.06

## 2019-05-31 MED ORDER — HEPARIN SODIUM (PORCINE) 1000 UNIT/ML IJ SOLN
INTRAMUSCULAR | Status: AC
  Administered 2019-05-31: 1000 [IU]
  Filled 2019-05-31: qty 4

## 2019-05-31 MED ORDER — MIDODRINE HCL 5 MG PO TABS
10.0000 mg | ORAL_TABLET | ORAL | Status: DC
  Administered 2019-06-01 – 2019-06-06 (×4): 10 mg via ORAL
  Filled 2019-05-31 (×2): qty 2

## 2019-05-31 MED ORDER — INSULIN DETEMIR 100 UNIT/ML ~~LOC~~ SOLN
10.0000 [IU] | Freq: Every day | SUBCUTANEOUS | Status: DC
  Filled 2019-05-31: qty 0.1

## 2019-05-31 MED ORDER — CHLORHEXIDINE GLUCONATE CLOTH 2 % EX PADS
6.0000 | MEDICATED_PAD | Freq: Every day | CUTANEOUS | Status: DC
  Administered 2019-06-01 – 2019-06-03 (×3): 6 via TOPICAL

## 2019-05-31 MED ORDER — SEVELAMER CARBONATE 800 MG PO TABS
800.0000 mg | ORAL_TABLET | Freq: Three times a day (TID) | ORAL | Status: DC
  Administered 2019-05-31 – 2019-06-07 (×17): 800 mg via ORAL
  Filled 2019-05-31 (×19): qty 1

## 2019-05-31 MED ORDER — MIDODRINE HCL 5 MG PO TABS
10.0000 mg | ORAL_TABLET | Freq: Once | ORAL | Status: AC
  Administered 2019-05-31: 10 mg via ORAL
  Filled 2019-05-31: qty 2

## 2019-05-31 MED ORDER — FUROSEMIDE 40 MG PO TABS: 40.0000 mg | ORAL_TABLET | ORAL | Status: DC

## 2019-05-31 MED ORDER — CHLORHEXIDINE GLUCONATE CLOTH 2 % EX PADS
6.0000 | MEDICATED_PAD | Freq: Every day | CUTANEOUS | Status: DC
  Administered 2019-05-31 – 2019-06-04 (×5): 6 via TOPICAL

## 2019-05-31 MED ORDER — METRONIDAZOLE IN NACL 5-0.79 MG/ML-% IV SOLN
500.0000 mg | Freq: Three times a day (TID) | INTRAVENOUS | Status: DC
  Administered 2019-05-31 – 2019-06-05 (×17): 500 mg via INTRAVENOUS
  Filled 2019-05-31 (×17): qty 100

## 2019-05-31 MED ORDER — ATORVASTATIN CALCIUM 40 MG PO TABS
40.0000 mg | ORAL_TABLET | Freq: Every morning | ORAL | Status: DC
  Administered 2019-05-31 – 2019-06-07 (×9): 40 mg via ORAL
  Filled 2019-05-31 (×9): qty 1

## 2019-05-31 MED ORDER — RENA-VITE PO TABS
1.0000 | ORAL_TABLET | Freq: Every day | ORAL | Status: DC
  Administered 2019-05-31 – 2019-06-06 (×7): 1 via ORAL
  Filled 2019-05-31 (×7): qty 1

## 2019-05-31 MED ORDER — PANTOPRAZOLE SODIUM 40 MG PO TBEC
40.0000 mg | DELAYED_RELEASE_TABLET | Freq: Every day | ORAL | Status: DC
  Administered 2019-05-31 – 2019-06-07 (×8): 40 mg via ORAL
  Filled 2019-05-31 (×8): qty 1

## 2019-05-31 NOTE — Progress Notes (Signed)
Patient back from dialysis. Alert and oriented x 3. No acute distress noted, no complaints. VS stable. Will continue to monitor.

## 2019-05-31 NOTE — H&P (Signed)
History and Physical    Joan Mosley VZD:[id number] DOB: November 13, 1945 DOA: 05/30/2019  PCP: Flossie Buffy, NP  Patient coming from: Home.  History obtained from patient's son.  Chief Complaint: Confusion weakness nausea vomiting.  HPI: Joan Mosley is a 74 y.o. 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  Per patient's family patient has been getting increasingly weak and confused over the last week and a half.  2 days ago patient also had couple of episodes of nausea vomiting.  Did not have any fever chills or abdominal pain.  Given the weakness confusion patient was brought to the ER.  Patient is largely bedbound.  ED Course: In the ER patient had a CT head which shows a new subdural hematoma which ER physician discussed with on-call neurosurgeon Dr. Venetia Constable who advised no surgical intervention required.  Patient's UA shows features consistent with UTI for which patient was started on antibiotics.  Since patient had nausea vomiting CT abdomen pelvis was done which shows choledocholithiasis with biliary ductal dilation some of the stones which were seen in the CT scan done last year has probably migrated the CBD.  On exam patient abdomen appears benign and patient is alert awake oriented to her name and place.  Moving all extremities generally weak.  EKG showed normal sinus rhythm Covid test negative LFTs are mildly elevated with the alkaline phosphatase of 142 bilirubin was normal.  WBC count 18.3.  Hemoglobin 9.6.  Review of Systems: As per HPI, rest all negative.   Past Medical History:  Diagnosis Date  .  Arthritis   . COPD (chronic obstructive pulmonary disease) ([place])   . Diabetic neuropathy ([place])   . 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 Renal disorder    ESRD  . Thyroid disease   . TIA (transient ischemic attack)   . Type 2 diabetes mellitus with peripheral neuropathy [facility])     Past Surgical History:  Procedure Laterality Date  . ABDOMINAL HYSTERECTOMY    . IR FLUORO GUIDE CV LINE RIGHT  11/14/2018  . RECTOCELE REPAIR    . REPLACEMENT TOTAL KNEE BILATERAL Bilateral 2008  . THYROIDECTOMY     80%  . VAGINAL WOUND CLOSURE / REPAIR       reports that she quit smoking about 38 years ago. She has a 35.00 pack-year smoking history. She has never used smokeless tobacco. She reports that she does not drink alcohol or use drugs.  Allergies  Allergen Reactions  . Avelox [Moxifloxacin Hcl In Nacl] Other (See Comments)    Unknown reaction  . Codeine Anaphylaxis  . Penicillins Rash    Did it involve swelling of the face/tongue/throat, SOB, or low BP? Unk Did it involve sudden or severe rash/hives, skin peeling, or any reaction on the inside of your mouth or nose? Yes Did you need to seek medical attention at a hospital or doctor's office? Unk When did it last happen? Unk If all above answers are "NO", may proceed with cephalosporin use.   . Sulfa Antibiotics Other (See Comments)    Unknown reaction  . Dextromethorphan-Guaifenesin Other (See Comments)    Reported by [facility] - unknown reaction.  Pt states she is not allergic to dextromethorphan-guaifenesin.  . Shellfish Allergy Hives, Itching and Rash    Family History  Problem Relation Age of Onset  . Heart disease Mother   . Hypertension Mother   . Heart disease Father   . Hypertension Father     Prior to Admission medications   Medication Sig Start Date End Date Taking? Authorizing Provider  acetaminophen (TYLENOL) 325 MG tablet Take 650 mg by mouth every 6 (six) hours as needed (for arthritic pain).    Yes [provider]  ascorbic acid (VITAMIN C) 500 MG tablet Take 1 tablet (500 mg total) by mouth daily. 02/23/19  Yes Hosie Poisson, MD  atorvastatin (LIPITOR) 40 MG tablet Take 1 tablet (40 mg total) by mouth daily. Patient taking differently: Take 40 mg by mouth in the morning.  05/16/19  Yes Medina-Vargas, Monina C, NP  b complex-vitamin c-folic acid (NEPHRO-VITE) 0.8 MG TABS tablet Take 1 tablet by mouth at bedtime.   Yes [provider]  benzonatate (TESSALON) 200 MG capsule Take 1 capsule (200 mg total) by mouth 2 (two) times daily as needed for cough. 02/08/19  Yes Nche, Charlene Brooke, NP  clonazePAM (KLONOPIN) 0.5 MG tablet Take 0.25-0.5 mg by mouth See admin instructions. Take 0.25 mg by mouth in the morning and 0.5 mg in the evening 05/20/19  Yes [provider]  FLUoxetine (PROZAC) 10 MG tablet Take 1 tablet (10 mg total) by mouth daily. 05/16/19  Yes Medina-Vargas, Monina C, NP  furosemide (LASIX) 20 MG tablet Take 40 mg by mouth See admin instructions. Take 40 mg by mouth in the morning on non-dialysis days: Sun/Mon/Wed/Fri   Yes [provider]  insulin detemir (LEVEMIR FLEXTOUCH) 100 UNIT/ML FlexPen Inject 10 Units into the skin at bedtime.   Yes [provider]  ipratropium-albuterol (DUONEB) 0.5-2.5 (3) MG/3ML SOLN Take 3 mLs by nebulization 4 (four) times daily.   Yes [provider]  levalbuterol Penne Lash HFA) 45 MCG/ACT inhaler Inhale 2 puffs into the lungs every 4 (four) hours as needed for wheezing. 05/16/19  Yes Medina-Vargas, Monina C, NP  levothyroxine (EUTHYROX) 112 MCG tablet Take 112 mcg by mouth daily before breakfast.   Yes [provider]  Lubricants (K-Y LUBRICANT JELLY SENSITIVE EX) Place 1 application into both nostrils as needed (as directed- for lubrication).    Yes [provider]  midodrine (PROAMATINE) 10 MG tablet Take 1 tablet (10 mg total) by mouth See admin instructions. Take 10 mg by mouth on Tuesday,  Thursday, Saturday 45 minutes before dialysis 05/16/19  Yes Medina-Vargas, Monina C, NP  montelukast (SINGULAIR) 10 MG tablet Take 1 tablet (10 mg total) by mouth at bedtime. 05/16/19  Yes Medina-Vargas, Monina C, NP  NON FORMULARY Inhale into the lungs at bedtime. "BiPAP IPAP 18, EPAP 5, flow rate 5": At bedtime and during the day as needed for shortness of breath when napping   Yes [provider]  Nutritional Supplements (THICK-IT MIXED FRUIT/BERRY) MISC Take 1.5 L by mouth See admin instructions. May drink up to a total of 1.5 liters a day   Yes [provider]  nystatin (MYCOSTATIN/NYSTOP) powder Apply 1 application topically 2 (two) times daily as needed (as directed- to any rashes). Patient taking differently: Apply 1 application topically 2 (two) times daily as needed (as directed, under skin folds and/or under the breasts).  05/16/19  Yes Medina-Vargas, Monina C, NP  nystatin cream (MYCOSTATIN) Apply 1 application topically 2 (two) times  daily as needed (as directed, under skin folds and/or under the breasts).   Yes [provider]  OXYGEN Inhale 5-6 L/min into the lungs continuous.    Yes [provider]  oxymetazoline (AFRIN) 0.05 % nasal spray Place 1 spray into both nostrils 2 (two) times daily as needed for congestion.   Yes [provider]  pantoprazole (PROTONIX) 40 MG tablet Take 40 mg by mouth daily before breakfast.  05/20/19  Yes [provider]  rosuvastatin (CRESTOR) 40 MG tablet Take 40 mg by mouth in the morning.   Yes [provider]  sevelamer carbonate (RENVELA) 800 MG tablet Take 1 tablet (800 mg total) by mouth 3 (three) times daily with meals. 05/16/19  Yes Medina-Vargas, Monina C, NP  SIMPLY SALINE NA Place 2 sprays into both nostrils as needed (for congestion).    Yes [provider]  trolamine salicylate (ASPER-FLEX) 10 % cream Apply 1 application topically as needed for muscle pain (to painful sites).    Yes [provider]  Darbepoetin Alfa (ARANESP) 25 MCG/0.42ML SOSY injection Inject 0.42 mLs (25 mcg total) into the vein every Tuesday with hemodialysis. 11/22/18   Hosie Poisson, MD  docusate sodium (COLACE) 100 MG capsule Take 100 mg by mouth every morning.    [provider]  doxercalciferol (HECTOROL) 4 MCG/2ML injection Inject 1.5 mLs (3 mcg total) into the vein Every Tuesday,Thursday,and Saturday with dialysis. 04/25/19   Hosie Poisson, MD  guaiFENesin (MUCINEX) 600 MG 12 hr tablet Take 1 tablet (600 mg total) by mouth 2 (two) times daily for 60 doses. Patient not taking: Reported on 05/30/2019 05/16/19 06/15/19  Medina-Vargas, Monina C, NP  levothyroxine (SYNTHROID) 112 MCG tablet Take 1 tablet (112 mcg total) by mouth daily before breakfast. 1 AM Patient not taking: Reported on 05/30/2019 05/16/19   Medina-Vargas, Monina C, NP  Lidocaine (ASPERCREME LIDOCAINE) 4 % PTCH Apply 1 patch topically daily as needed (Pain). Patient not taking: Reported on 05/30/2019 05/16/19   Medina-Vargas, Senaida Lange, NP  NON FORMULARY Renal Diet Mechanical soft Nectar thick liquids    [provider]  Nutritional Supplements (FEEDING SUPPLEMENT, NEPRO CARB STEADY,) LIQD Take 237 mLs by mouth 2 (two) times daily between meals. Patient not taking: Reported on 05/30/2019 11/22/18   Hosie Poisson, MD  senna (SENOKOT) 8.6 MG TABS tablet Take 1 tablet by mouth every morning.     [provider]  heparin 5000 UNIT/ML injection Inject 1.5 mLs (7,500 Units total) into the skin every 8 (eight) hours. 02/22/19 04/20/19  Hosie Poisson, MD    Physical Exam: Constitutional: Moderately built and nourished. Vitals:   05/30/19 2100 05/30/19 2154 05/30/19 2200 05/31/19 0015  BP: 121/68 105/60 (!) 109/59 112/60  Pulse: 88 90 92 95  Resp: (!) 23 (!) 22 19 18   Temp:  98.9 F (37.2 C)  99 F (37.2 C)  TempSrc:  Axillary  Axillary  SpO2: 100% 94%  96%  Weight:  81.6 kg     Eyes: Anicteric no  pallor. ENMT: No discharge from the ears eyes nose or mouth. Neck: No muscle.  No neck rigidity. Respiratory: No rhonchi or crepitations. Cardiovascular: S1-S2 heard. Abdomen: Soft nontender bowel sounds present. Musculoskeletal: No edema. Skin: No rash. Neurologic: Alert awake oriented to name and place.  Moves all extremities generally weak.  More weak in the lower extremity. Psychiatric: Oriented to name and place.   Labs on Admission: I have personally reviewed following labs and imaging studies  CBC: Recent  Labs  Lab 05/30/19 1325  WBC 18.3*  NEUTROABS 14.5*  HGB 11.6*  HCT 39.2  MCV 95.4  PLT 233   Basic Metabolic Panel: Recent Labs  Lab 05/30/19 1325  NA 132*  K 3.8  CL 90*  CO2 23  GLUCOSE 112*  BUN 31*  CREATININE 5.30*  CALCIUM 9.1   GFR: Estimated Creatinine Clearance: 8.7 mL/min (A) (by C-G formula based on SCr of 5.3 mg/dL (H)). Liver Function Tests: Recent Labs  Lab 05/30/19 1325  AST 31  ALT 17  ALKPHOS 142*  BILITOT 0.8  PROT 5.8*  ALBUMIN 2.5*   No results for input(s): LIPASE, AMYLASE in the last 168 hours. No results for input(s): AMMONIA in the last 168 hours. Coagulation Profile: No results for input(s): INR, PROTIME in the last 168 hours. Cardiac Enzymes: No results for input(s): CKTOTAL, CKMB, CKMBINDEX, TROPONINI in the last 168 hours. BNP (last 3 results) Recent Labs    04/10/19 1418  PROBNP 81.0   HbA1C: No results for input(s): HGBA1C in the last 72 hours. CBG: Recent Labs  Lab 05/30/19 1259 05/30/19 2156  GLUCAP 106* 89   Lipid Profile: No results for input(s): CHOL, HDL, LDLCALC, TRIG, CHOLHDL, LDLDIRECT in the last 72 hours. Thyroid Function Tests: No results for input(s): TSH, T4TOTAL, FREET4, T3FREE, THYROIDAB in the last 72 hours. Anemia Panel: No results for input(s): VITAMINB12, FOLATE, FERRITIN, TIBC, IRON, RETICCTPCT in the last 72 hours. Urine analysis:    Component Value Date/Time   COLORURINE  YELLOW 05/30/2019 1633   APPEARANCEUR TURBID (A) 05/30/2019 1633   LABSPEC 1.011 05/30/2019 1633   PHURINE 8.0 05/30/2019 1633   GLUCOSEU 150 (A) 05/30/2019 1633   HGBUR MODERATE (A) 05/30/2019 1633   BILIRUBINUR NEGATIVE 05/30/2019 1633   KETONESUR NEGATIVE 05/30/2019 1633   PROTEINUR 100 (A) 05/30/2019 1633   NITRITE NEGATIVE 05/30/2019 1633   LEUKOCYTESUR MODERATE (A) 05/30/2019 1633   Sepsis Labs: @LABRCNTIP (procalcitonin:4,lacticidven:4) ) Recent Results (from the past 240 hour(s))  Respiratory Panel by RT PCR (Flu A&B, Covid) - Nasopharyngeal Swab     Status: None   Collection Time: 05/30/19  8:48 PM   Specimen: Nasopharyngeal Swab  Result Value Ref Range Status   SARS Coronavirus 2 by RT PCR NEGATIVE NEGATIVE Final    Comment: (NOTE) SARS-CoV-2 target nucleic acids are NOT DETECTED. The SARS-CoV-2 RNA is generally detectable in upper respiratoy specimens during the acute phase of infection. The lowest concentration of SARS-CoV-2 viral copies this assay can detect is 131 copies/mL. A negative result does not preclude SARS-Cov-2 infection and should not be used as the sole basis for treatment or other patient management decisions. A negative result may occur with  improper specimen collection/handling, submission of specimen other than nasopharyngeal swab, presence of viral mutation(s) within the areas targeted by this assay, and inadequate number of viral copies (<131 copies/mL). A negative result must be combined with clinical observations, patient history, and epidemiological information. The expected result is Negative. Fact Sheet for Patients:  PinkCheek.be Fact Sheet for Healthcare Providers:  GravelBags.it This test is not yet ap proved or cleared by the [place] FDA and  has been authorized for detection and/or diagnosis of SARS-CoV-2 by FDA under an Emergency Use Authorization (EUA). This EUA will  remain  in effect (meaning this test can be used) for the duration of the COVID-19 declaration under Section 564(b)(1) of the Act, 21 U.S.C. section 360bbb-3(b)(1), unless the authorization is terminated or revoked sooner.    Influenza A  by PCR NEGATIVE NEGATIVE Final   Influenza B by PCR NEGATIVE NEGATIVE Final    Comment: (NOTE) The Xpert Xpress SARS-CoV-2/FLU/RSV assay is intended as an aid in  the diagnosis of influenza from Nasopharyngeal swab specimens and  should not be used as a sole basis for treatment. Nasal washings and  aspirates are unacceptable for Xpert Xpress SARS-CoV-2/FLU/RSV  testing. Fact Sheet for Patients: PinkCheek.be Fact Sheet for Healthcare Providers: GravelBags.it This test is not yet approved or cleared by the [place] FDA and  has been authorized for detection and/or diagnosis of SARS-CoV-2 by  FDA under an Emergency Use Authorization (EUA). This EUA will remain  in effect (meaning this test can be used) for the duration of the  Covid-19 declaration under Section 564(b)(1) of the Act, 21  U.S.C. section 360bbb-3(b)(1), unless the authorization is  terminated or revoked. Performed at [facility], [place] [address] [place], [place] [address]      Radiological Exams on Admission: CT ABDOMEN PELVIS WO CONTRAST  Result Date: 05/30/2019 CLINICAL DATA:  Nausea and vomiting EXAM: CT ABDOMEN AND PELVIS WITHOUT CONTRAST TECHNIQUE: Multidetector CT imaging of the abdomen and pelvis was performed following the standard protocol without IV contrast. COMPARISON:  CT 12/24/2018, CT chest 09/11/2018 FINDINGS: Lower chest: Lung bases demonstrate small right pleural effusion. Chronic consolidation with calcification in the right lung base. Mild bronchiectasis. Coronary vascular calcifications. Incompletely visualized dialysis catheter tip in the SVC. Hepatobiliary: Multiple calcified gallstones. Intra and  extrahepatic biliary dilatation. Numerous calcified stones in the common bile duct. Some of the intra hepatic ductal stones appear to have migrated into the common bile duct. Additional suspected vascular calcifications within the liver. Pancreas: Unremarkable. No pancreatic ductal dilatation or surrounding inflammatory changes. Spleen: Normal in size without focal abnormality. Adrenals/Urinary Tract: Adrenal glands are normal. Atrophic kidneys without hydronephrosis. Stable 1 cm hyperdense lesion lower pole left kidney. The bladder is slightly thick walled in appearance. Stomach/Bowel: Stomach nonenlarged. No dilated small bowel. Mild wall thickening of the rectum. 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 Vascular/Lymphatic: Extensive aortic atherosclerosis without aneurysm. No suspicious adenopathy Reproductive: Status post hysterectomy. Multiple cystic densities within the right greater than left ovary without significant change. Scattered parenchymal calcification. Other: Negative for free air or free fluid. Musculoskeletal: Degenerative changes throughout the spine. No acute or suspicious osseous abnormality. IMPRESSION: 1. Multiple calcified gallstones. Choledocholithiasis. Some of the previously noted intra hepatic biliary stones have migrated into the distal common bile duct. Degree of biliary dilatation does not appear significantly changed. 2. 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 Rectal wall thickening which could be secondary to inflammatory process, though would correlate with direct inspection to exclude mass. 3. Atrophic kidneys without hydronephrosis. Stable 1 cm indeterminate hyperdense lesion lower pole left kidney. 4. Small right pleural effusion. Chronic consolidation with calcification in the right lung base. Mild bronchiectasis. 5. Aortic atherosclerosis. 6. Multiple cystic densities within the bilateral ovaries for which nonemergent  pelvic ultrasound was previously suggest Aortic Atherosclerosis (ICD10-I70.0). Electronically Signed   By: Donavan Foil M.D.   On: 05/30/2019 19:21   DG Chest 2 View  Result Date: 05/30/2019 CLINICAL DATA:  Altered mental status. EXAM: CHEST - 2 VIEW COMPARISON:  05/04/2019. FINDINGS: Dual-lumen catheter noted with tip over SVC in stable position. Heart size stable. Stable atelectatic changes and/or infiltrates both lung bases. Bilateral interstitial prominence again noted. Stable small right pleural effusion. No pneumothorax. No acute bony abnormality identified. Diffuse osteopenia degenerative  change thoracic spine. IMPRESSION: 1.  Dual-lumen catheter stable position. 2. Stable atelectatic changes and/or infiltrate both lung bases. Bilateral interstitial prominence again noted. Small stable small right pleural effusion. Chest is unchanged from prior exam. Electronically Signed   By: Marcello Moores  Register   On: 05/30/2019 14:25   CT Head Wo Contrast  Result Date: 05/30/2019 CLINICAL DATA:  Encephalopathy lethargy EXAM: CT HEAD WITHOUT CONTRAST TECHNIQUE: Contiguous axial images were obtained from the base of the skull through the vertex without intravenous contrast. COMPARISON:  MRI 05/04/2019, CT brain 04/19/2019 FINDINGS: Brain: No acute territorial infarction or intracranial mass is visualized. Thin bilateral anterior convexity subdural hematomas measuring 3 mm, appear isodense without definitive hyperdensity but new since MRI 05/04/2019. Moderate atrophy. Extensive hypodensity in the white matter consistent with chronic small vessel ischemic change. Vascular: No hyperdense vessels.  Carotid vascular calcification Skull: Normal. Negative for fracture or focal lesion. Sinuses/Orbits: No acute finding. Other: None IMPRESSION: 1. New thin anterior convexity subdural hematomas measuring up to 3 mm maximum thickness without significant mass effect. These are probably subacute but are new since MRI 05/04/2019. 2.  Atrophy and extensive chronic small vessel ischemic change of the white matter. Critical Value/emergent results were called by telephone at the time of interpretation on 05/30/2019 at 4:04 pm to provider [facility] , who verbally acknowledged these results. Electronically Signed   By: Donavan Foil M.D.   On: 05/30/2019 16:05    EKG: Independently reviewed.  Normal sinus rhythm.  Assessment/Plan Principal Problem:   Acute encephalopathy Active Problems:   Subdural hematoma ([facility])   ESRD on dialysis ([place])   Type II diabetes mellitus with renal manifestations ([facility])   COPD (chronic obstructive pulmonary disease) ([facility])   Urinary tract infection without hematuria    1. Acute encephalopathy and generalized weakness likely could be from UTI for which patient is on empiric antibiotics follow cultures. 2. Subdural hematoma appears to be new and as per neurosurgeon Dr. Venetia Constable with home ER physician discussed no intervention required.  I have ordered a repeat CT head from morning to see the progress. 3. Nausea vomiting with choledocholithiasis.  LFTs are not showing any elevation of bilirubin.  Patient is afebrile.  Does have leukocytosis.  Patient's CAT scan done last year did show choledocholithiasis.  At this time it shows some stones are migrated to CBD.  We will repeat LFTs and if there is any worsening of LFTs or if that is persistent nausea vomiting may need MRCP and GI consult.  Presently on empiric antibiotics. 4. ESRD on hemodialysis Monday Wednesday Friday consult nephrology. 5. Diabetes mellitus type 2 on Levemir.  Sliding scale coverage. 6. Anemia likely from renal disease follow CBC. 7. History of stroke presently only any antiplatelet agents due to subdural hematoma. 8. COPD not actively wheezing.  Given acute encephalopathy.  UTI with nausea vomiting CBD stones will need inpatient status.   DVT prophylaxis: Avoiding pharmacological anticoagulation due to subdural  hematoma. Code Status: Full code. Family Communication: Patient's son. Disposition Plan: To be determined. Consults called: None. Admission status: Inpatient.   Rise Patience MD [facility] Pager [phone].  If 7PM-7AM, please contact night-coverage [email or web address] Password [email or web address]  05/31/2019, 2:45 AM

## 2019-05-31 NOTE — TOC Initial Note (Signed)
Transition of Care [facility]) - Initial/Assessment Note    Patient Details  Name: Joan Mosley MRN: [id number] Date of Birth: 1945-05-07  [facility] CM/SW Contact:    Benard Halsted, LCSW Phone Number: 05/31/2019, 5:45 PM  Clinical Narrative:                 CSW notified by RN that patient's son, [name], wanted to speak with CSW. CSW contacted [name], who lives out of state. [name] reported that he is patient's healthcare POA (as he is a paramedic) and his brother, [name], has patient's financial POA. He reports that patient lives with his sister and brother, [name] and [name] here in [place]. Ryan expressed concern that his brother [name] is wanting to send patient to a nursing facility near him in [place] and the patient does not want to go to a facility. She recently discharged from [facility] does not want her to return there due to concerns with her dialysis transportation. Ryan requested CSW to speak with the patient to see if she can clarify her wishes as it is his understanding that she wants to remain with [name] and [employer]. He states he also expects palliative to be consulted regarding patient's code status. He states he would likely have to be present in order for patient to agree to be a DNR again. CSW will follow up with patient as she is documented as alert and oriented x4. Of note, patient does not yet have Medicaid in place and therefore would be in out of pocket copays for SNF.      Barriers to Discharge: Continued Medical Work up   Patient Goals and CMS Choice        Expected Discharge Plan and Services   In-house Referral: Clinical Social Work     Living arrangements for the past 2 months: Single Family Home                                      Prior Living Arrangements/Services Living arrangements for the past 2 months: Single Family Home Lives with:: Adult Children Patient language and need for interpreter reviewed:: Yes Do you feel safe going  back to the place where you live?: Yes      Need for Family Participation in Patient Care: Yes (Comment) Care giver support system in place?: Yes (comment) Current home services: DME Criminal Activity/Legal Involvement Pertinent to Current Situation/Hospitalization: No - Comment as needed  Activities of Daily Living      Permission Sought/Granted Permission sought to share information with : Facility Sport and exercise psychologist, Family Supports Permission granted to share information with : Yes, Verbal Permission Granted        Permission granted to share info w Relationship: Sons and daughter     Emotional Assessment Appearance:: Appears stated age     Orientation: : Oriented to Self, Oriented to Place, Oriented to  Time, Oriented to Situation Alcohol / Substance Use: Not Applicable Psych Involvement: No (comment)  Admission diagnosis:  Acute encephalopathy [G93.40] Urinary tract infection without hematuria, site unspecified [N39.0] Altered mental status, unspecified altered mental status type [R41.82] Patient Active Problem List   Diagnosis Date Noted  . Urinary tract infection without hematuria 05/31/2019  . Symptomatic anemia 05/04/2019  . Neurocognitive deficits 04/27/2019  . Adult failure to thrive   . Acute encephalopathy 04/20/2019  . Stroke ([place]) 04/20/2019  . Dysphagia 03/29/2019  .  Aspiration into airway   . Pneumonia due to COVID-19 virus 02/09/2019  . Hypotension 12/24/2018  . Aspiration pneumonia of right lower lobe ([place])   . COPD (chronic obstructive pulmonary disease) ([place]) 11/11/2018  . TIA (transient ischemic attack)   . HLD (hyperlipidemia)   . Type II diabetes mellitus with renal manifestations ([place])   . Depression with anxiety   . Advanced care planning/counseling discussion   . Goals of care, counseling/discussion   . Palliative care by specialist   . Pulmonary edema 09/08/2018  . Respiratory failure ([place]) 09/08/2018  . Weakness   . Chronic  respiratory failure with hypoxia, on home O2 therapy ([place]) 09/06/2018  . Subdural hematoma ([place]) 08/22/2018  . Elevated troponin 08/22/2018  . ESRD on dialysis ([place]) 08/22/2018  . Anxiety 07/04/2018  . Counseling regarding advanced directives and goals of care 07/01/2018  . Full code status 07/01/2018  . Drug-induced constipation 07/01/2018  . Hemodialysis-associated hypotension 07/01/2018  . Dialysis patient ([place]) 07/01/2018  . HOH (hard of hearing) 07/01/2018  . Stage 5 chronic kidney disease on chronic dialysis ([place]) 07/01/2018  . Type 2 diabetes mellitus with diabetic polyneuropathy, with long-term current use of insulin ([place]) 07/01/2018  . Hypothyroidism 07/01/2018  . Gait instability 07/01/2018  . Hyperkalemia 04/20/2018  . Cerebral infarction, unspecified ([place]) 04/05/2018  . Chronic diastolic (congestive) heart failure ([place]) 04/05/2018  . Diarrhea, unspecified 04/05/2018  . Pain, unspecified 04/05/2018  . Pruritus, unspecified 04/05/2018  . Shortness of breath 04/05/2018  . Coagulation defect, unspecified ([place]) 03/31/2018  . Anemia in chronic kidney disease 03/25/2018  . Iron deficiency anemia, unspecified 03/25/2018  . Secondary hyperparathyroidism of renal origin ([place]) 03/25/2018   PCP:  Flossie Buffy, NP Pharmacy:   [facility] ([facility] [place] [address] [place] [address] Phone: [phone] Fax: [phone]     Social Determinants of Health (SDOH) Interventions    Readmission Risk Interventions Readmission Risk Prevention Plan 05/31/2019 10/04/2018  Transportation Screening Complete Complete  Medication Review (RN Care Manager) Complete Complete  PCP or Specialist appointment within 3-5 days of discharge Complete Complete  HRI or [place] Complete Complete  SW Recovery Care/Counseling Consult Complete Complete  Palliative Care Screening Not Applicable Complete  [place]  Patient Refused Patient Refused

## 2019-05-31 NOTE — Consult Note (Signed)
[facility] Consult: 9:11 AM 05/31/2019  LOS: 0 days    Referring Provider: Dr Nena Alexander Primary Care Physician:  Flossie Buffy, NP Primary Gastroenterologist:  Dr. Havery Moros, seen as inpt consult 05/05/19.      Reason for Consultation:  Choledocholithiasis.     HPI: Joan Mosley is a 74 y.o. female.   PMH ESRD, TTS hemodialysis.  IDDM.  Chronic respiratory failure, chronic 5 L home oxygen, nocturnal bipap.  diastolic CHF.  Hypothyroidism, previous thyroidectomy. TIA.  Macrocytic anemia.  Dysphagia.  Morbid obesity.  Acute on chronic SDH.  Nonambulatory/electric wheelchair mobile at best, total dependence for ADLs. Colonoscopy, EGD probably 7 or more years ago while living in [place]. 7 or 8 hospitalizations from 07/2018 thru 12/2018 due to respiratory issues, acute on chronic subdural hematomas from falls, sepsis, pneumonia.  Discharged to [facility] in October. 02/09/19 - 02/22/19 hospital admission with COVID-19 pneumonia. Pt family declined suggested LTAC placement and wanted patient full code. 3/24 - 3/30 admission w small acute CVA.  Discharged home.  12/24/2018 CTAP without contrast showed gallstones, moderate intra and extrahepatic biliary ductal dilatation and suggestion of small calcifications at ampulla.  Rectal wall thickening with stranding in the left ischio anal fat, question proctitis vs perianal abscess.  LFTs normal and no GI evaluation sought.  Admission 4/8 - 05/08/19 (her 3rd of 4 admissions in 2021), discharged to SNF but eventually returned home.    Inpatient consult to 05/05/2019 for anemia/Hb 4.7, FOBT negative.  Previous baseline 10.1 and MCV ~ 101 to 103 on 04/22/2019.  No iron, b12, folate deficiency.  Hgb rose to 13.2 after  3 PRBCs, much higher than would be expected.  No hx  melenic, bloody stools.  Occasional nonbloody emesis.  Tolerating thickened liquids, soft diet prescribed for dysphagia. Given the high risk for sedation and endoscopic procedures, Dr. Havery Moros did not pursue EGD or colonoscopy in light of no overt GI bleeding.  Patient now readmitted with encephalopathy, nausea, vomiting.  Mental status changes present for about 10 days and 2 days ago had episodes of nonbloody nausea vomiting but pt says nausea not a big issue.  Some discomfort in R mid, lower belly, not severe.   CT head repeated and shows new (compared with MRI 05/04/2019) SDH, neurosurgery advises no intervention.   CTAP w/o contrast shows multiple gallstones.  Choledocholithiasis, some of which have migrated to distal CBD.  No change in degree of intra and extrahepatic ductal dilatation.  Suspected vascular calcifications in the liver. t bili 0.8.  Alk phos 142.  AST/ALT 31/17. Albumin 2.2.  LFTs normal on prior admit.   Hgb 10.5.  Platelets 130.  WBCs 18.3.  UTI per U/A w lots bacteria, 11-20 RBCs, > 50 WBCs, moderate leukocytes. Urine clx pending.   Day 2 Rocephin, day 1 Flagyl.        Past Medical History:  Diagnosis Date  . Arthritis   . COPD (chronic obstructive pulmonary disease) ([place])   . Diabetic neuropathy ([place])   . Oxygen dependent 03/30/2018   5L  home O2  . Renal disorder    ESRD  . Thyroid disease   . TIA (transient ischemic attack)   . Type 2 diabetes mellitus with peripheral neuropathy [facility])     Past Surgical History:  Procedure Laterality Date  . ABDOMINAL HYSTERECTOMY    . IR FLUORO GUIDE CV LINE RIGHT  11/14/2018  . RECTOCELE REPAIR    . REPLACEMENT TOTAL KNEE BILATERAL Bilateral 2008  . THYROIDECTOMY     80%  . VAGINAL WOUND CLOSURE / REPAIR      Prior to Admission medications   Medication Sig Start Date End Date Taking? Authorizing Provider  acetaminophen (TYLENOL) 325 MG tablet Take 650 mg by mouth every 6 (six) hours as needed (for arthritic pain).     Yes [provider]  ascorbic acid (VITAMIN C) 500 MG tablet Take 1 tablet (500 mg total) by mouth daily. 02/23/19  Yes Hosie Poisson, MD  atorvastatin (LIPITOR) 40 MG tablet Take 1 tablet (40 mg total) by mouth daily. Patient taking differently: Take 40 mg by mouth in the morning.  05/16/19  Yes Medina-Vargas, Monina C, NP  b complex-vitamin c-folic acid (NEPHRO-VITE) 0.8 MG TABS tablet Take 1 tablet by mouth at bedtime.   Yes [provider]  benzonatate (TESSALON) 200 MG capsule Take 1 capsule (200 mg total) by mouth 2 (two) times daily as needed for cough. 02/08/19  Yes Nche, Charlene Brooke, NP  clonazePAM (KLONOPIN) 0.5 MG tablet Take 0.25-0.5 mg by mouth See admin instructions. Take 0.25 mg by mouth in the morning and 0.5 mg in the evening 05/20/19  Yes [provider]  FLUoxetine (PROZAC) 10 MG tablet Take 1 tablet (10 mg total) by mouth daily. 05/16/19  Yes Medina-Vargas, Monina C, NP  furosemide (LASIX) 20 MG tablet Take 40 mg by mouth See admin instructions. Take 40 mg by mouth in the morning on non-dialysis days: Sun/Mon/Wed/Fri   Yes [provider]  insulin detemir (LEVEMIR FLEXTOUCH) 100 UNIT/ML FlexPen Inject 10 Units into the skin at bedtime.   Yes [provider]  ipratropium-albuterol (DUONEB) 0.5-2.5 (3) MG/3ML SOLN Take 3 mLs by nebulization 4 (four) times daily.   Yes [provider]  levalbuterol Penne Lash HFA) 45 MCG/ACT inhaler Inhale 2 puffs into the lungs every 4 (four) hours as needed for wheezing. 05/16/19  Yes Medina-Vargas, Monina C, NP  levothyroxine (EUTHYROX) 112 MCG tablet Take 112 mcg by mouth daily before breakfast.   Yes [provider]  Lubricants (K-Y LUBRICANT JELLY SENSITIVE EX) Place 1 application into both nostrils as needed (as directed- for lubrication).    Yes [provider]  midodrine (PROAMATINE) 10 MG tablet Take 1 tablet (10 mg total) by mouth See admin instructions. Take 10 mg by mouth on  Tuesday, Thursday, Saturday 45 minutes before dialysis 05/16/19  Yes Medina-Vargas, Monina C, NP  montelukast (SINGULAIR) 10 MG tablet Take 1 tablet (10 mg total) by mouth at bedtime. 05/16/19  Yes Medina-Vargas, Monina C, NP  NON FORMULARY Inhale into the lungs at bedtime. "BiPAP IPAP 18, EPAP 5, flow rate 5": At bedtime and during the day as needed for shortness of breath when napping   Yes [provider]  Nutritional Supplements (THICK-IT MIXED FRUIT/BERRY) MISC Take 1.5 L by mouth See admin instructions. May drink up to a total of 1.5 liters a day   Yes [provider]  nystatin (MYCOSTATIN/NYSTOP) powder Apply 1 application topically 2 (two) times daily as needed (as directed- to any rashes).  Patient taking differently: Apply 1 application topically 2 (two) times daily as needed (as directed, under skin folds and/or under the breasts).  05/16/19  Yes Medina-Vargas, Monina C, NP  nystatin cream (MYCOSTATIN) Apply 1 application topically 2 (two) times daily as needed (as directed, under skin folds and/or under the breasts).   Yes [provider]  OXYGEN Inhale 5-6 L/min into the lungs continuous.    Yes [provider]  oxymetazoline (AFRIN) 0.05 % nasal spray Place 1 spray into both nostrils 2 (two) times daily as needed for congestion.   Yes [provider]  pantoprazole (PROTONIX) 40 MG tablet Take 40 mg by mouth daily before breakfast.  05/20/19  Yes [provider]  rosuvastatin (CRESTOR) 40 MG tablet Take 40 mg by mouth in the morning.   Yes [provider]  sevelamer carbonate (RENVELA) 800 MG tablet Take 1 tablet (800 mg total) by mouth 3 (three) times daily with meals. 05/16/19  Yes Medina-Vargas, Monina C, NP  SIMPLY SALINE NA Place 2 sprays into both nostrils as needed (for congestion).    Yes [provider]  trolamine salicylate (ASPER-FLEX) 10 % cream Apply 1 application topically as needed for muscle pain (to painful  sites).   Yes [provider]  Darbepoetin Alfa (ARANESP) 25 MCG/0.42ML SOSY injection Inject 0.42 mLs (25 mcg total) into the vein every Tuesday with hemodialysis. 11/22/18   Hosie Poisson, MD  docusate sodium (COLACE) 100 MG capsule Take 100 mg by mouth every morning.    [provider]  doxercalciferol (HECTOROL) 4 MCG/2ML injection Inject 1.5 mLs (3 mcg total) into the vein Every Tuesday,Thursday,and Saturday with dialysis. 04/25/19   Hosie Poisson, MD  guaiFENesin (MUCINEX) 600 MG 12 hr tablet Take 1 tablet (600 mg total) by mouth 2 (two) times daily for 60 doses. Patient not taking: Reported on 05/30/2019 05/16/19 06/15/19  Medina-Vargas, Monina C, NP  levothyroxine (SYNTHROID) 112 MCG tablet Take 1 tablet (112 mcg total) by mouth daily before breakfast. 1 AM Patient not taking: Reported on 05/30/2019 05/16/19   Medina-Vargas, Monina C, NP  Lidocaine (ASPERCREME LIDOCAINE) 4 % PTCH Apply 1 patch topically daily as needed (Pain). Patient not taking: Reported on 05/30/2019 05/16/19   Medina-Vargas, Senaida Lange, NP  NON FORMULARY Renal Diet Mechanical soft Nectar thick liquids    [provider]  Nutritional Supplements (FEEDING SUPPLEMENT, NEPRO CARB STEADY,) LIQD Take 237 mLs by mouth 2 (two) times daily between meals. Patient not taking: Reported on 05/30/2019 11/22/18   Hosie Poisson, MD  senna (SENOKOT) 8.6 MG TABS tablet Take 1 tablet by mouth every morning.     [provider]  heparin 5000 UNIT/ML injection Inject 1.5 mLs (7,500 Units total) into the skin every 8 (eight) hours. 02/22/19 04/20/19  Hosie Poisson, MD    Scheduled Meds: . atorvastatin  40 mg Oral q AM  . Chlorhexidine Gluconate Cloth  6 each Topical Daily  . docusate sodium  100 mg Oral q morning - 10a  . FLUoxetine  10 mg Oral Daily  . insulin aspart  0-6 Units Subcutaneous TID WC  . insulin detemir  10 Units Subcutaneous QHS  . levothyroxine  112 mcg Oral Q0600  . [START ON 06/01/2019] midodrine  10  mg Oral Q T,Th,S,Su  . montelukast  10 mg Oral QHS  . multivitamin  1 tablet Oral QHS  . pantoprazole  40 mg Oral QAC breakfast  . senna  1 tablet Oral q morning - 10a  .  sevelamer carbonate  800 mg Oral TID WC   Infusions: . cefTRIAXone (ROCEPHIN)  IV 1 g (05/31/19 0504)  . metronidazole 500 mg (05/31/19 0831)   PRN Meds: albuterol, clonazePAM, clonazePAM, ondansetron **OR** ondansetron (ZOFRAN) IV   Allergies as of 05/30/2019 - Review Complete 05/30/2019  Allergen Reaction Noted  . Avelox [moxifloxacin hcl in nacl] Other (See Comments) 03/30/2018  . Codeine Anaphylaxis 03/30/2018  . Penicillins Rash 03/30/2018  . Sulfa antibiotics Other (See Comments) 03/30/2018  . Dextromethorphan-guaifenesin Other (See Comments) 11/11/2018  . Shellfish allergy Hives, Itching, and Rash 07/01/2018    Family History  Problem Relation Age of Onset  . Heart disease Mother   . Hypertension Mother   . Heart disease Father   . Hypertension Father     Social History   Socioeconomic History  . Marital status: Widowed    Spouse name: Not on file  . Number of children: 5  . Years of education: Not on file  . Highest education level: Not on file  Occupational History    Comment: disabled  Tobacco Use  . Smoking status: Former Smoker    Packs/day: 1.00    Years: 35.00    Pack years: 35.00    Quit date: 03/29/1981    Years since quitting: 38.1  . Smokeless tobacco: Never Used  Substance and Sexual Activity  . Alcohol use: Never  . Drug use: Never  . Sexual activity: Not Currently  Other Topics Concern  . Not on file  Social History Narrative  . Not on file   Social Determinants of Health   Financial Resource Strain:   . Difficulty of Paying Living Expenses:   Food Insecurity:   . Worried About Charity fundraiser in the Last Year:   . Arboriculturist in the Last Year:   Transportation Needs:   . Film/video editor (Medical):   Marland Kitchen Lack of Transportation (Non-Medical):     Physical Activity:   . Days of Exercise per Week:   . Minutes of Exercise per Session:   Stress:   . Feeling of Stress :   Social Connections:   . Frequency of Communication with Friends and Family:   . Frequency of Social Gatherings with Friends and Family:   . Attends Religious Services:   . Active Member of Clubs or Organizations:   . Attends Archivist Meetings:   Marland Kitchen Marital Status:   Intimate Partner Violence:   . Fear of Current or Ex-Partner:   . Emotionally Abused:   Marland Kitchen Physically Abused:   . Sexually Abused:     REVIEW OF SYSTEMS: Constitutional:  See HPI.  Bed ridden ENT:  No nose bleeds Pulm:  Denies acute SOB CV:  No palpitations, no LE edema.  GU:  No hematuria, no frequency GI:  Per HPI Heme:  No unusual or excessive bleeding   Transfusions:  Per HPI Neuro:  No headaches, no peripheral tingling or numbness Derm:  No itching, no rash or sores.  Endocrine:  No sweats or chills.  No polyuria or dysuria Immunization:  Reviewed.   Travel:  None beyond local counties in last few months.    PHYSICAL EXAM: Vital signs in last 24 hours: Vitals:   05/31/19 0400 05/31/19 0800  BP: (!) 90/57 105/63  Pulse: 87 86  Resp: 17 18  Temp: 98.7 F (37.1 C)   SpO2: 96% 98%   Wt Readings from Last 3 Encounters:  05/30/19 81.6 kg  05/16/19 84.3  kg  05/11/19 84.3 kg    General: obese, pale, chronically ill/unwell appearance.  No jaundic3 Head:  No asymmetry or signs of head trauma  Eyes:  No icterus, no conj pallor Ears:  HOH  Nose:  No discharge Mouth:  Non-specific white/beige coating on tongue.  1 or 2 teeth in mouth. Neck:  No mass Lungs:  No cough or dyspnea. Clear anterior exam  Heart: RRR.  No MRG Abdomen:  Obese, soft, NT, ND.  BS hypoactive.  No mass or HSM appreciated.   Rectal: not preformed   Musc/Skeltl: no joint redness Extremities:  + non-pitting LE edema  Neurologic:  Oriented to year, "hospital" but poor recall of health details,  paucity of speech.  Moves all 4 limbs.  Tremors vs mild asterixis in hands.   Skin:  No rash, sores.   Tattoos:  none Nodes:  No cervical adenopathy   Psych:  Bland, blunted affect.  Torpor but arouseable for short periods of time.    Intake/Output from previous day: 05/04 0701 - 05/05 0700 In: 100 [IV Piggyback:100] Out: -  Intake/Output this shift: Total I/O In: 100 [IV Piggyback:100] Out: -   LAB RESULTS: Recent Labs    05/30/19 1325 05/31/19 0520  WBC 18.3* 16.4*  HGB 11.6* 10.5*  HCT 39.2 34.3*  PLT 152 130*   BMET Lab Results  Component Value Date   NA 131 (L) 05/31/2019   NA 132 (L) 05/30/2019   NA 139 05/12/2019   K 3.5 05/31/2019   K 3.8 05/30/2019   K 3.8 05/12/2019   CL 92 (L) 05/31/2019   CL 90 (L) 05/30/2019   CL 95 (A) 05/12/2019   CO2 21 (L) 05/31/2019   CO2 23 05/30/2019   CO2 30 (A) 05/12/2019   GLUCOSE 91 05/31/2019   GLUCOSE 112 (H) 05/30/2019   GLUCOSE 110 (H) 05/08/2019   BUN 37 (H) 05/31/2019   BUN 31 (H) 05/30/2019   BUN 23 (A) 05/12/2019   CREATININE 5.78 (H) 05/31/2019   CREATININE 5.30 (H) 05/30/2019   CREATININE 4.5 (A) 05/12/2019   CALCIUM 8.9 05/31/2019   CALCIUM 9.1 05/30/2019   CALCIUM 9.9 05/12/2019   LFT Recent Labs    05/30/19 1325  PROT 5.8*  ALBUMIN 2.5*  AST 31  ALT 17  ALKPHOS 142*  BILITOT 0.8   PT/INR Lab Results  Component Value Date   INR 1.0 05/04/2019   INR 1.0 04/15/2019   INR 1.1 09/08/2018   Hepatitis Panel No results for input(s): HEPBSAG, HCVAB, HEPAIGM, HEPBIGM in the last 72 hours. C-Diff No components found for: CDIFF Lipase     Component Value Date/Time   LIPASE 40 12/24/2018 1654    Drugs of Abuse  No results found for: LABOPIA, COCAINSCRNUR, LABBENZ, AMPHETMU, THCU, LABBARB   RADIOLOGY STUDIES: CT ABDOMEN PELVIS WO CONTRAST  Result Date: 05/30/2019 CLINICAL DATA:  Nausea and vomiting EXAM: CT ABDOMEN AND PELVIS WITHOUT CONTRAST TECHNIQUE: Multidetector CT imaging of the abdomen  and pelvis was performed following the standard protocol without IV contrast. COMPARISON:  CT 12/24/2018, CT chest 09/11/2018 FINDINGS: Lower chest: Lung bases demonstrate small right pleural effusion. Chronic consolidation with calcification in the right lung base. Mild bronchiectasis. Coronary vascular calcifications. Incompletely visualized dialysis catheter tip in the SVC. Hepatobiliary: Multiple calcified gallstones. Intra and extrahepatic biliary dilatation. Numerous calcified stones in the common bile duct. Some of the intra hepatic ductal stones appear to have migrated into the common bile duct. Additional suspected vascular calcifications within  the liver. Pancreas: Unremarkable. No pancreatic ductal dilatation or surrounding inflammatory changes. Spleen: Normal in size without focal abnormality. Adrenals/Urinary Tract: Adrenal glands are normal. Atrophic kidneys without hydronephrosis. Stable 1 cm hyperdense lesion lower pole left kidney. The bladder is slightly thick walled in appearance. Stomach/Bowel: Stomach nonenlarged. No dilated small bowel. Mild wall thickening of the rectum. 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 Vascular/Lymphatic: Extensive aortic atherosclerosis without aneurysm. No suspicious adenopathy Reproductive: Status post hysterectomy. Multiple cystic densities within the right greater than left ovary without significant change. Scattered parenchymal calcification. Other: Negative for free air or free fluid. Musculoskeletal: Degenerative changes throughout the spine. No acute or suspicious osseous abnormality. IMPRESSION: 1. Multiple calcified gallstones. Choledocholithiasis. Some of the previously noted intra hepatic biliary stones have migrated into the distal common bile duct. Degree of biliary dilatation does not appear significantly changed. 2. 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 Rectal wall thickening which could be  secondary to inflammatory process, though would correlate with direct inspection to exclude mass. 3. Atrophic kidneys without hydronephrosis. Stable 1 cm indeterminate hyperdense lesion lower pole left kidney. 4. Small right pleural effusion. Chronic consolidation with calcification in the right lung base. Mild bronchiectasis. 5. Aortic atherosclerosis. 6. Multiple cystic densities within the bilateral ovaries for which nonemergent pelvic ultrasound was previously suggest Aortic Atherosclerosis (ICD10-I70.0). Electronically Signed   By: Donavan Foil M.D.   On: 05/30/2019 19:21   DG Chest 2 View  Result Date: 05/30/2019 CLINICAL DATA:  Altered mental status. EXAM: CHEST - 2 VIEW COMPARISON:  05/04/2019. FINDINGS: Dual-lumen catheter noted with tip over SVC in stable position. Heart size stable. Stable atelectatic changes and/or infiltrates both lung bases. Bilateral interstitial prominence again noted. Stable small right pleural effusion. No pneumothorax. No acute bony abnormality identified. Diffuse osteopenia degenerative change thoracic spine. IMPRESSION: 1.  Dual-lumen catheter stable position. 2. Stable atelectatic changes and/or infiltrate both lung bases. Bilateral interstitial prominence again noted. Small stable small right pleural effusion. Chest is unchanged from prior exam. Electronically Signed   By: Marcello Moores  Register   On: 05/30/2019 14:25   CT Head Wo Contrast  Result Date: 05/30/2019 CLINICAL DATA:  Encephalopathy lethargy EXAM: CT HEAD WITHOUT CONTRAST TECHNIQUE: Contiguous axial images were obtained from the base of the skull through the vertex without intravenous contrast. COMPARISON:  MRI 05/04/2019, CT brain 04/19/2019 FINDINGS: Brain: No acute territorial infarction or intracranial mass is visualized. Thin bilateral anterior convexity subdural hematomas measuring 3 mm, appear isodense without definitive hyperdensity but new since MRI 05/04/2019. Moderate atrophy. Extensive hypodensity in  the white matter consistent with chronic small vessel ischemic change. Vascular: No hyperdense vessels.  Carotid vascular calcification Skull: Normal. Negative for fracture or focal lesion. Sinuses/Orbits: No acute finding. Other: None IMPRESSION: 1. New thin anterior convexity subdural hematomas measuring up to 3 mm maximum thickness without significant mass effect. These are probably subacute but are new since MRI 05/04/2019. 2. Atrophy and extensive chronic small vessel ischemic change of the white matter. Critical Value/emergent results were called by telephone at the time of interpretation on 05/30/2019 at 4:04 pm to provider [facility] , who verbally acknowledged these results. Electronically Signed   By: Donavan Foil M.D.   On: 05/30/2019 16:05      IMPRESSION:   *   Choledocholithiasis.  Appears to have migrated to distal CBD but degree of intr/extrahepatic ductal dilatation stable c/w 01/2019.  Now LFTs elevated.   *   UTI.  On Rocephin, Flagyl  *  N/V, AMS ? Due to CBD stones or UTI  *  Anemia.  Normocytic.    *   Multiple serious medical problems and high hospitalization rate.   ESRD, SDHs,     PLAN:     *   Doubt she will be able to participate for MRCP/MR abdomen.  ? ERCP?  Dr Tarri Glenn will see pt later.      Azucena Freed  05/31/2019, 9:11 AM Phone [phone]

## 2019-05-31 NOTE — Progress Notes (Addendum)
PROGRESS NOTE        PATIENT DETAILS Name: Joan Mosley Age: 74 y.o. Sex: female Date of Birth: [birth year] Admit Date: 05/30/2019 Admitting Physician Rise Patience, MD UJW:JXBJ, Charlene Brooke, NP  Brief Narrative: Patient is a 74 y.o. 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  See below for further details.  Significant events: 5/4>> admit to [facility] for confusion-found to have SDH, choledocholithiasis, UTI 4/8-4/12>> admit to [facility] for symptomatic anemia requiring PRBC transfusion  Antimicrobial therapy: Rocephin: 5/4>> Flagyl: 5/5>>  Microbiology data: 5/4: Urine culture>> gram-negative rods  Procedures/significant studies: 5/4: CT abdomen/pelvis>> choledocholithiasis, diverticular disease, multiple ovarian cysts  Consults: GI, nephrology  DVT Prophylaxis : SCD's  Subjective: Awake-and mostly alert this morning.  Denies any headache.  Assessment/Plan: Acute metabolic encephalopathy: Improved-suspect not far from baseline-no family at bedside.  Likely metabolic encephalopathy in the setting of UTI, SDH.  SDH: No focal deficits-repeat CT head on 5/5 shows stability.  ED MD on admission had spoken with neurosurgeon-Dr. Ricarda Frame advised no surgical intervention.  Choledocholithiasis: Some nausea and vomiting-however lipase within normal limits.  GI consulted-with recommendations to proceed with ERCP when a bit more stable.  Remains on empiric Rocephin/Flagyl-but no obvious signs of cholangitis.  Complicated UTI: Poor historian-does acknowledge intermittent dysuria-UA grossly abnormal-has leukocytosis-continue IV Rocephin-await cultures.  ESRD on HD MWF: Nephrology  consulted for inpatient HD care.  COPD with chronic hypoxic respiratory failure on 5 L of oxygen/OSA: No wheezing-supportive care-continue bronchodilators.  Continue CPAP nightly  History of recent CVA: Nonfocal exam-antiplatelets contraindicated due to SDH.  Anemia: Likely secondary to ESRD-hemoglobin stable-continue to follow.  No indication for transfusion.  Hypothyroidism: Continue Synthroid  Hypotension: BP soft but stable-continue midodrine.  Pain around the left trochanter/hip area: Obtain CT of the hip  DM-2: CBGs stable but borderline low-decrease Lantus to 6 units-continue SSI-follow closely.  Recent Labs    05/30/19 2156 05/31/19 0816 05/31/19 1128  GLUCAP 89 88 79   Anxiety/depression: At baseline-continue Prozac and Klonopin.  Suspected dementia: Family does acknowledge significant short-term memory issues-with significant decline over the past 6 months - 1 year.  Stable for outpatient follow-up with neurology/psychiatry.  Chronic debility/deconditioning: Has been essentially bedbound for the past 2 years-Per patient's son-who I talked to on 5/5-PT was following at home with plans to stand and pivot over the next few days.  Obesity: Estimated body mass index is 33.99 kg/m as calculated from the following:   Height as of 05/16/19: 5\' 1"  (1.549 m).   Weight as of this encounter: 81.6 kg.    Diet: Diet Order            Diet renal/carb modified with fluid restriction Diet-HS Snack? Nothing; Fluid restriction: 1200 mL Fluid; Room service appropriate? No; Fluid consistency: Thin  Diet effective now               Code Status: Full code  Family Communication: Spouse at bedside  Disposition Plan: Status is: Inpatient  Remains inpatient appropriate because:Inpatient level of care appropriate due to severity of illness  Dispo: The patient is from: Home  Anticipated d/c is to: SNF              Anticipated d/c date is: 2 days              Patient  currently is not medically stable to d/c.  Barriers to Discharge: SDH/encephalopathy/IV antibiotics for UTI/choledocholithiasis w plans for ERCP  Antimicrobial agents: Anti-infectives (From admission, onward)   Start     Dose/Rate Route Frequency Ordered Stop   05/31/19 0715  metroNIDAZOLE (FLAGYL) IVPB 500 mg     500 mg 100 mL/hr over 60 Minutes Intravenous Every 8 hours 05/31/19 0709     05/31/19 0600  cefTRIAXone (ROCEPHIN) 1 g in sodium chloride 0.9 % 100 mL IVPB     1 g 200 mL/hr over 30 Minutes Intravenous Every 24 hours 05/31/19 0245     05/30/19 1730  cefTRIAXone (ROCEPHIN) 1 g in sodium chloride 0.9 % 100 mL IVPB     1 g 200 mL/hr over 30 Minutes Intravenous  Once 05/30/19 1724 05/30/19 1915       Time spent: 35 minutes-Greater than 50% of this time was spent in counseling, explanation of diagnosis, planning of further management, and coordination of care.  MEDICATIONS: Scheduled Meds: . atorvastatin  40 mg Oral q AM  . Chlorhexidine Gluconate Cloth  6 each Topical Daily  . [START ON 06/01/2019] Chlorhexidine Gluconate Cloth  6 each Topical Q0600  . [START ON 06/01/2019] Chlorhexidine Gluconate Cloth  6 each Topical Q0600  . docusate sodium  100 mg Oral q morning - 10a  . FLUoxetine  10 mg Oral Daily  . insulin aspart  0-6 Units Subcutaneous TID WC  . insulin detemir  10 Units Subcutaneous QHS  . levothyroxine  112 mcg Oral Q0600  . [START ON 06/01/2019] midodrine  10 mg Oral Q T,Th,S,Su  . montelukast  10 mg Oral QHS  . multivitamin  1 tablet Oral QHS  . pantoprazole  40 mg Oral QAC breakfast  . senna  1 tablet Oral q morning - 10a  . sevelamer carbonate  800 mg Oral TID WC   Continuous Infusions: . cefTRIAXone (ROCEPHIN)  IV 1 g (05/31/19 0504)  . metronidazole 500 mg (05/31/19 0831)   PRN Meds:.albuterol, clonazePAM, clonazePAM, ondansetron **OR** ondansetron (ZOFRAN) IV   PHYSICAL EXAM: Vital signs: Vitals:   05/31/19 0400 05/31/19 0800 05/31/19 1200 05/31/19  1300  BP: (!) 90/57 105/63 (!) 91/59   Pulse: 87 86 87 85  Resp: 17 18 (!) 21 15  Temp: 98.7 F (37.1 C) 98.4 F (36.9 C) 97.9 F (36.6 C)   TempSrc: Axillary Axillary Oral   SpO2: 96% 98% 93% 95%  Weight:       Filed Weights   05/30/19 2154  Weight: 81.6 kg   Body mass index is 33.99 kg/m.   Gen Exam:Alert awake-not in any distress HEENT:atraumatic, normocephalic Chest: B/L clear to auscultation anteriorly CVS:S1S2 regular Abdomen:soft non tender, non distended Extremities:no edema Neurology: All four extremities. Skin: no rash  I have personally reviewed following labs and imaging studies  LABORATORY DATA: CBC: Recent Labs  Lab 05/30/19 1325 05/31/19 0520  WBC 18.3* 16.4*  NEUTROABS 14.5*  --   HGB 11.6* 10.5*  HCT 39.2 34.3*  MCV 95.4 94.0  PLT 152 130*    Basic Metabolic Panel: Recent Labs  Lab 05/30/19 1325 05/31/19 0520  NA 132* 131*  K 3.8 3.5  CL 90* 92*  CO2 23 21*  GLUCOSE 112* 91  BUN 31*  37*  CREATININE 5.30* 5.78*  CALCIUM 9.1 8.9    GFR: Estimated Creatinine Clearance: 8 mL/min (A) (by C-G formula based on SCr of 5.78 mg/dL (H)).  Liver Function Tests: Recent Labs  Lab 05/30/19 1325 05/31/19 0520  AST 31 29  ALT 17 16  ALKPHOS 142* 132*  BILITOT 0.8 0.7  PROT 5.8* 5.2*  ALBUMIN 2.5* 2.2*   Recent Labs  Lab 05/31/19 0520  LIPASE 22   No results for input(s): AMMONIA in the last 168 hours.  Coagulation Profile: No results for input(s): INR, PROTIME in the last 168 hours.  Cardiac Enzymes: No results for input(s): CKTOTAL, CKMB, CKMBINDEX, TROPONINI in the last 168 hours.  BNP (last 3 results) Recent Labs    04/10/19 1418  PROBNP 81.0    Lipid Profile: No results for input(s): CHOL, HDL, LDLCALC, TRIG, CHOLHDL, LDLDIRECT in the last 72 hours.  Thyroid Function Tests: No results for input(s): TSH, T4TOTAL, FREET4, T3FREE, THYROIDAB in the last 72 hours.  Anemia Panel: No results for input(s): VITAMINB12,  FOLATE, FERRITIN, TIBC, IRON, RETICCTPCT in the last 72 hours.  Urine analysis:    Component Value Date/Time   COLORURINE YELLOW 05/30/2019 1633   APPEARANCEUR TURBID (A) 05/30/2019 1633   LABSPEC 1.011 05/30/2019 1633   PHURINE 8.0 05/30/2019 1633   GLUCOSEU 150 (A) 05/30/2019 1633   HGBUR MODERATE (A) 05/30/2019 1633   BILIRUBINUR NEGATIVE 05/30/2019 1633   KETONESUR NEGATIVE 05/30/2019 1633   PROTEINUR 100 (A) 05/30/2019 1633   NITRITE NEGATIVE 05/30/2019 1633   LEUKOCYTESUR MODERATE (A) 05/30/2019 1633    Sepsis Labs: Lactic Acid, Venous    Component Value Date/Time   LATICACIDVEN 0.6 12/25/2018 0803    MICROBIOLOGY: Recent Results (from the past 240 hour(s))  Urine culture     Status: Abnormal (Preliminary result)   Collection Time: 05/30/19  2:59 PM   Specimen: Urine, Catheterized  Result Value Ref Range Status   Specimen Description URINE, CATHETERIZED  Final   Special Requests NONE  Final   Culture (A)  Final    >=100,000 COLONIES/mL GRAM NEGATIVE RODS SUSCEPTIBILITIES TO FOLLOW Performed at [facility], [place] [address], [place],  [address]    Report Status PENDING  Incomplete  Respiratory Panel by RT PCR (Flu A&B, Covid) - Nasopharyngeal Swab     Status: None   Collection Time: 05/30/19  8:48 PM   Specimen: Nasopharyngeal Swab  Result Value Ref Range Status   SARS Coronavirus 2 by RT PCR NEGATIVE NEGATIVE Final    Comment: (NOTE) SARS-CoV-2 target nucleic acids are NOT DETECTED. The SARS-CoV-2 RNA is generally detectable in upper respiratoy specimens during the acute phase of infection. The lowest concentration of SARS-CoV-2 viral copies this assay can detect is 131 copies/mL. A negative result does not preclude SARS-Cov-2 infection and should not be used as the sole basis for treatment or other patient management decisions. A negative result may occur with  improper specimen collection/handling, submission of specimen other than  nasopharyngeal swab, presence of viral mutation(s) within the areas targeted by this assay, and inadequate number of viral copies (<131 copies/mL). A negative result must be combined with clinical observations, patient history, and epidemiological information. The expected result is Negative. Fact Sheet for Patients:  PinkCheek.be Fact Sheet for Healthcare Providers:  GravelBags.it This test is not yet ap proved or cleared by the [place] FDA and  has been authorized for detection and/or diagnosis of SARS-CoV-2 by FDA under an Emergency Use Authorization (EUA).  This EUA will remain  in effect (meaning this test can be used) for the duration of the COVID-19 declaration under Section 564(b)(1) of the Act, 21 U.S.C. section 360bbb-3(b)(1), unless the authorization is terminated or revoked sooner.    Influenza A by PCR NEGATIVE NEGATIVE Final   Influenza B by PCR NEGATIVE NEGATIVE Final    Comment: (NOTE) The Xpert Xpress SARS-CoV-2/FLU/RSV assay is intended as an aid in  the diagnosis of influenza from Nasopharyngeal swab specimens and  should not be used as a sole basis for treatment. Nasal washings and  aspirates are unacceptable for Xpert Xpress SARS-CoV-2/FLU/RSV  testing. Fact Sheet for Patients: PinkCheek.be Fact Sheet for Healthcare Providers: GravelBags.it This test is not yet approved or cleared by the [place] FDA and  has been authorized for detection and/or diagnosis of SARS-CoV-2 by  FDA under an Emergency Use Authorization (EUA). This EUA will remain  in effect (meaning this test can be used) for the duration of the  Covid-19 declaration under Section 564(b)(1) of the Act, 21  U.S.C. section 360bbb-3(b)(1), unless the authorization is  terminated or revoked. Performed at [facility], [place] [address] [place], [place] [address]   MRSA PCR  Screening     Status: None   Collection Time: 05/31/19  9:42 AM   Specimen: Nasopharyngeal  Result Value Ref Range Status   MRSA by PCR NEGATIVE NEGATIVE Final    Comment:        The GeneXpert MRSA Assay (FDA approved for NASAL specimens only), is one component of a comprehensive MRSA colonization surveillance program. It is not intended to diagnose MRSA infection nor to guide or monitor treatment for MRSA infections. Performed at [facility], [place] [address], [place], [place] [address]     RADIOLOGY STUDIES/RESULTS: CT ABDOMEN PELVIS WO CONTRAST  Result Date: 05/30/2019 CLINICAL DATA:  Nausea and vomiting EXAM: CT ABDOMEN AND PELVIS WITHOUT CONTRAST TECHNIQUE: Multidetector CT imaging of the abdomen and pelvis was performed following the standard protocol without IV contrast. COMPARISON:  CT 12/24/2018, CT chest 09/11/2018 FINDINGS: Lower chest: Lung bases demonstrate small right pleural effusion. Chronic consolidation with calcification in the right lung base. Mild bronchiectasis. Coronary vascular calcifications. Incompletely visualized dialysis catheter tip in the SVC. Hepatobiliary: Multiple calcified gallstones. Intra and extrahepatic biliary dilatation. Numerous calcified stones in the common bile duct. Some of the intra hepatic ductal stones appear to have migrated into the common bile duct. Additional suspected vascular calcifications within the liver. Pancreas: Unremarkable. No pancreatic ductal dilatation or surrounding inflammatory changes. Spleen: Normal in size without focal abnormality. Adrenals/Urinary Tract: Adrenal glands are normal. Atrophic kidneys without hydronephrosis. Stable 1 cm hyperdense lesion lower pole left kidney. The bladder is slightly thick walled in appearance. Stomach/Bowel: Stomach nonenlarged. No dilated small bowel. Mild wall thickening of the rectum. 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 Vascular/Lymphatic: Extensive  aortic atherosclerosis without aneurysm. No suspicious adenopathy Reproductive: Status post hysterectomy. Multiple cystic densities within the right greater than left ovary without significant change. Scattered parenchymal calcification. Other: Negative for free air or free fluid. Musculoskeletal: Degenerative changes throughout the spine. No acute or suspicious osseous abnormality. IMPRESSION: 1. Multiple calcified gallstones. Choledocholithiasis. Some of the previously noted intra hepatic biliary stones have migrated into the distal common bile duct. Degree of biliary dilatation does not appear significantly changed. 2. 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 Rectal wall thickening which could be secondary to inflammatory process, though would correlate with  direct inspection to exclude mass. 3. Atrophic kidneys without hydronephrosis. Stable 1 cm indeterminate hyperdense lesion lower pole left kidney. 4. Small right pleural effusion. Chronic consolidation with calcification in the right lung base. Mild bronchiectasis. 5. Aortic atherosclerosis. 6. Multiple cystic densities within the bilateral ovaries for which nonemergent pelvic ultrasound was previously suggest Aortic Atherosclerosis (ICD10-I70.0). Electronically Signed   By: Donavan Foil M.D.   On: 05/30/2019 19:21   DG Chest 2 View  Result Date: 05/30/2019 CLINICAL DATA:  Altered mental status. EXAM: CHEST - 2 VIEW COMPARISON:  05/04/2019. FINDINGS: Dual-lumen catheter noted with tip over SVC in stable position. Heart size stable. Stable atelectatic changes and/or infiltrates both lung bases. Bilateral interstitial prominence again noted. Stable small right pleural effusion. No pneumothorax. No acute bony abnormality identified. Diffuse osteopenia degenerative change thoracic spine. IMPRESSION: 1.  Dual-lumen catheter stable position. 2. Stable atelectatic changes and/or infiltrate both lung bases. Bilateral interstitial  prominence again noted. Small stable small right pleural effusion. Chest is unchanged from prior exam. Electronically Signed   By: Marcello Moores  Register   On: 05/30/2019 14:25   CT HEAD WO CONTRAST  Result Date: 05/31/2019 CLINICAL DATA:  Lethargy. Altered mental status. EXAM: CT HEAD WITHOUT CONTRAST TECHNIQUE: Contiguous axial images were obtained from the base of the skull through the vertex without intravenous contrast. COMPARISON:  04/19/2019 FINDINGS: Brain: No evidence of acute infarction, hemorrhage, hydrocephalus, extra-axial collection or mass lesion/mass effect. There is moderate diffuse low-attenuation within the subcortical and periventricular white matter compatible with chronic microvascular disease. Vascular: No hyperdense vessel or unexpected calcification. Skull: Normal. Negative for fracture or focal lesion. Sinuses/Orbits: The paranasal sinuses and mastoid air cells are clear. Partial opacification of the mastoid air cells noted. Other: None. IMPRESSION: 1. No acute intracranial abnormalities. 2. Chronic small vessel ischemic change and brain atrophy Electronically Signed   By: Kerby Moors M.D.   On: 05/31/2019 09:47   CT Head Wo Contrast  Result Date: 05/30/2019 CLINICAL DATA:  Encephalopathy lethargy EXAM: CT HEAD WITHOUT CONTRAST TECHNIQUE: Contiguous axial images were obtained from the base of the skull through the vertex without intravenous contrast. COMPARISON:  MRI 05/04/2019, CT brain 04/19/2019 FINDINGS: Brain: No acute territorial infarction or intracranial mass is visualized. Thin bilateral anterior convexity subdural hematomas measuring 3 mm, appear isodense without definitive hyperdensity but new since MRI 05/04/2019. Moderate atrophy. Extensive hypodensity in the white matter consistent with chronic small vessel ischemic change. Vascular: No hyperdense vessels.  Carotid vascular calcification Skull: Normal. Negative for fracture or focal lesion. Sinuses/Orbits: No acute finding.  Other: None IMPRESSION: 1. New thin anterior convexity subdural hematomas measuring up to 3 mm maximum thickness without significant mass effect. These are probably subacute but are new since MRI 05/04/2019. 2. Atrophy and extensive chronic small vessel ischemic change of the white matter. Critical Value/emergent results were called by telephone at the time of interpretation on 05/30/2019 at 4:04 pm to provider [facility] , who verbally acknowledged these results. Electronically Signed   By: Donavan Foil M.D.   On: 05/30/2019 16:05     LOS: 0 days   Oren Binet, MD  [facility]    To contact the attending provider between 7A-7P or the covering provider during after hours 7P-7A, please log into the web site [email or web address] and access using universal [place] password for that web site. If you do not have the password, please call the hospital operator.  05/31/2019, 2:37 PM

## 2019-05-31 NOTE — Consult Note (Signed)
WOC Nurse Consult Note: Patient receiving care in [facility] [phone]. Assisted with turning by NT. Reason for Consult: MASD groin area and buttocks, ? Left heel DTPI Wound type: The perianal area, posterior upper thighs, and buttocks are impacted by MASD Incontinence Associated Dermatitis.  She was incontinent of liquid brown stool at the time of my assessment. The heels were without PI. The left trochanter had a large, irregularly shaped area consistent with DTPI that measured 15 cm x 12.  Above this there was an area with scattered, linear purple/maroon lines of discoloration with underlying induration, no erythema, no bruising.  When I lightly touched this area, the patient exclaimed "Ow" and it is painful to her.  She cannot tell me how the area got there or if she fell.  I outlined the area with a marker and communicated my findings via SecureChat to Dr. Nena Alexander. Pressure Injury POA: Yes Measurement: Wound bed: Drainage (amount, consistency, odor)  Periwound: All perwound areas are intact; no odor, drainage noted from any region. Dressing procedure/placement/frequency: I have ordered a low air loss mattress and provided linen instructions (Use ONLY ONE DermaTherapy pad beneath patient's buttocks. Do NOT use disposable pads.) I have added bilateral prevalon boots as the patient cannot move very well at all.  Instructions for pericare with [employer] clear every 4 hours and after each incontinent episode have been entered.  And, use of unit based antifungal powder to abdominal and groin folds.  Monitor the wound area(s) for worsening of condition such as: Signs/symptoms of infection,  Increase in size,  Development of or worsening of odor, Development of pain, or increased pain at the affected locations.  Notify the medical team if any of these develop.  Thank you for the consult.  Discussed plan of care with the patient and bedside nurse.  [place] nurse will not follow at this time.  Please re-consult the  [place] team if needed.  Val Riles, RN, MSN, CWOCN, CNS-BC, pager [phone]

## 2019-05-31 NOTE — Progress Notes (Signed)
Patient went to dialysis. No acute distress noted, no complaints. Will continue to monitor.

## 2019-05-31 NOTE — Consult Note (Addendum)
[facility] Renal Consultation Note  Requesting MD: Oren Binet, MD  Indication for Consultation:  ESRD  Chief complaint: confusion and weakness   HPI:  Joan Mosley is a 74 y.o. female with a history of end-stage renal disease, COPD with chronic oxygen requirement, history of TIA, type 2 diabetes who presented to the hospital with progressive confusion.  She states that she was short of breath as well.  Nephrology is consulted for assistance with dialysis.  Note that she is normally on 5 L of oxygen at home per her history.  She is a somewhat limited historian.  On chart review recent admissions including for symptomatic anemia receiving PRBC's and prior to that with CVA and covid.  She has largely been bedbound.  She had a CT head and was found to have a subdural hematoma which was a new finding.   She was initiated on antibiotics for a UTI and was also noted to have choledocholithiasis.  GI has been consulted for the same.  PMHx:   Past Medical History:  Diagnosis Date  . Arthritis   . COPD (chronic obstructive pulmonary disease) ([place])   . Diabetic neuropathy ([place])   . 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 Renal disorder    ESRD  . Thyroid disease   . TIA (transient ischemic attack)   . Type 2 diabetes mellitus with peripheral neuropathy [facility])     Past Surgical History:  Procedure Laterality Date  . ABDOMINAL HYSTERECTOMY    . IR FLUORO GUIDE CV LINE RIGHT  11/14/2018  . RECTOCELE REPAIR    . REPLACEMENT TOTAL KNEE BILATERAL Bilateral 2008  . THYROIDECTOMY     80%  . VAGINAL WOUND CLOSURE / REPAIR      Family Hx:  Family History  Problem Relation Age of Onset  . Heart disease Mother   . Hypertension Mother   . Heart disease Father   . Hypertension Father     Social History:  reports that she quit smoking about 38 years ago. She has a 35.00 pack-year smoking history. She has never used smokeless tobacco. She reports that she does not drink  alcohol or use drugs.  Allergies:  Allergies  Allergen Reactions  . Avelox [Moxifloxacin Hcl In Nacl] Other (See Comments)    Unknown reaction  . Codeine Anaphylaxis  . Penicillins Rash    Did it involve swelling of the face/tongue/throat, SOB, or low BP? Unk Did it involve sudden or severe rash/hives, skin peeling, or any reaction on the inside of your mouth or nose? Yes Did you need to seek medical attention at a hospital or doctor's office? Unk When did it last happen? Unk If all above answers are "NO", may proceed with cephalosporin use.   . Sulfa Antibiotics Other (See Comments)    Unknown reaction  . Dextromethorphan-Guaifenesin Other (See Comments)    Reported by [facility] - unknown reaction. Pt states she is not allergic to dextromethorphan-guaifenesin.  . Shellfish Allergy Hives, Itching and Rash    Medications: Prior to Admission medications   Medication Sig Start Date End Date Taking? Authorizing Provider  acetaminophen (TYLENOL) 325 MG tablet Take 650 mg by mouth every 6 (six) hours as needed (for arthritic pain).    Yes [provider]  ascorbic acid (VITAMIN C) 500 MG tablet Take 1 tablet (500 mg total) by mouth daily. 02/23/19  Yes Hosie Poisson, MD  atorvastatin (LIPITOR) 40 MG tablet Take 1 tablet (40 mg total) by  mouth daily. Patient taking differently: Take 40 mg by mouth in the morning.  05/16/19  Yes Medina-Vargas, Monina C, NP  b complex-vitamin c-folic acid (NEPHRO-VITE) 0.8 MG TABS tablet Take 1 tablet by mouth at bedtime.   Yes [provider]  benzonatate (TESSALON) 200 MG capsule Take 1 capsule (200 mg total) by mouth 2 (two) times daily as needed for cough. 02/08/19  Yes Nche, Charlene Brooke, NP  clonazePAM (KLONOPIN) 0.5 MG tablet Take 0.25-0.5 mg by mouth See admin instructions. Take 0.25 mg by mouth in the morning and 0.5 mg in the evening 05/20/19  Yes [provider]  FLUoxetine (PROZAC) 10 MG tablet Take 1 tablet (10 mg total) by  mouth daily. 05/16/19  Yes Medina-Vargas, Monina C, NP  furosemide (LASIX) 20 MG tablet Take 40 mg by mouth See admin instructions. Take 40 mg by mouth in the morning on non-dialysis days: Sun/Mon/Wed/Fri   Yes [provider]  insulin detemir (LEVEMIR FLEXTOUCH) 100 UNIT/ML FlexPen Inject 10 Units into the skin at bedtime.   Yes [provider]  ipratropium-albuterol (DUONEB) 0.5-2.5 (3) MG/3ML SOLN Take 3 mLs by nebulization 4 (four) times daily.   Yes [provider]  levalbuterol Penne Lash HFA) 45 MCG/ACT inhaler Inhale 2 puffs into the lungs every 4 (four) hours as needed for wheezing. 05/16/19  Yes Medina-Vargas, Monina C, NP  levothyroxine (EUTHYROX) 112 MCG tablet Take 112 mcg by mouth daily before breakfast.   Yes [provider]  Lubricants (K-Y LUBRICANT JELLY SENSITIVE EX) Place 1 application into both nostrils as needed (as directed- for lubrication).    Yes [provider]  midodrine (PROAMATINE) 10 MG tablet Take 1 tablet (10 mg total) by mouth See admin instructions. Take 10 mg by mouth on Tuesday, Thursday, Saturday 45 minutes before dialysis 05/16/19  Yes Medina-Vargas, Monina C, NP  montelukast (SINGULAIR) 10 MG tablet Take 1 tablet (10 mg total) by mouth at bedtime. 05/16/19  Yes Medina-Vargas, Monina C, NP  NON FORMULARY Inhale into the lungs at bedtime. "BiPAP IPAP 18, EPAP 5, flow rate 5": At bedtime and during the day as needed for shortness of breath when napping   Yes [provider]  Nutritional Supplements (THICK-IT MIXED FRUIT/BERRY) MISC Take 1.5 L by mouth See admin instructions. May drink up to a total of 1.5 liters a day   Yes [provider]  nystatin (MYCOSTATIN/NYSTOP) powder Apply 1 application topically 2 (two) times daily as needed (as directed- to any rashes). Patient taking differently: Apply 1 application topically 2 (two) times daily as needed (as directed, under skin folds and/or under the breasts).   05/16/19  Yes Medina-Vargas, Monina C, NP  nystatin cream (MYCOSTATIN) Apply 1 application topically 2 (two) times daily as needed (as directed, under skin folds and/or under the breasts).   Yes [provider]  OXYGEN Inhale 5-6 L/min into the lungs continuous.    Yes [provider]  oxymetazoline (AFRIN) 0.05 % nasal spray Place 1 spray into both nostrils 2 (two) times daily as needed for congestion.   Yes [provider]  pantoprazole (PROTONIX) 40 MG tablet Take 40 mg by mouth daily before breakfast.  05/20/19  Yes [provider]  rosuvastatin (CRESTOR) 40 MG tablet Take 40 mg by mouth in the morning.   Yes [provider]  sevelamer carbonate (RENVELA) 800 MG tablet Take 1 tablet (800 mg total) by mouth 3 (three) times daily with meals. 05/16/19  Yes Medina-Vargas, Monina C, NP  SIMPLY SALINE NA Place 2 sprays into both nostrils as needed (for congestion).    Yes [provider]  trolamine salicylate (ASPER-FLEX) 10 % cream Apply 1 application topically as needed for muscle pain (to painful sites).   Yes [provider]  docusate sodium (COLACE) 100 MG capsule Take 100 mg by mouth every morning.    [provider]  guaiFENesin (MUCINEX) 600 MG 12 hr tablet Take 1 tablet (600 mg total) by mouth 2 (two) times daily for 60 doses. Patient not taking: Reported on 05/30/2019 05/16/19 06/15/19  Medina-Vargas, Monina C, NP  levothyroxine (SYNTHROID) 112 MCG tablet Take 1 tablet (112 mcg total) by mouth daily before breakfast. 1 AM Patient not taking: Reported on 05/30/2019 05/16/19   Medina-Vargas, Monina C, NP  Lidocaine (ASPERCREME LIDOCAINE) 4 % PTCH Apply 1 patch topically daily as needed (Pain). Patient not taking: Reported on 05/30/2019 05/16/19   Medina-Vargas, Senaida Lange, NP  NON FORMULARY Renal Diet Mechanical soft Nectar thick liquids    [provider]  Nutritional Supplements (FEEDING SUPPLEMENT, NEPRO CARB STEADY,) LIQD  Take 237 mLs by mouth 2 (two) times daily between meals. Patient not taking: Reported on 05/30/2019 11/22/18   Hosie Poisson, MD  senna (SENOKOT) 8.6 MG TABS tablet Take 1 tablet by mouth every morning.     [provider]  heparin 5000 UNIT/ML injection Inject 1.5 mLs (7,500 Units total) into the skin every 8 (eight) hours. 02/22/19 04/20/19  Hosie Poisson, MD    I have reviewed the patient's reported prior to admission and current medications.  She doesn't know her meds  Labs:  BMP Latest Ref Rng & Units 05/31/2019 05/30/2019 05/12/2019  Glucose 70 - 99 mg/dL 91 112(H) -  BUN 8 - 23 mg/dL 37(H) 31(H) 23(A)  Creatinine 0.44 - 1.00 mg/dL 5.78(H) 5.30(H) 4.5(A)  BUN/Creat Ratio 6 - 22 (calc) - - -  Sodium 135 - 145 mmol/L 131(L) 132(L) 139  Potassium 3.5 - 5.1 mmol/L 3.5 3.8 3.8  Chloride 98 - 111 mmol/L 92(L) 90(L) 95(A)  CO2 22 - 32 mmol/L 21(L) 23 30(A)  Calcium 8.9 - 10.3 mg/dL 8.9 9.1 9.9    Urinalysis     Component Value Date/Time   COLORURINE YELLOW 05/30/2019 1633   APPEARANCEUR TURBID (A) 05/30/2019 1633   LABSPEC 1.011 05/30/2019 1633   PHURINE 8.0 05/30/2019 1633   GLUCOSEU 150 (A) 05/30/2019 1633   HGBUR MODERATE (A) 05/30/2019 1633   BILIRUBINUR NEGATIVE 05/30/2019 1633   KETONESUR NEGATIVE 05/30/2019 1633   PROTEINUR 100 (A) 05/30/2019 1633   NITRITE NEGATIVE 05/30/2019 1633   LEUKOCYTESUR MODERATE (A) 05/30/2019 1633     ROS:  Pertinent items noted in HPI and remainder of comprehensive ROS otherwise negative. however somewhat limited 2/2 patient factors  Physical Exam: Vitals:   05/31/19 1200 05/31/19 1300  BP: (!) 91/59   Pulse: 87 85  Resp: (!) 21 15  Temp: 97.9 F (36.6 C)   SpO2: 93% 95%     General: Elderly female in bed chronically ill-appearing HEENT: Normocephalic atraumatic Eyes: Extraocular movements intact sclera anicteric Neck: Supple trachea midline Heart: S1-S2 no rub appreciated Lungs: Lungs clear anteriorly on exam in 6 L of oxygen  on my exam Abdomen: Soft nontender nondistended Extremities: No pitting edema appreciated no cyanosis Skin: No rash on extremities exposed Psych: no anxiety or agitation Access: RIJ tunneled catheter   Outpatient HD orders:  4hrs 400/800 83.5kg 2K 2Ca UF profile 4 TDC Hectorol 75mcg No heparin  mircera 44mcg q2wk ordered but last received 116mcg on 3/20   Assessment/Plan:  # End-stage renal disease - Normally on HD Tuesday Thursday Saturday - Will perform HD today then transition back to her TTS schedule   # Chronic hypotension - On midodrine before dialysis  # COPD with Chronic resp failure - Has reported 5 liter oxygen requirement - unclear if updated   # Anemia of CKD - Hb at goal   - Follow for need to reinitiate ESA  # Encephalopathy - Subdural hematoma as well as infection - Supportive care per primary team   # Subdural hematoma  - Team is monitoring with an updated CT scan  # UTI  - abx per primary team   Claudia Desanctis 05/31/2019, 2:11 PM

## 2019-06-01 ENCOUNTER — Inpatient Hospital Stay (HOSPITAL_COMMUNITY): Payer: Medicare Other

## 2019-06-01 DIAGNOSIS — N390 Urinary tract infection, site not specified: Principal | ICD-10-CM

## 2019-06-01 DIAGNOSIS — G9340 Encephalopathy, unspecified: Secondary | ICD-10-CM | POA: Diagnosis not present

## 2019-06-01 LAB — RENAL FUNCTION PANEL
Albumin: 2.3 g/dL — ABNORMAL LOW (ref 3.5–5.0)
Anion gap: 11 (ref 5–15)
BUN: 23 mg/dL (ref 8–23)
CO2: 24 mmol/L (ref 22–32)
Calcium: 8.5 mg/dL — ABNORMAL LOW (ref 8.9–10.3)
Chloride: 100 mmol/L (ref 98–111)
Creatinine, Ser: 4.38 mg/dL — ABNORMAL HIGH (ref 0.44–1.00)
GFR calc Af Amer: 11 mL/min — ABNORMAL LOW (ref >60)
GFR calc non Af Amer: 9 mL/min — ABNORMAL LOW (ref >60)
Glucose, Bld: 112 mg/dL — ABNORMAL HIGH (ref 70–99)
Phosphorus: 5.5 mg/dL — ABNORMAL HIGH (ref 2.5–4.6)
Potassium: 3.2 mmol/L — ABNORMAL LOW (ref 3.5–5.1)
Sodium: 135 mmol/L (ref 135–145)

## 2019-06-01 LAB — CBC
HCT: 33.3 % — ABNORMAL LOW (ref 36.0–46.0)
Hemoglobin: 9.9 g/dL — ABNORMAL LOW (ref 12.0–15.0)
MCH: 28 pg (ref 26.0–34.0)
MCHC: 29.7 g/dL — ABNORMAL LOW (ref 30.0–36.0)
MCV: 94.3 fL (ref 80.0–100.0)
Platelets: 152 10*3/uL (ref 150–400)
RBC: 3.53 MIL/uL — ABNORMAL LOW (ref 3.87–5.11)
RDW: 17.2 % — ABNORMAL HIGH (ref 11.5–15.5)
WBC: 15.9 10*3/uL — ABNORMAL HIGH (ref 4.0–10.5)
nRBC: 0 % (ref 0.0–0.2)

## 2019-06-01 LAB — HEPATIC FUNCTION PANEL
ALT: 14 U/L (ref 0–44)
AST: 30 U/L (ref 15–41)
Albumin: 2.3 g/dL — ABNORMAL LOW (ref 3.5–5.0)
Alkaline Phosphatase: 132 U/L — ABNORMAL HIGH (ref 38–126)
Bilirubin, Direct: 0.2 mg/dL (ref 0.0–0.2)
Indirect Bilirubin: 0.1 mg/dL — ABNORMAL LOW (ref 0.3–0.9)
Total Bilirubin: 0.3 mg/dL (ref 0.3–1.2)
Total Protein: 5.3 g/dL — ABNORMAL LOW (ref 6.5–8.1)

## 2019-06-01 LAB — GLUCOSE, CAPILLARY
Glucose-Capillary: 111 mg/dL — ABNORMAL HIGH (ref 70–99)
Glucose-Capillary: 94 mg/dL (ref 70–99)
Glucose-Capillary: 73 mg/dL (ref 70–99)
Glucose-Capillary: 92 mg/dL (ref 70–99)

## 2019-06-01 LAB — URINE CULTURE: Culture: >=100000 — AB

## 2019-06-01 MED ORDER — HEPARIN SODIUM (PORCINE) 1000 UNIT/ML IJ SOLN
INTRAMUSCULAR | Status: AC
  Administered 2019-06-01: 3800 [IU]
  Filled 2019-06-01: qty 3

## 2019-06-01 MED ORDER — IPRATROPIUM-ALBUTEROL 0.5-2.5 (3) MG/3ML IN SOLN: 3.0000 mL | RESPIRATORY_TRACT | Status: DC | PRN

## 2019-06-01 NOTE — Progress Notes (Signed)
RT NOTE:  Pt refuses CPAP tonight. Pt states she does not wear at home.

## 2019-06-01 NOTE — Progress Notes (Signed)
OT Cancellation    06/01/19 1500  OT Visit Information  Last OT Received On 06/01/19  Reason Eval/Treat Not Completed Patient at procedure or test/ unavailable (HD)  Maurie Boettcher, OT/L   Acute OT Clinical Specialist [place] Pager [phone] Office [phone]

## 2019-06-01 NOTE — Progress Notes (Signed)
[facility] Progress Note   Subjective:   Patient seen and examined at bedside.  Tolerated dialysis well overnight.  Breathing improved today.  Denies CP, edema, n/v/d, abdominal pain, dizziness and fatigue.    Objective Vitals:   06/01/19 0400 06/01/19 0751 06/01/19 0800 06/01/19 0912  BP: 98/62 (!) 89/74 102/61   Pulse: 87 90 87   Resp:  (!) 21 (!) 21   Temp: 98.2 F (36.8 C) 97.8 F (36.6 C) 97.9 F (36.6 C)   TempSrc: Axillary Oral    SpO2: 96% 94% 93% 92%  Weight:       Physical Exam General:NAD, chronically ill appearing female, sitting up in bed Heart:RRR Lungs:CTAB Abdomen:soft, obese, +BS, NTND Extremities: no LE edema Dialysis Access: R IJ [facility]   Filed Weights   05/30/19 2154 05/31/19 1430 05/31/19 1632  Weight: 81.6 kg 86.2 kg 85 kg    Intake/Output Summary (Last 24 hours) at 06/01/2019 1212 Last data filed at 06/01/2019 0500 Gross per 24 hour  Intake 720 ml  Output 982 ml  Net -262 ml    Additional Objective Labs: Basic Metabolic Panel: Recent Labs  Lab 05/30/19 1325 05/31/19 0520 06/01/19 0357  NA 132* 131* 135  K 3.8 3.5 3.2*  CL 90* 92* 100  CO2 23 21* 24  GLUCOSE 112* 91 112*  BUN 31* 37* 23  CREATININE 5.30* 5.78* 4.38*  CALCIUM 9.1 8.9 8.5*  PHOS  --   --  5.5*   Liver Function Tests: Recent Labs  Lab 05/30/19 1325 05/31/19 0520 06/01/19 0357  AST 31 29 30   ALT 17 16 14   ALKPHOS 142* 132* 132*  BILITOT 0.8 0.7 0.3  PROT 5.8* 5.2* 5.3*  ALBUMIN 2.5* 2.2* 2.3*  2.3*   Recent Labs  Lab 05/31/19 0520  LIPASE 22   CBC: Recent Labs  Lab 05/30/19 1325 05/31/19 0520 06/01/19 0357  WBC 18.3* 16.4* 15.9*  NEUTROABS 14.5*  --   --   HGB 11.6* 10.5* 9.9*  HCT 39.2 34.3* 33.3*  MCV 95.4 94.0 94.3  PLT 152 130* 152   Blood Culture    Component Value Date/Time   SDES URINE, CATHETERIZED 05/30/2019 1459   SPECREQUEST NONE 05/30/2019 1459   CULT (A) 05/30/2019 1459    >=100,000 COLONIES/mL GRAM NEGATIVE  RODS SUSCEPTIBILITIES TO FOLLOW Performed at [facility], [address] [place], [place] [address]    REPTSTATUS PENDING 05/30/2019 1459   CBG: Recent Labs  Lab 05/31/19 0816 05/31/19 1128 05/31/19 1728 05/31/19 2134 06/01/19 0745  GLUCAP 88 79 91 149* 111*    Lab Results  Component Value Date   INR 1.0 05/04/2019   INR 1.0 04/15/2019   INR 1.1 09/08/2018   Studies/Results: CT ABDOMEN PELVIS WO CONTRAST  Result Date: 05/30/2019 CLINICAL DATA:  Nausea and vomiting EXAM: CT ABDOMEN AND PELVIS WITHOUT CONTRAST TECHNIQUE: Multidetector CT imaging of the abdomen and pelvis was performed following the standard protocol without IV contrast. COMPARISON:  CT 12/24/2018, CT chest 09/11/2018 FINDINGS: Lower chest: Lung bases demonstrate small right pleural effusion. Chronic consolidation with calcification in the right lung base. Mild bronchiectasis. Coronary vascular calcifications. Incompletely visualized dialysis catheter tip in the SVC. Hepatobiliary: Multiple calcified gallstones. Intra and extrahepatic biliary dilatation. Numerous calcified stones in the common bile duct. Some of the intra hepatic ductal stones appear to have migrated into the common bile duct. Additional suspected vascular calcifications within the liver. Pancreas: Unremarkable. No pancreatic ductal dilatation or surrounding inflammatory changes. Spleen:  Normal in size without focal abnormality. Adrenals/Urinary Tract: Adrenal glands are normal. Atrophic kidneys without hydronephrosis. Stable 1 cm hyperdense lesion lower pole left kidney. The bladder is slightly thick walled in appearance. Stomach/Bowel: Stomach nonenlarged. No dilated small bowel. Mild wall thickening of the rectum. 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 Vascular/Lymphatic: Extensive aortic atherosclerosis without aneurysm. No suspicious adenopathy Reproductive: Status post hysterectomy. Multiple cystic densities within  the right greater than left ovary without significant change. Scattered parenchymal calcification. Other: Negative for free air or free fluid. Musculoskeletal: Degenerative changes throughout the spine. No acute or suspicious osseous abnormality. IMPRESSION: 1. Multiple calcified gallstones. Choledocholithiasis. Some of the previously noted intra hepatic biliary stones have migrated into the distal common bile duct. Degree of biliary dilatation does not appear significantly changed. 2. 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 Rectal wall thickening which could be secondary to inflammatory process, though would correlate with direct inspection to exclude mass. 3. Atrophic kidneys without hydronephrosis. Stable 1 cm indeterminate hyperdense lesion lower pole left kidney. 4. Small right pleural effusion. Chronic consolidation with calcification in the right lung base. Mild bronchiectasis. 5. Aortic atherosclerosis. 6. Multiple cystic densities within the bilateral ovaries for which nonemergent pelvic ultrasound was previously suggest Aortic Atherosclerosis (ICD10-I70.0). Electronically Signed   By: Donavan Foil M.D.   On: 05/30/2019 19:21   DG Chest 2 View  Result Date: 05/30/2019 CLINICAL DATA:  Altered mental status. EXAM: CHEST - 2 VIEW COMPARISON:  05/04/2019. FINDINGS: Dual-lumen catheter noted with tip over SVC in stable position. Heart size stable. Stable atelectatic changes and/or infiltrates both lung bases. Bilateral interstitial prominence again noted. Stable small right pleural effusion. No pneumothorax. No acute bony abnormality identified. Diffuse osteopenia degenerative change thoracic spine. IMPRESSION: 1.  Dual-lumen catheter stable position. 2. Stable atelectatic changes and/or infiltrate both lung bases. Bilateral interstitial prominence again noted. Small stable small right pleural effusion. Chest is unchanged from prior exam. Electronically Signed   By: Marcello Moores   Register   On: 05/30/2019 14:25   CT HEAD WO CONTRAST  Result Date: 05/31/2019 CLINICAL DATA:  Lethargy. Altered mental status. EXAM: CT HEAD WITHOUT CONTRAST TECHNIQUE: Contiguous axial images were obtained from the base of the skull through the vertex without intravenous contrast. COMPARISON:  04/19/2019 FINDINGS: Brain: No evidence of acute infarction, hemorrhage, hydrocephalus, extra-axial collection or mass lesion/mass effect. There is moderate diffuse low-attenuation within the subcortical and periventricular white matter compatible with chronic microvascular disease. Vascular: No hyperdense vessel or unexpected calcification. Skull: Normal. Negative for fracture or focal lesion. Sinuses/Orbits: The paranasal sinuses and mastoid air cells are clear. Partial opacification of the mastoid air cells noted. Other: None. IMPRESSION: 1. No acute intracranial abnormalities. 2. Chronic small vessel ischemic change and brain atrophy Electronically Signed   By: Kerby Moors M.D.   On: 05/31/2019 09:47   CT Head Wo Contrast  Result Date: 05/30/2019 CLINICAL DATA:  Encephalopathy lethargy EXAM: CT HEAD WITHOUT CONTRAST TECHNIQUE: Contiguous axial images were obtained from the base of the skull through the vertex without intravenous contrast. COMPARISON:  MRI 05/04/2019, CT brain 04/19/2019 FINDINGS: Brain: No acute territorial infarction or intracranial mass is visualized. Thin bilateral anterior convexity subdural hematomas measuring 3 mm, appear isodense without definitive hyperdensity but new since MRI 05/04/2019. Moderate atrophy. Extensive hypodensity in the white matter consistent with chronic small vessel ischemic change. Vascular: No hyperdense vessels.  Carotid vascular calcification Skull: Normal. Negative for fracture or focal lesion. Sinuses/Orbits: No acute finding. Other: None  IMPRESSION: 1. New thin anterior convexity subdural hematomas measuring up to 3 mm maximum thickness without significant mass  effect. These are probably subacute but are new since MRI 05/04/2019. 2. Atrophy and extensive chronic small vessel ischemic change of the white matter. Critical Value/emergent results were called by telephone at the time of interpretation on 05/30/2019 at 4:04 pm to provider [facility] , who verbally acknowledged these results. Electronically Signed   By: Donavan Foil M.D.   On: 05/30/2019 16:05   CT HIP LEFT WO CONTRAST  Result Date: 06/01/2019 CLINICAL DATA:  Chronic lateral left hip pain. No known recent injury EXAM: CT OF THE LEFT HIP WITHOUT CONTRAST TECHNIQUE: Multidetector CT imaging of the left hip was performed according to the standard protocol. Multiplanar CT image reconstructions were also generated. COMPARISON:  CT 05/30/2019 FINDINGS: Bones/Joint/Cartilage No acute fracture. No dislocation. Moderate left hip osteoarthritis with joint space narrowing, subchondral sclerosis, and marginal osteophyte formation. No appreciable hip joint effusion. Greater and lesser trochanteric enthesopathic changes. No peritrochanteric bursal fluid collection. SI joints and pubic symphysis intact with mild arthropathy. Ligaments Suboptimally assessed by CT. Muscles and Tendons Diffuse muscle atrophy. No acute tendinous abnormality by CT. Soft tissues Mild anasarca. No focal soft tissue fluid collection or hematoma. No focal skin ulceration. No soft tissue gas. Circumferential rectal wall thickening. Extensive sigmoid diverticulosis. Mild diffuse urinary bladder wall thickening, although the bladder is incompletely distended. Atherosclerosis. IMPRESSION: 1. No acute osseous abnormality of the left hip. 2. Moderate left hip osteoarthritis. 3. Circumferential rectal wall thickening, nonspecific but can be seen with proctitis. 4. Mild diffuse urinary bladder wall thickening, although the bladder is incompletely distended. Correlate with urinalysis to exclude cystitis. 5. Extensive sigmoid diverticulosis.  Electronically Signed   By: Davina Poke D.O.   On: 06/01/2019 10:06    Medications: . cefTRIAXone (ROCEPHIN)  IV 1 g (06/01/19 0651)  . metronidazole 500 mg (06/01/19 0745)   . atorvastatin  40 mg Oral q AM  . Chlorhexidine Gluconate Cloth  6 each Topical Daily  . Chlorhexidine Gluconate Cloth  6 each Topical Q0600  . Chlorhexidine Gluconate Cloth  6 each Topical Q0600  . docusate sodium  100 mg Oral q morning - 10a  . FLUoxetine  10 mg Oral Daily  . insulin aspart  0-6 Units Subcutaneous TID WC  . insulin detemir  6 Units Subcutaneous QHS  . levothyroxine  112 mcg Oral Q0600  . midodrine  10 mg Oral Q T,Th,S,Su  . montelukast  10 mg Oral QHS  . multivitamin  1 tablet Oral QHS  . pantoprazole  40 mg Oral QAC breakfast  . senna  1 tablet Oral q morning - 10a  . sevelamer carbonate  800 mg Oral TID WC    Dialysis Orders: [facility] 4hrs 400/800 83.5kg 2K 2Ca UF profile 4  R IJ TDC  Hectorol 32mcg No heparin  mircera 63mcg q2wk ordered but last received 161mcg on 3/20  Assessment/Plan: 1. Encephalopathy - back to baseline. ?Subdural vs UTI 2. Subdural hematoma - stable, per primary 3. UTI - UC+, on ABX, per primary  2. ESRD - on HD TTS.  Extra HD yesterday due to missing Tues.  Plan for HD today per regular schedule.  No Heparin. K3.2, use 4K bath. 4. Anemia of CKD- Hgb 9.9. Aranesp ordered.  5. Secondary hyperparathyroidism - Ca and phos at goal. Continue binders and VDRA. 6. HTN/volume - Blood pressure controlled.  Does not appear volume overloaded. Titrate down volume as  tolerated.  7. Nutrition - Renal diet w/fluid restrictions. Alb 2.3, +protein supplements, vit 8. COPD on 5L o2 at home 9. Choledocholithiasis - abd pain improving. Identified on CT. GI consulted. Non urgent ERCP when improved clinically.   10. Rectal thickening - identified on CT, GI recommend flex sig prior to d/c  Jen Mow, PA-C [facility] Pager: [phone] 06/01/2019,12:12  PM  LOS: 1 day

## 2019-06-01 NOTE — Progress Notes (Signed)
Pt states she wears a cpap at home but feels she needs a new one or settings adjusted. Possibly CM could assist with that

## 2019-06-01 NOTE — Progress Notes (Addendum)
Daily Rounding Note  06/01/2019, 12:23 PM  LOS: 1 day   SUBJECTIVE:   Chief complaint: Choledocholithiasis     Nothing acute overnight No abd pain   OBJECTIVE:         Vital signs in last 24 hours:    Temp:  [97.6 F (36.4 C)-98.6 F (37 C)] 97.9 F (36.6 C) (05/06 0800) Pulse Rate:  [84-90] 87 (05/06 0800) Resp:  [15-23] 21 (05/06 0800) BP: (89-125)/(61-75) 102/61 (05/06 0800) SpO2:  [92 %-99 %] 92 % (05/06 0912) Weight:  [85 kg-86.2 kg] 85 kg (05/05 1632) Last BM Date: 05/31/19 Filed Weights   05/30/19 2154 05/31/19 1430 05/31/19 1632  Weight: 81.6 kg 86.2 kg 85 kg   General: more alert and appropriate   Heart: NSR in 80s Chest: clear in front, no labored breathing Abdomen: obese, soft, NT.  BS hypoactive.    Extremities: non-pitting LE edema Neuro/Psych:  Appropriate, alert, follows commands.    Intake/Output from previous day: 05/05 0701 - 05/06 0700 In: 820 [P.O.:320; IV Piggyback:500] Out: 982   Intake/Output this shift: No intake/output data recorded.  Lab Results: Recent Labs    05/30/19 1325 05/31/19 0520 06/01/19 0357  WBC 18.3* 16.4* 15.9*  HGB 11.6* 10.5* 9.9*  HCT 39.2 34.3* 33.3*  PLT 152 130* 152   BMET Recent Labs    05/30/19 1325 05/31/19 0520 06/01/19 0357  NA 132* 131* 135  K 3.8 3.5 3.2*  CL 90* 92* 100  CO2 23 21* 24  GLUCOSE 112* 91 112*  BUN 31* 37* 23  CREATININE 5.30* 5.78* 4.38*  CALCIUM 9.1 8.9 8.5*   LFT Recent Labs    05/30/19 1325 05/31/19 0520 06/01/19 0357  PROT 5.8* 5.2* 5.3*  ALBUMIN 2.5* 2.2* 2.3*  2.3*  AST _0 ALT _1 ALKPHOS 142* 132* 132*  BILITOT 0.8 0.7 0.3  BILIDIR  --  0.2 0.2  IBILI  --  0.5 0.1*   PT/INR No results for input(s): LABPROT, INR in the last 72 hours. Hepatitis Panel No results for input(s): HEPBSAG, HCVAB, HEPAIGM, HEPBIGM in the last 72 hours.  Studies/Results: CT ABDOMEN PELVIS WO  CONTRAST  Result Date: 05/30/2019 CLINICAL DATA:  Nausea and vomiting EXAM: CT ABDOMEN AND PELVIS WITHOUT CONTRAST TECHNIQUE: Multidetector CT imaging of the abdomen and pelvis was performed following the standard protocol without IV contrast. COMPARISON:  CT 12/24/2018, CT chest 09/11/2018 FINDINGS: Lower chest: Lung bases demonstrate small right pleural effusion. Chronic consolidation with calcification in the right lung base. Mild bronchiectasis. Coronary vascular calcifications. Incompletely visualized dialysis catheter tip in the SVC. Hepatobiliary: Multiple calcified gallstones. Intra and extrahepatic biliary dilatation. Numerous calcified stones in the common bile duct. Some of the intra hepatic ductal stones appear to have migrated into the common bile duct. Additional suspected vascular calcifications within the liver. Pancreas: Unremarkable. No pancreatic ductal dilatation or surrounding inflammatory changes. Spleen: Normal in size without focal abnormality. Adrenals/Urinary Tract: Adrenal glands are normal. Atrophic kidneys without hydronephrosis. Stable 1 cm hyperdense lesion lower pole left kidney. The bladder is slightly thick walled in appearance. Stomach/Bowel: Stomach nonenlarged. No dilated small bowel. Mild wall thickening of the rectum. 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 Vascular/Lymphatic: Extensive aortic atherosclerosis without aneurysm. No suspicious adenopathy Reproductive: Status post hysterectomy. Multiple cystic densities within the right greater than left ovary without significant change. Scattered parenchymal calcification. Other: Negative for free air or free fluid.  Musculoskeletal: Degenerative changes throughout the spine. No acute or suspicious osseous abnormality. IMPRESSION: 1. Multiple calcified gallstones. Choledocholithiasis. Some of the previously noted intra hepatic biliary stones have migrated into the distal common bile duct. Degree of  biliary dilatation does not appear significantly changed. 2. 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 Rectal wall thickening which could be secondary to inflammatory process, though would correlate with direct inspection to exclude mass. 3. Atrophic kidneys without hydronephrosis. Stable 1 cm indeterminate hyperdense lesion lower pole left kidney. 4. Small right pleural effusion. Chronic consolidation with calcification in the right lung base. Mild bronchiectasis. 5. Aortic atherosclerosis. 6. Multiple cystic densities within the bilateral ovaries for which nonemergent pelvic ultrasound was previously suggest Aortic Atherosclerosis (ICD10-I70.0). Electronically Signed   By: Donavan Foil M.D.   On: 05/30/2019 19:21   DG Chest 2 View  Result Date: 05/30/2019 CLINICAL DATA:  Altered mental status. EXAM: CHEST - 2 VIEW COMPARISON:  05/04/2019. FINDINGS: Dual-lumen catheter noted with tip over SVC in stable position. Heart size stable. Stable atelectatic changes and/or infiltrates both lung bases. Bilateral interstitial prominence again noted. Stable small right pleural effusion. No pneumothorax. No acute bony abnormality identified. Diffuse osteopenia degenerative change thoracic spine. IMPRESSION: 1.  Dual-lumen catheter stable position. 2. Stable atelectatic changes and/or infiltrate both lung bases. Bilateral interstitial prominence again noted. Small stable small right pleural effusion. Chest is unchanged from prior exam. Electronically Signed   By: Marcello Moores  Register   On: 05/30/2019 14:25   CT HEAD WO CONTRAST  Result Date: 05/31/2019 CLINICAL DATA:  Lethargy. Altered mental status. EXAM: CT HEAD WITHOUT CONTRAST TECHNIQUE: Contiguous axial images were obtained from the base of the skull through the vertex without intravenous contrast. COMPARISON:  04/19/2019 FINDINGS: Brain: No evidence of acute infarction, hemorrhage, hydrocephalus, extra-axial collection or mass lesion/mass  effect. There is moderate diffuse low-attenuation within the subcortical and periventricular white matter compatible with chronic microvascular disease. Vascular: No hyperdense vessel or unexpected calcification. Skull: Normal. Negative for fracture or focal lesion. Sinuses/Orbits: The paranasal sinuses and mastoid air cells are clear. Partial opacification of the mastoid air cells noted. Other: None. IMPRESSION: 1. No acute intracranial abnormalities. 2. Chronic small vessel ischemic change and brain atrophy Electronically Signed   By: Kerby Moors M.D.   On: 05/31/2019 09:47   CT Head Wo Contrast  Result Date: 05/30/2019 CLINICAL DATA:  Encephalopathy lethargy EXAM: CT HEAD WITHOUT CONTRAST TECHNIQUE: Contiguous axial images were obtained from the base of the skull through the vertex without intravenous contrast. COMPARISON:  MRI 05/04/2019, CT brain 04/19/2019 FINDINGS: Brain: No acute territorial infarction or intracranial mass is visualized. Thin bilateral anterior convexity subdural hematomas measuring 3 mm, appear isodense without definitive hyperdensity but new since MRI 05/04/2019. Moderate atrophy. Extensive hypodensity in the white matter consistent with chronic small vessel ischemic change. Vascular: No hyperdense vessels.  Carotid vascular calcification Skull: Normal. Negative for fracture or focal lesion. Sinuses/Orbits: No acute finding. Other: None IMPRESSION: 1. New thin anterior convexity subdural hematomas measuring up to 3 mm maximum thickness without significant mass effect. These are probably subacute but are new since MRI 05/04/2019. 2. Atrophy and extensive chronic small vessel ischemic change of the white matter. Critical Value/emergent results were called by telephone at the time of interpretation on 05/30/2019 at 4:04 pm to provider [facility] , who verbally acknowledged these results. Electronically Signed   By: Donavan Foil M.D.   On: 05/30/2019 16:05   CT HIP LEFT WO  CONTRAST  Result Date: 06/01/2019 CLINICAL DATA:  Chronic lateral left hip pain. No known recent injury EXAM: CT OF THE LEFT HIP WITHOUT CONTRAST TECHNIQUE: Multidetector CT imaging of the left hip was performed according to the standard protocol. Multiplanar CT image reconstructions were also generated. COMPARISON:  CT 05/30/2019 FINDINGS: Bones/Joint/Cartilage No acute fracture. No dislocation. Moderate left hip osteoarthritis with joint space narrowing, subchondral sclerosis, and marginal osteophyte formation. No appreciable hip joint effusion. Greater and lesser trochanteric enthesopathic changes. No peritrochanteric bursal fluid collection. SI joints and pubic symphysis intact with mild arthropathy. Ligaments Suboptimally assessed by CT. Muscles and Tendons Diffuse muscle atrophy. No acute tendinous abnormality by CT. Soft tissues Mild anasarca. No focal soft tissue fluid collection or hematoma. No focal skin ulceration. No soft tissue gas. Circumferential rectal wall thickening. Extensive sigmoid diverticulosis. Mild diffuse urinary bladder wall thickening, although the bladder is incompletely distended. Atherosclerosis. IMPRESSION: 1. No acute osseous abnormality of the left hip. 2. Moderate left hip osteoarthritis. 3. Circumferential rectal wall thickening, nonspecific but can be seen with proctitis. 4. Mild diffuse urinary bladder wall thickening, although the bladder is incompletely distended. Correlate with urinalysis to exclude cystitis. 5. Extensive sigmoid diverticulosis. Electronically Signed   By: Davina Poke D.O.   On: 06/01/2019 10:06    ASSESMENT:   *   Choledocholithiasis Alk phos elevated, O/w normal LFTs.   Day 2 empiric flagyl, rocephin.    *   Hypokalemia.     *    ESRD, on HD TTS.    *   Anemia of  Ckd.    *   Chronic encephalopathy, extensive recurrent SDHs.    *   COPD, chronic hypoxia.     PLAN   *    ERCP with Dr Rush Landmark tmrw at 245PM.    *   Needs  potassium corrected for procedure.    *   Spoke w dtr [name] @ [phone] and pt's son ryan (a paramedic in [place]) @ [phone], and discussed risks, benefits.  They understand risks and are ok w proceeding.   [name] would like Dr Jerilynn Mages to call him after ERCP to update him w results.   Azucena Freed  06/01/2019, 12:23 PM Phone [phone]

## 2019-06-01 NOTE — Progress Notes (Signed)
Patient back from dialysis. CBG 73. Dinner tray provided. No complaints. Will continue to monitor.  Hiram Comber, RN 06/01/2019 6:21 PM

## 2019-06-01 NOTE — Progress Notes (Signed)
PT Cancellation Note  Patient Details Name: Joan Mosley MRN: [id number] DOB: 17-Mar-1945   Cancelled Treatment:    Reason Eval/Treat Not Completed: Patient at procedure or test/unavailable.  Two attempts to see pt who is gone to a procedure.  Follow up as time and pt allow.   Ramond Dial 06/01/2019, 2:15 PM  Mee Hives, PT MS Acute Rehab Dept. Number: [place] [facility] [place]

## 2019-06-01 NOTE — Progress Notes (Signed)
PROGRESS NOTE        PATIENT DETAILS Name: Joan Mosley Age: 74 y.o. Sex: female Date of Birth: 1945/04/01 Admit Date: 05/30/2019 Admitting Physician Rise Patience, MD IFB:PPHK, Charlene Brooke, NP  Brief Narrative: Patient is a 74 y.o. 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  See below for further details.  Significant events: 5/4>> admit to [facility] for confusion-found to have SDH, choledocholithiasis, UTI 4/8-4/12>> admit to [facility] for symptomatic anemia requiring PRBC transfusion  Antimicrobial therapy: Rocephin: 5/4>> Flagyl: 5/5>>  Microbiology data: 5/4: Urine culture>> gram-negative rods  Procedures/significant studies: 5/4: CT abdomen/pelvis>> choledocholithiasis, diverticular disease, multiple ovarian cysts  Consults: GI, nephrology  DVT Prophylaxis : SCD's  Subjective: Lying comfortably in bed-denies any abdominal pain or vomiting.  Assessment/Plan: Acute metabolic encephalopathy: Improved-suspect not far from baseline-no family at bedside.  Likely metabolic encephalopathy in the setting of UTI, SDH.  SDH: No focal deficits-repeat CT head on 5/5 shows stability.  ED MD on admission had spoken with neurosurgeon-Dr. Ricarda Frame advised no surgical intervention.  Choledocholithiasis: Did have some intermittent abdominal pain/nausea and vomiting on admission-currently without any abdominal pain.  Lipase levels normal-alk phos marginally elevated.  GI consulted with plans for nonurgent ERCP.  Remains on empiric Rocephin/Flagyl without any obvious signs of cholangitis.    Complicated UTI: Poor historian-does acknowledge intermittent dysuria-UA grossly abnormal-has  leukocytosis which is slowly downtrending-continue IV Rocephin-awaiting final urine culture results.  ESRD on HD MWF: Nephrology consulted for inpatient HD care.  COPD with chronic hypoxic respiratory failure on 5 L of oxygen/OSA: No wheezing-supportive care-continue bronchodilators.  Continue CPAP nightly  History of recent CVA: Nonfocal exam-antiplatelets contraindicated due to SDH.  Anemia: Likely secondary to ESRD-hemoglobin stable-continue to follow.  No indication for transfusion.  Hypothyroidism: Continue Synthroid  Hypotension: BP soft but stable-continue midodrine.  Pain around the left trochanter/hip area: Obtain CT of the hip  DM-2: CBGs stable but borderline low-decrease Lantus to 6 units-continue SSI-follow closely.  Recent Labs    05/31/19 1728 05/31/19 2134 06/01/19 0745  GLUCAP 91 149* 111*   Anxiety/depression: At baseline-continue Prozac and Klonopin.  Suspected dementia: Family does acknowledge significant short-term memory issues-with significant decline over the past 6 months - 1 year.  Stable for outpatient follow-up with neurology/psychiatry.  Chronic debility/deconditioning: Has been essentially bedbound for the past 2 years-Per patient's son-who I talked to on 5/5-PT was following at home with plans to stand and pivot over the next few days.  Obesity: Estimated body mass index is 35.41 kg/m as calculated from the following:   Height as of 05/16/19: 5' 1"  (1.549 m).   Weight as of this encounter: 85 kg.    Diet: Diet Order            Diet renal/carb modified with fluid restriction Diet-HS Snack? Nothing; Fluid restriction: 1200 mL Fluid; Room service appropriate? No; Fluid consistency: Thin  Diet effective now               Code Status: Full code  Family Communication: Spoke with son-Ryan on 5/5-we will update family tomorrow as medical issues are stable.  Disposition Plan: Status is: Inpatient  Remains inpatient appropriate  because:Inpatient level of care appropriate due to severity of illness  Dispo: The patient is from: Home              Anticipated d/c is to: SNF              Anticipated d/c date is: 2 days              Patient currently is not medically stable to d/c.  Barriers to Discharge: SDH/encephalopathy/IV antibiotics for UTI/choledocholithiasis w plans for ERCP  Antimicrobial agents: Anti-infectives (From admission, onward)   Start     Dose/Rate Route Frequency Ordered Stop   05/31/19 0715  metroNIDAZOLE (FLAGYL) IVPB 500 mg     500 mg 100 mL/hr over 60 Minutes Intravenous Every 8 hours 05/31/19 0709     05/31/19 0600  cefTRIAXone (ROCEPHIN) 1 g in sodium chloride 0.9 % 100 mL IVPB     1 g 200 mL/hr over 30 Minutes Intravenous Every 24 hours 05/31/19 0245     05/30/19 1730  cefTRIAXone (ROCEPHIN) 1 g in sodium chloride 0.9 % 100 mL IVPB     1 g 200 mL/hr over 30 Minutes Intravenous  Once 05/30/19 1724 05/30/19 1915       Time spent: 25 minutes-Greater than 50% of this time was spent in counseling, explanation of diagnosis, planning of further management, and coordination of care.  MEDICATIONS: Scheduled Meds: . atorvastatin  40 mg Oral q AM  . Chlorhexidine Gluconate Cloth  6 each Topical Daily  . Chlorhexidine Gluconate Cloth  6 each Topical Q0600  . Chlorhexidine Gluconate Cloth  6 each Topical Q0600  . docusate sodium  100 mg Oral q morning - 10a  . FLUoxetine  10 mg Oral Daily  . insulin aspart  0-6 Units Subcutaneous TID WC  . insulin detemir  6 Units Subcutaneous QHS  . levothyroxine  112 mcg Oral Q0600  . midodrine  10 mg Oral Q T,Th,S,Su  . montelukast  10 mg Oral QHS  . multivitamin  1 tablet Oral QHS  . pantoprazole  40 mg Oral QAC breakfast  . senna  1 tablet Oral q morning - 10a  . sevelamer carbonate  800 mg Oral TID WC   Continuous Infusions: . cefTRIAXone (ROCEPHIN)  IV 1 g (06/01/19 0651)  . metronidazole 500 mg (06/01/19 0745)   PRN Meds:.albuterol,  clonazePAM, clonazePAM, ipratropium-albuterol, ondansetron **OR** ondansetron (ZOFRAN) IV   PHYSICAL EXAM: Vital signs: Vitals:   06/01/19 0400 06/01/19 0751 06/01/19 0800 06/01/19 0912  BP: 98/62 (!) 89/74 102/61   Pulse: 87 90 87   Resp:  (!) 21 (!) 21   Temp: 98.2 F (36.8 C) 97.8 F (36.6 C) 97.9 F (36.6 C)   TempSrc: Axillary Oral    SpO2: 96% 94% 93% 92%  Weight:       Filed Weights   05/30/19 2154 05/31/19 1430 05/31/19 1632  Weight: 81.6 kg 86.2 kg 85 kg   Body mass index is 35.41 kg/m.   Gen Exam:Alert awake-not in any distress HEENT:atraumatic, normocephalic Chest: B/L clear to auscultation anteriorly CVS:S1S2 regular Abdomen:soft non tender, non distended Extremities:no edema-but with generalized weakness Neurology: Non focal Skin: no rash  I have personally reviewed following labs and imaging studies  LABORATORY DATA: CBC: Recent Labs  Lab 05/30/19 1325 05/31/19 0520 06/01/19 0357  WBC 18.3* 16.4* 15.9*  NEUTROABS 14.5*  --   --   HGB 11.6* 10.5* 9.9*  HCT 39.2 34.3* 33.3*  MCV  95.4 94.0 94.3  PLT 152 130* 989    Basic Metabolic Panel: Recent Labs  Lab 05/30/19 1325 05/31/19 0520 06/01/19 0357  NA 132* 131* 135  K 3.8 3.5 3.2*  CL 90* 92* 100  CO2 23 21* 24  GLUCOSE 112* 91 112*  BUN 31* 37* 23  CREATININE 5.30* 5.78* 4.38*  CALCIUM 9.1 8.9 8.5*  PHOS  --   --  5.5*    GFR: Estimated Creatinine Clearance: 10.8 mL/min (A) (by C-G formula based on SCr of 4.38 mg/dL (H)).  Liver Function Tests: Recent Labs  Lab 05/30/19 1325 05/31/19 0520 06/01/19 0357  AST 31 29 30   ALT 17 16 14   ALKPHOS 142* 132* 132*  BILITOT 0.8 0.7 0.3  PROT 5.8* 5.2* 5.3*  ALBUMIN 2.5* 2.2* 2.3*  2.3*   Recent Labs  Lab 05/31/19 0520  LIPASE 22   No results for input(s): AMMONIA in the last 168 hours.  Coagulation Profile: No results for input(s): INR, PROTIME in the last 168 hours.  Cardiac Enzymes: No results for input(s): CKTOTAL, CKMB,  CKMBINDEX, TROPONINI in the last 168 hours.  BNP (last 3 results) Recent Labs    04/10/19 1418  PROBNP 81.0    Lipid Profile: No results for input(s): CHOL, HDL, LDLCALC, TRIG, CHOLHDL, LDLDIRECT in the last 72 hours.  Thyroid Function Tests: No results for input(s): TSH, T4TOTAL, FREET4, T3FREE, THYROIDAB in the last 72 hours.  Anemia Panel: No results for input(s): VITAMINB12, FOLATE, FERRITIN, TIBC, IRON, RETICCTPCT in the last 72 hours.  Urine analysis:    Component Value Date/Time   COLORURINE YELLOW 05/30/2019 1633   APPEARANCEUR TURBID (A) 05/30/2019 1633   LABSPEC 1.011 05/30/2019 1633   PHURINE 8.0 05/30/2019 1633   GLUCOSEU 150 (A) 05/30/2019 1633   HGBUR MODERATE (A) 05/30/2019 1633   BILIRUBINUR NEGATIVE 05/30/2019 1633   KETONESUR NEGATIVE 05/30/2019 1633   PROTEINUR 100 (A) 05/30/2019 1633   NITRITE NEGATIVE 05/30/2019 1633   LEUKOCYTESUR MODERATE (A) 05/30/2019 1633    Sepsis Labs: Lactic Acid, Venous    Component Value Date/Time   LATICACIDVEN 0.6 12/25/2018 0803    MICROBIOLOGY: Recent Results (from the past 240 hour(s))  Urine culture     Status: Abnormal (Preliminary result)   Collection Time: 05/30/19  2:59 PM   Specimen: Urine, Catheterized  Result Value Ref Range Status   Specimen Description URINE, CATHETERIZED  Final   Special Requests NONE  Final   Culture (A)  Final    >=100,000 COLONIES/mL GRAM NEGATIVE RODS SUSCEPTIBILITIES TO FOLLOW Performed at [facility], [place] [address] [place],  [address]    Report Status PENDING  Incomplete  Respiratory Panel by RT PCR (Flu A&B, Covid) - Nasopharyngeal Swab     Status: None   Collection Time: 05/30/19  8:48 PM   Specimen: Nasopharyngeal Swab  Result Value Ref Range Status   SARS Coronavirus 2 by RT PCR NEGATIVE NEGATIVE Final    Comment: (NOTE) SARS-CoV-2 target nucleic acids are NOT DETECTED. The SARS-CoV-2 RNA is generally detectable in upper respiratoy specimens during  the acute phase of infection. The lowest concentration of SARS-CoV-2 viral copies this assay can detect is 131 copies/mL. A negative result does not preclude SARS-Cov-2 infection and should not be used as the sole basis for treatment or other patient management decisions. A negative result may occur with  improper specimen collection/handling, submission of specimen other than nasopharyngeal swab, presence of viral mutation(s) within the areas targeted by this  assay, and inadequate number of viral copies (<131 copies/mL). A negative result must be combined with clinical observations, patient history, and epidemiological information. The expected result is Negative. Fact Sheet for Patients:  PinkCheek.be Fact Sheet for Healthcare Providers:  GravelBags.it This test is not yet ap proved or cleared by the [place] FDA and  has been authorized for detection and/or diagnosis of SARS-CoV-2 by FDA under an Emergency Use Authorization (EUA). This EUA will remain  in effect (meaning this test can be used) for the duration of the COVID-19 declaration under Section 564(b)(1) of the Act, 21 U.S.C. section 360bbb-3(b)(1), unless the authorization is terminated or revoked sooner.    Influenza A by PCR NEGATIVE NEGATIVE Final   Influenza B by PCR NEGATIVE NEGATIVE Final    Comment: (NOTE) The Xpert Xpress SARS-CoV-2/FLU/RSV assay is intended as an aid in  the diagnosis of influenza from Nasopharyngeal swab specimens and  should not be used as a sole basis for treatment. Nasal washings and  aspirates are unacceptable for Xpert Xpress SARS-CoV-2/FLU/RSV  testing. Fact Sheet for Patients: PinkCheek.be Fact Sheet for Healthcare Providers: GravelBags.it This test is not yet approved or cleared by the [place] FDA and  has been authorized for detection and/or diagnosis of  SARS-CoV-2 by  FDA under an Emergency Use Authorization (EUA). This EUA will remain  in effect (meaning this test can be used) for the duration of the  Covid-19 declaration under Section 564(b)(1) of the Act, 21  U.S.C. section 360bbb-3(b)(1), unless the authorization is  terminated or revoked. Performed at [facility], [place] [address] [place], [place] [address]   MRSA PCR Screening     Status: None   Collection Time: 05/31/19  9:42 AM   Specimen: Nasopharyngeal  Result Value Ref Range Status   MRSA by PCR NEGATIVE NEGATIVE Final    Comment:        The GeneXpert MRSA Assay (FDA approved for NASAL specimens only), is one component of a comprehensive MRSA colonization surveillance program. It is not intended to diagnose MRSA infection nor to guide or monitor treatment for MRSA infections. Performed at [facility], [place] [address], [place], [place] [address]     RADIOLOGY STUDIES/RESULTS: CT ABDOMEN PELVIS WO CONTRAST  Result Date: 05/30/2019 CLINICAL DATA:  Nausea and vomiting EXAM: CT ABDOMEN AND PELVIS WITHOUT CONTRAST TECHNIQUE: Multidetector CT imaging of the abdomen and pelvis was performed following the standard protocol without IV contrast. COMPARISON:  CT 12/24/2018, CT chest 09/11/2018 FINDINGS: Lower chest: Lung bases demonstrate small right pleural effusion. Chronic consolidation with calcification in the right lung base. Mild bronchiectasis. Coronary vascular calcifications. Incompletely visualized dialysis catheter tip in the SVC. Hepatobiliary: Multiple calcified gallstones. Intra and extrahepatic biliary dilatation. Numerous calcified stones in the common bile duct. Some of the intra hepatic ductal stones appear to have migrated into the common bile duct. Additional suspected vascular calcifications within the liver. Pancreas: Unremarkable. No pancreatic ductal dilatation or surrounding inflammatory changes. Spleen: Normal in size without focal abnormality.  Adrenals/Urinary Tract: Adrenal glands are normal. Atrophic kidneys without hydronephrosis. Stable 1 cm hyperdense lesion lower pole left kidney. The bladder is slightly thick walled in appearance. Stomach/Bowel: Stomach nonenlarged. No dilated small bowel. Mild wall thickening of the rectum. 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 Vascular/Lymphatic: Extensive aortic atherosclerosis without aneurysm. No suspicious adenopathy Reproductive: Status post hysterectomy. Multiple cystic densities within the right greater than left ovary without significant change. Scattered parenchymal calcification. Other: Negative  for free air or free fluid. Musculoskeletal: Degenerative changes throughout the spine. No acute or suspicious osseous abnormality. IMPRESSION: 1. Multiple calcified gallstones. Choledocholithiasis. Some of the previously noted intra hepatic biliary stones have migrated into the distal common bile duct. Degree of biliary dilatation does not appear significantly changed. 2. 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 Rectal wall thickening which could be secondary to inflammatory process, though would correlate with direct inspection to exclude mass. 3. Atrophic kidneys without hydronephrosis. Stable 1 cm indeterminate hyperdense lesion lower pole left kidney. 4. Small right pleural effusion. Chronic consolidation with calcification in the right lung base. Mild bronchiectasis. 5. Aortic atherosclerosis. 6. Multiple cystic densities within the bilateral ovaries for which nonemergent pelvic ultrasound was previously suggest Aortic Atherosclerosis (ICD10-I70.0). Electronically Signed   By: Donavan Foil M.D.   On: 05/30/2019 19:21   DG Chest 2 View  Result Date: 05/30/2019 CLINICAL DATA:  Altered mental status. EXAM: CHEST - 2 VIEW COMPARISON:  05/04/2019. FINDINGS: Dual-lumen catheter noted with tip over SVC in stable position. Heart size stable. Stable  atelectatic changes and/or infiltrates both lung bases. Bilateral interstitial prominence again noted. Stable small right pleural effusion. No pneumothorax. No acute bony abnormality identified. Diffuse osteopenia degenerative change thoracic spine. IMPRESSION: 1.  Dual-lumen catheter stable position. 2. Stable atelectatic changes and/or infiltrate both lung bases. Bilateral interstitial prominence again noted. Small stable small right pleural effusion. Chest is unchanged from prior exam. Electronically Signed   By: Marcello Moores  Register   On: 05/30/2019 14:25   CT HEAD WO CONTRAST  Result Date: 05/31/2019 CLINICAL DATA:  Lethargy. Altered mental status. EXAM: CT HEAD WITHOUT CONTRAST TECHNIQUE: Contiguous axial images were obtained from the base of the skull through the vertex without intravenous contrast. COMPARISON:  04/19/2019 FINDINGS: Brain: No evidence of acute infarction, hemorrhage, hydrocephalus, extra-axial collection or mass lesion/mass effect. There is moderate diffuse low-attenuation within the subcortical and periventricular white matter compatible with chronic microvascular disease. Vascular: No hyperdense vessel or unexpected calcification. Skull: Normal. Negative for fracture or focal lesion. Sinuses/Orbits: The paranasal sinuses and mastoid air cells are clear. Partial opacification of the mastoid air cells noted. Other: None. IMPRESSION: 1. No acute intracranial abnormalities. 2. Chronic small vessel ischemic change and brain atrophy Electronically Signed   By: Kerby Moors M.D.   On: 05/31/2019 09:47   CT Head Wo Contrast  Result Date: 05/30/2019 CLINICAL DATA:  Encephalopathy lethargy EXAM: CT HEAD WITHOUT CONTRAST TECHNIQUE: Contiguous axial images were obtained from the base of the skull through the vertex without intravenous contrast. COMPARISON:  MRI 05/04/2019, CT brain 04/19/2019 FINDINGS: Brain: No acute territorial infarction or intracranial mass is visualized. Thin bilateral  anterior convexity subdural hematomas measuring 3 mm, appear isodense without definitive hyperdensity but new since MRI 05/04/2019. Moderate atrophy. Extensive hypodensity in the white matter consistent with chronic small vessel ischemic change. Vascular: No hyperdense vessels.  Carotid vascular calcification Skull: Normal. Negative for fracture or focal lesion. Sinuses/Orbits: No acute finding. Other: None IMPRESSION: 1. New thin anterior convexity subdural hematomas measuring up to 3 mm maximum thickness without significant mass effect. These are probably subacute but are new since MRI 05/04/2019. 2. Atrophy and extensive chronic small vessel ischemic change of the white matter. Critical Value/emergent results were called by telephone at the time of interpretation on 05/30/2019 at 4:04 pm to provider [facility] , who verbally acknowledged these results. Electronically Signed   By: Donavan Foil M.D.   On: 05/30/2019 16:05   CT  HIP LEFT WO CONTRAST  Result Date: 06/01/2019 CLINICAL DATA:  Chronic lateral left hip pain. No known recent injury EXAM: CT OF THE LEFT HIP WITHOUT CONTRAST TECHNIQUE: Multidetector CT imaging of the left hip was performed according to the standard protocol. Multiplanar CT image reconstructions were also generated. COMPARISON:  CT 05/30/2019 FINDINGS: Bones/Joint/Cartilage No acute fracture. No dislocation. Moderate left hip osteoarthritis with joint space narrowing, subchondral sclerosis, and marginal osteophyte formation. No appreciable hip joint effusion. Greater and lesser trochanteric enthesopathic changes. No peritrochanteric bursal fluid collection. SI joints and pubic symphysis intact with mild arthropathy. Ligaments Suboptimally assessed by CT. Muscles and Tendons Diffuse muscle atrophy. No acute tendinous abnormality by CT. Soft tissues Mild anasarca. No focal soft tissue fluid collection or hematoma. No focal skin ulceration. No soft tissue gas. Circumferential rectal wall  thickening. Extensive sigmoid diverticulosis. Mild diffuse urinary bladder wall thickening, although the bladder is incompletely distended. Atherosclerosis. IMPRESSION: 1. No acute osseous abnormality of the left hip. 2. Moderate left hip osteoarthritis. 3. Circumferential rectal wall thickening, nonspecific but can be seen with proctitis. 4. Mild diffuse urinary bladder wall thickening, although the bladder is incompletely distended. Correlate with urinalysis to exclude cystitis. 5. Extensive sigmoid diverticulosis. Electronically Signed   By: Davina Poke D.O.   On: 06/01/2019 10:06     LOS: 1 day   Oren Binet, MD  [facility]    To contact the attending provider between 7A-7P or the covering provider during after hours 7P-7A, please log into the web site [email or web address] and access using universal [place] password for that web site. If you do not have the password, please call the hospital operator.  06/01/2019, 12:00 PM

## 2019-06-01 NOTE — Evaluation (Signed)
Clinical/Bedside Swallow Evaluation Patient Details  Name: Joan Mosley MRN: [id number] Date of Birth: 1945-11-25  Today's Date: 06/01/2019 Time: SLP Start Time (ACUTE ONLY): 88 SLP Stop Time (ACUTE ONLY): 1038 SLP Time Calculation (min) (ACUTE ONLY): 18 min  Past Medical History:  Past Medical History:  Diagnosis Date  . Arthritis   . COPD (chronic obstructive pulmonary disease) ([place])   . Diabetic neuropathy ([place])   . 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 Renal disorder    ESRD  . Thyroid disease   . TIA (transient ischemic attack)   . Type 2 diabetes mellitus with peripheral neuropathy [facility])    Past Surgical History:  Past Surgical History:  Procedure Laterality Date  . ABDOMINAL HYSTERECTOMY    . IR FLUORO GUIDE CV LINE RIGHT  11/14/2018  . RECTOCELE REPAIR    . REPLACEMENT TOTAL KNEE BILATERAL Bilateral 2008  . THYROIDECTOMY     80%  . VAGINAL WOUND CLOSURE / REPAIR     HPI:  Joan Mosley is a 74 y.o. female with history of COPD, ESRD on hemodialysis, diabetes mellitus, chronic respiratory failure, hypothyroidism, choledocholithiasis recently admitted last month for symptomatic anemia. Stroke 3/21and Covid infection was eventually discharged to rehab. CT 5/4 revealed Thin bilateral anterior convexity subdural hematomas measuring up to 3 mm maximum thickness without significant mass effect. These are probably subacute but are new since MRI 05/04/2019. CT Stable atelectatic changes and/or infiltrate both lung bases. History of dysphagia with MBS 11/2018 and discharged on Dys 3 nectar (could not get MBS in Nov d/t Covid). MBS 04/15/19 premature spill, deep penetration to cords, trace esophageal backflow and Dys 3/thin recommended. Admitted with increasing weakness and confusion, vomiting. Also found to have UTI.    Assessment / Plan / Recommendation Clinical Impression  Pt known to ST team from prior admissions most recently 03/2019 where her MBS recommended Dys  3/thin liquids. Pt has had a suspected subacute stroke however appears similar to March 2021. Her she appears to have weaker more, asymmetrical labial/facial musculature on her left side. Pt is essentialy endentulous with one upper tooth and masticated sausge pattty, pancake and eggs without difficulty. Multiple straw sips thin did not reveal s/s aspiration. Educated pt to take smaller sips based on dysphagia history. Recommend continue regular texture, thin liquids and continued ST. Will downgrade texture if exhibits difficulty masticating.     SLP Visit Diagnosis: Dysphagia, unspecified (R13.10)    Aspiration Risk  Mild aspiration risk    Diet Recommendation Regular;Thin liquid   Liquid Administration via: Cup;Straw Medication Administration: Whole meds with puree Supervision: Patient able to self feed;Intermittent supervision to cue for compensatory strategies Compensations: Slow rate;Small sips/bites Postural Changes: Seated upright at 90 degrees    Other  Recommendations Oral Care Recommendations: Oral care BID   Follow up Recommendations None      Frequency and Duration min 1 x/week  1 week       Prognosis Prognosis for Safe Diet Advancement: Good      Swallow Study   General HPI: Joan Mosley is a 74 y.o. female with history of COPD, ESRD on hemodialysis, diabetes mellitus, chronic respiratory failure, hypothyroidism, choledocholithiasis recently admitted last month for symptomatic anemia. Stroke 3/21and Covid infection was eventually discharged to rehab. CT 5/4 revealed Thin bilateral anterior convexity subdural hematomas measuring up to 3 mm maximum thickness without significant mass effect. These are probably subacute but are new since MRI 05/04/2019. CT Stable atelectatic changes and/or infiltrate  both lung bases. History of dysphagia with MBS 11/2018 and discharged on Dys 3 nectar (could not get MBS in Nov d/t Covid). MBS 04/15/19 premature spill, deep penetration to  cords, trace esophageal backflow and Dys 3/thin recommended. Admitted with increasing weakness and confusion, vomiting. Also found to have UTI.  Type of Study: Bedside Swallow Evaluation Previous Swallow Assessment: (see HPI) Diet Prior to this Study: Regular;Thin liquids Temperature Spikes Noted: No Respiratory Status: Nasal cannula History of Recent Intubation: No Behavior/Cognition: Alert;Cooperative;Pleasant mood;Requires cueing Oral Cavity Assessment: Within Functional Limits Oral Care Completed by SLP: No Oral Cavity - Dentition: Poor condition;Missing dentition Vision: Functional for self-feeding Self-Feeding Abilities: Needs set up Patient Positioning: Upright in bed Baseline Vocal Quality: Normal Volitional Cough: Strong Volitional Swallow: Able to elicit    Oral/Motor/Sensory Function     Ice Chips Ice chips: Not tested   Thin Liquid Thin Liquid: Within functional limits Presentation: Straw    Nectar Thick Nectar Thick Liquid: Not tested   Honey Thick Honey Thick Liquid: Not tested   Puree Puree: Not tested   Solid     Solid: Within functional limits      Joan Mosley 06/01/2019,10:59 AM  [name] Colvin Caroli.Ed Risk analyst [phone] Office [phone]

## 2019-06-01 NOTE — Progress Notes (Signed)
Patient being transported dialysis.
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
## 2019-06-01 NOTE — H&P (View-Only) (Signed)
Daily Rounding Note  06/01/2019, 12:23 PM  LOS: 1 day   SUBJECTIVE:   Chief complaint: Choledocholithiasis     Nothing acute overnight No abd pain   OBJECTIVE:         Vital signs in last 24 hours:    Temp:  [97.6 F (36.4 C)-98.6 F (37 C)] 97.9 F (36.6 C) (05/06 0800) Pulse Rate:  [84-90] 87 (05/06 0800) Resp:  [15-23] 21 (05/06 0800) BP: (89-125)/(61-75) 102/61 (05/06 0800) SpO2:  [92 %-99 %] 92 % (05/06 0912) Weight:  [85 kg-86.2 kg] 85 kg (05/05 1632) Last BM Date: 05/31/19 Filed Weights   05/30/19 2154 05/31/19 1430 05/31/19 1632  Weight: 81.6 kg 86.2 kg 85 kg   General: more alert and appropriate   Heart: NSR in 80s Chest: clear in front, no labored breathing Abdomen: obese, soft, NT.  BS hypoactive.    Extremities: non-pitting LE edema Neuro/Psych:  Appropriate, alert, follows commands.    Intake/Output from previous day: 05/05 0701 - 05/06 0700 In: 820 [P.O.:320; IV Piggyback:500] Out: 982   Intake/Output this shift: No intake/output data recorded.  Lab Results: Recent Labs    05/30/19 1325 05/31/19 0520 06/01/19 0357  WBC 18.3* 16.4* 15.9*  HGB 11.6* 10.5* 9.9*  HCT 39.2 34.3* 33.3*  PLT 152 130* 152   BMET Recent Labs    05/30/19 1325 05/31/19 0520 06/01/19 0357  NA 132* 131* 135  K 3.8 3.5 3.2*  CL 90* 92* 100  CO2 23 21* 24  GLUCOSE 112* 91 112*  BUN 31* 37* 23  CREATININE 5.30* 5.78* 4.38*  CALCIUM 9.1 8.9 8.5*   LFT Recent Labs    05/30/19 1325 05/31/19 0520 06/01/19 0357  PROT 5.8* 5.2* 5.3*  ALBUMIN 2.5* 2.2* 2.3*  2.3*  AST _0 ALT _1 ALKPHOS 142* 132* 132*  BILITOT 0.8 0.7 0.3  BILIDIR  --  0.2 0.2  IBILI  --  0.5 0.1*   PT/INR No results for input(s): LABPROT, INR in the last 72 hours. Hepatitis Panel No results for input(s): HEPBSAG, HCVAB, HEPAIGM, HEPBIGM in the last 72 hours.  Studies/Results: CT ABDOMEN PELVIS WO  CONTRAST  Result Date: 05/30/2019 CLINICAL DATA:  Nausea and vomiting EXAM: CT ABDOMEN AND PELVIS WITHOUT CONTRAST TECHNIQUE: Multidetector CT imaging of the abdomen and pelvis was performed following the standard protocol without IV contrast. COMPARISON:  CT 12/24/2018, CT chest 09/11/2018 FINDINGS: Lower chest: Lung bases demonstrate small right pleural effusion. Chronic consolidation with calcification in the right lung base. Mild bronchiectasis. Coronary vascular calcifications. Incompletely visualized dialysis catheter tip in the SVC. Hepatobiliary: Multiple calcified gallstones. Intra and extrahepatic biliary dilatation. Numerous calcified stones in the common bile duct. Some of the intra hepatic ductal stones appear to have migrated into the common bile duct. Additional suspected vascular calcifications within the liver. Pancreas: Unremarkable. No pancreatic ductal dilatation or surrounding inflammatory changes. Spleen: Normal in size without focal abnormality. Adrenals/Urinary Tract: Adrenal glands are normal. Atrophic kidneys without hydronephrosis. Stable 1 cm hyperdense lesion lower pole left kidney. The bladder is slightly thick walled in appearance. Stomach/Bowel: Stomach nonenlarged. No dilated small bowel. Mild wall thickening of the rectum. 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 Vascular/Lymphatic: Extensive aortic atherosclerosis without aneurysm. No suspicious adenopathy Reproductive: Status post hysterectomy. Multiple cystic densities within the right greater than left ovary without significant change. Scattered parenchymal calcification. Other: Negative for free air or free fluid.  Musculoskeletal: Degenerative changes throughout the spine. No acute or suspicious osseous abnormality. IMPRESSION: 1. Multiple calcified gallstones. Choledocholithiasis. Some of the previously noted intra hepatic biliary stones have migrated into the distal common bile duct. Degree of  biliary dilatation does not appear significantly changed. 2. 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 Rectal wall thickening which could be secondary to inflammatory process, though would correlate with direct inspection to exclude mass. 3. Atrophic kidneys without hydronephrosis. Stable 1 cm indeterminate hyperdense lesion lower pole left kidney. 4. Small right pleural effusion. Chronic consolidation with calcification in the right lung base. Mild bronchiectasis. 5. Aortic atherosclerosis. 6. Multiple cystic densities within the bilateral ovaries for which nonemergent pelvic ultrasound was previously suggest Aortic Atherosclerosis (ICD10-I70.0). Electronically Signed   By: Donavan Foil M.D.   On: 05/30/2019 19:21   DG Chest 2 View  Result Date: 05/30/2019 CLINICAL DATA:  Altered mental status. EXAM: CHEST - 2 VIEW COMPARISON:  05/04/2019. FINDINGS: Dual-lumen catheter noted with tip over SVC in stable position. Heart size stable. Stable atelectatic changes and/or infiltrates both lung bases. Bilateral interstitial prominence again noted. Stable small right pleural effusion. No pneumothorax. No acute bony abnormality identified. Diffuse osteopenia degenerative change thoracic spine. IMPRESSION: 1.  Dual-lumen catheter stable position. 2. Stable atelectatic changes and/or infiltrate both lung bases. Bilateral interstitial prominence again noted. Small stable small right pleural effusion. Chest is unchanged from prior exam. Electronically Signed   By: Marcello Moores  Register   On: 05/30/2019 14:25   CT HEAD WO CONTRAST  Result Date: 05/31/2019 CLINICAL DATA:  Lethargy. Altered mental status. EXAM: CT HEAD WITHOUT CONTRAST TECHNIQUE: Contiguous axial images were obtained from the base of the skull through the vertex without intravenous contrast. COMPARISON:  04/19/2019 FINDINGS: Brain: No evidence of acute infarction, hemorrhage, hydrocephalus, extra-axial collection or mass lesion/mass  effect. There is moderate diffuse low-attenuation within the subcortical and periventricular white matter compatible with chronic microvascular disease. Vascular: No hyperdense vessel or unexpected calcification. Skull: Normal. Negative for fracture or focal lesion. Sinuses/Orbits: The paranasal sinuses and mastoid air cells are clear. Partial opacification of the mastoid air cells noted. Other: None. IMPRESSION: 1. No acute intracranial abnormalities. 2. Chronic small vessel ischemic change and brain atrophy Electronically Signed   By: Kerby Moors M.D.   On: 05/31/2019 09:47   CT Head Wo Contrast  Result Date: 05/30/2019 CLINICAL DATA:  Encephalopathy lethargy EXAM: CT HEAD WITHOUT CONTRAST TECHNIQUE: Contiguous axial images were obtained from the base of the skull through the vertex without intravenous contrast. COMPARISON:  MRI 05/04/2019, CT brain 04/19/2019 FINDINGS: Brain: No acute territorial infarction or intracranial mass is visualized. Thin bilateral anterior convexity subdural hematomas measuring 3 mm, appear isodense without definitive hyperdensity but new since MRI 05/04/2019. Moderate atrophy. Extensive hypodensity in the white matter consistent with chronic small vessel ischemic change. Vascular: No hyperdense vessels.  Carotid vascular calcification Skull: Normal. Negative for fracture or focal lesion. Sinuses/Orbits: No acute finding. Other: None IMPRESSION: 1. New thin anterior convexity subdural hematomas measuring up to 3 mm maximum thickness without significant mass effect. These are probably subacute but are new since MRI 05/04/2019. 2. Atrophy and extensive chronic small vessel ischemic change of the white matter. Critical Value/emergent results were called by telephone at the time of interpretation on 05/30/2019 at 4:04 pm to provider [facility] , who verbally acknowledged these results. Electronically Signed   By: Donavan Foil M.D.   On: 05/30/2019 16:05   CT HIP LEFT WO  CONTRAST  Result Date: 06/01/2019 CLINICAL DATA:  Chronic lateral left hip pain. No known recent injury EXAM: CT OF THE LEFT HIP WITHOUT CONTRAST TECHNIQUE: Multidetector CT imaging of the left hip was performed according to the standard protocol. Multiplanar CT image reconstructions were also generated. COMPARISON:  CT 05/30/2019 FINDINGS: Bones/Joint/Cartilage No acute fracture. No dislocation. Moderate left hip osteoarthritis with joint space narrowing, subchondral sclerosis, and marginal osteophyte formation. No appreciable hip joint effusion. Greater and lesser trochanteric enthesopathic changes. No peritrochanteric bursal fluid collection. SI joints and pubic symphysis intact with mild arthropathy. Ligaments Suboptimally assessed by CT. Muscles and Tendons Diffuse muscle atrophy. No acute tendinous abnormality by CT. Soft tissues Mild anasarca. No focal soft tissue fluid collection or hematoma. No focal skin ulceration. No soft tissue gas. Circumferential rectal wall thickening. Extensive sigmoid diverticulosis. Mild diffuse urinary bladder wall thickening, although the bladder is incompletely distended. Atherosclerosis. IMPRESSION: 1. No acute osseous abnormality of the left hip. 2. Moderate left hip osteoarthritis. 3. Circumferential rectal wall thickening, nonspecific but can be seen with proctitis. 4. Mild diffuse urinary bladder wall thickening, although the bladder is incompletely distended. Correlate with urinalysis to exclude cystitis. 5. Extensive sigmoid diverticulosis. Electronically Signed   By: Davina Poke D.O.   On: 06/01/2019 10:06    ASSESMENT:   *   Choledocholithiasis Alk phos elevated, O/w normal LFTs.   Day 2 empiric flagyl, rocephin.    *   Hypokalemia.     *    ESRD, on HD TTS.    *   Anemia of  Ckd.    *   Chronic encephalopathy, extensive recurrent SDHs.    *   COPD, chronic hypoxia.     PLAN   *    ERCP with Dr Rush Landmark tmrw at 245PM.    *   Needs  potassium corrected for procedure.    *   Spoke w dtr [name] @ [phone] and pt's son ryan (a paramedic in [place]) @ [phone], and discussed risks, benefits.  They understand risks and are ok w proceeding.   [name] would like Dr Jerilynn Mages to call him after ERCP to update him w results.   Azucena Freed  06/01/2019, 12:23 PM Phone [phone]

## 2019-06-02 ENCOUNTER — Inpatient Hospital Stay (HOSPITAL_COMMUNITY): Payer: Medicare Other | Admitting: Anesthesiology

## 2019-06-02 ENCOUNTER — Encounter (HOSPITAL_COMMUNITY): Payer: Self-pay | Admitting: Internal Medicine

## 2019-06-02 ENCOUNTER — Encounter (HOSPITAL_COMMUNITY)
Admission: EM | Disposition: A | Discharge status: Home Health | Payer: Self-pay | Source: Home / Self Care | Attending: Internal Medicine

## 2019-06-02 ENCOUNTER — Inpatient Hospital Stay (HOSPITAL_COMMUNITY): Payer: Medicare Other

## 2019-06-02 DIAGNOSIS — L8990 Pressure ulcer of unspecified site, unspecified stage: Secondary | ICD-10-CM | POA: Insufficient documentation

## 2019-06-02 HISTORY — PX: REMOVAL OF STONES: SHX5545

## 2019-06-02 HISTORY — PX: SPHINCTEROTOMY: SHX5279

## 2019-06-02 HISTORY — PX: ENDOSCOPIC RETROGRADE CHOLANGIOPANCREATOGRAPHY (ERCP) WITH PROPOFOL: SHX5810

## 2019-06-02 LAB — COMPREHENSIVE METABOLIC PANEL
ALT: 16 U/L (ref 0–44)
AST: 30 U/L (ref 15–41)
Albumin: 2.2 g/dL — ABNORMAL LOW (ref 3.5–5.0)
Alkaline Phosphatase: 128 U/L — ABNORMAL HIGH (ref 38–126)
Anion gap: 10 (ref 5–15)
BUN: 7 mg/dL — ABNORMAL LOW (ref 8–23)
CO2: 26 mmol/L (ref 22–32)
Calcium: 9.3 mg/dL (ref 8.9–10.3)
Chloride: 101 mmol/L (ref 98–111)
Creatinine, Ser: 2.74 mg/dL — ABNORMAL HIGH (ref 0.44–1.00)
GFR calc Af Amer: 19 mL/min — ABNORMAL LOW (ref >60)
GFR calc non Af Amer: 16 mL/min — ABNORMAL LOW (ref >60)
Glucose, Bld: 99 mg/dL (ref 70–99)
Potassium: 4 mmol/L (ref 3.5–5.1)
Sodium: 137 mmol/L (ref 135–145)
Total Bilirubin: 1 mg/dL (ref 0.3–1.2)
Total Protein: 5.2 g/dL — ABNORMAL LOW (ref 6.5–8.1)

## 2019-06-02 LAB — CBC
HCT: 35.2 % — ABNORMAL LOW (ref 36.0–46.0)
Hemoglobin: 10.7 g/dL — ABNORMAL LOW (ref 12.0–15.0)
MCH: 28.6 pg (ref 26.0–34.0)
MCHC: 30.4 g/dL (ref 30.0–36.0)
MCV: 94.1 fL (ref 80.0–100.0)
Platelets: 175 10*3/uL (ref 150–400)
RBC: 3.74 MIL/uL — ABNORMAL LOW (ref 3.87–5.11)
RDW: 17.6 % — ABNORMAL HIGH (ref 11.5–15.5)
WBC: 14.3 10*3/uL — ABNORMAL HIGH (ref 4.0–10.5)
nRBC: 0 % (ref 0.0–0.2)

## 2019-06-02 LAB — GLUCOSE, CAPILLARY
Glucose-Capillary: 79 mg/dL (ref 70–99)
Glucose-Capillary: 104 mg/dL — ABNORMAL HIGH (ref 70–99)
Glucose-Capillary: 131 mg/dL — ABNORMAL HIGH (ref 70–99)

## 2019-06-02 SURGERY — ENDOSCOPIC RETROGRADE CHOLANGIOPANCREATOGRAPHY (ERCP) WITH PROPOFOL
Anesthesia: General

## 2019-06-02 MED ORDER — ROCURONIUM BROMIDE 10 MG/ML (PF) SYRINGE
PREFILLED_SYRINGE | INTRAVENOUS | Status: DC | PRN
  Administered 2019-06-02: 30 mg via INTRAVENOUS

## 2019-06-02 MED ORDER — CIPROFLOXACIN IN D5W 400 MG/200ML IV SOLN
INTRAVENOUS | Status: AC
  Filled 2019-06-02: qty 200

## 2019-06-02 MED ORDER — INDOMETHACIN 50 MG RE SUPP
RECTAL | Status: AC
  Filled 2019-06-02: qty 1

## 2019-06-02 MED ORDER — FENTANYL CITRATE (PF) 100 MCG/2ML IJ SOLN
INTRAMUSCULAR | Status: AC
  Filled 2019-06-02: qty 2

## 2019-06-02 MED ORDER — INDOMETHACIN 50 MG RE SUPP
RECTAL | Status: AC
  Filled 2019-06-02: qty 2

## 2019-06-02 MED ORDER — PROPOFOL 10 MG/ML IV BOLUS
INTRAVENOUS | Status: DC | PRN
  Administered 2019-06-02: 110 mg via INTRAVENOUS

## 2019-06-02 MED ORDER — GLUCAGON HCL RDNA (DIAGNOSTIC) 1 MG IJ SOLR
INTRAMUSCULAR | Status: AC
  Filled 2019-06-02: qty 2

## 2019-06-02 MED ORDER — PHENYLEPHRINE HCL-NACL 10-0.9 MG/250ML-% IV SOLN
INTRAVENOUS | Status: DC | PRN
  Administered 2019-06-02: 75 ug/min via INTRAVENOUS

## 2019-06-02 MED ORDER — GLUCAGON HCL RDNA (DIAGNOSTIC) 1 MG IJ SOLR
INTRAMUSCULAR | Status: AC
  Filled 2019-06-02: qty 1

## 2019-06-02 MED ORDER — DEXAMETHASONE SODIUM PHOSPHATE 10 MG/ML IJ SOLN
INTRAMUSCULAR | Status: DC | PRN
  Administered 2019-06-02: 4 mg via INTRAVENOUS

## 2019-06-02 MED ORDER — INDOMETHACIN 50 MG RE SUPP: 100.0000 mg | Freq: Once | RECTAL | Status: DC

## 2019-06-02 MED ORDER — SUCCINYLCHOLINE CHLORIDE 20 MG/ML IJ SOLN
INTRAMUSCULAR | Status: DC | PRN
  Administered 2019-06-02: 120 mg via INTRAVENOUS

## 2019-06-02 MED ORDER — ONDANSETRON HCL 4 MG/2ML IJ SOLN
INTRAMUSCULAR | Status: DC | PRN
  Administered 2019-06-02: 4 mg via INTRAVENOUS

## 2019-06-02 MED ORDER — ALBUMIN HUMAN 5 % IV SOLN: INTRAVENOUS | Status: DC | PRN

## 2019-06-02 MED ORDER — CHLORHEXIDINE GLUCONATE CLOTH 2 % EX PADS
6.0000 | MEDICATED_PAD | Freq: Every day | CUTANEOUS | Status: DC
  Administered 2019-06-03 – 2019-06-05 (×3): 6 via TOPICAL

## 2019-06-02 MED ORDER — PHENYLEPHRINE HCL (PRESSORS) 10 MG/ML IV SOLN
INTRAVENOUS | Status: DC | PRN
  Administered 2019-06-02: 200 ug via INTRAVENOUS

## 2019-06-02 MED ORDER — SODIUM CHLORIDE 0.9 % IV SOLN: INTRAVENOUS | Status: DC

## 2019-06-02 MED ORDER — LIDOCAINE 2% (20 MG/ML) 5 ML SYRINGE
INTRAMUSCULAR | Status: DC | PRN
  Administered 2019-06-02: 100 mg via INTRAVENOUS

## 2019-06-02 MED ORDER — FENTANYL CITRATE (PF) 100 MCG/2ML IJ SOLN
INTRAMUSCULAR | Status: DC | PRN
  Administered 2019-06-02: 50 ug via INTRAVENOUS

## 2019-06-02 NOTE — Evaluation (Signed)
Occupational Therapy Evaluation Patient Details Name: Joan Mosley MRN: [id number] DOB: 1946-01-21 Today's Date: 06/02/2019    History of Present Illness 74 y.o. female with onset of acute encephalopathy and AMS was admitted, noted UTI, pleural effusion, had small SDH, has wounds on skin and cholechondrolithiasis with stones.  PMHx:  B TKA's, CVA, DM, chronic resp failure on 5L O2, PN, HD from ESRD, L hip OA, diverticulitis, Covid, COPD, atherosclerosis, osteopenia, TIA,    Clinical Impression   Pt is assisted for bathing, dressing and toileting, but can self feed and groom with set up. Pt reports she does not ambulate at home. Pt presents with impaired cognition, decreased activity tolerance, generalized weakness and poor sitting balance. She needs set up to total assist for ADL and one person mod to max assist to achieve sitting EOB. She demonstrated poor sitting balance at EOB and was initially dizzy, but BP and Sp02 monitored and stable. Recommending ST rehab prior to going home, but family reports they plan to take her home at discharge. Will follow acutely.    Follow Up Recommendations  SNF;Supervision/Assistance - 24 hour(family reports planning to take pt home)    Equipment Recommendations       Recommendations for Other Services       Precautions / Restrictions Precautions Precautions: Fall Precaution Comments: bowel incontinence Restrictions Weight Bearing Restrictions: No      Mobility Bed Mobility Overal bed mobility: Needs Assistance Bed Mobility: Rolling;Supine to Sit;Sit to Supine Rolling: Mod assist   Supine to sit: Max assist Sit to supine: Mod assist   General bed mobility comments: assist for LEs over EOB, to raise trunk and position hips at EOB, assisted LEs back into bed  Transfers Overall transfer level: Needs assistance Equipment used: None             General transfer comment: unable to stand with 2 person assist    Balance Overall  balance assessment: Needs assistance Sitting-balance support: Bilateral upper extremity supported Sitting balance-Leahy Scale: Poor   Postural control: Right lateral lean                                 ADL either performed or assessed with clinical judgement   ADL Overall ADL's : Needs assistance/impaired Eating/Feeding: Set up;Sitting;Bed level   Grooming: Wash/dry hands;Wash/dry face;Sitting;Minimal assistance Grooming Details (indicate cue type and reason): assist for sitting balance         Upper Body Dressing : Moderate assistance;Bed level   Lower Body Dressing: Total assistance;Bed level       Toileting- Clothing Manipulation and Hygiene: Total assistance;Bed level Toileting - Clothing Manipulation Details (indicate cue type and reason): pt with bowel incontinence             Vision Patient Visual Report: No change from baseline       Perception     Praxis      Pertinent Vitals/Pain Pain Assessment: Faces Faces Pain Scale: Hurts even more Pain Location: hips with touch Pain Descriptors / Indicators: Moaning;Grimacing;Guarding Pain Intervention(s): Monitored during session;Repositioned     Hand Dominance Right   Extremity/Trunk Assessment Upper Extremity Assessment Upper Extremity Assessment: Generalized weakness   Lower Extremity Assessment Lower Extremity Assessment: Defer to PT evaluation   Cervical / Trunk Assessment Cervical / Trunk Assessment: Other exceptions Cervical / Trunk Exceptions: obesity, weakness   Communication Communication Communication: HOH   Cognition Arousal/Alertness: Awake/alert Behavior During Therapy: Flat  affect Overall Cognitive Status: Impaired/Different from baseline Area of Impairment: Orientation;Memory;Following commands;Awareness;Problem solving                 Orientation Level: Disoriented to;Situation;Time   Memory: Decreased short-term memory Following Commands: Follows one step  commands with increased time   Awareness: Intellectual Problem Solving: Slow processing;Decreased initiation;Difficulty sequencing;Requires verbal cues;Requires tactile cues General Comments: pt's family arrived at end of session and report they plan to take her home   General Comments    Exercises   Shoulder Instructions      Home Living Family/patient expects to be discharged to:: Private residence Living Arrangements: Children Available Help at Discharge: Family;Available 24 hours/day Type of Home: House Home Access: Ramped entrance     Home Layout: One level     Bathroom Shower/Tub: Tub/shower unit;Walk-in shower         Home Equipment: Gilford Rile - 2 wheels;Cane - single point;Wheelchair - power;Electric scooter;Hospital bed;Shower seat   Additional Comments: per pt report      Prior Functioning/Environment Level of Independence: Needs assistance  Gait / Transfers Assistance Needed: pt reports she has been using a w/c ADL's / Homemaking Assistance Needed: pt self feeds and particpates in grooming, otherwise dependent   Comments: pt with memory deficits, poor historian        OT Problem List: Decreased strength;Decreased activity tolerance;Impaired balance (sitting and/or standing);Decreased cognition;Obesity;Pain      OT Treatment/Interventions: Therapeutic activities;Patient/family education    OT Goals(Current goals can be found in the care plan section) Acute Rehab OT Goals Patient Stated Goal: to go home OT Goal Formulation: With patient/family Time For Goal Achievement: 06/16/19 Potential to Achieve Goals: Fair  OT Frequency: Min 2X/week   Barriers to D/C:            Co-evaluation              AM-PAC OT "6 Clicks" Daily Activity     Outcome Measure Help from another person eating meals?: A Little Help from another person taking care of personal grooming?: A Little Help from another person toileting, which includes using toliet, bedpan, or  urinal?: Total Help from another person bathing (including washing, rinsing, drying)?: Total Help from another person to put on and taking off regular upper body clothing?: A Lot Help from another person to put on and taking off regular lower body clothing?: Total 6 Click Score: 11   End of Session Equipment Utilized During Treatment: Oxygen Nurse Communication: (needs new foam on sacrum)  Activity Tolerance: Patient tolerated treatment well Patient left: in bed;with call bell/phone within reach;with nursing/sitter in room;with family/visitor present  OT Visit Diagnosis: Pain;Muscle weakness (generalized) (M62.81);Other symptoms and signs involving the nervous system (R29.898)                Time: 5885-0277 OT Time Calculation (min): 45 min Charges:  OT General Charges $OT Visit: 1 Visit OT Evaluation $OT Eval Moderate Complexity: 1 Mod OT Treatments $Self Care/Home Management : 8-22 mins $Therapeutic Activity: 8-22 mins  Nestor Lewandowsky, OTR/L Acute Rehabilitation Services Pager: [phone] Office: [phone]  Joan Mosley 06/02/2019, 12:25 PM

## 2019-06-02 NOTE — Progress Notes (Signed)
PROGRESS NOTE        PATIENT DETAILS Name: Joan Mosley Age: 74 y.o. Sex: female Date of Birth: July 15, 1945 Admit Date: 05/30/2019 Admitting Physician Rise Patience, MD LHT:DSKA, Charlene Brooke, NP  Brief Narrative: Patient is a 74 y.o. 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  See below for further details.  Significant events: 5/4>> admit to [facility] for confusion-found to have SDH, choledocholithiasis, UTI 4/8-4/12>> admit to [facility] for symptomatic anemia requiring PRBC transfusion  Antimicrobial therapy: Rocephin: 5/4>> Flagyl: 5/5>>  Microbiology data: 5/4: Urine culture>> Enterobacter  Procedures/significant studies: 5/4: CT abdomen/pelvis>> choledocholithiasis, diverticular disease, multiple ovarian cysts  Consults: GI, nephrology  DVT Prophylaxis : SCD's  Subjective: Awake/alert-no abdominal pain.  N.p.o. for ERCP today.  Assessment/Plan: Acute metabolic encephalopathy: Improved-suspect not far from baseline-no family at bedside.  Likely metabolic encephalopathy in the setting of UTI, SDH.  SDH: No focal deficits-repeat CT head on 5/5 shows stability.  ED MD on admission had spoken with neurosurgeon-Dr. Ricarda Frame advised no surgical intervention.  Choledocholithiasis: Did have some intermittent abdominal pain/nausea and vomiting on admission-currently without any abdominal pain.  Lipase levels normal-alk phos marginally elevated.  GI consulted with plans for  ERCP later today.  Remains on empiric Rocephin/Flagyl without any obvious signs of cholangitis.    Complicated UTI: Poor historian-does acknowledge intermittent dysuria-UA grossly abnormal-has leukocytosis which  is slowly downtrending-continue IV Rocephin-awaiting final urine culture results.  ESRD on HD MWF: Nephrology consulted for inpatient HD care.  COPD with chronic hypoxic respiratory failure on 5 L of oxygen/OSA: No wheezing-supportive care-continue bronchodilators.  Continue CPAP nightly  History of recent CVA: Nonfocal exam-antiplatelets contraindicated due to SDH.  Anemia: Likely secondary to ESRD-hemoglobin stable-continue to follow.  No indication for transfusion.  Hypothyroidism: Continue Synthroid  Hypotension: BP soft but stable-continue midodrine.  Pain around the left trochanter/hip area: Obtain CT of the hip  DM-2: CBGs stable-continue Lantus 6 units-and SSI.  Follow and adjust.    Recent Labs    06/01/19 2029 06/02/19 0754 06/02/19 1155  GLUCAP 92 79 104*   Anxiety/depression: At baseline-continue Prozac and Klonopin.  Suspected dementia: Family does acknowledge significant short-term memory issues-with significant decline over the past 6 months - 1 year.  Stable for outpatient follow-up with neurology/psychiatry.  Chronic debility/deconditioning: Has been essentially bedbound for the past 2 years-Per patient's son-who I talked to on 5/5-PT was following at home with plans to stand and pivot over the next few days.  Obesity: Estimated body mass index is 32.69 kg/m as calculated from the following:   Height as of 05/16/19: 5' 1" (1.549 m).   Weight as of this encounter: 78.5 kg.    Diet: Diet Order            Diet NPO time specified Except for: Sips with Meds  Diet effective midnight               Code Status: Full code  Family Communication: Spoke with son-Ryan on 5/7  Disposition Plan: Status is: Inpatient  Remains inpatient appropriate because:Inpatient level of care appropriate due to severity of illness  Dispo: The patient is from:  Home              Anticipated d/c is to: SNF              Anticipated d/c date is: 2 days              Patient  currently is not medically stable to d/c.  Barriers to Discharge: SDH/encephalopathy/IV antibiotics for UTI/choledocholithiasis w plans for ERCP  Antimicrobial agents: Anti-infectives (From admission, onward)   Start     Dose/Rate Route Frequency Ordered Stop   05/31/19 0715  [MAR Hold]  metroNIDAZOLE (FLAGYL) IVPB 500 mg     (MAR Hold since Fri 06/02/2019 at 1430.Hold Reason: Transfer to a Procedural area.)   500 mg 100 mL/hr over 60 Minutes Intravenous Every 8 hours 05/31/19 0709     05/31/19 0600  [MAR Hold]  cefTRIAXone (ROCEPHIN) 1 g in sodium chloride 0.9 % 100 mL IVPB     (MAR Hold since Fri 06/02/2019 at 1430.Hold Reason: Transfer to a Procedural area.)   1 g 200 mL/hr over 30 Minutes Intravenous Every 24 hours 05/31/19 0245     05/30/19 1730  cefTRIAXone (ROCEPHIN) 1 g in sodium chloride 0.9 % 100 mL IVPB     1 g 200 mL/hr over 30 Minutes Intravenous  Once 05/30/19 1724 05/30/19 1915       Time spent: 25 minutes-Greater than 50% of this time was spent in counseling, explanation of diagnosis, planning of further management, and coordination of care.  MEDICATIONS: Scheduled Meds: . [MAR Hold] atorvastatin  40 mg Oral q AM  . [MAR Hold] Chlorhexidine Gluconate Cloth  6 each Topical Daily  . [MAR Hold] Chlorhexidine Gluconate Cloth  6 each Topical Q0600  . [MAR Hold] Chlorhexidine Gluconate Cloth  6 each Topical Q0600  . [MAR Hold] docusate sodium  100 mg Oral q morning - 10a  . [MAR Hold] FLUoxetine  10 mg Oral Daily  . indomethacin  100 mg Rectal Once  . [MAR Hold] insulin aspart  0-6 Units Subcutaneous TID WC  . [MAR Hold] insulin detemir  6 Units Subcutaneous QHS  . [MAR Hold] levothyroxine  112 mcg Oral Q0600  . [MAR Hold] midodrine  10 mg Oral Q T,Th,S,Su  . [MAR Hold] montelukast  10 mg Oral QHS  . [MAR Hold] multivitamin  1 tablet Oral QHS  . [MAR Hold] pantoprazole  40 mg Oral QAC breakfast  . [MAR Hold] senna  1 tablet Oral q morning - 10a  . [MAR Hold] sevelamer  carbonate  800 mg Oral TID WC   Continuous Infusions: . sodium chloride 10 mL/hr at 06/02/19 1435  . [MAR Hold] cefTRIAXone (ROCEPHIN)  IV Stopped (06/02/19 0800)  . [MAR Hold] metronidazole 500 mg (06/02/19 0924)   PRN Meds:.[MAR Hold] albuterol, [MAR Hold] clonazePAM, [MAR Hold] clonazePAM, [MAR Hold] ipratropium-albuterol, [MAR Hold] ondansetron **OR** [MAR Hold] ondansetron (ZOFRAN) IV   PHYSICAL EXAM: Vital signs: Vitals:   06/02/19 0800 06/02/19 0915 06/02/19 1200 06/02/19 1431  BP: (!) 122/57 98/64  (!) 110/51  Pulse: 96 97  88  Resp: (!) 25 (!) 24  (!) 24  Temp: 97.7 F (36.5 C)  98.1 F (36.7 C) (!) 97.5 F (36.4 C)  TempSrc: Oral  Oral Oral  SpO2: (!) 87% 92%  99%  Weight:       Filed Weights   06/01/19 1335 06/01/19 1727 06/02/19 0400  Weight: 82.1 kg 80.7 kg 78.5 kg   Body mass index is 32.69 kg/m.  Gen Exam:Alert awake-not in any distress HEENT:atraumatic, normocephalic Chest: B/L clear to auscultation anteriorly CVS:S1S2 regular Abdomen:soft non tender, non distended Extremities:no edema Neurology: Non focal Skin: no rash  I have personally reviewed following labs and imaging studies  LABORATORY DATA: CBC: Recent Labs  Lab 05/30/19 1325 05/31/19 0520 06/01/19 0357 06/02/19 0721  WBC 18.3* 16.4* 15.9* 14.3*  NEUTROABS 14.5*  --   --   --   HGB 11.6* 10.5* 9.9* 10.7*  HCT 39.2 34.3* 33.3* 35.2*  MCV 95.4 94.0 94.3 94.1  PLT 152 130* 152 542    Basic Metabolic Panel: Recent Labs  Lab 05/30/19 1325 05/31/19 0520 06/01/19 0357 06/02/19 0721  NA 132* 131* 135 137  K 3.8 3.5 3.2* 4.0  CL 90* 92* 100 101  CO2 23 21* 24 26  GLUCOSE 112* 91 112* 99  BUN 31* 37* 23 7*  CREATININE 5.30* 5.78* 4.38* 2.74*  CALCIUM 9.1 8.9 8.5* 9.3  PHOS  --   --  5.5*  --     GFR: Estimated Creatinine Clearance: 16.6 mL/min (A) (by C-G formula based on SCr of 2.74 mg/dL (H)).  Liver Function Tests: Recent Labs  Lab 05/30/19 1325 05/31/19 0520  06/01/19 0357 06/02/19 0721  AST _0 ALT _1 ALKPHOS 142* 132* 132* 128*  BILITOT 0.8 0.7 0.3 1.0  PROT 5.8* 5.2* 5.3* 5.2*  ALBUMIN 2.5* 2.2* 2.3*  2.3* 2.2*   Recent Labs  Lab 05/31/19 0520  LIPASE 22   No results for input(s): AMMONIA in the last 168 hours.  Coagulation Profile: No results for input(s): INR, PROTIME in the last 168 hours.  Cardiac Enzymes: No results for input(s): CKTOTAL, CKMB, CKMBINDEX, TROPONINI in the last 168 hours.  BNP (last 3 results) Recent Labs    04/10/19 1418  PROBNP 81.0    Lipid Profile: No results for input(s): CHOL, HDL, LDLCALC, TRIG, CHOLHDL, LDLDIRECT in the last 72 hours.  Thyroid Function Tests: No results for input(s): TSH, T4TOTAL, FREET4, T3FREE, THYROIDAB in the last 72 hours.  Anemia Panel: No results for input(s): VITAMINB12, FOLATE, FERRITIN, TIBC, IRON, RETICCTPCT in the last 72 hours.  Urine analysis:    Component Value Date/Time   COLORURINE YELLOW 05/30/2019 1633   APPEARANCEUR TURBID (A) 05/30/2019 1633   LABSPEC 1.011 05/30/2019 1633   PHURINE 8.0 05/30/2019 1633   GLUCOSEU 150 (A) 05/30/2019 1633   HGBUR MODERATE (A) 05/30/2019 1633   BILIRUBINUR NEGATIVE 05/30/2019 1633   KETONESUR NEGATIVE 05/30/2019 1633   PROTEINUR 100 (A) 05/30/2019 1633   NITRITE NEGATIVE 05/30/2019 1633   LEUKOCYTESUR MODERATE (A) 05/30/2019 1633    Sepsis Labs: Lactic Acid, Venous    Component Value Date/Time   LATICACIDVEN 0.6 12/25/2018 0803    MICROBIOLOGY: Recent Results (from the past 240 hour(s))  Urine culture     Status: Abnormal   Collection Time: 05/30/19  2:59 PM   Specimen: Urine, Catheterized  Result Value Ref Range Status   Specimen Description URINE, CATHETERIZED  Final   Special Requests   Final    NONE Performed at [facility], [place] [address] [place], [place] [address]    Culture >=100,000 COLONIES/mL ENTEROBACTER AEROGENES (A)  Final   Report Status 06/01/2019 FINAL   Final   Organism ID, Bacteria ENTEROBACTER AEROGENES (A)  Final      Susceptibility   Enterobacter aerogenes - MIC*    CEFAZOLIN >=64 RESISTANT Resistant     CEFTRIAXONE <=1  SENSITIVE Sensitive     CIPROFLOXACIN <=0.25 SENSITIVE Sensitive     GENTAMICIN <=1 SENSITIVE Sensitive     IMIPENEM 1 SENSITIVE Sensitive     NITROFURANTOIN 64 INTERMEDIATE Intermediate     TRIMETH/SULFA <=20 SENSITIVE Sensitive     PIP/TAZO <=4 SENSITIVE Sensitive     * >=100,000 COLONIES/mL ENTEROBACTER AEROGENES  Respiratory Panel by RT PCR (Flu A&B, Covid) - Nasopharyngeal Swab     Status: None   Collection Time: 05/30/19  8:48 PM   Specimen: Nasopharyngeal Swab  Result Value Ref Range Status   SARS Coronavirus 2 by RT PCR NEGATIVE NEGATIVE Final    Comment: (NOTE) SARS-CoV-2 target nucleic acids are NOT DETECTED. The SARS-CoV-2 RNA is generally detectable in upper respiratoy specimens during the acute phase of infection. The lowest concentration of SARS-CoV-2 viral copies this assay can detect is 131 copies/mL. A negative result does not preclude SARS-Cov-2 infection and should not be used as the sole basis for treatment or other patient management decisions. A negative result may occur with  improper specimen collection/handling, submission of specimen other than nasopharyngeal swab, presence of viral mutation(s) within the areas targeted by this assay, and inadequate number of viral copies (<131 copies/mL). A negative result must be combined with clinical observations, patient history, and epidemiological information. The expected result is Negative. Fact Sheet for Patients:  PinkCheek.be Fact Sheet for Healthcare Providers:  GravelBags.it This test is not yet ap proved or cleared by the [place] FDA and  has been authorized for detection and/or diagnosis of SARS-CoV-2 by FDA under an Emergency Use Authorization (EUA). This EUA will remain   in effect (meaning this test can be used) for the duration of the COVID-19 declaration under Section 564(b)(1) of the Act, 21 U.S.C. section 360bbb-3(b)(1), unless the authorization is terminated or revoked sooner.    Influenza A by PCR NEGATIVE NEGATIVE Final   Influenza B by PCR NEGATIVE NEGATIVE Final    Comment: (NOTE) The Xpert Xpress SARS-CoV-2/FLU/RSV assay is intended as an aid in  the diagnosis of influenza from Nasopharyngeal swab specimens and  should not be used as a sole basis for treatment. Nasal washings and  aspirates are unacceptable for Xpert Xpress SARS-CoV-2/FLU/RSV  testing. Fact Sheet for Patients: PinkCheek.be Fact Sheet for Healthcare Providers: GravelBags.it This test is not yet approved or cleared by the [place] FDA and  has been authorized for detection and/or diagnosis of SARS-CoV-2 by  FDA under an Emergency Use Authorization (EUA). This EUA will remain  in effect (meaning this test can be used) for the duration of the  Covid-19 declaration under Section 564(b)(1) of the Act, 21  U.S.C. section 360bbb-3(b)(1), unless the authorization is  terminated or revoked. Performed at [facility], [place] [address], [place], [place] [address]   MRSA PCR Screening     Status: None   Collection Time: 05/31/19  9:42 AM   Specimen: Nasopharyngeal  Result Value Ref Range Status   MRSA by PCR NEGATIVE NEGATIVE Final    Comment:        The GeneXpert MRSA Assay (FDA approved for NASAL specimens only), is one component of a comprehensive MRSA colonization surveillance program. It is not intended to diagnose MRSA infection nor to guide or monitor treatment for MRSA infections. Performed at [facility], [place] [address] [place], [place] [address]     RADIOLOGY STUDIES/RESULTS: CT HIP LEFT WO CONTRAST  Result Date: 06/01/2019 CLINICAL DATA:  Chronic lateral left hip pain.  No known recent  injury EXAM: CT OF THE LEFT HIP WITHOUT CONTRAST TECHNIQUE: Multidetector CT imaging of the left hip was performed according to the standard protocol. Multiplanar CT image reconstructions were also generated. COMPARISON:  CT 05/30/2019 FINDINGS: Bones/Joint/Cartilage No acute fracture. No dislocation. Moderate left hip osteoarthritis with joint space narrowing, subchondral sclerosis, and marginal osteophyte formation. No appreciable hip joint effusion. Greater and lesser trochanteric enthesopathic changes. No peritrochanteric bursal fluid collection. SI joints and pubic symphysis intact with mild arthropathy. Ligaments Suboptimally assessed by CT. Muscles and Tendons Diffuse muscle atrophy. No acute tendinous abnormality by CT. Soft tissues Mild anasarca. No focal soft tissue fluid collection or hematoma. No focal skin ulceration. No soft tissue gas. Circumferential rectal wall thickening. Extensive sigmoid diverticulosis. Mild diffuse urinary bladder wall thickening, although the bladder is incompletely distended. Atherosclerosis. IMPRESSION: 1. No acute osseous abnormality of the left hip. 2. Moderate left hip osteoarthritis. 3. Circumferential rectal wall thickening, nonspecific but can be seen with proctitis. 4. Mild diffuse urinary bladder wall thickening, although the bladder is incompletely distended. Correlate with urinalysis to exclude cystitis. 5. Extensive sigmoid diverticulosis. Electronically Signed   By: Davina Poke D.O.   On: 06/01/2019 10:06     LOS: 2 days   Oren Binet, MD  [facility]    To contact the attending provider between 7A-7P or the covering provider during after hours 7P-7A, please log into the web site [email or web address] and access using universal [place] password for that web site. If you do not have the password, please call the hospital operator.  06/02/2019, 3:48 PM

## 2019-06-02 NOTE — Progress Notes (Signed)
Patient stated to RT last night that she does not wear a CPAP at home.  Patient has been refusing.  Will continue to monitor.

## 2019-06-02 NOTE — Op Note (Signed)
[facility] Patient Name: Joan Mosley Procedure Date : 06/02/2019 MRN: [id number] Attending MD: [name] , MD Date of Birth: [birth year] CSN: [id number] Age: 74 Admit Type: Inpatient Procedure:                ERCP Indications:              Gallstones and choledocholithiasis on recent CT                            scan, transiently elevated liver tests Providers:                [name], MD, Burtis Junes, RN, Clyde Lundborg,                            RN, Lazaro Arms, Technician Referring MD:              Medicines:                General Anesthesia, Indomethacin 100 mg PR, IV                            antibiotics in hospital Complications:            No immediate complications. Estimated blood loss:                            None Estimated Blood Loss:     Estimated blood loss: none. Procedure:                Pre-Anesthesia Assessment:                           - Prior to the procedure, a History and Physical                            was performed, and patient medications and                            allergies were reviewed. The patient's tolerance of                            previous anesthesia was also reviewed. The risks                            and benefits of the procedure and the sedation                            options and risks were discussed with the patient.                            All questions were answered, and informed consent                            was obtained. Prior Anticoagulants: The patient has  taken no previous anticoagulant or antiplatelet                            agents. ASA Grade Assessment: III - A patient with                            severe systemic disease. After reviewing the risks                            and benefits, the patient was deemed in                            satisfactory condition to undergo the procedure.                           After obtaining informed consent,  the scope was                            passed under direct vision. Throughout the                            procedure, the patient's blood pressure, pulse, and                            oxygen saturations were monitored continuously. The                            [id number]-Q180V ([id number] Olympus Duodensocope was                            introduced through the mouth, and used to inject                            contrast into and used to inject contrast into the                            bile duct. The ERCP was accomplished without                            difficulty. The patient tolerated the procedure                            well. Scope In: Scope Out: Findings:      The scout film was normal. A duodenoscope was advanced to the region of       the major papilla without detailed examination of the UGI tract. The       major papilla was essentially normal. A 44 Autotome over a .035       hydrawire was used to cannulate the bile duct and contrast was injected.       Cholangiogram revealed diffusely dilated extrahepatic biliary tree that       smoothly tapered into the duodenum (CBD 53mm). The cystic duct partially       filled, low takeoff from the bile duct. The intrahepatic ducts did not  fill will dye. There were 3-4 round, mobile filling defects in the       distal CBD (5-42mm across each). Initial attempt at sphincterotomy was       unsuccessfull because the spincterotome did not bow at all and so I       removed that sphincterotomy while keeping the wire in position and then       introduced a new sphincterotome over the wire. An adequate biliary       sphincterotomy was performed over the wire. There was no       post-sphincterotomy bleeding. To discover objects, the biliary tree was       swept several times with a 12-15 mm balloon starting at the biliary       bifurcation. Several stones were delivered into the duodenum (mixed       brown stones and smooth black  stones). There was no purulence. A       completion, occlusion cholangiogram showed no remaining bile duct       stones. The main pancreatic duct was never cannulated with the wire or       injected with dye. Impression:               - Choledocholithiasis was found. Complete removal                            was accomplished by biliary sphincterotomy and                            balloon extraction. Recommendation:           - Return patient to hospital ward for ongoing care.                           - She is a very poor general surgery candidate, I                            don't think she needs cholecystectomy unless there                            is very good evidence that the remaining stones in                            her gallbladder cause her problems in the future.                           - GI inpatient team will round on her tomorrow AM,                            OK to advance her diet as tolerated for now. Procedure Code(s):        --- Professional ---                           [phone], Endoscopic retrograde                            cholangiopancreatography (ERCP); with removal of  calculi/debris from biliary/pancreatic duct(s)                           [phone], Endoscopic retrograde                            cholangiopancreatography (ERCP); with                            sphincterotomy/papillotomy Diagnosis Code(s):        --- Professional ---                           K80.50, Calculus of bile duct without cholangitis                            or cholecystitis without obstruction CPT copyright 2019 American Medical Association. All rights reserved. The codes documented in this report are preliminary and upon coder review may  be revised to meet current compliance requirements. [name], MD 06/02/2019 4:24:38 PM This report has been signed electronically. Number of Addenda: 0

## 2019-06-02 NOTE — Progress Notes (Signed)
Patient known to Renal Navigator from previous admissions. Navigator met with patient at HD bedside to offer support. Patient initially looked sleepy, but immediately perked up and spoke very clearly with Navigator. She states she remembers me and reports that she feels fine. She states she does not know why she is here. Navigator suggests that we are helping her to get better. She clearly wanted to talk and was enjoying the visit, as she said that she does not like dialysis because it is "boring." Navigator asked her about her [place] "Lulu." She spoke about her dog and her children and grandchildren. She asked about Navigator's children. She states not concerns at this time other than wanting to be able to get out of the hospital. She thanked Navigator for coming to see her.   Alphonzo Cruise, [place] Renal Navigator [phone]

## 2019-06-02 NOTE — Progress Notes (Addendum)
[facility] Progress Note   Subjective:   Patient seen in room, no new c/o, feeling "better"  Objective Vitals:   06/02/19 0000 06/02/19 0400 06/02/19 0800 06/02/19 0915  BP: 91/63 99/63 (!) 122/57 98/64  Pulse: (!) 107 (!) 103 96 97  Resp: 19 19 (!) 25 (!) 24  Temp:  98.2 F (36.8 C) 97.7 F (36.5 C)   TempSrc:  Axillary Oral   SpO2: 95% 96% (!) 87% 92%  Weight:  78.5 kg     Physical Exam General:NAD, chronically ill appearing female, sitting up in bed Heart:RRR Lungs:CTAB Abdomen:soft, obese, +BS, NTND Extremities: no LE edema Dialysis Access: R IJ [facility]   Filed Weights   06/01/19 1335 06/01/19 1727 06/02/19 0400  Weight: 82.1 kg 80.7 kg 78.5 kg    Intake/Output Summary (Last 24 hours) at 06/02/2019 1113 Last data filed at 06/01/2019 1727 Gross per 24 hour  Intake -  Output 414 ml  Net -414 ml    Additional Objective Labs: Basic Metabolic Panel: Recent Labs  Lab 05/31/19 0520 06/01/19 0357 06/02/19 0721  NA 131* 135 137  K 3.5 3.2* 4.0  CL 92* 100 101  CO2 21* 24 26  GLUCOSE 91 112* 99  BUN 37* 23 7*  CREATININE 5.78* 4.38* 2.74*  CALCIUM 8.9 8.5* 9.3  PHOS  --  5.5*  --    Liver Function Tests: Recent Labs  Lab 05/31/19 0520 06/01/19 0357 06/02/19 0721  AST 29 30 30   ALT 16 14 16   ALKPHOS 132* 132* 128*  BILITOT 0.7 0.3 1.0  PROT 5.2* 5.3* 5.2*  ALBUMIN 2.2* 2.3*  2.3* 2.2*   Recent Labs  Lab 05/31/19 0520  LIPASE 22   CBC: Recent Labs  Lab 05/30/19 1325 05/30/19 1325 05/31/19 0520 06/01/19 0357 06/02/19 0721  WBC 18.3*   < > 16.4* 15.9* 14.3*  NEUTROABS 14.5*  --   --   --   --   HGB 11.6*   < > 10.5* 9.9* 10.7*  HCT 39.2   < > 34.3* 33.3* 35.2*  MCV 95.4  --  94.0 94.3 94.1  PLT 152   < > 130* 152 175   < > = values in this interval not displayed.   Blood Culture    Component Value Date/Time   SDES URINE, CATHETERIZED 05/30/2019 1459   SPECREQUEST  05/30/2019 1459    NONE Performed at [facility] [address], [place],  [address]    CULT >=100,000 COLONIES/mL ENTEROBACTER AEROGENES (A) 05/30/2019 1459   REPTSTATUS 06/01/2019 FINAL 05/30/2019 1459   CBG: Recent Labs  Lab 06/01/19 0745 06/01/19 1221 06/01/19 1804 06/01/19 2029 06/02/19 0754  GLUCAP 111* 94 73 92 79    Lab Results  Component Value Date   INR 1.0 05/04/2019   INR 1.0 04/15/2019   INR 1.1 09/08/2018   Studies/Results: CT HIP LEFT WO CONTRAST  Result Date: 06/01/2019 CLINICAL DATA:  Chronic lateral left hip pain. No known recent injury EXAM: CT OF THE LEFT HIP WITHOUT CONTRAST TECHNIQUE: Multidetector CT imaging of the left hip was performed according to the standard protocol. Multiplanar CT image reconstructions were also generated. COMPARISON:  CT 05/30/2019 FINDINGS: Bones/Joint/Cartilage No acute fracture. No dislocation. Moderate left hip osteoarthritis with joint space narrowing, subchondral sclerosis, and marginal osteophyte formation. No appreciable hip joint effusion. Greater and lesser trochanteric enthesopathic changes. No peritrochanteric bursal fluid collection. SI joints and pubic symphysis intact with mild arthropathy. Ligaments Suboptimally assessed by  CT. Muscles and Tendons Diffuse muscle atrophy. No acute tendinous abnormality by CT. Soft tissues Mild anasarca. No focal soft tissue fluid collection or hematoma. No focal skin ulceration. No soft tissue gas. Circumferential rectal wall thickening. Extensive sigmoid diverticulosis. Mild diffuse urinary bladder wall thickening, although the bladder is incompletely distended. Atherosclerosis. IMPRESSION: 1. No acute osseous abnormality of the left hip. 2. Moderate left hip osteoarthritis. 3. Circumferential rectal wall thickening, nonspecific but can be seen with proctitis. 4. Mild diffuse urinary bladder wall thickening, although the bladder is incompletely distended. Correlate with urinalysis to exclude cystitis. 5. Extensive sigmoid diverticulosis.  Electronically Signed   By: Davina Poke D.O.   On: 06/01/2019 10:06    Medications: . cefTRIAXone (ROCEPHIN)  IV 1 g (06/02/19 0557)  . metronidazole 500 mg (06/02/19 0924)   . atorvastatin  40 mg Oral q AM  . Chlorhexidine Gluconate Cloth  6 each Topical Daily  . Chlorhexidine Gluconate Cloth  6 each Topical Q0600  . docusate sodium  100 mg Oral q morning - 10a  . FLUoxetine  10 mg Oral Daily  . insulin aspart  0-6 Units Subcutaneous TID WC  . insulin detemir  6 Units Subcutaneous QHS  . levothyroxine  112 mcg Oral Q0600  . midodrine  10 mg Oral Q T,Th,S,Su  . montelukast  10 mg Oral QHS  . multivitamin  1 tablet Oral QHS  . pantoprazole  40 mg Oral QAC breakfast  . senna  1 tablet Oral q morning - 10a  . sevelamer carbonate  800 mg Oral TID WC    Dialysis Orders: [facility]  4h  400/800  83.5kg  2/2 bath  P4  R IJ TDC  Hep none  - mircera 60 q2 last 3/20  - hect 4 ug  Assessment/Plan: 1. Encephalopathy - back to baseline. Prob due to UTI 2. Subdural hematoma - stable, per primary 3. UTI - UC+ enterobacter, getting abx , is doing much better 4. Choledocholithiasis - abd pain improving. Identified on CT. GI consulted. ERCP today for CBD stones.  5. ESRD - on HD TTS.  Next HD tomorrow, Sat.  6. Anemia of CKD- Hgb 9.9. Aranesp ordered.  7. Secondary hyperparathyroidism - Ca and phos at goal. Continue binders and VDRA. 8. HTN/volume - Blood pressure controlled.  5 kg under dry wt, will adjust at time of discharge 9. Nutrition - Renal diet w/fluid restrictions. Alb 2.3, +protein supplements, vit 10. COPD on 5L o2 at home 11. Rectal thickening - identified on CT, GI recommend flex sig prior to d/c  Kelly Splinter, MD 06/02/2019, 11:13 AM

## 2019-06-02 NOTE — Anesthesia Procedure Notes (Signed)
Procedure Name: Intubation Date/Time: 06/02/2019 3:30 PM Performed by: Inda Coke, CRNA Pre-anesthesia Checklist: Patient identified, Emergency Drugs available, Suction available and Patient being monitored Patient Re-evaluated:Patient Re-evaluated prior to induction Oxygen Delivery Method: Circle System Utilized Preoxygenation: Pre-oxygenation with 100% oxygen Induction Type: IV induction Laryngoscope Size: Mac and 3 Grade View: Grade I Tube type: Oral Tube size: 7.0 mm Number of attempts: 1 Airway Equipment and Method: Stylet and Oral airway Placement Confirmation: ETT inserted through vocal cords under direct vision,  positive ETCO2 and breath sounds checked- equal and bilateral Secured at: 21 cm Tube secured with: Tape Dental Injury: Teeth and Oropharynx as per pre-operative assessment

## 2019-06-02 NOTE — Interval H&P Note (Signed)
History and Physical Interval Note:  06/02/2019 2:51 PM  Joan Mosley  has presented today for surgery, with the diagnosis of Choledocholithiasis, dilated ducts..  The various methods of treatment have been discussed with the patient and family. After consideration of risks, benefits and other options for treatment, the patient has consented to  Procedure(s): ENDOSCOPIC RETROGRADE CHOLANGIOPANCREATOGRAPHY (ERCP) WITH PROPOFOL (N/A) as a surgical intervention.  The patient's history has been reviewed, patient examined, no change in status, stable for surgery.  I have reviewed the patient's chart and labs.  Questions were answered to the patient's satisfaction.     [name]

## 2019-06-02 NOTE — Anesthesia Preprocedure Evaluation (Addendum)
Anesthesia Evaluation  Patient identified by MRN, date of birth, ID band Patient awake    Reviewed: Allergy & Precautions, NPO status , Patient's Chart, lab work & pertinent test results  Airway Mallampati: II  TM Distance: >3 FB Neck ROM: Full    Dental  (+) Edentulous Upper, Partial Lower, Dental Advisory Given   Pulmonary sleep apnea , COPD, former smoker,    Pulmonary exam normal        Cardiovascular negative cardio ROS Normal cardiovascular exam  IMPRESSIONS    1. Left ventricular ejection fraction, by estimation, is 60 to 65%. The  left ventricle has normal function. Left ventricular endocardial border  not optimally defined to evaluate regional wall motion. Indeterminate  diastolic filling due to E-A fusion.  2. Right ventricular systolic function is normal. The right ventricular  size is normal. Tricuspid regurgitation signal is inadequate for assessing  PA pressure.  3. The mitral valve is degenerative. No evidence of mitral valve  regurgitation. No evidence of mitral stenosis.  4. The aortic valve is grossly normal. Aortic valve regurgitation is not  visualized. No aortic stenosis is present.  5. The inferior vena cava is normal in size with greater than 50%  respiratory variability, suggesting right atrial pressure of 3 mmHg.   Conclusion(s)/Recommendation(s): Very poor windows for TTE. Globally  normal LV function but unable to exclude RWMA.    Neuro/Psych PSYCHIATRIC DISORDERS Anxiety Depression Encephalopathy New subdural not requiring intervention TIACVA    GI/Hepatic negative GI ROS, Neg liver ROS,   Endo/Other  diabetesHypothyroidism   Renal/GU Renal Insufficiency and ESRFRenal disease  negative genitourinary   Musculoskeletal negative musculoskeletal ROS (+)   Abdominal   Peds negative pediatric ROS (+)  Hematology negative hematology ROS (+)   Anesthesia Other Findings   Reproductive/Obstetrics negative OB ROS                            Anesthesia Physical Anesthesia Plan  ASA: III  Anesthesia Plan: General   Post-op Pain Management:    Induction: Intravenous, Rapid sequence and Cricoid pressure planned  PONV Risk Score and Plan: 3 and Ondansetron and Dexamethasone  Airway Management Planned: Oral ETT  Additional Equipment:   Intra-op Plan:   Post-operative Plan: Extubation in OR  Informed Consent: I have reviewed the patients History and Physical, chart, labs and discussed the procedure including the risks, benefits and alternatives for the proposed anesthesia with the patient or authorized representative who has indicated his/her understanding and acceptance.     Dental advisory given and Consent reviewed with POA  Plan Discussed with: Anesthesiologist and CRNA  Anesthesia Plan Comments:        Anesthesia Quick Evaluation

## 2019-06-02 NOTE — Evaluation (Signed)
Physical Therapy Evaluation Patient Details Name: Joan Mosley MRN: [id number] DOB: 12/19/1945 Today's Date: 06/02/2019   History of Present Illness  74 y.o. female with onset of acute encephalopathy and AMS was admitted, noted UTI, pleural effusion, had small SDH, has wounds on skin and cholechondrolithiasis with stones.  PMHx:  B TKA's, CVA, DM, chronic resp failure on 5L O2, PN, HD from ESRD, L hip OA, diverticulitis, Covid, COPD, atherosclerosis, osteopenia, TIA,   Clinical Impression  Pt was seen for mobility on side of bed but had a drop in sats as soon as she was assisted there.  Became light headed and noted she got to 79%, assisted back to bed.  Follow up with her to work on LE strength, to increase sitting tolerance and work on preparation for transfers and standing as tolerated.  Pt is lethargic and weak, has low sats and may need to work a longer time to get stronger, hence the SNF request.    Follow Up Recommendations SNF    Equipment Recommendations  None recommended by PT    Recommendations for Other Services       Precautions / Restrictions Precautions Precautions: Fall Precaution Comments: monitor vitals esp O2 sats Restrictions Weight Bearing Restrictions: No      Mobility  Bed Mobility Overal bed mobility: Needs Assistance Bed Mobility: Supine to Sit;Sit to Supine     Supine to sit: Mod assist Sit to supine: Max assist   General bed mobility comments: max to assist legs and support effort to bed, desaturating  Transfers Overall transfer level: Needs assistance Equipment used: None             General transfer comment: could not stand her due to desaturation  Ambulation/Gait             General Gait Details: unable to stand her  Stairs            Wheelchair Mobility    Modified Rankin (Stroke Patients Only) Modified Rankin (Stroke Patients Only) Pre-Morbid Rankin Score: Moderately severe disability Modified Rankin: Severe  disability     Balance Overall balance assessment: Needs assistance Sitting-balance support: Feet supported;Bilateral upper extremity supported(trunk supported) Sitting balance-Leahy Scale: Poor                                       Pertinent Vitals/Pain Pain Assessment: Faces Faces Pain Scale: Hurts little more Pain Location: tight from disuse on knees Pain Descriptors / Indicators: Guarding Pain Intervention(s): Limited activity within patient's tolerance;Monitored during session;Repositioned    Home Living Family/patient expects to be discharged to:: Private residence Living Arrangements: Children Available Help at Discharge: Family;Available 24 hours/day Type of Home: House Home Access: Ramped entrance     Home Layout: One level Home Equipment: Walker - 2 wheels;Cane - single point;Wheelchair - power;Electric scooter Additional Comments: info from last admission    Prior Function Level of Independence: Needs assistance   Gait / Transfers Assistance Needed: has been weak per chart since covid  ADL's / Homemaking Assistance Needed: family assisting with bathing and dressing  Comments: pt is not able to give a full history     Hand Dominance   Dominant Hand: Right    Extremity/Trunk Assessment   Upper Extremity Assessment Upper Extremity Assessment: Generalized weakness    Lower Extremity Assessment Lower Extremity Assessment: Generalized weakness    Cervical / Trunk Assessment Cervical / Trunk Assessment:  Kyphotic  Communication   Communication: HOH  Cognition Arousal/Alertness: Lethargic Behavior During Therapy: Flat affect Overall Cognitive Status: No family/caregiver present to determine baseline cognitive functioning                                 General Comments: unsure of her PLOF with no family to offer information      General Comments General comments (skin integrity, edema, etc.): pt was supported on side of  bed, noted her sat supine was 91% on 5L but in sitting went to 79% and returned her to bed.  Sat was struggling, elevated head and increased to 6L to get her to 89% with fluctuations    Exercises     Assessment/Plan    PT Assessment Patient needs continued PT services  PT Problem List Decreased strength;Decreased range of motion;Decreased activity tolerance;Decreased balance;Decreased mobility;Decreased coordination;Decreased cognition;Decreased knowledge of use of DME;Decreased safety awareness;Cardiopulmonary status limiting activity;Obesity;Pain;Decreased skin integrity       PT Treatment Interventions DME instruction;Functional mobility training;Therapeutic activities;Therapeutic exercise;Balance training;Neuromuscular re-education;Patient/family education;Gait training    PT Goals (Current goals can be found in the Care Plan section)  Acute Rehab PT Goals Patient Stated Goal: to get dizziness to be relieved PT Goal Formulation: With patient Time For Goal Achievement: 06/16/19 Potential to Achieve Goals: Fair    Frequency Min 2X/week   Barriers to discharge Decreased caregiver support requires two person help but not clear that pt has that help    Co-evaluation               AM-PAC PT "6 Clicks" Mobility  Outcome Measure Help needed turning from your back to your side while in a flat bed without using bedrails?: A Lot Help needed moving from lying on your back to sitting on the side of a flat bed without using bedrails?: A Lot Help needed moving to and from a bed to a chair (including a wheelchair)?: A Lot Help needed standing up from a chair using your arms (e.g., wheelchair or bedside chair)?: Total Help needed to walk in hospital room?: Total Help needed climbing 3-5 steps with a railing? : Total 6 Click Score: 9    End of Session Equipment Utilized During Treatment: Oxygen Activity Tolerance: Patient limited by fatigue;Treatment limited secondary to medical  complications (Comment) Patient left: in bed;with call bell/phone within reach;with bed alarm set Nurse Communication: Mobility status;Other (comment)(reported to CNA the drop in O2) PT Visit Diagnosis: Muscle weakness (generalized) (M62.81);Difficulty in walking, not elsewhere classified (R26.2);Pain;Adult, failure to thrive (R62.7);Dizziness and giddiness (R42) Pain - part of body: Knee(both)    Time: 0258-5277 PT Time Calculation (min) (ACUTE ONLY): 30 min   Charges:   PT Evaluation $PT Eval Moderate Complexity: 1 Mod PT Treatments $Therapeutic Activity: 8-22 mins        Ramond Dial 06/02/2019, 11:21 AM  Mee Hives, PT MS Acute Rehab Dept. Number: [place] [facility] [place]

## 2019-06-02 NOTE — Transfer of Care (Signed)
Immediate Anesthesia Transfer of Care Note  Patient: [name]  Procedure(s) Performed: ENDOSCOPIC RETROGRADE CHOLANGIOPANCREATOGRAPHY (ERCP) WITH PROPOFOL (N/A )  Patient Location: PACU and Endoscopy Unit  Anesthesia Type:General  Level of Consciousness: awake, alert  and oriented  Airway & Oxygen Therapy: Patient Spontanous Breathing and Patient connected to nasal cannula oxygen  Post-op Assessment: Report given to RN and Post -op Vital signs reviewed and stable  Post vital signs: Reviewed and stable  Last Vitals:  Vitals Value Taken Time  BP 133/51 06/02/19 1626  Temp 36.4 C 06/02/19 1626  Pulse 87 06/02/19 1633  Resp 21 06/02/19 1633  SpO2 98 % 06/02/19 1633  Vitals shown include unvalidated device data.  Last Pain:  Vitals:   06/02/19 1626  TempSrc: Temporal  PainSc: 0-No pain      Patients Stated Pain Goal: 2 (74/71/85 5015)  Complications: No apparent anesthesia complications

## 2019-06-02 NOTE — Anesthesia Postprocedure Evaluation (Signed)
Anesthesia Post Note  Patient: Joan Mosley  Procedure(s) Performed: ENDOSCOPIC RETROGRADE CHOLANGIOPANCREATOGRAPHY (ERCP) WITH PROPOFOL (N/A )     Patient location during evaluation: PACU Anesthesia Type: General Level of consciousness: sedated Pain management: pain level controlled Vital Signs Assessment: post-procedure vital signs reviewed and stable Respiratory status: spontaneous breathing and respiratory function stable Cardiovascular status: stable Postop Assessment: no apparent nausea or vomiting Anesthetic complications: no    Last Vitals:  Vitals:   06/02/19 1636 06/02/19 1714  BP: 134/65 119/74  Pulse: 87 97  Resp: (!) 22   Temp:  36.4 C  SpO2: 95% 90%    Last Pain:  Vitals:   06/02/19 1714  TempSrc: Oral  PainSc:                  Tokiko Diefenderfer DANIEL

## 2019-06-03 DIAGNOSIS — K8050 Calculus of bile duct without cholangitis or cholecystitis without obstruction: Secondary | ICD-10-CM | POA: Diagnosis not present

## 2019-06-03 LAB — COMPREHENSIVE METABOLIC PANEL
ALT: 14 U/L (ref 0–44)
AST: 21 U/L (ref 15–41)
Albumin: 2.3 g/dL — ABNORMAL LOW (ref 3.5–5.0)
Alkaline Phosphatase: 113 U/L (ref 38–126)
Anion gap: 14 (ref 5–15)
BUN: 15 mg/dL (ref 8–23)
CO2: 22 mmol/L (ref 22–32)
Calcium: 10.1 mg/dL (ref 8.9–10.3)
Chloride: 101 mmol/L (ref 98–111)
Creatinine, Ser: 3.65 mg/dL — ABNORMAL HIGH (ref 0.44–1.00)
GFR calc Af Amer: 13 mL/min — ABNORMAL LOW (ref >60)
GFR calc non Af Amer: 11 mL/min — ABNORMAL LOW (ref >60)
Glucose, Bld: 123 mg/dL — ABNORMAL HIGH (ref 70–99)
Potassium: 4.1 mmol/L (ref 3.5–5.1)
Sodium: 137 mmol/L (ref 135–145)
Total Bilirubin: 0.7 mg/dL (ref 0.3–1.2)
Total Protein: 5.3 g/dL — ABNORMAL LOW (ref 6.5–8.1)

## 2019-06-03 LAB — CBC
HCT: 33.4 % — ABNORMAL LOW (ref 36.0–46.0)
Hemoglobin: 9.9 g/dL — ABNORMAL LOW (ref 12.0–15.0)
MCH: 28.4 pg (ref 26.0–34.0)
MCHC: 29.6 g/dL — ABNORMAL LOW (ref 30.0–36.0)
MCV: 96 fL (ref 80.0–100.0)
Platelets: 164 10*3/uL (ref 150–400)
RBC: 3.48 MIL/uL — ABNORMAL LOW (ref 3.87–5.11)
RDW: 17.4 % — ABNORMAL HIGH (ref 11.5–15.5)
WBC: 9.9 10*3/uL (ref 4.0–10.5)
nRBC: 0 % (ref 0.0–0.2)

## 2019-06-03 LAB — GLUCOSE, CAPILLARY
Glucose-Capillary: 104 mg/dL — ABNORMAL HIGH (ref 70–99)
Glucose-Capillary: 72 mg/dL (ref 70–99)
Glucose-Capillary: 99 mg/dL (ref 70–99)
Glucose-Capillary: 102 mg/dL — ABNORMAL HIGH (ref 70–99)

## 2019-06-03 MED ORDER — HEPARIN SODIUM (PORCINE) 1000 UNIT/ML IJ SOLN
INTRAMUSCULAR | Status: AC
  Administered 2019-06-03: 1000 [IU]
  Filled 2019-06-03: qty 4

## 2019-06-03 MED ORDER — MIDODRINE HCL 5 MG PO TABS
ORAL_TABLET | ORAL | Status: AC
  Filled 2019-06-03: qty 1

## 2019-06-03 MED ORDER — DARBEPOETIN ALFA 60 MCG/0.3ML IJ SOSY
60.0000 ug | PREFILLED_SYRINGE | INTRAMUSCULAR | Status: DC
  Administered 2019-06-06: 60 ug via INTRAVENOUS
  Filled 2019-06-03: qty 0.3

## 2019-06-03 NOTE — Progress Notes (Signed)
[facility] NEPHROLOGY PROGRESS NOTE  Assessment/ Plan: Pt is a 74 y.o. yo female with COPD, chronic respiratory failure, ESRD on HD anemia admitted with encephalopathy due to UTI and subdural hematoma.  Dialysis Orders: [facility]  4h  400/800  83.5kg  2/2 bath  P4  R IJ TDC  Hep none  - mircera 60 q2 last 3/20  - hect 4 ug  #Acute metabolic encephalopathy suspected due to UTI and SDH: Patient was alert awake today.  Repeat CT head on 5/5 with stable SDH.  On IV Rocephin for UTI.  # ESRD TTS: Dialysis today.  Tolerating well.  # Anemia of CKD: Resume ESA.  Hemoglobin 9.9.  # Secondary hyperparathyroidism: Phosphorus at goal.  Continue Renvela.  # HTN/volume: On midodrine for intradialytic hypotension.  BP variable.  Continue to monitor.  #Choledocholithiasis: Status post ERCP and biliary sphincterotomy and balloon extraction on 5/7.   Subjective: Seen and examined at dialysis unit.  She was alert awake.  Denies nausea vomiting chest pain shortness of breath.  However report chronic fatigue. Objective Vital signs in last 24 hours: Vitals:   06/03/19 0830 06/03/19 0845 06/03/19 0915 06/03/19 0945  BP: (!) 105/47 (!) 94/54 (!) 86/37   Pulse: 64 67 69 (P) 69  Resp: 17   (P) 17  Temp:      TempSrc:      SpO2:      Weight:       Weight change: -2.268 kg  Intake/Output Summary (Last 24 hours) at 06/03/2019 1024 Last data filed at 06/03/2019 0749 Gross per 24 hour  Intake 890 ml  Output 0 ml  Net 890 ml       Labs: Basic Metabolic Panel: Recent Labs  Lab 06/01/19 0357 06/02/19 0721 06/03/19 0533  NA 135 137 137  K 3.2* 4.0 4.1  CL 100 101 101  CO2 24 26 22   GLUCOSE 112* 99 123*  BUN 23 7* 15  CREATININE 4.38* 2.74* 3.65*  CALCIUM 8.5* 9.3 10.1  PHOS 5.5*  --   --    Liver Function Tests: Recent Labs  Lab 06/01/19 0357 06/02/19 0721 06/03/19 0533  AST 30 30 21   ALT 14 16 14   ALKPHOS 132* 128* 113  BILITOT 0.3 1.0 0.7  PROT 5.3* 5.2* 5.3*   ALBUMIN 2.3*  2.3* 2.2* 2.3*   Recent Labs  Lab 05/31/19 0520  LIPASE 22   No results for input(s): AMMONIA in the last 168 hours. CBC: Recent Labs  Lab 05/30/19 1325 05/30/19 1325 05/31/19 0520 05/31/19 0520 06/01/19 0357 06/02/19 0721 06/03/19 0533  WBC 18.3*   < > 16.4*   < > 15.9* 14.3* 9.9  NEUTROABS 14.5*  --   --   --   --   --   --   HGB 11.6*   < > 10.5*   < > 9.9* 10.7* 9.9*  HCT 39.2   < > 34.3*   < > 33.3* 35.2* 33.4*  MCV 95.4  --  94.0  --  94.3 94.1 96.0  PLT 152   < > 130*   < > 152 175 164   < > = values in this interval not displayed.   Cardiac Enzymes: No results for input(s): CKTOTAL, CKMB, CKMBINDEX, TROPONINI in the last 168 hours. CBG: Recent Labs  Lab 06/01/19 2029 06/02/19 0754 06/02/19 1155 06/02/19 1717 06/03/19 0751  GLUCAP 92 79 104* 131* 104*    Iron Studies: No results for input(s): IRON, TIBC,  TRANSFERRIN, FERRITIN in the last 72 hours. Studies/Results: DG ERCP BILIARY & PANCREATIC DUCTS  Result Date: 06/02/2019 CLINICAL DATA:  Choledocholithiasis. EXAM: ERCP TECHNIQUE: Multiple spot images obtained with the fluoroscopic device and submitted for interpretation post-procedure. COMPARISON:  CT of the abdomen on 05/30/2019 FINDINGS: Imaging with a C-arm demonstrates cannulation of the common bile duct with contrast injection demonstrating several well-circumscribed filling defects in the common bile duct. The duct appears mildly dilated. Balloon sweep maneuver was performed to extract calculi. IMPRESSION: Choledocholithiasis with calculi identified in the common bile duct. Balloon sweep maneuver was performed to extract calculi. These images were submitted for radiologic interpretation only. Please see the procedural report for the amount of contrast and the fluoroscopy time utilized. Electronically Signed   By: Aletta Edouard M.D.   On: 06/02/2019 16:35    Medications: Infusions: . cefTRIAXone (ROCEPHIN)  IV 1 g (06/03/19 0606)  .  metronidazole 500 mg (06/03/19 0758)    Scheduled Medications: . atorvastatin  40 mg Oral q AM  . Chlorhexidine Gluconate Cloth  6 each Topical Daily  . Chlorhexidine Gluconate Cloth  6 each Topical Q0600  . Chlorhexidine Gluconate Cloth  6 each Topical Q0600  . docusate sodium  100 mg Oral q morning - 10a  . FLUoxetine  10 mg Oral Daily  . heparin sodium (porcine)      . indomethacin  100 mg Rectal Once  . insulin aspart  0-6 Units Subcutaneous TID WC  . insulin detemir  6 Units Subcutaneous QHS  . levothyroxine  112 mcg Oral Q0600  . midodrine      . midodrine  10 mg Oral Q T,Th,S,Su  . montelukast  10 mg Oral QHS  . multivitamin  1 tablet Oral QHS  . pantoprazole  40 mg Oral QAC breakfast  . senna  1 tablet Oral q morning - 10a  . sevelamer carbonate  800 mg Oral TID WC    have reviewed scheduled and prn medications.  Physical Exam: General:NAD, comfortable Heart:RRR, s1s2 nl Lungs:clear b/l, no crackle Abdomen:soft, Non-tender, non-distended Extremities:No edema Dialysis Access: Right IJ TDC.  Audiel Scheiber Prasad Bubber Rothert 06/03/2019,10:24 AM  LOS: 3 days  Pager: [phone]

## 2019-06-03 NOTE — Progress Notes (Signed)
PROGRESS NOTE        PATIENT DETAILS Name: Joan Mosley Age: 74 y.o. Sex: female Date of Birth: 24-Jan-1946 Admit Date: 05/30/2019 Admitting Physician Rise Patience, MD RWE:[id number], Charlene Brooke, NP  Brief Narrative: Patient is a 74 y.o. 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    Significant events: 5/4>> admit to [facility] for confusion-found to have SDH, choledocholithiasis, UTI 4/8-4/12>> admit to [facility] for symptomatic anemia requiring PRBC transfusion  Antimicrobial therapy: Rocephin: 5/4>>5/8 Flagyl: 5/5>>5/8  Microbiology data: 5/4: Urine culture>> Enterobacter  Procedures/significant studies: 5/7: ERCP with sphincterotomy 5/6: CT left hip>> no osseous abnormality, circumferential rectal wall thickening, mild diffuse bladder wall thickening thickening, extensive sigmoid diverticulosis 5/4: CT abdomen/pelvis>> choledocholithiasis, diverticular disease, multiple ovarian cysts  Consults: GI, nephrology  DVT Prophylaxis : SCD's  Subjective: Denies any abdominal pain.  Lying comfortably in bed.    Assessment/Plan: Acute metabolic encephalopathy: Improved-suspect not far from baseline-no family at bedside.  Likely metabolic encephalopathy in the setting of UTI, SDH.  SDH: No focal deficits-repeat CT head on 5/5 shows stability.  ED MD on admission had spoken with neurosurgeon-Dr. Ricarda Frame advised no surgical intervention.  Choledocholithiasis: Did have some intermittent abdominal pain/nausea and vomiting on admission-currently without any abdominal pain.  Lipase levels normal-alk phos marginally elevated.  GI consulted-underwent ERCP with sphincterotomy and removal of  CBD stones on 5/7.  No obvious signs of cholangitis-we will go and stop both Rocephin and Flagyl today.    Rectal thickening seen incidentally on CT scan: GI does not advise further work-up-no clinical symptoms/signs consistent with proctitis.  Complicated UTI: Poor historian-does acknowledge intermittent dysuria-UA grossly abnormal-has leukocytosis which is slowly downtrending-completed treatment with IV Rocephin.  ESRD on HD MWF: Nephrology consulted for inpatient HD care.  COPD with chronic hypoxic respiratory failure on 5 L of oxygen/OSA: No wheezing-supportive care-continue bronchodilators.  Continue CPAP nightly  History of recent CVA: Nonfocal exam-antiplatelets contraindicated due to SDH.  Anemia: Likely secondary to ESRD-hemoglobin stable-continue to follow.  No indication for transfusion.  Hypothyroidism: Continue Synthroid  Hypotension: BP soft but stable-continue midodrine.  Pain around the left trochanter/hip area: CT left hip without any major abnormalities.  DM-2: CBGs stable-continue Lantus 6 units-and SSI.  Follow and adjust.    Recent Labs    06/03/19 0751 06/03/19 1312 06/03/19 1555  GLUCAP 104* 72 99   Anxiety/depression: At baseline-continue Prozac and Klonopin.  Suspected dementia: Family does acknowledge significant short-term memory issues-with significant decline over the past 6 months - 1 year.  Stable for outpatient follow-up with neurology/psychiatry.  Multiple bilateral ovarian cysts: Seen incidentally on CT scan-stable for outpatient work-up-outpatient pelvic ultrasound if felt warranted by PCP.  Chronic debility/deconditioning: Has been essentially bedbound for the past 2 years-Per patient's son-who I talked to on 5/5-PT was following at home with plans to stand and pivot over the next few days.  Obesity: Estimated body mass index is 34.39 kg/m as calculated from the following:   Height as of 05/16/19: 5' 1"  (1.549 m).   Weight as of this  encounter: 82.6 kg.    Diet: Diet Order  Diet renal/carb modified with fluid restriction Diet-HS Snack? Nothing; Fluid restriction: 1200 mL Fluid; Room service appropriate? No; Fluid consistency: Thin  Diet effective now               Code Status: Full code  Family Communication: Spoke with son-Ryan on 5/7-we will update over the next few days.  Disposition Plan: Status is: Inpatient  Remains inpatient appropriate because:Inpatient level of care appropriate due to severity of illness  Dispo: The patient is from: Home              Anticipated d/c is to: SNF              Anticipated d/c date is: 2 days              Patient currently is not medically stable to d/c.  Barriers to Discharge: SDH/encephalopathy  Antimicrobial agents: Anti-infectives (From admission, onward)   Start     Dose/Rate Route Frequency Ordered Stop   05/31/19 0715  metroNIDAZOLE (FLAGYL) IVPB 500 mg     500 mg 100 mL/hr over 60 Minutes Intravenous Every 8 hours 05/31/19 0709     05/31/19 0600  cefTRIAXone (ROCEPHIN) 1 g in sodium chloride 0.9 % 100 mL IVPB     1 g 200 mL/hr over 30 Minutes Intravenous Every 24 hours 05/31/19 0245     05/30/19 1730  cefTRIAXone (ROCEPHIN) 1 g in sodium chloride 0.9 % 100 mL IVPB     1 g 200 mL/hr over 30 Minutes Intravenous  Once 05/30/19 1724 05/30/19 1915       Time spent: 25 minutes-Greater than 50% of this time was spent in counseling, explanation of diagnosis, planning of further management, and coordination of care.  MEDICATIONS: Scheduled Meds: . atorvastatin  40 mg Oral q AM  . Chlorhexidine Gluconate Cloth  6 each Topical Daily  . Chlorhexidine Gluconate Cloth  6 each Topical Q0600  . Chlorhexidine Gluconate Cloth  6 each Topical Q0600  . [START ON 06/06/2019] darbepoetin (ARANESP) injection - DIALYSIS  60 mcg Intravenous Q Tue-HD  . docusate sodium  100 mg Oral q morning - 10a  . FLUoxetine  10 mg Oral Daily  . indomethacin  100 mg Rectal  Once  . insulin aspart  0-6 Units Subcutaneous TID WC  . insulin detemir  6 Units Subcutaneous QHS  . levothyroxine  112 mcg Oral Q0600  . midodrine      . midodrine  10 mg Oral Q T,Th,S,Su  . montelukast  10 mg Oral QHS  . multivitamin  1 tablet Oral QHS  . pantoprazole  40 mg Oral QAC breakfast  . senna  1 tablet Oral q morning - 10a  . sevelamer carbonate  800 mg Oral TID WC   Continuous Infusions: . cefTRIAXone (ROCEPHIN)  IV Stopped (06/03/19 0700)  . metronidazole 500 mg (06/03/19 0758)   PRN Meds:.albuterol, clonazePAM, clonazePAM, ipratropium-albuterol, ondansetron **OR** ondansetron (ZOFRAN) IV   PHYSICAL EXAM: Vital signs: Vitals:   06/03/19 1145 06/03/19 1150 06/03/19 1153 06/03/19 1300  BP: (!) 93/39 (!) 101/52 (!) 99/54 (!) 97/53  Pulse: (!) 58 69 (!) 57 70  Resp:   16 18  Temp:   97.6 F (36.4 C) 97.9 F (36.6 C)  TempSrc:   Axillary Axillary  SpO2:   98% 96%  Weight:   82.6 kg    Filed Weights   06/03/19 0340 06/03/19 0814 06/03/19 1153  Weight: 79.8 kg 83 kg 82.6 kg   Body mass  index is 34.39 kg/m.   Gen Exam:Alert awake-not in any distress HEENT:atraumatic, normocephalic Chest: B/L clear to auscultation anteriorly CVS:S1S2 regular Abdomen:soft non tender, non distended Extremities:no edema Neurology: Non focal Skin: no rash  I have personally reviewed following labs and imaging studies  LABORATORY DATA: CBC: Recent Labs  Lab 05/30/19 1325 05/31/19 0520 06/01/19 0357 06/02/19 0721 06/03/19 0533  WBC 18.3* 16.4* 15.9* 14.3* 9.9  NEUTROABS 14.5*  --   --   --   --   HGB 11.6* 10.5* 9.9* 10.7* 9.9*  HCT 39.2 34.3* 33.3* 35.2* 33.4*  MCV 95.4 94.0 94.3 94.1 96.0  PLT 152 130* 152 175 287    Basic Metabolic Panel: Recent Labs  Lab 05/30/19 1325 05/31/19 0520 06/01/19 0357 06/02/19 0721 06/03/19 0533  NA 132* 131* 135 137 137  K 3.8 3.5 3.2* 4.0 4.1  CL 90* 92* 100 101 101  CO2 23 21* 24 26 22   GLUCOSE 112* 91 112* 99 123*    BUN 31* 37* 23 7* 15  CREATININE 5.30* 5.78* 4.38* 2.74* 3.65*  CALCIUM 9.1 8.9 8.5* 9.3 10.1  PHOS  --   --  5.5*  --   --     GFR: Estimated Creatinine Clearance: 12.8 mL/min (A) (by C-G formula based on SCr of 3.65 mg/dL (H)).  Liver Function Tests: Recent Labs  Lab 05/30/19 1325 05/31/19 0520 06/01/19 0357 06/02/19 0721 06/03/19 0533  AST 31 29 30 30 21   ALT 17 16 14 16 14   ALKPHOS 142* 132* 132* 128* 113  BILITOT 0.8 0.7 0.3 1.0 0.7  PROT 5.8* 5.2* 5.3* 5.2* 5.3*  ALBUMIN 2.5* 2.2* 2.3*  2.3* 2.2* 2.3*   Recent Labs  Lab 05/31/19 0520  LIPASE 22   No results for input(s): AMMONIA in the last 168 hours.  Coagulation Profile: No results for input(s): INR, PROTIME in the last 168 hours.  Cardiac Enzymes: No results for input(s): CKTOTAL, CKMB, CKMBINDEX, TROPONINI in the last 168 hours.  BNP (last 3 results) Recent Labs    04/10/19 1418  PROBNP 81.0    Lipid Profile: No results for input(s): CHOL, HDL, LDLCALC, TRIG, CHOLHDL, LDLDIRECT in the last 72 hours.  Thyroid Function Tests: No results for input(s): TSH, T4TOTAL, FREET4, T3FREE, THYROIDAB in the last 72 hours.  Anemia Panel: No results for input(s): VITAMINB12, FOLATE, FERRITIN, TIBC, IRON, RETICCTPCT in the last 72 hours.  Urine analysis:    Component Value Date/Time   COLORURINE YELLOW 05/30/2019 1633   APPEARANCEUR TURBID (A) 05/30/2019 1633   LABSPEC 1.011 05/30/2019 1633   PHURINE 8.0 05/30/2019 1633   GLUCOSEU 150 (A) 05/30/2019 1633   HGBUR MODERATE (A) 05/30/2019 1633   BILIRUBINUR NEGATIVE 05/30/2019 1633   KETONESUR NEGATIVE 05/30/2019 1633   PROTEINUR 100 (A) 05/30/2019 1633   NITRITE NEGATIVE 05/30/2019 1633   LEUKOCYTESUR MODERATE (A) 05/30/2019 1633    Sepsis Labs: Lactic Acid, Venous    Component Value Date/Time   LATICACIDVEN 0.6 12/25/2018 0803    MICROBIOLOGY: Recent Results (from the past 240 hour(s))  Urine culture     Status: Abnormal   Collection Time:  05/30/19  2:59 PM   Specimen: Urine, Catheterized  Result Value Ref Range Status   Specimen Description URINE, CATHETERIZED  Final   Special Requests   Final    NONE Performed at [facility], [place] [address] [place], [place] [address]    Culture >=100,000 COLONIES/mL ENTEROBACTER AEROGENES (A)  Final   Report Status 06/01/2019  FINAL  Final   Organism ID, Bacteria ENTEROBACTER AEROGENES (A)  Final      Susceptibility   Enterobacter aerogenes - MIC*    CEFAZOLIN >=64 RESISTANT Resistant     CEFTRIAXONE <=1 SENSITIVE Sensitive     CIPROFLOXACIN <=0.25 SENSITIVE Sensitive     GENTAMICIN <=1 SENSITIVE Sensitive     IMIPENEM 1 SENSITIVE Sensitive     NITROFURANTOIN 64 INTERMEDIATE Intermediate     TRIMETH/SULFA <=20 SENSITIVE Sensitive     PIP/TAZO <=4 SENSITIVE Sensitive     * >=100,000 COLONIES/mL ENTEROBACTER AEROGENES  Respiratory Panel by RT PCR (Flu A&B, Covid) - Nasopharyngeal Swab     Status: None   Collection Time: 05/30/19  8:48 PM   Specimen: Nasopharyngeal Swab  Result Value Ref Range Status   SARS Coronavirus 2 by RT PCR NEGATIVE NEGATIVE Final    Comment: (NOTE) SARS-CoV-2 target nucleic acids are NOT DETECTED. The SARS-CoV-2 RNA is generally detectable in upper respiratoy specimens during the acute phase of infection. The lowest concentration of SARS-CoV-2 viral copies this assay can detect is 131 copies/mL. A negative result does not preclude SARS-Cov-2 infection and should not be used as the sole basis for treatment or other patient management decisions. A negative result may occur with  improper specimen collection/handling, submission of specimen other than nasopharyngeal swab, presence of viral mutation(s) within the areas targeted by this assay, and inadequate number of viral copies (<131 copies/mL). A negative result must be combined with clinical observations, patient history, and epidemiological information. The expected result is Negative. Fact Sheet  for Patients:  PinkCheek.be Fact Sheet for Healthcare Providers:  GravelBags.it This test is not yet ap proved or cleared by the [place] FDA and  has been authorized for detection and/or diagnosis of SARS-CoV-2 by FDA under an Emergency Use Authorization (EUA). This EUA will remain  in effect (meaning this test can be used) for the duration of the COVID-19 declaration under Section 564(b)(1) of the Act, 21 U.S.C. section 360bbb-3(b)(1), unless the authorization is terminated or revoked sooner.    Influenza A by PCR NEGATIVE NEGATIVE Final   Influenza B by PCR NEGATIVE NEGATIVE Final    Comment: (NOTE) The Xpert Xpress SARS-CoV-2/FLU/RSV assay is intended as an aid in  the diagnosis of influenza from Nasopharyngeal swab specimens and  should not be used as a sole basis for treatment. Nasal washings and  aspirates are unacceptable for Xpert Xpress SARS-CoV-2/FLU/RSV  testing. Fact Sheet for Patients: PinkCheek.be Fact Sheet for Healthcare Providers: GravelBags.it This test is not yet approved or cleared by the [place] FDA and  has been authorized for detection and/or diagnosis of SARS-CoV-2 by  FDA under an Emergency Use Authorization (EUA). This EUA will remain  in effect (meaning this test can be used) for the duration of the  Covid-19 declaration under Section 564(b)(1) of the Act, 21  U.S.C. section 360bbb-3(b)(1), unless the authorization is  terminated or revoked. Performed at [facility], [place] [address], [place], [place] [address]   MRSA PCR Screening     Status: None   Collection Time: 05/31/19  9:42 AM   Specimen: Nasopharyngeal  Result Value Ref Range Status   MRSA by PCR NEGATIVE NEGATIVE Final    Comment:        The GeneXpert MRSA Assay (FDA approved for NASAL specimens only), is one component of a comprehensive MRSA  colonization surveillance program. It is not intended to diagnose MRSA infection nor to guide or monitor treatment  for MRSA infections. Performed at [facility], [place] [address] [place], [place] [address]     RADIOLOGY STUDIES/RESULTS: DG ERCP BILIARY & PANCREATIC DUCTS  Result Date: 06/02/2019 CLINICAL DATA:  Choledocholithiasis. EXAM: ERCP TECHNIQUE: Multiple spot images obtained with the fluoroscopic device and submitted for interpretation post-procedure. COMPARISON:  CT of the abdomen on 05/30/2019 FINDINGS: Imaging with a C-arm demonstrates cannulation of the common bile duct with contrast injection demonstrating several well-circumscribed filling defects in the common bile duct. The duct appears mildly dilated. Balloon sweep maneuver was performed to extract calculi. IMPRESSION: Choledocholithiasis with calculi identified in the common bile duct. Balloon sweep maneuver was performed to extract calculi. These images were submitted for radiologic interpretation only. Please see the procedural report for the amount of contrast and the fluoroscopy time utilized. Electronically Signed   By: Aletta Edouard M.D.   On: 06/02/2019 16:35     LOS: 3 days   Oren Binet, MD  [facility]    To contact the attending provider between 7A-7P or the covering provider during after hours 7P-7A, please log into the web site [email or web address] and access using universal [place] password for that web site. If you do not have the password, please call the hospital operator.  06/03/2019, 4:02 PM

## 2019-06-03 NOTE — Progress Notes (Addendum)
[facility] Progress Note  CC:  ERCP yesterday  Assessment / Plan: S/p ERCP biliary sphincterotomy and balloon extraction 06/02/19 for choledocholithiasis     - identified incidentally on CT    - no associated symptoms    - not thought to be surgical candidate for cholecystectomy Acute metabolic encephalopathy due to UTI and SDH Mildly elevated liver enzymes, improving following ERCP Rectal wall thickening on non-contrast CT scan on admission    - unclear if this is collapsed rectum versus clinically significant    - no associated symptoms    - seen on CT scan without contrast 12/24/18 with associated stranding/thickening left ischioanal fat History of profound anemia without overt or occult bleeding, 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 Please call the on-call gastroenterologist with any additional questions or concerns during this hospitalization.    Subjective: No GI complaints.  No family present at the time of my evaluation.   Objective:  Vital signs in last 24 hours: Temp:  [97.4 F (36.3 C)-98.4 F (36.9 C)] 97.6 F (36.4 C) (05/08 1153) Pulse Rate:  [56-97] 57 (05/08 1153) Resp:  [16-25] 16 (05/08 1153) BP: (82-134)/(35-87) 99/54 (05/08 1153) SpO2:  [90 %-99 %] 98 % (05/08 1153) Weight:  [79.8 kg-83 kg] 82.6 kg (05/08 1153) Last BM Date: 06/03/19 General:   Alert, in NAD. Right IJ TDC. Abdomen:  Soft. Nontender. Nondistended. Normal bowel sounds. No rebound or guarding. LAD: No inguinal or umbilical LAD Extremities:   Without edema.   Intake/Output from previous day: 05/07 0701 - 05/08 0700 In: 650 [P.O.:100; I.V.:300; IV Piggyback:250] Out: 0  Intake/Output this shift: Total I/O In: 240 [P.O.:240] Out: 467 [Other:467]  Lab Results: Recent Labs    06/01/19 0357 06/02/19 0721 06/03/19 0533  WBC 15.9* 14.3* 9.9  HGB 9.9* 10.7* 9.9*  HCT 33.3* 35.2* 33.4*  PLT 152 175 164   BMET Recent Labs    06/01/19 0357 06/02/19 0721 06/03/19 0533  NA 135 137 137  K 3.2* 4.0 4.1  CL 100 101 101  CO2 24 26 22   GLUCOSE 112* 99 123*  BUN 23 7* 15  CREATININE 4.38* 2.74* 3.65*  CALCIUM 8.5* 9.3 10.1   LFT Recent Labs    06/01/19 0357 06/02/19 0721 06/03/19 0533  PROT 5.3*   < > 5.3*  ALBUMIN 2.3*  2.3*   < > 2.3*  AST 30   < > 21  ALT 14   < > 14  ALKPHOS 132*   < > 113  BILITOT 0.3   < > 0.7  BILIDIR 0.2  --   --   IBILI 0.1*  --   --    < > = values in this interval not displayed.   DG ERCP BILIARY & PANCREATIC DUCTS  Result Date: 06/02/2019 CLINICAL DATA:  Choledocholithiasis. EXAM: ERCP TECHNIQUE: Multiple spot images obtained with the fluoroscopic device and submitted for interpretation post-procedure. COMPARISON:  CT of the abdomen on 05/30/2019 FINDINGS: Imaging with a C-arm demonstrates cannulation  of the common bile duct with contrast injection demonstrating several well-circumscribed filling defects in the common bile duct. The duct appears mildly dilated. Balloon sweep maneuver was performed to extract calculi. IMPRESSION: Choledocholithiasis with calculi identified in the common bile duct. Balloon sweep maneuver was performed to extract calculi. These images were submitted for radiologic interpretation only. Please see the procedural report for the amount of contrast and the fluoroscopy time utilized. Electronically Signed   By: Aletta Edouard M.D.   On: 06/02/2019 16:35    Joan Mosley  06/03/2019, 1:05 PM

## 2019-06-04 LAB — COMPREHENSIVE METABOLIC PANEL
ALT: 12 U/L (ref 0–44)
AST: 21 U/L (ref 15–41)
Albumin: 2.3 g/dL — ABNORMAL LOW (ref 3.5–5.0)
Alkaline Phosphatase: 111 U/L (ref 38–126)
Anion gap: 10 (ref 5–15)
BUN: <5 mg/dL — ABNORMAL LOW (ref 8–23)
CO2: 28 mmol/L (ref 22–32)
Calcium: 9.7 mg/dL (ref 8.9–10.3)
Chloride: 101 mmol/L (ref 98–111)
Creatinine, Ser: 1.93 mg/dL — ABNORMAL HIGH (ref 0.44–1.00)
GFR calc Af Amer: 29 mL/min — ABNORMAL LOW (ref >60)
GFR calc non Af Amer: 25 mL/min — ABNORMAL LOW (ref >60)
Glucose, Bld: 74 mg/dL (ref 70–99)
Potassium: 3 mmol/L — ABNORMAL LOW (ref 3.5–5.1)
Sodium: 139 mmol/L (ref 135–145)
Total Bilirubin: 0.4 mg/dL (ref 0.3–1.2)
Total Protein: 5.1 g/dL — ABNORMAL LOW (ref 6.5–8.1)

## 2019-06-04 LAB — CBC
HCT: 34 % — ABNORMAL LOW (ref 36.0–46.0)
Hemoglobin: 10 g/dL — ABNORMAL LOW (ref 12.0–15.0)
MCH: 28.2 pg (ref 26.0–34.0)
MCHC: 29.4 g/dL — ABNORMAL LOW (ref 30.0–36.0)
MCV: 95.8 fL (ref 80.0–100.0)
Platelets: 194 10*3/uL (ref 150–400)
RBC: 3.55 MIL/uL — ABNORMAL LOW (ref 3.87–5.11)
RDW: 17.3 % — ABNORMAL HIGH (ref 11.5–15.5)
WBC: 12 10*3/uL — ABNORMAL HIGH (ref 4.0–10.5)
nRBC: 0 % (ref 0.0–0.2)

## 2019-06-04 LAB — GLUCOSE, CAPILLARY
Glucose-Capillary: 98 mg/dL (ref 70–99)
Glucose-Capillary: 97 mg/dL (ref 70–99)
Glucose-Capillary: 92 mg/dL (ref 70–99)

## 2019-06-04 MED ORDER — POTASSIUM CHLORIDE 20 MEQ PO PACK
20.0000 meq | PACK | Freq: Once | ORAL | Status: AC
  Administered 2019-06-04: 20 meq via ORAL
  Filled 2019-06-04: qty 1

## 2019-06-04 NOTE — Progress Notes (Signed)
                       PROGRESS NOTE        PATIENT DETAILS Name: Joan Mosley Age: 74 y.o. Sex: female Date of Birth: 02/21/1945 Admit Date: 05/30/2019 Admitting Physician Arshad N Kakrakandy, MD PCP:Nche, [name] Lum, NP  Brief Narrative: Patient is a 74 y.o. 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    Significant events: 5/4>> admit to [facility] for confusion-found to have SDH, choledocholithiasis, UTI 4/8-4/12>> admit to [facility] for symptomatic anemia requiring PRBC transfusion  Antimicrobial therapy: Rocephin: 5/4>>5/8 Flagyl: 5/5>>5/8  Microbiology data: 5/4: Urine culture>> Enterobacter  Procedures/significant studies: 5/7: ERCP with sphincterotomy 5/6: CT left hip>> no osseous abnormality, circumferential rectal wall thickening, mild diffuse bladder wall thickening thickening, extensive sigmoid diverticulosis 5/4: CT abdomen/pelvis>> choledocholithiasis, diverticular disease, multiple ovarian cysts  Consults: GI, nephrology  DVT Prophylaxis : SCD's  Subjective:  Patient in bed, appears comfortable, denies any headache, no fever, no chest pain or pressure, no shortness of breath , no abdominal pain. No focal weakness.   Assessment/Plan:  Acute metabolic encephalopathy: Improved-suspect not far from baseline-no family at bedside.  Likely metabolic encephalopathy in the setting of UTI, SDH.  SDH: No focal deficits-repeat CT head on 5/5 shows stability.  ED MD on admission had spoken with neurosurgeon - Dr. Ostergaard-who advised no surgical intervention.  Choledocholithiasis: Did have some intermittent abdominal pain/nausea and vomiting on admission-currently without any abdominal pain.   Lipase levels normal-alk phos marginally elevated.  GI consulted-underwent ERCP with sphincterotomy and removal of CBD stones on 5/7.  No obvious signs of cholangitis-we will go and stop both Rocephin and Flagyl today.  Per GI no surgery needed for now due to poor overall health condition.  Rectal thickening seen incidentally on CT scan: GI does not advise further work-up-no clinical symptoms/signs consistent with proctitis.  Complicated UTI: Poor historian-does acknowledge intermittent dysuria-UA grossly abnormal-has leukocytosis which is slowly downtrending-completed treatment with IV Rocephin.  ESRD on HD MWF: Nephrology consulted for inpatient HD care.  COPD with chronic hypoxic respiratory failure on 5 L of oxygen/OSA: No wheezing-supportive care-continue bronchodilators.  Continue CPAP nightly  History of recent CVA: Nonfocal exam-antiplatelets contraindicated due to SDH.  Anemia: Likely secondary to ESRD-hemoglobin stable-continue to follow.  No indication for transfusion.  Hypothyroidism: Continue Synthroid  Hypotension: BP soft but stable-continue midodrine.  Pain around the left trochanter/hip area: CT left hip without any major abnormalities.  Anxiety/depression: At baseline-continue Prozac and Klonopin.  Suspected dementia: Family does acknowledge significant short-term memory issues-with significant decline over the past 6 months - 1 year.  Stable for outpatient follow-up with neurology/psychiatry.  Multiple bilateral ovarian cysts: Seen incidentally on CT scan-stable for outpatient work-up-outpatient pelvic ultrasound if felt warranted by PCP.  Chronic debility/deconditioning: Has been essentially bedbound for the past 2 years-Per patient's son-who I talked to on 5/5-PT was following at home with plans to stand and pivot over the next few days.  DM-2: CBGs stable-continue Lantus 6 units-and SSI.  Follow and adjust.    Lab Results  Component Value Date   HGBA1C 5.5  04/28/2019   CBG (last 3)  Recent Labs    06/03/19 1555 06/03/19 2042 06/04/19 0757  GLUCAP 99   102* 98     Obesity: Estimated body mass index is 34.39 kg/m as calculated from the following:   Height as of 05/16/19: 5' 1" (1.549 m).   Weight as of this encounter: 82.6 kg.    Diet: Diet Order            Diet renal/carb modified with fluid restriction Diet-HS Snack? Nothing; Fluid restriction: 1200 mL Fluid; Room service appropriate? No; Fluid consistency: Thin  Diet effective now               Code Status: Full code  Family Communication:  Spoke with son-Ryan [phone] on 06/03/19 ( 24 hr supervision at home)  Disposition Plan: Status is: Inpatient  Remains inpatient appropriate because:Inpatient level of care appropriate due to severity of illness  Dispo: The patient is from: Home              Anticipated d/c is to: SNF/ TBD - out of SNF days, may need to go home with PT on Tuesday after her dialysis.  Discussed with son they may be able to provide 24-hour supervision at home.              Anticipated d/c date is: 2 days              Patient currently is not medically stable to d/c.  Barriers to Discharge: SDH/encephalopathy  Antimicrobial agents: Anti-infectives (From admission, onward)   Start     Dose/Rate Route Frequency Ordered Stop   05/31/19 0715  metroNIDAZOLE (FLAGYL) IVPB 500 mg     500 mg 100 mL/hr over 60 Minutes Intravenous Every 8 hours 05/31/19 0709     05/31/19 0600  cefTRIAXone (ROCEPHIN) 1 g in sodium chloride 0.9 % 100 mL IVPB     1 g 200 mL/hr over 30 Minutes Intravenous Every 24 hours 05/31/19 0245     05/30/19 1730  cefTRIAXone (ROCEPHIN) 1 g in sodium chloride 0.9 % 100 mL IVPB     1 g 200 mL/hr over 30 Minutes Intravenous  Once 05/30/19 1724 05/30/19 1915       Time spent: 25 minutes-Greater than 50% of this time was spent in counseling, explanation of diagnosis, planning of further management, and coordination of  care.  MEDICATIONS: Scheduled Meds: . atorvastatin  40 mg Oral q AM  . Chlorhexidine Gluconate Cloth  6 each Topical Daily  . Chlorhexidine Gluconate Cloth  6 each Topical Q0600  . Chlorhexidine Gluconate Cloth  6 each Topical Q0600  . [START ON 06/06/2019] darbepoetin (ARANESP) injection - DIALYSIS  60 mcg Intravenous Q Tue-HD  . docusate sodium  100 mg Oral q morning - 10a  . FLUoxetine  10 mg Oral Daily  . indomethacin  100 mg Rectal Once  . insulin aspart  0-6 Units Subcutaneous TID WC  . insulin detemir  6 Units Subcutaneous QHS  . levothyroxine  112 mcg Oral Q0600  . midodrine  10 mg Oral Q T,Th,S,Su  . montelukast  10 mg Oral QHS  . multivitamin  1 tablet Oral QHS  . pantoprazole  40 mg Oral QAC breakfast  . potassium chloride  20 mEq Oral Once  . senna  1 tablet Oral q morning - 10a  . sevelamer carbonate  800 mg Oral TID WC   Continuous Infusions: . cefTRIAXone (ROCEPHIN)  IV 1 g (06/04/19 0652)  . metronidazole 500 mg (06/04/19 0842)   PRN Meds:.albuterol, clonazePAM, clonazePAM, ipratropium-albuterol, ondansetron **OR** ondansetron (ZOFRAN) IV   PHYSICAL EXAM: Vital   signs: Vitals:   06/03/19 1900 06/04/19 0000 06/04/19 0400 06/04/19 0700  BP: (!) 85/47  (!) 107/59 (!) 102/52  Pulse:   71 77  Resp:   18 20  Temp: 98.1 F (36.7 C) 97.8 F (36.6 C) 97.8 F (36.6 C) 98.2 F (36.8 C)  TempSrc: Oral Oral Oral Oral  SpO2:   99% 97%  Weight:       Filed Weights   06/03/19 0340 06/03/19 0814 06/03/19 1153  Weight: 79.8 kg 83 kg 82.6 kg   Body mass index is 34.39 kg/m.   Exam:  Awake Alert, No new F.N deficits, Normal affect, wearing foot splits in both legs [place].AT,PERRAL Supple Neck,No JVD, No cervical lymphadenopathy appriciated.  Symmetrical Chest wall movement, Good air movement bilaterally, CTAB RRR,No Gallops, Rubs or new Murmurs, No Parasternal Heave +ve B.Sounds, Abd Soft, No tenderness, No organomegaly appriciated, No rebound - guarding or  rigidity. No Cyanosis,     I have personally reviewed following labs and imaging studies  LABORATORY DATA: CBC: Recent Labs  Lab 05/30/19 1325 05/30/19 1325 05/31/19 0520 06/01/19 0357 06/02/19 0721 06/03/19 0533 06/04/19 0523  WBC 18.3*   < > 16.4* 15.9* 14.3* 9.9 12.0*  NEUTROABS 14.5*  --   --   --   --   --   --   HGB 11.6*   < > 10.5* 9.9* 10.7* 9.9* 10.0*  HCT 39.2   < > 34.3* 33.3* 35.2* 33.4* 34.0*  MCV 95.4   < > 94.0 94.3 94.1 96.0 95.8  PLT 152   < > 130* 152 175 164 194   < > = values in this interval not displayed.    Basic Metabolic Panel: Recent Labs  Lab 05/31/19 0520 06/01/19 0357 06/02/19 0721 06/03/19 0533 06/04/19 0523  NA 131* 135 137 137 139  K 3.5 3.2* 4.0 4.1 3.0*  CL 92* 100 101 101 101  CO2 21* 24 26 22 28  GLUCOSE 91 112* 99 123* 74  BUN 37* 23 7* 15 <5*  CREATININE 5.78* 4.38* 2.74* 3.65* 1.93*  CALCIUM 8.9 8.5* 9.3 10.1 9.7  PHOS  --  5.5*  --   --   --     GFR: Estimated Creatinine Clearance: 24.2 mL/min (A) (by C-G formula based on SCr of 1.93 mg/dL (H)).  Liver Function Tests: Recent Labs  Lab 05/31/19 0520 06/01/19 0357 06/02/19 0721 06/03/19 0533 06/04/19 0523  AST 29 30 30 21 21  ALT 16 14 16 14 12  ALKPHOS 132* 132* 128* 113 111  BILITOT 0.7 0.3 1.0 0.7 0.4  PROT 5.2* 5.3* 5.2* 5.3* 5.1*  ALBUMIN 2.2* 2.3*  2.3* 2.2* 2.3* 2.3*   Recent Labs  Lab 05/31/19 0520  LIPASE 22   No results for input(s): AMMONIA in the last 168 hours.  Coagulation Profile: No results for input(s): INR, PROTIME in the last 168 hours.  Cardiac Enzymes: No results for input(s): CKTOTAL, CKMB, CKMBINDEX, TROPONINI in the last 168 hours.  BNP (last 3 results) Recent Labs    04/10/19 1418  PROBNP 81.0    Lipid Profile: No results for input(s): CHOL, HDL, LDLCALC, TRIG, CHOLHDL, LDLDIRECT in the last 72 hours.  Thyroid Function Tests: No results for input(s): TSH, T4TOTAL, FREET4, T3FREE, THYROIDAB in the last 72  hours.  Anemia Panel: No results for input(s): VITAMINB12, FOLATE, FERRITIN, TIBC, IRON, RETICCTPCT in the last 72 hours.  Urine analysis:    Component Value Date/Time   COLORURINE YELLOW 05/30/2019 1633     APPEARANCEUR TURBID (A) 05/30/2019 1633   LABSPEC 1.011 05/30/2019 1633   PHURINE 8.0 05/30/2019 1633   GLUCOSEU 150 (A) 05/30/2019 1633   HGBUR MODERATE (A) 05/30/2019 1633   BILIRUBINUR NEGATIVE 05/30/2019 1633   KETONESUR NEGATIVE 05/30/2019 1633   PROTEINUR 100 (A) 05/30/2019 1633   NITRITE NEGATIVE 05/30/2019 1633   LEUKOCYTESUR MODERATE (A) 05/30/2019 1633    Sepsis Labs: Lactic Acid, Venous    Component Value Date/Time   LATICACIDVEN 0.6 12/25/2018 0803    MICROBIOLOGY: Recent Results (from the past 240 hour(s))  Urine culture     Status: Abnormal   Collection Time: 05/30/19  2:59 PM   Specimen: Urine, Catheterized  Result Value Ref Range Status   Specimen Description URINE, CATHETERIZED  Final   Special Requests   Final    NONE Performed at [facility], [place] [address] [place], [place] [address]    Culture >=100,000 COLONIES/mL ENTEROBACTER AEROGENES (A)  Final   Report Status 06/01/2019 FINAL  Final   Organism ID, Bacteria ENTEROBACTER AEROGENES (A)  Final      Susceptibility   Enterobacter aerogenes - MIC*    CEFAZOLIN >=64 RESISTANT Resistant     CEFTRIAXONE <=1 SENSITIVE Sensitive     CIPROFLOXACIN <=0.25 SENSITIVE Sensitive     GENTAMICIN <=1 SENSITIVE Sensitive     IMIPENEM 1 SENSITIVE Sensitive     NITROFURANTOIN 64 INTERMEDIATE Intermediate     TRIMETH/SULFA <=20 SENSITIVE Sensitive     PIP/TAZO <=4 SENSITIVE Sensitive     * >=100,000 COLONIES/mL ENTEROBACTER AEROGENES  Respiratory Panel by RT PCR (Flu A&B, Covid) - Nasopharyngeal Swab     Status: None   Collection Time: 05/30/19  8:48 PM   Specimen: Nasopharyngeal Swab  Result Value Ref Range Status   SARS Coronavirus 2 by RT PCR NEGATIVE NEGATIVE Final    Comment: (NOTE) SARS-CoV-2  target nucleic acids are NOT DETECTED. The SARS-CoV-2 RNA is generally detectable in upper respiratoy specimens during the acute phase of infection. The lowest concentration of SARS-CoV-2 viral copies this assay can detect is 131 copies/mL. A negative result does not preclude SARS-Cov-2 infection and should not be used as the sole basis for treatment or other patient management decisions. A negative result may occur with  improper specimen collection/handling, submission of specimen other than nasopharyngeal swab, presence of viral mutation(s) within the areas targeted by this assay, and inadequate number of viral copies (<131 copies/mL). A negative result must be combined with clinical observations, patient history, and epidemiological information. The expected result is Negative. Fact Sheet for Patients:  PinkCheek.be Fact Sheet for Healthcare Providers:  GravelBags.it This test is not yet ap proved or cleared by the [place] FDA and  has been authorized for detection and/or diagnosis of SARS-CoV-2 by FDA under an Emergency Use Authorization (EUA). This EUA will remain  in effect (meaning this test can be used) for the duration of the COVID-19 declaration under Section 564(b)(1) of the Act, 21 U.S.C. section 360bbb-3(b)(1), unless the authorization is terminated or revoked sooner.    Influenza A by PCR NEGATIVE NEGATIVE Final   Influenza B by PCR NEGATIVE NEGATIVE Final    Comment: (NOTE) The Xpert Xpress SARS-CoV-2/FLU/RSV assay is intended as an aid in  the diagnosis of influenza from Nasopharyngeal swab specimens and  should not be used as a sole basis for treatment. Nasal washings and  aspirates are unacceptable for Xpert Xpress SARS-CoV-2/FLU/RSV  testing. Fact Sheet for Patients: PinkCheek.be Fact Sheet for Healthcare Providers:  https://www.fda.gov/media/142435/download This test is  not yet approved or cleared by the [place] FDA and  has been authorized for detection and/or diagnosis of SARS-CoV-2 by  FDA under an Emergency Use Authorization (EUA). This EUA will remain  in effect (meaning this test can be used) for the duration of the  Covid-19 declaration under Section 564(b)(1) of the Act, 21  U.S.C. section 360bbb-3(b)(1), unless the authorization is  terminated or revoked. Performed at [facility], [address] [place], [place] [address]   MRSA PCR Screening     Status: None   Collection Time: 05/31/19  9:42 AM   Specimen: Nasopharyngeal  Result Value Ref Range Status   MRSA by PCR NEGATIVE NEGATIVE Final    Comment:        The GeneXpert MRSA Assay (FDA approved for NASAL specimens only), is one component of a comprehensive MRSA colonization surveillance program. It is not intended to diagnose MRSA infection nor to guide or monitor treatment for MRSA infections. Performed at [facility], [address] [place],  [address]     RADIOLOGY STUDIES/RESULTS: DG ERCP BILIARY & PANCREATIC DUCTS  Result Date: 06/02/2019 CLINICAL DATA:  Choledocholithiasis. EXAM: ERCP TECHNIQUE: Multiple spot images obtained with the fluoroscopic device and submitted for interpretation post-procedure. COMPARISON:  CT of the abdomen on 05/30/2019 FINDINGS: Imaging with a C-arm demonstrates cannulation of the common bile duct with contrast injection demonstrating several well-circumscribed filling defects in the common bile duct. The duct appears mildly dilated. Balloon sweep maneuver was performed to extract calculi. IMPRESSION: Choledocholithiasis with calculi identified in the common bile duct. Balloon sweep maneuver was performed to extract calculi. These images were submitted for radiologic interpretation only. Please see the procedural report for the amount of contrast and the fluoroscopy time utilized. Electronically Signed   By: Glenn  Yamagata M.D.    On: 06/02/2019 16:35     LOS: 4 days   Signature    M.D on 06/04/2019 at 9:54 AM   -  To page go to [email or web address]      

## 2019-06-04 NOTE — Progress Notes (Signed)
Joan Mosley  Assessment/ Plan: Pt is a 74 y.o. yo female with COPD, chronic respiratory failure, ESRD on HD anemia admitted with encephalopathy due to UTI and subdural hematoma.  Dialysis Orders: [facility]  4h  400/800  83.5kg  2/2 bath  P4  R IJ TDC  Hep none  - mircera 60 q2 last 3/20  - hect 4 ug  #Acute metabolic encephalopathy suspected due to UTI and SDH: Awake, alert, seems back to her prev self; on ceftriaxone  # ESRD TTS on schedule  # Anemia of CKD: on ESA qTuesday.    # Secondary hyperparathyroidism: Continue Renvela.  # HTN/volume: On midodrine for intradialytic hypotension.  BP variable.  Continue to monitor.  #Choledocholithiasis: Status post ERCP and biliary sphincterotomy and balloon extraction on 5/7.   Subjective:   No c/o  HD yesterday, 0.5L UF  Objective Vital signs in last 24 hours: Vitals:   06/03/19 1900 06/04/19 0000 06/04/19 0400 06/04/19 0700  BP: (!) 85/47  (!) 107/59 (!) 102/52  Pulse:   71 77  Resp:   18 20  Temp: 98.1 F (36.7 C) 97.8 F (36.6 C) 97.8 F (36.6 C) 98.2 F (36.8 C)  TempSrc: Oral Oral Oral Oral  SpO2:   99% 97%  Weight:       Weight change: 3.175 kg  Intake/Output Summary (Last 24 hours) at 06/04/2019 0955 Last data filed at 06/03/2019 1700 Gross per 24 hour  Intake 240 ml  Output 467 ml  Net -227 ml       Labs: Basic Metabolic Panel: Recent Labs  Lab 06/01/19 0357 06/01/19 0357 06/02/19 0721 06/03/19 0533 06/04/19 0523  NA 135   < > 137 137 139  K 3.2*   < > 4.0 4.1 3.0*  CL 100   < > 101 101 101  CO2 24   < > 26 22 28   GLUCOSE 112*   < > 99 123* 74  BUN 23   < > 7* 15 <5*  CREATININE 4.38*   < > 2.74* 3.65* 1.93*  CALCIUM 8.5*   < > 9.3 10.1 9.7  PHOS 5.5*  --   --   --   --    < > = values in this interval not displayed.   Liver Function Tests: Recent Labs  Lab 06/02/19 0721 06/03/19 0533 06/04/19 0523  AST 30 21 21   ALT 16 14 12   ALKPHOS 128* 113  111  BILITOT 1.0 0.7 0.4  PROT 5.2* 5.3* 5.1*  ALBUMIN 2.2* 2.3* 2.3*   Recent Labs  Lab 05/31/19 0520  LIPASE 22   No results for input(s): AMMONIA in the last 168 hours. CBC: Recent Labs  Lab 05/30/19 1325 05/30/19 1325 05/31/19 0520 05/31/19 0520 06/01/19 0357 06/01/19 0357 06/02/19 0721 06/03/19 0533 06/04/19 0523  WBC 18.3*   < > 16.4*   < > 15.9*   < > 14.3* 9.9 12.0*  NEUTROABS 14.5*  --   --   --   --   --   --   --   --   HGB 11.6*   < > 10.5*   < > 9.9*   < > 10.7* 9.9* 10.0*  HCT 39.2   < > 34.3*   < > 33.3*   < > 35.2* 33.4* 34.0*  MCV 95.4   < > 94.0  --  94.3  --  94.1 96.0 95.8  PLT 152   < > 130*   < >  152   < > 175 164 194   < > = values in this interval not displayed.   Cardiac Enzymes: No results for input(s): CKTOTAL, CKMB, CKMBINDEX, TROPONINI in the last 168 hours. CBG: Recent Labs  Lab 06/03/19 0751 06/03/19 1312 06/03/19 1555 06/03/19 2042 06/04/19 0757  GLUCAP 104* 72 99 102* 98    Iron Studies: No results for input(s): IRON, TIBC, TRANSFERRIN, FERRITIN in the last 72 hours. Studies/Results: DG ERCP BILIARY & PANCREATIC DUCTS  Result Date: 06/02/2019 CLINICAL DATA:  Choledocholithiasis. EXAM: ERCP TECHNIQUE: Multiple spot images obtained with the fluoroscopic device and submitted for interpretation post-procedure. COMPARISON:  CT of the abdomen on 05/30/2019 FINDINGS: Imaging with a C-arm demonstrates cannulation of the common bile duct with contrast injection demonstrating several well-circumscribed filling defects in the common bile duct. The duct appears mildly dilated. Balloon sweep maneuver was performed to extract calculi. IMPRESSION: Choledocholithiasis with calculi identified in the common bile duct. Balloon sweep maneuver was performed to extract calculi. These images were submitted for radiologic interpretation only. Please see the procedural report for the amount of contrast and the fluoroscopy time utilized. Electronically Signed    By: Aletta Edouard M.D.   On: 06/02/2019 16:35    Medications: Infusions: . cefTRIAXone (ROCEPHIN)  IV 1 g (06/04/19 1607)  . metronidazole 500 mg (06/04/19 3710)    Scheduled Medications: . atorvastatin  40 mg Oral q AM  . Chlorhexidine Gluconate Cloth  6 each Topical Daily  . Chlorhexidine Gluconate Cloth  6 each Topical Q0600  . Chlorhexidine Gluconate Cloth  6 each Topical Q0600  . [START ON 06/06/2019] darbepoetin (ARANESP) injection - DIALYSIS  60 mcg Intravenous Q Tue-HD  . docusate sodium  100 mg Oral q morning - 10a  . FLUoxetine  10 mg Oral Daily  . indomethacin  100 mg Rectal Once  . insulin aspart  0-6 Units Subcutaneous TID WC  . insulin detemir  6 Units Subcutaneous QHS  . levothyroxine  112 mcg Oral Q0600  . midodrine  10 mg Oral Q T,Th,S,Su  . montelukast  10 mg Oral QHS  . multivitamin  1 tablet Oral QHS  . pantoprazole  40 mg Oral QAC breakfast  . potassium chloride  20 mEq Oral Once  . senna  1 tablet Oral q morning - 10a  . sevelamer carbonate  800 mg Oral TID WC    have reviewed scheduled and prn medications.  Physical Exam: General:NAD, comfortable Heart:RRR, s1s2 nl Lungs:clear b/l, no crackle Abdomen:soft, Non-tender, non-distended Extremities:No edema Dialysis Access: Right IJ TDC.  Joan Mosley 06/04/2019,9:55 AM  LOS: 4 days  Joan Mosley

## 2019-06-04 NOTE — Progress Notes (Signed)
   06/04/19 1100  Clinical Encounter Type  Visited With Health care provider  Visit Type Initial  Referral From Nurse  Consult/Referral To Chaplain   Chaplain is aware of need for notary. Notary will be available on Monday May 10th. Chaplain will post a not for notary to complete this paperwork.   Chaplain Resident, Evelene Croon, M Div [phone] on-call pager

## 2019-06-04 NOTE — Plan of Care (Signed)
Patient remains confused at times and currently on air bed for wound prevention.  Problem: Education: Goal: Knowledge of General Education information will improve Description: Including pain rating scale, medication(s)/side effects and non-pharmacologic comfort measures Outcome: Progressing   Problem: Clinical Measurements: Goal: Ability to maintain clinical measurements within normal limits will improve Outcome: Progressing Goal: Will remain free from infection Outcome: Progressing Goal: Diagnostic test results will improve Outcome: Progressing Goal: Respiratory complications will improve Outcome: Progressing Goal: Cardiovascular complication will be avoided Outcome: Progressing   Problem: Activity: Goal: Risk for activity intolerance will decrease Outcome: Progressing   Problem: Nutrition: Goal: Adequate nutrition will be maintained Outcome: Progressing   Problem: Coping: Goal: Level of anxiety will decrease Outcome: Progressing   Problem: Health Behavior/Discharge Planning: Goal: Ability to manage health-related needs will improve Outcome: Not Progressing   Problem: Skin Integrity: Goal: Risk for impaired skin integrity will decrease Outcome: Not Progressing   Problem: Health Behavior/Discharge Planning: Goal: Ability to manage health-related needs will improve Outcome: Not Progressing   Problem: Skin Integrity: Goal: Risk for impaired skin integrity will decrease Outcome: Not Progressing

## 2019-06-05 ENCOUNTER — Telehealth: Payer: Self-pay

## 2019-06-05 LAB — GLUCOSE, CAPILLARY
Glucose-Capillary: 97 mg/dL (ref 70–99)
Glucose-Capillary: 96 mg/dL (ref 70–99)
Glucose-Capillary: 106 mg/dL — ABNORMAL HIGH (ref 70–99)
Glucose-Capillary: 138 mg/dL — ABNORMAL HIGH (ref 70–99)
Glucose-Capillary: 82 mg/dL (ref 70–99)

## 2019-06-05 MED ORDER — CHLORHEXIDINE GLUCONATE CLOTH 2 % EX PADS
6.0000 | MEDICATED_PAD | Freq: Every day | CUTANEOUS | Status: DC
  Administered 2019-06-05 – 2019-06-07 (×3): 6 via TOPICAL

## 2019-06-05 NOTE — Progress Notes (Signed)
[facility] Progress Note   Subjective:   Patient seen in room. Reports she is feeling better but is hungry this AM. Denies SOB, orthopnea, PND, CP, palpitations, abdominal pain, N/V/D.   Objective Vitals:   06/05/19 0400 06/05/19 0440 06/05/19 0500 06/05/19 0806  BP: (!) 95/46  110/68 112/74  Pulse: 82  87 76  Resp: (!) 21  15 19   Temp: 97.9 F (36.6 C)  (!) 97.5 F (36.4 C) 98 F (36.7 C)  TempSrc: Oral  Oral   SpO2: 93%  90% 93%  Weight:  85.3 kg     Physical Exam General: Chronically ill appearing female, alert and in NAD Heart: RRR, No murmurs, rubs or gallops Lungs: CTA bilaterally, no wheezing, rhonchi or rales, on O2 Abdomen: Soft, non-tender, non-distended, +BS Extremities: No edema b/l lower extremities Dialysis Access:  R IJ [facility]  Additional Objective Labs: Basic Metabolic Panel: Recent Labs  Lab 06/01/19 0357 06/01/19 0357 06/02/19 0721 06/03/19 0533 06/04/19 0523  NA 135   < > 137 137 139  K 3.2*   < > 4.0 4.1 3.0*  CL 100   < > 101 101 101  CO2 24   < > 26 22 28   GLUCOSE 112*   < > 99 123* 74  BUN 23   < > 7* 15 <5*  CREATININE 4.38*   < > 2.74* 3.65* 1.93*  CALCIUM 8.5*   < > 9.3 10.1 9.7  PHOS 5.5*  --   --   --   --    < > = values in this interval not displayed.   Liver Function Tests: Recent Labs  Lab 06/02/19 0721 06/03/19 0533 06/04/19 0523  AST 30 21 21   ALT 16 14 12   ALKPHOS 128* 113 111  BILITOT 1.0 0.7 0.4  PROT 5.2* 5.3* 5.1*  ALBUMIN 2.2* 2.3* 2.3*   Recent Labs  Lab 05/31/19 0520  LIPASE 22   CBC: Recent Labs  Lab 05/30/19 1325 05/30/19 1325 05/31/19 0520 05/31/19 0520 06/01/19 0357 06/01/19 0357 06/02/19 0721 06/03/19 0533 06/04/19 0523  WBC 18.3*   < > 16.4*   < > 15.9*   < > 14.3* 9.9 12.0*  NEUTROABS 14.5*  --   --   --   --   --   --   --   --   HGB 11.6*   < > 10.5*   < > 9.9*   < > 10.7* 9.9* 10.0*  HCT 39.2   < > 34.3*   < > 33.3*   < > 35.2* 33.4* 34.0*  MCV 95.4   < > 94.0  --  94.3   --  94.1 96.0 95.8  PLT 152   < > 130*   < > 152   < > 175 164 194   < > = values in this interval not displayed.   Blood Culture    Component Value Date/Time   SDES URINE, CATHETERIZED 05/30/2019 1459   SPECREQUEST  05/30/2019 1459    NONE Performed at [facility] [address], [place], [place] [address]    CULT >=100,000 COLONIES/mL ENTEROBACTER AEROGENES (A) 05/30/2019 1459   REPTSTATUS 06/01/2019 FINAL 05/30/2019 1459    CBG: Recent Labs  Lab 06/03/19 2042 06/04/19 0757 06/04/19 1246 06/04/19 1714 06/05/19 0805  GLUCAP 102* 98 97 92 96   Medications: . cefTRIAXone (ROCEPHIN)  IV 1 g (06/05/19 0520)  . metronidazole 500 mg (06/05/19 0524)   . atorvastatin  40 mg Oral q AM  . Chlorhexidine Gluconate Cloth  6 each Topical Daily  . Chlorhexidine Gluconate Cloth  6 each Topical Q0600  . Chlorhexidine Gluconate Cloth  6 each Topical Q0600  . [START ON 06/06/2019] darbepoetin (ARANESP) injection - DIALYSIS  60 mcg Intravenous Q Tue-HD  . docusate sodium  100 mg Oral q morning - 10a  . FLUoxetine  10 mg Oral Daily  . indomethacin  100 mg Rectal Once  . insulin aspart  0-6 Units Subcutaneous TID WC  . insulin detemir  6 Units Subcutaneous QHS  . levothyroxine  112 mcg Oral Q0600  . midodrine  10 mg Oral Q T,Th,S,Su  . montelukast  10 mg Oral QHS  . multivitamin  1 tablet Oral QHS  . pantoprazole  40 mg Oral QAC breakfast  . senna  1 tablet Oral q morning - 10a  . sevelamer carbonate  800 mg Oral TID WC    Dialysis Orders: [facility] 4h 400/800 83.5kg 2/2 bath P4 R IJ TDC Hep none - mircera 60 q2 last 3/20 - hect 4 ug  Assessment/Plan: 1. Acute metabolic encephalopathy: Suspected to be due to UTI and SDH. On ceftriaxone. Awake and alert this AM, afebrile overnight. CT head 5/5 showed stability of SDH. Management per primary.  2. ESRD: Will continue TTS schedule. Last K+ 3.0 on 5/9,  Given dose of PO KCl, will repeat RFP. Use added K+ bath with HD  and replete K+ as needed.  3. HTN/volume:  BP controlled, no evidence of volume overload on exam. Continue midodrine with HD for BP support.  4. Anemia: Hgb 10.0. ESA ordered with next HD 5. Secondary hyperparathyroidism:  Corrected calcium high, hectorol on hold. Phos 5.5. Continue renvela.  6. Choledocholithiasis: Status post ERCP and biliary sphincterotomy and balloon extraction on 5/7.  Anice Paganini, PA-C 06/05/2019, 9:18 AM  [facility] Pager: [phone]

## 2019-06-05 NOTE — Telephone Encounter (Signed)
-----  Message from Yetta Flock, MD sent at 06/05/2019  7:35 AM EDT ----- Regarding: FW: Hospital follow-up Jan can you help coordinate a follow up visit with me or APPs in the next 4 weeks? Thanks ----- Message ----- From: Thornton Park, MD Sent: 06/03/2019   1:11 PM EDT To: Yetta Flock, MD Subject: Hospital follow-up                             Richardson Landry, you originally met this patient when she was hospitalized in April 2021 for a consultation of anemia. No iron deficiency or GI blood loss identified. She has been quite ill over the last 6 months. No endoscopy performed.  She was found to have CBD stones on CT scan during her most recent hospitalization. Dan performed ERCP with  biliary sphincterotomy and balloon extraction 06/02/19. Liver enzymes were only mildly elevated, but improved following the procedure.  Incidentally found to have rectal wall thickening on non-contrast CT scan on admission. No associated symptoms. Unclear if clinically significant. Outpatient follow-up recommended.   Will defer timing for outpatient follow-up to you.  Thanks.  KLB

## 2019-06-05 NOTE — Progress Notes (Signed)
Chaplain spoke with Ms. Lenhardt son.  He would like to get some paperwork notarized that is not healthcare related.  Chaplain let him know that the notary would not be able to notarize anything outside of healthcare POA.  Son understood.  Chaplain will follow-up as needed.

## 2019-06-05 NOTE — Telephone Encounter (Signed)
Called and Left message for pt to call back to schedule a Hospital F/U visit for anemia. Dr. Havery Moros has time on 6-19.

## 2019-06-05 NOTE — Progress Notes (Signed)
PROGRESS NOTE        PATIENT DETAILS Name: Joan Mosley Age: 74 y.o. Sex: female Date of Birth: 02/11/1945 Admit Date: 05/30/2019 Admitting Physician Rise Patience, MD BSW:HQPR, Charlene Brooke, NP  Brief Narrative: Patient is a 74 y.o. 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    Significant events: 5/4>> admit to [facility] for confusion-found to have SDH, choledocholithiasis, UTI 4/8-4/12>> admit to [facility] for symptomatic anemia requiring PRBC transfusion  Antimicrobial therapy: Rocephin: 5/4>>5/8 Flagyl: 5/5>>5/8  Microbiology data: 5/4: Urine culture>> Enterobacter  Procedures/significant studies: 5/7: ERCP with sphincterotomy 5/6: CT left hip>> no osseous abnormality, circumferential rectal wall thickening, mild diffuse bladder wall thickening thickening, extensive sigmoid diverticulosis 5/4: CT abdomen/pelvis>> choledocholithiasis, diverticular disease, multiple ovarian cysts  Consults: GI, nephrology  DVT Prophylaxis : SCD's  Subjective:  Patient in bed, appears comfortable, denies any headache, no fever, no chest pain or pressure, no shortness of breath , no abdominal pain. No focal weakness.  Assessment/Plan:  Acute metabolic encephalopathy: Improved-suspect not far from baseline-no family at bedside.  Likely metabolic encephalopathy in the setting of UTI, SDH.  SDH: No focal deficits-repeat CT head on 5/5 shows stability.  ED MD on admission had spoken with neurosurgeon - Dr. Ricarda Frame advised no surgical intervention.  Choledocholithiasis: Did have some intermittent abdominal pain/nausea and vomiting on admission-currently without any abdominal pain.  Lipase  levels normal-alk phos marginally elevated.  GI consulted-underwent ERCP with sphincterotomy and removal of CBD stones on 5/7.  No obvious signs of cholangitis, finished antibiotics continue monitoring closely.  Per GI no surgery needed for now due to poor overall health condition.  Rectal thickening seen incidentally on CT scan: GI does not advise further work-up-no clinical symptoms/signs consistent with proctitis.  Complicated UTI: Poor historian-does acknowledge intermittent dysuria-UA grossly abnormal-has leukocytosis which is slowly downtrending-completed treatment with IV Rocephin.  ESRD on HD TTS: Nephrology consulted for inpatient HD care.  COPD with chronic hypoxic respiratory failure on 3-5 L of oxygen/OSA: No wheezing-supportive care-continue bronchodilators.  Continue CPAP nightly  History of recent CVA: Nonfocal exam-antiplatelets contraindicated due to SDH.  Anemia: Likely secondary to ESRD-hemoglobin stable-continue to follow.  No indication for transfusion.  Hypothyroidism: Continue Synthroid  Hypotension: BP soft but stable-continue midodrine.  Pain around the left trochanter/hip area: CT left hip without any major abnormalities.  Anxiety/depression: At baseline-continue Prozac and Klonopin.  Suspected dementia: Family does acknowledge significant short-term memory issues-with significant decline over the past 6 months - 1 year.  Stable for outpatient follow-up with neurology/psychiatry.  Multiple bilateral ovarian cysts: Seen incidentally on CT scan-stable for outpatient work-up-outpatient pelvic ultrasound if felt warranted by PCP.  Chronic debility/deconditioning: Has been essentially bedbound for the past 2 years-Per patient's son-who I talked to on 5/5-PT was following at home with plans to stand and pivot over the next few days.  DM-2: CBGs stable-continue Lantus 6 units-and SSI.  Follow and adjust.    Lab Results  Component Value Date   HGBA1C 5.5 04/28/2019     CBG (last 3)  Recent Labs    06/04/19 1714 06/05/19 0805 06/05/19 1156  GLUCAP 92 96 106*  Obesity: Estimated body mass index is 35.52 kg/m as calculated from the following:   Height as of 05/16/19: 5' 1"  (1.549 m).   Weight as of this encounter: 85.3 kg.    Diet: Diet Order            Diet renal/carb modified with fluid restriction Diet-HS Snack? Nothing; Fluid restriction: 1200 mL Fluid; Room service appropriate? No; Fluid consistency: Thin  Diet effective now               Code Status: Full code  Family Communication:  Spoke with son-Ryan [phone] on 06/03/19 ( 24 hr supervision at home)  Disposition Plan: Status is: Inpatient  Remains inpatient appropriate because:Inpatient level of care appropriate due to severity of illness  Dispo: The patient is from: Home              Anticipated d/c is to: SNF/ TBD - out of SNF days, may need to go home with PT on Wednesday after her dialysis on Tuesday.  Discussed with son they may be able to provide 24-hour supervision at home.              Anticipated d/c date is: 2 days              Patient currently is not medically stable to d/c.  Barriers to Discharge: SDH/encephalopathy  Antimicrobial agents: Anti-infectives (From admission, onward)   Start     Dose/Rate Route Frequency Ordered Stop   05/31/19 0715  metroNIDAZOLE (FLAGYL) IVPB 500 mg     500 mg 100 mL/hr over 60 Minutes Intravenous Every 8 hours 05/31/19 0709     05/31/19 0600  cefTRIAXone (ROCEPHIN) 1 g in sodium chloride 0.9 % 100 mL IVPB     1 g 200 mL/hr over 30 Minutes Intravenous Every 24 hours 05/31/19 0245     05/30/19 1730  cefTRIAXone (ROCEPHIN) 1 g in sodium chloride 0.9 % 100 mL IVPB     1 g 200 mL/hr over 30 Minutes Intravenous  Once 05/30/19 1724 05/30/19 1915       Time spent: 25 minutes-Greater than 50% of this time was spent in counseling, explanation of diagnosis, planning of further management, and coordination of  care.  MEDICATIONS: Scheduled Meds: . atorvastatin  40 mg Oral q AM  . Chlorhexidine Gluconate Cloth  6 each Topical Q0600  . [START ON 06/06/2019] darbepoetin (ARANESP) injection - DIALYSIS  60 mcg Intravenous Q Tue-HD  . docusate sodium  100 mg Oral q morning - 10a  . FLUoxetine  10 mg Oral Daily  . indomethacin  100 mg Rectal Once  . insulin aspart  0-6 Units Subcutaneous TID WC  . insulin detemir  6 Units Subcutaneous QHS  . levothyroxine  112 mcg Oral Q0600  . midodrine  10 mg Oral Q T,Th,S,Su  . montelukast  10 mg Oral QHS  . multivitamin  1 tablet Oral QHS  . pantoprazole  40 mg Oral QAC breakfast  . senna  1 tablet Oral q morning - 10a  . sevelamer carbonate  800 mg Oral TID WC   Continuous Infusions: . cefTRIAXone (ROCEPHIN)  IV 1 g (06/05/19 0520)  . metronidazole 500 mg (06/05/19 0524)   PRN Meds:.albuterol, clonazePAM, clonazePAM, ipratropium-albuterol, ondansetron **OR** ondansetron (ZOFRAN) IV   PHYSICAL EXAM: Vital signs: Vitals:   06/05/19 0440 06/05/19 0500 06/05/19 0806 06/05/19 1146  BP:  110/68 112/74 116/66  Pulse:  87 76 80  Resp:  15 19 20   Temp:  Marland Kitchen)  97.5 F (36.4 C) 98 F (36.7 C) 98.1 F (36.7 C)  TempSrc:  Oral  Oral  SpO2:  90% 93% 92%  Weight: 85.3 kg      Filed Weights   06/03/19 0814 06/03/19 1153 06/05/19 0440  Weight: 83 kg 82.6 kg 85.3 kg   Body mass index is 35.52 kg/m.   Exam:  Awake Alert, No new F.N deficits, Normal affect,   [place].AT,PERRAL Supple Neck,No JVD, No cervical lymphadenopathy appriciated.  Symmetrical Chest wall movement, Good air movement bilaterally, CTAB RRR,No Gallops, Rubs or new Murmurs, No Parasternal Heave +ve B.Sounds, Abd Soft, No tenderness, No organomegaly appriciated, No rebound - guarding or rigidity. wearing foot splits in both legs    I have personally reviewed following labs and imaging studies  LABORATORY DATA: CBC: Recent Labs  Lab 05/30/19 1325 05/30/19 1325 05/31/19 0520  06/01/19 0357 06/02/19 0721 06/03/19 0533 06/04/19 0523  WBC 18.3*   < > 16.4* 15.9* 14.3* 9.9 12.0*  NEUTROABS 14.5*  --   --   --   --   --   --   HGB 11.6*   < > 10.5* 9.9* 10.7* 9.9* 10.0*  HCT 39.2   < > 34.3* 33.3* 35.2* 33.4* 34.0*  MCV 95.4   < > 94.0 94.3 94.1 96.0 95.8  PLT 152   < > 130* 152 175 164 194   < > = values in this interval not displayed.    Basic Metabolic Panel: Recent Labs  Lab 06/01/19 0357 06/02/19 0721 06/03/19 0533 06/04/19 0523 06/05/19 0931  NA 135 137 137 139 139  K 3.2* 4.0 4.1 3.0* 3.2*  CL 100 101 101 101 104  CO2 24 26 22 28 24   GLUCOSE 112* 99 123* 74 99  BUN 23 7* 15 <5* 14  CREATININE 4.38* 2.74* 3.65* 1.93* 4.01*  CALCIUM 8.5* 9.3 10.1 9.7 9.4  PHOS 5.5*  --   --   --  4.0    GFR: Estimated Creatinine Clearance: 11.8 mL/min (A) (by C-G formula based on SCr of 4.01 mg/dL (H)).  Liver Function Tests: Recent Labs  Lab 05/31/19 0520 05/31/19 0520 06/01/19 0357 06/02/19 0721 06/03/19 0533 06/04/19 0523 06/05/19 0931  AST 29  --  30 30 21 21   --   ALT 16  --  14 16 14 12   --   ALKPHOS 132*  --  132* 128* 113 111  --   BILITOT 0.7  --  0.3 1.0 0.7 0.4  --   PROT 5.2*  --  5.3* 5.2* 5.3* 5.1*  --   ALBUMIN 2.2*   < > 2.3*  2.3* 2.2* 2.3* 2.3* 2.3*   < > = values in this interval not displayed.   Recent Labs  Lab 05/31/19 0520  LIPASE 22   No results for input(s): AMMONIA in the last 168 hours.  Coagulation Profile: No results for input(s): INR, PROTIME in the last 168 hours.  Cardiac Enzymes: No results for input(s): CKTOTAL, CKMB, CKMBINDEX, TROPONINI in the last 168 hours.  BNP (last 3 results) Recent Labs    04/10/19 1418  PROBNP 81.0    Lipid Profile: No results for input(s): CHOL, HDL, LDLCALC, TRIG, CHOLHDL, LDLDIRECT in the last 72 hours.  Thyroid Function Tests: No results for input(s): TSH, T4TOTAL, FREET4, T3FREE, THYROIDAB in the last 72 hours.  Anemia Panel: No results for input(s):  VITAMINB12, FOLATE, FERRITIN, TIBC, IRON, RETICCTPCT in the last 72 hours.  Urine analysis:  Component Value Date/Time   COLORURINE YELLOW 05/30/2019 1633   APPEARANCEUR TURBID (A) 05/30/2019 1633   LABSPEC 1.011 05/30/2019 1633   PHURINE 8.0 05/30/2019 1633   GLUCOSEU 150 (A) 05/30/2019 1633   HGBUR MODERATE (A) 05/30/2019 1633   BILIRUBINUR NEGATIVE 05/30/2019 1633   KETONESUR NEGATIVE 05/30/2019 1633   PROTEINUR 100 (A) 05/30/2019 1633   NITRITE NEGATIVE 05/30/2019 1633   LEUKOCYTESUR MODERATE (A) 05/30/2019 1633    Sepsis Labs: Lactic Acid, Venous    Component Value Date/Time   LATICACIDVEN 0.6 12/25/2018 0803    MICROBIOLOGY: Recent Results (from the past 240 hour(s))  Urine culture     Status: Abnormal   Collection Time: 05/30/19  2:59 PM   Specimen: Urine, Catheterized  Result Value Ref Range Status   Specimen Description URINE, CATHETERIZED  Final   Special Requests   Final    NONE Performed at [facility], [place] [address], [place], [place] [address]    Culture >=100,000 COLONIES/mL ENTEROBACTER AEROGENES (A)  Final   Report Status 06/01/2019 FINAL  Final   Organism ID, Bacteria ENTEROBACTER AEROGENES (A)  Final      Susceptibility   Enterobacter aerogenes - MIC*    CEFAZOLIN >=64 RESISTANT Resistant     CEFTRIAXONE <=1 SENSITIVE Sensitive     CIPROFLOXACIN <=0.25 SENSITIVE Sensitive     GENTAMICIN <=1 SENSITIVE Sensitive     IMIPENEM 1 SENSITIVE Sensitive     NITROFURANTOIN 64 INTERMEDIATE Intermediate     TRIMETH/SULFA <=20 SENSITIVE Sensitive     PIP/TAZO <=4 SENSITIVE Sensitive     * >=100,000 COLONIES/mL ENTEROBACTER AEROGENES  Respiratory Panel by RT PCR (Flu A&B, Covid) - Nasopharyngeal Swab     Status: None   Collection Time: 05/30/19  8:48 PM   Specimen: Nasopharyngeal Swab  Result Value Ref Range Status   SARS Coronavirus 2 by RT PCR NEGATIVE NEGATIVE Final    Comment: (NOTE) SARS-CoV-2 target nucleic acids are NOT DETECTED. The  SARS-CoV-2 RNA is generally detectable in upper respiratoy specimens during the acute phase of infection. The lowest concentration of SARS-CoV-2 viral copies this assay can detect is 131 copies/mL. A negative result does not preclude SARS-Cov-2 infection and should not be used as the sole basis for treatment or other patient management decisions. A negative result may occur with  improper specimen collection/handling, submission of specimen other than nasopharyngeal swab, presence of viral mutation(s) within the areas targeted by this assay, and inadequate number of viral copies (<131 copies/mL). A negative result must be combined with clinical observations, patient history, and epidemiological information. The expected result is Negative. Fact Sheet for Patients:  PinkCheek.be Fact Sheet for Healthcare Providers:  GravelBags.it This test is not yet ap proved or cleared by the [place] FDA and  has been authorized for detection and/or diagnosis of SARS-CoV-2 by FDA under an Emergency Use Authorization (EUA). This EUA will remain  in effect (meaning this test can be used) for the duration of the COVID-19 declaration under Section 564(b)(1) of the Act, 21 U.S.C. section 360bbb-3(b)(1), unless the authorization is terminated or revoked sooner.    Influenza A by PCR NEGATIVE NEGATIVE Final   Influenza B by PCR NEGATIVE NEGATIVE Final    Comment: (NOTE) The Xpert Xpress SARS-CoV-2/FLU/RSV assay is intended as an aid in  the diagnosis of influenza from Nasopharyngeal swab specimens and  should not be used as a sole basis for treatment. Nasal washings and  aspirates are unacceptable for Xpert Xpress SARS-CoV-2/FLU/RSV  testing. Fact Sheet for Patients: PinkCheek.be Fact Sheet for Healthcare Providers: GravelBags.it This test is not yet approved or cleared by the [place] FDA and  has been authorized for detection and/or diagnosis of SARS-CoV-2 by  FDA under an Emergency Use Authorization (EUA). This EUA will remain  in effect (meaning this test can be used) for the duration of the  Covid-19 declaration under Section 564(b)(1) of the Act, 21  U.S.C. section 360bbb-3(b)(1), unless the authorization is  terminated or revoked. Performed at [facility], [place] [address], [place], [place] [address]   MRSA PCR Screening     Status: None   Collection Time: 05/31/19  9:42 AM   Specimen: Nasopharyngeal  Result Value Ref Range Status   MRSA by PCR NEGATIVE NEGATIVE Final    Comment:        The GeneXpert MRSA Assay (FDA approved for NASAL specimens only), is one component of a comprehensive MRSA colonization surveillance program. It is not intended to diagnose MRSA infection nor to guide or monitor treatment for MRSA infections. Performed at [facility], [place] [address], [place], [place] [address]     RADIOLOGY STUDIES/RESULTS: No results found.   LOS: 5 days   Signature  Lala Lund M.D on 06/05/2019 at 11:58 AM   -  To page go to [email or web address]

## 2019-06-06 LAB — RENAL FUNCTION PANEL
Albumin: 2.3 g/dL — ABNORMAL LOW (ref 3.5–5.0)
Albumin: 2.2 g/dL — ABNORMAL LOW (ref 3.5–5.0)
Anion gap: 11 (ref 5–15)
Anion gap: 15 (ref 5–15)
BUN: 14 mg/dL (ref 8–23)
BUN: 21 mg/dL (ref 8–23)
CO2: 24 mmol/L (ref 22–32)
CO2: 24 mmol/L (ref 22–32)
Calcium: 9.4 mg/dL (ref 8.9–10.3)
Calcium: 9.5 mg/dL (ref 8.9–10.3)
Chloride: 104 mmol/L (ref 98–111)
Chloride: 101 mmol/L (ref 98–111)
Creatinine, Ser: 4.01 mg/dL — ABNORMAL HIGH (ref 0.44–1.00)
Creatinine, Ser: 4.75 mg/dL — ABNORMAL HIGH (ref 0.44–1.00)
GFR calc Af Amer: 12 mL/min — ABNORMAL LOW (ref >60)
GFR calc Af Amer: 10 mL/min — ABNORMAL LOW (ref >60)
GFR calc non Af Amer: 10 mL/min — ABNORMAL LOW (ref >60)
GFR calc non Af Amer: 8 mL/min — ABNORMAL LOW (ref >60)
Glucose, Bld: 99 mg/dL (ref 70–99)
Glucose, Bld: 118 mg/dL — ABNORMAL HIGH (ref 70–99)
Phosphorus: 4 mg/dL (ref 2.5–4.6)
Phosphorus: 3.9 mg/dL (ref 2.5–4.6)
Potassium: 3.2 mmol/L — ABNORMAL LOW (ref 3.5–5.1)
Potassium: 3 mmol/L — ABNORMAL LOW (ref 3.5–5.1)
Sodium: 139 mmol/L (ref 135–145)
Sodium: 140 mmol/L (ref 135–145)

## 2019-06-06 LAB — GLUCOSE, CAPILLARY
Glucose-Capillary: 62 mg/dL — ABNORMAL LOW (ref 70–99)
Glucose-Capillary: 62 mg/dL — ABNORMAL LOW (ref 70–99)
Glucose-Capillary: 82 mg/dL (ref 70–99)
Glucose-Capillary: 102 mg/dL — ABNORMAL HIGH (ref 70–99)
Glucose-Capillary: 114 mg/dL — ABNORMAL HIGH (ref 70–99)
Glucose-Capillary: 115 mg/dL — ABNORMAL HIGH (ref 70–99)

## 2019-06-06 LAB — CBC
HCT: 33.9 % — ABNORMAL LOW (ref 36.0–46.0)
Hemoglobin: 9.9 g/dL — ABNORMAL LOW (ref 12.0–15.0)
MCH: 28.4 pg (ref 26.0–34.0)
MCHC: 29.2 g/dL — ABNORMAL LOW (ref 30.0–36.0)
MCV: 97.4 fL (ref 80.0–100.0)
Platelets: 193 10*3/uL (ref 150–400)
RBC: 3.48 MIL/uL — ABNORMAL LOW (ref 3.87–5.11)
RDW: 17.7 % — ABNORMAL HIGH (ref 11.5–15.5)
WBC: 12.1 10*3/uL — ABNORMAL HIGH (ref 4.0–10.5)
nRBC: 0 % (ref 0.0–0.2)

## 2019-06-06 MED ORDER — HEPARIN SODIUM (PORCINE) 1000 UNIT/ML IJ SOLN
INTRAMUSCULAR | Status: AC
  Administered 2019-06-06: 4000 [IU]
  Filled 2019-06-06: qty 4

## 2019-06-06 MED ORDER — MIDODRINE HCL 5 MG PO TABS
10.0000 mg | ORAL_TABLET | Freq: Every day | ORAL | Status: DC
  Administered 2019-06-07: 10 mg via ORAL
  Filled 2019-06-06: qty 2

## 2019-06-06 MED ORDER — POTASSIUM CHLORIDE CRYS ER 20 MEQ PO TBCR: 40.0000 meq | EXTENDED_RELEASE_TABLET | Freq: Once | ORAL | Status: DC

## 2019-06-06 MED ORDER — DARBEPOETIN ALFA 60 MCG/0.3ML IJ SOSY
PREFILLED_SYRINGE | INTRAMUSCULAR | Status: AC
  Filled 2019-06-06: qty 0.3

## 2019-06-06 MED ORDER — MIDODRINE HCL 5 MG PO TABS
ORAL_TABLET | ORAL | Status: AC
  Filled 2019-06-06: qty 2

## 2019-06-06 MED ORDER — ACETAMINOPHEN 325 MG PO TABS
650.0000 mg | ORAL_TABLET | Freq: Four times a day (QID) | ORAL | Status: DC | PRN
  Administered 2019-06-06 (×2): 650 mg via ORAL
  Filled 2019-06-06 (×2): qty 2

## 2019-06-06 NOTE — Progress Notes (Signed)
Physical Therapy Treatment Patient Details Name: Joan Mosley MRN: [id number] DOB: 1945-02-05 Today's Date: 06/06/2019    History of Present Illness 74 y.o. female with onset of acute encephalopathy and AMS was admitted, noted UTI, pleural effusion, had small SDH, has wounds on skin and cholechondrolithiasis with stones s/p ERCP 5/7.  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 Pt tolerated EOB sitting for ~5 minutes, limited by initial dizziness which improved with sustained sitting and RUE propping on elbow. Pt requiring max assist +1-2 for bed mobility at this time, PT continuing to recommend SNF level of care post-acutely but per notes family wants pt home. If so, PT recommends HHPT, aid. Will continue to follow acutely.    Follow Up Recommendations  SNF     Equipment Recommendations  None recommended by PT    Recommendations for Other Services       Precautions / Restrictions Precautions Precautions: Fall Restrictions Weight Bearing Restrictions: No    Mobility  Bed Mobility Overal bed mobility: Needs Assistance Bed Mobility: Rolling;Sidelying to Sit;Sit to Supine Rolling: Mod assist Sidelying to sit: Max assist;HOB elevated   Sit to supine: Max assist;+2 for physical assistance;+2 for safety/equipment;HOB elevated   General bed mobility comments: Mod assist for roll to R side for truncal translation with use of bedrails, max assist +1-2 for supine<>sit for trunk and LE management, scooting up in bed with use of bed pads.  Transfers                 General transfer comment: unable this day  Ambulation/Gait             General Gait Details: unable this day   Stairs             Wheelchair Mobility    Modified Rankin (Stroke Patients Only)       Balance Overall balance assessment: Needs assistance Sitting-balance support: Bilateral upper extremity supported Sitting balance-Leahy Scale: Fair Sitting balance - Comments: able to sit unsupported for ~1 minute, props on RUE as needed to recover fatigue. sat EOB x5 minutes, session limited by transport arriving to take pt to dialysis Postural control: Right lateral lean                                  Cognition Arousal/Alertness: Awake/alert Behavior During Therapy: Flat affect Overall Cognitive Status: Impaired/Different from baseline Area of Impairment: Memory;Following commands;Awareness;Problem solving;Attention                   Current Attention Level: Sustained Memory: Decreased short-term memory Following Commands: Follows one step commands with increased time   Awareness: Intellectual Problem Solving: Slow processing;Decreased initiation;Difficulty sequencing;Requires verbal cues;Requires tactile cues General Comments: Increased time to respond to questions, very flat affect. Step-by-step sequencing assist for mobility required.      Exercises      General Comments General comments (skin integrity, edema, etc.): VSS during mobility, 4LO2 via [place]      Pertinent Vitals/Pain Pain Assessment: No/denies pain Pain Intervention(s): Limited activity within patient's tolerance    Home Living  Prior Function            PT Goals (current goals can now be found in the care plan section) Acute Rehab PT Goals Patient Stated Goal: to go home PT Goal Formulation: With patient Time For Goal Achievement: 06/16/19 Potential to Achieve Goals: Fair Progress towards PT goals: Progressing toward goals    Frequency    Min 2X/week      PT Plan Current plan remains appropriate    Co-evaluation              AM-PAC PT "6 Clicks"  Mobility   Outcome Measure  Help needed turning from your back to your side while in a flat bed without using bedrails?: A Lot Help needed moving from lying on your back to sitting on the side of a flat bed without using bedrails?: Total Help needed moving to and from a bed to a chair (including a wheelchair)?: Total Help needed standing up from a chair using your arms (Mosley.g., wheelchair or bedside chair)?: Total Help needed to walk in hospital room?: Total Help needed climbing 3-5 steps with a railing? : Total 6 Click Score: 7    End of Session Equipment Utilized During Treatment: Oxygen Activity Tolerance: Patient limited by fatigue;Other (comment)(limited by dialysis transport arrival) Patient left: in bed;with call bell/phone within reach;with nursing/sitter in room Nurse Communication: Mobility status PT Visit Diagnosis: Muscle weakness (generalized) (M62.81);Other abnormalities of gait and mobility (R26.89)     Time: 5701-7793 PT Time Calculation (min) (ACUTE ONLY): 12 min  Charges:  $Therapeutic Activity: 8-22 mins                     Joan Mosley, PT Acute Rehabilitation Services Pager [phone]  Office [phone]    Joan Mosley 06/06/2019, 10:44 AM

## 2019-06-06 NOTE — Progress Notes (Signed)
[facility] Progress Note   Subjective:   Seen just post- dialysis.  Sleepy this PM.    Objective Vitals:   06/06/19 1349 06/06/19 1400 06/06/19 1401 06/06/19 1427  BP: 95/70  100/71   Pulse: 91   98  Resp:  (!) 21  20  Temp: 97.8 F (36.6 C)     TempSrc: Oral     SpO2:    92%  Weight:       Physical Exam General: Chronically ill appearing female, alert and in NAD Heart: RRR, No murmurs, rubs or gallops Lungs: CTA bilaterally, no wheezing, rhonchi or rales, on O2 Abdomen: Soft, non-tender, non-distended, +BS Extremities: No edema b/l lower extremities Dialysis Access:  R IJ [facility]  Additional Objective Labs: Basic Metabolic Panel: Recent Labs  Lab 06/01/19 0357 06/02/19 0721 06/04/19 0523 06/05/19 0931 06/06/19 1200  NA 135   < > 139 139 140  K 3.2*   < > 3.0* 3.2* 3.0*  CL 100   < > 101 104 101  CO2 24   < > 28 24 24   GLUCOSE 112*   < > 74 99 118*  BUN 23   < > <5* 14 21  CREATININE 4.38*   < > 1.93* 4.01* 4.75*  CALCIUM 8.5*   < > 9.7 9.4 9.5  PHOS 5.5*  --   --  4.0 3.9   < > = values in this interval not displayed.   Liver Function Tests: Recent Labs  Lab 06/02/19 0721 06/02/19 0721 06/03/19 0533 06/03/19 0533 06/04/19 0523 06/05/19 0931 06/06/19 1200  AST 30  --  21  --  21  --   --   ALT 16  --  14  --  12  --   --   ALKPHOS 128*  --  113  --  111  --   --   BILITOT 1.0  --  0.7  --  0.4  --   --   PROT 5.2*  --  5.3*  --  5.1*  --   --   ALBUMIN 2.2*   < > 2.3*   < > 2.3* 2.3* 2.2*   < > = values in this interval not displayed.   Recent Labs  Lab 05/31/19 0520  LIPASE 22   CBC: Recent Labs  Lab 06/01/19 0357 06/01/19 0357 06/02/19 0721 06/02/19 0721 06/03/19 0533 06/04/19 0523 06/06/19 1200  WBC 15.9*   < > 14.3*   < > 9.9 12.0* 12.1*  HGB 9.9*   < > 10.7*   < > 9.9* 10.0* 9.9*  HCT 33.3*   < > 35.2*   < > 33.4* 34.0* 33.9*  MCV 94.3  --  94.1  --  96.0 95.8 97.4  PLT 152   < > 175   < > 164 194 193   < > =  values in this interval not displayed.   Blood Culture    Component Value Date/Time   SDES URINE, CATHETERIZED 05/30/2019 1459   SPECREQUEST  05/30/2019 1459    NONE Performed at [facility] [address] [place], [place] [address]    CULT >=100,000 COLONIES/mL ENTEROBACTER AEROGENES (A) 05/30/2019 1459   REPTSTATUS 06/01/2019 FINAL 05/30/2019 1459    CBG: Recent Labs  Lab 06/05/19 2040 06/06/19 0711 06/06/19 0728 06/06/19 0810 06/06/19 0936  GLUCAP 82 62* 62* 82 102*   Medications:  . atorvastatin  40 mg Oral q AM  . Chlorhexidine Gluconate Cloth  6 each Topical Q0600  . darbepoetin (ARANESP) injection - DIALYSIS  60 mcg Intravenous Q Tue-HD  . docusate sodium  100 mg Oral q morning - 10a  . FLUoxetine  10 mg Oral Daily  . insulin aspart  0-6 Units Subcutaneous TID WC  . levothyroxine  112 mcg Oral Q0600  . [START ON 06/07/2019] midodrine  10 mg Oral Daily  . montelukast  10 mg Oral QHS  . multivitamin  1 tablet Oral QHS  . pantoprazole  40 mg Oral QAC breakfast  . senna  1 tablet Oral q morning - 10a  . sevelamer carbonate  800 mg Oral TID WC    Dialysis Orders: [facility] 4h 400/800 83.5kg 2/2 bath P4 R IJ TDC Hep none - mircera 60 q2 last 3/20 - hect 4 ug  Assessment/Plan: 1. Acute metabolic encephalopathy: Suspected to be due to UTI and SDH. On ceftriaxone. Awake and alert this AM, afebrile overnight. CT head 5/5 showed stability of SDH. Management per primary.  2. ESRD: Will continue TTS schedule. Last K+ 3.0 on 5/9,  Given dose of PO KCl, will repeat RFP. Use added K+ bath with HD and replete K+ as needed.  3. HTN/volume:  BP controlled, no evidence of volume overload on exam. Continue midodrine with HD for BP support.  4. Anemia: Hgb 10.0. ESA ordered 5. Secondary hyperparathyroidism:  Corrected calcium high, hectorol on hold. Phos 5.5. Continue renvela.  6. Choledocholithiasis: Status post ERCP and biliary sphincterotomy and balloon  extraction on 5/7.  Madelon Lips 06/06/2019, 2:42 PM  [facility] Pager: [phone]

## 2019-06-06 NOTE — TOC Progression Note (Addendum)
Transition of Care [facility]) - Progression Note    Patient Details  Name: Joan Mosley MRN: [id number] Date of Birth: 1945/12/10  [facility] CM/SW Contact  Maryclare Labrador, RN Phone Number: 06/06/2019, 10:00 AM  Clinical Narrative:  Pt from home with adult children - pt recently discharged from [place].   CM informed that pt/family chooses to take pt home with [facility] and are now refusing SNF. CM spoke with both sons [name] and [name] - both confirmed the plan is for pt to return home with [name] and Kenna Gilbert.  CM also confirmed with pt's daughter Kenna Gilbert of plan to discharge home.  Family interested in re-establishing [place] with [facility] - CM provided medicare.gov information.  Alvis Lemmings accepts referral for [facility] pending orders.  Per family pt already has all needed equipment in the home and they will provide 24/7 care for pt.  Pt will need to discharge home via [facility].   CM informed Mateo Flow with [facility] that pt will now discharge home instead of back to [facility].  [facility] to begin process of medicare coverage of transportation from home to HD sessions. Family informed CM that they  will  secure wheelchair Lucianne Lei transportation until [place] can be approved by insurance (Danna has already arranged this before PTA).  Pts son [name] given Corey Harold contact information to follow up.   Address confirmed in Epic to be correct.  Daughter request order for hoyer lift pad, daughter confirms pt has already received one and therefore CM informed daughter that it may not be covered under insurance - daughter acknowledges    Expected Discharge Plan: [place] Barriers to Discharge: Continued Medical Work up  Expected Discharge Plan and Services Expected Discharge Plan: [place] In-house Referral: Clinical Social Work   Post Acute Care Choice: [place] arrangements for the past 2 months: [place]: RN, PT, OT, Nurse's Aide,  Social Work, Refused SNF [place] Agency: [place] Date [place]: 06/06/19 Time HH Agency Contacted: 1000 Representative spoke with at [place]: [place] ([place]) Interventions    Readmission Risk Interventions Readmission Risk Prevention Plan 05/31/2019 10/04/2018  Transportation Screening Complete Complete  Medication Review Press photographer) Complete Complete  PCP or Specialist appointment within 3-5 days of discharge Complete Complete  HRI or [place] Complete Complete  SW Recovery Care/Counseling Consult Complete Complete  Palliative Care Screening Not Applicable Complete  [place] Patient Refused Patient Refused

## 2019-06-06 NOTE — Plan of Care (Signed)
  Problem: Clinical Measurements: Goal: Ability to maintain clinical measurements within normal limits will improve Outcome: Progressing   Problem: Clinical Measurements: Goal: Respiratory complications will improve Outcome: Progressing   Problem: Clinical Measurements: Goal: Cardiovascular complication will be avoided Outcome: Progressing   

## 2019-06-06 NOTE — Progress Notes (Signed)
PROGRESS NOTE        PATIENT DETAILS Name: Joan Mosley Age: 74 y.o. Sex: female Date of Birth: 1945/08/21 Admit Date: 05/30/2019 Admitting Physician Rise Patience, MD XAJ:OINO, Charlene Brooke, NP  Brief Narrative: Patient is a 74 y.o. 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    Significant events: 5/4>> admit to [facility] for confusion-found to have SDH, choledocholithiasis, UTI 4/8-4/12>> admit to [facility] for symptomatic anemia requiring PRBC transfusion  Antimicrobial therapy: Rocephin: 5/4>>5/8 Flagyl: 5/5>>5/8  Microbiology data: 5/4: Urine culture>> Enterobacter  Procedures/significant studies: 5/7: ERCP with sphincterotomy 5/6: CT left hip>> no osseous abnormality, circumferential rectal wall thickening, mild diffuse bladder wall thickening thickening, extensive sigmoid diverticulosis 5/4: CT abdomen/pelvis>> choledocholithiasis, diverticular disease, multiple ovarian cysts  Consults: GI, nephrology  DVT Prophylaxis : SCD's  Subjective:  Patient in bed, appears comfortable, denies any headache, no fever, no chest pain or pressure, no shortness of breath , no abdominal pain. No focal weakness.   Assessment/Plan:  Acute metabolic encephalopathy: Improved-suspect not far from baseline-no family at bedside.  Likely metabolic encephalopathy in the setting of UTI, SDH.  SDH: No focal deficits-repeat CT head on 5/5 shows stability.  ED MD on admission had spoken with neurosurgeon - Dr. Ricarda Frame advised no surgical intervention.  Choledocholithiasis: Did have some intermittent abdominal pain/nausea and vomiting on admission-currently without any abdominal pain.   Lipase levels normal-alk phos marginally elevated.  GI consulted-underwent ERCP with sphincterotomy and removal of CBD stones on 5/7.  No obvious signs of cholangitis, finished antibiotics continue monitoring closely.  Per GI no surgery needed for now due to poor overall health condition.  Rectal thickening seen incidentally on CT scan: GI does not advise further work-up-no clinical symptoms/signs consistent with proctitis.  Complicated UTI: Poor historian-does acknowledge intermittent dysuria-UA grossly abnormal-has leukocytosis which is slowly downtrending-completed treatment with IV Rocephin.  ESRD on HD TTS: Nephrology consulted for inpatient HD care.  COPD with chronic hypoxic respiratory failure on 3-5 L of oxygen/OSA: No wheezing-supportive care-continue bronchodilators.  Continue CPAP nightly  History of recent CVA: Nonfocal exam-antiplatelets contraindicated due to SDH.  Anemia: Likely secondary to ESRD-hemoglobin stable-continue to follow.  No indication for transfusion.  Hypothyroidism: Continue Synthroid  Hypotension: BP soft patient asymptomatic, switched midodrine to daily instead of TTS schedule.  Pain around the left trochanter/hip area: CT left hip without any major abnormalities.  Anxiety/depression: At baseline-continue Prozac and Klonopin.  Suspected dementia: Family does acknowledge significant short-term memory issues-with significant decline over the past 6 months - 1 year.  Stable for outpatient follow-up with neurology/psychiatry.  Multiple bilateral ovarian cysts: Seen incidentally on CT scan-stable for outpatient work-up-outpatient pelvic ultrasound if felt warranted by PCP.  Chronic debility/deconditioning: Has been essentially bedbound for the past 2 years-Per patient's son-who I talked to on 5/5-PT was following at home with plans to stand and pivot over the next few days.  DM-2: CBGs stable-continue Lantus 6 units-and SSI.  Follow and adjust.    Lab  Results  Component Value Date   HGBA1C 5.5 04/28/2019   CBG (last 3)  Recent Labs    06/06/19 0728 06/06/19 0810 06/06/19 6767  GLUCAP 62* 82 102*     Obesity: Estimated body mass index is 33.07 kg/m as calculated from the following:   Height as of 05/16/19: 5' 1"  (1.549 m).   Weight as of this encounter: 79.4 kg.    Diet: Diet Order            Diet renal/carb modified with fluid restriction Diet-HS Snack? Nothing; Fluid restriction: 1200 mL Fluid; Room service appropriate? No; Fluid consistency: Thin  Diet effective now               Code Status: Full code  Family Communication:  Spoke with son-Ryan [phone] on 06/03/19 ( 24 hr supervision at home)  Disposition Plan: Status is: Inpatient  Remains inpatient appropriate because:Inpatient level of care appropriate due to severity of illness  Dispo: The patient is from: Home              Anticipated d/c is to: SNF/ TBD - out of SNF days, may need to go home with PT on Wednesday after her dialysis on Tuesday.  Discussed with son they may be able to provide 24-hour supervision at home.              Anticipated d/c date is: 2 days              Patient currently is not medically stable to d/c.  Barriers to Discharge: SDH/encephalopathy  Antimicrobial agents: Anti-infectives (From admission, onward)   Start     Dose/Rate Route Frequency Ordered Stop   05/31/19 0715  metroNIDAZOLE (FLAGYL) IVPB 500 mg  Status:  Discontinued     500 mg 100 mL/hr over 60 Minutes Intravenous Every 8 hours 05/31/19 0709 06/05/19 1541   05/31/19 0600  cefTRIAXone (ROCEPHIN) 1 g in sodium chloride 0.9 % 100 mL IVPB  Status:  Discontinued     1 g 200 mL/hr over 30 Minutes Intravenous Every 24 hours 05/31/19 0245 06/05/19 1541   05/30/19 1730  cefTRIAXone (ROCEPHIN) 1 g in sodium chloride 0.9 % 100 mL IVPB     1 g 200 mL/hr over 30 Minutes Intravenous  Once 05/30/19 1724 05/30/19 1915       Time spent: 25 minutes-Greater than 50% of  this time was spent in counseling, explanation of diagnosis, planning of further management, and coordination of care.  MEDICATIONS: Scheduled Meds: . atorvastatin  40 mg Oral q AM  . Chlorhexidine Gluconate Cloth  6 each Topical Q0600  . darbepoetin (ARANESP) injection - DIALYSIS  60 mcg Intravenous Q Tue-HD  . docusate sodium  100 mg Oral q morning - 10a  . FLUoxetine  10 mg Oral Daily  . insulin aspart  0-6 Units Subcutaneous TID WC  . levothyroxine  112 mcg Oral Q0600  . [START ON 06/07/2019] midodrine  10 mg Oral Daily  . montelukast  10 mg Oral QHS  . multivitamin  1 tablet Oral QHS  . pantoprazole  40 mg Oral QAC breakfast  . senna  1 tablet Oral q morning - 10a  . sevelamer carbonate  800 mg Oral TID WC   Continuous Infusions:  PRN Meds:.acetaminophen, albuterol, clonazePAM, clonazePAM, ipratropium-albuterol, [DISCONTINUED] ondansetron **OR** ondansetron (ZOFRAN) IV   PHYSICAL EXAM: Vital signs: Vitals:   06/06/19 1100 06/06/19 1130 06/06/19 1200 06/06/19 1230  BP: 115/62 (!) 102/53 (!) 96/36 (!) 38/38  Pulse: 78 83 84 82  Resp: 18     Temp:      TempSrc:      SpO2:  Weight:       Filed Weights   06/05/19 0440 06/06/19 0400 06/06/19 0901  Weight: 85.3 kg 79.4 kg 79.4 kg   Body mass index is 33.07 kg/m.   Exam:  Awake Alert, No new F.N deficits, Normal affect [place].AT,PERRAL Supple Neck,No JVD, No cervical lymphadenopathy appriciated.  Symmetrical Chest wall movement, Good air movement bilaterally, CTAB RRR,No Gallops, Rubs or new Murmurs, No Parasternal Heave +ve B.Sounds, Abd Soft, No tenderness, No organomegaly appriciated, No rebound - guarding or rigidity. wearing foot splits in both legs    I have personally reviewed following labs and imaging studies  LABORATORY DATA: CBC: Recent Labs  Lab 06/01/19 0357 06/02/19 0721 06/03/19 0533 06/04/19 0523 06/06/19 1200  WBC 15.9* 14.3* 9.9 12.0* 12.1*  HGB 9.9* 10.7* 9.9* 10.0* 9.9*  HCT 33.3*  35.2* 33.4* 34.0* 33.9*  MCV 94.3 94.1 96.0 95.8 97.4  PLT 152 175 164 194 096    Basic Metabolic Panel: Recent Labs  Lab 06/01/19 0357 06/01/19 0357 06/02/19 0721 06/03/19 0533 06/04/19 0523 06/05/19 0931 06/06/19 1200  NA 135   < > 137 137 139 139 140  K 3.2*   < > 4.0 4.1 3.0* 3.2* 3.0*  CL 100   < > 101 101 101 104 101  CO2 24   < > 26 22 28 24 24   GLUCOSE 112*   < > 99 123* 74 99 118*  BUN 23   < > 7* 15 <5* 14 21  CREATININE 4.38*   < > 2.74* 3.65* 1.93* 4.01* 4.75*  CALCIUM 8.5*   < > 9.3 10.1 9.7 9.4 9.5  PHOS 5.5*  --   --   --   --  4.0 3.9   < > = values in this interval not displayed.    GFR: Estimated Creatinine Clearance: 9.6 mL/min (A) (by C-G formula based on SCr of 4.75 mg/dL (H)).  Liver Function Tests: Recent Labs  Lab 05/31/19 0520 05/31/19 0520 06/01/19 0357 06/01/19 0357 06/02/19 0721 06/03/19 0533 06/04/19 0523 06/05/19 0931 06/06/19 1200  AST 29  --  30  --  30 21 21   --   --   ALT 16  --  14  --  16 14 12   --   --   ALKPHOS 132*  --  132*  --  128* 113 111  --   --   BILITOT 0.7  --  0.3  --  1.0 0.7 0.4  --   --   PROT 5.2*  --  5.3*  --  5.2* 5.3* 5.1*  --   --   ALBUMIN 2.2*   < > 2.3*  2.3*   < > 2.2* 2.3* 2.3* 2.3* 2.2*   < > = values in this interval not displayed.   Recent Labs  Lab 05/31/19 0520  LIPASE 22   No results for input(s): AMMONIA in the last 168 hours.  Coagulation Profile: No results for input(s): INR, PROTIME in the last 168 hours.  Cardiac Enzymes: No results for input(s): CKTOTAL, CKMB, CKMBINDEX, TROPONINI in the last 168 hours.  BNP (last 3 results) Recent Labs    04/10/19 1418  PROBNP 81.0    Lipid Profile: No results for input(s): CHOL, HDL, LDLCALC, TRIG, CHOLHDL, LDLDIRECT in the last 72 hours.  Thyroid Function Tests: No results for input(s): TSH, T4TOTAL, FREET4, T3FREE, THYROIDAB in the last 72 hours.  Anemia Panel: No results for input(s): VITAMINB12, FOLATE, FERRITIN, TIBC, IRON,  RETICCTPCT in the  last 72 hours.  Urine analysis:    Component Value Date/Time   COLORURINE YELLOW 05/30/2019 1633   APPEARANCEUR TURBID (A) 05/30/2019 1633   LABSPEC 1.011 05/30/2019 1633   PHURINE 8.0 05/30/2019 1633   GLUCOSEU 150 (A) 05/30/2019 1633   HGBUR MODERATE (A) 05/30/2019 1633   BILIRUBINUR NEGATIVE 05/30/2019 1633   KETONESUR NEGATIVE 05/30/2019 1633   PROTEINUR 100 (A) 05/30/2019 1633   NITRITE NEGATIVE 05/30/2019 1633   LEUKOCYTESUR MODERATE (A) 05/30/2019 1633    Sepsis Labs: Lactic Acid, Venous    Component Value Date/Time   LATICACIDVEN 0.6 12/25/2018 0803    MICROBIOLOGY: Recent Results (from the past 240 hour(s))  Urine culture     Status: Abnormal   Collection Time: 05/30/19  2:59 PM   Specimen: Urine, Catheterized  Result Value Ref Range Status   Specimen Description URINE, CATHETERIZED  Final   Special Requests   Final    NONE Performed at [facility], [place] [address], [place], [place] [address]    Culture >=100,000 COLONIES/mL ENTEROBACTER AEROGENES (A)  Final   Report Status 06/01/2019 FINAL  Final   Organism ID, Bacteria ENTEROBACTER AEROGENES (A)  Final      Susceptibility   Enterobacter aerogenes - MIC*    CEFAZOLIN >=64 RESISTANT Resistant     CEFTRIAXONE <=1 SENSITIVE Sensitive     CIPROFLOXACIN <=0.25 SENSITIVE Sensitive     GENTAMICIN <=1 SENSITIVE Sensitive     IMIPENEM 1 SENSITIVE Sensitive     NITROFURANTOIN 64 INTERMEDIATE Intermediate     TRIMETH/SULFA <=20 SENSITIVE Sensitive     PIP/TAZO <=4 SENSITIVE Sensitive     * >=100,000 COLONIES/mL ENTEROBACTER AEROGENES  Respiratory Panel by RT PCR (Flu A&B, Covid) - Nasopharyngeal Swab     Status: None   Collection Time: 05/30/19  8:48 PM   Specimen: Nasopharyngeal Swab  Result Value Ref Range Status   SARS Coronavirus 2 by RT PCR NEGATIVE NEGATIVE Final    Comment: (NOTE) SARS-CoV-2 target nucleic acids are NOT DETECTED. The SARS-CoV-2 RNA is generally detectable in upper  respiratoy specimens during the acute phase of infection. The lowest concentration of SARS-CoV-2 viral copies this assay can detect is 131 copies/mL. A negative result does not preclude SARS-Cov-2 infection and should not be used as the sole basis for treatment or other patient management decisions. A negative result may occur with  improper specimen collection/handling, submission of specimen other than nasopharyngeal swab, presence of viral mutation(s) within the areas targeted by this assay, and inadequate number of viral copies (<131 copies/mL). A negative result must be combined with clinical observations, patient history, and epidemiological information. The expected result is Negative. Fact Sheet for Patients:  PinkCheek.be Fact Sheet for Healthcare Providers:  GravelBags.it This test is not yet ap proved or cleared by the [place] FDA and  has been authorized for detection and/or diagnosis of SARS-CoV-2 by FDA under an Emergency Use Authorization (EUA). This EUA will remain  in effect (meaning this test can be used) for the duration of the COVID-19 declaration under Section 564(b)(1) of the Act, 21 U.S.C. section 360bbb-3(b)(1), unless the authorization is terminated or revoked sooner.    Influenza A by PCR NEGATIVE NEGATIVE Final   Influenza B by PCR NEGATIVE NEGATIVE Final    Comment: (NOTE) The Xpert Xpress SARS-CoV-2/FLU/RSV assay is intended as an aid in  the diagnosis of influenza from Nasopharyngeal swab specimens and  should not be used as a sole basis for treatment. Nasal washings and  aspirates are unacceptable for Xpert Xpress SARS-CoV-2/FLU/RSV  testing. Fact Sheet for Patients: PinkCheek.be Fact Sheet for Healthcare Providers: GravelBags.it This test is not yet approved or cleared by the [place] FDA and  has been authorized for  detection and/or diagnosis of SARS-CoV-2 by  FDA under an Emergency Use Authorization (EUA). This EUA will remain  in effect (meaning this test can be used) for the duration of the  Covid-19 declaration under Section 564(b)(1) of the Act, 21  U.S.C. section 360bbb-3(b)(1), unless the authorization is  terminated or revoked. Performed at [facility], [place] [address] [place], [place] [address]   MRSA PCR Screening     Status: None   Collection Time: 05/31/19  9:42 AM   Specimen: Nasopharyngeal  Result Value Ref Range Status   MRSA by PCR NEGATIVE NEGATIVE Final    Comment:        The GeneXpert MRSA Assay (FDA approved for NASAL specimens only), is one component of a comprehensive MRSA colonization surveillance program. It is not intended to diagnose MRSA infection nor to guide or monitor treatment for MRSA infections. Performed at [facility], [place] [address], [place], [place] [address]     RADIOLOGY STUDIES/RESULTS: No results found.   LOS: 6 days   Signature  Lala Lund M.D on 06/06/2019 at 1:45 PM   -  To page go to [email or web address]

## 2019-06-06 NOTE — Progress Notes (Signed)
SLP Cancellation Note  Patient Details Name: CARINA CHAPLIN MRN: [id number] DOB: 06/06/1945   Cancelled treatment:       Reason Eval/Treat Not Completed: Patient at procedure or test/unavailable(Out of room. )  Gabriel Rainwater MA, CCC-SLP   Azarias Chiou Meryl 06/06/2019, 9:57 AM

## 2019-06-06 NOTE — Plan of Care (Signed)
  Problem: Clinical Measurements: Goal: Ability to maintain clinical measurements within normal limits will improve Outcome: Progressing   Problem: Clinical Measurements: Goal: Respiratory complications will improve 06/06/2019 0220 by Irish Lack, RN Outcome: Progressing 06/06/2019 0209 by Irish Lack, RN Outcome: Progressing   Problem: Clinical Measurements: Goal: Cardiovascular complication will be avoided 06/06/2019 0220 by Irish Lack, RN Outcome: Progressing 06/06/2019 0209 by Irish Lack, RN Outcome: Progressing   Problem: Clinical Measurements: Goal: Ability to maintain clinical measurements within normal limits will improve Outcome: Progressing   Problem: Clinical Measurements: Goal: Respiratory complications will improve 06/06/2019 0220 by Irish Lack, RN Outcome: Progressing 06/06/2019 0209 by Irish Lack, RN Outcome: Progressing   Problem: Clinical Measurements: Goal: Cardiovascular complication will be avoided 06/06/2019 0220 by Irish Lack, RN Outcome: Progressing 06/06/2019 0209 by Irish Lack, RN Outcome: Progressing

## 2019-06-07 DIAGNOSIS — E46 Unspecified protein-calorie malnutrition: Secondary | ICD-10-CM | POA: Insufficient documentation

## 2019-06-07 LAB — CBC
HCT: 34.7 % — ABNORMAL LOW (ref 36.0–46.0)
Hemoglobin: 10.4 g/dL — ABNORMAL LOW (ref 12.0–15.0)
MCH: 28.5 pg (ref 26.0–34.0)
MCHC: 30 g/dL (ref 30.0–36.0)
MCV: 95.1 fL (ref 80.0–100.0)
Platelets: 158 10*3/uL (ref 150–400)
RBC: 3.65 MIL/uL — ABNORMAL LOW (ref 3.87–5.11)
RDW: 17.6 % — ABNORMAL HIGH (ref 11.5–15.5)
WBC: 12.4 10*3/uL — ABNORMAL HIGH (ref 4.0–10.5)
nRBC: 0.2 % (ref 0.0–0.2)

## 2019-06-07 LAB — RENAL FUNCTION PANEL
Albumin: 2.2 g/dL — ABNORMAL LOW (ref 3.5–5.0)
Anion gap: 10 (ref 5–15)
BUN: 8 mg/dL (ref 8–23)
CO2: 26 mmol/L (ref 22–32)
Calcium: 8.8 mg/dL — ABNORMAL LOW (ref 8.9–10.3)
Chloride: 101 mmol/L (ref 98–111)
Creatinine, Ser: 2.56 mg/dL — ABNORMAL HIGH (ref 0.44–1.00)
GFR calc Af Amer: 20 mL/min — ABNORMAL LOW (ref >60)
GFR calc non Af Amer: 18 mL/min — ABNORMAL LOW (ref >60)
Glucose, Bld: 106 mg/dL — ABNORMAL HIGH (ref 70–99)
Phosphorus: 2.3 mg/dL — ABNORMAL LOW (ref 2.5–4.6)
Potassium: 3.6 mmol/L (ref 3.5–5.1)
Sodium: 137 mmol/L (ref 135–145)

## 2019-06-07 LAB — GLUCOSE, CAPILLARY
Glucose-Capillary: 95 mg/dL (ref 70–99)
Glucose-Capillary: 70 mg/dL (ref 70–99)
Glucose-Capillary: 72 mg/dL (ref 70–99)
Glucose-Capillary: 73 mg/dL (ref 70–99)
Glucose-Capillary: 126 mg/dL — ABNORMAL HIGH (ref 70–99)
Glucose-Capillary: 124 mg/dL — ABNORMAL HIGH (ref 70–99)

## 2019-06-07 MED ORDER — GLUCOSE 40 % PO GEL
ORAL | Status: AC
  Filled 2019-06-07: qty 1

## 2019-06-07 MED ORDER — GLUCOSE 4 G PO CHEW
CHEWABLE_TABLET | ORAL | Status: AC
  Filled 2019-06-07: qty 1

## 2019-06-07 MED ORDER — MIDODRINE HCL 10 MG PO TABS: 10.0000 mg | ORAL_TABLET | Freq: Three times a day (TID) | ORAL | 0 refills | Status: AC

## 2019-06-07 MED ORDER — DEXTROSE 50 % IV SOLN: 25.0000 mL | Freq: Once | INTRAVENOUS | Status: DC

## 2019-06-07 NOTE — Discharge Instructions (Signed)
Follow with Primary MD Nche, Charlene Brooke, NP in 7 days   Get CBC, CMP, 2 view Chest X ray -  checked next visit within 1 week by Primary MD   Activity: As tolerated with Full fall precautions use walker/cane & assistance as needed  Disposition Home   Diet: Renal-low carbohydrate diet with 1.2 L fluid restriction per day.  Accuchecks 4 times/day, Once in AM empty stomach and then before each meal. Log in all results and show them to your Prim.MD in 3 days. If any glucose reading is under 80 or above 300 call your Prim MD immidiately. Follow Low glucose instructions for glucose under 80 as instructed.   Special Instructions: If you have smoked or chewed Tobacco  in the last 2 yrs please stop smoking, stop any regular Alcohol  and or any Recreational drug use.  On your next visit with your primary care physician please Get Medicines reviewed and adjusted.  Please request your Prim.MD to go over all Hospital Tests and Procedure/Radiological results at the follow up, please get all Hospital records sent to your Prim MD by signing hospital release before you go home.  If you experience worsening of your admission symptoms, develop shortness of breath, life threatening emergency, suicidal or homicidal thoughts you must seek medical attention immediately by calling 911 or calling your MD immediately  if symptoms less severe.  You Must read complete instructions/literature along with all the possible adverse reactions/side effects for all the Medicines you take and that have been prescribed to you. Take any new Medicines after you have completely understood and accpet all the possible adverse reactions/side effects.

## 2019-06-07 NOTE — TOC Transition Note (Signed)
Transition of Care [facility]) - CM/SW Discharge Note   Patient Details  Name: Joan Mosley MRN: [id number] Date of Birth: Feb 14, 1945  [facility] CM/SW Contact:  Maryclare Labrador, RN Phone Number: 06/07/2019, 11:15 AM   Clinical Narrative:  Pt deemed stable for discharge home today. Bedside nurse has found home hoyer lift pad and will send it home with pt. CM confirmed yet again with Kenna Gilbert and [name] that pt is to come back to the home setting today.  Also, both Danna and [name] informed CM that pt's son [name] confirmed with [facility] on yesterday evening that arrangements have been secured with [facility]. Family also confirmed if this is not the case tomorrow they can arrange wheelchair Lucianne Lei pick up for HD as a back up plan.   Per daughter, pt is transported by [facility]AR to PCP - denied barriers with transportation.  Daughter informed CM that she would arrange PCP post discharge follow up appt.  Daughter confirms working oxygen in the home.  Daughter and son [name] denied all concerns with pt returning to the home today.  Discharge order is signed.  PTAR forms printed to nurses station and bedside nurse to call PTAR once pt is ready and arrival time is confirmed with pts daughter Kenna Gilbert..  No other CM needs determined - CM signing off    Final next level of care: Home w Home Health Services Barriers to Discharge: Barriers Resolved   Patient Goals and CMS Choice     Choice offered to / list presented to : [facility] POA / Guardian  Discharge Placement                Patient to be transferred to facility by: [place] Name of family member notified: Kenna Gilbert and Mark Patient and family notified of of transfer: 06/07/19  Discharge Plan and Services In-house Referral: Clinical Social Work   Post Acute Care Choice: Home Health                    HH Arranged: RN, PT, OT, Nurse's Aide, Social Work, Refused SNF [place] Agency: [place] Date [facility] Agency Contacted: 06/06/19 Time HH Agency  Contacted: 1000 Representative spoke with at [place]: [place] ([place]) Interventions     Readmission Risk Interventions Readmission Risk Prevention Plan 05/31/2019 10/04/2018  Transportation Screening Complete Complete  Medication Review Press photographer) Complete Complete  PCP or Specialist appointment within 3-5 days of discharge Complete Complete  HRI or Home Care Consult Complete Complete  SW Recovery Care/Counseling Consult Complete Complete  Palliative Care Screening Not Applicable Complete  [place] Patient Refused Patient Refused

## 2019-06-07 NOTE — Care Management Important Message (Signed)
Important Message  Patient Details  Name: Joan Mosley MRN: [id number] Date of Birth: 03-31-1945   Medicare Important Message Given:  Yes - Important Message mailed due to current National Emergency  Verbal consent obtained due to current National Emergency  Relationship to patient: Self Contact Name: Beya Tipps Call Date: 06/07/19  Time: 0761 Phone: [phone] Outcome: No Answer/Busy Important Message mailed to: Patient address on file    Delorse Lek 06/07/2019, 9:19 AM

## 2019-06-07 NOTE — Progress Notes (Signed)
[facility] Progress Note   Subjective:   More awake this AM.  For d/c today.    Objective Vitals:   06/06/19 1700 06/06/19 1900 06/06/19 2043 06/07/19 0315  BP:  (!) 89/61 (!) 97/48 121/80  Pulse:    83  Resp:  (!) 26 18 20   Temp: 98.6 F (37 C)  99 F (37.2 C) 98.8 F (37.1 C)  TempSrc: Axillary  Oral Axillary  SpO2:   92% 96%  Weight:    78.9 kg   Physical Exam General: Chronically ill appearing female, alert and in NAD Heart: RRR, No murmurs, rubs or gallops Lungs: CTA bilaterally, no wheezing, rhonchi or rales, off O2 Abdomen: Soft, non-tender, non-distended, +BS Extremities: No edema b/l lower extremities Dialysis Access:  R IJ [facility]  Additional Objective Labs: Basic Metabolic Panel: Recent Labs  Lab 06/05/19 0931 06/06/19 1200 06/06/19 2357  NA 139 140 137  K 3.2* 3.0* 3.6  CL 104 101 101  CO2 24 24 26   GLUCOSE 99 118* 106*  BUN 14 21 8   CREATININE 4.01* 4.75* 2.56*  CALCIUM 9.4 9.5 8.8*  PHOS 4.0 3.9 2.3*   Liver Function Tests: Recent Labs  Lab 06/02/19 0721 06/02/19 0721 06/03/19 0533 06/03/19 0533 06/04/19 0523 06/04/19 0523 06/05/19 0931 06/06/19 1200 06/06/19 2357  AST 30  --  21  --  21  --   --   --   --   ALT 16  --  14  --  12  --   --   --   --   ALKPHOS 128*  --  113  --  111  --   --   --   --   BILITOT 1.0  --  0.7  --  0.4  --   --   --   --   PROT 5.2*  --  5.3*  --  5.1*  --   --   --   --   ALBUMIN 2.2*   < > 2.3*   < > 2.3*   < > 2.3* 2.2* 2.2*   < > = values in this interval not displayed.   No results for input(s): LIPASE, AMYLASE in the last 168 hours. CBC: Recent Labs  Lab 06/02/19 0721 06/02/19 0721 06/03/19 0533 06/03/19 0533 06/04/19 0523 06/06/19 1200 06/06/19 2357  WBC 14.3*   < > 9.9   < > 12.0* 12.1* 12.4*  HGB 10.7*   < > 9.9*   < > 10.0* 9.9* 10.4*  HCT 35.2*   < > 33.4*   < > 34.0* 33.9* 34.7*  MCV 94.1  --  96.0  --  95.8 97.4 95.1  PLT 175   < > 164   < > 194 193 158   < > =  values in this interval not displayed.   Blood Culture    Component Value Date/Time   SDES URINE, CATHETERIZED 05/30/2019 1459   SPECREQUEST  05/30/2019 1459    NONE Performed at [facility] [address] [place], [place] [address]    CULT >=100,000 COLONIES/mL ENTEROBACTER AEROGENES (A) 05/30/2019 1459   REPTSTATUS 06/01/2019 FINAL 05/30/2019 1459    CBG: Recent Labs  Lab 06/06/19 0810 06/06/19 0936 06/06/19 1723 06/06/19 2048 06/07/19 0733  GLUCAP 82 102* 114* 115* 95   Medications:  . atorvastatin  40 mg Oral q AM  . Chlorhexidine Gluconate Cloth  6 each Topical Q0600  . darbepoetin (ARANESP) injection - DIALYSIS  60 mcg Intravenous Q Tue-HD  . docusate sodium  100 mg Oral q morning - 10a  . FLUoxetine  10 mg Oral Daily  . insulin aspart  0-6 Units Subcutaneous TID WC  . levothyroxine  112 mcg Oral Q0600  . midodrine  10 mg Oral Daily  . montelukast  10 mg Oral QHS  . multivitamin  1 tablet Oral QHS  . pantoprazole  40 mg Oral QAC breakfast  . senna  1 tablet Oral q morning - 10a  . sevelamer carbonate  800 mg Oral TID WC    Dialysis Orders: [facility] 4h 400/800 83.5kg 2/2 bath P4 R IJ TDC Hep none - mircera 60 q2 last 3/20 - hect 4 ug  Assessment/Plan: 1. Acute metabolic encephalopathy: Suspected to be due to UTI and SDH. CT head 5/5 showed stability of SDH. Management per primary.   Stabilized, had CTX for UTI 2. ESRD: Will continue TTS schedule. Last K+ 3.0 on 5/9,  Given dose of PO KCl, will repeat RFP. Use added K+ bath with HD and replete K+ as needed.  3. HTN/volume:  BP controlled, no evidence of volume overload on exam. Continue midodrine with HD for BP support.  4. Anemia: Hgb 10.0. ESA ordered 5. Secondary hyperparathyroidism:  Corrected calcium high, hectorol on hold. Phos 5.5. Continue renvela.  6. Choledocholithiasis: Status post ERCP and biliary sphincterotomy and balloon extraction on 5/7.  Madelon Lips 06/07/2019, 11:13  AM  [facility] Pager: [phone]

## 2019-06-07 NOTE — Care Management Important Message (Signed)
Important Message  Patient Details  Name: Joan Mosley MRN: [id number] Date of Birth: April 04, 1945   Medicare Important Message Given:  Yes Patient left prior to IM delivery. Will mail to patient home.     Nygel Prokop 06/07/2019, 2:11 PM

## 2019-06-07 NOTE — Progress Notes (Signed)
Occupational Therapy Treatment Patient Details Name: Joan Mosley MRN: [id number] DOB: 25-Apr-1945 Today's Date: 06/07/2019    History of present illness 74 y.o. female with onset of acute encephalopathy and AMS was admitted, noted UTI, pleural effusion, had small SDH, has wounds on skin and cholechondrolithiasis with stones s/p ERCP 5/7.  PMHx:  B TKA's, CVA, DM, chronic resp failure on 5L O2, PN, HD from ESRD, L hip OA, diverticulitis, Covid, COPD, atherosclerosis, osteopenia, TIA,   OT comments  Patient supine in bed today and very fatigued, though eventually agreeable to sit up at EOB.  Required mod assist to go supine to sit.  Patient has heavy right lateral lean.  Worked on static and dynamic seated balance to prepare for ADLs.  Able to sit briefly unsupported before tiring and leaning.  Needed max assist x 2 to return to supine.  Patient able to help with scooting self up in bed.  Will continue to follow with OT to work on activity tolerance and strength to increase ADL independence.   Follow Up Recommendations  SNF;Supervision/Assistance - 24 hour    Equipment Recommendations  Other (comment)(defer to next venue)    Recommendations for Other Services      Precautions / Restrictions Precautions Precautions: Fall Precaution Comments: bowel incontinence Restrictions Weight Bearing Restrictions: No       Mobility Bed Mobility Overal bed mobility: Needs Assistance Bed Mobility: Rolling;Sit to Supine;Supine to Sit Rolling: Mod assist   Supine to sit: Mod assist;HOB elevated Sit to supine: Max assist;+2 for safety/equipment   General bed mobility comments: Able to pull rails and push through heels to help slide self up in bed  Transfers Overall transfer level: Needs assistance Equipment used: None             General transfer comment: unable this day due to fatigue    Balance Overall balance assessment: Needs assistance Sitting-balance support: Bilateral upper  extremity supported Sitting balance-Leahy Scale: Poor Sitting balance - Comments: Can sit unsupported briefly but then leans on R UE and leans heavily to R side. Postural control: Right lateral lean                                 ADL either performed or assessed with clinical judgement   ADL Overall ADL's : Needs assistance/impaired Eating/Feeding: Set up;Bed level                                   Functional mobility during ADLs: Maximal assistance General ADL Comments: Sat EOB 6 min working on static and dynamic seated balance     Vision       Perception     Praxis      Cognition Arousal/Alertness: Awake/alert Behavior During Therapy: Flat affect Overall Cognitive Status: No family/caregiver present to determine baseline cognitive functioning                                 General Comments: Patient flat during session and had slow processing.  Required multiple cues to follow commands        Exercises Exercises: Other exercises Other Exercises Other Exercises: Pursed lip breathing Other Exercises: EOB sit 6 min   Shoulder Instructions       General Comments Patient very fatigued during therapy  Pertinent Vitals/ Pain       Pain Assessment: No/denies pain  Home Living                                          Prior Functioning/Environment              Frequency  Min 2X/week        Progress Toward Goals  OT Goals(current goals can now be found in the care plan section)  Progress towards OT goals: Not progressing toward goals - comment(Very fatugued)  Acute Rehab OT Goals Patient Stated Goal: to go home OT Goal Formulation: With patient/family Time For Goal Achievement: 06/16/19 Potential to Achieve Goals: Goodell Discharge plan remains appropriate    Co-evaluation                 AM-PAC OT "6 Clicks" Daily Activity     Outcome Measure   Help from another person  eating meals?: A Little Help from another person taking care of personal grooming?: A Little Help from another person toileting, which includes using toliet, bedpan, or urinal?: Total Help from another person bathing (including washing, rinsing, drying)?: Total Help from another person to put on and taking off regular upper body clothing?: A Lot Help from another person to put on and taking off regular lower body clothing?: Total 6 Click Score: 11    End of Session Equipment Utilized During Treatment: Oxygen  OT Visit Diagnosis: Muscle weakness (generalized) (M62.81);Other symptoms and signs involving the nervous system (R29.898)   Activity Tolerance Patient limited by fatigue   Patient Left in bed;with call bell/phone within reach;with bed alarm set   Nurse Communication Mobility status        Time: 5397-6734 OT Time Calculation (min): 24 min  Charges: OT General Charges $OT Visit: 1 Visit OT Treatments $Therapeutic Activity: 23-37 mins  August Luz, OTR/L    Phylliss Bob 06/07/2019, 1:41 PM

## 2019-06-07 NOTE — Telephone Encounter (Signed)
Left message for patient to call back  

## 2019-06-07 NOTE — Progress Notes (Signed)
Pt was ready for discharge home. However, her blood sugar dropped to 70. Dr. Deno Etienne is notified and order received to give patient gel. Pt's blood sugar was 73 after 15 minutes and patient received another tube of gel. Pt was able to eat some vanilla pudding. Blood sugar was rechecked an hour later and it was 126. Pt's vital signs are stable. Spoke with patient's son in law and updated him on the situation. Pt's son in law was notified about doctor's orders to check her blood sugar on her arrival. Pt's son-in-law expresses the understanding of the situation. Ptar is called to take patient back home.

## 2019-06-07 NOTE — Discharge Summary (Signed)
Joan Mosley:[id number] DOB: 1945/03/16 DOA: 05/30/2019  PCP: Flossie Buffy, NP  Admit date: 05/30/2019  Discharge date: 06/07/2019  Admitted From: Home  Disposition:  Home   Recommendations for Outpatient Follow-up:   Follow up with PCP in 1-2 weeks  PCP Please obtain BMP/CBC, 2 view CXR in 1week,  (see Discharge instructions)   PCP Please follow up on the following pending results: Kindly review CT report in detail.  Check CBC and BMP in a week, needs outpatient [facility] follow-up in 1 to 2 weeks.     Home Health: PT,RN   Equipment/Devices: None  Consultations: GI, Renal Discharge Condition: Stable    CODE STATUS: Full    Diet Recommendation: Low carbohydrate diet with 1.2 L fluid restriction/ day    Chief Complaint  Patient presents with  . Altered Mental Status     Brief history of present illness from the day of admission and additional interim summary    Patient is a 74 y.o. 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    Significant events: 5/4>> admit to [facility] for confusion-found to have SDH, choledocholithiasis, UTI 4/8-4/12>> admit to [facility] for symptomatic anemia requiring PRBC transfusion  Antimicrobial therapy: Rocephin: 5/4>>5/8 Flagyl: 5/5>>5/8  Procedures/significant studies: 5/7: ERCP with sphincterotomy 5/6: CT left hip>> no osseous abnormality, circumferential rectal wall thickening, mild diffuse bladder wall thickening thickening, extensive sigmoid diverticulosis 5/4: CT abdomen/pelvis>>  choledocholithiasis, diverticular disease, multiple ovarian cysts  Consults: GI, nephrology                                                                  Hospital Course    Acute metabolic encephalopathy: Improved-suspect not far from baseline-no family at bedside.  Likely metabolic encephalopathy in the setting of UTI, SDH.  Mental status now back to baseline.  SDH: No focal deficits-repeat CT head on 5/5 shows stability.  ED MD on admission had spoken with neurosurgeon - Dr. Ricarda Frame advised no surgical intervention.  Choledocholithiasis: Did have some intermittent abdominal pain/nausea and vomiting on admission-currently without any abdominal pain.  Lipase levels normal-alk phos marginally elevated.  GI consulted-underwent ERCP with sphincterotomy and removal of CBD stones on 5/7.  No obvious signs of cholangitis, finished antibiotics continue monitoring closely.  Per GI no surgery needed for now due to poor overall health condition.  Will need to follow with [facility] in 1 to 2 weeks post discharge.  Rectal thickening seen incidentally on CT scan: GI does not advise further work-up-no clinical symptoms/signs consistent with proctitis.  Complicated UTI: Poor historian-does acknowledge intermittent  dysuria-UA grossly abnormal-has leukocytosis which is slowly downtrending-completed treatment with IV Rocephin.  Clinically UTI has resolved.  ESRD on HD TTS: Nephrology consulted for inpatient HD care.  Resume outpatient schedule post discharge.  COPD with chronic hypoxic respiratory failure on 3-5 L of oxygen/OSA: No wheezing-supportive care-continue bronchodilators.  Continue CPAP nightly  History of recent CVA: Nonfocal exam-antiplatelets contraindicated due to SDH.  Anemia: Likely secondary to ESRD-hemoglobin stable-continue to follow.  No indication for transfusion.  Hypothyroidism: Continue Synthroid  Hypotension: BP soft patient asymptomatic, switched midodrine  to daily instead of TTS schedule.  Pain around the left trochanter/hip area: CT left hip without any major abnormalities.  Anxiety/depression: At baseline-continue Prozac and Klonopin.  Suspected dementia: Family does acknowledge significant short-term memory issues-with significant decline over the past 6 months - 1 year.  Stable for outpatient follow-up with neurology/psychiatry.  Multiple bilateral ovarian cysts: Seen incidentally on CT scan-stable for outpatient work-up-outpatient pelvic ultrasound if felt warranted by PCP.  Chronic debility/deconditioning: Has been essentially bedbound for the past 2 years-Per patient's son-who I talked to couple of times they want him home with home health PT as they can provide her good care at home, this will be arranged.  DM-2: Continue home regimen and follow with PCP for glycemic control and A1c monitoring.  Lab Results  Component Value Date   HGBA1C 5.5 04/28/2019      Discharge diagnosis     Principal Problem:   Acute encephalopathy Active Problems:   Subdural hematoma (HCC)   ESRD on dialysis ([place])   Type II diabetes mellitus with renal manifestations (HCC)   COPD (chronic obstructive pulmonary disease) (HCC)   Urinary tract infection without hematuria   Pressure injury of skin    Discharge instructions    Discharge Instructions    Discharge instructions   Complete by: As directed    Follow with Primary MD Nche, Charlene Brooke, NP in 7 days   Get CBC, CMP, 2 view Chest X ray -  checked next visit within 1 week by Primary MD   Activity: As tolerated with Full fall precautions use walker/cane & assistance as needed  Disposition Home   Diet: Renal-low carbohydrate diet with 1.2 L fluid restriction per day.  Accuchecks 4 times/day, Once in AM empty stomach and then before each meal. Log in all results and show them to your Prim.MD in 3 days. If any glucose reading is under 80 or above 300 call your Prim MD  immidiately. Follow Low glucose instructions for glucose under 80 as instructed.   Special Instructions: If you have smoked or chewed Tobacco  in the last 2 yrs please stop smoking, stop any regular Alcohol  and or any Recreational drug use.  On your next visit with your primary care physician please Get Medicines reviewed and adjusted.  Please request your Prim.MD to go over all Hospital Tests and Procedure/Radiological results at the follow up, please get all Hospital records sent to your Prim MD by signing hospital release before you go home.  If you experience worsening of your admission symptoms, develop shortness of breath, life threatening emergency, suicidal or homicidal thoughts you must seek medical attention immediately by calling 911 or calling your MD immediately  if symptoms less severe.  You Must read complete instructions/literature along with all the possible adverse reactions/side effects for all the Medicines you take and that have been prescribed to you. Take any new Medicines after you have completely understood and accpet all the possible adverse reactions/side  effects.   Increase activity slowly   Complete by: As directed       Discharge Medications   Allergies as of 06/07/2019      Reactions   Avelox [moxifloxacin Hcl In Nacl] Other (See Comments)   Unknown reaction   Codeine Anaphylaxis   Penicillins Rash   Did it involve swelling of the face/tongue/throat, SOB, or low BP? Unk Did it involve sudden or severe rash/hives, skin peeling, or any reaction on the inside of your mouth or nose? Yes Did you need to seek medical attention at a hospital or doctor's office? Unk When did it last happen? Unk If all above answers are "NO", may proceed with cephalosporin use.   Sulfa Antibiotics Other (See Comments)   Unknown reaction   Dextromethorphan-guaifenesin Other (See Comments)   Reported by [facility] - unknown reaction. Pt states she is not allergic to  dextromethorphan-guaifenesin.   Shellfish Allergy Hives, Itching, Rash      Medication List    STOP taking these medications   nystatin 100000 UNIT/ML suspension Commonly known as: MYCOSTATIN     TAKE these medications   acetaminophen 325 MG tablet Commonly known as: TYLENOL Take 650 mg by mouth every 6 (six) hours as needed (for arthritic pain).   ascorbic acid 500 MG tablet Commonly known as: VITAMIN C Take 1 tablet (500 mg total) by mouth daily.   Asper-Flex 10 % cream Generic drug: trolamine salicylate Apply 1 application topically as needed for muscle pain (to painful sites).   atorvastatin 40 MG tablet Commonly known as: Lipitor Take 1 tablet (40 mg total) by mouth daily. What changed: when to take this   b complex-vitamin c-folic acid 0.8 MG Tabs tablet Take 1 tablet by mouth at bedtime.   benzonatate 200 MG capsule Commonly known as: TESSALON Take 1 capsule (200 mg total) by mouth 2 (two) times daily as needed for cough.   clonazePAM 0.5 MG tablet Commonly known as: KLONOPIN Take 0.25-0.5 mg by mouth See admin instructions. Take 0.25 mg by mouth in the morning and 0.5 mg in the evening   Darbepoetin Alfa 25 MCG/0.42ML Sosy injection Commonly known as: ARANESP Inject 0.42 mLs (25 mcg total) into the vein every Tuesday with hemodialysis.   docusate sodium 100 MG capsule Commonly known as: COLACE Take 100 mg by mouth every morning.   doxercalciferol 4 MCG/2ML injection Commonly known as: HECTOROL Inject 1.5 mLs (3 mcg total) into the vein Every Tuesday,Thursday,and Saturday with dialysis.   Thick-It Mixed Fruit/Berry Misc Take 1.5 L by mouth See admin instructions. May drink up to a total of 1.5 liters a day   feeding supplement (NEPRO CARB STEADY) Liqd Take 237 mLs by mouth 2 (two) times daily between meals.   FLUoxetine 10 MG tablet Commonly known as: PROZAC Take 1 tablet (10 mg total) by mouth daily.   furosemide 20 MG tablet Commonly known as:  LASIX Take 40 mg by mouth See admin instructions. Take 40 mg by mouth in the morning on non-dialysis days: Sun/Mon/Wed/Fri   guaiFENesin 600 MG 12 hr tablet Commonly known as: MUCINEX Take 1 tablet (600 mg total) by mouth 2 (two) times daily for 60 doses.   ipratropium-albuterol 0.5-2.5 (3) MG/3ML Soln Commonly known as: DUONEB Take 3 mLs by nebulization 4 (four) times daily.   K-Y LUBRICANT JELLY SENSITIVE EX Place 1 application into both nostrils as needed (as directed- for lubrication).   levalbuterol 45 MCG/ACT inhaler Commonly known as: XOPENEX HFA Inhale 2 puffs  into the lungs every 4 (four) hours as needed for wheezing.   Levemir FlexTouch 100 UNIT/ML FlexPen Generic drug: insulin detemir Inject 10 Units into the skin at bedtime.   Euthyrox 112 MCG tablet Generic drug: levothyroxine Take 112 mcg by mouth daily before breakfast.   levothyroxine 112 MCG tablet Commonly known as: SYNTHROID Take 1 tablet (112 mcg total) by mouth daily before breakfast. 1 AM   Lidocaine 4 % Ptch Commonly known as: Aspercreme Lidocaine Apply 1 patch topically daily as needed (Pain).   midodrine 10 MG tablet Commonly known as: PROAMATINE Take 1 tablet (10 mg total) by mouth 3 (three) times daily. What changed:   when to take this  additional instructions   montelukast 10 MG tablet Commonly known as: SINGULAIR Take 1 tablet (10 mg total) by mouth at bedtime.   NON FORMULARY Renal Diet Mechanical soft Nectar thick liquids   NON FORMULARY Inhale into the lungs at bedtime. "BiPAP IPAP 18, EPAP 5, flow rate 5": At bedtime and during the day as needed for shortness of breath when napping   nystatin cream Commonly known as: MYCOSTATIN Apply 1 application topically 2 (two) times daily as needed (as directed, under skin folds and/or under the breasts). What changed: Another medication with the same name was changed. Make sure you understand how and when to take each.   nystatin  powder Commonly known as: MYCOSTATIN/NYSTOP Apply 1 application topically 2 (two) times daily as needed (as directed- to any rashes). What changed: reasons to take this   OXYGEN Inhale 5-6 L/min into the lungs continuous.   oxymetazoline 0.05 % nasal spray Commonly known as: AFRIN Place 1 spray into both nostrils 2 (two) times daily as needed for congestion.   pantoprazole 40 MG tablet Commonly known as: PROTONIX Take 40 mg by mouth daily before breakfast.   rosuvastatin 40 MG tablet Commonly known as: CRESTOR Take 40 mg by mouth in the morning.   senna 8.6 MG Tabs tablet Commonly known as: SENOKOT Take 1 tablet by mouth every morning.   sevelamer carbonate 800 MG tablet Commonly known as: RENVELA Take 1 tablet (800 mg total) by mouth 3 (three) times daily with meals.   SIMPLY SALINE NA Place 2 sprays into both nostrils as needed (for congestion).            Durable Medical Equipment  (From admission, onward)         Start     Ordered   06/06/19 1033  For home use only DME Other see comment  Once    Comments: Harrel Lemon Lift Pad  Question:  Length of Need  Answer:  Lifetime   06/06/19 1033          Follow-up Information    Nche, Charlene Brooke, NP. Schedule an appointment as soon as possible for a visit in 1 week(s).   Specialty: Internal Medicine Contact information: [place] [address] [phone]        Thornton Park, MD. Schedule an appointment as soon as possible for a visit in 1 week(s).   Specialty: Gastroenterology Contact information: [place] [address] [phone]           Major procedures and Radiology Reports - PLEASE review detailed and final reports thoroughly  -      CT ABDOMEN PELVIS WO CONTRAST  Result Date: 05/30/2019 CLINICAL DATA:  Nausea and vomiting EXAM: CT ABDOMEN AND PELVIS WITHOUT CONTRAST TECHNIQUE: Multidetector CT imaging of the  abdomen and pelvis was performed  following the standard protocol without IV contrast. COMPARISON:  CT 12/24/2018, CT chest 09/11/2018 FINDINGS: Lower chest: Lung bases demonstrate small right pleural effusion. Chronic consolidation with calcification in the right lung base. Mild bronchiectasis. Coronary vascular calcifications. Incompletely visualized dialysis catheter tip in the SVC. Hepatobiliary: Multiple calcified gallstones. Intra and extrahepatic biliary dilatation. Numerous calcified stones in the common bile duct. Some of the intra hepatic ductal stones appear to have migrated into the common bile duct. Additional suspected vascular calcifications within the liver. Pancreas: Unremarkable. No pancreatic ductal dilatation or surrounding inflammatory changes. Spleen: Normal in size without focal abnormality. Adrenals/Urinary Tract: Adrenal glands are normal. Atrophic kidneys without hydronephrosis. Stable 1 cm hyperdense lesion lower pole left kidney. The bladder is slightly thick walled in appearance. Stomach/Bowel: Stomach nonenlarged. No dilated small bowel. Mild wall thickening of the rectum. 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 Vascular/Lymphatic: Extensive aortic atherosclerosis without aneurysm. No suspicious adenopathy Reproductive: Status post hysterectomy. Multiple cystic densities within the right greater than left ovary without significant change. Scattered parenchymal calcification. Other: Negative for free air or free fluid. Musculoskeletal: Degenerative changes throughout the spine. No acute or suspicious osseous abnormality. IMPRESSION: 1. Multiple calcified gallstones. Choledocholithiasis. Some of the previously noted intra hepatic biliary stones have migrated into the distal common bile duct. Degree of biliary dilatation does not appear significantly changed. 2. 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 Rectal wall thickening which could be secondary to inflammatory  process, though would correlate with direct inspection to exclude mass. 3. Atrophic kidneys without hydronephrosis. Stable 1 cm indeterminate hyperdense lesion lower pole left kidney. 4. Small right pleural effusion. Chronic consolidation with calcification in the right lung base. Mild bronchiectasis. 5. Aortic atherosclerosis. 6. Multiple cystic densities within the bilateral ovaries for which nonemergent pelvic ultrasound was previously suggest Aortic Atherosclerosis (ICD10-I70.0). Electronically Signed   By: Donavan Foil M.D.   On: 05/30/2019 19:21   DG Chest 2 View  Result Date: 05/30/2019 CLINICAL DATA:  Altered mental status. EXAM: CHEST - 2 VIEW COMPARISON:  05/04/2019. FINDINGS: Dual-lumen catheter noted with tip over SVC in stable position. Heart size stable. Stable atelectatic changes and/or infiltrates both lung bases. Bilateral interstitial prominence again noted. Stable small right pleural effusion. No pneumothorax. No acute bony abnormality identified. Diffuse osteopenia degenerative change thoracic spine. IMPRESSION: 1.  Dual-lumen catheter stable position. 2. Stable atelectatic changes and/or infiltrate both lung bases. Bilateral interstitial prominence again noted. Small stable small right pleural effusion. Chest is unchanged from prior exam. Electronically Signed   By: Marcello Moores  Register   On: 05/30/2019 14:25   CT HEAD WO CONTRAST  Result Date: 05/31/2019 CLINICAL DATA:  Lethargy. Altered mental status. EXAM: CT HEAD WITHOUT CONTRAST TECHNIQUE: Contiguous axial images were obtained from the base of the skull through the vertex without intravenous contrast. COMPARISON:  04/19/2019 FINDINGS: Brain: No evidence of acute infarction, hemorrhage, hydrocephalus, extra-axial collection or mass lesion/mass effect. There is moderate diffuse low-attenuation within the subcortical and periventricular white matter compatible with chronic microvascular disease. Vascular: No hyperdense vessel or unexpected  calcification. Skull: Normal. Negative for fracture or focal lesion. Sinuses/Orbits: The paranasal sinuses and mastoid air cells are clear. Partial opacification of the mastoid air cells noted. Other: None. IMPRESSION: 1. No acute intracranial abnormalities. 2. Chronic small vessel ischemic change and brain atrophy Electronically Signed   By: Kerby Moors M.D.   On: 05/31/2019 09:47   CT Head Wo Contrast  Result Date:  05/30/2019 CLINICAL DATA:  Encephalopathy lethargy EXAM: CT HEAD WITHOUT CONTRAST TECHNIQUE: Contiguous axial images were obtained from the base of the skull through the vertex without intravenous contrast. COMPARISON:  MRI 05/04/2019, CT brain 04/19/2019 FINDINGS: Brain: No acute territorial infarction or intracranial mass is visualized. Thin bilateral anterior convexity subdural hematomas measuring 3 mm, appear isodense without definitive hyperdensity but new since MRI 05/04/2019. Moderate atrophy. Extensive hypodensity in the white matter consistent with chronic small vessel ischemic change. Vascular: No hyperdense vessels.  Carotid vascular calcification Skull: Normal. Negative for fracture or focal lesion. Sinuses/Orbits: No acute finding. Other: None IMPRESSION: 1. New thin anterior convexity subdural hematomas measuring up to 3 mm maximum thickness without significant mass effect. These are probably subacute but are new since MRI 05/04/2019. 2. Atrophy and extensive chronic small vessel ischemic change of the white matter. Critical Value/emergent results were called by telephone at the time of interpretation on 05/30/2019 at 4:04 pm to provider [facility] , who verbally acknowledged these results. Electronically Signed   By: Donavan Foil M.D.   On: 05/30/2019 16:05   CT HIP LEFT WO CONTRAST  Result Date: 06/01/2019 CLINICAL DATA:  Chronic lateral left hip pain. No known recent injury EXAM: CT OF THE LEFT HIP WITHOUT CONTRAST TECHNIQUE: Multidetector CT imaging of the left hip was  performed according to the standard protocol. Multiplanar CT image reconstructions were also generated. COMPARISON:  CT 05/30/2019 FINDINGS: Bones/Joint/Cartilage No acute fracture. No dislocation. Moderate left hip osteoarthritis with joint space narrowing, subchondral sclerosis, and marginal osteophyte formation. No appreciable hip joint effusion. Greater and lesser trochanteric enthesopathic changes. No peritrochanteric bursal fluid collection. SI joints and pubic symphysis intact with mild arthropathy. Ligaments Suboptimally assessed by CT. Muscles and Tendons Diffuse muscle atrophy. No acute tendinous abnormality by CT. Soft tissues Mild anasarca. No focal soft tissue fluid collection or hematoma. No focal skin ulceration. No soft tissue gas. Circumferential rectal wall thickening. Extensive sigmoid diverticulosis. Mild diffuse urinary bladder wall thickening, although the bladder is incompletely distended. Atherosclerosis. IMPRESSION: 1. No acute osseous abnormality of the left hip. 2. Moderate left hip osteoarthritis. 3. Circumferential rectal wall thickening, nonspecific but can be seen with proctitis. 4. Mild diffuse urinary bladder wall thickening, although the bladder is incompletely distended. Correlate with urinalysis to exclude cystitis. 5. Extensive sigmoid diverticulosis. Electronically Signed   By: Davina Poke D.O.   On: 06/01/2019 10:06   DG ERCP BILIARY & PANCREATIC DUCTS  Result Date: 06/02/2019 CLINICAL DATA:  Choledocholithiasis. EXAM: ERCP TECHNIQUE: Multiple spot images obtained with the fluoroscopic device and submitted for interpretation post-procedure. COMPARISON:  CT of the abdomen on 05/30/2019 FINDINGS: Imaging with a C-arm demonstrates cannulation of the common bile duct with contrast injection demonstrating several well-circumscribed filling defects in the common bile duct. The duct appears mildly dilated. Balloon sweep maneuver was performed to extract calculi. IMPRESSION:  Choledocholithiasis with calculi identified in the common bile duct. Balloon sweep maneuver was performed to extract calculi. These images were submitted for radiologic interpretation only. Please see the procedural report for the amount of contrast and the fluoroscopy time utilized. Electronically Signed   By: Aletta Edouard M.D.   On: 06/02/2019 16:35    Micro Results    Recent Results (from the past 240 hour(s))  Urine culture     Status: Abnormal   Collection Time: 05/30/19  2:59 PM   Specimen: Urine, Catheterized  Result Value Ref Range Status   Specimen Description URINE, CATHETERIZED  Final   Special Requests  Final    NONE Performed at [facility], [place] [address], [place], [place] [address]    Culture >=100,000 COLONIES/mL ENTEROBACTER AEROGENES (A)  Final   Report Status 06/01/2019 FINAL  Final   Organism ID, Bacteria ENTEROBACTER AEROGENES (A)  Final      Susceptibility   Enterobacter aerogenes - MIC*    CEFAZOLIN >=64 RESISTANT Resistant     CEFTRIAXONE <=1 SENSITIVE Sensitive     CIPROFLOXACIN <=0.25 SENSITIVE Sensitive     GENTAMICIN <=1 SENSITIVE Sensitive     IMIPENEM 1 SENSITIVE Sensitive     NITROFURANTOIN 64 INTERMEDIATE Intermediate     TRIMETH/SULFA <=20 SENSITIVE Sensitive     PIP/TAZO <=4 SENSITIVE Sensitive     * >=100,000 COLONIES/mL ENTEROBACTER AEROGENES  Respiratory Panel by RT PCR (Flu A&B, Covid) - Nasopharyngeal Swab     Status: None   Collection Time: 05/30/19  8:48 PM   Specimen: Nasopharyngeal Swab  Result Value Ref Range Status   SARS Coronavirus 2 by RT PCR NEGATIVE NEGATIVE Final    Comment: (NOTE) SARS-CoV-2 target nucleic acids are NOT DETECTED. The SARS-CoV-2 RNA is generally detectable in upper respiratoy specimens during the acute phase of infection. The lowest concentration of SARS-CoV-2 viral copies this assay can detect is 131 copies/mL. A negative result does not preclude SARS-Cov-2 infection and should not be used as the  sole basis for treatment or other patient management decisions. A negative result may occur with  improper specimen collection/handling, submission of specimen other than nasopharyngeal swab, presence of viral mutation(s) within the areas targeted by this assay, and inadequate number of viral copies (<131 copies/mL). A negative result must be combined with clinical observations, patient history, and epidemiological information. The expected result is Negative. Fact Sheet for Patients:  PinkCheek.be Fact Sheet for Healthcare Providers:  GravelBags.it This test is not yet ap proved or cleared by the [place] FDA and  has been authorized for detection and/or diagnosis of SARS-CoV-2 by FDA under an Emergency Use Authorization (EUA). This EUA will remain  in effect (meaning this test can be used) for the duration of the COVID-19 declaration under Section 564(b)(1) of the Act, 21 U.S.C. section 360bbb-3(b)(1), unless the authorization is terminated or revoked sooner.    Influenza A by PCR NEGATIVE NEGATIVE Final   Influenza B by PCR NEGATIVE NEGATIVE Final    Comment: (NOTE) The Xpert Xpress SARS-CoV-2/FLU/RSV assay is intended as an aid in  the diagnosis of influenza from Nasopharyngeal swab specimens and  should not be used as a sole basis for treatment. Nasal washings and  aspirates are unacceptable for Xpert Xpress SARS-CoV-2/FLU/RSV  testing. Fact Sheet for Patients: PinkCheek.be Fact Sheet for Healthcare Providers: GravelBags.it This test is not yet approved or cleared by the [place] FDA and  has been authorized for detection and/or diagnosis of SARS-CoV-2 by  FDA under an Emergency Use Authorization (EUA). This EUA will remain  in effect (meaning this test can be used) for the duration of the  Covid-19 declaration under Section 564(b)(1) of the Act, 21    U.S.C. section 360bbb-3(b)(1), unless the authorization is  terminated or revoked. Performed at [facility], [place] [address], [place], [place] [address]   MRSA PCR Screening     Status: None   Collection Time: 05/31/19  9:42 AM   Specimen: Nasopharyngeal  Result Value Ref Range Status   MRSA by PCR NEGATIVE NEGATIVE Final    Comment:  The GeneXpert MRSA Assay (FDA approved for NASAL specimens only), is one component of a comprehensive MRSA colonization surveillance program. It is not intended to diagnose MRSA infection nor to guide or monitor treatment for MRSA infections. Performed at [facility], [place] [address] [place], [place] [address]     Today   Subjective    Jakaila Norment today has no headache,no chest abdominal pain,no new weakness tingling or numbness, feels much better wants to go home today.    Objective   Blood pressure 121/80, pulse 83, temperature 98.8 F (37.1 C), temperature source Axillary, resp. rate 20, weight 78.9 kg, SpO2 96 %.   Intake/Output Summary (Last 24 hours) at 06/07/2019 0859 Last data filed at 06/06/2019 1500 Gross per 24 hour  Intake 120 ml  Output 1250 ml  Net -1130 ml    Exam  Awake Alert,  No new F.N deficits, Normal affect [place].AT,PERRAL Supple Neck,No JVD, No cervical lymphadenopathy appriciated.  Symmetrical Chest wall movement, Good air movement bilaterally, CTAB RRR,No Gallops,Rubs or new Murmurs, No Parasternal Heave +ve B.Sounds, Abd Soft, Non tender, No organomegaly appriciated, No rebound -guarding or rigidity. No Cyanosis, Clubbing or edema, No new Rash or bruise   Data Review   CBC w Diff:  Lab Results  Component Value Date   WBC 12.4 (H) 06/06/2019   HGB 10.4 (L) 06/06/2019   HCT 34.7 (L) 06/06/2019   PLT 158 06/06/2019   LYMPHOPCT 13 05/30/2019   MONOPCT 7 05/30/2019   EOSPCT 0 05/30/2019   BASOPCT 0 05/30/2019    CMP:  Lab Results  Component Value Date   NA 137 06/06/2019    NA 139 05/12/2019   K 3.6 06/06/2019   CL 101 06/06/2019   CO2 26 06/06/2019   BUN 8 06/06/2019   BUN 23 (A) 05/12/2019   CREATININE 2.56 (H) 06/06/2019   CREATININE 7.20 (H) 07/01/2018   GLU 122 05/12/2019   PROT 5.1 (L) 06/04/2019   ALBUMIN 2.2 (L) 06/06/2019   BILITOT 0.4 06/04/2019   ALKPHOS 111 06/04/2019   AST 21 06/04/2019   ALT 12 06/04/2019  .   Total Time in preparing paper work, data evaluation and todays exam - 48 minutes  Lala Lund M.D on 06/07/2019 at [facility]  [phone]

## 2019-06-08 NOTE — Telephone Encounter (Signed)
Called and spoke to pt's son. She has dialysis on Tuesday and Thursday so she needs a Mon, Wed or Friday appt. Scheduled pt for an appt on June 2nd with Dr. Havery Moros. Patient has address for appt.

## 2019-06-09 ENCOUNTER — Telehealth: Payer: Self-pay

## 2019-06-09 ENCOUNTER — Telehealth: Payer: Self-pay | Admitting: Nurse Practitioner

## 2019-06-09 NOTE — Telephone Encounter (Signed)
Pt's son was contacted and he understood to call and consult with the nephrologist about the Midodrine dose.

## 2019-06-09 NOTE — Telephone Encounter (Signed)
Pt son was notified and told to call nephrologist for dosage.

## 2019-06-09 NOTE — Telephone Encounter (Signed)
Please advise him to consult with nephrologist about this dose. Thank you

## 2019-06-09 NOTE — Telephone Encounter (Signed)
Please advise son to discuss midodrine with nephrologist.

## 2019-06-09 NOTE — Telephone Encounter (Signed)
Erich Montane, physical therapist with [facility], called to say he did resumption of care today and will be seeing the patient once a week x 1 week, twice a week x 4 weeks then once a week x 1 week. The patient declined occupational therapy. Midodrine was changed to one tablet three times per day by the hospital and the son may call for clarification on dosage because he feels it may be too much. If you have any questions, you can reach Erich Montane [phone].

## 2019-06-09 NOTE — Telephone Encounter (Signed)
[name] please advise.  Pt's son called the office stating that his mother was recently discharged from the hospital and he noticed that her Midodrine has been changed. He stated she was just taking 1 a day an hour prior to dialysis an when they discharged her they have her taking it 3 times a day. He was worried that was to much and wanted to make sure that was ok with you an if so he said they will need a new script so they don't run out.

## 2019-06-14 ENCOUNTER — Telehealth: Payer: Self-pay | Admitting: Nurse Practitioner

## 2019-06-14 NOTE — Telephone Encounter (Signed)
Joan Mosley please advise 

## 2019-06-14 NOTE — Telephone Encounter (Signed)
Joan Mosley with [facility] called requesting skilled nursing orders, once a week x 6 weeks and an MSW eval. The patient has two open wounds after her hospitalization that appear to be skin tears. Needs wound care orders for those as well. Joan Mosley's call back number is [phone].

## 2019-06-15 NOTE — Telephone Encounter (Signed)
[name] was notified and given the OK for skilled nursing orders and wound care orders.

## 2019-06-15 NOTE — Telephone Encounter (Signed)
ok 

## 2019-06-21 ENCOUNTER — Telehealth: Payer: Self-pay | Admitting: Nurse Practitioner

## 2019-06-21 ENCOUNTER — Telehealth: Payer: Self-pay

## 2019-06-21 NOTE — Telephone Encounter (Signed)
Pt scheduled My Chart video 06/30/19 @ 1:30pm.

## 2019-06-21 NOTE — Telephone Encounter (Signed)
error 

## 2019-06-21 NOTE — Telephone Encounter (Signed)
Need to schedule a sooner appt. F2F preferred, but if not possible at least schedule a video appt.

## 2019-06-21 NOTE — Telephone Encounter (Signed)
[name] please advise.  Angel called from [facility] with a report for pt. Joan Mosley said her Heart rate 06/21/19 was 103, she stated anything over 100 was to be reported but all other vitals was normal and she was asymptomatic. Joan Mosley also wanted to inform you of the 2 wounds that was observed after she was at home. One on her left upper thigh and one on the left lower back. Joan Mosley feels like the wound on her left lower back is infected and she may need an antibiotic for. Joan Mosley stated it was red and draining green and yellow. She also said it appears a lot deeper than before.  Pt scheduled for follow up 07/11/19.

## 2019-06-27 ENCOUNTER — Inpatient Hospital Stay (HOSPITAL_COMMUNITY)
Admission: EM | Admit: 2019-06-27 | Discharge: 2019-06-30 | DRG: 070 | Disposition: A | Discharge status: Home Health | Payer: Medicare Other | Attending: Internal Medicine | Admitting: Internal Medicine

## 2019-06-27 ENCOUNTER — Other Ambulatory Visit: Payer: Self-pay

## 2019-06-27 ENCOUNTER — Emergency Department (HOSPITAL_COMMUNITY): Payer: Medicare Other

## 2019-06-27 ENCOUNTER — Encounter (HOSPITAL_COMMUNITY): Payer: Self-pay

## 2019-06-27 DIAGNOSIS — J9611 Chronic respiratory failure with hypoxia: Secondary | ICD-10-CM | POA: Diagnosis present

## 2019-06-27 DIAGNOSIS — Z888 Allergy status to other drugs, medicaments and biological substances status: Secondary | ICD-10-CM

## 2019-06-27 DIAGNOSIS — N186 End stage renal disease: Secondary | ICD-10-CM | POA: Diagnosis present

## 2019-06-27 DIAGNOSIS — N185 Chronic kidney disease, stage 5: Secondary | ICD-10-CM | POA: Diagnosis not present

## 2019-06-27 DIAGNOSIS — E1142 Type 2 diabetes mellitus with diabetic polyneuropathy: Secondary | ICD-10-CM | POA: Diagnosis present

## 2019-06-27 DIAGNOSIS — E8889 Other specified metabolic disorders: Secondary | ICD-10-CM | POA: Diagnosis present

## 2019-06-27 DIAGNOSIS — N2581 Secondary hyperparathyroidism of renal origin: Secondary | ICD-10-CM | POA: Diagnosis present

## 2019-06-27 DIAGNOSIS — I953 Hypotension of hemodialysis: Secondary | ICD-10-CM | POA: Diagnosis not present

## 2019-06-27 DIAGNOSIS — R5381 Other malaise: Secondary | ICD-10-CM | POA: Diagnosis present

## 2019-06-27 DIAGNOSIS — J9621 Acute and chronic respiratory failure with hypoxia: Secondary | ICD-10-CM | POA: Diagnosis not present

## 2019-06-27 DIAGNOSIS — Z87891 Personal history of nicotine dependence: Secondary | ICD-10-CM

## 2019-06-27 DIAGNOSIS — B372 Candidiasis of skin and nail: Secondary | ICD-10-CM | POA: Diagnosis present

## 2019-06-27 DIAGNOSIS — D631 Anemia in chronic kidney disease: Secondary | ICD-10-CM | POA: Diagnosis present

## 2019-06-27 DIAGNOSIS — H9190 Unspecified hearing loss, unspecified ear: Secondary | ICD-10-CM | POA: Diagnosis present

## 2019-06-27 DIAGNOSIS — E890 Postprocedural hypothyroidism: Secondary | ICD-10-CM | POA: Diagnosis present

## 2019-06-27 DIAGNOSIS — R319 Hematuria, unspecified: Secondary | ICD-10-CM | POA: Diagnosis present

## 2019-06-27 DIAGNOSIS — J9610 Chronic respiratory failure, unspecified whether with hypoxia or hypercapnia: Secondary | ICD-10-CM | POA: Diagnosis present

## 2019-06-27 DIAGNOSIS — Z8616 Personal history of COVID-19: Secondary | ICD-10-CM

## 2019-06-27 DIAGNOSIS — G4733 Obstructive sleep apnea (adult) (pediatric): Secondary | ICD-10-CM | POA: Diagnosis present

## 2019-06-27 DIAGNOSIS — J449 Chronic obstructive pulmonary disease, unspecified: Secondary | ICD-10-CM | POA: Diagnosis present

## 2019-06-27 DIAGNOSIS — L8990 Pressure ulcer of unspecified site, unspecified stage: Secondary | ICD-10-CM | POA: Diagnosis present

## 2019-06-27 DIAGNOSIS — E039 Hypothyroidism, unspecified: Secondary | ICD-10-CM | POA: Diagnosis present

## 2019-06-27 DIAGNOSIS — N390 Urinary tract infection, site not specified: Secondary | ICD-10-CM | POA: Diagnosis present

## 2019-06-27 DIAGNOSIS — G9340 Encephalopathy, unspecified: Secondary | ICD-10-CM | POA: Diagnosis not present

## 2019-06-27 DIAGNOSIS — E162 Hypoglycemia, unspecified: Secondary | ICD-10-CM

## 2019-06-27 DIAGNOSIS — E871 Hypo-osmolality and hyponatremia: Secondary | ICD-10-CM | POA: Diagnosis not present

## 2019-06-27 DIAGNOSIS — I120 Hypertensive chronic kidney disease with stage 5 chronic kidney disease or end stage renal disease: Secondary | ICD-10-CM | POA: Diagnosis present

## 2019-06-27 DIAGNOSIS — Z993 Dependence on wheelchair: Secondary | ICD-10-CM

## 2019-06-27 DIAGNOSIS — Z20822 Contact with and (suspected) exposure to covid-19: Secondary | ICD-10-CM | POA: Diagnosis present

## 2019-06-27 DIAGNOSIS — L89220 Pressure ulcer of left hip, unstageable: Secondary | ICD-10-CM | POA: Diagnosis present

## 2019-06-27 DIAGNOSIS — Z794 Long term (current) use of insulin: Secondary | ICD-10-CM

## 2019-06-27 DIAGNOSIS — Z6833 Body mass index (BMI) 33.0-33.9, adult: Secondary | ICD-10-CM

## 2019-06-27 DIAGNOSIS — Z79899 Other long term (current) drug therapy: Secondary | ICD-10-CM

## 2019-06-27 DIAGNOSIS — R197 Diarrhea, unspecified: Secondary | ICD-10-CM | POA: Diagnosis present

## 2019-06-27 DIAGNOSIS — J410 Simple chronic bronchitis: Secondary | ICD-10-CM | POA: Diagnosis not present

## 2019-06-27 DIAGNOSIS — Z8673 Personal history of transient ischemic attack (TIA), and cerebral infarction without residual deficits: Secondary | ICD-10-CM

## 2019-06-27 DIAGNOSIS — Z992 Dependence on renal dialysis: Secondary | ICD-10-CM | POA: Diagnosis not present

## 2019-06-27 DIAGNOSIS — Z87440 Personal history of urinary (tract) infections: Secondary | ICD-10-CM

## 2019-06-27 DIAGNOSIS — R0602 Shortness of breath: Secondary | ICD-10-CM

## 2019-06-27 DIAGNOSIS — G9341 Metabolic encephalopathy: Principal | ICD-10-CM | POA: Diagnosis present

## 2019-06-27 DIAGNOSIS — Z882 Allergy status to sulfonamides status: Secondary | ICD-10-CM

## 2019-06-27 DIAGNOSIS — E1122 Type 2 diabetes mellitus with diabetic chronic kidney disease: Secondary | ICD-10-CM | POA: Diagnosis present

## 2019-06-27 DIAGNOSIS — F418 Other specified anxiety disorders: Secondary | ICD-10-CM | POA: Diagnosis present

## 2019-06-27 DIAGNOSIS — Z96653 Presence of artificial knee joint, bilateral: Secondary | ICD-10-CM | POA: Diagnosis present

## 2019-06-27 DIAGNOSIS — Z91013 Allergy to seafood: Secondary | ICD-10-CM

## 2019-06-27 DIAGNOSIS — E1129 Type 2 diabetes mellitus with other diabetic kidney complication: Secondary | ICD-10-CM | POA: Diagnosis present

## 2019-06-27 DIAGNOSIS — Z9115 Patient's noncompliance with renal dialysis: Secondary | ICD-10-CM

## 2019-06-27 DIAGNOSIS — E785 Hyperlipidemia, unspecified: Secondary | ICD-10-CM | POA: Diagnosis present

## 2019-06-27 DIAGNOSIS — Z90710 Acquired absence of both cervix and uterus: Secondary | ICD-10-CM

## 2019-06-27 DIAGNOSIS — F0390 Unspecified dementia without behavioral disturbance: Secondary | ICD-10-CM | POA: Diagnosis present

## 2019-06-27 DIAGNOSIS — Z9981 Dependence on supplemental oxygen: Secondary | ICD-10-CM

## 2019-06-27 DIAGNOSIS — Z885 Allergy status to narcotic agent status: Secondary | ICD-10-CM

## 2019-06-27 DIAGNOSIS — E11649 Type 2 diabetes mellitus with hypoglycemia without coma: Secondary | ICD-10-CM | POA: Diagnosis present

## 2019-06-27 DIAGNOSIS — Z7401 Bed confinement status: Secondary | ICD-10-CM

## 2019-06-27 DIAGNOSIS — Z880 Allergy status to penicillin: Secondary | ICD-10-CM

## 2019-06-27 DIAGNOSIS — Z8249 Family history of ischemic heart disease and other diseases of the circulatory system: Secondary | ICD-10-CM

## 2019-06-27 DIAGNOSIS — Z881 Allergy status to other antibiotic agents status: Secondary | ICD-10-CM

## 2019-06-27 DIAGNOSIS — B9620 Unspecified Escherichia coli [E. coli] as the cause of diseases classified elsewhere: Secondary | ICD-10-CM | POA: Diagnosis present

## 2019-06-27 DIAGNOSIS — Z79890 Hormone replacement therapy (postmenopausal): Secondary | ICD-10-CM

## 2019-06-27 LAB — PROTIME-INR
INR: 1 (ref 0.8–1.2)
Prothrombin Time: 13.2 seconds (ref 11.4–15.2)

## 2019-06-27 LAB — CBC WITH DIFFERENTIAL/PLATELET
Abs Immature Granulocytes: 0.05 10*3/uL (ref 0.00–0.07)
Basophils Absolute: 0 10*3/uL (ref 0.0–0.1)
Basophils Relative: 0 %
Eosinophils Absolute: 0 10*3/uL (ref 0.0–0.5)
Eosinophils Relative: 0 %
HCT: 36.4 % (ref 36.0–46.0)
Hemoglobin: 10.9 g/dL — ABNORMAL LOW (ref 12.0–15.0)
Immature Granulocytes: 0 %
Lymphocytes Relative: 7 %
Lymphs Abs: 1 10*3/uL (ref 0.7–4.0)
MCH: 28.2 pg (ref 26.0–34.0)
MCHC: 29.9 g/dL — ABNORMAL LOW (ref 30.0–36.0)
MCV: 94.1 fL (ref 80.0–100.0)
Monocytes Absolute: 1.3 10*3/uL — ABNORMAL HIGH (ref 0.1–1.0)
Monocytes Relative: 10 %
Neutro Abs: 10.9 10*3/uL — ABNORMAL HIGH (ref 1.7–7.7)
Neutrophils Relative %: 83 %
Platelets: 158 10*3/uL (ref 150–400)
RBC: 3.87 MIL/uL (ref 3.87–5.11)
RDW: 18.9 % — ABNORMAL HIGH (ref 11.5–15.5)
WBC: 13.2 10*3/uL — ABNORMAL HIGH (ref 4.0–10.5)
nRBC: 0 % (ref 0.0–0.2)

## 2019-06-27 LAB — COMPREHENSIVE METABOLIC PANEL
ALT: 14 U/L (ref 0–44)
AST: 33 U/L (ref 15–41)
Albumin: 2.1 g/dL — ABNORMAL LOW (ref 3.5–5.0)
Alkaline Phosphatase: 145 U/L — ABNORMAL HIGH (ref 38–126)
Anion gap: 19 — ABNORMAL HIGH (ref 5–15)
BUN: 39 mg/dL — ABNORMAL HIGH (ref 8–23)
CO2: 22 mmol/L (ref 22–32)
Calcium: 8.9 mg/dL (ref 8.9–10.3)
Chloride: 90 mmol/L — ABNORMAL LOW (ref 98–111)
Creatinine, Ser: 5.74 mg/dL — ABNORMAL HIGH (ref 0.44–1.00)
GFR calc Af Amer: 8 mL/min — ABNORMAL LOW (ref >60)
GFR calc non Af Amer: 7 mL/min — ABNORMAL LOW (ref >60)
Glucose, Bld: 149 mg/dL — ABNORMAL HIGH (ref 70–99)
Potassium: 4 mmol/L (ref 3.5–5.1)
Sodium: 131 mmol/L — ABNORMAL LOW (ref 135–145)
Total Bilirubin: 0.7 mg/dL (ref 0.3–1.2)
Total Protein: 5.9 g/dL — ABNORMAL LOW (ref 6.5–8.1)

## 2019-06-27 LAB — URINALYSIS, ROUTINE W REFLEX MICROSCOPIC
Bilirubin Urine: NEGATIVE
Glucose, UA: NEGATIVE mg/dL
Ketones, ur: NEGATIVE mg/dL
Nitrite: NEGATIVE
Protein, ur: 100 mg/dL — AB
Specific Gravity, Urine: 1.014 (ref 1.005–1.030)
Squamous Epithelial / HPF: >50 — ABNORMAL HIGH (ref 0–5)
WBC, UA: >50 WBC/hpf — ABNORMAL HIGH (ref 0–5)
pH: 5 (ref 5.0–8.0)

## 2019-06-27 LAB — SARS CORONAVIRUS 2 BY RT PCR (HOSPITAL ORDER, PERFORMED IN ~~LOC~~ HOSPITAL LAB): SARS Coronavirus 2: NEGATIVE

## 2019-06-27 LAB — CBG MONITORING, ED
Glucose-Capillary: 73 mg/dL (ref 70–99)
Glucose-Capillary: 75 mg/dL (ref 70–99)
Glucose-Capillary: 86 mg/dL (ref 70–99)
Glucose-Capillary: 75 mg/dL (ref 70–99)
Glucose-Capillary: 85 mg/dL (ref 70–99)

## 2019-06-27 LAB — LACTIC ACID, PLASMA
Lactic Acid, Venous: 1.7 mmol/L (ref 0.5–1.9)
Lactic Acid, Venous: 1.1 mmol/L (ref 0.5–1.9)

## 2019-06-27 LAB — APTT: aPTT: 41 seconds — ABNORMAL HIGH (ref 24–36)

## 2019-06-27 LAB — GLUCOSE, CAPILLARY
Glucose-Capillary: 56 mg/dL — ABNORMAL LOW (ref 70–99)
Glucose-Capillary: 63 mg/dL — ABNORMAL LOW (ref 70–99)
Glucose-Capillary: 93 mg/dL (ref 70–99)

## 2019-06-27 MED ORDER — SODIUM CHLORIDE 0.9 % IV SOLN
1.0000 g | INTRAVENOUS | Status: DC
  Administered 2019-06-28 – 2019-06-29 (×2): 1 g via INTRAVENOUS
  Filled 2019-06-27: qty 1
  Filled 2019-06-27 (×3): qty 10

## 2019-06-27 MED ORDER — FUROSEMIDE 40 MG PO TABS
40.0000 mg | ORAL_TABLET | ORAL | Status: DC
  Administered 2019-06-28 – 2019-06-30 (×2): 40 mg via ORAL
  Filled 2019-06-27 (×3): qty 1

## 2019-06-27 MED ORDER — VANCOMYCIN HCL IN DEXTROSE 1-5 GM/200ML-% IV SOLN: 1000.0000 mg | Freq: Once | INTRAVENOUS | Status: DC

## 2019-06-27 MED ORDER — IPRATROPIUM-ALBUTEROL 0.5-2.5 (3) MG/3ML IN SOLN
3.0000 mL | Freq: Four times a day (QID) | RESPIRATORY_TRACT | Status: DC
  Administered 2019-06-27 – 2019-06-28 (×2): 3 mL via RESPIRATORY_TRACT
  Filled 2019-06-27 (×2): qty 3

## 2019-06-27 MED ORDER — ACETAMINOPHEN 325 MG PO TABS: 650.0000 mg | ORAL_TABLET | Freq: Four times a day (QID) | ORAL | Status: DC | PRN

## 2019-06-27 MED ORDER — HEPARIN SODIUM (PORCINE) 5000 UNIT/ML IJ SOLN
5000.0000 [IU] | Freq: Three times a day (TID) | INTRAMUSCULAR | Status: DC
  Administered 2019-06-27 – 2019-06-30 (×6): 5000 [IU] via SUBCUTANEOUS
  Filled 2019-06-27 (×7): qty 1

## 2019-06-27 MED ORDER — SODIUM CHLORIDE 0.9% FLUSH
3.0000 mL | Freq: Two times a day (BID) | INTRAVENOUS | Status: DC
  Administered 2019-06-27 – 2019-06-30 (×3): 3 mL via INTRAVENOUS

## 2019-06-27 MED ORDER — FLUOXETINE HCL 20 MG PO TABS
10.0000 mg | ORAL_TABLET | Freq: Every day | ORAL | Status: DC
  Filled 2019-06-27: qty 1

## 2019-06-27 MED ORDER — OXYMETAZOLINE HCL 0.05 % NA SOLN
1.0000 | Freq: Two times a day (BID) | NASAL | Status: DC | PRN
  Filled 2019-06-27: qty 30

## 2019-06-27 MED ORDER — SEVELAMER CARBONATE 800 MG PO TABS
1600.0000 mg | ORAL_TABLET | Freq: Three times a day (TID) | ORAL | Status: DC
  Administered 2019-06-28 – 2019-06-30 (×6): 1600 mg via ORAL
  Filled 2019-06-27 (×7): qty 2

## 2019-06-27 MED ORDER — DOXERCALCIFEROL 4 MCG/2ML IV SOLN
3.0000 ug | INTRAVENOUS | Status: DC
  Filled 2019-06-27: qty 2

## 2019-06-27 MED ORDER — DOCUSATE SODIUM 100 MG PO CAPS
100.0000 mg | ORAL_CAPSULE | Freq: Every morning | ORAL | Status: DC
  Administered 2019-06-28 – 2019-06-30 (×3): 100 mg via ORAL
  Filled 2019-06-27 (×3): qty 1

## 2019-06-27 MED ORDER — ONDANSETRON HCL 4 MG PO TABS: 4.0000 mg | ORAL_TABLET | Freq: Four times a day (QID) | ORAL | Status: DC | PRN

## 2019-06-27 MED ORDER — PANTOPRAZOLE SODIUM 40 MG PO TBEC
40.0000 mg | DELAYED_RELEASE_TABLET | Freq: Every day | ORAL | Status: DC
  Administered 2019-06-28 – 2019-06-30 (×3): 40 mg via ORAL
  Filled 2019-06-27 (×3): qty 1

## 2019-06-27 MED ORDER — MIDODRINE HCL 5 MG PO TABS
10.0000 mg | ORAL_TABLET | ORAL | Status: DC
  Administered 2019-06-29: 10 mg via ORAL
  Filled 2019-06-27 (×2): qty 2

## 2019-06-27 MED ORDER — VANCOMYCIN HCL 1250 MG/250ML IV SOLN
1250.0000 mg | Freq: Once | INTRAVENOUS | Status: AC
  Administered 2019-06-27: 1250 mg via INTRAVENOUS
  Filled 2019-06-27: qty 250

## 2019-06-27 MED ORDER — ATORVASTATIN CALCIUM 40 MG PO TABS
40.0000 mg | ORAL_TABLET | Freq: Every morning | ORAL | Status: DC
  Administered 2019-06-28 – 2019-06-30 (×3): 40 mg via ORAL
  Filled 2019-06-27 (×2): qty 1

## 2019-06-27 MED ORDER — CLONAZEPAM 0.25 MG PO TBDP: 0.2500 mg | ORAL_TABLET | Freq: Every day | ORAL | Status: DC

## 2019-06-27 MED ORDER — LEVOTHYROXINE SODIUM 112 MCG PO TABS
112.0000 ug | ORAL_TABLET | Freq: Every day | ORAL | Status: DC
  Administered 2019-06-28 – 2019-06-30 (×3): 112 ug via ORAL
  Filled 2019-06-27 (×3): qty 1

## 2019-06-27 MED ORDER — ALBUTEROL SULFATE (2.5 MG/3ML) 0.083% IN NEBU: 2.5000 mg | INHALATION_SOLUTION | Freq: Four times a day (QID) | RESPIRATORY_TRACT | Status: DC | PRN

## 2019-06-27 MED ORDER — SODIUM CHLORIDE 0.9 % IV SOLN
1.0000 g | INTRAVENOUS | Status: DC
  Administered 2019-06-27: 1 g via INTRAVENOUS
  Filled 2019-06-27: qty 1

## 2019-06-27 MED ORDER — VANCOMYCIN HCL 750 MG/150ML IV SOLN
750.0000 mg | INTRAVENOUS | Status: DC
  Filled 2019-06-27: qty 150

## 2019-06-27 MED ORDER — METRONIDAZOLE IN NACL 5-0.79 MG/ML-% IV SOLN
500.0000 mg | Freq: Once | INTRAVENOUS | Status: AC
  Administered 2019-06-27: 500 mg via INTRAVENOUS
  Filled 2019-06-27: qty 100

## 2019-06-27 MED ORDER — NYSTATIN 100000 UNIT/GM EX POWD
1.0000 "application " | Freq: Two times a day (BID) | CUTANEOUS | Status: DC | PRN
  Filled 2019-06-27: qty 15

## 2019-06-27 MED ORDER — SEVELAMER CARBONATE 800 MG PO TABS
800.0000 mg | ORAL_TABLET | ORAL | Status: DC
  Administered 2019-06-28: 800 mg via ORAL

## 2019-06-27 MED ORDER — SODIUM CHLORIDE 0.9 % IV SOLN: 2.0000 g | Freq: Once | INTRAVENOUS | Status: DC

## 2019-06-27 MED ORDER — ONDANSETRON HCL 4 MG/2ML IJ SOLN: 4.0000 mg | Freq: Four times a day (QID) | INTRAMUSCULAR | Status: DC | PRN

## 2019-06-27 MED ORDER — SODIUM CHLORIDE 0.9 % IV SOLN
2.0000 g | Freq: Once | INTRAVENOUS | Status: DC
  Filled 2019-06-27: qty 20

## 2019-06-27 MED ORDER — CLONAZEPAM 0.25 MG PO TBDP
0.5000 mg | ORAL_TABLET | Freq: Every day | ORAL | Status: DC
  Administered 2019-06-27 – 2019-06-29 (×3): 0.5 mg via ORAL
  Filled 2019-06-27 (×3): qty 2

## 2019-06-27 MED ORDER — MONTELUKAST SODIUM 10 MG PO TABS
10.0000 mg | ORAL_TABLET | Freq: Every day | ORAL | Status: DC
  Administered 2019-06-27 – 2019-06-29 (×3): 10 mg via ORAL
  Filled 2019-06-27 (×3): qty 1

## 2019-06-27 MED ORDER — CLONAZEPAM 0.25 MG PO TBDP
0.2500 mg | ORAL_TABLET | Freq: Every day | ORAL | Status: DC
  Administered 2019-06-28 – 2019-06-30 (×3): 0.25 mg via ORAL
  Filled 2019-06-27 (×3): qty 1

## 2019-06-27 MED ORDER — ACETAMINOPHEN 650 MG RE SUPP: 650.0000 mg | Freq: Four times a day (QID) | RECTAL | Status: DC | PRN

## 2019-06-27 NOTE — ED Notes (Signed)
Darcus Edds son and POA would like to get an update [phone]

## 2019-06-27 NOTE — ED Notes (Signed)
MD waiting for lab work to result before starting ABX

## 2019-06-27 NOTE — H&P (Signed)
History and Physical    Joan Mosley:[id number] DOB: February 26, 1945 DOA: 06/27/2019  Referring MD/NP/PA: Pattricia Boss, MD PCP: Flossie Buffy, NP  Patient coming from: via EMS  Chief Complaint: Altered  I have personally briefly reviewed patient's old medical records in [place]   HPI: Joan Mosley is a 74 y.o. female with medical history significant of ESRD on HD, COPD, oxygen dependent, DM type II, hypothyroidism, SDH, choledocholithiasis, and chronic debility after waking up this morning around 6 AM and had a episode of diarrhea.  History is mostly obtained from the patient's son and daughter over the phone who help care for her.  The patient has been dealing with some wounds of her groin and upper legs that they had tried to get her primary care provider to call in some antibiotics to treat, but were unable to get a quick appointment.  Son relates it to the depends that that put on her during dialysis.  Patient reportedly has been scratching the areas when family's not looking.  After her episode of diarrhea this morning they had talked to the hemodialysis center who recommended the get her to the hospital to be evaluated.  At home they note that she had only been eating 2 small meals daily.  Initial blood sugar was noted to be 38 and she was given 30 g of oral glucose with repeat CBG 181.  Patient normally on Levemir 10 units nightly.  She states that she has been vomiting and short of breath over the last several days, but family denies theses reports. She has also  Patient had just been hospitalized from 5/4 -5/12 with acute metabolic encephalopathy secondary to be UTI, subdural hematoma, and choledocholithiasis.  During the hospitalization patient was placed on empiric antibiotics and underwent ERCP with sphincterectomy for removal of a common bile duct stone.  Subdural hematoma was evaluated by neurosurgery, but no surgical intervention was recommended.  Family notes since  last hospitalization patient has been little altered at times.  ED Course: Upon admission into the emergency department patient was seen to have a temperature of 96.4 F, respiration 14-24, and oxygen saturation maintained on 2 L of nasal cannula oxygen.  Initial glucose check in the ED was 73.  Labs significant for WBC 13.2, sodium 131, potassium 4, BUN 39, creatinine 5.74, glucose 149, and lactic acid 1.7.  Urinalysis was noted to be abnormal and possibly contaminant.  Chest x-ray was otherwise noted to be clear.  Patient had been ordered   broad-spectrum antibiotics vancomycin, cefepime, metronidazole.  Review of Systems  Unable to perform ROS: Mental status change  Constitutional: Positive for malaise/fatigue.  Eyes: Negative for photophobia and pain.  Cardiovascular: Negative for leg swelling.  Gastrointestinal: Positive for abdominal pain and diarrhea.  Skin: Positive for itching and rash.       Positive for wound of the legs     Past Medical History:  Diagnosis Date  . Arthritis   . COPD (chronic obstructive pulmonary disease) ([place])   . Diabetic neuropathy ([place])   . 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 Renal disorder    ESRD  . Sleep apnea    wears BPAP at home at night  . Thyroid disease   . TIA (transient ischemic attack)   . Type 2 diabetes mellitus with peripheral neuropathy [facility])     Past Surgical History:  Procedure Laterality Date  . ABDOMINAL HYSTERECTOMY    . ENDOSCOPIC RETROGRADE CHOLANGIOPANCREATOGRAPHY (ERCP)  WITH PROPOFOL N/A 06/02/2019   Procedure: ENDOSCOPIC RETROGRADE CHOLANGIOPANCREATOGRAPHY (ERCP) WITH PROPOFOL;  Surgeon: [name], MD;  Location: [facility] ENDOSCOPY;  Service: Gastroenterology;  Laterality: N/A;  . IR FLUORO GUIDE CV LINE RIGHT  11/14/2018  . RECTOCELE REPAIR    . REMOVAL OF STONES  06/02/2019   Procedure: REMOVAL OF STONES;  Surgeon: [name], MD;  Location: [facility] ENDOSCOPY;  Service: Gastroenterology;;  . REPLACEMENT TOTAL  KNEE BILATERAL Bilateral 2008  . SPHINCTEROTOMY  06/02/2019   Procedure: SPHINCTEROTOMY;  Surgeon: [name], MD;  Location: [facility] ENDOSCOPY;  Service: Gastroenterology;;  . THYROIDECTOMY     80%  . VAGINAL WOUND CLOSURE / REPAIR       reports that she quit smoking about 38 years ago. She has a 35.00 pack-year smoking history. She has never used smokeless tobacco. She reports that she does not drink alcohol or use drugs.  Allergies  Allergen Reactions  . Avelox [Moxifloxacin Hcl In Nacl] Other (See Comments)    Unknown reaction  . Codeine Anaphylaxis  . Penicillins Rash    Did it involve swelling of the face/tongue/throat, SOB, or low BP? Unk Did it involve sudden or severe rash/hives, skin peeling, or any reaction on the inside of your mouth or nose? Yes Did you need to seek medical attention at a hospital or doctor's office? Unk When did it last happen? Unk If all above answers are "NO", may proceed with cephalosporin use.   . Sulfa Antibiotics Other (See Comments)    Unknown reaction  . Dextromethorphan-Guaifenesin Other (See Comments)    Reported by [facility] - unknown reaction. Pt states she is not allergic to dextromethorphan-guaifenesin.  . Shellfish Allergy Hives, Itching and Rash    Family History  Problem Relation Age of Onset  . Heart disease Mother   . Hypertension Mother   . Heart disease Father   . Hypertension Father     Prior to Admission medications   Medication Sig Start Date End Date Taking? Authorizing Provider  acetaminophen (TYLENOL) 325 MG tablet Take 650 mg by mouth every 6 (six) hours as needed (for arthritic pain).    Yes [provider]  atorvastatin (LIPITOR) 40 MG tablet Take 1 tablet (40 mg total) by mouth daily. Patient taking differently: Take 40 mg by mouth in the morning.  05/16/19  Yes Medina-Vargas, Monina C, NP  clonazePAM (KLONOPIN) 0.5 MG tablet Take 0.25-0.5 mg by mouth See admin instructions. Take 0.25 mg by mouth in the  morning and 0.5 mg in the evening 05/20/19  Yes [provider]  Darbepoetin Alfa (ARANESP) 25 MCG/0.42ML SOSY injection Inject 0.42 mLs (25 mcg total) into the vein every Tuesday with hemodialysis. 11/22/18  Yes Hosie Poisson, MD  docusate sodium (COLACE) 100 MG capsule Take 100 mg by mouth every morning.   Yes [provider]  doxercalciferol (HECTOROL) 4 MCG/2ML injection Inject 1.5 mLs (3 mcg total) into the vein Every Tuesday,Thursday,and Saturday with dialysis. 04/25/19  Yes Hosie Poisson, MD  FLUoxetine (PROZAC) 10 MG tablet Take 1 tablet (10 mg total) by mouth daily. 05/16/19  Yes Medina-Vargas, Monina C, NP  furosemide (LASIX) 20 MG tablet Take 40 mg by mouth See admin instructions. Take 40 mg by mouth in the morning on non-dialysis days: Sun/Mon/Wed/Fri   Yes [provider]  insulin detemir (LEVEMIR FLEXTOUCH) 100 UNIT/ML FlexPen Inject 10 Units into the skin at bedtime.   Yes [provider]  ipratropium-albuterol (DUONEB) 0.5-2.5 (3) MG/3ML SOLN Take 3  mLs by nebulization 4 (four) times daily.   Yes [provider]  levalbuterol Penne Lash HFA) 45 MCG/ACT inhaler Inhale 2 puffs into the lungs every 4 (four) hours as needed for wheezing. 05/16/19  Yes Medina-Vargas, Monina C, NP  levothyroxine (EUTHYROX) 112 MCG tablet Take 112 mcg by mouth daily before breakfast.   Yes [provider]  Lubricants (K-Y LUBRICANT JELLY SENSITIVE EX) Place 1 application into both nostrils as needed (as directed- for lubrication).    Yes [provider]  midodrine (PROAMATINE) 10 MG tablet Take 1 tablet (10 mg total) by mouth 3 (three) times daily. Patient taking differently: Take 10 mg by mouth 3 (three) times a week. Tues,Thur,Sat 45 min. Prior to  HD treatment 06/07/19  Yes Thurnell Lose, MD  montelukast (SINGULAIR) 10 MG tablet Take 1 tablet (10 mg total) by mouth at bedtime. 05/16/19  Yes Medina-Vargas, Monina C, NP  NON FORMULARY Inhale into the  lungs at bedtime. "BiPAP IPAP 18, EPAP 5, flow rate 5": At bedtime and during the day as needed for shortness of breath when napping   Yes [provider]  nystatin (MYCOSTATIN/NYSTOP) powder Apply 1 application topically 2 (two) times daily as needed (as directed- to any rashes). Patient taking differently: Apply 1 application topically 2 (two) times daily as needed (as directed, under skin folds and/or under the breasts).  05/16/19  Yes Medina-Vargas, Monina C, NP  nystatin cream (MYCOSTATIN) Apply 1 application topically 2 (two) times daily as needed (as directed, under skin folds and/or under the breasts).   Yes [provider]  OXYGEN Inhale 5-6 L/min into the lungs continuous.    Yes [provider]  oxymetazoline (AFRIN) 0.05 % nasal spray Place 1 spray into both nostrils 2 (two) times daily as needed for congestion.   Yes [provider]  pantoprazole (PROTONIX) 40 MG tablet Take 40 mg by mouth daily before breakfast.  05/20/19  Yes [provider]  rosuvastatin (CRESTOR) 40 MG tablet Take 40 mg by mouth in the morning.   Yes [provider]  sevelamer carbonate (RENVELA) 800 MG tablet Take 1 tablet (800 mg total) by mouth 3 (three) times daily with meals. Patient taking differently: Take 1,600 mg by mouth 3 (three) times daily with meals. And 1 tablet with a snack 05/16/19  Yes Medina-Vargas, Monina C, NP  SIMPLY SALINE NA Place 2 sprays into both nostrils as needed (for congestion).    Yes [provider]  trolamine salicylate (ASPER-FLEX) 10 % cream Apply 1 application topically as needed for muscle pain (to painful sites).   Yes [provider]  ascorbic acid (VITAMIN C) 500 MG tablet Take 1 tablet (500 mg total) by mouth daily. Patient not taking: Reported on 06/27/2019 02/23/19   Hosie Poisson, MD  benzonatate (TESSALON) 200 MG capsule Take 1 capsule (200 mg total) by mouth 2 (two) times daily as needed for cough. Patient  not taking: Reported on 06/27/2019 02/08/19   Nche, Charlene Brooke, NP  levothyroxine (SYNTHROID) 112 MCG tablet Take 1 tablet (112 mcg total) by mouth daily before breakfast. 1 AM Patient not taking: Reported on 05/30/2019 05/16/19   Medina-Vargas, Monina C, NP  Lidocaine (ASPERCREME LIDOCAINE) 4 % PTCH Apply 1 patch topically daily as needed (Pain). Patient not taking: Reported on 05/30/2019 05/16/19   Medina-Vargas, Monina C, NP  Nutritional Supplements (FEEDING SUPPLEMENT, NEPRO CARB STEADY,) LIQD Take 237 mLs by mouth 2 (two) times daily between meals. Patient not taking: Reported on  05/30/2019 11/22/18   Hosie Poisson, MD  heparin 5000 UNIT/ML injection Inject 1.5 mLs (7,500 Units total) into the skin every 8 (eight) hours. 02/22/19 04/20/19  Hosie Poisson, MD    Physical Exam:   Constitutional: Morbidly obese female who appears to be in no acute distress Vitals:   06/27/19 1415 06/27/19 1430 06/27/19 1445 06/27/19 1500  BP:  101/70 102/69 117/77  Pulse: 88 87 87 89  Resp: (!) 22 17 19  (!) 24  Temp:      TempSrc:      SpO2: 99% 100% 97% 94%   Eyes: PERRL, lids and conjunctivae normal ENMT: Mucous membranes are moist. Posterior pharynx clear of any exudate or lesions.  Neck: normal, supple, no masses, no thyromegaly Respiratory: Decreased overall air movement.  Patient currently on 5 L nasal cannula oxygen. Cardiovascular: Regular rate and rhythm, no murmurs / rubs / gallops. No extremity edema. 2+ pedal pulses. No carotid bruits.  Abdomen: Lower abdominal tenderness to palpation appreciated, no masses palpated. No hepatosplenomegaly. Bowel sounds positive.  Musculoskeletal: no clubbing / cyanosis. No joint deformity upper and lower extremities. Good ROM, no contractures. Normal muscle tone.  Skin:    rash noted of the bilateral thighs and inguinal region Neurologic: CN 2-12 grossly intact. Sensation intact, DTR normal. Strength 5/5 in all 4.  Psychiatric: Confused. Alert and oriented x person.        Labs on Admission: I have personally reviewed following labs and imaging studies  CBC: Recent Labs  Lab 06/27/19 1230  WBC 13.2*  NEUTROABS 10.9*  HGB 10.9*  HCT 36.4  MCV 94.1  PLT 093   Basic Metabolic Panel: Recent Labs  Lab 06/27/19 1230  NA 131*  K 4.0  CL 90*  CO2 22  GLUCOSE 149*  BUN 39*  CREATININE 5.74*  CALCIUM 8.9   GFR: CrCl cannot be calculated (Unknown ideal weight.). Liver Function Tests: Recent Labs  Lab 06/27/19 1230  AST 33  ALT 14  ALKPHOS 145*  BILITOT 0.7  PROT 5.9*  ALBUMIN 2.1*   No results for input(s): LIPASE, AMYLASE in the last 168 hours. No results for input(s): AMMONIA in the last 168 hours. Coagulation Profile: Recent Labs  Lab 06/27/19 1230  INR 1.0   Cardiac Enzymes: No results for input(s): CKTOTAL, CKMB, CKMBINDEX, TROPONINI in the last 168 hours. BNP (last 3 results) Recent Labs    04/10/19 1418  PROBNP 81.0   HbA1C: No results for input(s): HGBA1C in the last 72 hours. CBG: Recent Labs  Lab 06/27/19 1200 06/27/19 1250 06/27/19 1322 06/27/19 1455  GLUCAP 73 75 86 75   Lipid Profile: No results for input(s): CHOL, HDL, LDLCALC, TRIG, CHOLHDL, LDLDIRECT in the last 72 hours. Thyroid Function Tests: No results for input(s): TSH, T4TOTAL, FREET4, T3FREE, THYROIDAB in the last 72 hours. Anemia Panel: No results for input(s): VITAMINB12, FOLATE, FERRITIN, TIBC, IRON, RETICCTPCT in the last 72 hours. Urine analysis:    Component Value Date/Time   COLORURINE YELLOW 06/27/2019 1406   APPEARANCEUR TURBID (A) 06/27/2019 1406   LABSPEC 1.014 06/27/2019 1406   PHURINE 5.0 06/27/2019 1406   GLUCOSEU NEGATIVE 06/27/2019 1406   HGBUR MODERATE (A) 06/27/2019 1406   BILIRUBINUR NEGATIVE 06/27/2019 1406   KETONESUR NEGATIVE 06/27/2019 1406   PROTEINUR 100 (A) 06/27/2019 1406   NITRITE NEGATIVE 06/27/2019 1406   LEUKOCYTESUR SMALL (A) 06/27/2019 1406   Sepsis Labs: No results found for this or any  previous visit (from the past 240 hour(s)).  Radiological Exams on Admission: DG Chest Port 1 View  Result Date: 06/27/2019 CLINICAL DATA:  74 year old female with weakness. Diarrhea for 2-3 days. EXAM: PORTABLE CHEST 1 VIEW COMPARISON:  Chest radiographs 05/30/2019 and earlier. FINDINGS: Portable AP semi upright view at 1253 hours. Stable right chest dialysis catheter. Stable lung volumes and mediastinal contours. Stable pulmonary vascularity without overt edema. Allowing for portable technique the lungs are clear. Visualized tracheal air column is within normal limits. No acute osseous abnormality identified. IMPRESSION: No acute cardiopulmonary abnormality. Electronically Signed   By: Genevie Ann M.D.   On: 06/27/2019 13:00    EKG: Independently reviewed. 90 bpm boarderline QT interval.  Assessment/Plan Suspect complicated urinary tract infection: Patient presented with concern for sepsis due to low temperature and elevated white blood cell count.  On physical exam patient noted to have some suprapubic tenderness.  Work-up gave concern for possibility of a urinary tract infection.  Patient just recently had been hospitalized last month or complicated UTI she had initially been ordered empiric antibiotics vancomycin, metronidazole, and cefepime.  -Admit to a medical telemetry bed -Follow-up urine and blood cultures  -Changed antibiotics to Rocephin    Hypoglycemia, due to insulin: Acute.  Patient's initial blood glucose noted to be around 38.  Patient had been given oral glucose in route with EMS.  Home medications include Levemir 10 units nightly. -Hypoglycemic protocols -Hold Levemir -CBGs every 4 hours -Treat for episodes of hypoglycemia as needed  Candidal skin infection: Patient noted to have rash of the bilateral thighs and an area.  Family had thought symptoms were provoked by peds patient has to wear during hemodialysis. -Continue nystatin cream -Wound care consult  Metabolic  encephalopathy: Patient noted to be altered, but was found to have blood sugars as low as 38.  At baseline since last hospitalization with subdural family notes patient has been intermittently altered. -Continue to monitor  ESRD on HD: Patient normally dialyzes Tuesday, Thursday, Saturday. -Nephrology notified of the patient being admitted to the hospital for likely need of HD  COPD, chronic respiratory failure with hypoxia/OSA -Continue nasal cannula oxygen per home regimen -BiPAP nightly and at night  Hypothyroidism -Continue levothyroxine  Choledocholithiasis: Patient underwent ERCP with enterectomy and removal of common bile duct stones on 5/7.  DVT prophylaxis: heparin Code Status: Full Family Communication: Son and daughter updated over the phone\ Disposition Plan: Possible discharge home in 2 to 3 days. Consults called: nephrololgy  Admission status: Inpatient    Norval Morton MD [facility] Pager [phone]   If 7PM-7AM, please contact night-coverage [email or web address] Password [facility]  06/27/2019, 3:31 PM

## 2019-06-27 NOTE — Progress Notes (Signed)
PT CBG-63, sprite given, recheck CBG-56 grape juice and dinner. Current CBG-93.

## 2019-06-27 NOTE — Progress Notes (Signed)
Blood cultures Dc'ed, asking bedside RN to make note

## 2019-06-27 NOTE — Progress Notes (Signed)
Placed patient on BIPAP, via FFM 18/5 per order for HS use. Tolerating well at this time.

## 2019-06-27 NOTE — ED Triage Notes (Signed)
Pt bib gcems from home w/ c/o hypoglycemia. Pt was being transferred to dialysis by [facility] and her cbg was initially 38. Pt received 30g total of oral glucose, w/ cbg up to 181. Pt came to ED instead of dialysis. Pt is Tues, Thurs, Sat, dialysis pt, last treatment on Sat. EMS VSS, pt AOx4 on arrival to ED.

## 2019-06-27 NOTE — ED Notes (Signed)
Pt given 8 oz orange juice.  

## 2019-06-27 NOTE — Progress Notes (Signed)
BIPAP set up at bedside 18/5 per order. Patient not ready at this time. RT will follow up.

## 2019-06-27 NOTE — Progress Notes (Signed)
1400 blood cultures reordered @1347 

## 2019-06-27 NOTE — ED Provider Notes (Signed)
[facility] EMERGENCY DEPARTMENT Provider Note   CSN: [id number] Arrival date & time: 06/27/19  1152     History Chief Complaint  Patient presents with  . Hypoglycemia    Joan Mosley is a 74 y.o. female.  HPI    74 year old female presents today from EMS with reports of altered mental status and hypoglycemia.  Patient was being transported for dialysis.  EMS reported that she appeared confused and her blood sugar was 38.  She received 30 g of oral glucose reporting CBG at 181.  Patient transported to ED.  On arrival here blood sugar 73.  Patient without other complaints. Family reports to rn that patient takes 10 u levemir qhs and has not had change in meds or po intake  Past Medical History:  Diagnosis Date  . Arthritis   . COPD (chronic obstructive pulmonary disease) ([place])   . Diabetic neuropathy ([place])   . 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 Renal disorder    ESRD  . Sleep apnea    wears BPAP at home at night  . Thyroid disease   . TIA (transient ischemic attack)   . Type 2 diabetes mellitus with peripheral neuropathy [facility])     Patient Active Problem List   Diagnosis Date Noted  . Pressure injury of skin 06/02/2019  . Urinary tract infection without hematuria 05/31/2019  . Symptomatic anemia 05/04/2019  . Neurocognitive deficits 04/27/2019  . Adult failure to thrive   . Acute encephalopathy 04/20/2019  . Stroke ([place]) 04/20/2019  . Dysphagia 03/29/2019  . Aspiration into airway   . Pneumonia due to COVID-19 virus 02/09/2019  . Hypotension 12/24/2018  . Aspiration pneumonia of right lower lobe ([place])   . COPD (chronic obstructive pulmonary disease) ([place]) 11/11/2018  . TIA (transient ischemic attack)   . HLD (hyperlipidemia)   . Type II diabetes mellitus with renal manifestations ([place])   . Depression with anxiety   . Advanced care planning/counseling discussion   . Goals of care, counseling/discussion   . Palliative care by  specialist   . Pulmonary edema 09/08/2018  . Respiratory failure ([place]) 09/08/2018  . Weakness   . Chronic respiratory failure with hypoxia, on home O2 therapy ([place]) 09/06/2018  . Subdural hematoma ([place]) 08/22/2018  . Elevated troponin 08/22/2018  . ESRD on dialysis ([place]) 08/22/2018  . Anxiety 07/04/2018  . Counseling regarding advanced directives and goals of care 07/01/2018  . Full code status 07/01/2018  . Drug-induced constipation 07/01/2018  . Hemodialysis-associated hypotension 07/01/2018  . Dialysis patient ([place]) 07/01/2018  . HOH (hard of hearing) 07/01/2018  . Stage 5 chronic kidney disease on chronic dialysis ([place]) 07/01/2018  . Type 2 diabetes mellitus with diabetic polyneuropathy, with long-term current use of insulin ([place]) 07/01/2018  . Hypothyroidism 07/01/2018  . Gait instability 07/01/2018  . Hyperkalemia 04/20/2018  . Cerebral infarction, unspecified ([place]) 04/05/2018  . Chronic diastolic (congestive) heart failure ([place]) 04/05/2018  . Diarrhea, unspecified 04/05/2018  . Pain, unspecified 04/05/2018  . Pruritus, unspecified 04/05/2018  . Shortness of breath 04/05/2018  . Coagulation defect, unspecified ([place]) 03/31/2018  . Anemia in chronic kidney disease 03/25/2018  . Iron deficiency anemia, unspecified 03/25/2018  . Secondary hyperparathyroidism of renal origin ([place]) 03/25/2018    Past Surgical History:  Procedure Laterality Date  . ABDOMINAL HYSTERECTOMY    . ENDOSCOPIC RETROGRADE CHOLANGIOPANCREATOGRAPHY (ERCP) WITH PROPOFOL N/A 06/02/2019   Procedure: ENDOSCOPIC RETROGRADE CHOLANGIOPANCREATOGRAPHY (ERCP) WITH PROPOFOL;  Surgeon: [name], MD;  Location: [facility] ENDOSCOPY;  Service: Gastroenterology;  Laterality: N/A;  . IR FLUORO GUIDE CV LINE RIGHT  11/14/2018  . RECTOCELE REPAIR    . REMOVAL OF STONES  06/02/2019   Procedure: REMOVAL OF STONES;  Surgeon: [name], MD;  Location: [facility] ENDOSCOPY;  Service: Gastroenterology;;  . REPLACEMENT TOTAL KNEE  BILATERAL Bilateral 2008  . SPHINCTEROTOMY  06/02/2019   Procedure: SPHINCTEROTOMY;  Surgeon: [name], MD;  Location: [facility] ENDOSCOPY;  Service: Gastroenterology;;  . THYROIDECTOMY     80%  . VAGINAL WOUND CLOSURE / REPAIR       OB History   No obstetric history on file.     Family History  Problem Relation Age of Onset  . Heart disease Mother   . Hypertension Mother   . Heart disease Father   . Hypertension Father     Social History   Tobacco Use  . Smoking status: Former Smoker    Packs/day: 1.00    Years: 35.00    Pack years: 35.00    Quit date: 03/29/1981    Years since quitting: 38.2  . Smokeless tobacco: Never Used  Substance Use Topics  . Alcohol use: Never  . Drug use: Never    Home Medications Prior to Admission medications   Medication Sig Start Date End Date Taking? Authorizing Provider  acetaminophen (TYLENOL) 325 MG tablet Take 650 mg by mouth every 6 (six) hours as needed (for arthritic pain).     [provider]  ascorbic acid (VITAMIN C) 500 MG tablet Take 1 tablet (500 mg total) by mouth daily. 02/23/19   Hosie Poisson, MD  atorvastatin (LIPITOR) 40 MG tablet Take 1 tablet (40 mg total) by mouth daily. Patient taking differently: Take 40 mg by mouth in the morning.  05/16/19   Medina-Vargas, Monina C, NP  b complex-vitamin c-folic acid (NEPHRO-VITE) 0.8 MG TABS tablet Take 1 tablet by mouth at bedtime.    [provider]  benzonatate (TESSALON) 200 MG capsule Take 1 capsule (200 mg total) by mouth 2 (two) times daily as needed for cough. 02/08/19   Nche, Charlene Brooke, NP  clonazePAM (KLONOPIN) 0.5 MG tablet Take 0.25-0.5 mg by mouth See admin instructions. Take 0.25 mg by mouth in the morning and 0.5 mg in the evening 05/20/19   [provider]  Darbepoetin Alfa (ARANESP) 25 MCG/0.42ML SOSY injection Inject 0.42 mLs (25 mcg total) into the vein every Tuesday with hemodialysis. 11/22/18   Hosie Poisson, MD  docusate sodium  (COLACE) 100 MG capsule Take 100 mg by mouth every morning.    [provider]  doxercalciferol (HECTOROL) 4 MCG/2ML injection Inject 1.5 mLs (3 mcg total) into the vein Every Tuesday,Thursday,and Saturday with dialysis. 04/25/19   Hosie Poisson, MD  FLUoxetine (PROZAC) 10 MG tablet Take 1 tablet (10 mg total) by mouth daily. 05/16/19   Medina-Vargas, Monina C, NP  furosemide (LASIX) 20 MG tablet Take 40 mg by mouth See admin instructions. Take 40 mg by mouth in the morning on non-dialysis days: Sun/Mon/Wed/Fri    [provider]  insulin detemir (LEVEMIR FLEXTOUCH) 100 UNIT/ML FlexPen Inject 10 Units into the skin at bedtime.    [provider]  ipratropium-albuterol (DUONEB) 0.5-2.5 (3) MG/3ML SOLN Take 3 mLs by nebulization 4 (four) times daily.    [provider]  levalbuterol Penne Lash HFA) 45 MCG/ACT inhaler Inhale 2 puffs into the lungs every 4 (four) hours as needed for wheezing. 05/16/19   Medina-Vargas, Senaida Lange, NP  levothyroxine (EUTHYROX) 112 MCG tablet Take 112 mcg by mouth daily before breakfast.    [provider]  levothyroxine (SYNTHROID) 112 MCG tablet Take 1 tablet (112 mcg total) by mouth daily before breakfast. 1 AM Patient not taking: Reported on 05/30/2019 05/16/19   Medina-Vargas, Monina C, NP  Lidocaine (ASPERCREME LIDOCAINE) 4 % PTCH Apply 1 patch topically daily as needed (Pain). Patient not taking: Reported on 05/30/2019 05/16/19   Medina-Vargas, Monina C, NP  Lubricants (K-Y LUBRICANT JELLY SENSITIVE EX) Place 1 application into both nostrils as needed (as directed- for lubrication).     [provider]  midodrine (PROAMATINE) 10 MG tablet Take 1 tablet (10 mg total) by mouth 3 (three) times daily. 06/07/19   Thurnell Lose, MD  montelukast (SINGULAIR) 10 MG tablet Take 1 tablet (10 mg total) by mouth at bedtime. 05/16/19   Medina-Vargas, Senaida Lange, NP  NON FORMULARY Renal Diet Mechanical soft Nectar thick liquids    [provider]  NON FORMULARY Inhale into the lungs at bedtime. "BiPAP IPAP 18, EPAP 5, flow rate 5": At bedtime and during the day as needed for shortness of breath when napping    [provider]  Nutritional Supplements (FEEDING SUPPLEMENT, NEPRO CARB STEADY,) LIQD Take 237 mLs by mouth 2 (two) times daily between meals. Patient not taking: Reported on 05/30/2019 11/22/18   Hosie Poisson, MD  Nutritional Supplements (THICK-IT MIXED FRUIT/BERRY) MISC Take 1.5 L by mouth See admin instructions. May drink up to a total of 1.5 liters a day    [provider]  nystatin (MYCOSTATIN/NYSTOP) powder Apply 1 application topically 2 (two) times daily as needed (as directed- to any rashes). Patient taking differently: Apply 1 application topically 2 (two) times daily as needed (as directed, under skin folds and/or under the breasts).  05/16/19   Medina-Vargas, Monina C, NP  nystatin cream (MYCOSTATIN) Apply 1 application topically 2 (two) times daily as needed (as directed, under skin folds and/or under the breasts).    [provider]  OXYGEN Inhale 5-6 L/min into the lungs continuous.     [provider]  oxymetazoline (AFRIN) 0.05 % nasal spray Place 1 spray into both nostrils 2 (two) times daily as needed for congestion.    [provider]  pantoprazole (PROTONIX) 40 MG tablet Take 40 mg by mouth daily before breakfast.  05/20/19   [provider]  rosuvastatin (CRESTOR) 40 MG tablet Take 40 mg by mouth in the morning.    [provider]  senna (SENOKOT) 8.6 MG TABS tablet Take 1 tablet by mouth every morning.     [provider]  sevelamer carbonate (RENVELA) 800 MG tablet Take 1 tablet (800 mg total) by mouth 3 (three) times daily with meals. 05/16/19   Medina-Vargas, Monina C, NP  SIMPLY SALINE NA Place 2 sprays into both nostrils as needed (for congestion).     [provider]  trolamine salicylate (ASPER-FLEX) 10 % cream Apply  1 application topically as needed for muscle pain (to painful sites).    [provider]  heparin 5000 UNIT/ML injection Inject 1.5 mLs (7,500 Units total) into the skin every 8 (eight) hours. 02/22/19 04/20/19  Hosie Poisson, MD    Allergies    Avelox [moxifloxacin hcl in nacl], Codeine, Penicillins, Sulfa antibiotics, Dextromethorphan-guaifenesin, and Shellfish allergy  Review of Systems   Review of Systems  Physical Exam Updated Vital Signs SpO2 92%   Physical Exam Vitals and nursing note reviewed.  Constitutional:      General: She is not in acute distress.    Appearance: Normal appearance. She is ill-appearing.  HENT:     Head: Normocephalic.     Right Ear: External ear normal.     Left Ear: External ear normal.     Nose: Nose normal.     Mouth/Throat:     Mouth: Mucous membranes are dry.  Eyes:     Pupils: Pupils are equal, round, and reactive to light.  Cardiovascular:     Rate and Rhythm: Normal rate and regular rhythm.     Pulses: Normal pulses.     Heart sounds: Normal heart sounds.  Pulmonary:     Effort: Pulmonary effort is normal.     Breath sounds: Normal breath sounds.  Abdominal:     General: Abdomen is flat.     Palpations: Abdomen is soft.  Musculoskeletal:        General: Normal range of motion.     Cervical back: Normal range of motion.  Skin:    General: Skin is warm and dry.     Capillary Refill: Capillary refill takes less than 2 seconds.     Comments: Contusion leg Atrophy and induration bilateral lower extremities No ttp  Neurological:     General: No focal deficit present.     Mental Status: She is alert.  Psychiatric:        Mood and Affect: Mood normal.     ED Results / Procedures / Treatments   Labs (all labs ordered are listed, but only abnormal results are displayed) Labs Reviewed  CBG MONITORING, ED    EKG EKG Interpretation  Date/Time:  Tuesday June 27 2019 12:35:55 EDT Ventricular Rate:  90 PR Interval:      QRS Duration: 104 QT Interval:  398 QTC Calculation: 487 R Axis:   -39 Text Interpretation: Sinus rhythm Left axis deviation Abnormal R-wave progression, late transition Borderline prolonged QT interval Confirmed by Pattricia Boss [phone]) on 06/27/2019 2:13:43 PM   Radiology DG Chest Port 1 View  Result Date: 06/27/2019 CLINICAL DATA:  74 year old female with weakness. Diarrhea for 2-3 days. EXAM: PORTABLE CHEST 1 VIEW COMPARISON:  Chest radiographs 05/30/2019 and earlier. FINDINGS: Portable AP semi upright view at 1253 hours. Stable right chest dialysis catheter. Stable lung volumes and mediastinal contours. Stable pulmonary vascularity without overt edema. Allowing for portable technique the lungs are clear. Visualized tracheal air column is within normal limits. No acute osseous abnormality identified. IMPRESSION: No acute cardiopulmonary abnormality. Electronically Signed   By: Genevie Ann M.D.   On: 06/27/2019 13:00    Procedures .Critical Care Performed by: Pattricia Boss, MD Authorized by: Pattricia Boss, MD   Critical care provider statement:    Critical care time (minutes):  45   Critical care end time:  06/27/2019 3:34 PM   Critical care was necessary to treat or prevent imminent or life-threatening deterioration of the following conditions:  Sepsis and endocrine crisis   Critical care was time spent personally by me on the following activities:  Discussions with consultants, evaluation of patient's response to treatment, examination of patient, ordering and performing treatments and interventions, ordering and review of laboratory studies, ordering and review of radiographic studies, pulse oximetry, re-evaluation of patient's condition, obtaining history from patient or surrogate and review of old charts   (including critical care time)  Medications Ordered in ED Medications - No data to display  ED Course  I have reviewed the triage vital signs  and the nursing notes.  Pertinent labs  & imaging results that were available during my care of the patient were reviewed by me and considered in my medical decision making (see chart for details).  Clinical Course as of Jun 27 1519  Tue Jun 27, 2019  1520 Labs and cxr reviewed   [DR]    Clinical Course User Index [DR] Pattricia Boss, MD   MDM Rules/Calculators/A&P                      74 yo female esrd on dialysis presents today with hypoglycemia.  On evaluation, patient required assist with keeping bs normal x 3.  Leukocytosis, and labs c.w. her esrd although no acute abnormality requiring intervention (k is 4.0) Patient does not appear volume overloaded 1- UTI-bp normal, lactic normal, broad spectrum abx initiated here 2-hypoglycemia 3-esrd on dialysis Discussed with Dr. Tamala Julian who will see for admission Final Clinical Impression(s) / ED Diagnoses Final diagnoses:  Hypoglycemia  Urinary tract infection with hematuria, site unspecified    Rx / DC Orders ED Discharge Orders    None       Pattricia Boss, MD 06/27/19 1534

## 2019-06-27 NOTE — Progress Notes (Signed)
Pharmacy Antibiotic Note  Joan Mosley is a 74 y.o. female admitted on 06/27/2019 with sepsis, source unclear.  Pharmacy has been consulted for vancomycin and cefepime dosing.  Patient has ESRD on HD, likely TTS schedule as she presented to ED before HD today with low CBG. She is afebrile. WBC count slightly elevated at 13.2. CXR with no acute cardiopulmonary abnormality. She does have a history of rash to penicillins but has tolerated ceftriaxone in 05/2019. Last weight from 06/07/19 is 78.9 kg.   Plan: Vancomycin 1250 mg IV x1 then 750 mg IV after each HD session.  Cefepime 1 gram IV every 24 hours  Follow up cultures, clinical status, HD schedule and opportunity for de-escalation.   Temp (24hrs), Avg:96.4 F (35.8 C), Min:96.4 F (35.8 C), Max:96.4 F (35.8 C)  Recent Labs  Lab 06/27/19 1230  WBC 13.2*  CREATININE 5.74*  LATICACIDVEN 1.7    CrCl cannot be calculated (Unknown ideal weight.).    Allergies  Allergen Reactions  . Avelox [Moxifloxacin Hcl In Nacl] Other (See Comments)    Unknown reaction  . Codeine Anaphylaxis  . Penicillins Rash    Did it involve swelling of the face/tongue/throat, SOB, or low BP? Unk Did it involve sudden or severe rash/hives, skin peeling, or any reaction on the inside of your mouth or nose? Yes Did you need to seek medical attention at a hospital or doctor's office? Unk When did it last happen? Unk If all above answers are "NO", may proceed with cephalosporin use.   . Sulfa Antibiotics Other (See Comments)    Unknown reaction  . Dextromethorphan-Guaifenesin Other (See Comments)    Reported by [facility] - unknown reaction. Pt states she is not allergic to dextromethorphan-guaifenesin.  . Shellfish Allergy Hives, Itching and Rash    Antimicrobials this admission: cefepime 6/1 >> vancomycin 6/1 >>   Dose adjustments this admission: N/A  Microbiology results: 6/1 BCx: sent 6/1 UCx: sent    Thank you for allowing pharmacy to be a  part of this patient's care.  Eddie Candle, PharmD PGY-1 Pharmacy Resident   Please check amion for clinical pharmacist contact number  06/27/2019 2:34 PM

## 2019-06-27 NOTE — Progress Notes (Signed)
Notified provider of need to draw blood cultures.

## 2019-06-27 NOTE — Progress Notes (Signed)
.  Notified provider and bedside nurse of need to administer antibiotics.

## 2019-06-28 ENCOUNTER — Ambulatory Visit: Payer: Medicare Other | Admitting: Gastroenterology

## 2019-06-28 DIAGNOSIS — E871 Hypo-osmolality and hyponatremia: Secondary | ICD-10-CM | POA: Diagnosis not present

## 2019-06-28 DIAGNOSIS — E162 Hypoglycemia, unspecified: Secondary | ICD-10-CM

## 2019-06-28 DIAGNOSIS — G9340 Encephalopathy, unspecified: Secondary | ICD-10-CM

## 2019-06-28 DIAGNOSIS — B372 Candidiasis of skin and nail: Secondary | ICD-10-CM | POA: Diagnosis present

## 2019-06-28 LAB — GLUCOSE, CAPILLARY
Glucose-Capillary: 103 mg/dL — ABNORMAL HIGH (ref 70–99)
Glucose-Capillary: 104 mg/dL — ABNORMAL HIGH (ref 70–99)
Glucose-Capillary: 65 mg/dL — ABNORMAL LOW (ref 70–99)
Glucose-Capillary: 69 mg/dL — ABNORMAL LOW (ref 70–99)
Glucose-Capillary: 76 mg/dL (ref 70–99)
Glucose-Capillary: 79 mg/dL (ref 70–99)
Glucose-Capillary: 81 mg/dL (ref 70–99)
Glucose-Capillary: 87 mg/dL (ref 70–99)
Glucose-Capillary: 87 mg/dL (ref 70–99)

## 2019-06-28 LAB — BASIC METABOLIC PANEL
Anion gap: 18 — ABNORMAL HIGH (ref 5–15)
BUN: 45 mg/dL — ABNORMAL HIGH (ref 8–23)
CO2: 21 mmol/L — ABNORMAL LOW (ref 22–32)
Calcium: 9 mg/dL (ref 8.9–10.3)
Chloride: 88 mmol/L — ABNORMAL LOW (ref 98–111)
Creatinine, Ser: 6.23 mg/dL — ABNORMAL HIGH (ref 0.44–1.00)
GFR calc Af Amer: 7 mL/min — ABNORMAL LOW (ref >60)
GFR calc non Af Amer: 6 mL/min — ABNORMAL LOW (ref >60)
Glucose, Bld: 95 mg/dL (ref 70–99)
Potassium: 4.2 mmol/L (ref 3.5–5.1)
Sodium: 127 mmol/L — ABNORMAL LOW (ref 135–145)

## 2019-06-28 LAB — CBC
HCT: 34.5 % — ABNORMAL LOW (ref 36.0–46.0)
Hemoglobin: 10.5 g/dL — ABNORMAL LOW (ref 12.0–15.0)
MCH: 27.6 pg (ref 26.0–34.0)
MCHC: 30.4 g/dL (ref 30.0–36.0)
MCV: 90.6 fL (ref 80.0–100.0)
Platelets: 153 10*3/uL (ref 150–400)
RBC: 3.81 MIL/uL — ABNORMAL LOW (ref 3.87–5.11)
RDW: 18.7 % — ABNORMAL HIGH (ref 11.5–15.5)
WBC: 12.9 10*3/uL — ABNORMAL HIGH (ref 4.0–10.5)
nRBC: 0 % (ref 0.0–0.2)

## 2019-06-28 LAB — HEPATITIS B SURFACE ANTIGEN: Hepatitis B Surface Ag: NONREACTIVE

## 2019-06-28 LAB — TSH: TSH: 3.398 u[IU]/mL (ref 0.350–4.500)

## 2019-06-28 LAB — HEPATITIS B SURFACE ANTIBODY,QUALITATIVE: Hep B S Ab: REACTIVE — AB

## 2019-06-28 MED ORDER — CHLORHEXIDINE GLUCONATE CLOTH 2 % EX PADS
6.0000 | MEDICATED_PAD | Freq: Every day | CUTANEOUS | Status: DC
  Administered 2019-06-28: 6 via TOPICAL

## 2019-06-28 MED ORDER — CHLORHEXIDINE GLUCONATE CLOTH 2 % EX PADS: 6.0000 | MEDICATED_PAD | Freq: Every day | CUTANEOUS | Status: DC

## 2019-06-28 MED ORDER — HEPARIN SODIUM (PORCINE) 1000 UNIT/ML IJ SOLN
INTRAMUSCULAR | Status: AC
  Administered 2019-06-28: 3800 [IU]
  Filled 2019-06-28: qty 4

## 2019-06-28 MED ORDER — FLUOXETINE HCL 10 MG PO CAPS
10.0000 mg | ORAL_CAPSULE | Freq: Every day | ORAL | Status: DC
  Administered 2019-06-28 – 2019-06-30 (×3): 10 mg via ORAL
  Filled 2019-06-28 (×3): qty 1

## 2019-06-28 MED ORDER — SODIUM CHLORIDE 0.9 % IV BOLUS
250.0000 mL | Freq: Once | INTRAVENOUS | Status: AC
  Administered 2019-06-28: 250 mL via INTRAVENOUS

## 2019-06-28 MED ORDER — ALBUMIN HUMAN 25 % IV SOLN
INTRAVENOUS | Status: AC
  Filled 2019-06-28: qty 100

## 2019-06-28 MED ORDER — DARBEPOETIN ALFA 60 MCG/0.3ML IJ SOSY: 60.0000 ug | PREFILLED_SYRINGE | INTRAMUSCULAR | Status: DC

## 2019-06-28 MED ORDER — ALBUMIN HUMAN 25 % IV SOLN
25.0000 g | Freq: Once | INTRAVENOUS | Status: AC
  Administered 2019-06-28: 25 g via INTRAVENOUS

## 2019-06-28 NOTE — Progress Notes (Signed)
Progress Note    Joan Mosley  DJT:[id number] DOB: 09-Oct-1945  DOA: 06/27/2019 PCP: Flossie Buffy, NP    Brief Narrative:    Medical records reviewed and are as summarized below:  Joan Mosley is an 74 y.o. female with a past medical history that includes end-stage renal disease a Tuesday Thursday Saturday dialysis schedule, chronic respiratory failure on home oxygen, type 2 diabetes, hypothyroidism, SDH, collated cholelithiasis, chronic debility wheelchair-bound admitted June 1 for acute encephalopathy presumably related to complicated urinary tract infection and hypoglycemia in setting of metabolic derangement from missing dialysis. Nephrology on board for dialysis  Assessment/Plan:   Principal Problem:   UTI (urinary tract infection) Active Problems:   ESRD on dialysis ([place])   Type II diabetes mellitus with renal manifestations ([place])   Acute encephalopathy   Hyponatremia   Hemodialysis-associated hypotension   Chronic respiratory failure ([place])   Debility   COPD (chronic obstructive pulmonary disease) (HCC)   Candidal skin infection   Hypothyroidism   Pressure injury of skin  #1.  Acute metabolic encephalopathy.  Likely multifactorial.  Specifically missing dialysis, complicated/recurrent urinary tract infection in setting of diarrhea and decreased oral intake.  Patient currently oriented to self and place only.  She is alert interactive.  She is afebrile with soft blood pressure.  Lactic acid within the limits of normal. leukocytosis improving.  Sodium trending down to 127.  Blood culture no growth to date.  Urine culture in process.  No focal deficits on neuro exam -Continue Rocephin -Monitor intake and output as she does produce urine -Dialysis per nephrology -Follow urine culture  #2.  Hypoglycemia.  Likely related to insulin in the setting of missed dialysis.  Home medications include Levemir.  CBGs continue to run low 69-87.  P.o. intake remains  poor -Continue to hold long-acting insulin -Capillary blood sugars every 4 hours -Dialysis per nephrology -Intake and output -nutritional consult  #3.  Urinary tract infection.  Upon presentation there was a concern for sepsis due to low temperature and elevated WBCs.  Patient with a history of recurrent/complicated urinary tract infection.  Note she was hospitalized 3 weeks ago and had a complicated UTI at that time.  In the emergency department she was provided with vancomycin, metronidazole, cefepime.  Upon admission this was changed to Rocephin.  Urinalysis concerning for UTI. -Follow urine culture -Continue Rocephin  #4.  Candidal skin infection.  Evaluated by wound care nurse.  Gary opined fungal rash to skin folds as well as upper posterior thighs.  Recommended nystatin powder low air loss mattress Critic-Aid to be applied thighs and buttocks -Monitor -Keep clean and dry -Out of bed as able  5. ESRD/hyponatremia.  She is a Tuesday Thursday Saturday dialysis patient.  She missed dialysis yesterday.  Potassium 4.2 this morning.  Creatinine 6.23.  See #1 -Dialysis per nephrology  #6.  Chronic respiratory failure related to COPD.  Is on oxygen at home.  BiPAP at night.  Oxygen saturation level greater than 90% on 2 L.  Chest x-ray without acute cardiopulmonary process. -Continue home oxygen -Continue home meds  #7.  Hypothyroidism.  Home medications include Synthroid.  TSH 6.9 in April of this year. -Repeat TSH -Continue home meds  #8.  Choledocholithiasis.  Chart review indicates patient underwent an ERCP on May 7.  Appears stable at baseline.  #9.  Hemodialysis related hypotension.  Blood pressure soft.  250 cc normal saline provided.  Home medications include midodrine 3 times daily. -Monitor -Continue  midodrine  #10.  Debility.  Patient is wheelchair/bedbound.  Family reports she has been "nonweightbearing for 3 years now".  They have a Hoyer lift at home lift her out of bed  into the chair. -Consider PT eval   Family Communication/Anticipated D/C date and plan/Code Status   DVT prophylaxis: heparin Code Status: Full Code.  Family Communication: son on phone Disposition Plan: Status is: Inpatient  Remains inpatient appropriate because:IV treatments appropriate due to intensity of illness or inability to take PO and Inpatient level of care appropriate due to severity of illness   Dispo: The patient is from: Home              Anticipated d/c is to: Home              Anticipated d/c date is: 2 days              Patient currently is not medically stable to d/c.          Medical Consultants:   nephrology  Anti-Infectives:    Rocephin 6/1>>  Subjective:   Sitting up in bed not eating breakfast.  Denies pain or discomfort.  States she is ready to go home  Objective:    Vitals:   06/28/19 0016 06/28/19 0450 06/28/19 0905 06/28/19 0905  BP: (!) 82/60 (!) 82/59  108/65  Pulse: 92 100  88  Resp: 16 18  18   Temp: (!) 97.5 F (36.4 C) 98.1 F (36.7 C)  97.9 F (36.6 C)  TempSrc: Oral Oral  Oral  SpO2: 94% 100% 90% (!) 89%  Weight:      Height:        Intake/Output Summary (Last 24 hours) at 06/28/2019 1142 Last data filed at 06/28/2019 0800 Gross per 24 hour  Intake 180 ml  Output 0 ml  Net 180 ml   Filed Weights   06/27/19 2006  Weight: 77.4 kg    Exam: General: Slightly pale chronically ill-appearing no acute distress : Regular rate and rhythm no murmur gallop or rub no lower extremity edema Respiratory: Respirations slightly shallow with diminished breath sounds throughout.  Hear no wheeze no crackles.  Baseline oxygen in place Abdomen: Nondistended soft positive bowel sounds throughout mild tenderness to lower quadrants palpation no guarding or rebounding Musculoskeletal: Joints without swelling/erythema Skin: Rash bilateral posterior thighs and groin area.  No drainage no odor Neuro alert and oriented to self and place.   Speech clear facial symmetry bilateral grip 5 out of 5 obvious short-term memory deficits  Data Reviewed:   I have personally reviewed following labs and imaging studies:  Labs: Labs show the following:   Basic Metabolic Panel: Recent Labs  Lab 06/27/19 1230 06/28/19 0642  NA 131* 127*  K 4.0 4.2  CL 90* 88*  CO2 22 21*  GLUCOSE 149* 95  BUN 39* 45*  CREATININE 5.74* 6.23*  CALCIUM 8.9 9.0   GFR Estimated Creatinine Clearance: 7.2 mL/min (A) (by C-G formula based on SCr of 6.23 mg/dL (H)). Liver Function Tests: Recent Labs  Lab 06/27/19 1230  AST 33  ALT 14  ALKPHOS 145*  BILITOT 0.7  PROT 5.9*  ALBUMIN 2.1*   No results for input(s): LIPASE, AMYLASE in the last 168 hours. No results for input(s): AMMONIA in the last 168 hours. Coagulation profile Recent Labs  Lab 06/27/19 1230  INR 1.0    CBC: Recent Labs  Lab 06/27/19 1230 06/28/19 0642  WBC 13.2* 12.9*  NEUTROABS 10.9*  --  HGB 10.9* 10.5*  HCT 36.4 34.5*  MCV 94.1 90.6  PLT 158 153   Cardiac Enzymes: No results for input(s): CKTOTAL, CKMB, CKMBINDEX, TROPONINI in the last 168 hours. BNP (last 3 results) Recent Labs    04/10/19 1418  PROBNP 81.0   CBG: Recent Labs  Lab 06/27/19 1455 06/27/19 1704 06/27/19 2034 06/27/19 2115 06/27/19 2153  GLUCAP 75 85 63* 56* 93   D-Dimer: No results for input(s): DDIMER in the last 72 hours. Hgb A1c: No results for input(s): HGBA1C in the last 72 hours. Lipid Profile: No results for input(s): CHOL, HDL, LDLCALC, TRIG, CHOLHDL, LDLDIRECT in the last 72 hours. Thyroid function studies: No results for input(s): TSH, T4TOTAL, T3FREE, THYROIDAB in the last 72 hours.  Invalid input(s): FREET3 Anemia work up: No results for input(s): VITAMINB12, FOLATE, FERRITIN, TIBC, IRON, RETICCTPCT in the last 72 hours. Sepsis Labs: Recent Labs  Lab 06/27/19 1230 06/27/19 2029 06/28/19 0642  WBC 13.2*  --  12.9*  LATICACIDVEN 1.7 1.1  --      Microbiology Recent Results (from the past 240 hour(s))  Blood culture (routine x 2)     Status: None (Preliminary result)   Collection Time: 06/27/19 12:30 PM   Specimen: BLOOD  Result Value Ref Range Status   Specimen Description BLOOD RIGHT ANTECUBITAL  Final   Special Requests   Final    BOTTLES DRAWN AEROBIC AND ANAEROBIC Blood Culture results may not be optimal due to an inadequate volume of blood received in culture bottles   Culture   Final    NO GROWTH < 24 HOURS Performed at [facility], [place] [address] [place], [place] [address]    Report Status PENDING  Incomplete  SARS Coronavirus 2 by RT PCR (hospital order, performed in [facility] hospital lab) Nasopharyngeal Nasopharyngeal Swab     Status: None   Collection Time: 06/27/19  3:39 PM   Specimen: Nasopharyngeal Swab  Result Value Ref Range Status   SARS Coronavirus 2 NEGATIVE NEGATIVE Final    Comment: (NOTE) SARS-CoV-2 target nucleic acids are NOT DETECTED. The SARS-CoV-2 RNA is generally detectable in upper and lower respiratory specimens during the acute phase of infection. The lowest concentration of SARS-CoV-2 viral copies this assay can detect is 250 copies / mL. A negative result does not preclude SARS-CoV-2 infection and should not be used as the sole basis for treatment or other patient management decisions.  A negative result may occur with improper specimen collection / handling, submission of specimen other than nasopharyngeal swab, presence of viral mutation(s) within the areas targeted by this assay, and inadequate number of viral copies (<250 copies / mL). A negative result must be combined with clinical observations, patient history, and epidemiological information. Fact Sheet for Patients:   StrictlyIdeas.no Fact Sheet for Healthcare Providers: BankingDealers.co.za This test is not yet approved or cleared  by the [place] FDA and has  been authorized for detection and/or diagnosis of SARS-CoV-2 by FDA under an Emergency Use Authorization (EUA).  This EUA will remain in effect (meaning this test can be used) for the duration of the COVID-19 declaration under Section 564(b)(1) of the Act, 21 U.S.C. section 360bbb-3(b)(1), unless the authorization is terminated or revoked sooner. Performed at [facility], [place] [address] [place], [place] [address]   Blood culture (routine x 2)     Status: None (Preliminary result)   Collection Time: 06/27/19  8:29 PM   Specimen: BLOOD  Result Value Ref Range  Status   Specimen Description BLOOD LEFT ANTECUBITAL  Final   Special Requests   Final    BOTTLES DRAWN AEROBIC ONLY Blood Culture results may not be optimal due to an inadequate volume of blood received in culture bottles   Culture   Final    NO GROWTH < 12 HOURS Performed at [facility] [address] [place], [place] [address]    Report Status PENDING  Incomplete    Procedures and diagnostic studies:  DG Chest Port 1 View  Result Date: 06/27/2019 CLINICAL DATA:  74 year old female with weakness. Diarrhea for 2-3 days. EXAM: PORTABLE CHEST 1 VIEW COMPARISON:  Chest radiographs 05/30/2019 and earlier. FINDINGS: Portable AP semi upright view at 1253 hours. Stable right chest dialysis catheter. Stable lung volumes and mediastinal contours. Stable pulmonary vascularity without overt edema. Allowing for portable technique the lungs are clear. Visualized tracheal air column is within normal limits. No acute osseous abnormality identified. IMPRESSION: No acute cardiopulmonary abnormality. Electronically Signed   By: Genevie Ann M.D.   On: 06/27/2019 13:00    Medications:   . atorvastatin  40 mg Oral q AM  . Chlorhexidine Gluconate Cloth  6 each Topical Daily  . clonazepam  0.25 mg Oral Daily  . clonazePAM  0.5 mg Oral QHS  . docusate sodium  100 mg Oral q morning - 10a  . [START ON 06/29/2019] doxercalciferol  3 mcg  Intravenous Q T,Th,Sa-HD  . FLUoxetine  10 mg Oral Daily  . furosemide  40 mg Oral Q M,W,F,Su-1800  . heparin  5,000 Units Subcutaneous Q8H  . levothyroxine  112 mcg Oral QAC breakfast  . [START ON 06/29/2019] midodrine  10 mg Oral Q T,Th,Sa-HD  . montelukast  10 mg Oral QHS  . pantoprazole  40 mg Oral QAC breakfast  . sevelamer carbonate  1,600 mg Oral TID WC  . sevelamer carbonate  800 mg Oral With snacks  . sodium chloride flush  3 mL Intravenous Q12H   Continuous Infusions: . cefTRIAXone (ROCEPHIN)  IV       LOS: 1 day   Radene Gunning NP [facility]   How to contact the [facility] Attending or Consulting provider [place] or covering provider during after hours [place], for this patient?  1. Check the care team in [facility] and look for a) attending/consulting TRH provider listed and b) the [facility] team listed 2. Log into [email or web address] and use [place]'s universal password to access. If you do not have the password, please contact the hospital operator. 3. Locate the [facility] provider you are looking for under [facility] and page to a number that you can be directly reached. 4. If you still have difficulty reaching the provider, please page the [facility] (Director on Call) for the Hospitalists listed on amion for assistance.  06/28/2019, 11:42 AM

## 2019-06-28 NOTE — Progress Notes (Signed)
   06/27/19 2013  Pressure Injury 06/27/19 Thigh Anterior;Left;Proximal Unstageable - Full thickness tissue loss in which the base of the injury is covered by slough (yellow, tan, gray, green or brown) and/or eschar (tan, brown or black) in the wound bed.  Date First Assessed/Time First Assessed: 06/27/19 2024   Location: Thigh  Location Orientation: Anterior;Left;Proximal  Staging: Unstageable - Full thickness tissue loss in which the base of the injury is covered by slough (yellow, tan, gray, green or...  Dressing Type Gauze (Comment)  Dressing Clean;Dry;Intact  Site / Wound Assessment Black;Brown;Red  % Wound base Black/Eschar 45%  Wound Length (cm) 3 cm  Wound Width (cm) 4 cm  Wound Depth (cm) 0 cm  Wound Surface Area (cm^2) 12 cm^2  Wound Volume (cm^3) 0 cm^3

## 2019-06-28 NOTE — Progress Notes (Signed)
Patient refused BIPAP for the night.  

## 2019-06-28 NOTE — Progress Notes (Signed)
    Durable Medical Equipment  (From admission, onward)         Start     Ordered   06/28/19 1512  For home use only DME Hospital bed  Once    Question Answer Comment  Length of Need Lifetime   Patient has (list medical condition): ESRD, pressure injuries   The above medical condition requires: Patient requires the ability to reposition frequently   Head must be elevated greater than: 45 degrees   Bed type Semi-electric   Hoyer Lift Yes   Support Surface: Gel Overlay      06/28/19 1523

## 2019-06-28 NOTE — Progress Notes (Signed)
    Durable Medical Equipment  (From admission, onward)         Start     Ordered   06/28/19 1701  For home use only DME Hospital bed  Once    Question Answer Comment  Length of Need Lifetime   Patient has (list medical condition): ESRD, pressure injuries   The above medical condition requires: Patient requires the ability to reposition frequently   Head must be elevated greater than: 45 degrees   Bed type Semi-electric   Hoyer Lift Yes   Support Surface: Low Air loss Mattress      06/28/19 1701

## 2019-06-28 NOTE — Consult Note (Signed)
[place] Nurse Consult Note: Patient receiving care in [facility] 5M10. Reason for Consult: fungal rash to folds Wound type: MASD IT to folds; MASD-IAD to buttocks and upper posterior thighs Pressure Injury POA: Yes/No/NA Measurement: na Wound bed: pink, moist Drainage (amount, consistency, odor) na Periwound: intact Dressing procedure/placement/frequency: Nystatin powder has already been ordered for skin folds. A bed with a mattress with low air loss feature is in the hallway to be placed in her room.  I have added: Apply Criticaid clear (purple and white tube in clean utility) to posterior upper thighs and buttocks after each incontinent episode. Monitor the wound area(s) for worsening of condition such as: Signs/symptoms of infection,  Increase in size,  Development of or worsening of odor, Development of pain, or increased pain at the affected locations.  Notify the medical team if any of these develop.  Thank you for the consult.  Discussed plan of care with the patient and bedside nurse.  [place] nurse will not follow at this time.  Please re-consult the [place] team if needed.  Val Riles, RN, MSN, CWOCN, CNS-BC, pager [phone]

## 2019-06-28 NOTE — TOC Initial Note (Addendum)
Transition of Care [facility]) - Initial/Assessment Note    Patient Details  Name: Joan Mosley MRN: [id number] Date of Birth: Dec 02, 1945  [facility] CM/SW Contact:    Bartholomew Crews, RN Phone Number: [phone] 06/28/2019, 3:27 PM  Clinical Narrative:                  Spoke with patient's son/POA, Joan Mosley, on the phone. He is POA, however, patient lives with her daughter, Joan Mosley, and son, Joan Mosley. At Ryan's request to arrange home needs, NCM spoke with Joan Mosley and Joan Mosley on the phone.    Patient has bipap and oxygen concentrator at home through [facility] [facility]. Patient attends HD on TTS, and uses [facility] for transportation d/t having been nonambulatory and weight-bearing x 3 years. Patient will need [facility] transport at discharge.   Patient is active with [facility] for [facility] RN and PT. Patient will need [place] orders for RN, PT at discharge for resumption of services.   Patient needs hospital bed to decrease risk of worsening pressure injuries. [facility] unable to provide hospital bed. Spoke with liaison at [facility] for referral for hospital bed with gel overlay mattress. Family asked about air mattress, however, per Medicare guidelines patient wounds do not meet criteria.   TOC following for transition needs.   Expected Discharge Plan: [place] Barriers to Discharge: Continued Medical Work up   Patient Goals and CMS Choice   CMS Medicare.gov Compare Post Acute Care list provided to:: Patient Represenative (must comment) Choice offered to / list presented to : [place] / Guardian  Expected Discharge Plan and Services Expected Discharge Plan: [place] In-house Referral: Clinical Social Work Discharge Planning Services: CM Consult Post Acute Care Choice: [place] arrangements for the past 2 months: Mobile Home                 DME Arranged: Hospital bed DME Agency: [facility] Date DME Agency Contacted: 06/28/19 Time DME Agency Contacted:  [id number] Representative spoke with at DME Agency: Thedore Mins HH Arranged: RN, PT [place] Agency: [place] Date [place]: 06/28/19 Time [place]: 1449 Representative spoke with at [place]: Tommi Rumps  Prior Living Arrangements/Services Living arrangements for the past 2 months: Mobile Home Lives with:: Adult Children              Current home services: DME    Activities of Daily Living      Permission Sought/Granted                  Emotional Assessment         Alcohol / Substance Use: Not Applicable Psych Involvement: No (comment)  Admission diagnosis:  UTI (urinary tract infection) [N39.0] Hypoglycemia [E16.2] Urinary tract infection with hematuria, site unspecified [N39.0, R31.9] Patient Active Problem List   Diagnosis Date Noted  . Hyponatremia 06/28/2019  . Candidal skin infection 06/28/2019  . UTI (urinary tract infection) 06/27/2019  . Pressure injury of skin 06/02/2019  . Urinary tract infection without hematuria 05/31/2019  . Symptomatic anemia 05/04/2019  . Neurocognitive deficits 04/27/2019  . Adult failure to thrive   . Acute encephalopathy 04/20/2019  . Stroke ([place]) 04/20/2019  . Dysphagia 03/29/2019  . Aspiration into airway   . Pneumonia due to COVID-19 virus 02/09/2019  . Hypotension 12/24/2018  . Aspiration pneumonia of right lower lobe ([place])   . COPD (chronic obstructive pulmonary disease) ([place]) 11/11/2018  . TIA (transient ischemic attack)   .  HLD (hyperlipidemia)   . Type II diabetes mellitus with renal manifestations ([place])   . Depression with anxiety   . Advanced care planning/counseling discussion   . Goals of care, counseling/discussion   . Palliative care by specialist   . Pulmonary edema 09/08/2018  . Chronic respiratory failure ([place]) 09/08/2018  . Debility   . Chronic respiratory failure with hypoxia, on home O2 therapy ([place]) 09/06/2018  . Subdural hematoma ([place]) 08/22/2018  . Elevated troponin  08/22/2018  . ESRD on dialysis ([place]) 08/22/2018  . Anxiety 07/04/2018  . Counseling regarding advanced directives and goals of care 07/01/2018  . Full code status 07/01/2018  . Drug-induced constipation 07/01/2018  . Hemodialysis-associated hypotension 07/01/2018  . Dialysis patient ([place]) 07/01/2018  . HOH (hard of hearing) 07/01/2018  . Stage 5 chronic kidney disease on chronic dialysis ([place]) 07/01/2018  . Type 2 diabetes mellitus with diabetic polyneuropathy, with long-term current use of insulin ([place]) 07/01/2018  . Hypothyroidism 07/01/2018  . Gait instability 07/01/2018  . Hyperkalemia 04/20/2018  . Cerebral infarction, unspecified ([place]) 04/05/2018  . Chronic diastolic (congestive) heart failure ([place]) 04/05/2018  . Diarrhea, unspecified 04/05/2018  . Pain, unspecified 04/05/2018  . Pruritus, unspecified 04/05/2018  . Shortness of breath 04/05/2018  . Coagulation defect, unspecified ([place]) 03/31/2018  . Anemia in chronic kidney disease 03/25/2018  . Iron deficiency anemia, unspecified 03/25/2018  . Secondary hyperparathyroidism of renal origin ([place]) 03/25/2018   PCP:  Flossie Buffy, NP Pharmacy:   [facility] ([facility]), [place] [address] [place] [address] Phone: [phone] Fax: [phone]     Social Determinants of Health (SDOH) Interventions    Readmission Risk Interventions Readmission Risk Prevention Plan 05/31/2019 10/04/2018  Transportation Screening Complete Complete  Medication Review (RN Care Manager) Complete Complete  PCP or Specialist appointment within 3-5 days of discharge Complete Complete  HRI or [place] Complete Complete  SW Recovery Care/Counseling Consult Complete Complete  Palliative Care Screening Not Applicable Complete  [place] Patient Refused Patient Refused

## 2019-06-28 NOTE — Progress Notes (Signed)
PIV consult: BUE with multiple large bruises. Long 22g PIV placed L forearm distal to large bruise. Please monitor for pain or swelling as discoloration was present previously.

## 2019-06-28 NOTE — Consult Note (Addendum)
[facility] Renal Consultation Note    Indication for Consultation:  Management of ESRD/hemodialysis; anemia, hypertension/volume and secondary hyperparathyroidism PCP: Flossie Buffy, NP   HPI: Joan Mosley is a 74 y.o. 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 Last month's admission included UTI, subacute SDH and treatment of  choledocholithiasis.   She last dialyzed 5.29 for 2hr with post dialysis weight of 79.7.  She presented to the ED yesterday am with hypoglycemia and was admitted with a diagnosis of UTI. She has seen me multiple times in different venues and has no recollection of who I am.  Evaluation in ED included CXR with neg findings.pending BC and urine cultures, WBC 13.2 renal labs as expected. hgb stable UA suggestive of UTI - looks like she had a not so clean catch instead of I/O cath urine.  She missed dialysis and will be dialyzed today. No current SOB, CP, fever, chills, N, V, D. She has minimal functional status at baseline, but can feed herself.  Past Medical History:  Diagnosis Date  . Arthritis   . COPD (chronic obstructive pulmonary disease) ([place])   . Diabetic neuropathy ([place])   . 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 Renal disorder    ESRD  . Sleep apnea    wears BPAP at home at night  . Thyroid disease   . TIA (transient ischemic attack)   . Type 2 diabetes mellitus with peripheral neuropathy [facility])    Past Surgical History:  Procedure Laterality Date  . ABDOMINAL HYSTERECTOMY    . ENDOSCOPIC RETROGRADE CHOLANGIOPANCREATOGRAPHY (ERCP) WITH PROPOFOL N/A 06/02/2019   Procedure: ENDOSCOPIC RETROGRADE CHOLANGIOPANCREATOGRAPHY (ERCP) WITH PROPOFOL;  Surgeon: [name], MD;  Location: [facility];  Service: Gastroenterology;  Laterality: N/A;  . IR FLUORO GUIDE CV LINE RIGHT  11/14/2018  . RECTOCELE REPAIR     . REMOVAL OF STONES  06/02/2019   Procedure: REMOVAL OF STONES;  Surgeon: [name], MD;  Location: [facility] ENDOSCOPY;  Service: Gastroenterology;;  . REPLACEMENT TOTAL KNEE BILATERAL Bilateral 2008  . SPHINCTEROTOMY  06/02/2019   Procedure: SPHINCTEROTOMY;  Surgeon: [name], MD;  Location: [facility] ENDOSCOPY;  Service: Gastroenterology;;  . THYROIDECTOMY     80%  . VAGINAL WOUND CLOSURE / REPAIR     Family History  Problem Relation Age of Onset  . Heart disease Mother   . Hypertension Mother   . Heart disease Father   . Hypertension Father    Social History:  reports that she quit smoking about 38 years ago. She has a 35.00 pack-year smoking history. She has never used smokeless tobacco. She reports that she does not drink alcohol or use drugs. Allergies  Allergen Reactions  . Avelox [Moxifloxacin Hcl In Nacl] Other (See Comments)    Unknown reaction  . Codeine Anaphylaxis  . Penicillins Rash    Did it involve swelling of the face/tongue/throat, SOB, or low BP? Unk Did it involve sudden or severe rash/hives, skin peeling, or any reaction on the inside of your mouth or nose? Yes Did you need to seek medical attention at a hospital or doctor's office? Unk When did it last happen? Unk If all above answers are "NO", may proceed with cephalosporin use.   . Sulfa Antibiotics Other (See Comments)    Unknown reaction  . Dextromethorphan-Guaifenesin Other (See  Comments)    Reported by [facility] - unknown reaction. Pt states she is not allergic to dextromethorphan-guaifenesin.  . Shellfish Allergy Hives, Itching and Rash   Prior to Admission medications   Medication Sig Start Date End Date Taking? Authorizing Provider  acetaminophen (TYLENOL) 325 MG tablet Take 650 mg by mouth every 6 (six) hours as needed (for arthritic pain).    Yes [provider]  atorvastatin (LIPITOR) 40 MG tablet Take 1 tablet (40 mg total) by mouth daily. Patient taking differently: Take 40 mg by  mouth in the morning.  05/16/19  Yes Medina-Vargas, Monina C, NP  clonazePAM (KLONOPIN) 0.5 MG tablet Take 0.25-0.5 mg by mouth See admin instructions. Take 0.25 mg by mouth in the morning and 0.5 mg in the evening 05/20/19  Yes [provider]  Darbepoetin Alfa (ARANESP) 25 MCG/0.42ML SOSY injection Inject 0.42 mLs (25 mcg total) into the vein every Tuesday with hemodialysis. 11/22/18  Yes Hosie Poisson, MD  docusate sodium (COLACE) 100 MG capsule Take 100 mg by mouth every morning.   Yes [provider]  doxercalciferol (HECTOROL) 4 MCG/2ML injection Inject 1.5 mLs (3 mcg total) into the vein Every Tuesday,Thursday,and Saturday with dialysis. 04/25/19  Yes Hosie Poisson, MD  FLUoxetine (PROZAC) 10 MG tablet Take 1 tablet (10 mg total) by mouth daily. 05/16/19  Yes Medina-Vargas, Monina C, NP  furosemide (LASIX) 20 MG tablet Take 40 mg by mouth See admin instructions. Take 40 mg by mouth in the morning on non-dialysis days: Sun/Mon/Wed/Fri   Yes [provider]  insulin detemir (LEVEMIR FLEXTOUCH) 100 UNIT/ML FlexPen Inject 10 Units into the skin at bedtime.   Yes [provider]  ipratropium-albuterol (DUONEB) 0.5-2.5 (3) MG/3ML SOLN Take 3 mLs by nebulization 4 (four) times daily.   Yes [provider]  levalbuterol Penne Lash HFA) 45 MCG/ACT inhaler Inhale 2 puffs into the lungs every 4 (four) hours as needed for wheezing. 05/16/19  Yes Medina-Vargas, Monina C, NP  levothyroxine (EUTHYROX) 112 MCG tablet Take 112 mcg by mouth daily before breakfast.   Yes [provider]  Lubricants (K-Y LUBRICANT JELLY SENSITIVE EX) Place 1 application into both nostrils as needed (as directed- for lubrication).    Yes [provider]  midodrine (PROAMATINE) 10 MG tablet Take 1 tablet (10 mg total) by mouth 3 (three) times daily. Patient taking differently: Take 10 mg by mouth 3 (three) times a week. Tues,Thur,Sat 45 min. Prior to  HD treatment 06/07/19  Yes  Thurnell Lose, MD  montelukast (SINGULAIR) 10 MG tablet Take 1 tablet (10 mg total) by mouth at bedtime. 05/16/19  Yes Medina-Vargas, Monina C, NP  NON FORMULARY Inhale into the lungs at bedtime. "BiPAP IPAP 18, EPAP 5, flow rate 5": At bedtime and during the day as needed for shortness of breath when napping   Yes [provider]  nystatin (MYCOSTATIN/NYSTOP) powder Apply 1 application topically 2 (two) times daily as needed (as directed- to any rashes). Patient taking differently: Apply 1 application topically 2 (two) times daily as needed (as directed, under skin folds and/or under the breasts).  05/16/19  Yes Medina-Vargas, Monina C, NP  nystatin cream (MYCOSTATIN) Apply 1 application topically 2 (two) times daily as needed (as directed, under skin folds and/or under the breasts).   Yes [provider]  OXYGEN Inhale 5-6 L/min into the lungs continuous.    Yes [provider]  oxymetazoline (AFRIN) 0.05 % nasal spray Place 1 spray into both nostrils 2 (two)  times daily as needed for congestion.   Yes [provider]  pantoprazole (PROTONIX) 40 MG tablet Take 40 mg by mouth daily before breakfast.  05/20/19  Yes [provider]  rosuvastatin (CRESTOR) 40 MG tablet Take 40 mg by mouth in the morning.   Yes [provider]  sevelamer carbonate (RENVELA) 800 MG tablet Take 1 tablet (800 mg total) by mouth 3 (three) times daily with meals. Patient taking differently: Take 1,600 mg by mouth 3 (three) times daily with meals. And 1 tablet with a snack 05/16/19  Yes Medina-Vargas, Monina C, NP  SIMPLY SALINE NA Place 2 sprays into both nostrils as needed (for congestion).    Yes [provider]  trolamine salicylate (ASPER-FLEX) 10 % cream Apply 1 application topically as needed for muscle pain (to painful sites).   Yes [provider]  ascorbic acid (VITAMIN C) 500 MG tablet Take 1 tablet (500 mg total) by mouth daily. Patient not  taking: Reported on 06/27/2019 02/23/19   Hosie Poisson, MD  benzonatate (TESSALON) 200 MG capsule Take 1 capsule (200 mg total) by mouth 2 (two) times daily as needed for cough. Patient not taking: Reported on 06/27/2019 02/08/19   Nche, Charlene Brooke, NP  levothyroxine (SYNTHROID) 112 MCG tablet Take 1 tablet (112 mcg total) by mouth daily before breakfast. 1 AM Patient not taking: Reported on 05/30/2019 05/16/19   Medina-Vargas, Monina C, NP  Lidocaine (ASPERCREME LIDOCAINE) 4 % PTCH Apply 1 patch topically daily as needed (Pain). Patient not taking: Reported on 05/30/2019 05/16/19   Medina-Vargas, Monina C, NP  Nutritional Supplements (FEEDING SUPPLEMENT, NEPRO CARB STEADY,) LIQD Take 237 mLs by mouth 2 (two) times daily between meals. Patient not taking: Reported on 05/30/2019 11/22/18   Hosie Poisson, MD  heparin 5000 UNIT/ML injection Inject 1.5 mLs (7,500 Units total) into the skin every 8 (eight) hours. 02/22/19 04/20/19  Hosie Poisson, MD   Current Facility-Administered Medications  Medication Dose Route Frequency Provider Last Rate Last Admin  . acetaminophen (TYLENOL) tablet 650 mg  650 mg Oral Q6H PRN Norval Morton, MD       Or  . acetaminophen (TYLENOL) suppository 650 mg  650 mg Rectal Q6H PRN Smith, Rondell A, MD      . albuterol (PROVENTIL) (2.5 MG/3ML) 0.083% nebulizer solution 2.5 mg  2.5 mg Nebulization Q6H PRN Smith, Rondell A, MD      . atorvastatin (LIPITOR) tablet 40 mg  40 mg Oral q AM Fuller Plan A, MD   40 mg at 06/28/19 0738  . cefTRIAXone (ROCEPHIN) 1 g in sodium chloride 0.9 % 100 mL IVPB  1 g Intravenous Q24H Smith, Rondell A, MD      . Chlorhexidine Gluconate Cloth 2 % PADS 6 each  6 each Topical Daily Norval Morton, MD   6 each at 06/28/19 0943  . clonazePAM (KLONOPIN) disintegrating tablet 0.25 mg  0.25 mg Oral Daily Tamala Julian, Rondell A, MD   0.25 mg at 06/28/19 0942  . clonazePAM (KLONOPIN) disintegrating tablet 0.5 mg  0.5 mg Oral QHS Smith, Rondell A, MD   0.5 mg at  06/27/19 2127  . docusate sodium (COLACE) capsule 100 mg  100 mg Oral q morning - 10a Fuller Plan A, MD   100 mg at 06/28/19 0942  . [START ON 06/29/2019] doxercalciferol (HECTOROL) injection 3 mcg  3 mcg Intravenous Q T,Th,Sa-HD Smith, Rondell A, MD      . FLUoxetine (PROZAC) capsule 10 mg  10 mg  Oral Daily Fuller Plan A, MD   10 mg at 06/28/19 0942  . furosemide (LASIX) tablet 40 mg  40 mg Oral Q M,W,F,Su-1800 Smith, Rondell A, MD   40 mg at 06/28/19 0942  . heparin injection 5,000 Units  5,000 Units Subcutaneous Q8H Fuller Plan A, MD   5,000 Units at 06/28/19 0523  . levothyroxine (SYNTHROID) tablet 112 mcg  112 mcg Oral QAC breakfast Fuller Plan A, MD   112 mcg at 06/28/19 0523  . [START ON 06/29/2019] midodrine (PROAMATINE) tablet 10 mg  10 mg Oral Q T,Th,Sa-HD Smith, Rondell A, MD      . montelukast (SINGULAIR) tablet 10 mg  10 mg Oral QHS Smith, Rondell A, MD   10 mg at 06/27/19 2127  . nystatin (MYCOSTATIN/NYSTOP) topical powder 1 application  1 application Topical BID PRN Fuller Plan A, MD      . ondansetron (ZOFRAN) tablet 4 mg  4 mg Oral Q6H PRN Fuller Plan A, MD       Or  . ondansetron (ZOFRAN) injection 4 mg  4 mg Intravenous Q6H PRN Smith, Rondell A, MD      . oxymetazoline (AFRIN) 0.05 % nasal spray 1 spray  1 spray Each Nare BID PRN Smith, Rondell A, MD      . pantoprazole (PROTONIX) EC tablet 40 mg  40 mg Oral QAC breakfast Fuller Plan A, MD   40 mg at 06/28/19 0738  . sevelamer carbonate (RENVELA) tablet 1,600 mg  1,600 mg Oral TID WC Smith, Rondell A, MD   1,600 mg at 06/28/19 0738  . sevelamer carbonate (RENVELA) tablet 800 mg  800 mg Oral With snacks Tamala Julian, Rondell A, MD      . sodium chloride flush (NS) 0.9 % injection 3 mL  3 mL Intravenous Q12H Fuller Plan A, MD   3 mL at 06/27/19 2127   Labs: Basic Metabolic Panel: Recent Labs  Lab 06/27/19 1230 06/28/19 0642  NA 131* 127*  K 4.0 4.2  CL 90* 88*  CO2 22 21*  GLUCOSE 149* 95  BUN 39* 45*   CREATININE 5.74* 6.23*  CALCIUM 8.9 9.0   Liver Function Tests: Recent Labs  Lab 06/27/19 1230  AST 33  ALT 14  ALKPHOS 145*  BILITOT 0.7  PROT 5.9*  ALBUMIN 2.1*   No results for input(s): LIPASE, AMYLASE in the last 168 hours. No results for input(s): AMMONIA in the last 168 hours. CBC: Recent Labs  Lab 06/27/19 1230 06/28/19 0642  WBC 13.2* 12.9*  NEUTROABS 10.9*  --   HGB 10.9* 10.5*  HCT 36.4 34.5*  MCV 94.1 90.6  PLT 158 153   Cardiac Enzymes: No results for input(s): CKTOTAL, CKMB, CKMBINDEX, TROPONINI in the last 168 hours. CBG: Recent Labs  Lab 06/27/19 1455 06/27/19 1704 06/27/19 2034 06/27/19 2115 06/27/19 2153  GLUCAP 75 85 63* 56* 93   Iron Studies: No results for input(s): IRON, TIBC, TRANSFERRIN, FERRITIN in the last 72 hours. Studies/Results: DG Chest Port 1 View  Result Date: 06/27/2019 CLINICAL DATA:  74 year old female with weakness. Diarrhea for 2-3 days. EXAM: PORTABLE CHEST 1 VIEW COMPARISON:  Chest radiographs 05/30/2019 and earlier. FINDINGS: Portable AP semi upright view at 1253 hours. Stable right chest dialysis catheter. Stable lung volumes and mediastinal contours. Stable pulmonary vascularity without overt edema. Allowing for portable technique the lungs are clear. Visualized tracheal air column is within normal limits. No acute osseous abnormality identified. IMPRESSION: No acute cardiopulmonary abnormality. Electronically Signed  By: Genevie Ann M.D.   On: 06/27/2019 13:00    ROS: As per HPI otherwise negative.  Physical Exam: Vitals:   06/28/19 0016 06/28/19 0450 06/28/19 0905 06/28/19 0905  BP: (!) 82/60 (!) 82/59  108/65  Pulse: 92 100  88  Resp: 16 18  18   Temp: (!) 97.5 F (36.4 C) 98.1 F (36.7 C)  97.9 F (36.6 C)  TempSrc: Oral Oral  Oral  SpO2: 94% 100% 90% (!) 89%  Weight:      Height:         General: chronicalyl ill elderly female noted to be on room air alert  Head: NCAT sclera not icteric MMM- dentition poor  and limited Neck: Supple.  Lungs: CTA bilaterally without wheezes, rales, or rhonchi. Breathing is unlabored. Heart: RRR  Abdomen: soft NT + BS M-S:  Muscle atrophy - upper and lower extremities Lower extremities: Tr LE edema  Psych:  Irritable - you people ask me the same questions Dialysis Access: right IJ So [facility]  Dialysis Orders: T[facility] 4 hr EDW 80 - left below last tmt 5/30 79.7 2k 2 Ca 400/800 profile 4 keep SBP > 95 400/800 Hectorol 3 Mircera 60 q 2 weeks - last 5/22 Recent labs: Hgb 10.8 Ca/P/iPTH ok  Compliance poor - runs 2 - 3 hr - transport via [facility]  Assessment/Plan: 1. Encephalopathy/prior SDH - related to UTI with underlying dementia - seems to have improved 2. Recurrent UTIs -culture pending empiric abtx per primary 3. Fungal skin rash - family puts on adult diapers prior to attending outpatient dialysis; dialysis staff do not have the resources/space to change if needed while at dialysis - per wound care 4. ESRD -  TTS - missed Tuesday Na low K 4.2 - use 3 K bath today and back on schedule Thursday - lower volume gently -reduce time due to back to back dialysis 5. Hypertension/volume  - on midodrine - has mid dose ordered but doesn't take at outpatient unit/ has furosemide ordered - not clear that it contributes much 6. Anemia  - hgb stable - continue same ESA equivalent 7. Metabolic bone disease -  Continue hectorol/renvela 2 ac 8. Nutrition - renal diet with vits/supplements 9. Noncompliance with dialysis.-- ongoing issue 10. Severe COPD with chronic resp failure -   - Bipap at night - O2 dursing the day - referred lack of current O2 to RN- need to monitor sats continuously if off O2 11. Disp - full code - likely to return home - transport to dialysis via PT[facility] - very poor functional status but family has wishes to continue dialysis  Myriam Jacobson, PA-C [place] [phone] 06/28/2019, 11:41 AM

## 2019-06-29 ENCOUNTER — Inpatient Hospital Stay (HOSPITAL_COMMUNITY): Payer: Medicare Other

## 2019-06-29 LAB — BASIC METABOLIC PANEL
Anion gap: 14 (ref 5–15)
BUN: 12 mg/dL (ref 8–23)
CO2: 25 mmol/L (ref 22–32)
Calcium: 8.8 mg/dL — ABNORMAL LOW (ref 8.9–10.3)
Chloride: 96 mmol/L — ABNORMAL LOW (ref 98–111)
Creatinine, Ser: 3.04 mg/dL — ABNORMAL HIGH (ref 0.44–1.00)
GFR calc Af Amer: 17 mL/min — ABNORMAL LOW (ref >60)
GFR calc non Af Amer: 14 mL/min — ABNORMAL LOW (ref >60)
Glucose, Bld: 96 mg/dL (ref 70–99)
Potassium: 4.1 mmol/L (ref 3.5–5.1)
Sodium: 135 mmol/L (ref 135–145)

## 2019-06-29 LAB — GLUCOSE, CAPILLARY
Glucose-Capillary: 105 mg/dL — ABNORMAL HIGH (ref 70–99)
Glucose-Capillary: 108 mg/dL — ABNORMAL HIGH (ref 70–99)
Glucose-Capillary: 115 mg/dL — ABNORMAL HIGH (ref 70–99)
Glucose-Capillary: 119 mg/dL — ABNORMAL HIGH (ref 70–99)
Glucose-Capillary: 87 mg/dL (ref 70–99)
Glucose-Capillary: 89 mg/dL (ref 70–99)

## 2019-06-29 LAB — CBC
HCT: 33.6 % — ABNORMAL LOW (ref 36.0–46.0)
Hemoglobin: 10.4 g/dL — ABNORMAL LOW (ref 12.0–15.0)
MCH: 28.5 pg (ref 26.0–34.0)
MCHC: 31 g/dL (ref 30.0–36.0)
MCV: 92.1 fL (ref 80.0–100.0)
Platelets: 144 10*3/uL — ABNORMAL LOW (ref 150–400)
RBC: 3.65 MIL/uL — ABNORMAL LOW (ref 3.87–5.11)
RDW: 18.8 % — ABNORMAL HIGH (ref 11.5–15.5)
WBC: 8.4 10*3/uL (ref 4.0–10.5)
nRBC: 0 % (ref 0.0–0.2)

## 2019-06-29 LAB — HEPATITIS B SURFACE ANTIBODY, QUANTITATIVE: Hep B S AB Quant (Post): 442.1 m[IU]/mL (ref >9.9)

## 2019-06-29 LAB — URINE CULTURE: Culture: >=100000 — AB

## 2019-06-29 MED ORDER — COLLAGENASE 250 UNIT/GM EX OINT
TOPICAL_OINTMENT | Freq: Every day | CUTANEOUS | Status: DC
  Filled 2019-06-29: qty 30

## 2019-06-29 MED ORDER — NEPRO/CARBSTEADY PO LIQD
237.0000 mL | Freq: Two times a day (BID) | ORAL | Status: DC
  Administered 2019-06-29 – 2019-06-30 (×2): 237 mL via ORAL

## 2019-06-29 MED ORDER — HEPARIN SODIUM (PORCINE) 1000 UNIT/ML IJ SOLN
INTRAMUSCULAR | Status: AC
  Administered 2019-06-29: 3800 [IU]
  Filled 2019-06-29: qty 4

## 2019-06-29 MED ORDER — JUVEN PO PACK
1.0000 | PACK | Freq: Two times a day (BID) | ORAL | Status: DC
  Administered 2019-06-29 – 2019-06-30 (×2): 1 via ORAL
  Filled 2019-06-29 (×2): qty 1

## 2019-06-29 MED ORDER — DOXERCALCIFEROL 4 MCG/2ML IV SOLN
INTRAVENOUS | Status: AC
  Administered 2019-06-29: 3 ug via INTRAVENOUS
  Filled 2019-06-29: qty 2

## 2019-06-29 MED ORDER — RENA-VITE PO TABS
1.0000 | ORAL_TABLET | Freq: Every day | ORAL | Status: DC
  Administered 2019-06-29: 1 via ORAL
  Filled 2019-06-29: qty 1

## 2019-06-29 NOTE — Progress Notes (Signed)
Initial Nutrition Assessment  DOCUMENTATION CODES:   Obesity unspecified  INTERVENTION:  -Nepro Shake po BID, each supplement provides 425 kcal and 19 grams protein  -Rena-vit daily  -1 packet Juven BID, each packet provides 95 calories, 2.5 grams of protein (collagen), and 9.8 grams of carbohydrate (3 grams sugar); also contains 7 grams of L-arginine and L-glutamine, 300 mg vitamin C, 15 mg vitamin E, 1.2 mcg vitamin B-12, 9.5 mg zinc, 200 mg calcium, and 1.5 g  Calcium Beta-hydroxy-Beta-methylbutyrate to support wound healing   NUTRITION DIAGNOSIS:   Increased nutrient needs related to wound healing, chronic illness(ESRD on HD) as evidenced by estimated needs.    GOAL:   Patient will meet greater than or equal to 90% of their needs    MONITOR:   PO intake, Supplement acceptance, Skin, Weight trends, Labs, I & O's  REASON FOR ASSESSMENT:   Consult Poor PO  ASSESSMENT:   Pt with a PMH significant for ESRD on HD, chronic respiratory failure on home O2, T2DM, hypothyroidism, SDH, collated cholelithiasis, chronic debility wheelchair-bound who was admitted for acute encephalopathy presumably 2/2 UTI and hypoglycemia.  Pt noted to be noncompliant with HD and habitually shortens treatments.   Pt reports poor appetite and states that it is typical for her to eat only 1 meal per day. When asked, pt did not provide specific details about what she typically eats.   Weight history variable, likely due to fluctuations in fluid status.  PO Intake: 0-70% x 4 recorded meals (30% average meal intake)  EDW 80 kg Current wt 81.2 kg Pt receiving HD today.  Labs reviewed. CBGs [phone] Medications reviewed and include: Aranesp, Colace, Hectorol, Lasix, Renvela   NUTRITION - FOCUSED PHYSICAL EXAM:    Most Recent Value  Orbital Region  No depletion  Upper Arm Region  Mild depletion  Thoracic and Lumbar Region  No depletion  Buccal Region  No depletion  Temple Region  No  depletion  Clavicle Bone Region  No depletion  Clavicle and Acromion Bone Region  No depletion  Scapular Bone Region  No depletion  Dorsal Hand  No depletion  Patellar Region  No depletion  Anterior Thigh Region  Mild depletion  Posterior Calf Region  No depletion  Edema (RD Assessment)  None  Hair  Reviewed  Eyes  Reviewed  Mouth  Reviewed  Skin  Reviewed  Nails  Reviewed       Diet Order:   Diet Order            Diet renal/carb modified with fluid restriction Diet-HS Snack? Nothing; Fluid restriction: 1200 mL Fluid; Room service appropriate? Yes; Fluid consistency: Thin  Diet effective now              EDUCATION NEEDS:   No education needs have been identified at this time  Skin:  Skin Assessment: Skin Integrity Issues: Skin Integrity Issues:: Stage II, Unstageable, DTI DTI: R thigh Stage II: bilateral buttocks Unstageable: L thigh  Last BM:  6/2  Height:   Ht Readings from Last 1 Encounters:  06/27/19 5\' 1"  (1.549 m)    Weight:   Wt Readings from Last 1 Encounters:  06/29/19 81.2 kg    BMI:  Body mass index is 33.82 kg/m.  Estimated Nutritional Needs:   Kcal:  1850-2050  Protein:  90-105 grams  Fluid:  102ml + UOP    Larkin Ina, MS, RD, LDN RD pager number and weekend/on-call pager number located in [place].

## 2019-06-29 NOTE — Progress Notes (Signed)
PT Cancellation Note  Patient Details Name: Joan Mosley MRN: [id number] DOB: [birth year]   Cancelled Treatment:    Reason Eval/Treat Not Completed: Patient at procedure or test/unavailable (HD). PT will continue to f/u with pt acutely as available.    [place] 06/29/2019, 1:27 PM

## 2019-06-29 NOTE — Progress Notes (Deleted)
Joan Mosley to be discharged Home  per MD order. Discussed prescriptions and follow up appointments with the patient. Prescriptions given to patient; medication list explained in detail. Patient verbalized understanding.  Skin clean, dry and intact without evidence of skin break down, no evidence of skin tears noted. IV catheter discontinued intact. Site without signs and symptoms of complications. Dressing and pressure applied. Pt denies pain at the site currently. No complaints noted.  Patient free of lines, drains, and wounds.   An After Visit Summary (AVS) was printed and given to the patient. Patient escorted via wheelchair, and discharged home via private auto.  Shela Commons, RN

## 2019-06-29 NOTE — Progress Notes (Signed)
[facility] Progress Note   Dialysis Orders: [facility] [place] 4 hr EDW 80 - left below last tmt 5/30 79.7 2k 2 Ca 400/800 profile 4 keep SBP > 95 400/800 Hectorol 3 Mircera 60 q 2 weeks - last 5/22 Recent labs: Hgb 10.8 Ca/P/iPTH ok  Compliance poor - runs 2 - 3 hr - transport via [facility]  Assessment/Plan: 1. Encephalopathy/prior SDH - related to UTI with underlying dementia - seems to have improved - awake and alert but not eating much 2. Recurrent UTIs - Ecoli - pansensitive  -culture pending empiric abtx per primary 3. Fungal skin rash - family puts on adult diapers prior to attending outpatient dialysis; dialysis staff do not have the resources/space to change if needed while at dialysis - per wound care 4. ESRD -  TTS - HD again today - to get back on schedule K 4.1 -use added K bath  5. Hypertension/volume  - on midodrine - has mid dose ordered but doesn't take at outpatient unit/ has furosemide ordered - not clear that it contributes much- had HD yesterday - net UF not recorded 6. Anemia  - hgb stable - continue same ESA equivalent 7. Metabolic bone disease -  Continue hectorol/renvela 2 ac 8. Nutrition - renal diet with vits/supplements - poor intake 9. Noncompliance with dialysis.-- ongoing issue 10. Severe COPD with chronic resp failure -   - Bipap at night - O2 dursing the day - referred lack of current O2 to RN- need to monitor sats continuously if off O2 11. Disp - full code - likely to return home has [facility] - CM involved - transport to dialysis via [place] - very poor functional status but family has wishes to continue dialysis  Myriam Jacobson, Hershal Coria [place] [phone] 06/29/2019,11:16 AM  LOS: 2 days   Subjective:   No new sisus  Objective Vitals:   06/28/19 2002 06/29/19 0359 06/29/19 0754 06/29/19 0819  BP: 113/60 (!) 107/58 106/64 (!) 113/57  Pulse: (!) 105 94 (!) 101 (!) 102  Resp: 18 18 (!) 26 20  Temp: (!) 97.5 F (36.4 C) 97.9  F (36.6 C) 98 F (36.7 C) 98.4 F (36.9 C)  TempSrc: Oral Oral Oral Oral  SpO2: 95% 92% 92% 95%  Weight:  81.2 kg    Height:       Physical Exam General: wearing O2 breathing easily Heart: RRR Lungs: clear anteriorly Abdomen: soft NT Extremities: tr LE edema Dialysis Access: right IJ [facility]   Additional Objective Labs: Basic Metabolic Panel: Recent Labs  Lab 06/27/19 1230 06/28/19 0642 06/29/19 0322  NA 131* 127* 135  K 4.0 4.2 4.1  CL 90* 88* 96*  CO2 22 21* 25  GLUCOSE 149* 95 96  BUN 39* 45* 12  CREATININE 5.74* 6.23* 3.04*  CALCIUM 8.9 9.0 8.8*   Liver Function Tests: Recent Labs  Lab 06/27/19 1230  AST 33  ALT 14  ALKPHOS 145*  BILITOT 0.7  PROT 5.9*  ALBUMIN 2.1*   No results for input(s): LIPASE, AMYLASE in the last 168 hours. CBC: Recent Labs  Lab 06/27/19 1230 06/28/19 0642 06/29/19 0322  WBC 13.2* 12.9* 8.4  NEUTROABS 10.9*  --   --   HGB 10.9* 10.5* 10.4*  HCT 36.4 34.5* 33.6*  MCV 94.1 90.6 92.1  PLT 158 153 144*   Blood Culture    Component Value Date/Time   SDES BLOOD LEFT ANTECUBITAL 06/27/2019 2029   SPECREQUEST  06/27/2019 2029  BOTTLES DRAWN AEROBIC ONLY Blood Culture results may not be optimal due to an inadequate volume of blood received in culture bottles   CULT  06/27/2019 2029    NO GROWTH 2 DAYS Performed at [facility] [address], [place], [place] [address]    REPTSTATUS PENDING 06/27/2019 2029    Cardiac Enzymes: No results for input(s): CKTOTAL, CKMB, CKMBINDEX, TROPONINI in the last 168 hours. CBG: Recent Labs  Lab 06/28/19 1817 06/28/19 2001 06/28/19 2356 06/29/19 0357 06/29/19 0750  GLUCAP 81 79 103* 87 89   Iron Studies: No results for input(s): IRON, TIBC, TRANSFERRIN, FERRITIN in the last 72 hours. Lab Results  Component Value Date   INR 1.0 06/27/2019   INR 1.0 05/04/2019   INR 1.0 04/15/2019   Studies/Results: DG Chest Port 1 View  Result Date: 06/27/2019 CLINICAL DATA:   74 year old female with weakness. Diarrhea for 2-3 days. EXAM: PORTABLE CHEST 1 VIEW COMPARISON:  Chest radiographs 05/30/2019 and earlier. FINDINGS: Portable AP semi upright view at 1253 hours. Stable right chest dialysis catheter. Stable lung volumes and mediastinal contours. Stable pulmonary vascularity without overt edema. Allowing for portable technique the lungs are clear. Visualized tracheal air column is within normal limits. No acute osseous abnormality identified. IMPRESSION: No acute cardiopulmonary abnormality. Electronically Signed   By: Genevie Ann M.D.   On: 06/27/2019 13:00   Medications: . cefTRIAXone (ROCEPHIN)  IV 1 g (06/28/19 2116)   . atorvastatin  40 mg Oral q AM  . Chlorhexidine Gluconate Cloth  6 each Topical Q0600  . Chlorhexidine Gluconate Cloth  6 each Topical Q0600  . clonazepam  0.25 mg Oral Daily  . clonazePAM  0.5 mg Oral QHS  . collagenase   Topical Daily  . [START ON 07/01/2019] darbepoetin (ARANESP) injection - DIALYSIS  60 mcg Intravenous Q Sat-HD  . docusate sodium  100 mg Oral q morning - 10a  . doxercalciferol  3 mcg Intravenous Q T,Th,Sa-HD  . FLUoxetine  10 mg Oral Daily  . furosemide  40 mg Oral Q M,W,F,Su-1800  . heparin  5,000 Units Subcutaneous Q8H  . levothyroxine  112 mcg Oral QAC breakfast  . midodrine  10 mg Oral Q T,Th,Sa-HD  . montelukast  10 mg Oral QHS  . pantoprazole  40 mg Oral QAC breakfast  . sevelamer carbonate  1,600 mg Oral TID WC  . sevelamer carbonate  800 mg Oral With snacks  . sodium chloride flush  3 mL Intravenous Q12H

## 2019-06-29 NOTE — Progress Notes (Signed)
Progress Note    Joan Mosley  BWI:[id number] DOB: 10-10-1945  DOA: 06/27/2019 PCP: Flossie Buffy, NP    Brief Narrative:     Medical records reviewed and are as summarized below:  Joan Mosley is an 74 y.o. female with a past medical history that includes end-stage renal disease a Tuesday Thursday Saturday dialysis schedule, chronic respiratory failure on home oxygen, type 2 diabetes, hypothyroidism, SDH, collated cholelithiasis, chronic debility wheelchair-bound admitted June 1 for acute encephalopathy presumably related to complicated urinary tract infection and hypoglycemia in setting of metabolic derangement from missing dialysis. Nephrology on board for dialysis. Is lethargic this am with tachypnea and tachycardia  Assessment/Plan:   Principal Problem:   UTI (urinary tract infection) Active Problems:   ESRD on dialysis ([place])   Type II diabetes mellitus with renal manifestations ([place])   Acute encephalopathy   Hyponatremia   Hemodialysis-associated hypotension   Chronic respiratory failure (HCC)   Debility   COPD (chronic obstructive pulmonary disease) (HCC)   Candidal skin infection   Hypothyroidism   Pressure injury of skin   #1.  Acute metabolic encephalopathy.  Likely multifactorial.  Specifically missing dialysis, complicated/recurrent urinary tract infection in setting of diarrhea and decreased oral intake.  Patient is lethargic and tachypneic this am.  She is afebrile but hemodynamically stable. Hyponatremia resolved.   Blood culture no growth to date.  Urine culture with e coli.  -Continue Rocephin -obtain chest xray -Monitor intake and output as she does produce urine -Dialysis per nephrology  #2.  Hypoglycemia.  Likely related to insulin in the setting of missed dialysis.  Home medications include Levemir.  CBGs continue to run low 69-119 but improving.  P.o. intake remains unreliable.  -Continue to hold long-acting insulin -Capillary blood  sugars every 4 hours -Dialysis per nephrology -Intake and output -nutritional consult  #3.  Urinary tract infection.  Upon presentation there was a concern for sepsis due to low temperature and elevated WBCs.  Patient with a history of recurrent/complicated urinary tract infection.  Note she was hospitalized 3 weeks ago and had a complicated UTI at that time.  In the emergency department she was provided with vancomycin, metronidazole, cefepime.  Upon admission this was changed to Rocephin.  Urinalysis concerning for UTI. See #1.  -Continue Rocephin  #4.  Candidal skin infection.  Evaluated by wound care nurse.  WOC opined fungal rash to skin folds as well as upper posterior thighs.  Recommended nystatin powder low air loss mattress Critic-Aid to be applied thighs and buttocks -Monitor -Keep clean and dry -Out of bed as able  5. ESRD/hyponatremia. hyponatremia resolved.  She is a Tuesday Thursday Saturday dialysis patient. Dialysis today per nephrology. Potassium 4.1 this morning.  Creatinine 3.04.  See #1 -Dialysis per nephrology  #6.  Chronic respiratory failure related to COPD.  Is on oxygen at home.  BiPAP at night but she refused last night.  Oxygen saturation level greater than 90% on 4 L.  Chest x-ray without acute ardiopulmonary process. -Continue home oxygen -Continue home meds  #7.  Hypothyroidism.  Home medications include Synthroid.  TSH 3.3. -Continue home meds  #8.  Choledocholithiasis.  Chart review indicates patient underwent an ERCP on May 7.  remains stable at baseline.  #9.  Hemodialysis related hypotension.  Blood pressure low end of normal.   Home medications include midodrine 3 times daily. -Monitor -Continue midodrine  #10.  Debility.  Patient is wheelchair/bedbound.  Family reports she has been "nonweightbearing  for 3 years now".  They have a [place] lift at home lift her out of bed into the chair. -palliative care consult     Family  Communication/Anticipated D/C date and plan/Code Status   DVT prophylaxis: heparin ordered. Code Status: Full Code.  Family Communication:  Disposition Plan: Status is: Inpatient  Remains inpatient appropriate because:IV treatments appropriate due to intensity of illness or inability to take PO and Inpatient level of care appropriate due to severity of illness   Dispo: The patient is from: Home              Anticipated d/c is to: Home              Anticipated d/c date is: 2 days              Patient currently is not medically stable to d/c.          Medical Consultants:    nephrology   Anti-Infectives:    Rocephin 6/1>>  Subjective:   Lying in bed. Denies pain/discomfort. Appears sob and lethargic. Refusing food  Objective:    Vitals:   06/29/19 1135 06/29/19 1235 06/29/19 1238 06/29/19 1240  BP: 102/73 113/70 110/67 112/81  Pulse: 99 (!) 101 98 100  Resp: (!) 23 (!) 21    Temp: 97.9 F (36.6 C) 97.6 F (36.4 C)    TempSrc: Oral Oral    SpO2: 93% 95%    Weight:      Height:        Intake/Output Summary (Last 24 hours) at 06/29/2019 1309 Last data filed at 06/29/2019 1207 Gross per 24 hour  Intake 700 ml  Output 0 ml  Net 700 ml   Filed Weights   06/27/19 2006 06/28/19 1355 06/29/19 0359  Weight: 77.4 kg 80.9 kg 81.2 kg    Exam: General: Slightly pale chronically ill-appearing slightly sob no acute distress : Regular rate and rhythm no murmur gallop or rub no lower extremity edema Respiratory: mild increased work of breathing. respirations slightly shallow with diminished breath sounds throughout.  Hear no wheeze no crackles.  Baseline oxygen in place Abdomen: Nondistended soft positive bowel sounds throughout mild tenderness to lower quadrants palpation no guarding or rebounding Musculoskeletal: Joints without swelling/erythema Skin: Rash bilateral posterior thighs and groin area.  No drainage no odor Neuro lethargic and oriented to self and place.   Speech clear facial symmetry bilateral grip 5 out of 5 obvious short-term memory deficits  Data Reviewed:   I have personally reviewed following labs and imaging studies:  Labs: Labs show the following:   Basic Metabolic Panel: Recent Labs  Lab 06/27/19 1230 06/27/19 1230 06/28/19 0642 06/29/19 0322  NA 131*  --  127* 135  K 4.0   < > 4.2 4.1  CL 90*  --  88* 96*  CO2 22  --  21* 25  GLUCOSE 149*  --  95 96  BUN 39*  --  45* 12  CREATININE 5.74*  --  6.23* 3.04*  CALCIUM 8.9  --  9.0 8.8*   < > = values in this interval not displayed.   GFR Estimated Creatinine Clearance: 15.2 mL/min (A) (by C-G formula based on SCr of 3.04 mg/dL (H)). Liver Function Tests: Recent Labs  Lab 06/27/19 1230  AST 33  ALT 14  ALKPHOS 145*  BILITOT 0.7  PROT 5.9*  ALBUMIN 2.1*   No results for input(s): LIPASE, AMYLASE in the last 168 hours. No results for input(s): AMMONIA  in the last 168 hours. Coagulation profile Recent Labs  Lab 06/27/19 1230  INR 1.0    CBC: Recent Labs  Lab 06/27/19 1230 06/28/19 0642 06/29/19 0322  WBC 13.2* 12.9* 8.4  NEUTROABS 10.9*  --   --   HGB 10.9* 10.5* 10.4*  HCT 36.4 34.5* 33.6*  MCV 94.1 90.6 92.1  PLT 158 153 144*   Cardiac Enzymes: No results for input(s): CKTOTAL, CKMB, CKMBINDEX, TROPONINI in the last 168 hours. BNP (last 3 results) Recent Labs    04/10/19 1418  PROBNP 81.0   CBG: Recent Labs  Lab 06/28/19 2001 06/28/19 2356 06/29/19 0357 06/29/19 0750 06/29/19 1119  GLUCAP 79 103* 87 89 119*   D-Dimer: No results for input(s): DDIMER in the last 72 hours. Hgb A1c: No results for input(s): HGBA1C in the last 72 hours. Lipid Profile: No results for input(s): CHOL, HDL, LDLCALC, TRIG, CHOLHDL, LDLDIRECT in the last 72 hours. Thyroid function studies: Recent Labs    06/28/19 1135  TSH 3.398   Anemia work up: No results for input(s): VITAMINB12, FOLATE, FERRITIN, TIBC, IRON, RETICCTPCT in the last 72  hours. Sepsis Labs: Recent Labs  Lab 06/27/19 1230 06/27/19 2029 06/28/19 0642 06/29/19 0322  WBC 13.2*  --  12.9* 8.4  LATICACIDVEN 1.7 1.1  --   --     Microbiology Recent Results (from the past 240 hour(s))  Blood culture (routine x 2)     Status: None (Preliminary result)   Collection Time: 06/27/19 12:30 PM   Specimen: BLOOD  Result Value Ref Range Status   Specimen Description BLOOD RIGHT ANTECUBITAL  Final   Special Requests   Final    BOTTLES DRAWN AEROBIC AND ANAEROBIC Blood Culture results may not be optimal due to an inadequate volume of blood received in culture bottles   Culture   Final    NO GROWTH 2 DAYS Performed at [facility], [place] [address], [place], [place] [address]    Report Status PENDING  Incomplete  Urine culture     Status: Abnormal   Collection Time: 06/27/19  2:01 PM   Specimen: In/Out Cath Urine  Result Value Ref Range Status   Specimen Description IN/OUT CATH URINE  Final   Special Requests   Final    NONE Performed at [facility], [place] [address], [place], [place] [address]    Culture >=100,000 COLONIES/mL ESCHERICHIA COLI (A)  Final   Report Status 06/29/2019 FINAL  Final   Organism ID, Bacteria ESCHERICHIA COLI (A)  Final      Susceptibility   Escherichia coli - MIC*    AMPICILLIN 4 SENSITIVE Sensitive     CEFAZOLIN <=4 SENSITIVE Sensitive     CEFTRIAXONE <=1 SENSITIVE Sensitive     CIPROFLOXACIN <=0.25 SENSITIVE Sensitive     GENTAMICIN <=1 SENSITIVE Sensitive     IMIPENEM <=0.25 SENSITIVE Sensitive     NITROFURANTOIN <=16 SENSITIVE Sensitive     TRIMETH/SULFA <=20 SENSITIVE Sensitive     AMPICILLIN/SULBACTAM <=2 SENSITIVE Sensitive     PIP/TAZO <=4 SENSITIVE Sensitive     * >=100,000 COLONIES/mL ESCHERICHIA COLI  SARS Coronavirus 2 by RT PCR (hospital order, performed in [place] [facility] hospital lab) Nasopharyngeal Nasopharyngeal Swab     Status: None   Collection Time: 06/27/19  3:39 PM   Specimen: Nasopharyngeal Swab   Result Value Ref Range Status   SARS Coronavirus 2 NEGATIVE NEGATIVE Final    Comment: (NOTE) SARS-CoV-2 target nucleic acids are NOT DETECTED.  The SARS-CoV-2 RNA is generally detectable in upper and lower respiratory specimens during the acute phase of infection. The lowest concentration of SARS-CoV-2 viral copies this assay can detect is 250 copies / mL. A negative result does not preclude SARS-CoV-2 infection and should not be used as the sole basis for treatment or other patient management decisions.  A negative result may occur with improper specimen collection / handling, submission of specimen other than nasopharyngeal swab, presence of viral mutation(s) within the areas targeted by this assay, and inadequate number of viral copies (<250 copies / mL). A negative result must be combined with clinical observations, patient history, and epidemiological information. Fact Sheet for Patients:   StrictlyIdeas.no Fact Sheet for Healthcare Providers: BankingDealers.co.za This test is not yet approved or cleared  by the [place] FDA and has been authorized for detection and/or diagnosis of SARS-CoV-2 by FDA under an Emergency Use Authorization (EUA).  This EUA will remain in effect (meaning this test can be used) for the duration of the COVID-19 declaration under Section 564(b)(1) of the Act, 21 U.S.C. section 360bbb-3(b)(1), unless the authorization is terminated or revoked sooner. Performed at [facility], [place] [address] [place], [place] [address]   Blood culture (routine x 2)     Status: None (Preliminary result)   Collection Time: 06/27/19  8:29 PM   Specimen: BLOOD  Result Value Ref Range Status   Specimen Description BLOOD LEFT ANTECUBITAL  Final   Special Requests   Final    BOTTLES DRAWN AEROBIC ONLY Blood Culture results may not be optimal due to an inadequate volume of blood received in culture bottles   Culture    Final    NO GROWTH 2 DAYS Performed at [facility], [place] [address] [place], [place] [address]    Report Status PENDING  Incomplete    Procedures and diagnostic studies:  DG CHEST PORT 1 VIEW  Result Date: 06/29/2019 CLINICAL DATA:  Shortness of breath. EXAM: PORTABLE CHEST 1 VIEW COMPARISON:  06/27/2019 FINDINGS: Double lumen central venous catheter is in position with the tip in the right atrium, unchanged. Overall heart size and pulmonary vascularity are normal. Chronic accentuation of the interstitial markings. No significant bone abnormality. No acute infiltrates and effusions. IMPRESSION: No acute cardiopulmonary disease. Chronic interstitial lung disease. Electronically Signed   By: [name] M.D.   On: 06/29/2019 11:19    Medications:    atorvastatin  40 mg Oral q AM   Chlorhexidine Gluconate Cloth  6 each Topical Q0600   Chlorhexidine Gluconate Cloth  6 each Topical Q0600   clonazepam  0.25 mg Oral Daily   clonazePAM  0.5 mg Oral QHS   collagenase   Topical Daily   [START ON 07/01/2019] darbepoetin (ARANESP) injection - DIALYSIS  60 mcg Intravenous Q Sat-HD   docusate sodium  100 mg Oral q morning - 10a   doxercalciferol  3 mcg Intravenous Q T,Th,Sa-HD   FLUoxetine  10 mg Oral Daily   furosemide  40 mg Oral Q M,W,F,Su-1800   heparin  5,000 Units Subcutaneous Q8H   levothyroxine  112 mcg Oral QAC breakfast   midodrine  10 mg Oral Q T,Th,Sa-HD   montelukast  10 mg Oral QHS   pantoprazole  40 mg Oral QAC breakfast   sevelamer carbonate  1,600 mg Oral TID WC   sevelamer carbonate  800 mg Oral With snacks   sodium chloride flush  3 mL Intravenous Q12H   Continuous Infusions:  cefTRIAXone (ROCEPHIN)  IV 1 g (06/28/19 2116)     LOS: 2 days   Radene Gunning NP  [facility]   How to contact the [facility] Attending or Consulting provider [place] or covering provider during after hours [place], for this patient?  1. Check the care team in [facility]  and look for a) attending/consulting [facility] provider listed and b) the [facility] team listed 2. Log into [email or web address] and use [place]'s universal password to access. If you do not have the password, please contact the hospital operator. 3. Locate the [facility]: [facility] Rhonda W provider you are looking for under [facility] and page to a number that you can be directly reached. 4. If you still have difficulty reaching the provider, please page the [facility] (Director on Call) for the Hospitalists listed on amion for assistance.  06/29/2019, 1:09 PM

## 2019-06-29 NOTE — Consult Note (Signed)
[facility] Nurse Consult Note: Patient receiving care in [facility] 5M10.  Assisted with turning to view wound by NT. Reason for Consult: unstageable left hip wound Wound type: unstageable PI Pressure Injury POA: Yes Measurement: 3 cm x 3 cm x unknown depth Wound bed: grey/yellow Drainage (amount, consistency, odor) none Periwound: intact Dressing procedure/placement/frequency: santyl and saline moistened gauze daily. Monitor the wound area(s) for worsening of condition such as: Signs/symptoms of infection,  Increase in size,  Development of or worsening of odor, Development of pain, or increased pain at the affected locations.  Notify the medical team if any of these develop.  Thank you for the consult.  [place] nurse will not follow at this time.  Please re-consult the [place] team if needed.  Val Riles, RN, MSN, CWOCN, CNS-BC, pager [phone]

## 2019-06-30 ENCOUNTER — Telehealth: Payer: Medicare Other | Admitting: Nurse Practitioner

## 2019-06-30 DIAGNOSIS — R319 Hematuria, unspecified: Secondary | ICD-10-CM

## 2019-06-30 DIAGNOSIS — N390 Urinary tract infection, site not specified: Secondary | ICD-10-CM

## 2019-06-30 DIAGNOSIS — B372 Candidiasis of skin and nail: Secondary | ICD-10-CM

## 2019-06-30 DIAGNOSIS — Z992 Dependence on renal dialysis: Secondary | ICD-10-CM

## 2019-06-30 DIAGNOSIS — N186 End stage renal disease: Secondary | ICD-10-CM

## 2019-06-30 LAB — CBC
HCT: 38.4 % (ref 36.0–46.0)
Hemoglobin: 11.3 g/dL — ABNORMAL LOW (ref 12.0–15.0)
MCH: 27.9 pg (ref 26.0–34.0)
MCHC: 29.4 g/dL — ABNORMAL LOW (ref 30.0–36.0)
MCV: 94.8 fL (ref 80.0–100.0)
Platelets: 184 10*3/uL (ref 150–400)
RBC: 4.05 MIL/uL (ref 3.87–5.11)
RDW: 19.3 % — ABNORMAL HIGH (ref 11.5–15.5)
WBC: 9.1 10*3/uL (ref 4.0–10.5)
nRBC: 0 % (ref 0.0–0.2)

## 2019-06-30 LAB — BASIC METABOLIC PANEL
Anion gap: 13 (ref 5–15)
BUN: 12 mg/dL (ref 8–23)
CO2: 25 mmol/L (ref 22–32)
Calcium: 9.8 mg/dL (ref 8.9–10.3)
Chloride: 100 mmol/L (ref 98–111)
Creatinine, Ser: 2.1 mg/dL — ABNORMAL HIGH (ref 0.44–1.00)
GFR calc Af Amer: 26 mL/min — ABNORMAL LOW (ref >60)
GFR calc non Af Amer: 22 mL/min — ABNORMAL LOW (ref >60)
Glucose, Bld: 97 mg/dL (ref 70–99)
Potassium: 4 mmol/L (ref 3.5–5.1)
Sodium: 138 mmol/L (ref 135–145)

## 2019-06-30 LAB — GLUCOSE, CAPILLARY
Glucose-Capillary: 110 mg/dL — ABNORMAL HIGH (ref 70–99)
Glucose-Capillary: 88 mg/dL (ref 70–99)
Glucose-Capillary: 77 mg/dL (ref 70–99)

## 2019-06-30 MED ORDER — COLLAGENASE 250 UNIT/GM EX OINT: TOPICAL_OINTMENT | Freq: Every day | CUTANEOUS | 0 refills | Status: AC

## 2019-06-30 MED ORDER — CEPHALEXIN 250 MG PO CAPS
250.0000 mg | ORAL_CAPSULE | Freq: Two times a day (BID) | ORAL | Status: DC
  Administered 2019-06-30: 250 mg via ORAL
  Filled 2019-06-30 (×2): qty 1

## 2019-06-30 MED ORDER — CEPHALEXIN 250 MG PO CAPS: 250.0000 mg | ORAL_CAPSULE | Freq: Two times a day (BID) | ORAL | 0 refills | Status: DC

## 2019-06-30 NOTE — Progress Notes (Signed)
Joan Mosley to be discharged Home per MD order. Discussed prescriptions and follow up appointments with the patient. Prescriptions given to patient; medication list explained in detail. Patient verbalized understanding.  Skin clean, dry and intact without evidence of skin break down, no evidence of skin tears noted. IV catheter discontinued intact. Site without signs and symptoms of complications. Dressing and pressure applied. Pt denies pain at the site currently. No complaints noted.  Patient free of lines, drains, and wounds.   An After Visit Summary (AVS) was printed and given to the patient. Patient escorted via stretcher via [place], RN

## 2019-06-30 NOTE — Evaluation (Signed)
Physical Therapy Evaluation Patient Details Name: Joan Mosley MRN: [id number] DOB: 1945-08-16 Today's Date: 06/30/2019   History of Present Illness  74 y.o. female admitted on 06/27/19 for UTI and diarrhea. Previous stay on 05/31/19-06/07/19 for acute encephaolpathy. Chest x-ray clear 06/27/19, 06/29/19.  PMH of ESRD on HD, COPD, oxygen dependent, DM type II, hypothyroidism, SDH, choledocholithiasis, and chronic debility.  Clinical Impression  Pt presents with an overall decrease in functional mobility, decreased cardiopulmonary endurance and generalized weakess secondary to above. PTA, pt lives with son and daughter at home, uses w/c with hoyer lift transfers at baseline. Today, pt able to sit EOB total (A)+2 and complete trunk righting and LE therex. Pt required consistent verbal and tactile cues and repeated directions for all mobility. No family or caregiver present to discuss d/c recommendations, based on mobility today and PMH would recommend SNF.  Pt would benefit from continued acute PT services to maximize functional mobility and independence prior to d/c to next venue of care.   Pt 87% o2 5L by [place]  in bed  Pt 92-94% o2 5L by [place] sitting EOB    Follow Up Recommendations SNF    Equipment Recommendations  None recommended by PT    Recommendations for Other Services OT consult     Precautions / Restrictions Precautions Precautions: Fall Precaution Comments: watch o2 Restrictions Weight Bearing Restrictions: No Other Position/Activity Restrictions: Prior level of function w/c level at baseline, has not WB in 3 years per prior history.      Mobility  Bed Mobility Overal bed mobility: Needs Assistance Bed Mobility: Supine to Sit;Sit to Supine     Supine to sit: +2 for physical assistance;Total assist Sit to supine: Total assist;+2 for physical assistance   General bed mobility comments: Required total (A) +2  to sit EOB for bilateral LE assistance and elevation of trunk.  Required total(A)+1 to scoot EOB.  Transfers                 General transfer comment: unable  Ambulation/Gait             General Gait Details: unable  Stairs            Wheelchair Mobility    Modified Rankin (Stroke Patients Only)       Balance Overall balance assessment: Needs assistance Sitting-balance support: Bilateral upper extremity supported;Feet supported Sitting balance-Leahy Scale: Poor Sitting balance - Comments: Can sit unsupported briefly but then leans on R UE and leans heavily to R side. Pt was able to push through RUE to left side to right posture to midline, but immediatly resorted back to right lean after a few seconds. Could sit briefly unsupported with close guard and verbal and tactile cues. Postural control: Right lateral lean   Standing balance-Leahy Scale: Zero Standing balance comment: Pt uses WC at baseline                             Pertinent Vitals/Pain Pain Assessment: Faces Faces Pain Scale: Hurts little more Pain Location: Pt reports no pain, but with movement groans Pain Descriptors / Indicators: Moaning;Grimacing;Guarding Pain Intervention(s): Limited activity within patient's tolerance;Monitored during session    Home Living Family/patient expects to be discharged to:: Private residence Living Arrangements: Children Available Help at Discharge: Family;Available 24 hours/day Type of Home: House Home Access: Ramped entrance     Home Layout: One level Home Equipment: Walker - 2 wheels;Cane - single point;Wheelchair -  power;Electric scooter;Hospital bed;Shower seat Additional Comments: hoyer lift    Prior Function Level of Independence: Needs assistance   Gait / Transfers Assistance Needed: pt reports she has been using a w/c, per prior history uses hoyer lift to transfer to w/c           Hand Dominance   Dominant Hand: Right    Extremity/Trunk Assessment   Upper Extremity Assessment Upper  Extremity Assessment: Difficult to assess due to impaired cognition;RUE deficits/detail;LUE deficits/detail RUE Deficits / Details: Able to flex B shoulders halfway throguh motion with increased prompting LUE Deficits / Details: Able to flex B shoulders halfway throguh motion with increased prompting    Lower Extremity Assessment Lower Extremity Assessment: RLE deficits/detail;LLE deficits/detail RLE Deficits / Details: Able to complete knee extension 2+5 BLE and hip abd/add 2/5 for bed mobility. NWB and uses w.c at baseline. LLE Deficits / Details: Able to complete knee extension 2+5 BLE and hip abd/add 2/5 for bed mobility. NWB and uses w.c at baseline.    Cervical / Trunk Assessment Cervical / Trunk Assessment: Kyphotic Cervical / Trunk Exceptions: obesity, weakness  Communication   Communication: HOH  Cognition Arousal/Alertness: Lethargic Behavior During Therapy: Flat affect Overall Cognitive Status: No family/caregiver present to determine baseline cognitive functioning Area of Impairment: Memory;Following commands;Awareness;Problem solving;Attention                 Orientation Level: Disoriented to;Time;Situation Current Attention Level: Sustained Memory: Decreased short-term memory Following Commands: Follows one step commands with increased time;Follows one step commands inconsistently     Problem Solving: Slow processing;Decreased initiation;Difficulty sequencing;Requires verbal cues;Requires tactile cues General Comments: pt flat during session, could not decide whether she wanted to go by Posey Pronto or Mrs. Ade during session. Pt HOH and had to repeat directions several times for completion of exercises.      General Comments General comments (skin integrity, edema, etc.): Pt flat and lethargic during therapy. Required continous verbal and tactile cues with repeated directions for completion of movement.    Exercises Total Joint Exercises Long Arc Quad: AROM;5  reps;Both;Seated Other Exercises Other Exercises: Trunk righting (using abdominals and R UE to push to left side) Other Exercises: EOB sit ~62min   Assessment/Plan    PT Assessment Patient needs continued PT services  PT Problem List Decreased strength;Decreased range of motion;Decreased activity tolerance;Decreased balance;Decreased mobility;Decreased coordination;Decreased cognition;Decreased knowledge of use of DME;Decreased safety awareness;Cardiopulmonary status limiting activity;Obesity;Pain       PT Treatment Interventions DME instruction;Functional mobility training;Therapeutic activities;Therapeutic exercise;Balance training;Neuromuscular re-education;Patient/family education;Gait training    PT Goals (Current goals can be found in the Care Plan section)  Acute Rehab PT Goals Patient Stated Goal: to go home PT Goal Formulation: With patient Time For Goal Achievement: 07/14/19 Potential to Achieve Goals: Fair    Frequency Min 2X/week   Barriers to discharge   Pt requires two person assistance, has two person assistance at home per prior history and uses hoyer lift at baseline.    Co-evaluation               AM-PAC PT "6 Clicks" Mobility  Outcome Measure Help needed turning from your back to your side while in a flat bed without using bedrails?: Total Help needed moving from lying on your back to sitting on the side of a flat bed without using bedrails?: Total Help needed moving to and from a bed to a chair (including a wheelchair)?: Total Help needed standing up from a chair using your arms (e.g.,  wheelchair or bedside chair)?: Total Help needed to walk in hospital room?: Total Help needed climbing 3-5 steps with a railing? : Total 6 Click Score: 6    End of Session Equipment Utilized During Treatment: Oxygen Activity Tolerance: Patient limited by fatigue Patient left: in bed;with call bell/phone within reach;with bed alarm set Nurse Communication: Mobility  status PT Visit Diagnosis: Muscle weakness (generalized) (M62.81);Other abnormalities of gait and mobility (R26.89)    Time: 8887-5797 PT Time Calculation (min) (ACUTE ONLY): 22 min   Charges:   PT Evaluation $PT Eval Moderate Complexity: 1 Mod          Vitali Seibert SPT 06/30/2019   Rolland Porter 06/30/2019, 11:14 AM

## 2019-06-30 NOTE — Progress Notes (Signed)
Inpatient Diabetes Program Recommendations  AACE/ADA: New Consensus Statement on Inpatient Glycemic Control (2015)  Target Ranges:  Prepandial:   less than 140 mg/dL      Peak postprandial:   less than 180 mg/dL (1-2 hours)      Critically ill patients:  140 - 180 mg/dL   Lab Results  Component Value Date   GLUCAP 88 06/30/2019   HGBA1C 5.5 04/28/2019    Review of Glycemic Control Results for MADOLYN, ACKROYD (MRN [id number]) as of 06/30/2019 09:30  Ref. Range 06/29/2019 20:07 06/29/2019 23:51 06/30/2019 04:08 06/30/2019 07:08  Glucose-Capillary Latest Ref Range: 70 - 99 mg/dL 115 (H) 105 (H) 110 (H) 88   Diabetes history: Type 2 DM Outpatient Diabetes medications: Levemir 10 units QHS Current orders for Inpatient glycemic control: CBGs Q4H  Inpatient Diabetes Program Recommendations:    Patient experiencing hypoglycemia on admission.   Given current glucose trends, lack of insulin need while inpatient,  Age, chronic debility, recent A1Cs over last 6 months <6%, and renal status would recommend discontinuing Levemir at discharge.  Secure chat sent to MD.   Thanks, Bronson Curb, MSN, RNC-OB Diabetes Coordinator [phone] (8a-5p)

## 2019-06-30 NOTE — Progress Notes (Signed)
Subjective:  No cos , back to baseline MS, hd sat on schedule   Objective Vital signs in last 24 hours: Vitals:   06/29/19 1608 06/29/19 2011 06/30/19 0416 06/30/19 0835  BP: 114/60 113/66 110/73 106/71  Pulse: 99 83 99 96  Resp: 20 20 18 16   Temp: (!) 97.5 F (36.4 C) 99 F (37.2 C) 98.7 F (37.1 C) 99.3 F (37.4 C)  TempSrc: Oral Oral Oral Oral  SpO2: 95% 100% 97% 95%  Weight:      Height:       Weight change:   Physical Exam: General: alert  NAD , HOH  And normal ms  Heart: RRR no mr g Lungs: CTA  anter  nonlabor Abdomen: Obese, ND, NT, some abd wall edema  Extremities: trace pedal and thigh  edema  Dialysis  Access = R IJ PC   Dialysis Orders: [facility] 4 hr EDW 80 - left below last tmt 5/30 79.7 2k 2 Ca 400/800 profile 4 keep SBP > 95 400/800 Hectorol 3 Mircera 60 q 2 weeks - last 5/22 Recent labs: Hgb 10.8 Ca/P/iPTH ok  Compliance poor - runs 2 - 3 hr - transport via [facility]  Problem/Plan: 1. Encephalopathy/prior SDH-MS  Appears back to baseline this am , AMS related to UTI /underlying dementia -  2. Recurrent UTIs - Ecoli - was pan sensitive  Antibiotics per admit 3. Fungal skin rash- family puts on adult diapers prior to attending outpatient dialysis; OP  dialysis staff do not have the resources/space to change if needed while at dialysis - per wound care 4. ESRD- TTS - HD again today - to get back on schedule K 4.1 -use added K bath  5. Hypertension/volume- on midodrine - has mid dose ordered but doesn't take at outpatient unit/ has furosemide ordered - not clear that it contributes much- last hd net 1,200 UF   No changes in edw   6. Anemia- hgb 11.3 stable - continue same ESA equivalent 7. Metabolic bone disease- Continue hectorol/renvela2 ac 8. Nutrition- renal diet with vits/supplements - poor intake 9. Noncompliance with dialysis.-- ongoing issue 10. Severe COPD with chronic resp failure -- Bipap at night - O2 during the day -  11. Disp - full  code - likely to return home has [facility] - CM involved - transport to dialysis via [place] - family has wishes to continue dialysis   Ernest Haber, PA-C [place] [phone] 06/30/2019,11:48 AM  LOS: 3 days   Labs: Basic Metabolic Panel: Recent Labs  Lab 06/28/19 0642 06/29/19 0322 06/30/19 0556  NA 127* 135 138  K 4.2 4.1 4.0  CL 88* 96* 100  CO2 21* 25 25  GLUCOSE 95 96 97  BUN 45* 12 12  CREATININE 6.23* 3.04* 2.10*  CALCIUM 9.0 8.8* 9.8   Liver Function Tests: Recent Labs  Lab 06/27/19 1230  AST 33  ALT 14  ALKPHOS 145*  BILITOT 0.7  PROT 5.9*  ALBUMIN 2.1*   No results for input(s): LIPASE, AMYLASE in the last 168 hours. No results for input(s): AMMONIA in the last 168 hours. CBC: Recent Labs  Lab 06/27/19 1230 06/27/19 1230 06/28/19 0642 06/29/19 0322 06/30/19 0556  WBC 13.2*   < > 12.9* 8.4 9.1  NEUTROABS 10.9*  --   --   --   --   HGB 10.9*   < > 10.5* 10.4* 11.3*  HCT 36.4   < > 34.5* 33.6* 38.4  MCV 94.1  --  90.6 92.1 94.8  PLT 158   < > 153 144* 184   < > = values in this interval not displayed.   Cardiac Enzymes: No results for input(s): CKTOTAL, CKMB, CKMBINDEX, TROPONINI in the last 168 hours. CBG: Recent Labs  Lab 06/29/19 2007 06/29/19 2351 06/30/19 0408 06/30/19 0708 06/30/19 1133  GLUCAP 115* 105* 110* 88 77    Studies/Results: DG CHEST PORT 1 VIEW  Result Date: 06/29/2019 CLINICAL DATA:  Shortness of breath. EXAM: PORTABLE CHEST 1 VIEW COMPARISON:  06/27/2019 FINDINGS: Double lumen central venous catheter is in position with the tip in the right atrium, unchanged. Overall heart size and pulmonary vascularity are normal. Chronic accentuation of the interstitial markings. No significant bone abnormality. No acute infiltrates and effusions. IMPRESSION: No acute cardiopulmonary disease. Chronic interstitial lung disease. Electronically Signed   By: [name] M.D.   On: 06/29/2019 11:19   Medications:  .  atorvastatin  40 mg Oral q AM  . cephALEXin  250 mg Oral Q12H  . Chlorhexidine Gluconate Cloth  6 each Topical Q0600  . Chlorhexidine Gluconate Cloth  6 each Topical Q0600  . clonazepam  0.25 mg Oral Daily  . clonazePAM  0.5 mg Oral QHS  . collagenase   Topical Daily  . [START ON 07/01/2019] darbepoetin (ARANESP) injection - DIALYSIS  60 mcg Intravenous Q Sat-HD  . docusate sodium  100 mg Oral q morning - 10a  . doxercalciferol  3 mcg Intravenous Q T,Th,Sa-HD  . feeding supplement (NEPRO CARB STEADY)  237 mL Oral BID BM  . FLUoxetine  10 mg Oral Daily  . furosemide  40 mg Oral Q M,W,F,Su-1800  . heparin  5,000 Units Subcutaneous Q8H  . levothyroxine  112 mcg Oral QAC breakfast  . midodrine  10 mg Oral Q T,Th,Sa-HD  . montelukast  10 mg Oral QHS  . multivitamin  1 tablet Oral QHS  . nutrition supplement (JUVEN)  1 packet Oral BID BM  . pantoprazole  40 mg Oral QAC breakfast  . sevelamer carbonate  1,600 mg Oral TID WC  . sevelamer carbonate  800 mg Oral With snacks  . sodium chloride flush  3 mL Intravenous Q12H

## 2019-06-30 NOTE — TOC Transition Note (Signed)
Transition of Care [facility]) - CM/SW Discharge Note   Patient Details  Name: Joan Mosley MRN: [id number] Date of Birth: [birth year]  Transition of Care [facility]) CM/SW Contact:  Bartholomew Crews, RN Phone Number: [phone] 06/30/2019, 12:47 PM   Clinical Narrative:     Patient to transition home today. Spoke with patient's son, [name], who verified that hospital bed with overlay mattress had been delivered this morning. Home address verified. MD notified - DC order placed, and [place] orders placed for resumption of services. Cindie at [facility] notified of transition home. Renal navigator notified. PTAR arranged. No further TOC needs identified.    Final next level of care: Home w Home Health Services Barriers to Discharge: No Barriers Identified   Patient Goals and CMS Choice   CMS Medicare.gov Compare Post Acute Care list provided to:: Patient Represenative (must comment) Choice offered to / list presented to : [facility] POA / Guardian, Adult Children  Discharge Placement                Patient to be transferred to facility by: [place] Name of family member notified: [name] Patient and family notified of of transfer: 06/30/19  Discharge Plan and Services In-house Referral: Clinical Social Work Discharge Planning Services: CM Consult Post Acute Care Choice: Home Health          DME Arranged: Hospital bed DME Agency: [employer] Date DME Agency Contacted: 06/28/19 Time DME Agency Contacted: [id number] Representative spoke with at DME Agency: [place]: PT, RN [place] Agency: [place] Date [place]: 06/30/19 Time [place]: [id number] Representative spoke with at [place]: [place] Determinants of Health ([place]) Interventions     Readmission Risk Interventions Readmission Risk Prevention Plan 05/31/2019 10/04/2018  Transportation Screening Complete Complete  Medication Review Press photographer) Complete Complete  PCP or Specialist appointment within 3-5  days of discharge Complete Complete  HRI or [place] Complete Complete  SW Recovery Care/Counseling Consult Complete Complete  Palliative Care Screening Not Applicable Complete  [place] Patient Refused Patient Refused

## 2019-07-01 NOTE — Discharge Summary (Signed)
Physician Discharge Summary  Joan Mosley KDT:[id number] DOB: Dec 30, 1945 DOA: 06/27/2019  PCP: Flossie Buffy, NP  Admit date: 06/27/2019 Discharge date: 07/01/2019  Admitted From:  Discharge disposition: Home   Recommendations for Outpatient Follow-Up:   1. *Continue palliative care talks with patient as patient's overall functional status is poor 2. Insulin stopped due to hypoglycemic episodes   Discharge Diagnosis:   Principal Problem:   UTI (urinary tract infection) Active Problems:   Hemodialysis-associated hypotension   Hypothyroidism   ESRD on dialysis ([place])   Chronic respiratory failure (HCC)   Debility   Type II diabetes mellitus with renal manifestations (HCC)   COPD (chronic obstructive pulmonary disease) (HCC)   Acute encephalopathy   Pressure injury of skin   Hyponatremia   Candidal skin infection    Discharge Condition: Improved.  Diet recommendation: Renal carb mod  Wound care: None.  Code status: Full.   History of Present Illness:   Joan Mosley is a 74 y.o. female with medical history significant of ESRD on HD, COPD, oxygen dependent, DM type II, hypothyroidism, SDH, choledocholithiasis, and chronic debility after waking up this morning around 6 AM and had a episode of diarrhea.  History is mostly obtained from the patient's son and daughter over the phone who help care for her.  The patient has been dealing with some wounds of her groin and upper legs that they had tried to get her primary care provider to call in some antibiotics to treat, but were unable to get a quick appointment.  Son relates it to the depends that that put on her during dialysis.  Patient reportedly has been scratching the areas when family's not looking.  After her episode of diarrhea this morning they had talked to the hemodialysis center who recommended the get her to the hospital to be evaluated.  At home they note that she had only been eating 2 small meals  daily.  Initial blood sugar was noted to be 38 and she was given 30 g of oral glucose with repeat CBG 181.  Patient normally on Levemir 10 units nightly.  She states that she has been vomiting and short of breath over the last several days, but family denies theses reports. She has also  Patient had just been hospitalized from 5/4 -5/12 with acute metabolic encephalopathy secondary to be UTI, subdural hematoma, and choledocholithiasis.  During the hospitalization patient was placed on empiric antibiotics and underwent ERCP with sphincterectomy for removal of a common bile duct stone.  Subdural hematoma was evaluated by neurosurgery, but no surgical intervention was recommended.  Family notes since last hospitalization patient has been little altered at times.   Hospital Course by Problem:   #1. Acute metabolic encephalopathy. Likely multifactorial. Specifically missing dialysis, complicated/recurrent urinary tract infection in setting of diarrhea and decreased oral intake.Patient is lethargic and tachypneic this am. She is afebrile but hemodynamically stable. Hyponatremia resolved.  Blood culture no growth to date. Urine culture with e coli.  -Patient back to her baseline  #2. Hypoglycemia. Likely related to insulin in the setting of missed dialysis. Home medications include Levemir. CBGs continue to run low 69-119 but improving. P.o. intake remains unreliable.  -have stopped her long-acting insulin  #3. Urinary tract infection. Upon presentation there was a concern for sepsis due to low temperature and elevated WBCs. Patient with a history of recurrent/complicated urinary tract infection. Note she was hospitalized 3 weeks ago and had a complicated UTI at that  time. In the emergency department she was provided with vancomycin, metronidazole, cefepime. Upon admission this was changed to Rocephin. Urinalysis concerning for UTI. See #1.  -Rocephin changed to oral antibiotics which  patient will finish suspect this will be an ongoing issue as patient does wear depends  #4. Candidal skin infection. Evaluated by wound care nurse. WOC opined fungal rash to skin folds as well as upper posterior thighs. Recommended nystatin powder low air loss mattress Critic-Aid to be applied thighs and buttocks   5.ESRD/hyponatremia. hyponatremia resolved. She is a Tuesday Thursday Saturday dialysis patient.  -Dialysis per nephrology  #6. Chronic respiratory failure related to COPD. Is on oxygen at home. BiPAP at night but she refused last night. Oxygen saturation level greater than 90% on 4 L. Chest x-ray without acute ardiopulmonary process. -Continue home oxygen -Continue home meds  #7. Hypothyroidism. Home medications include Synthroid.TSH 3.3. -Continue home meds  #8. Choledocholithiasis.Chart review indicates patient underwent an ERCP on May 7. remains stable at baseline.  #9. Hemodialysis related hypotension. Blood pressure low end of normal.  Home medications include midodrine 3 times daily. -Monitor -Continue midodrine  #10. Debility. Patient is wheelchair/bedbound. Family reports she has been "nonweightbearing for 3 years now". They have a Hoyer lift at home lift her out of bed into the chair. Poor functional status      Medical Consultants:   Renal  Discharge Exam:   Vitals:   06/30/19 0416 06/30/19 0835  BP: 110/73 106/71  Pulse: 99 96  Resp: 18 16  Temp: 98.7 F (37.1 C) 99.3 F (37.4 C)  SpO2: 97% 95%   Vitals:   06/29/19 1608 06/29/19 2011 06/30/19 0416 06/30/19 0835  BP: 114/60 113/66 110/73 106/71  Pulse: 99 83 99 96  Resp: 20 20 18 16   Temp: (!) 97.5 F (36.4 C) 99 F (37.2 C) 98.7 F (37.1 C) 99.3 F (37.4 C)  TempSrc: Oral Oral Oral Oral  SpO2: 95% 100% 97% 95%  Weight:      Height:        General exam: Appears calm and comfortable   The results of significant diagnostics from this hospitalization  (including imaging, microbiology, ancillary and laboratory) are listed below for reference.     Procedures and Diagnostic Studies:   DG Chest Port 1 View  Result Date: 06/27/2019 CLINICAL DATA:  74 year old female with weakness. Diarrhea for 2-3 days. EXAM: PORTABLE CHEST 1 VIEW COMPARISON:  Chest radiographs 05/30/2019 and earlier. FINDINGS: Portable AP semi upright view at 1253 hours. Stable right chest dialysis catheter. Stable lung volumes and mediastinal contours. Stable pulmonary vascularity without overt edema. Allowing for portable technique the lungs are clear. Visualized tracheal air column is within normal limits. No acute osseous abnormality identified. IMPRESSION: No acute cardiopulmonary abnormality. Electronically Signed   By: Genevie Ann M.D.   On: 06/27/2019 13:00     Labs:   Basic Metabolic Panel: Recent Labs  Lab 06/27/19 1230 06/27/19 1230 06/28/19 0642 06/28/19 0642 06/29/19 0322 06/30/19 0556  NA 131*  --  127*  --  135 138  K 4.0   < > 4.2   < > 4.1 4.0  CL 90*  --  88*  --  96* 100  CO2 22  --  21*  --  25 25  GLUCOSE 149*  --  95  --  96 97  BUN 39*  --  45*  --  12 12  CREATININE 5.74*  --  6.23*  --  3.04* 2.10*  CALCIUM 8.9  --  9.0  --  8.8* 9.8   < > = values in this interval not displayed.   GFR Estimated Creatinine Clearance: 22 mL/min (A) (by C-G formula based on SCr of 2.1 mg/dL (H)). Liver Function Tests: Recent Labs  Lab 06/27/19 1230  AST 33  ALT 14  ALKPHOS 145*  BILITOT 0.7  PROT 5.9*  ALBUMIN 2.1*   No results for input(s): LIPASE, AMYLASE in the last 168 hours. No results for input(s): AMMONIA in the last 168 hours. Coagulation profile Recent Labs  Lab 06/27/19 1230  INR 1.0    CBC: Recent Labs  Lab 06/27/19 1230 06/28/19 0642 06/29/19 0322 06/30/19 0556  WBC 13.2* 12.9* 8.4 9.1  NEUTROABS 10.9*  --   --   --   HGB 10.9* 10.5* 10.4* 11.3*  HCT 36.4 34.5* 33.6* 38.4  MCV 94.1 90.6 92.1 94.8  PLT 158 153 144* 184    Cardiac Enzymes: No results for input(s): CKTOTAL, CKMB, CKMBINDEX, TROPONINI in the last 168 hours. BNP: Invalid input(s): POCBNP CBG: Recent Labs  Lab 06/29/19 2007 06/29/19 2351 06/30/19 0408 06/30/19 0708 06/30/19 1133  GLUCAP 115* 105* 110* 88 77   D-Dimer No results for input(s): DDIMER in the last 72 hours. Hgb A1c No results for input(s): HGBA1C in the last 72 hours. Lipid Profile No results for input(s): CHOL, HDL, LDLCALC, TRIG, CHOLHDL, LDLDIRECT in the last 72 hours. Thyroid function studies No results for input(s): TSH, T4TOTAL, T3FREE, THYROIDAB in the last 72 hours.  Invalid input(s): FREET3 Anemia work up No results for input(s): VITAMINB12, FOLATE, FERRITIN, TIBC, IRON, RETICCTPCT in the last 72 hours. Microbiology Recent Results (from the past 240 hour(s))  Blood culture (routine x 2)     Status: None (Preliminary result)   Collection Time: 06/27/19 12:30 PM   Specimen: BLOOD  Result Value Ref Range Status   Specimen Description BLOOD RIGHT ANTECUBITAL  Final   Special Requests   Final    BOTTLES DRAWN AEROBIC AND ANAEROBIC Blood Culture results may not be optimal due to an inadequate volume of blood received in culture bottles   Culture   Final    NO GROWTH 4 DAYS Performed at [facility], [place] [address] [place], [place] [address]    Report Status PENDING  Incomplete  Urine culture     Status: Abnormal   Collection Time: 06/27/19  2:01 PM   Specimen: In/Out Cath Urine  Result Value Ref Range Status   Specimen Description IN/OUT CATH URINE  Final   Special Requests   Final    NONE Performed at [facility], [place] [address] [place], [place] [address]    Culture >=100,000 COLONIES/mL ESCHERICHIA COLI (A)  Final   Report Status 06/29/2019 FINAL  Final   Organism ID, Bacteria ESCHERICHIA COLI (A)  Final      Susceptibility   Escherichia coli - MIC*    AMPICILLIN 4 SENSITIVE Sensitive     CEFAZOLIN <=4 SENSITIVE Sensitive      CEFTRIAXONE <=1 SENSITIVE Sensitive     CIPROFLOXACIN <=0.25 SENSITIVE Sensitive     GENTAMICIN <=1 SENSITIVE Sensitive     IMIPENEM <=0.25 SENSITIVE Sensitive     NITROFURANTOIN <=16 SENSITIVE Sensitive     TRIMETH/SULFA <=20 SENSITIVE Sensitive     AMPICILLIN/SULBACTAM <=2 SENSITIVE Sensitive     PIP/TAZO <=4 SENSITIVE Sensitive     * >=100,000 COLONIES/mL ESCHERICHIA COLI  SARS Coronavirus 2 by RT PCR (hospital order,  performed in [facility] hospital lab) Nasopharyngeal Nasopharyngeal Swab     Status: None   Collection Time: 06/27/19  3:39 PM   Specimen: Nasopharyngeal Swab  Result Value Ref Range Status   SARS Coronavirus 2 NEGATIVE NEGATIVE Final    Comment: (NOTE) SARS-CoV-2 target nucleic acids are NOT DETECTED. The SARS-CoV-2 RNA is generally detectable in upper and lower respiratory specimens during the acute phase of infection. The lowest concentration of SARS-CoV-2 viral copies this assay can detect is 250 copies / mL. A negative result does not preclude SARS-CoV-2 infection and should not be used as the sole basis for treatment or other patient management decisions.  A negative result may occur with improper specimen collection / handling, submission of specimen other than nasopharyngeal swab, presence of viral mutation(s) within the areas targeted by this assay, and inadequate number of viral copies (<250 copies / mL). A negative result must be combined with clinical observations, patient history, and epidemiological information. Fact Sheet for Patients:   StrictlyIdeas.no Fact Sheet for Healthcare Providers: BankingDealers.co.za This test is not yet approved or cleared  by the [place] FDA and has been authorized for detection and/or diagnosis of SARS-CoV-2 by FDA under an Emergency Use Authorization (EUA).  This EUA will remain in effect (meaning this test can be used) for the duration of the COVID-19 declaration  under Section 564(b)(1) of the Act, 21 U.S.C. section 360bbb-3(b)(1), unless the authorization is terminated or revoked sooner. Performed at [facility], [place] [address], [place], [place] [address]   Blood culture (routine x 2)     Status: None (Preliminary result)   Collection Time: 06/27/19  8:29 PM   Specimen: BLOOD  Result Value Ref Range Status   Specimen Description BLOOD LEFT ANTECUBITAL  Final   Special Requests   Final    BOTTLES DRAWN AEROBIC ONLY Blood Culture results may not be optimal due to an inadequate volume of blood received in culture bottles   Culture   Final    NO GROWTH 4 DAYS Performed at [facility], [place] [address] [place], [place] [address]    Report Status PENDING  Incomplete     Discharge Instructions:   Discharge Instructions    Discharge wound care:   Complete by: As directed    Left hip: santyl and saline moistened gauze daily Apply clear moisture barrier to posterior upper thighs and buttocks after each incontinent episode. Nystatin powder for skin folds Carb mod/renal diet-- her blood sugars have been low so will d/c lantus to avoid hypoglycemia   Increase activity slowly   Complete by: As directed      Allergies as of 06/30/2019      Reactions   Avelox [moxifloxacin Hcl In Nacl] Other (See Comments)   Unknown reaction   Codeine Anaphylaxis   Penicillins Rash   Did it involve swelling of the face/tongue/throat, SOB, or low BP? Unk Did it involve sudden or severe rash/hives, skin peeling, or any reaction on the inside of your mouth or nose? Yes Did you need to seek medical attention at a hospital or doctor's office? Unk When did it last happen? Unk If all above answers are "NO", may proceed with cephalosporin use.   Sulfa Antibiotics Other (See Comments)   Unknown reaction   Dextromethorphan-guaifenesin Other (See Comments)   Reported by [facility] - unknown reaction. Pt states she is not allergic to  dextromethorphan-guaifenesin.   Shellfish Allergy Hives, Itching, Rash      Medication  List    STOP taking these medications   ascorbic acid 500 MG tablet Commonly known as: VITAMIN C   benzonatate 200 MG capsule Commonly known as: TESSALON   feeding supplement (NEPRO CARB STEADY) Liqd   Levemir FlexTouch 100 UNIT/ML FlexPen Generic drug: insulin detemir   Lidocaine 4 % Ptch Commonly known as: Aspercreme Lidocaine   rosuvastatin 40 MG tablet Commonly known as: CRESTOR     TAKE these medications   acetaminophen 325 MG tablet Commonly known as: TYLENOL Take 650 mg by mouth every 6 (six) hours as needed (for arthritic pain).   Asper-Flex 10 % cream Generic drug: trolamine salicylate Apply 1 application topically as needed for muscle pain (to painful sites).   atorvastatin 40 MG tablet Commonly known as: Lipitor Take 1 tablet (40 mg total) by mouth daily. What changed: when to take this   cephALEXin 250 MG capsule Commonly known as: KEFLEX Take 1 capsule (250 mg total) by mouth every 12 (twelve) hours.   clonazePAM 0.5 MG tablet Commonly known as: KLONOPIN Take 0.25-0.5 mg by mouth See admin instructions. Take 0.25 mg by mouth in the morning and 0.5 mg in the evening   collagenase ointment Commonly known as: SANTYL Apply topically daily.   Darbepoetin Alfa 25 MCG/0.42ML Sosy injection Commonly known as: ARANESP Inject 0.42 mLs (25 mcg total) into the vein every Tuesday with hemodialysis.   docusate sodium 100 MG capsule Commonly known as: COLACE Take 100 mg by mouth every morning.   doxercalciferol 4 MCG/2ML injection Commonly known as: HECTOROL Inject 1.5 mLs (3 mcg total) into the vein Every Tuesday,Thursday,and Saturday with dialysis.   Euthyrox 112 MCG tablet Generic drug: levothyroxine Take 112 mcg by mouth daily before breakfast. What changed: Another medication with the same name was removed. Continue taking this medication, and follow the  directions you see here.   FLUoxetine 10 MG tablet Commonly known as: PROZAC Take 1 tablet (10 mg total) by mouth daily.   furosemide 20 MG tablet Commonly known as: LASIX Take 40 mg by mouth See admin instructions. Take 40 mg by mouth in the morning on non-dialysis days: Sun/Mon/Wed/Fri   ipratropium-albuterol 0.5-2.5 (3) MG/3ML Soln Commonly known as: DUONEB Take 3 mLs by nebulization 4 (four) times daily.   K-Y LUBRICANT JELLY SENSITIVE EX Place 1 application into both nostrils as needed (as directed- for lubrication).   levalbuterol 45 MCG/ACT inhaler Commonly known as: XOPENEX HFA Inhale 2 puffs into the lungs every 4 (four) hours as needed for wheezing.   midodrine 10 MG tablet Commonly known as: PROAMATINE Take 1 tablet (10 mg total) by mouth 3 (three) times daily. What changed:   when to take this  additional instructions   montelukast 10 MG tablet Commonly known as: SINGULAIR Take 1 tablet (10 mg total) by mouth at bedtime.   NON FORMULARY Inhale into the lungs at bedtime. "BiPAP IPAP 18, EPAP 5, flow rate 5": At bedtime and during the day as needed for shortness of breath when napping   nystatin cream Commonly known as: MYCOSTATIN Apply 1 application topically 2 (two) times daily as needed (as directed, under skin folds and/or under the breasts). What changed: Another medication with the same name was changed. Make sure you understand how and when to take each.   nystatin powder Commonly known as: MYCOSTATIN/NYSTOP Apply 1 application topically 2 (two) times daily as needed (as directed- to any rashes). What changed: reasons to take this   OXYGEN Inhale 5-6 L/min into  the lungs continuous.   oxymetazoline 0.05 % nasal spray Commonly known as: AFRIN Place 1 spray into both nostrils 2 (two) times daily as needed for congestion.   pantoprazole 40 MG tablet Commonly known as: PROTONIX Take 40 mg by mouth daily before breakfast.   sevelamer carbonate 800  MG tablet Commonly known as: RENVELA Take 1 tablet (800 mg total) by mouth 3 (three) times daily with meals. What changed:   how much to take  additional instructions   SIMPLY SALINE NA Place 2 sprays into both nostrils as needed (for congestion).            Discharge Care Instructions  (From admission, onward)         Start     Ordered   06/30/19 0000  Discharge wound care:    Comments: Left hip: santyl and saline moistened gauze daily Apply clear moisture barrier to posterior upper thighs and buttocks after each incontinent episode. Nystatin powder for skin folds Carb mod/renal diet-- her blood sugars have been low so will d/c lantus to avoid hypoglycemia   06/30/19 1107         Follow-up Information    [facility] Follow up.   Specialty: Home Health Services Contact information: [address] [place] [address] [phone]        [facility] Follow up.   Why: call with questions about the hospital bed Contact information: [place]. Call.   Why: with questions about home oxygen or bipap Contact information: [place] [address] [phone]            Time coordinating discharge: 35 minutes  Signed:  Geradine Girt DO  [facility] 07/01/2019, 1:56 PM

## 2019-07-02 LAB — CULTURE, BLOOD (ROUTINE X 2)
Culture: NO GROWTH
Culture: NO GROWTH

## 2019-07-04 ENCOUNTER — Telehealth: Payer: Self-pay | Admitting: Nurse Practitioner

## 2019-07-04 NOTE — Telephone Encounter (Signed)
Joan Mosley is calling and requesting verbal orders to resume patients care. Patient was hospitalized from 6/1-6/4 for a UTI. Ok to leave detail message on [phone].

## 2019-07-05 ENCOUNTER — Telehealth: Payer: Self-pay | Admitting: Nurse Practitioner

## 2019-07-05 NOTE — Telephone Encounter (Signed)
Joan Mosley calling requesting a call back from [facility] nurse please advise, [phone]

## 2019-07-05 NOTE — Telephone Encounter (Signed)
Please inquire who entered original home health order and what exactly was ordered. Also need date of original order. I do not have any documentation of me entering previous home health order. Thank you

## 2019-07-05 NOTE — Telephone Encounter (Signed)
Joan Mosley is calling and requesting a call back. CB is [phone]

## 2019-07-05 NOTE — Telephone Encounter (Signed)
[name] please advise.  [name] called back and she said we gave verbal orders on 06/14/19 to start skilled nursing care. We also gave ok for wound care orders.  [name] expressed some concerns with home she said it was a single wide trailer hospital bed in the living room with a [employer] that doesn't get used. She was concerned cause two guys lift her with a sheet like a transfer sheet to move her, she felt it was unsafe.  [name] wants to know if its ok to resume care, stated pt doesn't have much left she is to be seen Friday and two time next week and one more after that week.

## 2019-07-05 NOTE — Telephone Encounter (Signed)
[name] please advise.  Pt's son Eulalie Speights called and stated pt was discharged from the hospital 06/27/19 for hypoglycemia and they discontinued her Levemir and he was wondering what to do if his mother's blood sugars were high if he checked. He stated they haven't been running high around 98 to 141.  Pt also asked if he schedules a hospitaliation follow-up can it be a virtual one cause they don't have transportation at the moment.

## 2019-07-05 NOTE — Telephone Encounter (Signed)
Left a voicemail for [facility] to call back.

## 2019-07-05 NOTE — Telephone Encounter (Signed)
[name] please advise.  Angel from home health is calling requesting verbal orders to resume pt care.

## 2019-07-05 NOTE — Telephone Encounter (Signed)
Ok to schedule video appt to discuss glucose management. Needs to be 46mins.

## 2019-07-05 NOTE — Telephone Encounter (Signed)
Left a voicemail for pt to call us back.

## 2019-07-05 NOTE — Telephone Encounter (Signed)
Left a voicemail for [facility] to call us back.

## 2019-07-06 NOTE — Telephone Encounter (Signed)
Ok to resume care for what is left. She should continue to teach and reinforce with family member about need for safe transfer with hoyer lift. Will discuss transfer safety with son and daughter during upcoming appt on 07/14/2019. Thank you

## 2019-07-06 NOTE — Telephone Encounter (Signed)
Pt's son was notified and scheduled appointment for 07/14/19 at 11:30.

## 2019-07-06 NOTE — Telephone Encounter (Signed)
[name] was contacted and notified and given the [place] to resume care.

## 2019-07-11 ENCOUNTER — Ambulatory Visit: Payer: Medicare Other | Admitting: Nurse Practitioner

## 2019-07-12 ENCOUNTER — Ambulatory Visit: Payer: Medicare Other | Admitting: Nurse Practitioner

## 2019-07-14 ENCOUNTER — Other Ambulatory Visit: Payer: Self-pay

## 2019-07-14 ENCOUNTER — Telehealth (INDEPENDENT_AMBULATORY_CARE_PROVIDER_SITE_OTHER): Payer: Medicare Other | Admitting: Nurse Practitioner

## 2019-07-14 ENCOUNTER — Telehealth: Payer: Self-pay | Admitting: Nurse Practitioner

## 2019-07-14 ENCOUNTER — Encounter: Payer: Self-pay | Admitting: Nurse Practitioner

## 2019-07-14 VITALS — BP 128/55 | HR 85 | Ht 61.0 in | Wt 185.0 lb

## 2019-07-14 DIAGNOSIS — E890 Postprocedural hypothyroidism: Secondary | ICD-10-CM | POA: Diagnosis not present

## 2019-07-14 DIAGNOSIS — K219 Gastro-esophageal reflux disease without esophagitis: Secondary | ICD-10-CM

## 2019-07-14 DIAGNOSIS — Z8673 Personal history of transient ischemic attack (TIA), and cerebral infarction without residual deficits: Secondary | ICD-10-CM | POA: Diagnosis not present

## 2019-07-14 DIAGNOSIS — F411 Generalized anxiety disorder: Secondary | ICD-10-CM

## 2019-07-14 DIAGNOSIS — F339 Major depressive disorder, recurrent, unspecified: Secondary | ICD-10-CM

## 2019-07-14 DIAGNOSIS — Z992 Dependence on renal dialysis: Secondary | ICD-10-CM

## 2019-07-14 DIAGNOSIS — Z794 Long term (current) use of insulin: Secondary | ICD-10-CM

## 2019-07-14 DIAGNOSIS — N186 End stage renal disease: Secondary | ICD-10-CM

## 2019-07-14 DIAGNOSIS — E782 Mixed hyperlipidemia: Secondary | ICD-10-CM

## 2019-07-14 DIAGNOSIS — J410 Simple chronic bronchitis: Secondary | ICD-10-CM

## 2019-07-14 DIAGNOSIS — E1122 Type 2 diabetes mellitus with diabetic chronic kidney disease: Secondary | ICD-10-CM

## 2019-07-14 MED ORDER — PANTOPRAZOLE SODIUM 40 MG PO TBEC: 40.0000 mg | DELAYED_RELEASE_TABLET | Freq: Every day | ORAL | 1 refills | Status: AC

## 2019-07-14 MED ORDER — ATORVASTATIN CALCIUM 20 MG PO TABS: 20.0000 mg | ORAL_TABLET | Freq: Every day | ORAL | 1 refills | Status: AC

## 2019-07-14 MED ORDER — MONTELUKAST SODIUM 10 MG PO TABS: 10.0000 mg | ORAL_TABLET | Freq: Every day | ORAL | 1 refills | Status: AC

## 2019-07-14 MED ORDER — FLUOXETINE HCL 10 MG PO TABS: 10.0000 mg | ORAL_TABLET | Freq: Every day | ORAL | 1 refills | Status: AC

## 2019-07-14 MED ORDER — LEVOTHYROXINE SODIUM 112 MCG PO TABS: 112.0000 ug | ORAL_TABLET | Freq: Every day | ORAL | 0 refills | Status: AC

## 2019-07-14 MED ORDER — CLONAZEPAM 0.5 MG PO TABS: ORAL_TABLET | ORAL | 0 refills | Status: AC

## 2019-07-14 NOTE — Telephone Encounter (Signed)
Estrella Deeds is calling to speak to clinical staff regarding patient. She states that patients son asked that patient start PT next week instead of this week. She just wanted to let the office know in case there were questions regarding why it was delayed. Please give her a call back at [phone] if you have any questions.  Thank you, Burley Saver

## 2019-07-14 NOTE — Telephone Encounter (Signed)
Joan Mosley  Pt son requested to start PT next week instead of this week on his mom.

## 2019-07-14 NOTE — Telephone Encounter (Signed)
I am confused. Just had an appt with them and he informed me that she was already getting PT 2x/week.

## 2019-07-14 NOTE — Telephone Encounter (Signed)
Joan Mosley I spoke with [place] from [place] and she meant after her hospitalization re starting and re evaluating pt she said the son called and put it off starting PT again since hospitalization till next week on Monday due to dialysis and her being tired and thought it would be to much. Joan Mosley just needed the ok for that.

## 2019-07-14 NOTE — Progress Notes (Signed)
Virtual Visit via Video Note  I connected with@ on 07/14/19 at 11:30 AM EDT by a video enabled telemedicine application and verified that I am speaking with the correct person using two identifiers.  Location: Patient:Home Provider: Office Participants: patient, daughter and son and provider   I discussed the limitations of evaluation and management by telemedicine and the availability of in person appointments. I also discussed with the patient that there may be a patient responsible charge related to this service. The patient expressed understanding and agreed to proceed.  CC:DM-pt son stated blood sugars was running 98-144-pt concerned it they do run high what to do since levemire was taken away//refills on singulair, pantoprazole, klonopin, atoravastatin, levothyroxine-pt requesting 30 day supply be sent states cheaper  History of Present Illness: DM: levemir discontinue due to hypoglemic episode which led to hospitalization. Home close reading range from 98-144 per son.  Dysphagia: Son and daughter continue to use honer thick liquids and mechanical soft diet. They are aware of aspiration precautions.  They use [employer] lift during transportation She has daily bowel movement and occassional urination Current dialysis schedule Tues, Thurs and Satuday  Anxiety: Stable mood with klonopin and prozac. Denies any excessive daytime somnolence.   Observations/Objective: Physical Exam Vitals reviewed.  Constitutional:      General: She is not in acute distress.    Appearance: She is obese.  Eyes:     Extraocular Movements: Extraocular movements intact.     Conjunctiva/sclera: Conjunctivae normal.  Cardiovascular:     Rate and Rhythm: Normal rate.     Pulses: Normal pulses.  Pulmonary:     Effort: Pulmonary effort is normal.  Neurological:     Mental Status: She is alert.     Comments: Oriented to person, place and family    Assessment and Plan: Haiden was seen today for  follow-up.  Diagnoses and all orders for this visit:  Postoperative hypothyroidism -     levothyroxine (EUTHYROX) 112 MCG tablet; Take 1 tablet (112 mcg total) by mouth daily before breakfast.  History of CVA (cerebrovascular accident) -     atorvastatin (LIPITOR) 20 MG tablet; Take 1 tablet (20 mg total) by mouth daily.  Major depression, recurrent, chronic (HCC) -     clonazePAM (KLONOPIN) 0.5 MG tablet; Take 0.25 mg on Tues, Thurs & Sat only and 0.5 mg in the evening -     FLUoxetine (PROZAC) 10 MG tablet; Take 1 tablet (10 mg total) by mouth daily.  Simple chronic bronchitis (HCC) -     montelukast (SINGULAIR) 10 MG tablet; Take 1 tablet (10 mg total) by mouth at bedtime.  GAD (generalized anxiety disorder) -     clonazePAM (KLONOPIN) 0.5 MG tablet; Take 0.25 mg on Tues, Thurs & Sat only and 0.5 mg in the evening  Mixed hyperlipidemia -     atorvastatin (LIPITOR) 20 MG tablet; Take 1 tablet (20 mg total) by mouth daily.  Gastroesophageal reflux disease without esophagitis -     pantoprazole (PROTONIX) 40 MG tablet; Take 1 tablet (40 mg total) by mouth daily before breakfast.  Type 2 diabetes mellitus with chronic kidney disease on chronic dialysis, with long-term current use of insulin (HCC)   Follow Up Instructions: No need for insulin at this time Continue to monitor glucose Am and PM. Call office if fasting glucose >200. Medication refills sent F/up in 2weeks (F2F)   I discussed the assessment and treatment plan with the patient. The patient was provided an opportunity to ask  questions and all were answered. The patient agreed with the plan and demonstrated an understanding of the instructions.   The patient was advised to call back or seek an in-person evaluation if the symptoms worsen or if the condition fails to improve as anticipated . Wilfred Lacy, NP

## 2019-07-14 NOTE — Patient Instructions (Signed)
No need for insulin at this time Continue to monitor glucose Am and PM. Call office if fasting glucose >200. Medication refills sent F/up in 2weeks (F2F)

## 2019-07-17 NOTE — Telephone Encounter (Signed)
Eliezer Lofts from [facility] was contacted and given the verbal ok for PT.

## 2019-07-17 NOTE — Telephone Encounter (Signed)
ok 

## 2019-07-18 ENCOUNTER — Telehealth: Payer: Self-pay | Admitting: Nurse Practitioner

## 2019-07-18 NOTE — Telephone Encounter (Signed)
Joan Mosley was notified and given the verbal OK for PT orders.

## 2019-07-18 NOTE — Telephone Encounter (Signed)
Joan Mosley with [facility] called to give her plan: Twice weekly x 1 week then once weekly x 2 weeks. Please call her to to approve, [phone].

## 2019-07-18 NOTE — Telephone Encounter (Signed)
ok 

## 2019-07-18 NOTE — Telephone Encounter (Signed)
[name] please advise.  Amy from [facility] is calling to get verbal ok for twice weekly for 1 week and then once weekly for 2 weeks.

## 2019-07-19 ENCOUNTER — Telehealth: Payer: Self-pay | Admitting: Nurse Practitioner

## 2019-07-19 NOTE — Telephone Encounter (Signed)
[name] please advise.  Joan Mosley is wanting ok for nursing twice weekly for 2 weeks and once weekly for 1 week and for wound orders stage 3 on left leg that is getting worse. Also needs MSW consult for 1 follow-up.

## 2019-07-19 NOTE — Telephone Encounter (Signed)
[name] please advise.  MSW is Environmental consultant.

## 2019-07-19 NOTE — Telephone Encounter (Signed)
[facility] for nurse visit and wound care What is MSW consult?

## 2019-07-19 NOTE — Telephone Encounter (Signed)
Angel with [facility] is requesting nursing for twice weekly x 2 weeks and once weekly x 1 week. Also needs wound orders for stage III wound on left leg that is getting worse. Also needs MSW consult for one follow up. Her number is [phone].

## 2019-07-20 NOTE — Telephone Encounter (Signed)
Ok to order 

## 2019-07-20 NOTE — Telephone Encounter (Signed)
[name] was notified and give the OK for nurse visits, wound care, and the MSW (medical social worker)

## 2019-07-21 NOTE — Telephone Encounter (Signed)
Joan Mosley was notified and an made sure OK was given, Joan Mosley just needed a verbal for the MSW (medical social worker), they send one out.

## 2019-07-24 ENCOUNTER — Telehealth: Payer: Self-pay | Admitting: Nurse Practitioner

## 2019-07-24 NOTE — Telephone Encounter (Signed)
Joan Mosley with [facility] health saw patient this morning and called with an update that her heart rates were 97-106. Her number is [phone]

## 2019-07-24 NOTE — Telephone Encounter (Signed)
[facility] health saw pt and pt heart rates was 97-106 this morning.

## 2019-07-25 ENCOUNTER — Other Ambulatory Visit: Payer: Self-pay

## 2019-07-25 ENCOUNTER — Encounter: Payer: Self-pay | Admitting: Nurse Practitioner

## 2019-07-25 ENCOUNTER — Emergency Department (HOSPITAL_COMMUNITY)
Admission: EM | Admit: 2019-07-25 | Discharge: 2019-07-26 | Disposition: A | Discharge status: Home / Self Care | Payer: Medicare Other | Attending: Emergency Medicine | Admitting: Emergency Medicine

## 2019-07-25 ENCOUNTER — Telehealth: Payer: Self-pay | Admitting: Nurse Practitioner

## 2019-07-25 ENCOUNTER — Encounter (HOSPITAL_COMMUNITY): Payer: Self-pay

## 2019-07-25 DIAGNOSIS — Z79899 Other long term (current) drug therapy: Secondary | ICD-10-CM | POA: Diagnosis not present

## 2019-07-25 DIAGNOSIS — Z9981 Dependence on supplemental oxygen: Secondary | ICD-10-CM | POA: Insufficient documentation

## 2019-07-25 DIAGNOSIS — N186 End stage renal disease: Secondary | ICD-10-CM | POA: Diagnosis not present

## 2019-07-25 DIAGNOSIS — Z992 Dependence on renal dialysis: Secondary | ICD-10-CM | POA: Insufficient documentation

## 2019-07-25 DIAGNOSIS — Z5189 Encounter for other specified aftercare: Secondary | ICD-10-CM

## 2019-07-25 DIAGNOSIS — Z87891 Personal history of nicotine dependence: Secondary | ICD-10-CM | POA: Diagnosis not present

## 2019-07-25 DIAGNOSIS — E1140 Type 2 diabetes mellitus with diabetic neuropathy, unspecified: Secondary | ICD-10-CM | POA: Diagnosis not present

## 2019-07-25 DIAGNOSIS — L89210 Pressure ulcer of right hip, unstageable: Secondary | ICD-10-CM | POA: Diagnosis not present

## 2019-07-25 DIAGNOSIS — J449 Chronic obstructive pulmonary disease, unspecified: Secondary | ICD-10-CM | POA: Diagnosis not present

## 2019-07-25 DIAGNOSIS — E1122 Type 2 diabetes mellitus with diabetic chronic kidney disease: Secondary | ICD-10-CM | POA: Insufficient documentation

## 2019-07-25 DIAGNOSIS — E039 Hypothyroidism, unspecified: Secondary | ICD-10-CM | POA: Insufficient documentation

## 2019-07-25 DIAGNOSIS — F0390 Unspecified dementia without behavioral disturbance: Secondary | ICD-10-CM | POA: Insufficient documentation

## 2019-07-25 DIAGNOSIS — L89151 Pressure ulcer of sacral region, stage 1: Secondary | ICD-10-CM | POA: Diagnosis not present

## 2019-07-25 DIAGNOSIS — Z8673 Personal history of transient ischemic attack (TIA), and cerebral infarction without residual deficits: Secondary | ICD-10-CM | POA: Diagnosis not present

## 2019-07-25 NOTE — Telephone Encounter (Signed)
Patient son is calling and requesting a call back regarding patients health. CB is [phone]

## 2019-07-25 NOTE — Telephone Encounter (Signed)
Joan Mosley states pressure ulcer has gotten worse: increased redness and has crater at center. He is concerned about sepsis. Joan Mosley does not have an appt with wound clinic till middle of July. He will like to know if the best cause of action will be to go to there ED? I advised him to have her transported to the ED if there os concern about infection. He verbalized understanding.

## 2019-07-25 NOTE — ED Triage Notes (Signed)
Per [facility] pt from dialysis, pt has full treatment today. Dialysis T/TH/Sat. Pt lives with her two sons, they noted a sacral wound prior to her leaving dialysis, they called her PCP and they instructed them to come here for further evaluation. Pt with no complaints. Wears 6L [place] at home   BP 132/52 HR 88 94% 6L [place]  RR 12

## 2019-07-25 NOTE — Telephone Encounter (Signed)
[name] please advise.  Pt son sent a picture of pt's wound and was wondering if he should take her to [facility] after to be treated for these wounds. He feels like this is an infection and is worried and wanted your opinion before he took her to the ER at [facility] an expose her to any other sickness.

## 2019-07-26 ENCOUNTER — Ambulatory Visit: Payer: Medicare Other | Admitting: Nurse Practitioner

## 2019-07-26 LAB — CBC WITH DIFFERENTIAL/PLATELET
Abs Immature Granulocytes: 0.21 10*3/uL — ABNORMAL HIGH (ref 0.00–0.07)
Basophils Absolute: 0.1 10*3/uL (ref 0.0–0.1)
Basophils Relative: 1 %
Eosinophils Absolute: 0.2 10*3/uL (ref 0.0–0.5)
Eosinophils Relative: 2 %
HCT: 37.9 % (ref 36.0–46.0)
Hemoglobin: 11.1 g/dL — ABNORMAL LOW (ref 12.0–15.0)
Immature Granulocytes: 1 %
Lymphocytes Relative: 14 %
Lymphs Abs: 2.1 10*3/uL (ref 0.7–4.0)
MCH: 28.2 pg (ref 26.0–34.0)
MCHC: 29.3 g/dL — ABNORMAL LOW (ref 30.0–36.0)
MCV: 96.2 fL (ref 80.0–100.0)
Monocytes Absolute: 1.3 10*3/uL — ABNORMAL HIGH (ref 0.1–1.0)
Monocytes Relative: 9 %
Neutro Abs: 10.8 10*3/uL — ABNORMAL HIGH (ref 1.7–7.7)
Neutrophils Relative %: 73 %
Platelets: 207 10*3/uL (ref 150–400)
RBC: 3.94 MIL/uL (ref 3.87–5.11)
RDW: 21.2 % — ABNORMAL HIGH (ref 11.5–15.5)
WBC: 14.7 10*3/uL — ABNORMAL HIGH (ref 4.0–10.5)
nRBC: 0 % (ref 0.0–0.2)

## 2019-07-26 LAB — LACTIC ACID, PLASMA: Lactic Acid, Venous: 1.5 mmol/L (ref 0.5–1.9)

## 2019-07-26 LAB — BASIC METABOLIC PANEL
Anion gap: 15 (ref 5–15)
BUN: 16 mg/dL (ref 8–23)
CO2: 27 mmol/L (ref 22–32)
Calcium: 9.2 mg/dL (ref 8.9–10.3)
Chloride: 92 mmol/L — ABNORMAL LOW (ref 98–111)
Creatinine, Ser: 2.61 mg/dL — ABNORMAL HIGH (ref 0.44–1.00)
GFR calc Af Amer: 20 mL/min — ABNORMAL LOW (ref >60)
GFR calc non Af Amer: 17 mL/min — ABNORMAL LOW (ref >60)
Glucose, Bld: 94 mg/dL (ref 70–99)
Potassium: 3.7 mmol/L (ref 3.5–5.1)
Sodium: 134 mmol/L — ABNORMAL LOW (ref 135–145)

## 2019-07-26 NOTE — ED Provider Notes (Signed)
[facility] EMERGENCY DEPARTMENT Provider Note   CSN: [id number] Arrival date & time: 07/25/19  1734     History Chief Complaint  Patient presents with  . Wound Check    Joan Mosley is a 74 y.o. female.  HPI     This is a 74 year old female with a history of COPD, diabetes, end-stage renal disease on dialysis Tuesday, Thursday, and Saturday who presents with concern for pressure ulcers.  Per triage note, patient noted to have sacral wound at dialysis and requested to be further evaluated.  Patient is really unable to tell me why she is here.  She is oriented to herself and place but not time.  She reports pain all over.  Unable to get any further information from the patient.  Level 5 caveat for altered mental status; unclear baseline  I have reviewed the patient's chart.  Note from the patient's son indicates worsening pressure ulcers.  She is due to see wound care in July.  Some concern for acute worsening and he requested evaluation.  I am unable to get up with him at this time.  Past Medical History:  Diagnosis Date  . Arthritis   . COPD (chronic obstructive pulmonary disease) ([place])   . Diabetic neuropathy ([place])   . 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 Renal disorder    ESRD  . Sleep apnea    wears BPAP at home at night  . Thyroid disease   . TIA (transient ischemic attack)   . Type 2 diabetes mellitus with peripheral neuropathy [facility])     Patient Active Problem List   Diagnosis Date Noted  . Major depression, recurrent, chronic ([place]) 07/14/2019  . Gastroesophageal reflux disease without esophagitis 07/14/2019  . History of CVA (cerebrovascular accident) 07/14/2019  . Hyponatremia 06/28/2019  . Candidal skin infection 06/28/2019  . UTI (urinary tract infection) 06/27/2019  . Unspecified protein-calorie malnutrition ([place]) 06/07/2019  . Pressure injury of skin 06/02/2019  . Urinary tract infection without hematuria 05/31/2019  .  Symptomatic anemia 05/04/2019  . Neurocognitive deficits 04/27/2019  . Adult failure to thrive   . Encephalopathy, unspecified 04/20/2019  . Stroke ([place]) 04/20/2019  . Dysphagia 03/29/2019  . Aspiration into airway   . COVID-19 02/08/2019  . Hypotension 12/24/2018  . Aspiration pneumonia of right lower lobe ([place])   . COPD (chronic obstructive pulmonary disease) ([place]) 11/11/2018  . TIA (transient ischemic attack)   . HLD (hyperlipidemia)   . Type II diabetes mellitus with renal manifestations ([place])   . Depression with anxiety   . Advanced care planning/counseling discussion   . Goals of care, counseling/discussion   . Palliative care by specialist   . Chronic respiratory failure ([place]) 09/08/2018  . Debility   . Chronic respiratory failure with hypoxia, on home O2 therapy ([place]) 09/06/2018  . Nontraumatic subdural hemorrhage, unspecified ([place]) 08/22/2018  . Elevated troponin 08/22/2018  . ESRD on dialysis ([place]) 08/22/2018  . Anxiety 07/04/2018  . Counseling regarding advanced directives and goals of care 07/01/2018  . Full code status 07/01/2018  . Drug-induced constipation 07/01/2018  . Hemodialysis-associated hypotension 07/01/2018  . Dialysis patient ([place]) 07/01/2018  . HOH (hard of hearing) 07/01/2018  . Stage 5 chronic kidney disease on chronic dialysis ([place]) 07/01/2018  . Type 2 diabetes mellitus with diabetic polyneuropathy, with long-term current use of insulin ([place]) 07/01/2018  . Hypothyroidism 07/01/2018  . Gait instability 07/01/2018  . Hyperkalemia 04/20/2018  . Cerebral infarction, unspecified ([place])  04/05/2018  . Chronic diastolic (congestive) heart failure ([place]) 04/05/2018  . Diarrhea, unspecified 04/05/2018  . Pain, unspecified 04/05/2018  . Pruritus, unspecified 04/05/2018  . Shortness of breath 04/05/2018  . Coagulation defect, unspecified ([place]) 03/31/2018  . Anemia in chronic kidney disease 03/25/2018  . Iron deficiency anemia, unspecified 03/25/2018  .  Secondary hyperparathyroidism of renal origin ([place]) 03/25/2018    Past Surgical History:  Procedure Laterality Date  . ABDOMINAL HYSTERECTOMY    . ENDOSCOPIC RETROGRADE CHOLANGIOPANCREATOGRAPHY (ERCP) WITH PROPOFOL N/A 06/02/2019   Procedure: ENDOSCOPIC RETROGRADE CHOLANGIOPANCREATOGRAPHY (ERCP) WITH PROPOFOL;  Surgeon: [name], MD;  Location: [facility];  Service: Gastroenterology;  Laterality: N/A;  . IR FLUORO GUIDE CV LINE RIGHT  11/14/2018  . RECTOCELE REPAIR    . REMOVAL OF STONES  06/02/2019   Procedure: REMOVAL OF STONES;  Surgeon: [name], MD;  Location: [facility] ENDOSCOPY;  Service: Gastroenterology;;  . REPLACEMENT TOTAL KNEE BILATERAL Bilateral 2008  . SPHINCTEROTOMY  06/02/2019   Procedure: SPHINCTEROTOMY;  Surgeon: [name], MD;  Location: [facility] ENDOSCOPY;  Service: Gastroenterology;;  . THYROIDECTOMY     80%  . VAGINAL WOUND CLOSURE / REPAIR       OB History   No obstetric history on file.     Family History  Problem Relation Age of Onset  . Heart disease Mother   . Hypertension Mother   . Heart disease Father   . Hypertension Father     Social History   Tobacco Use  . Smoking status: Former Smoker    Packs/day: 1.00    Years: 35.00    Pack years: 35.00    Quit date: 03/29/1981    Years since quitting: 38.3  . Smokeless tobacco: Never Used  Vaping Use  . Vaping Use: Never used  Substance Use Topics  . Alcohol use: Never  . Drug use: Never    Home Medications Prior to Admission medications   Medication Sig Start Date End Date Taking? Authorizing Provider  acetaminophen (TYLENOL) 325 MG tablet Take 650 mg by mouth every 6 (six) hours as needed (for arthritic pain).     [provider]  atorvastatin (LIPITOR) 20 MG tablet Take 1 tablet (20 mg total) by mouth daily. 07/14/19   Nche, Charlene Brooke, NP  clonazePAM (KLONOPIN) 0.5 MG tablet Take 0.25 mg on Tues, Thurs & Sat only and 0.5 mg in the evening 07/14/19   Nche, Charlene Brooke,  NP  collagenase (SANTYL) ointment Apply topically daily. 07/01/19   Geradine Girt, DO  Darbepoetin Alfa (ARANESP) 25 MCG/0.42ML SOSY injection Inject 0.42 mLs (25 mcg total) into the vein every Tuesday with hemodialysis. 11/22/18   Hosie Poisson, MD  docusate sodium (COLACE) 100 MG capsule Take 100 mg by mouth every morning.    [provider]  doxercalciferol (HECTOROL) 4 MCG/2ML injection Inject 1.5 mLs (3 mcg total) into the vein Every Tuesday,Thursday,and Saturday with dialysis. 04/25/19   Hosie Poisson, MD  FLUoxetine (PROZAC) 10 MG tablet Take 1 tablet (10 mg total) by mouth daily. 07/14/19   Nche, Charlene Brooke, NP  furosemide (LASIX) 20 MG tablet Take 40 mg by mouth See admin instructions. Take 40 mg by mouth in the morning on non-dialysis days: Sun/Mon/Wed/Fri    [provider]  ipratropium-albuterol (DUONEB) 0.5-2.5 (3) MG/3ML SOLN Take 3 mLs by nebulization 4 (four) times daily.    [provider]  levalbuterol Penne Lash HFA) 45 MCG/ACT inhaler Inhale 2 puffs into the lungs every 4 (four)  hours as needed for wheezing. 05/16/19   Medina-Vargas, Monina C, NP  levothyroxine (EUTHYROX) 112 MCG tablet Take 1 tablet (112 mcg total) by mouth daily before breakfast. 07/14/19   Nche, Charlene Brooke, NP  Lubricants (K-Y LUBRICANT JELLY SENSITIVE EX) Place 1 application into both nostrils as needed (as directed- for lubrication).     [provider]  Methoxy PEG-Epoetin Beta (MIRCERA IJ) Mircera 07/13/19 07/11/20  [provider]  midodrine (PROAMATINE) 10 MG tablet Take 1 tablet (10 mg total) by mouth 3 (three) times daily. Patient taking differently: Take 10 mg by mouth 3 (three) times a week. Tues,Thur,Sat 45 min. Prior to  HD treatment 06/07/19   Thurnell Lose, MD  montelukast (SINGULAIR) 10 MG tablet Take 1 tablet (10 mg total) by mouth at bedtime. 07/14/19   Nche, Charlene Brooke, NP  NON FORMULARY Inhale into the lungs at bedtime. "BiPAP IPAP 18, EPAP 5,  flow rate 5": At bedtime and during the day as needed for shortness of breath when napping    [provider]  nystatin (MYCOSTATIN/NYSTOP) powder Apply 1 application topically 2 (two) times daily as needed (as directed- to any rashes). Patient taking differently: Apply 1 application topically 2 (two) times daily as needed (as directed, under skin folds and/or under the breasts).  05/16/19   Medina-Vargas, Monina C, NP  nystatin cream (MYCOSTATIN) Apply 1 application topically 2 (two) times daily as needed (as directed, under skin folds and/or under the breasts).    [provider]  OXYGEN Inhale 5-6 L/min into the lungs continuous.     [provider]  oxymetazoline (AFRIN) 0.05 % nasal spray Place 1 spray into both nostrils 2 (two) times daily as needed for congestion.    [provider]  pantoprazole (PROTONIX) 40 MG tablet Take 1 tablet (40 mg total) by mouth daily before breakfast. 07/14/19   Nche, Charlene Brooke, NP  SALINE NASAL SPRAY NA saline nasal spray 07/03/19   [provider]  sevelamer carbonate (RENVELA) 800 MG tablet Take 1 tablet (800 mg total) by mouth 3 (three) times daily with meals. Patient taking differently: Take 1,600 mg by mouth 3 (three) times daily with meals. And 1 tablet with a snack 05/16/19   Medina-Vargas, Monina C, NP  SIMPLY SALINE NA Place 2 sprays into both nostrils as needed (for congestion).     [provider]  trolamine salicylate (ASPER-FLEX) 10 % cream Apply 1 application topically as needed for muscle pain (to painful sites).    [provider]  heparin 5000 UNIT/ML injection Inject 1.5 mLs (7,500 Units total) into the skin every 8 (eight) hours. 02/22/19 04/20/19  Hosie Poisson, MD    Allergies    Avelox [moxifloxacin hcl in nacl], Codeine, Penicillins, Sulfa antibiotics, Dextromethorphan-guaifenesin, and Shellfish allergy  Review of Systems   Review of Systems  Unable to perform ROS: Dementia     Physical Exam Updated Vital Signs BP (!) 111/94   Pulse 100   Temp 99.1 F (37.3 C) (Oral)   Resp 19   SpO2 100%   Physical Exam Vitals and nursing note reviewed.  Constitutional:      Appearance: She is well-developed. She is obese. She is not ill-appearing.  HENT:     Head: Normocephalic and atraumatic.     Mouth/Throat:     Mouth: Mucous membranes are moist.  Eyes:     Pupils: Pupils are equal, round, and reactive to light.  Cardiovascular:     Rate and  Rhythm: Normal rate and regular rhythm.     Heart sounds: Normal heart sounds.     Comments: Dialysis catheter right upper chest Pulmonary:     Effort: Pulmonary effort is normal. No respiratory distress.     Breath sounds: No wheezing.  Abdominal:     General: Bowel sounds are normal.     Palpations: Abdomen is soft.     Tenderness: There is no abdominal tenderness.  Musculoskeletal:     Cervical back: Neck supple.     Right lower leg: Edema present.     Left lower leg: Edema present.  Skin:    General: Skin is warm and dry.     Comments: 3 cm circular pressure ulcer on the left hip, necrotic center, unstageable Stage II breakdown right inner thigh Stage I sacral decubitus   Neurological:     Mental Status: She is alert.     Comments: Disoriented  Psychiatric:     Comments: Anxious appearing     ED Results / Procedures / Treatments   Labs (all labs ordered are listed, but only abnormal results are displayed) Labs Reviewed  CBC WITH DIFFERENTIAL/PLATELET - Abnormal; Notable for the following components:      Result Value   WBC 14.7 (*)    Hemoglobin 11.1 (*)    MCHC 29.3 (*)    RDW 21.2 (*)    Neutro Abs 10.8 (*)    Monocytes Absolute 1.3 (*)    Abs Immature Granulocytes 0.21 (*)    All other components within normal limits  BASIC METABOLIC PANEL - Abnormal; Notable for the following components:   Sodium 134 (*)    Chloride 92 (*)    Creatinine, Ser 2.61 (*)    GFR calc non Af Amer 17 (*)     GFR calc Af Amer 20 (*)    All other components within normal limits  LACTIC ACID, PLASMA  LACTIC ACID, PLASMA  URINALYSIS, ROUTINE W REFLEX MICROSCOPIC    EKG None  Radiology No results found.  Procedures Procedures (including critical care time)  Medications Ordered in ED Medications - No data to display  ED Course  I have reviewed the triage vital signs and the nursing notes.  Pertinent labs & imaging results that were available during my care of the patient were reviewed by me and considered in my medical decision making (see chart for details).  Clinical Course as of Jul 26 618  Wed Jul 26, 2019  0618 I spoke with patient's son [name].  Concern for worsening ulcers and pressure points.  He reports that she does not have a hospital bed at home; however, last transition of care note indicates hospital bed delivered.  She has a wound care clinic appointment on July 19.  Offered to have our [facility] team evaluate to help expedite outpatient care.  Given that she is nontoxic-appearing, afebrile, not septic, do not feel she needs inpatient work-up.  Wounds were dressed.   [CH]    Clinical Course User Index [CH] Neema Barreira, Barbette Hair, MD   MDM Rules/Calculators/A&P                           Patient presents with concerns for worsening pressure ulcers.  She is overall nontoxic.  She is chronically ill-appearing and obese.  She is immobile and has been for some time.  Vital signs are reassuring.  She has 1 unstageable ulcer of the right hip, stage I sacral  decubitus ulcer and skin breakdown in the inner thigh.  None are particularly erythematous or infected appearing.  Lab work obtained.  She has a leukocytosis to 14 but otherwise no significant changes from her baseline.  Lactate is normal.  This would argue against sepsis.  I did order a urine as she has a history of UTI.  However, she is on dialysis and does not make much urine.  Per nursing, bladder scan negative x3.  Given that she is  afebrile not floridly septic, do not feel that she has to have a urine at this time.  See discussion with son above.  Will engage case management to evaluate for expedited outpatient evaluation.  After history, exam, and medical workup I feel the patient has been appropriately medically screened and is safe for discharge home. Pertinent diagnoses were discussed with the patient. Patient was given return precautions.   Final Clinical Impression(s) / ED Diagnoses Final diagnoses:  Pressure injury of right hip, unstageable (HCC)  Pressure injury of sacral region, stage 1  Visit for wound check    Rx / DC Orders ED Discharge Orders    None       Merryl Hacker, MD 07/26/19 [phone]

## 2019-07-26 NOTE — Discharge Instructions (Addendum)
Transitional care team consult and social work consult was placed to aid with expediting wound care evaluation.  Continue to turn the patient frequently at home.

## 2019-07-26 NOTE — ED Notes (Signed)
Pt unable to sign for herself, son [name] called, verbalizes understanding of discharge instructions. Pt a&ox4 upon departure, also verbalizes understanding of discharge instructions.

## 2019-07-26 NOTE — ED Notes (Signed)
Called [facility] for transport home--Joan Mosley 

## 2019-07-28 ENCOUNTER — Telehealth: Payer: Self-pay

## 2019-07-28 ENCOUNTER — Telehealth: Payer: Self-pay | Admitting: Surgery

## 2019-07-28 NOTE — Telephone Encounter (Signed)
Documentation that there was a visit within the last 6 months and labs supporting Co2 levels low and hypoxia visit was printed and faxed to [facility].

## 2019-07-28 NOTE — Telephone Encounter (Signed)
ED CM contacted patient's daughter [name] to follow up with air mattress delivery from [facility].  Daughter states, she has not heard from anyone but believes her brother has.  CM also following up on changing  wound care appointment to [facility] for a sooner appointment, She states, she will contact them on Tuesday 7/6 to make the arrangement. CM will follow up with family next week.

## 2019-08-01 NOTE — Social Work (Addendum)
08/01/2019 @9 :49am  TOC CSW was contacted by son, Deprey [phone] in regards to follow up with air mattress delivery from [facility].  Son stated that [place] needs an order from the doctor in order to proceed with fulfilling order.  CSW spoke with Arville Care, RN [facility], she will inquire about information in regards to [place] order.  CSW will continue to assist [place], RN [facility] with follow up.  Evelynn Hench Tarpley-Carter, MSW, [place] ED Transitions of Engineer, building services Health [phone]

## 2019-08-02 ENCOUNTER — Encounter (HOSPITAL_COMMUNITY): Payer: Self-pay | Admitting: Internal Medicine

## 2019-08-02 ENCOUNTER — Inpatient Hospital Stay (HOSPITAL_COMMUNITY)
Admission: EM | Admit: 2019-08-02 | Discharge: 2019-08-27 | DRG: 871 | Disposition: E | Payer: Medicare Other | Attending: Family Medicine | Admitting: Family Medicine

## 2019-08-02 ENCOUNTER — Telehealth: Payer: Self-pay | Admitting: Nurse Practitioner

## 2019-08-02 ENCOUNTER — Emergency Department (HOSPITAL_COMMUNITY): Payer: Medicare Other

## 2019-08-02 DIAGNOSIS — M25552 Pain in left hip: Secondary | ICD-10-CM

## 2019-08-02 DIAGNOSIS — Z9981 Dependence on supplemental oxygen: Secondary | ICD-10-CM | POA: Diagnosis not present

## 2019-08-02 DIAGNOSIS — J9811 Atelectasis: Secondary | ICD-10-CM | POA: Diagnosis present

## 2019-08-02 DIAGNOSIS — Z515 Encounter for palliative care: Secondary | ICD-10-CM

## 2019-08-02 DIAGNOSIS — F0390 Unspecified dementia without behavioral disturbance: Secondary | ICD-10-CM | POA: Diagnosis present

## 2019-08-02 DIAGNOSIS — L8990 Pressure ulcer of unspecified site, unspecified stage: Secondary | ICD-10-CM | POA: Diagnosis not present

## 2019-08-02 DIAGNOSIS — Z8673 Personal history of transient ischemic attack (TIA), and cerebral infarction without residual deficits: Secondary | ICD-10-CM | POA: Diagnosis not present

## 2019-08-02 DIAGNOSIS — R6521 Severe sepsis with septic shock: Secondary | ICD-10-CM | POA: Diagnosis present

## 2019-08-02 DIAGNOSIS — Z992 Dependence on renal dialysis: Secondary | ICD-10-CM

## 2019-08-02 DIAGNOSIS — Z880 Allergy status to penicillin: Secondary | ICD-10-CM | POA: Diagnosis not present

## 2019-08-02 DIAGNOSIS — R4189 Other symptoms and signs involving cognitive functions and awareness: Secondary | ICD-10-CM | POA: Diagnosis present

## 2019-08-02 DIAGNOSIS — I9589 Other hypotension: Secondary | ICD-10-CM | POA: Diagnosis present

## 2019-08-02 DIAGNOSIS — L0390 Cellulitis, unspecified: Secondary | ICD-10-CM | POA: Diagnosis present

## 2019-08-02 DIAGNOSIS — E1129 Type 2 diabetes mellitus with other diabetic kidney complication: Secondary | ICD-10-CM | POA: Diagnosis present

## 2019-08-02 DIAGNOSIS — J410 Simple chronic bronchitis: Secondary | ICD-10-CM | POA: Diagnosis not present

## 2019-08-02 DIAGNOSIS — N2581 Secondary hyperparathyroidism of renal origin: Secondary | ICD-10-CM | POA: Diagnosis present

## 2019-08-02 DIAGNOSIS — E876 Hypokalemia: Secondary | ICD-10-CM | POA: Diagnosis present

## 2019-08-02 DIAGNOSIS — Z7189 Other specified counseling: Secondary | ICD-10-CM | POA: Diagnosis not present

## 2019-08-02 DIAGNOSIS — J9611 Chronic respiratory failure with hypoxia: Secondary | ICD-10-CM

## 2019-08-02 DIAGNOSIS — Z8249 Family history of ischemic heart disease and other diseases of the circulatory system: Secondary | ICD-10-CM

## 2019-08-02 DIAGNOSIS — E039 Hypothyroidism, unspecified: Secondary | ICD-10-CM | POA: Diagnosis present

## 2019-08-02 DIAGNOSIS — E1142 Type 2 diabetes mellitus with diabetic polyneuropathy: Secondary | ICD-10-CM | POA: Diagnosis present

## 2019-08-02 DIAGNOSIS — Z7401 Bed confinement status: Secondary | ICD-10-CM

## 2019-08-02 DIAGNOSIS — Z885 Allergy status to narcotic agent status: Secondary | ICD-10-CM

## 2019-08-02 DIAGNOSIS — N186 End stage renal disease: Secondary | ICD-10-CM | POA: Diagnosis present

## 2019-08-02 DIAGNOSIS — E890 Postprocedural hypothyroidism: Secondary | ICD-10-CM | POA: Diagnosis present

## 2019-08-02 DIAGNOSIS — G9341 Metabolic encephalopathy: Secondary | ICD-10-CM | POA: Diagnosis present

## 2019-08-02 DIAGNOSIS — Z90710 Acquired absence of both cervix and uterus: Secondary | ICD-10-CM

## 2019-08-02 DIAGNOSIS — Z794 Long term (current) use of insulin: Secondary | ICD-10-CM | POA: Diagnosis not present

## 2019-08-02 DIAGNOSIS — L89220 Pressure ulcer of left hip, unstageable: Secondary | ICD-10-CM | POA: Diagnosis present

## 2019-08-02 DIAGNOSIS — Z993 Dependence on wheelchair: Secondary | ICD-10-CM

## 2019-08-02 DIAGNOSIS — Z882 Allergy status to sulfonamides status: Secondary | ICD-10-CM

## 2019-08-02 DIAGNOSIS — Z91013 Allergy to seafood: Secondary | ICD-10-CM

## 2019-08-02 DIAGNOSIS — E1122 Type 2 diabetes mellitus with diabetic chronic kidney disease: Secondary | ICD-10-CM | POA: Diagnosis present

## 2019-08-02 DIAGNOSIS — J449 Chronic obstructive pulmonary disease, unspecified: Secondary | ICD-10-CM | POA: Diagnosis present

## 2019-08-02 DIAGNOSIS — Z6834 Body mass index (BMI) 34.0-34.9, adult: Secondary | ICD-10-CM

## 2019-08-02 DIAGNOSIS — I2721 Secondary pulmonary arterial hypertension: Secondary | ICD-10-CM | POA: Diagnosis present

## 2019-08-02 DIAGNOSIS — Z66 Do not resuscitate: Secondary | ICD-10-CM | POA: Diagnosis present

## 2019-08-02 DIAGNOSIS — I120 Hypertensive chronic kidney disease with stage 5 chronic kidney disease or end stage renal disease: Secondary | ICD-10-CM | POA: Diagnosis present

## 2019-08-02 DIAGNOSIS — J90 Pleural effusion, not elsewhere classified: Secondary | ICD-10-CM | POA: Diagnosis present

## 2019-08-02 DIAGNOSIS — D631 Anemia in chronic kidney disease: Secondary | ICD-10-CM | POA: Diagnosis present

## 2019-08-02 DIAGNOSIS — M1990 Unspecified osteoarthritis, unspecified site: Secondary | ICD-10-CM | POA: Diagnosis present

## 2019-08-02 DIAGNOSIS — Z20822 Contact with and (suspected) exposure to covid-19: Secondary | ICD-10-CM | POA: Diagnosis present

## 2019-08-02 DIAGNOSIS — J432 Centrilobular emphysema: Secondary | ICD-10-CM | POA: Diagnosis present

## 2019-08-02 DIAGNOSIS — G4733 Obstructive sleep apnea (adult) (pediatric): Secondary | ICD-10-CM | POA: Diagnosis present

## 2019-08-02 DIAGNOSIS — R0902 Hypoxemia: Secondary | ICD-10-CM

## 2019-08-02 DIAGNOSIS — E8889 Other specified metabolic disorders: Secondary | ICD-10-CM | POA: Diagnosis present

## 2019-08-02 DIAGNOSIS — E785 Hyperlipidemia, unspecified: Secondary | ICD-10-CM | POA: Diagnosis present

## 2019-08-02 DIAGNOSIS — E11649 Type 2 diabetes mellitus with hypoglycemia without coma: Secondary | ICD-10-CM | POA: Diagnosis present

## 2019-08-02 DIAGNOSIS — Z96653 Presence of artificial knee joint, bilateral: Secondary | ICD-10-CM | POA: Diagnosis present

## 2019-08-02 DIAGNOSIS — R4182 Altered mental status, unspecified: Secondary | ICD-10-CM | POA: Diagnosis present

## 2019-08-02 DIAGNOSIS — I959 Hypotension, unspecified: Secondary | ICD-10-CM | POA: Diagnosis present

## 2019-08-02 DIAGNOSIS — N189 Chronic kidney disease, unspecified: Secondary | ICD-10-CM | POA: Diagnosis present

## 2019-08-02 DIAGNOSIS — Z87891 Personal history of nicotine dependence: Secondary | ICD-10-CM

## 2019-08-02 DIAGNOSIS — J9621 Acute and chronic respiratory failure with hypoxia: Secondary | ICD-10-CM | POA: Diagnosis present

## 2019-08-02 DIAGNOSIS — L89210 Pressure ulcer of right hip, unstageable: Secondary | ICD-10-CM | POA: Diagnosis present

## 2019-08-02 DIAGNOSIS — A419 Sepsis, unspecified organism: Principal | ICD-10-CM | POA: Diagnosis present

## 2019-08-02 DIAGNOSIS — Z6841 Body Mass Index (BMI) 40.0 and over, adult: Secondary | ICD-10-CM

## 2019-08-02 DIAGNOSIS — I953 Hypotension of hemodialysis: Secondary | ICD-10-CM | POA: Diagnosis not present

## 2019-08-02 DIAGNOSIS — E1151 Type 2 diabetes mellitus with diabetic peripheral angiopathy without gangrene: Secondary | ICD-10-CM | POA: Diagnosis present

## 2019-08-02 DIAGNOSIS — Z888 Allergy status to other drugs, medicaments and biological substances status: Secondary | ICD-10-CM

## 2019-08-02 DIAGNOSIS — Z79899 Other long term (current) drug therapy: Secondary | ICD-10-CM

## 2019-08-02 HISTORY — DX: End stage renal disease: N18.6

## 2019-08-02 LAB — COMPREHENSIVE METABOLIC PANEL
ALT: 17 U/L (ref 0–44)
AST: 27 U/L (ref 15–41)
Albumin: 1.9 g/dL — ABNORMAL LOW (ref 3.5–5.0)
Alkaline Phosphatase: 212 U/L — ABNORMAL HIGH (ref 38–126)
Anion gap: 17 — ABNORMAL HIGH (ref 5–15)
BUN: 18 mg/dL (ref 8–23)
CO2: 25 mmol/L (ref 22–32)
Calcium: 9.4 mg/dL (ref 8.9–10.3)
Chloride: 94 mmol/L — ABNORMAL LOW (ref 98–111)
Creatinine, Ser: 2.85 mg/dL — ABNORMAL HIGH (ref 0.44–1.00)
GFR calc Af Amer: 18 mL/min — ABNORMAL LOW (ref >60)
GFR calc non Af Amer: 15 mL/min — ABNORMAL LOW (ref >60)
Glucose, Bld: 113 mg/dL — ABNORMAL HIGH (ref 70–99)
Potassium: 3 mmol/L — ABNORMAL LOW (ref 3.5–5.1)
Sodium: 136 mmol/L (ref 135–145)
Total Bilirubin: 1.3 mg/dL — ABNORMAL HIGH (ref 0.3–1.2)
Total Protein: 5.4 g/dL — ABNORMAL LOW (ref 6.5–8.1)

## 2019-08-02 LAB — CBC WITH DIFFERENTIAL/PLATELET
Abs Immature Granulocytes: 0.17 10*3/uL — ABNORMAL HIGH (ref 0.00–0.07)
Basophils Absolute: 0 10*3/uL (ref 0.0–0.1)
Basophils Relative: 0 %
Eosinophils Absolute: 0 10*3/uL (ref 0.0–0.5)
Eosinophils Relative: 0 %
HCT: 36.9 % (ref 36.0–46.0)
Hemoglobin: 11.2 g/dL — ABNORMAL LOW (ref 12.0–15.0)
Immature Granulocytes: 1 %
Lymphocytes Relative: 9 %
Lymphs Abs: 1.5 10*3/uL (ref 0.7–4.0)
MCH: 29.1 pg (ref 26.0–34.0)
MCHC: 30.4 g/dL (ref 30.0–36.0)
MCV: 95.8 fL (ref 80.0–100.0)
Monocytes Absolute: 1.2 10*3/uL — ABNORMAL HIGH (ref 0.1–1.0)
Monocytes Relative: 7 %
Neutro Abs: 14.5 10*3/uL — ABNORMAL HIGH (ref 1.7–7.7)
Neutrophils Relative %: 83 %
Platelets: 235 10*3/uL (ref 150–400)
RBC: 3.85 MIL/uL — ABNORMAL LOW (ref 3.87–5.11)
RDW: 21.3 % — ABNORMAL HIGH (ref 11.5–15.5)
Smear Review: NORMAL
WBC: 17.5 10*3/uL — ABNORMAL HIGH (ref 4.0–10.5)
nRBC: 0 % (ref 0.0–0.2)

## 2019-08-02 LAB — I-STAT ARTERIAL BLOOD GAS, ED
Acid-Base Excess: 2 mmol/L (ref 0.0–2.0)
Bicarbonate: 25.7 mmol/L (ref 20.0–28.0)
Calcium, Ion: 1.28 mmol/L (ref 1.15–1.40)
HCT: 30 % — ABNORMAL LOW (ref 36.0–46.0)
Hemoglobin: 10.2 g/dL — ABNORMAL LOW (ref 12.0–15.0)
O2 Saturation: 85 %
Patient temperature: 98.6
Potassium: 3.4 mmol/L — ABNORMAL LOW (ref 3.5–5.1)
Sodium: 134 mmol/L — ABNORMAL LOW (ref 135–145)
TCO2: 27 mmol/L (ref 22–32)
pCO2 arterial: 35.5 mmHg (ref 32.0–48.0)
pH, Arterial: 7.468 — ABNORMAL HIGH (ref 7.350–7.450)
pO2, Arterial: 47 mmHg — ABNORMAL LOW (ref 83.0–108.0)

## 2019-08-02 LAB — APTT: aPTT: 38 seconds — ABNORMAL HIGH (ref 24–36)

## 2019-08-02 LAB — PROTIME-INR
INR: 1 (ref 0.8–1.2)
Prothrombin Time: 13 seconds (ref 11.4–15.2)

## 2019-08-02 LAB — SARS CORONAVIRUS 2 BY RT PCR (HOSPITAL ORDER, PERFORMED IN ~~LOC~~ HOSPITAL LAB): SARS Coronavirus 2: NEGATIVE

## 2019-08-02 LAB — LACTIC ACID, PLASMA
Lactic Acid, Venous: 2.2 mmol/L (ref 0.5–1.9)
Lactic Acid, Venous: 2.5 mmol/L (ref 0.5–1.9)

## 2019-08-02 MED ORDER — STERILE WATER FOR INJECTION IJ SOLN
INTRAMUSCULAR | Status: AC
  Filled 2019-08-02: qty 10

## 2019-08-02 MED ORDER — ONDANSETRON HCL 4 MG PO TABS: 4.0000 mg | ORAL_TABLET | Freq: Four times a day (QID) | ORAL | Status: DC | PRN

## 2019-08-02 MED ORDER — MIDODRINE HCL 5 MG PO TABS
10.0000 mg | ORAL_TABLET | ORAL | Status: DC
  Administered 2019-08-03 – 2019-08-05 (×2): 10 mg via ORAL
  Filled 2019-08-02 (×2): qty 2

## 2019-08-02 MED ORDER — SEVELAMER CARBONATE 800 MG PO TABS
800.0000 mg | ORAL_TABLET | Freq: Two times a day (BID) | ORAL | Status: DC | PRN
  Filled 2019-08-02: qty 1

## 2019-08-02 MED ORDER — SODIUM CHLORIDE 0.9 % IV BOLUS
500.0000 mL | Freq: Once | INTRAVENOUS | Status: AC
  Administered 2019-08-02: 500 mL via INTRAVENOUS

## 2019-08-02 MED ORDER — ACETAMINOPHEN 325 MG PO TABS
650.0000 mg | ORAL_TABLET | Freq: Four times a day (QID) | ORAL | Status: DC | PRN
  Administered 2019-08-06: 650 mg via ORAL
  Filled 2019-08-02: qty 2

## 2019-08-02 MED ORDER — CAMPHOR-MENTHOL 0.5-0.5 % EX LOTN
1.0000 "application " | TOPICAL_LOTION | Freq: Three times a day (TID) | CUTANEOUS | Status: DC | PRN
  Filled 2019-08-02: qty 222

## 2019-08-02 MED ORDER — SODIUM CHLORIDE 0.9% FLUSH
3.0000 mL | Freq: Two times a day (BID) | INTRAVENOUS | Status: DC
  Administered 2019-08-02 – 2019-08-03 (×3): 3 mL via INTRAVENOUS
  Administered 2019-08-04: 10 mL via INTRAVENOUS
  Administered 2019-08-05 – 2019-08-07 (×5): 3 mL via INTRAVENOUS

## 2019-08-02 MED ORDER — SODIUM CHLORIDE 0.9 % IV BOLUS: 1000.0000 mL | Freq: Once | INTRAVENOUS | Status: DC

## 2019-08-02 MED ORDER — CLONAZEPAM 0.25 MG PO TBDP
0.2500 mg | ORAL_TABLET | ORAL | Status: DC
  Administered 2019-08-05: 0.25 mg via ORAL
  Filled 2019-08-02: qty 1

## 2019-08-02 MED ORDER — SODIUM CHLORIDE 0.9 % IV BOLUS: 500.0000 mL | Freq: Once | INTRAVENOUS | Status: DC

## 2019-08-02 MED ORDER — ATORVASTATIN CALCIUM 10 MG PO TABS
20.0000 mg | ORAL_TABLET | Freq: Every day | ORAL | Status: DC
  Administered 2019-08-03 – 2019-08-06 (×4): 20 mg via ORAL
  Filled 2019-08-02 (×4): qty 2

## 2019-08-02 MED ORDER — NEPRO/CARBSTEADY PO LIQD
237.0000 mL | Freq: Three times a day (TID) | ORAL | Status: DC | PRN
  Filled 2019-08-02: qty 237

## 2019-08-02 MED ORDER — LEVALBUTEROL TARTRATE 45 MCG/ACT IN AERO
2.0000 | INHALATION_SPRAY | RESPIRATORY_TRACT | Status: DC | PRN
  Filled 2019-08-02: qty 15

## 2019-08-02 MED ORDER — METRONIDAZOLE IN NACL 5-0.79 MG/ML-% IV SOLN
500.0000 mg | Freq: Once | INTRAVENOUS | Status: AC
  Administered 2019-08-02: 500 mg via INTRAVENOUS
  Filled 2019-08-02: qty 100

## 2019-08-02 MED ORDER — VANCOMYCIN HCL IN DEXTROSE 1-5 GM/200ML-% IV SOLN
1000.0000 mg | INTRAVENOUS | Status: DC
  Administered 2019-08-04: 1000 mg via INTRAVENOUS
  Filled 2019-08-02 (×2): qty 200

## 2019-08-02 MED ORDER — PANTOPRAZOLE SODIUM 40 MG PO TBEC
40.0000 mg | DELAYED_RELEASE_TABLET | Freq: Every day | ORAL | Status: DC
  Administered 2019-08-03 – 2019-08-06 (×3): 40 mg via ORAL
  Filled 2019-08-02 (×3): qty 1

## 2019-08-02 MED ORDER — ACETAMINOPHEN 650 MG RE SUPP: 650.0000 mg | Freq: Four times a day (QID) | RECTAL | Status: DC | PRN

## 2019-08-02 MED ORDER — MONTELUKAST SODIUM 10 MG PO TABS
10.0000 mg | ORAL_TABLET | Freq: Every day | ORAL | Status: DC
  Administered 2019-08-02 – 2019-08-05 (×4): 10 mg via ORAL
  Filled 2019-08-02 (×5): qty 1

## 2019-08-02 MED ORDER — ONDANSETRON HCL 4 MG/2ML IJ SOLN: 4.0000 mg | Freq: Four times a day (QID) | INTRAMUSCULAR | Status: DC | PRN

## 2019-08-02 MED ORDER — FLUOXETINE HCL 10 MG PO CAPS
10.0000 mg | ORAL_CAPSULE | Freq: Every day | ORAL | Status: DC
  Administered 2019-08-03 – 2019-08-06 (×4): 10 mg via ORAL
  Filled 2019-08-02 (×6): qty 1

## 2019-08-02 MED ORDER — HEPARIN SODIUM (PORCINE) 5000 UNIT/ML IJ SOLN
5000.0000 [IU] | Freq: Three times a day (TID) | INTRAMUSCULAR | Status: DC
  Administered 2019-08-02 – 2019-08-07 (×14): 5000 [IU] via SUBCUTANEOUS
  Filled 2019-08-02 (×16): qty 1

## 2019-08-02 MED ORDER — DOXERCALCIFEROL 4 MCG/2ML IV SOLN
3.0000 ug | INTRAVENOUS | Status: DC
  Administered 2019-08-05: 3 ug via INTRAVENOUS
  Filled 2019-08-02 (×2): qty 2

## 2019-08-02 MED ORDER — SODIUM CHLORIDE 0.9 % IV SOLN
2.0000 g | INTRAVENOUS | Status: DC
  Administered 2019-08-03 – 2019-08-06 (×4): 2 g via INTRAVENOUS
  Filled 2019-08-02 (×6): qty 20

## 2019-08-02 MED ORDER — DARBEPOETIN ALFA 25 MCG/0.42ML IJ SOSY: 25.0000 ug | PREFILLED_SYRINGE | INTRAMUSCULAR | Status: DC

## 2019-08-02 MED ORDER — HYDROXYZINE HCL 25 MG PO TABS: 25.0000 mg | ORAL_TABLET | Freq: Three times a day (TID) | ORAL | Status: DC | PRN

## 2019-08-02 MED ORDER — CALCIUM CARBONATE ANTACID 1250 MG/5ML PO SUSP
500.0000 mg | Freq: Four times a day (QID) | ORAL | Status: DC | PRN
  Filled 2019-08-02: qty 5

## 2019-08-02 MED ORDER — VANCOMYCIN HCL IN DEXTROSE 1-5 GM/200ML-% IV SOLN: 1000.0000 mg | Freq: Once | INTRAVENOUS | Status: DC

## 2019-08-02 MED ORDER — SODIUM CHLORIDE 0.9 % IV SOLN
2.0000 g | Freq: Once | INTRAVENOUS | Status: AC
  Administered 2019-08-02: 2 g via INTRAVENOUS
  Filled 2019-08-02: qty 20

## 2019-08-02 MED ORDER — LEVOTHYROXINE SODIUM 112 MCG PO TABS
112.0000 ug | ORAL_TABLET | Freq: Every day | ORAL | Status: DC
  Administered 2019-08-03 – 2019-08-06 (×4): 112 ug via ORAL
  Filled 2019-08-02 (×4): qty 1

## 2019-08-02 MED ORDER — VANCOMYCIN HCL 1750 MG/350ML IV SOLN
1750.0000 mg | Freq: Once | INTRAVENOUS | Status: AC
  Administered 2019-08-02: 1750 mg via INTRAVENOUS
  Filled 2019-08-02: qty 350

## 2019-08-02 MED ORDER — LACTATED RINGERS IV BOLUS
250.0000 mL | Freq: Once | INTRAVENOUS | Status: AC
  Administered 2019-08-02: 250 mL via INTRAVENOUS

## 2019-08-02 MED ORDER — SEVELAMER CARBONATE 800 MG PO TABS
800.0000 mg | ORAL_TABLET | ORAL | Status: DC
  Filled 2019-08-02: qty 1

## 2019-08-02 MED ORDER — POTASSIUM CHLORIDE 10 MEQ/100ML IV SOLN
10.0000 meq | Freq: Once | INTRAVENOUS | Status: AC
  Administered 2019-08-02: 10 meq via INTRAVENOUS
  Filled 2019-08-02: qty 100

## 2019-08-02 MED ORDER — SORBITOL 70 % SOLN
30.0000 mL | Status: DC | PRN
  Filled 2019-08-02: qty 30

## 2019-08-02 MED ORDER — ZOLPIDEM TARTRATE 5 MG PO TABS: 5.0000 mg | ORAL_TABLET | Freq: Every evening | ORAL | Status: DC | PRN

## 2019-08-02 MED ORDER — SEVELAMER CARBONATE 800 MG PO TABS
1600.0000 mg | ORAL_TABLET | Freq: Three times a day (TID) | ORAL | Status: DC
  Administered 2019-08-02 – 2019-08-04 (×2): 1600 mg via ORAL
  Filled 2019-08-02 (×4): qty 2

## 2019-08-02 MED ORDER — IPRATROPIUM-ALBUTEROL 0.5-2.5 (3) MG/3ML IN SOLN
3.0000 mL | Freq: Four times a day (QID) | RESPIRATORY_TRACT | Status: DC
  Administered 2019-08-02 – 2019-08-03 (×3): 3 mL via RESPIRATORY_TRACT
  Filled 2019-08-02 (×2): qty 3

## 2019-08-02 MED ORDER — CLONAZEPAM 0.5 MG PO TABS
0.5000 mg | ORAL_TABLET | Freq: Every day | ORAL | Status: DC
  Administered 2019-08-04 – 2019-08-05 (×2): 0.5 mg via ORAL
  Filled 2019-08-02 (×3): qty 1

## 2019-08-02 MED ORDER — DOCUSATE SODIUM 283 MG RE ENEM
1.0000 | ENEMA | RECTAL | Status: DC | PRN
  Filled 2019-08-02: qty 1

## 2019-08-02 MED ORDER — FENTANYL CITRATE (PF) 100 MCG/2ML IJ SOLN
50.0000 ug | Freq: Once | INTRAMUSCULAR | Status: AC
  Administered 2019-08-02: 50 ug via INTRAVENOUS
  Filled 2019-08-02: qty 2

## 2019-08-02 NOTE — ED Triage Notes (Addendum)
Patient from home, called out by [facility] for altered mental status, baseline orientation to self, place, situation, home health told EMS patient is confused and febrile, 100.26F axillary per EMS. Patient oriented to self only at this time.  Per EMS, patient has bilateral pressure ulcers on hips and one on right groin, was wheezing and complaining of shortness of breath. Patient wears 5L O2 [place] at baseline, initial O2 saturation 88%. Given 5mg  albuterol, saturation increased to 95%.  Dialysis on T,TH,Sat, last full treatment yesterday. Access in right chest.

## 2019-08-02 NOTE — ED Notes (Signed)
Admitting at bedside. See new orders

## 2019-08-02 NOTE — ED Notes (Signed)
Admitting paged

## 2019-08-02 NOTE — Progress Notes (Signed)
Notified bedside nurse of need to draw repeat lactic acid (3rd).

## 2019-08-02 NOTE — ED Provider Notes (Signed)
[facility] EMERGENCY DEPARTMENT Provider Note   CSN: [id number] Arrival date & time: 08/14/2019  1056     History Chief Complaint  Patient presents with  . Altered Mental Status    Joan Mosley is a 74 y.o. female history of dementia, ESRD on dialysis, diabetes, COPD, O2 dependent 5 L, GERD.  Patient arrives today by EMS for concern of infection and altered mental status.  History obtained from RN shows that patient with axillary temperature 100.2 F by EMS, noted to be 88% on her normal 5 L nasal cannula.  She was given 5 mg albuterol and SPO2 increased to 95% on her normal supplemental oxygen.  Patient with bilateral pressure ulcers and groin ulcer as well.  Patient only oriented to self at this time when normally as some orientation to place and situation.  Patient received dialysis treatment yesterday.  Level 5 caveat altered mental status, dementia. HPI     Past Medical History:  Diagnosis Date  . Arthritis   . COPD (chronic obstructive pulmonary disease) ([place])   . Diabetic neuropathy ([place])   . 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 Renal disorder    ESRD  . Sleep apnea    wears BPAP at home at night  . Thyroid disease   . TIA (transient ischemic attack)   . Type 2 diabetes mellitus with peripheral neuropathy [facility])     Patient Active Problem List   Diagnosis Date Noted  . Major depression, recurrent, chronic ([place]) 07/14/2019  . Gastroesophageal reflux disease without esophagitis 07/14/2019  . History of CVA (cerebrovascular accident) 07/14/2019  . Hyponatremia 06/28/2019  . Candidal skin infection 06/28/2019  . UTI (urinary tract infection) 06/27/2019  . Unspecified protein-calorie malnutrition ([place]) 06/07/2019  . Pressure injury of skin 06/02/2019  . Urinary tract infection without hematuria 05/31/2019  . Symptomatic anemia 05/04/2019  . Neurocognitive deficits 04/27/2019  . Adult failure to thrive   . Encephalopathy, unspecified  04/20/2019  . Stroke ([place]) 04/20/2019  . Dysphagia 03/29/2019  . Aspiration into airway   . COVID-19 02/08/2019  . Hypotension 12/24/2018  . Aspiration pneumonia of right lower lobe ([place])   . COPD (chronic obstructive pulmonary disease) ([place]) 11/11/2018  . TIA (transient ischemic attack)   . HLD (hyperlipidemia)   . Type II diabetes mellitus with renal manifestations ([place])   . Depression with anxiety   . Advanced care planning/counseling discussion   . Goals of care, counseling/discussion   . Palliative care by specialist   . Chronic respiratory failure ([place]) 09/08/2018  . Debility   . Chronic respiratory failure with hypoxia, on home O2 therapy ([place]) 09/06/2018  . Nontraumatic subdural hemorrhage, unspecified ([place]) 08/22/2018  . Elevated troponin 08/22/2018  . ESRD on dialysis ([place]) 08/22/2018  . Anxiety 07/04/2018  . Counseling regarding advanced directives and goals of care 07/01/2018  . Full code status 07/01/2018  . Drug-induced constipation 07/01/2018  . Hemodialysis-associated hypotension 07/01/2018  . Dialysis patient ([place]) 07/01/2018  . HOH (hard of hearing) 07/01/2018  . Stage 5 chronic kidney disease on chronic dialysis ([place]) 07/01/2018  . Type 2 diabetes mellitus with diabetic polyneuropathy, with long-term current use of insulin ([place]) 07/01/2018  . Hypothyroidism 07/01/2018  . Gait instability 07/01/2018  . Hyperkalemia 04/20/2018  . Cerebral infarction, unspecified ([place]) 04/05/2018  . Chronic diastolic (congestive) heart failure ([place]) 04/05/2018  . Diarrhea, unspecified 04/05/2018  . Pain, unspecified 04/05/2018  . Pruritus, unspecified 04/05/2018  . Shortness of breath 04/05/2018  .  Coagulation defect, unspecified ([place]) 03/31/2018  . Anemia in chronic kidney disease 03/25/2018  . Iron deficiency anemia, unspecified 03/25/2018  . Secondary hyperparathyroidism of renal origin ([place]) 03/25/2018    Past Surgical History:  Procedure Laterality Date  . ABDOMINAL  HYSTERECTOMY    . ENDOSCOPIC RETROGRADE CHOLANGIOPANCREATOGRAPHY (ERCP) WITH PROPOFOL N/A 06/02/2019   Procedure: ENDOSCOPIC RETROGRADE CHOLANGIOPANCREATOGRAPHY (ERCP) WITH PROPOFOL;  Surgeon: [name], MD;  Location: [facility];  Service: Gastroenterology;  Laterality: N/A;  . IR FLUORO GUIDE CV LINE RIGHT  11/14/2018  . RECTOCELE REPAIR    . REMOVAL OF STONES  06/02/2019   Procedure: REMOVAL OF STONES;  Surgeon: [name], MD;  Location: [facility] ENDOSCOPY;  Service: Gastroenterology;;  . REPLACEMENT TOTAL KNEE BILATERAL Bilateral 2008  . SPHINCTEROTOMY  06/02/2019   Procedure: SPHINCTEROTOMY;  Surgeon: [name], MD;  Location: [facility] ENDOSCOPY;  Service: Gastroenterology;;  . THYROIDECTOMY     80%  . VAGINAL WOUND CLOSURE / REPAIR       OB History   No obstetric history on file.     Family History  Problem Relation Age of Onset  . Heart disease Mother   . Hypertension Mother   . Heart disease Father   . Hypertension Father     Social History   Tobacco Use  . Smoking status: Former Smoker    Packs/day: 1.00    Years: 35.00    Pack years: 35.00    Quit date: 03/29/1981    Years since quitting: 38.3  . Smokeless tobacco: Never Used  Vaping Use  . Vaping Use: Never used  Substance Use Topics  . Alcohol use: Never  . Drug use: Never    Home Medications Prior to Admission medications   Medication Sig Start Date End Date Taking? Authorizing Provider  acetaminophen (TYLENOL) 325 MG tablet Take 650 mg by mouth every 6 (six) hours as needed (for arthritic pain).     [provider]  atorvastatin (LIPITOR) 20 MG tablet Take 1 tablet (20 mg total) by mouth daily. 07/14/19   Nche, Charlene Brooke, NP  clonazePAM (KLONOPIN) 0.5 MG tablet Take 0.25 mg on Tues, Thurs & Sat only and 0.5 mg in the evening 07/14/19   Nche, Charlene Brooke, NP  collagenase (SANTYL) ointment Apply topically daily. 07/01/19   Geradine Girt, DO  Darbepoetin Alfa (ARANESP) 25 MCG/0.42ML SOSY  injection Inject 0.42 mLs (25 mcg total) into the vein every Tuesday with hemodialysis. 11/22/18   Hosie Poisson, MD  docusate sodium (COLACE) 100 MG capsule Take 100 mg by mouth every morning.    [provider]  doxercalciferol (HECTOROL) 4 MCG/2ML injection Inject 1.5 mLs (3 mcg total) into the vein Every Tuesday,Thursday,and Saturday with dialysis. 04/25/19   Hosie Poisson, MD  FLUoxetine (PROZAC) 10 MG tablet Take 1 tablet (10 mg total) by mouth daily. 07/14/19   Nche, Charlene Brooke, NP  furosemide (LASIX) 20 MG tablet Take 40 mg by mouth See admin instructions. Take 40 mg by mouth in the morning on non-dialysis days: Sun/Mon/Wed/Fri    [provider]  ipratropium-albuterol (DUONEB) 0.5-2.5 (3) MG/3ML SOLN Take 3 mLs by nebulization 4 (four) times daily.    [provider]  levalbuterol Penne Lash HFA) 45 MCG/ACT inhaler Inhale 2 puffs into the lungs every 4 (four) hours as needed for wheezing. 05/16/19   Medina-Vargas, Monina C, NP  levothyroxine (EUTHYROX) 112 MCG tablet Take 1 tablet (112 mcg total) by mouth daily before breakfast. 07/14/19   Nche,  Charlene Brooke, NP  Lubricants (K-Y LUBRICANT JELLY SENSITIVE EX) Place 1 application into both nostrils as needed (as directed- for lubrication).     [provider]  Methoxy PEG-Epoetin Beta (MIRCERA IJ) Mircera 07/13/19 07/11/20  [provider]  midodrine (PROAMATINE) 10 MG tablet Take 1 tablet (10 mg total) by mouth 3 (three) times daily. Patient taking differently: Take 10 mg by mouth 3 (three) times a week. Tues,Thur,Sat 45 min. Prior to  HD treatment 06/07/19   Thurnell Lose, MD  montelukast (SINGULAIR) 10 MG tablet Take 1 tablet (10 mg total) by mouth at bedtime. 07/14/19   Nche, Charlene Brooke, NP  NON FORMULARY Inhale into the lungs at bedtime. "BiPAP IPAP 18, EPAP 5, flow rate 5": At bedtime and during the day as needed for shortness of breath when napping    [provider]  nystatin  (MYCOSTATIN/NYSTOP) powder Apply 1 application topically 2 (two) times daily as needed (as directed- to any rashes). Patient taking differently: Apply 1 application topically 2 (two) times daily as needed (as directed, under skin folds and/or under the breasts).  05/16/19   Medina-Vargas, Monina C, NP  nystatin cream (MYCOSTATIN) Apply 1 application topically 2 (two) times daily as needed (as directed, under skin folds and/or under the breasts).    [provider]  OXYGEN Inhale 5-6 L/min into the lungs continuous.     [provider]  oxymetazoline (AFRIN) 0.05 % nasal spray Place 1 spray into both nostrils 2 (two) times daily as needed for congestion.    [provider]  pantoprazole (PROTONIX) 40 MG tablet Take 1 tablet (40 mg total) by mouth daily before breakfast. 07/14/19   Nche, Charlene Brooke, NP  SALINE NASAL SPRAY NA saline nasal spray 07/03/19   [provider]  sevelamer carbonate (RENVELA) 800 MG tablet Take 1 tablet (800 mg total) by mouth 3 (three) times daily with meals. Patient taking differently: Take 1,600 mg by mouth 3 (three) times daily with meals. And 1 tablet with a snack 05/16/19   Medina-Vargas, Monina C, NP  SIMPLY SALINE NA Place 2 sprays into both nostrils as needed (for congestion).     [provider]  trolamine salicylate (ASPER-FLEX) 10 % cream Apply 1 application topically as needed for muscle pain (to painful sites).    [provider]  heparin 5000 UNIT/ML injection Inject 1.5 mLs (7,500 Units total) into the skin every 8 (eight) hours. 02/22/19 04/20/19  Hosie Poisson, MD    Allergies    Avelox [moxifloxacin hcl in nacl], Codeine, Penicillins, Sulfa antibiotics, Dextromethorphan-guaifenesin, and Shellfish allergy  Review of Systems   Review of Systems  Unable to perform ROS: Dementia    Physical Exam Updated Vital Signs BP (!) 85/59   Pulse (!) 115   Temp 97.6 F (36.4 C) (Oral)   Resp (!) 23   Wt 84 kg    SpO2 91%   BMI 34.99 kg/m   Physical Exam Constitutional:      General: She is not in acute distress.    Appearance: Normal appearance. She is well-developed. She is obese. She is ill-appearing. She is not toxic-appearing or diaphoretic.  HENT:     Head: Normocephalic and atraumatic.     Right Ear: External ear normal.     Left Ear: External ear normal.     Nose: Nose normal.     Mouth/Throat:     Mouth: Mucous membranes are moist.     Pharynx: Oropharynx is clear.  Eyes:     General: Vision grossly intact. Gaze aligned appropriately.     Pupils: Pupils are equal, round, and reactive to light.  Neck:     Trachea: Trachea and phonation normal.  Cardiovascular:     Rate and Rhythm: Regular rhythm. Tachycardia present.  Pulmonary:     Effort: Pulmonary effort is normal. Tachypnea present. No accessory muscle usage or respiratory distress.     Breath sounds: Normal air entry.     Comments: Coarse breath sounds bilaterally Chest:     Chest wall: No tenderness.  Abdominal:     General: There is no distension.     Palpations: Abdomen is soft.     Tenderness: There is no abdominal tenderness. There is no guarding or rebound.  Musculoskeletal:        General: Normal range of motion.     Cervical back: Normal range of motion.     Comments: No midline C/T/L spinal tenderness to palpation, no paraspinal muscle tenderness, no deformity, crepitus, or step-off noted. No sign of injury to the neck or back.  No pain with palpation of the pelvis.  Patient is able to pull herself to a sitting position with minimal assistance.  Moves all 4 extremities spontaneously.  Skin:    General: Skin is warm and dry.          Comments: Bilateral hip pressure ulcers present, large with surrounding erythema left worse than right, necrotic debris and drainage is present. = Bilateral groin/upper inner thighs have draining erythematous wounds present appears that patient has been scratching the area and there  are linear superficial abrasions present.  Neurological:     Mental Status: She is alert.     GCS: GCS eye subscore is 4. GCS verbal subscore is 5. GCS motor subscore is 6.     Comments: Speech is clear, follows commands, alert to self and place Major Cranial nerves without deficit, no facial droop Moves extremities without ataxia, coordination intact  Psychiatric:        Behavior: Behavior normal.     ED Results / Procedures / Treatments   Labs (all labs ordered are listed, but only abnormal results are displayed) Labs Reviewed  LACTIC ACID, PLASMA - Abnormal; Notable for the following components:      Result Value   Lactic Acid, Venous 2.2 (*)    All other components within normal limits  COMPREHENSIVE METABOLIC PANEL - Abnormal; Notable for the following components:   Potassium 3.0 (*)    Chloride 94 (*)    Glucose, Bld 113 (*)    Creatinine, Ser 2.85 (*)    Total Protein 5.4 (*)    Albumin 1.9 (*)    Alkaline Phosphatase 212 (*)    Total Bilirubin 1.3 (*)    GFR calc non Af Amer 15 (*)    GFR calc Af Amer 18 (*)    Anion gap 17 (*)    All other components within normal limits  CBC WITH DIFFERENTIAL/PLATELET - Abnormal; Notable for the following components:   WBC 17.5 (*)    RBC 3.85 (*)    Hemoglobin 11.2 (*)    RDW 21.3 (*)    Neutro Abs 14.5 (*)    Monocytes Absolute 1.2 (*)    Abs Immature Granulocytes 0.17 (*)    All other components within normal limits  APTT - Abnormal; Notable for the following components:   aPTT 38 (*)    All other components within normal limits  CULTURE,  BLOOD (ROUTINE X 2)  CULTURE, BLOOD (ROUTINE X 2)  URINE CULTURE  SARS CORONAVIRUS 2 BY RT PCR (HOSPITAL ORDER, [place] [facility] [place] [facility])  PROTIME-INR  LACTIC ACID, PLASMA  URINALYSIS, ROUTINE W REFLEX MICROSCOPIC    EKG None  Radiology DG Chest Port 1 View  Result Date: 07/28/2019 CLINICAL DATA:  Sepsis, altered mental status, history COPD, diabetes mellitus EXAM:  PORTABLE CHEST 1 VIEW COMPARISON:  Portable exam 1145 hours compared to 06/29/2019 FINDINGS: RIGHT jugular line tip projecting over RIGHT atrium. Rotated to the RIGHT. Normal heart size, mediastinal contours, and pulmonary vascularity. Atherosclerotic calcification aorta. Bronchitic changes with bibasilar atelectasis and tiny RIGHT pleural effusion. Upper lungs clear. No pneumothorax or acute osseous findings. IMPRESSION: Bronchitic changes with bibasilar atelectasis and tiny RIGHT pleural effusion. Aortic Atherosclerosis (ICD10-I70.0). Electronically Signed   By: Lavonia [name] M.D.   On: 08/01/2019 12:14    Procedures .Critical Care Performed by: Deliah Boston, PA-C Authorized by: Deliah Boston, PA-C   Critical care provider statement:    Critical care time (minutes):  40   Critical care was necessary to treat or prevent imminent or life-threatening deterioration of the following conditions:  Sepsis   Critical care was time spent personally by me on the following activities:  Discussions with consultants, evaluation of patient's response to treatment, examination of patient, ordering and performing treatments and interventions, ordering and review of laboratory studies, ordering and review of radiographic studies, pulse oximetry, re-evaluation of patient's condition, obtaining history from patient or surrogate, review of old charts and development of treatment plan with patient or surrogate   (including critical care time)  Medications Ordered in ED Medications  vancomycin (VANCOREADY) IVPB 1750 mg/350 mL (1,750 mg Intravenous New Bag/Given 08/24/2019 1247)  metroNIDAZOLE (FLAGYL) IVPB 500 mg (500 mg Intravenous New Bag/Given 08/11/2019 1403)  potassium chloride 10 mEq in 100 mL IVPB (has no administration in time range)  sodium chloride 0.9 % bolus 500 mL (has no administration in time range)  cefTRIAXone (ROCEPHIN) 2 g in sodium chloride 0.9 % 100 mL IVPB (0 g Intravenous Stopped 08/12/2019 1240)   sodium chloride 0.9 % bolus 500 mL (0 mLs Intravenous Stopped 08/23/2019 1316)  fentaNYL (SUBLIMAZE) injection 50 mcg (50 mcg Intravenous Given 08/09/2019 1358)    ED Course  I have reviewed the triage vital signs and the nursing notes.  Pertinent labs & imaging results that were available during my care of the patient were reviewed by me and considered in my medical decision making (see chart for details).  Clinical Course as of Aug 01 1425  Wed Aug 02, 2019  1120 [phone] [facility] Phone) - No answer   [BM]  1[address], [place] (Son)  [phone] [facility])   [BM]  93 Dr. Lorin Mercy   [BM]    Clinical Course User Index [BM] Gari Crown   MDM Rules/Calculators/A&P                         Additional History Obtained: 1. Nursing notes from this visit. 2. Reviewed EMR, patient had an ED visit on 07/25/2019, diagnosis pressure injury of the right hip, unstageable; pressure injury of the sacral injury stage I; visit for wound check. 3. Family, spoke with patient's son and POA Eleesha Purkey over the phone multiple times during this visit.  Reports family has had difficult time arranging wound care for the patient.  [name] is a paramedic he is also concerned that patient has  not been wearing her CPAP properly at night, he advises that she often gets aspiration pneumonia requiring hospital admissions. ---------------- I ordered, reviewed and interpreted labs which include: CBC shows leukocytosis of 17.5 with left shift, hemoglobin of 11.2 appears baseline. CMP shows hypokalemia of 3.0, creatinine of 2.85 appears baseline for ESRD along with gap of 17. Lactic 2.2 APTT mildly elevated at 38 PT/INR within normal limits  CXR:  IMPRESSION:  Bronchitic changes with bibasilar atelectasis and tiny RIGHT pleural  effusion.    Aortic Atherosclerosis (ICD10-I70.0).  I personally reviewed patient's chest x-ray and agree with radiologist interpretation. - Patient is a code sepsis, she was started  on vancomycin and Rocephin empirically for concern of the cellulitis.  She has deep pressure ulcers of the bilateral hips with purulent drainage.  What appears new is the wounds of the bilateral groin and upper thighs, there are linear excoriations which appear that patient may have been scratching the area there is surrounding cellulitis and purulent drainage of these areas as well.  Patient not hypotensive on arrival and given she has ESRD she was started on a 500 mL fluid bolus.  She only reports pain at her pressure wounds, she has no chest pain, shortness of breath or abdominal pain on exam.  She was hypoxic at 88% upon EMS arrival earlier this appears to have resolved with albuterol, she has coarse lung sounds but remained stable on her normal 5 L nasal cannula here in the emergency department.  Chest x-ray shows right pleural effusion, given patient's history and pleural effusion Flagyl was added for coverage of possible aspiration pneumonia.  Patient's family was updated during the visit they are agreeable to admission.  Patient was reevaluated she is resting comfortably and in no acute distress.  I discussed the case with Dr. Lorin Mercy and patient was accepted to hospitalist service. - After admission patient had reading of hypotension, on examination she is alert, follows commands, her son is at bedside. Discussed with Dr. Wilson Singer, additional 500 mL fluid bolus added.  Repeat blood pressure systolic 98.  Patient admitted to hospital service.  Note: Portions of this report may have been transcribed using voice recognition software. Every effort was made to ensure accuracy; however, inadvertent computerized transcription errors may still be present. Final Clinical Impression(s) / ED Diagnoses Final diagnoses:  Sepsis without acute organ dysfunction, due to unspecified organism ([place])  Pressure injury of skin, unspecified injury stage, unspecified location  Pleural effusion    Rx / DC Orders ED  Discharge Orders    None       Gari Crown 08/21/2019 1441    Virgel Manifold, MD 08/03/19 [phone]

## 2019-08-02 NOTE — Progress Notes (Signed)
Pharmacy Antibiotic Note  Joan Mosley is a 74 y.o. female admitted on 08/06/2019 with cellulitis.  Pharmacy has been consulted for vancomycin dosing. Pt is afebrile but WBC is elevated at 17.5. Pt with history of ESRD on HD. Lactic acid is elevated.   Plan: Vancomycin 1750mg  IV x 1 then 1gm IV QHD F/u renal plans, C&S, clinical status and pre-HD vanc level PRN F/u continuation of gram neg coverage  Weight: 84 kg (185 lb 3 oz)  Temp (24hrs), Avg:97.6 F (36.4 C), Min:97.6 F (36.4 C), Max:97.6 F (36.4 C)  Recent Labs  Lab 08/17/2019 1207  WBC 17.5*  CREATININE 2.85*  LATICACIDVEN 2.2*    Estimated Creatinine Clearance: 16.5 mL/min (A) (by C-G formula based on SCr of 2.85 mg/dL (H)).    Allergies  Allergen Reactions  . Avelox [Moxifloxacin Hcl In Nacl] Other (See Comments)    Unknown reaction  . Codeine Anaphylaxis  . Penicillins Rash    Did it involve swelling of the face/tongue/throat, SOB, or low BP? Unk Did it involve sudden or severe rash/hives, skin peeling, or any reaction on the inside of your mouth or nose? Yes Did you need to seek medical attention at a hospital or doctor's office? Unk When did it last happen? Unk If all above answers are "NO", may proceed with cephalosporin use.   . Sulfa Antibiotics Other (See Comments)    Unknown reaction  . Dextromethorphan-Guaifenesin Other (See Comments)    Reported by [facility] - unknown reaction. Pt states she is not allergic to dextromethorphan-guaifenesin.  . Shellfish Allergy Hives, Itching and Rash    Antimicrobials this admission: Vanc 7/7>> CTX x 1 7/7 Flagyl x 1 7/7  Dose adjustments this admission: N/A  Microbiology results: Pending  Thank you for allowing pharmacy to be a part of this patient's care.  Alesha Jaffee, Rande Lawman 08/16/2019 11:42 AM

## 2019-08-02 NOTE — H&P (Signed)
History and Physical    Joan Mosley DVV:[id number] DOB: 17-Aug-1945 DOA: 08/06/2019  PCP: Flossie Buffy, NP Consultants:  Nephrology  Patient coming from:  Home - lives with daughter, son, grandson; NOK: Hershal Coria, [place]  Chief Complaint: AMS  HPI: Joan Mosley is a 74 y.o. female with medical history significant of TIA; COPD on home 5L O2; ESRD on TTS HD; dementia; morbid obesity (BMI 81); and DM presenting with AMS.  Her son reports that she came in for "all those bedsores".  She was here recently, trying to get home wound care but they aren't available until August.  Her wounds are continuing to get worse.  No change in her mental status, she is "extremely forgetful" at baseline.  No fever.  She does TTS HD and needs HD tomorrow.     ED Course: Confusion, fever, code sepsis.  Temp 100.2 axillary, 88% on usual 5L  O2.  +leukocytosis.  Source may be draining ulcers on hips and groin.  Given Vanc/Rocephin.  CXR with small R pleural effusion, often gets aspiration PNA.  Added Flagyl to cover.  96% on usual 5L home O2.  Review of Systems: Unable to perform  Ambulatory Status:  Wheelchair dependent, unable to transfer  COVID Vaccine Status:  None  Past Medical History:  Diagnosis Date  . Arthritis   . COPD (chronic obstructive pulmonary disease) ([place])   . Diabetic neuropathy ([place])   . ESRD on dialysis [facility])    ESRD  . 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 Sleep apnea    wears BPAP at home at night  . Thyroid disease   . TIA (transient ischemic attack)   . Type 2 diabetes mellitus with peripheral neuropathy [facility])     Past Surgical History:  Procedure Laterality Date  . ABDOMINAL HYSTERECTOMY    . ENDOSCOPIC RETROGRADE CHOLANGIOPANCREATOGRAPHY (ERCP) WITH PROPOFOL N/A 06/02/2019   Procedure: ENDOSCOPIC RETROGRADE CHOLANGIOPANCREATOGRAPHY (ERCP) WITH PROPOFOL;  Surgeon: [name], MD;  Location: [facility];  Service: Gastroenterology;  Laterality:  N/A;  . IR FLUORO GUIDE CV LINE RIGHT  11/14/2018  . RECTOCELE REPAIR    . REMOVAL OF STONES  06/02/2019   Procedure: REMOVAL OF STONES;  Surgeon: [name], MD;  Location[facility] ENDOSCOPY;  Service: Gastroenterology;;  . REPLACEMENT TOTAL KNEE BILATERAL Bilateral 2008  . SPHINCTEROTOMY  06/02/2019   Procedure: SPHINCTEROTOMY;  Surgeon: [name], MD;  Location[facility] ENDOSCOPY;  Service: Gastroenterology;;  . THYROIDECTOMY     80%  . VAGINAL WOUND CLOSURE / REPAIR      Social History   Socioeconomic History  . Marital status: Widowed    Spouse name: Not on file  . Number of children: 5  . Years of education: Not on file  . Highest education level: Not on file  Occupational History    Comment: disabled  Tobacco Use  . Smoking status: Former Smoker    Packs/day: 1.00    Years: 35.00    Pack years: 35.00    Quit date: 03/29/1981    Years since quitting: 38.3  . Smokeless tobacco: Never Used  Vaping Use  . Vaping Use: Never used  Substance and Sexual Activity  . Alcohol use: Never  . Drug use: Never  . Sexual activity: Not Currently  Other Topics Concern  . Not on file  Social History Narrative  . Not on file   Social Determinants of Health   Financial Resource Strain:   .  Difficulty of Paying Living Expenses:   Food Insecurity:   . Worried About Charity fundraiser in the Last Year:   . Arboriculturist in the Last Year:   Transportation Needs:   . Film/video editor (Medical):   Marland Kitchen Lack of Transportation (Non-Medical):   Physical Activity:   . Days of Exercise per Week:   . Minutes of Exercise per Session:   Stress:   . Feeling of Stress :   Social Connections:   . Frequency of Communication with Friends and Family:   . Frequency of Social Gatherings with Friends and Family:   . Attends Religious Services:   . Active Member of Clubs or Organizations:   . Attends Archivist Meetings:   Marland Kitchen Marital Status:   Intimate Partner Violence:   . Fear  of Current or Ex-Partner:   . Emotionally Abused:   Marland Kitchen Physically Abused:   . Sexually Abused:     Allergies  Allergen Reactions  . Avelox [Moxifloxacin Hcl In Nacl] Other (See Comments)    Unknown reaction  . Codeine Anaphylaxis  . Penicillins Rash    Did it involve swelling of the face/tongue/throat, SOB, or low BP? Unk Did it involve sudden or severe rash/hives, skin peeling, or any reaction on the inside of your mouth or nose? Yes Did you need to seek medical attention at a hospital or doctor's office? Unk When did it last happen? Unk If all above answers are "NO", may proceed with cephalosporin use.   . Sulfa Antibiotics Other (See Comments)    Unknown reaction  . Dextromethorphan-Guaifenesin Other (See Comments)    Reported by [facility] - unknown reaction. Pt states she is not allergic to dextromethorphan-guaifenesin.  . Shellfish Allergy Hives, Itching and Rash    Family History  Problem Relation Age of Onset  . Heart disease Mother   . Hypertension Mother   . Heart disease Father   . Hypertension Father     Prior to Admission medications   Medication Sig Start Date End Date Taking? Authorizing Provider  acetaminophen (TYLENOL) 325 MG tablet Take 650 mg by mouth every 6 (six) hours as needed (for arthritic pain).     [provider]  atorvastatin (LIPITOR) 20 MG tablet Take 1 tablet (20 mg total) by mouth daily. 07/14/19   Nche, Charlene Brooke, NP  clonazePAM (KLONOPIN) 0.5 MG tablet Take 0.25 mg on Tues, Thurs & Sat only and 0.5 mg in the evening 07/14/19   Nche, Charlene Brooke, NP  collagenase (SANTYL) ointment Apply topically daily. 07/01/19   Geradine Girt, DO  Darbepoetin Alfa (ARANESP) 25 MCG/0.42ML SOSY injection Inject 0.42 mLs (25 mcg total) into the vein every Tuesday with hemodialysis. 11/22/18   Hosie Poisson, MD  docusate sodium (COLACE) 100 MG capsule Take 100 mg by mouth every morning.    [provider]  doxercalciferol (HECTOROL) 4 MCG/2ML  injection Inject 1.5 mLs (3 mcg total) into the vein Every Tuesday,Thursday,and Saturday with dialysis. 04/25/19   Hosie Poisson, MD  FLUoxetine (PROZAC) 10 MG tablet Take 1 tablet (10 mg total) by mouth daily. 07/14/19   Nche, Charlene Brooke, NP  furosemide (LASIX) 20 MG tablet Take 40 mg by mouth See admin instructions. Take 40 mg by mouth in the morning on non-dialysis days: Sun/Mon/Wed/Fri    [provider]  ipratropium-albuterol (DUONEB) 0.5-2.5 (3) MG/3ML SOLN Take 3 mLs by nebulization 4 (four) times daily.    [provider]  levalbuterol Penne Lash  HFA) 45 MCG/ACT inhaler Inhale 2 puffs into the lungs every 4 (four) hours as needed for wheezing. 05/16/19   Medina-Vargas, Monina C, NP  levothyroxine (EUTHYROX) 112 MCG tablet Take 1 tablet (112 mcg total) by mouth daily before breakfast. 07/14/19   Nche, Charlene Brooke, NP  Lubricants (K-Y LUBRICANT JELLY SENSITIVE EX) Place 1 application into both nostrils as needed (as directed- for lubrication).     [provider]  Methoxy PEG-Epoetin Beta (MIRCERA IJ) Mircera 07/13/19 07/11/20  [provider]  midodrine (PROAMATINE) 10 MG tablet Take 1 tablet (10 mg total) by mouth 3 (three) times daily. Patient taking differently: Take 10 mg by mouth 3 (three) times a week. Tues,Thur,Sat 45 min. Prior to  HD treatment 06/07/19   Thurnell Lose, MD  montelukast (SINGULAIR) 10 MG tablet Take 1 tablet (10 mg total) by mouth at bedtime. 07/14/19   Nche, Charlene Brooke, NP  NON FORMULARY Inhale into the lungs at bedtime. "BiPAP IPAP 18, EPAP 5, flow rate 5": At bedtime and during the day as needed for shortness of breath when napping    [provider]  nystatin (MYCOSTATIN/NYSTOP) powder Apply 1 application topically 2 (two) times daily as needed (as directed- to any rashes). Patient taking differently: Apply 1 application topically 2 (two) times daily as needed (as directed, under skin folds and/or under the breasts).   05/16/19   Medina-Vargas, Monina C, NP  nystatin cream (MYCOSTATIN) Apply 1 application topically 2 (two) times daily as needed (as directed, under skin folds and/or under the breasts).    [provider]  OXYGEN Inhale 5-6 L/min into the lungs continuous.     [provider]  oxymetazoline (AFRIN) 0.05 % nasal spray Place 1 spray into both nostrils 2 (two) times daily as needed for congestion.    [provider]  pantoprazole (PROTONIX) 40 MG tablet Take 1 tablet (40 mg total) by mouth daily before breakfast. 07/14/19   Nche, Charlene Brooke, NP  SALINE NASAL SPRAY NA saline nasal spray 07/03/19   [provider]  sevelamer carbonate (RENVELA) 800 MG tablet Take 1 tablet (800 mg total) by mouth 3 (three) times daily with meals. Patient taking differently: Take 1,600 mg by mouth 3 (three) times daily with meals. And 1 tablet with a snack 05/16/19   Medina-Vargas, Monina C, NP  SIMPLY SALINE NA Place 2 sprays into both nostrils as needed (for congestion).     [provider]  trolamine salicylate (ASPER-FLEX) 10 % cream Apply 1 application topically as needed for muscle pain (to painful sites).    [provider]  heparin 5000 UNIT/ML injection Inject 1.5 mLs (7,500 Units total) into the skin every 8 (eight) hours. 02/22/19 04/20/19  Hosie Poisson, MD    Physical Exam: Vitals:   07/30/2019 1631 08/21/2019 1701 08/11/2019 1713 08/07/2019 1743  BP:  (!) 81/63 93/60 (!) 95/56  Pulse:  (!) 110 (!) 109 (!) 111  Resp:  (!) 27 (!) 29 (!) 28  Temp:      TempSrc:      SpO2: 92% 93% 96% 97%  Weight:         . General:  Appears calm and comfortable, somewhat somnolent - son reports she is at baseline mental status . Eyes:  PERRL, EOMI, normal lids, iris . ENT:  grossly normal hearing, lips & tongue, mmm . Neck:  no LAD, masses or thyromegaly . Cardiovascular:  RRR, no m/r/g. No LE edema.  Marland Kitchen Respiratory:  CTA bilaterally with no wheezes/rales/rhonchi.  Normal  respiratory effort. . Abdomen:  soft, NT, ND, NABS . Skin: extremely foul-smelling wounds with purulent drainage underneath her pannus/along the groin as well as on hips and sacrum (those were dressed and so not uncovered)     . Musculoskeletal:  Decreased tone BUE/BLE, no bony abnormality . Psychiatric: somnolent mood and affect, speech sparse and somewhat appropriate . Neurologic:  Unable to perform    Radiological Exams on Admission: DG Chest Port 1 View  Result Date: 08/25/2019 CLINICAL DATA:  Sepsis, altered mental status, history COPD, diabetes mellitus EXAM: PORTABLE CHEST 1 VIEW COMPARISON:  Portable exam 1145 hours compared to 06/29/2019 FINDINGS: RIGHT jugular line tip projecting over RIGHT atrium. Rotated to the RIGHT. Normal heart size, mediastinal contours, and pulmonary vascularity. Atherosclerotic calcification aorta. Bronchitic changes with bibasilar atelectasis and tiny RIGHT pleural effusion. Upper lungs clear. No pneumothorax or acute osseous findings. IMPRESSION: Bronchitic changes with bibasilar atelectasis and tiny RIGHT pleural effusion. Aortic Atherosclerosis (ICD10-I70.0). Electronically Signed   By: Lavonia [name] M.D.   On: 08/01/2019 12:14    EKG: Independently reviewed.  Sinus tachycardia with rate 116; LAFB; no evidence of acute ischemia   Labs on Admission: I have personally reviewed the available labs and imaging studies at the time of the admission.  Pertinent labs:   K+ 3.0 Glucose 113 BUN 18/Creatinine 2.85/GFR 18 Anion gap 17 AP 212 Albumin 1.9 Lactate 2.2 WBC 17.5 Hgb 11.2 INR 1.0   Assessment/Plan Principal Problem:   Sepsis due to cellulitis [facility]) Active Problems:   Hypothyroidism   ESRD on dialysis ([place])   Chronic respiratory failure with hypoxia, on home O2 therapy ([place])   Advanced care planning/counseling discussion   Type II diabetes mellitus with renal manifestations (HCC)   COPD (chronic obstructive pulmonary disease) (HCC)    Hypotension   Anemia in chronic kidney disease   Neurocognitive deficits    Sepsis due to wound infection -SIRS criteria in this patient includes: Leukocytosis, tachycardia, tachypnea  -Patient has evidence of acute organ failure with elevated lactate >2 that is not easily explained by another condition. -While awaiting blood cultures, this appears to be a preseptic condition. -Sepsis protocol initiated -Suspected source is wounds/purulent cellulitis -Blood and urine cultures pending -Will admit to Progressive Care unit due to: hemodynamic instability -Treat with IV Rocephin/Vanc as per the cellulitis order set -Will trend lactate to ensure improvement -Will order sepsis protocol procalcitonin level.  Antibiotics would not be indicated for PCT <0.1 and probably should not be used for < 0.25.  >0.5 indicates infection and >>0.5 indicates more serious disease.  As the procalcitonin level normalizes, it will be reasonable to consider de-escalation of antibiotic coverage. -Wound care consultation for assistance in managing wounds; unfortunately, they are in dependent areas and will be difficult to eradicate  Chronic respiratory failure associated with COPD -Patient is on 5L home O2 and uses BIPAP qhs -She reports no significant change in her baseline COPD at this time -She was on increased O2 requirement on presentation but is back to baseline at this time -Continue Duoneb QID and Xopenex prn -Continue Singulair  ESRD on HD -Patient on chronic TTS HD -Nephrology prn order set utilized -She does not appear to be volume overloaded or otherwise in need of acute HD -Nephrology is aware that patient will need HD tomorrow -Continue Renvela -Continue Aranesp, Hectorol for CKD-associated anemia  Hypotension -May be associated with HD vs. Sepsis -She has been fluid-responsive in the ER -Hold  Lasix -Continue to bolus as needed -Continue Midodrine on HD days but add an additional dose  now  HLD -Continue Lipitor  Dementia -Per son, this is at/near baseline -Continue Klonopin, Prozac  Goals of care -Patient has severe COPD and ESRD at baseline -Now with dementia -Progressive weakness, wheelchair-dependent, does not leave the bed -As such, it seems reasonable to address goals of care -Previously made DNR, will continue -It may be reasonable to consider hospice enrollment  DM -No longer on insulin -Tight glycemic control does not appear to be indicated at this time  Hypothyroidism -Continue Synthroid at current dose   Obesity -Body mass index is 34.99 kg/m. -She is immobile and this appears unlikely to improve    Note: This patient has been tested and is pending for the novel coronavirus COVID-19.   DVT prophylaxis:  Heparin Code Status:  DNR - confirmed with ACP documents/family Family Communication: Son present throughout evaluation  Disposition Plan:  The patient is from: home  Anticipated d/c is to: home without [facility] services, although Hospice may be appropriate  Anticipated d/c date will depend on clinical response to treatment, likely several days  Patient is currently: acutely ill Consults called: Nephrology; Palliative care; Wound care Admission status: Admit - It is my clinical opinion that admission to INPATIENT is reasonable and necessary because of the expectation that this patient will require hospital care that crosses at least 2 midnights to treat this condition based on the medical complexity of the problems presented.  Given the aforementioned information, the predictability of an adverse outcome is felt to be significant.    Karmen Bongo MD [facility]   How to contact the [facility] Attending or Consulting provider [place] or covering provider during after hours [place], for this patient?  1. Check the care team in [facility] and look for a) attending/consulting TRH provider listed and b) the [facility] team listed 2. Log into  [email or web address] and use [place]'s universal password to access. If you do not have the password, please contact the hospital operator. 3. Locate the [facility] provider you are looking for under [facility] and page to a number that you can be directly reached. 4. If you still have difficulty reaching the provider, please page the [facility] (Director on Call) for the Hospitalists listed on amion for assistance.   08/17/2019, 6:18 PM

## 2019-08-02 NOTE — Telephone Encounter (Signed)
[name] called and wanted to speak to someone regarding patient. Nurse stated that she sent patient to the ER for abnormal vital signs and patient is being transporting to [facility]. CB is [phone]

## 2019-08-02 NOTE — Progress Notes (Signed)
Notified bedside nurse of need to draw repeat lactic acid. 
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
## 2019-08-02 NOTE — ED Notes (Signed)
SDU  [name] son [phone] would like an update on pt

## 2019-08-02 NOTE — ED Notes (Addendum)
Admitting paged

## 2019-08-02 NOTE — Telephone Encounter (Signed)
Joan Mosley.  [name] was contacted from home health she said at her visit today pt's vitals was abnormal. Her BP was 102/68, her heart rate was 125 and her temp under her arm was 100.2, her 02 was at a 87% before EMS came they did do a nebulizer and got it up to 93%. [name] that she did ask for the pt to be transported to [place] said the wound looks worse and believes it could be septic, she stated she couldn't believe she wasn't on antibiotics for this per last hospital visit.

## 2019-08-02 NOTE — ED Notes (Signed)
RT called. Will be at bedside shortly

## 2019-08-03 ENCOUNTER — Inpatient Hospital Stay (HOSPITAL_COMMUNITY): Payer: Medicare Other

## 2019-08-03 DIAGNOSIS — E1122 Type 2 diabetes mellitus with diabetic chronic kidney disease: Secondary | ICD-10-CM

## 2019-08-03 DIAGNOSIS — E890 Postprocedural hypothyroidism: Secondary | ICD-10-CM

## 2019-08-03 DIAGNOSIS — R6521 Severe sepsis with septic shock: Secondary | ICD-10-CM

## 2019-08-03 DIAGNOSIS — Z794 Long term (current) use of insulin: Secondary | ICD-10-CM

## 2019-08-03 DIAGNOSIS — Z515 Encounter for palliative care: Secondary | ICD-10-CM

## 2019-08-03 DIAGNOSIS — Z992 Dependence on renal dialysis: Secondary | ICD-10-CM

## 2019-08-03 DIAGNOSIS — Z7189 Other specified counseling: Secondary | ICD-10-CM

## 2019-08-03 DIAGNOSIS — N186 End stage renal disease: Secondary | ICD-10-CM

## 2019-08-03 DIAGNOSIS — Z9981 Dependence on supplemental oxygen: Secondary | ICD-10-CM

## 2019-08-03 DIAGNOSIS — I953 Hypotension of hemodialysis: Secondary | ICD-10-CM

## 2019-08-03 DIAGNOSIS — J9611 Chronic respiratory failure with hypoxia: Secondary | ICD-10-CM

## 2019-08-03 DIAGNOSIS — Z66 Do not resuscitate: Secondary | ICD-10-CM

## 2019-08-03 DIAGNOSIS — J410 Simple chronic bronchitis: Secondary | ICD-10-CM

## 2019-08-03 HISTORY — PX: IR FLUORO GUIDE CV LINE RIGHT: IMG2283

## 2019-08-03 HISTORY — PX: IR US GUIDE VASC ACCESS RIGHT: IMG2390

## 2019-08-03 LAB — CBC
HCT: 30.9 % — ABNORMAL LOW (ref 36.0–46.0)
Hemoglobin: 9.4 g/dL — ABNORMAL LOW (ref 12.0–15.0)
MCH: 29.3 pg (ref 26.0–34.0)
MCHC: 30.4 g/dL (ref 30.0–36.0)
MCV: 96.3 fL (ref 80.0–100.0)
Platelets: 263 10*3/uL (ref 150–400)
RBC: 3.21 MIL/uL — ABNORMAL LOW (ref 3.87–5.11)
RDW: 21.3 % — ABNORMAL HIGH (ref 11.5–15.5)
WBC: 17.8 10*3/uL — ABNORMAL HIGH (ref 4.0–10.5)
nRBC: 0 % (ref 0.0–0.2)

## 2019-08-03 LAB — GLUCOSE, CAPILLARY: Glucose-Capillary: 88 mg/dL (ref 70–99)

## 2019-08-03 LAB — PROCALCITONIN
Procalcitonin: >150 ng/mL
Procalcitonin: >150 ng/mL

## 2019-08-03 LAB — BASIC METABOLIC PANEL
Anion gap: 16 — ABNORMAL HIGH (ref 5–15)
BUN: 23 mg/dL (ref 8–23)
CO2: 24 mmol/L (ref 22–32)
Calcium: 9.1 mg/dL (ref 8.9–10.3)
Chloride: 95 mmol/L — ABNORMAL LOW (ref 98–111)
Creatinine, Ser: 3.36 mg/dL — ABNORMAL HIGH (ref 0.44–1.00)
GFR calc Af Amer: 15 mL/min — ABNORMAL LOW (ref >60)
GFR calc non Af Amer: 13 mL/min — ABNORMAL LOW (ref >60)
Glucose, Bld: 88 mg/dL (ref 70–99)
Potassium: 3.6 mmol/L (ref 3.5–5.1)
Sodium: 135 mmol/L (ref 135–145)

## 2019-08-03 LAB — LACTIC ACID, PLASMA
Lactic Acid, Venous: 3.8 mmol/L (ref 0.5–1.9)
Lactic Acid, Venous: 1.2 mmol/L (ref 0.5–1.9)

## 2019-08-03 MED ORDER — LIDOCAINE HCL 1 % IJ SOLN
INTRAMUSCULAR | Status: DC | PRN
  Administered 2019-08-03: 10 mL

## 2019-08-03 MED ORDER — COLLAGENASE 250 UNIT/GM EX OINT
TOPICAL_OINTMENT | Freq: Every day | CUTANEOUS | Status: DC
  Administered 2019-08-04: 1 via TOPICAL
  Filled 2019-08-03 (×3): qty 30

## 2019-08-03 MED ORDER — IPRATROPIUM-ALBUTEROL 0.5-2.5 (3) MG/3ML IN SOLN: 3.0000 mL | RESPIRATORY_TRACT | Status: DC | PRN

## 2019-08-03 MED ORDER — LIDOCAINE HCL 1 % IJ SOLN
INTRAMUSCULAR | Status: AC
  Filled 2019-08-03: qty 20

## 2019-08-03 MED ORDER — CHLORHEXIDINE GLUCONATE CLOTH 2 % EX PADS
6.0000 | MEDICATED_PAD | Freq: Every day | CUTANEOUS | Status: DC
  Administered 2019-08-03 – 2019-08-04 (×2): 6 via TOPICAL

## 2019-08-03 MED ORDER — LACTATED RINGERS IV BOLUS
250.0000 mL | Freq: Once | INTRAVENOUS | Status: AC
  Administered 2019-08-03: 250 mL via INTRAVENOUS

## 2019-08-03 NOTE — Procedures (Signed)
Interventional Radiology Procedure:   Indications: Poor venous access  Procedure: Central line placement  Findings: Left jugular central line, tip at SVC/RA junction  Complications: None     EBL: less than 10 ml  Plan: Central line is ready to use.    Jarely Juncaj R. Anselm Pancoast, MD  Pager: [phone]

## 2019-08-03 NOTE — Progress Notes (Signed)
Foley catheter placed. Patient tolerated okay. No urine return.

## 2019-08-03 NOTE — Consult Note (Signed)
Consultation Note Date: 08/03/2019   Patient Name: Joan Mosley  DOB: [birth year]  MRN: [id number]  Age / Sex: 74 y.o., female  PCP: Nche, Charlene Brooke, NP Referring Physician: Jonnie Finner, DO  Reason for Consultation: Establishing goals of care  HPI/Patient Profile: 74 y.o. female  with past medical history of TIA, COPD on 5L oxygen at home, ESRD on HD, dementia, and DM admitted on 07/29/2019 with AMS. Found to have multiple wounds. Diagnosed with sepsis likely d/t draining ulcers on hips and groin.  Night of 7/7 patient more hypoxic - ABG found PaO2 of 47. Patient also with tachycardia. No acute disease on CXR so CT angiogram ordered - this has been delayed d/t IV access. PMT consulted for [place].  Patient is well known to PMT - we have seen multiple times during previous hospitalizations.  Clinical Assessment and Goals of Care: I have reviewed medical records including EPIC notes, labs and imaging, received report from RN, assessed the patient and then spoke with patient's son, Joan Mosley, to discuss diagnosis prognosis, [place], EOL wishes, disposition and options.  I attempted to speak with Joan Mosley; however, she was too lethargic to participate in conversation.   Joan Mosley is documented HCPOA; he is also a paramedic. He is very familiar with palliative team d/t multiple conversations during previous conversations.   Joan Mosley tells me his mother has had a functional decline since her last hospitalization. She is essentially bed bound - she is lifted to recliner during the day. Otherwise, he feels she has been "doing okay". He reports that she has tolerated dialysis well. He does note that she has been complaining of more discomfort over the past couple of weeks - unable to get in a comfortable position.    We discussed patient's current illness and what it means in the larger context of patient's on-going co-morbidities.  Natural disease trajectory and  expectations at EOL were discussed. I shared with Joan Mosley the events overnight and results of ABG.   Joan Mosley brings up code status - he tells me the patient had rescinded DNR during previous hospitalization. Encouraged Joan Mosley to consider DNR/DNI status understanding evidenced based poor outcomes in similar hospitalized patients, as the cause of the arrest is likely associated with chronic/terminal disease rather than a reversible acute cardio-pulmonary event. Joan Mosley agrees that DNR is appropriate. We discuss that at this time patient is unable to make her own medical decisions so Joan Mosley is her decisions maker as her documented HCPOA.   Discussed with Joan Mosley the importance of continued conversation with family and the medical providers regarding overall plan of care and treatment options, ensuring decisions are within the context of the patient's values and GOCs.    Joan Mosley would like to see how patient does in coming days - see how she respond to antibiotics then readdress [place] as appropriate. For now, continue current measures.   Joan Mosley is interested in patient returning home health home health - specifically [place].  Questions and concerns were addressed. The family was encouraged to call with questions or concerns.   Primary Decision Maker HCPOA - son Joan Mosley   SUMMARY OF RECOMMENDATIONS   - DNR per son and HCPOA - continue current measures and allow time for outcomes - not eligible for hospice at this time as family is interested in continuing current measures including HD - PMT will shadow chart, will follow up with son depending on clinical condition  Code Status/Advance Care Planning:  DNR  Prognosis:   Unable to determine  Discharge Planning: Home with Home Health ? Depends on clinical course - family interested in [place]     Primary Diagnoses: Present on Admission: . (Resolved) Sepsis ([name]) . Sepsis due to cellulitis ([place]) . Anemia in chronic kidney disease . COPD (chronic obstructive  pulmonary disease) ([place]) . Hypothyroidism . Hypotension . Neurocognitive deficits . Type II diabetes mellitus with renal manifestations ([place])   I have reviewed the medical record, interviewed the patient and family, and examined the patient. The following aspects are pertinent.  Past Medical History:  Diagnosis Date  . Arthritis   . COPD (chronic obstructive pulmonary disease) ([place])   . Diabetic neuropathy ([place])   . ESRD on dialysis [facility])    ESRD  . 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 Sleep apnea    wears BPAP at home at night  . Thyroid disease   . TIA (transient ischemic attack)   . Type 2 diabetes mellitus with peripheral neuropathy (HCC)    Social History   Socioeconomic History  . Marital status: Widowed    Spouse name: Not on file  . Number of children: 5  . Years of education: Not on file  . Highest education level: Not on file  Occupational History    Comment: disabled  Tobacco Use  . Smoking status: Former Smoker    Packs/day: 1.00    Years: 35.00    Pack years: 35.00    Quit date: 03/29/1981    Years since quitting: 38.3  . Smokeless tobacco: Never Used  Vaping Use  . Vaping Use: Never used  Substance and Sexual Activity  . Alcohol use: Never  . Drug use: Never  . Sexual activity: Not Currently  Other Topics Concern  . Not on file  Social History Narrative  . Not on file   Social Determinants of Health   Financial Resource Strain:   . Difficulty of Paying Living Expenses:   Food Insecurity:   . Worried About Charity fundraiser in the Last Year:   . Arboriculturist in the Last Year:   Transportation Needs:   . Film/video editor (Medical):   Marland Kitchen Lack of Transportation (Non-Medical):   Physical Activity:   . Days of Exercise per Week:   . Minutes of Exercise per Session:   Stress:   . Feeling of Stress :   Social Connections:   . Frequency of Communication with Friends and Family:   . Frequency of Social Gatherings with Friends  and Family:   . Attends Religious Services:   . Active Member of Clubs or Organizations:   . Attends Archivist Meetings:   Marland Kitchen Marital Status:    Family History  Problem Relation Age of Onset  . Heart disease Mother   . Hypertension Mother   . Heart disease Father   . Hypertension Father    Scheduled Meds: . atorvastatin  20 mg Oral Daily  . Chlorhexidine Gluconate Cloth  6 each Topical Daily  . clonazePAM  0.25 mg Oral Q T,Th,Sa-HD  . clonazePAM  0.5 mg Oral QHS  . collagenase   Topical Daily  . [START ON 2019-09-06] Darbepoetin Alfa  25 mcg Intravenous Q Tue-HD  . doxercalciferol  3 mcg Intravenous Q T,Th,Sa-HD  . FLUoxetine  10 mg Oral Daily  . heparin  5,000 Units Subcutaneous Q8H  . levothyroxine  112 mcg Oral QAC breakfast  . midodrine  10 mg Oral Once per day on Tue Thu Sat  .  montelukast  10 mg Oral QHS  . pantoprazole  40 mg Oral QAC breakfast  . sevelamer carbonate  1,600 mg Oral TID WC  . sodium chloride flush  3 mL Intravenous Q12H   Continuous Infusions: . cefTRIAXone (ROCEPHIN)  IV    . vancomycin     PRN Meds:.acetaminophen **OR** acetaminophen, calcium carbonate (dosed in mg elemental calcium), camphor-menthol **AND** hydrOXYzine, docusate sodium, feeding supplement (NEPRO CARB STEADY), ipratropium-albuterol, levalbuterol, ondansetron **OR** ondansetron (ZOFRAN) IV, sevelamer carbonate, sorbitol, zolpidem Allergies  Allergen Reactions  . Avelox [Moxifloxacin Hcl In Nacl] Other (See Comments)    Unknown reaction  . Codeine Anaphylaxis  . Penicillins Rash    Did it involve swelling of the face/tongue/throat, SOB, or low BP? Unk Did it involve sudden or severe rash/hives, skin peeling, or any reaction on the inside of your mouth or nose? Yes Did you need to seek medical attention at a hospital or doctor's office? Unk When did it last happen? Unk If all above answers are "NO", may proceed with cephalosporin use.   . Sulfa Antibiotics Other (See  Comments)    Unknown reaction  . Dextromethorphan-Guaifenesin Other (See Comments)    Reported by [facility] - unknown reaction. Pt states she is not allergic to dextromethorphan-guaifenesin.  . Shellfish Allergy Hives, Itching and Rash   Review of Systems  Unable to perform ROS: Mental status change    Physical Exam Constitutional:      Comments: Lethargic, opens eyes to voice, shakes head yes/no appropriately then quickly goes back to sleep  Pulmonary:     Effort: No respiratory distress.  Skin:    General: Skin is warm and dry.     Vital Signs: BP 112/64 (BP Location: Right Wrist)   Pulse (!) 105   Temp 98.5 F (36.9 C) (Oral)   Resp (!) 22   Wt 84 kg   SpO2 96%   BMI 34.99 kg/m  Pain Scale: 0-10   Pain Score: 8    SpO2: SpO2: 96 % O2 Device:SpO2: 96 % O2 Flow Rate: .O2 Flow Rate (L/min): 5 L/min  IO: Intake/output summary:   Intake/Output Summary (Last 24 hours) at 08/03/2019 1258 Last data filed at 08/03/2019 0519 Gross per 24 hour  Intake 0 ml  Output --  Net 0 ml    LBM:   Baseline Weight: Weight: 84 kg Most recent weight: Weight: 84 kg     Palliative Assessment/Data: PPS 20%    Time Total: 50 minutes Greater than 50%  of this time was spent counseling and coordinating care related to the above assessment and plan.  Juel Burrow, DNP, AGNP-C Palliative Medicine Team [phone] Pager: [phone]

## 2019-08-03 NOTE — Consult Note (Addendum)
[facility] Renal Consultation Note    Indication for Consultation:  Management of ESRD/hemodialysis; anemia, hypertension/volume and secondary hyperparathyroidism  HPI: Joan Mosley is a 74 y.o. female with ESRD on HD TTS, DM, COPD on chronic O2, OSA,  prior CVA, dementia. Presented to the ED from home, family had concerns about "bedsores". She is essential bed bound. Febrile on admission with tachycardia, leukocytosis -code sepsis initiated. CXR showed bronchitic changes with tiny right pleural effusion. Labs notable for  K 3.6, WBC 17.8, Hgb 9.4, procalcitonin >150. Multiple draining wounds noted. Empiric antibiotics started. Blood cultures collected.   [name]. Last dialysis 7/6. She usually cuts dialysis treatments short. Does not tolerate much UF. Due for routine dialysis today.   Seen and examined at bedside. Opens eyes to voice, briefly responds then falls asleep during questions. Unable to give history.    Past Medical History:  Diagnosis Date  . Arthritis   . COPD (chronic obstructive pulmonary disease) ([place])   . Diabetic neuropathy ([place])   . ESRD on dialysis [facility])    ESRD  . 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 Sleep apnea    wears BPAP at home at night  . Thyroid disease   . TIA (transient ischemic attack)   . Type 2 diabetes mellitus with peripheral neuropathy [facility])    Past Surgical History:  Procedure Laterality Date  . ABDOMINAL HYSTERECTOMY    . ENDOSCOPIC RETROGRADE CHOLANGIOPANCREATOGRAPHY (ERCP) WITH PROPOFOL N/A 06/02/2019   Procedure: ENDOSCOPIC RETROGRADE CHOLANGIOPANCREATOGRAPHY (ERCP) WITH PROPOFOL;  Surgeon: [name], MD;  Location: [place];  Service: Gastroenterology;  Laterality: N/A;  . IR FLUORO GUIDE CV LINE RIGHT  11/14/2018  . RECTOCELE REPAIR    . REMOVAL OF STONES  06/02/2019   Procedure: REMOVAL OF STONES;  Surgeon: [name], MD;  Location: [facility] ENDOSCOPY;  Service:  Gastroenterology;;  . REPLACEMENT TOTAL KNEE BILATERAL Bilateral 2008  . SPHINCTEROTOMY  06/02/2019   Procedure: SPHINCTEROTOMY;  Surgeon: [name], MD;  Location: [facility] ENDOSCOPY;  Service: Gastroenterology;;  . THYROIDECTOMY     80%  . VAGINAL WOUND CLOSURE / REPAIR     Family History  Problem Relation Age of Onset  . Heart disease Mother   . Hypertension Mother   . Heart disease Father   . Hypertension Father    Social History:  reports that she quit smoking about 38 years ago. She has a 35.00 pack-year smoking history. She has never used smokeless tobacco. She reports that she does not drink alcohol and does not use drugs. Allergies  Allergen Reactions  . Avelox [Moxifloxacin Hcl In Nacl] Other (See Comments)    Unknown reaction  . Codeine Anaphylaxis  . Penicillins Rash    Did it involve swelling of the face/tongue/throat, SOB, or low BP? Unk Did it involve sudden or severe rash/hives, skin peeling, or any reaction on the inside of your mouth or nose? Yes Did you need to seek medical attention at a hospital or doctor's office? Unk When did it last happen? Unk If all above answers are "NO", may proceed with cephalosporin use.   . Sulfa Antibiotics Other (See Comments)    Unknown reaction  . Dextromethorphan-Guaifenesin Other (See Comments)    Reported by [facility] - unknown reaction. Pt states she is not allergic to dextromethorphan-guaifenesin.  . Shellfish Allergy Hives, Itching and Rash   Prior to Admission medications   Medication Sig Start Date End Date Taking?  Authorizing Provider  acetaminophen (TYLENOL) 325 MG tablet Take 650 mg by mouth every 6 (six) hours as needed (for arthritic pain).    Yes [provider]  atorvastatin (LIPITOR) 20 MG tablet Take 1 tablet (20 mg total) by mouth daily. 07/14/19  Yes Nche, Charlene Brooke, NP  clonazePAM (KLONOPIN) 0.5 MG tablet Take 0.25 mg on Tues, Thurs & Sat only and 0.5 mg in the evening Patient taking  differently: Take 0.25-0.5 mg by mouth See admin instructions. Take 0.25 mg by mouth on Tues, Thurs & Sat before dialysis only and 0.5 mg at bedtime every night 07/14/19  Yes Nche, Charlene Brooke, NP  collagenase (SANTYL) ointment Apply topically daily. Patient taking differently: Apply 1 application topically See admin instructions. Apply daily as directed to affected areas of right hip, left thigh, and groin area 07/01/19  Yes Vann, Jessica U, DO  docusate sodium (COLACE) 100 MG capsule Take 100 mg by mouth every morning.   Yes [provider]  FLUoxetine (PROZAC) 10 MG tablet Take 1 tablet (10 mg total) by mouth daily. 07/14/19  Yes Nche, Charlene Brooke, NP  furosemide (LASIX) 20 MG tablet Take 40 mg by mouth See admin instructions. Take 40 mg by mouth in the morning on non-dialysis days: Sun/Mon/Wed/Fri   Yes [provider]  ipratropium-albuterol (DUONEB) 0.5-2.5 (3) MG/3ML SOLN Take 3 mLs by nebulization 4 (four) times daily.   Yes [provider]  levalbuterol Penne Lash HFA) 45 MCG/ACT inhaler Inhale 2 puffs into the lungs every 4 (four) hours as needed for wheezing. 05/16/19  Yes Medina-Vargas, Monina C, NP  levothyroxine (EUTHYROX) 112 MCG tablet Take 1 tablet (112 mcg total) by mouth daily before breakfast. 07/14/19  Yes Nche, Charlene Brooke, NP  Lubricants (K-Y LUBRICANT JELLY SENSITIVE EX) Place 1 application into both nostrils as needed (as directed- for lubrication).    Yes [provider]  midodrine (PROAMATINE) 10 MG tablet Take 1 tablet (10 mg total) by mouth 3 (three) times daily. Patient taking differently: Take 10 mg by mouth 3 (three) times a week. Take 10 mg by mouth three times a week, on Tues/Thurs/Sat- 45 minutes prior to HD treatment 06/07/19  Yes Thurnell Lose, MD  montelukast (SINGULAIR) 10 MG tablet Take 1 tablet (10 mg total) by mouth at bedtime. 07/14/19  Yes Nche, Charlene Brooke, NP  nystatin (MYCOSTATIN/NYSTOP) powder Apply 1 application  topically 2 (two) times daily as needed (as directed- to any rashes). Patient taking differently: Apply 1 application topically 2 (two) times daily as needed (as directed, under skin folds and/or under the breasts).  05/16/19  Yes Medina-Vargas, Monina C, NP  nystatin cream (MYCOSTATIN) Apply 1 application topically 2 (two) times daily as needed (as directed, under skin folds and/or under the breasts).   Yes [provider]  OXYGEN Inhale 5-6 L/min into the lungs continuous.    Yes [provider]  oxymetazoline (AFRIN) 0.05 % nasal spray Place 1 spray into both nostrils 2 (two) times daily as needed for congestion.   Yes [provider]  pantoprazole (PROTONIX) 40 MG tablet Take 1 tablet (40 mg total) by mouth daily before breakfast. 07/14/19  Yes Nche, Charlene Brooke, NP  PRESCRIPTION MEDICATION See admin instructions. "BiPAP IPAP 18, EPAP 5, flow rate 5": At bedtime and during the day as needed for shortness of breath when napping   Yes [provider]  sevelamer carbonate (RENVELA) 800 MG tablet Take 1 tablet (800 mg total) by mouth 3 (three) times  daily with meals. Patient taking differently: Take 1,600 mg by mouth 3 (three) times daily with meals. And 1 tablet with a snack 05/16/19  Yes Medina-Vargas, Monina C, NP  SIMPLY SALINE NA Place 2 sprays into both nostrils as needed (for congestion).    Yes [provider]  trolamine salicylate (ASPER-FLEX) 10 % cream Apply 1 application topically as needed for muscle pain (to painful sites).   Yes [provider]  Darbepoetin Alfa (ARANESP) 25 MCG/0.42ML SOSY injection Inject 0.42 mLs (25 mcg total) into the vein every Tuesday with hemodialysis. 11/22/18   Hosie Poisson, MD  doxercalciferol (HECTOROL) 4 MCG/2ML injection Inject 1.5 mLs (3 mcg total) into the vein Every Tuesday,Thursday,and Saturday with dialysis. 04/25/19   Hosie Poisson, MD  Methoxy PEG-Epoetin Beta (MIRCERA IJ) Mircera 07/13/19 07/11/20   [provider]  heparin 5000 UNIT/ML injection Inject 1.5 mLs (7,500 Units total) into the skin every 8 (eight) hours. 02/22/19 04/20/19  Hosie Poisson, MD   Current Facility-Administered Medications  Medication Dose Route Frequency Provider Last Rate Last Admin  . acetaminophen (TYLENOL) tablet 650 mg  650 mg Oral Q6H PRN Karmen Bongo, MD       Or  . acetaminophen (TYLENOL) suppository 650 mg  650 mg Rectal Q6H PRN Karmen Bongo, MD      . atorvastatin (LIPITOR) tablet 20 mg  20 mg Oral Daily Karmen Bongo, MD      . calcium carbonate (dosed in mg elemental calcium) suspension 500 mg of elemental calcium  500 mg of elemental calcium Oral Q6H PRN Karmen Bongo, MD      . camphor-menthol [facility]) lotion 1 application  1 application Topical K3K PRN Karmen Bongo, MD       And  . hydrOXYzine (ATARAX/VISTARIL) tablet 25 mg  25 mg Oral Q8H PRN Karmen Bongo, MD      . cefTRIAXone (ROCEPHIN) 2 g in sodium chloride 0.9 % 100 mL IVPB  2 g Intravenous Q24H Karmen Bongo, MD      . Chlorhexidine Gluconate Cloth 2 % PADS 6 each  6 each Topical Daily Karmen Bongo, MD      . clonazePAM Bobbye [place]) disintegrating tablet 0.25 mg  0.25 mg Oral Q T,Th,Sa-HD Karmen Bongo, MD      . clonazePAM Bobbye [place]) tablet 0.5 mg  0.5 mg Oral Ivery Quale, MD      . collagenase (SANTYL) ointment   Topical Daily Marylyn Ishihara, Tyrone A, DO      . [START ON 08/28/2019] Darbepoetin Alfa (ARANESP) injection 25 mcg  25 mcg Intravenous Q Laveda Abbe, MD      . docusate sodium  [facility]) enema 283 mg  1 enema Rectal PRN Karmen Bongo, MD      . doxercalciferol (HECTOROL) injection 3 mcg  3 mcg Intravenous Q T,Th,Sa-HD Karmen Bongo, MD      . feeding supplement (NEPRO CARB STEADY) liquid 237 mL  237 mL Oral TID PRN Karmen Bongo, MD      . FLUoxetine (PROZAC) capsule 10 mg  10 mg Oral Daily Karmen Bongo, MD      . heparin injection 5,000 Units  5,000 Units Subcutaneous Camelia Phenes Karmen Bongo, MD   5,000 Units at 08/03/19 [phone]  . ipratropium-albuterol (DUONEB) 0.5-2.5 (3) MG/3ML nebulizer solution 3 mL  3 mL Nebulization Q4H PRN Marylyn Ishihara, Tyrone A, DO      . levalbuterol (XOPENEX HFA) inhaler 2 puff  2 puff Inhalation Q4H PRN Karmen Bongo, MD      . levothyroxine (  SYNTHROID) tablet 112 mcg  112 mcg Oral QAC breakfast Karmen Bongo, MD   112 mcg at 08/03/19 0433  . midodrine (PROAMATINE) tablet 10 mg  10 mg Oral Once per day on Tue Thu Sat Karmen Bongo, MD      . montelukast (SINGULAIR) tablet 10 mg  10 mg Oral Ivery Quale, MD   10 mg at 08/13/2019 2119  . ondansetron (ZOFRAN) tablet 4 mg  4 mg Oral Q6H PRN Karmen Bongo, MD       Or  . ondansetron [facility]) injection 4 mg  4 mg Intravenous Q6H PRN Karmen Bongo, MD      . pantoprazole (PROTONIX) EC tablet 40 mg  40 mg Oral QAC breakfast Karmen Bongo, MD      . sevelamer carbonate (RENVELA) tablet 1,600 mg  1,600 mg Oral TID WC Karmen Bongo, MD   1,600 mg at 08/06/2019 2118  . sevelamer carbonate (RENVELA) tablet 800 mg  800 mg Oral BID BM & HS PRN Karmen Bongo, MD      . sodium chloride flush (NS) 0.9 % injection 3 mL  3 mL Intravenous Q12H Karmen Bongo, MD   3 mL at 07/29/2019 2349  . sorbitol 70 % solution 30 mL  30 mL Oral PRN Karmen Bongo, MD      . vancomycin (VANCOCIN) IVPB 1000 mg/200 mL premix  1,000 mg Intravenous Q T,Th,Sa-HD Karmen Bongo, MD      . zolpidem Lorrin Mais) tablet 5 mg  5 mg Oral QHS PRN Karmen Bongo, MD         ROS: As per HPI otherwise negative.  Physical Exam: Vitals:   08/03/19 0351 08/03/19 0600 08/03/19 0739 08/03/19 0847  BP: (!) 105/56 (!) 118/96 112/64   Pulse: (!) 103 (!) 102 (!) 105   Resp: (!) 23 (!) 22 (!) 22   Temp: 99.6 F (37.6 C) 98.5 F (36.9 C) 98.5 F (36.9 C)   TempSrc: Rectal Oral Oral   SpO2: 94% 94% 95% 96%  Weight:         General: Chronically ill appearing, lying in bed, nad  Head: NCAT sclera not icteric MMM Neck: Supple. No JVD  appreciated.  Lungs: Normal WOB. Lungs clear bilaterally. Heart: RRR  Abdomen: soft, obese non-tender  Lower extremities: trace LE edema  Neuro:  Lethargic, follows some commands.    Dialysis Access: Retta Diones [facility]   Labs: Basic Metabolic Panel: Recent Labs  Lab 07/27/2019 1207 08/09/2019 2247 08/03/19 0943  NA 136 134* 135  K 3.0* 3.4* 3.6  CL 94*  --  95*  CO2 25  --  24  GLUCOSE 113*  --  88  BUN 18  --  23  CREATININE 2.85*  --  3.36*  CALCIUM 9.4  --  9.1   Liver Function Tests: Recent Labs  Lab 08/16/2019 1207  AST 27  ALT 17  ALKPHOS 212*  BILITOT 1.3*  PROT 5.4*  ALBUMIN 1.9*   No results for input(s): LIPASE, AMYLASE in the last 168 hours. No results for input(s): AMMONIA in the last 168 hours. CBC: Recent Labs  Lab 08/25/2019 1207 08/24/2019 2247 08/03/19 0943  WBC 17.5*  --  17.8*  NEUTROABS 14.5*  --   --   HGB 11.2* 10.2* 9.4*  HCT 36.9 30.0* 30.9*  MCV 95.8  --  96.3  PLT 235  --  263   Cardiac Enzymes: No results for input(s): CKTOTAL, CKMB, CKMBINDEX, TROPONINI in the last 168 hours. CBG: No  results for input(s): GLUCAP in the last 168 hours. Iron Studies: No results for input(s): IRON, TIBC, TRANSFERRIN, FERRITIN in the last 72 hours. Studies/Results: DG Chest Port 1 View  Result Date: 08/14/2019 CLINICAL DATA:  Sepsis, altered mental status, history COPD, diabetes mellitus EXAM: PORTABLE CHEST 1 VIEW COMPARISON:  Portable exam 1145 hours compared to 06/29/2019 FINDINGS: RIGHT jugular line tip projecting over RIGHT atrium. Rotated to the RIGHT. Normal heart size, mediastinal contours, and pulmonary vascularity. Atherosclerotic calcification aorta. Bronchitic changes with bibasilar atelectasis and tiny RIGHT pleural effusion. Upper lungs clear. No pneumothorax or acute osseous findings. IMPRESSION: Bronchitic changes with bibasilar atelectasis and tiny RIGHT pleural effusion. Aortic Atherosclerosis (ICD10-I70.0). Electronically Signed   By: Lavonia [name] M.D.    On: 08/22/2019 12:14    Dialysis Orders:  [facility] 4h 400/800 EDW 80kg 2K/2Ca UFP 4 Hectorol 3 TIW Mircera 60 mcg IV q 2 weeks (last 7/1)   Assessment/Plan: 1. Sepsis. Met SIRS criteria on admission. Likely secondary to purulent wounds to groin/thighs. IV Vanc/Rocephin per primary.  Blood cultures pending.  2. ESRD -  HD TTS. HD today on schedule. 4K bath 3. Hypotension/volume  - Chronic hypotension on midodrine for BP support with HD. UF as tolerated.  4. Anemia  - Hgb 9.4. Recent ESA dose as outpatient  5. Metabolic bone disease -  Ca ok. Continue binders/Hectorol. Follow labs  6. COPD/Chronic resp failure - 5L [place] at baseline  7. Dementia. Appears at baseline today.  8. Multiple pressure wounds to groin/thighs.  WOC consult 9. Nutrition - PCM. Liberlize diet as tolerated. Prot supp for low albumin   Lynnda Child PA-C [facility] 08/03/2019, 12:15 PM    Nephrology attending: Patient was seen and examined.  Chart reviewed.  I agree with assessment plan as outlined above. 74 year old female ESRD on HD admitted with blood sugars, sepsis.  On empiric antibiotics, culture pending. ESRD TTS, plan for dialysis today.  She has chronic hypotension therefore needs midodrine.  Continue binders.  She is a DNR. Recommend palliative care consult.  Katheran James, MD [facility].

## 2019-08-03 NOTE — Progress Notes (Signed)
Received patient to room 4e09 from ED, patient altert with complaints of pain due to multiple wounds. Tele monitor applied and CCMD notified. CHG bath given. Oriented to room and call bell within reach.

## 2019-08-03 NOTE — Progress Notes (Signed)
[facility]  PROGRESS NOTE    Joan Mosley  HUD:[id number] DOB: [birth year] DOA: 07/31/2019 PCP: Flossie Buffy, NP   Brief Narrative:   Joan Mosley a 74 y.o.femalewith medical history significant ofTIA; COPD on home5LO2; ESRD on TTS HD; dementia; morbid obesity (BMI 47); and DM presenting with AMS.  Her son reports that she came in for "all those bedsores".  She was here recently, trying to get home wound care but they aren't available until August.  Her wounds are continuing to get worse.  No change in her mental status, she is "extremely forgetful" at baseline.  No fever.  She does TTS HD  7/8: Difficult to get PIV. IJ ordered. Start rocephin/vanc when access established.    Assessment & Plan:   Principal Problem:   Sepsis due to cellulitis [facility]) Active Problems:   Hypothyroidism   ESRD on dialysis ([place])   Chronic respiratory failure with hypoxia, on home O2 therapy ([place])   Advanced care planning/counseling discussion   Type II diabetes mellitus with renal manifestations (HCC)   COPD (chronic obstructive pulmonary disease) (HCC)   Hypotension   Anemia in chronic kidney disease   Neurocognitive deficits   DNR (do not resuscitate)  Sepsis due to wound infection     - SIRS criteria in this patient includes: Leukocytosis, tachycardia, tachypnea      - Patient has evidence of acute organ failure with elevated lactate >2 that is not easily explained by another condition.     - Suspected source is wounds/purulent cellulitis     - Bld Cx NTD, UCX pending     - IJ established; start rocephin/vanc     - lactic acid is improved; follow     - afebrile, WBC still high  Chronic respiratory failure associated with COPD     - Patient is on 5L home O2 and uses BIPAP qhs; continue O2 support here     - Continue Duoneb QID and Xopenex prn     - Continue Singulair  ESRD on HD     - Patient on chronic TTS HD     - per nephrology     - continue Renvela, Aranesp, Hectorol for  CKD-associated anemia  Hypotension     - likely sepsis related     - she does take midodrine on HD days; continue     - Hold Lasix     - PRN fluid boluses  HLD     - atorvastatin  Dementia     - Per admitting physician, son says this is at/near baseline     - Continue Klonopin, Prozac  Goals of care     - Patient has severe COPD and ESRD at baseline     - Now with dementia     - Progressive weakness, wheelchair-dependent, does not leave the bed     - PC consulted; HCPOA: DNR, continue current TX  DM2     - follow glucose  Hypothyroidism     - continue synthroid  Obesity     - Body mass index is 34.99 kg/m.     - She is immobile and this appears unlikely to improve  Multiple wounds all POA     - unstagable left inner/upper thigh wound     - unstagable right inner upper thigh wound     - unstagable right trohchanter wound     - WOC addressing, see her note  DVT prophylaxis: heparin Code Status: DNR Family Communication: None at  bedside   Status is: Inpatient  Remains inpatient appropriate because:Inpatient level of care appropriate due to severity of illness   Dispo: The patient is from: Home              Anticipated d/c is to: TBD              Anticipated d/c date is: > 3 days              Patient currently is not medically stable to d/c.  Consultants:   Palliative care  Procedures:   IJ placement  Antimicrobials:  . Vanc, rocephin   ROS:  Unable to obtain d/t mentation.  Subjective: Hypotensive ON. Difficulty with acquiring access.  Objective: Vitals:   08/03/19 0739 08/03/19 0847 08/03/19 1326 08/03/19 1701  BP: 112/64  100/69 114/73  Pulse: (!) 105  97 (!) 102  Resp: (!) 22  20 20   Temp: 98.5 F (36.9 C)  97.8 F (36.6 C) 97.6 F (36.4 C)  TempSrc: Oral  Oral Oral  SpO2: 95% 96% 94% 99%  Weight:        Intake/Output Summary (Last 24 hours) at 08/03/2019 1949 Last data filed at 08/03/2019 1844 Gross per 24 hour  Intake 480 ml    Output 0 ml  Net 480 ml   Filed Weights   08/18/2019 1120  Weight: 84 kg    Examination:  General: 74 y.o. ill appearing female resting in bed Cardiovascular: tachy, +S1, S2, no m/g/r Respiratory: CTABL, tachypnea, on 5L [name] (baseline use) GI: BS+, NDNT, no masses noted, no organomegaly noted MSK: No e/c/c; multiple wounds inner thighs and right hip. Neuro: somnolent   Data Reviewed: I have personally reviewed following labs and imaging studies.  CBC: Recent Labs  Lab 08/05/2019 1207 07/27/2019 2247 08/03/19 0943  WBC 17.5*  --  17.8*  NEUTROABS 14.5*  --   --   HGB 11.2* 10.2* 9.4*  HCT 36.9 30.0* 30.9*  MCV 95.8  --  96.3  PLT 235  --  761   Basic Metabolic Panel: Recent Labs  Lab 08/10/2019 1207 08/13/2019 2247 08/03/19 0943  NA 136 134* 135  K 3.0* 3.4* 3.6  CL 94*  --  95*  CO2 25  --  24  GLUCOSE 113*  --  88  BUN 18  --  23  CREATININE 2.85*  --  3.36*  CALCIUM 9.4  --  9.1   GFR: Estimated Creatinine Clearance: 14 mL/min (A) (by C-G formula based on SCr of 3.36 mg/dL (H)). Liver Function Tests: Recent Labs  Lab 08/21/2019 1207  AST 27  ALT 17  ALKPHOS 212*  BILITOT 1.3*  PROT 5.4*  ALBUMIN 1.9*   No results for input(s): LIPASE, AMYLASE in the last 168 hours. No results for input(s): AMMONIA in the last 168 hours. Coagulation Profile: Recent Labs  Lab 08/25/2019 1207  INR 1.0   Cardiac Enzymes: No results for input(s): CKTOTAL, CKMB, CKMBINDEX, TROPONINI in the last 168 hours. BNP (last 3 results) Recent Labs    04/10/19 1418  PROBNP 81.0   HbA1C: No results for input(s): HGBA1C in the last 72 hours. CBG: No results for input(s): GLUCAP in the last 168 hours. Lipid Profile: No results for input(s): CHOL, HDL, LDLCALC, TRIG, CHOLHDL, LDLDIRECT in the last 72 hours. Thyroid Function Tests: No results for input(s): TSH, T4TOTAL, FREET4, T3FREE, THYROIDAB in the last 72 hours. Anemia Panel: No results for input(s): VITAMINB12, FOLATE,  FERRITIN, TIBC, IRON, RETICCTPCT in  the last 72 hours. Sepsis Labs: Recent Labs  Lab 08/23/2019 1207 08/12/2019 1557 08/06/2019 2308 08/03/19 0943  PROCALCITON  --   --  >150.00 >150.00  LATICACIDVEN 2.2* 2.5* 3.8* 1.2    Recent Results (from the past 240 hour(s))  Blood Culture (routine x 2)     Status: None (Preliminary result)   Collection Time: 08/22/2019 11:41 AM   Specimen: BLOOD LEFT HAND  Result Value Ref Range Status   Specimen Description BLOOD LEFT HAND  Final   Special Requests   Final    BOTTLES DRAWN AEROBIC AND ANAEROBIC Blood Culture results may not be optimal due to an inadequate volume of blood received in culture bottles   Culture   Final    NO GROWTH < 24 HOURS Performed at [facility], [place] [address], [place], [place] [address]    Report Status PENDING  Incomplete  Blood Culture (routine x 2)     Status: None (Preliminary result)   Collection Time: 08/16/2019  1:02 PM   Specimen: BLOOD LEFT HAND  Result Value Ref Range Status   Specimen Description BLOOD LEFT HAND  Final   Special Requests   Final    BOTTLES DRAWN AEROBIC AND ANAEROBIC Blood Culture adequate volume   Culture   Final    NO GROWTH < 24 HOURS Performed at [facility], [place] [address] [place], [place] [address]    Report Status PENDING  Incomplete  SARS Coronavirus 2 by RT PCR (hospital order, performed in [facility] hospital lab) Nasopharyngeal Nasopharyngeal Swab     Status: None   Collection Time: 08/19/2019  4:53 PM   Specimen: Nasopharyngeal Swab  Result Value Ref Range Status   SARS Coronavirus 2 NEGATIVE NEGATIVE Final    Comment: (NOTE) SARS-CoV-2 target nucleic acids are NOT DETECTED.  The SARS-CoV-2 RNA is generally detectable in upper and lower respiratory specimens during the acute phase of infection. The lowest concentration of SARS-CoV-2 viral copies this assay can detect is 250 copies / mL. A negative result does not preclude SARS-CoV-2 infection and should not be  used as the sole basis for treatment or other patient management decisions.  A negative result may occur with improper specimen collection / handling, submission of specimen other than nasopharyngeal swab, presence of viral mutation(s) within the areas targeted by this assay, and inadequate number of viral copies (<250 copies / mL). A negative result must be combined with clinical observations, patient history, and epidemiological information.  Fact Sheet for Patients:   StrictlyIdeas.no  Fact Sheet for Healthcare Providers: BankingDealers.co.za  This test is not yet approved or  cleared by the [place] FDA and has been authorized for detection and/or diagnosis of SARS-CoV-2 by FDA under an Emergency Use Authorization (EUA).  This EUA will remain in effect (meaning this test can be used) for the duration of the COVID-19 declaration under Section 564(b)(1) of the Act, 21 U.S.C. section 360bbb-3(b)(1), unless the authorization is terminated or revoked sooner.  Performed at [facility], [place] [address] [place], [place] [address]       Radiology Studies: IR Fluoro Guide CV Line Right  Result Date: 08/03/2019 INDICATION: 74 year old with history of sepsis and poor venous access. Request for central line placement. EXAM: CENTRAL LINE PLACEMENT WITH FLUOROSCOPIC AND ULTRASOUND GUIDANCE COMPARISON:  None. MEDICATIONS: None ANESTHESIA/SEDATION: None FLUOROSCOPY TIME:  6 seconds, 1 mGy COMPLICATIONS: None immediate. PROCEDURE: Informed consent was obtained for central line placement. Patient has a right  chest dialysis catheter. Attention was directed to the left side of the chest. Ultrasound confirmed a patent left internal jugular vein. Ultrasound image was saved for documentation. Left side of the neck was prepped and draped in sterile fashion. Maximal barrier sterile technique was utilized including caps, mask, sterile gowns, sterile  gloves, sterile drape, hand hygiene and skin antiseptic. Skin was anesthetized with 1% lidocaine. Small incision was made. Using ultrasound guidance, 21 gauge needle was directed into the left internal jugular vein and wire was advanced centrally. Peel-away sheath was placed. A dual lumen power PICC line was cut to 25 cm. The catheter was advanced through the peel-away sheath. Catheter tip was placed at the SVC and right atrium junction. Both lumens aspirated and flushed well. Both lumens were flushed with saline. Catheter was sutured to skin. Dressing was placed. Fluoroscopic and ultrasound images were taken and saved for documentation. FINDINGS: Left jugular central line tip at the SVC and right atrium junction. IMPRESSION: Successful placement of a left jugular central venous catheter. Electronically Signed   By: Markus Daft M.D.   On: 08/03/2019 17:58   IR US Guide Vasc Access Right  Result Date: 08/03/2019 INDICATION: 74 year old with history of sepsis and poor venous access. Request for central line placement. EXAM: CENTRAL LINE PLACEMENT WITH FLUOROSCOPIC AND ULTRASOUND GUIDANCE COMPARISON:  None. MEDICATIONS: None ANESTHESIA/SEDATION: None FLUOROSCOPY TIME:  6 seconds, 1 mGy COMPLICATIONS: None immediate. PROCEDURE: Informed consent was obtained for central line placement. Patient has a right chest dialysis catheter. Attention was directed to the left side of the chest. Ultrasound confirmed a patent left internal jugular vein. Ultrasound image was saved for documentation. Left side of the neck was prepped and draped in sterile fashion. Maximal barrier sterile technique was utilized including caps, mask, sterile gowns, sterile gloves, sterile drape, hand hygiene and skin antiseptic. Skin was anesthetized with 1% lidocaine. Small incision was made. Using ultrasound guidance, 21 gauge needle was directed into the left internal jugular vein and wire was advanced centrally. Peel-away sheath was placed. A dual  lumen power PICC line was cut to 25 cm. The catheter was advanced through the peel-away sheath. Catheter tip was placed at the SVC and right atrium junction. Both lumens aspirated and flushed well. Both lumens were flushed with saline. Catheter was sutured to skin. Dressing was placed. Fluoroscopic and ultrasound images were taken and saved for documentation. FINDINGS: Left jugular central line tip at the SVC and right atrium junction. IMPRESSION: Successful placement of a left jugular central venous catheter. Electronically Signed   By: Markus Daft M.D.   On: 08/03/2019 17:58   DG Chest Port 1 View  Result Date: 08/01/2019 CLINICAL DATA:  Sepsis, altered mental status, history COPD, diabetes mellitus EXAM: PORTABLE CHEST 1 VIEW COMPARISON:  Portable exam 1145 hours compared to 06/29/2019 FINDINGS: RIGHT jugular line tip projecting over RIGHT atrium. Rotated to the RIGHT. Normal heart size, mediastinal contours, and pulmonary vascularity. Atherosclerotic calcification aorta. Bronchitic changes with bibasilar atelectasis and tiny RIGHT pleural effusion. Upper lungs clear. No pneumothorax or acute osseous findings. IMPRESSION: Bronchitic changes with bibasilar atelectasis and tiny RIGHT pleural effusion. Aortic Atherosclerosis (ICD10-I70.0). Electronically Signed   By: Lavonia [name] M.D.   On: 08/09/2019 12:14   DG HIP UNILAT WITH PELVIS 2-3 VIEWS LEFT  Result Date: 08/03/2019 CLINICAL DATA:  Left hip pain. EXAM: DG HIP (WITH OR WITHOUT PELVIS) 2-3V LEFT COMPARISON:  CT 06/01/2019. FINDINGS: Air-filled loops of small large bowel noted. Adynamic ileus cannot be excluded. Diffuse  osteopenia and degenerative change. No acute bony or joint abnormality. No evidence of fracture or dislocation. Peripheral vascular calcification. IMPRESSION: 1. Diffuse osteopenia degenerative change. No acute bony abnormality. 2.  Mild adynamic ileus cannot be excluded. 3.  Peripheral vascular disease. Electronically Signed   By: Marcello Moores   Register   On: 08/03/2019 08:05     Scheduled Meds: . atorvastatin  20 mg Oral Daily  . Chlorhexidine Gluconate Cloth  6 each Topical Daily  . clonazePAM  0.25 mg Oral Q T,Th,Sa-HD  . clonazePAM  0.5 mg Oral QHS  . collagenase   Topical Daily  . [START ON Sep 03, 2019] Darbepoetin Alfa  25 mcg Intravenous Q Tue-HD  . doxercalciferol  3 mcg Intravenous Q T,Th,Sa-HD  . FLUoxetine  10 mg Oral Daily  . heparin  5,000 Units Subcutaneous Q8H  . levothyroxine  112 mcg Oral QAC breakfast  . lidocaine      . midodrine  10 mg Oral Once per day on Tue Thu Sat  . montelukast  10 mg Oral QHS  . pantoprazole  40 mg Oral QAC breakfast  . sevelamer carbonate  1,600 mg Oral TID WC  . sodium chloride flush  3 mL Intravenous Q12H   Continuous Infusions: . cefTRIAXone (ROCEPHIN)  IV 2 g (08/03/19 1336)  . vancomycin       LOS: 1 day    Time spent: 35 minutes spent in the coordination of care today.    Jonnie Finner, DO [facility]  If 7PM-7AM, please contact night-coverage [email or web address] 08/03/2019, 7:49 PM

## 2019-08-03 NOTE — Consult Note (Signed)
WOC Nurse Consult Note: Patient receiving care in [place].  Assisted with turning by primary RN and Advertising copywriter. Reason for Consult: "foul smelling drainage from wounds" Wound type: Left trochanter has an unstageable wound that is greyish in color and measures 7 cm x 6.5 cm.  It is foul smelling.  The left inner/upper thigh has 2 linear purple DTPIs that are beginning to develop grey slough.  The one closest to the labia measures 9 cm x 1.2 cm and is nearly all slough.  The more distal linear wound measures 10 cm x 1.2 cm and has less slough than the inner wound.  There is a foul smell to the wounds.  The right inner upper thigh also has two linear wounds; both of these are unstageable.  The wound closest to the labia measures 5 cm x 2 cm and is 100% grey foul smelling slough.  Distal to this wound is another 100% unstageable wound that is grey and foul.  It measures 10 cm x 1.5 cm.  The patient states she sits a lot every day and has been wearing a diaper and underwear.  I suspect this is the origin on these linear wounds.  The right trochanter, upper thigh area has a large unstageable wound with a foul odor that measures 6 cm x 24 cm.  It is surrounded by DTPI areas.  And, finally, at the apex of the left buttock there is a purple DTPI that measures 2.5 cm x 3 cm.  This does not need a dressing at this time, but every shift monitoring for slough development.  Additionally, the patient has MASD-ITD beneath the breasts and in the abdominal pannus fold.  Phil Dopp has been ordered for this.   I have also added to the care plan an air mattress with linen instructions, and bilateral Prevalon boots. Santyl will be necessary for the sloughing wounds, and careful skin cleansing and protective ointment for the buttocks.  Pressure Injury POA: Yes  Monitor the wound area(s) for worsening of condition such as: Signs/symptoms of infection,  Increase in size,  Development of or worsening of  odor, Development of pain, or increased pain at the affected locations.  Notify the medical team if any of these develop.  Thank you for the consult.  Discussed plan of care with the patient and bedside nurse.  [place] nurse will not follow at this time.  Please re-consult the [place] team if needed.  Val Riles, RN, MSN, CWOCN, CNS-BC, pager [phone]

## 2019-08-03 NOTE — Progress Notes (Signed)
PIV consult: Pt assessed for PIV by 2 VAST RNs. No suitable vein found for cannulation. Midline contraindicated in renal patients.

## 2019-08-03 NOTE — Progress Notes (Signed)
Referring Physician(s): Cherylann Ratel  Supervising Physician: Markus Daft  Patient Status:  [facility] - In-pt  Chief Complaint:  Poor venous access  HPI:  Joan Mosley a 74 y.o.femalewho was brought to the ED with confusion, fever, code sepsis.    Temp 100.2 axillary.  Sepsis likely secondary to ulcers on hips and groins.  She also has renal failure on dialysis.  She has poor venous access. PICC is contraindicated in dialysis patients.  We are asked to place a central line.  Allergies: Avelox [moxifloxacin hcl in nacl], Codeine, Penicillins, Sulfa antibiotics, Dextromethorphan-guaifenesin, and Shellfish allergy  Medications: Prior to Admission medications   Medication Sig Start Date End Date Taking? Authorizing Provider  acetaminophen (TYLENOL) 325 MG tablet Take 650 mg by mouth every 6 (six) hours as needed (for arthritic pain).    Yes [provider]  atorvastatin (LIPITOR) 20 MG tablet Take 1 tablet (20 mg total) by mouth daily. 07/14/19  Yes Nche, Charlene Brooke, NP  clonazePAM (KLONOPIN) 0.5 MG tablet Take 0.25 mg on Tues, Thurs & Sat only and 0.5 mg in the evening Patient taking differently: Take 0.25-0.5 mg by mouth See admin instructions. Take 0.25 mg by mouth on Tues, Thurs & Sat before dialysis only and 0.5 mg at bedtime every night 07/14/19  Yes Nche, Charlene Brooke, NP  collagenase (SANTYL) ointment Apply topically daily. Patient taking differently: Apply 1 application topically See admin instructions. Apply daily as directed to affected areas of right hip, left thigh, and groin area 07/01/19  Yes Vann, Jessica U, DO  docusate sodium (COLACE) 100 MG capsule Take 100 mg by mouth every morning.   Yes [provider]  FLUoxetine (PROZAC) 10 MG tablet Take 1 tablet (10 mg total) by mouth daily. 07/14/19  Yes Nche, Charlene Brooke, NP  furosemide (LASIX) 20 MG tablet Take 40 mg by mouth See admin instructions. Take 40 mg by mouth in the morning on  non-dialysis days: Sun/Mon/Wed/Fri   Yes [provider]  ipratropium-albuterol (DUONEB) 0.5-2.5 (3) MG/3ML SOLN Take 3 mLs by nebulization 4 (four) times daily.   Yes [provider]  levalbuterol Penne Lash HFA) 45 MCG/ACT inhaler Inhale 2 puffs into the lungs every 4 (four) hours as needed for wheezing. 05/16/19  Yes Medina-Vargas, Monina C, NP  levothyroxine (EUTHYROX) 112 MCG tablet Take 1 tablet (112 mcg total) by mouth daily before breakfast. 07/14/19  Yes Nche, Charlene Brooke, NP  Lubricants (K-Y LUBRICANT JELLY SENSITIVE EX) Place 1 application into both nostrils as needed (as directed- for lubrication).    Yes [provider]  midodrine (PROAMATINE) 10 MG tablet Take 1 tablet (10 mg total) by mouth 3 (three) times daily. Patient taking differently: Take 10 mg by mouth 3 (three) times a week. Take 10 mg by mouth three times a week, on Tues/Thurs/Sat- 45 minutes prior to HD treatment 06/07/19  Yes Thurnell Lose, MD  montelukast (SINGULAIR) 10 MG tablet Take 1 tablet (10 mg total) by mouth at bedtime. 07/14/19  Yes Nche, Charlene Brooke, NP  nystatin (MYCOSTATIN/NYSTOP) powder Apply 1 application topically 2 (two) times daily as needed (as directed- to any rashes). Patient taking differently: Apply 1 application topically 2 (two) times daily as needed (as directed, under skin folds and/or under the breasts).  05/16/19  Yes Medina-Vargas, Monina C, NP  nystatin cream (MYCOSTATIN) Apply 1 application topically 2 (two) times daily as needed (as directed, under skin folds and/or under the breasts).   Yes [provider]  OXYGEN  Inhale 5-6 L/min into the lungs continuous.    Yes [provider]  oxymetazoline (AFRIN) 0.05 % nasal spray Place 1 spray into both nostrils 2 (two) times daily as needed for congestion.   Yes [provider]  pantoprazole (PROTONIX) 40 MG tablet Take 1 tablet (40 mg total) by mouth daily before breakfast. 07/14/19  Yes Nche,  Charlene Brooke, NP  PRESCRIPTION MEDICATION See admin instructions. "BiPAP IPAP 18, EPAP 5, flow rate 5": At bedtime and during the day as needed for shortness of breath when napping   Yes [provider]  sevelamer carbonate (RENVELA) 800 MG tablet Take 1 tablet (800 mg total) by mouth 3 (three) times daily with meals. Patient taking differently: Take 1,600 mg by mouth 3 (three) times daily with meals. And 1 tablet with a snack 05/16/19  Yes Medina-Vargas, Monina C, NP  SIMPLY SALINE NA Place 2 sprays into both nostrils as needed (for congestion).    Yes [provider]  trolamine salicylate (ASPER-FLEX) 10 % cream Apply 1 application topically as needed for muscle pain (to painful sites).   Yes [provider]  Darbepoetin Alfa (ARANESP) 25 MCG/0.42ML SOSY injection Inject 0.42 mLs (25 mcg total) into the vein every Tuesday with hemodialysis. 11/22/18   Hosie Poisson, MD  doxercalciferol (HECTOROL) 4 MCG/2ML injection Inject 1.5 mLs (3 mcg total) into the vein Every Tuesday,Thursday,and Saturday with dialysis. 04/25/19   Hosie Poisson, MD  Methoxy PEG-Epoetin Beta (MIRCERA IJ) Mircera 07/13/19 07/11/20  [provider]  heparin 5000 UNIT/ML injection Inject 1.5 mLs (7,500 Units total) into the skin every 8 (eight) hours. 02/22/19 04/20/19  Hosie Poisson, MD     Vital Signs: BP 112/64 (BP Location: Right Wrist)   Pulse (!) 105   Temp 98.5 F (36.9 C) (Oral)   Resp (!) 22   Wt 84 kg   SpO2 96%   BMI 34.99 kg/m   Physical Exam Vitals reviewed.  Constitutional:      Appearance: She is obese.     Comments: Awake, not oriented  Cardiovascular:     Rate and Rhythm: Normal rate.  Pulmonary:     Effort: Pulmonary effort is normal. No respiratory distress.  Neurological:     General: No focal deficit present.     Comments: Confused, does not answer questions appropriately.     Imaging: DG Chest Port 1 View  Result Date: 08/23/2019 CLINICAL DATA:  Sepsis,  altered mental status, history COPD, diabetes mellitus EXAM: PORTABLE CHEST 1 VIEW COMPARISON:  Portable exam 1145 hours compared to 06/29/2019 FINDINGS: RIGHT jugular line tip projecting over RIGHT atrium. Rotated to the RIGHT. Normal heart size, mediastinal contours, and pulmonary vascularity. Atherosclerotic calcification aorta. Bronchitic changes with bibasilar atelectasis and tiny RIGHT pleural effusion. Upper lungs clear. No pneumothorax or acute osseous findings. IMPRESSION: Bronchitic changes with bibasilar atelectasis and tiny RIGHT pleural effusion. Aortic Atherosclerosis (ICD10-I70.0). Electronically Signed   By: Lavonia [name] M.D.   On: 07/29/2019 12:14    Labs:  CBC: Recent Labs    06/30/19 0556 06/30/19 0556 07/26/19 0421 07/27/2019 1207 08/11/2019 2247 08/03/19 0943  WBC 9.1  --  14.7* 17.5*  --  17.8*  HGB 11.3*   < > 11.1* 11.2* 10.2* 9.4*  HCT 38.4   < > 37.9 36.9 30.0* 30.9*  PLT 184  --  207 235  --  263   < > = values in this interval not displayed.    COAGS: Recent Labs  09/08/18 1530 09/08/18 1530 04/15/19 2250 05/04/19 1621 06/27/19 1230 08/11/2019 1207  INR 1.1   < > 1.0 1.0 1.0 1.0  APTT 34  --   --  90* 41* 38*   < > = values in this interval not displayed.    BMP: Recent Labs    06/30/19 0556 06/30/19 0556 07/26/19 0421 08/24/2019 1207 08/11/2019 2247 08/03/19 0943  NA 138   < > 134* 136 134* 135  K 4.0   < > 3.7 3.0* 3.4* 3.6  CL 100  --  92* 94*  --  95*  CO2 25  --  27 25  --  24  GLUCOSE 97  --  94 113*  --  88  BUN 12  --  16 18  --  23  CALCIUM 9.8  --  9.2 9.4  --  9.1  CREATININE 2.10*  --  2.61* 2.85*  --  3.36*  GFRNONAA 22*  --  17* 15*  --  13*  GFRAA 26*  --  20* 18*  --  15*   < > = values in this interval not displayed.    LIVER FUNCTION TESTS: Recent Labs    06/03/19 0533 06/03/19 0533 06/04/19 0523 06/05/19 0931 06/06/19 1200 06/06/19 2357 06/27/19 1230 08/22/2019 1207  BILITOT 0.7  --  0.4  --   --   --  0.7 1.3*    AST 21  --  21  --   --   --  33 27  ALT 14  --  12  --   --   --  14 17  ALKPHOS 113  --  111  --   --   --  145* 212*  PROT 5.3*  --  5.1*  --   --   --  5.9* 5.4*  ALBUMIN 2.3*   < > 2.3*   < > 2.2* 2.2* 2.1* 1.9*   < > = values in this interval not displayed.    Assessment and Plan:  Sepsis secondary to hip ulcerations.  Poor venous access.  Will proceed with image guided placement of a central line today by Dr. Anselm Pancoast.  Risks and benefits discussed with the patient's son including, but not limited to bleeding, infection, vascular injury, pneumothorax which may require chest tube placement, air embolism or even death  All  questions were answered, agreeable to proceed. Consent signed and in chart.  Electronically Signed: Murrell Redden, PA-C 08/03/2019, 11:32 AM    I spent a total of 15 Minutes at the the patient's bedside AND on the patient's hospital floor or unit, greater than 50% of which was counseling/coordinating care for central line placement.

## 2019-08-04 LAB — COMPREHENSIVE METABOLIC PANEL
ALT: 15 U/L (ref 0–44)
AST: 20 U/L (ref 15–41)
Albumin: 1.5 g/dL — ABNORMAL LOW (ref 3.5–5.0)
Alkaline Phosphatase: 160 U/L — ABNORMAL HIGH (ref 38–126)
Anion gap: 9 (ref 5–15)
BUN: 7 mg/dL — ABNORMAL LOW (ref 8–23)
CO2: 29 mmol/L (ref 22–32)
Calcium: 8.5 mg/dL — ABNORMAL LOW (ref 8.9–10.3)
Chloride: 100 mmol/L (ref 98–111)
Creatinine, Ser: 1.69 mg/dL — ABNORMAL HIGH (ref 0.44–1.00)
GFR calc Af Amer: 34 mL/min — ABNORMAL LOW (ref >60)
GFR calc non Af Amer: 29 mL/min — ABNORMAL LOW (ref >60)
Glucose, Bld: 127 mg/dL — ABNORMAL HIGH (ref 70–99)
Potassium: 3.7 mmol/L (ref 3.5–5.1)
Sodium: 138 mmol/L (ref 135–145)
Total Bilirubin: 0.6 mg/dL (ref 0.3–1.2)
Total Protein: 4.6 g/dL — ABNORMAL LOW (ref 6.5–8.1)

## 2019-08-04 LAB — CBC WITH DIFFERENTIAL/PLATELET
Abs Immature Granulocytes: 0.25 10*3/uL — ABNORMAL HIGH (ref 0.00–0.07)
Basophils Absolute: 0 10*3/uL (ref 0.0–0.1)
Basophils Relative: 0 %
Eosinophils Absolute: 0.2 10*3/uL (ref 0.0–0.5)
Eosinophils Relative: 1 %
HCT: 29.7 % — ABNORMAL LOW (ref 36.0–46.0)
Hemoglobin: 8.9 g/dL — ABNORMAL LOW (ref 12.0–15.0)
Immature Granulocytes: 2 %
Lymphocytes Relative: 8 %
Lymphs Abs: 1.3 10*3/uL (ref 0.7–4.0)
MCH: 28.1 pg (ref 26.0–34.0)
MCHC: 30 g/dL (ref 30.0–36.0)
MCV: 93.7 fL (ref 80.0–100.0)
Monocytes Absolute: 1.2 10*3/uL — ABNORMAL HIGH (ref 0.1–1.0)
Monocytes Relative: 7 %
Neutro Abs: 13.9 10*3/uL — ABNORMAL HIGH (ref 1.7–7.7)
Neutrophils Relative %: 82 %
Platelets: 219 10*3/uL (ref 150–400)
RBC: 3.17 MIL/uL — ABNORMAL LOW (ref 3.87–5.11)
RDW: 21.4 % — ABNORMAL HIGH (ref 11.5–15.5)
WBC: 16.8 10*3/uL — ABNORMAL HIGH (ref 4.0–10.5)
nRBC: 0 % (ref 0.0–0.2)

## 2019-08-04 LAB — GLUCOSE, CAPILLARY
Glucose-Capillary: 87 mg/dL (ref 70–99)
Glucose-Capillary: 113 mg/dL — ABNORMAL HIGH (ref 70–99)
Glucose-Capillary: 108 mg/dL — ABNORMAL HIGH (ref 70–99)
Glucose-Capillary: 135 mg/dL — ABNORMAL HIGH (ref 70–99)
Glucose-Capillary: 143 mg/dL — ABNORMAL HIGH (ref 70–99)

## 2019-08-04 LAB — PROCALCITONIN: Procalcitonin: 97.16 ng/mL

## 2019-08-04 LAB — MAGNESIUM: Magnesium: 1.7 mg/dL (ref 1.7–2.4)

## 2019-08-04 MED ORDER — CHLORHEXIDINE GLUCONATE CLOTH 2 % EX PADS
6.0000 | MEDICATED_PAD | Freq: Every day | CUTANEOUS | Status: DC
  Administered 2019-08-06: 6 via TOPICAL

## 2019-08-04 MED ORDER — DOXERCALCIFEROL 4 MCG/2ML IV SOLN
INTRAVENOUS | Status: AC
  Administered 2019-08-04: 3 ug
  Filled 2019-08-04: qty 2

## 2019-08-04 MED ORDER — VANCOMYCIN HCL IN DEXTROSE 1-5 GM/200ML-% IV SOLN
INTRAVENOUS | Status: AC
  Filled 2019-08-04: qty 200

## 2019-08-04 NOTE — Progress Notes (Signed)
PROGRESS NOTE    Joan Mosley  ZRA:[id number] DOB: 19-Aug-1945 DOA: 08/17/2019 PCP: Flossie Buffy, NP   Brief Narrative:   Joan Mosley a 74 y.o.femalewith medical history significant ofTIA; COPD on home5LO2; ESRD on TTS HD;dementia;morbid obesity (BMI 47); and DM presenting with AMS.Her son reports that she came in for "all those bedsores". She was here recently, trying to get home wound care but they aren't available until August. Her wounds are continuing to get worse. No change in her mental status, she is "extremely forgetful" at baseline. No fever. She does TTS HD.  7/9: Mentation is improving. She is A&O x 3 today and interactive. Continuing current abx. Procal is down. WBC decreasing. She is afebrile. Slow progress. Appreciate consultants assistance.   Assessment & Plan:   Principal Problem:   Sepsis due to cellulitis [facility]) Active Problems:   Hypothyroidism   ESRD on dialysis ([place])   Chronic respiratory failure with hypoxia, on home O2 therapy ([place])   Advanced care planning/counseling discussion   Type II diabetes mellitus with renal manifestations (HCC)   COPD (chronic obstructive pulmonary disease) (HCC)   Hypotension   Anemia in chronic kidney disease   Neurocognitive deficits   DNR (do not resuscitate)   Sepsis due to wound infection Acute metabolic encephalopathy     - SIRS criteria in this patient includes: Leukocytosis, tachycardia, tachypnea      - Patient has evidence of acute organ failure with elevated lactate >2 that is not easily explained by another condition.     - Suspected source is wounds/purulent cellulitis     - Bld Cx NTD, UCX pending     - IJ established; start rocephin/vanc     - lactic acid is improved; follow     - 7/9: continue rocephin/vanc. WBC slowly improving. Procal is down. Her mentation is improving.   Chronic respiratory failure associated with COPD     - Patient is on 5L home O2 and uses BIPAP qhs; continue  O2 support here     - Continue Duoneb QID and Xopenex prn     - Continue Singulair     - 7/9: stable on 5L [place]; BiPap ordered for qHS.  ESRD on HD     - Patient on chronic TTS HD     - per nephrology     - continue Renvela, Aranesp, Hectorol for CKD-associated anemia     - 7/9: next HD 7/10. Appreciate assistance.  Hypotension     - likely sepsis related     - she does take midodrine on HD days; continue     - Hold Lasix     - PRN fluid boluses     - 7/9: BP is soft, but stable; continue PRN fluid boluses  HLD     - atorvastatin  Dementia     - Per admitting physician, son says this is at/near baseline     - Continue Klonopin, Prozac  Goals of care     - Patient has severe COPD and ESRD at baseline     - Now with dementia     - Progressive weakness, wheelchair-dependent, does not leave the bed     - PC consulted; HCPOA: DNR, continue current TX  DM2     - 7/9: glucose acceptable, encourage diet, follow  Hypothyroidism     - continue synthroid  Obesity     - Body mass index is 34.99 kg/m.     - She is immobile  and this appears unlikely to improve  Multiple wounds all POA     - unstagable left inner/upper thigh wound     - unstagable right inner upper thigh wound     - unstagable right trohchanter wound     - WOC addressing, see her note     - 7/9: continue abx  DVT prophylaxis: heparin Code Status: DNR Family Communication: Spoke with son, Joan Mosley, by phone and updated with plan.   Status is: Inpatient  Remains inpatient appropriate because:Inpatient level of care appropriate due to severity of illness   Dispo: The patient is from: Home              Anticipated d/c is to: Home              Anticipated d/c date is: > 3 days              Patient currently is not medically stable to d/c.  Consultants:   Nephrology  Palliative Care  Procedures:   IJ placement  Antimicrobials:  . Rocephin, vancomicyn   ROS:  Reports hip pain. Denies  CP, N, V, ab pain. Remainder ROS is negative for all not previously mentioned.  Subjective: "Ok.Ok."  Objective: Vitals:   08/04/19 0805 08/04/19 1000 08/04/19 1100 08/04/19 1201  BP: 114/79 106/75 112/70 112/69  Pulse: 98 97 99 97  Resp: (!) 22   (!) 23  Temp: 97.9 F (36.6 C)   97.8 F (36.6 C)  TempSrc: Oral   Oral  SpO2: 100%   100%  Weight:        Intake/Output Summary (Last 24 hours) at 08/04/2019 1514 Last data filed at 08/04/2019 1435 Gross per 24 hour  Intake 1020 ml  Output 1500 ml  Net -480 ml   Filed Weights   08/12/2019 1120 08/03/19 2320 08/04/19 0320  Weight: 84 kg 84 kg 82.5 kg    Examination:  General: 74 y.o. ill-appearing female resting in bed Cardiovascular: RRR, +S1, S2, no m/g/r Respiratory: CTABL, no w/r/r, normal WOB on 5L [place] (baseline) GI: BS+, NDNT, soft MSK: No e/c/c, multiple wounds of inner thighs and right hip Neuro: A&O x 3, follows commands Psyc: calm/cooperative   Data Reviewed: I have personally reviewed following labs and imaging studies.  CBC: Recent Labs  Lab 08/05/2019 1207 08/24/2019 2247 08/03/19 0943 08/04/19 0500  WBC 17.5*  --  17.8* 16.8*  NEUTROABS 14.5*  --   --  13.9*  HGB 11.2* 10.2* 9.4* 8.9*  HCT 36.9 30.0* 30.9* 29.7*  MCV 95.8  --  96.3 93.7  PLT 235  --  263 976   Basic Metabolic Panel: Recent Labs  Lab 08/01/2019 1207 07/27/2019 2247 08/03/19 0943 08/04/19 0500  NA 136 134* 135 138  K 3.0* 3.4* 3.6 3.7  CL 94*  --  95* 100  CO2 25  --  24 29  GLUCOSE 113*  --  88 127*  BUN 18  --  23 7*  CREATININE 2.85*  --  3.36* 1.69*  CALCIUM 9.4  --  9.1 8.5*  MG  --   --   --  1.7   GFR: Estimated Creatinine Clearance: 27.6 mL/min (A) (by C-G formula based on SCr of 1.69 mg/dL (H)). Liver Function Tests: Recent Labs  Lab 08/15/2019 1207 08/04/19 0500  AST 27 20  ALT 17 15  ALKPHOS 212* 160*  BILITOT 1.3* 0.6  PROT 5.4* 4.6*  ALBUMIN 1.9* 1.5*   No results for  input(s): LIPASE, AMYLASE in the last 168  hours. No results for input(s): AMMONIA in the last 168 hours. Coagulation Profile: Recent Labs  Lab 08/18/2019 1207  INR 1.0   Cardiac Enzymes: No results for input(s): CKTOTAL, CKMB, CKMBINDEX, TROPONINI in the last 168 hours. BNP (last 3 results) Recent Labs    04/10/19 1418  PROBNP 81.0   HbA1C: No results for input(s): HGBA1C in the last 72 hours. CBG: Recent Labs  Lab 08/03/19 2006 08/04/19 0424 08/04/19 0801 08/04/19 1158  GLUCAP 88 87 113* 108*   Lipid Profile: No results for input(s): CHOL, HDL, LDLCALC, TRIG, CHOLHDL, LDLDIRECT in the last 72 hours. Thyroid Function Tests: No results for input(s): TSH, T4TOTAL, FREET4, T3FREE, THYROIDAB in the last 72 hours. Anemia Panel: No results for input(s): VITAMINB12, FOLATE, FERRITIN, TIBC, IRON, RETICCTPCT in the last 72 hours. Sepsis Labs: Recent Labs  Lab 08/25/2019 1207 07/29/2019 1557 08/12/2019 2308 08/03/19 0943 08/04/19 0500  PROCALCITON  --   --  >150.00 >150.00 97.16  LATICACIDVEN 2.2* 2.5* 3.8* 1.2  --     Recent Results (from the past 240 hour(s))  Blood Culture (routine x 2)     Status: None (Preliminary result)   Collection Time: 07/30/2019 11:41 AM   Specimen: BLOOD LEFT HAND  Result Value Ref Range Status   Specimen Description BLOOD LEFT HAND  Final   Special Requests   Final    BOTTLES DRAWN AEROBIC AND ANAEROBIC Blood Culture results may not be optimal due to an inadequate volume of blood received in culture bottles   Culture   Final    NO GROWTH 2 DAYS Performed at [facility], [place] [address], [place], [place] [address]    Report Status PENDING  Incomplete  Blood Culture (routine x 2)     Status: None (Preliminary result)   Collection Time: 08/01/2019  1:02 PM   Specimen: BLOOD LEFT HAND  Result Value Ref Range Status   Specimen Description BLOOD LEFT HAND  Final   Special Requests   Final    BOTTLES DRAWN AEROBIC AND ANAEROBIC Blood Culture adequate volume   Culture   Final    NO  GROWTH 2 DAYS Performed at [facility], [place] [address], [place], [place] [address]    Report Status PENDING  Incomplete  SARS Coronavirus 2 by RT PCR (hospital order, performed in [facility] hospital lab) Nasopharyngeal Nasopharyngeal Swab     Status: None   Collection Time: 08/09/2019  4:53 PM   Specimen: Nasopharyngeal Swab  Result Value Ref Range Status   SARS Coronavirus 2 NEGATIVE NEGATIVE Final    Comment: (NOTE) SARS-CoV-2 target nucleic acids are NOT DETECTED.  The SARS-CoV-2 RNA is generally detectable in upper and lower respiratory specimens during the acute phase of infection. The lowest concentration of SARS-CoV-2 viral copies this assay can detect is 250 copies / mL. A negative result does not preclude SARS-CoV-2 infection and should not be used as the sole basis for treatment or other patient management decisions.  A negative result may occur with improper specimen collection / handling, submission of specimen other than nasopharyngeal swab, presence of viral mutation(s) within the areas targeted by this assay, and inadequate number of viral copies (<250 copies / mL). A negative result must be combined with clinical observations, patient history, and epidemiological information.  Fact Sheet for Patients:   StrictlyIdeas.no  Fact Sheet for Healthcare Providers: BankingDealers.co.za  This test is not yet approved or  cleared by  the [employer] and has been authorized for detection and/or diagnosis of SARS-CoV-2 by FDA under an Emergency Use Authorization (EUA).  This EUA will remain in effect (meaning this test can be used) for the duration of the COVID-19 declaration under Section 564(b)(1) of the Act, 21 U.S.C. section 360bbb-3(b)(1), unless the authorization is terminated or revoked sooner.  Performed at [facility], [place] [address], [place], [place] [address]       Radiology Studies: IR Fluoro  Guide CV Line Right  Result Date: 08/03/2019 INDICATION: 74 year old with history of sepsis and poor venous access. Request for central line placement. EXAM: CENTRAL LINE PLACEMENT WITH FLUOROSCOPIC AND ULTRASOUND GUIDANCE COMPARISON:  None. MEDICATIONS: None ANESTHESIA/SEDATION: None FLUOROSCOPY TIME:  6 seconds, 1 mGy COMPLICATIONS: None immediate. PROCEDURE: Informed consent was obtained for central line placement. Patient has a right chest dialysis catheter. Attention was directed to the left side of the chest. Ultrasound confirmed a patent left internal jugular vein. Ultrasound image was saved for documentation. Left side of the neck was prepped and draped in sterile fashion. Maximal barrier sterile technique was utilized including caps, mask, sterile gowns, sterile gloves, sterile drape, hand hygiene and skin antiseptic. Skin was anesthetized with 1% lidocaine. Small incision was made. Using ultrasound guidance, 21 gauge needle was directed into the left internal jugular vein and wire was advanced centrally. Peel-away sheath was placed. A dual lumen power PICC line was cut to 25 cm. The catheter was advanced through the peel-away sheath. Catheter tip was placed at the SVC and right atrium junction. Both lumens aspirated and flushed well. Both lumens were flushed with saline. Catheter was sutured to skin. Dressing was placed. Fluoroscopic and ultrasound images were taken and saved for documentation. FINDINGS: Left jugular central line tip at the SVC and right atrium junction. IMPRESSION: Successful placement of a left jugular central venous catheter. Electronically Signed   By: Markus Daft M.D.   On: 08/03/2019 17:58   IR US Guide Vasc Access Right  Result Date: 08/03/2019 INDICATION: 74 year old with history of sepsis and poor venous access. Request for central line placement. EXAM: CENTRAL LINE PLACEMENT WITH FLUOROSCOPIC AND ULTRASOUND GUIDANCE COMPARISON:  None. MEDICATIONS: None ANESTHESIA/SEDATION:  None FLUOROSCOPY TIME:  6 seconds, 1 mGy COMPLICATIONS: None immediate. PROCEDURE: Informed consent was obtained for central line placement. Patient has a right chest dialysis catheter. Attention was directed to the left side of the chest. Ultrasound confirmed a patent left internal jugular vein. Ultrasound image was saved for documentation. Left side of the neck was prepped and draped in sterile fashion. Maximal barrier sterile technique was utilized including caps, mask, sterile gowns, sterile gloves, sterile drape, hand hygiene and skin antiseptic. Skin was anesthetized with 1% lidocaine. Small incision was made. Using ultrasound guidance, 21 gauge needle was directed into the left internal jugular vein and wire was advanced centrally. Peel-away sheath was placed. A dual lumen power PICC line was cut to 25 cm. The catheter was advanced through the peel-away sheath. Catheter tip was placed at the SVC and right atrium junction. Both lumens aspirated and flushed well. Both lumens were flushed with saline. Catheter was sutured to skin. Dressing was placed. Fluoroscopic and ultrasound images were taken and saved for documentation. FINDINGS: Left jugular central line tip at the SVC and right atrium junction. IMPRESSION: Successful placement of a left jugular central venous catheter. Electronically Signed   By: Markus Daft M.D.   On: 08/03/2019 17:58   DG HIP UNILAT WITH PELVIS 2-3  VIEWS LEFT  Result Date: 08/03/2019 CLINICAL DATA:  Left hip pain. EXAM: DG HIP (WITH OR WITHOUT PELVIS) 2-3V LEFT COMPARISON:  CT 06/01/2019. FINDINGS: Air-filled loops of small large bowel noted. Adynamic ileus cannot be excluded. Diffuse osteopenia and degenerative change. No acute bony or joint abnormality. No evidence of fracture or dislocation. Peripheral vascular calcification. IMPRESSION: 1. Diffuse osteopenia degenerative change. No acute bony abnormality. 2.  Mild adynamic ileus cannot be excluded. 3.  Peripheral vascular disease.  Electronically Signed   By: Marcello Moores  Register   On: 08/03/2019 08:05     Scheduled Meds: . atorvastatin  20 mg Oral Daily  . [START ON 08/05/2019] Chlorhexidine Gluconate Cloth  6 each Topical Q0600  . clonazePAM  0.25 mg Oral Q T,Th,Sa-HD  . clonazePAM  0.5 mg Oral QHS  . collagenase   Topical Daily  . [START ON August 14, 2019] Darbepoetin Alfa  25 mcg Intravenous Q Tue-HD  . doxercalciferol  3 mcg Intravenous Q T,Th,Sa-HD  . FLUoxetine  10 mg Oral Daily  . heparin  5,000 Units Subcutaneous Q8H  . levothyroxine  112 mcg Oral QAC breakfast  . midodrine  10 mg Oral Once per day on Tue Thu Sat  . montelukast  10 mg Oral QHS  . pantoprazole  40 mg Oral QAC breakfast  . sevelamer carbonate  1,600 mg Oral TID WC  . sodium chloride flush  3 mL Intravenous Q12H   Continuous Infusions: . cefTRIAXone (ROCEPHIN)  IV 2 g (08/04/19 1211)  . vancomycin 1,000 mg (08/04/19 0243)     LOS: 2 days    Time spent: 35 minutes spent in the coordination of care today.    Jonnie Finner, DO [facility]  If 7PM-7AM, please contact night-coverage [email or web address] 08/04/2019, 3:14 PM

## 2019-08-04 NOTE — Progress Notes (Signed)
[facility] Progress Note   Subjective:   Patient seen and examined today at bedside.  States she is feeling ok.  Denies SOB, CP, n/v/d, change in weakness.  Says dialysis has been going ok.  Admits to pain in lower extremities.  Oriented to self.  Objective Vitals:   08/04/19 0805 08/04/19 1000 08/04/19 1100 08/04/19 1201  BP: 114/79 106/75 112/70 112/69  Pulse: 98 97 99 97  Resp: (!) 22   (!) 23  Temp: 97.9 F (36.6 C)   97.8 F (36.6 C)  TempSrc: Oral   Oral  SpO2: 100%   100%  Weight:       Physical Exam General:chronically ill appearing female in NAD Heart:RRR Lungs:CTAB anterolaterally  Abdomen:soft, NTND Extremities:1+ LE edema thru hips, +tenderness to touch Dialysis Access: R IJ [facility]   Filed Weights   08/25/2019 1120 08/03/19 2320 08/04/19 0320  Weight: 84 kg 84 kg 82.5 kg    Intake/Output Summary (Last 24 hours) at 08/04/2019 1212 Last data filed at 08/04/2019 1200 Gross per 24 hour  Intake 900 ml  Output 1500 ml  Net -600 ml    Additional Objective Labs: Basic Metabolic Panel: Recent Labs  Lab 08/10/2019 1207 07/28/2019 1207 07/30/2019 2247 08/03/19 0943 08/04/19 0500  NA 136   < > 134* 135 138  K 3.0*   < > 3.4* 3.6 3.7  CL 94*  --   --  95* 100  CO2 25  --   --  24 29  GLUCOSE 113*  --   --  88 127*  BUN 18  --   --  23 7*  CREATININE 2.85*  --   --  3.36* 1.69*  CALCIUM 9.4  --   --  9.1 8.5*   < > = values in this interval not displayed.   Liver Function Tests: Recent Labs  Lab 07/29/2019 1207 08/04/19 0500  AST 27 20  ALT 17 15  ALKPHOS 212* 160*  BILITOT 1.3* 0.6  PROT 5.4* 4.6*  ALBUMIN 1.9* 1.5*   CBC: Recent Labs  Lab 08/22/2019 1207 08/07/2019 1207 08/07/2019 2247 08/03/19 0943 08/04/19 0500  WBC 17.5*  --   --  17.8* 16.8*  NEUTROABS 14.5*  --   --   --  13.9*  HGB 11.2*   < > 10.2* 9.4* 8.9*  HCT 36.9   < > 30.0* 30.9* 29.7*  MCV 95.8  --   --  96.3 93.7  PLT 235  --   --  263 219   < > = values in this interval  not displayed.   Blood Culture    Component Value Date/Time   SDES BLOOD LEFT HAND 08/17/2019 1302   SPECREQUEST  08/04/2019 1302    BOTTLES DRAWN AEROBIC AND ANAEROBIC Blood Culture adequate volume   CULT  08/23/2019 1302    NO GROWTH < 24 HOURS Performed at [facility], [place] [address] [place], [place] [address]    REPTSTATUS PENDING 08/19/2019 1302   CBG: Recent Labs  Lab 08/03/19 2006 08/04/19 0424 08/04/19 0801 08/04/19 1158  GLUCAP 88 87 113* 108*   Studies/Results: IR Fluoro Guide CV Line Right  Result Date: 08/03/2019 INDICATION: 74 year old with history of sepsis and poor venous access. Request for central line placement. EXAM: CENTRAL LINE PLACEMENT WITH FLUOROSCOPIC AND ULTRASOUND GUIDANCE COMPARISON:  None. MEDICATIONS: None ANESTHESIA/SEDATION: None FLUOROSCOPY TIME:  6 seconds, 1 mGy COMPLICATIONS: None immediate. PROCEDURE: Informed consent was obtained for central line placement.  Patient has a right chest dialysis catheter. Attention was directed to the left side of the chest. Ultrasound confirmed a patent left internal jugular vein. Ultrasound image was saved for documentation. Left side of the neck was prepped and draped in sterile fashion. Maximal barrier sterile technique was utilized including caps, mask, sterile gowns, sterile gloves, sterile drape, hand hygiene and skin antiseptic. Skin was anesthetized with 1% lidocaine. Small incision was made. Using ultrasound guidance, 21 gauge needle was directed into the left internal jugular vein and wire was advanced centrally. Peel-away sheath was placed. A dual lumen power PICC line was cut to 25 cm. The catheter was advanced through the peel-away sheath. Catheter tip was placed at the SVC and right atrium junction. Both lumens aspirated and flushed well. Both lumens were flushed with saline. Catheter was sutured to skin. Dressing was placed. Fluoroscopic and ultrasound images were taken and saved for documentation.  FINDINGS: Left jugular central line tip at the SVC and right atrium junction. IMPRESSION: Successful placement of a left jugular central venous catheter. Electronically Signed   By: Markus Daft M.D.   On: 08/03/2019 17:58   IR US Guide Vasc Access Right  Result Date: 08/03/2019 INDICATION: 74 year old with history of sepsis and poor venous access. Request for central line placement. EXAM: CENTRAL LINE PLACEMENT WITH FLUOROSCOPIC AND ULTRASOUND GUIDANCE COMPARISON:  None. MEDICATIONS: None ANESTHESIA/SEDATION: None FLUOROSCOPY TIME:  6 seconds, 1 mGy COMPLICATIONS: None immediate. PROCEDURE: Informed consent was obtained for central line placement. Patient has a right chest dialysis catheter. Attention was directed to the left side of the chest. Ultrasound confirmed a patent left internal jugular vein. Ultrasound image was saved for documentation. Left side of the neck was prepped and draped in sterile fashion. Maximal barrier sterile technique was utilized including caps, mask, sterile gowns, sterile gloves, sterile drape, hand hygiene and skin antiseptic. Skin was anesthetized with 1% lidocaine. Small incision was made. Using ultrasound guidance, 21 gauge needle was directed into the left internal jugular vein and wire was advanced centrally. Peel-away sheath was placed. A dual lumen power PICC line was cut to 25 cm. The catheter was advanced through the peel-away sheath. Catheter tip was placed at the SVC and right atrium junction. Both lumens aspirated and flushed well. Both lumens were flushed with saline. Catheter was sutured to skin. Dressing was placed. Fluoroscopic and ultrasound images were taken and saved for documentation. FINDINGS: Left jugular central line tip at the SVC and right atrium junction. IMPRESSION: Successful placement of a left jugular central venous catheter. Electronically Signed   By: Markus Daft M.D.   On: 08/03/2019 17:58   DG HIP UNILAT WITH PELVIS 2-3 VIEWS LEFT  Result Date:  08/03/2019 CLINICAL DATA:  Left hip pain. EXAM: DG HIP (WITH OR WITHOUT PELVIS) 2-3V LEFT COMPARISON:  CT 06/01/2019. FINDINGS: Air-filled loops of small large bowel noted. Adynamic ileus cannot be excluded. Diffuse osteopenia and degenerative change. No acute bony or joint abnormality. No evidence of fracture or dislocation. Peripheral vascular calcification. IMPRESSION: 1. Diffuse osteopenia degenerative change. No acute bony abnormality. 2.  Mild adynamic ileus cannot be excluded. 3.  Peripheral vascular disease. Electronically Signed   By: Marcello Moores  Register   On: 08/03/2019 08:05    Medications: . cefTRIAXone (ROCEPHIN)  IV 2 g (08/04/19 1211)  . vancomycin 1,000 mg (08/04/19 0243)   . atorvastatin  20 mg Oral Daily  . Chlorhexidine Gluconate Cloth  6 each Topical Daily  . clonazePAM  0.25 mg Oral Q T,Th,Sa-HD  .  clonazePAM  0.5 mg Oral QHS  . collagenase   Topical Daily  . [START ON 08/16/2019] Darbepoetin Alfa  25 mcg Intravenous Q Tue-HD  . doxercalciferol  3 mcg Intravenous Q T,Th,Sa-HD  . FLUoxetine  10 mg Oral Daily  . heparin  5,000 Units Subcutaneous Q8H  . levothyroxine  112 mcg Oral QAC breakfast  . midodrine  10 mg Oral Once per day on Tue Thu Sat  . montelukast  10 mg Oral QHS  . pantoprazole  40 mg Oral QAC breakfast  . sevelamer carbonate  1,600 mg Oral TID WC  . sodium chloride flush  3 mL Intravenous Q12H    Dialysis Orders: [place] T[facility] 4h 400/800 EDW 80kg 2K/2Ca UFP 4 Hectorol 3 TIW Mircera 60 mcg IV q 2 weeks (last 7/1)   Assessment/Plan: 1. Sepsis 2/2 ?cellulitis- Met SIRS criteria on admission. Likely secondary to purulent wounds to groin/thighs. IV Vanc/Rocephin per primary. WBC 16.8. Blood cultures pending. UC sent. 2. ESRD -  HD TTS. HD overnight per regular schedule using 4K bath.  Next HD 7/10, orders written.  3. Hypotension/volume  - Chronic hypotension on midodrine for BP support with HD. Increased volume on exam.  CXR showed chronic lower lung changes with  tiny R pleural effusion, upper lungs clear. Titrate down volume as tolerated.  4. Anemia  - Hgb 9.4>8.9. Recent ESA dose as outpatient Follow trends.  5. Metabolic bone disease -  Ca ok. Continue binders/Hectorol. Check phos. 6. COPD/Chronic resp failure - 5L [place] at baseline.   7. Dementia - Appears at baseline today.  8. Multiple pressure wounds to groin/thighs- per [place] 9. Nutrition - PCM. Liberlize diet as tolerated. Prot supp for low albumin  10. DMT2 - per primary  Jen Mow, PA-C [facility] Pager: [phone] 08/04/2019,12:12 PM  LOS: 2 days

## 2019-08-04 NOTE — Progress Notes (Signed)
Dressing changes done per order. Pt tolerated fair.

## 2019-08-05 LAB — CBC WITH DIFFERENTIAL/PLATELET
Abs Immature Granulocytes: 0.15 10*3/uL — ABNORMAL HIGH (ref 0.00–0.07)
Basophils Absolute: 0 10*3/uL (ref 0.0–0.1)
Basophils Relative: 0 %
Eosinophils Absolute: 0.2 10*3/uL (ref 0.0–0.5)
Eosinophils Relative: 2 %
HCT: 28.5 % — ABNORMAL LOW (ref 36.0–46.0)
Hemoglobin: 8.4 g/dL — ABNORMAL LOW (ref 12.0–15.0)
Immature Granulocytes: 1 %
Lymphocytes Relative: 14 %
Lymphs Abs: 1.6 10*3/uL (ref 0.7–4.0)
MCH: 28.5 pg (ref 26.0–34.0)
MCHC: 29.5 g/dL — ABNORMAL LOW (ref 30.0–36.0)
MCV: 96.6 fL (ref 80.0–100.0)
Monocytes Absolute: 1 10*3/uL (ref 0.1–1.0)
Monocytes Relative: 8 %
Neutro Abs: 8.9 10*3/uL — ABNORMAL HIGH (ref 1.7–7.7)
Neutrophils Relative %: 75 %
Platelets: 203 10*3/uL (ref 150–400)
RBC: 2.95 MIL/uL — ABNORMAL LOW (ref 3.87–5.11)
RDW: 21.6 % — ABNORMAL HIGH (ref 11.5–15.5)
WBC: 11.8 10*3/uL — ABNORMAL HIGH (ref 4.0–10.5)
nRBC: 0 % (ref 0.0–0.2)

## 2019-08-05 LAB — RENAL FUNCTION PANEL
Albumin: 1.3 g/dL — ABNORMAL LOW (ref 3.5–5.0)
Anion gap: 11 (ref 5–15)
BUN: 11 mg/dL (ref 8–23)
CO2: 27 mmol/L (ref 22–32)
Calcium: 9.3 mg/dL (ref 8.9–10.3)
Chloride: 100 mmol/L (ref 98–111)
Creatinine, Ser: 2.23 mg/dL — ABNORMAL HIGH (ref 0.44–1.00)
GFR calc Af Amer: 24 mL/min — ABNORMAL LOW (ref >60)
GFR calc non Af Amer: 21 mL/min — ABNORMAL LOW (ref >60)
Glucose, Bld: 92 mg/dL (ref 70–99)
Phosphorus: 3.7 mg/dL (ref 2.5–4.6)
Potassium: 4 mmol/L (ref 3.5–5.1)
Sodium: 138 mmol/L (ref 135–145)

## 2019-08-05 LAB — GLUCOSE, CAPILLARY
Glucose-Capillary: 65 mg/dL — ABNORMAL LOW (ref 70–99)
Glucose-Capillary: 71 mg/dL (ref 70–99)
Glucose-Capillary: 73 mg/dL (ref 70–99)
Glucose-Capillary: 83 mg/dL (ref 70–99)
Glucose-Capillary: 90 mg/dL (ref 70–99)
Glucose-Capillary: 90 mg/dL (ref 70–99)

## 2019-08-05 LAB — MAGNESIUM: Magnesium: 1.8 mg/dL (ref 1.7–2.4)

## 2019-08-05 MED ORDER — HEPARIN SODIUM (PORCINE) 1000 UNIT/ML IJ SOLN
INTRAMUSCULAR | Status: AC
  Administered 2019-08-05: 3800 [IU]
  Filled 2019-08-05: qty 4

## 2019-08-05 MED ORDER — DOXERCALCIFEROL 4 MCG/2ML IV SOLN
INTRAVENOUS | Status: AC
  Filled 2019-08-05: qty 2

## 2019-08-05 MED ORDER — VANCOMYCIN HCL IN DEXTROSE 1-5 GM/200ML-% IV SOLN
INTRAVENOUS | Status: AC
  Administered 2019-08-05: 1000 mg via INTRAVENOUS
  Filled 2019-08-05: qty 200

## 2019-08-05 MED ORDER — DEXTROSE 50 % IV SOLN
INTRAVENOUS | Status: AC
  Administered 2019-08-05: 50 mL
  Filled 2019-08-05: qty 50

## 2019-08-05 NOTE — Progress Notes (Addendum)
PROGRESS NOTE    [facility]  CWC:[id number] DOB: Sep 20, 1945 DOA: 07/27/2019 PCP: Flossie Buffy, NP   Brief Narrative:   Joan Mosley a 74 y.o.femalewith medical history significant ofTIA; COPD on home5LO2; ESRD on TTS HD;dementia;morbid obesity (BMI 47); and DM presenting with AMS.Her son reports that she came in for "all those bedsores". She was here recently, trying to get home wound care but they aren't available until August. Her wounds are continuing to get worse. No change in her mental status, she is "extremely forgetful" at baseline. No fever. She does TTS HD.  7/10: Respiratory status at baseline. Mentation improving. WBC improving. Continue IV abx through tonight; reassess for conversion to cefdinir or clinda in AM. She needs HH services at discharge. Will speak with CM. Appreciate consultants assistance.   Assessment & Plan:   Principal Problem:   Sepsis due to cellulitis [facility]) Active Problems:   Hypothyroidism   ESRD on dialysis ([place])   Chronic respiratory failure with hypoxia, on home O2 therapy ([place])   Advanced care planning/counseling discussion   Type II diabetes mellitus with renal manifestations (HCC)   COPD (chronic obstructive pulmonary disease) (HCC)   Hypotension   Anemia in chronic kidney disease   Neurocognitive deficits   DNR (do not resuscitate)  Sepsis due to wound infection Acute metabolic encephalopathy - SIRS criteria in this patient includes: Leukocytosis, tachycardia, tachypnea  - Patient has evidence of acute organ failure with elevated lactate >2 that is not easily explained by another condition. - Suspected source is wounds/purulent cellulitis - Bld Cx NTD, UCX pending - IJ established; start rocephin/vanc - lactic acid is improved; follow - 7/10: continue rocephin/vanc. WBC improved. BP is coming up. Overall progressing. Expect her to be on IV abx through today; possible transition to  cefdinir or clinda in AM if she continue to progress.  Chronic respiratory failure associated with COPD - Patient is on 5L home O2 and uses BIPAP qhs; continue O2 support here - Continue Duoneb QID and Xopenex prn - Continue Singulair     - 7/10: stable on 5L [place]; BiPap ordered for qHS.  ESRD on HD - Patient on chronic TTS HD - per nephrology - continue Renvela, Aranesp, Hectorol for CKD-associated anemia     - 7/10: HD today. Appreciate nephro assistance.  Hypotension - likely sepsis related - she does take midodrine on HD days; continue - Hold Lasix - PRN fluid boluses     - 7/10: BP is coming up; she did get midodrine today; follow  HLD - atorvastatin  Dementia - Per admitting physician, son says this is at/near baseline - Continue Klonopin, Prozac  Goals of care - Patient has severe COPD and ESRD at baseline - Now with dementia - Progressive weakness, wheelchair-dependent, does not leave the bed - PC consulted; HCPOA: DNR, continue current TX  DM2 - 7/10: encourage diet; may need a little D5 today.  Hypothyroidism - continue synthroid  Obesity - Body mass index is 34.99 kg/m. - She is immobile and this appears unlikely to improve  Multiple wounds all POA - unstagable left inner/upper thigh wound - unstagable right inner upper thigh wound - unstagable left trohchanter wound - WOC addressing, see her note     - 7/10: continue abx; will need wound care at discharge  DVT prophylaxis: heparin Code Status: DNR Family Communication: Spoke with son by phone, updated with plan.   Status is: Inpatient  Remains inpatient appropriate because:Inpatient level of care appropriate due to  severity of illness   Dispo: The patient is from: Home              Anticipated d/c is to: Home              Anticipated d/c date is: 2 days              Patient currently is  not medically stable to d/c.  Consultants:   Nephrology  Palliative Care  Procedures:   IJ Placement  Antimicrobials:  . Rocephin, vancomycin   ROS:  Denies CP, N, V, ab pain . Remainder 10-pt ROS is negative for all not previously mentioned.  Subjective: "I can't go home?"  Objective: Vitals:   08/04/19 2100 08/04/19 2315 08/05/19 0337 08/05/19 0700  BP: 123/69 102/72 108/60 115/77  Pulse: 95 88 96 90  Resp: 20 20 20  (!) 24  Temp:  97.9 F (36.6 C) 97.7 F (36.5 C) 98.4 F (36.9 C)  TempSrc:  Oral Oral Oral  SpO2: 100% 99% 92% 99%  Weight:   110.7 kg     Intake/Output Summary (Last 24 hours) at 08/05/2019 0818 Last data filed at 08/04/2019 1824 Gross per 24 hour  Intake 580 ml  Output 0 ml  Net 580 ml   Filed Weights   08/03/19 2320 08/04/19 0320 08/05/19 0337  Weight: 84 kg 82.5 kg 110.7 kg    Examination:  General: 74 y.o. female resting in bed in NAD seen on HD Cardiovascular: RRR, +S1, S2, no m/g/r, equal pulses throughout Respiratory: CTABL, no w/r/r, normal WOB on 5L [place] GI: BS+, NDNT, no masses noted MSK: No e/c/c; multiple wounds of inner thighs and right hip Neuro: A&O x 3, follows commands Psyc: Appropriate interaction but flat affect, calm/cooperative   Data Reviewed: I have personally reviewed following labs and imaging studies.  CBC: Recent Labs  Lab 07/30/2019 1207 08/01/2019 2247 08/03/19 0943 08/04/19 0500 08/05/19 0427  WBC 17.5*  --  17.8* 16.8* 11.8*  NEUTROABS 14.5*  --   --  13.9* 8.9*  HGB 11.2* 10.2* 9.4* 8.9* 8.4*  HCT 36.9 30.0* 30.9* 29.7* 28.5*  MCV 95.8  --  96.3 93.7 96.6  PLT 235  --  263 219 937   Basic Metabolic Panel: Recent Labs  Lab 07/28/2019 1207 08/17/2019 2247 08/03/19 0943 08/04/19 0500 08/05/19 0427  NA 136 134* 135 138 138  K 3.0* 3.4* 3.6 3.7 4.0  CL 94*  --  95* 100 100  CO2 25  --  24 29 27   GLUCOSE 113*  --  88 127* 92  BUN 18  --  23 7* 11  CREATININE 2.85*  --  3.36* 1.69* 2.23*  CALCIUM  9.4  --  9.1 8.5* 9.3  MG  --   --   --  1.7 1.8  PHOS  --   --   --   --  3.7   GFR: Estimated Creatinine Clearance: 24.7 mL/min (A) (by C-G formula based on SCr of 2.23 mg/dL (H)). Liver Function Tests: Recent Labs  Lab 07/30/2019 1207 08/04/19 0500 08/05/19 0427  AST 27 20  --   ALT 17 15  --   ALKPHOS 212* 160*  --   BILITOT 1.3* 0.6  --   PROT 5.4* 4.6*  --   ALBUMIN 1.9* 1.5* 1.3*   No results for input(s): LIPASE, AMYLASE in the last 168 hours. No results for input(s): AMMONIA in the last 168 hours. Coagulation Profile: Recent Labs  Lab 08/22/2019  1207  INR 1.0   Cardiac Enzymes: No results for input(s): CKTOTAL, CKMB, CKMBINDEX, TROPONINI in the last 168 hours. BNP (last 3 results) Recent Labs    04/10/19 1418  PROBNP 81.0   HbA1C: No results for input(s): HGBA1C in the last 72 hours. CBG: Recent Labs  Lab 08/04/19 1158 08/04/19 1551 08/04/19 2014 08/05/19 0028 08/05/19 0427  GLUCAP 108* 135* 143* 90 71   Lipid Profile: No results for input(s): CHOL, HDL, LDLCALC, TRIG, CHOLHDL, LDLDIRECT in the last 72 hours. Thyroid Function Tests: No results for input(s): TSH, T4TOTAL, FREET4, T3FREE, THYROIDAB in the last 72 hours. Anemia Panel: No results for input(s): VITAMINB12, FOLATE, FERRITIN, TIBC, IRON, RETICCTPCT in the last 72 hours. Sepsis Labs: Recent Labs  Lab 08/06/2019 1207 08/03/2019 1557 07/30/2019 2308 08/03/19 0943 08/04/19 0500  PROCALCITON  --   --  >150.00 >150.00 97.16  LATICACIDVEN 2.2* 2.5* 3.8* 1.2  --     Recent Results (from the past 240 hour(s))  Blood Culture (routine x 2)     Status: None (Preliminary result)   Collection Time: 08/25/2019 11:41 AM   Specimen: BLOOD LEFT HAND  Result Value Ref Range Status   Specimen Description BLOOD LEFT HAND  Final   Special Requests   Final    BOTTLES DRAWN AEROBIC AND ANAEROBIC Blood Culture results may not be optimal due to an inadequate volume of blood received in culture bottles   Culture    Final    NO GROWTH 2 DAYS Performed at [facility], [place] [address] [place], [place] [address]    Report Status PENDING  Incomplete  Blood Culture (routine x 2)     Status: None (Preliminary result)   Collection Time: 08/13/2019  1:02 PM   Specimen: BLOOD LEFT HAND  Result Value Ref Range Status   Specimen Description BLOOD LEFT HAND  Final   Special Requests   Final    BOTTLES DRAWN AEROBIC AND ANAEROBIC Blood Culture adequate volume   Culture   Final    NO GROWTH 2 DAYS Performed at [facility], [place] [address] [place], [place] [id number]    Report Status PENDING  Incomplete  SARS Coronavirus 2 by RT PCR (hospital order, performed in [facility] hospital lab) Nasopharyngeal Nasopharyngeal Swab     Status: None   Collection Time: 07/29/2019  4:53 PM   Specimen: Nasopharyngeal Swab  Result Value Ref Range Status   SARS Coronavirus 2 NEGATIVE NEGATIVE Final    Comment: (NOTE) SARS-CoV-2 target nucleic acids are NOT DETECTED.  The SARS-CoV-2 RNA is generally detectable in upper and lower respiratory specimens during the acute phase of infection. The lowest concentration of SARS-CoV-2 viral copies this assay can detect is 250 copies / mL. A negative result does not preclude SARS-CoV-2 infection and should not be used as the sole basis for treatment or other patient management decisions.  A negative result may occur with improper specimen collection / handling, submission of specimen other than nasopharyngeal swab, presence of viral mutation(s) within the areas targeted by this assay, and inadequate number of viral copies (<250 copies / mL). A negative result must be combined with clinical observations, patient history, and epidemiological information.  Fact Sheet for Patients:   StrictlyIdeas.no  Fact Sheet for Healthcare Providers: BankingDealers.co.za  This test is not yet approved or  cleared by the [place] FDA  and has been authorized for detection and/or diagnosis of SARS-CoV-2 by FDA under an Emergency Use Authorization (EUA).  This EUA will remain in effect (meaning this test can be used) for the duration of the COVID-19 declaration under Section 564(b)(1) of the Act, 21 U.S.C. section 360bbb-3(b)(1), unless the authorization is terminated or revoked sooner.  Performed at [facility], [place] [address], [place], [place] [address]       Radiology Studies: IR Fluoro Guide CV Line Right  Result Date: 08/03/2019 INDICATION: 74 year old with history of sepsis and poor venous access. Request for central line placement. EXAM: CENTRAL LINE PLACEMENT WITH FLUOROSCOPIC AND ULTRASOUND GUIDANCE COMPARISON:  None. MEDICATIONS: None ANESTHESIA/SEDATION: None FLUOROSCOPY TIME:  6 seconds, 1 mGy COMPLICATIONS: None immediate. PROCEDURE: Informed consent was obtained for central line placement. Patient has a right chest dialysis catheter. Attention was directed to the left side of the chest. Ultrasound confirmed a patent left internal jugular vein. Ultrasound image was saved for documentation. Left side of the neck was prepped and draped in sterile fashion. Maximal barrier sterile technique was utilized including caps, mask, sterile gowns, sterile gloves, sterile drape, hand hygiene and skin antiseptic. Skin was anesthetized with 1% lidocaine. Small incision was made. Using ultrasound guidance, 21 gauge needle was directed into the left internal jugular vein and wire was advanced centrally. Peel-away sheath was placed. A dual lumen power PICC line was cut to 25 cm. The catheter was advanced through the peel-away sheath. Catheter tip was placed at the SVC and right atrium junction. Both lumens aspirated and flushed well. Both lumens were flushed with saline. Catheter was sutured to skin. Dressing was placed. Fluoroscopic and ultrasound images were taken and saved for documentation. FINDINGS: Left jugular central line  tip at the SVC and right atrium junction. IMPRESSION: Successful placement of a left jugular central venous catheter. Electronically Signed   By: Markus Daft M.D.   On: 08/03/2019 17:58   IR US Guide Vasc Access Right  Result Date: 08/03/2019 INDICATION: 74 year old with history of sepsis and poor venous access. Request for central line placement. EXAM: CENTRAL LINE PLACEMENT WITH FLUOROSCOPIC AND ULTRASOUND GUIDANCE COMPARISON:  None. MEDICATIONS: None ANESTHESIA/SEDATION: None FLUOROSCOPY TIME:  6 seconds, 1 mGy COMPLICATIONS: None immediate. PROCEDURE: Informed consent was obtained for central line placement. Patient has a right chest dialysis catheter. Attention was directed to the left side of the chest. Ultrasound confirmed a patent left internal jugular vein. Ultrasound image was saved for documentation. Left side of the neck was prepped and draped in sterile fashion. Maximal barrier sterile technique was utilized including caps, mask, sterile gowns, sterile gloves, sterile drape, hand hygiene and skin antiseptic. Skin was anesthetized with 1% lidocaine. Small incision was made. Using ultrasound guidance, 21 gauge needle was directed into the left internal jugular vein and wire was advanced centrally. Peel-away sheath was placed. A dual lumen power PICC line was cut to 25 cm. The catheter was advanced through the peel-away sheath. Catheter tip was placed at the SVC and right atrium junction. Both lumens aspirated and flushed well. Both lumens were flushed with saline. Catheter was sutured to skin. Dressing was placed. Fluoroscopic and ultrasound images were taken and saved for documentation. FINDINGS: Left jugular central line tip at the SVC and right atrium junction. IMPRESSION: Successful placement of a left jugular central venous catheter. Electronically Signed   By: Markus Daft M.D.   On: 08/03/2019 17:58     Scheduled Meds: . atorvastatin  20 mg Oral Daily  . Chlorhexidine Gluconate Cloth  6 each  Topical Q0600  . clonazePAM  0.25 mg Oral Q  T,Th,Sa-HD  . clonazePAM  0.5 mg Oral QHS  . collagenase   Topical Daily  . [START ON 08-28-2019] Darbepoetin Alfa  25 mcg Intravenous Q Tue-HD  . doxercalciferol  3 mcg Intravenous Q T,Th,Sa-HD  . FLUoxetine  10 mg Oral Daily  . heparin  5,000 Units Subcutaneous Q8H  . levothyroxine  112 mcg Oral QAC breakfast  . midodrine  10 mg Oral Once per day on Tue Thu Sat  . montelukast  10 mg Oral QHS  . pantoprazole  40 mg Oral QAC breakfast  . sevelamer carbonate  1,600 mg Oral TID WC  . sodium chloride flush  3 mL Intravenous Q12H   Continuous Infusions: . cefTRIAXone (ROCEPHIN)  IV 2 g (08/04/19 1211)  . vancomycin 1,000 mg (08/04/19 0243)     LOS: 3 days    Time spent: 35 minutes spent in the coordination of care today.    Jonnie Finner, DO [facility]  If 7PM-7AM, please contact night-coverage [email or web address] 08/05/2019, 8:18 AM

## 2019-08-05 NOTE — Progress Notes (Signed)
[facility] Progress Note   Subjective:   Patient seen and examined at bedside in dialysis.  No specific complaints.  Oriented to self only.  Year 2012, place - "I dont know".  Denies SOB, CP and n/v/d.    Objective Vitals:   08/05/19 0830 08/05/19 0900 08/05/19 0930 08/05/19 1000  BP: 101/65 98/80 131/71 (!) 87/59  Pulse: 94 94 99 96  Resp: (!) 22 (!) 23 (!) 27 (!) 24  Temp:      TempSrc:      SpO2: 99% 98% 99% 97%  Weight:       Physical Exam General:chronically ill appearing female in NAD Heart:RRR Lungs:CTAB anterolaterally  Abdomen:soft, NTND Extremities:trace LE edema, +tenderness to palpation  Dialysis Access: [facility] accessed   [facility] Weights   08/03/19 2320 08/04/19 0320 08/05/19 0337  Weight: 84 kg 82.5 kg 110.7 kg    Intake/Output Summary (Last 24 hours) at 08/05/2019 1014 Last data filed at 08/04/2019 1824 Gross per 24 hour  Intake 460 ml  Output 0 ml  Net 460 ml    Additional Objective Labs: Basic Metabolic Panel: Recent Labs  Lab 08/03/19 0943 08/04/19 0500 08/05/19 0427  NA 135 138 138  K 3.6 3.7 4.0  CL 95* 100 100  CO2 24 29 27   GLUCOSE 88 127* 92  BUN 23 7* 11  CREATININE 3.36* 1.69* 2.23*  CALCIUM 9.1 8.5* 9.3  PHOS  --   --  3.7   Liver Function Tests: Recent Labs  Lab 08/02/2019 1207 08/04/19 0500 08/05/19 0427  AST 27 20  --   ALT 17 15  --   ALKPHOS 212* 160*  --   BILITOT 1.3* 0.6  --   PROT 5.4* 4.6*  --   ALBUMIN 1.9* 1.5* 1.3*   CBC: Recent Labs  Lab 08/09/2019 1207 07/30/2019 2247 08/03/19 0943 08/04/19 0500 08/05/19 0427  WBC 17.5*  --  17.8* 16.8* 11.8*  NEUTROABS 14.5*  --   --  13.9* 8.9*  HGB 11.2*   < > 9.4* 8.9* 8.4*  HCT 36.9   < > 30.9* 29.7* 28.5*  MCV 95.8  --  96.3 93.7 96.6  PLT 235  --  263 219 203   < > = values in this interval not displayed.   Blood Culture    Component Value Date/Time   SDES BLOOD LEFT HAND 08/17/2019 1302   SPECREQUEST  08/13/2019 1302    BOTTLES DRAWN AEROBIC  AND ANAEROBIC Blood Culture adequate volume   CULT  08/18/2019 1302    NO GROWTH 2 DAYS Performed at [facility], [place] [address] [place], [place] [address]    REPTSTATUS PENDING 07/31/2019 1302   CBG: Recent Labs  Lab 08/04/19 1158 08/04/19 1551 08/04/19 2014 08/05/19 0028 08/05/19 0427  GLUCAP 108* 135* 143* 90 71    Lab Results  Component Value Date   INR 1.0 08/24/2019   INR 1.0 06/27/2019   INR 1.0 05/04/2019   Studies/Results: IR Fluoro Guide CV Line Right  Result Date: 08/03/2019 INDICATION: 74 year old with history of sepsis and poor venous access. Request for central line placement. EXAM: CENTRAL LINE PLACEMENT WITH FLUOROSCOPIC AND ULTRASOUND GUIDANCE COMPARISON:  None. MEDICATIONS: None ANESTHESIA/SEDATION: None FLUOROSCOPY TIME:  6 seconds, 1 mGy COMPLICATIONS: None immediate. PROCEDURE: Informed consent was obtained for central line placement. Patient has a right chest dialysis catheter. Attention was directed to the left side of the chest. Ultrasound confirmed a patent left internal jugular vein. Ultrasound image  was saved for documentation. Left side of the neck was prepped and draped in sterile fashion. Maximal barrier sterile technique was utilized including caps, mask, sterile gowns, sterile gloves, sterile drape, hand hygiene and skin antiseptic. Skin was anesthetized with 1% lidocaine. Small incision was made. Using ultrasound guidance, 21 gauge needle was directed into the left internal jugular vein and wire was advanced centrally. Peel-away sheath was placed. A dual lumen power PICC line was cut to 25 cm. The catheter was advanced through the peel-away sheath. Catheter tip was placed at the SVC and right atrium junction. Both lumens aspirated and flushed well. Both lumens were flushed with saline. Catheter was sutured to skin. Dressing was placed. Fluoroscopic and ultrasound images were taken and saved for documentation. FINDINGS: Left jugular central line tip  at the SVC and right atrium junction. IMPRESSION: Successful placement of a left jugular central venous catheter. Electronically Signed   By: Markus Daft M.D.   On: 08/03/2019 17:58   IR US Guide Vasc Access Right  Result Date: 08/03/2019 INDICATION: 74 year old with history of sepsis and poor venous access. Request for central line placement. EXAM: CENTRAL LINE PLACEMENT WITH FLUOROSCOPIC AND ULTRASOUND GUIDANCE COMPARISON:  None. MEDICATIONS: None ANESTHESIA/SEDATION: None FLUOROSCOPY TIME:  6 seconds, 1 mGy COMPLICATIONS: None immediate. PROCEDURE: Informed consent was obtained for central line placement. Patient has a right chest dialysis catheter. Attention was directed to the left side of the chest. Ultrasound confirmed a patent left internal jugular vein. Ultrasound image was saved for documentation. Left side of the neck was prepped and draped in sterile fashion. Maximal barrier sterile technique was utilized including caps, mask, sterile gowns, sterile gloves, sterile drape, hand hygiene and skin antiseptic. Skin was anesthetized with 1% lidocaine. Small incision was made. Using ultrasound guidance, 21 gauge needle was directed into the left internal jugular vein and wire was advanced centrally. Peel-away sheath was placed. A dual lumen power PICC line was cut to 25 cm. The catheter was advanced through the peel-away sheath. Catheter tip was placed at the SVC and right atrium junction. Both lumens aspirated and flushed well. Both lumens were flushed with saline. Catheter was sutured to skin. Dressing was placed. Fluoroscopic and ultrasound images were taken and saved for documentation. FINDINGS: Left jugular central line tip at the SVC and right atrium junction. IMPRESSION: Successful placement of a left jugular central venous catheter. Electronically Signed   By: Markus Daft M.D.   On: 08/03/2019 17:58    Medications: . cefTRIAXone (ROCEPHIN)  IV 2 g (08/04/19 1211)  . vancomycin 1,000 mg (08/05/19  0914)   . atorvastatin  20 mg Oral Daily  . Chlorhexidine Gluconate Cloth  6 each Topical Q0600  . clonazePAM  0.25 mg Oral Q T,Th,Sa-HD  . clonazePAM  0.5 mg Oral QHS  . collagenase   Topical Daily  . [START ON 08-11-2019] Darbepoetin Alfa  25 mcg Intravenous Q Tue-HD  . doxercalciferol      . doxercalciferol  3 mcg Intravenous Q T,Th,Sa-HD  . FLUoxetine  10 mg Oral Daily  . heparin  5,000 Units Subcutaneous Q8H  . heparin sodium (porcine)      . levothyroxine  112 mcg Oral QAC breakfast  . midodrine  10 mg Oral Once per day on Tue Thu Sat  . montelukast  10 mg Oral QHS  . pantoprazole  40 mg Oral QAC breakfast  . sevelamer carbonate  1,600 mg Oral TID WC  . sodium chloride flush  3 mL Intravenous Q12H  D[facility] 400/800 EDW 80kg 2K/2Ca UFP 4 Hectorol 3 TIW Mircera 60 mcg IV q 2 weeks (last 7/1)  Assessment/Plan: 1. Sepsis 2/2 ?cellulitis- Met SIRS criteria on admission. Likely secondary to purulent wounds to groin/thighs. WBC 16.8>11.8.Blood cultures NGTD. UC sent.  IV Vanc/Rocephin, may transition to PO ABX after today, per primary.  2. ESRD -HD TTS. HD this AM per regular schedule.  Tolerating well so far. K 4.0, using Raliegh Ip bath.  3. Hypotension/volume - Chronic hypotension on midodrine for BP support with HD.  CXR showed chronic lower lung changes with tiny R pleural effusion, upper lungs clear. Volume status improving with HD, UF goal 3L. Titrate down volume as tolerated. 4. Anemia - Hgb 9.4>8.9>8.4. Recent ESA dose as outpatient, increase dose on d/c.  Follow trends.  5. Metabolic bone disease -Ca and phos in goal. Continue binders/Hectorol.  6. COPD/Chronic resp failure - 5L  at baseline.   7. Dementia - Appears at baseline today.  8. Multiple pressure wounds to groin/thighs- per [place] 9. Nutrition -PCM. Liberlize diet as tolerated. Prot supp for low albumin  10. DMT2 - per primary  Jen Mow, PA-C [facility] Pager:  [phone] 08/05/2019,10:14 AM  LOS: 3 days

## 2019-08-05 NOTE — Progress Notes (Signed)
CBG at 1600 was 65, Pt refused to eat lunch and refused to drink any juice to bring up her blood sugar. 1/2 an amp of D5 given, CBG now 90.

## 2019-08-05 NOTE — Progress Notes (Signed)
Pharmacy Antibiotic Note  Joan Mosley is a 74 y.o. female admitted on 08/23/2019 with cellulitis.  Pharmacy has been consulted for vancomycin dosing. Pt is afebrile, WBC improved at 11.8, mentation improving. Pt with history of ESRD on HD.   Plan: Vancomycin 1gm IV qHD (T-Th-Sa) Ceftriaxone 2gm IV q24h F/u renal plans, C&S, clinical status and pre-HD vanc level PRN F/u continuation of gram neg coverage  Weight: 110.7 kg (244 lb)  Temp (24hrs), Avg:97.9 F (36.6 C), Min:97.7 F (36.5 C), Max:98.4 F (36.9 C)  Recent Labs  Lab 08/12/2019 1207 08/26/2019 1557 08/20/2019 2308 08/03/19 0943 08/04/19 0500 08/05/19 0427  WBC 17.5*  --   --  17.8* 16.8* 11.8*  CREATININE 2.85*  --   --  3.36* 1.69* 2.23*  LATICACIDVEN 2.2* 2.5* 3.8* 1.2  --   --     Estimated Creatinine Clearance: 24.7 mL/min (A) (by C-G formula based on SCr of 2.23 mg/dL (H)).    Allergies  Allergen Reactions  . Avelox [Moxifloxacin Hcl In Nacl] Other (See Comments)    Unknown reaction  . Codeine Anaphylaxis  . Penicillins Rash    Did it involve swelling of the face/tongue/throat, SOB, or low BP? Unk Did it involve sudden or severe rash/hives, skin peeling, or any reaction on the inside of your mouth or nose? Yes Did you need to seek medical attention at a hospital or doctor's office? Unk When did it last happen? Unk If all above answers are "NO", may proceed with cephalosporin use.   . Sulfa Antibiotics Other (See Comments)    Unknown reaction  . Dextromethorphan-Guaifenesin Other (See Comments)    Reported by [facility] - unknown reaction. Pt states she is not allergic to dextromethorphan-guaifenesin.  . Shellfish Allergy Hives, Itching and Rash    Antimicrobials this admission: Vanc 7/7>> CTX 7/7>> Flagyl x 1 7/7  Dose adjustments this admission: N/A  Microbiology results: 7/7 Bcx x2 - NG ing  Thank you for allowing pharmacy to be a part of this patient's care.  Claudina Lick, PharmD PGY1 Acute  Care Pharmacy Resident  08/05/2019 9:13 AM  Please check [email or web address] for unit-specific pharmacy phone numbers.

## 2019-08-06 LAB — COMPREHENSIVE METABOLIC PANEL
ALT: 17 U/L (ref 0–44)
AST: 17 U/L (ref 15–41)
Albumin: 1.4 g/dL — ABNORMAL LOW (ref 3.5–5.0)
Alkaline Phosphatase: 155 U/L — ABNORMAL HIGH (ref 38–126)
Anion gap: 9 (ref 5–15)
BUN: 6 mg/dL — ABNORMAL LOW (ref 8–23)
CO2: 28 mmol/L (ref 22–32)
Calcium: 9.7 mg/dL (ref 8.9–10.3)
Chloride: 100 mmol/L (ref 98–111)
Creatinine, Ser: 1.76 mg/dL — ABNORMAL HIGH (ref 0.44–1.00)
GFR calc Af Amer: 32 mL/min — ABNORMAL LOW (ref >60)
GFR calc non Af Amer: 28 mL/min — ABNORMAL LOW (ref >60)
Glucose, Bld: 89 mg/dL (ref 70–99)
Potassium: 4.1 mmol/L (ref 3.5–5.1)
Sodium: 137 mmol/L (ref 135–145)
Total Bilirubin: 0.9 mg/dL (ref 0.3–1.2)
Total Protein: 4.7 g/dL — ABNORMAL LOW (ref 6.5–8.1)

## 2019-08-06 LAB — CBC WITH DIFFERENTIAL/PLATELET
Abs Immature Granulocytes: 0.23 10*3/uL — ABNORMAL HIGH (ref 0.00–0.07)
Basophils Absolute: 0.1 10*3/uL (ref 0.0–0.1)
Basophils Relative: 0 %
Eosinophils Absolute: 0.2 10*3/uL (ref 0.0–0.5)
Eosinophils Relative: 2 %
HCT: 30.6 % — ABNORMAL LOW (ref 36.0–46.0)
Hemoglobin: 9.3 g/dL — ABNORMAL LOW (ref 12.0–15.0)
Immature Granulocytes: 2 %
Lymphocytes Relative: 12 %
Lymphs Abs: 1.5 10*3/uL (ref 0.7–4.0)
MCH: 29.5 pg (ref 26.0–34.0)
MCHC: 30.4 g/dL (ref 30.0–36.0)
MCV: 97.1 fL (ref 80.0–100.0)
Monocytes Absolute: 1 10*3/uL (ref 0.1–1.0)
Monocytes Relative: 7 %
Neutro Abs: 10 10*3/uL — ABNORMAL HIGH (ref 1.7–7.7)
Neutrophils Relative %: 77 %
Platelets: 203 10*3/uL (ref 150–400)
RBC: 3.15 MIL/uL — ABNORMAL LOW (ref 3.87–5.11)
RDW: 21.8 % — ABNORMAL HIGH (ref 11.5–15.5)
WBC: 13 10*3/uL — ABNORMAL HIGH (ref 4.0–10.5)
nRBC: 0 % (ref 0.0–0.2)

## 2019-08-06 LAB — GLUCOSE, CAPILLARY
Glucose-Capillary: 70 mg/dL (ref 70–99)
Glucose-Capillary: 76 mg/dL (ref 70–99)
Glucose-Capillary: 78 mg/dL (ref 70–99)
Glucose-Capillary: 80 mg/dL (ref 70–99)
Glucose-Capillary: 80 mg/dL (ref 70–99)
Glucose-Capillary: 82 mg/dL (ref 70–99)
Glucose-Capillary: 90 mg/dL (ref 70–99)

## 2019-08-06 LAB — MAGNESIUM: Magnesium: 1.7 mg/dL (ref 1.7–2.4)

## 2019-08-06 MED ORDER — MIRTAZAPINE 15 MG PO TBDP
15.0000 mg | ORAL_TABLET | Freq: Every day | ORAL | Status: DC
  Filled 2019-08-06: qty 1

## 2019-08-06 NOTE — Progress Notes (Signed)
Joan [facility] Progress Note   Subjective:   Patient seen and examined at bedside.  No new complaints.  Denies SOB, CP, n/v/d and abdominal pain.    Objective Vitals:   08/05/19 2330 08/06/19 0408 08/06/19 0814 08/06/19 1233  BP: 106/79 105/79 (!) 108/52 121/77  Pulse: 100 100 93 100  Resp: 20 20 19  (!) 22  Temp: 98 F (36.7 C) 97.7 F (36.5 C) 97.6 F (36.4 C) 97.6 F (36.4 C)  TempSrc: Oral Oral Oral Axillary  SpO2: 94% 95% 100%   Weight:       Physical Exam General:Chronically ill appearing female in NAD Heart:RRR Lungs:mostly CTAB anteriolaterally Abdomen:soft, NTND Extremities:trace edema Dialysis Access: [facility]   Filed Weights   08/03/19 2320 08/04/19 0320 08/05/19 0337  Weight: 84 kg 82.5 kg 110.7 kg    Intake/Output Summary (Last 24 hours) at 08/06/2019 1336 Last data filed at 08/06/2019 0952 Gross per 24 hour  Intake 50 ml  Output --  Net 50 ml    Additional Objective Labs: Basic Metabolic Panel: Recent Labs  Lab 08/04/19 0500 08/05/19 0427 08/06/19 0435  NA 138 138 137  K 3.7 4.0 4.1  CL 100 100 100  CO2 29 27 28   GLUCOSE 127* 92 89  BUN 7* 11 6*  CREATININE 1.69* 2.23* 1.76*  CALCIUM 8.5* 9.3 9.7  PHOS  --  3.7  --    Liver Function Tests: Recent Labs  Lab 08/22/2019 1207 07/28/2019 1207 08/04/19 0500 08/05/19 0427 08/06/19 0435  AST 27  --  20  --  17  ALT 17  --  15  --  17  ALKPHOS 212*  --  160*  --  155*  BILITOT 1.3*  --  0.6  --  0.9  PROT 5.4*  --  4.6*  --  4.7*  ALBUMIN 1.9*   < > 1.5* 1.3* 1.4*   < > = values in this interval not displayed.   CBC: Recent Labs  Lab 07/30/2019 1207 07/29/2019 2247 08/03/19 0943 08/03/19 0943 08/04/19 0500 08/05/19 0427 08/06/19 0435  WBC 17.5*  --  17.8*   < > 16.8* 11.8* 13.0*  NEUTROABS 14.5*  --   --   --  13.9* 8.9* 10.0*  HGB 11.2*   < > 9.4*   < > 8.9* 8.4* 9.3*  HCT 36.9   < > 30.9*   < > 29.7* 28.5* 30.6*  MCV 95.8  --  96.3  --  93.7 96.6 97.1  PLT 235  --  263   <  > 219 203 203   < > = values in this interval not displayed.   Blood Culture    Component Value Date/Time   SDES BLOOD LEFT HAND 08/07/2019 1302   SPECREQUEST  08/14/2019 1302    BOTTLES DRAWN AEROBIC AND ANAEROBIC Blood Culture adequate volume   CULT  08/26/2019 1302    NO GROWTH 3 DAYS Performed at [facility], [place] [address] [place], [place] [address]    REPTSTATUS PENDING 07/29/2019 1302   CBG: Recent Labs  Lab 08/05/19 2040 08/05/19 2358 08/06/19 0425 08/06/19 0816 08/06/19 1235  GLUCAP 83 80 78 76 70   Medications: . cefTRIAXone (ROCEPHIN)  IV 2 g (08/06/19 1240)  . vancomycin Stopped (08/05/19 1035)   . atorvastatin  20 mg Oral Daily  . Chlorhexidine Gluconate Cloth  6 each Topical Q0600  . clonazePAM  0.25 mg Oral Q T,Th,Sa-HD  . clonazePAM  0.5 mg Oral  QHS  . collagenase   Topical Daily  . [START ON 08/29/2019] Darbepoetin Alfa  25 mcg Intravenous Q Tue-HD  . doxercalciferol  3 mcg Intravenous Q T,Th,Sa-HD  . FLUoxetine  10 mg Oral Daily  . heparin  5,000 Units Subcutaneous Q8H  . levothyroxine  112 mcg Oral QAC breakfast  . midodrine  10 mg Oral Once per day on Tue Thu Sat  . montelukast  10 mg Oral QHS  . pantoprazole  40 mg Oral QAC breakfast  . sevelamer carbonate  1,600 mg Oral TID WC  . sodium chloride flush  3 mL Intravenous Q12H    D[facility]: [facility] 4h 400/800 EDW 80kg 2K/2Ca UFP 4 Hectorol 3 TIW Mircera 60 mcg IV q 2 weeks (last 7/1)  Assessment/Plan: 1. Sepsis2/2 ?cellulitis-Met SIRS criteria on admission. Likely secondary to purulent wounds to groin/thighs. WBC 16.8>11.8.Blood cultures NGTD.UC sent.  IV Vanc/Rocephin per primary.  2. ESRD -HD TTS.K 4.1.  Next HD 7/13. 3. Hypotension/volume - Chronic hypotension on midodrine for BP support with HD. CXR showed chronic lower lung changes with tiny R pleural effusion, upper lungs clear. Volume status improved with HD. Titrate down volume as tolerated. 4. Anemia - Hgb  trending up, 9.3 today. Follow labs 5. Metabolic bone disease -Ca and phos in goal. Continue binders/Hectorol. 6. COPD/Chronic resp failure - 5L JAARS at baseline. 7. Dementia-Appears at baseline today.  8. Multiple pressure wounds to groin/thighs- per [place] 9. Nutrition -PCM. Liberlize diet as tolerated. Prot supp for low albumin 10. DMT2 - per primary  Jen Mow, PA-C [facility] Pager: [phone] 08/06/2019,1:36 PM  LOS: 4 days

## 2019-08-06 NOTE — Consult Note (Signed)
Surgical Evaluation Requesting provider: Dr. Marylyn Ishihara  Chief Complaint: wounds  HPI: History is taken from chart review and speaking with the patient's sons as she is minimally cooperative and unable to provide a history.  74 year old woman with multiple significant medical problems including COPD on home oxygen, end-stage renal disease on dialysis, dementia, morbid obesity and diabetes who was brought to the hospital for treatment of multiple wounds on her inner thighs and left hip.  They have had a difficult time getting home health care and were told that none was available until August.  On presentation she had a leukocytosis, tachycardia and tachypnea and reportedly cellulitis.  Blood cultures negative to date, urine cultures pending.  She was initiated on empiric Vanco and Rocephin.  While her white blood cell count has improved, it has not completely normalized.  Surgery is consulted to evaluate the wounds for any debridement needs.  Per her sons the patient really does not move herself.  Poor appetite.  Allergies  Allergen Reactions  . Avelox [Moxifloxacin Hcl In Nacl] Other (See Comments)    Unknown reaction  . Codeine Anaphylaxis  . Penicillins Rash    Did it involve swelling of the face/tongue/throat, SOB, or low BP? Unk Did it involve sudden or severe rash/hives, skin peeling, or any reaction on the inside of your mouth or nose? Yes Did you need to seek medical attention at a hospital or doctor's office? Unk When did it last happen? Unk If all above answers are "NO", may proceed with cephalosporin use.   . Sulfa Antibiotics Other (See Comments)    Unknown reaction  . Dextromethorphan-Guaifenesin Other (See Comments)    Reported by [facility] - unknown reaction. Pt states she is not allergic to dextromethorphan-guaifenesin.  . Shellfish Allergy Hives, Itching and Rash    Past Medical History:  Diagnosis Date  . Arthritis   . COPD (chronic obstructive pulmonary disease) ([place])   .  Diabetic neuropathy ([place])   . ESRD on dialysis [facility])    ESRD  . 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 Sleep apnea    wears BPAP at home at night  . Thyroid disease   . TIA (transient ischemic attack)   . Type 2 diabetes mellitus with peripheral neuropathy [facility])     Past Surgical History:  Procedure Laterality Date  . ABDOMINAL HYSTERECTOMY    . ENDOSCOPIC RETROGRADE CHOLANGIOPANCREATOGRAPHY (ERCP) WITH PROPOFOL N/A 06/02/2019   Procedure: ENDOSCOPIC RETROGRADE CHOLANGIOPANCREATOGRAPHY (ERCP) WITH PROPOFOL;  Surgeon: [name], MD;  Location: [place];  Service: Gastroenterology;  Laterality: N/A;  . IR FLUORO GUIDE CV LINE RIGHT  11/14/2018  . IR FLUORO GUIDE CV LINE RIGHT  08/03/2019  . IR US GUIDE VASC ACCESS RIGHT  08/03/2019  . RECTOCELE REPAIR    . REMOVAL OF STONES  06/02/2019   Procedure: REMOVAL OF STONES;  Surgeon: [name], MD;  Location: [facility] ENDOSCOPY;  Service: Gastroenterology;;  . REPLACEMENT TOTAL KNEE BILATERAL Bilateral 2008  . SPHINCTEROTOMY  06/02/2019   Procedure: SPHINCTEROTOMY;  Surgeon: [name], MD;  Location: [facility] ENDOSCOPY;  Service: Gastroenterology;;  . THYROIDECTOMY     80%  . VAGINAL WOUND CLOSURE / REPAIR      Family History  Problem Relation Age of Onset  . Heart disease Mother   . Hypertension Mother   . Heart disease Father   . Hypertension Father     Social History   Socioeconomic History  . Marital status: Widowed  Spouse name: Not on file  . Number of children: 5  . Years of education: Not on file  . Highest education level: Not on file  Occupational History    Comment: disabled  Tobacco Use  . Smoking status: Former Smoker    Packs/day: 1.00    Years: 35.00    Pack years: 35.00    Quit date: 03/29/1981    Years since quitting: 38.3  . Smokeless tobacco: Never Used  Vaping Use  . Vaping Use: Never used  Substance and Sexual Activity  . Alcohol use: Never  . Drug use: Never  . Sexual activity: Not  Currently  Other Topics Concern  . Not on file  Social History Narrative  . Not on file   Social Determinants of Health   Financial Resource Strain:   . Difficulty of Paying Living Expenses:   Food Insecurity:   . Worried About Charity fundraiser in the Last Year:   . Arboriculturist in the Last Year:   Transportation Needs:   . Film/video editor (Medical):   Marland Kitchen Lack of Transportation (Non-Medical):   Physical Activity:   . Days of Exercise per Week:   . Minutes of Exercise per Session:   Stress:   . Feeling of Stress :   Social Connections:   . Frequency of Communication with Friends and Family:   . Frequency of Social Gatherings with Friends and Family:   . Attends Religious Services:   . Active Member of Clubs or Organizations:   . Attends Archivist Meetings:   Marland Kitchen Marital Status:     No current facility-administered medications on file prior to encounter.   Current Outpatient Medications on File Prior to Encounter  Medication Sig Dispense Refill  . acetaminophen (TYLENOL) 325 MG tablet Take 650 mg by mouth every 6 (six) hours as needed (for arthritic pain).     Marland Kitchen atorvastatin (LIPITOR) 20 MG tablet Take 1 tablet (20 mg total) by mouth daily. 90 tablet 1  . clonazePAM (KLONOPIN) 0.5 MG tablet Take 0.25 mg on Tues, Thurs & Sat only and 0.5 mg in the evening (Patient taking differently: Take 0.25-0.5 mg by mouth See admin instructions. Take 0.25 mg by mouth on Tues, Thurs & Sat before dialysis only and 0.5 mg at bedtime every night) 42 tablet 0  . collagenase (SANTYL) ointment Apply topically daily. (Patient taking differently: Apply 1 application topically See admin instructions. Apply daily as directed to affected areas of right hip, left thigh, and groin area) 15 g 0  . docusate sodium (COLACE) 100 MG capsule Take 100 mg by mouth every morning.    Marland Kitchen FLUoxetine (PROZAC) 10 MG tablet Take 1 tablet (10 mg total) by mouth daily. 90 tablet 1  . furosemide (LASIX)  20 MG tablet Take 40 mg by mouth See admin instructions. Take 40 mg by mouth in the morning on non-dialysis days: Sun/Mon/Wed/Fri    . ipratropium-albuterol (DUONEB) 0.5-2.5 (3) MG/3ML SOLN Take 3 mLs by nebulization 4 (four) times daily.    Marland Kitchen levalbuterol (XOPENEX HFA) 45 MCG/ACT inhaler Inhale 2 puffs into the lungs every 4 (four) hours as needed for wheezing. 15 g 0  . levothyroxine (EUTHYROX) 112 MCG tablet Take 1 tablet (112 mcg total) by mouth daily before breakfast. 90 tablet 0  . Lubricants (K-Y LUBRICANT JELLY SENSITIVE EX) Place 1 application into both nostrils as needed (as directed- for lubrication).     . midodrine (PROAMATINE) 10 MG tablet  Take 1 tablet (10 mg total) by mouth 3 (three) times daily. (Patient taking differently: Take 10 mg by mouth 3 (three) times a week. Take 10 mg by mouth three times a week, on Tues/Thurs/Sat- 45 minutes prior to HD treatment) 90 tablet 0  . montelukast (SINGULAIR) 10 MG tablet Take 1 tablet (10 mg total) by mouth at bedtime. 90 tablet 1  . nystatin (MYCOSTATIN/NYSTOP) powder Apply 1 application topically 2 (two) times daily as needed (as directed- to any rashes). (Patient taking differently: Apply 1 application topically 2 (two) times daily as needed (as directed, under skin folds and/or under the breasts). ) 15 g 0  . nystatin cream (MYCOSTATIN) Apply 1 application topically 2 (two) times daily as needed (as directed, under skin folds and/or under the breasts).    . OXYGEN Inhale 5-6 L/min into the lungs continuous.     Marland Kitchen oxymetazoline (AFRIN) 0.05 % nasal spray Place 1 spray into both nostrils 2 (two) times daily as needed for congestion.    . pantoprazole (PROTONIX) 40 MG tablet Take 1 tablet (40 mg total) by mouth daily before breakfast. 90 tablet 1  . PRESCRIPTION MEDICATION See admin instructions. "BiPAP IPAP 18, EPAP 5, flow rate 5": At bedtime and during the day as needed for shortness of breath when napping    . sevelamer carbonate (RENVELA) 800  MG tablet Take 1 tablet (800 mg total) by mouth 3 (three) times daily with meals. (Patient taking differently: Take 1,600 mg by mouth 3 (three) times daily with meals. And 1 tablet with a snack) 90 tablet 0  . SIMPLY SALINE NA Place 2 sprays into both nostrils as needed (for congestion).     . trolamine salicylate (ASPER-FLEX) 10 % cream Apply 1 application topically as needed for muscle pain (to painful sites).    . Darbepoetin Alfa (ARANESP) 25 MCG/0.42ML SOSY injection Inject 0.42 mLs (25 mcg total) into the vein every Tuesday with hemodialysis. 14.28 mL   . doxercalciferol (HECTOROL) 4 MCG/2ML injection Inject 1.5 mLs (3 mcg total) into the vein Every Tuesday,Thursday,and Saturday with dialysis. 2 mL   . Methoxy PEG-Epoetin Beta (MIRCERA IJ) Mircera    . [DISCONTINUED] heparin 5000 UNIT/ML injection Inject 1.5 mLs (7,500 Units total) into the skin every 8 (eight) hours. 1 mL     Review of Systems: a complete, 10pt review of systems was unable to be completed due to patient mental status  Physical Exam: Vitals:   08/06/19 0814 08/06/19 1233  BP: (!) 108/52 121/77  Pulse: 93 100  Resp: 19 (!) 22  Temp: 97.6 F (36.4 C) 97.6 F (36.4 C)  SpO2: 100%    Gen: Elderly Caucasian female, chronically ill-appearing Chest: respiratory effort is normal.  Cardiovascular: RRR  Skin: warm and dry.  On bilateral medial proximal thighs there are superficial wounds with fibrinous exudate, no surrounding erythema, induration, warmth or underlying fluctuance.  No purulence.  On the left lateral hip there is an approximately 6 x 5 cm superficial wound with fibrinous exudate, pale appearing underlying fat with minimal granulation.  No surrounding cellulitis, no tunneling.   CBC Latest Ref Rng & Units 08/06/2019 08/05/2019 08/04/2019  WBC 4.0 - 10.5 K/uL 13.0(H) 11.8(H) 16.8(H)  Hemoglobin 12.0 - 15.0 g/dL 9.3(L) 8.4(L) 8.9(L)  Hematocrit 36 - 46 % 30.6(L) 28.5(L) 29.7(L)  Platelets 150 - 400 K/uL 203 203  219    CMP Latest Ref Rng & Units 08/06/2019 08/05/2019 08/04/2019  Glucose 70 - 99 mg/dL 89 92 127(H)  BUN 8 - 23 mg/dL 6(L) 11 7(L)  Creatinine 0.44 - 1.00 mg/dL 1.76(H) 2.23(H) 1.69(H)  Sodium 135 - 145 mmol/L 137 138 138  Potassium 3.5 - 5.1 mmol/L 4.1 4.0 3.7  Chloride 98 - 111 mmol/L 100 100 100  CO2 22 - 32 mmol/L 28 27 29   Calcium 8.9 - 10.3 mg/dL 9.7 9.3 8.5(L)  Total Protein 6.5 - 8.1 g/dL 4.7(L) - 4.6(L)  Total Bilirubin 0.3 - 1.2 mg/dL 0.9 - 0.6  Alkaline Phos 38 - 126 U/L 155(H) - 160(H)  AST 15 - 41 U/L 17 - 20  ALT 0 - 44 U/L 17 - 15    Lab Results  Component Value Date   INR 1.0 08/25/2019   INR 1.0 06/27/2019   INR 1.0 05/04/2019    Imaging: No results found.   A/P: Multiple decubitus wounds.  These do not appear to be infected or in need of surgical debridement and some improvement is already noted in comparison to WOCN note 3 days ago.  Would continue local wound care as directed by wound care nurse.  Reinforced to patient's sons that the most important thing for these to heal is good nutrition, improved mobility, local wound care.  Surgery will sign off.    Patient Active Problem List   Diagnosis Date Noted  . DNR (do not resuscitate)   . Sepsis due to cellulitis ([place]) 08/17/2019  . Major depression, recurrent, chronic ([place]) 07/14/2019  . Gastroesophageal reflux disease without esophagitis 07/14/2019  . History of CVA (cerebrovascular accident) 07/14/2019  . Hyponatremia 06/28/2019  . Candidal skin infection 06/28/2019  . UTI (urinary tract infection) 06/27/2019  . Unspecified protein-calorie malnutrition ([place]) 06/07/2019  . Pressure injury of skin 06/02/2019  . Urinary tract infection without hematuria 05/31/2019  . Symptomatic anemia 05/04/2019  . Neurocognitive deficits 04/27/2019  . Adult failure to thrive   . Encephalopathy, unspecified 04/20/2019  . Stroke ([place]) 04/20/2019  . Dysphagia 03/29/2019  . Aspiration into airway   . COVID-19  02/08/2019  . Hypotension 12/24/2018  . Aspiration pneumonia of right lower lobe ([place])   . COPD (chronic obstructive pulmonary disease) ([place]) 11/11/2018  . TIA (transient ischemic attack)   . HLD (hyperlipidemia)   . Type II diabetes mellitus with renal manifestations ([place])   . Depression with anxiety   . Advanced care planning/counseling discussion   . Goals of care, counseling/discussion   . Palliative care by specialist   . Chronic respiratory failure ([place]) 09/08/2018  . Debility   . Chronic respiratory failure with hypoxia, on home O2 therapy ([place]) 09/06/2018  . Nontraumatic subdural hemorrhage, unspecified ([place]) 08/22/2018  . Elevated troponin 08/22/2018  . ESRD on dialysis ([place]) 08/22/2018  . Anxiety 07/04/2018  . Counseling regarding advanced directives and goals of care 07/01/2018  . Full code status 07/01/2018  . Drug-induced constipation 07/01/2018  . Hemodialysis-associated hypotension 07/01/2018  . Dialysis patient ([place]) 07/01/2018  . HOH (hard of hearing) 07/01/2018  . Stage 5 chronic kidney disease on chronic dialysis ([place]) 07/01/2018  . Type 2 diabetes mellitus with diabetic polyneuropathy, with long-term current use of insulin ([place]) 07/01/2018  . Hypothyroidism 07/01/2018  . Gait instability 07/01/2018  . Hyperkalemia 04/20/2018  . Cerebral infarction, unspecified ([place]) 04/05/2018  . Chronic diastolic (congestive) heart failure ([place]) 04/05/2018  . Diarrhea, unspecified 04/05/2018  . Pain, unspecified 04/05/2018  . Pruritus, unspecified 04/05/2018  . Shortness of breath 04/05/2018  . Coagulation defect, unspecified ([place]) 03/31/2018  . Anemia in chronic kidney disease  03/25/2018  . Iron deficiency anemia, unspecified 03/25/2018  . Secondary hyperparathyroidism of renal origin [facility]) 03/25/2018       Romana Juniper, MD [name]  See AMION to contact appropriate on-call provider

## 2019-08-06 NOTE — Progress Notes (Signed)
PROGRESS NOTE    Joan Mosley  DVV:[id number] DOB: February 20, 1945 DOA: 08/15/2019 PCP: Flossie Buffy, NP   Brief Narrative:   Joan Mosley a 74 y.o.femalewith medical history significant ofTIA; COPD on home5LO2; ESRD on TTS HD;dementia;morbid obesity (BMI 47); and DM presenting with AMS.Her son reports that she came in for "all those bedsores". She was here recently, trying to get home wound care but they aren't available until August. Her wounds are continuing to get worse. No change in her mental status, she is "extremely forgetful" at baseline. No fever. She does TTS HD.  7/11: Spoke with surgery about wounds. They will examine. Continuing IV abx for now pending their opinion. Hypoglycemic last night and again this AM. Encourage diet. Add remeron. Appreciate all consultants assistance.   Assessment & Plan:   Principal Problem:   Sepsis due to cellulitis [facility]) Active Problems:   Hypothyroidism   ESRD on dialysis ([place])   Chronic respiratory failure with hypoxia, on home O2 therapy ([place])   Advanced care planning/counseling discussion   Type II diabetes mellitus with renal manifestations (HCC)   COPD (chronic obstructive pulmonary disease) (HCC)   Hypotension   Anemia in chronic kidney disease   Neurocognitive deficits   DNR (do not resuscitate)  Sepsis due to wound infection Acute metabolic encephalopathy - SIRS criteria in this patient includes: Leukocytosis, tachycardia, tachypnea  - Patient has evidence of acute organ failure with elevated lactate >2 that is not easily explained by another condition. - Suspected source is wounds/purulent cellulitis - Bld Cx NTD, UCX pending - IJ established; start rocephin/vanc - lactic acid is improved; follow - 7/11: WBC stable, but elevated; continuing abx, will have surgery weigh in, appreciate assistance  Chronic respiratory failure associated with COPD - Patient is on 5L home O2  and uses BIPAP qhs; continue O2 support here - Continue Duoneb QID and Xopenex prn - Continue Singulair     - stable on 5L [place]; BiPap ordered for qHS.  ESRD on HD - Patient on chronic TTS HD - continue Renvela, Aranesp, Hectorol for CKD-associated anemia     - per nephrology  Hypotension - likely sepsis related - she does take midodrine on HD days; continue - Hold Lasix - PRN fluid boluses     - 7/11: BP stable today  HLD - atorvastatin  Dementia - Per admitting physician, son says this is at/near baseline - Continue Klonopin, Prozac  Goals of care - Patient has severe COPD and ESRD at baseline - Now with dementia - Progressive weakness, wheelchair-dependent, does not leave the bed - PC consulted; HCPOA: DNR, continue current TX  DM2 w/ hypoglycemia - 7/11: continue to encourage diet; had some hypoglycemia ON     - add remeron and nutrition consult  Hypothyroidism - continue synthroid  Obesity - Body mass index is 34.99 kg/m. - She is immobile and this appears unlikely to improve  Multiple wounds all POA - unstagable left inner/upper thigh wound - unstagable right inner upper thigh wound - unstagable right trohchanter wound     - unstagable left gluteal wound - WOC addressing, see her note     - continue abx; will need wound care at discharge     - 7/11: have asked gen surg to assess, appreciate assistance  DVT prophylaxis: heparin Code Status: DNR Family Communication: Spoke with son by phone and updated with plan   Status is: Inpatient  Remains inpatient appropriate because:Inpatient level of care appropriate due to severity of illness  Dispo: The patient is from: Home              Anticipated d/c is to: Home              Anticipated d/c date is: 3 days              Patient currently is not medically stable to d/c.  Consultants:    Nephrology  Palliative Care  General Surgery  Procedures:   IJ Placement  Antimicrobials:  . Rocephin, vancomycin   ROS:  Denies dyspnea, N, ab pain, CP, palpitations. Remainder 10-pt ROS is negative for all not previously mentioned.  Subjective: "That isn't helping."  Objective: Vitals:   08/05/19 2041 08/05/19 2120 08/05/19 2330 08/06/19 0408  BP: 115/82  106/79 105/79  Pulse: 100  100 100  Resp: (!) 22  20 20   Temp: 98.3 F (36.8 C)  98 F (36.7 C) 97.7 F (36.5 C)  TempSrc: Oral  Oral Oral  SpO2: 97% 91% 94% 95%  Weight:        Intake/Output Summary (Last 24 hours) at 08/06/2019 0753 Last data filed at 08/05/2019 1300 Gross per 24 hour  Intake 0 ml  Output 1334 ml  Net -1334 ml   Filed Weights   08/03/19 2320 08/04/19 0320 08/05/19 0337  Weight: 84 kg 82.5 kg 110.7 kg    Examination:  General: 74 y.o. female resting in bed in NAD Cardiovascular: RRR, +S1, S2, no m/g/r Respiratory: CTABL, no w/r/r, normal WOB on 5L [place] GI: BS+, NDNT, soft MSK: No e/c/c; multiple wounds of inner thighs and L/R hips Neuro: A&O x 3, no focal deficits Psyc: calm/cooperative   Data Reviewed: I have personally reviewed following labs and imaging studies.  CBC: Recent Labs  Lab 07/30/2019 1207 08/11/2019 1207 08/21/2019 2247 08/03/19 0943 08/04/19 0500 08/05/19 0427 08/06/19 0435  WBC 17.5*  --   --  17.8* 16.8* 11.8* 13.0*  NEUTROABS 14.5*  --   --   --  13.9* 8.9* 10.0*  HGB 11.2*   < > 10.2* 9.4* 8.9* 8.4* 9.3*  HCT 36.9   < > 30.0* 30.9* 29.7* 28.5* 30.6*  MCV 95.8  --   --  96.3 93.7 96.6 97.1  PLT 235  --   --  263 219 203 203   < > = values in this interval not displayed.   Basic Metabolic Panel: Recent Labs  Lab 08/18/2019 1207 07/28/2019 1207 08/16/2019 2247 08/03/19 0943 08/04/19 0500 08/05/19 0427 08/06/19 0435  NA 136   < > 134* 135 138 138 137  K 3.0*   < > 3.4* 3.6 3.7 4.0 4.1  CL 94*  --   --  95* 100 100 100  CO2 25  --   --  24 29 27 28    GLUCOSE 113*  --   --  88 127* 92 89  BUN 18  --   --  23 7* 11 6*  CREATININE 2.85*  --   --  3.36* 1.69* 2.23* 1.76*  CALCIUM 9.4  --   --  9.1 8.5* 9.3 9.7  MG  --   --   --   --  1.7 1.8 1.7  PHOS  --   --   --   --   --  3.7  --    < > = values in this interval not displayed.   GFR: Estimated Creatinine Clearance: 31.3 mL/min (A) (by C-G formula based on SCr of 1.76 mg/dL (  H)). Liver Function Tests: Recent Labs  Lab 08/10/2019 1207 08/04/19 0500 08/05/19 0427 08/06/19 0435  AST 27 20  --  17  ALT 17 15  --  17  ALKPHOS 212* 160*  --  155*  BILITOT 1.3* 0.6  --  0.9  PROT 5.4* 4.6*  --  4.7*  ALBUMIN 1.9* 1.5* 1.3* 1.4*   No results for input(s): LIPASE, AMYLASE in the last 168 hours. No results for input(s): AMMONIA in the last 168 hours. Coagulation Profile: Recent Labs  Lab 08/21/2019 1207  INR 1.0   Cardiac Enzymes: No results for input(s): CKTOTAL, CKMB, CKMBINDEX, TROPONINI in the last 168 hours. BNP (last 3 results) Recent Labs    04/10/19 1418  PROBNP 81.0   HbA1C: No results for input(s): HGBA1C in the last 72 hours. CBG: Recent Labs  Lab 08/05/19 1601 08/05/19 1620 08/05/19 2040 08/05/19 2358 08/06/19 0425  GLUCAP 65* 90 83 80 78   Lipid Profile: No results for input(s): CHOL, HDL, LDLCALC, TRIG, CHOLHDL, LDLDIRECT in the last 72 hours. Thyroid Function Tests: No results for input(s): TSH, T4TOTAL, FREET4, T3FREE, THYROIDAB in the last 72 hours. Anemia Panel: No results for input(s): VITAMINB12, FOLATE, FERRITIN, TIBC, IRON, RETICCTPCT in the last 72 hours. Sepsis Labs: Recent Labs  Lab 07/31/2019 1207 08/26/2019 1557 08/10/2019 2308 08/03/19 0943 08/04/19 0500  PROCALCITON  --   --  >150.00 >150.00 97.16  LATICACIDVEN 2.2* 2.5* 3.8* 1.2  --     Recent Results (from the past 240 hour(s))  Blood Culture (routine x 2)     Status: None (Preliminary result)   Collection Time: 07/27/2019 11:41 AM   Specimen: BLOOD LEFT HAND  Result Value Ref  Range Status   Specimen Description BLOOD LEFT HAND  Final   Special Requests   Final    BOTTLES DRAWN AEROBIC AND ANAEROBIC Blood Culture results may not be optimal due to an inadequate volume of blood received in culture bottles   Culture   Final    NO GROWTH 3 DAYS Performed at [facility], [place] [address] [place], [place] [address]    Report Status PENDING  Incomplete  Blood Culture (routine x 2)     Status: None (Preliminary result)   Collection Time: 08/04/2019  1:02 PM   Specimen: BLOOD LEFT HAND  Result Value Ref Range Status   Specimen Description BLOOD LEFT HAND  Final   Special Requests   Final    BOTTLES DRAWN AEROBIC AND ANAEROBIC Blood Culture adequate volume   Culture   Final    NO GROWTH 3 DAYS Performed at [facility], [place] [address] [place], [place] [address]    Report Status PENDING  Incomplete  SARS Coronavirus 2 by RT PCR (hospital order, performed in [facility] hospital lab) Nasopharyngeal Nasopharyngeal Swab     Status: None   Collection Time: 08/01/2019  4:53 PM   Specimen: Nasopharyngeal Swab  Result Value Ref Range Status   SARS Coronavirus 2 NEGATIVE NEGATIVE Final    Comment: (NOTE) SARS-CoV-2 target nucleic acids are NOT DETECTED.  The SARS-CoV-2 RNA is generally detectable in upper and lower respiratory specimens during the acute phase of infection. The lowest concentration of SARS-CoV-2 viral copies this assay can detect is 250 copies / mL. A negative result does not preclude SARS-CoV-2 infection and should not be used as the sole basis for treatment or other patient management decisions.  A negative result may occur with improper specimen collection /  handling, submission of specimen other than nasopharyngeal swab, presence of viral mutation(s) within the areas targeted by this assay, and inadequate number of viral copies (<250 copies / mL). A negative result must be combined with clinical observations, patient history, and  epidemiological information.  Fact Sheet for Patients:   StrictlyIdeas.no  Fact Sheet for Healthcare Providers: BankingDealers.co.za  This test is not yet approved or  cleared by the [place] FDA and has been authorized for detection and/or diagnosis of SARS-CoV-2 by FDA under an Emergency Use Authorization (EUA).  This EUA will remain in effect (meaning this test can be used) for the duration of the COVID-19 declaration under Section 564(b)(1) of the Act, 21 U.S.C. section 360bbb-3(b)(1), unless the authorization is terminated or revoked sooner.  Performed at [facility], [place] [address], [place], [place] [address]       Radiology Studies: No results found.   Scheduled Meds: . atorvastatin  20 mg Oral Daily  . Chlorhexidine Gluconate Cloth  6 each Topical Q0600  . clonazePAM  0.25 mg Oral Q T,Th,Sa-HD  . clonazePAM  0.5 mg Oral QHS  . collagenase   Topical Daily  . [START ON 2019-09-07] Darbepoetin Alfa  25 mcg Intravenous Q Tue-HD  . doxercalciferol  3 mcg Intravenous Q T,Th,Sa-HD  . FLUoxetine  10 mg Oral Daily  . heparin  5,000 Units Subcutaneous Q8H  . levothyroxine  112 mcg Oral QAC breakfast  . midodrine  10 mg Oral Once per day on Tue Thu Sat  . montelukast  10 mg Oral QHS  . pantoprazole  40 mg Oral QAC breakfast  . sevelamer carbonate  1,600 mg Oral TID WC  . sodium chloride flush  3 mL Intravenous Q12H   Continuous Infusions: . cefTRIAXone (ROCEPHIN)  IV 2 g (08/05/19 1119)  . vancomycin Stopped (08/05/19 1035)     LOS: 4 days    Time spent: 35 minutes spent in the coordination of care today.    Jonnie Finner, DO [facility]  If 7PM-7AM, please contact night-coverage [email or web address] 08/06/2019, 7:53 AM

## 2019-08-06 NOTE — Progress Notes (Signed)
   Palliative Medicine Inpatient Follow Up Note  HPI: 74 y.o. female  with past medical history of TIA, COPD on 5L oxygen at home, ESRD on HD, dementia, and DM admitted on 08/05/2019 with AMS. Found to have multiple wounds. Diagnosed with sepsis likely d/t draining ulcers on hips and groin.  Night of 7/7 patient more hypoxic - ABG found PaO2 of 47. Patient also with tachycardia. No acute disease on CXR so CT angiogram   Patient is well known to PMT - we have seen multiple times during previous hospitalizations.  Today's Discussion (08/06/2019): Chart reviewed. Spoke to bedside RN, Jerene Pitch. Evaluated miss Beechy at bedside. She certainly appears more frail than when I saw her a few months ago. Per nursing the team may opt for surgical involvement to debride her hip. Her family at this time remains to with for her to get HD.   I called patients son [name], he had just spoke to Dr. Marylyn Ishihara and received a medical update. I asked him if he had any additional questions that I could help clarify. He stated that he did not and he knows that his mother will either progress or regress. He does not feel that she is at this point ready for hospice care. He is in full agreement with the DNAR/DNI and planning for her to transition home later in the week. He feels comfortable with her present living situation and does feel that his siblings are making their best effort to care for [facility].   Discussed the importance of continued conversation with family and their  medical providers regarding overall plan of care and treatment options, ensuring decisions are within the context of the patients values and GOCs.  Questions and concerns addressed   SUMMARY OF RECOMMENDATIONS   - DNR per son and HCPOA. MOST on filed is reflective of present wishes - continue current measures and allow time for outcomes - not eligible for hospice at this time as family is interested in continuing current measures including HD - PMT will  shadow chart, will follow up with son depending on clinical condition  Time Spent: 25 Greater than 50% of the time was spent in counseling and coordination of care ______________________________________________________________________________________ [facility] Team Team Cell Phone: [phone] Please utilize secure chat with additional questions, if there is no response within 30 minutes please call the above phone number  Palliative Medicine Team providers are available by phone from 7am to 7pm daily and can be reached through the team cell phone.  Should this patient require assistance outside of these hours, please call the patient's attending physician.

## 2019-08-07 ENCOUNTER — Inpatient Hospital Stay (HOSPITAL_COMMUNITY): Payer: Medicare Other

## 2019-08-07 LAB — BLOOD GAS, ARTERIAL
Acid-Base Excess: 2.6 mmol/L — ABNORMAL HIGH (ref 0.0–2.0)
Acid-Base Excess: 2.2 mmol/L — ABNORMAL HIGH (ref 0.0–2.0)
Bicarbonate: 26.9 mmol/L (ref 20.0–28.0)
Bicarbonate: 26.9 mmol/L (ref 20.0–28.0)
Drawn by: 51155
Drawn by: 336832
FIO2: 32
FIO2: 44
O2 Saturation: 89.3 %
O2 Saturation: 90.1 %
Patient temperature: 36.5
Patient temperature: 36.2
pCO2 arterial: 42.5 mmHg (ref 32.0–48.0)
pCO2 arterial: 45.4 mmHg (ref 32.0–48.0)
pH, Arterial: 7.416 (ref 7.350–7.450)
pH, Arterial: 7.387 (ref 7.350–7.450)
pO2, Arterial: 52.4 mmHg — ABNORMAL LOW (ref 83.0–108.0)
pO2, Arterial: 57.7 mmHg — ABNORMAL LOW (ref 83.0–108.0)

## 2019-08-07 LAB — CULTURE, BLOOD (ROUTINE X 2)
Culture: NO GROWTH
Culture: NO GROWTH
Special Requests: ADEQUATE

## 2019-08-07 LAB — RENAL FUNCTION PANEL
Albumin: 1.4 g/dL — ABNORMAL LOW (ref 3.5–5.0)
Anion gap: 10 (ref 5–15)
BUN: 14 mg/dL (ref 8–23)
CO2: 27 mmol/L (ref 22–32)
Calcium: 10.2 mg/dL (ref 8.9–10.3)
Chloride: 101 mmol/L (ref 98–111)
Creatinine, Ser: 2.63 mg/dL — ABNORMAL HIGH (ref 0.44–1.00)
GFR calc Af Amer: 20 mL/min — ABNORMAL LOW (ref >60)
GFR calc non Af Amer: 17 mL/min — ABNORMAL LOW (ref >60)
Glucose, Bld: 86 mg/dL (ref 70–99)
Phosphorus: 4.1 mg/dL (ref 2.5–4.6)
Potassium: 4.2 mmol/L (ref 3.5–5.1)
Sodium: 138 mmol/L (ref 135–145)

## 2019-08-07 LAB — GLUCOSE, CAPILLARY
Glucose-Capillary: 71 mg/dL (ref 70–99)
Glucose-Capillary: 78 mg/dL (ref 70–99)
Glucose-Capillary: 67 mg/dL — ABNORMAL LOW (ref 70–99)
Glucose-Capillary: 69 mg/dL — ABNORMAL LOW (ref 70–99)
Glucose-Capillary: 108 mg/dL — ABNORMAL HIGH (ref 70–99)

## 2019-08-07 LAB — CBC WITH DIFFERENTIAL/PLATELET
Abs Immature Granulocytes: 0.22 10*3/uL — ABNORMAL HIGH (ref 0.00–0.07)
Basophils Absolute: 0 10*3/uL (ref 0.0–0.1)
Basophils Relative: 0 %
Eosinophils Absolute: 0.3 10*3/uL (ref 0.0–0.5)
Eosinophils Relative: 2 %
HCT: 31.5 % — ABNORMAL LOW (ref 36.0–46.0)
Hemoglobin: 9.1 g/dL — ABNORMAL LOW (ref 12.0–15.0)
Immature Granulocytes: 1 %
Lymphocytes Relative: 12 %
Lymphs Abs: 1.8 10*3/uL (ref 0.7–4.0)
MCH: 28 pg (ref 26.0–34.0)
MCHC: 28.9 g/dL — ABNORMAL LOW (ref 30.0–36.0)
MCV: 96.9 fL (ref 80.0–100.0)
Monocytes Absolute: 0.9 10*3/uL (ref 0.1–1.0)
Monocytes Relative: 6 %
Neutro Abs: 12.3 10*3/uL — ABNORMAL HIGH (ref 1.7–7.7)
Neutrophils Relative %: 79 %
Platelets: 181 10*3/uL (ref 150–400)
RBC: 3.25 MIL/uL — ABNORMAL LOW (ref 3.87–5.11)
RDW: 21.8 % — ABNORMAL HIGH (ref 11.5–15.5)
WBC: 15.5 10*3/uL — ABNORMAL HIGH (ref 4.0–10.5)
nRBC: 0 % (ref 0.0–0.2)

## 2019-08-07 LAB — MAGNESIUM: Magnesium: 1.8 mg/dL (ref 1.7–2.4)

## 2019-08-07 MED ORDER — TECHNETIUM TO 99M ALBUMIN AGGREGATED
4.0000 | Freq: Once | INTRAVENOUS | Status: AC | PRN
  Administered 2019-08-07: 4 via INTRAVENOUS

## 2019-08-07 MED ORDER — DEXTROSE 50 % IV SOLN
INTRAVENOUS | Status: AC
  Administered 2019-08-07: 12.5 g via INTRAVENOUS
  Filled 2019-08-07: qty 50

## 2019-08-07 MED ORDER — ACETAMINOPHEN 325 MG PO TABS: 650.0000 mg | ORAL_TABLET | Freq: Four times a day (QID) | ORAL | Status: DC | PRN

## 2019-08-07 MED ORDER — NEPRO/CARBSTEADY PO LIQD: 237.0000 mL | Freq: Three times a day (TID) | ORAL | Status: DC

## 2019-08-07 MED ORDER — ACETAMINOPHEN 650 MG RE SUPP: 650.0000 mg | Freq: Four times a day (QID) | RECTAL | Status: DC | PRN

## 2019-08-07 MED ORDER — FENTANYL CITRATE (PF) 100 MCG/2ML IJ SOLN: 25.0000 ug | INTRAMUSCULAR | Status: DC | PRN

## 2019-08-07 MED ORDER — LORAZEPAM 2 MG/ML IJ SOLN
1.0000 mg | INTRAMUSCULAR | Status: DC | PRN
  Administered 2019-08-07: 1 mg via INTRAVENOUS
  Filled 2019-08-07: qty 1

## 2019-08-07 MED ORDER — DEXTROSE 50 % IV SOLN: 12.5000 g | INTRAVENOUS | Status: AC

## 2019-08-07 MED ORDER — LORAZEPAM 2 MG/ML PO CONC: 1.0000 mg | ORAL | Status: DC | PRN

## 2019-08-07 MED ORDER — LORAZEPAM 1 MG PO TABS: 1.0000 mg | ORAL_TABLET | ORAL | Status: DC | PRN

## 2019-08-07 NOTE — Care Management Important Message (Signed)
Important Message  Patient Details  Name: Joan Mosley MRN: [id number] Date of Birth: August 27, 1945   Medicare Important Message Given:  Yes     Shelda Altes 08/07/2019, 9:14 AM

## 2019-08-07 NOTE — Progress Notes (Signed)
ABG results given to Dr. Myna Hidalgo.  No further orders from RT at this time.

## 2019-08-07 NOTE — Progress Notes (Signed)
Initial Nutrition Assessment  DOCUMENTATION CODES:   Not applicable  INTERVENTION:   Downgrade diet to DYS 3 given poor dentition.    Feeding assistance with meals   Nepro Shake po TID, each supplement provides 425 kcal and 19 grams protein  Renal MVI daily   NUTRITION DIAGNOSIS:   Increased nutrient needs related to wound healing as evidenced by estimated needs.  GOAL:   Patient will meet greater than or equal to 90% of their needs  MONITOR:   PO intake, Supplement acceptance, Weight trends, Labs, I & O's  REASON FOR ASSESSMENT:   Consult Assessment of nutrition requirement/status  ASSESSMENT:   Patient with PMH significant for TIA, COPD, ESRD on HD, dementia, and DM. Presents this admission with sepsis due to cellulitis.   Pt in HD at time of RD visit. Spoke with son at bedside. Reports pt's appetite has slowly decreased over the last few months. States during this time she consumed softer foods given poor dentition. Meal consisted of B-waffles L-pasta or soft sandwich D- chopped meat with vegetables. States portion sizes were small ~1/2-1 cup. Does not use protein supplementation. Eats better with assistance. Discussed the importance of protein intake for preservation of lean body mass and to promote wound healing. Son to encourage supplement intake.   Weight shows to decline from 103.7 kg on 10/04/2018 to 82.5 kg this admission. Suspect dry weight loss but unable to quantify given history of ESRD. Will need to obtain NFPE at follow up to assess for malnutrition.   EDW: 80 kg Current weight: 82.5 kg (taken 7/9)  Medications: aranesp, hectorol, renvela Labs: CBG 67-108  Diet Order:   Diet Order            Diet renal with fluid restriction Fluid restriction: 1200 mL Fluid; Room service appropriate? Yes with Assist; Fluid consistency: Thin  Diet effective now                 EDUCATION NEEDS:   Not appropriate for education at this time  Skin:  Skin  Assessment: Skin Integrity Issues: Skin Integrity Issues:: Stage II, Unstageable, Other (Comment) Stage II: R buttocks Unstageable: L thigh Other: R/L thigh  Last BM:  PTA  Height:   Ht Readings from Last 1 Encounters:  07/14/19 5\' 1"  (1.549 m)    Weight:   Wt Readings from Last 1 Encounters:  08/05/19 110.7 kg    BMI:  Body mass index is 46.1 kg/m.  Estimated Nutritional Needs:   Kcal:  2100-2300 kcal  Protein:  115-130 grams  Fluid:  1000 ml + UOP   Mariana Single RD, LDN Clinical Nutrition Pager listed in [place]

## 2019-08-07 NOTE — Progress Notes (Addendum)
Dr Myna Hidalgo paged. Ordered ABG. Respiratory informed Dr Myna Hidalgo of  ABG  Results. No new orders given.  Lonia Blood, RN

## 2019-08-07 NOTE — Progress Notes (Addendum)
PROGRESS NOTE    GLORIAN MCDONELL  GHW:[id number] DOB: [birth year] DOA: 08/19/2019 PCP: Flossie Buffy, NP   Brief Narrative:   Joan Mosley a 74 y.o.femalewith medical history significant ofTIA; COPD on home5LO2; ESRD on TTS HD;dementia;morbid obesity (BMI 47); and DM presenting with AMS.Joan Mosley son reports that she came in for "all those bedsores". She was here recently, trying to get home wound care but they aren't available until August. Joan Mosley wounds are continuing to get worse. No change in Joan Mosley mental status, she is "extremely forgetful" at baseline. No fever. She does TTS HD.  7/12: Hypoxic last night. Placed on BiPap and still hypoxic this AM. Lung imaging w/o clot or PNA. Minor effusion noted, but would not think it was significant enough to cause this response. She is catatonic. Joan Mosley glascow is 4 - 5 at best. 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 Palliative Care updated that HCPOA is flying in to initiate CC measures.   UPDATE: Family is moving forward with comfort care measures. Will place ordered.   Assessment & Plan:   Principal Problem:   Sepsis due to cellulitis [facility]) Active Problems:   Hypothyroidism   ESRD on dialysis ([place])   Chronic respiratory failure with hypoxia, on home O2 therapy ([place])   Advanced care planning/counseling discussion   Type II diabetes mellitus with renal manifestations (HCC)   COPD (chronic obstructive pulmonary disease) (HCC)   Hypotension   Anemia in chronic kidney disease   Neurocognitive deficits   DNR (do not resuscitate)  Sepsis due to wound infection Acute metabolic encephalopathy - SIRS criteria in this patient includes: Leukocytosis, tachycardia, tachypnea  - Patient has evidence of acute organ failure with elevated lactate >2 that is not easily explained by another  condition. - Suspected source is wounds/purulent cellulitis - Bld Cx NTD, UCX pending - IJ established; start rocephin/vanc - lactic acid is improved; follow - 7/12: appreciate surgery eval. She became hypoxic ON. Placed on BiPap but hypoxia did not improve. CXT/CT chest/VQ scan showed only small/trace pleural effusions. Joan Mosley mentation had acutely change w/ glascow 4 - 5 at best this AM and again this PM. MRI shows multiple embolic infarcts. Updated family at bedside and on phone. HCPOA is flying down to [place] tonight. Family heavily leaning towards comfort care at this point. I have updated palliative care.  Chronic respiratory failure associated with COPD - Patient is on 5L home O2 and uses BIPAP qhs; continue O2 support here - Continue Duoneb QID and Xopenex prn - Continue Singulair - stable on 5L ; BiPap ordered for qHS.     - 7/12: required BiPap last night, this morning; see ABG, lung imaging negative. Family to make hospice decision tonight.   ESRD on HD - Patient on chronic TTS HD - continue Renvela, Aranesp, Hectorol for CKD-associated anemia     - per nephrology  Hypotension - likely sepsis related - she does take midodrine on HD days; continue - Hold Lasix - PRN fluid boluses - 7/12: BP ok this AM, follow  HLD - atorvastatin  Dementia - Per admitting physician, son says this is at/near baseline - Continue Klonopin, Prozac  Goals of care - Patient has severe COPD and ESRD at baseline - Now with dementia - Progressive weakness, wheelchair-dependent, does not leave the bed - PC consulted; HCPOA: DNR, continue current TX  DM2 w/ hypoglycemia - encourage  diet; hold remeron d/t lethargy, nutrition consult  Hypothyroidism - continue synthroid  Obesity - Body mass index is 34.99 kg/m. - She is immobile and this appears unlikely to improve  Multiple  wounds all POA - unstagable left inner/upper thigh wound - unstagable right inner upper thigh wound - unstagable right trohchanter wound     - unstagable left gluteal wound - WOC addressing, see Joan Mosley note - continue abx; will need wound care at discharge     - 7/12: appreciate surgery eval; see above  DVT prophylaxis: heparin Code Status: DNR Family Communication: Spoke with son on phone and at bedside   Status is: Inpatient  Remains inpatient appropriate because:Inpatient level of care appropriate due to severity of illness   Dispo: The patient is from: Home              Anticipated d/c is to: Home              Anticipated d/c date is: 3 days              Patient currently is not medically stable to d/c.  Consultants:   Nephrology  Palliative Care  General Surgery  Procedures:   IJ placement  Antimicrobials:  . Rocephin, vancomycin   ROS:  Unable obtain d/t mentation.  Subjective: Hypoxic ON  Objective: Vitals:   08/06/19 1643 08/06/19 2000 08/06/19 2300 08/07/19 0140  BP: (!) 108/59 123/85 99/74 100/79  Pulse: (!) 108 100 90 92  Resp: 20 20  18   Temp: 98.6 F (37 C) 97.6 F (36.4 C)    TempSrc: Axillary Axillary    SpO2:  97%  95%  Weight:        Intake/Output Summary (Last 24 hours) at 08/07/2019 0720 Last data filed at 08/06/2019 2300 Gross per 24 hour  Intake 50 ml  Output 10 ml  Net 40 ml   Filed Weights   08/03/19 2320 08/04/19 0320 08/05/19 0337  Weight: 84 kg 82.5 kg 110.7 kg    Examination:  General: 74 y.o. ill appear female resting in bed Cardiovascular: RRR, +S1, S2, no m/g/r Respiratory: shallow breathing, but clear, now on 5L [place] and appears comfortable GI: BS+, NDNT, soft MSK: No e/c/c, multiple inner thigh wounds, multiple L/R hip wounds Neuro: weakly eye opening to verbal stimuli, non responsive verbally, no motor response, no focality on exam  Data Reviewed: I have personally reviewed following labs and  imaging studies.  CBC: Recent Labs  Lab 08/10/2019 1207 07/27/2019 1207 08/11/2019 2247 08/03/19 0943 08/04/19 0500 08/05/19 0427 08/06/19 0435  WBC 17.5*  --   --  17.8* 16.8* 11.8* 13.0*  NEUTROABS 14.5*  --   --   --  13.9* 8.9* 10.0*  HGB 11.2*   < > 10.2* 9.4* 8.9* 8.4* 9.3*  HCT 36.9   < > 30.0* 30.9* 29.7* 28.5* 30.6*  MCV 95.8  --   --  96.3 93.7 96.6 97.1  PLT 235  --   --  263 219 203 203   < > = values in this interval not displayed.   Basic Metabolic Panel: Recent Labs  Lab 07/28/2019 1207 08/04/2019 1207 07/31/2019 2247 08/03/19 0943 08/04/19 0500 08/05/19 0427 08/06/19 0435  NA 136   < > 134* 135 138 138 137  K 3.0*   < > 3.4* 3.6 3.7 4.0 4.1  CL 94*  --   --  95* 100 100 100  CO2 25  --   --  24 29  27 28  GLUCOSE 113*  --   --  88 127* 92 89  BUN 18  --   --  23 7* 11 6*  CREATININE 2.85*  --   --  3.36* 1.69* 2.23* 1.76*  CALCIUM 9.4  --   --  9.1 8.5* 9.3 9.7  MG  --   --   --   --  1.7 1.8 1.7  PHOS  --   --   --   --   --  3.7  --    < > = values in this interval not displayed.   GFR: Estimated Creatinine Clearance: 31.3 mL/min (A) (by C-G formula based on SCr of 1.76 mg/dL (H)). Liver Function Tests: Recent Labs  Lab 08/11/2019 1207 08/04/19 0500 08/05/19 0427 08/06/19 0435  AST 27 20  --  17  ALT 17 15  --  17  ALKPHOS 212* 160*  --  155*  BILITOT 1.3* 0.6  --  0.9  PROT 5.4* 4.6*  --  4.7*  ALBUMIN 1.9* 1.5* 1.3* 1.4*   No results for input(s): LIPASE, AMYLASE in the last 168 hours. No results for input(s): AMMONIA in the last 168 hours. Coagulation Profile: Recent Labs  Lab 08/17/2019 1207  INR 1.0   Cardiac Enzymes: No results for input(s): CKTOTAL, CKMB, CKMBINDEX, TROPONINI in the last 168 hours. BNP (last 3 results) Recent Labs    04/10/19 1418  PROBNP 81.0   HbA1C: No results for input(s): HGBA1C in the last 72 hours. CBG: Recent Labs  Lab 08/06/19 1235 08/06/19 1645 08/06/19 1956 08/06/19 2316 08/07/19 0600  GLUCAP 70 82  80 90 71   Lipid Profile: No results for input(s): CHOL, HDL, LDLCALC, TRIG, CHOLHDL, LDLDIRECT in the last 72 hours. Thyroid Function Tests: No results for input(s): TSH, T4TOTAL, FREET4, T3FREE, THYROIDAB in the last 72 hours. Anemia Panel: No results for input(s): VITAMINB12, FOLATE, FERRITIN, TIBC, IRON, RETICCTPCT in the last 72 hours. Sepsis Labs: Recent Labs  Lab 07/28/2019 1207 08/21/2019 1557 07/28/2019 2308 08/03/19 0943 08/04/19 0500  PROCALCITON  --   --  >150.00 >150.00 97.16  LATICACIDVEN 2.2* 2.5* 3.8* 1.2  --     Recent Results (from the past 240 hour(s))  Blood Culture (routine x 2)     Status: None (Preliminary result)   Collection Time: 08/01/2019 11:41 AM   Specimen: BLOOD LEFT HAND  Result Value Ref Range Status   Specimen Description BLOOD LEFT HAND  Final   Special Requests   Final    BOTTLES DRAWN AEROBIC AND ANAEROBIC Blood Culture results may not be optimal due to an inadequate volume of blood received in culture bottles   Culture   Final    NO GROWTH 4 DAYS Performed at [facility], [place] [address] [place], [place] [address]    Report Status PENDING  Incomplete  Blood Culture (routine x 2)     Status: None (Preliminary result)   Collection Time: 08/17/2019  1:02 PM   Specimen: BLOOD LEFT HAND  Result Value Ref Range Status   Specimen Description BLOOD LEFT HAND  Final   Special Requests   Final    BOTTLES DRAWN AEROBIC AND ANAEROBIC Blood Culture adequate volume   Culture   Final    NO GROWTH 4 DAYS Performed at [facility], [place] [address] [place], [place] [address]    Report Status PENDING  Incomplete  SARS Coronavirus 2 by RT PCR (hospital order, performed in [facility]) Nasopharyngeal Nasopharyngeal Swab     Status: None   Collection Time: 08/01/2019  4:53 PM   Specimen: Nasopharyngeal Swab  Result Value Ref Range Status   SARS Coronavirus 2 NEGATIVE NEGATIVE Final    Comment: (NOTE) SARS-CoV-2 target nucleic acids are  NOT DETECTED.  The SARS-CoV-2 RNA is generally detectable in upper and lower respiratory specimens during the acute phase of infection. The lowest concentration of SARS-CoV-2 viral copies this assay can detect is 250 copies / mL. A negative result does not preclude SARS-CoV-2 infection and should not be used as the sole basis for treatment or other patient management decisions.  A negative result may occur with improper specimen collection / handling, submission of specimen other than nasopharyngeal swab, presence of viral mutation(s) within the areas targeted by this assay, and inadequate number of viral copies (<250 copies / mL). A negative result must be combined with clinical observations, patient history, and epidemiological information.  Fact Sheet for Patients:   StrictlyIdeas.no  Fact Sheet for Healthcare Providers: BankingDealers.co.za  This test is not yet approved or  cleared by the [place] FDA and has been authorized for detection and/or diagnosis of SARS-CoV-2 by FDA under an Emergency Use Authorization (EUA).  This EUA will remain in effect (meaning this test can be used) for the duration of the COVID-19 declaration under Section 564(b)(1) of the Act, 21 U.S.C. section 360bbb-3(b)(1), unless the authorization is terminated or revoked sooner.  Performed at [facility], [place] [address], [place], [place] [address]       Radiology Studies: No results found.   Scheduled Meds: . atorvastatin  20 mg Oral Daily  . Chlorhexidine Gluconate Cloth  6 each Topical Q0600  . clonazePAM  0.25 mg Oral Q T,Th,Sa-HD  . clonazePAM  0.5 mg Oral QHS  . collagenase   Topical Daily  . [START ON 08/31/2019] Darbepoetin Alfa  25 mcg Intravenous Q Tue-HD  . doxercalciferol  3 mcg Intravenous Q T,Th,Sa-HD  . FLUoxetine  10 mg Oral Daily  . heparin  5,000 Units Subcutaneous Q8H  . levothyroxine  112 mcg Oral QAC breakfast  .  midodrine  10 mg Oral Once per day on Tue Thu Sat  . mirtazapine  15 mg Oral QHS  . montelukast  10 mg Oral QHS  . pantoprazole  40 mg Oral QAC breakfast  . sevelamer carbonate  1,600 mg Oral TID WC  . sodium chloride flush  3 mL Intravenous Q12H   Continuous Infusions: . cefTRIAXone (ROCEPHIN)  IV 2 g (08/06/19 1240)  . vancomycin Stopped (08/05/19 1035)     LOS: 5 days    Time spent: 60 minutes spent in the coordination of care today.    Jonnie Finner, DO [facility]  If 7PM-7AM, please contact night-coverage [email or web address] 08/07/2019, 7:20 AM

## 2019-08-07 NOTE — Progress Notes (Signed)
Pt son, [name], at bedside.  States that he has discussed with brothers and they are prepared to move forward with full comfort care.  Dr. Marylyn Ishihara notified, orders received.  Telemetry discontinued, however, family would like to keep on at this time.  CCMD notified to change to comfort care settings.  Pt family also refuses further dressing changes at this time.

## 2019-08-07 NOTE — Progress Notes (Addendum)
Change in LOC. Patient hard to arouse. Klonopin and Remeron held. Placed on BiPap 5L; O2 sat 92-94. B/P 87/68.     Lonia Blood, RN

## 2019-08-07 NOTE — Progress Notes (Signed)
  [facility] Progress Note   Patient has now been transitioned to comfort care. Son at bedside, wishes to stop hemodialysis. Emotional support to family. Will sign off.   Juanell Fairly NP-C [employer]

## 2019-08-08 DIAGNOSIS — A419 Sepsis, unspecified organism: Secondary | ICD-10-CM

## 2019-08-08 DIAGNOSIS — L8990 Pressure ulcer of unspecified site, unspecified stage: Secondary | ICD-10-CM

## 2019-08-08 DIAGNOSIS — R6520 Severe sepsis without septic shock: Secondary | ICD-10-CM

## 2019-08-08 DIAGNOSIS — G9340 Encephalopathy, unspecified: Secondary | ICD-10-CM

## 2019-08-14 ENCOUNTER — Encounter (HOSPITAL_BASED_OUTPATIENT_CLINIC_OR_DEPARTMENT_OTHER): Payer: Medicare Other | Admitting: Internal Medicine

## 2019-08-27 NOTE — Progress Notes (Addendum)
PROGRESS NOTE    Joan Mosley  ACZ:[id number] DOB: [birth year] DOA: 08/13/2019 PCP: Flossie Buffy, NP   Brief Narrative:   Joan Mosley a 74 y.o.femalewith medical history significant ofTIA; COPD on home5LO2; ESRD on TTS HD;dementia;morbid obesity (BMI 47); and DM presenting with AMS.Her son reports that she came in for "all those bedsores". She was here recently, trying to get home wound care but they aren't available until August. Her wounds are continuing to get worse. No change in her mental status, she is "extremely forgetful" at baseline. No fever. She does TTS HD.  7/13: Family has elected hospice. Working on options. Continue comfort care measures.   Assessment & Plan:   Principal Problem:   Sepsis with acute organ dysfunction and septic shock (HCC) Active Problems:   Hypothyroidism   ESRD on dialysis ([place])   Chronic respiratory failure with hypoxia, on home O2 therapy ([place])   Advanced care planning/counseling discussion   Hospice care   Type II diabetes mellitus with renal manifestations (HCC)   COPD (chronic obstructive pulmonary disease) (HCC)   Hypotension   Anemia in chronic kidney disease   Neurocognitive deficits   DNR (do not resuscitate)  Sepsis due to wound infection Acute metabolic encephalopathy - SIRS criteria in this patient includes: Leukocytosis, tachycardia, tachypnea  - Patient has evidence of acute organ failure with elevated lactate >2 that is not easily explained by another condition. - Suspected source is wounds/purulent cellulitis - Bld Cx NTD, UCX pending - IJ established; start rocephin/vanc - lactic acid is improved; follow - appreciate surgery eval. She became hypoxic ON. Placed on BiPap but hypoxia did not improve. CXT/CT chest/VQ scan showed only small/trace pleural effusions. Her mentation had acutely change w/ glascow 4 - 5 at best this AM and again this PM. MRI shows multiple embolic  infarcts. Updated family at bedside and on phone. HCPOA has initiated comfort care at this point. I have updated palliative care.     - 7/13: Family has met with palliative care. They have confirmed hospice desire. Working on options. Of note, allergy listing shows codeine as anaphylaxis. Son reports that pt was on percocet for 15 years without an issue.   Chronic respiratory failure associated with COPD - Patient is on 5L home O2 and uses BIPAP qhs; continue O2 support here     - see above, re: comfort care/hospice  Multiple wounds all POA - unstagable left inner/upper thigh wound - unstagable right inner upper thigh wound - unstagable righttrohchanter wound - unstagable left gluteal wound     - see above, re: comfort care/hospice  ESRD on HD - Patient on chronic TTS HD - per nephrology     - HD stopped; see above, re: comfort care/hospice  Hypotension - likely sepsis related      - see above, re: comfort care/hospice  HLD     - see above, re: comfort care/hospice  Dementia     - see above, re: comfort care/hospice  Goals of care - Patient has severe COPD and ESRD at baseline - Now with dementia - Progressive weakness, wheelchair-dependent, does not leave the bed - New MRI imagining showing multiple embolic infarct     - HCPOA has initiated comfort care measures  DM2w/ hypoglycemia     - see above, re: comfort care/hospice  Hypothyroidism     - see above, re: comfort care/hospice  Obesity     - see above, re: comfort care/hospice  DVT prophylaxis: None, comfort care  Code Status: DNR, comfort care Family Communication: Spoke with sons, [name], and [name], at bedside.   Status is: Inpatient  Remains inpatient appropriate because:Unsafe d/c plan   Dispo: The patient is from: Home              Anticipated d/c is to: Hospice (residential)              Anticipated d/c date is: 1 day              Patient  currently is medically stable to d/c.  ROS:  Unable to obtain d/t mentation  Subjective: No acute events ON.   Objective: Vitals:   08/07/19 0727 08/07/19 0917 08/07/19 1220 08/07/19 1242  BP: 110/87  123/86   Pulse: 92 91 85   Resp: 20 (!) 22 20   Temp: (!) 97.2 F (36.2 C)   (!) 95.5 F (35.3 C)  TempSrc: Rectal   Rectal  SpO2: 100% 97% 100% 99%  Weight:       No intake or output data in the 24 hours ending 08/17/19 0756 Filed Weights   08/03/19 2320 08/04/19 0320 08/05/19 0337  Weight: 84 kg 82.5 kg 110.7 kg    Examination:  General: 74 y.o. ill appearing female resting in bed in NAD Cardiovascular: RRR, +S1, S2, no m/g/r Respiratory: Clear but shallow GI: BS+, NDNT, soft Neuro: not responsive to verbal command, somnolent   Data Reviewed: I have personally reviewed following labs and imaging studies.  CBC: Recent Labs  Lab 08/23/2019 1207 08/09/2019 2247 08/03/19 0943 08/04/19 0500 08/05/19 0427 08/06/19 0435 08/07/19 0919  WBC 17.5*  --  17.8* 16.8* 11.8* 13.0* 15.5*  NEUTROABS 14.5*  --   --  13.9* 8.9* 10.0* 12.3*  HGB 11.2*   < > 9.4* 8.9* 8.4* 9.3* 9.1*  HCT 36.9   < > 30.9* 29.7* 28.5* 30.6* 31.5*  MCV 95.8  --  96.3 93.7 96.6 97.1 96.9  PLT 235  --  263 219 203 203 181   < > = values in this interval not displayed.   Basic Metabolic Panel: Recent Labs  Lab 08/03/19 0943 08/04/19 0500 08/05/19 0427 08/06/19 0435 08/07/19 0919  NA 135 138 138 137 138  K 3.6 3.7 4.0 4.1 4.2  CL 95* 100 100 100 101  CO2 _0 GLUCOSE 88 127* 92 89 86  BUN 23 7* 11 6* 14  CREATININE 3.36* 1.69* 2.23* 1.76* 2.63*  CALCIUM 9.1 8.5* 9.3 9.7 10.2  MG  --  1.7 1.8 1.7 1.8  PHOS  --   --  3.7  --  4.1   GFR: Estimated Creatinine Clearance: 21 mL/min (A) (by C-G formula based on SCr of 2.63 mg/dL (H)). Liver Function Tests: Recent Labs  Lab 08/13/2019 1207 08/04/19 0500 08/05/19 0427 08/06/19 0435 08/07/19 0919  AST 27 20  --  17  --   ALT 17 15   --  17  --   ALKPHOS 212* 160*  --  155*  --   BILITOT 1.3* 0.6  --  0.9  --   PROT 5.4* 4.6*  --  4.7*  --   ALBUMIN 1.9* 1.5* 1.3* 1.4* 1.4*   No results for input(s): LIPASE, AMYLASE in the last 168 hours. No results for input(s): AMMONIA in the last 168 hours. Coagulation Profile: Recent Labs  Lab 08/26/2019 1207  INR 1.0   Cardiac Enzymes: No results for input(s): CKTOTAL, CKMB, CKMBINDEX, TROPONINI in  the last 168 hours. BNP (last 3 results) Recent Labs    04/10/19 1418  PROBNP 81.0   HbA1C: No results for input(s): HGBA1C in the last 72 hours. CBG: Recent Labs  Lab 08/07/19 0600 08/07/19 0903 08/07/19 1230 08/07/19 1232 08/07/19 1347  GLUCAP 71 78 69* 67* 108*   Lipid Profile: No results for input(s): CHOL, HDL, LDLCALC, TRIG, CHOLHDL, LDLDIRECT in the last 72 hours. Thyroid Function Tests: No results for input(s): TSH, T4TOTAL, FREET4, T3FREE, THYROIDAB in the last 72 hours. Anemia Panel: No results for input(s): VITAMINB12, FOLATE, FERRITIN, TIBC, IRON, RETICCTPCT in the last 72 hours. Sepsis Labs: Recent Labs  Lab 08/24/2019 1207 08/25/2019 1557 08/18/2019 2308 08/03/19 0943 08/04/19 0500  PROCALCITON  --   --  >150.00 >150.00 97.16  LATICACIDVEN 2.2* 2.5* 3.8* 1.2  --     Recent Results (from the past 240 hour(s))  Blood Culture (routine x 2)     Status: None   Collection Time: 08/25/2019 11:41 AM   Specimen: BLOOD LEFT HAND  Result Value Ref Range Status   Specimen Description BLOOD LEFT HAND  Final   Special Requests   Final    BOTTLES DRAWN AEROBIC AND ANAEROBIC Blood Culture results may not be optimal due to an inadequate volume of blood received in culture bottles   Culture   Final    NO GROWTH 5 DAYS Performed at [facility], [place] [address] [place], [place] [address]    Report Status 08/07/2019 FINAL  Final  Blood Culture (routine x 2)     Status: None   Collection Time: 08/04/2019  1:02 PM   Specimen: BLOOD LEFT HAND  Result Value Ref  Range Status   Specimen Description BLOOD LEFT HAND  Final   Special Requests   Final    BOTTLES DRAWN AEROBIC AND ANAEROBIC Blood Culture adequate volume   Culture   Final    NO GROWTH 5 DAYS Performed at [facility], [place] [address], [place], [place] [address]    Report Status 08/07/2019 FINAL  Final  SARS Coronavirus 2 by RT PCR (hospital order, performed in [facility] hospital lab) Nasopharyngeal Nasopharyngeal Swab     Status: None   Collection Time: 07/30/2019  4:53 PM   Specimen: Nasopharyngeal Swab  Result Value Ref Range Status   SARS Coronavirus 2 NEGATIVE NEGATIVE Final    Comment: (NOTE) SARS-CoV-2 target nucleic acids are NOT DETECTED.  The SARS-CoV-2 RNA is generally detectable in upper and lower respiratory specimens during the acute phase of infection. The lowest concentration of SARS-CoV-2 viral copies this assay can detect is 250 copies / mL. A negative result does not preclude SARS-CoV-2 infection and should not be used as the sole basis for treatment or other patient management decisions.  A negative result may occur with improper specimen collection / handling, submission of specimen other than nasopharyngeal swab, presence of viral mutation(s) within the areas targeted by this assay, and inadequate number of viral copies (<250 copies / mL). A negative result must be combined with clinical observations, patient history, and epidemiological information.  Fact Sheet for Patients:   StrictlyIdeas.no  Fact Sheet for Healthcare Providers: BankingDealers.co.za  This test is not yet approved or  cleared by the [place] FDA and has been authorized for detection and/or diagnosis of SARS-CoV-2 by FDA under an Emergency Use Authorization (EUA).  This EUA will remain in effect (meaning this test can be used) for the duration of the COVID-19 declaration  under Section 564(b)(1) of the Act, 21 U.S.C. section  360bbb-3(b)(1), unless the authorization is terminated or revoked sooner.  Performed at [facility], [place] [address], [place], [place] [address]       Radiology Studies: CT HEAD WO CONTRAST  Result Date: 08/07/2019 CLINICAL DATA:  Encephalopathy. EXAM: CT HEAD WITHOUT CONTRAST TECHNIQUE: Contiguous axial images were obtained from the base of the skull through the vertex without intravenous contrast. COMPARISON:  05/31/2019 FINDINGS: The study is moderately motion degraded despite repeat imaging. Brain: Within limitations of motion artifact, no acute large territory infarct, acute intracranial hemorrhage, or midline shift is identified. Hypodensities in the cerebral white matter bilaterally are similar to the prior study and nonspecific but compatible with moderate chronic small vessel ischemic disease. Previously described possible tiny residual chronic subdural hematomas over the cerebral convexities are poorly evaluated due to motion artifact. Cerebral atrophy is unchanged. Vascular: Calcified atherosclerosis at the skull base. Skull: No gross fracture or suspicious osseous lesion. Sinuses/Orbits: Persistent bilateral mastoid effusions. New right middle ear effusion. Orbits largely obscured by motion artifact. Other: None. IMPRESSION: 1. Motion degraded examination without evidence of an acute intracranial abnormality. 2. Moderate chronic small vessel ischemic disease. 3. Persistent bilateral mastoid effusions. New right middle ear effusion. Electronically Signed   By: Logan Bores M.D.   On: 08/07/2019 11:11   CT CHEST WO CONTRAST  Result Date: 08/07/2019 CLINICAL DATA:  Hypoxia. EXAM: CT CHEST WITHOUT CONTRAST TECHNIQUE: Multidetector CT imaging of the chest was performed following the standard protocol without IV contrast. COMPARISON:  Chest x-ray from same day. CT chest dated February 18, 2019. FINDINGS: Despite efforts by the technologist and patient, motion artifact is present on today's  exam and could not be eliminated. This reduces exam sensitivity and specificity. Cardiovascular: Normal heart size. Unchanged severe pulmonary artery dilatation. No thoracic aortic aneurysm. Coronary, aortic arch, and branch vessel atherosclerotic vascular disease. Unchanged tunneled right internal jugular dialysis catheter with tip in the proximal right atrium. New left internal jugular central venous catheter with tip in the proximal right atrium. Mediastinum/Nodes: No enlarged mediastinal or axillary lymph nodes. Thyroid gland, trachea, and esophagus demonstrate no significant findings. Lungs/Pleura: Mild-to-moderate centrilobular emphysema again noted. Unchanged small right and new trace left pleural effusions. Subsegmental atelectasis in the right upper, middle, and lower lobes. No consolidation pneumothorax. Upper Abdomen: No acute abnormality.  Pneumobilia. Musculoskeletal: No chest wall mass or suspicious bone lesions identified. Old bilateral rib fractures again noted. Unchanged elevation of the right hemidiaphragm. IMPRESSION: 1. Unchanged small right and new trace left pleural effusions. Right lung atelectasis, improved when compared to prior study. 2. Unchanged severe pulmonary artery dilatation, consistent with pulmonary arterial hypertension. 3. Aortic Atherosclerosis (ICD10-I70.0) and Emphysema (ICD10-J43.9). Electronically Signed   By: Titus Dubin M.D.   On: 08/07/2019 11:17   MR BRAIN WO CONTRAST  Result Date: 08/07/2019 CLINICAL DATA:  Altered mental status.  Encephalopathy. EXAM: MRI HEAD WITHOUT CONTRAST TECHNIQUE: Multiplanar, multiecho pulse sequences of the brain and surrounding structures were obtained without intravenous contrast. COMPARISON:  Head CT same day.  MRI 05/04/2019. FINDINGS: Brain: Diffusion imaging shows multiple foci punctate acute infarctions scattered within both cerebral hemispheres consistent with micro embolic infarctions. These are notable in the right occipital  lobe and both frontoparietal cortical and deep white matter regions. No large vessel territory infarction. Chronic small-vessel ischemic changes are seen throughout the pons. Old small vessel infarctions affect the cerebellum. Cerebral hemispheres show atrophy with chronic small-vessel ischemic changes throughout the  white matter. No mass, acute hemorrhage, hydrocephalus or extra-axial collection. Few scattered punctate foci of hemosiderin deposition associated with some of the old white matter infarctions. Vascular: Major vessels at the base of the brain show flow. Skull and upper cervical spine: Negative Sinuses/Orbits: Sinuses are clear. Orbits negative. Bilateral mastoid effusions. Other: None IMPRESSION: New demonstration of multiple acute punctate foci of restricted diffusion in both cerebral hemispheres consistent with micro embolic disease from the heart or ascending aorta. Background pattern of atrophy and extensive chronic small-vessel ischemic change elsewhere throughout the brain as seen previously. Electronically Signed   By: Nelson Chimes M.D.   On: 08/07/2019 14:46   NM Pulmonary Perfusion  Result Date: 08/07/2019 CLINICAL DATA:  COPD with home oxygen use. Question pulmonary emboli. EXAM: NUCLEAR MEDICINE PERFUSION LUNG SCAN TECHNIQUE: Perfusion images were obtained in multiple projections after intravenous injection of radiopharmaceutical. Ventilation scans intentionally deferred if perfusion scan and chest x-ray adequate for interpretation during COVID 19 epidemic. RADIOPHARMACEUTICALS:  4.0 mCi Tc-25mMAA IV COMPARISON:  Chest CT same day FINDINGS: No evidence of segmental or subsegmental defects to suggest pulmonary emboli. Fissural defects right more than left consistent with the known effusions. IMPRESSION: No segmental or subsegmental defects to suggest pulmonary emboli. Fissural defects, right more than left, consistent with the known effusions. Electronically Signed   By: MNelson ChimesM.D.    On: 08/07/2019 11:45   DG CHEST PORT 1 VIEW  Result Date: 08/07/2019 CLINICAL DATA:  Hypoxia EXAM: PORTABLE CHEST 1 VIEW COMPARISON:  08/14/2019 FINDINGS: Interval placement of left internal jugular approach central venous catheter with distal tip terminating at the superior cavoatrial junction. Right IJ approach hemodialysis catheter is unchanged in positioning. Stable cardiomediastinal contours. Atherosclerotic calcification of the aortic knob. Persistent small right pleural effusion. Chronic coarsened interstitial markings within the bilateral lung bases, left greater than right. No new focal airspace consolidation. No pneumothorax. IMPRESSION: 1. Persistent small right pleural effusion. 2. Interval placement of left internal jugular approach central venous catheter with distal tip terminating at the superior cavoatrial junction. No pneumothorax. Electronically Signed   By: NDavina PokeD.O.   On: 08/07/2019 08:23     Scheduled Meds: . collagenase   Topical Daily  . feeding supplement (NEPRO CARB STEADY)  237 mL Oral TID BM   Continuous Infusions:   LOS: 6 days    Time spent: 15 minutes spent in the coordination of care today.    TJonnie Finner DO [facility]  If 7PM-7AM, please contact night-coverage [email or web address] 72021/[id number] 7:56 AM

## 2019-08-27 NOTE — Death Summary Note (Signed)
Death Summary  Joan Mosley WJX:[id number] DOB: 01-Dec-1945 DOA: 08/13/19  PCP: Flossie Buffy, NP  Admit date: 08/13/19 Date of Death: 2019/08/19 Time of Death: 17:55 Notification: Flossie Buffy, NP notified of death of Aug 19, 2019   History of present illness:  Joan Mosley is a 74 y.o. female with a history of COPD, chronic hypoxic respiratory failure, type 2 diabetes mellitus, end-stage renal disease, hypothyroidism, and multiple pressure wounds who was brought in to the ED on 08/13/19 for evaluation of worsening wounds.  She is found to be febrile with leukocytosis, tachycardia, and tachypnea, and was admitted for sepsis secondary to infected wounds.  She was cultured and started on broad-spectrum antibiotics.  Patient was seen by nephrology and dialyzed during the admission.  She was continued on her usual 5 L/min of supplemental oxygen and BiPAP at night.  Her dementia was close to baseline per family.  Course was complicated by hypotension for which the patient was continued on midodrine, had to have her Lasix held, and required some fluid boluses.  With regard to her sepsis, cultures remain negative.  Lactic acid improved and she defervesced but leukocytosis persisted.  Palliative care was involved early in the admission, saw the patient on 08/03/2019 and met with patient's family.  The patient's healthcare power of attorney, her son Joan Mosley, indicated it time of admission that the patient would not want to be resuscitated during a code scenario but wanted to continue treatment of the patient's acute and chronic conditions including continuing dialysis.  Patient developed acute on chronic hypoxia, did not have any evidence of pneumonia or embolism on imaging.  Overall condition continued to worsen and she had a significant decline in her mental status.  This prompted MRI brain that demonstrated multiple acute punctate foci of restricted diffusion consistent with  microembolic disease from the heart or ascending aorta.  In light of her progressive and significant worsening, on top of her severe chronic conditions, the patient's family decided that it would be in the patient's best interest to transition to comfort measures only on 08/07/2019.  These wishes were respected, patient was kept comfortable, and allowed to pass with dignity on 08/19/19.   Final Diagnoses:  1.   Sepsis secondary to infected pressure wounds, organism unknown  2.   ESRD  3.   COPD  4.   Chronic hypoxic respiratory failure  5.   Type II DM  6.   Dementia    The results of significant diagnostics from this hospitalization (including imaging, microbiology, ancillary and laboratory) are listed below for reference.    Significant Diagnostic Studies: CT HEAD WO CONTRAST  Result Date: 08/07/2019 CLINICAL DATA:  Encephalopathy. EXAM: CT HEAD WITHOUT CONTRAST TECHNIQUE: Contiguous axial images were obtained from the base of the skull through the vertex without intravenous contrast. COMPARISON:  05/31/2019 FINDINGS: The study is moderately motion degraded despite repeat imaging. Brain: Within limitations of motion artifact, no acute large territory infarct, acute intracranial hemorrhage, or midline shift is identified. Hypodensities in the cerebral white matter bilaterally are similar to the prior study and nonspecific but compatible with moderate chronic small vessel ischemic disease. Previously described possible tiny residual chronic subdural hematomas over the cerebral convexities are poorly evaluated due to motion artifact. Cerebral atrophy is unchanged. Vascular: Calcified atherosclerosis at the skull base. Skull: No gross fracture or suspicious osseous lesion. Sinuses/Orbits: Persistent bilateral mastoid effusions. New right middle ear effusion. Orbits largely obscured by motion artifact. Other: None. IMPRESSION: 1. Motion degraded  examination without evidence of an acute intracranial  abnormality. 2. Moderate chronic small vessel ischemic disease. 3. Persistent bilateral mastoid effusions. New right middle ear effusion. Electronically Signed   By: Logan Bores M.D.   On: 08/07/2019 11:11   CT CHEST WO CONTRAST  Result Date: 08/07/2019 CLINICAL DATA:  Hypoxia. EXAM: CT CHEST WITHOUT CONTRAST TECHNIQUE: Multidetector CT imaging of the chest was performed following the standard protocol without IV contrast. COMPARISON:  Chest x-ray from same day. CT chest dated February 18, 2019. FINDINGS: Despite efforts by the technologist and patient, motion artifact is present on today's exam and could not be eliminated. This reduces exam sensitivity and specificity. Cardiovascular: Normal heart size. Unchanged severe pulmonary artery dilatation. No thoracic aortic aneurysm. Coronary, aortic arch, and branch vessel atherosclerotic vascular disease. Unchanged tunneled right internal jugular dialysis catheter with tip in the proximal right atrium. New left internal jugular central venous catheter with tip in the proximal right atrium. Mediastinum/Nodes: No enlarged mediastinal or axillary lymph nodes. Thyroid gland, trachea, and esophagus demonstrate no significant findings. Lungs/Pleura: Mild-to-moderate centrilobular emphysema again noted. Unchanged small right and new trace left pleural effusions. Subsegmental atelectasis in the right upper, middle, and lower lobes. No consolidation pneumothorax. Upper Abdomen: No acute abnormality.  Pneumobilia. Musculoskeletal: No chest wall mass or suspicious bone lesions identified. Old bilateral rib fractures again noted. Unchanged elevation of the right hemidiaphragm. IMPRESSION: 1. Unchanged small right and new trace left pleural effusions. Right lung atelectasis, improved when compared to prior study. 2. Unchanged severe pulmonary artery dilatation, consistent with pulmonary arterial hypertension. 3. Aortic Atherosclerosis (ICD10-I70.0) and Emphysema (ICD10-J43.9).  Electronically Signed   By: Titus Dubin M.D.   On: 08/07/2019 11:17   MR BRAIN WO CONTRAST  Result Date: 08/07/2019 CLINICAL DATA:  Altered mental status.  Encephalopathy. EXAM: MRI HEAD WITHOUT CONTRAST TECHNIQUE: Multiplanar, multiecho pulse sequences of the brain and surrounding structures were obtained without intravenous contrast. COMPARISON:  Head CT same day.  MRI 05/04/2019. FINDINGS: Brain: Diffusion imaging shows multiple foci punctate acute infarctions scattered within both cerebral hemispheres consistent with micro embolic infarctions. These are notable in the right occipital lobe and both frontoparietal cortical and deep white matter regions. No large vessel territory infarction. Chronic small-vessel ischemic changes are seen throughout the pons. Old small vessel infarctions affect the cerebellum. Cerebral hemispheres show atrophy with chronic small-vessel ischemic changes throughout the white matter. No mass, acute hemorrhage, hydrocephalus or extra-axial collection. Few scattered punctate foci of hemosiderin deposition associated with some of the old white matter infarctions. Vascular: Major vessels at the base of the brain show flow. Skull and upper cervical spine: Negative Sinuses/Orbits: Sinuses are clear. Orbits negative. Bilateral mastoid effusions. Other: None IMPRESSION: New demonstration of multiple acute punctate foci of restricted diffusion in both cerebral hemispheres consistent with micro embolic disease from the heart or ascending aorta. Background pattern of atrophy and extensive chronic small-vessel ischemic change elsewhere throughout the brain as seen previously. Electronically Signed   By: Nelson Chimes M.D.   On: 08/07/2019 14:46   NM Pulmonary Perfusion  Result Date: 08/07/2019 CLINICAL DATA:  COPD with home oxygen use. Question pulmonary emboli. EXAM: NUCLEAR MEDICINE PERFUSION LUNG SCAN TECHNIQUE: Perfusion images were obtained in multiple projections after intravenous  injection of radiopharmaceutical. Ventilation scans intentionally deferred if perfusion scan and chest x-ray adequate for interpretation during COVID 19 epidemic. RADIOPHARMACEUTICALS:  4.0 mCi Tc-41mMAA IV COMPARISON:  Chest CT same day FINDINGS: No evidence of segmental or subsegmental defects to suggest pulmonary  emboli. Fissural defects right more than left consistent with the known effusions. IMPRESSION: No segmental or subsegmental defects to suggest pulmonary emboli. Fissural defects, right more than left, consistent with the known effusions. Electronically Signed   By: Nelson Chimes M.D.   On: 08/07/2019 11:45   IR Fluoro Guide CV Line Right  Result Date: 08/03/2019 INDICATION: 74 year old with history of sepsis and poor venous access. Request for central line placement. EXAM: CENTRAL LINE PLACEMENT WITH FLUOROSCOPIC AND ULTRASOUND GUIDANCE COMPARISON:  None. MEDICATIONS: None ANESTHESIA/SEDATION: None FLUOROSCOPY TIME:  6 seconds, 1 mGy COMPLICATIONS: None immediate. PROCEDURE: Informed consent was obtained for central line placement. Patient has a right chest dialysis catheter. Attention was directed to the left side of the chest. Ultrasound confirmed a patent left internal jugular vein. Ultrasound image was saved for documentation. Left side of the neck was prepped and draped in sterile fashion. Maximal barrier sterile technique was utilized including caps, mask, sterile gowns, sterile gloves, sterile drape, hand hygiene and skin antiseptic. Skin was anesthetized with 1% lidocaine. Small incision was made. Using ultrasound guidance, 21 gauge needle was directed into the left internal jugular vein and wire was advanced centrally. Peel-away sheath was placed. A dual lumen power PICC line was cut to 25 cm. The catheter was advanced through the peel-away sheath. Catheter tip was placed at the SVC and right atrium junction. Both lumens aspirated and flushed well. Both lumens were flushed with saline.  Catheter was sutured to skin. Dressing was placed. Fluoroscopic and ultrasound images were taken and saved for documentation. FINDINGS: Left jugular central line tip at the SVC and right atrium junction. IMPRESSION: Successful placement of a left jugular central venous catheter. Electronically Signed   By: Markus Daft M.D.   On: 08/03/2019 17:58   IR US Guide Vasc Access Right  Result Date: 08/03/2019 INDICATION: 74 year old with history of sepsis and poor venous access. Request for central line placement. EXAM: CENTRAL LINE PLACEMENT WITH FLUOROSCOPIC AND ULTRASOUND GUIDANCE COMPARISON:  None. MEDICATIONS: None ANESTHESIA/SEDATION: None FLUOROSCOPY TIME:  6 seconds, 1 mGy COMPLICATIONS: None immediate. PROCEDURE: Informed consent was obtained for central line placement. Patient has a right chest dialysis catheter. Attention was directed to the left side of the chest. Ultrasound confirmed a patent left internal jugular vein. Ultrasound image was saved for documentation. Left side of the neck was prepped and draped in sterile fashion. Maximal barrier sterile technique was utilized including caps, mask, sterile gowns, sterile gloves, sterile drape, hand hygiene and skin antiseptic. Skin was anesthetized with 1% lidocaine. Small incision was made. Using ultrasound guidance, 21 gauge needle was directed into the left internal jugular vein and wire was advanced centrally. Peel-away sheath was placed. A dual lumen power PICC line was cut to 25 cm. The catheter was advanced through the peel-away sheath. Catheter tip was placed at the SVC and right atrium junction. Both lumens aspirated and flushed well. Both lumens were flushed with saline. Catheter was sutured to skin. Dressing was placed. Fluoroscopic and ultrasound images were taken and saved for documentation. FINDINGS: Left jugular central line tip at the SVC and right atrium junction. IMPRESSION: Successful placement of a left jugular central venous catheter.  Electronically Signed   By: Markus Daft M.D.   On: 08/03/2019 17:58   DG CHEST PORT 1 VIEW  Result Date: 08/07/2019 CLINICAL DATA:  Hypoxia EXAM: PORTABLE CHEST 1 VIEW COMPARISON:  08/10/2019 FINDINGS: Interval placement of left internal jugular approach central venous catheter with distal tip terminating at the superior  cavoatrial junction. Right IJ approach hemodialysis catheter is unchanged in positioning. Stable cardiomediastinal contours. Atherosclerotic calcification of the aortic knob. Persistent small right pleural effusion. Chronic coarsened interstitial markings within the bilateral lung bases, left greater than right. No new focal airspace consolidation. No pneumothorax. IMPRESSION: 1. Persistent small right pleural effusion. 2. Interval placement of left internal jugular approach central venous catheter with distal tip terminating at the superior cavoatrial junction. No pneumothorax. Electronically Signed   By: Davina Poke D.O.   On: 08/07/2019 08:23   DG Chest Port 1 View  Result Date: 08/04/2019 CLINICAL DATA:  Sepsis, altered mental status, history COPD, diabetes mellitus EXAM: PORTABLE CHEST 1 VIEW COMPARISON:  Portable exam 1145 hours compared to 06/29/2019 FINDINGS: RIGHT jugular line tip projecting over RIGHT atrium. Rotated to the RIGHT. Normal heart size, mediastinal contours, and pulmonary vascularity. Atherosclerotic calcification aorta. Bronchitic changes with bibasilar atelectasis and tiny RIGHT pleural effusion. Upper lungs clear. No pneumothorax or acute osseous findings. IMPRESSION: Bronchitic changes with bibasilar atelectasis and tiny RIGHT pleural effusion. Aortic Atherosclerosis (ICD10-I70.0). Electronically Signed   By: Lavonia [name] M.D.   On: 08/16/2019 12:14   DG HIP UNILAT WITH PELVIS 2-3 VIEWS LEFT  Result Date: 08/03/2019 CLINICAL DATA:  Left hip pain. EXAM: DG HIP (WITH OR WITHOUT PELVIS) 2-3V LEFT COMPARISON:  CT 06/01/2019. FINDINGS: Air-filled loops of small  large bowel noted. Adynamic ileus cannot be excluded. Diffuse osteopenia and degenerative change. No acute bony or joint abnormality. No evidence of fracture or dislocation. Peripheral vascular calcification. IMPRESSION: 1. Diffuse osteopenia degenerative change. No acute bony abnormality. 2.  Mild adynamic ileus cannot be excluded. 3.  Peripheral vascular disease. Electronically Signed   By: Marcello Moores  Register   On: 08/03/2019 08:05    Microbiology: Recent Results (from the past 240 hour(s))  Blood Culture (routine x 2)     Status: None   Collection Time: 08/11/2019 11:41 AM   Specimen: BLOOD LEFT HAND  Result Value Ref Range Status   Specimen Description BLOOD LEFT HAND  Final   Special Requests   Final    BOTTLES DRAWN AEROBIC AND ANAEROBIC Blood Culture results may not be optimal due to an inadequate volume of blood received in culture bottles   Culture   Final    NO GROWTH 5 DAYS Performed at [facility], [place] [address] [place], [place] [address]    Report Status 08/07/2019 FINAL  Final  Blood Culture (routine x 2)     Status: None   Collection Time: 08/23/2019  1:02 PM   Specimen: BLOOD LEFT HAND  Result Value Ref Range Status   Specimen Description BLOOD LEFT HAND  Final   Special Requests   Final    BOTTLES DRAWN AEROBIC AND ANAEROBIC Blood Culture adequate volume   Culture   Final    NO GROWTH 5 DAYS Performed at [facility], [place] [address], [place], [place] [address]    Report Status 08/07/2019 FINAL  Final  SARS Coronavirus 2 by RT PCR (hospital order, performed in [facility] hospital lab) Nasopharyngeal Nasopharyngeal Swab     Status: None   Collection Time: 07/28/2019  4:53 PM   Specimen: Nasopharyngeal Swab  Result Value Ref Range Status   SARS Coronavirus 2 NEGATIVE NEGATIVE Final    Comment: (NOTE) SARS-CoV-2 target nucleic acids are NOT DETECTED.  The SARS-CoV-2 RNA is generally detectable in upper and lower respiratory specimens during the acute phase  of infection. The lowest concentration of SARS-CoV-2  viral copies this assay can detect is 250 copies / mL. A negative result does not preclude SARS-CoV-2 infection and should not be used as the sole basis for treatment or other patient management decisions.  A negative result may occur with improper specimen collection / handling, submission of specimen other than nasopharyngeal swab, presence of viral mutation(s) within the areas targeted by this assay, and inadequate number of viral copies (<250 copies / mL). A negative result must be combined with clinical observations, patient history, and epidemiological information.  Fact Sheet for Patients:   StrictlyIdeas.no  Fact Sheet for Healthcare Providers: BankingDealers.co.za  This test is not yet approved or  cleared by the [place] FDA and has been authorized for detection and/or diagnosis of SARS-CoV-2 by FDA under an Emergency Use Authorization (EUA).  This EUA will remain in effect (meaning this test can be used) for the duration of the COVID-19 declaration under Section 564(b)(1) of the Act, 21 U.S.C. section 360bbb-3(b)(1), unless the authorization is terminated or revoked sooner.  Performed at [facility], [place] [address] [place], [place] [address]      Labs: Basic Metabolic Panel: Recent Labs  Lab 08/03/19 [phone] 08/03/19 0943 08/04/19 0500 08/04/19 0500 08/05/19 0427 08/05/19 0427 08/06/19 0435 08/07/19 0919  NA 135  --  138  --  138  --  137 138  K 3.6   < > 3.7   < > 4.0   < > 4.1 4.2  CL 95*  --  100  --  100  --  100 101  CO2 24  --  29  --  27  --  28 27  GLUCOSE 88  --  127*  --  92  --  89 86  BUN 23  --  7*  --  11  --  6* 14  CREATININE 3.36*  --  1.69*  --  2.23*  --  1.76* 2.63*  CALCIUM 9.1  --  8.5*  --  9.3  --  9.7 10.2  MG  --   --  1.7  --  1.8  --  1.7 1.8  PHOS  --   --   --   --  3.7  --   --  4.1   < > = values in this interval  not displayed.   Liver Function Tests: Recent Labs  Lab 08/01/2019 1207 08/04/19 0500 08/05/19 0427 08/06/19 0435 08/07/19 0919  AST 27 20  --  17  --   ALT 17 15  --  17  --   ALKPHOS 212* 160*  --  155*  --   BILITOT 1.3* 0.6  --  0.9  --   PROT 5.4* 4.6*  --  4.7*  --   ALBUMIN 1.9* 1.5* 1.3* 1.4* 1.4*   No results for input(s): LIPASE, AMYLASE in the last 168 hours. No results for input(s): AMMONIA in the last 168 hours. CBC: Recent Labs  Lab 08/21/2019 1207 08/18/2019 2247 08/03/19 0943 08/04/19 0500 08/05/19 0427 08/06/19 0435 08/07/19 0919  WBC 17.5*  --  17.8* 16.8* 11.8* 13.0* 15.5*  NEUTROABS 14.5*  --   --  13.9* 8.9* 10.0* 12.3*  HGB 11.2*   < > 9.4* 8.9* 8.4* 9.3* 9.1*  HCT 36.9   < > 30.9* 29.7* 28.5* 30.6* 31.5*  MCV 95.8  --  96.3 93.7 96.6 97.1 96.9  PLT 235  --  263 219 203 203 181   < > = values  in this interval not displayed.   Cardiac Enzymes: No results for input(s): CKTOTAL, CKMB, CKMBINDEX, TROPONINI in the last 168 hours. D-Dimer No results for input(s): DDIMER in the last 72 hours. BNP: Invalid input(s): POCBNP CBG: Recent Labs  Lab 08/07/19 0600 08/07/19 0903 08/07/19 1230 08/07/19 1232 08/07/19 1347  GLUCAP 71 78 69* 67* 108*   Anemia work up No results for input(s): VITAMINB12, FOLATE, FERRITIN, TIBC, IRON, RETICCTPCT in the last 72 hours. Urinalysis    Component Value Date/Time   COLORURINE YELLOW 06/27/2019 1406   APPEARANCEUR TURBID (A) 06/27/2019 1406   LABSPEC 1.014 06/27/2019 1406   PHURINE 5.0 06/27/2019 1406   GLUCOSEU NEGATIVE 06/27/2019 1406   HGBUR MODERATE (A) 06/27/2019 1406   BILIRUBINUR NEGATIVE 06/27/2019 1406   KETONESUR NEGATIVE 06/27/2019 1406   PROTEINUR 100 (A) 06/27/2019 1406   NITRITE NEGATIVE 06/27/2019 1406   LEUKOCYTESUR SMALL (A) 06/27/2019 1406   Sepsis Labs Invalid input(s): PROCALCITONIN,  WBC,  LACTICIDVEN     SIGNED:  Vianne Bulls, MD  [facility] Aug 13, 2019, 8:41 PM Pager     If 7PM-7AM, please contact night-coverage [email or web address] Password [email or web address]

## 2019-08-27 NOTE — Progress Notes (Signed)
Patient ID: TEHANI MERSMAN, female   DOB: Apr 25, 1945, 74 y.o.   MRN: [id number]  This NP visited patient at the bedside as a follow up for palliative medicine needs and emotional support and at the request of attending/Dr. Marylyn Ishihara.  Patient had significant medical/decline events over the past 24 hours; hypoxia, MRI significant for new multiple acute stroke.  In discussion with attending family has made decision to shift to comfort focusing, allowing for a natural death.  I spoke at bedside with patient's H POA/son/Ryan Hassell Done for continued conversation regarding current medical situation.  Ryan verbalizes his understanding that the patient is at end-of-life and prognosis is likely hours to days. I also spoke to son [name] and his significant other [name], they too understand the prognosis is limited.  All questions and concerns were addressed.  Education to all regarding natural trajectory and expectations at end of life.  Plan of care -DNR/DNI -No artificial feeding or hydration now or in the future -No further dialysis -No diagnostics -Limit wound dressing changes,  only for comfort -Symptom management  -Prognosis is likely hours to days, anticipate hospital death.  Questions and concerns addressed   Discussed with Dr Marylyn Ishihara  Total time spent on the unit was 35 minutes  PMT will continue to support holistically   Greater than 50% of the time was spent in counseling and coordination of care  Wadie Lessen NP  Palliative Medicine Team Team Phone # [phone] Pager [phone]

## 2019-08-27 NOTE — Progress Notes (Signed)
   2019-08-14 0914  Clinical Encounter Type  Visited With Patient and family together  Visit Type Initial;Spiritual support  Referral From Palliative care team;Physician  Consult/Referral To Chaplain  Spiritual Encounters  Spiritual Needs Prayer  This chaplain responded to MD consult for EOL spiritual care.  The chaplain appreciates the updates from RN-Angela and NT-Theresa. The chaplain understands the RN and NT will page the chaplain as family arrives today.  The chaplain started rapport building with the Pt. son-Eric. The chaplain affirmed Eric's presence bedside. The chaplain shared prayer with [name].  The chaplain understands pastoral presence may be different with other family.  The chaplain is available for Pt. and family F/U spiritual care as needed.

## 2019-08-27 NOTE — Progress Notes (Signed)
Chaplain responded to a page re. family requesting prayer at the death of their loved one.  Upon arrival Chaplain was greeted by patient's 3 sons, her daughter, a grandson and an "adopted" son at her bedside.  Chaplain established relationship with family by asking about their mother.  Learning their mother was Catholic, Clinical biochemist offered at bedside a brief liturgy including a reading from the [employer], a recitation of the 23rd Psalm, a Prayer of Commendation and a prayer for the family.  Chaplain advised family to call throughout the night if there was additional need.  [place]

## 2019-08-27 DEATH — deceased

## 2020-02-06 IMAGING — DX CHEST - 2 VIEW
2 series · 3 of 3 positions shown · non-contrast
Comparison: 08/02/2018

CLINICAL DATA: Weakness, altered level of consciousness

EXAM:
CHEST - 2 VIEW

[chest lat]
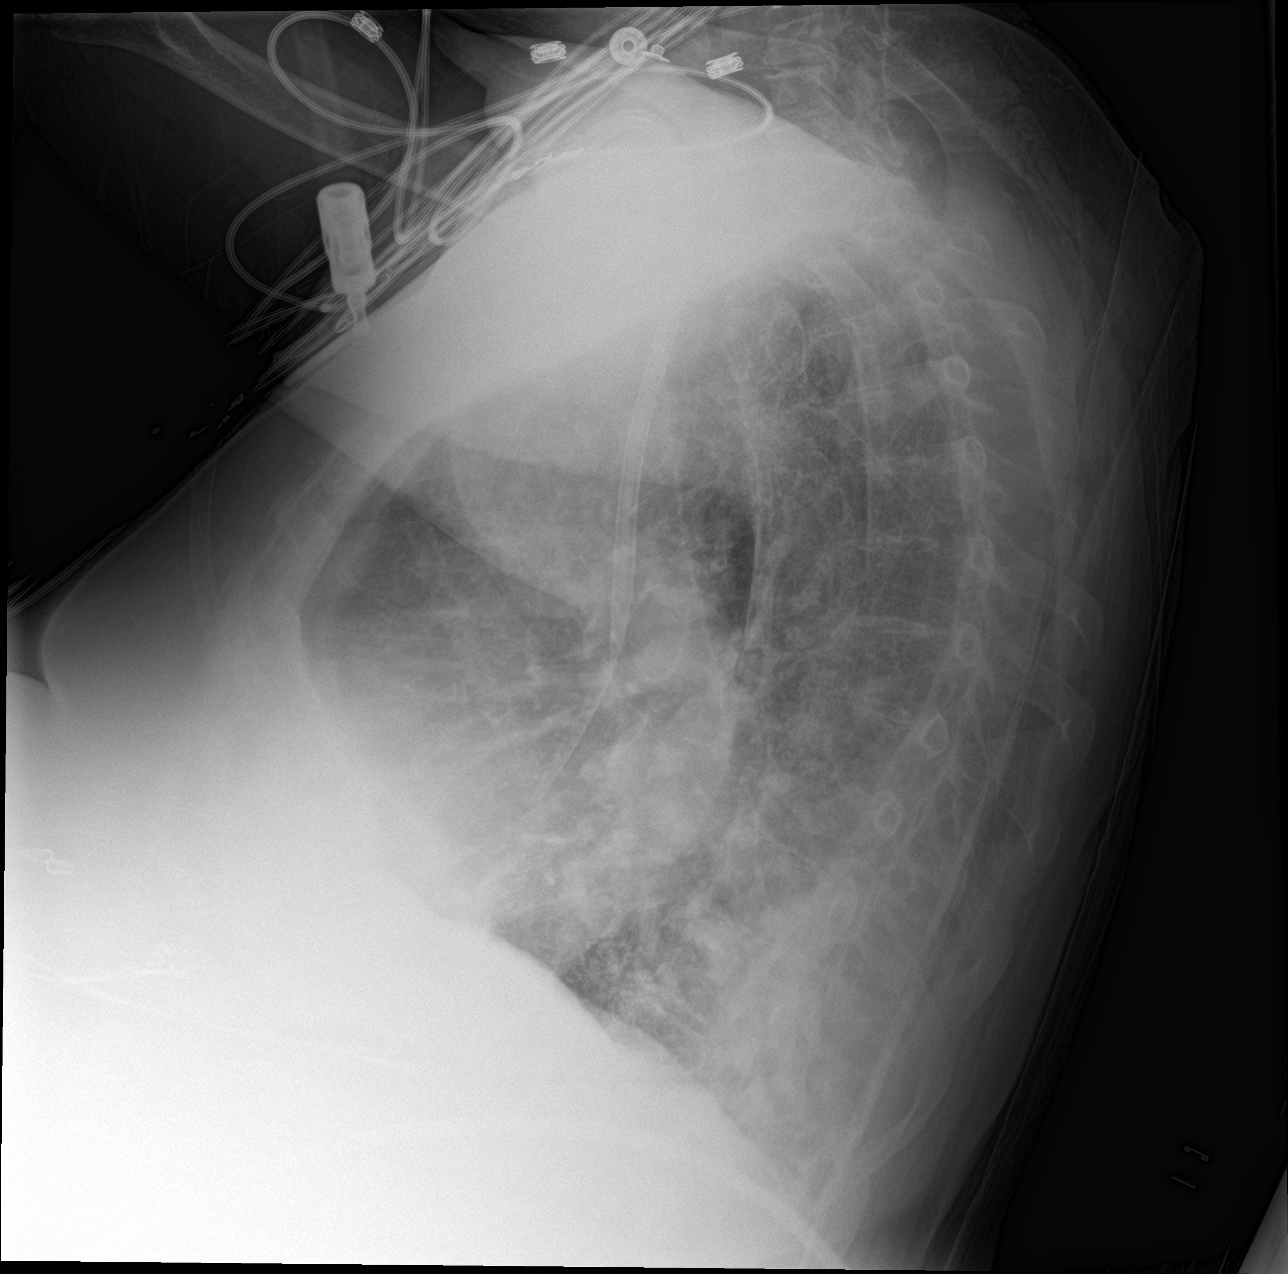

[Series 3: chest ap · 0.14mm/px · 2 of 2 slices shown]
[im 1/2]
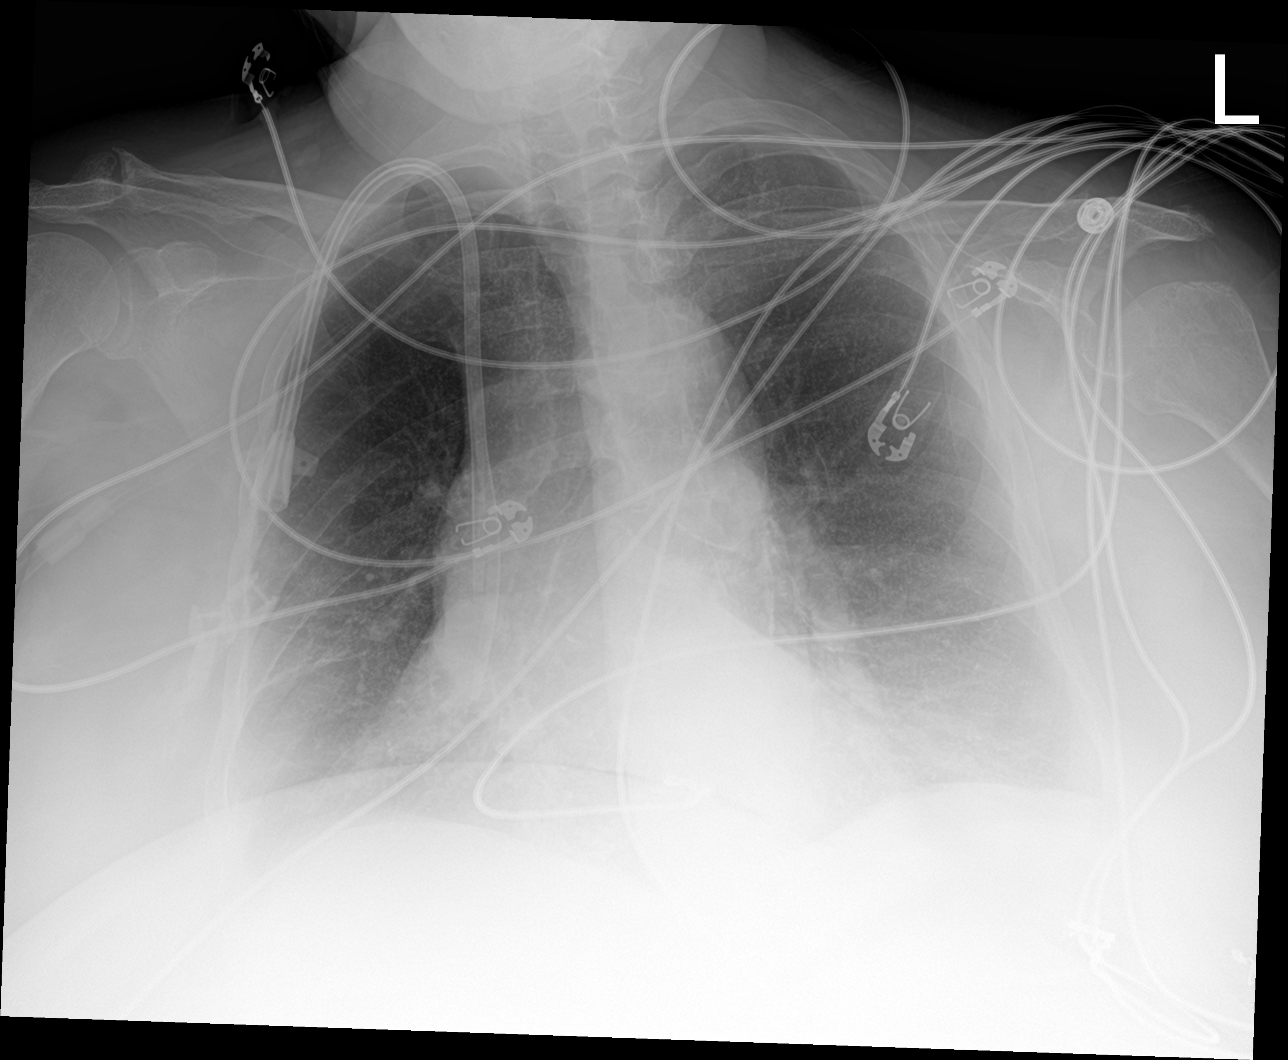
[im 2/2]
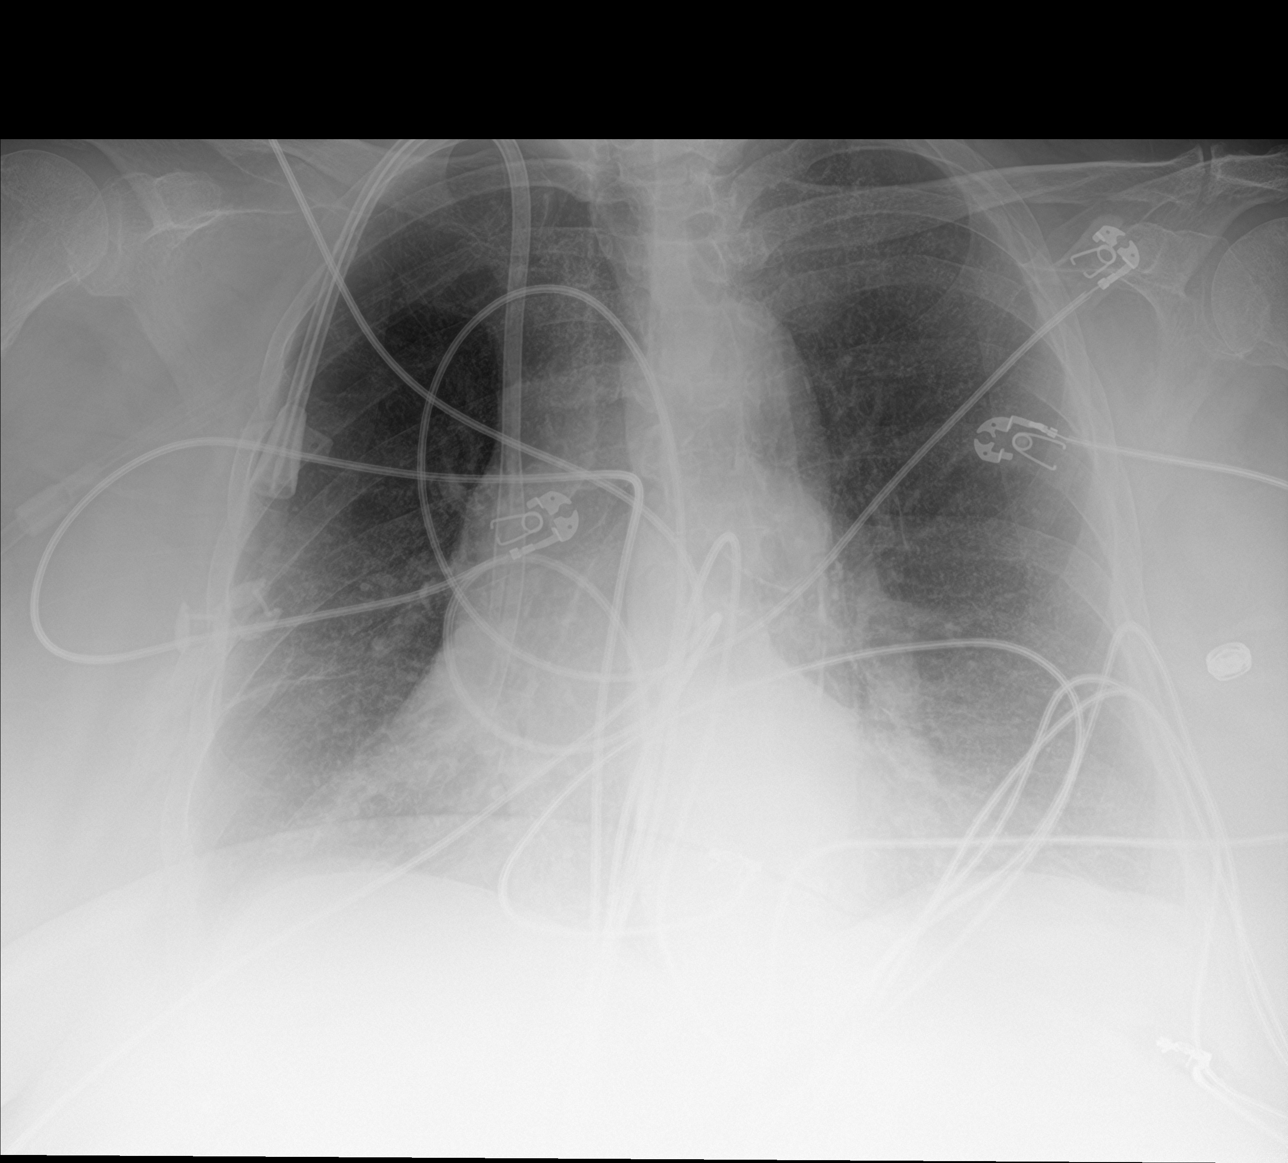

[3 of 3 positions shown; findings below may reference images not displayed]

FINDINGS: Lungs are clear.  No pleural effusion or pneumothorax.

Cardiomegaly. Right IJ dual lumen dialysis catheter terminates in
the upper right atrium.

Mild degenerative changes of the visualized thoracolumbar spine.
IMPRESSION: Normal chest radiographs.

## 2020-02-06 IMAGING — CT CT HEAD WITHOUT CONTRAST
4 series · 16 of 47 positions shown, 18 images · non-contrast
Comparison: None.

CLINICAL DATA: Altered level of consciousness.

EXAM:
CT HEAD WITHOUT CONTRAST
TECHNIQUE: Contiguous axial images were obtained from the base of the skull
through the vertex without intravenous contrast.

[Series 3: head without · axial · non-contrast · 0.43mm/px · z∈[-92,+38]mm · 7 of 36 slices shown, 9 images]
[im 5/36  brain]
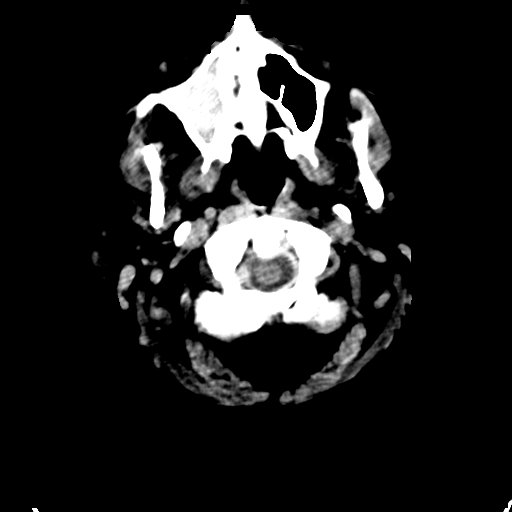
[im 5/36  bone]
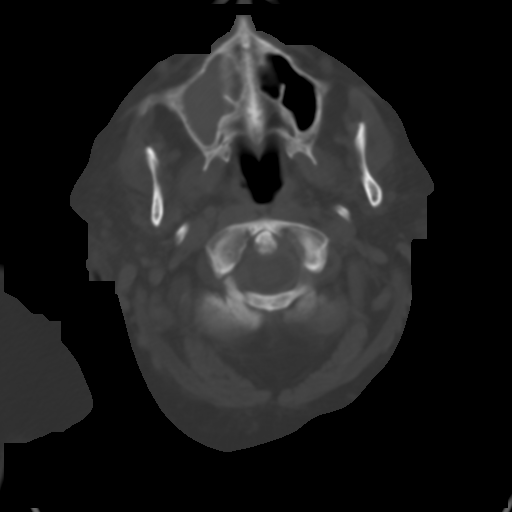
[im 9/36  brain]
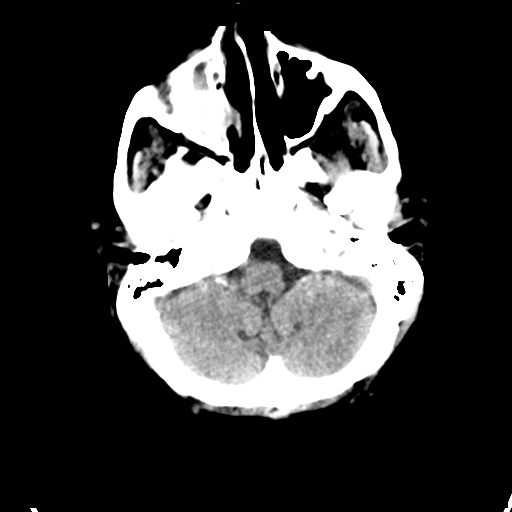
[im 14/36  brain]
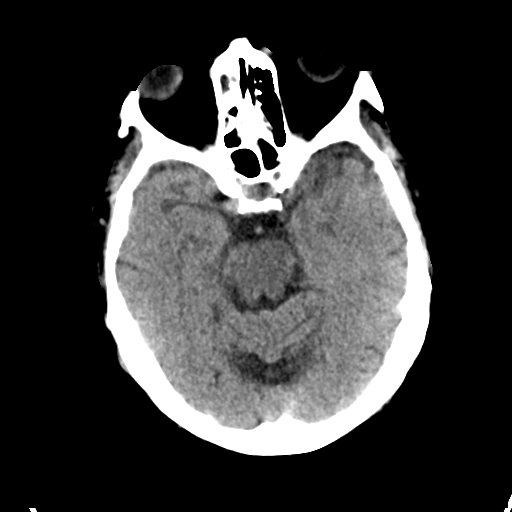
[im 18/36  brain]
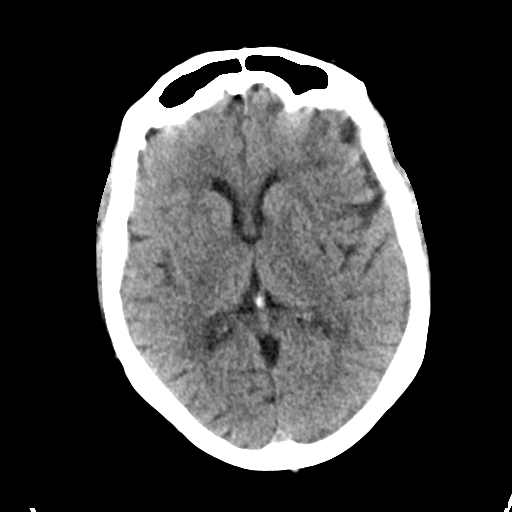
[im 22/36  brain]
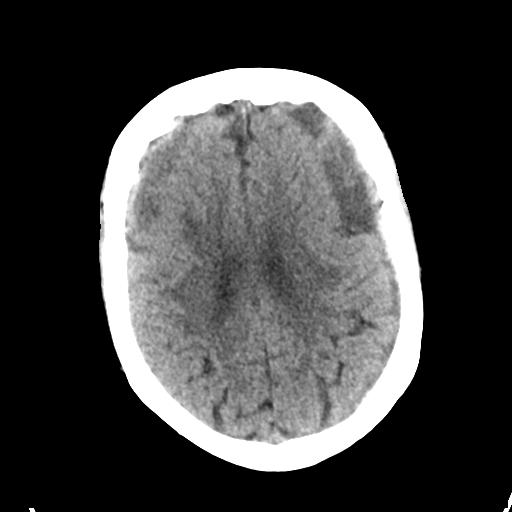
[im 22/36  bone]
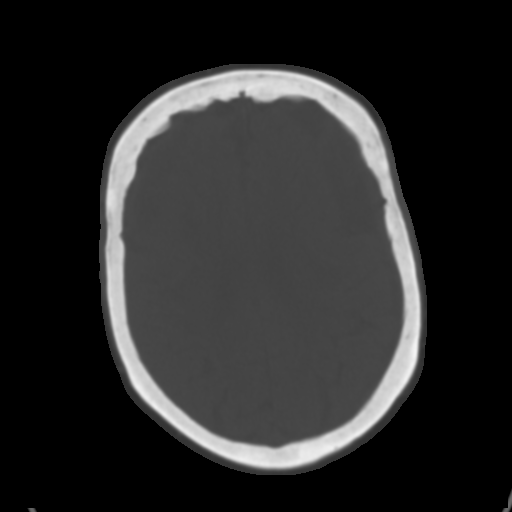
[im 27/36  brain]
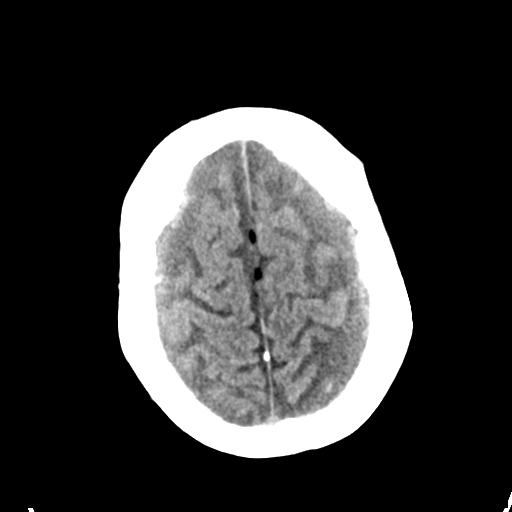
[im 31/36  brain]
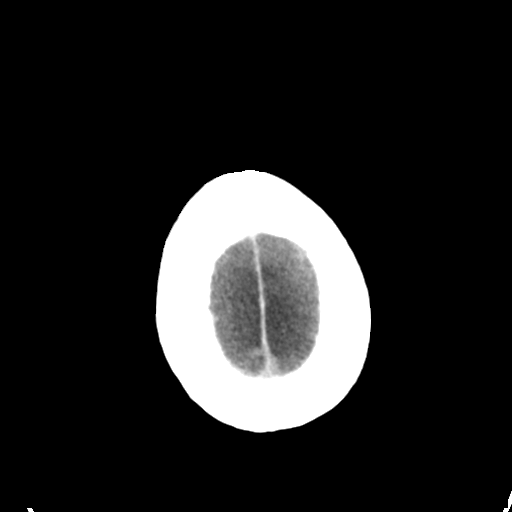

[Series 4: head bone · axial · 0.43mm/px · z∈[-96,-60]mm · 3 of 90 slices shown]
[im 9/90  bone]
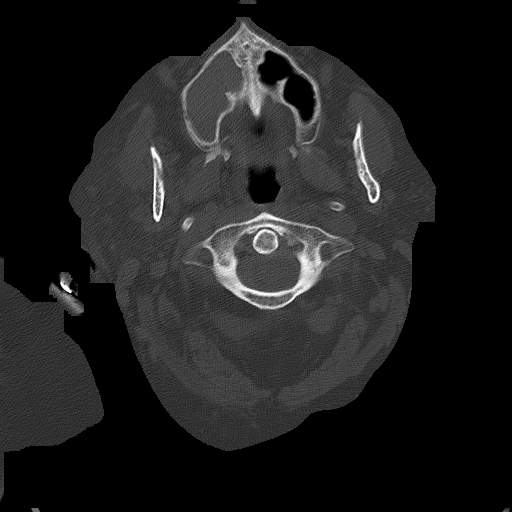
[im 18/90  bone]
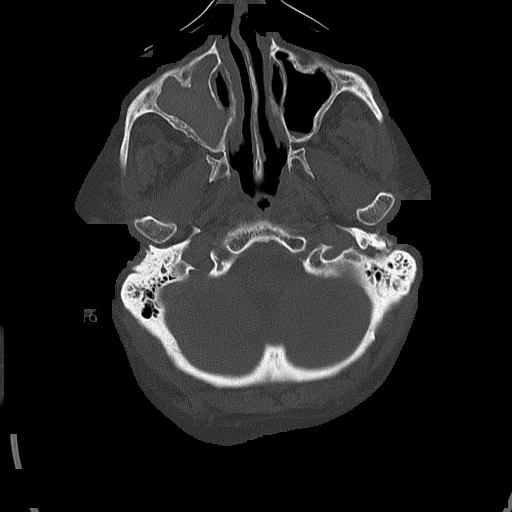
[im 27/90  bone]
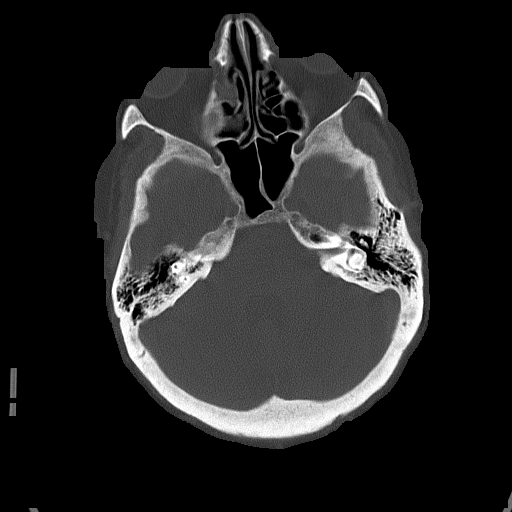

[Series 5: head without cor · coronal · non-contrast · 0.33mm/px · 3 of 67 slices shown]
[im 23/67  brain]
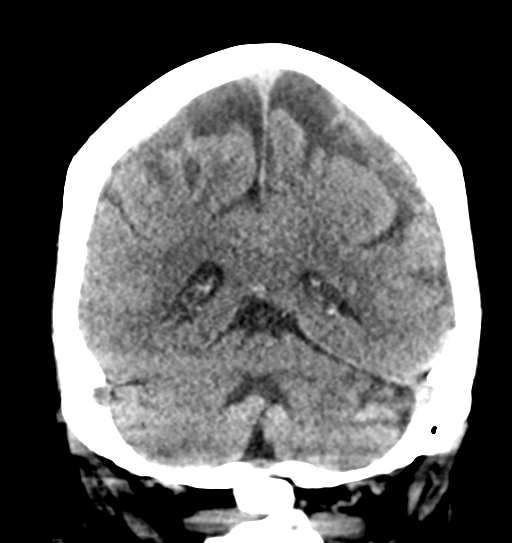
[im 30/67  brain]
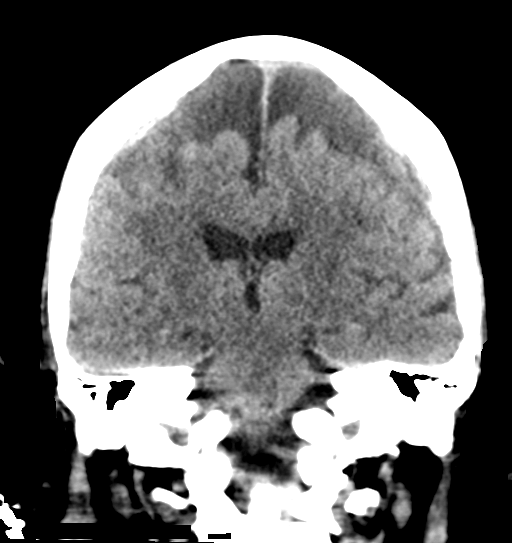
[im 37/67  brain]
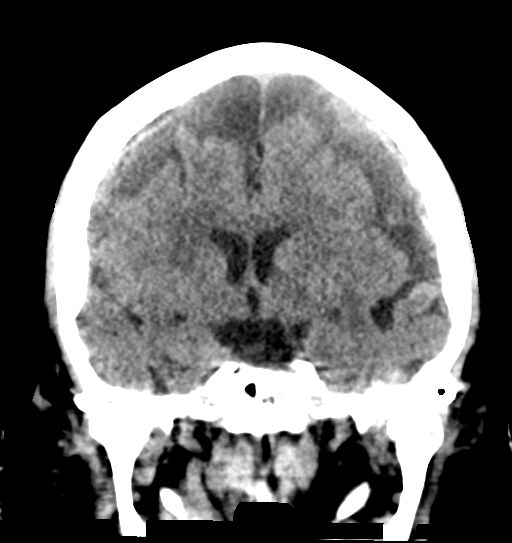

[Series 6: head without sag · sagittal · non-contrast · 0.32mm/px · 3 of 60 slices shown]
[im 20/60  brain]
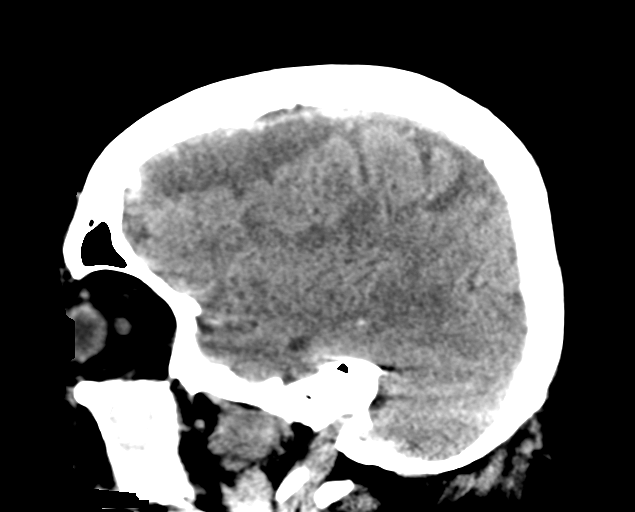
[im 30/60  brain]
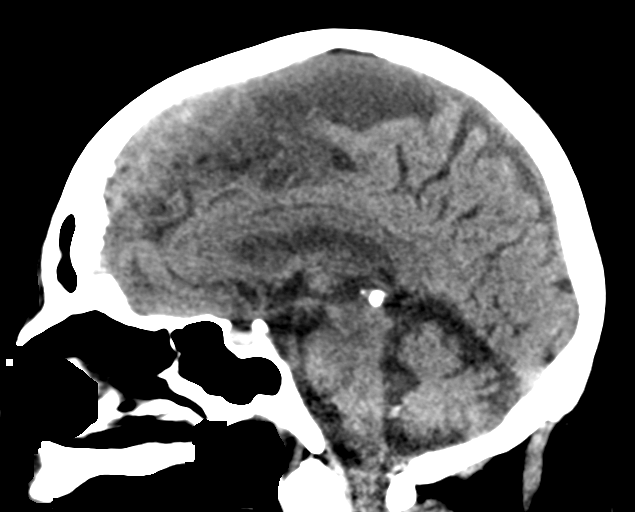
[im 40/60  brain]
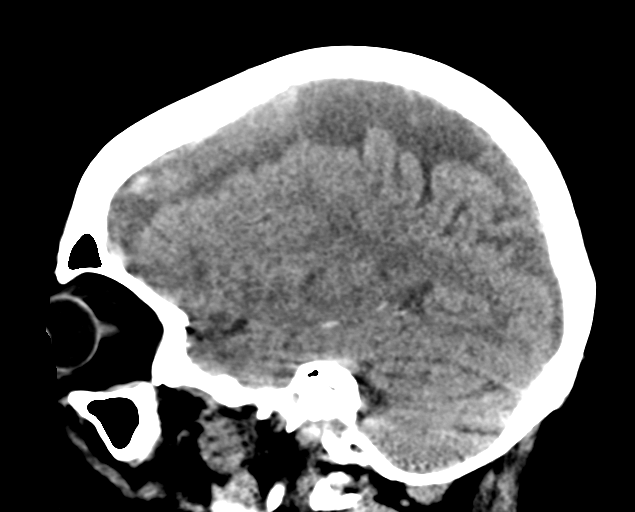

[16 of 47 positions shown; findings below may reference images not displayed]

FINDINGS: Brain: Bilateral extra-axial fluid collections compatible with
subdural hematomas that appears subacute to chronic. Majority of the
fluid is low-density. There are small areas of higher density.
Multiple septations are seen within the fluid. Right-sided
collection measures approximately 14 mm and left sided collection
measures 15 mm. Minimal midline shift to the right.

Negative for hydrocephalus. No acute infarct. Chronic ischemic
change in the white matter.

Vascular: Negative for hyperdense vessel

Skull: Negative

Sinuses/Orbits: Mucosal edema paranasal sinuses most notably right
frontal ethmoid and maxillary sinuses. Bony thickening of the
maxillary sinus bilaterally

Other: None
IMPRESSION: 15 mm bilateral subdural hematomas which appears subacute to
chronic. Majority of the fluid is low-density without evidence of
acute bleeding. Minimal midline shift to the right.

These results were called by telephone at the time of interpretation
on 08/22/2018 at [DATE] to Dr. TAKIA BERGERON , who verbally
acknowledged these results.

## 2020-08-09 IMAGING — DX DG CHEST 1V PORT
1 series · 1 of 1 positions shown · non-contrast
Comparison: Radiograph 02/22/2019

CLINICAL DATA: Respiratory failure, unable to communicate

EXAM:
PORTABLE CHEST 1 VIEW

[chest]
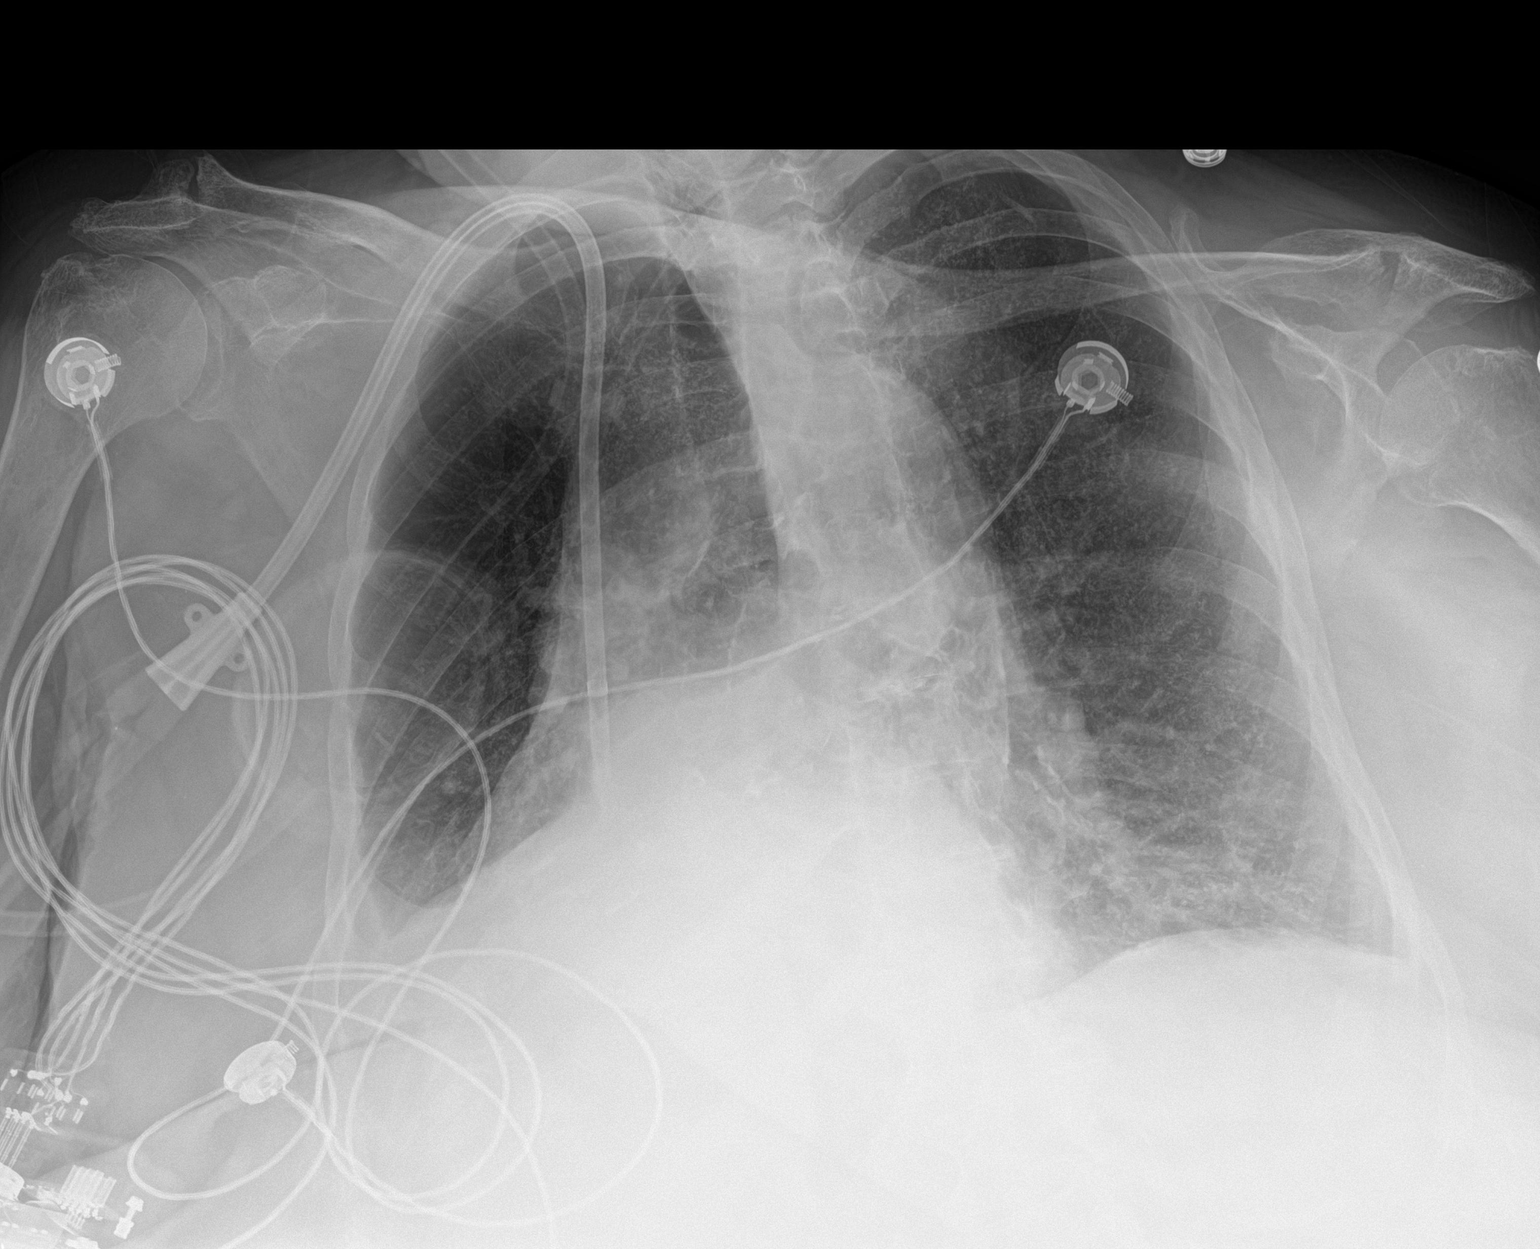

[1 of 1 positions shown; findings below may reference images not displayed]

FINDINGS: There is a steep left anterior oblique rotation of the patient. A
right dual lumen dialysis catheter tip terminates at the level of
the right atrium. Nasal cannula and telemetry leads overlie the
chest. There is persistent right basilar effusion and atelectasis.
Fine reticular opacities throughout the lungs may reflect some
interstitial edematous changes superimposed upon emphysema better
seen on comparison CT. No pneumothorax. Cardiomediastinal contours
are stable from prior accounting for rotation. No acute osseous or
soft tissue abnormality. Degenerative changes are present in the
imaged spine and shoulders.
IMPRESSION: Stable right basilar effusion and atelectasis.

Fine reticular opacities throughout the lungs may reflect some
interstitial edematous changes superimposed upon emphysema.
